# Patient Record
Sex: Female | Born: 1945 | Race: White | Hispanic: No | Smoking: Former smoker
Health system: Southern US, Community
[De-identification: ages and names within clinical notes are randomized; demographics above are authoritative.]

## PROBLEM LIST (undated history)

## (undated) DIAGNOSIS — J383 Other diseases of vocal cords: Secondary | ICD-10-CM

## (undated) DIAGNOSIS — L97919 Non-pressure chronic ulcer of unspecified part of right lower leg with unspecified severity: Secondary | ICD-10-CM

## (undated) DIAGNOSIS — D219 Benign neoplasm of connective and other soft tissue, unspecified: Secondary | ICD-10-CM

## (undated) DIAGNOSIS — L97929 Non-pressure chronic ulcer of unspecified part of left lower leg with unspecified severity: Secondary | ICD-10-CM

## (undated) DIAGNOSIS — IMO0002 Reserved for concepts with insufficient information to code with codable children: Secondary | ICD-10-CM

## (undated) DIAGNOSIS — J9610 Chronic respiratory failure, unspecified whether with hypoxia or hypercapnia: Secondary | ICD-10-CM

## (undated) DIAGNOSIS — IMO0001 Reserved for inherently not codable concepts without codable children: Secondary | ICD-10-CM

## (undated) DIAGNOSIS — D069 Carcinoma in situ of cervix, unspecified: Secondary | ICD-10-CM

## (undated) DIAGNOSIS — J449 Chronic obstructive pulmonary disease, unspecified: Secondary | ICD-10-CM

## (undated) DIAGNOSIS — F329 Major depressive disorder, single episode, unspecified: Secondary | ICD-10-CM

## (undated) DIAGNOSIS — F32A Depression, unspecified: Secondary | ICD-10-CM

## (undated) DIAGNOSIS — D649 Anemia, unspecified: Secondary | ICD-10-CM

## (undated) DIAGNOSIS — E785 Hyperlipidemia, unspecified: Secondary | ICD-10-CM

## (undated) DIAGNOSIS — D638 Anemia in other chronic diseases classified elsewhere: Secondary | ICD-10-CM

## (undated) DIAGNOSIS — I2510 Atherosclerotic heart disease of native coronary artery without angina pectoris: Secondary | ICD-10-CM

## (undated) DIAGNOSIS — C50919 Malignant neoplasm of unspecified site of unspecified female breast: Secondary | ICD-10-CM

## (undated) DIAGNOSIS — Z87891 Personal history of nicotine dependence: Secondary | ICD-10-CM

## (undated) DIAGNOSIS — N184 Chronic kidney disease, stage 4 (severe): Secondary | ICD-10-CM

## (undated) DIAGNOSIS — E119 Type 2 diabetes mellitus without complications: Secondary | ICD-10-CM

## (undated) DIAGNOSIS — F0390 Unspecified dementia without behavioral disturbance: Secondary | ICD-10-CM

## (undated) DIAGNOSIS — I5032 Chronic diastolic (congestive) heart failure: Secondary | ICD-10-CM

## (undated) HISTORY — DX: Malignant neoplasm of unspecified site of unspecified female breast: C50.919

## (undated) HISTORY — DX: Non-pressure chronic ulcer of unspecified part of left lower leg with unspecified severity: L97.929

## (undated) HISTORY — DX: Reserved for concepts with insufficient information to code with codable children: IMO0002

## (undated) HISTORY — DX: Carcinoma in situ of cervix, unspecified: D06.9

## (undated) HISTORY — DX: Anemia, unspecified: D64.9

## (undated) HISTORY — DX: Hyperlipidemia, unspecified: E78.5

## (undated) HISTORY — DX: Reserved for inherently not codable concepts without codable children: IMO0001

## (undated) HISTORY — DX: Morbid (severe) obesity due to excess calories: E66.01

## (undated) HISTORY — DX: Non-pressure chronic ulcer of unspecified part of left lower leg with unspecified severity: L97.919

---

## 2001-08-25 ENCOUNTER — Encounter: Payer: Self-pay | Admitting: Emergency Medicine

## 2001-08-25 ENCOUNTER — Emergency Department (HOSPITAL_COMMUNITY): Admission: EM | Admit: 2001-08-25 | Discharge: 2001-08-25 | Payer: Self-pay | Admitting: Emergency Medicine

## 2001-10-15 ENCOUNTER — Encounter: Admission: RE | Admit: 2001-10-15 | Discharge: 2001-11-05 | Payer: Self-pay | Admitting: Orthopedic Surgery

## 2006-12-02 ENCOUNTER — Emergency Department (HOSPITAL_COMMUNITY): Admission: EM | Admit: 2006-12-02 | Discharge: 2006-12-02 | Payer: Self-pay | Admitting: Emergency Medicine

## 2011-05-01 ENCOUNTER — Inpatient Hospital Stay (HOSPITAL_COMMUNITY): Payer: Self-pay

## 2011-05-01 ENCOUNTER — Inpatient Hospital Stay (HOSPITAL_COMMUNITY)
Admission: AD | Admit: 2011-05-01 | Discharge: 2011-05-01 | Disposition: A | Discharge status: Home / Self Care | Payer: Self-pay | Source: Ambulatory Visit | Attending: Obstetrics & Gynecology | Admitting: Obstetrics & Gynecology

## 2011-05-01 DIAGNOSIS — N9489 Other specified conditions associated with female genital organs and menstrual cycle: Secondary | ICD-10-CM

## 2011-05-01 DIAGNOSIS — N888 Other specified noninflammatory disorders of cervix uteri: Secondary | ICD-10-CM | POA: Insufficient documentation

## 2011-05-01 DIAGNOSIS — N950 Postmenopausal bleeding: Secondary | ICD-10-CM | POA: Insufficient documentation

## 2011-05-01 DIAGNOSIS — R9389 Abnormal findings on diagnostic imaging of other specified body structures: Secondary | ICD-10-CM | POA: Insufficient documentation

## 2011-05-01 DIAGNOSIS — D259 Leiomyoma of uterus, unspecified: Secondary | ICD-10-CM | POA: Insufficient documentation

## 2011-05-01 DIAGNOSIS — N858 Other specified noninflammatory disorders of uterus: Secondary | ICD-10-CM

## 2011-05-01 DIAGNOSIS — N838 Other noninflammatory disorders of ovary, fallopian tube and broad ligament: Secondary | ICD-10-CM | POA: Insufficient documentation

## 2011-05-01 LAB — CBC
HCT: 39.2 % (ref 36.0–46.0)
Hemoglobin: 12.9 g/dL (ref 12.0–15.0)
MCH: 27 pg (ref 26.0–34.0)
MCHC: 32.9 g/dL (ref 30.0–36.0)
MCV: 82 fL (ref 78.0–100.0)
Platelets: 271 10*3/uL (ref 150–400)
RBC: 4.78 MIL/uL (ref 3.87–5.11)
RDW: 15.5 % (ref 11.5–15.5)
WBC: 12 10*3/uL — ABNORMAL HIGH (ref 4.0–10.5)

## 2011-05-01 NOTE — Progress Notes (Signed)
Pt states she woke up in a pool of blood

## 2011-05-01 NOTE — ED Provider Notes (Signed)
History   The pt is a 65 y o female who presents to [facility]U reporting post-menopausal bleeding. She is accompanied by her daughter. The pt is a poor historian and has received limited healthcare in the past ~13 years. She lives with her husband. Her daughter estimates that the pts last gyn exam/Pap was 13 years ago. The pt states she multiple abnl Paps, but does not know what was done about them.  CSN: [id number] Arrival date & time: 05/01/2011  1:02 AM  Chief Complaint  Patient presents with  . Vaginal Bleeding   Vaginal Bleeding The patient's primary symptoms include vaginal bleeding. The patient's pertinent negatives include no pelvic pain. This is a new problem. Episode onset: woke up in a pool of blood 2 days ago, spotting  2 months ago. The problem has been waxing and waning. The patient is experiencing no pain. She is not pregnant. Pertinent negatives include no abdominal pain, chills, constipation, diarrhea, dysuria, fever, frequency, nausea, urgency or vomiting. The vaginal bleeding is heavier than menses. She has been passing clots. She has not been passing tissue. The symptoms are aggravated by nothing. She has tried nothing for the symptoms. She is not sexually active. It is unknown whether or not her partner has an STD. Contraceptive Use: post-menopausal. She is postmenopausal. Her past medical history is significant for a gynecological surgery (endometrial polyps).    No past medical history on file.  No past surgical history on file.  No family history on file.  Meds: none  History  Substance Use Topics  . Smoking status: Not on file  . Smokeless tobacco: Not on file  . Alcohol Use: Not on file    OB History    No data available      Review of Systems  Constitutional: Positive for unexpected weight change (gain). Negative for fever, chills, appetite change and fatigue.  Respiratory: Negative for cough, chest tightness and shortness of breath.   Cardiovascular: Negative  for palpitations.  Gastrointestinal: Negative for nausea, vomiting, abdominal pain, diarrhea, constipation, blood in stool, abdominal distention and anal bleeding.  Genitourinary: Positive for vaginal bleeding. Negative for dysuria, urgency, frequency, difficulty urinating, vaginal pain and pelvic pain.  Neurological: Negative for syncope and weakness.  Hematological: Does not bruise/bleed easily.    Physical Exam  BP 151/78  Pulse 92  Temp(Src) 98.4 F (36.9 C) (Oral)  Resp 18  Ht 5\' 2"  (1.575 m)  Wt 96.616 kg (213 lb)  BMI 38.96 kg/m2 Patient Vitals for the past 24 hrs:  BP Temp Temp src Pulse Resp Height Weight  05/01/11 0310 134/66 mmHg - - 99  20  - -  05/01/11 0307 137/74 mmHg - - 91  - - -  05/01/11 0305 148/63 mmHg - - 86  - - -  05/01/11 0150 151/78 mmHg - - 92  18  - -  05/01/11 0122 - 98.4 F (36.9 C) Oral - 20  5\' 2"  (1.575 m) 96.616 kg (213 lb)    Physical Exam  Constitutional: Vital signs are normal. She appears well-developed and well-nourished. No distress.       Poor hygene, disheveled appearance.   HENT:  Head: Normocephalic and atraumatic.  Cardiovascular: Normal rate.   Pulmonary/Chest: Effort normal.  Abdominal: Soft. She exhibits distension (mildly) and mass (possible low-abd mass. Difficult to assess due to body habitus.). There is no tenderness.  Genitourinary: Rectal exam shows no external hemorrhoid.    Uterus is enlarged (possibly--difficult to assess due to body  habitus) and fixed. Uterus is not tender. Cervix exhibits no motion tenderness. Right adnexum displays no tenderness and no fullness. Left adnexum displays no mass and no tenderness. There is bleeding (mod brb) around the vagina.       Multiple masses possibly arising from the cervix. One cystic-appearing mass.  Neurological: She is alert.  Skin: Skin is warm and dry.  Psychiatric: Cognition and memory are impaired.   *RN observed large gush of blood when pt stood after [place].  CBC      Component Value Date/Time   WBC 12.0* 05/01/2011 0135   RBC 4.78 05/01/2011 0135   HGB 12.9 05/01/2011 0135   HCT 39.2 05/01/2011 0135   PLT 271 05/01/2011 0135   MCV 82.0 05/01/2011 0135   MCH 27.0 05/01/2011 0135   MCHC 32.9 05/01/2011 0135   RDW 15.5 05/01/2011 0135    *RADIOLOGY REPORT*  Clinical Data: Post menopausal bleeding  TRANSABDOMINAL AND TRANSVAGINAL ULTRASOUND OF PELVIS  Technique: Both transabdominal and transvaginal ultrasound  examinations of the pelvis were performed. Transabdominal technique  was performed for global imaging of the pelvis including uterus,  ovaries, adnexal regions, and pelvic cul-de-sac.  Comparison: None.  It was necessary to proceed with endovaginal exam following the  transabdominal exam to visualize the uterus and ovaries.  Findings:  Uterus: The uterus measures 6.5 x 4.9 x 4.5 cm. Heterogeneous  myometrial echotexture with myometrial masses demonstrated  consistent with fibroids. Small calcifications are present. The  largest of these is on the left and measures about 1.7 x 1.5 x 1.7  cm. The uterus appears retroverted. The cervix is not well  visualized.  Endometrium: Endometrial stripe thickness is mildly prominent,  measuring about 1.2 cm. However, the endometrium is heterogeneous  with multiple cystic and solid regions demonstrated. Endometrial  flow is demonstrated on color flow Doppler images. The central  solid mass measuring about 1.1 cm diameter is suggested. Features  are worrisome for endometrial hyperplasia, polyp, or neoplasm.  Right ovary: The right ovary measures about 3.5 x 2 x 2.2 cm and  contains a simple appearing cyst measuring about 1.9 x 1.9 x 1.5  cm. Flow is demonstrated in the right ovary on color flow Doppler  images.  Inferior to the right ovary, there is a complex hypoechoic cystic  structure measuring about 1.8 x 1.5 x 1.7 cm. No flow is  demonstrated on color flow Doppler imaging. This does not appear  to be related to  the uterus or ovary and it is of uncertain  origin.It appears represent a complex or hemorrhagic cyst although  it represent enlarged lymphnode.  Left ovary: The left ovary is not visualized.  Other findings: Trace fluid in the pelvis and right adnexal region.  IMPRESSION:  1. Multiple myometrial leiomyomas of the uterus.  2. Heterogeneous appearing endometrium with cystic and solid  structures present. Changes are worrisome for endometrial  hyperplasia, polyp, or neoplasm.  3. Simple appearing cyst in the right ovary.  4. Complex hypoechoic structure inferior to the right adnexa of  uncertain etiology or origin.  5. Left ovary not visualized.  6. Trace free fluid in the pelvis.  Original Report Authenticated By: Marlon Pel, M.D.  ED Course  Procedures  Assessment: 1. Post-menopausal bleeding, hemodynamically stable 2. Cervical masses  3. Endometrial masses worrisome for endometrial  hyperplasia, polyp, or neoplasm.  4. Fibroids 5. Right adnexal mass  Plan: 1. D/C home per consult w/ Dr. Macon Large 2. Will schedule F/U ASAP in  Gyn clinic 3. Discussed findings w/ pt and daughter including concern for possible cancer. Stressed need to F/U AS 4. Return to MAU PRN for symptomatic heavy bleeding.

## 2011-05-07 NOTE — ED Provider Notes (Signed)
Attestation of Attending Supervision of Advanced Practitioner: Evaluation and management procedures were performed by the PA/NP/CNM/OB Fellow under my supervision/collaboration. Chart reviewed and agree with management and plan.  Amillion Macchia A 05/07/2011 9:48 PM   

## 2011-05-30 ENCOUNTER — Other Ambulatory Visit (HOSPITAL_COMMUNITY)
Admission: RE | Admit: 2011-05-30 | Discharge: 2011-05-30 | Disposition: A | Discharge status: Home / Self Care | Payer: Self-pay | Source: Ambulatory Visit | Attending: Obstetrics and Gynecology | Admitting: Obstetrics and Gynecology

## 2011-05-30 ENCOUNTER — Other Ambulatory Visit: Payer: Self-pay | Admitting: Obstetrics and Gynecology

## 2011-05-30 ENCOUNTER — Encounter: Payer: Self-pay | Admitting: Obstetrics and Gynecology

## 2011-05-30 ENCOUNTER — Encounter (HOSPITAL_COMMUNITY): Payer: Self-pay | Admitting: *Deleted

## 2011-05-30 ENCOUNTER — Ambulatory Visit (INDEPENDENT_AMBULATORY_CARE_PROVIDER_SITE_OTHER): Payer: Self-pay | Admitting: Obstetrics and Gynecology

## 2011-05-30 ENCOUNTER — Inpatient Hospital Stay (HOSPITAL_COMMUNITY)
Admission: AD | Admit: 2011-05-30 | Discharge: 2011-05-30 | Disposition: A | Discharge status: Home / Self Care | Payer: Self-pay | Source: Ambulatory Visit | Attending: Obstetrics & Gynecology | Admitting: Obstetrics & Gynecology

## 2011-05-30 VITALS — BP 142/82 | HR 76 | Temp 97.2°F | Ht 62.0 in | Wt 207.2 lb

## 2011-05-30 DIAGNOSIS — N898 Other specified noninflammatory disorders of vagina: Secondary | ICD-10-CM

## 2011-05-30 DIAGNOSIS — N949 Unspecified condition associated with female genital organs and menstrual cycle: Secondary | ICD-10-CM | POA: Insufficient documentation

## 2011-05-30 DIAGNOSIS — N938 Other specified abnormal uterine and vaginal bleeding: Secondary | ICD-10-CM | POA: Insufficient documentation

## 2011-05-30 DIAGNOSIS — N939 Abnormal uterine and vaginal bleeding, unspecified: Secondary | ICD-10-CM

## 2011-05-30 DIAGNOSIS — Z01419 Encounter for gynecological examination (general) (routine) without abnormal findings: Secondary | ICD-10-CM

## 2011-05-30 DIAGNOSIS — D069 Carcinoma in situ of cervix, unspecified: Secondary | ICD-10-CM | POA: Insufficient documentation

## 2011-05-30 DIAGNOSIS — N950 Postmenopausal bleeding: Secondary | ICD-10-CM

## 2011-05-30 HISTORY — DX: Benign neoplasm of connective and other soft tissue, unspecified: D21.9

## 2011-05-30 LAB — CBC
HCT: 37.1 % (ref 36.0–46.0)
MCHC: 32.1 g/dL (ref 30.0–36.0)
MCV: 81.2 fL (ref 78.0–100.0)
RDW: 15.8 % — ABNORMAL HIGH (ref 11.5–15.5)

## 2011-05-30 LAB — COMPREHENSIVE METABOLIC PANEL
Albumin: 3.6 g/dL (ref 3.5–5.2)
BUN: 23 mg/dL (ref 6–23)
Calcium: 9.4 mg/dL (ref 8.4–10.5)
Creatinine, Ser: 1.02 mg/dL (ref 0.50–1.10)
Total Bilirubin: 0.2 mg/dL — ABNORMAL LOW (ref 0.3–1.2)
Total Protein: 7 g/dL (ref 6.0–8.3)

## 2011-05-30 LAB — POCT URINALYSIS DIP (DEVICE)
Ketones, ur: NEGATIVE mg/dL
Protein, ur: NEGATIVE mg/dL
Specific Gravity, Urine: <=1.005 (ref 1.005–1.030)
pH: 5 (ref 5.0–8.0)

## 2011-05-30 MED ORDER — LACTATED RINGERS IV SOLN
INTRAVENOUS | Status: DC
  Administered 2011-05-30: 18:00:00 via INTRAVENOUS

## 2011-05-30 NOTE — ED Provider Notes (Signed)
Patient was seen by me.  Above note is noted.  All scans and labs and previous notes were reviewed.  On exam, it appears to me that patient has endometrial carcinoma prolapsing through the cervix.  The tissue looks worse than simply a necrotic polyp or myoma.  Additionally the cervix itself looks fine just dilated due to the prolapsing masses.  Dr. Okey Dupre obtained sample in clinic and sent off to pathology.    Hemostasis was obtained using a large amount of Monsel's solution and pressure which stopped the bleeding.  I let patient sit up for 30 minutes and she had no further bleeding.  As a result patient will be discharged home to be seen in Gyn clinic here at [facility] on Monday 06/04/2011 at 1:30 PM to go over biopsy report.  I informed patient i think it is probably a malignancy and discussed options if that is the case.  She understands.

## 2011-05-30 NOTE — ED Provider Notes (Signed)
History     CSN: [id number] Arrival date & time: 05/30/2011  5:06 PM  No chief complaint on file.   HPI Anita [name] is a 65 y.o. female sent to MAU from GYN clinic for heavy vaginal bleeding after Dr. Okey Dupre was doing a endometrial biopsy. She is in stable condition.  No past medical history on file.  No past surgical history on file.  Family History  Problem Relation Age of Onset  . Cancer Mother     leukemia  . Hypertension Mother   . Diabetes Father   . Heart disease Father     passed away due to heart attack    History  Substance Use Topics  . Smoking status: Current Everyday Smoker -- 1.0 packs/day for 25 years    Types: Cigarettes  . Smokeless tobacco: Never Used  . Alcohol Use: No    OB History    Grav Para Term Preterm Abortions TAB SAB Ect Mult Living                  Review of Systems  Constitutional: Negative for fever, chills, diaphoresis and fatigue.  HENT: Negative for ear pain, congestion, sore throat, facial swelling, neck pain, neck stiffness, dental problem and sinus pressure.   Eyes: Negative for photophobia, pain and discharge.  Respiratory: Positive for shortness of breath. Negative for cough, chest tightness and wheezing.   Cardiovascular: Positive for leg swelling.  Gastrointestinal: Positive for abdominal pain. Negative for nausea, vomiting, diarrhea, constipation and abdominal distention.  Genitourinary: Positive for vaginal bleeding. Negative for dysuria, frequency, flank pain and difficulty urinating.  Musculoskeletal: Positive for back pain. Negative for myalgias and gait problem.  Skin: Negative for color change and rash.  Neurological: Negative for dizziness, speech difficulty, weakness, light-headedness, numbness and headaches.  Psychiatric/Behavioral: Negative for confusion and agitation.    Allergies  Review of patient's allergies indicates no known allergies.  Home Medications  No current outpatient prescriptions on  file.  There were no vitals taken for this visit.  Physical Exam  Nursing note and vitals reviewed. Constitutional: She is oriented to person, place, and time. She appears well-developed and well-nourished. No distress.  HENT:  Head: Normocephalic.  Eyes: EOM are normal.  Neck: Neck supple.  Pulmonary/Chest: No respiratory distress.       rhonchi bilateral  Abdominal: Soft. There is no tenderness.       obese  Musculoskeletal: Normal range of motion.  Neurological: She is alert and oriented to person, place, and time. No cranial nerve deficit.  Skin: Skin is warm and dry.    ED Course  Procedures   MDM   Results for orders placed during the hospital encounter of 05/30/11 (from the past 24 hour(s))  CBC     Status: Abnormal   Collection Time   05/30/11  5:12 PM      Component Value Range   WBC 8.9  4.0 - 10.5 (K/uL)   RBC 4.57  3.87 - 5.11 (MIL/uL)   Hemoglobin 11.9 (*) 12.0 - 15.0 (g/dL)   HCT 40.9  81.1 - 91.4 (%)   MCV 81.2  78.0 - 100.0 (fL)   MCH 26.0  26.0 - 34.0 (pg)   MCHC 32.1  30.0 - 36.0 (g/dL)   RDW 78.2 (*) 95.6 - 15.5 (%)   Platelets 368  150 - 400 (K/uL)  COMPREHENSIVE METABOLIC PANEL     Status: Abnormal   Collection Time   05/30/11  5:12 PM  Component Value Range   Sodium 137  135 - 145 (mEq/L)   Potassium 3.7  3.5 - 5.1 (mEq/L)   Chloride 101  96 - 112 (mEq/L)   CO2 29  19 - 32 (mEq/L)   Glucose, Bld 115 (*) 70 - 99 (mg/dL)   BUN 23  6 - 23 (mg/dL)   Creatinine, Ser 1.61  0.50 - 1.10 (mg/dL)   Calcium 9.4  8.4 - 09.6 (mg/dL)   Total Protein 7.0  6.0 - 8.3 (g/dL)   Albumin 3.6  3.5 - 5.2 (g/dL)   AST 18  0 - 37 (U/L)   ALT 15  0 - 35 (U/L)   Alkaline Phosphatase 98  39 - 117 (U/L)   Total Bilirubin 0.2 (*) 0.3 - 1.2 (mg/dL)   GFR calc non Af Amer 57 (*) >90 (mL/min)   GFR calc Af Amer 66 (*) >90 (mL/min)   Assessment: Abnormal vaginal bleeding Plan: Dr. Despina Hidden notified that patient's labs are back and she is ready for further  evaluation.     [place], [place] 05/30/11 [phone]

## 2011-05-30 NOTE — ED Notes (Signed)
Brought up from clinic via wc. Fibroid removed in clinic- was coming through cervix,started bleeding heavily. Registered and taken to rm.

## 2011-05-30 NOTE — Progress Notes (Signed)
The patient is a 65 year old white female with no bleeding for 10 years wet started bleeding vaginally on Saturday past went into the emergency room on Sunday and was referred here to be seen today. The visit in emergency room was not to revealing except for an ultrasound showed a normal-size uterus with a thickened endometrium and some small submucous fibroid. She stopped bleeding yesterday. Patient apparently had history of several abnormal Pap smears but hasn't had one in about 10 years.  Examination: On inserting the speculum at first I thought she had a complete uterine prolapse but then it appeared that this was either an aborting fibroid or a polyp measuring about 4 cm in diameter. Palpating I could feel the entrance to the cervix above this polyp was therefore on the posterior entrance into the cervix and the cervix appeared to be dilated bleeding was getting heavy at this time so I removed the polyp by torsion painlessly. It measured about 3.5 x 3.5 x 1 cm. Was then noted that the bleeding continued and that there was another mass in the area so rather than continue this here in the clinic I packed the vagina speculum. Dr. Algis Downs. that was notified that the patient should be admitted for [facility] and removal of endomass.

## 2011-05-30 NOTE — Progress Notes (Signed)
Pt is not bleeding currently and does not want to do endo bx if could cause bleeding to start again.

## 2011-06-22 ENCOUNTER — Ambulatory Visit (INDEPENDENT_AMBULATORY_CARE_PROVIDER_SITE_OTHER): Payer: Self-pay | Admitting: Obstetrics and Gynecology

## 2011-06-22 ENCOUNTER — Encounter: Payer: Self-pay | Admitting: Obstetrics and Gynecology

## 2011-06-22 VITALS — BP 157/88 | HR 89 | Temp 96.8°F | Wt 206.6 lb

## 2011-06-22 DIAGNOSIS — D069 Carcinoma in situ of cervix, unspecified: Secondary | ICD-10-CM

## 2011-06-22 DIAGNOSIS — N871 Moderate cervical dysplasia: Secondary | ICD-10-CM

## 2011-06-22 NOTE — Progress Notes (Signed)
Patient is a 65 year old white female gravida 2 para 200 to. Proximately 10-12 years postmenopausal who came in with postmenopausal bleeding 2 weeks ago. On examination I saw a large cervical mass as well as 2 small or masses. These appeared to be aborting fibroids or endocervical polyps. I did an endometrial biopsy and a Pap smear both of which came back normal. 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 Bleeding ensued which were unable to control here I sent her to the [facility]. and Dr. Bridgette Habermann was able to control it with Monsel solution. He returns today for further examination.:  Examination: Speculum: 2 remaining cervical masses remaining. One measures about 2-1/2-3 cm x 2 cm. A second measures about 1.5 x 1.5 cm.  Plan: Have the patient return as soon as possible for surgical consult for D&C and removal of cervical lesions.  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 2 remaining cervical masses still present.

## 2011-08-23 ENCOUNTER — Ambulatory Visit: Payer: Self-pay | Admitting: Obstetrics and Gynecology

## 2011-10-10 ENCOUNTER — Ambulatory Visit: Payer: Self-pay | Admitting: Obstetrics & Gynecology

## 2011-10-24 ENCOUNTER — Ambulatory Visit: Payer: Self-pay | Admitting: Obstetrics & Gynecology

## 2011-11-07 ENCOUNTER — Encounter: Payer: Self-pay | Admitting: Obstetrics & Gynecology

## 2011-11-07 ENCOUNTER — Ambulatory Visit (INDEPENDENT_AMBULATORY_CARE_PROVIDER_SITE_OTHER): Payer: Self-pay | Admitting: Obstetrics & Gynecology

## 2011-11-07 VITALS — BP 157/92 | HR 83 | Temp 96.8°F | Resp 20 | Ht 60.5 in | Wt 205.7 lb

## 2011-11-07 DIAGNOSIS — D069 Carcinoma in situ of cervix, unspecified: Secondary | ICD-10-CM

## 2011-11-07 NOTE — Progress Notes (Signed)
  Subjective:    Patient ID: Anita [name], female    DOB: [birth year], 66 y.o.   MRN: [id number]  HPI  Anita [name] is a 66 yo lady who was seen here in the clinic 10/12. Dr. Okey [name] removed what appeared to be a prolapsing fibroid and had hemorrhage. She was sent to the MAU and was treated with Monsels. The pathology came back as CIN 3, although her pap that day was normal. She never came back for another appt until now.   Review of Systems     Objective:   Physical Exam  Prolapsing masses form cervical os, appearing very friable/vascular      Assessment & Plan:  Although she initially declined surgery, I have convinced her that her risk of not having surgery is greater than her fear of surgery/$. 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 She understands that any delay can worsen her prognosis.

## 2011-11-07 NOTE — Progress Notes (Signed)
Pt would like to discuss surgical alternatives.

## 2011-11-28 ENCOUNTER — Telehealth: Payer: Self-pay | Admitting: *Deleted

## 2011-11-28 NOTE — Telephone Encounter (Signed)
Pt left message on nurse voice mail for Dr. Marice Potter that she has had a second opinion and is cancelling her surgery on 5/15. She further stated that "Y'all are too quick to have someone go under the knife. I don't want to go under no one's knife that fast. I'm just going to wait and chill for awhile." Message will be sent to Dr. Marice Potter regarding pt's desire to cancel surgery.

## 2011-11-29 NOTE — Telephone Encounter (Signed)
After hearing from Diane Day that Anita [name] canceled her surgery, I called the patient and left the patient a message saying that any delay in treatment can shorten her life. I also told her I would be happy to reschedule her at her convenience.

## 2012-01-09 ENCOUNTER — Ambulatory Visit: Admit: 2012-01-09 | Payer: Self-pay | Admitting: Obstetrics & Gynecology

## 2012-01-09 SURGERY — CONE BIOPSY, CERVIX
Anesthesia: Choice | Site: Vagina

## 2012-01-09 SURGERY — Surgical Case
Anesthesia: *Unknown

## 2012-09-24 ENCOUNTER — Emergency Department (HOSPITAL_COMMUNITY): Payer: Medicare Other

## 2012-09-24 ENCOUNTER — Inpatient Hospital Stay (HOSPITAL_COMMUNITY): Payer: Medicare Other

## 2012-09-24 ENCOUNTER — Encounter (HOSPITAL_COMMUNITY): Payer: Self-pay | Admitting: Emergency Medicine

## 2012-09-24 ENCOUNTER — Inpatient Hospital Stay (HOSPITAL_COMMUNITY)
Admission: EM | Admit: 2012-09-24 | Discharge: 2012-10-02 | DRG: 280 | Disposition: A | Discharge status: Home / Self Care | Payer: Medicare Other | Attending: Family Medicine | Admitting: Family Medicine

## 2012-09-24 DIAGNOSIS — R609 Edema, unspecified: Secondary | ICD-10-CM

## 2012-09-24 DIAGNOSIS — Z6841 Body Mass Index (BMI) 40.0 and over, adult: Secondary | ICD-10-CM

## 2012-09-24 DIAGNOSIS — D649 Anemia, unspecified: Secondary | ICD-10-CM | POA: Diagnosis present

## 2012-09-24 DIAGNOSIS — I2789 Other specified pulmonary heart diseases: Secondary | ICD-10-CM | POA: Diagnosis present

## 2012-09-24 DIAGNOSIS — J441 Chronic obstructive pulmonary disease with (acute) exacerbation: Secondary | ICD-10-CM

## 2012-09-24 DIAGNOSIS — N189 Chronic kidney disease, unspecified: Secondary | ICD-10-CM | POA: Diagnosis present

## 2012-09-24 DIAGNOSIS — L03319 Cellulitis of trunk, unspecified: Secondary | ICD-10-CM | POA: Diagnosis present

## 2012-09-24 DIAGNOSIS — Z22322 Carrier or suspected carrier of Methicillin resistant Staphylococcus aureus: Secondary | ICD-10-CM

## 2012-09-24 DIAGNOSIS — K7689 Other specified diseases of liver: Secondary | ICD-10-CM | POA: Diagnosis present

## 2012-09-24 DIAGNOSIS — N179 Acute kidney failure, unspecified: Secondary | ICD-10-CM

## 2012-09-24 DIAGNOSIS — Z720 Tobacco use: Secondary | ICD-10-CM

## 2012-09-24 DIAGNOSIS — J811 Chronic pulmonary edema: Secondary | ICD-10-CM

## 2012-09-24 DIAGNOSIS — L97309 Non-pressure chronic ulcer of unspecified ankle with unspecified severity: Secondary | ICD-10-CM

## 2012-09-24 DIAGNOSIS — R601 Generalized edema: Secondary | ICD-10-CM

## 2012-09-24 DIAGNOSIS — I5033 Acute on chronic diastolic (congestive) heart failure: Secondary | ICD-10-CM | POA: Diagnosis present

## 2012-09-24 DIAGNOSIS — R4182 Altered mental status, unspecified: Secondary | ICD-10-CM

## 2012-09-24 DIAGNOSIS — L02219 Cutaneous abscess of trunk, unspecified: Secondary | ICD-10-CM | POA: Diagnosis present

## 2012-09-24 DIAGNOSIS — R7989 Other specified abnormal findings of blood chemistry: Secondary | ICD-10-CM

## 2012-09-24 DIAGNOSIS — F17200 Nicotine dependence, unspecified, uncomplicated: Secondary | ICD-10-CM | POA: Diagnosis present

## 2012-09-24 DIAGNOSIS — J9600 Acute respiratory failure, unspecified whether with hypoxia or hypercapnia: Secondary | ICD-10-CM

## 2012-09-24 DIAGNOSIS — R0989 Other specified symptoms and signs involving the circulatory and respiratory systems: Secondary | ICD-10-CM

## 2012-09-24 DIAGNOSIS — E662 Morbid (severe) obesity with alveolar hypoventilation: Secondary | ICD-10-CM

## 2012-09-24 DIAGNOSIS — G4733 Obstructive sleep apnea (adult) (pediatric): Secondary | ICD-10-CM | POA: Diagnosis present

## 2012-09-24 DIAGNOSIS — D72829 Elevated white blood cell count, unspecified: Secondary | ICD-10-CM | POA: Diagnosis not present

## 2012-09-24 DIAGNOSIS — R0902 Hypoxemia: Secondary | ICD-10-CM

## 2012-09-24 DIAGNOSIS — IMO0002 Reserved for concepts with insufficient information to code with codable children: Secondary | ICD-10-CM

## 2012-09-24 DIAGNOSIS — I219 Acute myocardial infarction, unspecified: Secondary | ICD-10-CM

## 2012-09-24 DIAGNOSIS — E119 Type 2 diabetes mellitus without complications: Secondary | ICD-10-CM

## 2012-09-24 DIAGNOSIS — E876 Hypokalemia: Secondary | ICD-10-CM | POA: Diagnosis not present

## 2012-09-24 DIAGNOSIS — J9620 Acute and chronic respiratory failure, unspecified whether with hypoxia or hypercapnia: Secondary | ICD-10-CM

## 2012-09-24 DIAGNOSIS — I509 Heart failure, unspecified: Secondary | ICD-10-CM

## 2012-09-24 DIAGNOSIS — J9692 Respiratory failure, unspecified with hypercapnia: Secondary | ICD-10-CM

## 2012-09-24 DIAGNOSIS — L97909 Non-pressure chronic ulcer of unspecified part of unspecified lower leg with unspecified severity: Secondary | ICD-10-CM | POA: Diagnosis present

## 2012-09-24 DIAGNOSIS — I959 Hypotension, unspecified: Secondary | ICD-10-CM

## 2012-09-24 DIAGNOSIS — N63 Unspecified lump in unspecified breast: Secondary | ICD-10-CM | POA: Diagnosis present

## 2012-09-24 DIAGNOSIS — J449 Chronic obstructive pulmonary disease, unspecified: Secondary | ICD-10-CM

## 2012-09-24 DIAGNOSIS — I214 Non-ST elevation (NSTEMI) myocardial infarction: Principal | ICD-10-CM

## 2012-09-24 HISTORY — DX: Chronic obstructive pulmonary disease, unspecified: J44.9

## 2012-09-24 LAB — CBC WITH DIFFERENTIAL/PLATELET
Basophils Absolute: 0 10*3/uL (ref 0.0–0.1)
Basophils Relative: 0 % (ref 0–1)
Eosinophils Absolute: 0 10*3/uL (ref 0.0–0.7)
Eosinophils Relative: 0 % (ref 0–5)
HCT: 30.2 % — ABNORMAL LOW (ref 36.0–46.0)
Hemoglobin: 8.9 g/dL — ABNORMAL LOW (ref 12.0–15.0)
Lymphocytes Relative: 10 % — ABNORMAL LOW (ref 12–46)
Lymphs Abs: 0.9 10*3/uL (ref 0.7–4.0)
MCH: 25.6 pg — ABNORMAL LOW (ref 26.0–34.0)
MCHC: 29.5 g/dL — ABNORMAL LOW (ref 30.0–36.0)
MCV: 87 fL (ref 78.0–100.0)
Monocytes Absolute: 0.5 10*3/uL (ref 0.1–1.0)
Monocytes Relative: 6 % (ref 3–12)
Neutro Abs: 7.6 10*3/uL (ref 1.7–7.7)
Neutrophils Relative %: 84 % — ABNORMAL HIGH (ref 43–77)
Platelets: 172 10*3/uL (ref 150–400)
RBC: 3.47 MIL/uL — ABNORMAL LOW (ref 3.87–5.11)
RDW: 25.2 % — ABNORMAL HIGH (ref 11.5–15.5)
WBC: 9 10*3/uL (ref 4.0–10.5)

## 2012-09-24 LAB — POCT I-STAT 3, ART BLOOD GAS (G3+)
Acid-base deficit: 1 mmol/L (ref 0.0–2.0)
Acid-base deficit: 1 mmol/L (ref 0.0–2.0)
Bicarbonate: 27.4 mEq/L — ABNORMAL HIGH (ref 20.0–24.0)
Patient temperature: 99.2
TCO2: 29 mmol/L (ref 0–100)
pCO2 arterial: 46.9 mmHg — ABNORMAL HIGH (ref 35.0–45.0)
pH, Arterial: 7.329 — ABNORMAL LOW (ref 7.350–7.450)
pO2, Arterial: 89 mmHg (ref 80.0–100.0)

## 2012-09-24 LAB — COMPREHENSIVE METABOLIC PANEL
AST: 57 U/L — ABNORMAL HIGH (ref 0–37)
Albumin: 2.9 g/dL — ABNORMAL LOW (ref 3.5–5.2)
Alkaline Phosphatase: 238 U/L — ABNORMAL HIGH (ref 39–117)
BUN: 43 mg/dL — ABNORMAL HIGH (ref 6–23)
Chloride: 107 mEq/L (ref 96–112)
Potassium: 4.3 mEq/L (ref 3.5–5.1)
Total Bilirubin: 0.6 mg/dL (ref 0.3–1.2)

## 2012-09-24 LAB — MRSA PCR SCREENING: MRSA by PCR: POSITIVE — AB

## 2012-09-24 LAB — POCT I-STAT TROPONIN I

## 2012-09-24 LAB — PRO B NATRIURETIC PEPTIDE: Pro B Natriuretic peptide (BNP): 17719 pg/mL — ABNORMAL HIGH (ref 0–125)

## 2012-09-24 LAB — URINALYSIS, MICROSCOPIC ONLY
Glucose, UA: NEGATIVE mg/dL
Ketones, ur: NEGATIVE mg/dL
Protein, ur: 100 mg/dL — AB

## 2012-09-24 LAB — TROPONIN I: Troponin I: 2.45 ng/mL (ref <0.30)

## 2012-09-24 MED ORDER — HEPARIN (PORCINE) IN NACL 100-0.45 UNIT/ML-% IJ SOLN
1000.0000 [IU]/h | INTRAMUSCULAR | Status: DC
  Administered 2012-09-24: 1000 [IU]/h via INTRAVENOUS
  Filled 2012-09-24 (×2): qty 250

## 2012-09-24 MED ORDER — HEPARIN BOLUS VIA INFUSION
4000.0000 [IU] | Freq: Once | INTRAVENOUS | Status: AC
  Administered 2012-09-24: 4000 [IU] via INTRAVENOUS

## 2012-09-24 MED ORDER — ALBUTEROL SULFATE (5 MG/ML) 0.5% IN NEBU: 2.5000 mg | INHALATION_SOLUTION | RESPIRATORY_TRACT | Status: DC | PRN

## 2012-09-24 MED ORDER — CHLORHEXIDINE GLUCONATE 0.12 % MT SOLN
15.0000 mL | Freq: Two times a day (BID) | OROMUCOSAL | Status: DC
  Administered 2012-09-24 – 2012-10-01 (×13): 15 mL via OROMUCOSAL
  Filled 2012-09-24 (×16): qty 15

## 2012-09-24 MED ORDER — PREDNISONE 50 MG PO TABS
60.0000 mg | ORAL_TABLET | Freq: Every day | ORAL | Status: DC
  Administered 2012-09-25 – 2012-09-27 (×3): 60 mg via ORAL
  Filled 2012-09-24 (×4): qty 1

## 2012-09-24 MED ORDER — MUPIROCIN 2 % EX OINT
1.0000 "application " | TOPICAL_OINTMENT | Freq: Two times a day (BID) | CUTANEOUS | Status: AC
  Administered 2012-09-24 – 2012-09-29 (×9): 1 via NASAL
  Filled 2012-09-24: qty 22

## 2012-09-24 MED ORDER — BIOTENE DRY MOUTH MT LIQD
15.0000 mL | Freq: Two times a day (BID) | OROMUCOSAL | Status: DC
  Administered 2012-09-25 – 2012-10-02 (×14): 15 mL via OROMUCOSAL

## 2012-09-24 MED ORDER — ASPIRIN 81 MG PO CHEW
324.0000 mg | CHEWABLE_TABLET | Freq: Once | ORAL | Status: AC
  Administered 2012-09-24: 324 mg via ORAL
  Filled 2012-09-24: qty 4
  Filled 2012-09-24: qty 3

## 2012-09-24 MED ORDER — SODIUM CHLORIDE 0.9 % IJ SOLN
3.0000 mL | Freq: Two times a day (BID) | INTRAMUSCULAR | Status: DC
  Administered 2012-09-26 – 2012-09-29 (×5): 3 mL via INTRAVENOUS
  Administered 2012-09-29: 09:00:00 via INTRAVENOUS
  Administered 2012-09-30 – 2012-10-02 (×5): 3 mL via INTRAVENOUS

## 2012-09-24 MED ORDER — METHYLPREDNISOLONE SODIUM SUCC 125 MG IJ SOLR
125.0000 mg | Freq: Once | INTRAMUSCULAR | Status: AC
  Administered 2012-09-24: 125 mg via INTRAVENOUS
  Filled 2012-09-24: qty 2

## 2012-09-24 MED ORDER — CHLORHEXIDINE GLUCONATE CLOTH 2 % EX PADS
6.0000 | MEDICATED_PAD | Freq: Every day | CUTANEOUS | Status: AC
  Administered 2012-09-25 – 2012-09-29 (×5): 6 via TOPICAL

## 2012-09-24 MED ORDER — FUROSEMIDE 10 MG/ML IJ SOLN
80.0000 mg | Freq: Once | INTRAMUSCULAR | Status: AC
  Administered 2012-09-24: 80 mg via INTRAVENOUS
  Filled 2012-09-24: qty 8

## 2012-09-24 MED ORDER — ALBUTEROL SULFATE (5 MG/ML) 0.5% IN NEBU
2.5000 mg | INHALATION_SOLUTION | RESPIRATORY_TRACT | Status: DC
  Administered 2012-09-24 – 2012-09-25 (×4): 2.5 mg via RESPIRATORY_TRACT
  Filled 2012-09-24 (×4): qty 0.5

## 2012-09-24 MED ORDER — ACETAMINOPHEN 650 MG RE SUPP: 650.0000 mg | Freq: Four times a day (QID) | RECTAL | Status: DC | PRN

## 2012-09-24 MED ORDER — ASPIRIN EC 81 MG PO TBEC
81.0000 mg | DELAYED_RELEASE_TABLET | Freq: Every day | ORAL | Status: DC
  Administered 2012-09-26 – 2012-10-02 (×7): 81 mg via ORAL
  Filled 2012-09-24 (×8): qty 1

## 2012-09-24 MED ORDER — SODIUM CHLORIDE 0.9 % IJ SOLN: 3.0000 mL | INTRAMUSCULAR | Status: DC | PRN

## 2012-09-24 MED ORDER — FUROSEMIDE 10 MG/ML IJ SOLN
80.0000 mg | Freq: Two times a day (BID) | INTRAMUSCULAR | Status: DC
  Administered 2012-09-24: 80 mg via INTRAVENOUS
  Filled 2012-09-24 (×3): qty 8

## 2012-09-24 MED ORDER — LEVOFLOXACIN IN D5W 250 MG/50ML IV SOLN
250.0000 mg | INTRAVENOUS | Status: DC
  Filled 2012-09-24: qty 50

## 2012-09-24 MED ORDER — SODIUM CHLORIDE 0.9 % IJ SOLN
3.0000 mL | Freq: Two times a day (BID) | INTRAMUSCULAR | Status: DC
  Administered 2012-09-24 – 2012-09-26 (×4): 3 mL via INTRAVENOUS

## 2012-09-24 MED ORDER — SODIUM CHLORIDE 0.9 % IV SOLN
250.0000 mL | INTRAVENOUS | Status: DC | PRN
  Administered 2012-09-24: 250 mL via INTRAVENOUS

## 2012-09-24 MED ORDER — LEVOFLOXACIN IN D5W 500 MG/100ML IV SOLN
500.0000 mg | INTRAVENOUS | Status: AC
  Administered 2012-09-24: 500 mg via INTRAVENOUS
  Filled 2012-09-24: qty 100

## 2012-09-24 MED ORDER — IPRATROPIUM BROMIDE 0.02 % IN SOLN
0.5000 mg | RESPIRATORY_TRACT | Status: DC
  Administered 2012-09-24 – 2012-09-30 (×34): 0.5 mg via RESPIRATORY_TRACT
  Filled 2012-09-24 (×34): qty 2.5

## 2012-09-24 MED ORDER — ACETAMINOPHEN 325 MG PO TABS: 650.0000 mg | ORAL_TABLET | Freq: Four times a day (QID) | ORAL | Status: DC | PRN

## 2012-09-24 NOTE — Consult Note (Signed)
PULMONARY  / CRITICAL CARE MEDICINE  Name: Anita [name] MRN: [id number] DOB: [birth year]    ADMISSION DATE:  09/24/2012 CONSULTATION DATE:  1/29  REFERRING MD :  Dr. Clinton Sawyer - [facility]  CHIEF COMPLAINT:  Acute Respiratory Insufficiency  BRIEF PATIENT DESCRIPTION: 67 y/o WF, heavy smoker, with PMH obesity who presented to [facility] on 1/29 with 3-4 month history of progressively worsening shortness of breath.  Notes progressive weight gain, LE edema, and worsening of SOB in last week.  ER work up noted CHF, Acute Renal Failure, NSTEMI, Hypercarbic Respiratory Failure.  Placed on bipap with improvement in respiratory symptoms.    SIGNIFICANT EVENTS / STUDIES:  1/29 - Admit with CHF, Acute Renal Failure, NSTEMI, Hypercarbic Respiratory Failure.  LINES / TUBES:   CULTURES: 1/29 Flu>>> 1/29 BCx2>>> 1/29 UA>>>  ANTIBIOTICS: 1/29 Levaquin>>>  HISTORY OF PRESENT ILLNESS:  67 y/o WF, heavy smoker, with PMH of obesity who presented to [facility] ER on 1/29 with 3-4 month history of progressively worsening shortness of breath.  Notes progressive weight gain, LE edema, and worsening of SOB in last week prior to presentation.  Activated EMS and was noted to have saturations of 84% on 4L / [place].  Was placed on bipap per EMS.  Initially was somnolent but responded to noxious stimuli.  ER work up noted CHF, Acute Renal Failure, NSTEMI, Hypercarbic Respiratory Failure.  Negative lactic acid & cardiomegaly on CXR.  Improvement in respiratory symptoms and ABG with bipap.  Notes dry, non-productive cough, inability to lie flat, unable to smoke cigarettes due to SOB.  Denies chest pain, nausea, vomiting, diarrhea, sputum production, pain with inspiration.  Denies home medications or health problems other than "female issues".    PAST MEDICAL HISTORY :  Past Medical History  Diagnosis Date  . Fibroid   . Morbid obesity   . 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 No pertinent past surgical history. Prior to Admission medications   Not on File   Allergies  Allergen Reactions  . Benzene Rash    FAMILY HISTORY:  Family History  Problem Relation Age of Onset  . Cancer Mother     leukemia  . Hypertension Mother   . Diabetes Father   . Heart disease Father     passed away due to heart attack   SOCIAL HISTORY:  reports that she has been smoking Cigarettes.  She has a 50 pack-year smoking history. She has never used smokeless tobacco. She reports that she does not drink alcohol or use illicit drugs.  REVIEW OF SYSTEMS:   All systems reviewed - pertinent positives in HPI.   SUBJECTIVE:  Reports shortness of breath improved on bipap  VITAL SIGNS: Temp:  [97.5 F (36.4 C)-99.2 F (37.3 C)] 97.5 F (36.4 C) (01/29 1524) Pulse Rate:  [86-99] 86  (01/29 1714) Resp:  [22-30] 25  (01/29 1714) BP: (94-120)/(43-59) 101/49 mmHg (01/29 1557) SpO2:  [24 %-98 %] 91 % (01/29 1714)  HEMODYNAMICS:    VENTILATOR SETTINGS:    INTAKE / OUTPUT: Intake/Output    None     PHYSICAL EXAMINATION: General:  Morbidly obese, chronically ill appearing Neuro:  Drowsy but awakens and is appropriate, MAE HEENT:  Mm pink/moist, thick/ short neck Cardiovascular:  s1s2 rrr, no m/r/g Lungs:  resp's even/non-labored on bipap, lungs bilaterally with wheezing / crackles Abdomen:  Obese/soft, bsx4 active Musculoskeletal:  No acute deformities Skin:  Warm/dry, BLE erythema, small pinpoint ulcerations, changes c/w with chronic venous stasis  ASSESSMENT AND PLAN  PULMONARY  Lab 09/24/12 1541 09/24/12 1314  PHART 7.329* 7.202*  PCO2ART 46.9* 70.0*  PO2ART 89.0 46.0*  HCO3 24.8* 27.4*  O2SAT 96.0 70.0   Ventilator Settings:   CXR:  1/29 Pulmonary edema and hiatal hernia. ETT:  None  A:  Multifactorial respiratory failure due to COPD, morbid obesity, likely pulmonary HTN for OSA/OHV and pulmonary edema as well as deconditioning.   Patient has evidence of a troponin leak, under different circumstances I would intubate her for respiratory failure but patient is quickly improving with BiPAP and diureses that I suspect patient will be off BiPAP within the next few hours as lasix takes effect. P:   - Levaquin for CAP coverage and airway sterilization. - Diureses as BP and renal function permits. - Duoneb QID. - PRN albuterol. - Influenza PCR. - Would hold off tamiflu as the patient had none of the classic symptoms. - Solumedrol. - Pan culture.  CARDIOVASCULAR  Lab 09/24/12 1357 09/24/12 1309  TROPONINI -- --  LATICACIDVEN 1.2 --  PROBNP -- 17719.0*   ECG:  Per cards. Lines: PIV.  A: Troponin leak with elevated pro-BNP and renal dysfunction. P:  - 2D echo. - Cardiology consult. - Trend troponins. - Diureses.  RENAL  Lab 09/24/12 1309  NA 145  K 4.3  CL 107  CO2 28  BUN 43*  CREATININE 2.36*  CALCIUM 8.7  MG --  PHOS --   Intake/Output    None    Foley:  1/29>>>  A:  Acute renal failure, no history of such. P:   - KVO IVF. - Lasix as ordered. - Monitor lytes daily. - BMET in AM. - Renal U/S.  GASTROINTESTINAL  Lab 09/24/12 1309  AST 57*  ALT 39*  ALKPHOS 238*  BILITOT 0.6  PROT 7.1  ALBUMIN 2.9*    A:  Mild AST/ALT elevation. P:   - Monitor. - NPO for now.  HEMATOLOGIC  Lab 09/24/12 1309  HGB 8.9*  HCT 30.2*  PLT 172  INR --  APTT --   A:  No active issues. P:  - Monitor CBC.  INFECTIOUS  Lab 09/24/12 1309  WBC 9.0  PROCALCITON --   Cultures: Blood 1/29>>> Urine 1/29>>> Sputum 1/29>>> Influenza PCR 1/29>>> Antibiotics: Levaquin 1/29>>>  A:  No evidence of clear infection but will treat with levaquin for airway clearance. P:   - Levaquin. - Pan culture.  ENDOCRINE No results found for this basename: GLUCAP:5 in the last 168 hours A:  ?DM.   P:   - ISS. - Monitor CBG's.  NEUROLOGIC  A:  Lethargic with CO2 narcosis.  Improving rapidly. P:    - BiPAP for a short period of time. - Monitor, no indication for a head CT for now.  Will admit to the ICU for observation overnight, cardiology consult, echo, abx for sterilization of airway, renal u/s, steroids and will likely move out to SDU in AM.  CC time 45 min.  Koren Bound, M.D. Pulmonary and Critical Care Medicine [facility] Pager: [phone]  09/24/2012, 5:18 PM

## 2012-09-24 NOTE — ED Notes (Signed)
Results of troponin given to Dr. Effie Shy, elevated

## 2012-09-24 NOTE — Consult Note (Signed)
Cardiology Consult Note   Patient ID: Anita [name] MRN: [id number], DOB/AGE: Feb 07, 1946   Admit date: 09/24/2012 Date of Consult: 09/24/2012  Primary Physician: None Primary Cardiologist: New to cardiology   Reason for consult:  HPI: Anita [name] is a 67 y.o. female with minimal PMHx including ongoing tobacco abuse and obesity. She presents to [facility] ED with progressively worsening SOB, weight gain, LE edema and DOE.   She presented in the ED in respiratory failure improved on BiPAP. She is somnolent but is able to respond to yes no questions.  She says that she has been getting SOB over the past two months.  She has had increased edema. She sleeps with her head elevated chronically.  She denies any chest pain, neck or arm pain.  Her son reports that she was doing well prior to this and that she is usually not ill and doesn't see the doctor frequently.  She has not noticed any palpitations, presyncope or syncope.  She does report itching on her legs and she has been scratching these.  She has open weeping sores on her legs.    In the ED, EKG reveals NSR, inferolateral TW changes nonspecific. TnI elevated at 2.26. pBNP [id number]. Portable CXR reveals cardiomegaly, central venous congestion, left retrocardiac opacity likely representing hiatal hernia or ectatic aorta. CMET reveals acute renal failure (BUN 43/Cr 2.36; baseline Cr 1.0), elevated LFTs. CBC reveals a normocytic anemia (Hgb 8.9/Hct 30.2). ABG reveals acidosis. LDH WNL. Pulm/CCM was consulted, and suspect multifactorial respiratory failure due to COPD, morbid obesity, OSA/OHS, deconditioning and CHF. She has been started on CAP prophy antibiotics, nebs, steroids and Lasix. Cultures drawn.   Problem List: Past Medical History  Diagnosis Date  . Fibroid   . Morbid obesity   . 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 No pertinent past surgical history.   Allergies:  Allergies    Allergen Reactions  . Benzene Rash    Home Medications: Prior to Admission medications   Not on File    Inpatient Medications:     . levofloxacin (LEVAQUIN) IV  250 mg Intravenous Q24H    (Not in a hospital admission)  Family History  Problem Relation Age of Onset  . Cancer Mother     leukemia  . Hypertension Mother   . Diabetes Father   . Heart disease Father     passed away due to heart attack     History   Social History  . Marital Status: Widowed    Spouse Name: N/A    Number of Children: N/A  . Years of Education: N/A   Occupational History  . Not on file.   Social History Main Topics  . Smoking status: Current Every Day Smoker -- 2.0 packs/day for 25 years    Types: Cigarettes  . Smokeless tobacco: Never Used  . Alcohol Use: No  . Drug Use: No  . Sexually Active: No   Other Topics Concern  . Not on file   Social History Narrative   Lives with husband Anita [name] [phone]; Daughter who would like to make medical decisions is Anita [name] [phone]. Patient in school at [employer] for counseling degree.      Review of Systems:  As stated in the HPI and negative for all other systems.   Physical Exam: Blood pressure 101/49, pulse 86, temperature 97.5 F (36.4 C), temperature source Axillary, resp.  rate 25, SpO2 91.00%.  GENERAL:  Mild acute respiratory distress. HEENT:  Pupils equal round and reactive, fundi not visualized. NECK:  No jugular venous distention, waveform within normal limits, carotid upstroke brisk and symmetric, no bruits, no thyromegaly (very difficult exam with her size and acute dyspnea) LYMPHATICS:  No cervical, inguinal adenopathy LUNGS:  Decreased breath sounds without obvious crackles.  BACK:  No CVA tenderness CHEST:  Unremarkable HEART:  PMI not displaced or sustained,S1 and S2 within normal limits, no S3, no S4, no clicks, no rubs, no murmurs, distant heart sounds ABD:  Flat, positive bowel sounds normal in  frequency in pitch, no bruits, no rebound, no guarding, no midline pulsatile mass, no hepatomegaly, no splenomegaly EXT:  2 plus pulses throughout, severe weeping edema, no cyanosis no clubbing SKIN:  No rashes no nodules, multiple excoriations on both lower extremities.   NEURO:  Cranial nerves II through XII grossly intact, motor grossly intact throughout PSYCH:  Cognitively intact, oriented to person place and time   Labs: Recent Labs  [facility] 09/24/12 1309   WBC 9.0   HGB 8.9*   HCT 30.2*   MCV 87.0   PLT 172    Lab 09/24/12 1309  NA 145  K 4.3  CL 107  CO2 28  BUN 43*  CREATININE 2.36*  CALCIUM 8.7  PROT 7.1  BILITOT 0.6  ALKPHOS 238*  ALT 39*  AST 57*  AMYLASE --  LIPASE --  GLUCOSE 163*    Radiology/Studies: Dg Chest Portable 1 View  09/24/2012  *RADIOLOGY REPORT*  Clinical Data: Short of breath  PORTABLE CHEST - 1 VIEW  Comparison: None.  Findings: Normal cardiac silhouette.  Retrocardiac opacity likely representing a hernia.  Ectatic aorta is another consideration. There are low lung volumes.  Mild central venous pulmonary congestion.  IMPRESSION:  1. Cardiomegaly and central venous congestion. 2.  Left retrocardiac opacity likely representing a hiatal hernia or ectatic aorta.   Original Report Authenticated By: Genevive Bi, M.D.     EKG: NSR, 94 bpm, TW flattening/inversions II, III, aVF, V3-V6  ASSESSMENT AND PLAN:   Dyspnea: I suspect that this is multifactorial but this is a component of CHF.  I will order BID IV Lasix.  She will need an echo.  Labs should include a TSH.  Further management per the primary team and CCM.    Elevated troponin: This may be secondary to an acute exacerbation of a chronic lung disease.  However, this could represent a primary acute coronary syndrome.  She has no acute ST elevation and no ongoing pain.  I agree with starting heparin and ASA.  We will consider through the coarse of her hospitalization the need for cath vs.  Noninvasive evaluation.  Anemia: She has a normal MCV and there is no history of bleeding.  We should watch the Hgb closely on heparin.  I will order stool guaiac.    CKD: Follow creat closely with diuresis.   Signed, R. Hurman Horn, PA-C 09/24/2012, 5:46 PM

## 2012-09-24 NOTE — H&P (Signed)
Family Medicine Teaching [facility] Admission History and Physical Service Pager: [phone]  Patient name: Anita [name] Medical record number: [id number] Date of birth: September 17, 1945 Age: 67 y.o. Gender: female  Primary Care Provider: None  Chief Complaint: Acute SOB  Assessment and Plan: Anita [name] is a 67 y.o. year old female with little PMH but a long standing history of tobacco abuse who presents with acute SOB.  In the ED, patient found to be in acute respiratory failure and was placed on BiPAP.  Further work up revealed acute kidney injury (creatinine of 2.36), NSTEMI (elevated POC Troponin of 2.26), and Acute CHF with [place] proBNP of 84132.  EKG revealed no ST or T wave changes.   Respiratory  # Acute Respiratory failure secondary to acute COPD exacerbation and acute CHF exacerbation- CCM was consulted in the ED.  Patient will be place in ICU further care, but Family Med will remain the primary team   CHF exacerbation  - Will continue BiPAP (RT to titrate)  - Strict I/O's and Daily weights  - Will start diuresis with IV Lasix 80 mg now  - Will also obtain 2D Echo to assess underlying cardiac function   COPD Exacerbation  - Once patient is off BiPAP, will start Duonebs Q4/Q2 PRN.    - Prednisone 60 mg daily (patient already received Solumedrol 125 mg)  - IV Levaquin per pharmacy  Cardiac # NSTEMI - Elevated troponin of 2.26, EKG revealed no ST or T wave changes - Patient has received Aspirin 324 mg - Will start patient on Heparin drip and cycle Troponin's x 3. - Cardiology consulted and aware - EKG in the am. Echo to be obtained (see above)  Renal # Acute Kidney Injury - Likely secondary to decreased perfusion in the setting of CHF - Prior creatinine (05/2011) 1.02. - Creatinine on admission - 2.36 - Renal ultrasound ordered - Will hopefully improve following diuresis. Will monitor closely via daily BMP.  ID # ? PNA - Chest xray revealed retrocardiac opacity  that could represent focal infiltrate.  Patient afebrile without leukocytosis - Will treat empirically with IV Levaquin (dosed per pharmacy) - Also obtaining blood and urine cultures as well as Influenza PCR (per CCM)  Heme/Onc - Patient noted to be anemic with Hb of 8.9 (priors 11.9-12.9). Normocytic with MCV of 87. - Will monitor closely and consider further work up during admission.  FEN/GI: NPO. Prophylaxis: Heparin SQ Disposition: Pending clinical improvement Code Status: Full Code  History of Present Illness: Anita [name] is a 67 y.o. year old female with little PMH but a long standing history of tobacco abuse who presents with sub-acute SOB.  History obtained from medical record and family who is present.  Patient currently on BIPAP and minimally responsive.  Level V Caveat applies.  Family reports that the patient has been experiencing worsening SOB over the past 2 months.  Over the past 2 days, this has acutely worsened.  Today, patient was noted to be pale, lethargic, and increasingly SOB prompting family to call 911.  On arrival, EMS noted patient to be hypoxemic.  Oxygen saturation was 84 % on 4L [place].    In the ED, patient was somnolent and in acute respiratory failure.  ABG was obtained and revealed pH 7.202, pCO2 70, pO2 46, Bicarb of 27.4.  Patient was then placed on BIPAP.  Physical exam notable for poor air movement and significant pitting edema of both lower extremities.  Further work up revealed creatinine  of 2.36, elevated troponin of 2.26, and [place] of [address].  ROS: Chest pain. (severe, tightness), Cough. Worsening LE edema.  Past Medical History: Past Medical History  Diagnosis Date  . Fibroid   . Morbid obesity   . 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 No pertinent past surgical history. Social History: History  Substance Use Topics  . Smoking status: Current Every Day Smoker  -- 2.0 packs/day for 25 years    Types: Cigarettes  . Smokeless tobacco: Never Used  . Alcohol Use: No   Family History: Family History  Problem Relation Age of Onset  . Cancer Mother     leukemia  . Hypertension Mother   . Diabetes Father   . Heart disease Father     passed away due to heart attack   Allergies: Allergies  Allergen Reactions  . Benzene Rash   Review Of Systems: Per HPI.    Physical Exam: BP 101/49  Pulse 88  Temp 97.5 F (36.4 C) (Axillary)  Resp 24  SpO2 24% Exam: General: ill appearing elderly female on BIPAP.  HEENT: NCAT.  Cardiovascular: RRR. No appreciable murmurs, rubs, or gallops.  Respiratory: Coarse breath sounds with diffuse rales and expiratory wheezing. Abdomen: obese, soft, mildly distended.  Non tender to palpation. Extremities: 2+ pitting pretibial lower extremity.  Bilateral anterior and posterior venous stasis ulcerations noted with surrounding erythema. Skin: Lower extremities (L & R) bilateral anterior and posterior ulcerations with nonpurulent drainiage and surrounding erythema. Neuro: On BiPAP. Patient responds to questions slowly by shaking her head.    Labs and Imaging: CBC BMET   Lab 09/24/12 1309  WBC 9.0  HGB 8.9*  HCT 30.2*  PLT 172    Lab 09/24/12 1309  NA 145  K 4.3  CL 107  CO2 28  BUN 43*  CREATININE 2.36*  GLUCOSE 163*  CALCIUM 8.7     BNP    Component Value Date/Time   PROBNP 17719.0* 09/24/2012 1309   Arterial Blood Gas result:  pO2 46, pCO2 70, pH 7.202, HCO3 27.4, %O2 Sat 70  Dg Chest Portable 1 View 09/24/2012  IMPRESSION:  1. Cardiomegaly and central venous congestion. 2.  Left retrocardiac opacity likely representing a hiatal hernia or ectatic aorta.  POC Troponin - 2.26  EKG: NSR; no evidence of ischemia  Everlene Other, DO 09/24/2012, 4:27 PM  I have seen and evaluated the patient with Dr. Adriana Simas. I have made appropriate amendments.   Si Raider Clinton Sawyer, MD, MBA 09/25/2012, 8:15  AM Family Medicine Resident, PGY-2 [phone] pager

## 2012-09-24 NOTE — ED Provider Notes (Signed)
History     CSN: [id number]  Arrival date & time 09/24/12  1225   First MD Initiated Contact with Patient 09/24/12 1240      Chief Complaint  Patient presents with  . Shortness of Breath    (Consider location/radiation/quality/duration/timing/severity/associated sxs/prior treatment) HPI  Anita [name] is a 67 y.o. female complaining of severe shortness of breath worsening over the course last 2 months. Patient is morbidly obese, active daily smoker 2 packs per day. She is somnolent but rousable to voice or noxious stimuli. EMS was called and O2 sat at home was to be performed 84% on 4 L by nasal cannula. Level V caveat secondary to somnolence. As per family patient is not mentating at her baseline.   Past Medical History  Diagnosis Date  . Fibroid   . Morbid obesity   . 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 No pertinent past surgical history.  Family History  Problem Relation Age of Onset  . Cancer Mother     leukemia  . Hypertension Mother   . Diabetes Father   . Heart disease Father     passed away due to heart attack    History  Substance Use Topics  . Smoking status: Current Every Day Smoker -- 2.0 packs/day for 25 years    Types: Cigarettes  . Smokeless tobacco: Never Used  . Alcohol Use: No    OB History    Grav Para Term Preterm Abortions TAB SAB Ect Mult Living   2 2        2       Review of Systems  Unable to perform ROS: Mental status change  Constitutional: Negative for fever.  Respiratory: Positive for shortness of breath.   Cardiovascular: Negative for chest pain.  Gastrointestinal: Negative for nausea, vomiting, abdominal pain and diarrhea.  All other systems reviewed and are negative.    Allergies  Benzene  Home Medications  No current outpatient prescriptions on file.  BP 120/55  Pulse 99  Temp 99.2 F (37.3 C) (Oral)  Resp 22  SpO2 98%  Physical Exam  Nursing note and  vitals reviewed. Constitutional: She is oriented to person, place, and time. She appears well-developed and well-nourished. She appears distressed.       Morbidly obese, somnolent  HENT:  Head: Normocephalic and atraumatic.  Mouth/Throat: Oropharynx is clear and moist.  Eyes: Conjunctivae normal and EOM are normal. Pupils are equal, round, and reactive to light.  Neck: Normal range of motion. Neck supple.  Cardiovascular: Normal rate, regular rhythm, normal heart sounds and intact distal pulses.   Pulmonary/Chest: No stridor. She has no wheezes.        Very poor air movement in all fields  Abdominal: Soft. Bowel sounds are normal.  Musculoskeletal: Normal range of motion. She exhibits edema.       Significant pitting edema to upper shin. There are ulcerations oozing nonpurulent material.  Neurological: She is alert and oriented to person, place, and time.  Skin: Skin is warm.  Psychiatric: She has a normal mood and affect.    ED Course  Procedures (including critical care time)  CRITICAL CARE Performed by: Wynetta Emery   Total critical care time: 45  Critical care time was exclusive of separately billable procedures and treating other patients.  Critical care was necessary to treat or prevent imminent or life-threatening deterioration.  Critical care was time spent personally by  me on the following activities: development of treatment plan with patient and/or surrogate as well as nursing, discussions with consultants, evaluation of patient's response to treatment, examination of patient, obtaining history from patient or surrogate, ordering and performing treatments and interventions, ordering and review of laboratory studies, ordering and review of radiographic studies, pulse oximetry and re-evaluation of patient's condition.  Labs Reviewed  CBC WITH DIFFERENTIAL - Abnormal; Notable for the following:    RBC 3.47 (*)     Hemoglobin 8.9 (*)     HCT 30.2 (*)     MCH 25.6  (*)     MCHC 29.5 (*)     RDW 25.2 (*)     Neutrophils Relative 84 (*)     Lymphocytes Relative 10 (*)     All other components within normal limits  COMPREHENSIVE METABOLIC PANEL - Abnormal; Notable for the following:    Glucose, Bld 163 (*)     BUN 43 (*)     Creatinine, Ser 2.36 (*)     Albumin 2.9 (*)     AST 57 (*)     ALT 39 (*)     Alkaline Phosphatase 238 (*)     GFR calc non Af Amer 20 (*)     GFR calc Af Amer 24 (*)     All other components within normal limits  PRO B NATRIURETIC PEPTIDE - Abnormal; Notable for the following:    Pro B Natriuretic peptide (BNP) 17719.0 (*)     All other components within normal limits  POCT I-STAT 3, BLOOD GAS (G3+) - Abnormal; Notable for the following:    pH, Arterial 7.202 (*)     pCO2 arterial 70.0 (*)     pO2, Arterial 46.0 (*)     Bicarbonate 27.4 (*)     All other components within normal limits  POCT I-STAT TROPONIN I - Abnormal; Notable for the following:    Troponin i, poc 2.26 (*)     All other components within normal limits  POCT I-STAT 3, BLOOD GAS (G3+) - Abnormal; Notable for the following:    pH, Arterial 7.329 (*)     pCO2 arterial 46.9 (*)     Bicarbonate 24.8 (*)     All other components within normal limits  LACTIC ACID, PLASMA  BLOOD GAS, ARTERIAL   Dg Chest Portable 1 View  09/24/2012  *RADIOLOGY REPORT*  Clinical Data: Short of breath  PORTABLE CHEST - 1 VIEW  Comparison: None.  Findings: Normal cardiac silhouette.  Retrocardiac opacity likely representing a hernia.  Ectatic aorta is another consideration. There are low lung volumes.  Mild central venous pulmonary congestion.  IMPRESSION:  1. Cardiomegaly and central venous congestion. 2.  Left retrocardiac opacity likely representing a hiatal hernia or ectatic aorta.   Original Report Authenticated By: Genevive Bi, M.D.     Date: 09/24/2012  Rate: 94  Rhythm: normal sinus rhythm  QRS Axis: normal  Intervals: normal  ST/T Wave abnormalities: normal   Conduction Disutrbances:nonspecific intraventricular conduction delay  Narrative Interpretation: T-wave inversions in lead 3  Old EKG Reviewed: none available    1. COPD exacerbation   2. Acute kidney injury   3. CHF exacerbation   4. NSTEMI (non-ST elevated myocardial infarction)   5. Tobacco abuse disorder       MDM  Active smoker hypoxic in the mid 80s on 4 L of nasal cannula. ABG shows mild respiratory acidosis pH is 7.2, CO2 trapping with PCO2 of 68.9 and  PO2 of 45. This is likely the cause of her somnolence. BiPAP initiated she is responsive to that setting in the low 90s and respiration rate is mildly tachypneic at around 22. Rectal temperature is normal and lactate is not elevated.  Chest x-ray shows cardiomegaly no effusions likely hiatal hernia.  Critical result of troponin of 2.26. Full dose aspirin administered.  BNP is significantly elevated at 177,000. Creatinine is 2.36 and BUN is 43. Patient is mildly anemic at this 8.9 and over 30  Filed Vitals:   09/24/12 1338 09/24/12 1400 09/24/12 1430 09/24/12 1524  BP:  94/45 108/50 108/43  Pulse:  89 91 90  Temp: 98.4 F (36.9 C)   97.5 F (36.4 C)  TempSrc: Rectal   Axillary  Resp:   30 23  SpO2:  92% 91% 93%   Cardiology consult appreciated: [place] will consult request a medical admission.  Repeat ABG on BiPAP shows improvement with pH of 7.3 and PCO2 of 48 PO2 is now 87 discussed with attending who feels it is reasonable to keep the BiPAP for several more hours.  Patient will be admitted to [facility] Dr. Adriana Simas to a step down bed.   Wynetta Emery, PA-C 09/24/12 1607

## 2012-09-24 NOTE — ED Notes (Signed)
Dennis from Laboratory called to report a critical lab troponin of 2.45.

## 2012-09-24 NOTE — ED Notes (Signed)
Pt from home, c/o increasing SOB and decreased activity over the last few weeks. EMS o2 sat 84% on 4l [place], Pt arrived on CPap @ 98 %

## 2012-09-24 NOTE — ED Provider Notes (Signed)
Anita [name] is a 66 y.o. female who has no medical care previously, presents with gradually worsening, shortness of breath, for several months. Today, she is sleepy, and difficult to wake up. There's been no recent fever, chills, nausea, or vomiting.  Exam elderly, obese female that appears much older than stated age. She is very poor air movement, and generalized wheezing. She is lethargic, but follows commands.  ED treatment included initial aggressive management of oxygen by pulse oxygenation. Placed on nasal cannula oxygen, and back, a facemask oxygen, and then placed on BiPAP. Patient improved.   Patient admitted.    Medical screening examination/treatment/procedure(s) were conducted as a shared visit with non-physician practitioner(s) and myself.  I personally evaluated the patient during the encounter  Flint Melter, MD 09/24/12 [phone]

## 2012-09-24 NOTE — Progress Notes (Addendum)
ANTIBIOTIC/ANTICOAGULATION CONSULT NOTE - INITIAL  Pharmacy Consult for Levaquin and Heparin Indication: CAP and NSTEMI  Allergies  Allergen Reactions  . Benzene Rash    Patient Measurements: Height: 5\' 2"  (157.5 cm) Weight: 252 lb 13.9 oz (114.7 kg) IBW/kg (Calculated) : 50.1  Heparin dosing wt: 78 kg  Vital Signs: Temp: 97.4 F (36.3 C) (01/29 2000) Temp src: Axillary (01/29 2000) BP: 106/62 mmHg (01/29 2130) Pulse Rate: 82  (01/29 2130) Intake/Output from previous day:   Intake/Output from this shift: Total I/O In: 16.2 [I.V.:8.2; IV Piggyback:8] Out: 285 [Urine:285]  Labs:  [facility] 09/24/12 1309  WBC 9.0  HGB 8.9*  PLT 172  LABCREA --  CREATININE 2.36*   Estimated Creatinine Clearance: 28.5 ml/min (by C-G formula based on Cr of 2.36). No results found for this basename: VANCOTROUGH:2,VANCOPEAK:2,VANCORANDOM:2,GENTTROUGH:2,GENTPEAK:2,GENTRANDOM:2,TOBRATROUGH:2,TOBRAPEAK:2,TOBRARND:2,AMIKACINPEAK:2,AMIKACINTROU:2,AMIKACIN:2, in the last 72 hours   Microbiology: No results found for this or any previous visit (from the past 720 hour(s)).  Medical History: Past Medical History  Diagnosis Date  . Fibroid   . Morbid obesity   . 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 COPD (chronic obstructive pulmonary disease)     Medications:  No PTA medications  Assessment: 67 y.o. female presents with SOB. Pt currently on bipap.   Anticoagulation: To start heparin gtt for NSTEMI. Hgb 8.9 at baseline - will watch.  ID: To start levaquin for CAP. Blood cx pending. Tm 99.2. CrCl ~30 ml/min.  Goal of Therapy:  Eradication of infection Heparin level 0.3-0.7 IU/ml; Will monitor platelets  Plan:  1. Levaquin 500mg  IV now then 250mg  IV q24h. 2. Will f/u microbiological data, renal function, pt's clinical condition. 3. Heparin 4000 units IV bolus 4. Heparin gtt at 1000 units/hr 5. Heparin level in 8 hours 6. Daily heparin level  and CBC  Christoper Fabian, PharmD, BCPS Clinical pharmacist, pager [phone] 09/24/2012,9:52 PM

## 2012-09-25 ENCOUNTER — Inpatient Hospital Stay (HOSPITAL_COMMUNITY): Payer: Medicare Other

## 2012-09-25 DIAGNOSIS — I214 Non-ST elevation (NSTEMI) myocardial infarction: Secondary | ICD-10-CM

## 2012-09-25 DIAGNOSIS — I517 Cardiomegaly: Secondary | ICD-10-CM

## 2012-09-25 LAB — GLUCOSE, CAPILLARY: Glucose-Capillary: 232 mg/dL — ABNORMAL HIGH (ref 70–99)

## 2012-09-25 LAB — BASIC METABOLIC PANEL
BUN: 54 mg/dL — ABNORMAL HIGH (ref 6–23)
Chloride: 105 mEq/L (ref 96–112)
GFR calc Af Amer: 23 mL/min — ABNORMAL LOW (ref >90)
GFR calc non Af Amer: 19 mL/min — ABNORMAL LOW (ref >90)
Potassium: 5.3 mEq/L — ABNORMAL HIGH (ref 3.5–5.1)
Sodium: 145 mEq/L (ref 135–145)

## 2012-09-25 LAB — HEPARIN LEVEL (UNFRACTIONATED)
Heparin Unfractionated: 0.3 IU/mL (ref 0.30–0.70)
Heparin Unfractionated: 0.13 IU/mL — ABNORMAL LOW (ref 0.30–0.70)

## 2012-09-25 LAB — CBC
Hemoglobin: 9 g/dL — ABNORMAL LOW (ref 12.0–15.0)
MCHC: 28.6 g/dL — ABNORMAL LOW (ref 30.0–36.0)
WBC: 9.9 10*3/uL (ref 4.0–10.5)

## 2012-09-25 LAB — TSH: TSH: 0.162 u[IU]/mL — ABNORMAL LOW (ref 0.350–4.500)

## 2012-09-25 LAB — TROPONIN I
Troponin I: 1.48 ng/mL (ref <0.30)
Troponin I: 0.97 ng/mL (ref <0.30)

## 2012-09-25 LAB — INFLUENZA PANEL BY PCR (TYPE A & B)
Influenza A By PCR: NEGATIVE
Influenza B By PCR: NEGATIVE

## 2012-09-25 LAB — HEMOGLOBIN A1C
Hgb A1c MFr Bld: 6.6 % — ABNORMAL HIGH (ref <5.7)
Mean Plasma Glucose: 143 mg/dL — ABNORMAL HIGH (ref <117)

## 2012-09-25 MED ORDER — "THROMBI-PAD 3""X3"" EX PADS"
1.0000 | MEDICATED_PAD | Freq: Once | CUTANEOUS | Status: DC
  Filled 2012-09-25: qty 1

## 2012-09-25 MED ORDER — TORSEMIDE 100 MG PO TABS: 100.0000 mg | ORAL_TABLET | Freq: Two times a day (BID) | ORAL | Status: DC

## 2012-09-25 MED ORDER — INSULIN ASPART 100 UNIT/ML ~~LOC~~ SOLN
0.0000 [IU] | Freq: Three times a day (TID) | SUBCUTANEOUS | Status: DC
  Administered 2012-09-25 – 2012-09-26 (×3): 3 [IU] via SUBCUTANEOUS
  Administered 2012-09-26: 1 [IU] via SUBCUTANEOUS
  Administered 2012-09-27: 2 [IU] via SUBCUTANEOUS
  Administered 2012-09-27: 5 [IU] via SUBCUTANEOUS
  Administered 2012-09-27 – 2012-09-28 (×3): 2 [IU] via SUBCUTANEOUS
  Administered 2012-09-28 – 2012-09-29 (×3): 3 [IU] via SUBCUTANEOUS
  Administered 2012-09-30: 1 [IU] via SUBCUTANEOUS
  Administered 2012-09-30: 3 [IU] via SUBCUTANEOUS
  Administered 2012-09-30: 9 [IU] via SUBCUTANEOUS
  Administered 2012-10-01 (×2): 5 [IU] via SUBCUTANEOUS
  Administered 2012-10-01: 2 [IU] via SUBCUTANEOUS
  Administered 2012-10-02 (×2): 1 [IU] via SUBCUTANEOUS

## 2012-09-25 MED ORDER — PERFLUTREN LIPID MICROSPHERE
INTRAVENOUS | Status: AC
  Filled 2012-09-25: qty 10

## 2012-09-25 MED ORDER — NICOTINE 21 MG/24HR TD PT24
21.0000 mg | MEDICATED_PATCH | Freq: Every day | TRANSDERMAL | Status: DC
  Filled 2012-09-25: qty 1

## 2012-09-25 MED ORDER — ALBUTEROL SULFATE (5 MG/ML) 0.5% IN NEBU
5.0000 mg | INHALATION_SOLUTION | RESPIRATORY_TRACT | Status: DC
  Administered 2012-09-25 – 2012-09-30 (×30): 5 mg via RESPIRATORY_TRACT
  Filled 2012-09-25: qty 0.5
  Filled 2012-09-25 (×4): qty 1
  Filled 2012-09-25: qty 0.5
  Filled 2012-09-25: qty 1
  Filled 2012-09-25: qty 0.5
  Filled 2012-09-25: qty 1
  Filled 2012-09-25 (×5): qty 0.5
  Filled 2012-09-25 (×2): qty 1
  Filled 2012-09-25 (×3): qty 0.5
  Filled 2012-09-25: qty 1
  Filled 2012-09-25 (×4): qty 0.5
  Filled 2012-09-25 (×2): qty 1
  Filled 2012-09-25: qty 0.5
  Filled 2012-09-25 (×3): qty 1
  Filled 2012-09-25: qty 0.5
  Filled 2012-09-25: qty 1
  Filled 2012-09-25 (×6): qty 0.5
  Filled 2012-09-25: qty 1

## 2012-09-25 MED ORDER — ALBUTEROL SULFATE (5 MG/ML) 0.5% IN NEBU: 5.0000 mg | INHALATION_SOLUTION | RESPIRATORY_TRACT | Status: DC | PRN

## 2012-09-25 MED ORDER — HEPARIN (PORCINE) IN NACL 100-0.45 UNIT/ML-% IJ SOLN: 11.0000 [IU]/kg/h | INTRAMUSCULAR | Status: DC

## 2012-09-25 MED ORDER — FUROSEMIDE 10 MG/ML IJ SOLN
160.0000 mg | Freq: Two times a day (BID) | INTRAVENOUS | Status: DC
  Administered 2012-09-25 (×2): 160 mg via INTRAVENOUS
  Filled 2012-09-25 (×3): qty 16

## 2012-09-25 MED ORDER — INSULIN ASPART 100 UNIT/ML ~~LOC~~ SOLN
0.0000 [IU] | Freq: Every day | SUBCUTANEOUS | Status: DC
  Administered 2012-09-26 – 2012-09-27 (×2): 2 [IU] via SUBCUTANEOUS
  Administered 2012-09-28: 3 [IU] via SUBCUTANEOUS
  Administered 2012-09-29 – 2012-09-30 (×2): 2 [IU] via SUBCUTANEOUS

## 2012-09-25 MED ORDER — LEVOFLOXACIN 250 MG PO TABS
250.0000 mg | ORAL_TABLET | Freq: Every day | ORAL | Status: DC
  Administered 2012-09-25 – 2012-10-02 (×8): 250 mg via ORAL
  Filled 2012-09-25 (×8): qty 1

## 2012-09-25 MED ORDER — HEPARIN (PORCINE) IN NACL 100-0.45 UNIT/ML-% IJ SOLN
1250.0000 [IU]/h | INTRAMUSCULAR | Status: DC
  Administered 2012-09-25: 1200 [IU]/h via INTRAVENOUS
  Administered 2012-09-25: 1100 [IU]/h via INTRAVENOUS
  Administered 2012-09-26: 1250 [IU]/h via INTRAVENOUS
  Filled 2012-09-25 (×2): qty 250

## 2012-09-25 MED ORDER — HEPARIN (PORCINE) IN NACL 100-0.45 UNIT/ML-% IJ SOLN
1100.0000 [IU]/h | INTRAMUSCULAR | Status: DC
  Filled 2012-09-25: qty 250

## 2012-09-25 MED ORDER — ASPIRIN 81 MG PO CHEW
CHEWABLE_TABLET | ORAL | Status: AC
  Administered 2012-09-25: 81 mg
  Filled 2012-09-25: qty 1

## 2012-09-25 MED ORDER — HEPARIN (PORCINE) IN NACL 100-0.45 UNIT/ML-% IJ SOLN: 1100.0000 [IU]/h | INTRAMUSCULAR | Status: DC

## 2012-09-25 MED ORDER — TORSEMIDE 100 MG PO TABS
100.0000 mg | ORAL_TABLET | Freq: Two times a day (BID) | ORAL | Status: DC
  Administered 2012-09-25: 100 mg via ORAL
  Filled 2012-09-25 (×4): qty 1

## 2012-09-25 NOTE — Progress Notes (Signed)
PULMONARY  / CRITICAL CARE MEDICINE  Name: Anita [name] MRN: [id number] DOB: Nov 12, 1945    ADMISSION DATE:  09/24/2012 CONSULTATION DATE:  1/29  REFERRING MD :  Dr. Clinton Sawyer - [facility]  CHIEF COMPLAINT:  Acute Respiratory Insufficiency  BRIEF PATIENT DESCRIPTION: 47 y/oF obese, heavy smoker (2ppd) admitted 1/29 with presumed newly diagnosed CHF exacerbation, and STEMI, acute hypercarbic respiratory failure, and acute renal failure after experiencing 3-24m worsening shortness of breath and weight gain.  Hypercarbic respiratory failure requiring BiPAP.  SIGNIFICANT EVENTS / STUDIES:  1/29 - Admit with CHF, Acute Renal Failure, NSTEMI, Hypercarbic Respiratory Failure. 1/30 - Renal US - neg for obstruction, atrophy  LINES / TUBES: 1/29 Foley catheter  CULTURES: 1/29 Flu>>> 1/29 BCx2>>> 1/29 Urine culture >>>  ANTIBIOTICS: 1/29 Levaquin>>>  SUBJECTIVE/ INTERVAL HISTORY:   Reports shortness of breath improved on bipap, is hungry, otherwise no chest pain, nausea, vomiting, cough, abdominal pain.  VITAL SIGNS: Temp:  [97.4 F (36.3 C)-99.2 F (37.3 C)] 97.5 F (36.4 C) (01/30 0400) Pulse Rate:  [81-99] 82  (01/30 0700) Resp:  [16-30] 21  (01/30 0700) BP: (83-135)/(43-77) 113/63 mmHg (01/30 0700) SpO2:  [24 %-98 %] 90 % (01/30 0700) FiO2 (%):  [40 %-50 %] 40 % (01/30 0414) Weight:  [247 lb 9.2 oz (112.3 kg)-252 lb 13.9 oz (114.7 kg)] 247 lb 9.2 oz (112.3 kg) (01/30 0500) HEMODYNAMICS:   VENTILATOR SETTINGS: Vent Mode:  [-]  FiO2 (%):  [40 %-50 %] 40 %  INTAKE / OUTPUT:  Intake/Output Summary (Last 24 hours) at 09/25/12 4098 Last data filed at 09/25/12 0700  Gross per 24 hour  Intake 216.17 ml  Output   1065 ml  Net -848.83 ml    PHYSICAL EXAMINATION: General:  Morbidly obese, chronically ill appearing Neuro:  Alert and oriented x3, moving all 4 extremities spontaneous, no asterexis HEENT:  Mm pink/moist, thick/ short neck Cardiovascular:  s1s2 rrr, no m/r/g Lungs:    No increased work of breathing on BiPAP thinglungs bilaterally with wheezing / crackles Abdomen:  Obese/soft, bsx4 active Musculoskeletal:  No acute deformities Skin:  Warm/dry, BLE erythema, small pinpoint ulcerations, changes c/w with chronic venous stasis   ASSESSMENT AND PLAN  PULMONARY  Lab 09/24/12 1541 09/24/12 1314  PHART 7.329* 7.202*  PCO2ART 46.9* 70.0*  PO2ART 89.0 46.0*  HCO3 24.8* 27.4*  TCO2 26 29   A:   1) Acute hypercarbic respiratory failure - likely multifactorial secondary to pulmonary HTN for OSA/OHS and pulmonary edema, COPD, morbid obesity, likely as well as deconditioning.  2) Suspected COPD exacerbation - significant wheezing on admission. Continue steroids, bronchodilators, and initial. Coverage peer 3) Tobacco abuse - 2 packs per day P:   - Levaquin for CAP coverage  - Diureses will likely be the largest contributing treatment. As BP and renal function permits. - Duoneb QID. - PRN albuterol. - Influenza PCR - pending  - Steroids -rapid taper over 1-2 weeks - Pan culture. - noct BiPAP- sleep study as outpt -likely has OSA/OHS -Smoking cesation  CARDIOVASCULAR  Lab 09/25/12 0535 09/24/12 2325 09/24/12 1732  TROPONINI 0.97* 1.48* 2.45*    Lab 09/24/12 1309  PROBNP 17719.0*   A: 1) Elevated troponin -  possibly contributed by acute exacerbation of chronic lung disease. However given EKG changes, possible acute cardiac cause. Acute kidney injury also likely contributed towards elevation. P:  - 2D echo. - Heparin drip and aspirin  - Diureses. - Cardiology considering need for catheterization versus noninvasive ischemia evaluation  RENAL  Lab 09/25/12 0535 09/24/12 1309  NA 145 145  K 5.3* 4.3  CL 105 107  CO2 24 28  BUN 54* 43*  CREATININE 2.46* 2.36*  GLUCOSE 163* 163*  MG 2.7* --  PHOS 8.0* --   A:  1) Acute renal failure - likely in the setting of CHF exacerbation. Renal ultrasound negative for hydronephrosis, obstruction, or  atrophy -unclear cause ? Cardiorenal , baseline 1.0 2012 P:   - Continue diuresis  - BMET in AM.  GASTROINTESTINAL  Lab 09/25/12 0535 09/24/12 1309  HGB 9.0* 8.9*  HCT 31.5* 30.2*  AST -- 57*  ALT -- 39*  ALKPHOS -- 238*  BILITOT -- 0.6  ALBUMIN -- 2.9*   A:  1) Suspected congestive hepatopathy  - in setting of CHF exacerbation 2) Hepatic steatosis - may otherwise account for the elevated LFTs  P:   - Monitor. - Escalate diet  HEMATOLOGIC  Lab 09/25/12 0535 09/24/12 1309  WBC 9.9 9.0  HGB 9.0* 8.9*  HCT 31.5* 30.2*  MCV 87.5 87.0  PLT 168 172  INR -- --   A: 1) Acute normocytic anemia - admission hemoglobin 8.9 . Prior BL hemoglobin 12-13. Cause unclear. FOBT negative. Anemia panel pending. P:  - Monitor CBC. - Followup anemia panel.  INFECTIOUS  Lab 09/25/12 0535 09/24/12 1357 09/24/12 1309  WBC 9.9 -- 9.0  NEUTROABS -- -- 7.6  LATICACIDVEN -- 1.2 --  PROCALCITON -- -- --  A:  1) UTI - urine culture pending. 2) Possible community acquired pneumonia - suggested on chest x-ray, however less consistent with chronicity of patient's history. P:   - Levaquin, to cover for both UTI and possible COPD exacerbation. - Urine culture, flu PCR, blood cultures pending.  ENDOCRINE No results found for this basename: GLUCAP:5 in the last 168 hours No results found for this basename: HGBA1C,  TSH     A: High risk for DM  P:   - ISS. - Monitor CBG's. - Check A1c.  NEUROLOGIC A:  Lethargic with CO2 narcosis - resolved P:   - Monitor, no indication for a head CT for now.  Can transfer to SDU  Signed: Johnette Abraham, D.OConsuello Bossier, Internal Medicine Resident Pager: [phone] (7AM-5PM)  Care during the described time interval was provided by me and/or other providers on the critical care team.  I have reviewed this patient's available data, including medical history, events of note, physical examination and test results as part of my evaluation  ALVA,RAKESH  V.

## 2012-09-25 NOTE — Progress Notes (Signed)
Utilization Review Completed.Rowland Ericsson T1/30/2014   

## 2012-09-25 NOTE — Progress Notes (Signed)
ANTICOAGULATION CONSULT NOTE - Follow Up Consult  Pharmacy Consult for heparin Indication: NSTEMI  Allergies  Allergen Reactions  . Benzene Rash   Patient Measurements: Height: 5\' 2"  (157.5 cm) Weight: 247 lb 9.2 oz (112.3 kg) IBW/kg (Calculated) : 50.1  Heparin Dosing Weight: 78kg  Vital Signs: Temp: 97.8 F (36.6 C) (01/30 2000) Temp src: Oral (01/30 2000) BP: 114/71 mmHg (01/30 2000) Pulse Rate: 87  (01/30 2000)  Labs:  Basename 09/25/12 1818 09/25/12 0535 09/24/12 2325 09/24/12 1732 09/24/12 1309  HGB -- 9.0* -- -- 8.9*  HCT -- 31.5* -- -- 30.2*  PLT -- 168 -- -- 172  APTT -- -- -- -- --  LABPROT -- -- -- -- --  INR -- -- -- -- --  HEPARINUNFRC 0.13* 0.30 -- -- --  CREATININE -- 2.46* -- -- 2.36*  CKTOTAL -- -- -- -- --  CKMB -- -- -- -- --  TROPONINI -- 0.97* 1.48* 2.45* --   Estimated Creatinine Clearance: 27 ml/min (by C-G formula based on Cr of 2.46).  Medications:  Scheduled:     . albuterol  5 mg Nebulization Q4H  . antiseptic oral rinse  15 mL Mouth Rinse q12n4p  . aspirin EC  81 mg Oral Daily  . chlorhexidine  15 mL Mouth Rinse BID  . Chlorhexidine Gluconate Cloth  6 each Topical Q0600  . insulin aspart  0-5 Units Subcutaneous QHS  . insulin aspart  0-9 Units Subcutaneous TID WC  . ipratropium  0.5 mg Nebulization Q4H  . levofloxacin  250 mg Oral Daily  . mupirocin ointment  1 application Nasal BID  . predniSONE  60 mg Oral Q breakfast  . sodium chloride  3 mL Intravenous Q12H  . sodium chloride  3 mL Intravenous Q12H  . torsemide  100 mg Oral BID   Assessment: 67 yo female here with SOB and possible ACS on heparin.  Her heparin level this evening is sub-therapeutic at 0.13.  I spoke with the nurse who reports some bleeding issues when IV line became infiltrated and needed to be removed.  Pressure held for 30 minutes and no further bleeding noted.  I am hesitant to go up more than 1 ut/kg TBW given this bleeding complication.    Goal of  Therapy:  Heparin level 0.3-0.7 units/ml Monitor platelets by anticoagulation protocol: Yes   Plan:  -Will increase heparin to 1200 units/hr and recheck a heparin level in the morning.  Nadara Mustard, PharmD., MS Clinical Pharmacist Pager:  [phone] Thank you for allowing pharmacy to be part of this patients care team.  09/25/2012 8:50 PM

## 2012-09-25 NOTE — Progress Notes (Signed)
ANTICOAGULATION CONSULT NOTE - Follow Up Consult  Pharmacy Consult for heparin Indication: NSTEMI  Allergies  Allergen Reactions  . Benzene Rash    Patient Measurements: Height: 5\' 2"  (157.5 cm) Weight: 247 lb 9.2 oz (112.3 kg) IBW/kg (Calculated) : 50.1  Heparin Dosing Weight: 78kg  Vital Signs: Temp: 97.5 F (36.4 C) (01/30 0400) Temp src: Axillary (01/30 0400) BP: 113/63 mmHg (01/30 0700) Pulse Rate: 82  (01/30 0700)  Labs:  Basename 09/25/12 0535 09/24/12 2325 09/24/12 1732 09/24/12 1309  HGB 9.0* -- -- 8.9*  HCT 31.5* -- -- 30.2*  PLT 168 -- -- 172  APTT -- -- -- --  LABPROT -- -- -- --  INR -- -- -- --  HEPARINUNFRC 0.30 -- -- --  CREATININE 2.46* -- -- 2.36*  CKTOTAL -- -- -- --  CKMB -- -- -- --  TROPONINI 0.97* 1.48* 2.45* --    Estimated Creatinine Clearance: 27 ml/min (by C-G formula based on Cr of 2.46).   Medications:  Scheduled:    . albuterol  2.5 mg Nebulization Q4H  . antiseptic oral rinse  15 mL Mouth Rinse q12n4p  . [COMPLETED] aspirin      . [COMPLETED] aspirin  324 mg Oral Once  . aspirin EC  81 mg Oral Daily  . chlorhexidine  15 mL Mouth Rinse BID  . Chlorhexidine Gluconate Cloth  6 each Topical Q0600  . furosemide  160 mg Intravenous BID  . [COMPLETED] furosemide  80 mg Intravenous Once  . [COMPLETED] heparin  4,000 Units Intravenous Once  . ipratropium  0.5 mg Nebulization Q4H  . levofloxacin (LEVAQUIN) IV  250 mg Intravenous Q24H  . [COMPLETED] levofloxacin (LEVAQUIN) IV  500 mg Intravenous To ER  . [COMPLETED] methylPREDNISolone (SOLU-MEDROL) injection  125 mg Intravenous Once  . mupirocin ointment  1 application Nasal BID  . predniSONE  60 mg Oral Q breakfast  . sodium chloride  3 mL Intravenous Q12H  . sodium chloride  3 mL Intravenous Q12H  . [DISCONTINUED] furosemide  80 mg Intravenous BID   Infusions:    . heparin 1,000 Units/hr (09/24/12 2029)    Assessment: 67 yo female here with SOB and possible ACS on heparin  and initial level is at low end goal (HL=0.3) with hg/hct= 9/31.5 and pltc=168.  Goal of Therapy:  Heparin level 0.3-0.7 units/ml Monitor platelets by anticoagulation protocol: Yes   Plan:  -Will increase heparin to 1100 units/hr and recheck a heparin level in 8 hrs  Harland German, Pharm D 09/25/2012 8:11 AM

## 2012-09-25 NOTE — Progress Notes (Signed)
Family Medicine Teaching Service Daily Progress Note Service Page: [phone]  Subjective:  Afebrile overnight.  RT attempted to wean patient off BiPAP last night.  Patient desaturated and became more somnolent, so BiPAP had to be resumed.  Patient states that she is feeling much improved but is eager to eat and drink. Objective: Temp:  [97.4 F (36.3 C)-99.2 F (37.3 C)] 97.5 F (36.4 C) (01/30 0400) Pulse Rate:  [81-99] 82  (01/30 0700) Resp:  [16-30] 21  (01/30 0700) BP: (83-135)/(43-77) 113/63 mmHg (01/30 0700) SpO2:  [24 %-98 %] 90 % (01/30 0700) FiO2 (%):  [40 %-50 %] 40 % (01/30 0414) Weight:  [247 lb 9.2 oz (112.3 kg)-252 lb 13.9 oz (114.7 kg)] 247 lb 9.2 oz (112.3 kg) (01/30 0500) Filed Weights   09/24/12 2000 09/25/12 0500  Weight: 252 lb 13.9 oz (114.7 kg) 247 lb 9.2 oz (112.3 kg)    Intake/Output Summary (Last 24 hours) at 09/25/12 0803 Last data filed at 09/25/12 0700  Gross per 24 hour  Intake 216.17 ml  Output   1065 ml  Net -848.83 ml   Physical Exam: General: chronically ill appearing elderly lady, resting comfortably on BiPAP Cardiovascular: RRR. No appreciable murmurs, rubs, or gallops. Respiratory: Poor air movement, distant breath sounds due to body habitus.  Expiratory wheezing noted. Abdomen: obese, nontender, protuberant.  Extremities: 2+ lower extremity edema with anterior and posterior small ulcerations with nonpurulent drainage. Neuro: AO x 3. No focal deficits  Labs:  CBC BMET   Lab 09/25/12 0535 09/24/12 1309  WBC 9.9 9.0  HGB 9.0* 8.9*  HCT 31.5* 30.2*  PLT 168 172    Lab 09/25/12 0535 09/24/12 1309  NA 145 145  K 5.3* 4.3  CL 105 107  CO2 24 28  BUN 54* 43*  CREATININE 2.46* 2.36*  GLUCOSE 163* 163*  CALCIUM 8.4 8.7     Cardiac Panel (last 3 results)  Basename 09/25/12 0535 09/24/12 2325 09/24/12 1732  CKTOTAL -- -- --  CKMB -- -- --  TROPONINI 0.97* 1.48* 2.45*  RELINDX -- -- --   Urinalysis    Component Value  Date/Time   COLORURINE AMBER* 09/24/2012 1838   APPEARANCEUR CLOUDY* 09/24/2012 1838   LABSPEC 1.020 09/24/2012 1838   PHURINE 7.0 09/24/2012 1838   GLUCOSEU NEGATIVE 09/24/2012 1838   HGBUR LARGE* 09/24/2012 1838   BILIRUBINUR SMALL* 09/24/2012 1838   KETONESUR NEGATIVE 09/24/2012 1838   PROTEINUR 100* 09/24/2012 1838   UROBILINOGEN 1.0 09/24/2012 1838   NITRITE NEGATIVE 09/24/2012 1838   LEUKOCYTESUR LARGE* 09/24/2012 1838   Imaging: Dg Chest Portable 1 View 09/24/2012  IMPRESSION:  1. Cardiomegaly and central venous congestion. 2.  Left retrocardiac opacity likely representing a hiatal hernia or ectatic aorta.  Renal Ultrasound IMPRESSION:  No evidence of renal obstruction or significant renal atrophy.  Shorter measured left kidney may be somewhat foreshortened due to  poor visualization. Incidental note is made of probable  hepatomegaly with increased echogenicity suggestive of steatosis.  Portable Chest 1 View 09/25/2012  IMPRESSION:  1.  Increased bibasilar airspace disease likely due to atelectasis and small effusions. 2.  Cardiomegaly.   Micro Urine Cx - pending Blood Cx - pending  Assessment/Plan: Anita [name] is a 67 y.o. year old female with little PMH but a long standing history of tobacco abuse who presents with acute SOB. In the ED, patient found to be in acute respiratory failure and was placed on BiPAP.  Further work up revealed acute kidney injury (  creatinine of 2.36), NSTEMI (elevated POC Troponin of 2.26), and Acute CHF with [place] proBNP of 96045. EKG revealed no ST or T wave changes.   Respiratory  # Acute Respiratory failure secondary to acute COPD exacerbation and acute CHF exacerbation- CCM was consulted in the ED. Patient admitted to their service for further observation given the severity of her illness. They are following along but we are primary and actively managing.    CHF exacerbation  - Patient diuresed 868 mL overnight following 2 doses of IV Lasix 80 mg.   Weight down 5 lbs.   - Increased Lasix to 160 mg BID today  - Cardiology following  - Echo to be obtained today.  - Patient now off BiPAP.  Currently on Venti mask 50% FIO2 with good oxygen saturations   COPD Exacerbation   - Duonebs Q4/Q2 PRN.  - Prednisone 60 mg daily (Day 1)  - IV Levaquin per pharmacy in the setting of AKI (Day 2 of abx)  Cardiac  # NSTEMI - Elevated troponin of 2.26, EKG revealed no ST or T wave changes  - Troponin trending down 2.45 --> 1.48 --> 0.97 - EKG to be obtained this am. - Cardiology following and we greatly appreciate their help in managing this patient - cath vs noninvasive study - Will continue daily Aspirin 81 mg.  Will continue heparin drip per pharmacy for 48 hours.  Renal  # Acute Kidney Injury - Likely secondary to decreased perfusion in the setting of CHF  - Creatinine rising: 2.36 --> 2.46 - Patient also mildly hyperkalemic this am at 5.3 - Will continue to monitor closely while diuresing via daily BMP   ID  # ? PNA - Chest xray revealed retrocardiac opacity that could represent focal infiltrate. Patient afebrile without leukocytosis  - UA obtained and remarkable for Large Leukocytes, too numerous to count WBC's - Patient currently on Levaquin (Day 2) which will cover both CAP and UTI  Heme/Onc  - Patient noted to be anemic with Hb of 8.9 (priors 11.9-12.9). Normocytic with MCV of 87.  - Anemia stable this am at 9.0 - FOBT negative. - Iron/TIBC/Ferritin/Reticulocytes tomorrow am.  Endo Diabetes mellitus, type 2 - New diagnosis - A1C = 6.6 - Will monitor CBG's during admission - Patient will need outpatient follow up  Skin - Wound care consulted today for lower extremity ulcers.  - Will place TED hose  FEN/GI: Clear liquid Prophylaxis: Heparin SQ  Disposition: Pending clinical improvement.  Will transfer out of ICU to step down bed today. Code Status: Full Code  Everlene Other, DO 09/25/2012, 8:03 AM

## 2012-09-25 NOTE — Progress Notes (Signed)
Interim note  Notified by RN of a newly discovered sore on abdomen and one on upper R thigh and breast mass.  Patient unaware of how long her breast mass has been there, she has never noticed.   O:   G: NAD, lying in bed with venti mask on  Resp: satting 88-92 % on 15 L venti mask at 50% Breast: R breast with large firm palpable mass approx 9 cm long by 7 cm tubular shaped extending from 3oclock towards the nipil. No skin changes, no nipil discharge.  ABD: tense, distended, R lower abdomen with approx 2 cm circular area of erythema with 3-4 mm papule with punctate opening in the center expressing small amount of puruluent fluid. No induration around lesion. Small area of induration approx 6 cm from lesion at 2 to 3 oclock with no surrounding erythema.   R thigh with erythemetous circular lesion approx 3 to 4 cm in diameter without opening  A/P 67 y/o female admited for acute respiratory distress due to COPD exacerbation and CHF exacerbation with newly found breast mass and possible small abcess.   Breast mass - large breast mass suspicious for malignance, positive Fam Hx of breast Ca.  - Arrange DG mammo outpatient  Abscess - Culture to lab, will follow up on results.  - Blood cultures pending and negative to date   Kevin Fenton, MD [phone], 09/25/2012

## 2012-09-25 NOTE — Progress Notes (Signed)
FMTS Attending Daily Note:  Renold Don MD  [phone] pager  Family Practice pager:  [phone] I have discussed this patient with the resident Dr. Adriana Simas and attending physician Dr. Leveda Anna.  I agree with their findings, assessment, and care plan

## 2012-09-25 NOTE — Progress Notes (Addendum)
  Echocardiogram 2D Echocardiogram with Definity has been performed.  Anita [name] 09/25/2012, 11:07 AM

## 2012-09-25 NOTE — H&P (Signed)
Seen and examined.  Discussed with Dr. Clinton Sawyer.  Agree with his documentation and management.  Also greatly appreciated cards and CCM/pulm help.  Briefly, 67 yo female who has not sought any medical attention despite an impressive list of medical problems.  She presents in respiratory failure.  Issues: 1. COPD exacerbation, acute on chronic, with resp failure.  Presents with significant hypoxia and with hypercarbia.  Has improved some overnight but still with borderline resp function.  We do not know how much is acute, reversible and how much is chronic.  She likely also has an element of obesity/hypoventilation syndrome (plus CHF - see #2.) 2. Cardiac.  She has all the clinical features plus lab evidence (BNP and CXR) of CHF and we are treating with diuresis.  Will likely need ACE, B Blocker spironolactone - but not now.  She has borderline BP and tenuous renal function (see below).  Also she has evidence of NSTMI.  Unclear whether this is purely a function of her hypoxia or whether she also has CAD.  That WU will need to wait until she is more stable. 3. Renal - All we can say for sure is that she has significant renal dysfunction.  I suspect this is acute on chronic, but we shall see as her clinical course unfolds.  This is complicating our diuresis. 4. Anemia - yet another problem being worked up. 5. Leg edema with skin breakdown.  Perhaps all CHF but likely has an element of chronic venous insufficiency.  Diuresis and wound consult. 6. Infection, treating with levoquin to cover COPD exac.  She looks like she also has a UTI, which levoquin should cover.  (Does not appear to have upper track disease.)  Leg wounds do not look infected.  She is MRSA positive on nasal swab.  If clinical condition deteriorates (fever, worse leukocytosis) consider adding rocephin for better urinary coverage and vanc for possibility of MRSA. 7.  Tobacco abuse - asked for a cigarette and a mellow yellow today.  Not the  response I was looking for. And she has other problems - which are covered by residents.  Again appreciate great help by CCM and cards.

## 2012-09-25 NOTE — Progress Notes (Signed)
SUBJECTIVE:  She is more awake today.  She is not actively having chest pain but she reports having chest pain at home.     PHYSICAL EXAM Filed Vitals:   09/25/12 0430 09/25/12 0500 09/25/12 0600 09/25/12 0700  BP: 120/53 122/58 135/55 113/63  Pulse: 87 87 88 82  Temp:      TempSrc:      Resp: 17 16 20 21   Height:      Weight:  247 lb 9.2 oz (112.3 kg)    SpO2: 98% 93% 91% 90%   General:  No acute distress Lungs:  Decreased breath sounds with coarse rhonchi Heart:  Distant heart sounds Abdomen:  Positive bowel sounds, no rebound no guarding Extremities:  Edema slightly improved.    LABS: Lab Results  Component Value Date   TROPONINI 0.97* 09/25/2012   Results for orders placed during the hospital encounter of 09/24/12 (from the past 24 hour(s))  CBC WITH DIFFERENTIAL     Status: Abnormal   Collection Time   09/24/12  1:09 PM      Component Value Range   WBC 9.0  4.0 - 10.5 K/uL   RBC 3.47 (*) 3.87 - 5.11 MIL/uL   Hemoglobin 8.9 (*) 12.0 - 15.0 g/dL   HCT 96.0 (*) 45.4 - 09.8 %   MCV 87.0  78.0 - 100.0 fL   MCH 25.6 (*) 26.0 - 34.0 pg   MCHC 29.5 (*) 30.0 - 36.0 g/dL   RDW 11.9 (*) 14.7 - 82.9 %   Platelets 172  150 - 400 K/uL   Neutrophils Relative 84 (*) 43 - 77 %   Lymphocytes Relative 10 (*) 12 - 46 %   Monocytes Relative 6  3 - 12 %   Eosinophils Relative 0  0 - 5 %   Basophils Relative 0  0 - 1 %   Neutro Abs 7.6  1.7 - 7.7 K/uL   Lymphs Abs 0.9  0.7 - 4.0 K/uL   Monocytes Absolute 0.5  0.1 - 1.0 K/uL   Eosinophils Absolute 0.0  0.0 - 0.7 K/uL   Basophils Absolute 0.0  0.0 - 0.1 K/uL   RBC Morphology SCHISTOCYTES PRESENT (2-5/hpf)     WBC Morphology TOXIC GRANULATION    COMPREHENSIVE METABOLIC PANEL     Status: Abnormal   Collection Time   09/24/12  1:09 PM      Component Value Range   Sodium 145  135 - 145 mEq/L   Potassium 4.3  3.5 - 5.1 mEq/L   Chloride 107  96 - 112 mEq/L   CO2 28  19 - 32 mEq/L   Glucose, Bld 163 (*) 70 - 99 mg/dL   BUN 43 (*) 6  - 23 mg/dL   Creatinine, Ser 5.62 (*) 0.50 - 1.10 mg/dL   Calcium 8.7  8.4 - 13.0 mg/dL   Total Protein 7.1  6.0 - 8.3 g/dL   Albumin 2.9 (*) 3.5 - 5.2 g/dL   AST 57 (*) 0 - 37 U/L   ALT 39 (*) 0 - 35 U/L   Alkaline Phosphatase 238 (*) 39 - 117 U/L   Total Bilirubin 0.6  0.3 - 1.2 mg/dL   GFR calc non Af Amer 20 (*) >90 mL/min   GFR calc Af Amer 24 (*) >90 mL/min  PRO B NATRIURETIC PEPTIDE     Status: Abnormal   Collection Time   09/24/12  1:09 PM      Component Value Range   Pro  B Natriuretic peptide (BNP) 17719.0 (*) 0 - 125 pg/mL  POCT I-STAT 3, BLOOD GAS (G3+)     Status: Abnormal   Collection Time   09/24/12  1:14 PM      Component Value Range   pH, Arterial 7.202 (*) 7.350 - 7.450   pCO2 arterial 70.0 (*) 35.0 - 45.0 mmHg   pO2, Arterial 46.0 (*) 80.0 - 100.0 mmHg   Bicarbonate 27.4 (*) 20.0 - 24.0 mEq/L   TCO2 29  0 - 100 mmol/L   O2 Saturation 70.0     Acid-base deficit 1.0  0.0 - 2.0 mmol/L   Patient temperature 99.2 F     Collection site RADIAL, ALLEN'S TEST ACCEPTABLE     Drawn by Operator     Sample type ARTERIAL     Comment NOTIFIED PHYSICIAN    LACTIC ACID, PLASMA     Status: Normal   Collection Time   09/24/12  1:57 PM      Component Value Range   Lactic Acid, Venous 1.2  0.5 - 2.2 mmol/L  POCT I-STAT TROPONIN I     Status: Abnormal   Collection Time   09/24/12  2:17 PM      Component Value Range   Troponin i, poc 2.26 (*) 0.00 - 0.08 ng/mL   Comment NOTIFIED PHYSICIAN     Comment 3           POCT I-STAT 3, BLOOD GAS (G3+)     Status: Abnormal   Collection Time   09/24/12  3:41 PM      Component Value Range   pH, Arterial 7.329 (*) 7.350 - 7.450   pCO2 arterial 46.9 (*) 35.0 - 45.0 mmHg   pO2, Arterial 89.0  80.0 - 100.0 mmHg   Bicarbonate 24.8 (*) 20.0 - 24.0 mEq/L   TCO2 26  0 - 100 mmol/L   O2 Saturation 96.0     Acid-base deficit 1.0  0.0 - 2.0 mmol/L   Patient temperature 97.5 F     Collection site RADIAL, ALLEN'S TEST ACCEPTABLE     Drawn by  Operator     Sample type ARTERIAL    CULTURE, BLOOD (ROUTINE X 2)     Status: Normal (Preliminary result)   Collection Time   09/24/12  5:30 PM      Component Value Range   Specimen Description BLOOD LEFT ARM     Special Requests BOTTLES DRAWN AEROBIC AND ANAEROBIC 10CC     Culture  Setup Time 09/24/2012 22:17     Culture       Value:        BLOOD CULTURE RECEIVED NO GROWTH TO DATE CULTURE WILL BE HELD FOR 5 DAYS BEFORE ISSUING A FINAL NEGATIVE REPORT   Report Status PENDING    TROPONIN I     Status: Abnormal   Collection Time   09/24/12  5:32 PM      Component Value Range   Troponin I 2.45 (*) <0.30 ng/mL  CULTURE, BLOOD (ROUTINE X 2)     Status: Normal (Preliminary result)   Collection Time   09/24/12  5:45 PM      Component Value Range   Specimen Description BLOOD LEFT HAND     Special Requests BOTTLES DRAWN AEROBIC ONLY 8CC     Culture  Setup Time 09/24/2012 22:17     Culture       Value:        BLOOD CULTURE RECEIVED NO GROWTH TO DATE CULTURE  WILL BE HELD FOR 5 DAYS BEFORE ISSUING A FINAL NEGATIVE REPORT   Report Status PENDING    URINALYSIS, MICROSCOPIC ONLY     Status: Abnormal   Collection Time   09/24/12  6:38 PM      Component Value Range   Color, Urine AMBER (*) YELLOW   APPearance CLOUDY (*) CLEAR   Specific Gravity, Urine 1.020  1.005 - 1.030   pH 7.0  5.0 - 8.0   Glucose, UA NEGATIVE  NEGATIVE mg/dL   Hgb urine dipstick LARGE (*) NEGATIVE   Bilirubin Urine SMALL (*) NEGATIVE   Ketones, ur NEGATIVE  NEGATIVE mg/dL   Protein, ur 161 (*) NEGATIVE mg/dL   Urobilinogen, UA 1.0  0.0 - 1.0 mg/dL   Nitrite NEGATIVE  NEGATIVE   Leukocytes, UA LARGE (*) NEGATIVE   WBC, UA TOO NUMEROUS TO COUNT  <3 WBC/hpf   RBC / HPF 11-20  <3 RBC/hpf   Bacteria, UA FEW (*) RARE   Squamous Epithelial / LPF FEW (*) RARE   Casts GRANULAR CAST (*) NEGATIVE   Urine-Other AMORPHOUS URATES/PHOSPHATES    OCCULT BLOOD, POC DEVICE     Status: Normal   Collection Time   09/24/12  7:04 PM       Component Value Range   Fecal Occult Bld NEGATIVE  NEGATIVE  MRSA PCR SCREENING     Status: Abnormal   Collection Time   09/24/12  8:21 PM      Component Value Range   MRSA by PCR POSITIVE (*) NEGATIVE  TROPONIN I     Status: Abnormal   Collection Time   09/24/12 11:25 PM      Component Value Range   Troponin I 1.48 (*) <0.30 ng/mL  TROPONIN I     Status: Abnormal   Collection Time   09/25/12  5:35 AM      Component Value Range   Troponin I 0.97 (*) <0.30 ng/mL  MAGNESIUM     Status: Abnormal   Collection Time   09/25/12  5:35 AM      Component Value Range   Magnesium 2.7 (*) 1.5 - 2.5 mg/dL  PHOSPHORUS     Status: Abnormal   Collection Time   09/25/12  5:35 AM      Component Value Range   Phosphorus 8.0 (*) 2.3 - 4.6 mg/dL  BASIC METABOLIC PANEL     Status: Abnormal   Collection Time   09/25/12  5:35 AM      Component Value Range   Sodium 145  135 - 145 mEq/L   Potassium 5.3 (*) 3.5 - 5.1 mEq/L   Chloride 105  96 - 112 mEq/L   CO2 24  19 - 32 mEq/L   Glucose, Bld 163 (*) 70 - 99 mg/dL   BUN 54 (*) 6 - 23 mg/dL   Creatinine, Ser 0.96 (*) 0.50 - 1.10 mg/dL   Calcium 8.4  8.4 - 04.5 mg/dL   GFR calc non Af Amer 19 (*) >90 mL/min   GFR calc Af Amer 23 (*) >90 mL/min  CBC     Status: Abnormal   Collection Time   09/25/12  5:35 AM      Component Value Range   WBC 9.9  4.0 - 10.5 K/uL   RBC 3.60 (*) 3.87 - 5.11 MIL/uL   Hemoglobin 9.0 (*) 12.0 - 15.0 g/dL   HCT 40.9 (*) 81.1 - 91.4 %   MCV 87.5  78.0 - 100.0 fL   MCH  25.0 (*) 26.0 - 34.0 pg   MCHC 28.6 (*) 30.0 - 36.0 g/dL   RDW 21.3 (*) 08.6 - 57.8 %   Platelets 168  150 - 400 K/uL  HEPARIN LEVEL (UNFRACTIONATED)     Status: Normal   Collection Time   09/25/12  5:35 AM      Component Value Range   Heparin Unfractionated 0.30  0.30 - 0.70 IU/mL    Intake/Output Summary (Last 24 hours) at 09/25/12 0826 Last data filed at 09/25/12 0700  Gross per 24 hour  Intake 216.17 ml  Output   1065 ml  Net -848.83 ml   CXR:   1. Increased bibasilar airspace disease likely due to atelectasis  and small effusions.  2. Cardiomegaly.    ASSESSMENT AND PLAN:  Elevated troponin:  Trending down.  We will follow for the timing of further evaluation (cath vs. noninvasive study).  Continue heparin and ASA. Echo today.    Acute respiratory failure:  Some component of acute pulmonary edema.  Also acute on chronic lung disease.    CKD:  Per the primary team.  Anasarca:  Edema slightly better.  Diuresis per primary team.  I agree with being relatively aggressive with IV diuresis.  Anemia:  Hgb stable.    Anita [name] [facility] 09/25/2012 8:26 AM

## 2012-09-25 NOTE — Consult Note (Signed)
WOC consult Note Reason for Consult: LE ulcerations. Wound type: appear to be ruptured blisters and some excoriation, secondary to rapid worsening of LE edema. Pt reports she has chronic LE edema "but not this bad". Does not clinically look venous, palpable pulses bilaterally  Wound FAO:[id number] lesions of the pretibial areas bilaterally, and the posterior medial left calf,they are all partial thickness with some yellow exudate all appear to be ruptured areas that were blistered at one time.  Drainage (amount, consistency, odor) minimal, serous, no odor Periwound: intact Dressing procedure/placement/frequency: silicone foam dressings to the lesions to manage exudate and protect and insulate for reepithelialization.  Do not feel these are related to venous statis but more to edema, may benefit in follow up as outpatient for light compressions hose (15-65mmhg ) to keep edema under control but effective diuresis now may reduce them enough. Would not feel safe to compress with current respiratory and cardiac status.   Re consult if needed, will not follow at this time. Thanks  Anita [name] [employer], CWOCN [phone])

## 2012-09-26 DIAGNOSIS — E119 Type 2 diabetes mellitus without complications: Secondary | ICD-10-CM | POA: Diagnosis present

## 2012-09-26 LAB — BASIC METABOLIC PANEL
Calcium: 7.4 mg/dL — ABNORMAL LOW (ref 8.4–10.5)
GFR calc Af Amer: 20 mL/min — ABNORMAL LOW (ref >90)
GFR calc non Af Amer: 18 mL/min — ABNORMAL LOW (ref >90)
Potassium: 4 mEq/L (ref 3.5–5.1)
Sodium: 137 mEq/L (ref 135–145)

## 2012-09-26 LAB — FERRITIN: Ferritin: 963 ng/mL — ABNORMAL HIGH (ref 10–291)

## 2012-09-26 LAB — RETICULOCYTES
Retic Count, Absolute: 117.7 10*3/uL (ref 19.0–186.0)
Retic Ct Pct: 3.7 % — ABNORMAL HIGH (ref 0.4–3.1)

## 2012-09-26 LAB — GLUCOSE, CAPILLARY
Glucose-Capillary: 132 mg/dL — ABNORMAL HIGH (ref 70–99)
Glucose-Capillary: 244 mg/dL — ABNORMAL HIGH (ref 70–99)
Glucose-Capillary: 226 mg/dL — ABNORMAL HIGH (ref 70–99)

## 2012-09-26 LAB — CBC
HCT: 27.6 % — ABNORMAL LOW (ref 36.0–46.0)
MCHC: 29 g/dL — ABNORMAL LOW (ref 30.0–36.0)
MCV: 86.8 fL (ref 78.0–100.0)
Platelets: 147 10*3/uL — ABNORMAL LOW (ref 150–400)
RDW: 25 % — ABNORMAL HIGH (ref 11.5–15.5)

## 2012-09-26 MED ORDER — TORSEMIDE 20 MG PO TABS
80.0000 mg | ORAL_TABLET | Freq: Two times a day (BID) | ORAL | Status: DC
  Administered 2012-09-26 – 2012-09-30 (×9): 80 mg via ORAL
  Filled 2012-09-26 (×12): qty 4

## 2012-09-26 MED ORDER — MUPIROCIN 2 % EX OINT
TOPICAL_OINTMENT | Freq: Two times a day (BID) | CUTANEOUS | Status: DC
  Administered 2012-09-26: 1 via TOPICAL
  Administered 2012-09-26 – 2012-10-01 (×8): via TOPICAL
  Administered 2012-10-02: 1 via TOPICAL
  Filled 2012-09-26 (×2): qty 22

## 2012-09-26 MED ORDER — HEPARIN SODIUM (PORCINE) 5000 UNIT/ML IJ SOLN
5000.0000 [IU] | Freq: Three times a day (TID) | INTRAMUSCULAR | Status: DC
  Administered 2012-09-26 – 2012-10-02 (×17): 5000 [IU] via SUBCUTANEOUS
  Filled 2012-09-26 (×22): qty 1

## 2012-09-26 MED FILL — Perflutren Lipid Microsphere IV Susp 1.1 MG/ML: INTRAVENOUS | Qty: 10 | Status: AC

## 2012-09-26 NOTE — Progress Notes (Signed)
SUBJECTIVE:  She is more awake today.  She is not actively having chest pain but she reports having chest pain at home.     PHYSICAL EXAM Filed Vitals:   09/26/12 0600 09/26/12 0630 09/26/12 0700 09/26/12 0730  BP: 95/52  103/62   Pulse: 97 93 88   Temp:    98 F (36.7 C)  TempSrc:    Oral  Resp: 16 21 25    Height:      Weight:      SpO2: 99% 99% 88%    General:  No acute distress Lungs:  Decreased breath sounds with coarse rhonchi Heart:  Distant heart sounds Abdomen:  Positive bowel sounds, no rebound no guarding Extremities:  Edema slightly improved.    LABS: Lab Results  Component Value Date   TROPONINI 0.97* 09/25/2012   Results for orders placed during the hospital encounter of 09/24/12 (from the past 24 hour(s))  GLUCOSE, CAPILLARY     Status: Abnormal   Collection Time   09/25/12 12:55 PM      Component Value Range   Glucose-Capillary 182 (*) 70 - 99 mg/dL  GLUCOSE, CAPILLARY     Status: Abnormal   Collection Time   09/25/12  4:04 PM      Component Value Range   Glucose-Capillary 232 (*) 70 - 99 mg/dL  CULTURE, ROUTINE-ABSCESS     Status: Normal (Preliminary result)   Collection Time   09/25/12  5:48 PM      Component Value Range   Specimen Description ABSCESS ABDOMEN RLQ BELOW PANIS     Special Requests NONE     Gram Stain       Value: NO WBC SEEN     ABUNDANT SQUAMOUS EPITHELIAL CELLS PRESENT     FEW GRAM POSITIVE COCCI IN PAIRS     RARE GRAM NEGATIVE RODS   Culture NO GROWTH     Report Status PENDING    HEPARIN LEVEL (UNFRACTIONATED)     Status: Abnormal   Collection Time   09/25/12  6:18 PM      Component Value Range   Heparin Unfractionated 0.13 (*) 0.30 - 0.70 IU/mL  GLUCOSE, CAPILLARY     Status: Abnormal   Collection Time   09/25/12  9:40 PM      Component Value Range   Glucose-Capillary 159 (*) 70 - 99 mg/dL  CBC     Status: Abnormal   Collection Time   09/26/12  5:18 AM      Component Value Range   WBC 9.3  4.0 - 10.5 K/uL   RBC 3.18  (*) 3.87 - 5.11 MIL/uL   Hemoglobin 8.0 (*) 12.0 - 15.0 g/dL   HCT 16.1 (*) 09.6 - 04.5 %   MCV 86.8  78.0 - 100.0 fL   MCH 25.2 (*) 26.0 - 34.0 pg   MCHC 29.0 (*) 30.0 - 36.0 g/dL   RDW 40.9 (*) 81.1 - 91.4 %   Platelets 147 (*) 150 - 400 K/uL  HEPARIN LEVEL (UNFRACTIONATED)     Status: Normal   Collection Time   09/26/12  5:18 AM      Component Value Range   Heparin Unfractionated 0.34  0.30 - 0.70 IU/mL  RETICULOCYTES     Status: Abnormal   Collection Time   09/26/12  5:18 AM      Component Value Range   Retic Ct Pct 3.7 (*) 0.4 - 3.1 %   RBC. 3.18 (*) 3.87 - 5.11 MIL/uL  Retic Count, Manual 117.7  19.0 - 186.0 K/uL  BASIC METABOLIC PANEL     Status: Abnormal   Collection Time   09/26/12  5:18 AM      Component Value Range   Sodium 137  135 - 145 mEq/L   Potassium 4.0  3.5 - 5.1 mEq/L   Chloride 100  96 - 112 mEq/L   CO2 25  19 - 32 mEq/L   Glucose, Bld 153 (*) 70 - 99 mg/dL   BUN 67 (*) 6 - 23 mg/dL   Creatinine, Ser 1.61 (*) 0.50 - 1.10 mg/dL   Calcium 7.4 (*) 8.4 - 10.5 mg/dL   GFR calc non Af Amer 18 (*) >90 mL/min   GFR calc Af Amer 20 (*) >90 mL/min    Intake/Output Summary (Last 24 hours) at 09/26/12 0801 Last data filed at 09/26/12 0700  Gross per 24 hour  Intake 777.95 ml  Output   2570 ml  Net -1792.05 ml     ASSESSMENT AND PLAN:  Elevated troponin:  Trending down.  We will follow for the timing of further evaluation.  Given her renal insufficiency this will likely need to be noninvasive for risk stratification.  Continue heparin and ASA.   EF 55%.  No obvious wall motion abnormalities.    Acute respiratory failure:  Some component of acute pulmonary edema.  Also acute on chronic lung disease.    CKD:  Per the primary team.  Anasarca:  Edema slightly better.  Diuresis per primary team.  Anemia:  Lower today.  Guaiac collected but not resulted.  OK to discontinue full dose heparin.     Fayrene Fearing [facility] 09/26/2012 8:01 AM

## 2012-09-26 NOTE — Progress Notes (Addendum)
FMTS Attending Daily Note:  Renold Don MD  [phone] pager  Family Practice pager:  [phone] I have seen and examined this patient and have reviewed their chart. I have discussed this patient with the resident. I agree with the resident's findings, assessment and care plan.  Additionally: - Pt awake, alert, complaining about not being able to get out of bed. Coarse lungs sounds thoughout. - Pt denies dyspnea at rest, pulse ox 87 - 88% on 15 L venti mask.  Please note that Nonrebreather is documented in chart, however she is NOT wearing this but rather has on Venti mask.   - Appreciate cardiology and pulmonology inputs.   - Believe likely combination of COPD/CHF.  Continue diuresis.  Question of PNA as well.  Continue bronchodilators /Levaquin/Prednisone - Assess for any signs of hypercarbia, none present currently. - Follow renal function.  Follow Hgb.  Agree with stopping heparin drip.  - Agree with breast mass management. Needs mammogram.

## 2012-09-26 NOTE — Progress Notes (Signed)
Family Medicine Teaching Service Daily Progress Note Service Page: [phone]  Subjective:  Yesterday, RN noted sore on abdomen and R upper thigh.  She also noted oddly-shapen breast.  Dr. Ermalinda Memos saw patient yesterday and cultured abscess.  Physical exam revealed a large breast mass (likely suspicious for malignancy).    Feeling improved this am but still SOB.  Wants to advance diet. Objective: Temp:  [97.6 F (36.4 C)-98.7 F (37.1 C)] 98 F (36.7 C) (01/31 0800) Pulse Rate:  [86-99] 88  (01/31 0700) Resp:  [16-26] 25  (01/31 0700) BP: (95-135)/(52-89) 103/62 mmHg (01/31 0700) SpO2:  [86 %-99 %] 91 % (01/31 0909) FiO2 (%):  [40 %-50 %] 50 % (01/31 0909) Weight:  [247 lb 5.7 oz (112.2 kg)] 247 lb 5.7 oz (112.2 kg) (01/31 0455) Filed Weights   09/24/12 2000 09/25/12 0500 09/26/12 0455  Weight: 252 lb 13.9 oz (114.7 kg) 247 lb 9.2 oz (112.3 kg) 247 lb 5.7 oz (112.2 kg)    Intake/Output Summary (Last 24 hours) at 09/26/12 0954 Last data filed at 09/26/12 0800  Gross per 24 hour  Intake 778.95 ml  Output   2570 ml  Net -1791.05 ml   Physical Exam: General: chronically ill appearing elderly lady, resting comfortably on BiPAP Cardiovascular: RRR. No appreciable murmurs, rubs, or gallops. Respiratory: Coarse breath sounds and expiratory wheezing throughout. Breast: Large firm lateral breast mass - approximately 9 x 7 cm.  No skin changes appreciated. Abdomen: obese, nontender, protuberant.  Extremities: 2+ lower extremity edema with anterior and posterior small ulcerations with nonpurulent drainage. Neuro: AO x 3. No focal deficits Skin: R lower abdomen - ~ 2 cm circular area of erythema with 3-4 mm papule with punctate opening in the center.    Labs:  CBC BMET   Lab 09/26/12 0518 09/25/12 0535 09/24/12 1309  WBC 9.3 9.9 9.0  HGB 8.0* 9.0* 8.9*  HCT 27.6* 31.5* 30.2*  PLT 147* 168 172    Lab 09/26/12 0518 09/25/12 0535 09/24/12 1309  NA 137 145 145  K 4.0 5.3* 4.3  CL  100 105 107  CO2 25 24 28   BUN 67* 54* 43*  CREATININE 2.68* 2.46* 2.36*  GLUCOSE 153* 163* 163*  CALCIUM 7.4* 8.4 8.7     Cardiac Panel (last 3 results)  Basename 09/25/12 0535 09/24/12 2325 09/24/12 1732  CKTOTAL -- -- --  CKMB -- -- --  TROPONINI 0.97* 1.48* 2.45*  RELINDX -- -- --   Urinalysis    Component Value Date/Time   COLORURINE AMBER* 09/24/2012 1838   APPEARANCEUR CLOUDY* 09/24/2012 1838   LABSPEC 1.020 09/24/2012 1838   PHURINE 7.0 09/24/2012 1838   GLUCOSEU NEGATIVE 09/24/2012 1838   HGBUR LARGE* 09/24/2012 1838   BILIRUBINUR SMALL* 09/24/2012 1838   KETONESUR NEGATIVE 09/24/2012 1838   PROTEINUR 100* 09/24/2012 1838   UROBILINOGEN 1.0 09/24/2012 1838   NITRITE NEGATIVE 09/24/2012 1838   LEUKOCYTESUR LARGE* 09/24/2012 1838   Imaging:  2DEcho: Study Conclusions - Left ventricle: The cavity size was normal. Wall thicknesswas normal. The estimated ejection fraction was 55%. Although no diagnostic regional wall motion abnormality was identified, this possibility cannot be completely excluded on the basis of this study. Features are consistent with a pseudonormal left ventricular filling pattern, with concomitant abnormal relaxation and increased filling pressure (grade 2 diastolic dysfunction). - Ventricular septum: D-shaped interventricular septum suggests RV pressure and volume overload. - Aortic valve: There was no stenosis. - Mitral valve: Mildly calcified annulus. - Right ventricle: Poorly visualized.  The cavity size was mildly dilated. Systolic function was normal. - Right atrium: The atrium was mildly dilated. - Pulmonary arteries: No complete TR doppler jet so unable to estimate PA systolic pressure. - Systemic veins: IVC was not visualized. Impressions: - Technically difficult study with poor acoustic windows. Definity contrast was used. Normal LV size with probably normal overall systolic function, EF 55% . No definite wall motion abnormalities were seen when  Definity was used. Moderate diastolic dysfunction. The RV was never well-visualized . There was leftwards shift of the interventricular septum suggesting RV pressure and volume overload. The RV appeared mildly dilated with normal systolic function.  Dg Chest Portable 1 View 09/24/2012  IMPRESSION:  1. Cardiomegaly and central venous congestion. 2.  Left retrocardiac opacity likely representing a hiatal hernia or ectatic aorta.  Renal Ultrasound IMPRESSION:  No evidence of renal obstruction or significant renal atrophy.  Shorter measured left kidney may be somewhat foreshortened due to  poor visualization. Incidental note is made of probable  hepatomegaly with increased echogenicity suggestive of steatosis.  Portable Chest 1 View 09/25/2012  IMPRESSION:  1.  Increased bibasilar airspace disease likely due to atelectasis and small effusions. 2.  Cardiomegaly.   Micro Urine Cx - pending culture reincubated for better growth Blood Cx - Negative to Date  Assessment/Plan: Anita [name] is a 67 y.o. year old female with little PMH but a long standing history of tobacco abuse who presents with acute SOB. In the ED, patient found to be in acute respiratory failure and was placed on BiPAP.  Further work up revealed acute kidney injury (creatinine of 2.36), NSTEMI (elevated POC Troponin of 2.26), and Acute CHF with [place] proBNP of 09811. EKG revealed no ST or T wave changes.   Respiratory  # Acute Respiratory failure secondary to acute COPD exacerbation and acute CHF exacerbation - Patient now out of ICU and in stepdown bed.  CHF exacerbation  - Cardiology following and we greatly appreciate their help in managing this patient.  - Patient diuresed 1791 over the past 24 hours  - Patient lost 1 PIV yesterday and IV Team/Nursing unable to start new IV.  Will transition to PO Torsemide      80 mg BID today.  She did not receive complete second dose of Lasix yesterday.  - Patient now off BiPAP.   Currently on Venti mask 50% FIO2 15 L.   COPD Exacerbation   - Duonebs Q4/Q2 PRN.  - Prednisone 60 mg daily (Day 2)  - Switched to PO Levaquin (Day 3 of abx)  Cardiac  # NSTEMI - Elevated troponin of 2.26, EKG revealed no ST or T wave changes  - Troponin trending down 2.45 --> 1.48 --> 0.97 - Cardiology following and we greatly appreciate their help in managing this patient - cath vs noninvasive study - Will continue daily Aspirin 81 mg.  Discontinuing heparin gtt today per cardiology recommendations.  Renal  # Acute Kidney Injury - Likely secondary to decreased perfusion in the setting of CHF  - Creatinine rising: 2.36 --> 2.46 --> 2.68 - Patient still volume overloaded.  Will continue diuresis with Toresemide (see above)  ID  # ? PNA - Chest xray revealed retrocardiac opacity that could represent focal infiltrate. Patient afebrile without leukocytosis  - UA obtained and remarkable for Large Leukocytes, too numerous to count WBC's - Patient currently on Levaquin (Day 3) which will cover both CAP and UTI  Heme/Onc  # Anemia - Patient noted to be anemic  with Hb of 8.9 (priors 11.9-12.9). Normocytic with MCV of 87.  - Anemia down at 8.0 this am.  Likely secondary to loss from bleeding associated with IV attempts (noted to have bleeding requiring pressure for >30 mins) - FOBT negative. - Iron/TIBC/Ferritin/Reticulocytes today  # Breast mass - Large, suspicious for malignancy - Patient will need outpatient mammogram  Endo Diabetes mellitus, type 2 - New diagnosis - A1C = 6.6 - Will monitor CBG's during admission.  SSI TID AC and QHS - Patient will need outpatient follow up  Skin # Lower extremity ulcers - Wound care consulted for lower extremity ulcers.  Silicone foam dressings in place.  # Abscess - R lower abdomen - Drainage expressed and cultured yesterday - Awaiting results. - Mupirocin BID  FEN/GI: Clear liquid Prophylaxis: Heparin SQ. Disposition: Pending clinical  improvement. Code Status: Full Code  Everlene Other, DO 09/26/2012, 9:54 AM

## 2012-09-26 NOTE — Progress Notes (Signed)
ANTIBIOTIC CONSULT NOTE - FOLLOW UP  Pharmacy Consult for Levaquin Indication: CAP  Vital Signs: Temp: 98 F (36.7 C) (01/31 0730) Temp src: Oral (01/31 0730) BP: 103/62 mmHg (01/31 0700) Pulse Rate: 88  (01/31 0700) Intake/Output from previous day: 01/30 0701 - 01/31 0700 In: 1104 [P.O.:480; I.V.:492; IV Piggyback:132] Out: 2670 [Urine:2670] Intake/Output from this shift:    Labs:  Basename 09/26/12 0518 09/25/12 0535 09/24/12 1309  WBC 9.3 9.9 9.0  HGB 8.0* 9.0* 8.9*  PLT 147* 168 172  LABCREA -- -- --  CREATININE 2.68* 2.46* 2.36*   Estimated Creatinine Clearance: 24.7 ml/min (by C-G formula based on Cr of 2.68). No results found for this basename: VANCOTROUGH:2,VANCOPEAK:2,VANCORANDOM:2,GENTTROUGH:2,GENTPEAK:2,GENTRANDOM:2,TOBRATROUGH:2,TOBRAPEAK:2,TOBRARND:2,AMIKACINPEAK:2,AMIKACINTROU:2,AMIKACIN:2, in the last 72 hours   Microbiology: Recent Results (from the past 720 hour(s))  CULTURE, BLOOD (ROUTINE X 2)     Status: Normal (Preliminary result)   Collection Time   09/24/12  5:30 PM      Component Value Range Status Comment   Specimen Description BLOOD LEFT ARM   Final    Special Requests BOTTLES DRAWN AEROBIC AND ANAEROBIC 10CC   Final    Culture  Setup Time 09/24/2012 22:17   Final    Culture     Final    Value:        BLOOD CULTURE RECEIVED NO GROWTH TO DATE CULTURE WILL BE HELD FOR 5 DAYS BEFORE ISSUING A FINAL NEGATIVE REPORT   Report Status PENDING   Incomplete   CULTURE, BLOOD (ROUTINE X 2)     Status: Normal (Preliminary result)   Collection Time   09/24/12  5:45 PM      Component Value Range Status Comment   Specimen Description BLOOD LEFT HAND   Final    Special Requests BOTTLES DRAWN AEROBIC ONLY 8CC   Final    Culture  Setup Time 09/24/2012 22:17   Final    Culture     Final    Value:        BLOOD CULTURE RECEIVED NO GROWTH TO DATE CULTURE WILL BE HELD FOR 5 DAYS BEFORE ISSUING A FINAL NEGATIVE REPORT   Report Status PENDING   Incomplete   URINE  CULTURE     Status: Normal (Preliminary result)   Collection Time   09/24/12  6:38 PM      Component Value Range Status Comment   Specimen Description URINE, CATHETERIZED   Final    Special Requests NONE   Final    Culture  Setup Time 09/25/2012 03:10   Final    Colony Count PENDING   Incomplete    Culture Culture reincubated for better growth   Final    Report Status PENDING   Incomplete   MRSA PCR SCREENING     Status: Abnormal   Collection Time   09/24/12  8:21 PM      Component Value Range Status Comment   MRSA by PCR POSITIVE (*) NEGATIVE Final   CULTURE, ROUTINE-ABSCESS     Status: Normal (Preliminary result)   Collection Time   09/25/12  5:48 PM      Component Value Range Status Comment   Specimen Description ABSCESS ABDOMEN RLQ BELOW PANIS   Final    Special Requests NONE   Final    Gram Stain     Final    Value: NO WBC SEEN     ABUNDANT SQUAMOUS EPITHELIAL CELLS PRESENT     FEW GRAM POSITIVE COCCI IN PAIRS     RARE GRAM NEGATIVE  RODS   Culture NO GROWTH   Final    Report Status PENDING   Incomplete     Anti-infectives     Start     Dose/Rate Route Frequency Ordered Stop   09/25/12 2100   levofloxacin (LEVAQUIN) tablet 250 mg        250 mg Oral Daily 09/25/12 2007     09/25/12 1800   Levofloxacin (LEVAQUIN) IVPB 250 mg  Status:  Discontinued        250 mg 50 mL/hr over 60 Minutes Intravenous Every 24 hours 09/24/12 1744 09/25/12 2005   09/24/12 1800   levofloxacin (LEVAQUIN) IVPB 500 mg        500 mg 100 mL/hr over 60 Minutes Intravenous To Emergency Dept 09/24/12 1744 09/24/12 1934          Assessment: 67 year old female on levaquin day #3 for CAP, now with newly found abdominal sore which was cultured yesterday. No fevers overnight and wbc is stable. Renal function has worsened slightly but levaquin dosing has already been adjusted.  /29 levaquin>  1/29 blood x2-ngtd 1/29 urine-ng 1/30 abd abscess - rare GNR  Goal of Therapy:  Eradication of  infection Plan:  Follow up culture results Continue levaquin 250mg  q24 hours  Severiano Gilbert 09/26/2012,8:34 AM

## 2012-09-26 NOTE — Progress Notes (Signed)
Inpatient Diabetes Program Recommendations  AACE/ADA: New Consensus Statement on Inpatient Glycemic Control  Target Ranges:  Prepandial:   less than 140 mg/dL      Peak postprandial:   less than 180 mg/dL (1-2 hours)      Critically ill patients:  140 - 180 mg/dL  Pager:  [phone] Hours:  8 am-10pm   Reason for Visit: Elevated HgbA1C:  6.6%  Inpatient Diabetes Program Recommendations HgbA1C: Please address elevated HgbA1C and elevated glucose: ? new diagnosis of Diabetes  Alfredia Client PhD, RN, BC-ADM Diabetes Coordinator  Office:  [phone] Team Pager:  [phone]

## 2012-09-26 NOTE — Discharge Summary (Signed)
Family Medicine Teaching [facility] Discharge Summary  Patient name: Anita [name] Medical record number: [id number] Date of birth: 06-29-1946 Age: 67 y.o. Gender: female Date of Admission: 09/24/2012  Date of Discharge: 10/02/12 Admitting Physician: Sanjuana Letters, MD  Primary Care Provider: Patient had no primary care provider on admission-appointment set up with Dr. Everlene Other [facility]  Indication for Hospitalization: Acute SOB and respiratory failure  Discharge Diagnoses:  Acute Respiratory failure  Acute COPD exacerbation Acute diastolic CHF exacerbation NSTEMI Acute Kidney Injury Anemia Breast mass Skin abscess Diabetes Mellitus Hypocalcemia Hypokalemia  Brief Hospital Course:  Anita [name] is a 67 y.o. year old female with little PMH but a long standing history of tobacco abuse who presented with acute SOB. In the ED, patient found to be in acute respiratory failure and was placed on BiPAP. Further work up revealed acute kidney injury (creatinine of 2.36), NSTEMI (elevated POC Troponin of 2.26), and Acute CHF with elevated proBNP of 11914.  EKG revealed no ST or T wave changes.   1) Acute respiratory failure secondary to acute diastolic CHF exacerbation and acute COPD exacerbation - Acute Diastolic CHF exacerbation: Patient was clinically volume overloaded on admission with elevated proBNP of 78295 and 2+ pitting lower extremity edema with weeping skin ulcerations.  Echo was obtained during admission and revealed grade 2 diastolic dysfunction and evidence of increased RV pressure and volume overload.  Anita [name] was aggressively diuresed with IV Lasix (160 mg BID) and subsequently weaned to PO Toresemide 40 mg BID. She displayed improved edema at time of discharge. She was slowly weaned off BiPAP during the day, and subsequently was supposed to use this at night though refused this most nights. Patient weaned to nasal cannula with oximizer at time of  discharge.  - Acute COPD exacerbation: Patient has a long standing history of tobacco abuse and had significant wheezing on physical examination.  She was treated initially IV Solumedrol in the ED.  She was then treated with PO Prednisone 60 mg, Levaquin, and Duonebs.  Her SOB improved and she was discharged home on 2 additional days of levaquin, albuterol prn, and spiriva. ABG prior to discharge revealed pH 7.415, PCO2 62.5, and PO2 60.7 while on venti mask at 15 L.  2) NSTEMI - In the ED, patient was found to have an elevated point of care Troponin of 2.26.  Cardiology was consulted in the ED and 3 sets of Troponin's were then obtained. In the setting of elevated Troponin patient was started on aspirin and heparin drip.  Troponin trended down from 2.45 to 0.97 (see below for full details) and heparin drip was then discontinued.  Had no additional episodes of chest pain while hospitalized.  3) Acute Kidney Injury - Patient's prior creatinine was 1.02 (05/2012). - Creatinine was markedly elevated at 2.36 on admission. - Creatinine was monitored closely during admission via daily BMP's. - patient was diuresed 22 L throughout the hospitalization and Cr trended down to 1.57 at time of discharge.  4) Anemia - Normocytic, likely anemia of chronic disease. - Patient noted to be anemic with Hb of 8.9 (priors 11.9-12.9). Normocytic with MCV of 87.  - Iron/TIBC were normal.  Ferritin was elevated at 963.  - Hb was monitored closely during admission and was stable at time of discharge.  5) Breast mass - On 1/30, nursing noted breast asymetry while patient was being bathed - Physical exam revealed a large, lateral breast mass - approximately 9 x 7  cm.  - Patient will need outpatient mammogram and possible CT chest  6) Skin abscess - During admission, nursing noted small skin abscess on patient's R lower abdomen.  Abscess drainage was expressed and culture was obtained.  Culture grew GBS. This was covered  by levaquin per the antibiogram. - Additionally treated with topical mupirocin.  7) questionable PNA: CXR revealed retrocardiac opacity that could represent focal infiltrate. Patient was afebrile throughout hospitalization with only mild bump in WBC to 14.6 (likely related to steroids). This was treated with levaquin covering for COPD exacerbation.  8) DM, type II-new diagnosis with A1c of 6.6. Patient was started on SSI AC and qhs. CBGs maintained between 100'-300's.  9) LE ulcers: bilateral ulcers. Wound care was consulted and patient had silicone foam dressings placed. Healing well at time of discharge with only right sided lesion draining minimal purulent fluid.  10) Hypocalcemia: patient found to have low calcium throughout hospitalization. Was found to have elevated PTH level to 258. Possibly related to acute renal failure vs malabsorption. She was started on a daily supplement with meals. Obtained TSH 0.241 and T4 1.04.  11) Hypokalemia: patient with continued low potassium throughout hospitalization. Likely related to massive diuresis. Consider rechecking potassium as outpatient.   Significant Labs and Imaging:   CBC BMET   Lab 10/02/12 0505 10/01/12 0555 09/30/12 0600  WBC 14.1* 13.8* 12.6*  HGB 9.7* 9.2* 8.9*  HCT 32.5* 31.1* 29.5*  PLT 202 189 152    Lab 10/02/12 0505 10/01/12 0555 09/30/12 0600  NA 141 143 142  K 3.4* 3.0* 2.7*  CL 90* 89* 84*  CO2 38* 38* 41*  BUN 90* 97* 99*  CREATININE 1.57* 1.83* 1.97*  GLUCOSE 164* 177* 151*  CALCIUM 6.9* 6.4* 6.6*     POC Troponin - 2.26 (on admission)-->0.97  BNP    Component Value Date/Time   PROBNP 17719.0* 09/24/2012 1309   Iron/TIBC/Ferritin    Component Value Date/Time   IRON 124 09/26/2012 0518   TIBC 293 09/26/2012 0518   FERRITIN 963* 09/26/2012 0518   Results for orders placed during the hospital encounter of 09/24/12 (from the past 72 hour(s))  GLUCOSE, CAPILLARY     Status: Abnormal   Collection Time    09/29/12  4:46 PM      Component Value Range Comment   Glucose-Capillary 250 (*) 70 - 99 mg/dL   GLUCOSE, CAPILLARY     Status: Abnormal   Collection Time   09/29/12  9:17 PM      Component Value Range Comment   Glucose-Capillary 237 (*) 70 - 99 mg/dL   CBC     Status: Abnormal   Collection Time   09/30/12  6:00 AM      Component Value Range Comment   WBC 12.6 (*) 4.0 - 10.5 K/uL    RBC 3.55 (*) 3.87 - 5.11 MIL/uL    Hemoglobin 8.9 (*) 12.0 - 15.0 g/dL    HCT 08.6 (*) 57.8 - 46.0 %    MCV 83.1  78.0 - 100.0 fL    MCH 25.1 (*) 26.0 - 34.0 pg    MCHC 30.2  30.0 - 36.0 g/dL    RDW 46.9 (*) 62.9 - 15.5 %    Platelets 152  150 - 400 K/uL   BASIC METABOLIC PANEL     Status: Abnormal   Collection Time   09/30/12  6:00 AM      Component Value Range Comment   Sodium 142  135 -  145 mEq/L    Potassium 2.7 (*) 3.5 - 5.1 mEq/L    Chloride 84 (*) 96 - 112 mEq/L    CO2 41 (*) 19 - 32 mEq/L    Glucose, Bld 151 (*) 70 - 99 mg/dL    BUN 99 (*) 6 - 23 mg/dL    Creatinine, Ser 0.86 (*) 0.50 - 1.10 mg/dL    Calcium 6.6 (*) 8.4 - 10.5 mg/dL    GFR calc non Af Amer 25 (*) >90 mL/min    GFR calc Af Amer 30 (*) >90 mL/min   TSH     Status: Abnormal   Collection Time   09/30/12  6:00 AM      Component Value Range Comment   TSH 0.241 (*) 0.350 - 4.500 uIU/mL   T4, FREE     Status: Normal   Collection Time   09/30/12  6:00 AM      Component Value Range Comment   Free T4 1.04  0.80 - 1.80 ng/dL   HEPATIC FUNCTION PANEL     Status: Abnormal   Collection Time   09/30/12  6:00 AM      Component Value Range Comment   Total Protein 6.3  6.0 - 8.3 g/dL    Albumin 3.1 (*) 3.5 - 5.2 g/dL    AST 18  0 - 37 U/L    ALT 25  0 - 35 U/L    Alkaline Phosphatase 116  39 - 117 U/L    Total Bilirubin 0.5  0.3 - 1.2 mg/dL    Bilirubin, Direct 0.2  0.0 - 0.3 mg/dL    Indirect Bilirubin 0.3  0.3 - 0.9 mg/dL   GLUCOSE, CAPILLARY     Status: Abnormal   Collection Time   09/30/12  8:26 AM      Component Value Range Comment    Glucose-Capillary 146 (*) 70 - 99 mg/dL   GLUCOSE, CAPILLARY     Status: Abnormal   Collection Time   09/30/12 11:50 AM      Component Value Range Comment   Glucose-Capillary 240 (*) 70 - 99 mg/dL   PROTEIN / CREATININE RATIO, URINE     Status: Abnormal   Collection Time   09/30/12 12:52 PM      Component Value Range Comment   Creatinine, Urine 33.54      Total Protein, Urine 10.6   NO NORMAL RANGE ESTABLISHED FOR THIS TEST   PROTEIN CREATININE RATIO 0.32 (*) 0.00 - 0.15   GLUCOSE, CAPILLARY     Status: Abnormal   Collection Time   09/30/12  6:10 PM      Component Value Range Comment   Glucose-Capillary 415 (*) 70 - 99 mg/dL    Comment 1 Documented in Chart      Comment 2 Notify RN     GLUCOSE, CAPILLARY     Status: Abnormal   Collection Time   09/30/12  8:44 PM      Component Value Range Comment   Glucose-Capillary 216 (*) 70 - 99 mg/dL    Comment 1 Notify RN     GLUCOSE, CAPILLARY     Status: Abnormal   Collection Time   10/01/12  5:47 AM      Component Value Range Comment   Glucose-Capillary 166 (*) 70 - 99 mg/dL   CBC     Status: Abnormal   Collection Time   10/01/12  5:55 AM      Component Value Range Comment   WBC  13.8 (*) 4.0 - 10.5 K/uL    RBC 3.71 (*) 3.87 - 5.11 MIL/uL    Hemoglobin 9.2 (*) 12.0 - 15.0 g/dL    HCT 16.1 (*) 09.6 - 46.0 %    MCV 83.8  78.0 - 100.0 fL    MCH 24.8 (*) 26.0 - 34.0 pg    MCHC 29.6 (*) 30.0 - 36.0 g/dL    RDW 04.5 (*) 40.9 - 15.5 %    Platelets 189  150 - 400 K/uL   BASIC METABOLIC PANEL     Status: Abnormal   Collection Time   10/01/12  5:55 AM      Component Value Range Comment   Sodium 143  135 - 145 mEq/L    Potassium 3.0 (*) 3.5 - 5.1 mEq/L    Chloride 89 (*) 96 - 112 mEq/L    CO2 38 (*) 19 - 32 mEq/L    Glucose, Bld 177 (*) 70 - 99 mg/dL    BUN 97 (*) 6 - 23 mg/dL    Creatinine, Ser 8.11 (*) 0.50 - 1.10 mg/dL    Calcium 6.4 (*) 8.4 - 10.5 mg/dL    GFR calc non Af Amer 28 (*) >90 mL/min    GFR calc Af Amer 32 (*) >90 mL/min    BLOOD GAS, ARTERIAL     Status: Abnormal   Collection Time   10/01/12  8:20 AM      Component Value Range Comment   FIO2 0.50      Delivery systems VENTURI MASK      pH, Arterial 7.415  7.350 - 7.450    pCO2 arterial 62.5 (*) 35.0 - 45.0 mmHg    pO2, Arterial 60.7 (*) 80.0 - 100.0 mmHg    Bicarbonate 39.3 (*) 20.0 - 24.0 mEq/L    TCO2 41.2  0 - 100 mmol/L    Acid-Base Excess 13.9 (*) 0.0 - 2.0 mmol/L    O2 Saturation 90.5      Patient temperature 98.6      Collection site LEFT RADIAL      Drawn by [id number]      Sample type ARTERIAL DRAW      Allens test (pass/fail) PASS  PASS   GLUCOSE, CAPILLARY     Status: Abnormal   Collection Time   10/01/12 12:11 PM      Component Value Range Comment   Glucose-Capillary 278 (*) 70 - 99 mg/dL    Comment 1 Documented in Chart      Comment 2 Notify RN     GLUCOSE, CAPILLARY     Status: Abnormal   Collection Time   10/01/12  4:54 PM      Component Value Range Comment   Glucose-Capillary 255 (*) 70 - 99 mg/dL   GLUCOSE, CAPILLARY     Status: Abnormal   Collection Time   10/01/12  9:27 PM      Component Value Range Comment   Glucose-Capillary 171 (*) 70 - 99 mg/dL    Comment 1 Notify RN     CBC     Status: Abnormal   Collection Time   10/02/12  5:05 AM      Component Value Range Comment   WBC 14.1 (*) 4.0 - 10.5 K/uL    RBC 3.84 (*) 3.87 - 5.11 MIL/uL    Hemoglobin 9.7 (*) 12.0 - 15.0 g/dL    HCT 95.6 (*) 21.3 - 46.0 %    MCV 84.6  78.0 - 100.0 fL  MCH 25.3 (*) 26.0 - 34.0 pg    MCHC 29.8 (*) 30.0 - 36.0 g/dL    RDW 16.1 (*) 09.6 - 15.5 %    Platelets 202  150 - 400 K/uL   BASIC METABOLIC PANEL     Status: Abnormal   Collection Time   10/02/12  5:05 AM      Component Value Range Comment   Sodium 141  135 - 145 mEq/L    Potassium 3.4 (*) 3.5 - 5.1 mEq/L    Chloride 90 (*) 96 - 112 mEq/L    CO2 38 (*) 19 - 32 mEq/L    Glucose, Bld 164 (*) 70 - 99 mg/dL    BUN 90 (*) 6 - 23 mg/dL    Creatinine, Ser 0.45 (*) 0.50 - 1.10 mg/dL    Calcium  6.9 (*) 8.4 - 10.5 mg/dL    GFR calc non Af Amer 34 (*) >90 mL/min    GFR calc Af Amer 39 (*) >90 mL/min   GLUCOSE, CAPILLARY     Status: Abnormal   Collection Time   10/02/12  7:14 AM      Component Value Range Comment   Glucose-Capillary 147 (*) 70 - 99 mg/dL   GLUCOSE, CAPILLARY     Status: Abnormal   Collection Time   10/02/12 11:13 AM      Component Value Range Comment   Glucose-Capillary 128 (*) 70 - 99 mg/dL     2DEcho:  Study Conclusions - Left ventricle: The cavity size was normal. Wall thicknesswas normal. The estimated ejection fraction was 55%. Although no diagnostic regional wall motion abnormality was identified, this possibility cannot be completely excluded on the basis of this study. Features are consistent with a pseudonormal left ventricular filling pattern, with concomitant abnormal relaxation and increased filling pressure (grade 2 diastolic dysfunction). - Ventricular septum: D-shaped interventricular septum suggests RV pressure and volume overload. - Aortic valve: There was no stenosis. - Mitral valve: Mildly calcified annulus. - Right ventricle: Poorly visualized. The cavity size was mildly dilated. Systolic function was normal. - Right atrium: The atrium was mildly dilated. - Pulmonary arteries: No complete TR doppler jet so unable to estimate PA systolic pressure. - Systemic veins: IVC was not visualized. Impressions: - Technically difficult study with poor acoustic windows. Definity contrast was used. Normal LV size with probably normal overall systolic function, EF 55% . No definite wall motion abnormalities were seen when Definity was used. Moderate diastolic dysfunction. The RV was never well-visualized . There was leftwards shift of the interventricular septum suggesting RV pressure and volume overload. The RV appeared mildly dilated with normal systolic function.  Dg Chest Portable 1 View  09/24/2012 IMPRESSION: 1. Cardiomegaly and central venous  congestion. 2. Left retrocardiac opacity likely representing a hiatal hernia or ectatic aorta.   Portable Chest 1 View  09/25/2012 IMPRESSION: 1. Increased bibasilar airspace disease likely due to atelectasis and small effusions. 2. Cardiomegaly.   Renal Ultrasound  IMPRESSION:  No evidence of renal obstruction or significant renal atrophy. Shorter measured left kidney may be somewhat foreshortened due to poor visualization. Incidental note is made of probable hepatomegaly with increased echogenicity suggestive of steatosis.   Procedures: none  Consultations: Cardiology Dr. Antoine Poche, Critical Care medicine Dr. Molli Knock (and colleagues)   Discharge Medications:    Medication List     As of 10/02/2012  2:10 PM    TAKE these medications         albuterol 108 (90 BASE) MCG/ACT inhaler  Commonly known as: PROVENTIL HFA;VENTOLIN HFA   Inhale 2 puffs into the lungs every 6 (six) hours as needed for wheezing.      aspirin 81 MG EC tablet   Take 1 tablet (81 mg total) by mouth daily.      atorvastatin 40 MG tablet   Commonly known as: LIPITOR   Take 1 tablet (40 mg total) by mouth daily at 6 PM.      calcium citrate 950 MG tablet   Commonly known as: CALCITRATE - dosed in mg elemental calcium   Take 1 tablet (200 mg of elemental calcium total) by mouth 3 (three) times daily with meals.      levofloxacin 250 MG tablet   Commonly known as: LEVAQUIN   Take 1 tablet (250 mg total) by mouth daily.      tiotropium 18 MCG inhalation capsule   Commonly known as: SPIRIVA   Place 1 capsule (18 mcg total) into inhaler and inhale daily.      torsemide 20 MG tablet   Commonly known as: DEMADEX   Take 2 tablets (40 mg total) by mouth 2 (two) times daily.        Issues for Follow Up:  1. COPD controller medications-started on spiriva at discharge 2. Continued management of CHF-discharged on torsemide 40 mg BID 3. Oxygen requirement-discharged on 4L [place] with oximizer in addition to BiPAP,  unsure at this time whether or not she will be able to obtain the BiPAP through home health with out a sleep study. If needed please coordinate sleep study. Per Dr. Delton Coombes with her most recent ABG with elevated PCO2 she would not need sleep study to obtain this. Has follow-up already with Pulmonology. 4. Breast mass: needs outpatient mammogram 5. Anemia: likely related to chronic disease, stable at time of discharge, will need to follow this 6. New onset DM: not discharged on any medications. Will likely need trial of life style changes vs medication (no metformin given renal dysfunction at this time). 7. Abdominal abscess and lower extremity ulcer: make sure they are healing adequatley 8. Hypokalemia: will need to see if this has resolved 9. Hypocalcemia: low calcium levels throughout hospitalization, will need to be monitored and was started on supplementation prior to discharge. 10. Patient will need an outpatient stress test  Outstanding Results: none Discharge Instructions: Patient was counseled important signs and symptoms that should prompt return to medical care, changes in medications, dietary instructions, activity restrictions, and follow up appointments.  Follow-up Information    Follow up with SOOD,VINEET, MD. On 10/21/2012. (Appt at [facility])    Contact information:   [address] [place] [address] [phone]       Follow up with Everlene Other, DO. On 10/06/2012. (10:45 am)    Contact information:   [address] [place] [address] [phone]          Discharge Condition: improved  Marikay Alar, MD 10/02/2012, 2:02 PM

## 2012-09-26 NOTE — Progress Notes (Signed)
ANTICOAGULATION CONSULT NOTE - Follow Up Consult  Pharmacy Consult for heparin Indication: NSTEMI  Labs:  Basename 09/26/12 0518 09/25/12 1818 09/25/12 0535 09/24/12 2325 09/24/12 1732 09/24/12 1309  HGB 8.0* -- 9.0* -- -- --  HCT 27.6* -- 31.5* -- -- 30.2*  PLT PENDING -- 168 -- -- 172  APTT -- -- -- -- -- --  LABPROT -- -- -- -- -- --  INR -- -- -- -- -- --  HEPARINUNFRC 0.34 0.13* 0.30 -- -- --  CREATININE -- -- 2.46* -- -- 2.36*  CKTOTAL -- -- -- -- -- --  CKMB -- -- -- -- -- --  TROPONINI -- -- 0.97* 1.48* 2.45* --    Assessment: 67yo female now therapeutic on heparin after rate increase, previously had been therapeutic then dropped.  Goal of Therapy:  Heparin level 0.3-0.7 units/ml   Plan:  Will increase heparin gtt slightly to 1250 units/hr and confirm stable with additional level.  Colleen Can PharmD BCPS 09/26/2012,6:23 AM

## 2012-09-26 NOTE — Progress Notes (Signed)
And PULMONARY  / CRITICAL CARE MEDICINE  Name: Anita [name] MRN: [id number] DOB: 1946-07-01    ADMISSION DATE:  09/24/2012 CONSULTATION DATE:  1/29  REFERRING MD :  Dr. Clinton Sawyer - [facility]  CHIEF COMPLAINT:  Acute Respiratory Insufficiency  BRIEF PATIENT DESCRIPTION: 18 y/oF obese, heavy smoker (2ppd) admitted 1/29 with presumed newly diagnosed CHF exacerbation, and STEMI, acute hypercarbic respiratory failure, and acute renal failure after experiencing 3-68m worsening shortness of breath and weight gain.  Hypercarbic respiratory failure requiring BiPAP.  SIGNIFICANT EVENTS / STUDIES:  1/29 - Admit with CHF, Acute Renal Failure, NSTEMI, Hypercarbic Respiratory Failure. 1/30 - Renal US - neg for obstruction, atrophy 1/30 - 2D Echo - grade 2 diastolic dysfunction, mild RV dilation, EF 55%  LINES / TUBES: 1/29 Foley catheter  CULTURES: 1/29 Flu>>> negative 1/29 BCx2>>>ng 1/29 Urine culture >>>  ANTIBIOTICS: 1/29 Levaquin>>>  SUBJECTIVE/ INTERVAL HISTORY:   No chest pain, nausea, vomiting, cough, abdominal pain. She is tolerating Ventimask, however continues to desaturate to 87-88% Only used few hrs of bipap overnight.  VITAL SIGNS: Temp:  [97.6 F (36.4 C)-98.7 F (37.1 C)] 98 F (36.7 C) (01/31 0730) Pulse Rate:  [83-99] 88  (01/31 0700) Resp:  [16-26] 25  (01/31 0700) BP: (95-135)/(52-89) 103/62 mmHg (01/31 0700) SpO2:  [86 %-99 %] 88 % (01/31 0700) FiO2 (%):  [40 %-50 %] 50 % (01/31 0630) Weight:  [247 lb 5.7 oz (112.2 kg)] 247 lb 5.7 oz (112.2 kg) (01/31 0455) HEMODYNAMICS:   VENTILATOR SETTINGS: Vent Mode:  [-]  FiO2 (%):  [40 %-50 %] 50 %  INTAKE / OUTPUT:  Intake/Output Summary (Last 24 hours) at 09/26/12 0813 Last data filed at 09/26/12 0700  Gross per 24 hour  Intake 777.95 ml  Output   2570 ml  Net -1792.05 ml    PHYSICAL EXAMINATION: General:  Morbidly obese, chronically ill appearing Neuro:  Alert and oriented x3, moving all 4 extremities  spontaneous, no asterexis HEENT:  Mm pink/moist, thick/ short neck Cardiovascular:  s1s2 rrr, no m/r/g Lungs:   No increased work of breathing on Ventimask. bilaterally with wheezing / crackles Abdomen:  Obese/soft, bsx4 active, tingling. Abdomen distended. Musculoskeletal:  No acute deformities Skin:  Warm/dry, BLE erythema, small pinpoint ulcerations, changes c/w with chronic venous stasis   ASSESSMENT AND PLAN  PULMONARY  Lab 09/24/12 1541 09/24/12 1314  PHART 7.329* 7.202*  PCO2ART 46.9* 70.0*  PO2ART 89.0 46.0*  HCO3 24.8* 27.4*  TCO2 26 29   A:   1) Acute hypercarbic respiratory failure - likely multifactorial secondary to pulmonary HTN for OSA/OHS and pulmonary edema, COPD, morbid obesity, likely as well as deconditioning.  2) Suspected COPD exacerbation - significant wheezing on admission. Continue steroids, bronchodilators, and initial coverage for CAP. 3) Tobacco abuse - 2 packs per day P:   - Levaquin for CAP coverage  -FU CXR in am - Diureses as BP and renal function permits. - Duoneb QID. - PRN albuterol. - Steroids -rapid taper over 1-2 weeks - Nocturnal BiPAP 10/5 - sleep study as outpt - likely has OSA/OHS - Smoking cesation  CARDIOVASCULAR  Lab 09/25/12 0535 09/24/12 2325 09/24/12 1732  TROPONINI 0.97* 1.48* 2.45*    Lab 09/24/12 1309  PROBNP 17719.0*   A:  1) NSTEMI -  possibly contributed by acute exacerbation of chronic lung disease. However given EKG changes, possible acute cardiac cause. Acute kidney injury also likely contributed towards elevation. 2) Acute diastolic CHF exacerbation- grade 2 per 2D echo P:  -  Continue aspirin. - DC heparin drip per cardiology. - Diureses. - Cardiology considering need for catheterization versus noninvasive ischemia evaluation   RENAL  Lab 09/26/12 0518 09/25/12 0535  NA 137 145  K 4.0 5.3*  CL 100 105  CO2 25 24  BUN 67* 54*  CREATININE 2.68* 2.46*  GLUCOSE 153* 163*  MG -- 2.7*  PHOS -- 8.0*    A:   1) Acute renal failure - likely in the setting of CHF exacerbation. Renal ultrasound negative for hydronephrosis, obstruction, or atrophy -unclear cause ? Cardiorenal , baseline 1.0 in 2012. Now with mild elevation of serum creatinine. P:   - Primary team changing to torsemide today.  GASTROINTESTINAL  Lab 09/26/12 0518 09/25/12 0535 09/24/12 1309  HGB 8.0* 9.0* --  HCT 27.6* 31.5* --  AST -- -- 57*  ALT -- -- 39*  ALKPHOS -- -- 238*  BILITOT -- -- 0.6  ALBUMIN -- -- 2.9*   A:  1) Suspected congestive hepatopathy  - in setting of CHF exacerbation 2) Hepatic steatosis - may otherwise account for the elevated LFTs  P:   - Monitor. - Escalate diet  HEMATOLOGIC  Lab 09/26/12 0518 09/25/12 0535  WBC 9.3 9.9  HGB 8.0* 9.0*  HCT 27.6* 31.5*  MCV 86.8 87.5  PLT 147* 168  INR -- --   A: 1) Acute normocytic anemia - admission hemoglobin 8.9 . Prior BL hemoglobin 12-13. Cause unclear. FOBT negative. Anemia panel pending. Acutely worsening, no active bleeding. P:  - Monitor CBC. - Followup anemia panel.  INFECTIOUS  Lab 09/26/12 0518 09/25/12 0535 09/24/12 1357 09/24/12 1309  WBC 9.3 9.9 -- --  NEUTROABS -- -- -- 7.6  LATICACIDVEN -- -- 1.2 --  PROCALCITON -- -- -- --  A:  1) UTI - urine culture pending. 2) Possible community acquired pneumonia - suggested on chest x-ray, however less consistent with chronicity of patient's history. P:   - Levaquin, to cover for both UTI and possible COPD exacerbation, CAP. - Urine culture pending.  ENDOCRINE  Lab 09/25/12 2140 09/25/12 1604 09/25/12 1255  GLUCAP 159* 232* 182*   Lab Results  Component Value Date   HGBA1C 6.6* 09/25/2012     A: DM2, newly diagnosed P:   - ISS. - Monitor CBG's.  NEUROLOGIC A:  Lethargic with CO2 narcosis - resolved    Signed: Johnette Abraham, Roma Schanz, Internal Medicine Resident Pager: [phone]  OK to transfer to [facility].

## 2012-09-27 ENCOUNTER — Inpatient Hospital Stay (HOSPITAL_COMMUNITY): Payer: Medicare Other

## 2012-09-27 DIAGNOSIS — J449 Chronic obstructive pulmonary disease, unspecified: Secondary | ICD-10-CM | POA: Diagnosis present

## 2012-09-27 DIAGNOSIS — F17200 Nicotine dependence, unspecified, uncomplicated: Secondary | ICD-10-CM

## 2012-09-27 DIAGNOSIS — I5033 Acute on chronic diastolic (congestive) heart failure: Secondary | ICD-10-CM

## 2012-09-27 LAB — CBC
Hemoglobin: 7.8 g/dL — ABNORMAL LOW (ref 12.0–15.0)
MCHC: 28.8 g/dL — ABNORMAL LOW (ref 30.0–36.0)
RBC: 3.08 MIL/uL — ABNORMAL LOW (ref 3.87–5.11)
WBC: 10.2 10*3/uL (ref 4.0–10.5)

## 2012-09-27 LAB — HEPARIN LEVEL (UNFRACTIONATED): Heparin Unfractionated: 0.1 IU/mL — ABNORMAL LOW (ref 0.30–0.70)

## 2012-09-27 LAB — GLUCOSE, CAPILLARY
Glucose-Capillary: 147 mg/dL — ABNORMAL HIGH (ref 70–99)
Glucose-Capillary: 288 mg/dL — ABNORMAL HIGH (ref 70–99)
Glucose-Capillary: 244 mg/dL — ABNORMAL HIGH (ref 70–99)

## 2012-09-27 LAB — BASIC METABOLIC PANEL
CO2: 30 mEq/L (ref 19–32)
Calcium: 7.1 mg/dL — ABNORMAL LOW (ref 8.4–10.5)
Sodium: 143 mEq/L (ref 135–145)

## 2012-09-27 MED ORDER — PREDNISONE 20 MG PO TABS
40.0000 mg | ORAL_TABLET | Freq: Every day | ORAL | Status: DC
  Administered 2012-09-28 – 2012-09-30 (×3): 40 mg via ORAL
  Filled 2012-09-27 (×4): qty 2

## 2012-09-27 MED ORDER — ATORVASTATIN CALCIUM 40 MG PO TABS
40.0000 mg | ORAL_TABLET | Freq: Every day | ORAL | Status: DC
  Administered 2012-09-27 – 2012-10-01 (×5): 40 mg via ORAL
  Filled 2012-09-27 (×7): qty 1

## 2012-09-27 MED ORDER — DOPAMINE-DEXTROSE 3.2-5 MG/ML-% IV SOLN
2.0000 ug/kg/min | INTRAVENOUS | Status: DC
  Administered 2012-09-27: 2 ug/kg/min via INTRAVENOUS
  Filled 2012-09-27: qty 250

## 2012-09-27 NOTE — Progress Notes (Signed)
Patient refused blood, MD notified and blood refusal signed and in chart.

## 2012-09-27 NOTE — Progress Notes (Signed)
Family Medicine Teaching Service Daily Progress Note Service Page: [phone]  Subjective:  Requiring some Bipap and Vm overnight, now on NRB at 15 L O2.  Net negative 1600 cc UOP yesterday.  Patient states she feels slightly better with dyspnea. Thinks leg swelling also slightly improved.   Objective: Temp:  [98 F (36.7 C)-98.3 F (36.8 C)] 98.3 F (36.8 C) (02/01 0337) Pulse Rate:  [82-112] 96  (02/01 0400) Resp:  [14-29] 23  (02/01 0400) BP: (84-124)/(48-87) 107/48 mmHg (02/01 0400) SpO2:  [81 %-99 %] 92 % (02/01 0400) FiO2 (%):  [50 %-100 %] 100 % (02/01 0400) Filed Weights   09/24/12 2000 09/25/12 0500 09/26/12 0455  Weight: 252 lb 13.9 oz (114.7 kg) 247 lb 9.2 oz (112.3 kg) 247 lb 5.7 oz (112.2 kg)    Intake/Output Summary (Last 24 hours) at 09/27/12 0539 Last data filed at 09/27/12 0400  Gross per 24 hour  Intake 1038.25 ml  Output   2775 ml  Net -1736.75 ml   Physical Exam: General: chronically ill appearing elderly lady, head elevated in bedside recliner chair. NAD. Cardiovascular: RRR. No appreciable murmurs, rubs, or gallops. Respiratory: Coarse breath sounds and expiratory wheezing throughout. Very diminished BS in B bases R>L. Breast: Large firm lateral breast mass - approximately 9 x 7 cm.  No skin changes appreciated. Abdomen: obese, nontender, protuberant.  Extremities: 2+ lower extremity edema with anterior and posterior small ulcerations with nonpurulent drainage. Neuro: AO x 3. No focal deficits Skin: R lower abdomen - ~ 2 cm circular area of erythema with 3-4 mm papule with punctate opening in the center.    Labs:  CBC BMET   Lab 09/27/12 0545 09/26/12 0518 09/25/12 0535  WBC 10.2 9.3 9.9  HGB 7.8* 8.0* 9.0*  HCT 27.1* 27.6* 31.5*  PLT 148* 147* 168    Lab 09/27/12 0545 09/26/12 0518 09/25/12 0535  NA 143 137 145  K 4.1 4.0 5.3*  CL 99 100 105  CO2 30 25 24   BUN 80* 67* 54*  CREATININE 2.74* 2.68* 2.46*  GLUCOSE 189* 153* 163*  CALCIUM  7.1* 7.4* 8.4     Cardiac Panel (last 3 results)  Basename 09/25/12 0535 09/24/12 2325 09/24/12 1732  CKTOTAL -- -- --  CKMB -- -- --  TROPONINI 0.97* 1.48* 2.45*  RELINDX -- -- --   Urinalysis  Imaging:  2DEcho: Study Conclusions - Left ventricle: The cavity size was normal. Wall thicknesswas normal. The estimated ejection fraction was 55%. Although no diagnostic regional wall motion abnormality was identified, this possibility cannot be completely excluded on the basis of this study. Features are consistent with a pseudonormal left ventricular filling pattern, with concomitant abnormal relaxation and increased filling pressure (grade 2 diastolic dysfunction). - Ventricular septum: D-shaped interventricular septum suggests RV pressure and volume overload. Impressions: - Technically difficult study with poor acoustic windows. Definity contrast was used. Normal LV size with probably normal overall systolic function, EF 55% . No definite wall motion abnormalities were seen when Definity was used. Moderate diastolic dysfunction. The RV was never well-visualized . There was leftwards shift of the interventricular septum suggesting RV pressure and volume overload. The RV appeared mildly dilated with normal systolic function.  Dg Chest Portable 1 View 09/24/2012  IMPRESSION:  1. Cardiomegaly and central venous congestion. 2.  Left retrocardiac opacity likely representing a hiatal hernia or ectatic aorta.  Renal Ultrasound IMPRESSION:  No evidence of renal obstruction or significant renal atrophy.  Shorter measured left kidney may be  somewhat foreshortened due to  poor visualization. Incidental note is made of probable  hepatomegaly with increased echogenicity suggestive of steatosis.  Portable Chest 1 View 09/25/2012  IMPRESSION:  1.  Increased bibasilar airspace disease likely due to atelectasis and small effusions. 2.  Cardiomegaly.   Micro Urine Cx - pending culture reincubated for  better growth Blood Cx - Negative to Date  Assessment/Plan: Anita [name] is a 67 y.o. year old female with little PMH but a long standing history of tobacco abuse who presents with acute SOB. In the ED, patient found to be in acute respiratory failure and was placed on BiPAP.  Further work up revealed acute kidney injury (creatinine of 2.36), NSTEMI (elevated POC Troponin of 2.26), and Acute [place]F [place]BNP of 16[address].   Respiratory  # Acute Respiratory failure secondary to acute COPD exacerbation and acute CHF exacerbation - Patient now out of ICU and in stepdown bed.  CHF exacerbation  - Cardiology following and we greatly appreciate their help in managing this patient.  - Patient diuresed negative 4L since admission  - continue PO Torsemide 80 mg BID pending renal function.    - Patient now off BiPAP.  Currently on NRB 50% FIO2 15 L.   COPD Exacerbation   - Duonebs Q4/Q2 PRN.  - Prednisone 60 mg daily (Day 3)  - Switched to PO Levaquin (Day 4 of abx)  Cardiac  # NSTEMI - Elevated troponin of 2.26, EKG revealed no ST or T wave changes  - Troponin trending down 2.45 --> 1.48 --> 0.97 - Cardiology following and we greatly appreciate their help in managing this patient - cath vs noninvasive study - Will continue daily Aspirin 81 mg.  Off heparin gtt.  Renal  # Acute Kidney Injury - Likely secondary to decreased perfusion in the setting of CHF  - Creatinine rising: 2.36 --> 2.46 --> 2.68-->2.74 - Patient still volume overloaded.  Will continue diuresis with Toresemide (see above -daily BMET  ID  # ? PNA - Chest xray revealed retrocardiac opacity that could represent focal infiltrate. Patient afebrile without leukocytosis  - UA obtained and remarkable for Large Leukocytes, too numerous to count WBC's - Patient currently on Levaquin (Day 3) which will cover both CAP and UTI  Heme/Onc  # Anemia - Patient noted to be anemic with Hb of 8.9 (priors 11.9-12.9). Normocytic with  MCV of 87.  - Anemia down at 7.8 this am, nearing transfusion threshold-careful with volume status.  Likely secondary to loss from bleeding associated with IV attempts (noted to have bleeding requiring pressure for >30 mins) - FOBT negative. - Iron/TIBC/Ferritin/Reticulocytes today   # Breast mass - Large, suspicious for malignancy - Patient will need outpatient mammogram  Endo Diabetes mellitus, type 2 - New diagnosis - A1C = 6.6 - Will monitor CBG's during admission.  SSI TID AC and QHS. Carb mod diet. - Patient will need outpatient follow up-trial of lifestyle modifications vs therapy (no metformin with renal insufficiency).  Skin # Lower extremity ulcers - Wound care consulted for lower extremity ulcers.  Silicone foam dressings in place.  # Abscess - R lower abdomen - wound culture-few GPC pairs on stain, no growth to date. - Mupirocin BID  FEN/GI: Carb modified. Prophylaxis: Heparin SQ. Disposition: Pending clinical improvement. Code Status: Full Code  Nakya Weyand, MD 09/27/2012, 5:39 AM

## 2012-09-27 NOTE — Progress Notes (Signed)
FMTS Attending Daily Note:  Anita Don MD  [phone] pager  Family Practice pager:  [phone] I have seen and examined this patient and have reviewed their chart. I have discussed this patient with the resident. I agree with the resident's findings, assessment and care plan.  Additionally: - Pt sitting in chair beside bed, on NRB mask.  Comfortable appearing.  Lungs still with poor aeration.   1.  Acute Respiratory Failure:  Greatly appreciate cardiology and pulmonology input.  Agree that she has likely been hypoxemic for some time as her baseline.  Continue diuresis, antibiotics, steroids as per resident note.  Bipap at night.  Agree needs sleep study, PAP at home.  2.  Acute on chronic CHF:  Agree with dopamine to help with perfusion and for continued diuresis.   3.  AKI:  Creatinine trending up, but rate of increase has slowed.   4.  Anemia, normocytic:  Hgb continuing to drop, approaching subjective transfusion threshold of 7.  Had acute bleeding from IV site with heparin.  Baseline from 2012 appears to be 11 - 12.  May benefit overall from blood transfusion as she is symptomatic.  Concern of course would be worsening fluid overload.  Will defer to cardiology if deems this could improve dyspnea.

## 2012-09-27 NOTE — Progress Notes (Signed)
Patient ID: Anita [name], female   DOB: 08-02-1946, 67 y.o.   MRN: [id number]    SUBJECTIVE: Patient continues to diurese reasonably well but renal function is worsening.  She still has a significant oxygen requirement.       [name] albuterol  5 mg Nebulization Q4H  . antiseptic oral rinse  15 mL Mouth Rinse q12n4p  . aspirin EC  81 mg Oral Daily  . chlorhexidine  15 mL Mouth Rinse BID  . Chlorhexidine Gluconate Cloth  6 each Topical Q0600  . heparin subcutaneous  5,000 Units Subcutaneous Q8H  . insulin aspart  0-5 Units Subcutaneous QHS  . insulin aspart  0-9 Units Subcutaneous TID WC  . ipratropium  0.5 mg Nebulization Q4H  . levofloxacin  250 mg Oral Daily  . mupirocin ointment  1 application Nasal BID  . mupirocin ointment   Topical BID  . predniSONE  60 mg Oral Q breakfast  . sodium chloride  3 mL Intravenous Q12H  . torsemide  80 mg Oral BID      Filed Vitals:   09/27/12 0600 09/27/12 0700 09/27/12 0747 09/27/12 0800  BP: 109/52 119/53  117/54  Pulse: 89 87  84  Temp:   98 F (36.7 C)   TempSrc:   Oral   Resp: 13 18  16   Height:      Weight:      SpO2: 100% 95%  97%    Intake/Output Summary (Last 24 hours) at 09/27/12 0851 Last data filed at 09/27/12 0800  Gross per 24 hour  Intake    852 ml  Output   3190 ml  Net  -2338 ml    LABS: Basic Metabolic Panel:  Basename 09/27/12 0545 09/26/12 0518 09/25/12 0535  NA 143 137 --  K 4.1 4.0 --  CL 99 100 --  CO2 30 25 --  GLUCOSE 189* 153* --  BUN 80* 67* --  CREATININE 2.74* 2.68* --  CALCIUM 7.1* 7.4* --  MG -- -- 2.7*  PHOS -- -- 8.0*   Liver Function Tests:  Basename 09/24/12 1309  AST 57*  ALT 39*  ALKPHOS 238*  BILITOT 0.6  PROT 7.1  ALBUMIN 2.9*   No results found for this basename: LIPASE:2,AMYLASE:2 in the last 72 hours CBC:  Basename 09/27/12 0545 09/26/12 0518 09/24/12 1309  WBC 10.2 9.3 --  NEUTROABS -- -- 7.6  HGB 7.8* 8.0* --  HCT 27.1* 27.6* --  MCV 88.0 86.8 --  PLT 148* 147* --    Cardiac Enzymes:  Basename 09/25/12 0535 09/24/12 2325 09/24/12 1732  CKTOTAL -- -- --  CKMB -- -- --  CKMBINDEX -- -- --  TROPONINI 0.97* 1.48* 2.45*   BNP: No components found with this basename: POCBNP:3 D-Dimer: No results found for this basename: DDIMER:2 in the last 72 hours Hemoglobin A1C:  Basename 09/25/12 0535  HGBA1C 6.6*   Fasting Lipid Panel: No results found for this basename: CHOL,HDL,LDLCALC,TRIG,CHOLHDL,LDLDIRECT in the last 72 hours Thyroid Function Tests:  Basename 09/25/12 0535  TSH 0.162*  T4TOTAL --  T3FREE --  THYROIDAB --   Anemia Panel:  Basename 09/26/12 0518  VITAMINB12 --  FOLATE --  FERRITIN 963*  TIBC 293  IRON 124  RETICCTPCT 3.7*    RADIOLOGY:  Dg Chest Port 1 View  09/27/2012  *RADIOLOGY REPORT*  Clinical Data: Respiratory failure  PORTABLE CHEST - 1 VIEW  Comparison: Prior chest x-ray 09/25/2012  Findings: No significant interval change in the appearance of  the lungs.  Persistent low inspiratory volumes with bibasilar effusions and associated bibasilar opacities.  Stable cardiomegaly.  Mild pulmonary vascular congestion without overt edema.  IMPRESSION: No significant change in the appearance of the chest with persistent low inspiratory volumes, bilateral pleural effusions and bibasilar opacities reflecting pleural fluid combined with atelectasis versus consolidation.   Original Report Authenticated By: Malachy Moan, M.D.     PHYSICAL EXAM General: NAD Neck: JVP 12 cm, no thyromegaly or thyroid nodule.  Lungs: Crackles 1/2 up lung fields bilaterally, coarse breath sounds throughout.  CV: Nondisplaced PMI.  Heart regular S1/S2, no S3/S4, no murmur.  1+ edema to knees bilaterally.  Abdomen: Soft, nontender, no hepatosplenomegaly, no distention.  Neurologic: Alert and oriented x 3.  Psych: Normal affect. Extremities: No clubbing or cyanosis.   TELEMETRY: Reviewed telemetry pt in NSR  ASSESSMENT AND PLAN:  67 yo with  possible OHS/OSA and COPD admitted with acute diastolic CHF and probable COPD exacerbation.  1. CHF: Acute on chronic diastolic CHF.  Echo was difficult but there does appear to be RV failure.  Unable to estimate PA pressure from echo but given evidence for RV pressure/volume overload, suspect there is pulmonary HTN.  She is diuresing but is still volume overloaded with considerable oxygen requirement.  Creatinine and BUN are rising.  - Would start dopamine @ 2 mcg/kg/min to support RV and potentially preserve renal fxn.   - Continue torsemide as ordered.  2. RV failure: Difficult echo but evidence for RV failure.  Concern for PAH in the setting of possible OHS/OSA.  RHC may be helpful here.  3. Elevated troponin: Has had CP in the past at home.  Not, however, a primary symptom for this admission which was driven by CHF and COPD.  Will not do LHC at this point with renal failure, but when stable would at least consider functional study.  Continue ASA 81, would also given her statin.  4. COPD: Treating for COPD exacerbation with steroids, levofloxacin.  5. Acute on CKD:  Creatinine rising and considerably higher than her prior baseline.  As above, will add dopamine to support her RV and possible improve renal flow.   Marca Ancona 09/27/2012 9:00 AM

## 2012-09-27 NOTE — Progress Notes (Signed)
And PULMONARY  / CRITICAL CARE MEDICINE  Name: Anita [name] MRN: [id number] DOB: 1946/05/26    ADMISSION DATE:  09/24/2012 CONSULTATION DATE:  1/29  REFERRING MD :  Dr. Clinton Sawyer - [facility]  CHIEF COMPLAINT:  Acute Respiratory Insufficiency  BRIEF PATIENT DESCRIPTION: 75 y/oF obese, heavy smoker (2ppd) admitted 1/29 with presumed newly diagnosed CHF exacerbation, and STEMI, acute hypercarbic respiratory failure, and acute renal failure after experiencing 3-53m worsening shortness of breath and weight gain.  Hypercarbic respiratory failure requiring BiPAP.  SIGNIFICANT EVENTS / STUDIES:  1/29 - Admit with CHF, Acute Renal Failure, NSTEMI, Hypercarbic Respiratory Failure. 1/30 - Renal US - neg for obstruction, atrophy 1/30 - 2D Echo - grade 2 diastolic dysfunction, mild RV dilation, EF 55%  LINES / TUBES: 1/29 Foley catheter  CULTURES: 1/29 Flu>>> negative 1/29 BCx2>>>NGTD >>  1/29 Urine culture >>> 1/30 RLQ abscess >> GPC, GNR >>   ANTIBIOTICS: 1/29 Levaquin>>>  SUBJECTIVE/ INTERVAL HISTORY:   Tolerated BiPAP for part of the night Currently on high-flow mask Good diuresis last 24h 2.3L  VITAL SIGNS: Temp:  [98 F (36.7 C)-98.3 F (36.8 C)] 98 F (36.7 C) (02/01 0747) Pulse Rate:  [82-112] 89  (02/01 0909) Resp:  [13-29] 18  (02/01 0909) BP: (84-124)/(48-87) 117/54 mmHg (02/01 0800) SpO2:  [81 %-100 %] 98 % (02/01 0909) FiO2 (%):  [50 %-100 %] 100 % (02/01 0909) Weight:  [109.7 kg (241 lb 13.5 oz)] 109.7 kg (241 lb 13.5 oz) (02/01 0500) HEMODYNAMICS:   VENTILATOR SETTINGS: Vent Mode:  [-]  FiO2 (%):  [50 %-100 %] 100 %  INTAKE / OUTPUT:  Intake/Output Summary (Last 24 hours) at 09/27/12 1324 Last data filed at 09/27/12 0800  Gross per 24 hour  Intake    830 ml  Output   3190 ml  Net  -2360 ml    PHYSICAL EXAMINATION: General:  Morbidly obese, chronically ill appearing Neuro:  Alert and oriented x3, moving all 4 extremities spontaneous, no asterexis HEENT:   Mm pink/moist, thick/ short neck Cardiovascular:  s1s2 rrr, no m/r/g Lungs:   No increased work of breathing on Ventimask. bilaterally with wheezing / crackles Abdomen:  Obese/soft, bsx4 active, tingling. Abdomen distended. Musculoskeletal:  No acute deformities Skin:  Warm/dry, BLE erythema, small pinpoint ulcerations, changes c/w with chronic venous stasis   ASSESSMENT AND PLAN  PULMONARY  Lab 09/24/12 1541 09/24/12 1314  PHART 7.329* 7.202*  PCO2ART 46.9* 70.0*  PO2ART 89.0 46.0*  HCO3 24.8* 27.4*  TCO2 26 29   A:   1) Acute hypercarbic respiratory failure - likely multifactorial secondary to pulmonary HTN for OSA/OHS + pulmonary edema, COPD, morbid obesity, deconditioning. She has almost certainly been hypoxemic at baseline for months prior to admission.  2) Suspected COPD exacerbation - significant wheezing on admission. Continue steroids, bronchodilators, and initial coverage for CAP. 3) Tobacco abuse - 2 packs per day 4) secondary PAH with R heart failure and anasarca P:   - Levaquin for CAP coverage day 4of 8 - Diureses as BP and renal function permits > note dopa started 2/1 to support BP for more volume removal - Duoneb QID. - PRN albuterol. - Steroids -rapid taper over 1-2 weeks - Mandatory Nocturnal BiPAP > increase to 15/10 on 2/1 - sleep study as outpt - likely has OSA/OHS - Smoking cesation  CARDIOVASCULAR  Lab 09/25/12 0535 09/24/12 2325 09/24/12 1732  TROPONINI 0.97* 1.48* 2.45*    Lab 09/24/12 1309  PROBNP 17719.0*   A:  1)  NSTEMI -  possibly contributed by acute exacerbation of chronic lung disease, chronic hypoxemia.  2) Acute diastolic CHF exacerbation- grade 2 per 2D echo 3) severe secondary PAH (diastolic dysfxn + OSA/OHS + COPD) P:  - Continue aspirin. - DC heparin drip per cardiology. - Diuresis. Dopa added 2/1 - Cardiology considering need for catheterization versus noninvasive ischemia evaluation  - additional PAH w/u can be done once  acute event resolves (eval for auto-immune dz, chronic VTE, etc)  RENAL  Lab 09/27/12 0545 09/26/12 0518 09/25/12 0535  NA 143 137 --  K 4.1 4.0 --  CL 99 100 --  CO2 30 25 --  BUN 80* 67* --  CREATININE 2.74* 2.68* --  GLUCOSE 189* 153* --  MG -- -- 2.7*  PHOS -- -- 8.0*   A:   1) Acute renal failure - Renal ultrasound negative for hydronephrosis, obstruction, or atrophy -unclear cause. Cardiorenal syndrome P:   - torsemide, continue slow diuresis - low dose dopa as above, pt preload dependent  GASTROINTESTINAL  Lab 09/27/12 0545 09/26/12 0518 09/24/12 1309  HGB 7.8* 8.0* --  HCT 27.1* 27.6* --  AST -- -- 57*  ALT -- -- 39*  ALKPHOS -- -- 238*  BILITOT -- -- 0.6  ALBUMIN -- -- 2.9*   A:  1) Suspected congestive hepatopathy  - in setting of CHF exacerbation, but also chronic PAH 2) Hepatic steatosis - may otherwise account for the elevated LFTs  P:   - Monitor.  HEMATOLOGIC  Lab 09/27/12 0545 09/26/12 0518  WBC 10.2 9.3  HGB 7.8* 8.0*  HCT 27.1* 27.6*  MCV 88.0 86.8  PLT 148* 147*  INR -- --   A: 1) Acute normocytic anemia - admission hemoglobin 8.9 . Prior BL hemoglobin 12-13. Cause unclear but likely total body volume overload. FOBT negative. Anemia panel > normal Fe studies, ferritin elevated 963 P:  - Monitor CBC.  INFECTIOUS  Lab 09/27/12 0545 09/26/12 0518 09/24/12 1357 09/24/12 1309  WBC 10.2 9.3 -- --  NEUTROABS -- -- -- 7.6  LATICACIDVEN -- -- 1.2 --  PROCALCITON -- -- -- --  A:  1) UTI - urine culture pending. 2) Possible community acquired pneumonia - suggested on chest x-ray, however less consistent with chronicity of patient's history. P:   - Levaquin, to cover for both UTI and possible COPD exacerbation, CAP. - Urine culture pending.  ENDOCRINE  Lab 09/27/12 0749 09/26/12 2157 09/26/12 1746 09/26/12 1129 09/26/12 0731  GLUCAP 164* 226* 244* 156* 132*   Lab Results  Component Value Date   HGBA1C 6.6* 09/25/2012     A: DM2, newly  diagnosed P:   - ISS. - Monitor CBG's.  NEUROLOGIC A:  Lethargic with CO2 narcosis - resolved   Levy Pupa, MD, PhD 09/27/2012, 9:51 AM [facility] [phone] or if no answer [phone]

## 2012-09-28 DIAGNOSIS — E662 Morbid (severe) obesity with alveolar hypoventilation: Secondary | ICD-10-CM

## 2012-09-28 DIAGNOSIS — I2789 Other specified pulmonary heart diseases: Secondary | ICD-10-CM

## 2012-09-28 LAB — BASIC METABOLIC PANEL
CO2: 35 mEq/L — ABNORMAL HIGH (ref 19–32)
Calcium: 7.1 mg/dL — ABNORMAL LOW (ref 8.4–10.5)
Chloride: 95 mEq/L — ABNORMAL LOW (ref 96–112)
Chloride: 96 mEq/L (ref 96–112)
Creatinine, Ser: 2.52 mg/dL — ABNORMAL HIGH (ref 0.50–1.10)
GFR calc Af Amer: 22 mL/min — ABNORMAL LOW (ref >90)
Potassium: 3.4 mEq/L — ABNORMAL LOW (ref 3.5–5.1)
Potassium: 3.1 mEq/L — ABNORMAL LOW (ref 3.5–5.1)
Sodium: 144 mEq/L (ref 135–145)
Sodium: 145 mEq/L (ref 135–145)

## 2012-09-28 LAB — CBC
Platelets: 146 10*3/uL — ABNORMAL LOW (ref 150–400)
RBC: 3.21 MIL/uL — ABNORMAL LOW (ref 3.87–5.11)
WBC: 12.3 10*3/uL — ABNORMAL HIGH (ref 4.0–10.5)

## 2012-09-28 LAB — GLUCOSE, CAPILLARY
Glucose-Capillary: 155 mg/dL — ABNORMAL HIGH (ref 70–99)
Glucose-Capillary: 246 mg/dL — ABNORMAL HIGH (ref 70–99)

## 2012-09-28 MED ORDER — POTASSIUM CHLORIDE CRYS ER 20 MEQ PO TBCR
40.0000 meq | EXTENDED_RELEASE_TABLET | Freq: Once | ORAL | Status: AC
  Administered 2012-09-28: 40 meq via ORAL
  Filled 2012-09-28: qty 2

## 2012-09-28 MED ORDER — POTASSIUM CHLORIDE 20 MEQ PO PACK: 40.0000 meq | PACK | Freq: Once | ORAL | Status: DC

## 2012-09-28 NOTE — Progress Notes (Signed)
FMTS Attending Daily Note:  Renold Don MD  [phone] pager  Family Practice pager:  [phone] I have seen and examined this patient and have reviewed their chart. I have discussed this patient with the resident. I agree with the resident's findings, assessment and care plan.  Additionally: - Gradually improving.  She is on Venti mask and keeping her O2 sats >90% currently.   - Hgb basically stable - Creatinine trending down slightly, basically stable.   - Gradually weaning O2.  Agree with evening Bipap - Agree with addition of statin.   - Cont treatment of COPD with antibiotics and prednisone - Cont acute on chronic heart failure treatment.  Still on dopamine.  Diuresis has improved. - Greatly appreciate cardiology and pulmonary input.

## 2012-09-28 NOTE — Progress Notes (Signed)
Family Medicine Teaching Service Daily Progress Note Service Page: [phone]  Subjective:  Patient states she is feeling better, ready to go home. Wants to get out of bed.   She asks if I am GYN to see her about the breast mass, I explained that I was Family medicine, but GYN was not the one to see her about the breast mass.  Told her she would need mammogram /MRI of breast after going home from hospital.  Then may need to see surgery or oncology.  She says she does not want surgery. Understands it could be cancer.    Objective: Temp:  [97.4 F (36.3 C)-98.3 F (36.8 C)] 97.4 F (36.3 C) (02/01 2000) Pulse Rate:  [86-112] 86  (02/02 0700) Resp:  [15-23] 15  (02/02 0700) BP: (100-137)/(49-94) 118/58 mmHg (02/02 0700) SpO2:  [84 %-98 %] 94 % (02/02 0807) FiO2 (%):  [50 %-100 %] 60 % (02/02 0700) Filed Weights   09/25/12 0500 09/26/12 0455 09/27/12 0500  Weight: 247 lb 9.2 oz (112.3 kg) 247 lb 5.7 oz (112.2 kg) 241 lb 13.5 oz (109.7 kg)    Intake/Output Summary (Last 24 hours) at 09/28/12 4132 Last data filed at 09/28/12 0700  Gross per 24 hour  Intake 1128.49 ml  Output   7650 ml  Net -6521.51 ml   Physical Exam: General: chronically ill appearing elderly lady, in chair position in bed, non-re breather on. Talking in full sentences.  Cardiovascular: RRR. No appreciable murmurs, rubs, or gallops. Respiratory: Coarse breath sounds and expiratory wheezing throughout. Very diminished BS in B bases R>L. Breast: Large firm lateral breast mass - approximately 9 x 7 cm.  No skin changes appreciated. Abdomen: obese, nontender, protuberant.  Extremities: 1+ lower extremity edema with one occlussive dressing on right lower leg, several on left lower leg.  Neuro: AO x 3. No focal deficits  Labs:  CBC BMET   Lab 09/28/12 0630 09/27/12 0545 09/26/12 0518  WBC 12.3* 10.2 9.3  HGB 8.1* 7.8* 8.0*  HCT 27.5* 27.1* 27.6*  PLT 146* 148* 147*    Lab 09/28/12 0630 09/27/12 0545 09/26/12 0518   NA 144 143 137  K 3.4* 4.1 4.0  CL 95* 99 100  CO2 35* 30 25  BUN 90* 80* 67*  CREATININE 2.57* 2.74* 2.68*  GLUCOSE 167* 189* 153*  CALCIUM 7.1* 7.1* 7.4*     Cardiac Panel (last 3 results)  Basename 09/27/12 0959  CKTOTAL --  CKMB --  TROPONINI 0.63*  RELINDX --   Urinalysis  Imaging:  2DEcho: Study Conclusions - Left ventricle: The cavity size was normal. Wall thicknesswas normal. The estimated ejection fraction was 55%. Although no diagnostic regional wall motion abnormality was identified, this possibility cannot be completely excluded on the basis of this study. Features are consistent with a pseudonormal left ventricular filling pattern, with concomitant abnormal relaxation and increased filling pressure (grade 2 diastolic dysfunction). - Ventricular septum: D-shaped interventricular septum suggests RV pressure and volume overload. Impressions: - Technically difficult study with poor acoustic windows. Definity contrast was used. Normal LV size with probably normal overall systolic function, EF 55% . No definite wall motion abnormalities were seen when Definity was used. Moderate diastolic dysfunction. The RV was never well-visualized . There was leftwards shift of the interventricular septum suggesting RV pressure and volume overload. The RV appeared mildly dilated with normal systolic function.  Dg Chest Portable 1 View 09/24/2012  IMPRESSION:  1. Cardiomegaly and central venous congestion. 2.  Left retrocardiac opacity  likely representing a hiatal hernia or ectatic aorta.  Renal Ultrasound IMPRESSION:  No evidence of renal obstruction or significant renal atrophy.  Shorter measured left kidney may be somewhat foreshortened due to  poor visualization. Incidental note is made of probable  hepatomegaly with increased echogenicity suggestive of steatosis.  Portable Chest 1 View 09/25/2012  IMPRESSION:  1.  Increased bibasilar airspace disease likely due to atelectasis  and small effusions. 2.  Cardiomegaly.   Micro Urine Cx - pending culture reincubated for better growth Blood Cx - Negative to Date  Assessment/Plan: Anita [name] is a 67 y.o. year old female with little PMH but a long standing history of tobacco abuse who presents with acute SOB. In the ED, patient found to be in acute respiratory failure and was placed on BiPAP.  Further work up revealed acute kidney injury (creatinine of 2.36), NSTEMI (elevated POC Troponin of 2.26), and Acute CHF with [place] proBNP of 81191.   Respiratory  # Acute Respiratory failure secondary to acute COPD exacerbation and acute CHF exacerbation - Patient now out of ICU and in stepdown bed.  CHF exacerbation  - Cardiology following and we greatly appreciate their help in managing this patient.  - Patient diuresed negative 10L since admission, but still with some evidence of fluid overload.   - continue PO Torsemide 80 mg BID renal function stable.    - Patient now off BiPAP qhs.  Currently on face mask, will wean oxygen as tolerated.   COPD Exacerbation   - Duonebs Q4/Q2 PRN.  - Prednisone 60 mg daily (Day 3)  - Switched to PO Levaquin (Day 4 of abx)  Cardiac  # NSTEMI - Elevated troponin of 2.26, EKG revealed no ST or T wave changes  - Troponin trended down 2.45 --> 1.48 --> 0.97 - Cardiology following and we greatly appreciate their help in managing this patient - cath vs noninvasive study - Will continue daily Aspirin 81 mg.  Off heparin gtt.  Renal  # Acute Kidney Injury - Likely secondary to decreased perfusion in the setting of CHF  - Creatinine stablizing: 2.36 --> 2.46 --> 2.68-->2.74-->2.57 - continue diuresis - -daily BMET  ID  # ? PNA - Chest xray revealed retrocardiac opacity that could represent focal infiltrate. Patient afebrile without leukocytosis  - UA obtained and remarkable for Large Leukocytes, too numerous to count WBC's - Patient currently on Levaquin (Day 3) which will cover both  CAP and UTI  Heme/Onc  # Anemia - Patient noted to be anemic with Hb of 8.9 (priors 11.9-12.9). Normocytic with MCV of 87.  - Pt refused blood transfusion yesterday, stable today at 8.1 - FOBT negative.  # Breast mass - Large, suspicious for malignancy - Patient will need outpatient mammogram  Endo Diabetes mellitus, type 2 - New diagnosis - A1C = 6.6 - Will monitor CBG's during admission.  SSI TID AC and QHS. Carb mod diet. - Patient will need outpatient follow up-trial of lifestyle modifications vs therapy (no metformin with renal insufficiency).  Skin # Lower extremity ulcers - Wound care consulted for lower extremity ulcers.  Silicone foam dressings in place.  # Abscess - R lower abdomen - wound culture-few GPC pairs on stain, no growth to date. - Mupirocin BID  FEN/GI: Carb modified. Prophylaxis: Heparin SQ. Disposition: Pending clinical improvement. Code Status: Full Code  Thai Hemrick, MD 09/28/2012, 8:26 AM

## 2012-09-28 NOTE — Progress Notes (Addendum)
Patient ID: Anita [name], female   DOB: [birth year], 67 y.o.   MRN: [id number]     SUBJECTIVE: Vigorous diuresis yesterday on low-dose dopamine.  Weight is down about 6 lbs.  Creatinine lower but BUN higher.  Still on NRBM today but suspect we can wean down.       . albuterol  5 mg Nebulization Q4H  . antiseptic oral rinse  15 mL Mouth Rinse q12n4p  . aspirin EC  81 mg Oral Daily  . atorvastatin  40 mg Oral q1800  . chlorhexidine  15 mL Mouth Rinse BID  . Chlorhexidine Gluconate Cloth  6 each Topical Q0600  . heparin subcutaneous  5,000 Units Subcutaneous Q8H  . insulin aspart  0-5 Units Subcutaneous QHS  . insulin aspart  0-9 Units Subcutaneous TID WC  . ipratropium  0.5 mg Nebulization Q4H  . levofloxacin  250 mg Oral Daily  . mupirocin ointment  1 application Nasal BID  . mupirocin ointment   Topical BID  . potassium chloride  40 mEq Oral Once  . predniSONE  40 mg Oral Q breakfast  . sodium chloride  3 mL Intravenous Q12H  . torsemide  80 mg Oral BID  dopamine gtt @ 2    Filed Vitals:   09/28/12 0314 09/28/12 0500 09/28/12 0700 09/28/12 0807  BP: 127/63 109/94 118/58   Pulse: 97 96 86   Temp:      TempSrc:      Resp: 19 18 15    Height:      Weight:      SpO2: 92% 94% 95% 94%    Intake/Output Summary (Last 24 hours) at 09/28/12 0817 Last data filed at 09/28/12 0700  Gross per 24 hour  Intake 1128.49 ml  Output   7650 ml  Net -6521.51 ml    LABS: Basic Metabolic Panel:  Basename 09/28/12 0630 09/27/12 0545  NA 144 143  K 3.4* 4.1  CL 95* 99  CO2 35* 30  GLUCOSE 167* 189*  BUN 90* 80*  CREATININE 2.57* 2.74*  CALCIUM 7.1* 7.1*  MG -- --  PHOS -- --   Liver Function Tests: No results found for this basename: AST:2,ALT:2,ALKPHOS:2,BILITOT:2,PROT:2,ALBUMIN:2 in the last 72 hours No results found for this basename: LIPASE:2,AMYLASE:2 in the last 72 hours CBC:  Basename 09/28/12 0630 09/27/12 0545  WBC 12.3* 10.2  NEUTROABS -- --  HGB 8.1* 7.8*  HCT  27.5* 27.1*  MCV 85.7 88.0  PLT 146* 148*   Cardiac Enzymes:  Basename 09/27/12 0959  CKTOTAL --  CKMB --  CKMBINDEX --  TROPONINI 0.63*   BNP: No components found with this basename: POCBNP:3 D-Dimer: No results found for this basename: DDIMER:2 in the last 72 hours Hemoglobin A1C: No results found for this basename: HGBA1C in the last 72 hours Fasting Lipid Panel: No results found for this basename: CHOL,HDL,LDLCALC,TRIG,CHOLHDL,LDLDIRECT in the last 72 hours Thyroid Function Tests: No results found for this basename: TSH,T4TOTAL,FREET3,T3FREE,THYROIDAB in the last 72 hours Anemia Panel:  Basename 09/26/12 0518  VITAMINB12 --  FOLATE --  FERRITIN 963*  TIBC 293  IRON 124  RETICCTPCT 3.7*    RADIOLOGY:  Dg Chest Port 1 View  09/27/2012  *RADIOLOGY REPORT*  Clinical Data: Respiratory failure  PORTABLE CHEST - 1 VIEW  Comparison: Prior chest x-ray 09/25/2012  Findings: No significant interval change in the appearance of the lungs.  Persistent low inspiratory volumes with bibasilar effusions and associated bibasilar opacities.  Stable cardiomegaly.  Mild pulmonary vascular congestion  without overt edema.  IMPRESSION: No significant change in the appearance of the chest with persistent low inspiratory volumes, bilateral pleural effusions and bibasilar opacities reflecting pleural fluid combined with atelectasis versus consolidation.   Original Report Authenticated By: Malachy Moan, M.D.     PHYSICAL EXAM General: NAD Neck: JVP 10 cm, no thyromegaly or thyroid nodule.  Lungs: Crackles 1/2 up lung fields bilaterally, coarse breath sounds throughout.  CV: Nondisplaced PMI.  Heart regular S1/S2, no S3/S4, no murmur.  1+ edema to knees bilaterally.  Abdomen: Soft, nontender, no hepatosplenomegaly, no distention.  Neurologic: Alert and oriented x 3.  Psych: Normal affect. Extremities: No clubbing or cyanosis.   TELEMETRY: Reviewed telemetry pt in NSR  ASSESSMENT AND  PLAN:  67 yo with possible OHS/OSA and COPD admitted with acute diastolic CHF and probable COPD exacerbation.  1. CHF: Acute on chronic diastolic CHF.  Echo was difficult but there does appear to be RV failure.  Unable to estimate PA pressure from echo but given evidence for RV pressure/volume overload, suspect there is pulmonary HTN.  Dopamine added at low dose yesterday given RV failure and rising creatinine.  She diuresed very well. Renal function basically stable.  She does still have some volume overload and is still on considerable oxygen.  - Continue current dopamine and torsemide regimen today.  2. RV failure: Difficult echo but evidence for RV failure.  Concern for pulmonary HTN in the setting of OHS/OSA,. COPD, and diastolic LV failure. Not sure RHC would be particularly helpful, needs tx of COPD and OHS/OSA. 3. Elevated troponin: Trending down.  Has had CP in the past at home.  Not, however, a primary symptom for this admission which was driven by CHF and COPD.  Will not do LHC at this point with renal failure, but when stable would at least consider functional study.  Continue ASA 81, would also given her statin.  4. COPD: Treating for COPD exacerbation with steroids, levofloxacin.  5. Acute on CKD:  Renal fxn stable with addition of low dose dopamine.   Marca Ancona 09/28/2012 8:17 AM

## 2012-09-28 NOTE — Progress Notes (Signed)
Pt is fighting the BIPAP and had pulled the head strap down around her mouth anf was unable to remove it when I entered the room.  Placed pt on 50% venti at this time and saturations are  88%.  She is not tolerating BIPAP no matter what she is told and is determined to be off of it.

## 2012-09-28 NOTE — Progress Notes (Signed)
And PULMONARY  / CRITICAL CARE MEDICINE  Name: Anita [name] MRN: [id number] DOB: [birth year]    ADMISSION DATE:  09/24/2012 CONSULTATION DATE:  1/29  REFERRING MD :  Dr. Clinton Sawyer - [facility]  CHIEF COMPLAINT:  Acute Respiratory Insufficiency  BRIEF PATIENT DESCRIPTION: 17 y/oF obese, heavy smoker (2ppd) admitted 1/29 with presumed newly diagnosed CHF exacerbation, and STEMI, acute hypercarbic respiratory failure, and acute renal failure after experiencing 3-69m worsening shortness of breath and weight gain.  Hypercarbic respiratory failure requiring BiPAP.  SIGNIFICANT EVENTS / STUDIES:  1/29 - Admit with CHF, Acute Renal Failure, NSTEMI, Hypercarbic Respiratory Failure. 1/30 - Renal US - neg for obstruction, atrophy 1/30 - 2D Echo - grade 2 diastolic dysfunction, mild RV dilation, EF 55%  LINES / TUBES: 1/29 Foley catheter  CULTURES: 1/29 Flu>>> negative 1/29 BCx2>>>NGTD >>  1/29 Urine culture >>> 1/30 RLQ abscess >> GPC, GNR >>   ANTIBIOTICS: 1/29 Levaquin>>>  SUBJECTIVE/ INTERVAL HISTORY:   Tolerated BiPAP last night Currently on venti mask Good diuresis with addition dopa 2/1  VITAL SIGNS: Temp:  [97.4 F (36.3 C)-98.3 F (36.8 C)] 97.4 F (36.3 C) (02/01 2000) Pulse Rate:  [86-112] 86  (02/02 0700) Resp:  [15-23] 15  (02/02 0700) BP: (100-137)/(49-94) 118/58 mmHg (02/02 0700) SpO2:  [84 %-98 %] 94 % (02/02 0807) FiO2 (%):  [50 %-100 %] 60 % (02/02 0700) HEMODYNAMICS:   VENTILATOR SETTINGS: Vent Mode:  [-]  FiO2 (%):  [50 %-100 %] 60 %  INTAKE / OUTPUT:  Intake/Output Summary (Last 24 hours) at 09/28/12 0908 Last data filed at 09/28/12 0700  Gross per 24 hour  Intake 1128.49 ml  Output   7650 ml  Net -6521.51 ml    PHYSICAL EXAMINATION: General:  Morbidly obese, chronically ill appearing Neuro:  Alert and oriented x3, moving all 4 extremities spontaneous, no asterexis HEENT:  Mm pink/moist, thick/ short neck Cardiovascular:  s1s2 rrr, no m/r/g Lungs:    No increased work of breathing on Ventimask. bilaterally with wheezing / crackles Abdomen:  Obese/soft, bsx4 active, tingling. Abdomen distended. Musculoskeletal:  No acute deformities Skin:  Warm/dry, BLE erythema, small pinpoint ulcerations, changes c/w with chronic venous stasis   ASSESSMENT AND PLAN  PULMONARY  Lab 09/24/12 1541 09/24/12 1314  PHART 7.329* 7.202*  PCO2ART 46.9* 70.0*  PO2ART 89.0 46.0*  HCO3 24.8* 27.4*  TCO2 26 29   A:   1) Acute hypercarbic respiratory failure - likely multifactorial secondary to pulmonary HTN for OSA/OHS + pulmonary edema, COPD, morbid obesity, deconditioning. She has almost certainly been hypoxemic at baseline for months prior to admission.  2) Suspected COPD exacerbation - significant wheezing on admission. Continue steroids, bronchodilators, and initial coverage for CAP. 3) Tobacco abuse - 2 packs per day 4) secondary PAH with R heart failure and anasarca P:   - Levaquin for CAP coverage day 5 of 8 - Diureses as BP and renal function permits, dopa started 2/1 to support BP for more volume removal - Duoneb QID. - PRN albuterol. - Steroids -rapid taper over 1-2 weeks - Mandatory Nocturnal BiPAP for presumed OSA/OHS - Smoking cesation  CARDIOVASCULAR  Lab 09/27/12 0959 09/25/12 0535 09/24/12 2325  TROPONINI 0.63* 0.97* 1.48*    Lab 09/24/12 1309  PROBNP 17719.0*   A:  1) NSTEMI -  possibly contribution acute exacerbation of chronic lung disease, chronic hypoxemia.  2) Acute diastolic CHF exacerbation- grade 2 per 2D echo 3) severe secondary PAH (diastolic dysfxn + OSA/OHS + COPD) P:  -  Continue aspirin. - Diuresis. Dopa added 2/1 - Cardiology considering need for catheterization versus noninvasive ischemia evaluation  - additional PAH w/u can be done once acute event resolves (eval for auto-immune dz, chronic VTE, etc); suspect PAH-directed meds would be very low yield in this setting  RENAL  Lab 09/28/12 0630 09/27/12 0545  09/25/12 0535  NA 144 143 --  K 3.4* 4.1 --  CL 95* 99 --  CO2 35* 30 --  BUN 90* 80* --  CREATININE 2.57* 2.74* --  GLUCOSE 167* 189* --  MG -- -- 2.7*  PHOS -- -- 8.0*   A:   1) Acute renal failure - Renal ultrasound negative for hydronephrosis, obstruction, or atrophy -unclear cause. Cardiorenal syndrome P:   - torsemide, continue slow diuresis - low dose dopa as above, pt preload dependent - potassium replacement  GASTROINTESTINAL  Lab 09/28/12 0630 09/27/12 0545 09/24/12 1309  HGB 8.1* 7.8* --  HCT 27.5* 27.1* --  AST -- -- 57*  ALT -- -- 39*  ALKPHOS -- -- 238*  BILITOT -- -- 0.6  ALBUMIN -- -- 2.9*   A:  1) Suspected congestive hepatopathy  - in setting of CHF exacerbation, but also chronic PAH 2) Hepatic steatosis - may otherwise account for the elevated LFTs  P:   - Monitor.  HEMATOLOGIC  Lab 09/28/12 0630 09/27/12 0545  WBC 12.3* 10.2  HGB 8.1* 7.8*  HCT 27.5* 27.1*  MCV 85.7 88.0  PLT 146* 148*  INR -- --   A: 1) Acute normocytic anemia - admission hemoglobin 8.9 . Prior BL hemoglobin 12-13. Cause unclear but likely total body volume overload. FOBT negative. Anemia panel > normal Fe studies, ferritin elevated 963 P:  - Monitor CBC.  INFECTIOUS  Lab 09/28/12 0630 09/27/12 0545 09/24/12 1357 09/24/12 1309  WBC 12.3* 10.2 -- --  NEUTROABS -- -- -- 7.6  LATICACIDVEN -- -- 1.2 --  PROCALCITON -- -- -- --  A:  1) UTI - urine culture pending. 2) Possible community acquired pneumonia - suggested on chest x-ray, however less consistent with chronicity of patient's history. P:   - Levaquin, to cover for both UTI and possible COPD exacerbation, CAP. - Urine culture pending.  ENDOCRINE  Lab 09/27/12 2128 09/27/12 1702 09/27/12 1156 09/27/12 0749 09/26/12 2157  GLUCAP 244* 288* 147* 164* 226*   Lab Results  Component Value Date   HGBA1C 6.6* 09/25/2012     A: DM2, newly diagnosed P:   - ISS. - Monitor CBG's.  NEUROLOGIC A:  Lethargic with CO2  narcosis - resolved   Levy Pupa, MD, PhD 09/28/2012, 9:08 AM [facility] [phone] or if no answer [phone]

## 2012-09-29 LAB — CBC
Hemoglobin: 8.8 g/dL — ABNORMAL LOW (ref 12.0–15.0)
MCH: 25.4 pg — ABNORMAL LOW (ref 26.0–34.0)
MCHC: 30.1 g/dL (ref 30.0–36.0)

## 2012-09-29 LAB — BASIC METABOLIC PANEL
BUN: 97 mg/dL — ABNORMAL HIGH (ref 6–23)
Calcium: 6.6 mg/dL — ABNORMAL LOW (ref 8.4–10.5)
GFR calc non Af Amer: 20 mL/min — ABNORMAL LOW (ref >90)
Glucose, Bld: 141 mg/dL — ABNORMAL HIGH (ref 70–99)
Sodium: 143 mEq/L (ref 135–145)

## 2012-09-29 LAB — URINE CULTURE: Colony Count: >=100000

## 2012-09-29 LAB — RENAL FUNCTION PANEL
BUN: 95 mg/dL — ABNORMAL HIGH (ref 6–23)
CO2: 43 mEq/L (ref 19–32)
Chloride: 85 mEq/L — ABNORMAL LOW (ref 96–112)
Creatinine, Ser: 2.15 mg/dL — ABNORMAL HIGH (ref 0.50–1.10)
Glucose, Bld: 239 mg/dL — ABNORMAL HIGH (ref 70–99)

## 2012-09-29 LAB — CULTURE, ROUTINE-ABSCESS: Gram Stain: NONE SEEN

## 2012-09-29 LAB — GLUCOSE, CAPILLARY
Glucose-Capillary: 119 mg/dL — ABNORMAL HIGH (ref 70–99)
Glucose-Capillary: 238 mg/dL — ABNORMAL HIGH (ref 70–99)
Glucose-Capillary: 250 mg/dL — ABNORMAL HIGH (ref 70–99)

## 2012-09-29 MED ORDER — POTASSIUM CHLORIDE CRYS ER 20 MEQ PO TBCR
40.0000 meq | EXTENDED_RELEASE_TABLET | Freq: Two times a day (BID) | ORAL | Status: AC
  Administered 2012-09-29: 20 meq via ORAL
  Administered 2012-09-29: 40 meq via ORAL
  Filled 2012-09-29 (×2): qty 2

## 2012-09-29 NOTE — Progress Notes (Signed)
And PULMONARY  / CRITICAL CARE MEDICINE  Name: Anita [name] MRN: [id number] DOB: 05/02/1946    ADMISSION DATE:  09/24/2012 CONSULTATION DATE:  1/29  REFERRING MD :  Dr. Clinton Sawyer - [facility]  CHIEF COMPLAINT:  Acute Respiratory Insufficiency  BRIEF PATIENT DESCRIPTION: 64 y/oF obese, heavy smoker (2ppd) admitted 1/29 with presumed newly diagnosed CHF exacerbation, and STEMI, acute hypercarbic respiratory failure, and acute renal failure after experiencing 3-24m worsening shortness of breath and weight gain.  Hypercarbic respiratory failure requiring BiPAP.  SIGNIFICANT EVENTS / STUDIES:  1/29 - Admit with CHF, Acute Renal Failure, NSTEMI, Hypercarbic Respiratory Failure. 1/30 - Renal US - neg for obstruction, atrophy 1/30 - 2D Echo - grade 2 diastolic dysfunction, mild RV dilation, EF 55%  LINES / TUBES: 1/29 Foley catheter  CULTURES: 1/29 Flu>>> negative 1/29 BCx2>>>NGTD >>  1/29 Urine culture >>> 1/30 RLQ abscess >> GPC, GNR >>   ANTIBIOTICS: 1/29 Levaquin>>>  SUBJECTIVE/ INTERVAL HISTORY:   Tolerated BiPAP last night Currently on venti mask Good diuresis with addition dopa 2/1  VITAL SIGNS: Temp:  [97.6 F (36.4 C)-98.5 F (36.9 C)] 98.3 F (36.8 C) (02/03 1130) Pulse Rate:  [85-108] 85  (02/03 1130) Resp:  [18-26] 18  (02/03 0800) BP: (100-145)/(45-70) 122/64 mmHg (02/03 1130) SpO2:  [84 %-99 %] 88 % (02/03 1207) FiO2 (%):  [50 %-60 %] 50 % (02/03 1207) HEMODYNAMICS:   VENTILATOR SETTINGS: Vent Mode:  [-]  FiO2 (%):  [50 %-60 %] 50 %  INTAKE / OUTPUT:  Intake/Output Summary (Last 24 hours) at 09/29/12 1237 Last data filed at 09/29/12 1200  Gross per 24 hour  Intake 1014.3 ml  Output   8100 ml  Net -7085.7 ml    PHYSICAL EXAMINATION: General:  Morbidly obese, chronically ill appearing Neuro:  Alert and oriented x3, moving all 4 extremities spontaneous, no asterexis HEENT:  Mm pink/moist, thick/ short neck Cardiovascular:  s1s2 rrr, no m/r/g Lungs:   No  increased work of breathing on Ventimask. bilaterally with wheezing / crackles Abdomen:  Obese/soft, bsx4 active, tingling. Abdomen distended. Musculoskeletal:  No acute deformities Skin:  Warm/dry, BLE erythema, small pinpoint ulcerations, changes c/w with chronic venous stasis   ASSESSMENT AND PLAN  PULMONARY  Lab 09/24/12 1541 09/24/12 1314  PHART 7.329* 7.202*  PCO2ART 46.9* 70.0*  PO2ART 89.0 46.0*  HCO3 24.8* 27.4*  TCO2 26 29   A:   1) Acute hypercarbic respiratory failure - likely multifactorial secondary to pulmonary HTN for OSA/OHS + pulmonary edema, COPD, morbid obesity, deconditioning. She has almost certainly been hypoxemic at baseline for months prior to admission.  2) Suspected COPD exacerbation - significant wheezing on admission. Continue steroids, bronchodilators, and initial coverage for CAP. 3) Tobacco abuse - 2 packs per day 4) secondary PAH with R heart failure and anasarca P:   - Levaquin for CAP coverage day 5 of 8 - Diureses as BP and renal function permits, dopa started 2/1 to support BP for more volume removal - Duoneb QID. - PRN albuterol. - Steroids -rapid taper over 1-2 weeks - Mandatory Nocturnal BiPAP for presumed OSA/OHS - Smoking cesation  CARDIOVASCULAR  Lab 09/27/12 0959 09/25/12 0535 09/24/12 2325  TROPONINI 0.63* 0.97* 1.48*    Lab 09/24/12 1309  PROBNP 17719.0*   A:  1) NSTEMI -  possibly contribution acute exacerbation of chronic lung disease, chronic hypoxemia.  2) Acute diastolic CHF exacerbation- grade 2 per 2D echo 3) severe secondary PAH (diastolic dysfxn + OSA/OHS + COPD) P:  -  Continue aspirin. - Diuresis. Dopa added 2/1, will defer that to cards. - Cardiology considering need for catheterization versus noninvasive ischemia evaluation. - ?RHC given echo findings. - Additional PAH w/u can be done once acute event resolves (eval for auto-immune dz, chronic VTE, etc); suspect PAH-directed meds would be very low yield in this  setting.  RENAL  Lab 09/29/12 1125 09/29/12 0605 09/25/12 0535  NA 141 143 --  K 3.2* 2.9* --  CL 85* 89* --  CO2 43* 40* --  BUN 95* 97* --  CREATININE 2.15* 2.44* --  GLUCOSE 239* 141* --  MG -- 1.6 2.7*  PHOS 5.3* -- 8.0*   A:   1) Acute renal failure - Renal ultrasound negative for hydronephrosis, obstruction, or atrophy -unclear cause. Cardiorenal syndrome P:   - Torsemide, continue slow diuresis. - Off dopamine, will defer to cardiology. - Potassium replacement.  GASTROINTESTINAL  Lab 09/29/12 1125 09/29/12 0605 09/28/12 0630 09/24/12 1309  HGB -- 8.8* 8.1* --  HCT -- 29.2* 27.5* --  AST -- -- -- 57*  ALT -- -- -- 39*  ALKPHOS -- -- -- 238*  BILITOT -- -- -- 0.6  ALBUMIN 3.3* -- -- 2.9*   A:  1) Suspected congestive hepatopathy  - in setting of CHF exacerbation, but also chronic PAH 2) Hepatic steatosis - may otherwise account for the elevated LFTs  P:   - Monitor.  HEMATOLOGIC  Lab 09/29/12 0605 09/28/12 0630  WBC 14.6* 12.3*  HGB 8.8* 8.1*  HCT 29.2* 27.5*  MCV 84.4 85.7  PLT 158 146*  INR -- --   A: 1) Acute normocytic anemia - admission hemoglobin 8.9 . Prior BL hemoglobin 12-13. Cause unclear but likely total body volume overload. FOBT negative. Anemia panel > normal Fe studies, ferritin elevated 963 P:  - Monitor CBC.  INFECTIOUS  Lab 09/29/12 0605 09/28/12 0630 09/24/12 1357 09/24/12 1309  WBC 14.6* 12.3* -- --  NEUTROABS -- -- -- 7.6  LATICACIDVEN -- -- 1.2 --  PROCALCITON -- -- -- --  A:  1) UTI - urine culture pending. 2) Possible community acquired pneumonia - suggested on chest x-ray, however less consistent with chronicity of patient's history. P:   - Levaquin, to cover for both UTI and possible COPD exacerbation, CAP. - Urine culture pending.  ENDOCRINE  Lab 09/29/12 1139 09/29/12 0820 09/28/12 2204 09/28/12 1640 09/28/12 1131  GLUCAP 238* 119* 271* 246* 170*   Lab Results  Component Value Date   HGBA1C 6.6* 09/25/2012      A: DM2, newly diagnosed P:   - ISS. - Monitor CBG's.  NEUROLOGIC A:  Lethargic with CO2 narcosis - resolved  Alyson Reedy, M.D. [facility] Pulmonary/Critical Care Medicine. Pager: [phone]. After hours pager: [phone].

## 2012-09-29 NOTE — Evaluation (Addendum)
Physical Therapy Evaluation Patient Details Name: Anita [name] MRN: [id number] DOB: [birth year] Today's Date: 09/29/2012 Time: 4098-1191 PT Time Calculation (min): 34 min  PT Assessment / Plan / Recommendation Clinical Impression  67 yo admitted with c/o SOB. Pt with ARF and placed on BiPap. Acute kidney injury. NSTEMI (troponin 2.26). Acute CHF. Presents to PT with below impairments affecting safety and independence with mobility. Will benefit physical therapy in the acute setting to maximize functional independence and safety in prep for d/c home with husband.     PT Assessment  Patient needs continued PT services    Follow Up Recommendations  Home health PT; 24 hour supervision/assist    Does the patient have the potential to tolerate intense rehabilitation      Barriers to Discharge        Equipment Recommendations  None recommended by PT    Recommendations for Other Services     Frequency Min 3X/week    Precautions / Restrictions Precautions Precautions: Fall;Other (comment) (O2 SATS need to stay above 88. Difficulty maintaining) Restrictions Weight Bearing Restrictions: No   Pertinent Vitals/Pain Upon entering room pt on 6 liters with SaO2 84% (fluctuating between 81-87%, unable to get her sats to goal of 88% with max cues for proper breathing technique, pt with very shallow breathing pattern); placed venturi mask (50%) and sats rose to 88% sitting in chair that quickly dropped to 82% with any mobility so session limited; RN aware, venturi mask left on as sats were 88-89% sitting in chair on venturi      Mobility  Bed Mobility Bed Mobility: Not assessed (pt in chair) Transfers Sit to Stand: 4: Min assist;From chair/3-in-1 Cues for safe hand placement with regards to RW; used RW to steady self in standing Stand to Sit: 4: Min guard;To chair/3-in-1 Details for Transfer Assistance: uncontrolled descent         PT Diagnosis: Abnormality of gait;Difficulty walking  PT  Problem List: Decreased activity tolerance;Decreased balance;Decreased mobility;Cardiopulmonary status limiting activity;Decreased knowledge of use of DME;Decreased cognition;Decreased safety awareness PT Treatment Interventions: DME instruction;Gait training;Functional mobility training;Therapeutic activities;Therapeutic exercise;Balance training;Neuromuscular re-education;Patient/family education;Cognitive remediation   PT Goals Acute Rehab PT Goals PT Goal Formulation: With patient Time For Goal Achievement: 10/06/12 Potential to Achieve Goals: Good Pt will go Supine/Side to Sit: with modified independence PT Goal: Supine/Side to Sit - Progress: Goal set today Pt will go Sit to Supine/Side: with modified independence PT Goal: Sit to Supine/Side - Progress: Goal set today Pt will go Sit to Stand: with modified independence PT Goal: Sit to Stand - Progress: Goal set today Pt will go Stand to Sit: with modified independence PT Goal: Stand to Sit - Progress: Goal set today Pt will Transfer Bed to Chair/Chair to Bed: with modified independence (while maintaining SaO2 >88%) PT Transfer Goal: Bed to Chair/Chair to Bed - Progress: Goal set today Pt will Ambulate: 16 - 50 feet;with modified independence;with least restrictive assistive device;Other (comment) (while maintaining SaO2 > 88%) PT Goal: Ambulate - Progress: Goal set today Pt will Go Up / Down Stairs: 3-5 stairs;with min assist PT Goal: Up/Down Stairs - Progress: Goal set today Pt will Perform Home Exercise Program: with supervision, verbal cues required/provided PT Goal: Perform Home Exercise Program - Progress: Goal set today  Visit Information  Last PT Received On: 09/29/12 Assistance Needed: +1    Subjective Data  Subjective: Im a normal person. Patient Stated Goal: home   Prior Functioning  Home Living Lives With: Spouse Available Help  at Discharge: Family;Available 24 hours/day Type of Home: House Home Access: Stairs  to enter [employer] of Steps: 4 Entrance Stairs-Rails: None Home Layout: One level Bathroom Shower/Tub: Forensic scientist: Standard Bathroom Accessibility: Yes How Accessible: Accessible via walker Home Adaptive Equipment: None Prior Function Level of Independence: Independent Able to Take Stairs?: Yes Driving: No Vocation: Student Comments: States she is taking classes at [employer]. PTA, had to sit down after going up steps bc of SOB Communication Communication: No difficulties Dominant Hand: Right    Cognition  Cognition Overall Cognitive Status: No family/caregiver present to determine baseline cognitive functioning Area of Impairment: Memory;Awareness of deficits;Awareness of errors;Problem solving Arousal/Alertness: Awake/alert Orientation Level: Appears intact for tasks assessed Behavior During Session: [facility] for tasks performed Memory: Decreased recall of precautions Memory Deficits: unable to recall correct positioning of hands for mobility Awareness of Errors: Assistance required to identify errors made;Assistance required to correct errors made Awareness of Errors - Other Comments: poor awareness of how breathing /low O2 affects safety and mobility Awareness of Deficits: decreased insight into deficits Problem Solving: min a functional basic Cognition - Other Comments: slow processing overall. ?baseline    Extremity/Trunk Assessment Right Upper Extremity Assessment RUE ROM/Strength/Tone: WFL for tasks assessed RUE Sensation: WFL - Light Touch;WFL - Proprioception RUE Coordination: WFL - gross/fine motor Left Upper Extremity Assessment LUE ROM/Strength/Tone: WFL for tasks assessed LUE Sensation: WFL - Light Touch;WFL - Proprioception LUE Coordination: WFL - gross/fine motor Right Lower Extremity Assessment RLE ROM/Strength/Tone: WFL for tasks assessed Left Lower Extremity Assessment LLE ROM/Strength/Tone: WFL for tasks assessed Trunk  Assessment Trunk Assessment: Normal   Balance Balance Balance Assessed: Yes Static Sitting Balance Static Sitting - Balance Support: No upper extremity supported;Feet supported Static Sitting - Level of Assistance: 6: Modified independent (Device/Increase time) Static Sitting - Comment/# of Minutes: 15 Static Standing Balance Static Standing - Balance Support: Bilateral upper extremity supported;During functional activity Static Standing - Level of Assistance: 5: Stand by assistance Static Standing - Comment/# of Minutes: 2  min  End of Session PT - End of Session Equipment Utilized During Treatment: Gait belt Activity Tolerance: Patient tolerated treatment well Patient left: in chair;with call bell/phone within reach Nurse Communication: Mobility status;Other (comment) (O2 status (needed venturi mask, this was left on))  GP     [facility] HELEN 09/29/2012, 12:20 PM

## 2012-09-29 NOTE — Progress Notes (Signed)
Patient ID: Anita [name], female   DOB: 1946/03/20, 67 y.o.   MRN: [id number] Family Medicine Teaching Service Daily Progress Note Service Page: [phone]  Subjective: refused BiPAP overnight. States feeling better otherwise.  Objective: Temp:  [97.6 F (36.4 C)-98.5 F (36.9 C)] 98.3 F (36.8 C) (02/03 1130) Pulse Rate:  [85-108] 101  (02/03 1300) Resp:  [18-26] 18  (02/03 1200) BP: (100-145)/(45-70) 110/56 mmHg (02/03 1300) SpO2:  [85 %-99 %] 91 % (02/03 1300) FiO2 (%):  [50 %-60 %] 50 % (02/03 1207) Weight:  [225 lb 12 oz (102.4 kg)] 225 lb 12 oz (102.4 kg) (02/03 1247) Exam: General: No acute distress, sitting comfortably in bed Cardiovascular: rrr, no mrg Respiratory: CTAB, minimally diminished bilaterally, no wheezes or crackles Abdomen: S, NT, ND Extremities: no edema Neuro: alert  CBC BMET   Lab 09/29/12 0605 09/28/12 0630 09/27/12 0545  WBC 14.6* 12.3* 10.2  HGB 8.8* 8.1* 7.8*  HCT 29.2* 27.5* 27.1*  PLT 158 146* 148*    Lab 09/29/12 1125 09/29/12 0605 09/28/12 0853  NA 141 143 145  K 3.2* 2.9* 3.1*  CL 85* 89* 96  CO2 43* 40* 34*  BUN 95* 97* 88*  CREATININE 2.15* 2.44* 2.52*  GLUCOSE 239* 141* 152*  CALCIUM 7.0* 6.6* 6.9*     Abscess culture growing GBS  Imaging/Diagnostic Tests: No new  Assessment/Plan: Anita [name] is a 66 y.o. year old female with little PMH but a long standing history of tobacco abuse who presents with acute SOB. In the ED, patient found to be in acute respiratory failure and was placed on BiPAP.  Further work up revealed acute kidney injury (creatinine of 2.36), NSTEMI (elevated POC Troponin of 2.26), and Acute CHF with elevated proBNP of 14782.   1. Acute Respiratory failure secondary to acute COPD exacerbation and acute CHF exacerbation -   CHF exacerbation  - Cardiology following and we greatly appreciate their help in managing this patient.  - Patient diuresed negative 18L since admission, with improving volume status.  -  continue PO Torsemide 80 mg BID renal function stable. Consider discontinuing this in the next day or two. -d/c'd dopamine drip - now on nasal cannula 4 L, will likely need continued BiPAP at night, though refused this past night.  COPD Exacerbation  - Duonebs Q4/Q2 PRN.  - Prednisone 60 mg daily (Day 4)  - PO Levaquin (Day 5 of abx)  - PT/OT ordered   2. NSTEMI - Elevated troponin of 2.26, EKG revealed no ST or T wave changes  - Troponin trended down 2.45 --> 1.48 --> 0.97  - Cardiology following and we greatly appreciate their help in managing this patient-not good candidate for cath at this time given renal impairment, consider functional study in future - Will continue daily Aspirin 81 mg.   3. Acute Kidney Injury - Likely secondary to decreased perfusion in the setting of CHF  - Creatinine stablizing: 2.36 --> 2.46 --> 2.68-->2.74-->2.57-->2.44-->2.15 - continue diuresis - daily BMET   4. ? PNA - Chest xray revealed retrocardiac opacity that could represent focal infiltrate. Patient afebrile without mild bump in WBCs to 14.6, though could be due to steroids  - UA obtained and remarkable for Large Leukocytes, too numerous to count WBC's  - Patient currently on Levaquin (Day 4) which will cover both CAP and UTI    5. Anemia  - Patient noted to be anemic with Hb of 8.9 (priors 11.9-12.9). Normocytic with MCV of 87.  -  Pt refused blood transfusion previously, stable at 8.8  - FOBT negative.   6. Breast mass  - Large, suspicious for malignancy  - Patient will need outpatient mammogram    7. Diabetes mellitus, type 2 - New diagnosis  - A1C = 6.6  - Will monitor CBG's during admission. SSI TID AC and QHS. Carb mod diet.  - Patient will need outpatient follow up-trial of lifestyle modifications vs therapy (no metformin with renal insufficiency).    8. Lower extremity ulcers  - Wound care consulted for lower extremity ulcers. Silicone foam dressings in place.   9. Abscess - R  lower abdomen  - wound culture-GBS. Should be covered by levaquin per antibiogram - Mupirocin BID  10. Hypocalcemia: likely related to acute renal failure - will order renal panel to check phosphorus - will also obtain PTH level   FEN/GI: Carb modified.  Prophylaxis: Heparin SQ.  Disposition: Pending clinical improvement.  Code Status: Full Code  Marikay Alar, MD 09/29/2012, 2:20 PM

## 2012-09-29 NOTE — Progress Notes (Signed)
CRITICAL VALUE ALERT  Critical value received:  CO2 40/K 2.9  Date of notification:  09/29/2012   Time of notification:  7:39 AM   Critical value read back:yes  Nurse who received alert:  Jola Schmidt   MD notified (1st page):  Dr Lula Olszewski   Time of first page:  7:40 AM   MD notified (2nd page):  Time of second page:  Responding MD:   Time MD responded:

## 2012-09-29 NOTE — Progress Notes (Signed)
Seen and examined.  Discussed with Cr. Sonnenberg.  Agree with his documentation and management.  Briefly,  She is slowly improving and finally diuresing.  We stopped dopamine drip.  Likely can back off torsemide and transfer to floor tomorrow.

## 2012-09-29 NOTE — Progress Notes (Signed)
Occupational Therapy Evaluation Patient Details Name: Anita [name] MRN: [id number] DOB: 07-24-1946 Today's Date: 09/29/2012 Time: 4098-1191 OT Time Calculation (min): 34 min  OT Assessment / Plan / Recommendation Clinical Impression  67 yo admitted with c/o SOB. Pt with ARF and placed on BiPap. Acute kidney injury. NSTEMI (troponin 2.26). Acute CHF. Pt will benefit from skilled OT services to max independece with ADL and functional mobility for ADL to facilitate D/C home with 24/7 S of husband and HHOT.     OT Assessment  Patient needs continued OT Services    Follow Up Recommendations  Home health OT    Barriers to Discharge None    Equipment Recommendations  3 in 1 bedside comode    Recommendations for Other Services    Frequency  Min 3X/week    Precautions / Restrictions Precautions Precautions: Fall;Other (comment) (O2 SATS need to stay above 88. Difficulty maintaining) Restrictions Weight Bearing Restrictions: No   Pertinent Vitals/Pain No c/o pain.  At rest - HR 88. BP 122/64. O2 SATS 89 50% ventimask Desat to 82 on 50% venti mask sit - stand. Unable to keep O2 SATS @88  in standing x 4 attempts   ADL  Eating/Feeding: Independent Where Assessed - Eating/Feeding: Chair Grooming: Set up Where Assessed - Grooming: Supported sitting Upper Body Bathing: Moderate assistance Where Assessed - Upper Body Bathing: Supported sitting Lower Body Bathing: Maximal assistance Where Assessed - Lower Body Bathing: Supported sit to stand Upper Body Dressing: Moderate assistance Where Assessed - Upper Body Dressing: Supported sitting Lower Body Dressing: Maximal assistance Where Assessed - Lower Body Dressing: Supported sit to Pharmacist, hospital: Minimal Dentist Method: Sit to Barista: Other (comment) (sit stand) Toileting - Clothing Manipulation and Hygiene: Moderate assistance Where Assessed - Toileting Clothing Manipulation and  Hygiene: Standing Equipment Used: Gait belt;Rolling walker Transfers/Ambulation Related to ADLs: min A.Unable to ambulate due toO2 below 88 on 50% venti mask ADL Comments: Limited by poorendurance - low O2 Sats    OT Diagnosis: Generalized weakness  OT Problem List: Decreased activity tolerance;Decreased safety awareness;Decreased knowledge of use of DME or AE;Decreased knowledge of precautions;Cardiopulmonary status limiting activity;Obesity;Other (comment) (? cognition) OT Treatment Interventions: Self-care/ADL training;Therapeutic exercise;Energy conservation;DME and/or AE instruction;Therapeutic activities;Patient/family education   OT Goals Acute Rehab OT Goals OT Goal Formulation: With patient Time For Goal Achievement: 10/13/12 Potential to Achieve Goals: Good ADL Goals Pt Will Perform Upper Body Bathing: with set-up;with supervision;Sitting at sink;Unsupported ADL Goal: Upper Body Bathing - Progress: Goal set today Pt Will Perform Lower Body Bathing: with set-up;with supervision;Sitting at sink;Standing at sink;with adaptive equipment;Unsupported;with cueing (comment type and amount) (min vc for E conservation) ADL Goal: Lower Body Bathing - Progress: Goal set today Pt Will Perform Lower Body Dressing: with set-up;with supervision;Sit to stand from chair;Unsupported;with adaptive equipment;with cueing (comment type and amount) ADL Goal: Lower Body Dressing - Progress: Goal set today Pt Will Transfer to Toilet: with modified independence;with DME;3-in-1 ADL Goal: Toilet Transfer - Progress: Goal set today Pt Will Perform Toileting - Clothing Manipulation: with modified independence;Standing ADL Goal: Toileting - Clothing Manipulation - Progress: Goal set today Pt Will Perform Toileting - Hygiene: with modified independence;Sit to stand from 3-in-1/toilet ADL Goal: Toileting - Hygiene - Progress: Goal set today Additional ADL Goal #1: Pt will vebalize 2 E conservation techniques for  ADL inidependetly ADL Goal: Additional Goal #1 - Progress: Goal set today Miscellaneous OT Goals Miscellaneous OT Goal #1: Pt will demonstrate functional moiblity for ADL @  RW level with O2 SATS remaining > 88 OT Goal: Miscellaneous Goal #1 - Progress: Goal set today  Visit Information  Last OT Received On: 09/29/12 Assistance Needed: +1 PT/OT Co-Evaluation/Treatment: Yes    Subjective Data  Subjective: I don't breath like that   Prior Functioning     Home Living Lives With: Spouse Available Help at Discharge: Family;Available 24 hours/day Type of Home: House Home Access: Stairs to enter [employer] of Steps: 4 Entrance Stairs-Rails: None Home Layout: One level Bathroom Shower/Tub: Forensic scientist: Standard Bathroom Accessibility: Yes How Accessible: Accessible via walker Home Adaptive Equipment: None Prior Function Level of Independence: Independent Able to Take Stairs?: Yes Driving: No Vocation: Student Comments: States she is taking classes at [employer]. PTA, had to sit down after going up steps bc of SOB Communication Communication: No difficulties Dominant Hand: Right         Vision/Perception Vision - History Baseline Vision: Wears glasses only for reading Patient Visual Report: No change from baseline Perception Perception: Within Functional Limits Praxis Praxis: Intact   Cognition  Cognition Overall Cognitive Status: No family/caregiver present to determine baseline cognitive functioning Area of Impairment: Memory;Awareness of deficits;Awareness of errors;Problem solving Arousal/Alertness: Awake/alert Orientation Level: Appears intact for tasks assessed Behavior During Session: [facility] for tasks performed Memory: Decreased recall of precautions Memory Deficits: unable to recall correct positioning of hands for mobility Awareness of Errors: Assistance required to identify errors made;Assistance required to correct errors  made Awareness of Errors - Other Comments: poor awareness of how breathing /low O2 affects safety and mobility Awareness of Deficits: decreased insight into deficits Problem Solving: min a functional basic Cognition - Other Comments: slow processing overall. ?baseline    Extremity/Trunk Assessment Right Upper Extremity Assessment RUE ROM/Strength/Tone: WFL for tasks assessed RUE Sensation: WFL - Light Touch;WFL - Proprioception RUE Coordination: WFL - gross/fine motor Left Upper Extremity Assessment LUE ROM/Strength/Tone: WFL for tasks assessed LUE Sensation: WFL - Light Touch;WFL - Proprioception LUE Coordination: WFL - gross/fine motor Right Lower Extremity Assessment RLE ROM/Strength/Tone: WFL for tasks assessed Left Lower Extremity Assessment LLE ROM/Strength/Tone: WFL for tasks assessed Trunk Assessment Trunk Assessment: Normal     Mobility Bed Mobility Bed Mobility: Not assessed (pt in chair) Transfers Transfers: Sit to Stand;Stand to Sit Sit to Stand: 4: Min assist;From chair/3-in-1 Stand to Sit: 4: Min guard;To chair/3-in-1 Details for Transfer Assistance: uncontrolled descent     Exercise     Balance Balance Balance Assessed: Yes Static Sitting Balance Static Sitting - Balance Support: No upper extremity supported;Feet supported Static Sitting - Level of Assistance: 6: Modified independent (Device/Increase time) Static Sitting - Comment/# of Minutes: 15 Static Standing Balance Static Standing - Balance Support: Bilateral upper extremity supported;During functional activity Static Standing - Level of Assistance: 5: Stand by assistance Static Standing - Comment/# of Minutes: 2  min   End of Session OT - End of Session Equipment Utilized During Treatment: Gait belt Activity Tolerance: Patient limited by fatigue;Treatment limited secondary to medical complications (Comment) (unable to maintain O2 sats >88) Patient left: in chair;with call bell/phone within  reach;with nursing in room Nurse Communication: Mobility status;Other (comment) (O2 SATS)  GO     Perri Aragones,HILLARY 09/29/2012, 11:50 AM Luisa Dago, OTR/L  [phone] 09/29/2012

## 2012-09-29 NOTE — Progress Notes (Signed)
Inpatient Diabetes Program Recommendations  AACE/ADA: New Consensus Statement on Inpatient Glycemic Control (2013)  Target Ranges:  Prepandial:   less than 140 mg/dL      Peak postprandial:   less than 180 mg/dL (1-2 hours)      Critically ill patients:  140 - 180 mg/dL  Results for Anita [name], Anita [name] (MRN [id number]) as of 09/29/2012 13:34  Ref. Range 09/28/2012 11:31 09/28/2012 16:40 09/28/2012 22:04 09/29/2012 08:20 09/29/2012 11:39  Glucose-Capillary Latest Range: 70-99 mg/dL 782 (H) 956 (H) 213 (H) 119 (H) 238 (H)   Inpatient Diabetes Program Recommendations Insulin - Meal Coverage: add Novolog 4 units TID with meals for elevated postprandial CBGs HgbA1C: =6.6 Thank you  Piedad Climes BSN, RN,CDE Inpatient Diabetes Coordinator [phone] (team pager)

## 2012-09-29 NOTE — Progress Notes (Signed)
Pt on [place] at 6lpm at this time.  Will place on bipap before bed.

## 2012-09-29 NOTE — Progress Notes (Signed)
Patient: Anita [name] / Admit Date: 09/24/2012 / Date of Encounter: 09/29/2012, 12:08 PM   Subjective  Denies SOB. Abd still feels somewhat swollen and legs are swollen. Weight not in computer but nursing dumped 2.5L of urine out this AM so she is still significantly diuresing. She feels anxious.   Objective   Telemetry: NSR/borderline sinus tach Physical Exam: Filed Vitals:   09/29/12 1130  BP: 122/64  Pulse: 85  Temp: 98.3 F (36.8 C)  Resp:    General: Well developed obese WF in no acute distress. Head: Normocephalic, atraumatic, sclera non-icteric, no xanthomas, nares are without discharge. Neck: JVD mildly elevated. Lungs: Diminished bilaterally. No obvious wheezes, rales or rhonchi. Heart: Reg rhythm borderline tachy S1 S2 without murmurs, rubs, or gallops.  Abdomen: Soft, rounded with normoactive bowel sounds. No rebound/guarding.  Msk:  Strength and tone appear normal for age. Extremities: No clubbing or cyanosis. 1+ edema up to knees. Dressings in place over LE ulcers.  Distal pedal pulses are 2+ and equal bilaterally. Neuro: Alert and oriented X 3. Moves all extremities spontaneously. Psych:  Responds to questions appropriately with a normal affect.    Intake/Output Summary (Last 24 hours) at 09/29/12 1208 Last data filed at 09/29/12 1200  Gross per 24 hour  Intake 1014.3 ml  Output   8100 ml  Net -7085.7 ml    Inpatient Medications:    . albuterol  5 mg Nebulization Q4H  . antiseptic oral rinse  15 mL Mouth Rinse q12n4p  . aspirin EC  81 mg Oral Daily  . atorvastatin  40 mg Oral q1800  . chlorhexidine  15 mL Mouth Rinse BID  . heparin subcutaneous  5,000 Units Subcutaneous Q8H  . insulin aspart  0-5 Units Subcutaneous QHS  . insulin aspart  0-9 Units Subcutaneous TID WC  . ipratropium  0.5 mg Nebulization Q4H  . levofloxacin  250 mg Oral Daily  . mupirocin ointment  1 application Nasal BID  . mupirocin ointment   Topical BID  . potassium chloride  40 mEq  Oral BID  . predniSONE  40 mg Oral Q breakfast  . sodium chloride  3 mL Intravenous Q12H  . torsemide  80 mg Oral BID    Labs:  [facility] 09/29/12 0605 09/28/12 0853  NA 143 145  K 2.9* 3.1*  CL 89* 96  CO2 40* 34*  GLUCOSE 141* 152*  BUN 97* 88*  CREATININE 2.44* 2.52*  CALCIUM 6.6* 6.9*  MG 1.6 --  PHOS -- --    Basename 09/29/12 0605 09/28/12 0630  WBC 14.6* 12.3*  NEUTROABS -- --  HGB 8.8* 8.1*  HCT 29.2* 27.5*  MCV 84.4 85.7  PLT 158 146*    Basename 09/27/12 0959  CKTOTAL --  CKMB --  TROPONINI 0.63*    Radiology/Studies:  US Renal Port 09/25/2012  *RADIOLOGY REPORT*  Clinical Data: Renal failure.  RENAL/URINARY TRACT ULTRASOUND COMPLETE  Comparison:  None  Findings:  Right Kidney:  11.5 cm in estimated length.  The right kidney shows no evidence of hydronephrosis, significant atrophy or focal lesion.  Left Kidney:  The left kidney was difficult to completely visualize due to overlying bowel gas.  Estimated length was 9.6 cm which may be somewhat foreshortened.  The overall appearance of the kidney is within normal limits without evidence of hydronephrosis or focal lesion.  Bladder:  The bladder is decompressed by a Foley catheter.  Incidental note is made of an enlarged and echogenic right lobe of the  liver which extends well inferior to the right kidney.  This likely reflects hepatic steatosis.  The entire liver was not imaged.  IMPRESSION: No evidence of renal obstruction or significant renal atrophy. Shorter measured left kidney may be somewhat foreshortened due to poor visualization.  Incidental note is made of probable hepatomegaly with increased echogenicity suggestive of steatosis.   Original Report Authenticated By: Irish Lack, M.D.    Dg Chest Port 1 View 09/27/2012  *RADIOLOGY REPORT*  Clinical Data: Respiratory failure  PORTABLE CHEST - 1 VIEW  Comparison: Prior chest x-ray 09/25/2012  Findings: No significant interval change in the appearance of the lungs.   Persistent low inspiratory volumes with bibasilar effusions and associated bibasilar opacities.  Stable cardiomegaly.  Mild pulmonary vascular congestion without overt edema.  IMPRESSION: No significant change in the appearance of the chest with persistent low inspiratory volumes, bilateral pleural effusions and bibasilar opacities reflecting pleural fluid combined with atelectasis versus consolidation.   Original Report Authenticated By: Malachy Moan, M.D.    Portable Chest 1 View 09/25/2012  *RADIOLOGY REPORT*  Clinical Data: Shortness of breath.  Respiratory distress.  PORTABLE CHEST - 1 VIEW  Comparison: Chest 09/24/2012.  Findings: Bibasilar airspace disease has progressed.  There is cardiomegaly.  No pneumothorax.  There are likely small bilateral pleural effusions.  IMPRESSION:  1.  Increased bibasilar airspace disease likely due to atelectasis and small effusions. 2.  Cardiomegaly.   Original Report Authenticated By: Holley Dexter, M.D.    Dg Chest Portable 1 View 09/24/2012  *RADIOLOGY REPORT*  Clinical Data: Short of breath  PORTABLE CHEST - 1 VIEW  Comparison: None.  Findings: Normal cardiac silhouette.  Retrocardiac opacity likely representing a hernia.  Ectatic aorta is another consideration. There are low lung volumes.  Mild central venous pulmonary congestion.  IMPRESSION:  1. Cardiomegaly and central venous congestion. 2.  Left retrocardiac opacity likely representing a hiatal hernia or ectatic aorta.   Original Report Authenticated By: Genevive Bi, M.D.      Assessment and Plan  67 yo with possible OHS/OSA and COPD admitted with acute diastolic CHF and probable COPD exacerbation. 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  1. Acute on chronic diastolic CHF: Echo was difficult but there does appear to be RV failure. Unable to estimate PA pressure from echo but  given evidence for RV pressure/volume overload, suspect there is pulmonary HTN. Dopamine added at low dose 09/27/12 given RV failure and rising creatinine and was discontinued this AM by primary team. Will discuss diuretic dosing with MD. -2500 UOP this AM, net -18L since admission. 2. 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 Concern for pulmonary HTN in the setting of OHS/OSA,. COPD, and diastolic LV failure. Dr. Shirlee Latch was not sure RHC would be helpful. ?eval for PE given persistent O2 demand. 3. Hypercarbic respiratory failure: still on O2. CO2 has bumped. Will discuss further diuresis with MD. 4. COPD: exacerbation per IM. 5. NSTEMI: Troponin trending down. Has had CP in the past at home. Not, however, a primary symptom for this admission which was driven by CHF and COPD. Not a good candidate for cath at present with renal function, but will consider functional study once more stable. 8. Anemia: Schistocytes present in smear. FOBT neg. Pt refused blood transfusion yesterday. ?eval for hemolytic anemia.  9. Acute renal failure: Cr normal in 05/2011. Cr slightly improved from yesterday but  BUN elevated. Follow. 10. Abnormal TSH: TSH suppressed. Recheck TSH and free T4. 11. Diabetes mellitus: newly diagnosed. 12. Leukocytosis: possibly due to steroid therapy or abscess. Afebrile. Follow. 13. Transaminitis: could be due to hepatic congestion vs steatosis. Will obtain f/u LFTs with next lab draw. 14. Breast mass suspicious for malignancy: per IM. 15. Hypokalemia: orders were already written for KCl by primary team.  Signed, Ronie Spies PA-C   History and all data above reviewed.  Patient examined.  I agree with the findings as above.  Her breathing is improved and edema is much reduced.  The patient exam reveals COR:RRR  ,  Lungs: Decreased breath sounds with few rhonchi  ,  Abd: Positive bowel sounds, no rebound no guarding, Ext Mild edema  .  All available labs, radiology testing,  previous records reviewed. Agree with documented assessment and plan. Continue current diuretic.   I will consider risk stratification with nuclear study before discharge.   Rollene Rotunda  3:33 PM  09/29/2012

## 2012-09-29 NOTE — Progress Notes (Signed)
ANTIBIOTIC CONSULT NOTE - FOLLOW UP  Pharmacy Consult for Levaquin Indication: CAP  Vital Signs: Temp: 98.5 F (36.9 C) (02/03 0800) Temp src: Oral (02/03 0800) BP: 129/62 mmHg (02/03 0800) Pulse Rate: 99  (02/03 0800) Intake/Output from previous day: 02/02 0701 - 02/03 0700 In: 985.6 [P.O.:600; I.V.:385.6] Out: 5950 [Urine:5950] Intake/Output from this shift:    Labs:  Basename 09/29/12 0605 09/28/12 0853 09/28/12 0630 09/27/12 0545  WBC 14.6* -- 12.3* 10.2  HGB 8.8* -- 8.1* 7.8*  PLT 158 -- 146* 148*  LABCREA -- -- -- --  CREATININE 2.44* 2.52* 2.57* --   Estimated Creatinine Clearance: 26.8 ml/min (by C-G formula based on Cr of 2.44). No results found for this basename: VANCOTROUGH:2,VANCOPEAK:2,VANCORANDOM:2,GENTTROUGH:2,GENTPEAK:2,GENTRANDOM:2,TOBRATROUGH:2,TOBRAPEAK:2,TOBRARND:2,AMIKACINPEAK:2,AMIKACINTROU:2,AMIKACIN:2, in the last 72 hours   Microbiology: Recent Results (from the past 720 hour(s))  CULTURE, BLOOD (ROUTINE X 2)     Status: Normal (Preliminary result)   Collection Time   09/24/12  5:30 PM      Component Value Range Status Comment   Specimen Description BLOOD LEFT ARM   Final    Special Requests BOTTLES DRAWN AEROBIC AND ANAEROBIC 10CC   Final    Culture  Setup Time 09/24/2012 22:17   Final    Culture     Final    Value:        BLOOD CULTURE RECEIVED NO GROWTH TO DATE CULTURE WILL BE HELD FOR 5 DAYS BEFORE ISSUING A FINAL NEGATIVE REPORT   Report Status PENDING   Incomplete   CULTURE, BLOOD (ROUTINE X 2)     Status: Normal (Preliminary result)   Collection Time   09/24/12  5:45 PM      Component Value Range Status Comment   Specimen Description BLOOD LEFT HAND   Final    Special Requests BOTTLES DRAWN AEROBIC ONLY 8CC   Final    Culture  Setup Time 09/24/2012 22:17   Final    Culture     Final    Value:        BLOOD CULTURE RECEIVED NO GROWTH TO DATE CULTURE WILL BE HELD FOR 5 DAYS BEFORE ISSUING A FINAL NEGATIVE REPORT   Report Status PENDING    Incomplete   URINE CULTURE     Status: Normal (Preliminary result)   Collection Time   09/24/12  6:38 PM      Component Value Range Status Comment   Specimen Description URINE, CATHETERIZED   Final    Special Requests NONE   Final    Culture  Setup Time 09/25/2012 03:10   Final    Colony Count PENDING   Incomplete    Culture Culture reincubated for better growth   Final    Report Status PENDING   Incomplete   MRSA PCR SCREENING     Status: Abnormal   Collection Time   09/24/12  8:21 PM      Component Value Range Status Comment   MRSA by PCR POSITIVE (*) NEGATIVE Final   CULTURE, ROUTINE-ABSCESS     Status: Normal   Collection Time   09/25/12  5:48 PM      Component Value Range Status Comment   Specimen Description ABSCESS ABDOMEN RLQ BELOW PANIS   Final    Special Requests NONE   Final    Gram Stain     Final    Value: NO WBC SEEN     ABUNDANT SQUAMOUS EPITHELIAL CELLS PRESENT     FEW GRAM POSITIVE COCCI IN PAIRS  RARE GRAM NEGATIVE RODS   Culture     Final    Value: FEW GROUP B STREP(S.AGALACTIAE)ISOLATED     Note: TESTING AGAINST S. AGALACTIAE NOT ROUTINELY PERFORMED DUE TO PREDICTABILITY OF AMP/PEN/VAN SUSCEPTIBILITY.   Report Status 09/29/2012 FINAL   Final     Anti-infectives     Start     Dose/Rate Route Frequency Ordered Stop   09/25/12 2100   levofloxacin (LEVAQUIN) tablet 250 mg        250 mg Oral Daily 09/25/12 2007     09/25/12 1800   Levofloxacin (LEVAQUIN) IVPB 250 mg  Status:  Discontinued        250 mg 50 mL/hr over 60 Minutes Intravenous Every 24 hours 09/24/12 1744 09/25/12 2005   09/24/12 1800   levofloxacin (LEVAQUIN) IVPB 500 mg        500 mg 100 mL/hr over 60 Minutes Intravenous To Emergency Dept 09/24/12 1744 09/24/12 1934          Assessment: 67 year old female on Levaquin day #6/8 for CAP. WBC climbing; up to 14.6 today - started on prednisone 2/2.  Afebrile.  Renal function continues to improve, SCr down to 2.44.   1/29 levaquin>  1/29  blood x2 >> 1/29 urine >> 1/30 abd abscess - rare GNR ( Group B strep) few GPC   Goal of Therapy:  Eradication of infection  Plan:  Continue Levaquin 250mg  po q24h Follow up cultures, renal function   Doris Cheadle, PharmD Clinical Pharmacist Pager: [phone] Phone: [phone] 09/29/2012 8:57 AM

## 2012-09-30 ENCOUNTER — Inpatient Hospital Stay (HOSPITAL_COMMUNITY): Payer: Medicare Other

## 2012-09-30 LAB — CULTURE, BLOOD (ROUTINE X 2)
Culture: NO GROWTH
Culture: NO GROWTH

## 2012-09-30 LAB — BASIC METABOLIC PANEL
BUN: 99 mg/dL — ABNORMAL HIGH (ref 6–23)
CO2: 41 mEq/L (ref 19–32)
Chloride: 84 mEq/L — ABNORMAL LOW (ref 96–112)
GFR calc Af Amer: 30 mL/min — ABNORMAL LOW (ref >90)
Glucose, Bld: 151 mg/dL — ABNORMAL HIGH (ref 70–99)
Potassium: 2.7 mEq/L — CL (ref 3.5–5.1)

## 2012-09-30 LAB — CBC
HCT: 29.5 % — ABNORMAL LOW (ref 36.0–46.0)
Hemoglobin: 8.9 g/dL — ABNORMAL LOW (ref 12.0–15.0)
MCV: 83.1 fL (ref 78.0–100.0)
RBC: 3.55 MIL/uL — ABNORMAL LOW (ref 3.87–5.11)
WBC: 12.6 10*3/uL — ABNORMAL HIGH (ref 4.0–10.5)

## 2012-09-30 LAB — HEPATIC FUNCTION PANEL
ALT: 25 U/L (ref 0–35)
AST: 18 U/L (ref 0–37)
Albumin: 3.1 g/dL — ABNORMAL LOW (ref 3.5–5.2)
Alkaline Phosphatase: 116 U/L (ref 39–117)
Total Protein: 6.3 g/dL (ref 6.0–8.3)

## 2012-09-30 LAB — PROTEIN / CREATININE RATIO, URINE
Protein Creatinine Ratio: 0.32 — ABNORMAL HIGH (ref 0.00–0.15)
Total Protein, Urine: 10.6 mg/dL

## 2012-09-30 LAB — GLUCOSE, CAPILLARY
Glucose-Capillary: 240 mg/dL — ABNORMAL HIGH (ref 70–99)
Glucose-Capillary: 216 mg/dL — ABNORMAL HIGH (ref 70–99)

## 2012-09-30 LAB — PTH, INTACT AND CALCIUM: Calcium, Total (PTH): 6.6 mg/dL — ABNORMAL LOW (ref 8.4–10.5)

## 2012-09-30 MED ORDER — LIVING WELL WITH DIABETES BOOK
Freq: Once | Status: AC
  Administered 2012-09-30: 14:00:00
  Filled 2012-09-30: qty 1

## 2012-09-30 MED ORDER — POTASSIUM CHLORIDE 20 MEQ/15ML (10%) PO LIQD
ORAL | Status: AC
  Administered 2012-09-30: 40 meq
  Filled 2012-09-30: qty 30

## 2012-09-30 MED ORDER — POTASSIUM CHLORIDE 10 MEQ/100ML IV SOLN
10.0000 meq | INTRAVENOUS | Status: DC
  Administered 2012-09-30: 10 meq via INTRAVENOUS
  Filled 2012-09-30: qty 100

## 2012-09-30 MED ORDER — POTASSIUM CHLORIDE 20 MEQ/15ML (10%) PO LIQD
40.0000 meq | Freq: Four times a day (QID) | ORAL | Status: AC
  Administered 2012-09-30: 40 meq via ORAL
  Filled 2012-09-30 (×2): qty 30

## 2012-09-30 MED ORDER — ALBUTEROL SULFATE (5 MG/ML) 0.5% IN NEBU
2.5000 mg | INHALATION_SOLUTION | Freq: Three times a day (TID) | RESPIRATORY_TRACT | Status: DC
  Administered 2012-09-30 – 2012-10-02 (×7): 2.5 mg via RESPIRATORY_TRACT
  Filled 2012-09-30 (×8): qty 0.5

## 2012-09-30 MED ORDER — ALBUTEROL SULFATE (5 MG/ML) 0.5% IN NEBU
2.5000 mg | INHALATION_SOLUTION | RESPIRATORY_TRACT | Status: DC | PRN
  Administered 2012-10-01: 2.5 mg via RESPIRATORY_TRACT
  Filled 2012-09-30: qty 0.5

## 2012-09-30 MED ORDER — POTASSIUM CHLORIDE CRYS ER 20 MEQ PO TBCR
40.0000 meq | EXTENDED_RELEASE_TABLET | Freq: Two times a day (BID) | ORAL | Status: AC
  Administered 2012-09-30: 40 meq via ORAL
  Filled 2012-09-30: qty 2

## 2012-09-30 MED ORDER — TORSEMIDE 20 MG PO TABS
40.0000 mg | ORAL_TABLET | Freq: Two times a day (BID) | ORAL | Status: DC
  Administered 2012-09-30 – 2012-10-02 (×4): 40 mg via ORAL
  Filled 2012-09-30 (×6): qty 2

## 2012-09-30 MED ORDER — IPRATROPIUM BROMIDE 0.02 % IN SOLN
0.5000 mg | Freq: Three times a day (TID) | RESPIRATORY_TRACT | Status: DC
  Administered 2012-09-30 – 2012-10-02 (×7): 0.5 mg via RESPIRATORY_TRACT
  Filled 2012-09-30 (×8): qty 2.5

## 2012-09-30 MED ORDER — PREDNISONE 10 MG PO TABS
30.0000 mg | ORAL_TABLET | Freq: Every day | ORAL | Status: DC
  Administered 2012-10-01: 30 mg via ORAL
  Filled 2012-09-30 (×2): qty 1

## 2012-09-30 NOTE — Progress Notes (Signed)
Visit to patient at bedside.  Son present.  Discussed with patient her new diagnosis of diabetes.  Do not think patient is appropriate for structured outpatient diabetes education.  Diabetes Education by Home Health in her own environment is probably best option of her at least initially.  If the Home Health team feels Outpatient Diabetes Education is appropriate after she has reached maximum benefits, a referral can be sought at that time.  Thank you.  Jaonna Word S. Elsie Lincoln, RN, CNS, CDE Inpatient Diabetes Program, team pager [phone]

## 2012-09-30 NOTE — Progress Notes (Signed)
Seen, examined and discussed in team rounds.  Agree with Dr. Isabella Bowens management and documentation.

## 2012-09-30 NOTE — Progress Notes (Signed)
Inpatient Diabetes Program Recommendations  AACE/ADA: New Consensus Statement on Inpatient Glycemic Control (2013)  Target Ranges:  Prepandial:   less than 140 mg/dL      Peak postprandial:   less than 180 mg/dL (1-2 hours)      Critically ill patients:  140 - 180 mg/dL   Reason for Visit: New-onset diabetes  Inpatient Diabetes Program Recommendations Insulin - Meal Coverage: add Novolog 4 units TID with meals for elevated postprandial CBGs HgbA1C: =6.6  Note: Have placed consult to dietitian for education regarding meal planning related to new diagnosis of diabetes.  Nursing staff to provide basic diabetes self-care instruction.  Living with Diabetes booklet ordered.  To have Home Health f/u after discharge.  Needs PCP for diabetes management after discharge.

## 2012-09-30 NOTE — Progress Notes (Signed)
Chart review complete.  Patient is not eligible for [facility] Care Management services because she does not have a PCP at this tme.  For any additional questions or new referrals please contact Anibal Henderson BSN RN [facility] Liaison at [phone]

## 2012-09-30 NOTE — Progress Notes (Signed)
Pt is refusing CPAP. Rt told pt that if she changed her mind to call RT. Rt will continue to monitor.

## 2012-09-30 NOTE — Progress Notes (Signed)
And PULMONARY  / CRITICAL CARE MEDICINE  Name: Anita [name] MRN: [id number] DOB: 12-11-1945    ADMISSION DATE:  09/24/2012 CONSULTATION DATE:  1/29  REFERRING MD :  Dr. Clinton Sawyer - [facility] FP  CHIEF COMPLAINT:  Acute Respiratory Insufficiency  BRIEF PATIENT DESCRIPTION: 69 y/oF obese, heavy smoker (2ppd) admitted 1/29 with presumed newly diagnosed CHF exacerbation, and STEMI, acute hypercarbic respiratory failure, and acute renal failure after experiencing 3-32m worsening shortness of breath and weight gain.  Hypercarbic respiratory failure requiring BiPAP.  SIGNIFICANT EVENTS / STUDIES:  1/29 - Admit with CHF, Acute Renal Failure, NSTEMI, Hypercarbic Respiratory Failure. 1/30 - Renal US - neg for obstruction, atrophy 1/30 - 2D Echo - grade 2 diastolic dysfunction, mild RV dilation, EF 55%  LINES / TUBES: 1/29 Foley catheter  CULTURES: 1/29 Flu>>> negative 1/29 BCx2>>>NGTD >>  1/29 Urine culture >>> 1/30 RLQ abscess >> GPC, GNR >>   ANTIBIOTICS: 1/29 Levaquin>>>  SUBJECTIVE/ INTERVAL HISTORY:   Tolerated BiPAP last night Currently on venti mask Good diuresis with addition dopa 2/1  VITAL SIGNS: Temp:  [97.2 F (36.2 C)-98.8 F (37.1 C)] 97.8 F (36.6 C) (02/04 0825) Pulse Rate:  [74-101] 84  (02/04 0825) Resp:  [18-23] 18  (02/04 0825) BP: (106-128)/(45-78) 119/55 mmHg (02/04 0825) SpO2:  [84 %-99 %] 88 % (02/04 1009) FiO2 (%):  [50 %] 50 % (02/04 1009) Weight:  [102.4 kg (225 lb 12 oz)-102.513 kg (226 lb)] 102.513 kg (226 lb) (02/04 0500) HEMODYNAMICS:   VENTILATOR SETTINGS: Vent Mode:  [-]  FiO2 (%):  [50 %] 50 %  INTAKE / OUTPUT:  Intake/Output Summary (Last 24 hours) at 09/30/12 1022 Last data filed at 09/30/12 0802  Gross per 24 hour  Intake  729.2 ml  Output   4875 ml  Net -4145.8 ml    PHYSICAL EXAMINATION: General:  Morbidly obese, chronically ill appearing Neuro:  Alert and oriented x3, moving all 4 extremities spontaneous, no asterexis HEENT:  Mm  pink/moist, thick/ short neck Cardiovascular:  s1s2 rrr, no m/r/g Lungs:   No increased work of breathing on Ventimask. bilaterally with wheezing / crackles Abdomen:  Obese/soft, bsx4 active, tingling. Abdomen distended. Musculoskeletal:  No acute deformities Skin:  Warm/dry, BLE erythema, small pinpoint ulcerations, changes c/w with chronic venous stasis   ASSESSMENT AND PLAN  PULMONARY  Lab 09/24/12 1541 09/24/12 1314  PHART 7.329* 7.202*  PCO2ART 46.9* 70.0*  PO2ART 89.0 46.0*  HCO3 24.8* 27.4*  TCO2 26 29   A:   1) Acute hypercarbic respiratory failure - likely multifactorial secondary to pulmonary HTN for OSA/OHS + pulmonary edema, COPD, morbid obesity, deconditioning. She has almost certainly been hypoxemic at baseline for months prior to admission.  2) Suspected COPD exacerbation - significant wheezing on admission. Continue steroids, bronchodilators, and initial coverage for CAP. 3) Tobacco abuse - 2 packs per day 4) secondary PAH with R heart failure and anasarca P:   - Levaquin for CAP coverage day 6 of 8 - Diureses as BP and renal function permits, off dopamine and stable. - Duoneb QID. - PRN albuterol. - Steroids -rapid taper over 1-2 weeks. - Mandatory Nocturnal CPAP for presumed OSA/OHS if patient will wear it. - Smoking cessation. - Repeat CXR today.  CARDIOVASCULAR  Lab 09/27/12 0959 09/25/12 0535 09/24/12 2325  TROPONINI 0.63* 0.97* 1.48*   Lab 09/24/12 1309  PROBNP 17719.0*   A:  1) NSTEMI -  possibly contribution acute exacerbation of chronic lung disease, chronic hypoxemia.  2) Acute diastolic  CHF exacerbation- grade 2 per 2D echo 3) severe secondary PAH (diastolic dysfxn + OSA/OHS + COPD) P:  - Continue aspirin. - Diuresis. - Cardiology considering need for catheterization versus noninvasive ischemia evaluation. - ?RHC given echo findings. - Additional PAH w/u can be done once acute event resolves (eval for auto-immune dz, chronic VTE, etc);  suspect PAH-directed meds would be very low yield in this setting.  RENAL  Lab 09/30/12 0600 09/29/12 1125 09/29/12 0605 09/25/12 0535  NA 142 141 -- --  K 2.7* 3.2* -- --  CL 84* 85* -- --  CO2 41* 43* -- --  BUN 99* 95* -- --  CREATININE 1.97* 2.15* -- --  GLUCOSE 151* 239* -- --  MG -- -- 1.6 2.7*  PHOS -- 5.3* -- 8.0*   A:   1) Acute renal failure - Renal ultrasound negative for hydronephrosis, obstruction, or atrophy -unclear cause. Cardiorenal syndrome P:   - Torsemide, continue diuresis. - Potassium replacement as ordered (unable to tolerate IV).  GASTROINTESTINAL  Lab 09/30/12 0600 09/29/12 1125 09/29/12 0605 09/24/12 1309  HGB 8.9* -- 8.8* --  HCT 29.5* -- 29.2* --  AST 18 -- -- 57*  ALT 25 -- -- 39*  ALKPHOS 116 -- -- 238*  BILITOT 0.5 -- -- 0.6  ALBUMIN 3.1* 3.3* -- --   A:  1) Suspected congestive hepatopathy  - in setting of CHF exacerbation, but also chronic PAH 2) Hepatic steatosis - may otherwise account for the elevated LFTs  P:   - Monitor.  HEMATOLOGIC  Lab 09/30/12 0600 09/29/12 0605  WBC 12.6* 14.6*  HGB 8.9* 8.8*  HCT 29.5* 29.2*  MCV 83.1 84.4  PLT 152 158  INR -- --   A: 1) Acute normocytic anemia - admission hemoglobin 8.9 . Prior BL hemoglobin 12-13. Cause unclear but likely total body volume overload. FOBT negative. Anemia panel > normal Fe studies, ferritin elevated 963 P:  - Monitor CBC.  INFECTIOUS  Lab 09/30/12 0600 09/29/12 0605 09/24/12 1357 09/24/12 1309  WBC 12.6* 14.6* -- --  NEUTROABS -- -- -- 7.6  LATICACIDVEN -- -- 1.2 --  PROCALCITON -- -- -- --  A:  1) UTI - urine culture pending. 2) Possible community acquired pneumonia - suggested on chest x-ray, however less consistent with chronicity of patient's history. P:   - Levaquin, to cover for both UTI and possible COPD exacerbation, CAP. - Urine culture pending.  ENDOCRINE  Lab 09/30/12 0826 09/29/12 2117 09/29/12 1646 09/29/12 1139 09/29/12 0820  GLUCAP 146*  237* 250* 238* 119*   Lab Results  Component Value Date   HGBA1C 6.6* 09/25/2012     A: DM2, newly diagnosed P:   - ISS. - Monitor CBG's.  NEUROLOGIC A:  Lethargic with CO2 narcosis - resolved  Alyson Reedy, M.D. [facility] Pulmonary/Critical Care Medicine. Pager: [phone]. After hours pager: [phone].

## 2012-09-30 NOTE — Progress Notes (Signed)
Came to give pt 0400 tx and pt had been removed from BIPAP.  She is now on a 6lpm [place] and saturations are at 91%

## 2012-09-30 NOTE — Care Management Note (Signed)
    Page 1 of 2   10/02/2012     5:28:42 PM   CARE MANAGEMENT NOTE 10/02/2012  Patient:  Anita [name], Anita [name]   Account Number:  [id number]  Date Initiated:  09/25/2012  Documentation initiated by:  Junius Creamer  Subjective/Objective Assessment:   adm w chf, mi, resp failure     Action/Plan:   lives w fam   Anticipated DC Date:  10/03/2012   Anticipated DC Plan:  HOME W HOME HEALTH SERVICES      DC Planning Services  CM consult      [facility] Choice  HOME HEALTH   Choice offered to / List presented to:  C-1 Patient   DME arranged  OXYGEN  BIPAP  OTHER - SEE COMMENT        HH arranged  HH-1 RN  HH-2 PT  HH-3 OT  HH-4 NURSE'S AIDE      HH agency  [facility]   Status of service:  Completed, signed off Medicare Important Message given?   (If response is "NO", the following Medicare IM given date fields will be blank) Date Medicare IM given:   Date Additional Medicare IM given:    Discharge Disposition:  HOME W HOME HEALTH SERVICES  Per UR Regulation:  Reviewed for med. necessity/level of care/duration of stay  If discussed at Long Length of Stay Meetings, dates discussed:   09/30/2012    Comments:  10/02/12 Remington Skalsky,RN,BSN [phone] PT FOR DC HOME TODAY.  [facility] NOTIFIED OF DC HOME.  PORTABLE O2 DELIVERED TO ROOM PRIOR TO DC, REST OF DME TO BE DELIVERED TO HOME.  HUSBAND TO PROVIDE CARE AT DC.  START OF CARE FOR HH 24-48H POST DC DATE.  10/01/12 Chattie Greeson,RN,BSN [phone] PT WILL NEED HOME OXYGEN WITH OXIMIZER AND BIPAP AT HS. REFERRAL TO [facility] FOR DME NEEDS.  [facility] TO OBTAIN OXIMIZER FOR PT, AS RESPIRATORY THERAPY DOES NOT PROVIDE THESE HERE. SPOKE WITH MD, BEDSIDE RN  WILL NEED TO PUT PT ON OXIMIZER AND TITRATE OXYGEN FLOW TO DETERMINE LEVEL NEEDED UPON DC.  09/30/12 1010 Henrietta Mayo RN BSN MSN CCM Pt states she was attending [facility] but is aware she will need recuperation time before she returns.  Has no PCP and does not have Medicare part B - provided pt with list  of low-cost clinics.  Pt will need home therapies and RN visits - provided list of agencies, referral made per choice.  Need for home O2 to be assessed before d/c.  1/30 0747 debbie dowell rn,bsn

## 2012-09-30 NOTE — Progress Notes (Signed)
SUBJECTIVE:  She denies any acute SOB.  She denies chest pain.     PHYSICAL EXAM Filed Vitals:   09/30/12 0836 09/30/12 1009 09/30/12 1145 09/30/12 1257  BP:   120/64 115/72  Pulse:   82 81  Temp:   98.3 F (36.8 C) 97.3 F (36.3 C)  TempSrc:   Oral Oral  Resp:    18  Height:      Weight:      SpO2: 89% 88% 91% 90%   General:  No acute distress Lungs:  Decreased breath sounds with few scattered coarse rhonchi Heart:  Distant heart sounds Abdomen:  Positive bowel sounds, no rebound no guarding Extremities:  Mild edema.    LABS: Lab Results  Component Value Date   TROPONINI 0.63* 09/27/2012   Results for orders placed during the hospital encounter of 09/24/12 (from the past 24 hour(s))  GLUCOSE, CAPILLARY     Status: Abnormal   Collection Time   09/29/12  4:46 PM      Component Value Range   Glucose-Capillary 250 (*) 70 - 99 mg/dL  GLUCOSE, CAPILLARY     Status: Abnormal   Collection Time   09/29/12  9:17 PM      Component Value Range   Glucose-Capillary 237 (*) 70 - 99 mg/dL  CBC     Status: Abnormal   Collection Time   09/30/12  6:00 AM      Component Value Range   WBC 12.6 (*) 4.0 - 10.5 K/uL   RBC 3.55 (*) 3.87 - 5.11 MIL/uL   Hemoglobin 8.9 (*) 12.0 - 15.0 g/dL   HCT 78.4 (*) 69.6 - 29.5 %   MCV 83.1  78.0 - 100.0 fL   MCH 25.1 (*) 26.0 - 34.0 pg   MCHC 30.2  30.0 - 36.0 g/dL   RDW 28.4 (*) 13.2 - 44.0 %   Platelets 152  150 - 400 K/uL  BASIC METABOLIC PANEL     Status: Abnormal   Collection Time   09/30/12  6:00 AM      Component Value Range   Sodium 142  135 - 145 mEq/L   Potassium 2.7 (*) 3.5 - 5.1 mEq/L   Chloride 84 (*) 96 - 112 mEq/L   CO2 41 (*) 19 - 32 mEq/L   Glucose, Bld 151 (*) 70 - 99 mg/dL   BUN 99 (*) 6 - 23 mg/dL   Creatinine, Ser 1.02 (*) 0.50 - 1.10 mg/dL   Calcium 6.6 (*) 8.4 - 10.5 mg/dL   GFR calc non Af Amer 25 (*) >90 mL/min   GFR calc Af Amer 30 (*) >90 mL/min  TSH     Status: Abnormal   Collection Time   09/30/12  6:00 AM   Component Value Range   TSH 0.241 (*) 0.350 - 4.500 uIU/mL  T4, FREE     Status: Normal   Collection Time   09/30/12  6:00 AM      Component Value Range   Free T4 1.04  0.80 - 1.80 ng/dL  HEPATIC FUNCTION PANEL     Status: Abnormal   Collection Time   09/30/12  6:00 AM      Component Value Range   Total Protein 6.3  6.0 - 8.3 g/dL   Albumin 3.1 (*) 3.5 - 5.2 g/dL   AST 18  0 - 37 U/L   ALT 25  0 - 35 U/L   Alkaline Phosphatase 116  39 - 117 U/L  Total Bilirubin 0.5  0.3 - 1.2 mg/dL   Bilirubin, Direct 0.2  0.0 - 0.3 mg/dL   Indirect Bilirubin 0.3  0.3 - 0.9 mg/dL  GLUCOSE, CAPILLARY     Status: Abnormal   Collection Time   09/30/12  8:26 AM      Component Value Range   Glucose-Capillary 146 (*) 70 - 99 mg/dL  GLUCOSE, CAPILLARY     Status: Abnormal   Collection Time   09/30/12 11:50 AM      Component Value Range   Glucose-Capillary 240 (*) 70 - 99 mg/dL  PROTEIN / CREATININE RATIO, URINE     Status: Abnormal   Collection Time   09/30/12 12:52 PM      Component Value Range   Creatinine, Urine 33.54     Total Protein, Urine 10.6     PROTEIN CREATININE RATIO 0.32 (*) 0.00 - 0.15    Intake/Output Summary (Last 24 hours) at 09/30/12 1639 Last data filed at 09/30/12 1300  Gross per 24 hour  Intake    580 ml  Output   3000 ml  Net  -2420 ml     ASSESSMENT AND PLAN:  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  LVEF OK.  Plan out patient stress perfusion study.     Acute on chronic lung disease:  I will discuss with pulmonary as they do mention a possible RHC but also suggest that "PAH directed" meds would likely not be helpful in this situation.  I would agree.    Anasarca:  Edema is improved.  Continue current medications.     Rollene Rotunda 09/30/2012 4:39 PM

## 2012-09-30 NOTE — Progress Notes (Addendum)
And PULMONARY  / CRITICAL CARE MEDICINE  Name: Anita [name] MRN: [id number] DOB: 1945-10-21    ADMISSION DATE:  09/24/2012 CONSULTATION DATE:  1/29  REFERRING MD :  Dr. Clinton Sawyer - [facility] FP  CHIEF COMPLAINT:  Acute Respiratory Insufficiency  BRIEF PATIENT DESCRIPTION: 33 y/oF obese, heavy smoker (2ppd) admitted 1/29 with presumed newly diagnosed CHF exacerbation, and STEMI, acute hypercarbic respiratory failure, and acute renal failure after experiencing 3-76m worsening shortness of breath and weight gain.  Hypercarbic respiratory failure requiring BiPAP.  SIGNIFICANT EVENTS / STUDIES:  1/29 - Admit with CHF, Acute Renal Failure, NSTEMI, Hypercarbic Respiratory Failure. 1/30 - Renal US - neg for obstruction, atrophy 1/30 - 2D Echo - grade 2 diastolic dysfunction, mild RV dilation, EF 55%  LINES / TUBES: 1/29 Foley catheter  CULTURES: 1/29 Flu>>> negative 1/29 BCx2>>>NGTD >>  1/29 Urine culture >>> 1/30 RLQ abscess >> GPC, GNR >>   ANTIBIOTICS: 1/29 Levaquin>>>  SUBJECTIVE/ INTERVAL HISTORY:   Tolerated BiPAP last night Currently on venti mask Good diuresis with addition dopa 2/1  VITAL SIGNS: Temp:  [97.2 F (36.2 C)-98.8 F (37.1 C)] 97.3 F (36.3 C) (02/04 1257) Pulse Rate:  [74-95] 81  (02/04 1257) Resp:  [18-23] 18  (02/04 1257) BP: (106-120)/(52-78) 115/72 mmHg (02/04 1257) SpO2:  [84 %-99 %] 90 % (02/04 1257) FiO2 (%):  [50 %] 50 % (02/04 1409) Weight:  [102.513 kg (226 lb)] 102.513 kg (226 lb) (02/04 0500) HEMODYNAMICS:   VENTILATOR SETTINGS: Vent Mode:  [-]  FiO2 (%):  [50 %] 50 %  INTAKE / OUTPUT:  Intake/Output Summary (Last 24 hours) at 09/30/12 1459 Last data filed at 09/30/12 1300  Gross per 24 hour  Intake    580 ml  Output   3400 ml  Net  -2820 ml    PHYSICAL EXAMINATION: General:  Morbidly obese, chronically ill appearing Neuro:  Alert and oriented x3, moving all 4 extremities spontaneous, no asterexis HEENT:  Mm pink/moist, thick/ short  neck Cardiovascular:  s1s2 rrr, no m/r/g Lungs:   No increased work of breathing on Ventimask. bilaterally with wheezing / crackles Abdomen:  Obese/soft, bsx4 active, tingling. Abdomen distended. Musculoskeletal:  No acute deformities Skin:  Warm/dry, BLE erythema, small pinpoint ulcerations, changes c/w with chronic venous stasis   ASSESSMENT AND PLAN  PULMONARY  Lab 09/24/12 1541 09/24/12 1314  PHART 7.329* 7.202*  PCO2ART 46.9* 70.0*  PO2ART 89.0 46.0*  HCO3 24.8* 27.4*  TCO2 26 29   A:   1) Acute hypercarbic respiratory failure - likely multifactorial secondary to pulmonary HTN for OSA/OHS + pulmonary edema, COPD, morbid obesity, deconditioning. She has almost certainly been hypoxemic at baseline for months prior to admission.  2) Suspected COPD exacerbation - significant wheezing on admission. Continue steroids, bronchodilators, and initial coverage for CAP. 3) Tobacco abuse - 2 packs per day 4) secondary PAH with R heart failure and anasarca P:   - Levaquin for CAP coverage day 7 of 8 >> stop after dose tomorrow - Diureses as BP and renal function permits, off dopamine and stable. - Duoneb QID. - PRN albuterol. - Steroids -rapid taper over 1-2 weeks >> PLEASE START TAPERING. I WILL CHANGE TO 30MG  TODAY 2/4: 30MG  X 3 DAYS, 20mg  x 3 days, ETC TO ZERO - Mandatory Nocturnal BiPAP for presumed OSA/OHS if patient will wear it >> Her ABG with pCO2 may qualify her for BiPAP. You could repeat her ABG at baseline to see if her pCO2 is > 55. If she  doesn't qualify on this basis, then she will need an outpt PSG to get it set up - we will need to accept saturations < 90%; she has chronic hypoxemia. Will need to work on setting her up for an oximizer for home with highest flow O2 we can arrange >> ask case management to start working on this through DME company - Smoking cessation. - I made her an appt at [facility] for followup, in the discharge information  CARDIOVASCULAR  Lab  09/27/12 0959 09/25/12 0535 09/24/12 2325  TROPONINI 0.63* 0.97* 1.48*    Lab 09/24/12 1309  PROBNP 17719.0*   A:  1) NSTEMI -  possibly contribution acute exacerbation of chronic lung disease, chronic hypoxemia.  2) Acute diastolic CHF exacerbation- grade 2 per 2D echo 3) severe secondary PAH (diastolic dysfxn + OSA/OHS + COPD) P:  - Continue aspirin. - Diuresis. - Cardiology considering need for catheterization versus noninvasive ischemia evaluation. - Additional PAH w/u can be done once acute event resolves (eval for auto-immune dz, chronic VTE, etc); suspect PAH-directed meds would be very low yield in this setting.  RENAL  Lab 09/30/12 0600 09/29/12 1125 09/29/12 0605 09/25/12 0535  NA 142 141 -- --  K 2.7* 3.2* -- --  CL 84* 85* -- --  CO2 41* 43* -- --  BUN 99* 95* -- --  CREATININE 1.97* 2.15* -- --  GLUCOSE 151* 239* -- --  MG -- -- 1.6 2.7*  PHOS -- 5.3* -- 8.0*   A:   1) Acute renal failure - Renal ultrasound negative for hydronephrosis, obstruction, or atrophy -unclear cause. Cardiorenal syndrome P:   - Torsemide, continue diuresis. - Potassium replacement as ordered   GASTROINTESTINAL  Lab 09/30/12 0600 09/29/12 1125 09/29/12 0605 09/24/12 1309  HGB 8.9* -- 8.8* --  HCT 29.5* -- 29.2* --  AST 18 -- -- 57*  ALT 25 -- -- 39*  ALKPHOS 116 -- -- 238*  BILITOT 0.5 -- -- 0.6  ALBUMIN 3.1* 3.3* -- --   A:  1) Suspected congestive hepatopathy  - in setting of CHF exacerbation, but also chronic PAH 2) Hepatic steatosis - may otherwise account for the elevated LFTs  P:   - Monitor.  HEMATOLOGIC  Lab 09/30/12 0600 09/29/12 0605  WBC 12.6* 14.6*  HGB 8.9* 8.8*  HCT 29.5* 29.2*  MCV 83.1 84.4  PLT 152 158  INR -- --   A: 1) Acute normocytic anemia - admission hemoglobin 8.9 . Prior BL hemoglobin 12-13. Cause unclear but likely total body volume overload. FOBT negative. Anemia panel > normal Fe studies, ferritin elevated 963 P:  - Monitor  CBC.  INFECTIOUS  Lab 09/30/12 0600 09/29/12 0605 09/24/12 1357 09/24/12 1309  WBC 12.6* 14.6* -- --  NEUTROABS -- -- -- 7.6  LATICACIDVEN -- -- 1.2 --  PROCALCITON -- -- -- --  A:  1) UTI - urine culture pending. 2) Possible community acquired pneumonia - suggested on chest x-ray, however less consistent with chronicity of patient's history. P:   - Levaquin, to cover for both UTI and possible COPD exacerbation, CAP. - Urine culture pending.  ENDOCRINE  Lab 09/30/12 1150 09/30/12 0826 09/29/12 2117 09/29/12 1646 09/29/12 1139  GLUCAP 240* 146* 237* 250* 238*   Lab Results  Component Value Date   HGBA1C 6.6* 09/25/2012     A: DM2, newly diagnosed P:   - ISS. - Monitor CBG's.  NEUROLOGIC A:  Lethargic with CO2 narcosis - resolved  Levy Pupa, MD,  PhD 09/30/2012, 3:11 PM [facility] [phone] or if no answer [phone]

## 2012-09-30 NOTE — Progress Notes (Signed)
Patient ID: Anita [name], female   DOB: 1946-08-14, 67 y.o.   MRN: [id number] Family Medicine Teaching Service Daily Progress Note Service Page: [phone]  Subjective: Doing ok this morning. Used BiPAP for a few hours last night. Wants to get up out of bed this morning.  Objective: Temp:  [97.2 F (36.2 C)-98.8 F (37.1 C)] 97.8 F (36.6 C) (02/04 0825) Pulse Rate:  [74-101] 84  (02/04 0825) Resp:  [18-23] 18  (02/04 0825) BP: (106-128)/(45-78) 119/55 mmHg (02/04 0825) SpO2:  [84 %-99 %] 89 % (02/04 0836) FiO2 (%):  [50 %] 50 % (02/04 0000) Weight:  [225 lb 12 oz (102.4 kg)-226 lb (102.513 kg)] 226 lb (102.513 kg) (02/04 0500) Exam: General: No acute distress, sitting comfortably in bed Cardiovascular: rrr, no mrg Respiratory: referred upper airway sounds, minimally diminished bilaterally, no wheezes or crackles Abdomen: S, NT, ND Extremities: no edema Neuro: alert  CBC BMET   Lab 09/30/12 0600 09/29/12 0605 09/28/12 0630  WBC 12.6* 14.6* 12.3*  HGB 8.9* 8.8* 8.1*  HCT 29.5* 29.2* 27.5*  PLT 152 158 146*    Lab 09/30/12 0600 09/29/12 1125 09/29/12 0605  NA 142 141 143  K 2.7* 3.2* 2.9*  CL 84* 85* 89*  CO2 41* 43* 40*  BUN 99* 95* 97*  CREATININE 1.97* 2.15* 2.44*  GLUCOSE 151* 239* 141*  CALCIUM 6.6* 7.0* 6.6*     Abscess culture growing GBS Alb 3.1 AST 18 ALT 25 Alk phos 116  Imaging/Diagnostic Tests: No new  Assessment/Plan: Anita [name] is a 67 y.o. year old female with little PMH but a long standing history of tobacco abuse who presents with acute SOB. In the ED, patient found to be in acute respiratory failure and was placed on BiPAP.  Further work up revealed acute kidney injury (creatinine of 2.36), NSTEMI (elevated POC Troponin of 2.26), and Acute CHF with elevated proBNP of 14782.   1. Acute Respiratory failure secondary to acute COPD exacerbation and acute CHF exacerbation -   CHF exacerbation  - Cardiology following and we greatly appreciate their  help in managing this patient.  - Patient diuresed 20 L since admission, with improving volume status.  - will d/c torsemide today - d/c'd dopamine drip 09/29/12 - now on nasal cannula 6 L, with BiPAP at night.  COPD Exacerbation  - Duonebs Q4/Q2 PRN.  - Prednisone 60 mg daily (Day 5)  - PO Levaquin (Day 6 of abx)  - PT/OT-home health PT, 24 hours supervision   2. NSTEMI - Elevated troponin of 2.26, EKG revealed no ST or T wave changes  - Troponin trended down 2.45 --> 1.48 --> 0.97  - Cardiology following and we greatly appreciate their help in managing this patient-not good candidate for cath at this time given renal impairment, consider functional study in future - Will continue daily Aspirin 81 mg.   3. Acute Kidney Injury - Likely secondary to decreased perfusion in the setting of CHF  - Creatinine stablizing: 2.36 --> 2.46 --> 2.68-->2.74-->2.57-->2.44-->2.15-->1.97 - will stop diuresis today - daily BMET   4. ? PNA - Chest xray revealed retrocardiac opacity that could represent focal infiltrate. Patient afebrile without mild bump in WBCs to 14.6, though could be due to steroids  - UA obtained and remarkable for Large Leukocytes, too numerous to count WBC's  - Patient currently on Levaquin (Day 6) which will cover both CAP and UTI    5. Anemia  - Patient noted to be anemic with  Hb of 8.9 (priors 11.9-12.9). Normocytic with MCV of 87.  - Pt refused blood transfusion previously, stable at 8.9  - FOBT negative.   6. Breast mass  - Large, suspicious for malignancy  - Patient will need outpatient mammogram    7. Diabetes mellitus, type 2 - New diagnosis  - A1C = 6.6  - Will monitor CBG's during admission. SSI TID AC and QHS. Carb mod diet.  - Patient will need outpatient follow up-trial of lifestyle modifications vs therapy (no metformin with renal insufficiency).    8. Lower extremity ulcers  - Wound care consulted for lower extremity ulcers. Silicone foam dressings in place.    9. Abscess - R lower abdomen  - wound culture-GBS. Should be covered by levaquin per antibiogram. - Mupirocin BID  10. Hypocalcemia: likely related to acute renal failure - will order renal panel to check phosphorus - will also obtain PTH level - TSH and T4 ordered   FEN/GI: Carb modified.  Prophylaxis: Heparin SQ.  Disposition: Pending clinical improvement. Transfer to telemetry today.  Code Status: Full Code  Marikay Alar, MD 09/30/2012, 9:36 AM

## 2012-10-01 ENCOUNTER — Encounter (HOSPITAL_COMMUNITY): Payer: Self-pay

## 2012-10-01 DIAGNOSIS — E119 Type 2 diabetes mellitus without complications: Secondary | ICD-10-CM

## 2012-10-01 DIAGNOSIS — J9620 Acute and chronic respiratory failure, unspecified whether with hypoxia or hypercapnia: Secondary | ICD-10-CM

## 2012-10-01 DIAGNOSIS — J449 Chronic obstructive pulmonary disease, unspecified: Secondary | ICD-10-CM

## 2012-10-01 DIAGNOSIS — J441 Chronic obstructive pulmonary disease with (acute) exacerbation: Secondary | ICD-10-CM

## 2012-10-01 DIAGNOSIS — N179 Acute kidney failure, unspecified: Secondary | ICD-10-CM

## 2012-10-01 DIAGNOSIS — I509 Heart failure, unspecified: Secondary | ICD-10-CM

## 2012-10-01 LAB — BASIC METABOLIC PANEL
BUN: 97 mg/dL — ABNORMAL HIGH (ref 6–23)
CO2: 38 mEq/L — ABNORMAL HIGH (ref 19–32)
Chloride: 89 mEq/L — ABNORMAL LOW (ref 96–112)
GFR calc non Af Amer: 28 mL/min — ABNORMAL LOW (ref >90)
Glucose, Bld: 177 mg/dL — ABNORMAL HIGH (ref 70–99)
Potassium: 3 mEq/L — ABNORMAL LOW (ref 3.5–5.1)
Sodium: 143 mEq/L (ref 135–145)

## 2012-10-01 LAB — CBC
HCT: 31.1 % — ABNORMAL LOW (ref 36.0–46.0)
RBC: 3.71 MIL/uL — ABNORMAL LOW (ref 3.87–5.11)
RDW: 23.7 % — ABNORMAL HIGH (ref 11.5–15.5)
WBC: 13.8 10*3/uL — ABNORMAL HIGH (ref 4.0–10.5)

## 2012-10-01 LAB — BLOOD GAS, ARTERIAL
Drawn by: 225631
FIO2: 0.5 %
Patient temperature: 98.6
pCO2 arterial: 62.5 mmHg (ref 35.0–45.0)
pH, Arterial: 7.415 (ref 7.350–7.450)

## 2012-10-01 LAB — GLUCOSE, CAPILLARY
Glucose-Capillary: 278 mg/dL — ABNORMAL HIGH (ref 70–99)
Glucose-Capillary: 255 mg/dL — ABNORMAL HIGH (ref 70–99)
Glucose-Capillary: 171 mg/dL — ABNORMAL HIGH (ref 70–99)

## 2012-10-01 MED ORDER — POTASSIUM CHLORIDE 20 MEQ/15ML (10%) PO LIQD
40.0000 meq | Freq: Once | ORAL | Status: AC
  Administered 2012-10-01: 40 meq via ORAL
  Filled 2012-10-01: qty 30

## 2012-10-01 MED ORDER — CALCIUM CITRATE 950 (200 CA) MG PO TABS
200.0000 mg | ORAL_TABLET | Freq: Three times a day (TID) | ORAL | Status: DC
  Administered 2012-10-01 – 2012-10-02 (×4): 200 mg via ORAL
  Filled 2012-10-01 (×6): qty 1

## 2012-10-01 NOTE — Progress Notes (Signed)
Occupational Therapy Treatment Patient Details Name: Anita [name] MRN: [id number] DOB: 05/26/1946 Today's Date: 10/01/2012 Time: 4098-1191 OT Time Calculation (min): 38 min  OT Assessment / Plan / Recommendation Comments on Treatment Session Pt making excellent progress. Pt switched from venti mask to 5L O2 [place] and able to maintain sats > 91 during functional activity and ambuation @ room. At end of session, O2 SATS 93. At rest, 96 5L. nsg aware. Feel pt will benefit f1Will continue to follow acutely. /pt will benefit from [facility] after D/C    Follow Up Recommendations  Home health OT    Barriers to Discharge       Equipment Recommendations  3 in 1 bedside comode    Recommendations for Other Services    Frequency Min 3X/week   Plan Discharge plan remains appropriate    Precautions / Restrictions Precautions Precautions: Other (comment) Precaution Comments: watch O2 sats   Pertinent Vitals/Pain No pain. O2 Sats 93-96 5Lnc    ADL  Grooming: Set up Where Assessed - Grooming: Unsupported standing Upper Body Bathing: Set up;Supervision/safety Where Assessed - Upper Body Bathing: Unsupported sitting Lower Body Bathing: Moderate assistance Where Assessed - Lower Body Bathing: Unsupported sit to stand Upper Body Dressing: Set up Where Assessed - Upper Body Dressing: Unsupported sitting Lower Body Dressing: Moderate assistance Where Assessed - Lower Body Dressing: Unsupported sit to stand Toilet Transfer: Min Pension scheme manager Method: Sit to stand;Stand pivot Acupuncturist: Bedside commode Toileting - Clothing Manipulation and Hygiene: Minimal assistance Where Assessed - Engineer, mining and Hygiene: Sit to stand from 3-in-1 or toilet Equipment Used: Gait belt;Rolling walker Transfers/Ambulation Related to ADLs: minguard RW level. used Rw during ADL task ADL Comments: O2 sats remained >91 5L [place] during bathing activity    OT Diagnosis:    OT Problem  List:   OT Treatment Interventions:     OT Goals Acute Rehab OT Goals OT Goal Formulation: With patient Time For Goal Achievement: 10/13/12 Potential to Achieve Goals: Good ADL Goals Pt Will Perform Upper Body Bathing: with set-up;with supervision;Sitting at sink;Unsupported ADL Goal: Upper Body Bathing - Progress: Met Pt Will Perform Lower Body Bathing: with set-up;with supervision;Sitting at sink;Standing at sink;with adaptive equipment;Unsupported;with cueing (comment type and amount) ADL Goal: Lower Body Bathing - Progress: Progressing toward goals Pt Will Perform Lower Body Dressing: with set-up;with supervision;Sit to stand from chair;Unsupported;with adaptive equipment;with cueing (comment type and amount) ADL Goal: Lower Body Dressing - Progress: Progressing toward goals Pt Will Transfer to Toilet: with modified independence;with DME;3-in-1 ADL Goal: Toilet Transfer - Progress: Progressing toward goals Pt Will Perform Toileting - Clothing Manipulation: with modified independence;Standing ADL Goal: Toileting - Clothing Manipulation - Progress: Progressing toward goals Pt Will Perform Toileting - Hygiene: with modified independence;Sit to stand from 3-in-1/toilet ADL Goal: Toileting - Hygiene - Progress: Progressing toward goals Additional ADL Goal #1: Pt will vebalize 2 E conservation techniques for ADL inidependetly ADL Goal: Additional Goal #1 - Progress: Progressing toward goals Miscellaneous OT Goals Miscellaneous OT Goal #1: Pt will demonstrate functional moiblity for ADL @ RW level with O2 SATS remaining > 88 OT Goal: Miscellaneous Goal #1 - Progress: Met  Visit Information  Last OT Received On: 10/01/12 Assistance Needed: +1    Subjective Data      Prior Functioning       Cognition  Cognition Overall Cognitive Status: Appears within functional limits for tasks assessed/performed Area of Impairment: Memory;Awareness of deficits Arousal/Alertness:  Awake/alert Orientation Level: Appears intact for  tasks assessed Behavior During Session: [facility] for tasks performed Memory: Decreased recall of precautions    Mobility  Bed Mobility Bed Mobility: Not assessed Details for Bed Mobility Assistance: pt in chair Transfers Transfers: Sit to Stand;Stand to Sit Sit to Stand: 4: Min guard;With upper extremity assist;From bed Stand to Sit: 4: Min guard;With upper extremity assist;To chair/3-in-1 Details for Transfer Assistance: vc for safety    Exercises      Balance Balance Balance Assessed:  (WFL for /adl) Static Standing Balance Static Standing - Balance Support: Bilateral upper extremity supported Static Standing - Level of Assistance: 5: Stand by assistance   End of Session OT - End of Session Equipment Utilized During Treatment: Gait belt Activity Tolerance: Patient tolerated treatment well Patient left: in chair;with call bell/phone within reach Nurse Communication: Other (comment);Mobility status (O2 status)  GO     Anita [name],HILLARY 10/01/2012, 5:57 PM [facility], OTR/L  [phone] 10/01/2012

## 2012-10-01 NOTE — Progress Notes (Deleted)
Reviewed w/ pt d/c instructions re: appts, meds, diet, activity. Pt verbalized understanding.

## 2012-10-01 NOTE — Progress Notes (Signed)
Patient ID: Anita [name], female   DOB: 1946/01/15, 67 y.o.   MRN: [id number] Family Medicine Teaching Service Daily Progress Note Service Page: [phone]  Subjective: States doing well this morning. States is doing fine off O2, but continues to intermittently require venti mask O2. States has been getting up around the room to walk.   Objective: Temp:  [96.6 F (35.9 C)-98.3 F (36.8 C)] 96.6 F (35.9 C) (02/05 0537) Pulse Rate:  [81-94] 82  (02/05 0537) Resp:  [18-19] 19  (02/05 0537) BP: (99-121)/(64-77) 99/66 mmHg (02/05 0537) SpO2:  [84 %-91 %] 86 % (02/05 0537) FiO2 (%):  [50 %] 50 % (02/04 2033) Weight:  [226 lb 10.1 oz (102.8 kg)] 226 lb 10.1 oz (102.8 kg) (02/05 0537) Exam: General: No acute distress, sitting comfortably in chair Cardiovascular: rrr, no mrg Respiratory: CTAB, minimally diminished bilaterally, no wheezes or crackles Abdomen: S, NT, ND Extremities: minimal edema Neuro: alert  CBC BMET   Lab 10/01/12 0555 09/30/12 0600 09/29/12 0605  WBC 13.8* 12.6* 14.6*  HGB 9.2* 8.9* 8.8*  HCT 31.1* 29.5* 29.2*  PLT 189 152 158    Lab 10/01/12 0555 09/30/12 0600 09/29/12 1125  NA 143 142 141  K 3.0* 2.7* 3.2*  CL 89* 84* 85*  CO2 38* 41* 43*  BUN 97* 99* 95*  CREATININE 1.83* 1.97* 2.15*  GLUCOSE 177* 151* 239*  CALCIUM 6.4* 6.6* 7.0*     Abscess culture growing GBS Alb 3.1 AST 18 ALT 25 Alk phos 116  PTH 258 (H) Ca 6.6 (L) T4 1.04 (nml) TSH 0.241 (L)  Blood gas on venti mask 15L: pH 7.415, CO2 62.5 O2 60.7  Imaging/Diagnostic Tests: No new  Assessment/Plan: Anita [name] is a 67 y.o. year old female with little PMH but a long standing history of tobacco abuse who presents with acute SOB, likely related to CHF and COPD exacerbation. Further work up revealed acute kidney injury (creatinine of 2.36), NSTEMI (elevated POC Troponin of 2.26), and Acute CHF with elevated proBNP of 13086.   1. Acute Respiratory failure secondary to acute COPD exacerbation  and acute CHF exacerbation. Continues to have varying O2 requirements.    CHF exacerbation  - Cardiology following and we greatly appreciate their help in managing this patient.  - Patient diuresed 21 L since admission, with improving volume status.  - lowered torsemide dose to 40 BID - d/c'd dopamine drip 09/29/12 - currently on venti mask.  COPD Exacerbation  - Duonebs Q4/Q2 PRN.  - Prednisone 60 mg daily (Day 6)-transitioned to 30 mg yesterday for taper - PO Levaquin (Day 7 of abx)  - PT/OT-home health PT, 24 hours supervision-home health orders placed for PT and O2 therapy as patient shows hypoxemia while on venti mask   2. NSTEMI - Elevated troponin of 2.26, EKG revealed no ST or T wave changes  - Troponin trended down 2.45 --> 1.48 --> 0.97  - Cardiology following and we greatly appreciate their help in managing this patient-will schedule outpatient stress test - Will continue daily Aspirin 81 mg.  - continue lipitor 40 mg  3. Acute Kidney Injury - Likely secondary to decreased perfusion in the setting of CHF  - Creatinine stablizing: 2.36 --> 2.46 --> 2.68-->2.74-->2.57-->2.44-->2.15-->1.97-->1.83 - continue torsemide 40 mg BID - daily BMET   4. ? PNA - Chest xray revealed retrocardiac opacity that could represent focal infiltrate. Patient afebrile without mild bump in WBCs to 14.6, though could be due to steroids  - UA  obtained and remarkable for Large Leukocytes, too numerous to count WBC's  - Patient currently on Levaquin (Day 7) which will cover both CAP and UTI-plan to transition to PO as discharge approaches   5. Anemia  - Patient noted to be anemic with Hb of 8.9 (priors 11.9-12.9). Normocytic with MCV of 87.  - Pt refused blood transfusion previously, stable at 9.2  - FOBT negative.   6. Breast mass  - Large, suspicious for malignancy  - Patient will need outpatient mammogram    7. Diabetes mellitus, type 2 - New diagnosis  - A1C = 6.6 - glucose remains stable  mostly in the 140's-200's  - Will monitor CBG's during admission. SSI TID AC and QHS. Carb mod diet.  - Patient will need outpatient follow up-trial of lifestyle modifications vs therapy (no metformin with renal insufficiency).    8. Lower extremity ulcers  - Wound care consulted for lower extremity ulcers. Silicone foam dressings in place.   9. Abscess - R lower abdomen  - wound culture-GBS. Should be covered by levaquin per antibiogram. - Mupirocin BID  10. Hypocalcemia: likely related to acute renal failure vs malabsorption of calcium due to vit D deficiency in setting of low calcium with appropriately elevated PTH. - will check vitamin D level - supplement as necessary   FEN/GI: Carb modified.  Prophylaxis: Heparin SQ.  Disposition: Pending clinical improvement. Aiming for discharge in the next 1-2 days as oxygen requirement improves.  Code Status: Full Code  Anita Alar, MD 10/01/2012, 8:54 AM

## 2012-10-01 NOTE — Progress Notes (Signed)
Inpatient Diabetes Program Recommendations  AACE/ADA: New Consensus Statement on Inpatient Glycemic Control (2013)  Target Ranges:  Prepandial:   less than 140 mg/dL      Peak postprandial:   less than 180 mg/dL (1-2 hours)      Critically ill patients:  140 - 180 mg/dL    Results for Anita [name], Anita [name] (MRN [id number]) as of 10/01/2012 12:56  Ref. Range 09/30/2012 08:26 09/30/2012 11:50 09/30/2012 18:10 09/30/2012 20:44  Glucose-Capillary Latest Range: 70-99 mg/dL 086 (H) 578 (H) 469 (H) 216 (H)   Results for Anita [name], Anita [name] (MRN [id number]) as of 10/01/2012 12:56  Ref. Range 10/01/2012 05:47 10/01/2012 12:11  Glucose-Capillary Latest Range: 70-99 mg/dL 244 (H) 010 (H)   Patient having significant postprandial elevations.  Currently getting Prednisone 30 mg daily.  MD, Please consider adding Novolog meal coverage to help with afternoon glucose elevations.   Recommend Novolog 4 units tid with meals while on Prednisone.  Note: Will follow. Ambrose Finland RN, MSN, CDE Diabetes Coordinator Inpatient Diabetes Program [phone]

## 2012-10-01 NOTE — Plan of Care (Signed)
Problem: Food- and Nutrition-Related Knowledge Deficit (NB-1.1) Goal: Nutrition education Formal process to instruct or train a patient/client in a skill or to impart knowledge to help patients/clients voluntarily manage or modify food choices and eating behavior to maintain or improve health.  Outcome: Completed/Met Date Met:  10/01/12  RD consulted for nutrition education regarding diabetes.     Lab Results  Component Value Date    HGBA1C 6.6* 09/25/2012    RD provided "Carbohydrate Counting for People with Diabetes" handout from the Academy of Nutrition and Dietetics.  Recommendations and guidelines reviewed.  Per diet recall, patient drinks regular soda. RD encouraged appropriate beverages (Diet soda, water, diet juice, etc).  Emphasized carbohydrate portion control at a sitting and baking or grilling meat (as she currently fries). Teach back method used.  Expect fair compliance.  Body mass index is 41.45 kg/(m^2). Pt meets criteria for Obesity Class III based on current BMI.  Current diet order is Carbohydrate Modified Medium Calorie, patient is consuming approximately 75% of meals at this time.  Labs and medications reviewed.  No further nutrition interventions warranted at this time.   Please re-consult RD as needed.  Maureen Chatters, RD, LDN Pager #: [phone] After-Hours Pager #: [phone]

## 2012-10-01 NOTE — Progress Notes (Signed)
Physical Therapy Treatment Patient Details Name: SHELL BLANCHETTE MRN: [id number] DOB: 15-Mar-1946 Today's Date: 10/01/2012 Time: 5784-6962 PT Time Calculation (min): 17 min  PT Assessment / Plan / Recommendation Comments on Treatment Session  Patient tolerated ambulation in hallway with walker with vitals stable on 50% VM.  Will need family to assist on d/c to allow her to recouperate without relapse.  Seems litle overly motivated to return to school and back to normal activity which may exacerbate symptoms.  Did leave walker in room so patient can walk several times per day with nursing assist.    Follow Up Recommendations  Home health PT;Supervision - Intermittent           Equipment Recommendations  Rolling walker with 5" wheels       Frequency Min 3X/week   Plan Discharge plan remains appropriate    Precautions / Restrictions Precautions Precautions: Fall   Pertinent Vitals/Pain Denies pain    Mobility  Bed Mobility Details for Bed Mobility Assistance: pt in chair Transfers Sit to Stand: With upper extremity assist;4: Min guard;With armrests;From chair/3-in-1 Stand to Sit: To chair/3-in-1;5: Supervision Details for Transfer Assistance: uncontrolled descent Ambulation/Gait Ambulation/Gait Assistance: 4: Min guard Ambulation Distance (Feet): 200 Feet Assistive device: Rolling walker Ambulation/Gait Assistance Details: needed redirection for how to get back to room Gait Pattern: Step-through pattern;Shuffle;Decreased stride length     PT Goals Acute Rehab PT Goals Pt will go Sit to Stand: with modified independence PT Goal: Sit to Stand - Progress: Progressing toward goal Pt will go Stand to Sit: with modified independence PT Goal: Stand to Sit - Progress: Progressing toward goal Pt will Ambulate: 16 - 50 feet;with modified independence;with least restrictive assistive device;Other (comment) (with SaO2 >88%) PT Goal: Ambulate - Progress: Progressing toward goal  Visit  Information  Last PT Received On: 10/01/13    Subjective Data  Subjective: I am ready.  Have to get out so I can get to clinical psychology program.   Cognition  Cognition Area of Impairment: Memory;Awareness of deficits Arousal/Alertness: Awake/alert Orientation Level: Appears intact for tasks assessed Behavior During Session: [facility] for tasks performed Memory: Decreased recall of precautions    Balance  Static Standing Balance Static Standing - Balance Support: Bilateral upper extremity supported Static Standing - Level of Assistance: 5: Stand by assistance  End of Session PT - End of Session Equipment Utilized During Treatment: Gait belt Activity Tolerance: Patient tolerated treatment well Patient left: in chair;with family/visitor present   GP     [facility] 10/01/2012, 4:18 PM Sheran Lawless, PT [phone] 10/01/2012

## 2012-10-01 NOTE — Progress Notes (Signed)
Seen and examined.  Discussed with Dr. Sonnenberg.  Agree with his management and documentation.   

## 2012-10-01 NOTE — Progress Notes (Signed)
And PULMONARY  / CRITICAL CARE MEDICINE  Name: Anita [name] MRN: [id number] DOB: 1946/05/21    ADMISSION DATE:  09/24/2012 CONSULTATION DATE:  1/29  REFERRING MD :  Dr. Clinton Sawyer - [facility]  CHIEF COMPLAINT:  Acute Respiratory Insufficiency  BRIEF PATIENT DESCRIPTION: 63 y/oF obese, heavy smoker (2ppd) admitted 1/29 with presumed newly diagnosed CHF exacerbation, and STEMI, acute hypercarbic respiratory failure, and acute renal failure after experiencing 3-26m worsening shortness of breath and weight gain.  Hypercarbic respiratory failure requiring BiPAP.  SIGNIFICANT EVENTS / STUDIES:  1/29 - Admit with CHF, Acute Renal Failure, NSTEMI, Hypercarbic Respiratory Failure. 1/30 - Renal US - neg for obstruction, atrophy 1/30 - 2D Echo - grade 2 diastolic dysfunction, mild RV dilation, EF 55%  LINES / TUBES: 1/29 Foley catheter  CULTURES: 1/29 Flu>>> negative 1/29 BCx2>>>NGTD >>  1/29 Urine culture >>> 1/30 RLQ abscess >> GPC, GNR >>   ANTIBIOTICS: 1/29 Levaquin>>>  SUBJECTIVE/ INTERVAL HISTORY:   Tolerated BiPAP last night Currently on venti mask Good diuresis with addition dopa 2/1  VITAL SIGNS: Temp:  [96.6 F (35.9 C)-98.3 F (36.8 C)] 96.6 F (35.9 C) (02/05 0537) Pulse Rate:  [81-94] 82  (02/05 0537) Resp:  [18-19] 19  (02/05 0537) BP: (99-121)/(64-77) 99/66 mmHg (02/05 0537) SpO2:  [84 %-91 %] 90 % (02/05 1003) FiO2 (%):  [50 %] 50 % (02/05 1003) Weight:  [102.8 kg (226 lb 10.1 oz)] 102.8 kg (226 lb 10.1 oz) (02/05 0537) HEMODYNAMICS:   VENTILATOR SETTINGS: Vent Mode:  [-]  FiO2 (%):  [50 %] 50 %  INTAKE / OUTPUT:  Intake/Output Summary (Last 24 hours) at 10/01/12 1107 Last data filed at 10/01/12 0800  Gross per 24 hour  Intake    480 ml  Output    725 ml  Net   -245 ml    PHYSICAL EXAMINATION: General:  Morbidly obese, chronically ill appearing Neuro:  Alert and oriented x3, moving all 4 extremities spontaneous, no asterexis HEENT:  Mm pink/moist,  thick/ short neck Cardiovascular:  s1s2 rrr, no m/r/g Lungs:   No increased work of breathing on Ventimask. bilaterally with wheezing / crackles Abdomen:  Obese/soft, bsx4 active, tingling. Abdomen distended. Musculoskeletal:  No acute deformities Skin:  Warm/dry, BLE erythema, small pinpoint ulcerations, changes c/w with chronic venous stasis   ASSESSMENT AND PLAN  PULMONARY  Lab 10/01/12 0820 09/24/12 1541  PHART 7.415 7.329*  PCO2ART 62.5* 46.9*  PO2ART 60.7* 89.0  HCO3 39.3* 24.8*  TCO2 41.2 26   A:   1) Acute hypercarbic respiratory failure - likely multifactorial secondary to pulmonary HTN for OSA/OHS + pulmonary edema, COPD, morbid obesity, deconditioning. She has almost certainly been hypoxemic at baseline for months prior to admission.  2) Suspected COPD exacerbation - significant wheezing on admission. Continue steroids, bronchodilators, and initial coverage for CAP. 3) Tobacco abuse - 2 packs per day 4) secondary PAH with R heart failure and anasarca P:   - Levaquin for CAP coverage day 8 of 8 >> stop TODAY, 10/01/12 - Diureses as BP and renal function permits, off dopamine and stable. - Duoneb QID. - PRN albuterol. - Steroids -rapid taper over 1-2 weeks >> PLEASE START TAPERING. CHANGED TO 30MG  2/4: 30MG  X 3 DAYS, 20mg  x 3 days, ETC TO ZERO - Mandatory Nocturnal BiPAP for presumed OSA/OHS if patient will wear it >> Her ABG does qualify her for BiPAP qhs (pCO2 = 62.5 am 2/5).  - we will need to accept saturations < 90%; she  has chronic hypoxemia. Will need to work on setting her up for an oximizer for home with highest flow O2 we can arrange >> ask case management to start working on this through DME company - Smoking cessation. - I made her an appt at [facility] for followup, in the discharge information  CARDIOVASCULAR  Lab 09/27/12 0959 09/25/12 0535 09/24/12 2325  TROPONINI 0.63* 0.97* 1.48*    Lab 09/24/12 1309  PROBNP 17719.0*   A:  1) NSTEMI -   possibly contribution acute exacerbation of chronic lung disease, chronic hypoxemia.  2) Acute diastolic CHF exacerbation- grade 2 per 2D echo 3) severe secondary PAH (diastolic dysfxn + OSA/OHS + COPD) P:  - Continue aspirin. - Diuresis. - Cardiology considering need for catheterization versus noninvasive ischemia evaluation. - Additional PAH w/u can be done once acute event resolves (eval for auto-immune dz, chronic VTE, etc); suspect PAH-directed meds would be very low yield in this setting.  RENAL  Lab 10/01/12 0555 09/30/12 0600 09/29/12 1125 09/29/12 0605 09/25/12 0535  NA 143 142 -- -- --  K 3.0* 2.7* -- -- --  CL 89* 84* -- -- --  CO2 38* 41* -- -- --  BUN 97* 99* -- -- --  CREATININE 1.83* 1.97* -- -- --  GLUCOSE 177* 151* -- -- --  MG -- -- -- 1.6 2.7*  PHOS -- -- 5.3* -- 8.0*   A:   1) Acute renal failure - Renal ultrasound negative for hydronephrosis, obstruction, or atrophy -unclear cause. Cardiorenal syndrome P:   - Torsemide, continue diuresis. - Potassium replacement as ordered   GASTROINTESTINAL  Lab 10/01/12 0555 09/30/12 0600 09/29/12 1125 09/24/12 1309  HGB 9.2* 8.9* -- --  HCT 31.1* 29.5* -- --  AST -- 18 -- 57*  ALT -- 25 -- 39*  ALKPHOS -- 116 -- 238*  BILITOT -- 0.5 -- 0.6  ALBUMIN -- 3.1* 3.3* --   A:  1) Suspected congestive hepatopathy  - in setting of CHF exacerbation, but also chronic PAH 2) Hepatic steatosis - may otherwise account for the elevated LFTs  P:   - Monitor.  HEMATOLOGIC  Lab 10/01/12 0555 09/30/12 0600  WBC 13.8* 12.6*  HGB 9.2* 8.9*  HCT 31.1* 29.5*  MCV 83.8 83.1  PLT 189 152  INR -- --   A: 1) Acute normocytic anemia - admission hemoglobin 8.9 . Prior BL hemoglobin 12-13. Cause unclear but likely total body volume overload. FOBT negative. Anemia panel > normal Fe studies, ferritin elevated 963 P:  - Monitor CBC.  INFECTIOUS  Lab 10/01/12 0555 09/30/12 0600 09/24/12 1357 09/24/12 1309  WBC 13.8* 12.6* -- --   NEUTROABS -- -- -- 7.6  LATICACIDVEN -- -- 1.2 --  PROCALCITON -- -- -- --  A:  1) UTI - urine culture pending. 2) Possible community acquired pneumonia - suggested on chest x-ray, however less consistent with chronicity of patient's history. P:   - Levaquin, to cover for both UTI and possible COPD exacerbation, CAP. - Urine culture pending.  ENDOCRINE  Lab 10/01/12 0547 09/30/12 2044 09/30/12 1810 09/30/12 1150 09/30/12 0826  GLUCAP 166* 216* 415* 240* 146*   Lab Results  Component Value Date   HGBA1C 6.6* 09/25/2012     A: DM2, newly diagnosed P:   - ISS. - Monitor CBG's.  NEUROLOGIC A:  Lethargic with CO2 narcosis - resolved  PCCM will sign off. Please call if we can help in any way  Levy Pupa, MD, PhD 10/01/2012,  11:07 AM [facility] [phone] or if no answer [phone]

## 2012-10-02 LAB — CBC
Hemoglobin: 9.7 g/dL — ABNORMAL LOW (ref 12.0–15.0)
MCH: 25.3 pg — ABNORMAL LOW (ref 26.0–34.0)
MCV: 84.6 fL (ref 78.0–100.0)
Platelets: 202 10*3/uL (ref 150–400)
RBC: 3.84 MIL/uL — ABNORMAL LOW (ref 3.87–5.11)
WBC: 14.1 10*3/uL — ABNORMAL HIGH (ref 4.0–10.5)

## 2012-10-02 LAB — BASIC METABOLIC PANEL
CO2: 38 mEq/L — ABNORMAL HIGH (ref 19–32)
Calcium: 6.9 mg/dL — ABNORMAL LOW (ref 8.4–10.5)
Chloride: 90 mEq/L — ABNORMAL LOW (ref 96–112)
Creatinine, Ser: 1.57 mg/dL — ABNORMAL HIGH (ref 0.50–1.10)
Glucose, Bld: 164 mg/dL — ABNORMAL HIGH (ref 70–99)
Sodium: 141 mEq/L (ref 135–145)

## 2012-10-02 LAB — GLUCOSE, CAPILLARY
Glucose-Capillary: 147 mg/dL — ABNORMAL HIGH (ref 70–99)
Glucose-Capillary: 128 mg/dL — ABNORMAL HIGH (ref 70–99)

## 2012-10-02 MED ORDER — CALCIUM CITRATE 950 (200 CA) MG PO TABS: 1.0000 | ORAL_TABLET | Freq: Three times a day (TID) | ORAL | Status: DC

## 2012-10-02 MED ORDER — ALBUTEROL SULFATE HFA 108 (90 BASE) MCG/ACT IN AERS: 2.0000 | INHALATION_SPRAY | Freq: Four times a day (QID) | RESPIRATORY_TRACT | Status: DC | PRN

## 2012-10-02 MED ORDER — POTASSIUM CHLORIDE 20 MEQ/15ML (10%) PO LIQD
40.0000 meq | Freq: Once | ORAL | Status: AC
  Administered 2012-10-02: 40 meq via ORAL
  Filled 2012-10-02: qty 30

## 2012-10-02 MED ORDER — ATORVASTATIN CALCIUM 40 MG PO TABS: 40.0000 mg | ORAL_TABLET | Freq: Every day | ORAL | Status: DC

## 2012-10-02 MED ORDER — ASPIRIN 81 MG PO TBEC: 81.0000 mg | DELAYED_RELEASE_TABLET | Freq: Every day | ORAL | Status: AC

## 2012-10-02 MED ORDER — LEVOFLOXACIN 250 MG PO TABS: 250.0000 mg | ORAL_TABLET | Freq: Every day | ORAL | Status: DC

## 2012-10-02 MED ORDER — TIOTROPIUM BROMIDE MONOHYDRATE 18 MCG IN CAPS: 18.0000 ug | ORAL_CAPSULE | Freq: Every day | RESPIRATORY_TRACT | Status: DC

## 2012-10-02 MED ORDER — TORSEMIDE 20 MG PO TABS: 40.0000 mg | ORAL_TABLET | Freq: Two times a day (BID) | ORAL | Status: DC

## 2012-10-02 NOTE — Discharge Summary (Signed)
Seen and examined.  Agree with DC as outlined by Dr. Sonnenberg 

## 2012-10-02 NOTE — Progress Notes (Signed)
SUBJECTIVE:  She denies any acute SOB.  She denies chest pain.  She wants to go home.  PHYSICAL EXAM Filed Vitals:   10/01/12 1747 10/01/12 2055 10/01/12 2123 10/02/12 0645  BP:   123/75 135/82  Pulse: 90 99 82 88  Temp:   98.2 F (36.8 C) 98.3 F (36.8 C)  TempSrc:   Oral Oral  Resp:  18 19 18   Height:      Weight:    224 lb 13.9 oz (102 kg)  SpO2: 96% 94% 95% 93%   General:  No acute distress Lungs:  Decreased breath sounds much improved.  Heart:  Distant heart sounds Abdomen:  Positive bowel sounds, no rebound no guarding Extremities:  Mild edema.    LABS: Lab Results  Component Value Date   TROPONINI 0.63* 09/27/2012   Results for orders placed during the hospital encounter of 09/24/12 (from the past 24 hour(s))  GLUCOSE, CAPILLARY     Status: Abnormal   Collection Time   10/01/12 12:11 PM      Component Value Range   Glucose-Capillary 278 (*) 70 - 99 mg/dL   Comment 1 Documented in Chart     Comment 2 Notify RN    GLUCOSE, CAPILLARY     Status: Abnormal   Collection Time   10/01/12  4:54 PM      Component Value Range   Glucose-Capillary 255 (*) 70 - 99 mg/dL  GLUCOSE, CAPILLARY     Status: Abnormal   Collection Time   10/01/12  9:27 PM      Component Value Range   Glucose-Capillary 171 (*) 70 - 99 mg/dL   Comment 1 Notify RN    CBC     Status: Abnormal   Collection Time   10/02/12  5:05 AM      Component Value Range   WBC 14.1 (*) 4.0 - 10.5 K/uL   RBC 3.84 (*) 3.87 - 5.11 MIL/uL   Hemoglobin 9.7 (*) 12.0 - 15.0 g/dL   HCT 29.5 (*) 62.1 - 30.8 %   MCV 84.6  78.0 - 100.0 fL   MCH 25.3 (*) 26.0 - 34.0 pg   MCHC 29.8 (*) 30.0 - 36.0 g/dL   RDW 65.7 (*) 84.6 - 96.2 %   Platelets 202  150 - 400 K/uL  BASIC METABOLIC PANEL     Status: Abnormal   Collection Time   10/02/12  5:05 AM      Component Value Range   Sodium 141  135 - 145 mEq/L   Potassium 3.4 (*) 3.5 - 5.1 mEq/L   Chloride 90 (*) 96 - 112 mEq/L   CO2 38 (*) 19 - 32 mEq/L   Glucose, Bld 164 (*) 70 -  99 mg/dL   BUN 90 (*) 6 - 23 mg/dL   Creatinine, Ser 9.52 (*) 0.50 - 1.10 mg/dL   Calcium 6.9 (*) 8.4 - 10.5 mg/dL   GFR calc non Af Amer 34 (*) >90 mL/min   GFR calc Af Amer 39 (*) >90 mL/min  GLUCOSE, CAPILLARY     Status: Abnormal   Collection Time   10/02/12  7:14 AM      Component Value Range   Glucose-Capillary 147 (*) 70 - 99 mg/dL    Intake/Output Summary (Last 24 hours) at 10/02/12 0835 Last data filed at 10/01/12 2100  Gross per 24 hour  Intake    360 ml  Output    400 ml  Net    -40 ml  ASSESSMENT AND PLAN:  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  LVEF OK.  We will see her as an outpatient and then plan an outpatient stress test.     Acute on chronic lung disease:  She was seen by Dr. Delton Coombes.  No plan for RHC at this point. We can do this as an outpatient if he requests.  However, pulmonary HTN is very likely secondary.  Manage underlying lung disease.    Anasarca:  Edema is improved.  Continue current medications.  I would suggest current dose of Demadex at discharge.  She will need close follow up of her BMET by primary MD.      Rollene Rotunda 10/02/2012 8:35 AM

## 2012-10-02 NOTE — Progress Notes (Signed)
Pt given discharge instructions and reviewed with patient and family, all questions were answered. Will discharge home with husband. Darleen Moffitt, Randall An rN

## 2012-10-02 NOTE — Progress Notes (Signed)
Patient ID: Anita [name], female   DOB: [birth year], 67 y.o.   MRN: [id number] Family Medicine Teaching Service Daily Progress Note Service Page: [phone]  Subjective: doing well this morning. Breathing better. On [place] 5L overnight with oximizer  Objective: Temp:  [98.2 F (36.8 C)-98.4 F (36.9 C)] 98.3 F (36.8 C) (02/06 0645) Pulse Rate:  [82-109] 88  (02/06 0645) Resp:  [18-19] 18  (02/06 0645) BP: (109-135)/(66-82) 135/82 mmHg (02/06 0645) SpO2:  [90 %-96 %] 92 % (02/06 0922) FiO2 (%):  [50 %] 50 % (02/05 1440) Weight:  [224 lb 13.9 oz (102 kg)] 224 lb 13.9 oz (102 kg) (02/06 0645) Exam: General: No acute distress, sitting comfortably in chair Cardiovascular: rrr, no mrg Respiratory: CTAB, minimally diminished bilaterally, no wheezes or crackles Abdomen: S, NT, ND Extremities: minimal edema Breast: mass readily palpable in right breast  LE: skin lesions with foam dressing in place, right with minimal drainage Neuro: alert  CBC BMET   Lab 10/02/12 0505 10/01/12 0555 09/30/12 0600  WBC 14.1* 13.8* 12.6*  HGB 9.7* 9.2* 8.9*  HCT 32.5* 31.1* 29.5*  PLT 202 189 152    Lab 10/02/12 0505 10/01/12 0555 09/30/12 0600  NA 141 143 142  K 3.4* 3.0* 2.7*  CL 90* 89* 84*  CO2 38* 38* 41*  BUN 90* 97* 99*  CREATININE 1.57* 1.83* 1.97*  GLUCOSE 164* 177* 151*  CALCIUM 6.9* 6.4* 6.6*     Abscess culture growing GBS Alb 3.1 AST 18 ALT 25 Alk phos 116  PTH 258 (H) Ca 6.6 (L) T4 1.04 (nml) TSH 0.241 (L)  Blood gas on venti mask 15L: pH 7.415, CO2 62.5 O2 60.7  Imaging/Diagnostic Tests: No new  Assessment/Plan: Anita [name] is a 67 y.o. year old female with little PMH but a long standing history of tobacco abuse who presents with acute SOB, likely related to CHF and COPD exacerbation. Further work up revealed acute kidney injury (creatinine of 2.36), NSTEMI (elevated POC Troponin of 2.26), and Acute CHF with elevated proBNP of 14782.   1. Acute Respiratory failure  secondary to acute COPD exacerbation and acute CHF exacerbation. Continues to have varying O2 requirements.    CHF exacerbation  - Cardiology following and we greatly appreciate their help in managing this patient.  - Patient diuresed 22 L since admission, with improving volume status.  - lowered torsemide dose to 40 BID - d/c'd dopamine drip 09/29/12 - breathing well on [place].  COPD Exacerbation  - Duonebs Q4/Q2 PRN.  - Prednisone d/c'd yesterday - PO Levaquin (Day 8 of abx)  - PT/OT-home health PT, 24 hours supervision-home health orders placed for PT, BiPAP, and oximizer   2. NSTEMI - Elevated troponin of 2.26, EKG revealed no ST or T wave changes  - Troponin trended down 2.45 --> 1.48 --> 0.97  - Cardiology following and we greatly appreciate their help in managing this patient-will schedule outpatient stress test - Will continue daily Aspirin 81 mg.  - continue lipitor 40 mg  3. Acute Kidney Injury - Likely secondary to decreased perfusion in the setting of CHF  - Creatinine stablizing: 2.36 --> 2.46 --> 2.68-->2.74-->2.57-->2.44-->2.15-->1.97-->1.83-->1.57 - continue torsemide 40 mg BID - daily BMET   4. ? PNA - Chest xray revealed retrocardiac opacity that could represent focal infiltrate. Patient afebrile with mild bump in WBCs to 14.6, though could be due to steroids  - UA obtained and remarkable for Large Leukocytes, too numerous to count WBC's  - Patient currently  on Levaquin (Day 8) which will cover both CAP and UTI-will finish 10 day course   5. Anemia  - Patient noted to be anemic with Hb of 8.9 (priors 11.9-12.9). Normocytic with MCV of 87.  - Pt refused blood transfusion previously, stable at 9.7  - FOBT negative.   6. Breast mass  - Large, suspicious for malignancy  - Patient will need outpatient mammogram    7. Diabetes mellitus, type 2 - New diagnosis  - A1C = 6.6 - glucose remains stable mostly in the 168-278  - Will monitor CBG's during admission. SSI TID AC  and QHS. Carb mod diet.  - Patient will need outpatient follow up-trial of lifestyle modifications vs therapy (no metformin with renal insufficiency).    8. Lower extremity ulcers-appear to be well healing - Wound care consulted for lower extremity ulcers. Silicone foam dressings in place.   9. Abscess - R lower abdomen  - wound culture-GBS. Should be covered by levaquin per antibiogram. - Mupirocin BID  10. Hypocalcemia: likely related to acute renal failure vs malabsorption of calcium due to vit D deficiency in setting of low calcium with appropriately elevated PTH. - supplement daily with meals  11. Hypokalemia: patient with continued low potassium. Possibly related diuresis. -will continue to supplement as necessary   FEN/GI: Carb modified.  Prophylaxis: Heparin SQ.  Disposition: likely discharge today with home health.  Code Status: Full Code  Anita Alar, MD 10/02/2012, 9:28 AM

## 2012-10-02 NOTE — Progress Notes (Signed)
Seen and examined.  Agree with Dr. Purvis Sheffield documentation and management.

## 2012-10-02 NOTE — Progress Notes (Signed)
Resting room air saturation 88%.  O2 reapplied at 3L/nasal cannula with oximizer; sats rebounded to 94-95%.   Jerrell Belfast, RN, BSN Phone #[phone]

## 2012-10-03 ENCOUNTER — Telehealth: Payer: Self-pay | Admitting: *Deleted

## 2012-10-03 NOTE — Telephone Encounter (Signed)
TCM patient. Spoke with pt she is doing well. Got all her prescribed medication except for her Wellbutrin. Pt states this medication  is very expensive, but she will get  the money and will buy the medication. Pt is aware of her appointment on 2 /21/14 at 10:00 AM.

## 2012-10-06 ENCOUNTER — Telehealth: Payer: Self-pay | Admitting: Family Medicine

## 2012-10-06 ENCOUNTER — Ambulatory Visit (INDEPENDENT_AMBULATORY_CARE_PROVIDER_SITE_OTHER): Payer: Medicaid Other | Admitting: Family Medicine

## 2012-10-06 ENCOUNTER — Encounter: Payer: Self-pay | Admitting: Family Medicine

## 2012-10-06 VITALS — BP 112/56 | HR 88 | Temp 97.8°F | Ht 60.5 in | Wt 227.5 lb

## 2012-10-06 DIAGNOSIS — D638 Anemia in other chronic diseases classified elsewhere: Secondary | ICD-10-CM

## 2012-10-06 DIAGNOSIS — I2510 Atherosclerotic heart disease of native coronary artery without angina pectoris: Secondary | ICD-10-CM | POA: Insufficient documentation

## 2012-10-06 DIAGNOSIS — D649 Anemia, unspecified: Secondary | ICD-10-CM

## 2012-10-06 DIAGNOSIS — F17200 Nicotine dependence, unspecified, uncomplicated: Secondary | ICD-10-CM

## 2012-10-06 DIAGNOSIS — J449 Chronic obstructive pulmonary disease, unspecified: Secondary | ICD-10-CM

## 2012-10-06 DIAGNOSIS — J9610 Chronic respiratory failure, unspecified whether with hypoxia or hypercapnia: Secondary | ICD-10-CM

## 2012-10-06 DIAGNOSIS — I5032 Chronic diastolic (congestive) heart failure: Secondary | ICD-10-CM

## 2012-10-06 DIAGNOSIS — N63 Unspecified lump in unspecified breast: Secondary | ICD-10-CM

## 2012-10-06 DIAGNOSIS — Z720 Tobacco use: Secondary | ICD-10-CM

## 2012-10-06 DIAGNOSIS — E119 Type 2 diabetes mellitus without complications: Secondary | ICD-10-CM

## 2012-10-06 DIAGNOSIS — N6310 Unspecified lump in the right breast, unspecified quadrant: Secondary | ICD-10-CM

## 2012-10-06 DIAGNOSIS — Z87891 Personal history of nicotine dependence: Secondary | ICD-10-CM | POA: Insufficient documentation

## 2012-10-06 DIAGNOSIS — N184 Chronic kidney disease, stage 4 (severe): Secondary | ICD-10-CM | POA: Insufficient documentation

## 2012-10-06 DIAGNOSIS — I509 Heart failure, unspecified: Secondary | ICD-10-CM

## 2012-10-06 DIAGNOSIS — N189 Chronic kidney disease, unspecified: Secondary | ICD-10-CM

## 2012-10-06 LAB — BASIC METABOLIC PANEL
Glucose, Bld: 169 mg/dL — ABNORMAL HIGH (ref 70–99)
Potassium: 3.3 mEq/L — ABNORMAL LOW (ref 3.5–5.3)
Sodium: 141 mEq/L (ref 135–145)

## 2012-10-06 MED ORDER — POTASSIUM CHLORIDE CRYS ER 20 MEQ PO TBCR: 20.0000 meq | EXTENDED_RELEASE_TABLET | Freq: Two times a day (BID) | ORAL | Status: DC

## 2012-10-06 NOTE — Assessment & Plan Note (Signed)
CBC obtained today.  Will continue to monitor closely.

## 2012-10-06 NOTE — Telephone Encounter (Signed)
Spoke with patient and received phone number to call to give verbal orders for the below. Ann @ [phone]

## 2012-10-06 NOTE — Assessment & Plan Note (Signed)
Needs to schedule mammogram soon.  Will provide patient with scheduling info.

## 2012-10-06 NOTE — Assessment & Plan Note (Signed)
Continue Torsemide 40 meq daily.   Hypokalemic today on BMP.  RX sent for KCl 40 meq daily.

## 2012-10-06 NOTE — Assessment & Plan Note (Signed)
Will need alternative to Spiriva due to medication affordability.  Will wait until patient follows up with Pulm. If no changes in therapy will prescribed Inhaled corticosteroid/bronchodilator.

## 2012-10-06 NOTE — Progress Notes (Signed)
Subjective:     Patient ID: ARIANNY PUN, female   DOB: 1945/11/12, 67 y.o.   MRN: [id number]  HPI Mrs. Leclere is a 67 year old female who presents for followup.  Patient was recently hospitalized for a number of problems (see below).  1) Chronic respiratory failure and COPD - Acute respiratory failure and COPD exacerbation during hospitalization.  Discharged on home Oxygen and Spiriva. - Patient feels much improved.  Currently on 4 L of oxygen. - Patient has not been taking Spiriva (medication is too expensive). - She is now resuming her normal activities - Patient scheduled to follow up with pulmonologist Dr. Delton Coombes - ROS: Denies SOB.  2) Chronic diastolic CHF - diagnosed via Echo during hospitalization.  Diuresed during admission.  Currently on Torsemide 40 mg BID. - Reports compliance with medication - Follow up with cardiology scheduled - ROS: Denies worsening edema, SOB, weight gain  3) CAD  - Suffered NSTEMI during admission - Patient is to follow up with cardiology for nuclear stress test  - ROS: Denies chest pain  4) CKD - stage 3 - Creatinine at discharge was 1.57  5) DM-2 - newly diagnosed. A1C 6.6. - Patient was not started on treatment at discharge. - ROS: Denies polydipsia.  Reports frequent urination secondary to diuretic.  Review of Systems     Objective:   Physical Exam  Filed Vitals:   10/06/12 1109  BP: 112/56  Pulse: 88  Temp: 97.8 F (36.6 C)   General: well appearing, NAD. Neck: supple, no thyromegaly. Heart: RRR, no murmurs, rubs, or gallops. Lungs: CTAB. No rales, rhonchi, or wheeze. Significantly decreased breath sounds particularly at the bases. Abdomen: obese, soft, nontender, nondistended. No organomegaly. Extremities: 1+ pretibial pitting edema. Skin: no rashes or lesions. Psych: normal mood and affect. Neuro: no focal deficits.    Assessment:        Plan:

## 2012-10-06 NOTE — Telephone Encounter (Signed)
Spoke with Dewayne Hatch and gave her verbal orders

## 2012-10-06 NOTE — Assessment & Plan Note (Signed)
Continue ASA and Statin.  Will need close follow up with cardiology.

## 2012-10-06 NOTE — Telephone Encounter (Signed)
Pt went to PT and they told her that she needed to get a incentives spirometry for her breathing  - needs to have orders from her PCP

## 2012-10-06 NOTE — Assessment & Plan Note (Signed)
Elevated PTH at 258.  Ca - 7.9 today.   Likely secondary to CKD/Vitamin D deficiency.   Will recheck Vitamin D at next visit.   For now, will continue Calcium supplement.

## 2012-10-06 NOTE — Assessment & Plan Note (Signed)
Will try dietary/lifestyle changes.  Will recheck A1C in 3 months (May 2014)

## 2012-10-06 NOTE — Assessment & Plan Note (Signed)
Currently on 4L [place].  Likely has pulmonary HTN as well.  Will follow up with Dr. Delton Coombes this month.  Patient will likely need sleep study and CPAP or BiPAP at night.

## 2012-10-06 NOTE — Assessment & Plan Note (Signed)
Currently stable.  Creatinine 1.56 today.  Will need to closely monitor.  Will recheck BMP at next visit.

## 2012-10-06 NOTE — Patient Instructions (Addendum)
It was nice seeing you again.  I am getting some lab work today.  I will mail you a copy of the results.  Please be sure to continue taking all of your medicines as prescribed.   Be sure to follow up with the Pulmonologist - Dr. Delton Coombes, and Cardiology.   Follow up with me in 1 month.

## 2012-10-07 ENCOUNTER — Encounter (HOSPITAL_COMMUNITY): Payer: Self-pay | Admitting: Radiology

## 2012-10-07 ENCOUNTER — Telehealth: Payer: Self-pay | Admitting: Family Medicine

## 2012-10-07 ENCOUNTER — Inpatient Hospital Stay (HOSPITAL_COMMUNITY)
Admission: AD | Admit: 2012-10-07 | Discharge: 2012-10-14 | DRG: 812 | Disposition: A | Discharge status: Home / Self Care | Payer: Medicare Other | Source: Ambulatory Visit | Attending: Family Medicine | Admitting: Family Medicine

## 2012-10-07 ENCOUNTER — Inpatient Hospital Stay (HOSPITAL_COMMUNITY): Payer: Medicare Other

## 2012-10-07 ENCOUNTER — Telehealth: Payer: Self-pay | Admitting: *Deleted

## 2012-10-07 DIAGNOSIS — C50919 Malignant neoplasm of unspecified site of unspecified female breast: Secondary | ICD-10-CM

## 2012-10-07 DIAGNOSIS — I214 Non-ST elevation (NSTEMI) myocardial infarction: Secondary | ICD-10-CM | POA: Diagnosis present

## 2012-10-07 DIAGNOSIS — J4489 Other specified chronic obstructive pulmonary disease: Secondary | ICD-10-CM | POA: Diagnosis present

## 2012-10-07 DIAGNOSIS — J9610 Chronic respiratory failure, unspecified whether with hypoxia or hypercapnia: Secondary | ICD-10-CM

## 2012-10-07 DIAGNOSIS — I509 Heart failure, unspecified: Secondary | ICD-10-CM | POA: Diagnosis present

## 2012-10-07 DIAGNOSIS — D649 Anemia, unspecified: Secondary | ICD-10-CM

## 2012-10-07 DIAGNOSIS — IMO0002 Reserved for concepts with insufficient information to code with codable children: Secondary | ICD-10-CM

## 2012-10-07 DIAGNOSIS — C7951 Secondary malignant neoplasm of bone: Secondary | ICD-10-CM

## 2012-10-07 DIAGNOSIS — Z9981 Dependence on supplemental oxygen: Secondary | ICD-10-CM

## 2012-10-07 DIAGNOSIS — M899 Disorder of bone, unspecified: Secondary | ICD-10-CM

## 2012-10-07 DIAGNOSIS — E662 Morbid (severe) obesity with alveolar hypoventilation: Secondary | ICD-10-CM

## 2012-10-07 DIAGNOSIS — K2980 Duodenitis without bleeding: Secondary | ICD-10-CM | POA: Diagnosis present

## 2012-10-07 DIAGNOSIS — N189 Chronic kidney disease, unspecified: Secondary | ICD-10-CM

## 2012-10-07 DIAGNOSIS — C768 Malignant neoplasm of other specified ill-defined sites: Secondary | ICD-10-CM | POA: Diagnosis present

## 2012-10-07 DIAGNOSIS — J449 Chronic obstructive pulmonary disease, unspecified: Secondary | ICD-10-CM | POA: Diagnosis present

## 2012-10-07 DIAGNOSIS — R197 Diarrhea, unspecified: Secondary | ICD-10-CM | POA: Diagnosis present

## 2012-10-07 DIAGNOSIS — R195 Other fecal abnormalities: Secondary | ICD-10-CM

## 2012-10-07 DIAGNOSIS — E876 Hypokalemia: Secondary | ICD-10-CM | POA: Diagnosis present

## 2012-10-07 DIAGNOSIS — D638 Anemia in other chronic diseases classified elsewhere: Secondary | ICD-10-CM | POA: Diagnosis present

## 2012-10-07 DIAGNOSIS — D62 Acute posthemorrhagic anemia: Secondary | ICD-10-CM

## 2012-10-07 DIAGNOSIS — N184 Chronic kidney disease, stage 4 (severe): Secondary | ICD-10-CM | POA: Diagnosis present

## 2012-10-07 DIAGNOSIS — C801 Malignant (primary) neoplasm, unspecified: Secondary | ICD-10-CM | POA: Diagnosis present

## 2012-10-07 DIAGNOSIS — G4733 Obstructive sleep apnea (adult) (pediatric): Secondary | ICD-10-CM | POA: Diagnosis present

## 2012-10-07 DIAGNOSIS — N6310 Unspecified lump in the right breast, unspecified quadrant: Secondary | ICD-10-CM | POA: Diagnosis present

## 2012-10-07 DIAGNOSIS — E119 Type 2 diabetes mellitus without complications: Secondary | ICD-10-CM | POA: Diagnosis present

## 2012-10-07 DIAGNOSIS — N183 Chronic kidney disease, stage 3 unspecified: Secondary | ICD-10-CM | POA: Diagnosis present

## 2012-10-07 DIAGNOSIS — I2510 Atherosclerotic heart disease of native coronary artery without angina pectoris: Secondary | ICD-10-CM | POA: Diagnosis present

## 2012-10-07 DIAGNOSIS — Z720 Tobacco use: Secondary | ICD-10-CM

## 2012-10-07 DIAGNOSIS — I5032 Chronic diastolic (congestive) heart failure: Secondary | ICD-10-CM | POA: Diagnosis present

## 2012-10-07 LAB — CBC WITH DIFFERENTIAL/PLATELET
Basophils Absolute: 0 10*3/uL (ref 0.0–0.1)
Eosinophils Relative: 1 % (ref 0–5)
HCT: 18.3 % — ABNORMAL LOW (ref 36.0–46.0)
Lymphocytes Relative: 8 % — ABNORMAL LOW (ref 12–46)
Lymphs Abs: 1 10*3/uL (ref 0.7–4.0)
MCV: 80.6 fL (ref 78.0–100.0)
Monocytes Absolute: 0.8 10*3/uL (ref 0.1–1.0)
RDW: 23.5 % — ABNORMAL HIGH (ref 11.5–15.5)
WBC: 14.2 10*3/uL — ABNORMAL HIGH (ref 4.0–10.5)

## 2012-10-07 LAB — PREPARE RBC (CROSSMATCH)

## 2012-10-07 LAB — COMPREHENSIVE METABOLIC PANEL
ALT: 21 U/L (ref 0–35)
AST: 17 U/L (ref 0–37)
Albumin: 3.1 g/dL — ABNORMAL LOW (ref 3.5–5.2)
Alkaline Phosphatase: 109 U/L (ref 39–117)
CO2: 38 mEq/L — ABNORMAL HIGH (ref 19–32)
Chloride: 93 mEq/L — ABNORMAL LOW (ref 96–112)
Creatinine, Ser: 1.52 mg/dL — ABNORMAL HIGH (ref 0.50–1.10)
GFR calc non Af Amer: 35 mL/min — ABNORMAL LOW (ref >90)
Potassium: 3 mEq/L — ABNORMAL LOW (ref 3.5–5.1)
Sodium: 141 mEq/L (ref 135–145)
Total Bilirubin: 0.4 mg/dL (ref 0.3–1.2)

## 2012-10-07 LAB — CBC
MCV: 84.3 fL (ref 78.0–100.0)
Platelets: 308 10*3/uL (ref 150–400)
RBC: 2.16 MIL/uL — ABNORMAL LOW (ref 3.87–5.11)
RDW: 24 % — ABNORMAL HIGH (ref 11.5–15.5)
WBC: 11.8 10*3/uL — ABNORMAL HIGH (ref 4.0–10.5)

## 2012-10-07 LAB — ABO/RH: ABO/RH(D): A POS

## 2012-10-07 MED ORDER — TORSEMIDE 20 MG PO TABS
40.0000 mg | ORAL_TABLET | Freq: Two times a day (BID) | ORAL | Status: DC
  Filled 2012-10-07 (×2): qty 2

## 2012-10-07 MED ORDER — SODIUM CHLORIDE 0.9 % IV SOLN
80.0000 mg | Freq: Two times a day (BID) | INTRAVENOUS | Status: DC
  Administered 2012-10-08 – 2012-10-13 (×8): 80 mg via INTRAVENOUS
  Filled 2012-10-07 (×25): qty 80

## 2012-10-07 MED ORDER — IOHEXOL 300 MG/ML  SOLN
100.0000 mL | Freq: Once | INTRAMUSCULAR | Status: AC | PRN
  Administered 2012-10-07: 100 mL via INTRAVENOUS

## 2012-10-07 MED ORDER — SODIUM CHLORIDE 0.9 % IJ SOLN
3.0000 mL | Freq: Two times a day (BID) | INTRAMUSCULAR | Status: DC
  Administered 2012-10-08 – 2012-10-11 (×3): 3 mL via INTRAVENOUS
  Administered 2012-10-12: 03:00:00 via INTRAVENOUS
  Administered 2012-10-13 – 2012-10-14 (×2): 3 mL via INTRAVENOUS

## 2012-10-07 MED ORDER — IOHEXOL 300 MG/ML  SOLN
25.0000 mL | INTRAMUSCULAR | Status: AC
  Administered 2012-10-07 (×2): 25 mL via ORAL

## 2012-10-07 MED ORDER — CALCIUM CITRATE 950 (200 CA) MG PO TABS
1.0000 | ORAL_TABLET | Freq: Three times a day (TID) | ORAL | Status: DC
  Administered 2012-10-08 – 2012-10-14 (×15): 200 mg via ORAL
  Filled 2012-10-07 (×22): qty 1

## 2012-10-07 MED ORDER — ALBUTEROL SULFATE HFA 108 (90 BASE) MCG/ACT IN AERS
2.0000 | INHALATION_SPRAY | Freq: Four times a day (QID) | RESPIRATORY_TRACT | Status: DC | PRN
  Filled 2012-10-07: qty 6.7

## 2012-10-07 MED ORDER — ASPIRIN EC 81 MG PO TBEC
81.0000 mg | DELAYED_RELEASE_TABLET | Freq: Every day | ORAL | Status: DC
  Filled 2012-10-07 (×2): qty 1

## 2012-10-07 MED ORDER — SODIUM CHLORIDE 0.9 % IV SOLN
INTRAVENOUS | Status: DC
  Administered 2012-10-07: 20:00:00 via INTRAVENOUS

## 2012-10-07 MED ORDER — ATORVASTATIN CALCIUM 40 MG PO TABS
40.0000 mg | ORAL_TABLET | Freq: Every day | ORAL | Status: DC
  Administered 2012-10-08 – 2012-10-14 (×7): 40 mg via ORAL
  Filled 2012-10-07 (×9): qty 1

## 2012-10-07 NOTE — Telephone Encounter (Signed)
Message copied by Farrell Ours on Tue Oct 07, 2012  9:20 AM ------      Message from: Tommie Sams      Created: Mon Oct 06, 2012  8:06 PM      Regarding: Medication for patient       Hi.  I sent in a Rx for Potassium for the patient.  Additionally, the patient needs information regarding mammogram. Will you call her an inform her of Rx (for low potassium due to diuretic) and give her number to call and schedule mammogram?            Thank you so very much,            Everlene Other DO ------

## 2012-10-07 NOTE — H&P (Signed)
[facility] Attending Admission Note: Anita Levy MD [phone] pager office [phone] I  have seen and examined this patient, reviewed their chart. I have discussed this patient with the resident. I agree with the resideonent's findings, assessment and care plan. I also spoke at length with Anita [name] daughter by phone.

## 2012-10-07 NOTE — Telephone Encounter (Signed)
Spoke with patient and informed her of below and gave her number to the breast center [phone]

## 2012-10-07 NOTE — Telephone Encounter (Signed)
Message copied by Farrell Ours on Tue Oct 07, 2012  8:54 AM ------      Message from: Tommie Sams      Created: Mon Oct 06, 2012  8:06 PM      Regarding: Medication for patient       Hi.  I sent in a Rx for Potassium for the patient.  Additionally, the patient needs information regarding mammogram. Will you call her an inform her of Rx (for low potassium due to diuretic) and give her number to call and schedule mammogram?            Thank you so very much,            Everlene Other DO ------

## 2012-10-07 NOTE — Addendum Note (Signed)
Addended by: Tommie Sams on: 10/07/2012 01:13 PM   Modules accepted: Orders

## 2012-10-07 NOTE — Telephone Encounter (Signed)
EMERGENCY LINE CALL: Received call from [facility] with critical value of hemoglobin 5.8- was repeated and verified.  As pt was stable in clinic yesterday, will have PCP follow this up during business hours.

## 2012-10-07 NOTE — H&P (Signed)
Family Medicine Teaching [name] Admission H&P Service Pager: [phone]  Patient name: Anita [name] Medical record number: [id number] Date of birth: 1946/07/25 Age: 67 y.o. Gender: female  Primary Care Provider: Everlene Other, DO Consultants: None  CC  Severe Anemia  HPI  Anita [name] is a 67 y.o. year old female presenting following lab draw yesterday with her new PCP - Anita Other, DO @ [facility] - for follow up from her recent hospitalization for respiratory failure, Acute Diastolic CHF exacerbation, Acute COPD exacerbation, NSTEMI, Acute Kidney Injury, Anemia, & newly found Breast Mass.  She was doing well post hospitalization and reports significant improvement in her respiratory status as well as her lower sure to swelling.  She denies any orthostasis, no pallor.  She reports having regained and a significant amount of her energy and even went grocery shopping following her office visit with Dr. Adriana [name] yesterday.  She seen by Dr. Adriana [name] yesterday and a hemoglobin level of 5.8 was found.  It was felt best to bring her in for closer monitoring and consideration of transfusion [name] her history of diastolic heart failure and recent NSTEMI.   ROS   Constitutional  feeling better.  He is wearing oxygen at home.    Infectious  no fevers no chills   Resp  persistent cough, but improves, non-productive, no hematemesis.  Wearing 4 L of oxygen at home.  Using BiPAP at night   Cardiac  no chest pain, no palpitations no orthostasis, no syncope   GI  no stomach upset, no reflux like symptoms, no nausea no vomiting, no constipation, no diarrhea, 1 dark bowel movement per day since being home.  Not overtly black and tarry, no steatorrhea   GU no dysuria, no hematuria no frequency, no vaginal bleeding  Skin continue skin breakdown and lower sure he is, but peripheral extremities less swollen   Psych unchanged   Neuro no focal weakness, no headaches   MSK generally debilitated however has been  ambulating without assistance   Trauma no recent falls      HISTORY  PMHx:  Past Medical History  Diagnosis Date  . Fibroid   . Morbid obesity   . 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 COPD (chronic obstructive pulmonary disease)    has not seen a physician in years prior to this weakness recent hospitalization.  Has not had screening for colon cancer breast cancer or cervical cancer since 2012.  Was found to have CIN 3 in 2012 with no further workup that is noted.    PSHx: No past surgical history on file.  Social Hx: History   Social History  . Marital Status: Widowed    Spouse Name: N/A    Number of Children: N/A  . Years of Education: N/A   Social History Main Topics  . Smoking status: Current Every Day Smoker -- 2.00 packs/day for 25 years    Types: Cigarettes  . Smokeless tobacco: Never Used  . Alcohol Use: No  . Drug Use: No  . Sexually Active: No   [name] Topics Concern  . Not on file   Social History Narrative   Lives with husband Anita [name] [phone]; Daughter who would like to make medical decisions is Anita [name] [phone]. Patient in school at [name] for counseling degree.     Family Hx: Family History  Problem Relation Age of Onset  . Cancer Mother  leukemia  . Hypertension Mother   . Diabetes Father   . Heart disease Father     passed away due to heart attack    Allergies: Allergies  Allergen Reactions  . Benzene Rash    Home Medications: Prescriptions prior to admission  Medication Sig Dispense Refill  . albuterol (PROVENTIL HFA;VENTOLIN HFA) 108 (90 BASE) MCG/ACT inhaler Inhale 2 puffs into the lungs every 6 (six) hours as needed for wheezing.  1 Inhaler  2  . aspirin EC 81 MG EC tablet Take 1 tablet (81 mg total) by mouth daily.  30 tablet  3  . atorvastatin (LIPITOR) 40 MG tablet Take 1 tablet (40 mg total) by mouth daily at 6 PM.  30 tablet  3  . calcium citrate (CALCITRATE - DOSED  IN MG ELEMENTAL CALCIUM) 950 MG tablet Take 1 tablet (200 mg of elemental calcium total) by mouth 3 (three) times daily with meals.  90 tablet  3  . levofloxacin (LEVAQUIN) 250 MG tablet Take 1 tablet (250 mg total) by mouth daily.  2 tablet  0  . potassium chloride SA (K-DUR,KLOR-CON) 20 MEQ tablet Take 1 tablet (20 mEq total) by mouth 2 (two) times daily.  60 tablet  6  . tiotropium (SPIRIVA HANDIHALER) 18 MCG inhalation capsule Place 1 capsule (18 mcg total) into inhaler and inhale daily.  30 capsule  3  . torsemide (DEMADEX) 20 MG tablet Take 2 tablets (40 mg total) by mouth 2 (two) times daily.  120 tablet  3    OBJECTIVE  Vitals:    Weight: Wt Readings from Last 3 Encounters:  10/06/12 227 lb 8 oz (103.193 kg)  10/02/12 224 lb 13.9 oz (102 kg)  11/07/11 205 lb 11.2 oz (93.305 kg)    I&Os: Yesterday:   This shift:     PE: GENERAL:  Adult Obese  female. In no discomfort; no respiratory distress. PSYCH: Alert and appropriately interactive; Insight:Lacking   H&N: AT/Anita [name], trachea midline EENT:  MMM, no scleral icterus, EOMi HEART: RRR, S1/S2 heard, no murmur, distant heart sounds LUNGS: poor air movement fungating mass in the press , CTA B, no wheezes, no crackles Abdomen:  Protuberant, + BS, distended but non-rigid, TTP diffusely without focality. RECTAL: good rectal tone, dark stool noted in rectal vault, no masses appreciated EXTREMITIES: Moves all 4 extremities spontaneously, warm well perfused, no edema, bilateral DP and PT pulses 2/4.        LABS: CBC BMET   Recent Labs Lab 10/01/12 0555 10/02/12 0505 10/06/12 1145  WBC 13.8* 14.1* 14.2*  HGB 9.2* 9.7* 5.8*  HCT 31.1* 32.5* 18.3*  PLT 189 202 299    Recent Labs Lab 10/01/12 0555 10/02/12 0505 10/06/12 1145  NA 143 141 141  K 3.0* 3.4* 3.3*  CL 89* 90* 92*  CO2 38* 38* 39*  BUN 97* 90* 68*  CREATININE 1.83* 1.57* 1.56*  GLUCOSE 177* 164* 169*  CALCIUM 6.4* 6.9* 7.9*    Results for [name], [name]  (MRN [id number]) as of 10/07/2012 15:49  09/26/2012 05:18  Iron 124  UIBC 169  TIBC 293  Saturation Ratios 42  Ferritin 963 (H)     URINE STUDIES: None  MICRO: Non  IMAGING: US Renal Port  09/25/2012  *RADIOLOGY REPORT*  Clinical Data: Renal failure.  RENAL/URINARY TRACT ULTRASOUND COMPLETE  Comparison:  None  Findings:  Right Kidney:  11.5 cm in estimated length.  The right kidney shows no evidence of hydronephrosis, significant atrophy or focal lesion.  Left Kidney:  The left kidney was difficult to completely visualize due to overlying bowel gas.  Estimated length was 9.6 cm which may be somewhat foreshortened.  The overall appearance of the kidney is within normal limits without evidence of hydronephrosis or focal lesion.  Bladder:  The bladder is decompressed by a Foley catheter.  Incidental note is made of an enlarged and echogenic right lobe of the liver which extends well inferior to the right kidney.  This likely reflects hepatic steatosis.  The entire liver was not imaged.  IMPRESSION: No evidence of renal obstruction or significant renal atrophy. Shorter measured left kidney may be somewhat foreshortened due to poor visualization.  Incidental note is made of probable hepatomegaly with increased echogenicity suggestive of steatosis.   Original Report Authenticated By: Irish Lack, M.D.    Dg Chest Port 1 View  09/30/2012  *RADIOLOGY REPORT*  Clinical Data: Hypoxemia  PORTABLE CHEST - 1 VIEW  Comparison: Portable chest x-ray of 09/27/2012  Findings: Aeration of the lung bases has improved minimally.  There is still airspace disease at the lung bases left greater than right most consistent with pneumonia and possible effusions. Cardiomegaly is stable.  No skeletal abnormality is seen.  IMPRESSION:  Minimally improved aeration.  Persistent basilar opacities left greater than right most consistent with pneumonia.   Original Report Authenticated By: Dwyane Dee, M.D.    Dg Chest Port 1  View  09/27/2012  *RADIOLOGY REPORT*  Clinical Data: Respiratory failure  PORTABLE CHEST - 1 VIEW  Comparison: Prior chest x-ray 09/25/2012  Findings: No significant interval change in the appearance of the lungs.  Persistent low inspiratory volumes with bibasilar effusions and associated bibasilar opacities.  Stable cardiomegaly.  Mild pulmonary vascular congestion without overt edema.  IMPRESSION: No significant change in the appearance of the chest with persistent low inspiratory volumes, bilateral pleural effusions and bibasilar opacities reflecting pleural fluid combined with atelectasis versus consolidation.   Original Report Authenticated By: Malachy Moan, M.D.    Portable Chest 1 View  09/25/2012  *RADIOLOGY REPORT*  Clinical Data: Shortness of breath.  Respiratory distress.  PORTABLE CHEST - 1 VIEW  Comparison: Chest 09/24/2012.  Findings: Bibasilar airspace disease has progressed.  There is cardiomegaly.  No pneumothorax.  There are likely small bilateral pleural effusions.  IMPRESSION:  1.  Increased bibasilar airspace disease likely due to atelectasis and small effusions. 2.  Cardiomegaly.   Original Report Authenticated By: Holley Dexter, M.D.    Dg Chest Portable 1 View  09/24/2012  *RADIOLOGY REPORT*  Clinical Data: Short of breath  PORTABLE CHEST - 1 VIEW  Comparison: None.  Findings: Normal cardiac silhouette.  Retrocardiac opacity likely representing a hernia.  Ectatic aorta is another consideration. There are low lung volumes.  Mild central venous pulmonary congestion.  IMPRESSION:  1. Cardiomegaly and central venous congestion. 2.  Left retrocardiac opacity likely representing a hiatal hernia or ectatic aorta.   Original Report Authenticated By: Genevive Bi, M.D.    No results found.  2D ECHO 09/25/12:  Grade 2 diastolic dysfunction  EF 55%  Presumed Elevated PA pressure [name] Ventricular Septum but not measurable   Medications:     Assessment & Plan  LOS: [age] y.o.  year old female with asymptomatic but critical anemia [name] her recent NSTEMI and CHF.  # Anemia (severe), normocytic: Was intermittently low during last hospitalization.  Did not require transfusion and as low as 7.8,  9.7 at time of discharge 4 days prior to last  draw.  Iron normal & Elevated Ferritin (not candidate for iron therapy)  Repeat CBC now  Tranfusion if repeat <7.0; consider for less than 8.0 [name] history of recent NSTEMI  Hemoccult collected and Pending  # Concern for Metastatic Process (Hx of CIN 3, Endometrial Mass, Breast Mass, Concern for colonic bleed) Will obtain CT scan with contrast of Chest, Abdomen, Pelvis.  [name] new bleed need to evaluate for extension/mets of endometrial origin.      # CKD Stage 3: Baseline Cr of 1.0 to 1.5.  At baseline.  Will give gentle fluids for IV contrast  # Diastolic CHF (grade 2 diastolic) & Pulmonary Hypertension: good systolic function,   Monitor for volume overload.     # COPD, OSA, OHS, uses home BIPAP and O2 Dependent:  Continue O2   Conitnue Home BiPap  # DM2: Recent A1c of 6.6.  Needs further workup as OP.  Not candidate for Metformin due to renal function  SSI  --- FEN  *NS @ 50 -Heart Health --- PPx: SCDs (Pharmacologic contraindicated due to hemoglobin level)    Disposition  Pt to be admitted to floor by [facility].  Repeat CBC, Hemocult, CT Chest, Abdomen Pelvis Pending.  Transfuse for Hb <7 consider <8.  Gentle IVFs.  Dispo pending further workup   Andrena Mews, DO [facility] Resident - PGY-1 10/07/2012 2:50 PM

## 2012-10-08 ENCOUNTER — Inpatient Hospital Stay (HOSPITAL_COMMUNITY): Payer: Medicare Other

## 2012-10-08 ENCOUNTER — Encounter (HOSPITAL_COMMUNITY): Payer: Self-pay | Admitting: Radiology

## 2012-10-08 DIAGNOSIS — I2789 Other specified pulmonary heart diseases: Secondary | ICD-10-CM

## 2012-10-08 DIAGNOSIS — E662 Morbid (severe) obesity with alveolar hypoventilation: Secondary | ICD-10-CM

## 2012-10-08 DIAGNOSIS — F17200 Nicotine dependence, unspecified, uncomplicated: Secondary | ICD-10-CM

## 2012-10-08 DIAGNOSIS — J9610 Chronic respiratory failure, unspecified whether with hypoxia or hypercapnia: Secondary | ICD-10-CM

## 2012-10-08 LAB — BLOOD GAS, ARTERIAL
Bicarbonate: 33.7 mEq/L — ABNORMAL HIGH (ref 20.0–24.0)
O2 Saturation: 95.7 %
Patient temperature: 98.6
TCO2: 35.4 mmol/L (ref 0–100)
pH, Arterial: 7.413 (ref 7.350–7.450)

## 2012-10-08 LAB — CBC
HCT: 19.7 % — ABNORMAL LOW (ref 36.0–46.0)
Hemoglobin: 6.4 g/dL — CL (ref 12.0–15.0)
MCH: 27.6 pg (ref 26.0–34.0)
MCH: 28 pg (ref 26.0–34.0)
MCHC: 32.5 g/dL (ref 30.0–36.0)
MCHC: 33 g/dL (ref 30.0–36.0)
MCV: 84.8 fL (ref 78.0–100.0)
Platelets: 222 10*3/uL (ref 150–400)
RBC: 2.32 MIL/uL — ABNORMAL LOW (ref 3.87–5.11)
RDW: 19.3 % — ABNORMAL HIGH (ref 11.5–15.5)

## 2012-10-08 LAB — URINALYSIS, ROUTINE W REFLEX MICROSCOPIC
Ketones, ur: NEGATIVE mg/dL
Nitrite: NEGATIVE
Protein, ur: NEGATIVE mg/dL
Urobilinogen, UA: 0.2 mg/dL (ref 0.0–1.0)

## 2012-10-08 LAB — BASIC METABOLIC PANEL
BUN: 54 mg/dL — ABNORMAL HIGH (ref 6–23)
Chloride: 94 mEq/L — ABNORMAL LOW (ref 96–112)
GFR calc non Af Amer: 35 mL/min — ABNORMAL LOW (ref >90)
Glucose, Bld: 181 mg/dL — ABNORMAL HIGH (ref 70–99)
Potassium: 3 mEq/L — ABNORMAL LOW (ref 3.5–5.1)
Sodium: 138 mEq/L (ref 135–145)

## 2012-10-08 LAB — URINE MICROSCOPIC-ADD ON

## 2012-10-08 LAB — PREPARE RBC (CROSSMATCH)

## 2012-10-08 LAB — MAGNESIUM: Magnesium: 1.9 mg/dL (ref 1.5–2.5)

## 2012-10-08 MED ORDER — IOHEXOL 300 MG/ML  SOLN
100.0000 mL | Freq: Once | INTRAMUSCULAR | Status: AC | PRN
  Administered 2012-10-08: 100 mL via INTRAVENOUS

## 2012-10-08 MED ORDER — POTASSIUM CHLORIDE CRYS ER 20 MEQ PO TBCR
40.0000 meq | EXTENDED_RELEASE_TABLET | ORAL | Status: DC
  Administered 2012-10-08: 40 meq via ORAL

## 2012-10-08 MED ORDER — DIPHENHYDRAMINE HCL 50 MG PO CAPS
50.0000 mg | ORAL_CAPSULE | Freq: Once | ORAL | Status: AC
  Administered 2012-10-08: 50 mg via ORAL
  Filled 2012-10-08: qty 1

## 2012-10-08 MED ORDER — POTASSIUM CHLORIDE CRYS ER 20 MEQ PO TBCR
40.0000 meq | EXTENDED_RELEASE_TABLET | Freq: Once | ORAL | Status: AC
  Administered 2012-10-08: 40 meq via ORAL
  Filled 2012-10-08: qty 2

## 2012-10-08 MED ORDER — POTASSIUM CHLORIDE CRYS ER 20 MEQ PO TBCR
40.0000 meq | EXTENDED_RELEASE_TABLET | ORAL | Status: AC
  Administered 2012-10-08: 40 meq via ORAL
  Filled 2012-10-08: qty 2

## 2012-10-08 MED ORDER — POTASSIUM CHLORIDE CRYS ER 20 MEQ PO TBCR
40.0000 meq | EXTENDED_RELEASE_TABLET | Freq: Two times a day (BID) | ORAL | Status: DC
  Filled 2012-10-08: qty 2

## 2012-10-08 MED ORDER — PREDNISONE 50 MG PO TABS
50.0000 mg | ORAL_TABLET | Freq: Once | ORAL | Status: AC
  Administered 2012-10-08: 50 mg via ORAL
  Filled 2012-10-08: qty 1

## 2012-10-08 NOTE — Progress Notes (Addendum)
CRITICAL VALUE ALERT  Critical value received:  Hgb 5.4  Date of notification:  10/07/12  Time of notification:  1700  Critical value read back:yes  Nurse who received alert:  Dory Horn, RN   MD notified (1st page):  Gaspar Bidding, DO  Time of first page:  1730  MD notified (2nd page):  Time of second page:  Responding MD:  Gaspar Bidding, DO  Time MD responded:  [phone]

## 2012-10-08 NOTE — Progress Notes (Signed)
PCP Note:  Patient seen at bedside.  Patient is doing okay and notes that her SOB is stable.  She did report feeling cold this am.  I greatly appreciate the care provided by the inpatient FM service.  If I can be of any assistance please don't hesitate to page [phone].

## 2012-10-08 NOTE — Consult Note (Signed)
WOC consult Note Reason for Consult: Pt familiar to [facility] team from previous admission.  She had multiple abrasions and scratches to legs at that time.  Leg appearance remains unchanged; right leg now with stasis ulcer. Wound type:  Left ankle with linear fissure in skin fold, red, partial thickness with minimal drainage. Right calf with partial thickness .2X.2cm, 100% yellow moist wound bed with mod yellow drainage. Multiple scabbed scratches and abrasions scattered to leg areas. Dressing procedure/placement/frequency: Foam dressing to protect and promote healing. Will not plan to follow further unless re-consulted.  [address], RN, MSN, [employer]  [phone]

## 2012-10-08 NOTE — Consult Note (Signed)
[facility] Consult Note  Referring Provider: No ref. provider found Primary Care Physician:  Everlene Other, DO Primary Gastroenterologist:  Dr.  Antony Contras Complaint: Shortness of breath HPI: Anita [name] is an 67 y.o. white female  admitted with an incidentally low hemoglobin of 5.8 as well as general fatigue. She was found to be heme positive on admission. The patient states she's had a colonoscopy in the past she does not member how long that was probably no more than 5 years ago. She thinks she had polyps. She denies any anorexia nausea vomiting early satiety change in bowel habits dysphagia reflux symptoms abdominal pain rectal bleeding melena or change in bowel habits. She does not use nonsteroidal anti-inflammatory drugs.  Past Medical History  Diagnosis Date  . Fibroid   . Morbid obesity   . 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 COPD (chronic obstructive pulmonary disease)     No past surgical history on file.  Medications Prior to Admission  Medication Sig Dispense Refill  . albuterol (PROVENTIL HFA;VENTOLIN HFA) 108 (90 BASE) MCG/ACT inhaler Inhale 1 puff into the lungs 2 (two) times daily as needed for wheezing or shortness of breath.      [name] aspirin EC 81 MG EC tablet Take 1 tablet (81 mg total) by mouth daily.  30 tablet  3  . atorvastatin (LIPITOR) 40 MG tablet Take 1 tablet (40 mg total) by mouth daily at 6 PM.  30 tablet  3  . Calcium Citrate (CALCITRATE PO) Take 1 tablet by mouth daily.      [name] torsemide (DEMADEX) 20 MG tablet Take 2 tablets (40 mg total) by mouth 2 (two) times daily.  120 tablet  3    Allergies:  Allergies  Allergen Reactions  . Omnipaque (Iohexol)     Pt claims she developed hives after given contrast  . Benzene Rash    Family History  Problem Relation Age of Onset  . Cancer Mother     leukemia  . Hypertension Mother   . Diabetes Father   . Heart disease Father     passed away due to heart  attack    Social History:  reports that she has been smoking Cigarettes.  She has a 50 pack-year smoking history. She has never used smokeless tobacco. She reports that she does not drink alcohol or use illicit drugs.  Review of Systems: negative except as above   Blood pressure 119/50, pulse 74, temperature 98.5 F (36.9 C), temperature source Oral, resp. rate 18, weight 102.15 kg (225 lb 3.2 oz), SpO2 94.00%. Head: Normocephalic, without obvious abnormality, atraumatic Neck: no adenopathy, no carotid bruit, no JVD, supple, symmetrical, trachea midline and thyroid not enlarged, symmetric, no tenderness/mass/nodules Resp: clear to auscultation bilaterally Cardio: regular rate and rhythm, S1, S2 normal, no murmur, click, rub or gallop GI: Abdomen soft distended with normoactive bowel sounds. No hepatomegaly masses or guarding. Extremities: extremities normal, atraumatic, no cyanosis or edema  Results for orders placed during the hospital encounter of 10/07/12 (from the past 48 hour(s))  CBC     Status: Abnormal   Collection Time    10/07/12  4:20 PM      Result Value Range   WBC 11.8 (*) 4.0 - 10.5 K/uL   RBC 2.16 (*) 3.87 - 5.11 MIL/uL   Hemoglobin 5.4 (*) 12.0 - 15.0 g/dL   Comment: SPECIMEN CHECKED FOR CLOTS     REPEATED TO VERIFY  CRITICAL RESULT CALLED TO, READ BACK BY AND VERIFIED WITH:     S PRICE,RN 1700 10/07/12 WBOND   HCT 18.2 (*) 36.0 - 46.0 %   MCV 84.3  78.0 - 100.0 fL   MCH 25.0 (*) 26.0 - 34.0 pg   MCHC 29.7 (*) 30.0 - 36.0 g/dL   RDW 16.1 (*) 09.6 - 04.5 %   Platelets 308  150 - 400 K/uL  COMPREHENSIVE METABOLIC PANEL     Status: Abnormal   Collection Time    10/07/12  4:20 PM      Result Value Range   Sodium 141  135 - 145 mEq/L   Potassium 3.0 (*) 3.5 - 5.1 mEq/L   Chloride 93 (*) 96 - 112 mEq/L   CO2 38 (*) 19 - 32 mEq/L   Glucose, Bld 146 (*) 70 - 99 mg/dL   BUN 61 (*) 6 - 23 mg/dL   Creatinine, Ser 4.09 (*) 0.50 - 1.10 mg/dL   Calcium 8.6  8.4 -  81.1 mg/dL   Total Protein 6.7  6.0 - 8.3 g/dL   Albumin 3.1 (*) 3.5 - 5.2 g/dL   AST 17  0 - 37 U/L   ALT 21  0 - 35 U/L   Alkaline Phosphatase 109  39 - 117 U/L   Total Bilirubin 0.4  0.3 - 1.2 mg/dL   GFR calc non Af Amer 35 (*) >90 mL/min   GFR calc Af Amer 40 (*) >90 mL/min   Comment:            The eGFR has been calculated     using the CKD EPI equation.     This calculation has not been     validated in all clinical     situations.     eGFR's persistently     <90 mL/min signify     possible Chronic Kidney Disease.  OCCULT BLOOD X 1 CARD TO LAB, STOOL     Status: Abnormal   Collection Time    10/07/12  4:45 PM      Result Value Range   Fecal Occult Bld POSITIVE (*) NEGATIVE  TYPE AND SCREEN     Status: None   Collection Time    10/07/12  8:07 PM      Result Value Range   ABO/RH(D) A POS     Antibody Screen NEG     Sample Expiration 10/10/2012     Unit Number [id number]     Blood Component Type RED CELLS,LR     Unit division 00     Status of Unit ISSUED,FINAL     Transfusion Status OK TO TRANSFUSE     Crossmatch Result Compatible     Unit Number [id number]     Blood Component Type RED CELLS,LR     Unit division 00     Status of Unit ISSUED     Transfusion Status OK TO TRANSFUSE     Crossmatch Result Compatible     Unit Number [id number]     Blood Component Type RED CELLS,LR     Unit division 00     Status of Unit ISSUED     Transfusion Status OK TO TRANSFUSE     Crossmatch Result Compatible     Unit Number [id number]     Blood Component Type RED CELLS,LR     Unit division 00     Status of Unit ISSUED     Transfusion Status OK TO TRANSFUSE  Crossmatch Result Compatible    PREPARE RBC (CROSSMATCH)     Status: None   Collection Time    10/07/12  8:07 PM      Result Value Range   Order Confirmation ORDER PROCESSED BY BLOOD BANK    ABO/RH     Status: None   Collection Time    10/07/12  8:07 PM      Result Value Range   ABO/RH(D) A POS     URINALYSIS, ROUTINE W REFLEX MICROSCOPIC     Status: Abnormal   Collection Time    10/08/12  3:45 AM      Result Value Range   Color, Urine YELLOW  YELLOW   APPearance CLOUDY (*) CLEAR   Specific Gravity, Urine 1.010  1.005 - 1.030   pH 6.0  5.0 - 8.0   Glucose, UA NEGATIVE  NEGATIVE mg/dL   Hgb urine dipstick SMALL (*) NEGATIVE   Bilirubin Urine NEGATIVE  NEGATIVE   Ketones, ur NEGATIVE  NEGATIVE mg/dL   Protein, ur NEGATIVE  NEGATIVE mg/dL   Urobilinogen, UA 0.2  0.0 - 1.0 mg/dL   Nitrite NEGATIVE  NEGATIVE   Leukocytes, UA MODERATE (*) NEGATIVE  URINE MICROSCOPIC-ADD ON     Status: Abnormal   Collection Time    10/08/12  3:45 AM      Result Value Range   Squamous Epithelial / LPF RARE  RARE   WBC, UA 11-20  <3 WBC/hpf   RBC / HPF 3-6  <3 RBC/hpf   Bacteria, UA FEW (*) RARE  BASIC METABOLIC PANEL     Status: Abnormal   Collection Time    10/08/12  5:00 AM      Result Value Range   Sodium 138  135 - 145 mEq/L   Potassium 3.0 (*) 3.5 - 5.1 mEq/L   Chloride 94 (*) 96 - 112 mEq/L   CO2 37 (*) 19 - 32 mEq/L   Glucose, Bld 181 (*) 70 - 99 mg/dL   BUN 54 (*) 6 - 23 mg/dL   Creatinine, Ser 1.61 (*) 0.50 - 1.10 mg/dL   Calcium 8.2 (*) 8.4 - 10.5 mg/dL   GFR calc non Af Amer 35 (*) >90 mL/min   GFR calc Af Amer 41 (*) >90 mL/min   Comment:            The eGFR has been calculated     using the CKD EPI equation.     This calculation has not been     validated in all clinical     situations.     eGFR's persistently     <90 mL/min signify     possible Chronic Kidney Disease.  CBC     Status: Abnormal   Collection Time    10/08/12  5:00 AM      Result Value Range   WBC 9.2  4.0 - 10.5 K/uL   RBC 2.32 (*) 3.87 - 5.11 MIL/uL   Hemoglobin 6.4 (*) 12.0 - 15.0 g/dL   Comment: POST TRANSFUSION SPECIMEN     REPEATED TO VERIFY     CRITICAL VALUE NOTED.  VALUE IS CONSISTENT WITH PREVIOUSLY REPORTED AND CALLED VALUE.   HCT 19.7 (*) 36.0 - 46.0 %   MCV 84.9  78.0 - 100.0 fL   MCH  27.6  26.0 - 34.0 pg   MCHC 32.5  30.0 - 36.0 g/dL   RDW 09.6 (*) 04.5 - 40.9 %   Platelets 226  150 - 400 K/uL  Comment: DELTA CHECK NOTED     REPEATED TO VERIFY  MAGNESIUM     Status: None   Collection Time    10/08/12  5:00 AM      Result Value Range   Magnesium 1.9  1.5 - 2.5 mg/dL  PREPARE RBC (CROSSMATCH)     Status: None   Collection Time    10/08/12  7:00 AM      Result Value Range   Order Confirmation ORDER PROCESSED BY BLOOD BANK    BLOOD GAS, ARTERIAL     Status: Abnormal   Collection Time    10/08/12 10:30 AM      Result Value Range   O2 Content 2.0     Delivery systems NASAL CANNULA     pH, Arterial 7.413  7.350 - 7.450   pCO2 arterial 53.9 (*) 35.0 - 45.0 mmHg   pO2, Arterial 70.1 (*) 80.0 - 100.0 mmHg   Bicarbonate 33.7 (*) 20.0 - 24.0 mEq/L   TCO2 35.4  0 - 100 mmol/L   Acid-Base Excess 8.9 (*) 0.0 - 2.0 mmol/L   O2 Saturation 95.7     Patient temperature 98.6     Collection site RIGHT BRACHIAL     Drawn by [id number]     No results found.  Assessment: Severe microcytic anemia with heme positive stools Plan:  Will at least need colonoscopy and possible EGD. CT scan is ordered and this may help guide need for endoscopic studies. The patient did not appear particularly receptive to doing these procedures, saying "I don't trust anybody" I will discuss with her further after CT scan results back. Agree with transfusion. Follow with you. Could potentially do the procedures the day after tomorrow. Ysidra Sopher C 10/08/2012, 12:56 PM

## 2012-10-08 NOTE — Progress Notes (Signed)
Placed CPAP in pt.'s room. Pt. Is unsure if she would like to wear CPAP. Pt. was made aware to call if she decided to wear it. RT explained to pt. the importance of wearing CPAP. RN is aware of this.

## 2012-10-08 NOTE — Progress Notes (Signed)
Utilization Review Completed.Christoher Drudge T2/07/2013  

## 2012-10-08 NOTE — Care Management Note (Unsigned)
    Page 1 of 1   10/08/2012     1:17:51 PM   CARE MANAGEMENT NOTE 10/08/2012  Patient:  Anita [name], Anita [name]   Account Number:  [id number]  Date Initiated:  10/08/2012  Documentation initiated by:  Jenese Mischke  Subjective/Objective Assessment:   PT WITH ABD MASS ADM WITH ANEMIA.  PTA, PT RESIDES AT HOME WITH HUSBAND.     Action/Plan:   RECEIVING BLOOD TODAY; WILL FOLLOW FOR HOME NEEDS AS PT PROGRESSES.   Anticipated DC Date:  10/12/2012   Anticipated DC Plan:  HOME W HOME HEALTH SERVICES      DC Planning Services  CM consult      Choice offered to / List presented to:             Status of service:  In process, will continue to follow Medicare Important Message given?   (If response is "NO", the following Medicare IM given date fields will be blank) Date Medicare IM given:   Date Additional Medicare IM given:    Discharge Disposition:    Per UR Regulation:  Reviewed for med. necessity/level of care/duration of stay  If discussed at Long Length of Stay Meetings, dates discussed:    Comments:

## 2012-10-08 NOTE — Progress Notes (Signed)
PATIENT FEELS LIKE HAS TO VOID BUT ONLY VDING. SM. FREQ. AMTS. BLADDER SCAN 710 ML R.N. AWARE.DR RIGBY AWARE SEE ORDERS FOR FOLEY CATH. AND URINE FOR UA AND CUL WHICH WAS SENT. PATIENT TOL WELL FUNKE R.N. TRIED X ONE W/ 14 F FOLEY UNSCU. I TRIED X1 W/ SUCCESS W/ 14 F FOLEY. 700 ML CLR YELLOW URINE OBTAIN

## 2012-10-08 NOTE — Progress Notes (Signed)
The patient was seen and had a MMSE performed today administered by medical student Ronnette Juniper MSlV at 12:01 PM, 10/08/2012.  Results of MMSE:  Orientation 10/10 Registration: 3/3 Attention and calculation: 5/5 Recall: 3/3 Language naming and repeating: 3/3 Reading writing: 2/2 3 stage command: 3/3 Construction: 1/1  Score: 30/30 Results: Normal  Ronnette Juniper MSlV Family Medicine  PGY3 Addendum to student note: Agree with above findings.   Johaan Ryser

## 2012-10-08 NOTE — Progress Notes (Addendum)
Family Medicine Teaching Service Daily Progress Note Service Page: [phone]  Patient Assessment: 67 yo F w/ complicated PMHx including recent admit 09/24/2012 - 10/02/2012 for acute diastolic CHF exacerbation and COPD exacerbation, NSTEMI, anemia, breast mass, leg ulcers, fiborma, CIN lll and electrolyte abnormalities presents with low Hg of 5.4 g/dL from clinic.  Subjective: The patient reports she is feeling about the same as she was yesterday and and feeling fatigued. She says she didn't sleep very well last night because of the people that kept coming in her room and because she was told to sleep on her back and she sleeps better on her side. The patient ate a breakfast this morning and reports she did not have any associated nausea. Positive for orthopnea, fatigue, SOB x 1 month. She denies CP, N/V/D, pain, fevers, leg edema, headaches or weight changes.  Objective: Temp:  [97.3 F (36.3 C)-98.5 F (36.9 C)] 97.8 F (36.6 C) (02/12 0347) Pulse Rate:  [72-92] 76 (02/12 0347) Resp:  [16-24] 20 (02/12 0347) BP: (101-120)/(53-64) 115/57 mmHg (02/12 0331) SpO2:  [96 %-100 %] 100 % (02/12 0347) Weight:  [225 lb 3.2 oz (102.15 kg)] 225 lb 3.2 oz (102.15 kg) (02/12 0347)  Exam: General: Somnolent, nasal cannula present @ 4 LPM O2, laying on L side with 30 degree incline on bed and 2 pillows, obese, pleasant. Cardiovascular: RRR, no rubs, murmurs, gallops on auscultation with isolation stethoscope. Respiratory: Clear breath sounds bilaterally on auscultation with isolation stethoscope. Abdomen: +BS, diffuse tenderness on moderate/deep palpation. Extremities: LL edema bilaterally 2+ pitting. Lower leg ulcers bilaterally, 1 ulcer covered on L leg, no current weeping or evidence of active cellulitis. Neuro: CN 2-12 grossly intact.   I have reviewed the patient's medications, labs, imaging, and diagnostic testing.  Notable results are summarized below.  Medications:  Scheduled Meds: Lipitor 40  mg PO q1800 Calcium Citrate 200 m tab PO TID Protonix 80 mg/100 mL IVPB PO BID  PRN Meds: Albuterol 2 puff IN q6h PRN  IVF: 500 mL/hr IV NS  Labs:  CBC  Recent Labs Lab 10/06/12 1145 10/07/12 1620 10/08/12 0500  WBC 14.2* 11.8* 9.2  HGB 5.8* 5.4* 6.4*  HCT 18.3* 18.2* 19.7*  PLT 299 308 226    BMET  Recent Labs Lab 10/06/12 1145 10/07/12 1620 10/08/12 0500  NA 141 141 138  K 3.3* 3.0* 3.0*  CL 92* 93* 94*  CO2 39* 38* 37*  BUN 68* 61* 54*  CREATININE 1.56* 1.52* 1.50*  GLUCOSE 169* 146* 181*  CALCIUM 7.9* 8.6 8.2*    10/08/2012 05:00  Magnesium 1.9     10/08/2012 03:45  Color, Urine YELLOW  APPearance CLOUDY (A)  Specific Gravity, Urine 1.010  pH 6.0  Glucose NEGATIVE  Bilirubin Urine NEGATIVE  Ketones, ur NEGATIVE  Protein NEGATIVE  Urobilinogen, UA 0.2  Nitrite NEGATIVE  Leukocytes, UA MODERATE (A)  Hgb urine dipstick SMALL (A)  WBC, UA 11-20  RBC / HPF 3-6  Squamous Epithelial / LPF RARE  Bacteria, UA FEW (A)    Imaging/Diagnostic Tests:  Renal/urinary tract [place] from previous admit- 09/25/2012 IMPRESSION:  No evidence of renal obstruction or significant renal atrophy.  Shorter measured left kidney may be somewhat foreshortened due to  poor visualization. Incidental note is made of probable  hepatomegaly with increased echogenicity suggestive of steatosis.   CXR from previous admit- 09/30/2012 PORTABLE CHEST - 1 VIEW  Comparison: Portable chest x-ray of 09/27/2012  Findings: Aeration of the lung bases has improved minimally. There  is still airspace disease at the lung bases left greater than right  most consistent with pneumonia and possible effusions.  Cardiomegaly is stable. No skeletal abnormality is seen.   IMPRESSION:  Minimally improved aeration. Persistent basilar opacities left  greater than right most consistent with pneumonia.   Assessment/Plan:  # Anemia (severe), normocytic w/ Melana:  Melana x 3 days at home. No  hematochezia. Hemoccult +.  Hg was intermittently low during last hospitalization from from 09/24/2012 - 10/02/2012 w/o tranfusion- 7.8 (2/1) on admit and  9.7 (2/6) on discharge 4 days prior to last draw. Pt admit with Hb 5.4, s/p 2 U blood --> Hb 6.4. Pt is to receive 2 more units of blood later today. Iron on 09/26/2012- 124 ug/dL. Ferritin elevated on 09/26/2012- 963 ng/mL. Tranfusion if Hg <7.0; consider for less than 8.0 given history of recent NSTEMI.  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 Given new bleed need to evaluate for extension/mets of endometrial origin. Hx hives to IV contrast x 12 years ago, spoke with IR and may get by w/ IV infusion post today's transfusion with Benadryl and 1 dose steroid prior. R breast mass- 7 x 9 cm found 09/26/2011 during bathing by nurses. Never had a colonoscopy due to refusing of procedure. Hx CIN lll, irregular pap smears x 30 years. pt refused sx. Hx Fibroid.  # CKD Stage 3:  Baseline Cr of 1.0 to 1.5. At baseline. Will give gentle fluids for IV contrast.  Cr 1.5 mg/dL on admit.  #. Hypokalemia 3.0 mEq/L on admit. Mg 1.9 mg/dL. Hx Hypokalemia. Will replenish potassium, low dose and monitor due to CKD stage lll.  # Diastolic CHF (grade 2 diastolic) & Pulmonary Hypertension: good systolic function,  Monitor for volume overload.   # COPD, OSA, OHS, uses home BIPAP and O2 Dependent: Continue O2 PRN Conitnue Home BiPap   # DM2:  Recent A1c of 6.6. Needs further workup as OP. Not candidate for Metformin due to renal function  SSI   #. Urine analysis- 10/08/2012 Ph- 6.0. Moderate leukocytes, small amoutn of Hgb, 11-20 WBC and few bacteria. Urine culture sent and pending.  #. 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   DVT Prophy: SCDs.(pharm contraindicated due to Hg conc.)   The patient was discussed with and seen/examined by Dr. Lula Olszewski and Dr. Jennette Kettle.  Ronnette Juniper [facility] Family Medicine  PGY3 Addendum to Student Note: I saw and evaluated this patient and agree with the above plan.  Briefly:   S: Sleeping, wakes to voice, denies complaints  O:  BP 115/57  Pulse 76  Temp(Src) 97.8 F (36.6 C) (Oral)  Resp 20  Wt 225 lb 3.2 oz (102.15 kg)  BMI 43.24 kg/m2  SpO2 100% General appearance: alert, cooperative and no distress Lungs: clear to auscultation bilaterally Heart: regular rate and rhythm, S1, S2 normal, no murmur, click, rub or gallop Abdomen: soft, non-tender; bowel sounds normal; no masses,  no organomegaly Extremities: extremities normal, atraumatic, no cyanosis or edema Pulses: 2+ and symmetric  A/P: 67 year old female admitted for significant hemoglobin drop (5.8 in clinc on 2/10, 9.7 in hospital on 2/6), with heme + stools but no evidence of active bleed on exam, with recent NSTEMI:   #  Anemia: unclear but possible GI loss of blood (heme + stools).  Patient only improved 5.4 -->6.4 with 2 units, will transfuse 2 more units today. Repeat CBC after transfusion.   - Patient with reaction to IV contrast (rash) several years ago, we would like CT chest abdomen and pelvis at some point but feel colonoscopy may be more important at this time.  - Will consult Gastroenterology for possible colonoscopy.   # Concern for Metastatic Process: Patient has not had any recent health maintenance cancer screening - Breast mass, Heme + stools with anemia, and had CIN III years ago that she declined GYN's recommendations for [place].  - Will plan on CT Chest/Abdomen/Pelvis at some point when she is more stable.   # CKD: stable, but will continue IVF in setting of plan for CT scan with IV contrast.   # Pulm: OHS/COPD:  - Bipap ordered QHS, patient does not wear, continue [place]  as needed.   # CHF: Diastolic, will monitor fluid status   #DM: SSI  #FEN/GI: NS @ 50 cc/s hr.  Replace K+ PO.  Replace Calcium PO.  Continue monitoring BMET. Carb modified diet.   # DVT PPx: SCD's  # Full Code  # Disposition: pending anemia work up.   Loza Prell 10/08/2012 9:42 AM

## 2012-10-08 NOTE — Progress Notes (Signed)
DR RIGBY AWARE OF ABNL LABS SEE ORDERS. R.N. Eather Colas.

## 2012-10-08 NOTE — Progress Notes (Signed)
DR. Berline Chough ON FLOOR AND AWARE OF LAB RESULT AND AND SPOKE W/ PATIENT FRIEND THAT IS A M.D. PER PATIENT REQUEST.

## 2012-10-09 DIAGNOSIS — R1902 Left upper quadrant abdominal swelling, mass and lump: Secondary | ICD-10-CM

## 2012-10-09 LAB — CBC
HCT: 28.1 % — ABNORMAL LOW (ref 36.0–46.0)
Hemoglobin: 8.9 g/dL — ABNORMAL LOW (ref 12.0–15.0)
MCH: 27.1 pg (ref 26.0–34.0)
MCHC: 31.7 g/dL (ref 30.0–36.0)
MCV: 85.4 fL (ref 78.0–100.0)
RDW: 19.4 % — ABNORMAL HIGH (ref 11.5–15.5)

## 2012-10-09 LAB — TYPE AND SCREEN
ABO/RH(D): A POS
Unit division: 0
Unit division: 0
Unit division: 0

## 2012-10-09 LAB — BASIC METABOLIC PANEL: GFR calc Af Amer: 49 mL/min — ABNORMAL LOW (ref >90)

## 2012-10-09 LAB — URINE CULTURE

## 2012-10-09 NOTE — Progress Notes (Signed)
FMTS Attending Daily Note: Anita Levy MD [phone] pager office [phone] I  have seen and examined this patient, reviewed their chart. I have discussed this patient with the resident. I agree with the resident's findings, assessment and care plan. Discussion with her, her son and two other relatives regarding te Ct results which are very concerning for multiple areas of metastatic disease. Unclear primary although several options (breast mass, pulmonary nodule in smoker,known history of untreated CIN3 cervix). We are asking oncology to help [place] decide how to proceed with further work up.

## 2012-10-09 NOTE — Consult Note (Signed)
[place] [facility]  [phone]     ONCOLOGY  HOSPITAL CONSULTATION NOTE  Anita [name]                                MR#: [id number]  DOB: 1945-09-13                       CSN#: [id number]  Referring MD: Dr. Emmie Niemann [facility]  Primary MD: Ocie Cornfield, DO @ [facility]    Reason for consult : Metastatic lesions  Anita [name]:XBMWUX C Anita [name] is a 67 y.o.  white  female with multiple medical issues  listed below including tobacco abuse and CIN III in 2012, as well as a recent hospitalization for acute respiratory,  kidney failure NSTEMI. During that hospital stay from 1/29-2/6 she had been found per physical exam on 1/30  to have a large lateral breast mass which was to be followed up as outpatient with mammograms versus FNA for tissue diagnosis .   She was readmitted on 10/07/2012 with symptomatic anemia , Hb 5.8 and Heme positive, requiring blood transfusions, total 4 units. CT of the abdomen and pelvis with contrast on 2/12 demonstrated a soft tissue mass or hemorrhages present anterior to the spleen, measuring 2.9 x 4.9 x 3.4 cm.  There is a hypodense area anteriorly in spleen, adjacent to this of  3.5 cm. Mild fatty infiltration of the liver without obvious lesions.  Multiple sclerotic lesions are present throughout the thoracolumbar spine, compatible with metastases. GI service evaluated the patient, recommending either EGD vs. Colonoscopy.  In the interim, a  CT of the Chest with contrast on 10/08/12 revealed a 6 mm ground- glass  Right upper lobe nodule, partial LLL collapse and a diffuse sclerotic pattern suggesting multiple myeloma or metastasis.  We were requested to see this patient with recommendations, anticipating that the patient will need Oncology involvement. Patient is Full Code       PMH:  Past Medical History  Diagnosis Date  . Fibroid   . Morbid obesity   . 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 COPD (chronic obstructive  pulmonary disease) with exacerbation last admission   Breast mass found on 1/30 Severe anemia, normocytic  in setting of chronic disease. BL 11-12 Acute kidney injury dx 05/2012 Diabetes Mellitus H/O NSTEMI Jan 2014 H/O Acute Respiratory Failure Jan 2014 H/O Acute DHF Jan 2014 Bilateral lower extremity ulcers  Surgeries:  No past surgical history on file.  Allergies:  Allergies  Allergen Reactions  . Omnipaque (Iohexol)     Pt claims she developed hives after given contrast  . Benzene Rash    Medications:   Prior to Admission:  Prescriptions prior to admission  Medication Sig Dispense Refill  . albuterol (PROVENTIL HFA;VENTOLIN HFA) 108 (90 BASE) MCG/ACT inhaler Inhale 1 puff into the lungs 2 (two) times daily as needed for wheezing or shortness of breath.      [name] aspirin EC 81 MG EC tablet Take 1 tablet (81 mg total) by mouth daily.  30 tablet  3  . atorvastatin (LIPITOR) 40 MG tablet Take 1 tablet (40 mg total) by mouth daily at 6 PM.  30 tablet  3  . Calcium Citrate (CALCITRATE PO) Take 1 tablet by mouth daily.      [name] torsemide (DEMADEX) 20 MG tablet Take 2 tablets (40 mg  total) by mouth 2 (two) times daily.  120 tablet  3    NWG:NFAOZHYQM  ROS: As per Dr. Gaylyn Rong Constitutional: Positive for weight loss. Negative for fever, chills or  night sweats.  Eyes: Negative for blurred vision and double vision.  Respiratory: Negative for productive cough. No hemoptysis.No hoarseness.No neck swelling.  Positive for shortness of breath on exertion. No pleuritic chest pain.  Cardiovascular: Negative for chest pain. No palpitations.  GI: Negative for  nausea, vomiting, diarrhea or constipation. No change in bowel caliber. No  Melena or hematochezia. No abdominal pain.  GU: Negative for hematuria. No loss of urinary control. No urinary retention. Skin: Negative for itching. No rash. No petechia. No easy  Bruising. Musculoskeletal: Denies back , arm pain.  Neurological: No headaches.No  confusion. No motor or sensory deficits.  Family History:    Family History  Problem Relation Age of Onset  . Cancer Mother     leukemia  . Hypertension Mother   . Diabetes Father   . Heart disease Father     passed away due to heart attack      Social History: She has a 50 pack-year smoking history, continued to smoke until admission. She has never used smokeless tobacco. She reports that she does not drink alcohol or use illicit drugs. .Married to [employer]. 2 sons. Daughter [name] [phone] main [place], may be her POA Patient is studying Counseling  at [employer].   Physical Exam    Filed Vitals:   10/09/12 0517  BP: 115/59  Pulse: 72  Temp: 98 F (36.7 C)  Resp: 16     Filed Weights   10/08/12 0347 10/09/12 0517  Weight: 225 lb 3.2 oz (102.15 kg) 239 lb 3.2 oz (108.5 kg)    As per Dr. Gaylyn Rong General:   -year-old  in no acute distress A. and O. x3  well-developed, well-nourished.  HEENT: Normocephalic, atraumatic, PERRLA. Sclerae anicteric. Oral cavity without thrush or lesions. NECK:supple. no thyromegaly, no cervical or supraclavicular adenopathy  LUNGS: clear bilaterally . No wheezing, rhonchi or rales. No axillary masses. BREASTS: not examined. CARDIOVASCULAR: regular rate and rhythm, no murmur , rubs or gallops ABDOMEN: soft nontender , bowel sounds x4. No HSM. No masses palpable.  GU/rectal: deferred. EXTREMITIES: no clubbing cyanosis or edema. No bruising or petechial rash MUSCULOSKELETAL: no spinal tenderness.  NEURO: Non Focal. No Horner's.   Labs:  CBC   Recent Labs Lab 10/06/12 1145 10/07/12 1620 10/08/12 0500 10/08/12 1625 10/09/12 0655  WBC 14.2* 11.8* 9.2 9.6 11.4*  HGB 5.8* 5.4* 6.4* 9.0* 8.9*  HCT 18.3* 18.2* 19.7* 27.3* 28.1*  PLT 299 308 226 222 215  MCV 80.6 84.3 84.9 84.8 85.4  MCH 25.6* 25.0* 27.6 28.0 27.1  MCHC 31.7 29.7* 32.5 33.0 31.7  RDW 23.5* 24.0* 21.1* 19.3* 19.4*  LYMPHSABS 1.0  --   --   --   --   MONOABS 0.8  --    --   --   --   EOSABS 0.1  --   --   --   --   BASOSABS 0.0  --   --   --   --      CMP    Recent Labs Lab 10/06/12 1145 10/07/12 1620 10/08/12 0500 10/09/12 0655  NA 141 141 138 139  K 3.3* 3.0* 3.0* 4.5  CL 92* 93* 94* 98  CO2 39* 38* 37* 29  GLUCOSE 169* 146* 181* 235*  BUN 68* 61* 54*  32*  CREATININE 1.56* 1.52* 1.50* 1.30*  CALCIUM 7.9* 8.6 8.2* 9.0  MG  --   --  1.9  --   AST  --  17  --   --   ALT  --  21  --   --   ALKPHOS  --  109  --   --   BILITOT  --  0.4  --   --         Component Value Date/Time   BILITOT 0.4 10/07/2012 1620   BILIDIR 0.2 09/30/2012 0600   IBILI 0.3 09/30/2012 0600     No results found for this basename: INR, PROTIME,  in the last 168 hours  No results found for this basename: DDIMER,  in the last 72 hours   Anemia panel: As of 09/26/2012 : Iron 124, TIBC 293, percent sat 42 and Ferritin 963  No results found for this basename: VITAMINB12, FOLATE, FERRITIN, TIBC, IRON, RETICCTPCT,  in the last 72 hours   Imaging Studies:  Ct Chest W Contrast  10/08/2012  *RADIOLOGY REPORT*  Clinical Data:  Hemorrhage.  CT CHEST, ABDOMEN AND PELVIS WITH CONTRAST  Technique:  Multidetector CT imaging of the chest, abdomen and pelvis was performed following the standard protocol during bolus administration of intravenous contrast.  Contrast: OMNIPAQUE IOHEXOL 300 MG/ML  SOLN  Comparison:  Chest x-ray 09/30/2012.  Abdominal ultrasound 09/24/2012.  CT CHEST  Findings:  The heart is mildly enlarged.  Atherosclerotic calcifications are present in the aorta.  Irregularity of the thyroid is present with asymmetric enlargement of the left lobe. No significant mediastinal or axillary adenopathy is present.  A small left pleural effusion is present.  A 6 mm ground-glass nodule is present in the right upper lobe. There is partial collapse of the left lower lobe.  Minimal right basilar atelectasis is present.  The bone windows demonstrate a diffuse sclerotic  pattern suggesting multiple myeloma or metastasis.  IMPRESSION:  1.  6 mm ground-glass nodule in the right upper lobe. Adenocarcinoma cannot be excluded.  Followup by CT is recommended in 12 months, with continued annual surveillance for a minimum of 3 years.  These recommendations are taken from "Recommendations for the Management of Subsolid Pulmonary Nodules Detected at CT:  A Statement from the Fleischner Society" Radiology 2013; 266:1, 304- 317. 2.  Mild cardiomegaly without failure. 3.  Atherosclerosis. 4.  Extensive osseous sclerotic lesions suggesting diffuse metastatic disease. 4.  Small left pleural effusion. 5.  Partial collapse of the left lower lobe. 6.  Mild right basilar atelectasis.  CT ABDOMEN AND PELVIS  Findings:  Mild fatty infiltration is present in the liver.  No focal hepatic lesions are present.  A soft tissue mass or hemorrhages present anterior to the spleen, measuring 2.9 x 4.9 x 3.4 cm.  There is a hypodense area anteriorly in spleen, adjacent to this which measures 3.5 cm.  The stomach, duodenum, and pancreas are within normal limits.  The adrenal glands are normal bilaterally.  A simple cyst posteriorly in the left kidney measures 2.4 cm.  A large staghorn calculus is present within the left kidney.  There is no evidence for obstruction.  No right-sided stones are present.  A large exophytic cyst is noted anteriorly in the right kidney.  Moderate stool is present within the rectosigmoid colon.  The remainder of the colon is unremarkable.  Contrast can be seen to the descending colon.  The remainder the colon is unremarkable. The appendix is visualized and  normal.  Small bowel is within normal limits. Calcifications and exophytic lesions of the uterus are compatible with multiple fibroids.  The adnexa are within normal limits for age.  A Foley catheter is present within the urinary bladder.  Multiple sclerotic lesions are present throughout the thoracolumbar spine, compatible with  metastases.  IMPRESSION:  1.  Extensive sclerotic lesions throughout the thoracolumbar spine are compatible with metastases are less likely myeloma. 2.  Multiple uterine fibroids. 3.  4.9 x 3.4 cm soft tissue mass versus hematoma anterior to the spleen. 4.  3.5 cm hypodense lesion within the anterior spleen could represent a spontaneous hemorrhage versus a metastasis. 5.  Atherosclerosis. 6.  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   Original Report Authenticated By: Marin Roberts, M.D.    Ct Abdomen Pelvis W Contrast  10/08/2012  *RADIOLOGY REPORT*  Clinical Data:  Hemorrhage.  CT CHEST, ABDOMEN AND PELVIS WITH CONTRAST  Technique:  Multidetector CT imaging of the chest, abdomen and pelvis was performed following the standard protocol during bolus administration of intravenous contrast.  Contrast: OMNIPAQUE IOHEXOL 300 MG/ML  SOLN  Comparison:  Chest x-ray 09/30/2012.  Abdominal ultrasound 09/24/2012.  CT CHEST  Findings:  The heart is mildly enlarged.  Atherosclerotic calcifications are present in the aorta.  Irregularity of the thyroid is present with asymmetric enlargement of the left lobe. No significant mediastinal or axillary adenopathy is present.  A small left pleural effusion is present.  A 6 mm ground-glass nodule is present in the right upper lobe. There is partial collapse of the left lower lobe.  Minimal right basilar atelectasis is present.  The bone windows demonstrate a diffuse sclerotic pattern suggesting multiple myeloma or metastasis.  IMPRESSION:  1.  6 mm ground-glass nodule in the right upper lobe. Adenocarcinoma cannot be excluded.  Followup by CT is recommended in 12 months, with continued annual surveillance for a minimum of 3 years.  These recommendations are taken from "Recommendations for the Management of Subsolid Pulmonary Nodules Detected at CT:  A Statement from the Fleischner Society" Radiology 2013; 266:1, 304- 317. 2.  Mild  cardiomegaly without failure. 3.  Atherosclerosis. 4.  Extensive osseous sclerotic lesions suggesting diffuse metastatic disease. 4.  Small left pleural effusion. 5.  Partial collapse of the left lower lobe. 6.  Mild right basilar atelectasis.  CT ABDOMEN AND PELVIS  Findings:  Mild fatty infiltration is present in the liver.  No focal hepatic lesions are present.  A soft tissue mass or hemorrhages present anterior to the spleen, measuring 2.9 x 4.9 x 3.4 cm.  There is a hypodense area anteriorly in spleen, adjacent to this which measures 3.5 cm.  The stomach, duodenum, and pancreas are within normal limits.  The adrenal glands are normal bilaterally.  A simple cyst posteriorly in the left kidney measures 2.4 cm.  A large staghorn calculus is present within the left kidney.  There is no evidence for obstruction.  No right-sided stones are present.  A large exophytic cyst is noted anteriorly in the right kidney.  Moderate stool is present within the rectosigmoid colon.  The remainder of the colon is unremarkable.  Contrast can be seen to the descending colon.  The remainder the colon is unremarkable. The appendix is visualized and normal.  Small bowel is within normal limits. Calcifications and exophytic lesions of the uterus are compatible with multiple fibroids.  The adnexa are within normal limits for age.  A Foley catheter is present within  the urinary bladder.  Multiple sclerotic lesions are present throughout the thoracolumbar spine, compatible with metastases.  IMPRESSION:  1.  Extensive sclerotic lesions throughout the thoracolumbar spine are compatible with metastases are less likely myeloma. 2.  Multiple uterine fibroids. 3.  4.9 x 3.4 cm soft tissue mass versus hematoma anterior to the spleen. 4.  3.5 cm hypodense lesion within the anterior spleen could represent a spontaneous hemorrhage versus a metastasis. 5.  Atherosclerosis. 6.  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    Original Report Authenticated By: Marin Roberts, M.D.    US Renal Port  09/25/2012  *RADIOLOGY REPORT*  Clinical Data: Renal failure.  RENAL/URINARY TRACT ULTRASOUND COMPLETE  Comparison:  None  Findings:  Right Kidney:  11.5 cm in estimated length.  The right kidney shows no evidence of hydronephrosis, significant atrophy or focal lesion.  Left Kidney:  The left kidney was difficult to completely visualize due to overlying bowel gas.  Estimated length was 9.6 cm which may be somewhat foreshortened.  The overall appearance of the kidney is within normal limits without evidence of hydronephrosis or focal lesion.  Bladder:  The bladder is decompressed by a Foley catheter.  Incidental note is made of an enlarged and echogenic right lobe of the liver which extends well inferior to the right kidney.  This likely reflects hepatic steatosis.  The entire liver was not imaged.  IMPRESSION: No evidence of renal obstruction or significant renal atrophy. Shorter measured left kidney may be somewhat foreshortened due to poor visualization.  Incidental note is made of probable hepatomegaly with increased echogenicity suggestive of steatosis.   Original Report Authenticated By: Irish Lack, M.D.    Dg Chest Port 1 View  09/30/2012  *RADIOLOGY REPORT*  Clinical Data: Hypoxemia  PORTABLE CHEST - 1 VIEW  Comparison: Portable chest x-ray of 09/27/2012  Findings: Aeration of the lung bases has improved minimally.  There is still airspace disease at the lung bases left greater than right most consistent with pneumonia and possible effusions. Cardiomegaly is stable.  No skeletal abnormality is seen.  IMPRESSION:  Minimally improved aeration.  Persistent basilar opacities left greater than right most consistent with pneumonia.   Original Report Authenticated By: Dwyane Dee, M.D.    Dg Chest Port 1 View  09/27/2012  *RADIOLOGY REPORT*  Clinical Data: Respiratory failure  PORTABLE CHEST - 1 VIEW  Comparison: Prior chest  x-ray 09/25/2012  Findings: No significant interval change in the appearance of the lungs.  Persistent low inspiratory volumes with bibasilar effusions and associated bibasilar opacities.  Stable cardiomegaly.  Mild pulmonary vascular congestion without overt edema.  IMPRESSION: No significant change in the appearance of the chest with persistent low inspiratory volumes, bilateral pleural effusions and bibasilar opacities reflecting pleural fluid combined with atelectasis versus consolidation.   Original Report Authenticated By: Malachy Moan, M.D.    Portable Chest 1 View  09/25/2012  *RADIOLOGY REPORT*  Clinical Data: Shortness of breath.  Respiratory distress.  PORTABLE CHEST - 1 VIEW  Comparison: Chest 09/24/2012.  Findings: Bibasilar airspace disease has progressed.  There is cardiomegaly.  No pneumothorax.  There are likely small bilateral pleural effusions.  IMPRESSION:  1.  Increased bibasilar airspace disease likely due to atelectasis and small effusions. 2.  Cardiomegaly.   Original Report Authenticated By: Holley Dexter, M.D.    Dg Chest Portable 1 View  09/24/2012  *RADIOLOGY REPORT*  Clinical Data: Short of breath  PORTABLE CHEST - 1 VIEW  Comparison: None.  Findings: Normal cardiac silhouette.  Retrocardiac opacity likely representing a hernia.  Ectatic aorta is another consideration. There are low lung volumes.  Mild central venous pulmonary congestion.  IMPRESSION:  1. Cardiomegaly and central venous congestion. 2.  Left retrocardiac opacity likely representing a hiatal hernia or ectatic aorta.   Original Report Authenticated By: Genevive Bi, M.D.      Marlowe Kays E 10/09/2012 10:29 AM   ============================  Attending's Addendum.  I personally reviewed her chart, visited with patient, examined her, reviewed CT scan's and discussed with patient and her daughter the following plan.   PROBLEM LIST:  1.  Right lower outer quadrant breast mass. 2.  Diffuse lytic  bone lesions. 3.  Chronic anemia, heme occult positive but no sign of iron deficiency. 4.  Morbid obesity. 5.  Left upper quadrant mass (parasplenic), symptomatic with pain 6.  Deconditioning.  7.  Grade 2 dystolic dysfunction. 8.  Diabetes mellitus, type II.  9.  COPD.  10.  History of abnormal PAP smear.  11.  Chronic renal insufficiency.    IMPRESSION:  It's difficult to have one unifying diagnosis here.  Metastatic breast cancer can present as bone met and also cause anemia of chronic disease.  Rarely, lobular breast cancer can involve GI tract causing GI bleed.  However, this does not explain the parasplenic mass.  Could she also have myeloma which can explain the anemia, renal insufficiency, and plasma cytomas (presenting in the breast mass and LUQ).  Lower down on the list GI cancer but will be more difficult to explain the breast mass and lytic lesions.   RECOMMENDATIONS:  - IR for bone lesion biopsy due tomorrow.  - I talked with Dr. Ovidio Kin (Gen Surg) who graciously agreed to see patient soon to do bedside core biopsy of the breast mass.  Outpatient mammogram and biopsy takes too long.  (Even if bone bx shows breast cancer, breast mass biopsy is still needed since ER/PR/Her2-neu cannot be performed on a decalcified bone biopsy sample). - I requested [place] of LUQ to see whether the mass is cystic/hemorrhagic vs. Solid.  If it's solid, I would recommend [place] or CT guided biopsy.  - Continue to follow daily CBC and transfuse for Hgb <7 or severe symptomatic anemia if hgb >7.  - Patient is not enthused about inpatient EGD/colonoscopy.  In my personal opinion, endoscopic work up can be delayed for outpatient unless she has active GI bleeding or not sustaining her Hgb despite transfusion.  - I will follow up with patient (outpatient) once pathology is available.  As soon as she has bone bx, breast bx, she does not need to remain inpatient from Oncology standpoint.      Thank you for  this consultation.

## 2012-10-09 NOTE — H&P (Signed)
Anita [name] is an 67 y.o. female.   Chief Complaint: Request for tissue biopsy HPI: 67 yo with reportedly a breast lesion on exam.  Recent CT demonstrated multiple bone lesions that are suspicious for metastatic disease.  Past Medical History  Diagnosis Date  . Fibroid   . Morbid obesity   . 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 COPD (chronic obstructive pulmonary disease)     No past surgical history on file.  Family History  Problem Relation Age of Onset  . Cancer Mother     leukemia  . Hypertension Mother   . Diabetes Father   . Heart disease Father     passed away due to heart attack   Social History:  reports that she has been smoking Cigarettes.  She has a 50 pack-year smoking history. She has never used smokeless tobacco. She reports that she does not drink alcohol or use illicit drugs.  Allergies:  Allergies  Allergen Reactions  . Omnipaque (Iohexol)     Pt claims she developed hives after given contrast  . Benzene Rash    Medications Prior to Admission  Medication Sig Dispense Refill  . albuterol (PROVENTIL HFA;VENTOLIN HFA) 108 (90 BASE) MCG/ACT inhaler Inhale 1 puff into the lungs 2 (two) times daily as needed for wheezing or shortness of breath.      [name] aspirin EC 81 MG EC tablet Take 1 tablet (81 mg total) by mouth daily.  30 tablet  3  . atorvastatin (LIPITOR) 40 MG tablet Take 1 tablet (40 mg total) by mouth daily at 6 PM.  30 tablet  3  . Calcium Citrate (CALCITRATE PO) Take 1 tablet by mouth daily.      [name] torsemide (DEMADEX) 20 MG tablet Take 2 tablets (40 mg total) by mouth 2 (two) times daily.  120 tablet  3    Results for orders placed during the hospital encounter of 10/07/12 (from the past 48 hour(s))  TYPE AND SCREEN     Status: None   Collection Time    10/07/12  8:07 PM      Result Value Range   ABO/RH(D) A POS     Antibody Screen NEG     Sample Expiration 10/10/2012     Unit Number [id number]      Blood Component Type RED CELLS,LR     Unit division 00     Status of Unit ISSUED,FINAL     Transfusion Status OK TO TRANSFUSE     Crossmatch Result Compatible     Unit Number [id number]     Blood Component Type RED CELLS,LR     Unit division 00     Status of Unit ISSUED,FINAL     Transfusion Status OK TO TRANSFUSE     Crossmatch Result Compatible     Unit Number [id number]     Blood Component Type RED CELLS,LR     Unit division 00     Status of Unit ISSUED,FINAL     Transfusion Status OK TO TRANSFUSE     Crossmatch Result Compatible     Unit Number [id number]     Blood Component Type RED CELLS,LR     Unit division 00     Status of Unit ISSUED,FINAL     Transfusion Status OK TO TRANSFUSE     Crossmatch Result Compatible    PREPARE RBC (CROSSMATCH)     Status: None   Collection Time  10/07/12  8:07 PM      Result Value Range   Order Confirmation ORDER PROCESSED BY BLOOD BANK    ABO/RH     Status: None   Collection Time    10/07/12  8:07 PM      Result Value Range   ABO/RH(D) A POS    URINALYSIS, ROUTINE W REFLEX MICROSCOPIC     Status: Abnormal   Collection Time    10/08/12  3:45 AM      Result Value Range   Color, Urine YELLOW  YELLOW   APPearance CLOUDY (*) CLEAR   Specific Gravity, Urine 1.010  1.005 - 1.030   pH 6.0  5.0 - 8.0   Glucose, UA NEGATIVE  NEGATIVE mg/dL   Hgb urine dipstick SMALL (*) NEGATIVE   Bilirubin Urine NEGATIVE  NEGATIVE   Ketones, ur NEGATIVE  NEGATIVE mg/dL   Protein, ur NEGATIVE  NEGATIVE mg/dL   Urobilinogen, UA 0.2  0.0 - 1.0 mg/dL   Nitrite NEGATIVE  NEGATIVE   Leukocytes, UA MODERATE (*) NEGATIVE  URINE CULTURE     Status: None   Collection Time    10/08/12  3:45 AM      Result Value Range   Specimen Description URINE, CLEAN CATCH     Special Requests NONE     Culture  Setup Time 10/08/2012 09:17     Colony Count NO GROWTH     Culture NO GROWTH     Report Status 10/09/2012 FINAL    URINE MICROSCOPIC-ADD ON     Status:  Abnormal   Collection Time    10/08/12  3:45 AM      Result Value Range   Squamous Epithelial / LPF RARE  RARE   WBC, UA 11-20  <3 WBC/hpf   RBC / HPF 3-6  <3 RBC/hpf   Bacteria, UA FEW (*) RARE  BASIC METABOLIC PANEL     Status: Abnormal   Collection Time    10/08/12  5:00 AM      Result Value Range   Sodium 138  135 - 145 mEq/L   Potassium 3.0 (*) 3.5 - 5.1 mEq/L   Chloride 94 (*) 96 - 112 mEq/L   CO2 37 (*) 19 - 32 mEq/L   Glucose, Bld 181 (*) 70 - 99 mg/dL   BUN 54 (*) 6 - 23 mg/dL   Creatinine, Ser 1.61 (*) 0.50 - 1.10 mg/dL   Calcium 8.2 (*) 8.4 - 10.5 mg/dL   GFR calc non Af Amer 35 (*) >90 mL/min   GFR calc Af Amer 41 (*) >90 mL/min   Comment:            The eGFR has been calculated     using the CKD EPI equation.     This calculation has not been     validated in all clinical     situations.     eGFR's persistently     <90 mL/min signify     possible Chronic Kidney Disease.  CBC     Status: Abnormal   Collection Time    10/08/12  5:00 AM      Result Value Range   WBC 9.2  4.0 - 10.5 K/uL   RBC 2.32 (*) 3.87 - 5.11 MIL/uL   Hemoglobin 6.4 (*) 12.0 - 15.0 g/dL   Comment: POST TRANSFUSION SPECIMEN     REPEATED TO VERIFY     CRITICAL VALUE NOTED.  VALUE IS CONSISTENT WITH PREVIOUSLY REPORTED AND CALLED VALUE.  HCT 19.7 (*) 36.0 - 46.0 %   MCV 84.9  78.0 - 100.0 fL   MCH 27.6  26.0 - 34.0 pg   MCHC 32.5  30.0 - 36.0 g/dL   RDW 09.8 (*) 11.9 - 14.7 %   Platelets 226  150 - 400 K/uL   Comment: DELTA CHECK NOTED     REPEATED TO VERIFY  MAGNESIUM     Status: None   Collection Time    10/08/12  5:00 AM      Result Value Range   Magnesium 1.9  1.5 - 2.5 mg/dL  PREPARE RBC (CROSSMATCH)     Status: None   Collection Time    10/08/12  7:00 AM      Result Value Range   Order Confirmation ORDER PROCESSED BY BLOOD BANK    BLOOD GAS, ARTERIAL     Status: Abnormal   Collection Time    10/08/12 10:30 AM      Result Value Range   O2 Content 2.0     Delivery  systems NASAL CANNULA     pH, Arterial 7.413  7.350 - 7.450   pCO2 arterial 53.9 (*) 35.0 - 45.0 mmHg   pO2, Arterial 70.1 (*) 80.0 - 100.0 mmHg   Bicarbonate 33.7 (*) 20.0 - 24.0 mEq/L   TCO2 35.4  0 - 100 mmol/L   Acid-Base Excess 8.9 (*) 0.0 - 2.0 mmol/L   O2 Saturation 95.7     Patient temperature 98.6     Collection site RIGHT BRACHIAL     Drawn by [id number]    CBC     Status: Abnormal   Collection Time    10/08/12  4:25 PM      Result Value Range   WBC 9.6  4.0 - 10.5 K/uL   RBC 3.22 (*) 3.87 - 5.11 MIL/uL   Hemoglobin 9.0 (*) 12.0 - 15.0 g/dL   Comment: POST TRANSFUSION SPECIMEN   HCT 27.3 (*) 36.0 - 46.0 %   MCV 84.8  78.0 - 100.0 fL   MCH 28.0  26.0 - 34.0 pg   MCHC 33.0  30.0 - 36.0 g/dL   RDW 13.0 (*) 86.5 - 78.4 %   Platelets 222  150 - 400 K/uL  CBC     Status: Abnormal   Collection Time    10/09/12  6:55 AM      Result Value Range   WBC 11.4 (*) 4.0 - 10.5 K/uL   RBC 3.29 (*) 3.87 - 5.11 MIL/uL   Hemoglobin 8.9 (*) 12.0 - 15.0 g/dL   HCT 69.6 (*) 29.5 - 28.4 %   MCV 85.4  78.0 - 100.0 fL   MCH 27.1  26.0 - 34.0 pg   MCHC 31.7  30.0 - 36.0 g/dL   RDW 13.2 (*) 44.0 - 10.2 %   Platelets 215  150 - 400 K/uL  BASIC METABOLIC PANEL     Status: Abnormal   Collection Time    10/09/12  6:55 AM      Result Value Range   Sodium 139  135 - 145 mEq/L   Potassium 4.5  3.5 - 5.1 mEq/L   Comment: NO VISIBLE HEMOLYSIS   Chloride 98  96 - 112 mEq/L   CO2 29  19 - 32 mEq/L   Glucose, Bld 235 (*) 70 - 99 mg/dL   BUN 32 (*) 6 - 23 mg/dL   Comment: DELTA CHECK NOTED   Creatinine, Ser 1.30 (*) 0.50 - 1.10 mg/dL  Calcium 9.0  8.4 - 10.5 mg/dL   GFR calc non Af Amer 42 (*) >90 mL/min   GFR calc Af Amer 49 (*) >90 mL/min   Comment:            The eGFR has been calculated     using the CKD EPI equation.     This calculation has not been     validated in all clinical     situations.     eGFR's persistently     <90 mL/min signify     possible Chronic Kidney Disease.    Ct Chest W Contrast  10/08/2012  *RADIOLOGY REPORT*  Clinical Data:  Hemorrhage.  CT CHEST, ABDOMEN AND PELVIS WITH CONTRAST  Technique:  Multidetector CT imaging of the chest, abdomen and pelvis was performed following the standard protocol during bolus administration of intravenous contrast.  Contrast: OMNIPAQUE IOHEXOL 300 MG/ML  SOLN  Comparison:  Chest x-ray 09/30/2012.  Abdominal ultrasound 09/24/2012.  CT CHEST  Findings:  The heart is mildly enlarged.  Atherosclerotic calcifications are present in the aorta.  Irregularity of the thyroid is present with asymmetric enlargement of the left lobe. No significant mediastinal or axillary adenopathy is present.  A small left pleural effusion is present.  A 6 mm ground-glass nodule is present in the right upper lobe. There is partial collapse of the left lower lobe.  Minimal right basilar atelectasis is present.  The bone windows demonstrate a diffuse sclerotic pattern suggesting multiple myeloma or metastasis.  IMPRESSION:  1.  6 mm ground-glass nodule in the right upper lobe. Adenocarcinoma cannot be excluded.  Followup by CT is recommended in 12 months, with continued annual surveillance for a minimum of 3 years.  These recommendations are taken from "Recommendations for the Management of Subsolid Pulmonary Nodules Detected at CT:  A Statement from the Fleischner Society" Radiology 2013; 266:1, 304- 317. 2.  Mild cardiomegaly without failure. 3.  Atherosclerosis. 4.  Extensive osseous sclerotic lesions suggesting diffuse metastatic disease. 4.  Small left pleural effusion. 5.  Partial collapse of the left lower lobe. 6.  Mild right basilar atelectasis.  CT ABDOMEN AND PELVIS  Findings:  Mild fatty infiltration is present in the liver.  No focal hepatic lesions are present.  A soft tissue mass or hemorrhages present anterior to the spleen, measuring 2.9 x 4.9 x 3.4 cm.  There is a hypodense area anteriorly in spleen, adjacent to this which measures 3.5  cm.  The stomach, duodenum, and pancreas are within normal limits.  The adrenal glands are normal bilaterally.  A simple cyst posteriorly in the left kidney measures 2.4 cm.  A large staghorn calculus is present within the left kidney.  There is no evidence for obstruction.  No right-sided stones are present.  A large exophytic cyst is noted anteriorly in the right kidney.  Moderate stool is present within the rectosigmoid colon.  The remainder of the colon is unremarkable.  Contrast can be seen to the descending colon.  The remainder the colon is unremarkable. The appendix is visualized and normal.  Small bowel is within normal limits. Calcifications and exophytic lesions of the uterus are compatible with multiple fibroids.  The adnexa are within normal limits for age.  A Foley catheter is present within the urinary bladder.  Multiple sclerotic lesions are present throughout the thoracolumbar spine, compatible with metastases.  IMPRESSION:  1.  Extensive sclerotic lesions throughout the thoracolumbar spine are compatible with metastases are less likely myeloma. 2.  Multiple uterine fibroids. 3.  4.9 x 3.4 cm soft tissue mass versus hematoma anterior to the spleen. 4.  3.5 cm hypodense lesion within the anterior spleen could represent a spontaneous hemorrhage versus a metastasis. 5.  Atherosclerosis. 6.  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   Original Report Authenticated By: Marin Roberts, M.D.    Ct Abdomen Pelvis W Contrast  10/08/2012  *RADIOLOGY REPORT*  Clinical Data:  Hemorrhage.  CT CHEST, ABDOMEN AND PELVIS WITH CONTRAST  Technique:  Multidetector CT imaging of the chest, abdomen and pelvis was performed following the standard protocol during bolus administration of intravenous contrast.  Contrast: OMNIPAQUE IOHEXOL 300 MG/ML  SOLN  Comparison:  Chest x-ray 09/30/2012.  Abdominal ultrasound 09/24/2012.  CT CHEST  Findings:  The heart is mildly enlarged.   Atherosclerotic calcifications are present in the aorta.  Irregularity of the thyroid is present with asymmetric enlargement of the left lobe. No significant mediastinal or axillary adenopathy is present.  A small left pleural effusion is present.  A 6 mm ground-glass nodule is present in the right upper lobe. There is partial collapse of the left lower lobe.  Minimal right basilar atelectasis is present.  The bone windows demonstrate a diffuse sclerotic pattern suggesting multiple myeloma or metastasis.  IMPRESSION:  1.  6 mm ground-glass nodule in the right upper lobe. Adenocarcinoma cannot be excluded.  Followup by CT is recommended in 12 months, with continued annual surveillance for a minimum of 3 years.  These recommendations are taken from "Recommendations for the Management of Subsolid Pulmonary Nodules Detected at CT:  A Statement from the Fleischner Society" Radiology 2013; 266:1, 304- 317. 2.  Mild cardiomegaly without failure. 3.  Atherosclerosis. 4.  Extensive osseous sclerotic lesions suggesting diffuse metastatic disease. 4.  Small left pleural effusion. 5.  Partial collapse of the left lower lobe. 6.  Mild right basilar atelectasis.  CT ABDOMEN AND PELVIS  Findings:  Mild fatty infiltration is present in the liver.  No focal hepatic lesions are present.  A soft tissue mass or hemorrhages present anterior to the spleen, measuring 2.9 x 4.9 x 3.4 cm.  There is a hypodense area anteriorly in spleen, adjacent to this which measures 3.5 cm.  The stomach, duodenum, and pancreas are within normal limits.  The adrenal glands are normal bilaterally.  A simple cyst posteriorly in the left kidney measures 2.4 cm.  A large staghorn calculus is present within the left kidney.  There is no evidence for obstruction.  No right-sided stones are present.  A large exophytic cyst is noted anteriorly in the right kidney.  Moderate stool is present within the rectosigmoid colon.  The remainder of the colon is  unremarkable.  Contrast can be seen to the descending colon.  The remainder the colon is unremarkable. The appendix is visualized and normal.  Small bowel is within normal limits. Calcifications and exophytic lesions of the uterus are compatible with multiple fibroids.  The adnexa are within normal limits for age.  A Foley catheter is present within the urinary bladder.  Multiple sclerotic lesions are present throughout the thoracolumbar spine, compatible with metastases.  IMPRESSION:  1.  Extensive sclerotic lesions throughout the thoracolumbar spine are compatible with metastases are less likely myeloma. 2.  Multiple uterine fibroids. 3.  4.9 x 3.4 cm soft tissue mass versus hematoma anterior to the spleen. 4.  3.5 cm hypodense lesion within the anterior spleen could represent a spontaneous hemorrhage versus a metastasis.  5.  Atherosclerosis. 6.  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   Original Report Authenticated By: Marin Roberts, M.D.     Review of Systems  Gastrointestinal: Positive for abdominal pain.    Blood pressure 126/66, pulse 69, temperature 97.5 F (36.4 C), temperature source Oral, resp. rate 18, weight 239 lb 3.2 oz (108.5 kg), SpO2 94.00%. Physical Exam  Cardiovascular: Normal rate, regular rhythm and normal heart sounds.   Respiratory: Effort normal. She has wheezes.  GI: There is tenderness.  Tenderness in left lateral abdomen. Airway: good  Assessment/Plan 67 yo with multiple bone lesions that are concerning for metastatic disease.  The patient needs a tissue diagosis.  Review of the recent CT demonstrates multiple bone lesions.  Lesions in the iliac bone would be amenable to CT guided biopsy.  Indeterminate lesion adjacent to the spleen.  Discussed the procedure with patient and daughter.  Informed consent obtained and plan for CT guided iliac bone biopsy tomorrow.  With regards to the breast workup, recommend a mammogram and then biopsy  as needed.    Kayn Haymore RYAN 10/09/2012, 5:38 PM

## 2012-10-09 NOTE — Progress Notes (Signed)
Pt is refusing to wear BiPAP. She feels that she does not need to wear it. Breath sounds were clear diminished. Vitals are stable HR 75 RR 18 Oxygen Saturation on 2 LPM was 100%. Rt informed pt that if she would like to go on BiPAP at anytime to call. Rt will continue to monitor pt.

## 2012-10-09 NOTE — Progress Notes (Signed)
Family Medicine Teaching Service Daily Progress Note Service Page: [phone]  Patient Assessment: 67 yo F w/ complicated PMHx including recent admit 09/24/2012 - 10/02/2012 for acute diastolic CHF exacerbation and COPD exacerbation, NSTEMI, anemia, breast mass, leg ulcers, fiborma, CIN lll and electrolyte abnormalities presents with low Hg of 5.4 g/dL via clinic results from home.  Subjective: The patient says she feels about the same and denies pain currently. Her son and 2 others were in the room with her and the difficult conversation was had with the patient regarding her recent CT results and her high probability of a metastatic cancer from an unclear origin currently. The patient and guests understood with appropriate affect. The patient continues to deny N/V/D, CP, fever.  Objective: Temp:  [97.9 F (36.6 C)-98.7 F (37.1 C)] 98 F (36.7 C) (02/13 0517) Pulse Rate:  [70-87] 72 (02/13 0517) Resp:  [16-20] 16 (02/13 0517) BP: (105-123)/(44-60) 115/59 mmHg (02/13 0517) SpO2:  [94 %-98 %] 95 % (02/13 0517) Weight:  [239 lb 3.2 oz (108.5 kg)] 239 lb 3.2 oz (108.5 kg) (02/13 0517)  Exam: General: Somnolent, nasal cannula present, sitting upright leaned over the food table in anticipation for breakfast, son and 2 other contacts in the room. Cardiovascular: RRR, no rubs, murmurs, gallops on auscultation with isolation stethoscope.  Respiratory: Clear breath sounds bilaterally on auscultation with isolation stethoscope.  Abdomen: +BS, diffuse tenderness on moderate/deep palpation.  Extremities: LL edema bilaterally 2+ pitting. Lower leg ulcers bilaterally, 1 ulcer covered on L leg, no current weeping or evidence of active cellulitis.  Neuro: CN 2-12 grossly intact.   I have reviewed the patient's medications, labs, imaging, and diagnostic testing.  Notable results are summarized below.  Medications:  Scheduled Meds:  Lipitor 40 mg PO q1800  Calcium Citrate 200 m tab PO TID  Protonix 80  mg/100 mL IVPB PO BID   PRN Meds:  Albuterol 2 puff IN q6h PRN   IVF:  50 mL/hr IV NS   Labs:  CBC  Recent Labs Lab 10/07/12 1620 10/08/12 0500 10/08/12 1625  WBC 11.8* 9.2 9.6  HGB 5.4* 6.4* 9.0*  HCT 18.2* 19.7* 27.3*  PLT 308 226 222    BMET  Recent Labs Lab 10/06/12 1145 10/07/12 1620 10/08/12 0500  NA 141 141 138  K 3.3* 3.0* 3.0*  CL 92* 93* 94*  CO2 39* 38* 37*  BUN 68* 61* 54*  CREATININE 1.56* 1.52* 1.50*  GLUCOSE 169* 146* 181*  CALCIUM 7.9* 8.6 8.2*    Imaging/Diagnostic Tests:  CT Chest- 10/08/2012 Findings: The heart is mildly enlarged. Atherosclerotic  calcifications are present in the aorta. Irregularity of the  thyroid is present with asymmetric enlargement of the left lobe.  No significant mediastinal or axillary adenopathy is present. A  small left pleural effusion is present.  A 6 mm ground-glass nodule is present in the right upper lobe.  There is partial collapse of the left lower lobe. Minimal right  basilar atelectasis is present.  The bone windows demonstrate a diffuse sclerotic pattern suggesting  multiple myeloma or metastasis.   IMPRESSION:  1. 6 mm ground-glass nodule in the right upper lobe.  Adenocarcinoma cannot be excluded. Followup by CT is recommended  in 12 months, with continued annual surveillance for a minimum of 3  years.  These recommendations are taken from "Recommendations for the  Management of Subsolid Pulmonary Nodules Detected at CT: A  Statement from the Fleischner Society" Radiology 2013; 266:1, 304-  317.  2. Mild  cardiomegaly without failure.  3. Atherosclerosis.  4. Extensive osseous sclerotic lesions suggesting diffuse  metastatic disease.  4. Small left pleural effusion.  5. Partial collapse of the left lower lobe  CT Abdomen/Pelvis- 10/08/2012 Findings: Mild fatty infiltration is present in the liver. No  focal hepatic lesions are present. A soft tissue mass or  hemorrhages present anterior  to the spleen, measuring 2.9 x 4.9 x  3.4 cm. There is a hypodense area anteriorly in spleen, adjacent  to this which measures 3.5 cm. The stomach, duodenum, and pancreas  are within normal limits. The adrenal glands are normal  bilaterally. A simple cyst posteriorly in the left kidney measures  2.4 cm. A large staghorn calculus is present within the left  kidney. There is no evidence for obstruction. No right-sided  stones are present. A large exophytic cyst is noted anteriorly in  the right kidney.  Moderate stool is present within the rectosigmoid colon. The  remainder of the colon is unremarkable. Contrast can be seen to  the descending colon. The remainder the colon is unremarkable.  The appendix is visualized and normal. Small bowel is within  normal limits. Calcifications and exophytic lesions of the uterus  are compatible with multiple fibroids. The adnexa are within  normal limits for age. A Foley catheter is present within the  urinary bladder.  Multiple sclerotic lesions are present throughout the thoracolumbar  spine, compatible with metastases.   IMPRESSION:  1. Extensive sclerotic lesions throughout the thoracolumbar spine  are compatible with metastases are less likely myeloma.  2. Multiple uterine fibroids.  3. 4.9 x 3.4 cm soft tissue mass versus hematoma anterior to the  spleen.  4. 3.5 cm hypodense lesion within the anterior spleen could  represent a spontaneous hemorrhage versus a metastasis.  5. Atherosclerosis.  6. Staghorn calculus of the left kidney without evidence for  obstruction.   Assessment/Plan:  # Metastatic process (Hx of CIN 3, Endometrial Mass, Breast Mass, Concern for colonic bleed)  CT scan with contrast of Chest, Abdomen, Pelvis completed with results shown above in Imaging section and include Extensive sclerotic lesions throughout the thoracolumbar spine compatible with metastases are less likely myeloma and 3.5 cm hypodense lesion within the  anterior spleen could represent a spontaneous hemorrhage versus a metastasis. Will consult Oncology and their input is much appreciated. Unknown cancer origin- Cervical vs colon vs breast. HX CIN lll sometime around a year ago with refusal for sx. R breast mass- 7 x 9 cm found 09/26/2011 during bathing by nurses.  Tells she never had a colonoscopy due to refusing of procedure, but told GI she had one w/in last 5 years which may have had polyps. Had discussion with patient about CT findings/ possible implications, son and 2 contacts present in room.  # Anemia (severe), normocytic w/ Melana:  Melana x 3 days at home. No hematochezia. Hemoccult +.  Hg was intermittently low during last hospitalization from from 09/24/2012 - 10/02/2012 w/o tranfusion- 7.8 (2/1) on admit and 9.7 (2/6) on discharge 4 days prior to last draw. CT results reveal 4.9 x 3.4 cm soft tissue mass versus hematoma anterior to the spleen. This may be a causative factor for pt's low Hg.  Pt admit with Hb 5.4, s/p 2 U blood --> Hb 6.4. + 2 subsequent U of blood --> 9.0 g/dL. Iron on 09/26/2012- 124 ug/dL.  Ferritin elevated on 09/26/2012- 963 ng/mL.  Tranfusion if Hg <7.0; consider for less than 8.0 given history of recent NSTEMI.    #  CKD Stage 3:  Baseline Cr of 1.0 to 1.5. At baseline. Will give gentle fluids for IV contrast.  Cr 1.5 mg/dL on admit.   #. Hypokalemia  3.0 mEq/L on admit, 4.5 this AM (10/09/2012). S/p 80 mEq yesterday (10/08/2012). Mg 1.9 mg/dL.  Hx Hypokalemia.  Monitor due to CKD stage lll.   # Diastolic CHF (grade 2 diastolic) & Pulmonary Hypertension: good systolic function, EF 55% on Echo 09/25/2012.   Monitor for volume overload.   # COPD, OSA, OHS, uses home BIPAP and O2 Dependent:  Continue O2 PRN  Conitnue Home BiPap   # DM2:  Recent A1c of 6.6. Needs further workup as OP. Not candidate for Metformin due to renal function  SSI.   #. Urine analysis- 10/08/2012  Ph- 6.0. Moderate leukocytes,  small amoutn of Hgb, 11-20 WBC and few bacteria.  Urine culture sent and pending.   #. 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    FEN: 50 mL/hr NS,monitor electrolytes, cardiac diet.   DVT Prophy: SCDs.(pharm contraindicated due to Hg conc.)    The patient was discussed with and seen/examined by Dr. Berline Chough and Dr. Jennette Kettle.  [facility]  R2 Addendum: Present with Dr. Jennette Kettle and MS Salli Real to discussed results of CT scan with patient and family members.  Concerning for metastatic process with multiple potential sources at this time (Breast, Endometrial, Colon)  Objective. BP 115/59  Pulse 72  Temp(Src) 98 F (36.7 C) (Oral)  Resp 16  Wt 239 lb 3.2 oz (108.5 kg)  BMI 45.93 kg/m2  SpO2 95% PE: GENERAL:  Obese caucasian  female. In no discomfort; no respiratory distress. PSYCH: Alert and appropriately interactive; Insight:Good   H&N: AT/Harmony, trachea midline EENT:  MMM, no scleral icterus, EOMi HEART: RRR, S1/S2 heard, no murmur LUNGS: CTA B, no wheezes, no crackles EXTREMITIES: Moves all 4 extremities spontaneously, warm well perfused, no edema, bilateral DP and PT pulses 2/4.    Assessment and Plan: 68 yo with ABL anemia with melana and likely metastatic process given findings on CT chest/abdomen pelvis.    # Likely Metastatic Process: Multiple potential sources including Breast (mass), Uterus (CIN III), Bone (lesions, likely mets), ?GI source given Melana, Lung (nodule on CT, but less likely).  # Anemia, severe Acute on chronic:  + Melana and likely peri-splenic bleed.  S/p 4 units.  GI following.    ? Upper vs lower GI source for melana.  ?stress ulcer from recent ICU stay.  On high dose PPI.  ? Need for colonoscopy vs EGD.  Appreciate GI's assessment   # RESP: COPD, OSA, OHS, Home BiPap but non compliant, O2 Dependent  Stable, continue O2 after trying to d/c yesterday pt persistently hypoxia  #  DM2:  SSI  DISPO: Pt Hb stable following transfusion.  Will need tissue sample and Oncology consult.  Could consider FNA of breast as potential tissue sample but must also consider multiple primary sources given hx of CIN III as well.   Andrena Mews, DO [facility] Resident - PGY-2 10/09/2012 10:13 AM

## 2012-10-09 NOTE — Progress Notes (Signed)
HR dropped down to 37, didn't sustain.  Denies complaints.  VSS, NAD.  R FA IV out, catheter intact.  L AC IV site occluded.  Pt refusing new IV site and labs, maybe later.  Will continue to monitor.

## 2012-10-09 NOTE — Progress Notes (Signed)
Discussed pt refusal of labs, refusal of new IV site, and HR of 37 (nonsustained) with Dr Erlene Senters.  No new orders received, will continue to monitor.

## 2012-10-09 NOTE — Progress Notes (Signed)
Patient still ambivalent about whether she wants colonoscopy and/or EGD. She was liked talk to her family about it. She's artery had a regular diet today so we'll not set up for tomorrow. Will revisit tomorrow.

## 2012-10-09 NOTE — Progress Notes (Signed)
Pt discussed with Dr. Gaylyn Rong.  Needs Bone Biopsy per IR and Breast FNA by Breast Center/Surgeon.  Arranged Bone Biopsy per IR - likely pelvic lesion  Breast Center closed today due to weather will try again tomorrow; consider Surgery or IR to obtain breast tissue.  Andrena Mews, DO [facility] Resident - PGY-2 10/09/2012 2:35 PM

## 2012-10-10 ENCOUNTER — Inpatient Hospital Stay (HOSPITAL_COMMUNITY): Payer: Medicare Other

## 2012-10-10 ENCOUNTER — Encounter (HOSPITAL_COMMUNITY): Payer: Self-pay | Admitting: General Surgery

## 2012-10-10 DIAGNOSIS — C50919 Malignant neoplasm of unspecified site of unspecified female breast: Secondary | ICD-10-CM

## 2012-10-10 LAB — BASIC METABOLIC PANEL
CO2: 27 mEq/L (ref 19–32)
GFR calc non Af Amer: 49 mL/min — ABNORMAL LOW (ref >90)
Glucose, Bld: 116 mg/dL — ABNORMAL HIGH (ref 70–99)
Potassium: 3.7 mEq/L (ref 3.5–5.1)
Sodium: 140 mEq/L (ref 135–145)

## 2012-10-10 LAB — PROTIME-INR
INR: 0.97 (ref 0.00–1.49)
Prothrombin Time: 12.8 seconds (ref 11.6–15.2)

## 2012-10-10 LAB — CBC
Hemoglobin: 8.5 g/dL — ABNORMAL LOW (ref 12.0–15.0)
Platelets: 184 10*3/uL (ref 150–400)
RBC: 3.13 MIL/uL — ABNORMAL LOW (ref 3.87–5.11)

## 2012-10-10 MED ORDER — MIDAZOLAM HCL 2 MG/2ML IJ SOLN
INTRAMUSCULAR | Status: AC | PRN
  Administered 2012-10-10: 1 mg via INTRAVENOUS
  Administered 2012-10-10 (×2): 0.5 mg via INTRAVENOUS

## 2012-10-10 MED ORDER — FENTANYL CITRATE 0.05 MG/ML IJ SOLN
INTRAMUSCULAR | Status: AC | PRN
  Administered 2012-10-10: 50 ug via INTRAVENOUS
  Administered 2012-10-10 (×2): 25 ug via INTRAVENOUS

## 2012-10-10 MED ORDER — LIDOCAINE-EPINEPHRINE 2 %-1:100000 IJ SOLN
20.0000 mL | Freq: Once | INTRAMUSCULAR | Status: AC
  Filled 2012-10-10 (×2): qty 20

## 2012-10-10 MED ORDER — FENTANYL CITRATE 0.05 MG/ML IJ SOLN
INTRAMUSCULAR | Status: AC
  Filled 2012-10-10: qty 4

## 2012-10-10 MED ORDER — MIDAZOLAM HCL 2 MG/2ML IJ SOLN
INTRAMUSCULAR | Status: AC
  Filled 2012-10-10: qty 4

## 2012-10-10 NOTE — Procedures (Signed)
Technically successful CT guided bone biopsy of left iliac crest.. No immediate complications. Awaiting pathology report.

## 2012-10-10 NOTE — Consult Note (Signed)
Anita [name] 02/11/1946  [id number].   Primary Care MD: Dr. Everlene Other Requesting MD: Dr. Jethro Bolus Chief Complaint/Reason for Consult: breast mass HPI: This is a 67yo female who was admitted for anemia by her PCP several days ago.  She had a hgb of 5.8.  She was admitted and transfused.  She was found to have a large right breast mass.  She then had further imaging which reveals possible osseous metastatic disease.  We have been asked to see her for a biopsy.  The patient states she did not know about this until this admission.  Review of Systems: Please see HPI, wears 4L of O2 at home, otherwise negative.  Family History  Problem Relation Age of Onset  . Cancer Mother     leukemia  . Hypertension Mother   . Diabetes Father   . Heart disease Father     passed away due to heart attack    Past Medical History  Diagnosis Date  . Fibroid   . Morbid obesity   . 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 COPD (chronic obstructive pulmonary disease)     History reviewed. No pertinent past surgical history.  Social History:  reports that she has been smoking Cigarettes.  She has a 50 pack-year smoking history. She has never used smokeless tobacco. She reports that she does not drink alcohol or use illicit drugs.  Allergies:  Allergies  Allergen Reactions  . Omnipaque (Iohexol)     Pt claims she developed hives after given contrast  . Benzene Rash    Medications Prior to Admission  Medication Sig Dispense Refill  . albuterol (PROVENTIL HFA;VENTOLIN HFA) 108 (90 BASE) MCG/ACT inhaler Inhale 1 puff into the lungs 2 (two) times daily as needed for wheezing or shortness of breath.      [name] aspirin EC 81 MG EC tablet Take 1 tablet (81 mg total) by mouth daily.  30 tablet  3  . atorvastatin (LIPITOR) 40 MG tablet Take 1 tablet (40 mg total) by mouth daily at 6 PM.  30 tablet  3  . Calcium Citrate (CALCITRATE PO) Take 1 tablet by mouth daily.      [name]  torsemide (DEMADEX) 20 MG tablet Take 2 tablets (40 mg total) by mouth 2 (two) times daily.  120 tablet  3    Blood pressure 135/55, pulse 76, temperature 98.1 F (36.7 C), temperature source Oral, resp. rate 20, weight 228 lb 13.4 oz (103.8 kg), SpO2 97.00%. Physical Exam: General: obese white female who is laying in bed in NAD HEENT: head is normocephalic, atraumatic.  Sclera are noninjected.  PERRL.  Ears and nose without any masses or lesions.  Mouth is pink and moist.  [place] in place Heart: regular, rate, and rhythm.  Normal s1,s2. No obvious murmurs, gallops, or rubs noted.  Palpable radial and pedal pulses bilaterally Lungs: CTAB, no wheezes, rhonchi, or rales noted.  Respiratory effort nonlabored Breast: tennis ball size mass in right breast.  No peau d' orange, nontender, no inverted nipple of abnormal nipple discharge.  Normal left breast. Abd: rotund, +BS, multiple ecchymosis from her DVT prophylaxis shots, tender on left side Skin: warm and dry with no masses, lesions, or rashes Psych: A&Ox3 with an appropriate affect.    Results for orders placed during the hospital encounter of 10/07/12 (from the past 48 hour(s))  CBC     Status: Abnormal   Collection Time  10/08/12  4:25 PM      Result Value Range   WBC 9.6  4.0 - 10.5 K/uL   RBC 3.22 (*) 3.87 - 5.11 MIL/uL   Hemoglobin 9.0 (*) 12.0 - 15.0 g/dL   Comment: POST TRANSFUSION SPECIMEN   HCT 27.3 (*) 36.0 - 46.0 %   MCV 84.8  78.0 - 100.0 fL   MCH 28.0  26.0 - 34.0 pg   MCHC 33.0  30.0 - 36.0 g/dL   RDW 30.8 (*) 65.7 - 84.6 %   Platelets 222  150 - 400 K/uL  CBC     Status: Abnormal   Collection Time    10/09/12  6:55 AM      Result Value Range   WBC 11.4 (*) 4.0 - 10.5 K/uL   RBC 3.29 (*) 3.87 - 5.11 MIL/uL   Hemoglobin 8.9 (*) 12.0 - 15.0 g/dL   HCT 96.2 (*) 95.2 - 84.1 %   MCV 85.4  78.0 - 100.0 fL   MCH 27.1  26.0 - 34.0 pg   MCHC 31.7  30.0 - 36.0 g/dL   RDW 32.4 (*) 40.1 - 02.7 %   Platelets 215  150 - 400  K/uL  BASIC METABOLIC PANEL     Status: Abnormal   Collection Time    10/09/12  6:55 AM      Result Value Range   Sodium 139  135 - 145 mEq/L   Potassium 4.5  3.5 - 5.1 mEq/L   Comment: NO VISIBLE HEMOLYSIS   Chloride 98  96 - 112 mEq/L   CO2 29  19 - 32 mEq/L   Glucose, Bld 235 (*) 70 - 99 mg/dL   BUN 32 (*) 6 - 23 mg/dL   Comment: DELTA CHECK NOTED   Creatinine, Ser 1.30 (*) 0.50 - 1.10 mg/dL   Calcium 9.0  8.4 - 25.3 mg/dL   GFR calc non Af Amer 42 (*) >90 mL/min   GFR calc Af Amer 49 (*) >90 mL/min   Comment:            The eGFR has been calculated     using the CKD EPI equation.     This calculation has not been     validated in all clinical     situations.     eGFR's persistently     <90 mL/min signify     possible Chronic Kidney Disease.  PROTIME-INR     Status: None   Collection Time    10/10/12  5:05 AM      Result Value Range   Prothrombin Time 12.8  11.6 - 15.2 seconds   INR 0.97  0.00 - 1.49   Ct Chest W Contrast  10/08/2012  *RADIOLOGY REPORT*  Clinical Data:  Hemorrhage.  CT CHEST, ABDOMEN AND PELVIS WITH CONTRAST  Technique:  Multidetector CT imaging of the chest, abdomen and pelvis was performed following the standard protocol during bolus administration of intravenous contrast.  Contrast: OMNIPAQUE IOHEXOL 300 MG/ML  SOLN  Comparison:  Chest x-ray 09/30/2012.  Abdominal ultrasound 09/24/2012.  CT CHEST  Findings:  The heart is mildly enlarged.  Atherosclerotic calcifications are present in the aorta.  Irregularity of the thyroid is present with asymmetric enlargement of the left lobe. No significant mediastinal or axillary adenopathy is present.  A small left pleural effusion is present.  A 6 mm ground-glass nodule is present in the right upper lobe. There is partial collapse of the left lower lobe.  Minimal  right basilar atelectasis is present.  The bone windows demonstrate a diffuse sclerotic pattern suggesting multiple myeloma or metastasis.  IMPRESSION:   1.  6 mm ground-glass nodule in the right upper lobe. Adenocarcinoma cannot be excluded.  Followup by CT is recommended in 12 months, with continued annual surveillance for a minimum of 3 years.  These recommendations are taken from "Recommendations for the Management of Subsolid Pulmonary Nodules Detected at CT:  A Statement from the Fleischner Society" Radiology 2013; 266:1, 304- 317. 2.  Mild cardiomegaly without failure. 3.  Atherosclerosis. 4.  Extensive osseous sclerotic lesions suggesting diffuse metastatic disease. 4.  Small left pleural effusion. 5.  Partial collapse of the left lower lobe. 6.  Mild right basilar atelectasis.  CT ABDOMEN AND PELVIS  Findings:  Mild fatty infiltration is present in the liver.  No focal hepatic lesions are present.  A soft tissue mass or hemorrhages present anterior to the spleen, measuring 2.9 x 4.9 x 3.4 cm.  There is a hypodense area anteriorly in spleen, adjacent to this which measures 3.5 cm.  The stomach, duodenum, and pancreas are within normal limits.  The adrenal glands are normal bilaterally.  A simple cyst posteriorly in the left kidney measures 2.4 cm.  A large staghorn calculus is present within the left kidney.  There is no evidence for obstruction.  No right-sided stones are present.  A large exophytic cyst is noted anteriorly in the right kidney.  Moderate stool is present within the rectosigmoid colon.  The remainder of the colon is unremarkable.  Contrast can be seen to the descending colon.  The remainder the colon is unremarkable. The appendix is visualized and normal.  Small bowel is within normal limits. Calcifications and exophytic lesions of the uterus are compatible with multiple fibroids.  The adnexa are within normal limits for age.  A Foley catheter is present within the urinary bladder.  Multiple sclerotic lesions are present throughout the thoracolumbar spine, compatible with metastases.  IMPRESSION:  1.  Extensive sclerotic lesions throughout  the thoracolumbar spine are compatible with metastases are less likely myeloma. 2.  Multiple uterine fibroids. 3.  4.9 x 3.4 cm soft tissue mass versus hematoma anterior to the spleen. 4.  3.5 cm hypodense lesion within the anterior spleen could represent a spontaneous hemorrhage versus a metastasis. 5.  Atherosclerosis. 6.  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   Original Report Authenticated By: Marin Roberts, M.D.    Ct Abdomen Pelvis W Contrast  10/08/2012  *RADIOLOGY REPORT*  Clinical Data:  Hemorrhage.  CT CHEST, ABDOMEN AND PELVIS WITH CONTRAST  Technique:  Multidetector CT imaging of the chest, abdomen and pelvis was performed following the standard protocol during bolus administration of intravenous contrast.  Contrast: OMNIPAQUE IOHEXOL 300 MG/ML  SOLN  Comparison:  Chest x-ray 09/30/2012.  Abdominal ultrasound 09/24/2012.  CT CHEST  Findings:  The heart is mildly enlarged.  Atherosclerotic calcifications are present in the aorta.  Irregularity of the thyroid is present with asymmetric enlargement of the left lobe. No significant mediastinal or axillary adenopathy is present.  A small left pleural effusion is present.  A 6 mm ground-glass nodule is present in the right upper lobe. There is partial collapse of the left lower lobe.  Minimal right basilar atelectasis is present.  The bone windows demonstrate a diffuse sclerotic pattern suggesting multiple myeloma or metastasis.  IMPRESSION:  1.  6 mm ground-glass nodule in the right upper lobe. Adenocarcinoma cannot be  excluded.  Followup by CT is recommended in 12 months, with continued annual surveillance for a minimum of 3 years.  These recommendations are taken from "Recommendations for the Management of Subsolid Pulmonary Nodules Detected at CT:  A Statement from the Fleischner Society" Radiology 2013; 266:1, 304- 317. 2.  Mild cardiomegaly without failure. 3.  Atherosclerosis. 4.  Extensive osseous  sclerotic lesions suggesting diffuse metastatic disease. 4.  Small left pleural effusion. 5.  Partial collapse of the left lower lobe. 6.  Mild right basilar atelectasis.  CT ABDOMEN AND PELVIS  Findings:  Mild fatty infiltration is present in the liver.  No focal hepatic lesions are present.  A soft tissue mass or hemorrhages present anterior to the spleen, measuring 2.9 x 4.9 x 3.4 cm.  There is a hypodense area anteriorly in spleen, adjacent to this which measures 3.5 cm.  The stomach, duodenum, and pancreas are within normal limits.  The adrenal glands are normal bilaterally.  A simple cyst posteriorly in the left kidney measures 2.4 cm.  A large staghorn calculus is present within the left kidney.  There is no evidence for obstruction.  No right-sided stones are present.  A large exophytic cyst is noted anteriorly in the right kidney.  Moderate stool is present within the rectosigmoid colon.  The remainder of the colon is unremarkable.  Contrast can be seen to the descending colon.  The remainder the colon is unremarkable. The appendix is visualized and normal.  Small bowel is within normal limits. Calcifications and exophytic lesions of the uterus are compatible with multiple fibroids.  The adnexa are within normal limits for age.  A Foley catheter is present within the urinary bladder.  Multiple sclerotic lesions are present throughout the thoracolumbar spine, compatible with metastases.  IMPRESSION:  1.  Extensive sclerotic lesions throughout the thoracolumbar spine are compatible with metastases are less likely myeloma. 2.  Multiple uterine fibroids. 3.  4.9 x 3.4 cm soft tissue mass versus hematoma anterior to the spleen. 4.  3.5 cm hypodense lesion within the anterior spleen could represent a spontaneous hemorrhage versus a metastasis. 5.  Atherosclerosis. 6.  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   Original Report Authenticated By: Marin Roberts, M.D.    US  Abdomen Limited  10/10/2012  *RADIOLOGY REPORT*  Clinical Data: Left upper quadrant abdominal pain, possible paraspinal mass seen on recent abdominal CT.  LIMITED ABDOMINAL ULTRASOUND  Comparison:  CT abdomen pelvis - 10/08/2012  Findings:  The spleen is normal in size measuring approximately 8.7 cm in length.  There is an approximately 3.1 x 3.4 x 2.9 cm nearly isoechoic lesion within the anterior aspect of the spleen which correlates with the indeterminate hypoattenuating lesion seen on prior abdominal CT.  Adjacent to the anterior aspect of the spleen is an additional approximately 3.5 x 2.7 x 3.9 cm hypoechoic fluid collection which is without definitive internal blood flow.  IMPRESSION:  Indeterminate approximately 3.4 cm nearly isoechoic lesion within the anterior aspect of the spleen and an approximately 3.9 cm anechoic fluid collection adjacent to the anterior aspect of the spleen both correlate with the findings on recent abdominal CT. While indeterminate, given the minimal amount of adjacent perinephric stranding seen on recent abdominal CT, these findings may be the sequela of recent trauma or (less likely) infection. Clinical correlation is advised.   Original Report Authenticated By: Tacey Ruiz, MD        Assessment/Plan 1. Likely breast carcinoma with metastatic  disease 2. Anemia of chronic disease 3. COPD, O2 dependent  Plan: 1. Will plan to do a Tru-Cut biopsy of this patient's breast mass today.  We have discussed this with the patient.  She is agreeable.  OSBORNE,KELLY E 10/10/2012, 11:08 AM Pager: [phone]  Agree with above. Pleasant lady with bad disease. She is also for a bone biopsy today. Will plan right breast biopsy at bedside later today.  Ovidio Kin, MD, [facility] Surgery Pager: [phone] Office phone:  [phone]

## 2012-10-10 NOTE — Progress Notes (Signed)
[facility] Attending Daily Note: Sara Neal MD [phone] pager office [phone] I  have seen and examined this patient, reviewed their chart. I have discussed this patient with the resident. I agree with the resident's findings, assessment and care plan. 

## 2012-10-10 NOTE — Progress Notes (Signed)
[facility] Progress Note  Subjective: No new complaints  Objective: Vital signs in last 24 hours: Temp:  [97.5 F (36.4 C)-98.1 F (36.7 C)] 98.1 F (36.7 C) (02/14 0445) Pulse Rate:  [69-76] 76 (02/14 0445) Resp:  [18-20] 20 (02/14 0445) BP: (126-135)/(55-66) 135/55 mmHg (02/14 0445) SpO2:  [94 %-100 %] 97 % (02/14 0445) Weight:  [103.8 kg (228 lb 13.4 oz)] 103.8 kg (228 lb 13.4 oz) (02/14 0445) Weight change: -4.7 kg (-10 lb 5.8 oz)   PE: Obese, unchanged  Lab Results: Results for orders placed during the hospital encounter of 10/07/12 (from the past 24 hour(s))  PROTIME-INR     Status: None   Collection Time    10/10/12  5:05 AM      Result Value Range   Prothrombin Time 12.8  11.6 - 15.2 seconds   INR 0.97  0.00 - 1.49    Studies/Results: Ct Chest W Contrast  10/08/2012  *RADIOLOGY REPORT*  Clinical Data:  Hemorrhage.  CT CHEST, ABDOMEN AND PELVIS WITH CONTRAST  Technique:  Multidetector CT imaging of the chest, abdomen and pelvis was performed following the standard protocol during bolus administration of intravenous contrast.  Contrast: OMNIPAQUE IOHEXOL 300 MG/ML  SOLN  Comparison:  Chest x-ray 09/30/2012.  Abdominal ultrasound 09/24/2012.  CT CHEST  Findings:  The heart is mildly enlarged.  Atherosclerotic calcifications are present in the aorta.  Irregularity of the thyroid is present with asymmetric enlargement of the left lobe. No significant mediastinal or axillary adenopathy is present.  A small left pleural effusion is present.  A 6 mm ground-glass nodule is present in the right upper lobe. There is partial collapse of the left lower lobe.  Minimal right basilar atelectasis is present.  The bone windows demonstrate a diffuse sclerotic pattern suggesting multiple myeloma or metastasis.  IMPRESSION:  1.  6 mm ground-glass nodule in the right upper lobe. Adenocarcinoma cannot be excluded.  Followup by CT is recommended in 12 months, with continued annual  surveillance for a minimum of 3 years.  These recommendations are taken from "Recommendations for the Management of Subsolid Pulmonary Nodules Detected at CT:  A Statement from the Fleischner Society" Radiology 2013; 266:1, 304- 317. 2.  Mild cardiomegaly without failure. 3.  Atherosclerosis. 4.  Extensive osseous sclerotic lesions suggesting diffuse metastatic disease. 4.  Small left pleural effusion. 5.  Partial collapse of the left lower lobe. 6.  Mild right basilar atelectasis.  CT ABDOMEN AND PELVIS  Findings:  Mild fatty infiltration is present in the liver.  No focal hepatic lesions are present.  A soft tissue mass or hemorrhages present anterior to the spleen, measuring 2.9 x 4.9 x 3.4 cm.  There is a hypodense area anteriorly in spleen, adjacent to this which measures 3.5 cm.  The stomach, duodenum, and pancreas are within normal limits.  The adrenal glands are normal bilaterally.  A simple cyst posteriorly in the left kidney measures 2.4 cm.  A large staghorn calculus is present within the left kidney.  There is no evidence for obstruction.  No right-sided stones are present.  A large exophytic cyst is noted anteriorly in the right kidney.  Moderate stool is present within the rectosigmoid colon.  The remainder of the colon is unremarkable.  Contrast can be seen to the descending colon.  The remainder the colon is unremarkable. The appendix is visualized and normal.  Small bowel is within normal limits. Calcifications and exophytic lesions of the uterus are compatible with multiple  fibroids.  The adnexa are within normal limits for age.  A Foley catheter is present within the urinary bladder.  Multiple sclerotic lesions are present throughout the thoracolumbar spine, compatible with metastases.  IMPRESSION:  1.  Extensive sclerotic lesions throughout the thoracolumbar spine are compatible with metastases are less likely myeloma. 2.  Multiple uterine fibroids. 3.  4.9 x 3.4 cm soft tissue mass versus  hematoma anterior to the spleen. 4.  3.5 cm hypodense lesion within the anterior spleen could represent a spontaneous hemorrhage versus a metastasis. 5.  Atherosclerosis. 6.  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   Original Report Authenticated By: Marin Roberts, M.D.    Ct Abdomen Pelvis W Contrast  10/08/2012  *RADIOLOGY REPORT*  Clinical Data:  Hemorrhage.  CT CHEST, ABDOMEN AND PELVIS WITH CONTRAST  Technique:  Multidetector CT imaging of the chest, abdomen and pelvis was performed following the standard protocol during bolus administration of intravenous contrast.  Contrast: OMNIPAQUE IOHEXOL 300 MG/ML  SOLN  Comparison:  Chest x-ray 09/30/2012.  Abdominal ultrasound 09/24/2012.  CT CHEST  Findings:  The heart is mildly enlarged.  Atherosclerotic calcifications are present in the aorta.  Irregularity of the thyroid is present with asymmetric enlargement of the left lobe. No significant mediastinal or axillary adenopathy is present.  A small left pleural effusion is present.  A 6 mm ground-glass nodule is present in the right upper lobe. There is partial collapse of the left lower lobe.  Minimal right basilar atelectasis is present.  The bone windows demonstrate a diffuse sclerotic pattern suggesting multiple myeloma or metastasis.  IMPRESSION:  1.  6 mm ground-glass nodule in the right upper lobe. Adenocarcinoma cannot be excluded.  Followup by CT is recommended in 12 months, with continued annual surveillance for a minimum of 3 years.  These recommendations are taken from "Recommendations for the Management of Subsolid Pulmonary Nodules Detected at CT:  A Statement from the Fleischner Society" Radiology 2013; 266:1, 304- 317. 2.  Mild cardiomegaly without failure. 3.  Atherosclerosis. 4.  Extensive osseous sclerotic lesions suggesting diffuse metastatic disease. 4.  Small left pleural effusion. 5.  Partial collapse of the left lower lobe. 6.  Mild right basilar  atelectasis.  CT ABDOMEN AND PELVIS  Findings:  Mild fatty infiltration is present in the liver.  No focal hepatic lesions are present.  A soft tissue mass or hemorrhages present anterior to the spleen, measuring 2.9 x 4.9 x 3.4 cm.  There is a hypodense area anteriorly in spleen, adjacent to this which measures 3.5 cm.  The stomach, duodenum, and pancreas are within normal limits.  The adrenal glands are normal bilaterally.  A simple cyst posteriorly in the left kidney measures 2.4 cm.  A large staghorn calculus is present within the left kidney.  There is no evidence for obstruction.  No right-sided stones are present.  A large exophytic cyst is noted anteriorly in the right kidney.  Moderate stool is present within the rectosigmoid colon.  The remainder of the colon is unremarkable.  Contrast can be seen to the descending colon.  The remainder the colon is unremarkable. The appendix is visualized and normal.  Small bowel is within normal limits. Calcifications and exophytic lesions of the uterus are compatible with multiple fibroids.  The adnexa are within normal limits for age.  A Foley catheter is present within the urinary bladder.  Multiple sclerotic lesions are present throughout the thoracolumbar spine, compatible with metastases.  IMPRESSION:  1.  Extensive sclerotic lesions throughout the thoracolumbar spine are compatible with metastases are less likely myeloma. 2.  Multiple uterine fibroids. 3.  4.9 x 3.4 cm soft tissue mass versus hematoma anterior to the spleen. 4.  3.5 cm hypodense lesion within the anterior spleen could represent a spontaneous hemorrhage versus a metastasis. 5.  Atherosclerosis. 6.  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   Original Report Authenticated By: Marin Roberts, M.D.       Assessment: Anemia with heme positive stools and hemoglobin 5.8 with no recent colonoscopy. Breast mass with suspected involvement of bone  Plan: Patient is  scheduled for CT-guided bone biopsy and possible 3 cut breast biopsy today. She is agreeable) Cymbalta colonoscopy and probable EGD as long as her primary team tell her that it is indicated. She's been hesitant all along one I discussed it with her. Dr. Evette Cristal will be on call this weekend and around and see if we can schedule procedures around workup for breast lesion, probably early next week.    Keyla Milone C 10/10/2012, 7:32 AM

## 2012-10-10 NOTE — Progress Notes (Signed)
Family Medicine Teaching Service Daily Progress Note Service Pager: [phone]  Patient name: Anita [name] Medical record number: [id number] Date of birth: 04-29-1946 Age: 67 y.o. Gender: female  Primary Care Provider: Everlene Other, DO Consultants:   Gastroenterology: Dr. Madilyn Fireman  Oncology: Dr. Gaylyn Rong  General Surgery: [facility]  Interventional Radiology  CODE STATUS: Full  SUBJECTIVE  Feeling okay after bone biopsy.  Ready for breast biopsy.  Willing to undergo colonoscopy prior to d/c  OBJECTIVE  Vitals: Temp:  [97.5 F (36.4 C)-98.1 F (36.7 C)] 98.1 F (36.7 C) (02/14 0445) Pulse Rate:  [72-77] 77 (02/14 1440) Resp:  [14-20] 14 (02/14 1440) BP: (130-141)/(55-83) 141/70 mmHg (02/14 1440) SpO2:  [94 %-100 %] 94 % (02/14 1440) Weight:  [228 lb 13.4 oz (103.8 kg)] 228 lb 13.4 oz (103.8 kg) (02/14 0445)  Weight: Wt Readings from Last 3 Encounters:  10/10/12 228 lb 13.4 oz (103.8 kg)  10/06/12 227 lb 8 oz (103.193 kg)  10/02/12 224 lb 13.9 oz (102 kg)    I&Os: Yesterday: 02/13 0701 - 02/14 0700 In: 480 [P.O.:480] Out: 1550 [Urine:1550] This shift:    PE: GENERAL: Obese caucasian female. In no discomfort; no respiratory distress.  PSYCH: Alert and appropriately interactive; Insight:Good  H&N: AT/[place], trachea midline  EENT: MMM, no scleral icterus, EOMi  HEART: RRR, S1/S2 heard, no murmur  LUNGS: CTA B, no wheezes, no crackles  EXTREMITIES: Moves all 4 extremities spontaneously, warm well perfused, no edema, bilateral DP and PT pulses 2/4.    LABS: CBC BMET   Recent Labs Lab 10/08/12 1625 10/09/12 0655 10/10/12 1100  WBC 9.6 11.4* 8.1  HGB 9.0* 8.9* 8.5*  HCT 27.3* 28.1* 27.2*  PLT 222 215 184     10/10/2012 05:05  Prothrombin Time 12.8  INR 0.97    Recent Labs Lab 10/08/12 0500 10/09/12 0655 10/10/12 1100  NA 138 139 140  K 3.0* 4.5 3.7  CL 94* 98 103  CO2 37* 29 27  BUN 54* 32* 22  CREATININE 1.50* 1.30* 1.15*  GLUCOSE 181* 235* 116*  CALCIUM 8.2*  9.0 9.3     URINE STUDIES: UA and Culture @ admission - unimpressive, no growth  MICRO: Urine Culture, negative  IMAGING:  Ct Chest W Contrast  10/08/2012  *RADIOLOGY REPORT*  Clinical Data:  Hemorrhage.  CT CHEST, ABDOMEN AND PELVIS WITH CONTRAST  Technique:  Multidetector CT imaging of the chest, abdomen and pelvis was performed following the standard protocol during bolus administration of intravenous contrast.  Contrast: OMNIPAQUE IOHEXOL 300 MG/ML  SOLN  Comparison:  Chest x-ray 09/30/2012.  Abdominal ultrasound 09/24/2012.  CT CHEST  Findings:  The heart is mildly enlarged.  Atherosclerotic calcifications are present in the aorta.  Irregularity of the thyroid is present with asymmetric enlargement of the left lobe. No significant mediastinal or axillary adenopathy is present.  A small left pleural effusion is present.  A 6 mm ground-glass nodule is present in the right upper lobe. There is partial collapse of the left lower lobe.  Minimal right basilar atelectasis is present.  The bone windows demonstrate a diffuse sclerotic pattern suggesting multiple myeloma or metastasis.  IMPRESSION:  1.  6 mm ground-glass nodule in the right upper lobe. Adenocarcinoma cannot be excluded.  Followup by CT is recommended in 12 months, with continued annual surveillance for a minimum of 3 years.  These recommendations are taken from "Recommendations for the Management of Subsolid Pulmonary Nodules Detected at CT:  A Statement from the Fleischner  Society" Radiology 2013; 266:1, 304- 317. 2.  Mild cardiomegaly without failure. 3.  Atherosclerosis. 4.  Extensive osseous sclerotic lesions suggesting diffuse metastatic disease. 4.  Small left pleural effusion. 5.  Partial collapse of the left lower lobe. 6.  Mild right basilar atelectasis.  CT ABDOMEN AND PELVIS  Findings:  Mild fatty infiltration is present in the liver.  No focal hepatic lesions are present.  A soft tissue mass or hemorrhages present anterior  to the spleen, measuring 2.9 x 4.9 x 3.4 cm.  There is a hypodense area anteriorly in spleen, adjacent to this which measures 3.5 cm.  The stomach, duodenum, and pancreas are within normal limits.  The adrenal glands are normal bilaterally.  A simple cyst posteriorly in the left kidney measures 2.4 cm.  A large staghorn calculus is present within the left kidney.  There is no evidence for obstruction.  No right-sided stones are present.  A large exophytic cyst is noted anteriorly in the right kidney.  Moderate stool is present within the rectosigmoid colon.  The remainder of the colon is unremarkable.  Contrast can be seen to the descending colon.  The remainder the colon is unremarkable. The appendix is visualized and normal.  Small bowel is within normal limits. Calcifications and exophytic lesions of the uterus are compatible with multiple fibroids.  The adnexa are within normal limits for age.  A Foley catheter is present within the urinary bladder.  Multiple sclerotic lesions are present throughout the thoracolumbar spine, compatible with metastases.  IMPRESSION:  1.  Extensive sclerotic lesions throughout the thoracolumbar spine are compatible with metastases are less likely myeloma. 2.  Multiple uterine fibroids. 3.  4.9 x 3.4 cm soft tissue mass versus hematoma anterior to the spleen. 4.  3.5 cm hypodense lesion within the anterior spleen could represent a spontaneous hemorrhage versus a metastasis. 5.  Atherosclerosis. 6.  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   Original Report Authenticated By: Marin Roberts, M.D.    Ct Abdomen Pelvis W Contrast  10/08/2012  *RADIOLOGY REPORT*  Clinical Data:  Hemorrhage.  CT CHEST, ABDOMEN AND PELVIS WITH CONTRAST  Technique:  Multidetector CT imaging of the chest, abdomen and pelvis was performed following the standard protocol during bolus administration of intravenous contrast.  Contrast: OMNIPAQUE IOHEXOL 300 MG/ML  SOLN   Comparison:  Chest x-ray 09/30/2012.  Abdominal ultrasound 09/24/2012.  CT CHEST  Findings:  The heart is mildly enlarged.  Atherosclerotic calcifications are present in the aorta.  Irregularity of the thyroid is present with asymmetric enlargement of the left lobe. No significant mediastinal or axillary adenopathy is present.  A small left pleural effusion is present.  A 6 mm ground-glass nodule is present in the right upper lobe. There is partial collapse of the left lower lobe.  Minimal right basilar atelectasis is present.  The bone windows demonstrate a diffuse sclerotic pattern suggesting multiple myeloma or metastasis.  IMPRESSION:  1.  6 mm ground-glass nodule in the right upper lobe. Adenocarcinoma cannot be excluded.  Followup by CT is recommended in 12 months, with continued annual surveillance for a minimum of 3 years.  These recommendations are taken from "Recommendations for the Management of Subsolid Pulmonary Nodules Detected at CT:  A Statement from the Fleischner Society" Radiology 2013; 266:1, 304- 317. 2.  Mild cardiomegaly without failure. 3.  Atherosclerosis. 4.  Extensive osseous sclerotic lesions suggesting diffuse metastatic disease. 4.  Small left pleural effusion. 5.  Partial collapse of  the left lower lobe. 6.  Mild right basilar atelectasis.  CT ABDOMEN AND PELVIS  Findings:  Mild fatty infiltration is present in the liver.  No focal hepatic lesions are present.  A soft tissue mass or hemorrhages present anterior to the spleen, measuring 2.9 x 4.9 x 3.4 cm.  There is a hypodense area anteriorly in spleen, adjacent to this which measures 3.5 cm.  The stomach, duodenum, and pancreas are within normal limits.  The adrenal glands are normal bilaterally.  A simple cyst posteriorly in the left kidney measures 2.4 cm.  A large staghorn calculus is present within the left kidney.  There is no evidence for obstruction.  No right-sided stones are present.  A large exophytic cyst is noted  anteriorly in the right kidney.  Moderate stool is present within the rectosigmoid colon.  The remainder of the colon is unremarkable.  Contrast can be seen to the descending colon.  The remainder the colon is unremarkable. The appendix is visualized and normal.  Small bowel is within normal limits. Calcifications and exophytic lesions of the uterus are compatible with multiple fibroids.  The adnexa are within normal limits for age.  A Foley catheter is present within the urinary bladder.  Multiple sclerotic lesions are present throughout the thoracolumbar spine, compatible with metastases.  IMPRESSION:  1.  Extensive sclerotic lesions throughout the thoracolumbar spine are compatible with metastases are less likely myeloma. 2.  Multiple uterine fibroids. 3.  4.9 x 3.4 cm soft tissue mass versus hematoma anterior to the spleen. 4.  3.5 cm hypodense lesion within the anterior spleen could represent a spontaneous hemorrhage versus a metastasis. 5.  Atherosclerosis. 6.  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   Original Report Authenticated By: Marin Roberts, M.D.    US Abdomen Limited  10/10/2012  *RADIOLOGY REPORT*  Clinical Data: Left upper quadrant abdominal pain, possible paraspinal mass seen on recent abdominal CT.  LIMITED ABDOMINAL ULTRASOUND  Comparison:  CT abdomen pelvis - 10/08/2012  Findings:  The spleen is normal in size measuring approximately 8.7 cm in length.  There is an approximately 3.1 x 3.4 x 2.9 cm nearly isoechoic lesion within the anterior aspect of the spleen which correlates with the indeterminate hypoattenuating lesion seen on prior abdominal CT.  Adjacent to the anterior aspect of the spleen is an additional approximately 3.5 x 2.7 x 3.9 cm hypoechoic fluid collection which is without definitive internal blood flow.  IMPRESSION:  Indeterminate approximately 3.4 cm nearly isoechoic lesion within the anterior aspect of the spleen and an approximately  3.9 cm anechoic fluid collection adjacent to the anterior aspect of the spleen both correlate with the findings on recent abdominal CT. While indeterminate, given the minimal amount of adjacent perinephric stranding seen on recent abdominal CT, these findings may be the sequela of recent trauma or (less likely) infection. Clinical correlation is advised.   Original Report Authenticated By: Tacey Ruiz, MD      Medications:    . atorvastatin  40 mg Oral q1800  . calcium citrate  1 tablet Oral TID WC  . fentaNYL      . midazolam      . pantoprazole (PROTONIX) IV  80 mg Intravenous Q12H  . sodium chloride  3 mL Intravenous Q12H   albuterol . sodium chloride 50 mL/hr at 10/07/12 2010     Assessment & Plan  LOS: 47 67 y.o. year old female with recent admit 09/24/2012 - 10/02/2012 for acute  diastolic CHF exacerbation and COPD exacerbation, NSTEMI, anemia, breast mass, leg ulcers, fiborma, CIN lll and electrolyte abnormalities directly admitted from home due to an incidental Hb of 5.8 @ follow up appointment with PCP.  # Likely Metastatic Process: Multiple potential sources including Breast (mass), Uterus (CIN III), Bone (lesions, likely mets), ?GI source given Melana, Lung (nodule on CT, but less likely).   Underwent Bone Biopsy today  To undergo Breast Mass Biopsy later today  Less likely GI source but pt now agreeable to EGD with Colonoscopy.  Plan for likely Sunday/Monday.  Discussed with Dr. Madilyn Fireman.   Dr. Gaylyn Rong Following and appreciate his guidance for any further workup.  Will finish workup/treatment as OP.  # Anemia, severe Acute on chronic: + Melana and likely peri-splenic bleed. S/p 4 units. GI following.   ? Upper vs lower GI source for melana. ?stress ulcer from recent ICU stay. On high dose PPI.   Appreciate plan for colonoscopy & EGD. Per [facility]    # RESP: COPD, OSA, OHS, Home BiPap but non compliant, O2 Dependent  Stable, continue O2 after trying to d/c yesterday pt persistently  hypoxia   # Hypokalemia: improved  # Diastolic CHF: some volume overload with blood products, no new pulmonary symptoms  # DM2:   SSI   --- FEN  *SL -Carb Modified after biopsy --- PPx: Protonix 80mg  q12   Disposition  Pt undergoing metastatic workup.  Will plan for colonoscopy either Sunday or Monday.  Plan d/c early next week.  Will need op follow up with Dr. Gaylyn Rong.  Appreciate all specialists involvement   Andrena Mews, DO [facility] Resident - PGY-1 10/10/2012 2:59 PM

## 2012-10-10 NOTE — Progress Notes (Signed)
[facility]  Patient Status: Active (receiving services up to time of hospitalization)  [facility] is providing the following services: RN and PT  If patient discharges after hours, please call [phone].   [name] 10/10/2012, 10:57 AM

## 2012-10-10 NOTE — Op Note (Signed)
*   No surgery found *  5:07 PM  PATIENT:  Anita [name], 67 y.o., female, MRN: [id number]  PREOP DIAGNOSIS:  Right breast cancer  POSTOP DIAGNOSIS:   Right breast cancer  PROCEDURE:   Core biopsy of right breast with 14 gauge tru cut needle x 2.  SURGEON:   Ovidio Kin, M.D.  ASSISTANT:   Aundra Millet Dort, PA  ANESTHESIA:   local  EBL:  minimal  ml  LOCAL MEDICATIONS USED:   3 cc 2% xylocaine  SPECIMEN:   2 core biopsies of right breast  INDICATIONS FOR PROCEDURE:  Anita [name] is a 67 y.o. (DOB: 14-Dec-1945) white  female whose primary care physician is Everlene Other, DO and is in room 2020 at [facility].  She has a large right breast mass with multiple osseous lesions suspicious for metastatic disease.   The indications and risks of the surgery were explained to the patient.  The risks include, but are not limited to, infection, bleeding, and nerve injury.  Procedure: The patient is in her own bed.  A time out was held.  Her right breast was painted with betadine.  The skin was infiltrated with 3 cc of 2% xylocaine.  A puncture incision was made in the skin and a 14 gauge Tru Cut needle was used to obtain 2 core biopsies of the right breast mass.  The wound was sterilely dressed.  She tolerated the procedure well.  Pathology is pending.  Ovidio Kin, MD, [facility] Surgery Pager: [phone] Office phone:  [phone]

## 2012-10-10 NOTE — Discharge Summary (Signed)
Family Medicine Teaching [facility] Discharge Summary  Patient name: Anita [name] Medical record number: [id number] Date of birth: Aug 28, 1945 Age: 67 y.o. Gender: female Date of Admission: 10/07/2012  Date of Discharge: 10/14/2012 Admitting Physician: Nestor Ramp, MD  Primary Care Provider: Everlene Other, DO  Indication for Hospitalization: 67 yo F w/ complicated PMHx including recent admit 09/24/2012 - 10/02/2012 for acute diastolic CHF exacerbation and COPD exacerbation, NSTEMI, anemia, breast mass, leg ulcers, fiborma, CIN lll and electrolyte abnormalities presents with low Hg of 5.4 g/dL via clinic results from home.  Consultants: Oncology, Dr. Gaylyn Rong GI, Dr. Evette Cristal Surgery, Dr. Ezzard Standing  Discharge Physical Exam: Gen: NAD  CV: rrr, no mrg  Pulm: CTAB, no wheezes  Abd: s, NT, ND  Ext: no edema  Discharge Diagnoses:  1. Metastatic mammary carcinoma 2. Anemia w/ Melana 3. CKD Stage lll 4. Diastolic CHF 5. COPD 6. OSA 7. DM ll  Brief Hospital Course:   1. Anemia w/ Melana The patient had outpatient blood work done which revealed a Hgb of 5.8. The patient was admitted to the hospital on 10/07/2012 and had 2 U blood transfused which brought her Hg up to 6.4 g/mL the following morning. The patient had been experiencing Melana x 3 days prior to admit and was found hemoccult +. Additionally had diarrhea related to blood in stools. 2 more U of blood transfused on 10/08/2012 to a Hg of 9.0. Hg was 8.9 the following day in the morning. Patient was placed on IV protonix on admission. GI was consulted and decided to proceed with EGD and colonoscopy as inpatient. These revealed no source of bleeding. Her Hgb was stable at time of discharge at 8.9.  2. Metastatic Mammary carcinoma Patient with history of CIN 3 in 10/12 with no treatment and recent 7x9 cm R breast mass noted on previous admission. CT scan obtained during this hospitalization suggested extensive sclerotic lesions throughout the  thoracolumbar spine compatible with metastases are less likely myeloma and 3.5 cm hypodense lesion within the anterior spleen could represent a spontaneous hemorrhage versus a metastasis. The patient was seen by oncologist Dr. Gaylyn Rong who suggested to use IR for bone lesion bx and Dr. Ovidio Kin agreed to do a bedside core biopsy of the pt's breast mass. Biopsies revealed metastatic mammary carcinoma. Dr. Gaylyn Rong arranged for outpatient follow-up to finish work up and discuss prognosis and treatment plan. Patient had MRI brain without contrast and nuclear bone scan scheduled as outpatient.  3. Hypokalemia Persistently low potassium since last admission. This was managed with potassium supplementation orally throughout the admission. Magnesium was checked and found to be 1.9. Was given 80 mEq on day of discharge for potassium of 2.9. Will need f/u labs drawn at outpatient f/u.  3. CKD Stage lll The patient's baseline Cr is 1.0 to 1.5 mg/dL. Patient initially had improvement in renal function to 1.15, but subsequently had increase to 1.89. No evidence of hydronephrosis on CT abd. Patient was noted to have staghorn calculi. Patient additionally had been on torsemide 40 mg BID. Over diuresis could have contributed to worsening renal function given patient is diuretic naive. Torsemide was decreased to 20 mg daily.  4. Diastolic CHF Patient with some evidence of volume overload following receiving blood products. Was continued on torsemide and had improvement in respiratory status.  5. Resp: COPD, OSA, OHS Patient with BiPAP at home following previous admission and is non-compliant. Additionally required home levels of oxygen with 2 L [place]. She was stable from  this standpoint.  6. DM ll Recent HbA1c of 6.6. Blood sugars mostly in the 100's throughout admission. No SSI was ordered.  Consultations: Gastroenterology- Dr. Barrie Folk. Oncology- Dr. Exie Parody.  Procedures:  Core bx of breast mass- 10/10/2012  CT  guided bone bx- 10/10/2012   Significant Imaging:   CT Chest- 10/08/2012  IMPRESSION:  1. 6 mm ground-glass nodule in the right upper lobe.  Adenocarcinoma cannot be excluded. Followup by CT is recommended  in 12 months, with continued annual surveillance for a minimum of 3  years.  These recommendations are taken from "Recommendations for the  Management of Subsolid Pulmonary Nodules Detected at CT: A  Statement from the Fleischner Society" Radiology 2013; 266:1, 304-  317.  2. Mild cardiomegaly without failure.  3. Atherosclerosis.  4. Extensive osseous sclerotic lesions suggesting diffuse  metastatic disease.  4. Small left pleural effusion.  5. Partial collapse of the left lower lobe   CT Abdomen/Pelvis- 10/08/2012   IMPRESSION:  1. Extensive sclerotic lesions throughout the thoracolumbar spine  are compatible with metastases are less likely myeloma.  2. Multiple uterine fibroids.  3. 4.9 x 3.4 cm soft tissue mass versus hematoma anterior to the  spleen.  4. 3.5 cm hypodense lesion within the anterior spleen could  represent a spontaneous hemorrhage versus a metastasis.  5. Atherosclerosis.  6. Staghorn calculus of the left kidney without evidence for  Obstruction.  LUQ [place]- 10/10/2012 Indeterminate approximately 3.4 cm nearly isoechoic lesion within  the anterior aspect of the spleen and an approximately 3.9 cm  anechoic fluid collection adjacent to the anterior aspect of the  spleen both correlate with the findings on recent abdominal CT.  While indeterminate, given the minimal amount of adjacent  perinephric stranding seen on recent abdominal CT, these findings  may be the sequela of recent trauma or (less likely) infection.  Clinical correlation is advised.   Discharge Medications:    Medication List    TAKE these medications       albuterol 108 (90 BASE) MCG/ACT inhaler  Commonly known as:  PROVENTIL HFA;VENTOLIN HFA  Inhale 1 puff into the lungs 2 (two)  times daily as needed for wheezing or shortness of breath.     aspirin 81 MG EC tablet  Take 1 tablet (81 mg total) by mouth daily.     atorvastatin 40 MG tablet  Commonly known as:  LIPITOR  Take 1 tablet (40 mg total) by mouth daily at 6 PM.     CALCITRATE PO  Take 1 tablet by mouth daily.     pantoprazole 40 MG tablet  Commonly known as:  PROTONIX  Take 1 tablet (40 mg total) by mouth daily.     torsemide 20 MG tablet  Commonly known as:  DEMADEX  Take 1 tablet (20 mg total) by mouth daily.        Issues for Follow Up: 1. Renal dysfunction: will need to f/u Cr following decrease in torsemide to ensure that renal function is improving with this 2. Anemia: will need f/u Hgb to make sure is stable following discharge. 3. Hypokalemia: will need f/u BMET and possible potassium supplement given persistent hypokalemia 4. Ensure patient f/u with Dr. Gaylyn Rong and has additional imaging work up per oncologist  Discharge Condition: Stable.  Discharge Instructions: Please refer to Patient Instructions section of EMR for full details.  Patient was counseled important signs and symptoms that should prompt return to medical care, changes in medications, dietary instructions,  activity restrictions, and follow up appointments. Please make sure to follow up with oncologist and PCP within the next week.  Marikay Alar, MD 10/15/12 6:00 pm

## 2012-10-11 LAB — CBC
Hemoglobin: 8.9 g/dL — ABNORMAL LOW (ref 12.0–15.0)
MCHC: 31.4 g/dL (ref 30.0–36.0)
MCV: 87.6 fL (ref 78.0–100.0)
Platelets: 180 10*3/uL (ref 150–400)
RBC: 3.23 MIL/uL — ABNORMAL LOW (ref 3.87–5.11)
RDW: 20.1 % — ABNORMAL HIGH (ref 11.5–15.5)
WBC: 6.8 10*3/uL (ref 4.0–10.5)

## 2012-10-11 LAB — GLUCOSE, CAPILLARY
Glucose-Capillary: 137 mg/dL — ABNORMAL HIGH (ref 70–99)
Glucose-Capillary: 166 mg/dL — ABNORMAL HIGH (ref 70–99)

## 2012-10-11 LAB — BASIC METABOLIC PANEL
BUN: 21 mg/dL (ref 6–23)
GFR calc Af Amer: 48 mL/min — ABNORMAL LOW (ref >90)
GFR calc non Af Amer: 41 mL/min — ABNORMAL LOW (ref >90)
Potassium: 4.1 mEq/L (ref 3.5–5.1)
Sodium: 137 mEq/L (ref 135–145)

## 2012-10-11 MED ORDER — TORSEMIDE 20 MG PO TABS
40.0000 mg | ORAL_TABLET | Freq: Two times a day (BID) | ORAL | Status: DC
  Administered 2012-10-11 – 2012-10-14 (×7): 40 mg via ORAL
  Filled 2012-10-11 (×9): qty 2

## 2012-10-11 NOTE — Progress Notes (Signed)
Family Medicine Teaching Service Daily Progress Note Service Pager: [phone]  Patient name: Anita [name] Medical record number: [id number] Date of birth: 27-May-1946 Age: 67 y.o. Gender: female  Primary Care Provider: Everlene Other, DO Consultants:   Gastroenterology: Dr. Madilyn Fireman  Oncology: Dr. Gaylyn Rong  General Surgery: [facility]  Interventional Radiology  CODE STATUS: Full  SUBJECTIVE  S/p bone and breast biopsy today; Patient denies pain, nausea and vomiting; Still agreeable to GI work up  OBJECTIVE  Vitals: Temp:  [97.9 F (36.6 C)] 97.9 F (36.6 C) (02/15 0441) Pulse Rate:  [74-95] 76 (02/15 0441) Resp:  [14-18] 17 (02/15 0441) BP: (131-150)/(58-83) 150/58 mmHg (02/15 0441) SpO2:  [94 %-99 %] 99 % (02/15 0441) Weight:  [230 lb 6.4 oz (104.509 kg)] 230 lb 6.4 oz (104.509 kg) (02/15 0441)  Weight: Wt Readings from Last 3 Encounters:  10/11/12 230 lb 6.4 oz (104.509 kg)  10/06/12 227 lb 8 oz (103.193 kg)  10/02/12 224 lb 13.9 oz (102 kg)    I&Os: Yesterday: 02/14 0701 - 02/15 0700 In: 1803 [P.O.:1800; I.V.:3] Out: 850 [Urine:850] This shift:    PE: GENERAL: Obese caucasian female. In no discomfort; no respiratory distress.  PSYCH: Alert and appropriately interactive; Insight:Good  H&N: AT/[place], trachea midline  EENT: MMM, no scleral icterus, EOMi  HEART: RRR, S1/S2 heard, no murmur  LUNGS: CTA B, no wheezes, no crackles  Chest: large fungating right breast mass, biopsy site without erythema, edema or drainage Back: left posterior iliac biopsy site without erythema, edema, or drainage EXTREMITIES: Moves all 4 extremities spontaneously, warm well perfused, no edema, bilateral DP and PT pulses 2/4.    LABS: CBC BMET   Recent Labs Lab 10/09/12 0655 10/10/12 1100 10/11/12 0600  WBC 11.4* 8.1 6.8  HGB 8.9* 8.5* 8.9*  HCT 28.1* 27.2* 28.3*  PLT 215 184 180     10/10/2012 05:05  Prothrombin Time 12.8  INR 0.97    Recent Labs Lab 10/09/12 0655 10/10/12 1100  10/11/12 0600  NA 139 140 137  K 4.5 3.7 4.1  CL 98 103 101  CO2 29 27 26   BUN 32* 22 21  CREATININE 1.30* 1.15* 1.32*  GLUCOSE 235* 116* 157*  CALCIUM 9.0 9.3 8.7     URINE STUDIES: UA and Culture @ admission - unimpressive, no growth  MICRO: Urine Culture, negative  IMAGING:  US Abdomen Limited  10/10/2012  *RADIOLOGY REPORT*  Clinical Data: Left upper quadrant abdominal pain, possible paraspinal mass seen on recent abdominal CT.  LIMITED ABDOMINAL ULTRASOUND  Comparison:  CT abdomen pelvis - 10/08/2012  Findings:  The spleen is normal in size measuring approximately 8.7 cm in length.  There is an approximately 3.1 x 3.4 x 2.9 cm nearly isoechoic lesion within the anterior aspect of the spleen which correlates with the indeterminate hypoattenuating lesion seen on prior abdominal CT.  Adjacent to the anterior aspect of the spleen is an additional approximately 3.5 x 2.7 x 3.9 cm hypoechoic fluid collection which is without definitive internal blood flow.  IMPRESSION:  Indeterminate approximately 3.4 cm nearly isoechoic lesion within the anterior aspect of the spleen and an approximately 3.9 cm anechoic fluid collection adjacent to the anterior aspect of the spleen both correlate with the findings on recent abdominal CT. While indeterminate, given the minimal amount of adjacent perinephric stranding seen on recent abdominal CT, these findings may be the sequela of recent trauma or (less likely) infection. Clinical correlation is advised.   Original Report Authenticated By: Simonne Come  V, MD    Ct Biopsy  10/10/2012  *RADIOLOGY REPORT*  Indication: History of palpable breast mass, now with CT findings compatible with diffuse osseous metastatic disease.  CT GUIDED LEFT ILIAC BONE LESION CORE BIOPSY  Intravenous medications: Fentanyl 75 mcg IV; Versed 2 mg IV  Sedation time: 18 minutes  Contrast volume: None  Complications: None immediate  PROCEDURE/FINDINGS:  Informed consent was obtained from  the patient following an explanation of the procedure, risks, benefits and alternatives. The patient understands, agrees and consents for the procedure. All questions were addressed.  A time out was performed prior to the initiation of the procedure.  The patient was positioned prone and noncontrast localization CT was performed of the pelvis to demonstrate the known mixed lytic and sclerotic lesions encompassing near the entirety of the left iliac crest.  The operative site was prepped and draped in the usual sterile fashion.  Under sterile conditions and local anesthesia, an 11 gauge coaxial bone biopsy needle was advanced into the left iliac marrow space. Needle position was confirmed with CT imaging.  A bone marrow biopsy was obtained with the 11 gauge outer bone marrow device. Samples were placed in formalin and deemed adequate.  The needle was removed intact.  Hemostasis was obtained with compression and a dressing was placed. The patient tolerated the procedure well without immediate post procedural complication.  IMPRESSION:  Successful CT guided left iliac bone lesion core biopsy.   Original Report Authenticated By: Tacey Ruiz, MD      Medications:    . atorvastatin  40 mg Oral q1800  . calcium citrate  1 tablet Oral TID WC  . pantoprazole (PROTONIX) IV  80 mg Intravenous Q12H  . sodium chloride  3 mL Intravenous Q12H   albuterol . sodium chloride 50 mL/hr at 10/07/12 2010     Assessment & Plan  LOS: 56 67 y.o. year old female with recent admit 09/24/2012 - 10/02/2012 for acute diastolic CHF exacerbation and COPD exacerbation, NSTEMI, anemia, breast mass, leg ulcers, fiborma, CIN lll and electrolyte abnormalities directly admitted from home due to an incidental Hb of 5.8 @ follow up appointment with PCP.  # Likely Metastatic Process: Multiple potential sources including Breast (mass), Uterus (CIN III), Bone (lesions, likely mets), ?GI source given Melana, Lung (nodule on CT, but less  likely).  - Follow up pathology of bone and breast biopsies from 10/09/12 - Less likely GI source but pt now agreeable to EGD with Colonoscopy.  Plan for likely Sunday/Monday.  Discussed with Dr. Madilyn Fireman.  - Dr. Gaylyn Rong Following and appreciate his guidance for any further workup.  Will finish workup/treatment as OP.  # Anemia, severe Acute on chronic: + Melana and likely peri-splenic bleed. upper vs lower GI bleed? S/p 4 units. GI following.   ? Upper vs lower GI source for melana. ?stress ulcer from recent ICU stay. On high dose PPI.   Appreciate plan for colonoscopy & EGD. Per [facility]    # RESP: COPD, OSA, OHS, Home BiPap but non compliant, O2 Dependent  Stable, continue O2 after trying to d/c yesterday pt persistently hypoxia   # Hypokalemia: improved  # Diastolic CHF: some volume overload with blood products, no new pulmonary symptoms  # DM2:   SSI   --- FEN  *SL -Carb Modified after biopsy --- PPx: Protonix 80mg  q12   Disposition  Pt undergoing metastatic workup.  Will plan for colonoscopy either Sunday or Monday.  Plan d/c early next week.  Will need op follow up with Dr. Gaylyn Rong.  Appreciate all specialists involvement   Garnetta Buddy, MD Pager [phone]  10/11/2012 7:20 AM

## 2012-10-11 NOTE — Progress Notes (Signed)
Pt is refusing to wear CPAP states that she is breathing fine and does not want to wear the mask. Vitals are stable HR is 75 RR 18 Oxygen saturation on 2 LPM [place] is 96%. Pt says that if she changes her mind she will call RT. RT will continue to monitor.

## 2012-10-11 NOTE — Progress Notes (Signed)
FMTS Attending Daily Note: Anita Whittenberg MD [phone] pager office [phone] I  have seen and examined this patient, reviewed their chart. I have discussed this patient with the resident. I agree with the resident's findings, assessment and care plan. 

## 2012-10-11 NOTE — Progress Notes (Signed)
No complaints voice today. Discussed EGD and colonoscopy. Patient is agreeable. We will plan to set this up for Monday.

## 2012-10-12 LAB — GLUCOSE, CAPILLARY
Glucose-Capillary: 149 mg/dL — ABNORMAL HIGH (ref 70–99)
Glucose-Capillary: 122 mg/dL — ABNORMAL HIGH (ref 70–99)
Glucose-Capillary: 133 mg/dL — ABNORMAL HIGH (ref 70–99)

## 2012-10-12 LAB — BASIC METABOLIC PANEL
BUN: 23 mg/dL (ref 6–23)
Chloride: 96 mEq/L (ref 96–112)
Creatinine, Ser: 1.46 mg/dL — ABNORMAL HIGH (ref 0.50–1.10)
GFR calc Af Amer: 42 mL/min — ABNORMAL LOW (ref >90)

## 2012-10-12 LAB — CBC
HCT: 32.1 % — ABNORMAL LOW (ref 36.0–46.0)
MCH: 27.7 pg (ref 26.0–34.0)
MCV: 85.4 fL (ref 78.0–100.0)
RDW: 19.3 % — ABNORMAL HIGH (ref 11.5–15.5)
WBC: 6.3 10*3/uL (ref 4.0–10.5)

## 2012-10-12 LAB — CLOSTRIDIUM DIFFICILE BY PCR: Toxigenic C. Difficile by PCR: NEGATIVE

## 2012-10-12 MED ORDER — SODIUM CHLORIDE 0.9 % IV SOLN
INTRAVENOUS | Status: DC
  Administered 2012-10-13: 10:00:00 via INTRAVENOUS

## 2012-10-12 MED ORDER — OXYCODONE-ACETAMINOPHEN 5-325 MG PO TABS: 1.0000 | ORAL_TABLET | ORAL | Status: DC | PRN

## 2012-10-12 MED ORDER — PEG 3350-KCL-NA BICARB-NACL 420 G PO SOLR
4000.0000 mL | Freq: Once | ORAL | Status: AC
  Administered 2012-10-12: 4000 mL via ORAL
  Filled 2012-10-12: qty 4000

## 2012-10-12 MED ORDER — ACETAMINOPHEN 325 MG PO TABS
650.0000 mg | ORAL_TABLET | Freq: Four times a day (QID) | ORAL | Status: DC | PRN
  Filled 2012-10-12: qty 2

## 2012-10-12 MED ORDER — OXYCODONE-ACETAMINOPHEN 5-325 MG PO TABS
1.0000 | ORAL_TABLET | ORAL | Status: DC | PRN
  Administered 2012-10-12 – 2012-10-14 (×2): 1 via ORAL
  Filled 2012-10-12 (×2): qty 1

## 2012-10-12 NOTE — Progress Notes (Signed)
Family Medicine Teaching Service Daily Progress Note Service Pager: [phone]  Patient name: Anita [name] Medical record number: [id number] Date of birth: 19-Feb-1946 Age: 67 y.o. Gender: female  Primary Care Provider: Everlene Other, DO Consultants:   Gastroenterology: [facility]  Oncology: Dr. Lorella Nimrod Surgery: [facility]  Interventional Radiology  CODE STATUS: Full  SUBJECTIVE  Feeling much better.  Diarrhea improved.  Has not started PREP yet.  ? About foley catheter coming out   OBJECTIVE  Vitals: Temp:  [97.8 F (36.6 C)-98.2 F (36.8 C)] 97.8 F (36.6 C) (02/16 0444) Pulse Rate:  [78-93] 93 (02/16 0444) Resp:  [16-18] 18 (02/16 0444) BP: (135-136)/(54-87) 135/87 mmHg (02/16 0444) SpO2:  [97 %-100 %] 97 % (02/16 0444) Weight:  [223 lb (101.152 kg)] 223 lb (101.152 kg) (02/16 0444)  Weight: Wt Readings from Last 3 Encounters:  10/12/12 223 lb (101.152 kg)  10/06/12 227 lb 8 oz (103.193 kg)  10/02/12 224 lb 13.9 oz (102 kg)    I&Os: Yesterday: 02/15 0701 - 02/16 0700 In: 660 [P.O.:660] Out: 4400 [Urine:4400] This shift:    PE: GENERAL: Obese caucasian female. In no discomfort; no respiratory distress.  PSYCH: Alert and appropriately interactive; Insight:Good  H&N: AT/[place], trachea midline  EENT: MMM, no scleral icterus, EOMi  HEART: RRR, S1/S2 heard, no murmur  LUNGS: CTA B, no wheezes, no crackles  Chest: large fungating right breast mass, biopsy site without erythema, edema or drainage Back: left posterior iliac biopsy site without erythema, edema, or drainage EXTREMITIES: Moves all 4 extremities spontaneously, warm well perfused, no edema, bilateral DP and PT pulses 2/4.    LABS: CBC BMET   Recent Labs Lab 10/10/12 1100 10/11/12 0600 10/12/12 0535  WBC 8.1 6.8 6.3  HGB 8.5* 8.9* 10.4*  HCT 27.2* 28.3* 32.1*  PLT 184 180 157     10/10/2012 05:05  Prothrombin Time 12.8  INR 0.97    Recent Labs Lab 10/10/12 1100 10/11/12 0600 10/12/12 0535  NA  140 137 136  K 3.7 4.1 3.1*  CL 103 101 96  CO2 27 26 27   BUN 22 21 23   CREATININE 1.15* 1.32* 1.46*  GLUCOSE 116* 157* 166*  CALCIUM 9.3 8.7 9.2     URINE STUDIES: UA and Culture @ admission - unimpressive, no growth  MICRO: Urine Culture, negative  SURGICAL PATHOLOGY: Bone Biopsy >>PENDING Excisional Breast Biopsy >> PENDING  IMAGING:  US Abdomen Limited  10/10/2012  *RADIOLOGY REPORT*  Clinical Data: Left upper quadrant abdominal pain, possible paraspinal mass seen on recent abdominal CT.  LIMITED ABDOMINAL ULTRASOUND  Comparison:  CT abdomen pelvis - 10/08/2012  Findings:  The spleen is normal in size measuring approximately 8.7 cm in length.  There is an approximately 3.1 x 3.4 x 2.9 cm nearly isoechoic lesion within the anterior aspect of the spleen which correlates with the indeterminate hypoattenuating lesion seen on prior abdominal CT.  Adjacent to the anterior aspect of the spleen is an additional approximately 3.5 x 2.7 x 3.9 cm hypoechoic fluid collection which is without definitive internal blood flow.  IMPRESSION:  Indeterminate approximately 3.4 cm nearly isoechoic lesion within the anterior aspect of the spleen and an approximately 3.9 cm anechoic fluid collection adjacent to the anterior aspect of the spleen both correlate with the findings on recent abdominal CT. While indeterminate, given the minimal amount of adjacent perinephric stranding seen on recent abdominal CT, these findings may be the sequela of recent trauma or (less likely) infection. Clinical correlation is  advised.   Original Report Authenticated By: Tacey Ruiz, MD    Ct Biopsy  10/10/2012  *RADIOLOGY REPORT*  Indication: History of palpable breast mass, now with CT findings compatible with diffuse osseous metastatic disease.  CT GUIDED LEFT ILIAC BONE LESION CORE BIOPSY  Intravenous medications: Fentanyl 75 mcg IV; Versed 2 mg IV  Sedation time: 18 minutes  Contrast volume: None  Complications: None  immediate  PROCEDURE/FINDINGS:  Informed consent was obtained from the patient following an explanation of the procedure, risks, benefits and alternatives. The patient understands, agrees and consents for the procedure. All questions were addressed.  A time out was performed prior to the initiation of the procedure.  The patient was positioned prone and noncontrast localization CT was performed of the pelvis to demonstrate the known mixed lytic and sclerotic lesions encompassing near the entirety of the left iliac crest.  The operative site was prepped and draped in the usual sterile fashion.  Under sterile conditions and local anesthesia, an 11 gauge coaxial bone biopsy needle was advanced into the left iliac marrow space. Needle position was confirmed with CT imaging.  A bone marrow biopsy was obtained with the 11 gauge outer bone marrow device. Samples were placed in formalin and deemed adequate.  The needle was removed intact.  Hemostasis was obtained with compression and a dressing was placed. The patient tolerated the procedure well without immediate post procedural complication.  IMPRESSION:  Successful CT guided left iliac bone lesion core biopsy.   Original Report Authenticated By: Tacey Ruiz, MD      Medications:    . atorvastatin  40 mg Oral q1800  . calcium citrate  1 tablet Oral TID WC  . pantoprazole (PROTONIX) IV  80 mg Intravenous Q12H  . sodium chloride  3 mL Intravenous Q12H  . torsemide  40 mg Oral BID   albuterol, oxyCODONE-acetaminophen . sodium chloride Stopped (10/11/12 1205)     Assessment & Plan  LOS: 39 67 y.o. year old female with recent admit 09/24/2012 - 10/02/2012 for acute diastolic CHF exacerbation and COPD exacerbation, NSTEMI, anemia, breast mass, leg ulcers, fiborma, CIN lll and electrolyte abnormalities directly admitted from home due to an incidental Hb of 5.8 @ follow up appointment with PCP.  # Likely Metastatic Process: Multiple potential sources including  Breast (mass), Uterus (CIN III), Bone (lesions, likely mets), ?GI source given Melana, Lung (nodule on CT, but less likely).  - Follow up pathology of bone and breast biopsies from 10/09/12 - Less likely GI source but pt now agreeable to EGD with Colonoscopy.  Plan for likely Monday.  Per Dr. Evette Cristal - Dr. Gaylyn Rong Following and appreciate his guidance for any further workup.  Will finish workup/treatment as OP.  # Anemia, severe Acute on chronic: + Melana and likely peri-splenic bleed. upper vs lower GI bleed? S/p 4 units. GI following.   ? Upper vs lower GI source for melana. ?stress ulcer from recent ICU stay. On high dose PPI.   Appreciate plan for colonoscopy & EGD. Per [facility]    # Diarrhea: C.Diff Pending.  melana likely attributing.  Not started PREP yet  # RESP: COPD, OSA, OHS, Home BiPap but non compliant, O2 Dependent  Stable, continue O2 after trying to d/c and pt persistently hypoxic  # Hypokalemia: improved  # Diastolic CHF: some volume overload with blood products, no new pulmonary symptoms  # DM2:   SSI   --- FEN  *SL -Carb Modified after biopsy --- PPx:  Protonix 80mg  q12   Disposition  Keep Foley until after colonoscopy. Pt undergoing metastatic workup.  Will plan for colonoscopy on Monday.  Plan d/c late Monday vs Tuesday.  Will need op follow up with Dr. Gaylyn Rong.  Appreciate all specialists involvement   Andrena Mews, DO Pager [phone]  10/12/2012 8:39 AM

## 2012-10-12 NOTE — Progress Notes (Signed)
[facility] Attending Daily Note: Sara Neal MD [phone] pager office [phone] I  have seen and examined this patient, reviewed their chart. I have discussed this patient with the resident. I agree with the resident's findings, assessment and care plan. 

## 2012-10-12 NOTE — Progress Notes (Addendum)
FMTS Interval Progress Note  Received call from nurse that patient was complaining of new onset diarrhea and abdominal pain. BMs described as soft and mushy. Nurse sent C diff pcr. Patient asking for imodium and something for pain. Advised imodium is not a good idea until we know if she has C diff. Patient declined percocet for pain, stating she wanted something less strong.   Abd exam: soft, TTP mostly over epigastrium, guarding present with deep palpation, no rebound noted  Plan: will await C diff results, tylenol prn for pain  Marikay Alar, MD FMTS PGY1  Addendum: received page from nurse stating that patient would rather have 1 percocet than take 2 tylenol. Changed order to reflect this request.  Marikay Alar, MD

## 2012-10-12 NOTE — Progress Notes (Signed)
EGD and colonoscopy were discussed today again with the patient. We will plan to set this up for tomorrow.

## 2012-10-13 ENCOUNTER — Encounter (HOSPITAL_COMMUNITY): Payer: Self-pay

## 2012-10-13 ENCOUNTER — Encounter (HOSPITAL_COMMUNITY)
Admission: AD | Disposition: A | Discharge status: Home / Self Care | Payer: Self-pay | Source: Ambulatory Visit | Attending: Family Medicine

## 2012-10-13 DIAGNOSIS — D62 Acute posthemorrhagic anemia: Secondary | ICD-10-CM

## 2012-10-13 DIAGNOSIS — D649 Anemia, unspecified: Principal | ICD-10-CM

## 2012-10-13 DIAGNOSIS — C801 Malignant (primary) neoplasm, unspecified: Secondary | ICD-10-CM

## 2012-10-13 DIAGNOSIS — N63 Unspecified lump in unspecified breast: Secondary | ICD-10-CM

## 2012-10-13 DIAGNOSIS — C7952 Secondary malignant neoplasm of bone marrow: Secondary | ICD-10-CM

## 2012-10-13 DIAGNOSIS — M949 Disorder of cartilage, unspecified: Secondary | ICD-10-CM

## 2012-10-13 DIAGNOSIS — C50919 Malignant neoplasm of unspecified site of unspecified female breast: Secondary | ICD-10-CM

## 2012-10-13 DIAGNOSIS — E119 Type 2 diabetes mellitus without complications: Secondary | ICD-10-CM

## 2012-10-13 HISTORY — PX: COLONOSCOPY, ESOPHAGOGASTRODUODENOSCOPY (EGD) AND ESOPHAGEAL DILATION: SHX5781

## 2012-10-13 LAB — BASIC METABOLIC PANEL WITH GFR
BUN: 21 mg/dL (ref 6–23)
CO2: 29 meq/L (ref 19–32)
Calcium: 8.5 mg/dL (ref 8.4–10.5)
Chloride: 97 meq/L (ref 96–112)
Creatinine, Ser: 1.72 mg/dL — ABNORMAL HIGH (ref 0.50–1.10)
GFR calc Af Amer: 35 mL/min — ABNORMAL LOW
GFR calc non Af Amer: 30 mL/min — ABNORMAL LOW
Glucose, Bld: 165 mg/dL — ABNORMAL HIGH (ref 70–99)
Potassium: 3.4 meq/L — ABNORMAL LOW (ref 3.5–5.1)
Sodium: 139 meq/L (ref 135–145)

## 2012-10-13 LAB — CBC
MCH: 28 pg (ref 26.0–34.0)
MCHC: 32.2 g/dL (ref 30.0–36.0)
MCV: 86.7 fL (ref 78.0–100.0)
Platelets: 143 10*3/uL — ABNORMAL LOW (ref 150–400)
RDW: 19.5 % — ABNORMAL HIGH (ref 11.5–15.5)

## 2012-10-13 LAB — CLOSTRIDIUM DIFFICILE BY PCR: Toxigenic C. Difficile by PCR: NEGATIVE

## 2012-10-13 LAB — GLUCOSE, CAPILLARY
Glucose-Capillary: 128 mg/dL — ABNORMAL HIGH (ref 70–99)
Glucose-Capillary: 107 mg/dL — ABNORMAL HIGH (ref 70–99)
Glucose-Capillary: 147 mg/dL — ABNORMAL HIGH (ref 70–99)

## 2012-10-13 SURGERY — COLONOSCOPY, ESOPHAGOGASTRODUODENOSCOPY (EGD) AND ESOPHAGEAL DILATION (ED)
Anesthesia: Moderate Sedation

## 2012-10-13 MED ORDER — MIDAZOLAM HCL 10 MG/2ML IJ SOLN
INTRAMUSCULAR | Status: DC | PRN
  Administered 2012-10-13: 2 mg via INTRAVENOUS
  Administered 2012-10-13: 1 mg via INTRAVENOUS
  Administered 2012-10-13: 2 mg via INTRAVENOUS

## 2012-10-13 MED ORDER — BUTAMBEN-TETRACAINE-BENZOCAINE 2-2-14 % EX AERO
INHALATION_SPRAY | CUTANEOUS | Status: DC | PRN
  Administered 2012-10-13 (×2): 1 via TOPICAL

## 2012-10-13 MED ORDER — FENTANYL CITRATE 0.05 MG/ML IJ SOLN
INTRAMUSCULAR | Status: AC
  Filled 2012-10-13: qty 2

## 2012-10-13 MED ORDER — MIDAZOLAM HCL 5 MG/ML IJ SOLN
INTRAMUSCULAR | Status: AC
  Filled 2012-10-13: qty 2

## 2012-10-13 MED ORDER — FENTANYL CITRATE 0.05 MG/ML IJ SOLN
INTRAMUSCULAR | Status: DC | PRN
  Administered 2012-10-13: 10 ug via INTRAVENOUS
  Administered 2012-10-13: 25 ug via INTRAVENOUS
  Administered 2012-10-13: 15 ug via INTRAVENOUS
  Administered 2012-10-13: 25 ug via INTRAVENOUS

## 2012-10-13 NOTE — Progress Notes (Signed)
Family Medicine Teaching Service Daily Progress Note Service Page: [phone]  Patient Assessment: 67 yo F w/ complicated PMHx including recent admit 09/24/2012 - 10/02/2012 for acute diastolic CHF exacerbation and COPD exacerbation, NSTEMI, anemia, breast mass, leg ulcers, fiborma, CIN lll and electrolyte abnormalities presents with low Hg of 5.4 g/dL via clinic results from home.  Consultants:  Gastroenterology: [facility]  Oncology: Dr. Gaylyn Rong  General Surgery: [facility]  Interventional Radiology  Subjective: unable to see patient this morning as she was off getting EGD & colonoscopy. No acute events overnight.  Objective: Temp:  [97.4 F (36.3 C)-98 F (36.7 C)] 98 F (36.7 C) (02/17 0541) Pulse Rate:  [73-90] 73 (02/17 0541) Resp:  [16-18] 17 (02/17 0541) BP: (103-121)/(54-64) 103/54 mmHg (02/17 0541) SpO2:  [96 %-99 %] 98 % (02/17 0541) Weight:  [220 lb (99.791 kg)] 220 lb (99.791 kg) (02/17 0541) Exam: Unable to see as was off getting EGD & colonoscopy - will check on later today after this procedure  I have reviewed the patient's medications, labs, imaging, and diagnostic testing.  Notable results are summarized below.  Medications:  Scheduled Meds: . atorvastatin  40 mg Oral q1800  . calcium citrate  1 tablet Oral TID WC  . pantoprazole (PROTONIX) IV  80 mg Intravenous Q12H  . sodium chloride  3 mL Intravenous Q12H  . torsemide  40 mg Oral BID   Continuous Infusions: . sodium chloride Stopped (10/11/12 1205)  . sodium chloride     PRN Meds:.albuterol, oxyCODONE-acetaminophen   Labs:  CBC  Recent Labs Lab 10/10/12 1100 10/11/12 0600 10/12/12 0535  WBC 8.1 6.8 6.3  HGB 8.5* 8.9* 10.4*  HCT 27.2* 28.3* 32.1*  PLT 184 180 157    BMET  Recent Labs Lab 10/10/12 1100 10/11/12 0600 10/12/12 0535  NA 140 137 136  K 3.7 4.1 3.1*  CL 103 101 96  CO2 27 26 27   BUN 22 21 23   CREATININE 1.15* 1.32* 1.46*  GLUCOSE 116* 157* 166*  CALCIUM 9.3 8.7 9.2     Imaging/Diagnostic Tests: (getting EGD/colonoscopy today)  Surgical pathology - metastatic mammary carcinoma  Assessment/Plan:  67 y.o. year old female with recent admit 09/24/2012 - 10/02/2012 for acute diastolic CHF exacerbation and COPD exacerbation, NSTEMI, anemia, breast mass, leg ulcers, fiborma, CIN lll and electrolyte abnormalities directly admitted from home due to an incidental Hb of 5.8 @ follow up appointment with PCP.  # Likely Metastatic Process: Multiple potential sources including Breast (mass), Uterus (CIN III), Bone (lesions, likely mets), ?GI source given Melana, Lung (nodule on CT, but less likely).  - bone & breast biopsies show metastatic mammary carcinoma - Less likely GI source but pt has agreed to EGD with colonoscopy - is receiving today - Dr. Gaylyn Rong of oncology ollowing and appreciate his guidance for any further workup. Will finish workup/treatment as OP.  # Anemia, severe Acute on chronic: + Melana and likely peri-splenic bleed. upper vs lower GI bleed? S/p 4 units. GI following.  - ? Upper vs lower GI source for melena. ?stress ulcer from recent ICU stay. On high dose PPI.  - Undergoing colonoscopy & EGD today by [facility]  # Diarrhea: C.Diff negative. Melena likely attributing.  # RESP: COPD, OSA, OHS, Home BiPap but non compliant, O2 Dependent  - Stable, continue O2 after trying to d/c and pt persistently hypoxic - pt is stable currently on 2L  # Hypokalemia: K 3.1 yesterday, no direct K supplementation but did perform bowel prep with  golytely, which has potassium - will plan to recheck BMET after EGD today  # CKD stage III baseline 1-1.5?: - creatinine waxing and waning during this hospitalization (1.56, dropped to 1.15, up to 1.46 on 2/16) - recheck BMET this afternoon  # Diastolic CHF:  - continue to monitor respiratory status closely, did have some prior volume overload with blood products  # DM2:  CBG's well controlled currently at 122-151 in last  24 hours, currently has no sliding scale insulin ordered  # FEN  -SLIV -NPO currently for colonoscopy/EGD, then carb modified diet  # PPx: Protonix 80mg  q12  # Dispo: keep foley until after colonoscopy. Plan for possible d/c later today versus tomorrow. Will need OP f/u with Dr. Gaylyn Rong. Appreciate all specialist involvement.  Levert Feinstein, MD Family Medicine PGY-1 Service Pager [phone]

## 2012-10-13 NOTE — Progress Notes (Signed)
D/Ced pt's foley per MD order. Will monitor for pt's ability to urinate post removal of the foley.

## 2012-10-13 NOTE — Progress Notes (Signed)
Nutrition Brief Note  Patient identified on the Malnutrition Screening Tool (MST) Report for recent weight loss without trying (patient unsure).  Per readings below, patient's weight has been stable.  Wt Readings from Last 10 Encounters:  10/13/12 220 lb (99.791 kg)  10/13/12 220 lb (99.791 kg)  10/06/12 227 lb 8 oz (103.193 kg)  10/02/12 224 lb 13.9 oz (102 kg)  11/07/11 205 lb 11.2 oz (93.305 kg)  06/22/11 206 lb 9.6 oz (93.713 kg)  05/30/11 207 lb 3.2 oz (93.985 kg)  05/01/11 213 lb (96.616 kg)    Body mass index is 42.24 kg/(m^2). Patient meets criteria for Obesity Class III based on current BMI.   Current diet order is Regular, patient is consuming approximately 100% of meals at this time.  Labs and medications reviewed.   No nutrition interventions warranted at this time.  Please consult RD as needed.  Maureen Chatters, RD, LDN Pager #: [phone] After-Hours Pager #: [phone]

## 2012-10-13 NOTE — Progress Notes (Signed)
Patient refuses CPAP at this time. RT encouraged her to call if she changed her mind.

## 2012-10-13 NOTE — Progress Notes (Signed)
I have seen and examined the patient and agree with the assessment and plans.  Buck Mcaffee A. Kennedee Kitzmiller  MD, FACS  

## 2012-10-13 NOTE — Progress Notes (Signed)
FMTS Attending Daily Note:  Renold Don MD  [phone] pager  Family Practice pager:  [phone] I have seen and examined this patient and have reviewed their chart. I have discussed this patient with the resident. I agree with the resident's findings, assessment and care plan.  Patient seen directly after endoscopy.  Still groggy.  Appreciate specialty input.  Still unclear etiology of dropping Hgb, possibility of small bowel bleed.  Biopsy results are back and reveal invasive/metastatic breast CA.  Needs FU with Onc.

## 2012-10-13 NOTE — Consult Note (Signed)
[facility] INPATIENT PROGRESS NOTE  Name: Anita [name]      MRN: [id number]    Location: 2020/2020-01  Date: 10/13/2012 Time:9:08 PM   Subjective: Interval History:Deaisha C Graefe reported feeling generalized fatigue.  She denied bleeding or pain from right breast mass biopsy or from left iliac bone biopsy. The left upper quadrant abdominal pain has also resolved.  She denied any visible source of bleeding.  Per report, her EGD was negative.  Colonoscopy showed erosion in the descending colon of uncertain etiology; biopsy was performed.  She complained of not having much urine output since Foley Cath removal.   Objective: Vital signs in last 24 hours: Temp:  [97.4 F (36.3 C)-98.4 F (36.9 C)] 98.2 F (36.8 C) (02/17 1400) Pulse Rate:  [73-75] 75 (02/17 1400) Resp:  [13-35] 18 (02/17 1400) BP: (103-165)/(52-98) 120/65 mmHg (02/17 1400) SpO2:  [95 %-100 %] 95 % (02/17 1400) Weight:  [220 lb (99.791 kg)] 220 lb (99.791 kg) (02/17 0541)     PHYSICAL EXAM:  Gen: obese woman, in no acute distress. Eyes: No scleral icterus or jaundice. ENT: There was no oropharyngeal lesions. Neck was supple without thyromegaly. Lymphatics: Negative for cervical, supraclavicular, axillary, or inguinal adenopathy.  Respiratory: Lungs were clear bilaterally without wheezing or crackles. Cardiovascular: normal heart rate and rhythm; S1/S2; without murmur, rubs, or gallop. There was no pedal edema. GI: Abdomen was soft, obese, without organomegaly. There was no pain to palpation of left upper quadrant.  Musculoskeletal exam: No spinal tenderness on palpation of vertebral spine or at the site of biopsy of iliac. Skin exam was without ecchymosis, petechiae. Patient was alert and oriented. Attention was good. Language was appropriate. Mood was normal without depression. Speech was not pressured. Thought content was not tangential.  Right breast exam showed some echymosis at the site of right breast core biopsy  without pain to palpation/increased temp/swelling.    LABS: Cr 1.72.  WBC 5.7; Hgb 9.9; Plt 143.    MEDICATIONS: reviewed.      PROBLEM LIST:  1. Metastatic breast cancer with bone met; biopsy proven.   2. Chronic anemia, heme occult positive but no sign of iron deficiency.  EGD/Colonscopy not impressive except for nonspecific descending colon ulcerations; biopsy pending.  Most likely chronic anemia is due to chronic kidney disease, chronic disease (metastatic breast CA).   3. Morbid obesity.  4. Left upper quadrant mass (parasplenic), US showed either hematoma or cyst.  Pain resolved.  5. Deconditioning.  6. Grade 2 dystolic dysfunction.  7. Diabetes mellitus, type II.  8. COPD.  9. History of abnormal PAP smear.  10. Chronic renal insufficiency.  11.  Urinary retention.    IMPRESSION:  I dicussed with patient and with her daughter over the phone of the diagnosis of stage IV breast cancer with bone met.  We are still waiting for ER/PR/Her2neu panel for prognostic and treatment direction.  I discussed with them the need for outpatient brain MRI to [facility] out brain met to complete workup, and bone scan to determined if there is certain location of bone met that would benefit from palliative radiation (impending fracture).  If brain MRI shows no brain met, and her cancer is ER+/HERneg; treatment would be oral antiestrogen therapy.  Regardless of ER status, if HER-2 neu is positive, she would benefit from IV chemo containing antiHER-2 therapy.  There is no survival benefit in lumpectomy/mastectomy in metastatic breast cancer; she declined surgery anyway.     RECOMMENDATIONS:  -  Pending final path. -  D/C home when ready from primary team stand point (esp urinary retention.)  I hope that increasing Cr is from aggressive diuresis with Torsermide and not from acute issues.  CT abdomen last week did not show hydronephrosis.  -  Please arrange for outpatient brain MRI (without contrast due to  increased Cr) and nuclear study bone scan (not bone survey or bone density) to complete staging work up.  - I have arranged for patient to see me in clinic on Tue 10/21/2012 with her daughter present to go over final path and treatment plan.    Thank you for this consultation.  I will sign off for now.    Please send me EPIC message if questions between Feb 20-23.  If urgent issue, please contact on call Oncologist.

## 2012-10-13 NOTE — Progress Notes (Signed)
Brief Family Medicine Teaching Service Note:   S: Patient sitting up in chair watching TV.  O: BP 120/65  Pulse 75  Temp(Src) 98.2 F (36.8 C) (Oral)  Resp 18  Ht 5' 0.5" (1.537 m)  Wt 220 lb (99.791 kg)  BMI 42.24 kg/m2  SpO2 95% General appearance: alert, cooperative and no distress Lungs: clear to auscultation bilaterally Heart: regular rate and rhythm, S1, S2 normal, no murmur, click, rub or gallop Abdomen: soft, non-tender; bowel sounds normal; no masses,  no organomegaly Extremities: extremities normal, atraumatic, no cyanosis or edema  A/P: 67 year old female p/w new anemia of unknown etiology heme + stools, CT scan concerning for metastatic cancer but unknown source:   1) Anemia: pt had EGD and Colonoscopy today Dr. Evette Cristal did not see definitive source of bleeding but did take biopsies.   - Hg remains stable, will continue to monitory - may be due to small hemorrhage/hematoma anterior to spleen  2) Metastatic disease: Breast and bone biopsy are + for invasive metastatic mammary carcinoma.  I did break this news to the patient.  I told her it was metastatic, which she did not seem to understand well, but she did seem to understand better that it has spread to her bones and likely her abdomen, which makes it Stage 4.  I told her that Dr. Gaylyn Rong would be the one to discuss treatment options and her prognosis with her as he is the Cancer specialist.    3) Disposition: If hemoglobin stable patient can go home tomorrow with outpatient Oncology follow up.  She is agreeable to this plan.   Anita [name] 10/13/2012 4:26 PM

## 2012-10-13 NOTE — Op Note (Signed)
[facility] [address] [place], [address]   ENDOSCOPY PROCEDURE REPORT  PATIENT: Anita [name], Anita [name]  MR#: [id number] BIRTHDATE: 1946/06/04 , 66  yrs. old GENDER: Female ENDOSCOPIST: Wandalee Ferdinand, MD REFERRED BY: PROCEDURE DATE:  10/13/2012 PROCEDURE:   EGD with biopsy ASA CLASS: 3 INDICATIONS: anemia, heme positive stool MEDICATIONS: fentanyl 75 mcg IV, Versed 5 mg IV TOPICAL ANESTHETIC:  DESCRIPTION OF PROCEDURE:   After the risks benefits and alternatives of the procedure were thoroughly explained, informed consent was obtained.  The Pentax       endoscope was introduced through the mouth and advanced to the second portion of the duodenum      , limited by Without limitations.   The instrument was slowly withdrawn as the mucosa was fully examined.      FINDINGS:  Esophagus: Normal  Stomach: Mild patchy erythema in the antrum biopsy obtained  Duodenum: Duodenitis in bulb and post bulbar area with some slight nodularity probably related to mild inflammation, biopsied  COMPLICATIONS:none  ENDOSCOPIC IMPRESSION:see above  I see nothing specifically on this exam to explain her degree of anemia   RECOMMENDATIONS:check biopsies      _______________________________ Rosalie DoctorWandalee Ferdinand, MD 10/13/2012 11:02 AM

## 2012-10-13 NOTE — Progress Notes (Signed)
  Subjective: Pt feeling fine other than c/o diarrhea for last few days from bowel prep.  Pt denies pain to breast biopsy site.  Pt excited to get out of the hospital.    Objective: Vital signs in last 24 hours: Temp:  [97.4 F (36.3 C)-98 F (36.7 C)] 98 F (36.7 C) (02/17 0541) Pulse Rate:  [73-90] 73 (02/17 0541) Resp:  [16-18] 17 (02/17 0541) BP: (103-121)/(54-64) 103/54 mmHg (02/17 0541) SpO2:  [96 %-99 %] 98 % (02/17 0541) Weight:  [220 lb (99.791 kg)] 220 lb (99.791 kg) (02/17 0541) Last BM Date: 10/12/12  Intake/Output from previous day: 02/16 0701 - 02/17 0700 In: 240 [P.O.:240] Out: 2625 [Urine:2625] Intake/Output this shift:    PE: Gen:  Alert, NAD, pleasant Right Breast mass:  Area of ecchymosis over biopsy site, minimal TTP, no longer needing dressing   Lab Results:   Recent Labs  10/11/12 0600 10/12/12 0535  WBC 6.8 6.3  HGB 8.9* 10.4*  HCT 28.3* 32.1*  PLT 180 157   BMET  Recent Labs  10/11/12 0600 10/12/12 0535  NA 137 136  K 4.1 3.1*  CL 101 96  CO2 26 27  GLUCOSE 157* 166*  BUN 21 23  CREATININE 1.32* 1.46*  CALCIUM 8.7 9.2   PT/INR No results found for this basename: LABPROT, INR,  in the last 72 hours CMP     Component Value Date/Time   NA 136 10/12/2012 0535   K 3.1* 10/12/2012 0535   CL 96 10/12/2012 0535   CO2 27 10/12/2012 0535   GLUCOSE 166* 10/12/2012 0535   BUN 23 10/12/2012 0535   CREATININE 1.46* 10/12/2012 0535   CREATININE 1.56* 10/06/2012 1145   CALCIUM 9.2 10/12/2012 0535   CALCIUM 6.6* 09/29/2012 1118   PROT 6.7 10/07/2012 1620   ALBUMIN 3.1* 10/07/2012 1620   AST 17 10/07/2012 1620   ALT 21 10/07/2012 1620   ALKPHOS 109 10/07/2012 1620   BILITOT 0.4 10/07/2012 1620   GFRNONAA 37* 10/12/2012 0535   GFRAA 42* 10/12/2012 0535   Lipase  No results found for this basename: lipase       Studies/Results: No results found.  Anti-infectives: Anti-infectives   None       Assessment/Plan 1. Likely breast carcinoma  with metastatic disease, s/p Core biopsy of right breast with 14 gauge tru cut needle x 2. Pending pathology results of bone biopsy and breast biopsy done on Friday.   2. Anemia of chronic disease  3. COPD, O2 dependent  Will sign off on patient unless Oncology wants a Porta-cath placed this admission (Pt mentions she doesn't want a port), but could also get it as an outpatient if discharge is already planned.  F/U with [facility] - Dr. Ezzard Standing as needed.    LOS: 6 days    Aris Georgia 10/13/2012, 7:51 AM Pager: [phone]

## 2012-10-13 NOTE — Op Note (Signed)
[facility] [address] [place], [address]   COLONOSCOPY PROCEDURE REPORT  PATIENT: Anita [name], Anita [name]  MR#: [id number] BIRTHDATE: 01-22-1946 , 66  yrs. old GENDER: Female ENDOSCOPIST: Wandalee Ferdinand, MD REFERRED BY: PROCEDURE DATE:  10/13/2012 PROCEDURE:   colonoscopy with biopsy ASA CLASS:   3 INDICATIONS:anemia, heme positive stool MEDICATIONS: see EGD report for total medications given during combined procedures  DESCRIPTION OF PROCEDURE:   After the risks benefits and alternatives of the procedure were thoroughly explained, informed consent was obtained.  digital rectal exam was performed and was normal.        The Pentax Ped Colon [id number]  endoscope was introduced through the anus and advanced to the cecum.     . No adverse events experienced.   The quality of the prep was good.       The instrument was then slowly withdrawn as the colon was fully examined.    The time to cecum=  .  Withdrawal time=  .  The scope was withdrawn and the procedure completed. COMPLICATIONS: There were no complications.  ENDOSCOPIC IMPRESSION:  The mucosa of the cecum looked normal. There was a small lipoma in the cecum. There were scattered erosions noted primarily in the left colon of uncertain etiology. Random biopsies obtained. No significant lesions seen, no sign of any polyps or malignancy. scattered colonic erosions of uncertain etiology RECOMMENDATIONS: check biopsies.  From a GI standpoint she could probably go home. Call us again if needed.  eSigned:  Wandalee Ferdinand, MD 10/13/2012 11:18 AM   cc:

## 2012-10-14 ENCOUNTER — Encounter (HOSPITAL_COMMUNITY): Payer: Self-pay | Admitting: Gastroenterology

## 2012-10-14 ENCOUNTER — Telehealth: Payer: Self-pay | Admitting: Family Medicine

## 2012-10-14 ENCOUNTER — Other Ambulatory Visit: Payer: Self-pay | Admitting: Sports Medicine

## 2012-10-14 ENCOUNTER — Telehealth: Payer: Self-pay | Admitting: Oncology

## 2012-10-14 DIAGNOSIS — C7951 Secondary malignant neoplasm of bone: Secondary | ICD-10-CM | POA: Insufficient documentation

## 2012-10-14 DIAGNOSIS — I509 Heart failure, unspecified: Secondary | ICD-10-CM

## 2012-10-14 DIAGNOSIS — N189 Chronic kidney disease, unspecified: Secondary | ICD-10-CM

## 2012-10-14 DIAGNOSIS — R195 Other fecal abnormalities: Secondary | ICD-10-CM

## 2012-10-14 DIAGNOSIS — C50919 Malignant neoplasm of unspecified site of unspecified female breast: Secondary | ICD-10-CM | POA: Insufficient documentation

## 2012-10-14 DIAGNOSIS — J449 Chronic obstructive pulmonary disease, unspecified: Secondary | ICD-10-CM

## 2012-10-14 DIAGNOSIS — I5032 Chronic diastolic (congestive) heart failure: Secondary | ICD-10-CM

## 2012-10-14 DIAGNOSIS — D638 Anemia in other chronic diseases classified elsewhere: Secondary | ICD-10-CM

## 2012-10-14 LAB — BASIC METABOLIC PANEL
CO2: 28 mEq/L (ref 19–32)
Calcium: 7.9 mg/dL — ABNORMAL LOW (ref 8.4–10.5)
Creatinine, Ser: 1.89 mg/dL — ABNORMAL HIGH (ref 0.50–1.10)
GFR calc non Af Amer: 27 mL/min — ABNORMAL LOW (ref >90)

## 2012-10-14 LAB — CBC
MCH: 28.2 pg (ref 26.0–34.0)
MCHC: 32.5 g/dL (ref 30.0–36.0)
MCV: 86.7 fL (ref 78.0–100.0)
Platelets: 132 10*3/uL — ABNORMAL LOW (ref 150–400)
RDW: 19.8 % — ABNORMAL HIGH (ref 11.5–15.5)

## 2012-10-14 LAB — GLUCOSE, CAPILLARY: Glucose-Capillary: 137 mg/dL — ABNORMAL HIGH (ref 70–99)

## 2012-10-14 MED ORDER — PANTOPRAZOLE SODIUM 40 MG PO TBEC: 40.0000 mg | DELAYED_RELEASE_TABLET | Freq: Every day | ORAL | Status: DC

## 2012-10-14 MED ORDER — PANTOPRAZOLE SODIUM 40 MG PO TBEC
80.0000 mg | DELAYED_RELEASE_TABLET | Freq: Every day | ORAL | Status: DC
  Administered 2012-10-14: 80 mg via ORAL
  Filled 2012-10-14: qty 2

## 2012-10-14 MED ORDER — TORSEMIDE 20 MG PO TABS: 20.0000 mg | ORAL_TABLET | Freq: Every day | ORAL | Status: DC

## 2012-10-14 MED ORDER — POTASSIUM CHLORIDE 10 MEQ/100ML IV SOLN
10.0000 meq | INTRAVENOUS | Status: DC
  Administered 2012-10-14: 10 meq via INTRAVENOUS
  Filled 2012-10-14 (×4): qty 100

## 2012-10-14 MED ORDER — POTASSIUM CHLORIDE CRYS ER 20 MEQ PO TBCR
40.0000 meq | EXTENDED_RELEASE_TABLET | Freq: Once | ORAL | Status: AC
  Administered 2012-10-14: 40 meq via ORAL
  Filled 2012-10-14: qty 2

## 2012-10-14 NOTE — Progress Notes (Signed)
Pt having frequent loose/liquid stools. Obtained specimen yesterday 10/13/12 and was negative for C.diff. Will continue to monitor.

## 2012-10-14 NOTE — Telephone Encounter (Signed)
[facility] After-hours line  Patient discharged today. Had diarrhea in hospital. C diff negative. No d/c summary available yet regarding plans for diarrhea other than thoughts that melena was contributing from note today.   Has had 2-3 bowel movements that have been watery but not large volume. Not noting any blood.   Patient noted to have CHF diastolic. Encouraged patient to orally rehydrate overnight and come in for close follow up with SDA tomorrow as may need labs given hypokalemia from today.   Asked patient how could I help her this evning and she complained of slight cramping abdominal pain for which she was prescribed percocet in hospital but given none. Informed patient that we would have to write a script in the office for this and that she should follow up closely.

## 2012-10-14 NOTE — Progress Notes (Signed)
Discharge instructions given to pt and explained new medications. Prescriptions were sent to her pharmacy. Pt verbalizes her understanding of diagnosis, medications, and follow up appointments. Pt is stable for discharge with her husband. Will provide wheelchair to escort her to the car.

## 2012-10-14 NOTE — Progress Notes (Signed)
Patient dressed and belongings packed and husband here w/ O2 tank pt. D.C. W/O2 at 2l/m/[place] N.T. Aware at 19:30 , N.T. arrived. 19:55. patient left via w.c. Alert and orin. W/ no complaints. And w/ belongings

## 2012-10-14 NOTE — Progress Notes (Signed)
FMTS Attending Daily Note:  Renold Don MD  [phone] pager  Family Practice pager:  [phone] I have seen and examined this patient and have reviewed their chart. I have discussed this patient with the resident. I agree with the resident's findings, assessment and care plan.  Additionally: - Frequent urinary output, likely related to increased dose of Demedex.  Decreasing to once daily dosing with FU outpatient in 1-2 days for repeat creatinine.  Rate of rise of creatinine has decreased.   - No evidence of bleed on endoscopy.  Of note, patient was on high dose PPI for several days prior to procedures.  Repeat CBC outpt as well. - OK for DC home today.  Will arrange for outpt imaging and already with outpt FU Onc next week.

## 2012-10-14 NOTE — Telephone Encounter (Signed)
Pt schedule w/Dr. Gaylyn Rong on 02/26 @ 1:30.  Welcome packet mailed.

## 2012-10-14 NOTE — Progress Notes (Signed)
Family Medicine Teaching Service Daily Progress Note Service Page: [phone]  Patient Assessment: 67 yo F w/ complicated PMHx including recent admit 09/24/2012 - 10/02/2012 for acute diastolic CHF exacerbation and COPD exacerbation, NSTEMI, anemia, breast mass, leg ulcers, fiborma, CIN lll and electrolyte abnormalities presents with low Hg of 5.4 g/dL via clinic results from home.  Consultants:  Gastroenterology: [facility]  Oncology: Dr. Gaylyn Rong  General Surgery: [facility]  Interventional Radiology  Subjective: Doing well, no complaints this morning, wants to go home today  Objective: Temp:  [97.4 F (36.3 C)-98.4 F (36.9 C)] 98.2 F (36.8 C) (02/18 0500) Pulse Rate:  [75-80] 76 (02/18 0500) Resp:  [13-35] 18 (02/18 0500) BP: (108-165)/(52-98) 113/66 mmHg (02/18 0500) SpO2:  [95 %-100 %] 97 % (02/18 0500) Weight:  [222 lb 0.1 oz (100.7 kg)] 222 lb 0.1 oz (100.7 kg) (02/18 0500) Exam: Gen: NAD CV: rrr, no mrg Pulm: CTAB, no wheezes Abd: s, NT, ND Ext: no edema  I have reviewed the patient's medications, labs, imaging, and diagnostic testing.  Notable results are summarized below.  Medications:  Scheduled Meds: . atorvastatin  40 mg Oral q1800  . calcium citrate  1 tablet Oral TID WC  . pantoprazole (PROTONIX) IV  80 mg Intravenous Q12H  . potassium chloride  10 mEq Intravenous Q1 Hr x 4  . sodium chloride  3 mL Intravenous Q12H  . torsemide  40 mg Oral BID   Continuous Infusions: . sodium chloride Stopped (10/11/12 1205)   PRN Meds:.albuterol, oxyCODONE-acetaminophen   Labs:  CBC  Recent Labs Lab 10/12/12 0535 10/13/12 1558 10/14/12 0600  WBC 6.3 5.7 4.9  HGB 10.4* 9.9* 8.9*  HCT 32.1* 30.7* 27.4*  PLT 157 143* 132*    BMET  Recent Labs Lab 10/12/12 0535 10/13/12 1558 10/14/12 0600  NA 136 139 139  K 3.1* 3.4* 2.9*  CL 96 97 97  CO2 27 29 28   BUN 23 21 28*  CREATININE 1.46* 1.72* 1.89*  GLUCOSE 166* 165* 149*  CALCIUM 9.2 8.5 7.9*    Imaging/Diagnostic  Tests: EGD/colo no source of bleeding  Surgical pathology - metastatic mammary carcinoma  Assessment/Plan:  67 y.o. year old female with recent admit 09/24/2012 - 10/02/2012 for acute diastolic CHF exacerbation and COPD exacerbation, NSTEMI, anemia, breast mass, leg ulcers, fiborma, CIN lll and electrolyte abnormalities directly admitted from home due to an incidental Hb of 5.8 @ follow up appointment with PCP.  # Likely Metastatic Process: Multiple potential sources including Breast (mass), Uterus (CIN III), Bone (lesions, likely mets), ?GI source given Melana, Lung (nodule on CT, but less likely).  - bone & breast biopsies show metastatic mammary carcinoma - EGD and colo showed no sources of bleeding - Dr. Gaylyn Rong of oncology following and appreciate his guidance for any further workup. Will finish workup/treatment as OP. Outpatient MRI and nuclear bone scan.  # Anemia, severe Acute on chronic: + Melana and likely peri-splenic bleed. upper vs lower GI bleed? S/p 4 units. GI following. Hgb dropped from 9.9 to 8.9. - On high dose PPI.  - EGD and colo with no source of bleed-? Upper GI bleed now that has been on PPI several days prior to EGD  # Diarrhea: C.Diff negative. Melena likely attributing.  # RESP: COPD, OSA, OHS, Home BiPap but non compliant, O2 Dependent  - Stable, continue O2 - pt is stable currently on 2L  # Hypokalemia: K 2.9 - supplement with K-dur 40 mEq x2 doses -will continue to follow  #  CKD stage III baseline 1-1.5?: - Cr continues to rise-patient has stone in left kidney, ? Could this be contributing to worsening renal function - will decrease torsemide dosing to 20 mg daily  # Diastolic CHF: stable - continue to monitor respiratory status closely, did have some prior volume overload with blood products  # DM2:  CBG's well controlled currently at 107-155 in last 24 hours, currently has no sliding scale insulin ordered  # FEN  -SLIV -regular diet  # PPx: Protonix  80mg  q12  # Dispo: pending work up of deteriorating renal function  Marikay Alar, MD [facility] Medicine PGY-1 Service Pager [phone]

## 2012-10-15 NOTE — Addendum Note (Signed)
Addended by: Damita Lack on: 10/15/2012 08:55 AM   Modules accepted: Orders

## 2012-10-17 ENCOUNTER — Telehealth: Payer: Self-pay | Admitting: *Deleted

## 2012-10-17 ENCOUNTER — Ambulatory Visit (INDEPENDENT_AMBULATORY_CARE_PROVIDER_SITE_OTHER): Payer: Medicaid Other | Admitting: Nurse Practitioner

## 2012-10-17 ENCOUNTER — Encounter: Payer: Self-pay | Admitting: Nurse Practitioner

## 2012-10-17 ENCOUNTER — Encounter: Payer: Self-pay | Admitting: Family Medicine

## 2012-10-17 ENCOUNTER — Ambulatory Visit (INDEPENDENT_AMBULATORY_CARE_PROVIDER_SITE_OTHER): Payer: Medicare Other | Admitting: Family Medicine

## 2012-10-17 VITALS — BP 132/59 | HR 85 | Temp 98.4°F | Ht 65.0 in | Wt 224.0 lb

## 2012-10-17 VITALS — BP 104/70 | HR 85 | Ht 62.0 in | Wt 224.2 lb

## 2012-10-17 DIAGNOSIS — I509 Heart failure, unspecified: Secondary | ICD-10-CM

## 2012-10-17 LAB — CBC
MCH: 27 pg (ref 26.0–34.0)
MCHC: 32.8 g/dL (ref 30.0–36.0)
Platelets: 179 10*3/uL (ref 150–400)
RBC: 3.33 MIL/uL — ABNORMAL LOW (ref 3.87–5.11)

## 2012-10-17 NOTE — Discharge Summary (Signed)
Family Medicine Teaching Service  Discharge Note : Attending Jeff Maximillian Habibi MD Pager [phone] Inpatient Team Pager:  [phone]  I have reviewed this patient and the patient's chart and have discussed discharge planning with the resident at the time of discharge. I agree with the discharge plan as above.    

## 2012-10-17 NOTE — Patient Instructions (Signed)
It was good to see you.  Please stop taking the pantoprazole if it is causing your problems.  You can take immodium over the counter for your diarrhea.

## 2012-10-17 NOTE — Assessment & Plan Note (Signed)
Pt is asymptomatic, will re-check CBC today.

## 2012-10-17 NOTE — Assessment & Plan Note (Signed)
Discussed with patient that there are very effective treatments for breast cancer, and she should listen to Dr. Gaylyn Rong and be open to his treatment suggestions.  I also strongly advised she bring her daughter with her to that appointment to help her make decisions.

## 2012-10-17 NOTE — Patient Instructions (Addendum)
We are going to arrange for a stress test (Lexiscan)  See Dr. Antoine Poche in 6 weeks  Stay on your current medicines  Call the [facility] office at [phone] if you have any questions, problems or concerns.

## 2012-10-17 NOTE — Progress Notes (Signed)
Anita [name] Date of Birth: [birth year] Medical Record #[id number]  History of Present Illness: [name] is seen back today for a post hospital visit. She is seen for Dr. Antoine Poche. She has multiple medical issues. She was recently admitted 1/29 to 2/6 with acute diastolic heart failure and a COPD exacerbation. Had a NSTEMI - managed conservatively. This was in the setting of severe anemia. Her other issues include anemia, breast mass, leg ulcers and apparently found to have metastatic mammary carcinoma. EGD and colon showed no source of bleeding. Dr. Gaylyn Rong is following. Ef is ok.   She comes in today. She is here with her husband, Anita [name]. Seems to be doing ok. Says her breathing is better. Has stopped smoking!! No chest pain. She understands that she has metastatic breast cancer. [facility] and to see pulmonary next week. Also with lung nodules apparently. Tolerating her medicines. Not dizzy or lightheaded.   Current Outpatient Prescriptions on File Prior to Visit  Medication Sig Dispense Refill  . albuterol (PROVENTIL HFA;VENTOLIN HFA) 108 (90 BASE) MCG/ACT inhaler Inhale 1 puff into the lungs 2 (two) times daily as needed for wheezing or shortness of breath.      [name] aspirin EC 81 MG EC tablet Take 1 tablet (81 mg total) by mouth daily.  30 tablet  3  . atorvastatin (LIPITOR) 40 MG tablet Take 1 tablet (40 mg total) by mouth daily at 6 PM.  30 tablet  3  . Calcium Citrate (CALCITRATE PO) Take 1 tablet by mouth daily.      . pantoprazole (PROTONIX) 40 MG tablet Take 1 tablet (40 mg total) by mouth daily.  60 tablet  1  . torsemide (DEMADEX) 20 MG tablet Take 1 tablet (20 mg total) by mouth daily.  30 tablet  1   No current facility-administered medications on file prior to visit.    Allergies  Allergen Reactions  . Omnipaque (Iohexol)     Pt claims she developed hives after given contrast  . Benzene Rash    Past Medical History  Diagnosis Date  .  Fibroid   . Morbid obesity   . 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 COPD (chronic obstructive pulmonary disease)   . Diastolic heart failure   . NSTEMI (non-ST elevated myocardial infarction)   . Anemia   . Breast mass   . Ulcers of both lower legs   . Tobacco abuse     Past Surgical History  Procedure Laterality Date  . Colonoscopy, esophagogastroduodenoscopy (egd) and esophageal dilation N/A 10/13/2012    Procedure: colonoscopy and egd;  Surgeon: Anita Shiver, MD;  Location: [facility] ENDOSCOPY;  Service: Endoscopy;  Laterality: N/A;    History  Smoking status  . Former Smoker -- 2.00 packs/day for 25 years  . Types: Cigarettes  . Quit date: 09/26/2012  Smokeless tobacco  . Never Used    History  Alcohol Use No    Family History  Problem Relation Age of Onset  . Cancer Mother     leukemia  . Hypertension Mother   . Diabetes Father   . Heart disease Father     passed away due to heart attack    Review of Systems: The review of systems is per the HPI.  All other systems were reviewed and are negative.  Physical Exam: BP 104/70  Pulse 85  Ht 5\' 2"  (1.575  m)  Wt 224 lb 3.2 oz (101.696 kg)  BMI 41 kg/m2  SpO2 96% Patient is pleasant and in no acute distress. She looks very chronically ill. Color is quite sallow. She is obese. Skin is warm and dry.  HEENT is unremarkable. Normocephalic/atraumatic. PERRL. Sclera are nonicteric. Neck is supple. No masses. No JVD. Lungs are coarse. Cardiac exam shows a regular rate and rhythm. Heart tones are distant. Abdomen is obese but soft. Extremities are quite full with 2+ edema. Gait and ROM are intact. No gross neurologic deficits noted.  LABORATORY DATA:  Lab Results  Component Value Date   WBC 4.9 10/14/2012   HGB 8.9* 10/14/2012   HCT 27.4* 10/14/2012   PLT 132* 10/14/2012   GLUCOSE 149* 10/14/2012   ALT 21 10/07/2012   AST 17 10/07/2012   NA 139 10/14/2012   K 2.9* 10/14/2012   CL 97  10/14/2012   CREATININE 1.89* 10/14/2012   BUN 28* 10/14/2012   CO2 28 10/14/2012   TSH 0.241* 09/30/2012   INR 0.97 10/10/2012   HGBA1C 6.6* 09/25/2012   Echo Study Conclusions  - Left ventricle: The cavity size was normal. Wall thickness was normal. The estimated ejection fraction was 55%. Although no diagnostic regional wall motion abnormality was identified, this possibility cannot be completely excluded on the basis of this study. Features are consistent with a pseudonormal left ventricular filling pattern, with concomitant abnormal relaxation and increased filling pressure (grade 2 diastolic dysfunction). - Ventricular septum: D-shaped interventricular septum suggests RV pressure and volume overload. - Aortic valve: There was no stenosis. - Mitral valve: Mildly calcified annulus. - Right ventricle: Poorly visualized. The cavity size was mildly dilated. Systolic function was normal. - Right atrium: The atrium was mildly dilated. - Pulmonary arteries: No complete TR doppler jet so unable to estimate PA systolic pressure. - Systemic veins: IVC was not visualized.  Assessment / Plan: 1. Diastolic heart failure - she says her swelling has improved greatly. BP is soft. I was not inclined to change any of her medicines today. She has CKD which limits our ability to add ACE. I do not think she would tolerate BB due to her lung disease. I think her most pressing issue at this time is her cancer.  2. NSTEMI - probably from demand ischemia - EKG shows sinus today. We will arrange for Lexiscan per Dr. Jenene Slicker recommendation. I would favor conservative management.   3. Metastatic breast cancer  We will arrange for Lexiscan. See Dr. Antoine Poche back in 6 weeks.   Patient is agreeable to this plan and will call if any problems develop in the interim.

## 2012-10-17 NOTE — Assessment & Plan Note (Signed)
Recheck metabolic panel.   

## 2012-10-17 NOTE — Assessment & Plan Note (Signed)
Says she is taking calcium supplements, will check CMET.

## 2012-10-17 NOTE — Telephone Encounter (Signed)
Called received from [facility] Case Manager  Denna Haggard. States patient was discharged from hospital  on 02/18.  Physical Therapy was ordered but Occupational Therapy  was not and patient would also like to have occupational therapy to come in.  Please call back with verbal order to Diane  [phone].

## 2012-10-17 NOTE — Progress Notes (Signed)
  Subjective:    Patient ID: Anita [name], female    DOB: [birth year], 67 y.o.   MRN: [id number]  HPI:  [name] comes in for hospital follow up.  She tells me she feels great.  She feels fine on the 2L [place].    GI bleed and acute blood loss anemia- denies further blood in her stool, although she is having diarrhea.  She says that the PPI we gave her from the hospital gives her diarrhea so she stopped it.  No fatigue, light headedness. She says that the percocet we gave her in the hospital helped her diarrhea and she is wondering if she can have more.  She denies pain.   Metastatic Breast Cancer: Patient says she understands she has stage 4 cancer, then tells me she does not want to go through chemotherapy or surgery.  She says the breast mass and metastases are not bothering her.    Past Medical History  Diagnosis Date  . Fibroid   . Morbid obesity   . 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 COPD (chronic obstructive pulmonary disease)   . Diastolic heart failure   . NSTEMI (non-ST elevated myocardial infarction)   . Anemia   . Breast mass   . Ulcers of both lower legs   . Tobacco abuse     History  Substance Use Topics  . Smoking status: Former Smoker -- 2.00 packs/day for 25 years    Types: Cigarettes    Quit date: 09/26/2012  . Smokeless tobacco: Never Used  . Alcohol Use: No    Family History  Problem Relation Age of Onset  . Cancer Mother     leukemia  . Hypertension Mother   . Diabetes Father   . Heart disease Father     passed away due to heart attack     ROS Pertinent items in HPI    Objective:  Physical Exam:  BP 132/59  Pulse 85  Temp(Src) 98.4 F (36.9 C) (Oral)  Ht 5\' 5"  (1.651 m)  Wt 224 lb (101.606 kg)  BMI 37.28 kg/m2  SpO2 98% General appearance: alert, cooperative and no distress, chronically ill appearing Head: Normocephalic, without obvious abnormality, atraumatic, nasal cannula in place Lungs: clear to  auscultation bilaterally, decreased breath sounds in bases Heart: regular rate and rhythm, S1, S2 normal, no murmur, click, rub or gallop Pulses: 2+ and symmetric LE: 1+ pitting edema (much improved from previous).       Assessment & Plan:

## 2012-10-18 LAB — COMPREHENSIVE METABOLIC PANEL
Albumin: 3.8 g/dL (ref 3.5–5.2)
Alkaline Phosphatase: 139 U/L — ABNORMAL HIGH (ref 39–117)
Glucose, Bld: 117 mg/dL — ABNORMAL HIGH (ref 70–99)
Potassium: 3.6 mEq/L (ref 3.5–5.3)
Sodium: 139 mEq/L (ref 135–145)
Total Protein: 6.2 g/dL (ref 6.0–8.3)

## 2012-10-20 ENCOUNTER — Telehealth: Payer: Self-pay | Admitting: Family Medicine

## 2012-10-20 NOTE — Telephone Encounter (Signed)
Patient has completed course and does not need additional levaquin.

## 2012-10-20 NOTE — Telephone Encounter (Signed)
Pt is stating that she was given Levoquin in the hospital and is now out - is not sure if she needs to continue taking it.  pls advise - she has more questions about some of her other medicines also

## 2012-10-21 ENCOUNTER — Ambulatory Visit (INDEPENDENT_AMBULATORY_CARE_PROVIDER_SITE_OTHER): Payer: Medicare Other | Admitting: Pulmonary Disease

## 2012-10-21 ENCOUNTER — Ambulatory Visit (HOSPITAL_COMMUNITY)
Admission: RE | Admit: 2012-10-21 | Discharge: 2012-10-21 | Disposition: A | Discharge status: Home / Self Care | Payer: Medicare Other | Source: Ambulatory Visit | Attending: Family Medicine | Admitting: Family Medicine

## 2012-10-21 ENCOUNTER — Other Ambulatory Visit: Payer: Self-pay | Admitting: Sports Medicine

## 2012-10-21 ENCOUNTER — Encounter (HOSPITAL_COMMUNITY)
Admission: RE | Admit: 2012-10-21 | Discharge: 2012-10-21 | Disposition: A | Discharge status: Home / Self Care | Payer: Medicaid Other | Source: Ambulatory Visit | Attending: Family Medicine | Admitting: Family Medicine

## 2012-10-21 ENCOUNTER — Telehealth: Payer: Self-pay | Admitting: Family Medicine

## 2012-10-21 ENCOUNTER — Encounter: Payer: Self-pay | Admitting: Pulmonary Disease

## 2012-10-21 VITALS — BP 126/74 | HR 75 | Temp 97.5°F | Ht 62.0 in | Wt 227.2 lb

## 2012-10-21 DIAGNOSIS — C50919 Malignant neoplasm of unspecified site of unspecified female breast: Secondary | ICD-10-CM | POA: Insufficient documentation

## 2012-10-21 DIAGNOSIS — C7951 Secondary malignant neoplasm of bone: Secondary | ICD-10-CM

## 2012-10-21 MED ORDER — TECHNETIUM TC 99M MEDRONATE IV KIT
25.0000 | PACK | Freq: Once | INTRAVENOUS | Status: AC | PRN
  Administered 2012-10-21: 25 via INTRAVENOUS

## 2012-10-21 NOTE — Assessment & Plan Note (Signed)
There is concern for COPD.  Her breathing is doing better since she stopped smoking.  She can continue albuterol as needed for now.  Will defer further testing until she decide whether she would want to undergo therapy for her breast cancer.

## 2012-10-21 NOTE — Assessment & Plan Note (Signed)
She is to continue supplemental oxygen at night for now.  Will defer further sleep tests pending outcome of oncology evaluation.

## 2012-10-21 NOTE — Progress Notes (Signed)
Chief Complaint  Patient presents with  . HFU    breathing is better, c/o slight dry cough. denies any wheezing, chest tx. She does get SOB w/ exertion. Wears oxygen 75% of the time 2 liters    CC: Anita [name]  History of Present Illness: Anita [name] is a 67 y.o. female former smoker with COPD and possible sleep apnea.  She also has history of stage IV breast cancer.  She was in hospital recently for respiratory failure.  She has history of smoking and there was concern about her having COPD.  There was also concern for sleep disordered breathing.  She has been using 2 liters oxygen since she has been home.  She is not sure if this is helping.  She does not have any trouble sleeping.  She has not smoked since January.  She uses albuterol once or twice per day, and this helps some.  She gets occasional cough.  She denies wheeze or sputum.  She is scheduled to f/u Dr. Gaylyn Rong, and then determine if she wants to undergo therapy for her breast cancer.  She maintained her oxygen level with room air and exertion today.  She had increased heart rate with exertion.  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  Anita [name]  has past surgical history that includes Colonoscopy, esophagogastroduodenoscopy (egd) and esophageal dilation (N/A, 10/13/2012).  Prior to Admission medications   Medication Sig Start Date End Date Taking? Authorizing Provider  albuterol (PROVENTIL HFA;VENTOLIN HFA) 108 (90 BASE) MCG/ACT inhaler Inhale 1 puff into the lungs 2 (two) times daily as needed for  wheezing or shortness of breath.    Historical Provider, MD  aspirin EC 81 MG EC tablet Take 1 tablet (81 mg total) by mouth daily. 10/02/12   Glori Luis, MD  atorvastatin (LIPITOR) 40 MG tablet Take 1 tablet (40 mg total) by mouth daily at 6 PM. 10/02/12   Glori Luis, MD  Calcium Citrate (CALCITRATE PO) Take 1 tablet by mouth daily.    Historical Provider, MD  pantoprazole (PROTONIX) 40 MG tablet Take 1 tablet (40 mg total) by mouth daily. 10/14/12   Glori Luis, MD  torsemide (DEMADEX) 20 MG tablet Take 1 tablet (20 mg total) by mouth daily. 10/15/12   Glori Luis, MD    Allergies  Allergen Reactions  . Omnipaque (Iohexol)     Pt claims she developed hives after given contrast  . Benzene Rash     Physical Exam:  General - No distress ENT - No sinus tenderness, no oral exudate, no LAN Cardiac - s1s2 regular, no murmur Chest - No wheeze/rales/dullness Back - No focal tenderness Abd - Soft, non-tender Ext - 1+ edema Neuro - Normal strength Skin - No rashes Psych - normal mood, and behavior   Assessment/Plan:  Coralyn Helling, MD [place] [facility] Pager:  [phone] 10/21/2012, 1:44 PM

## 2012-10-21 NOTE — Assessment & Plan Note (Addendum)
Congratulated her on smoking cessation.  

## 2012-10-21 NOTE — Assessment & Plan Note (Signed)
Her oxygen level was adequate on room air at rest and with exertion today.  She can forego use of oxygen during the day.  Will arrange for overnight oximetry to determine if she needs to continue using oxygen at night.

## 2012-10-21 NOTE — Patient Instructions (Signed)
You do not need to use oxygen during the day Will arrange for overnight oxygen test Continue using 2 liters oxygen at night until your oxygen tests results are available Follow up in 3 months

## 2012-10-21 NOTE — Telephone Encounter (Signed)
Spoke with patient,she was needing an extension on school note for other doctor appt other than Dr Adriana Simas.I explained that the Dr she's seeing would need to give her a letter or if still not better after 11/03/2012 to call office back and we will talk to Dr Adriana Simas if letter would need to be extended or she would need to be seen. Pt voiced understanding. Kruze Atchley, Virgel Bouquet

## 2012-10-21 NOTE — Telephone Encounter (Signed)
Patient is requesting a extension on her school note that is set to expire on 11/03/12. Would like this mailed to her home address when completed . Pls call for questions.

## 2012-10-23 ENCOUNTER — Telehealth: Payer: Self-pay | Admitting: Oncology

## 2012-10-23 ENCOUNTER — Other Ambulatory Visit: Payer: Medicare Other | Admitting: Lab

## 2012-10-23 ENCOUNTER — Ambulatory Visit (HOSPITAL_BASED_OUTPATIENT_CLINIC_OR_DEPARTMENT_OTHER): Payer: Medicare Other | Admitting: Oncology

## 2012-10-23 ENCOUNTER — Telehealth: Payer: Self-pay | Admitting: *Deleted

## 2012-10-23 ENCOUNTER — Ambulatory Visit: Payer: Medicare Other

## 2012-10-23 VITALS — BP 123/77 | HR 85 | Temp 97.1°F | Resp 20 | Ht 62.0 in | Wt 225.4 lb

## 2012-10-23 DIAGNOSIS — C50419 Malignant neoplasm of upper-outer quadrant of unspecified female breast: Secondary | ICD-10-CM

## 2012-10-23 DIAGNOSIS — N189 Chronic kidney disease, unspecified: Secondary | ICD-10-CM

## 2012-10-23 DIAGNOSIS — N039 Chronic nephritic syndrome with unspecified morphologic changes: Secondary | ICD-10-CM

## 2012-10-23 DIAGNOSIS — C7952 Secondary malignant neoplasm of bone marrow: Secondary | ICD-10-CM

## 2012-10-23 MED ORDER — LETROZOLE 2.5 MG PO TABS: 2.5000 mg | ORAL_TABLET | Freq: Every day | ORAL | Status: DC

## 2012-10-23 NOTE — Telephone Encounter (Signed)
Gave pt appt for lab and ML on March 2014 emailed Marcelino Duster regarding chemo

## 2012-10-23 NOTE — Telephone Encounter (Signed)
Per staff message and POF I have scheduled appts.  JMW  

## 2012-10-23 NOTE — Patient Instructions (Addendum)
1.  Diagnosis:  Metastatic breast cancer (ER pos/PR pos/ HER2 neg) with spread to bone.  2.  Work up:  Consider brain imaging soon to rule out brain met.  3.  Treatment recommendation:  (if no brain met):  There is no role for breast cancer resection once it has spread.  If bone met becomes painful in the future, we can consider spot radiation.   *  Oral antiestrogen therapy:  Aromatase inhibitor (such as Ferama or Arimidex) once daily.    *  Follow up:  Breast exam to see whether cancer is shrinking.  Repeat CT scan after about 4-6 months to ensure stability of disease.  *  Consider Zometa IV therapy once a month to decrease risk of bone fracture/pain.  Potential side effects:  Osteonecrosis of the jaw.  Need dental clearance now before the first dose.  4.  Follow up:  In about 1 month.

## 2012-10-23 NOTE — Telephone Encounter (Signed)
Gave pt appt appt for lab, ML and chemo March 2014

## 2012-10-23 NOTE — Progress Notes (Signed)
[facility]  Telephone:([phone]) [phone] Fax:([phone]   OFFICE PROGRESS NOTE   Cc:  Everlene Other, DO  DIAGNOSIS: metastatic mammary carcinoma with met to bones.  ER 100%/ PR 0%/Her2 neu negative.   PAST THERAPY:  Biopsy only   CURRENT THERAPY: due to start hormonal therapy soon.   INTERVAL HISTORY: Anita [name] 67 y.o. female returns for hospital follow up with her husband and daughter.  She reported that she has DOE walking about 20 feet. She has mild fatigue; however, she is able to cook and perform light cleaning.  She still has a large right breast mass.  She denied any pain or bleeding from the breast mass. She has bilateral knee pain requiring her to use wheelchair for long distance ambulation.  However, she is able to walk on her own covering short distance at home.  Her left upper quadrant abdominal pain has completely resolved.   Patient denies fever, anorexia, weight loss, headache, visual changes, confusion, drenching night sweats, palpable lymph node swelling, mucositis, odynophagia, dysphagia, nausea vomiting, jaundice, chest pain, palpitation, productive cough, gum bleeding, epistaxis, hematemesis, hemoptysis, abdominal pain, abdominal swelling, early satiety, melena, hematochezia, hematuria, skin rash, spontaneous bleeding, heat or cold intolerance, bowel bladder incontinence, back pain, focal motor weakness, paresthesia, depression.    Past Medical History  Diagnosis Date  . Fibroid   . Morbid obesity   . 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 COPD (chronic obstructive pulmonary disease)   . Diastolic heart failure   . NSTEMI (non-ST elevated myocardial infarction)   . Anemia   . Breast cancer   . Ulcers of both lower legs   . Tobacco abuse     Past Surgical History  Procedure Laterality Date  . Colonoscopy, esophagogastroduodenoscopy (egd) and esophageal dilation N/A 10/13/2012    Procedure: colonoscopy  and egd;  Surgeon: Graylin Shiver, MD;  Location: [facility] ENDOSCOPY;  Service: Endoscopy;  Laterality: N/A;    Current Outpatient Prescriptions  Medication Sig Dispense Refill  . albuterol (PROVENTIL HFA;VENTOLIN HFA) 108 (90 BASE) MCG/ACT inhaler Inhale 1 puff into the lungs 2 (two) times daily as needed for wheezing or shortness of breath.      [name] aspirin EC 81 MG EC tablet Take 1 tablet (81 mg total) by mouth daily.  30 tablet  3  . atorvastatin (LIPITOR) 40 MG tablet Take 1 tablet (40 mg total) by mouth daily at 6 PM.  30 tablet  3  . Calcium Citrate (CALCITRATE PO) Take 1 tablet by mouth 2 (two) times daily.       [name] torsemide (DEMADEX) 20 MG tablet Take 1 tablet (20 mg total) by mouth daily.  30 tablet  1  . letrozole (FEMARA) 2.5 MG tablet Take 1 tablet (2.5 mg total) by mouth daily.  30 tablet  5   No current facility-administered medications for this visit.    ALLERGIES:  is allergic to omnipaque and benzene.  REVIEW OF SYSTEMS:  The rest of the 14-point review of system was negative.   Filed Vitals:   10/23/12 1417  BP: 123/77  Pulse: 85  Temp: 97.1 F (36.2 C)  Resp: 20   Wt Readings from Last 3 Encounters:  10/23/12 225 lb 6.4 oz (102.241 kg)  10/21/12 227 lb 3.2 oz (103.057 kg)  10/17/12 224 lb (101.606 kg)   ECOG Performance status: 1 due to obesity, decondition, bilateral knee pain.   PHYSICAL  EXAMINATION:   General:  Obese woman, in no acute distress.  Eyes:  no scleral icterus.  ENT:  There were no oropharyngeal lesions.  Neck was without thyromegaly.  Lymphatics:  Negative cervical, supraclavicular or axillary adenopathy.  Respiratory: lungs were clear bilaterally without wheezing or crackles.  Cardiovascular:  Regular rate and rhythm, S1/S2, without murmur, rub or gallop.  There was no pedal edema.  GI:  abdomen was soft, flat, nontender, obese, without organomegaly. There was no pain to palpation of LUQ area.  Muscoloskeletal:  no spinal tenderness of palpation of  vertebral spine.  Skin exam was without echymosis, petichae.  Neuro exam was nonfocal. Patient was alert and oriented.  Attention was good.   Language was appropriate.  Mood was normal without depression.  Speech was not pressured.  Thought content was not tangential.  Right breast exam showed a right lower outer quadrant breast mass about 5cm.     LABORATORY/RADIOLOGY DATA:  Lab Results  Component Value Date   WBC 5.0 10/17/2012   HGB 9.0* 10/17/2012   HCT 27.4* 10/17/2012   PLT 179 10/17/2012   GLUCOSE 117* 10/17/2012   ALKPHOS 139* 10/17/2012   ALT 18 10/17/2012   AST 18 10/17/2012   NA 139 10/17/2012   K 3.6 10/17/2012   CL 100 10/17/2012   CREATININE 1.51* 10/17/2012   BUN 32* 10/17/2012   CO2 26 10/17/2012   INR 0.97 10/10/2012   HGBA1C 6.6* 09/25/2012   IMAGING:  I personally reviewed the following bone scan and showed the image to the patient and her family.   Nm Bone Scan Whole Body  10/21/2012  *RADIOLOGY REPORT*  Clinical Data: Breast cancer with bone metastasis appear  NUCLEAR MEDICINE WHOLE BODY BONE SCINTIGRAPHY  Technique:  Whole body anterior and posterior images were obtained approximately 3 hours after intravenous injection of radiopharmaceutical.  Radiopharmaceutical: CURIE TC-MDP TECHNETIUM TC 1M MEDRONATE IV KIT no  Comparison: CT scan 10/08/2012 at the  Findings: There is diffuse abnormal uptake within the spine, pelvis, calvarium and appendicular skeleton consistent with widespread osseous metastasis.  There is minimal uptake within the kidneys due to the extensive uptake within the bones.  IMPRESSION:  Diffuse uptake within the axillary and appendicular skeleton including the calvarium consistent with widespread osseous metastasis.  This correlates with the diffuse sclerotic metastasis seen on comparison CT.   Original Report Authenticated By: Genevive Bi, M.D.     ASSESSMENT AND PLAN:   1. Chronic anemia, heme occult positive but no sign of iron deficiency.  EGD/Colonscopy not impressive except for nonspecific descending colon ulcerations; biopsy pending. Most likely chronic anemia is due to chronic kidney disease, chronic disease (metastatic breast CA).  2. Morbid obesity.  3. Left upper quadrant mass (parasplenic), US showed either hematoma or cyst. Pain resolved.  4. Deconditioning:  She is getting home PT/OT.  5. Grade 2 dystolic dysfunction: she is euvolumic today on exam.  She is on Torsemide.  6. Diabetes mellitus, type II. Diet controlled.  7. COPD:  On albuterol prn.  8. Chronic renal insufficiency.  9. Metastatic breast cancer (ER pos/PR pos/ HER2 neg) with spread to bone.  -  Work up:  Consider brain imaging soon to rule out brain met.   She could not tolerate MRI.  I requested a noncontrast CT head.  - Treatment recommendation:  Assuming no brain met.  If there is brain met, she would benefit from radiation.   There is no role for breast cancer resection once  it has spread. She does not want any type of surgery anyway.   If bone met becomes painful in the future, we can consider spot radiation. She does not have visceral met; therefore, I recommended to hold off on systemic chemo.   *  Oral antiestrogen therapy:  Aromatase inhibitor (such as Ferama or Arimidex) once daily.   Potential side effects include but not limited to hot flash, night sweat, bone/muscle pain, osteoporosis.  She expressed informed understanding and wished to start.  I wrote for Femara 2.5mg  PO daily.  I requested a baseline bone density scan in case she develops osteoporosis in the future.   *  Follow up:  Breast exam to see whether cancer is shrinking.  I referred her to baseline mammogram to assess size of her breast tumor. Repeat CT scan after about 4-6 months to ensure stability of disease.  *  Consider Zometa IV therapy once a month to decrease risk of bone fracture/pain.  Potential side effects:  Osteonecrosis of the jaw.  She is edentulous.  She would like to think  this over and let us know whether to start this at next visit in a month.   10.   Follow up:  In about 1 month.      The length of time of the face-to-face encounter was 40 minutes. More than 50% of time was spent counseling and coordination of care.

## 2012-10-24 NOTE — Telephone Encounter (Signed)
I spoke with Anita [name] a few days ago (while I was on vacation) and gave verbal order.

## 2012-10-27 ENCOUNTER — Telehealth: Payer: Self-pay | Admitting: Pulmonary Disease

## 2012-10-27 NOTE — Telephone Encounter (Signed)
ONO with RA 10/23/12 >> Test time 7 hrs 53 min.  Mean SpO2 89.3%, low SpO2 66%.  Spent 46 min with SpO2 < 88%.  Will have my nurse inform patient that oxygen level remains low at night.  She needs to continue using supplemental oxygen with sleep.

## 2012-10-28 NOTE — Telephone Encounter (Signed)
I spoke with patient about results and she verbalized understanding and had no questions 

## 2012-10-30 ENCOUNTER — Ambulatory Visit (HOSPITAL_COMMUNITY): Payer: Medicaid Other | Attending: Cardiology | Admitting: Radiology

## 2012-10-30 VITALS — Ht 62.0 in | Wt 225.0 lb

## 2012-10-30 DIAGNOSIS — I509 Heart failure, unspecified: Secondary | ICD-10-CM | POA: Insufficient documentation

## 2012-10-30 DIAGNOSIS — R0602 Shortness of breath: Secondary | ICD-10-CM

## 2012-10-30 MED ORDER — TECHNETIUM TC 99M SESTAMIBI GENERIC - CARDIOLITE
11.0000 | Freq: Once | INTRAVENOUS | Status: AC | PRN
  Administered 2012-10-30: 11 via INTRAVENOUS

## 2012-10-30 MED ORDER — TECHNETIUM TC 99M SESTAMIBI GENERIC - CARDIOLITE
33.0000 | Freq: Once | INTRAVENOUS | Status: AC | PRN
  Administered 2012-10-30: 33 via INTRAVENOUS

## 2012-10-30 MED ORDER — REGADENOSON 0.4 MG/5ML IV SOLN
0.4000 mg | Freq: Once | INTRAVENOUS | Status: AC
  Administered 2012-10-30: 0.4 mg via INTRAVENOUS

## 2012-10-30 NOTE — Progress Notes (Signed)
[facility] SITE 3 NUCLEAR MED [address] [place], [place] [address] [phone]    Cardiology Nuclear Med Study  Anita [name] is a 67 y.o. female     MRN : [id number]     DOB: Jan 24, 1946  Procedure Date: 10/30/2012  Nuclear Med Background Indication for Stress Test:  Evaluation for Ischemia, and 08-2012 Hospitalized with CHF; COPD Exacerbation, and NSTEMI, probable demand ischemia in setting of severe anemia History: 08-2012 CT: positive for calcification, 08-2012 NSTEMI, and 09-25-12 Echo: EF=55%  Cardiac Risk Factors: Family History - CAD, History of Smoking and NIDDM  Symptoms: Chest pain with deep breath @ present,  DOE and SOB   Nuclear Pre-Procedure Caffeine/Decaff Intake:  None NPO After: 5:00pm   Lungs:  clear O2 Sat: 91-94% on room air. IV 0.9% NS with Angio Cath:  22g  IV Site: L Hand  IV Started by:  Stanton Kidney, EMT-P  Chest Size (in):  48 Cup Size: B  Height: 5\' 2"  (1.575 m)  Weight:  225 lb (102.059 kg)  BMI:  Body mass index is 41.14 kg/(m^2). Tech Comments:  Meds were taken as directed at 8am, per patient.    Nuclear Med Study 1 or 2 day study: 1 day  Stress Test Type:  Lexiscan  Reading MD: Cassell Clement, MD  Order Authorizing Provider:  Rollene Rotunda, MD, and Norma Fredrickson, NP  Resting Radionuclide: Technetium 31m Sestamibi  Resting Radionuclide Dose: 11.0 mCi   Stress Radionuclide:  Technetium 27m Sestamibi  Stress Radionuclide Dose: 33.0 mCi           Stress Protocol Rest HR: 76 Stress HR: 100  Rest BP: 114/47 Stress BP: 107/45  Exercise Time (min): n/a METS: n/a   Predicted Max HR: 155 bpm % Max HR: 64.52 bpm Rate Pressure Product: 21308   Dose of Adenosine (mg):  n/a Dose of Lexiscan: 0.4 mg  Dose of Atropine (mg): n/a Dose of Dobutamine: n/a mcg/kg/min (at max HR)  Stress Test Technologist: Irean Hong, RN  Nuclear Technologist:  Domenic Polite, CNMT     Rest Procedure:  Myocardial perfusion imaging was performed at  rest 45 minutes following the intravenous administration of Technetium 76m Sestamibi. Rest ECG: NSR - Normal EKG  Stress Procedure:  The patient received IV Lexiscan 0.4 mg over 15-seconds.  Technetium 43m Sestamibi injected at 30-seconds. The patient complained of nausea, and chest pain with lexiscan. Quantitative spect images were obtained after a 45 minute delay. Stress ECG: No significant change from baseline ECG  QPS Raw Data Images:  Normal; no motion artifact; normal heart/lung ratio. Stress Images:  There is decreased uptake in the inferior wall. Rest Images:  There is decreased uptake in the inferior wall. Subtraction (SDS):  There is a fixed defect that is most consistent with a previous infarction. Transient Ischemic Dilatation (Normal <1.22):  1.05 Lung/Heart Ratio (Normal <0.45):  0.39  Quantitative Gated Spect Images QGS EDV:  106 ml QGS ESV:  54 ml  Impression Exercise Capacity:  Lexiscan with no exercise. BP Response:  Normal blood pressure response. Clinical Symptoms:  Mild chest pain/dyspnea. ECG Impression:  No significant ST segment change suggestive of ischemia. Comparison with Prior Nuclear Study: No previous nuclear study performed  Overall Impression:  Low risk stress nuclear study. Old inferior wall scar of small size involving the apical inferior, mid-inferior and basal inferior segments.  No significant ischemia. SDS-3  LV Ejection Fraction: 49%.  LV Wall Motion:  Inferior wall hypokinesis  Cassell Clement

## 2012-11-05 ENCOUNTER — Ambulatory Visit (HOSPITAL_COMMUNITY)
Admission: RE | Admit: 2012-11-05 | Discharge: 2012-11-05 | Disposition: A | Discharge status: Home / Self Care | Payer: Medicaid Other | Source: Ambulatory Visit | Attending: Oncology | Admitting: Oncology

## 2012-11-05 DIAGNOSIS — C801 Malignant (primary) neoplasm, unspecified: Secondary | ICD-10-CM | POA: Insufficient documentation

## 2012-11-05 DIAGNOSIS — C50911 Malignant neoplasm of unspecified site of right female breast: Secondary | ICD-10-CM

## 2012-11-05 DIAGNOSIS — M899 Disorder of bone, unspecified: Secondary | ICD-10-CM | POA: Insufficient documentation

## 2012-11-05 DIAGNOSIS — C50919 Malignant neoplasm of unspecified site of unspecified female breast: Secondary | ICD-10-CM | POA: Insufficient documentation

## 2012-11-05 DIAGNOSIS — G319 Degenerative disease of nervous system, unspecified: Secondary | ICD-10-CM | POA: Insufficient documentation

## 2012-11-06 ENCOUNTER — Telehealth: Payer: Self-pay | Admitting: Family Medicine

## 2012-11-06 ENCOUNTER — Encounter: Payer: Self-pay | Admitting: Oncology

## 2012-11-06 NOTE — Telephone Encounter (Signed)
Nurse is asking for an order for a humidifier for her o2 consitrator.  They would like it faxed and the nurse would like a call with an ok that it happened.  Also needs a water trap on the order as well.

## 2012-11-06 NOTE — Telephone Encounter (Signed)
Left message for Anita [name] at [facility] that ok for humidifier. Proposito, Virgel Bouquet

## 2012-11-07 ENCOUNTER — Ambulatory Visit
Admission: RE | Admit: 2012-11-07 | Discharge: 2012-11-07 | Disposition: A | Discharge status: Home / Self Care | Payer: Medicare Other | Source: Ambulatory Visit | Attending: Oncology | Admitting: Oncology

## 2012-11-07 ENCOUNTER — Other Ambulatory Visit: Payer: Medicare Other

## 2012-11-07 ENCOUNTER — Other Ambulatory Visit: Payer: Self-pay

## 2012-11-07 ENCOUNTER — Other Ambulatory Visit: Payer: Self-pay | Admitting: Oncology

## 2012-11-07 DIAGNOSIS — C50911 Malignant neoplasm of unspecified site of right female breast: Secondary | ICD-10-CM

## 2012-11-13 ENCOUNTER — Telehealth: Payer: Self-pay | Admitting: *Deleted

## 2012-11-13 ENCOUNTER — Encounter: Payer: Self-pay | Admitting: Pulmonary Disease

## 2012-11-13 ENCOUNTER — Ambulatory Visit (INDEPENDENT_AMBULATORY_CARE_PROVIDER_SITE_OTHER): Payer: Medicare Other | Admitting: Family Medicine

## 2012-11-13 ENCOUNTER — Encounter: Payer: Self-pay | Admitting: Family Medicine

## 2012-11-13 VITALS — BP 130/82 | HR 85 | Ht 62.0 in | Wt 224.0 lb

## 2012-11-13 DIAGNOSIS — D649 Anemia, unspecified: Secondary | ICD-10-CM

## 2012-11-13 DIAGNOSIS — J4489 Other specified chronic obstructive pulmonary disease: Secondary | ICD-10-CM

## 2012-11-13 DIAGNOSIS — D638 Anemia in other chronic diseases classified elsewhere: Secondary | ICD-10-CM

## 2012-11-13 DIAGNOSIS — I2510 Atherosclerotic heart disease of native coronary artery without angina pectoris: Secondary | ICD-10-CM

## 2012-11-13 DIAGNOSIS — J9610 Chronic respiratory failure, unspecified whether with hypoxia or hypercapnia: Secondary | ICD-10-CM

## 2012-11-13 DIAGNOSIS — I509 Heart failure, unspecified: Secondary | ICD-10-CM

## 2012-11-13 DIAGNOSIS — N189 Chronic kidney disease, unspecified: Secondary | ICD-10-CM

## 2012-11-13 DIAGNOSIS — I5032 Chronic diastolic (congestive) heart failure: Secondary | ICD-10-CM

## 2012-11-13 DIAGNOSIS — C7951 Secondary malignant neoplasm of bone: Secondary | ICD-10-CM

## 2012-11-13 DIAGNOSIS — E119 Type 2 diabetes mellitus without complications: Secondary | ICD-10-CM

## 2012-11-13 DIAGNOSIS — C50919 Malignant neoplasm of unspecified site of unspecified female breast: Secondary | ICD-10-CM

## 2012-11-13 LAB — BASIC METABOLIC PANEL
BUN: 33 mg/dL — ABNORMAL HIGH (ref 6–23)
Creat: 1.05 mg/dL (ref 0.50–1.10)
Glucose, Bld: 118 mg/dL — ABNORMAL HIGH (ref 70–99)
Potassium: 4 mEq/L (ref 3.5–5.3)

## 2012-11-13 LAB — CBC
HCT: 25.2 % — ABNORMAL LOW (ref 36.0–46.0)
Hemoglobin: 8.3 g/dL — ABNORMAL LOW (ref 12.0–15.0)
MCH: 26.8 pg (ref 26.0–34.0)
MCHC: 32.9 g/dL (ref 30.0–36.0)
MCV: 81.3 fL (ref 78.0–100.0)
RDW: 19 % — ABNORMAL HIGH (ref 11.5–15.5)

## 2012-11-13 MED ORDER — TORSEMIDE 20 MG PO TABS: 40.0000 mg | ORAL_TABLET | Freq: Every day | ORAL | Status: DC

## 2012-11-13 NOTE — Patient Instructions (Addendum)
It was nice seeing you today.  I am increasing your Torsemide to 40 mg daily.  I am also checking some lab work today.  I will send a letter results (or will call if its something urgent).   Please be sure to follow up with me in 1 month.

## 2012-11-13 NOTE — Telephone Encounter (Signed)
Pt calling to question Zometa appts,  States she has not been told anything about zometa.  Explained to pt she will see Clenton Pare first and can discuss possible zometa treatment at that time.  Appt can always be canceled if pt does not agree to this treatment.  She verbalized understanding and also reports had her labs done w/ Dr. Adriana Simas today so asks if labs can be canceled for 3/27?  Informed pt will wait for results of labs today and then can cancel for 3/27 if they are ok.  She verbalized understanding.

## 2012-11-13 NOTE — Assessment & Plan Note (Signed)
Recent nuclear stress test revealed no signs of ischemia.  Will continue ASA 81 daily.  Will add Beta blocker and ACEI if pressure allows. Followed by Cardiology, Dr. Antoine Poche.

## 2012-11-13 NOTE — Assessment & Plan Note (Signed)
Currently being managed by Pulmonology in the setting of chronic respiratory failure. Currently stable and only on albuterol.  Will add spiriva and/or corticosteroids if needed.

## 2012-11-13 NOTE — Progress Notes (Signed)
Subjective:     Patient ID: Anita [name], female   DOB: 1946/06/07, 67 y.o.   MRN: [id number]  HPI Anita [name] is a 67 year old female who presents for followup. Patient recently discharged from hospital following admission for worsening anemia (discharged 10/14/12)  1) Anemia of chronic disease - Patient has known CKD - During admission, received transfusion 4 U PRBC and underwent EGD and colonoscopy (both studies were negative). - ROS: denies blood loss/blood in stool.  Reports SOB with activity.  2) Chronic respiratory failure and COPD  - Currently stable. - Patient followed by Pulmonology Dr. Craige Cotta - Currently only on Albuterol PRN and 2L [place] at night. - ROS: Reports SOB with activity.  3) Metastatic breast Cancer - Managed by Dr. Gaylyn Rong - Patient recently started on Fumera. - CT scan confirmed bone metastasis  - ROS: Patient denies pain this am.  4) Chronic diastolic CHF - Currently on Torsemide 20 mg daily - Reports recent increase in lower extremity edema, particularly Right - ROS: Denies worsening SOB.   5) CAD  - Patient followed by Cardiology, Dr. Antoine Poche - Currently on Aspirin - Recent Nuclear stress test done - Low risk stress nuclear study. Old inferior wall scar of small size involving the apical inferior, mid-inferior and basal inferior segments. No significant ischemia. - ROS: Denies chest pain   6) CKD - stage 3: Baseline Creatinine ~ 1.5 - Last BMP revealed Creatinine of 1.51 (09/2012)  7) DM-2 - Stable. Last A1C 6.6. - Patient reports compliance with recommended diet   Past Medical History  Diagnosis Date  . Fibroid   . Morbid obesity   . 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 COPD (chronic obstructive pulmonary disease)   . Diastolic heart failure   . NSTEMI (non-ST elevated myocardial infarction)   . Anemia   . Breast cancer   . Ulcers of both lower legs   . Tobacco abuse    Past Surgical History  Procedure  Laterality Date  . Colonoscopy, esophagogastroduodenoscopy (egd) and esophageal dilation N/A 10/13/2012    Procedure: colonoscopy and egd;  Surgeon: Graylin Shiver, MD;  Location: [facility] ENDOSCOPY;  Service: Endoscopy;  Laterality: N/A;    Review of Systems See HPI.      Objective:   Physical Exam  Filed Vitals:   11/13/12 1416  BP: 130/82  Pulse: 85   General: well appearing, NAD. Speaking in full sentences. Heart: RRR, no murmurs, rubs, or gallops. Lungs: Bibasilar rales noted. Abdomen: soft, nontender, nondistended. No organomegaly. Extremities: 1+ lower extremity edema noted.     Assessment:         Plan:

## 2012-11-13 NOTE — Assessment & Plan Note (Signed)
BMP today to evaluate creatinine and potassium.

## 2012-11-13 NOTE — Assessment & Plan Note (Signed)
Checking CBC today 

## 2012-11-13 NOTE — Assessment & Plan Note (Signed)
Stable at this time. A1C due in April.

## 2012-11-13 NOTE — Assessment & Plan Note (Signed)
Currently stable.  Patient requires 2L [place] at night.

## 2012-11-13 NOTE — Assessment & Plan Note (Signed)
Torsemide increased to 40 mg daily. Checking BMP today to evaluate renal function and potassium. Patient to follow up in 1 month.

## 2012-11-14 ENCOUNTER — Telehealth: Payer: Self-pay | Admitting: Family Medicine

## 2012-11-14 NOTE — Telephone Encounter (Signed)
Called diane and gave verbal ok for this.

## 2012-11-14 NOTE — Telephone Encounter (Signed)
OT from [facility] is calling to extend therapy 2wk2 to address energy conservation and ADL's.  A verbal ok is ok.

## 2012-11-16 ENCOUNTER — Other Ambulatory Visit: Payer: Self-pay | Admitting: Oncology

## 2012-11-18 ENCOUNTER — Encounter: Payer: Self-pay | Admitting: Family Medicine

## 2012-11-20 ENCOUNTER — Telehealth: Payer: Self-pay | Admitting: Oncology

## 2012-11-20 ENCOUNTER — Ambulatory Visit (HOSPITAL_BASED_OUTPATIENT_CLINIC_OR_DEPARTMENT_OTHER): Payer: Medicaid Other | Admitting: Oncology

## 2012-11-20 ENCOUNTER — Telehealth: Payer: Self-pay | Admitting: *Deleted

## 2012-11-20 ENCOUNTER — Ambulatory Visit: Payer: Medicare Other

## 2012-11-20 ENCOUNTER — Other Ambulatory Visit: Payer: Medicare Other | Admitting: Lab

## 2012-11-20 ENCOUNTER — Encounter: Payer: Self-pay | Admitting: Oncology

## 2012-11-20 VITALS — BP 117/61 | HR 107 | Temp 97.0°F | Resp 19 | Ht 62.0 in | Wt 223.6 lb

## 2012-11-20 DIAGNOSIS — D638 Anemia in other chronic diseases classified elsewhere: Secondary | ICD-10-CM

## 2012-11-20 DIAGNOSIS — C50919 Malignant neoplasm of unspecified site of unspecified female breast: Secondary | ICD-10-CM

## 2012-11-20 DIAGNOSIS — C7951 Secondary malignant neoplasm of bone: Secondary | ICD-10-CM

## 2012-11-20 NOTE — Progress Notes (Signed)
[facility]  Telephone:([phone] [phone]   OFFICE PROGRESS NOTE   Cc:  Everlene Other, DO  DIAGNOSIS: metastatic mammary carcinoma with met to bones.  ER 100%/ PR 0%/Her2 neu negative.   PAST THERAPY:  Biopsy only   CURRENT THERAPY: Started letrozole in February 2014.   INTERVAL HISTORY: Anita [name] 67 y.o. female returns for routine follow-up by herself.  She reported that she has DOE walking about 20 feet; this is unchanged. She has mild fatigue; however, she is able to cook and perform light cleaning.  She still has a large right breast mass.  She denied any pain or bleeding from the breast mass. She has bilateral knee pain requiring her to use wheelchair for long distance ambulation.  However, she is able to walk on her own covering short distance at home.  Her left upper quadrant abdominal pain has completely resolved.   Patient denies fever, anorexia, weight loss, headache, visual changes, confusion, drenching night sweats, palpable lymph node swelling, mucositis, odynophagia, dysphagia, nausea vomiting, jaundice, chest pain, palpitation, productive cough, gum bleeding, epistaxis, hematemesis, hemoptysis, abdominal pain, abdominal swelling, early satiety, melena, hematochezia, hematuria, skin rash, spontaneous bleeding, heat or cold intolerance, bowel bladder incontinence, back pain, focal motor weakness, paresthesia, depression.    Past Medical History  Diagnosis Date  . Fibroid   . Morbid obesity   . 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 COPD (chronic obstructive pulmonary disease)   . Diastolic heart failure   . NSTEMI (non-ST elevated myocardial infarction)   . Anemia   . Breast cancer   . Ulcers of both lower legs   . Tobacco abuse     Past Surgical History  Procedure Laterality Date  . Colonoscopy, esophagogastroduodenoscopy (egd) and esophageal dilation N/A 10/13/2012    Procedure: colonoscopy and  egd;  Surgeon: Graylin Shiver, MD;  Location: [facility] ENDOSCOPY;  Service: Endoscopy;  Laterality: N/A;    Current Outpatient Prescriptions  Medication Sig Dispense Refill  . albuterol (PROVENTIL HFA;VENTOLIN HFA) 108 (90 BASE) MCG/ACT inhaler Inhale 1 puff into the lungs 2 (two) times daily as needed for wheezing or shortness of breath.      [name] aspirin EC 81 MG EC tablet Take 1 tablet (81 mg total) by mouth daily.  30 tablet  3  . atorvastatin (LIPITOR) 40 MG tablet Take 1 tablet (40 mg total) by mouth daily at 6 PM.  30 tablet  3  . Calcium Citrate (CALCITRATE PO) Take 1 tablet by mouth 2 (two) times daily.       [name] letrozole (FEMARA) 2.5 MG tablet Take 1 tablet (2.5 mg total) by mouth daily.  30 tablet  5  . torsemide (DEMADEX) 20 MG tablet Take 2 tablets (40 mg total) by mouth daily.  60 tablet  3   No current facility-administered medications for this visit.    ALLERGIES:  is allergic to omnipaque and benzene.  REVIEW OF SYSTEMS:  The rest of the 14-point review of system was negative.   Filed Vitals:   11/20/12 1500  BP: 117/61  Pulse: 107  Temp: 97 F (36.1 C)  Resp: 19   Wt Readings from Last 3 Encounters:  11/20/12 223 lb 9.6 oz (101.424 kg)  11/13/12 224 lb (101.606 kg)  10/30/12 225 lb (102.059 kg)   ECOG Performance status: 1 due to obesity, decondition, bilateral knee pain.   PHYSICAL EXAMINATION:   General:  Obese woman, in no acute distress.  Eyes:  no scleral icterus.  ENT:  There were no oropharyngeal lesions.  Neck was without thyromegaly.  Lymphatics:  Negative cervical, supraclavicular or axillary adenopathy.  Respiratory: lungs were clear bilaterally without wheezing or crackles.  Cardiovascular:  Regular rate and rhythm, S1/S2, without murmur, rub or gallop.  There was no pedal edema.  GI:  abdomen was soft, flat, nontender, obese, without organomegaly. There was no pain to palpation of LUQ area.  Muscoloskeletal:  no spinal tenderness of palpation of vertebral  spine.  Skin exam was without echymosis, petichae.  Neuro exam was nonfocal. Patient was alert and oriented.  Attention was good.   Language was appropriate.  Mood was normal without depression.  Speech was not pressured.  Thought content was not tangential.  Right breast exam showed a right lower outer quadrant breast mass about 5cm.     LABORATORY/RADIOLOGY DATA:  Lab Results  Component Value Date   WBC 6.8 11/13/2012   HGB 8.3* 11/13/2012   HCT 25.2* 11/13/2012   PLT 258 11/13/2012   GLUCOSE 118* 11/13/2012   ALKPHOS 139* 10/17/2012   ALT 18 10/17/2012   AST 18 10/17/2012   NA 138 11/13/2012   K 4.0 11/13/2012   CL 101 11/13/2012   CREATININE 1.05 11/13/2012   BUN 33* 11/13/2012   CO2 29 11/13/2012   INR 0.97 10/10/2012   HGBA1C 6.6* 09/25/2012   IMAGING:   *RADIOLOGY REPORT*  Clinical Data: Breast cancer with metastatic disease.  CT HEAD WITHOUT CONTRAST  Technique: Contiguous axial images were obtained from the base of  the skull through the vertex without contrast.  Comparison: None  Findings: Image quality degraded by patient motion.  Generalized mild atrophy. Ventricle size is normal. Negative for  acute infarct. Negative for hemorrhage or mass. No edema or  midline shift. Intravenous contrast was not given which would  increase the sensitivity for detection of metastatic disease to the  brain.  There are multiple round low density lesions throughout the  calvarium bilaterally. This is most likely due to metastatic  disease. Myeloma could have a similar appearance.  IMPRESSION:  No acute intracranial abnormality.  Multiple skull lesions, consistent with metastatic disease or  myeloma.  Original Report Authenticated By: Janeece Riggers, M.D.  *RADIOLOGY REPORT*  Clinical Data: 67 year old patient with recent diagnosis of right  breast cancer, with multiple bony metastases. The diagnosis was  made during a recent hospitalization. This is the patient's  baseline mammogram. She  reports that she is taking Femara.  DIGITAL DIAGNOSTIC BILATERAL MAMMOGRAM WITH CAD AND RIGHT BREAST  ULTRASOUND:  Comparison: None.  Findings:  ACR Breast Density Category 3: The breast tissue is heterogeneously  dense.  There is a large area of asymmetric density with slight distortion  in the upper central right breast compared to the left breast. This  area is indistinct and no mass-like, but is estimated to measure  approximately 12 cm greatest diameter on the CC view. There are  linear, branching, and pleomorphic calcifications in the far upper  outer right breast that span 3.2 x 1.6 x 1.7 cm. 4.5 cm medial and  anterior to these pleomorphic malignant type calcifications is a 5  mm cluster of punctate calcifications. There is abnormal skin  thickening of the anterior periareolar right breast.  In the upper outer left breast is a 5 mm cluster of punctate  calcifications which have benign appearances. No mass, asymmetry,  or distortion is identified in  the left breast.  Mammographic images were processed with CAD.  On physical exam, there is a large, firm palpable mass in the upper  right breast spanning from approximately 9 o'clock position to the  1 o'clock position.  Ultrasound is performed, showing diffuse, irregular  hypoechogenicity corresponding to the palpable area of concern,  that results in marked posterior acoustic shadowing. The area of  abnormal hypoechogenicity is much greater than can be imaged on the  ultrasound screen at one time. I marked the skin of the breast  along the medial and lateral margins of the abnormal  hypoechogenicity spanning from approximately 9 o'clock one o'clock  position. This area measures 13 cm greatest diameter, measured  with a ruler placed on the skin surface.  Two right axillary lymph nodes are imaged that have a hypoechoic  thickened cortices, but preserved fatty hila. Metastatic  involvement cannot be excluded.  IMPRESSION:  1.  Biopsy-proven malignancy in the upper right breast. Abnormal  hypoechogenicity in the upper right breast spans approximately 13  cm.  2. No definite evidence of malignancy in the left breast. A small  cluster of punctate calcifications in the left breast is likely  benign.  3. Two right axillary lymph nodes demonstrate hypoechoic and  slightly thickened cortices, for which metastatic involvement  cannot be excluded.  RECOMMENDATION:  Follow-up imaging as clinically indicated.  I have discussed the findings and recommendations with the patient.  Results were also provided in writing at the conclusion of the  visit.  BI-RADS CATEGORY 6: Known biopsy-proven malignancy - appropriate  action should be taken.  Original Report Authenticated By: Britta Mccreedy, M.D.   ASSESSMENT AND PLAN:   1. Chronic anemia, heme occult positive but no sign of iron deficiency. EGD/Colonscopy not impressive except for nonspecific descending colon ulcerations; biopsy pending. Most likely chronic anemia is due to chronic kidney disease, chronic disease (metastatic breast CA). Monitor CBC monthly. Transfuse for Hgb <7. 2. Morbid obesity.  3. Left upper quadrant mass (parasplenic), US showed either hematoma or cyst. Pain resolved.  4. Deconditioning:  She is getting home PT/OT.  5. Grade 2 dystolic dysfunction: she is euvolumic today on exam.  She is on Torsemide.  6. Diabetes mellitus, type II. Diet controlled.  7. COPD:  On albuterol prn.  8. Chronic renal insufficiency.  9. Metastatic breast cancer (ER pos/PR pos/ HER2 neg) with spread to bone.  -  Treatment: There is no role for breast cancer resection once it has spread. She does not want any type of surgery anyway.   If bone met becomes painful in the future, we can consider spot radiation. She does not have visceral met; therefore, I recommended to hold off on systemic chemo.   *  Oral antiestrogen therapy: Femara 2.5 mg daily.  She has been on this medication  one month and is tolerating well. Recommend that she proceed with Letrozole without dose modification.  *  Follow up:  Repeat CT scan after about 4-6 months to ensure stability of disease.  *  Discussed use of Zometa with her to reduce risk of fracture given the fact that he has bone mets. She does not want to proceed with this today. I have given her written information regarding this drug and she will think about it. 10.   Follow up:  In about 2.5 months.      The length of time of the face-to-face encounter was 30 minutes. More than 50% of time was spent counseling and  coordination of care.

## 2012-11-20 NOTE — Telephone Encounter (Signed)
Per staff phone call and POF I have schedueld appts.  JMW  

## 2012-11-20 NOTE — Telephone Encounter (Signed)
Gave pt appt for lab MD, Zometa and nutrition appt per pt request

## 2012-12-01 ENCOUNTER — Encounter: Payer: Self-pay | Admitting: Cardiology

## 2012-12-01 ENCOUNTER — Ambulatory Visit (INDEPENDENT_AMBULATORY_CARE_PROVIDER_SITE_OTHER): Payer: Medicare Other | Admitting: Cardiology

## 2012-12-01 VITALS — BP 150/74 | HR 64 | Ht 62.0 in | Wt 228.0 lb

## 2012-12-01 DIAGNOSIS — I2789 Other specified pulmonary heart diseases: Secondary | ICD-10-CM

## 2012-12-01 DIAGNOSIS — I5032 Chronic diastolic (congestive) heart failure: Secondary | ICD-10-CM

## 2012-12-01 DIAGNOSIS — IMO0002 Reserved for concepts with insufficient information to code with codable children: Secondary | ICD-10-CM

## 2012-12-01 DIAGNOSIS — F17200 Nicotine dependence, unspecified, uncomplicated: Secondary | ICD-10-CM

## 2012-12-01 DIAGNOSIS — I509 Heart failure, unspecified: Secondary | ICD-10-CM

## 2012-12-01 DIAGNOSIS — Z720 Tobacco use: Secondary | ICD-10-CM

## 2012-12-01 NOTE — Patient Instructions (Addendum)
The current medical regimen is effective;  continue present plan and medications.  Follow up in 1 year with Dr Hochrein.  You will receive a letter in the mail 2 months before you are due.  Please call us when you receive this letter to schedule your follow up appointment.  

## 2012-12-01 NOTE — Progress Notes (Signed)
HPI  The patient presents for follow up after a hospitalization for treatment of dyspnea and anasarca. She had a non-Q-wave microinfarction at bedtime which was felt to be secondary. She's being managed for metastatic breast cancer.  She did have an outpatient low risk stress nuclear study with an old inferior wall scar of small size involving the apical inferior, mid-inferior and basal inferior segments. No significant ischemia and an LV Ejection Fraction of 49%.   Her ejection fraction on echo in the hospital was 55%. She had evidence of diastolic dysfunction. I have reviewed all of these records. I have reviewed more recent hospitalizations with anemia.  From a cardiac standpoint she's not having any acute problems. She does get dyspnea with moderate exertion but this is much improved compared with her presentation in January. She did have wheezing at that time but she's not doing this currently. She wears oxygen at night. She otherwise doesn't have any severe shortness of breath. She says her swelling is better than previous. However, her weight is up a few pounds from her lowest weight in the last few months. She's not describing any chest pressure, neck or arm discomfort. She's not describing any palpitations, presyncope or syncope.  Allergies  Allergen Reactions  . Omnipaque (Iohexol)     Pt claims she developed hives after given contrast  . Benzene Rash    Current Outpatient Prescriptions  Medication Sig Dispense Refill  . albuterol (PROVENTIL HFA;VENTOLIN HFA) 108 (90 BASE) MCG/ACT inhaler Inhale 1 puff into the lungs 2 (two) times daily as needed for wheezing or shortness of breath.      [name] aspirin EC 81 MG EC tablet Take 1 tablet (81 mg total) by mouth daily.  30 tablet  3  . atorvastatin (LIPITOR) 40 MG tablet Take 1 tablet (40 mg total) by mouth daily at 6 PM.  30 tablet  3  . Calcium Citrate (CALCITRATE PO) Take 1 tablet by mouth 2 (two) times daily.       [name] letrozole (FEMARA) 2.5  MG tablet Take 1 tablet (2.5 mg total) by mouth daily.  30 tablet  5  . Potassium Chloride (KLOR-CON PO) Take by mouth daily.      [name] torsemide (DEMADEX) 20 MG tablet Take 2 tablets (40 mg total) by mouth daily.  60 tablet  3   No current facility-administered medications for this visit.    Past Medical History  Diagnosis Date  . Fibroid   . Morbid obesity   . 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 COPD (chronic obstructive pulmonary disease)   . Diastolic heart failure   . NSTEMI (non-ST elevated myocardial infarction)   . Anemia   . Breast cancer   . Ulcers of both lower legs   . Tobacco abuse     Past Surgical History  Procedure Laterality Date  . Colonoscopy, esophagogastroduodenoscopy (egd) and esophageal dilation N/A 10/13/2012    Procedure: colonoscopy and egd;  Surgeon: Graylin Shiver, MD;  Location: [facility] ENDOSCOPY;  Service: Endoscopy;  Laterality: N/A;    ROS:  As stated in the HPI and negative for all other systems.  PHYSICAL EXAM BP 150/74  Pulse 64  Ht 5\' 2"  (1.575 m)  Wt 228 lb (103.42 kg)  BMI 41.69 kg/m2 GENERAL:  Well appearing HEENT:  Pupils equal round and reactive, fundi not visualized, oral mucosa unremarkable NECK:  No jugular venous distention, waveform within normal limits, carotid  upstroke brisk and symmetric, no bruits, no thyromegaly LYMPHATICS:  No cervical, inguinal adenopathy LUNGS:  Clear to auscultation bilaterally BACK:  No CVA tenderness CHEST:  Unremarkable HEART:  PMI not displaced or sustained,S1 and S2 within normal limits, no S3, no S4, no clicks, no rubs, no murmurs ABD:  Flat, positive bowel sounds normal in frequency in pitch, no bruits, no rebound, no guarding, no midline pulsatile mass, no hepatomegaly, no splenomegaly, morbidly obese EXT:  2 plus pulses throughout, mild bilateraledema, no cyanosis no clubbing   ASSESSMENT AND PLAN  NQWMI:  This was probably secondary to acute volume overload  with diastolic dysfunction. Stress test was low risk.  She's not having any ongoing symptoms. No further cardiovascular testing is suggested.  ANASARCA:  She doesn't have a lot of edema though her weight is up. We discussed salt and fluid restriction. She is having her medications adjusted by her primary provider. No change in therapy is indicated.  CHRNOIC DIASTOLIC HF:  As above.

## 2012-12-03 ENCOUNTER — Telehealth: Payer: Self-pay | Admitting: Family Medicine

## 2012-12-03 NOTE — Telephone Encounter (Signed)
Patient called back.  C/o having "bright red bleeding" since yesterday.  Patient describes the bleeding as "a flow almost like a period," but denies having to wear a sanitary pad.  Patient states she has breast cancer and has been taking Femara since 09/2012.  Patient has called oncologist (Dr. Gaylyn Rong) at [facility] and left message with his nurse, but wanted Dr. Adriana Simas to be aware of the bleeding.   Awaiting call back from their office.  Patient will call our office back if she does not hear back from Dr. Lodema Pilot office by tomorrow.   Gaylene Brooks, RN

## 2012-12-03 NOTE — Telephone Encounter (Signed)
Pt is needing to speak to nurse - she has started bleeding vaginally and is not sure if she needs to come in.  Has an appt on 4/17 but wanted to speak to nurse first

## 2012-12-03 NOTE — Telephone Encounter (Signed)
Returned call to patient and left message to call our office back.  Richardson, Jeannette Ann, RN  

## 2012-12-11 ENCOUNTER — Ambulatory Visit (INDEPENDENT_AMBULATORY_CARE_PROVIDER_SITE_OTHER): Payer: Medicare Other | Admitting: Family Medicine

## 2012-12-11 ENCOUNTER — Encounter: Payer: Self-pay | Admitting: Family Medicine

## 2012-12-11 VITALS — BP 118/49 | HR 91 | Ht 60.0 in | Wt 227.0 lb

## 2012-12-11 DIAGNOSIS — J4489 Other specified chronic obstructive pulmonary disease: Secondary | ICD-10-CM

## 2012-12-11 DIAGNOSIS — I509 Heart failure, unspecified: Secondary | ICD-10-CM

## 2012-12-11 DIAGNOSIS — I5032 Chronic diastolic (congestive) heart failure: Secondary | ICD-10-CM

## 2012-12-11 DIAGNOSIS — E119 Type 2 diabetes mellitus without complications: Secondary | ICD-10-CM

## 2012-12-11 DIAGNOSIS — J449 Chronic obstructive pulmonary disease, unspecified: Secondary | ICD-10-CM

## 2012-12-11 LAB — POCT GLYCOSYLATED HEMOGLOBIN (HGB A1C): Hemoglobin A1C: 6.2

## 2012-12-11 MED ORDER — TIOTROPIUM BROMIDE MONOHYDRATE 18 MCG IN CAPS: 18.0000 ug | ORAL_CAPSULE | Freq: Every day | RESPIRATORY_TRACT | Status: DC

## 2012-12-11 NOTE — Assessment & Plan Note (Addendum)
Patient continues to have SOB particularly with exertion.  This is likely multifactorial. Patient is in need of PFT's.  She is managed by pulmonology, so will defer PFT testing at this time (if this is not done at next pulm visit) will refer to Pharm clinic for testing. Given continued SOB, will start empiric therapy today with Spiriva. Patient also in need of Pneumovax.  I forgot to order her vaccine today.  Will be in contact with patient and will arrange nursing visit for Pneumovax.

## 2012-12-11 NOTE — Progress Notes (Addendum)
Subjective:     Patient ID: Anita [name], female   DOB: [birth year], 67 y.o.   MRN: [id number]  HPI Anita [name] presents today for follow up of numerous medical conditions.  1) DM-2 - Diet controlled at this time.  Patient no longer drinking soda and is eating well balanced diet complete with many fruits and vegetables. - Last A1C was 6.6 in January of 2014. - ROS: Reports frequent urination but she is on diuretic.  No polydipsia. No vision changes, dizziness/lightheadedness.  2) Chronic diastolic CHF - Patient continues to report SOB.  Compliant with current medication regimen. - ROS: No chest pain or reports of weight gain.  No worsening LE edema.  No orthopnea or PND.  3) COPD - Patient followed by pulmonology. - Continues to report SOB with exertion.  Patient able to walk about 20 feet before SOB ensues. - Uses Albuterol.  No other medications at this time (patient did not fill medications prior) - Patient has not has PFT's done yet - Continues to use O2 at night (2L)  - ROS: See above.   Social Hx - Now has quit smoking.  Review of Systems See HPI.     Objective:   Physical Exam  Filed Vitals:   12/11/12 1423  BP: 118/49  Pulse: 91   General: well appearing pleasant female in NAD. Heart: RRR, no murmurs, rubs, or gallops. Lungs: Mild bibasilar rales appreciated. Abdomen: soft, nontender, nondistended. Extremities: 1+ pretibial edema appreciated bilaterally. Psych: normal mood and affect.  In good spirits.      Assessment:      Plan:

## 2012-12-11 NOTE — Assessment & Plan Note (Addendum)
Diet controlled.  A1C 6.2 today. Will continue expectant management.  Patient to see dietician about continued good eating habits/weight loss.

## 2012-12-11 NOTE — Assessment & Plan Note (Addendum)
Stable at this time.  Will continue Torsemide 40 mg daily.

## 2012-12-11 NOTE — Patient Instructions (Signed)
You're doing great.    Continue to take your medications as prescribed.  I am starting Spiriva today.  Please be sure to follow up with your pulmonologist.  I will see you back in 6 months or earlier if needed.

## 2012-12-15 ENCOUNTER — Telehealth: Payer: Self-pay | Admitting: *Deleted

## 2012-12-15 NOTE — Telephone Encounter (Signed)
Message copied by Tanna Savoy on Mon Dec 15, 2012  8:51 AM ------      [name]      Created: Thu Dec 11, 2012  7:42 PM       Patient needs nursing visit for Pneumovax.      Please call and set this up with patient. ------

## 2012-12-15 NOTE — Telephone Encounter (Signed)
Left message on voicemail that she's needing to schedule nurse visit for a pneumovax. Lancelot Alyea, Virgel Bouquet

## 2012-12-18 ENCOUNTER — Ambulatory Visit: Payer: Self-pay

## 2012-12-19 ENCOUNTER — Telehealth: Payer: Self-pay | Admitting: *Deleted

## 2012-12-19 DIAGNOSIS — C50911 Malignant neoplasm of unspecified site of right female breast: Secondary | ICD-10-CM

## 2012-12-19 MED ORDER — LETROZOLE 2.5 MG PO TABS: 2.5000 mg | ORAL_TABLET | Freq: Every day | ORAL | Status: DC

## 2012-12-19 NOTE — Telephone Encounter (Signed)
Pt request refill on Femara to be sent to [facility] on [address].   Sent rx to [facility] electronically.

## 2013-01-01 ENCOUNTER — Ambulatory Visit: Payer: Medicare Other | Admitting: Oncology

## 2013-01-01 ENCOUNTER — Ambulatory Visit: Payer: Medicare Other

## 2013-01-01 ENCOUNTER — Other Ambulatory Visit: Payer: Medicare Other | Admitting: Lab

## 2013-01-15 ENCOUNTER — Ambulatory Visit: Payer: Self-pay

## 2013-01-21 ENCOUNTER — Encounter: Payer: Self-pay | Admitting: Pulmonary Disease

## 2013-01-21 ENCOUNTER — Ambulatory Visit (INDEPENDENT_AMBULATORY_CARE_PROVIDER_SITE_OTHER): Payer: Medicaid Other | Admitting: Pulmonary Disease

## 2013-01-21 VITALS — BP 132/56 | HR 92 | Temp 97.9°F | Ht 62.0 in | Wt 229.0 lb

## 2013-01-21 DIAGNOSIS — J449 Chronic obstructive pulmonary disease, unspecified: Secondary | ICD-10-CM

## 2013-01-21 DIAGNOSIS — J9610 Chronic respiratory failure, unspecified whether with hypoxia or hypercapnia: Secondary | ICD-10-CM

## 2013-01-21 NOTE — Assessment & Plan Note (Signed)
Stable on current regimen of spiriva and prn albuterol.  Given status of her breast cancer, I see no utility in subjecting her to additional testing at this time.  Will defer further testing unless her symptoms progress.

## 2013-01-21 NOTE — Assessment & Plan Note (Signed)
She is to continue 2 liters oxygen with sleep.

## 2013-01-21 NOTE — Patient Instructions (Signed)
Follow up in 6 months 

## 2013-01-21 NOTE — Progress Notes (Signed)
Chief Complaint  Patient presents with  . Follow-up    Breathing is unchanged. Reports DOE, coughing and wheezing. Denies chest pain, chest tightness.    CC: [name]  History of Present Illness: Anita [name] is a 68 y.o. female former smoker with COPD and possible sleep apnea.  She also has history of stage IV breast cancer.  She is doing okay.  She denies chest pain.  She gets winded and wheezy when she does strenuous activity.  She does okay with light activity, and at rest.  She uses spiriva everyday and feels this is getting into her lungs well.  She uses proair several times per week.  She is using her oxygen at night.  TESTS: Echo 09/25/12 >> grade 2 diastolic dysfunction, mild RV dilation, EF 55% CT chest 10/08/12 >> 6 mm GGO nodule RUL, diffuse sclerotic changes on bone windows ONO with RA 10/23/12 >> Test time 7 hrs 53 min. Mean SpO2 89.3%, low SpO2 66%. Spent 46 min with SpO2 < 88%.  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  Anita [name]  has past surgical history that includes Colonoscopy, esophagogastroduodenoscopy (egd) and esophageal dilation (N/A, 10/13/2012).  Prior to Admission medications   Medication Sig Start Date End Date Taking? Authorizing Provider  albuterol (PROVENTIL HFA;VENTOLIN HFA) 108 (90 BASE) MCG/ACT inhaler Inhale 1 puff into the lungs 2 (two) times daily as needed for wheezing or shortness of breath.    Historical Provider, MD  aspirin EC 81 MG EC tablet Take 1 tablet (81 mg total) by mouth daily. 10/02/12   Glori Luis, MD  atorvastatin (LIPITOR) 40 MG tablet Take 1 tablet (40 mg total) by mouth daily at 6 PM. 10/02/12   Glori Luis, MD  Calcium Citrate (CALCITRATE PO) Take 1 tablet by  mouth daily.    Historical Provider, MD  pantoprazole (PROTONIX) 40 MG tablet Take 1 tablet (40 mg total) by mouth daily. 10/14/12   Glori Luis, MD  torsemide (DEMADEX) 20 MG tablet Take 1 tablet (20 mg total) by mouth daily. 10/15/12   Glori Luis, MD    Allergies  Allergen Reactions  . Omnipaque (Iohexol)     Pt claims she developed hives after given contrast  . Benzene Rash     Physical Exam:  General - No distress ENT - No sinus tenderness, MP 4, no oral exudate, no LAN Cardiac - s1s2 regular, no murmur Chest - No wheeze/rales/dullness Back - No focal tenderness Abd - Soft, non-tender Ext - 1+ edema Neuro - Normal strength Skin - No rashes Psych - normal mood, and behavior   Assessment/Plan:  Coralyn Helling, MD [facility] Pager:  [phone] 01/21/2013, 12:02 PM

## 2013-02-09 ENCOUNTER — Telehealth: Payer: Self-pay | Admitting: Pulmonary Disease

## 2013-02-09 ENCOUNTER — Telehealth: Payer: Self-pay | Admitting: Cardiology

## 2013-02-09 DIAGNOSIS — R0600 Dyspnea, unspecified: Secondary | ICD-10-CM

## 2013-02-09 NOTE — Telephone Encounter (Signed)
Will forward to Dr Craige Cotta to determine necessity

## 2013-02-09 NOTE — Telephone Encounter (Signed)
Spoke with pt I have notified her that in order for [place] to prescribe the o2, she will need to qualify She verbalized understanding Order was sent to DME to have her evaluated for daytime o2 Nothing further needed per pt

## 2013-02-09 NOTE — Telephone Encounter (Signed)
Please either arrange for her DME to assess her daytime oxygen needs, or have pt come to office for nursing visit to assess oxygen needs.  Please then inform me what her daytime O2 needs are, and I will then place order for daytime supplemental oxygen.

## 2013-02-09 NOTE — Telephone Encounter (Signed)
New Prob     Pt states she is needing oxygen during the day now and is requesting a prescription. Please call.

## 2013-02-09 NOTE — Telephone Encounter (Signed)
LMTCB

## 2013-02-11 ENCOUNTER — Other Ambulatory Visit: Payer: Self-pay | Admitting: *Deleted

## 2013-02-11 ENCOUNTER — Telehealth: Payer: Self-pay | Admitting: Family Medicine

## 2013-02-11 ENCOUNTER — Telehealth: Payer: Self-pay | Admitting: Oncology

## 2013-02-11 ENCOUNTER — Ambulatory Visit: Payer: Medicare Other

## 2013-02-11 ENCOUNTER — Ambulatory Visit (HOSPITAL_BASED_OUTPATIENT_CLINIC_OR_DEPARTMENT_OTHER): Payer: Medicare Other | Admitting: Oncology

## 2013-02-11 ENCOUNTER — Encounter: Payer: Self-pay | Admitting: Oncology

## 2013-02-11 ENCOUNTER — Other Ambulatory Visit (HOSPITAL_BASED_OUTPATIENT_CLINIC_OR_DEPARTMENT_OTHER): Payer: Medicare Other | Admitting: Lab

## 2013-02-11 ENCOUNTER — Ambulatory Visit: Payer: Medicaid Other | Admitting: Nutrition

## 2013-02-11 ENCOUNTER — Other Ambulatory Visit: Payer: Self-pay | Admitting: Family Medicine

## 2013-02-11 VITALS — BP 142/72 | HR 75 | Temp 97.8°F | Resp 18 | Ht 62.0 in | Wt 232.1 lb

## 2013-02-11 DIAGNOSIS — E785 Hyperlipidemia, unspecified: Secondary | ICD-10-CM

## 2013-02-11 DIAGNOSIS — C7951 Secondary malignant neoplasm of bone: Secondary | ICD-10-CM

## 2013-02-11 DIAGNOSIS — C50919 Malignant neoplasm of unspecified site of unspecified female breast: Secondary | ICD-10-CM

## 2013-02-11 DIAGNOSIS — D638 Anemia in other chronic diseases classified elsewhere: Secondary | ICD-10-CM

## 2013-02-11 DIAGNOSIS — C7952 Secondary malignant neoplasm of bone marrow: Secondary | ICD-10-CM

## 2013-02-11 HISTORY — DX: Hyperlipidemia, unspecified: E78.5

## 2013-02-11 LAB — COMPREHENSIVE METABOLIC PANEL (CC13)
ALT: 18 U/L (ref 0–55)
AST: 12 U/L (ref 5–34)
Albumin: 3.6 g/dL (ref 3.5–5.0)
Calcium: 10.2 mg/dL (ref 8.4–10.4)
Chloride: 101 mEq/L (ref 98–107)
Potassium: 3.8 mEq/L (ref 3.5–5.1)

## 2013-02-11 LAB — CBC WITH DIFFERENTIAL/PLATELET
BASO%: 0.5 % (ref 0.0–2.0)
EOS%: 2.3 % (ref 0.0–7.0)
MCH: 25.2 pg (ref 25.1–34.0)
MCHC: 33.8 g/dL (ref 31.5–36.0)
RBC: 3.73 10*6/uL (ref 3.70–5.45)
RDW: 19.9 % — ABNORMAL HIGH (ref 11.2–14.5)
lymph#: 1.3 10*3/uL (ref 0.9–3.3)

## 2013-02-11 MED ORDER — ATORVASTATIN CALCIUM 40 MG PO TABS: 40.0000 mg | ORAL_TABLET | Freq: Every day | ORAL | Status: DC

## 2013-02-11 NOTE — Progress Notes (Signed)
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  ER 100%/ PR 0%/Her2 neu negative.   PAST THERAPY:  Biopsy only   CURRENT THERAPY: started palliative letrozole in 11/2012.   INTERVAL HISTORY: Anita [name] 67 y.o. female returns for regular follow up by herself.  She reported tolerating letrozole well without hot flash/night sweat, bone/muscle pain. She has not noticed any more back pain compared to before therapy.  She still has DOE from past COPD.  She is no inhaler.  However, she needs portable O2 when she ambulates since she cannot ambulate very far on room air.  The rest of the 14 point review of system was negative.    Past Medical History  Diagnosis Date  . Fibroid   . Morbid obesity   . 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 COPD (chronic obstructive pulmonary disease)   . Diastolic heart failure   . NSTEMI (non-ST elevated myocardial infarction)   . Anemia   . Breast cancer   . Ulcers of both lower legs   . Tobacco abuse   . Hyperlipidemia 02/11/2013    Past Surgical History  Procedure Laterality Date  . Colonoscopy, esophagogastroduodenoscopy (egd) and esophageal dilation N/A 10/13/2012    Procedure: colonoscopy and egd;  Surgeon: Graylin Shiver, MD;  Location: [facility] ENDOSCOPY;  Service: Endoscopy;  Laterality: N/A;    Current Outpatient Prescriptions  Medication Sig Dispense Refill  . albuterol (PROVENTIL HFA;VENTOLIN HFA) 108 (90 BASE) MCG/ACT inhaler Inhale 1 puff into the lungs 2 (two) times daily as needed for wheezing or shortness of breath.      [name] aspirin EC 81 MG EC tablet Take 1 tablet (81 mg total) by mouth daily.  30 tablet  3  . Calcium Citrate (CALCITRATE PO) Take 1 tablet by mouth daily.       [name] letrozole (FEMARA) 2.5 MG tablet Take 1 tablet (2.5 mg total) by mouth daily.  30 tablet  3    . Potassium Chloride (KLOR-CON PO) Take by mouth daily.      [name] tiotropium (SPIRIVA HANDIHALER) 18 MCG inhalation capsule Place 1 capsule (18 mcg total) into inhaler and inhale daily.  30 capsule  6  . torsemide (DEMADEX) 20 MG tablet Take 2 tablets (40 mg total) by mouth daily.  60 tablet  3  . atorvastatin (LIPITOR) 40 MG tablet Take 1 tablet (40 mg total) by mouth daily at 6 PM.  30 tablet  5   No current facility-administered medications for this visit.    ALLERGIES:  is allergic to omnipaque and benzene.  REVIEW OF SYSTEMS:  The rest of the 14-point review of system was negative.   Filed Vitals:   02/11/13 1210  BP: 142/72  Pulse: 75  Temp: 97.8 F (36.6 C)  Resp: 18   Wt Readings from Last 3 Encounters:  02/11/13 232 lb 1.6 oz (105.28 kg)  01/21/13 229 lb (103.874 kg)  12/11/12 227 lb (102.967 kg)   ECOG Performance status: 1 due to obesity, decondition, bilateral knee pain.   PHYSICAL EXAMINATION:   General:  Obese woman, in no acute distress.  Eyes:  no scleral icterus.  ENT:  There were no oropharyngeal lesions.  Neck was without thyromegaly.  Lymphatics:  Negative cervical, supraclavicular or axillary adenopathy.  Respiratory: lungs were clear  bilaterally without wheezing or crackles.  Cardiovascular:  Regular rate and rhythm, S1/S2, without murmur, rub or gallop.  There was no pedal edema.  GI:  abdomen was soft, flat, nontender, obese, without organomegaly. There was no pain to palpation of LUQ area.  Muscoloskeletal:  no spinal tenderness of palpation of vertebral spine.  Skin exam was without echymosis, petichae.  Neuro exam was nonfocal. Patient was alert and oriented.  Attention was good.   Language was appropriate.  Mood was normal without depression.  Speech was not pressured.  Thought content was not tangential.  Right breast exam showed a right lower outer quadrant breast mass about 5cm.  The breast mass was again about 5cm but it was much softer with less discreet  border compared to before.     LABORATORY/RADIOLOGY DATA:  Lab Results  Component Value Date   WBC 7.3 02/11/2013   HGB 9.4* 02/11/2013   HCT 27.8* 02/11/2013   PLT 335 02/11/2013   GLUCOSE 143* 02/11/2013   ALKPHOS 133 02/11/2013   ALT 18 02/11/2013   AST 12 02/11/2013   NA 138 02/11/2013   K 3.8 02/11/2013   CL 101 02/11/2013   CREATININE 1.4* 02/11/2013   BUN 29.3* 02/11/2013   CO2 24 02/11/2013   INR 0.97 10/10/2012   HGBA1C 6.2 12/11/2012    ASSESSMENT AND PLAN:    1. Grade 2 dystolic dysfunction: she is euvolumic today on exam.  She is on Torsemide.  2. Diabetes mellitus, type II. Diet controlled.  3. COPD:  On albuterol prn.   She requested home O2.  I filled this request.  4. Chronic renal insufficiency.  5. Metastatic breast cancer (ER pos/PR pos/ HER2 neg) with spread to bone.  -Clinically, she is having response with smaller right breast mass and less bone pain. She is tolerating letrozole well without bone/muscle pain, hot flash/night sweat. She is taking Calcium to decrease risk of osteoporosis.  - Despite my explanation to her about her benefit of either Zometa or Xgeva and low risk of just temporary myalgia/arthralgia, she did not want to try either.    6.  Anemia of chronic disease:  Past work up for reversible causes of anemia were negative.   7.   Follow up:  In about 3 months with restaged CT and mammogram before visit.     I informed Ms. Nowland that I am leaving the practice.  [facility] will arrange for her to see another provider when she returns.     The length of time of the face-to-face encounter was 25 minutes. More than 50% of time was spent counseling and coordination of care.

## 2013-02-11 NOTE — Progress Notes (Signed)
This patient is a 67 year old female diagnosed with metastatic breast cancer.  Past medical history includes morbid obesity, COPD, anemia, lower leg ulcers, tobacco usage, COPD, and diabetes.  Medications include Lipitor, calcium, and Femara.  Labs include a hemoglobin A1c of 6.2.  Height: 62 inches. Weight: 232.1 pounds. BMI: 42.44.  Patient reports she is not going to be treated with Zometa. She reports that she has been told she is a borderline diabetic. She states she does not want to learn how to count carbohydrates. She is willing to modify her diet somewhat, but does not know what to do.  Nutrition diagnosis: Food and nutrition related knowledge deficit related to diagnosis of metastatic breast cancer and diabetes as evidenced by no prior need for nutrition related information.  Intervention: I briefly educated patient on the importance of reducing simple carbohydrates in her diet. I have discouraged regular soft drinks, sweet tea, syrups and jellies, candies, and frequent desserts. I have encouraged patient to consume complex carbohydrates in the form of vegetables, whole fruits, and whole grains. I've encouraged her to consume balanced meals. Patient able to teach back how to put together a healthy meal. Teach back method used. I have referred patient back to her physician for a [facility] to [facility] for further diabetic teaching.  Monitoring, evaluation, goals: Patient will tolerate a healthy plant-based diet that is low in simple carbohydrates and concentrated sweets for improved glycemic control.  Next visit: Patient has my contact information for questions but will followup with RD and RN at [facility] for diabetic teaching.

## 2013-02-11 NOTE — Telephone Encounter (Signed)
Will FWD to MD for med approval.  Radene Ou, CMA

## 2013-02-11 NOTE — Progress Notes (Signed)
Home oxygen ordered via nasal cannula with room air stats @ rest, per [facility].

## 2013-02-11 NOTE — Telephone Encounter (Signed)
Patient calls stating that she contacted pharmacy for refill of Lipitor. Pharmacy claims they faxed a request 3x with no response. Patient is completely out of meds now and needs refill.

## 2013-02-11 NOTE — Progress Notes (Signed)
Spoke with pt in the lobby.  Confirmed with pt that pt did not wish to receive Zometa today.  Pt requested all scheduled appts for Zometa to be cancelled. Message sent to Dr. Gaylyn Rong for information.

## 2013-02-11 NOTE — Telephone Encounter (Signed)
Gaver pt appt for lab and Md for September 2014, pt will have mammogram on September and Ct also

## 2013-02-11 NOTE — Telephone Encounter (Signed)
Medication refilled x 6 months.

## 2013-02-11 NOTE — Telephone Encounter (Signed)
Patient is calling wanting to discuss the results of her blood work with the nurse.  She has some questions.

## 2013-02-12 ENCOUNTER — Ambulatory Visit: Payer: Self-pay

## 2013-02-12 NOTE — Progress Notes (Signed)
Patient has [facility], so [facility] to deliver oxygen when approved; [place] @ [facility] to fax over oxygen orders.

## 2013-02-19 NOTE — Telephone Encounter (Signed)
Please advise. Anita [name]  

## 2013-02-19 NOTE — Telephone Encounter (Signed)
Dr. Gaylyn Rong ordered the blood work. She she defer to him.

## 2013-02-19 NOTE — Telephone Encounter (Signed)
Has this been addressed yet?

## 2013-03-12 ENCOUNTER — Ambulatory Visit: Payer: Self-pay

## 2013-03-13 ENCOUNTER — Encounter (HOSPITAL_COMMUNITY): Payer: Self-pay | Admitting: *Deleted

## 2013-03-13 ENCOUNTER — Inpatient Hospital Stay (HOSPITAL_COMMUNITY)
Admission: EM | Admit: 2013-03-13 | Discharge: 2013-03-17 | DRG: 280 | Disposition: A | Discharge status: Home / Self Care | Payer: Medicare HMO | Attending: Family Medicine | Admitting: Family Medicine

## 2013-03-13 ENCOUNTER — Emergency Department (HOSPITAL_COMMUNITY): Payer: Medicare HMO

## 2013-03-13 DIAGNOSIS — R0602 Shortness of breath: Secondary | ICD-10-CM

## 2013-03-13 DIAGNOSIS — I2510 Atherosclerotic heart disease of native coronary artery without angina pectoris: Secondary | ICD-10-CM | POA: Diagnosis present

## 2013-03-13 DIAGNOSIS — Z806 Family history of leukemia: Secondary | ICD-10-CM

## 2013-03-13 DIAGNOSIS — C7951 Secondary malignant neoplasm of bone: Secondary | ICD-10-CM | POA: Diagnosis present

## 2013-03-13 DIAGNOSIS — J4489 Other specified chronic obstructive pulmonary disease: Secondary | ICD-10-CM | POA: Diagnosis present

## 2013-03-13 DIAGNOSIS — J9610 Chronic respiratory failure, unspecified whether with hypoxia or hypercapnia: Secondary | ICD-10-CM | POA: Diagnosis present

## 2013-03-13 DIAGNOSIS — C50919 Malignant neoplasm of unspecified site of unspecified female breast: Secondary | ICD-10-CM

## 2013-03-13 DIAGNOSIS — Z8249 Family history of ischemic heart disease and other diseases of the circulatory system: Secondary | ICD-10-CM

## 2013-03-13 DIAGNOSIS — Z6841 Body Mass Index (BMI) 40.0 and over, adult: Secondary | ICD-10-CM

## 2013-03-13 DIAGNOSIS — I5032 Chronic diastolic (congestive) heart failure: Secondary | ICD-10-CM | POA: Diagnosis present

## 2013-03-13 DIAGNOSIS — I214 Non-ST elevation (NSTEMI) myocardial infarction: Secondary | ICD-10-CM

## 2013-03-13 DIAGNOSIS — I509 Heart failure, unspecified: Secondary | ICD-10-CM | POA: Diagnosis present

## 2013-03-13 DIAGNOSIS — C7800 Secondary malignant neoplasm of unspecified lung: Secondary | ICD-10-CM | POA: Diagnosis present

## 2013-03-13 DIAGNOSIS — D638 Anemia in other chronic diseases classified elsewhere: Secondary | ICD-10-CM | POA: Diagnosis present

## 2013-03-13 DIAGNOSIS — C7952 Secondary malignant neoplasm of bone marrow: Secondary | ICD-10-CM | POA: Diagnosis present

## 2013-03-13 DIAGNOSIS — Z87891 Personal history of nicotine dependence: Secondary | ICD-10-CM

## 2013-03-13 DIAGNOSIS — N183 Chronic kidney disease, stage 3 unspecified: Secondary | ICD-10-CM | POA: Diagnosis present

## 2013-03-13 DIAGNOSIS — Z853 Personal history of malignant neoplasm of breast: Secondary | ICD-10-CM

## 2013-03-13 DIAGNOSIS — R079 Chest pain, unspecified: Secondary | ICD-10-CM

## 2013-03-13 DIAGNOSIS — E785 Hyperlipidemia, unspecified: Secondary | ICD-10-CM | POA: Diagnosis present

## 2013-03-13 DIAGNOSIS — E119 Type 2 diabetes mellitus without complications: Secondary | ICD-10-CM | POA: Diagnosis present

## 2013-03-13 DIAGNOSIS — I5033 Acute on chronic diastolic (congestive) heart failure: Secondary | ICD-10-CM

## 2013-03-13 DIAGNOSIS — J449 Chronic obstructive pulmonary disease, unspecified: Secondary | ICD-10-CM | POA: Diagnosis present

## 2013-03-13 DIAGNOSIS — I129 Hypertensive chronic kidney disease with stage 1 through stage 4 chronic kidney disease, or unspecified chronic kidney disease: Secondary | ICD-10-CM | POA: Diagnosis present

## 2013-03-13 DIAGNOSIS — Z833 Family history of diabetes mellitus: Secondary | ICD-10-CM

## 2013-03-13 LAB — BASIC METABOLIC PANEL
CO2: 23 mEq/L (ref 19–32)
Chloride: 105 mEq/L (ref 96–112)
Glucose, Bld: 251 mg/dL — ABNORMAL HIGH (ref 70–99)
Sodium: 141 mEq/L (ref 135–145)

## 2013-03-13 LAB — POCT I-STAT TROPONIN I

## 2013-03-13 LAB — CBC
HCT: 31.1 % — ABNORMAL LOW (ref 36.0–46.0)
Hemoglobin: 10 g/dL — ABNORMAL LOW (ref 12.0–15.0)
MCH: 24 pg — ABNORMAL LOW (ref 26.0–34.0)
MCHC: 32.2 g/dL (ref 30.0–36.0)
MCV: 74.8 fL — ABNORMAL LOW (ref 78.0–100.0)
RDW: 19.1 % — ABNORMAL HIGH (ref 11.5–15.5)

## 2013-03-13 LAB — URINALYSIS, ROUTINE W REFLEX MICROSCOPIC
Nitrite: POSITIVE — AB
Protein, ur: NEGATIVE mg/dL
Urobilinogen, UA: 0.2 mg/dL (ref 0.0–1.0)

## 2013-03-13 LAB — PROTIME-INR
INR: 0.96 (ref 0.00–1.49)
Prothrombin Time: 12.6 seconds (ref 11.6–15.2)

## 2013-03-13 LAB — URINE MICROSCOPIC-ADD ON

## 2013-03-13 LAB — TROPONIN I: Troponin I: 2.19 ng/mL (ref <0.30)

## 2013-03-13 LAB — APTT: aPTT: 27 seconds (ref 24–37)

## 2013-03-13 MED ORDER — SODIUM CHLORIDE 0.9 % IV SOLN: 20.0000 mL | INTRAVENOUS | Status: DC

## 2013-03-13 MED ORDER — HEPARIN (PORCINE) IN NACL 100-0.45 UNIT/ML-% IJ SOLN
1050.0000 [IU]/h | INTRAMUSCULAR | Status: DC
  Administered 2013-03-13: 1050 [IU]/h via INTRAVENOUS
  Filled 2013-03-13 (×2): qty 250

## 2013-03-13 MED ORDER — IOHEXOL 350 MG/ML SOLN
80.0000 mL | Freq: Once | INTRAVENOUS | Status: AC | PRN
  Administered 2013-03-13: 80 mL via INTRAVENOUS

## 2013-03-13 MED ORDER — FUROSEMIDE 10 MG/ML IJ SOLN
40.0000 mg | Freq: Once | INTRAMUSCULAR | Status: AC
  Administered 2013-03-13: 40 mg via INTRAVENOUS
  Filled 2013-03-13: qty 4

## 2013-03-13 MED ORDER — METHYLPREDNISOLONE SODIUM SUCC 125 MG IJ SOLR
125.0000 mg | Freq: Once | INTRAMUSCULAR | Status: AC
  Administered 2013-03-13: 125 mg via INTRAVENOUS
  Filled 2013-03-13: qty 2

## 2013-03-13 MED ORDER — ASPIRIN 81 MG PO CHEW
324.0000 mg | CHEWABLE_TABLET | Freq: Once | ORAL | Status: AC
  Administered 2013-03-13: 243 mg via ORAL
  Filled 2013-03-13: qty 4

## 2013-03-13 MED ORDER — DIPHENHYDRAMINE HCL 50 MG/ML IJ SOLN
50.0000 mg | Freq: Once | INTRAMUSCULAR | Status: AC
  Administered 2013-03-13: 50 mg via INTRAVENOUS
  Filled 2013-03-13: qty 1

## 2013-03-13 MED ORDER — HEPARIN BOLUS VIA INFUSION
4000.0000 [IU] | Freq: Once | INTRAVENOUS | Status: AC
  Administered 2013-03-13: 4000 [IU] via INTRAVENOUS

## 2013-03-13 MED ORDER — NITROGLYCERIN 0.4 MG SL SUBL
0.4000 mg | SUBLINGUAL_TABLET | SUBLINGUAL | Status: DC | PRN
  Filled 2013-03-13: qty 25

## 2013-03-13 NOTE — ED Notes (Signed)
Ray MD at bedside 

## 2013-03-13 NOTE — ED Notes (Signed)
Blake at bedside to complete CXR

## 2013-03-13 NOTE — ED Notes (Signed)
Patient transported to CT 

## 2013-03-13 NOTE — ED Provider Notes (Signed)
Roylene Heaton S 8:00PM patient discussed in sign out. Patient with elevated troponin level and cardiology was consult it. They should be to see patient and give recommendations.  9:00 PM I was called by radiology department. Patient has a CT angios chest ordered however has allergy to contrast. I spoke with on-call cardiology resident. He has not been to see the patient but states that he's discussed with Dr. Rosalia Hammers the knee to rule out a PE in this patient with history of cancer shortness of breath and lower leg swelling. He has not had any specific recommendations on how to rule this out.  I spoke with radiology again they can do protocol Solu-Medrol and Benadryl one hour prior to the CT scan and still do a CT angiogram Without the need to call in a team for VQ scan. At this time we will proceed with this plan. Patient is maintaining good oxygen saturation. Heart rate has improved while resting. She is occasionally tachypnea but otherwise stable and comfortable. Will continue to monitor.  12:00AM CT scans concerning for further metastasis of breast cancer within the lungs and bones. Other possibility may include acute infectious process however this is less likely considering history. No signs of PE. I spoke with on-call cardiologist again and he'll see patient and let me know what he thinks.  Cardiology has seen the patient we will continue to follow and make recommendations but are requesting hospitalist admission. We'll plan to contact internal medicine residents.  Spoke with family practice residents. They will see patient and admit.  Angus Seller, PA-C 03/14/13 [phone]

## 2013-03-13 NOTE — Progress Notes (Signed)
ANTICOAGULATION CONSULT NOTE - Initial Consult  Pharmacy Consult for Heparin Indication: chest pain/ACS  Allergies  Allergen Reactions  . Omnipaque (Iohexol)     Pt claims she developed hives after given contrast  . Benzene Rash    Patient Measurements:    Stated:  Ht: 62 in  Wt: ~230 lb (105 kg)  IBW: 50 kg Heparin Dosing Weight (based on stated wt): 75 kg  Vital Signs: Temp: 98.3 F (36.8 C) (07/18 1559) Temp src: Oral (07/18 1559) BP: 122/72 mmHg (07/18 2005) Pulse Rate: 92 (07/18 1930)  Labs:  Recent Labs  03/13/13 1602 03/13/13 1831  HGB 10.0*  --   HCT 31.1*  --   PLT 313  --   APTT  --  27  LABPROT  --  12.6  INR  --  0.96  CREATININE 1.06  --   TROPONINI  --  2.19*    The CrCl is unknown because both a height and weight (above a minimum accepted value) are required for this calculation.   Medical History: Past Medical History  Diagnosis Date  . Fibroid   . Morbid obesity   . 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 COPD (chronic obstructive pulmonary disease)   . Diastolic heart failure   . NSTEMI (non-ST elevated myocardial infarction)   . Anemia   . Breast cancer   . Ulcers of both lower legs   . Tobacco abuse   . Hyperlipidemia 02/11/2013    Medications:  See electronic med rec  Assessment: 67 y.o. female presents with SOB, chest pain. Trop 2.19. To begin heparin gtt for r/o ACS. Baseline Hgb 10 - will watch.  Goal of Therapy:  Heparin level 0.3-0.7 units/ml Monitor platelets by anticoagulation protocol: Yes   Plan:  1. Heparin bolus 4000 units IV 2. Heparin gtt at 1050 units/hr 3. Will f/u 6 hr heparin level 4. Daily heparin level and CBC  Christoper Fabian, PharmD, BCPS Clinical pharmacist, pager [phone] 03/13/2013,8:21 PM

## 2013-03-13 NOTE — ED Notes (Signed)
Family at bedside. 

## 2013-03-13 NOTE — ED Provider Notes (Signed)
History    CSN: [id number] Arrival date & time 03/13/13  1543  First MD Initiated Contact with Patient 03/13/13 1639     Chief Complaint  Patient presents with  . Shortness of Breath   (Consider location/radiation/quality/duration/timing/severity/associated sxs/prior Treatment) HPI  67 y.o. Female with history of copd, nstemi complains of sscp and dyspnea began today about 1300.  Patient became acutely dyspneic at lunch and was burping.  Baseline has sob now worse.  Patient states she began having sscp described as heartburn which began about 1400 and lasted 1 hour 45 minutes but went away after drinking water.  Pain radiated to bilateral shoulders.  Denies history of nstemi although listed in pmh.  PMD Dr. Leveda Anna, pulm snood, cardioloy- [place].  All pain and chest dicomfort currently resolved.  Patient continues to feel dyspneic.  States she has swelling in left leg-denies history of dvt or pe.   Past Medical History  Diagnosis Date  . Fibroid   . Morbid obesity   . 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 COPD (chronic obstructive pulmonary disease)   . Diastolic heart failure   . NSTEMI (non-ST elevated myocardial infarction)   . Anemia   . Breast cancer   . Ulcers of both lower legs   . Tobacco abuse   . Hyperlipidemia 02/11/2013   Past Surgical History  Procedure Laterality Date  . Colonoscopy, esophagogastroduodenoscopy (egd) and esophageal dilation N/A 10/13/2012    Procedure: colonoscopy and egd;  Surgeon: Graylin Shiver, MD;  Location: [facility] ENDOSCOPY;  Service: Endoscopy;  Laterality: N/A;   Family History  Problem Relation Age of Onset  . Cancer Mother     leukemia  . Hypertension Mother   . Diabetes Father   . Heart disease Father     passed away due to heart attack   History  Substance Use Topics  . Smoking status: Former Smoker -- 2.00 packs/day for 40 years    Types: Cigarettes    Quit date: 09/24/2012  .  Smokeless tobacco: Never Used  . Alcohol Use: No   OB History   Grav Para Term Preterm Abortions TAB SAB Ect Mult Living   2 2        2      Review of Systems  All other systems reviewed and are negative.    Allergies  Omnipaque and Benzene  Home Medications   Current Outpatient Rx  Name  Route  Sig  Dispense  Refill  . albuterol (PROVENTIL HFA;VENTOLIN HFA) 108 (90 BASE) MCG/ACT inhaler   Inhalation   Inhale 1 puff into the lungs 2 (two) times daily as needed for wheezing or shortness of breath.         [name] aspirin EC 81 MG EC tablet   Oral   Take 1 tablet (81 mg total) by mouth daily.   30 tablet   3   . atorvastatin (LIPITOR) 40 MG tablet   Oral   Take 1 tablet (40 mg total) by mouth daily at 6 PM.   30 tablet   5   . Calcium Citrate (CALCITRATE PO)   Oral   Take 1 tablet by mouth daily.          [name] letrozole (FEMARA) 2.5 MG tablet   Oral   Take 1 tablet (2.5 mg total) by mouth daily.   30 tablet   3   . Potassium Chloride (KLOR-CON PO)  Oral   Take by mouth daily.         [name] tiotropium (SPIRIVA HANDIHALER) 18 MCG inhalation capsule   Inhalation   Place 1 capsule (18 mcg total) into inhaler and inhale daily.   30 capsule   6    BP 169/67  Pulse 119  Temp(Src) 98.3 F (36.8 C) (Oral)  Resp 22  SpO2 94% Physical Exam  Nursing note and vitals reviewed. Constitutional: She appears well-developed and well-nourished.  HENT:  Head: Normocephalic and atraumatic.  Right Ear: External ear normal.  Left Ear: External ear normal.  Nose: Nose normal.  Mouth/Throat: Oropharynx is clear and moist.  Eyes: Conjunctivae and EOM are normal. Pupils are equal, round, and reactive to light.  Neck: Normal range of motion. Neck supple.  Cardiovascular: Normal heart sounds and intact distal pulses.  Tachycardia present.   Pulses:      Carotid pulses are 1+ on the right side, and 1+ on the left side.      Radial pulses are 1+ on the right side, and 1+ on the  left side.       Femoral pulses are 1+ on the right side, and 1+ on the left side.      Popliteal pulses are 1+ on the right side, and 1+ on the left side.       Dorsalis pedis pulses are 1+ on the right side, and 1+ on the left side.       Posterior tibial pulses are 1+ on the right side, and 1+ on the left side.  Pulmonary/Chest: Effort normal and breath sounds normal.  Abdominal: Soft. Bowel sounds are normal.  Musculoskeletal: Normal range of motion. She exhibits edema.  Bilateral ankle edema, lle with edema 2/3 up anterior tibia  Skin: Skin is warm and dry.  Psychiatric: She has a normal mood and affect. Her behavior is normal. Judgment and thought content normal.    ED Course  Procedures (including critical care time) Labs Reviewed  CBC - Abnormal; Notable for the following:    Hemoglobin 10.0 (*)    HCT 31.1 (*)    MCV 74.8 (*)    MCH 24.0 (*)    RDW 19.1 (*)    All other components within normal limits  BASIC METABOLIC PANEL  POCT I-STAT TROPONIN I   No results found. No diagnosis found. Vent. rate 118 BPM PR interval 152 ms QRS duration 88 ms QT/QTc 346/484 ms P-R-T axes 78 94 67   Date: 03/13/2013  Rate: 118  Rhythm: sinus tachycardia  QRS Axis: normal  Intervals: normal  ST/T Wave abnormalities: nonspecific ST changes  Conduction Disutrbances:none  Narrative Interpretation:   Old EKG Reviewed: changes noted Rate has increased from 72 on 10/17/12 Results for orders placed during the hospital encounter of 03/13/13  BASIC METABOLIC PANEL      Result Value Range   Sodium 141  135 - 145 mEq/L   Potassium 3.6  3.5 - 5.1 mEq/L   Chloride 105  96 - 112 mEq/L   CO2 23  19 - 32 mEq/L   Glucose, Bld 251 (*) 70 - 99 mg/dL   BUN 32 (*) 6 - 23 mg/dL   Creatinine, Ser 6.96  0.50 - 1.10 mg/dL   Calcium 9.8  8.4 - 29.5 mg/dL   GFR calc non Af Amer 54 (*) >90 mL/min   GFR calc Af Amer 62 (*) >90 mL/min  CBC      Result Value Range  WBC 7.7  4.0 - 10.5 K/uL   RBC  4.16  3.87 - 5.11 MIL/uL   Hemoglobin 10.0 (*) 12.0 - 15.0 g/dL   HCT 16.1 (*) 09.6 - 04.5 %   MCV 74.8 (*) 78.0 - 100.0 fL   MCH 24.0 (*) 26.0 - 34.0 pg   MCHC 32.2  30.0 - 36.0 g/dL   RDW 40.9 (*) 81.1 - 91.4 %   Platelets 313  150 - 400 K/uL  D-DIMER, QUANTITATIVE      Result Value Range   D-Dimer, Quant 0.69 (*) 0.00 - 0.48 ug/mL-FEU  PROTIME-INR      Result Value Range   Prothrombin Time 12.6  11.6 - 15.2 seconds   INR 0.96  0.00 - 1.49  APTT      Result Value Range   aPTT 27  24 - 37 seconds  TROPONIN I      Result Value Range   Troponin I 2.19 (*) <0.30 ng/mL  POCT I-STAT TROPONIN I      Result Value Range   Troponin i, poc 0.08  0.00 - 0.08 ng/mL   Comment NOTIFIED PHYSICIAN     Comment 3             MDM    Cardiology paged and patient's care discussed with Dr. Elease Hashimoto.  He requested a repeat troponin. Repeat troponin obtained and is 2.19.  Patient has remained pain free here in emergency department. Her EKG does not show acute ST elevation. Cardiology is to be paged.    Patient with hyperglycemia- refused iv fluid bolus.   Discussed with Dr. Ladona Ridgel and he will be in to evaluate. 7:54 PM CRITICAL CARE Performed by: Hilario Quarry Total critical care time: 45 Critical care time was exclusive of separately billable procedures and treating other patients. Critical care was necessary to treat or prevent imminent or life-threatening deterioration. Critical care was time spent personally by me on the following activities: development of treatment plan with patient and/or surrogate as well as nursing, discussions with consultants, evaluation of patient's response to treatment, examination of patient, obtaining history from patient or surrogate, ordering and performing treatments and interventions, ordering and review of laboratory studies, ordering and review of radiographic studies, pulse oximetry and re-evaluation of patient's condition.   Hilario Quarry,  MD 03/13/13 [phone]

## 2013-03-13 NOTE — ED Notes (Signed)
Pt c/o SOB, mid-sternum chest pain, and nausea starting today approx 1330 after exercising and eating lunch. Pt uses 2L O2 at home, pt reports she quit smoking cigarettes in January. Pt denies back pain, or diaphoresis.  Pt also c/o swelling to LLE x1 week

## 2013-03-13 NOTE — ED Notes (Signed)
Pt reports sob x 2 hours, hx of copd. spo2 93% on room air. HR 128.

## 2013-03-14 DIAGNOSIS — R609 Edema, unspecified: Secondary | ICD-10-CM

## 2013-03-14 DIAGNOSIS — I5033 Acute on chronic diastolic (congestive) heart failure: Secondary | ICD-10-CM

## 2013-03-14 DIAGNOSIS — I214 Non-ST elevation (NSTEMI) myocardial infarction: Secondary | ICD-10-CM

## 2013-03-14 DIAGNOSIS — I509 Heart failure, unspecified: Secondary | ICD-10-CM

## 2013-03-14 DIAGNOSIS — R7989 Other specified abnormal findings of blood chemistry: Secondary | ICD-10-CM

## 2013-03-14 LAB — BASIC METABOLIC PANEL
BUN: 35 mg/dL — ABNORMAL HIGH (ref 6–23)
Calcium: 9.7 mg/dL (ref 8.4–10.5)
Chloride: 101 mEq/L (ref 96–112)
Creatinine, Ser: 1.06 mg/dL (ref 0.50–1.10)
GFR calc Af Amer: 62 mL/min — ABNORMAL LOW (ref >90)
GFR calc Af Amer: 62 mL/min — ABNORMAL LOW (ref >90)
GFR calc non Af Amer: 53 mL/min — ABNORMAL LOW (ref >90)
Sodium: 137 mEq/L (ref 135–145)

## 2013-03-14 LAB — CBC
HCT: 30.7 % — ABNORMAL LOW (ref 36.0–46.0)
Hemoglobin: 10 g/dL — ABNORMAL LOW (ref 12.0–15.0)
MCH: 24.2 pg — ABNORMAL LOW (ref 26.0–34.0)
MCHC: 32.6 g/dL (ref 30.0–36.0)
MCV: 74.2 fL — ABNORMAL LOW (ref 78.0–100.0)

## 2013-03-14 LAB — HEPARIN LEVEL (UNFRACTIONATED)
Heparin Unfractionated: 0.24 IU/mL — ABNORMAL LOW (ref 0.30–0.70)
Heparin Unfractionated: 0.23 IU/mL — ABNORMAL LOW (ref 0.30–0.70)

## 2013-03-14 LAB — TROPONIN I
Troponin I: 2.92 ng/mL (ref <0.30)
Troponin I: 1.33 ng/mL (ref <0.30)

## 2013-03-14 LAB — GLUCOSE, CAPILLARY
Glucose-Capillary: 249 mg/dL — ABNORMAL HIGH (ref 70–99)
Glucose-Capillary: 262 mg/dL — ABNORMAL HIGH (ref 70–99)
Glucose-Capillary: 195 mg/dL — ABNORMAL HIGH (ref 70–99)

## 2013-03-14 LAB — HEMOGLOBIN A1C: Mean Plasma Glucose: 137 mg/dL — ABNORMAL HIGH (ref <117)

## 2013-03-14 LAB — LIPID PANEL
LDL Cholesterol: 96 mg/dL (ref 0–99)
VLDL: 20 mg/dL (ref 0–40)

## 2013-03-14 LAB — PRO B NATRIURETIC PEPTIDE: Pro B Natriuretic peptide (BNP): 2987 pg/mL — ABNORMAL HIGH (ref 0–125)

## 2013-03-14 MED ORDER — HEPARIN (PORCINE) IN NACL 100-0.45 UNIT/ML-% IJ SOLN
1300.0000 [IU]/h | INTRAMUSCULAR | Status: DC
  Administered 2013-03-14: 1300 [IU]/h via INTRAVENOUS
  Filled 2013-03-14 (×2): qty 250

## 2013-03-14 MED ORDER — ONDANSETRON HCL 4 MG PO TABS: 4.0000 mg | ORAL_TABLET | Freq: Four times a day (QID) | ORAL | Status: DC | PRN

## 2013-03-14 MED ORDER — CALCIUM CITRATE 950 (200 CA) MG PO TABS
200.0000 mg | ORAL_TABLET | Freq: Three times a day (TID) | ORAL | Status: DC
  Administered 2013-03-14 – 2013-03-17 (×11): 200 mg via ORAL
  Filled 2013-03-14 (×13): qty 1

## 2013-03-14 MED ORDER — POTASSIUM CHLORIDE CRYS ER 20 MEQ PO TBCR
40.0000 meq | EXTENDED_RELEASE_TABLET | Freq: Once | ORAL | Status: AC
  Administered 2013-03-14: 40 meq via ORAL
  Filled 2013-03-14: qty 2

## 2013-03-14 MED ORDER — ACETAMINOPHEN 650 MG RE SUPP: 650.0000 mg | Freq: Four times a day (QID) | RECTAL | Status: DC | PRN

## 2013-03-14 MED ORDER — ONDANSETRON HCL 4 MG/2ML IJ SOLN: 4.0000 mg | Freq: Four times a day (QID) | INTRAMUSCULAR | Status: DC | PRN

## 2013-03-14 MED ORDER — POTASSIUM CHLORIDE CRYS ER 20 MEQ PO TBCR
40.0000 meq | EXTENDED_RELEASE_TABLET | Freq: Once | ORAL | Status: DC
Start: 2013-03-14 — End: 2013-03-17

## 2013-03-14 MED ORDER — POTASSIUM CHLORIDE 20 MEQ PO PACK
20.0000 meq | PACK | Freq: Every day | ORAL | Status: DC
  Filled 2013-03-14: qty 1

## 2013-03-14 MED ORDER — DEXTROSE 5 % IV SOLN
2.0000 g | Freq: Once | INTRAVENOUS | Status: AC
  Administered 2013-03-14: 2 g via INTRAVENOUS
  Filled 2013-03-14: qty 2

## 2013-03-14 MED ORDER — HEPARIN BOLUS VIA INFUSION
2000.0000 [IU] | Freq: Once | INTRAVENOUS | Status: AC
  Administered 2013-03-14: 2000 [IU] via INTRAVENOUS
  Filled 2013-03-14: qty 2000

## 2013-03-14 MED ORDER — HEPARIN (PORCINE) IN NACL 100-0.45 UNIT/ML-% IJ SOLN
1550.0000 [IU]/h | INTRAMUSCULAR | Status: DC
  Administered 2013-03-15: 1550 [IU]/h via INTRAVENOUS
  Administered 2013-03-15: 1750 [IU]/h via INTRAVENOUS
  Filled 2013-03-14 (×6): qty 250

## 2013-03-14 MED ORDER — ATORVASTATIN CALCIUM 40 MG PO TABS
40.0000 mg | ORAL_TABLET | Freq: Every day | ORAL | Status: DC
  Administered 2013-03-14 – 2013-03-16 (×3): 40 mg via ORAL
  Filled 2013-03-14 (×4): qty 1

## 2013-03-14 MED ORDER — INSULIN ASPART 100 UNIT/ML ~~LOC~~ SOLN: 0.0000 [IU] | Freq: Every day | SUBCUTANEOUS | Status: DC

## 2013-03-14 MED ORDER — LETROZOLE 2.5 MG PO TABS
2.5000 mg | ORAL_TABLET | Freq: Every day | ORAL | Status: DC
  Administered 2013-03-14 – 2013-03-17 (×4): 2.5 mg via ORAL
  Filled 2013-03-14 (×4): qty 1

## 2013-03-14 MED ORDER — ACETAMINOPHEN 325 MG PO TABS: 650.0000 mg | ORAL_TABLET | Freq: Four times a day (QID) | ORAL | Status: DC | PRN

## 2013-03-14 MED ORDER — BIOTENE DRY MOUTH MT LIQD
15.0000 mL | Freq: Two times a day (BID) | OROMUCOSAL | Status: DC
  Administered 2013-03-16 – 2013-03-17 (×3): 15 mL via OROMUCOSAL

## 2013-03-14 MED ORDER — FUROSEMIDE 10 MG/ML IJ SOLN: 40.0000 mg | Freq: Every day | INTRAMUSCULAR | Status: DC

## 2013-03-14 MED ORDER — INSULIN ASPART 100 UNIT/ML ~~LOC~~ SOLN
0.0000 [IU] | Freq: Three times a day (TID) | SUBCUTANEOUS | Status: DC
  Administered 2013-03-14: 5 [IU] via SUBCUTANEOUS
  Administered 2013-03-15: 2 [IU] via SUBCUTANEOUS
  Administered 2013-03-15: 1 [IU] via SUBCUTANEOUS
  Administered 2013-03-15: 2 [IU] via SUBCUTANEOUS
  Administered 2013-03-16: 1 [IU] via SUBCUTANEOUS
  Administered 2013-03-16: 3 [IU] via SUBCUTANEOUS
  Administered 2013-03-16 – 2013-03-17 (×2): 1 [IU] via SUBCUTANEOUS

## 2013-03-14 MED ORDER — TIOTROPIUM BROMIDE MONOHYDRATE 18 MCG IN CAPS
18.0000 ug | ORAL_CAPSULE | Freq: Every day | RESPIRATORY_TRACT | Status: DC
  Administered 2013-03-14 – 2013-03-17 (×4): 18 ug via RESPIRATORY_TRACT
  Filled 2013-03-14 (×3): qty 5

## 2013-03-14 MED ORDER — ASPIRIN EC 81 MG PO TBEC
81.0000 mg | DELAYED_RELEASE_TABLET | Freq: Every day | ORAL | Status: DC
  Administered 2013-03-14 – 2013-03-17 (×4): 81 mg via ORAL
  Filled 2013-03-14 (×4): qty 1

## 2013-03-14 MED ORDER — FUROSEMIDE 10 MG/ML IJ SOLN
40.0000 mg | Freq: Two times a day (BID) | INTRAMUSCULAR | Status: DC
  Administered 2013-03-14 – 2013-03-17 (×7): 40 mg via INTRAVENOUS
  Filled 2013-03-14 (×9): qty 4

## 2013-03-14 MED ORDER — POTASSIUM CHLORIDE CRYS ER 20 MEQ PO TBCR
20.0000 meq | EXTENDED_RELEASE_TABLET | Freq: Every day | ORAL | Status: DC
  Administered 2013-03-14 – 2013-03-17 (×4): 20 meq via ORAL
  Filled 2013-03-14 (×4): qty 1

## 2013-03-14 NOTE — ED Notes (Signed)
Pt requested to sit on the side of the bed and some water to drink. RN is aware.

## 2013-03-14 NOTE — Progress Notes (Signed)
ANTICOAGULATION CONSULT NOTE - Follow Up Consult  Pharmacy Consult for heparin Indication: NSTEMI  Labs:  Recent Labs  03/13/13 1602  03/13/13 1831 03/14/13 0500 03/14/13 0504 03/14/13 0845 03/14/13 1421 03/14/13 2232  HGB 10.0*  --   --  10.0*  --   --   --   --   HCT 31.1*  --   --  30.7*  --   --   --   --   PLT 313  --   --  300  --   --   --   --   APTT  --   --  27  --   --   --   --   --   LABPROT  --   --  12.6  --   --   --   --   --   INR  --   --  0.96  --   --   --   --   --   HEPARINUNFRC  --   --   --   --  <0.10*  --  0.24* 0.23*  CREATININE 1.06  --   --  1.06  --  1.07  --   --   TROPONINI  --   < > 2.19*  --  2.92* 1.43*  --  1.33*  < > = values in this interval not displayed.   Assessment: 67yo female remains subtherapeutic on heparin for NSTEMI with slightly lower level despite increased rate.  Goal of Therapy:  Heparin level 0.3-0.7 units/ml   Plan:  Will increase heparin gtt by 3 units/kg/hr to 1750 units/hr and check level in 6-8hr.  Vernard Gambles, PharmD, BCPS  03/14/2013,11:43 PM

## 2013-03-14 NOTE — Progress Notes (Signed)
*  Preliminary Results* Left lower extremity venous duplex completed. Visualized veins of the left lower extremity are negative for deep vein thrombosis. There is no evidence of left Baker's cyst.  03/14/2013 3:25 PM  Gertie Fey, RVT, RDCS, RDMS

## 2013-03-14 NOTE — H&P (Signed)
Family Medicine Teaching [facility] Admission History and Physical Service Pager: [phone]  Patient name: Anita [name] Medical record number: [id number] Date of birth: 1946/06/01 Age: 67 y.o. Gender: female  Primary Care Provider: Everlene Other, DO Consultants: Cardiology Code Status: Chest compressions/DNI  Chief Complaint: Chest pain  Assessment and Plan: Anita [name] is a 67 y.o. year old female presenting with chest pain . PMH is significant for metastatic breast cancer (right breast), CAD, CHF, DM2, COPD, CKD.  1. Chest pain - NSTEMI. troponin elevated at 2.19. EKG shows no specific ST changes. Last A1C: 6.2. Stress test in March showed infarct with no Ischemia. Pain improved on arrival to hospital. Did not receive nitroglycerin. Currently experiencing chest discomfort with breathing. Appreciate cardiology's recommendations.  CT angio negative for PE.  [ ]  f/u troponin trend - continue medical management with heparin gtt and lipitor. Will not add beta blocker in context of COPD.  - ACS risk stratification labs  [ ]  f/u TSH  [ ]  f/u lipid panel  [ ]  f/u A1C  [ ]  f/u AM EKG - admit to telemetry - CT scan negative for PE - continue aspirin 81mg  QD  2. CHF (diastolic) - EF: latest echo: January 2014 55%, grade 2 diastolic dysfunction.  currently fluid overloaded. On torsemide 20mg  daily at home.  - start lasix IV 40 qd [ ]  f/u strict in/out [ ]  f/u daily weight  3. LLE edema - Holman's sign negative, no warms or erythema. No pain on palpation. Left leg edema greater than right. [ ]  venous doppler US  4. Diabetes Mellitus - last A1C: 6.2 [ ]  f/u A1C  5. Positive Urinalysis - UA obtained in ED showed positive nitrites and leukocyte esterase but patient is currently asymptomatic. Will hold on treatment for now.  [ ]  f/u urine culture  Metastatic Breast Cancer - metastasis to bone and possibly lungs given presence of nodules on CT angio.  - continue home letrozole 2.5mg   QD  6. Hyperlipidemia [ ]  f/u lipid panel - continue atorvastatin 40mg  QD  7. COPD - continue tiotropium (spiriva) 1 capsule  8. CKD3: baseline Cr: 1.4-1.8. Currently better than baseline: 1.06 -  Continue to monitor BMP  FEN/GI: Cardiac diet Prophylaxis: Heparin drip  Disposition: Admit to stepdown  History of Present Illness: Anita [name] is a 67 y.o. year old female presenting with chest pain. She was in a chair lifting weights at the [facility]. She went to ride the bikes and had some shortness of breath. 2-3 hours later, she went to eat lunch and had chest pain. She thought it was indigestion. She had a heavy feeling  of her heart which lasted for about 4-5 hours. The pain is similar to a feeling she had in January when she was smoking. It was located in the middle of her chest and radiated to her back. She took aspirin but it did not help. She received aspirin in ED and chest pain was improved, however, she is having chest discomfort when breathing. She had nausea, but no vomiting. No diaphoresis. Also has shortness of breath after walking a few feet which has worsened. Her left leg is more swollen. She has 2 pillow orthopnea and paroxysmal dyspnea. She currently takes home meds for COPD which did not help. No fever or chills. She has a bowel movement every two days but is not regular. While in the ED, the patient had a CTA, which showed no PE. She received one dose of  benadryl 50mg  IV, furosemide 40mg  IV and methylprednisolone 125mg  IV. She was started on heparin.  Review Of Systems: Per HPI with the following additions: No dysuria, frequency, or urgency. +polyuria while on diuretics Otherwise 12 point review of systems was performed and was unremarkable.  Patient Active Problem List   Diagnosis Date Noted  . Hyperlipidemia 02/11/2013  . Breast cancer metastasized to bone 10/14/2012  . Chronic respiratory failure 10/06/2012  . Chronic diastolic congestive heart failure 10/06/2012  . CAD  (coronary artery disease) 10/06/2012  . Chronic kidney disease 10/06/2012  . Anemia of chronic disease 10/06/2012  . Breast mass, right 10/06/2012  . Hypocalcemia 10/06/2012  . Tobacco abuse 10/06/2012  . COPD (chronic obstructive pulmonary disease) 09/27/2012  . DM2 (diabetes mellitus, type 2) 09/26/2012   Past Medical History: Past Medical History  Diagnosis Date  . Fibroid   . Morbid obesity   . 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 COPD (chronic obstructive pulmonary disease)   . Diastolic heart failure   . NSTEMI (non-ST elevated myocardial infarction)   . Anemia   . Breast cancer   . Ulcers of both lower legs   . Tobacco abuse   . Hyperlipidemia 02/11/2013   Past Surgical History: Past Surgical History  Procedure Laterality Date  . Colonoscopy, esophagogastroduodenoscopy (egd) and esophageal dilation N/A 10/13/2012    Procedure: colonoscopy and egd;  Surgeon: Graylin Shiver, MD;  Location: [facility] ENDOSCOPY;  Service: Endoscopy;  Laterality: N/A;   Social History: History  Substance Use Topics  . Smoking status: Former Smoker -- 2.00 packs/day for 40 years    Types: Cigarettes    Quit date: 09/24/2012  . Smokeless tobacco: Never Used  . Alcohol Use: No   Additional social history:   Please also refer to relevant sections of EMR.  Family History: Family History  Problem Relation Age of Onset  . Cancer Mother     leukemia  . Hypertension Mother   . Diabetes Father   . Heart disease Father     passed away due to heart attack   Allergies and Medications: Allergies  Allergen Reactions  . Omnipaque (Iohexol)     Pt claims she developed hives after given contrast  . Benzene Rash   No current facility-administered medications on file prior to encounter.   Current Outpatient Prescriptions on File Prior to Encounter  Medication Sig Dispense Refill  . albuterol (PROVENTIL HFA;VENTOLIN HFA) 108 (90 BASE) MCG/ACT inhaler Inhale 1  puff into the lungs 2 (two) times daily as needed for wheezing or shortness of breath.      [name] aspirin EC 81 MG EC tablet Take 1 tablet (81 mg total) by mouth daily.  30 tablet  3  . atorvastatin (LIPITOR) 40 MG tablet Take 1 tablet (40 mg total) by mouth daily at 6 PM.  30 tablet  5  . Calcium Citrate (CALCITRATE PO) Take 1 tablet by mouth daily.       [name] letrozole (FEMARA) 2.5 MG tablet Take 1 tablet (2.5 mg total) by mouth daily.  30 tablet  3  . Potassium Chloride (KLOR-CON PO) Take 20 mEq by mouth daily.       [name] tiotropium (SPIRIVA HANDIHALER) 18 MCG inhalation capsule Place 1 capsule (18 mcg total) into inhaler and inhale daily.  30 capsule  6    Objective: BP 138/65  Pulse 91  Temp(Src) 98.3 F (36.8 C) (Oral)  Resp 20  SpO2 100% Exam: General: Obese, laying in bed comfortable, in no acute distress HEENT: no scleral icterus Cardiovascular: RRR, no murmurs Respiratory: No wheezes, crackles heard in lung bases bilaterally, good air movement, some dyspnea with talking Breast: Right breast has singular nodule, non-tender, firm Abdomen: Obese, soft, slight tenderness in LRQ, mildly distended Extremities: Edema bilaterally, 1+ pitting on left leg. Non pitting on right. Homan's sign was negative bilaterally. No LE tenderness or warmth Skin: No abnormalities noted Neuro: alert& oriented x3  Labs and Imaging: CBC BMET   Recent Labs Lab 03/13/13 1602  WBC 7.7  HGB 10.0*  HCT 31.1*  PLT 313    Recent Labs Lab 03/13/13 1602  NA 141  K 3.6  CL 105  CO2 23  BUN 32*  CREATININE 1.06  GLUCOSE 251*  CALCIUM 9.8       03/13/2013 18:31  Troponin I 2.19 (HH)     03/13/2013 17:01  D-Dimer, Quant 0.69 (H)    Urinalysis    Component Value Date/Time   COLORURINE YELLOW 03/13/2013 2000   APPEARANCEUR CLOUDY* 03/13/2013 2000   LABSPEC 1.008 03/13/2013 2000   PHURINE 5.5 03/13/2013 2000   GLUCOSEU NEGATIVE 03/13/2013 2000   HGBUR SMALL* 03/13/2013 2000   BILIRUBINUR NEGATIVE  03/13/2013 2000   KETONESUR NEGATIVE 03/13/2013 2000   PROTEINUR NEGATIVE 03/13/2013 2000   UROBILINOGEN 0.2 03/13/2013 2000   NITRITE POSITIVE* 03/13/2013 2000   LEUKOCYTESUR LARGE* 03/13/2013 2000   Urine Culture (7/19) - Pending  EKG: Tachycardia, regular rhythm, ~90 degree axis , no specific ST changes  CT Angiogram of Chest (7/18) IMPRESSION:  Increased predominately right upper lobe ground-glass opacity  pulmonary nodules. Given the presence of bony metastatic disease,  these may also represent pulmonary metastases, although infectious  or inflammatory etiologies could have a similar appearance or, less  likely, primary lung malignancy. Further management is as per the  patient's overall treatment plan.  No CT evidence for acute pulmonary embolism  Chest X-Ray (7/18) IMPRESSION:  1. No acute cardiopulmonary abnormalities.   Jacquelin Hawking, MD 03/14/2013, 2:14 AM PGY-1, [facility] Family Medicine FPTS Intern pager: [phone], text pages welcome   Patient seen, examined. Available data reviewed. Agree with findings, assessment, and plan as outlined by Dr. Caleb Popp.  My additional findings are documented and highlighted above in blue.    Marena Chancy, PGY-3 Family Medicine Resident

## 2013-03-14 NOTE — ED Provider Notes (Signed)
Medical screening examination/treatment/procedure(s) were performed by non-physician practitioner and as supervising physician I was immediately available for consultation/collaboration.    Nelia Shi, MD 03/14/13 [phone]

## 2013-03-14 NOTE — Progress Notes (Signed)
FMTS Attending Daily Note: Anita Levy MD [phone] pager office [phone] I  have seen and examined this patient, reviewed their chart. I have discussed this patient with the resident. I agree with the resident's findings, assessment and care plan. She does not want to proceed with heart cath at this time. I know her fariry well ---her understanding of her health issues including her breast cancer is intact by my previous contact with her---she does have a fairly unusual way of approaching and characterizing her illnesses but I am confident she is competent.  I tried to explain NSTEMI to her but she seemed most relieved to express that she was "glad she did not have a real heart attack". She is quite delightful and a pleasure to take care of and I believe she understands exactly what her options are.

## 2013-03-14 NOTE — Progress Notes (Signed)
Family Medicine Teaching Service Daily Progress Note Intern Pager: [phone]  Patient name: Anita [name] Medical record number: [id number] Date of birth: 1946/04/08 Age: 67 y.o. Gender: female  Primary Care Provider: Everlene Other, DO Consultants: cardiology Code Status: partial code: no intubation  Pt Overview and Major Events to Date:  03/14/13: admission with NSTEMI, started on heparin, continued on ASA and statin  Assessment and Plan: 1. Chest pain - NSTEMI. troponin elevated at 2.19. EKG shows no specific ST changes. Last A1C: 6.2. Stress test in March showed infarct with no Ischemia. Appreciate cardiology recommendations.  No evidence of PE on CT angio  [ ]  f/u troponin trend to peak - continue medical management with heparin gtt, ASA and lipitor. Will not add beta blocker in context of COPD.  [ ]  possible left and right heart cath on Monday: patient not quite ready for this - ACS risk stratification labs: lipid panel within normal limits. [ ]  f/u TSH  [ ]  f/u A1C  [ ]  f/u AM EKG     2. CHF (diastolic) - EF: latest echo: January 2014 55%, grade 2 diastolic dysfunction. currently fluid overloaded. On torsemide 20mg  daily at home.  - lasix 40mg  IV bid per cardiology [ ] possibility of right heart cath for filing pressures given difficulty to assess fluid status on exam with patient's habitus.   [ ] f/u echo [ ]  f/u strict in/out  [ ]  f/u daily weight   3. LLE edema - Homan's sign negative, no warms or erythema. No pain on palpation. Left leg edema greater than right.  [ ]  f/u venous doppler US   4. Diabetes Mellitus - last A1C: 6.2  [ ]  f/u A1C   5. Positive Urinalysis - UA obtained in ED showed positive nitrites and leukocyte esterase - ceftriaxone IV x1 dose given [ ]  f/u urine culture    FEN/GI: heart healthy diet PPx: full dose heparin  Disposition: step down unit, pending improvement  Subjective:  Feeling better. Last feeling of chest discomfort was in the  middle of the night. Now resolved. Improved shortness of breath.   Objective: Temp:  [97.5 F (36.4 C)-98.3 F (36.8 C)] 97.5 F (36.4 C) (07/19 0800) Pulse Rate:  [88-119] 91 (07/18 2115) Resp:  [15-32] 16 (07/19 0530) BP: (96-169)/(52-81) 128/61 mmHg (07/19 0530) SpO2:  [89 %-100 %] 98 % (07/19 0530) Weight:  [232 lb 2.3 oz (105.3 kg)] 232 lb 2.3 oz (105.3 kg) (07/19 0430) Physical Exam: General: no acute distress, sitting in bed Cardiovascular: S1S2, no murmur Respiratory: bibasilar crackles, improved work of breathing compared to last night Abdomen: soft, non tender Extremities: +2 pitting edema in left compared to 1+ in right  Laboratory:  Recent Labs Lab 03/13/13 1602 03/14/13 0500  WBC 7.7 7.8  HGB 10.0* 10.0*  HCT 31.1* 30.7*  PLT 313 300    Recent Labs Lab 03/13/13 1602 03/14/13 0500  NA 141 138  K 3.6 3.3*  CL 105 101  CO2 23 22  BUN 32* 35*  CREATININE 1.06 1.06  CALCIUM 9.8 9.8  GLUCOSE 251* 233*    Lonia Skinner, MD 03/14/2013, 9:18 AM PGY-3, [facility] FPTS Intern pager: [phone], text pages welcome

## 2013-03-14 NOTE — ED Notes (Signed)
Pt assisted to a potty chair in her room.

## 2013-03-14 NOTE — H&P (Signed)
FMTS Attending Daily Note: Obe Ahlers MD [phone] pager office [phone] I  have seen and examined this patient, reviewed their chart. I have discussed this patient with the resident. I agree with the resident's findings, assessment and care plan. 

## 2013-03-14 NOTE — Progress Notes (Addendum)
Patient ID: Anita [name], female   DOB: [birth year], 67 y.o.   MRN: [id number]    SUBJECTIVE: No chest pain, no dyspnea at rest.   . antiseptic oral rinse  15 mL Mouth Rinse BID  . aspirin EC  81 mg Oral Daily  . atorvastatin  40 mg Oral q1800  . calcium citrate  200 mg of elemental calcium Oral TID WC  . furosemide  40 mg Intravenous BID  . letrozole  2.5 mg Oral Daily  . potassium chloride  20 mEq Oral Daily  . potassium chloride  40 mEq Oral Once  . tiotropium  18 mcg Inhalation Daily  heparin gtt    Filed Vitals:   03/14/13 0500 03/14/13 0515 03/14/13 0530 03/14/13 0800  BP: 135/68 130/62 128/61   Pulse:      Temp:    97.5 F (36.4 C)  TempSrc:    Oral  Resp: 16 19 16    Height:      Weight:      SpO2: 99% 94% 98%     Intake/Output Summary (Last 24 hours) at 03/14/13 0842 Last data filed at 03/14/13 0559  Gross per 24 hour  Intake 144.45 ml  Output    800 ml  Net -655.55 ml    LABS: Basic Metabolic Panel:  Recent Labs  40/98/11 1602 03/14/13 0500  NA 141 138  K 3.6 3.3*  CL 105 101  CO2 23 22  GLUCOSE 251* 233*  BUN 32* 35*  CREATININE 1.06 1.06  CALCIUM 9.8 9.8   Liver Function Tests: No results found for this basename: AST, ALT, ALKPHOS, BILITOT, PROT, ALBUMIN,  in the last 72 hours No results found for this basename: LIPASE, AMYLASE,  in the last 72 hours CBC:  Recent Labs  03/13/13 1602 03/14/13 0500  WBC 7.7 7.8  HGB 10.0* 10.0*  HCT 31.1* 30.7*  MCV 74.8* 74.2*  PLT 313 300   Cardiac Enzymes:  Recent Labs  03/13/13 1831 03/14/13 0504  TROPONINI 2.19* 2.92*   BNP: No components found with this basename: POCBNP,  D-Dimer:  Recent Labs  03/13/13 1701  DDIMER 0.69*   Hemoglobin A1C: No results found for this basename: HGBA1C,  in the last 72 hours Fasting Lipid Panel:  Recent Labs  03/14/13 0504  CHOL 177  HDL 61  LDLCALC 96  TRIG 100  CHOLHDL 2.9   Thyroid Function Tests: No results found for this basename: TSH,  T4TOTAL, FREET3, T3FREE, THYROIDAB,  in the last 72 hours Anemia Panel: No results found for this basename: VITAMINB12, FOLATE, FERRITIN, TIBC, IRON, RETICCTPCT,  in the last 72 hours  RADIOLOGY: Ct Angio Chest W/cm &/or Wo Cm  03/13/2013   *RADIOLOGY REPORT*  Clinical Data: Shortness of breath  CT ANGIOGRAPHY CHEST  Technique:  Multidetector CT imaging of the chest using the standard protocol during bolus administration of intravenous contrast. Multiplanar reconstructed images including MIPs were obtained and reviewed to evaluate the vascular anatomy.  Contrast: 80mL OMNIPAQUE IOHEXOL 350 MG/ML SOLN  Comparison: 03/13/2013 chest radiograph, chest CT 10/08/2012  Findings: 6 mm ground-glass opacity nodule in the right upper lobe is stable.  However, there are several new areas of nodular ground- glass airspace opacity measuring less than 5 mm in diameter in the right upper lobe in this general area.  Curvilinear areas of left lower lobe scarring are noted.  No focal filling defect is seen to suggest acute pulmonary embolism. The study is of adequate technical quality for evaluation for  pulmonary embolism up to and including the 3rd order pulmonary arteries.  Trace fluid is noted in the superior pericardial recess. Great vessels are normal in caliber.  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  Mid thoracic central endplate depressions are noted but no new focal compression deformity is seen.  IMPRESSION: Increased predominately right upper lobe ground-glass opacity pulmonary nodules.  Given the presence of bony metastatic disease, these may also represent pulmonary metastases, although infectious or inflammatory etiologies could have a similar appearance or, less likely, primary lung malignancy.  Further management is as per the patient's overall treatment plan.  No CT evidence for acute  pulmonary embolism   Original Report Authenticated By: Christiana Pellant, M.D.   Dg Chest Port 1 View  03/13/2013   *RADIOLOGY REPORT*  Clinical Data: Dyspnea and chest pain  PORTABLE CHEST - 1 VIEW  Comparison: 10/28/2012  Findings: Heart size appears mildly enlarged.  No pleural effusion or edema identified.  No airspace consolidation.  The bony thorax appears intact.  IMPRESSION:  1.  No acute cardiopulmonary abnormalities.   Original Report Authenticated By: Signa Kell, M.D.    PHYSICAL EXAM General: NAD Neck: Thick, JVP 10 cm, no thyromegaly or thyroid nodule.  Lungs: Clear to auscultation bilaterally with normal respiratory effort. CV: Nondisplaced PMI.  Heart regular S1/S2, no S3/S4, no murmur.  1+ ankle edema.   Abdomen: Soft, nontender, no hepatosplenomegaly, no distention.  Neurologic: Alert and oriented x 3.  Psych: Normal affect. Extremities: No clubbing or cyanosis.   TELEMETRY: Reviewed telemetry pt in NSR  ASSESSMENT AND PLAN: 67 yo with history of metastatic breast cancer, diastolic CHF, and presumed CAD presented with dyspnea and chest pain/belching yesterday.  Troponin noted to be elevated consistent with NSTEMI.  1. CHF: Acute on chronic diastolic CHF.  Patient does appear volume overloaded on exam though body habitus makes exam difficult.  - Lasix 40 mg IV bid with potassium repletion.  - Will consider RHC on Monday to assess filling pressures given difficult exam.  2. CAD: NSTEMI.  She had a presentation earlier this year with elevated troponin that was attributed to acute/chronic diastolic CHF.  She had stress test with fixed inferior defect concerning for prior MI.  - ASA, statin, heparin gtt - Cycle troponin to peak - Would favor LHC/RHC on Monday.  - Will get echo.  3. Metastatic breast cancer: On Letrozole, per oncology.   Marca Ancona 03/14/2013 8:47 AM

## 2013-03-14 NOTE — Progress Notes (Signed)
Echo Lab  2D Echocardiogram completed.  Kora Groom L Talayah Picardi, RDCS 03/14/2013 12:28 PM

## 2013-03-14 NOTE — Progress Notes (Signed)
ANTICOAGULATION CONSULT NOTE - Follow Up Consult  Pharmacy Consult for heparin Indication: chest pain/ACS  Allergies  Allergen Reactions  . Omnipaque (Iohexol)     Pt claims she developed hives after given contrast  . Benzene Rash    Patient Measurements: Height: 5\' 2"  (157.5 cm) Weight: 232 lb 2.3 oz (105.3 kg) IBW/kg (Calculated) : 50.1 Heparin Dosing Weight: 75kg  Vital Signs: Temp: 98.1 F (36.7 C) (07/19 0430) Temp src: Oral (07/19 0430) BP: 128/61 mmHg (07/19 0530) Pulse Rate: 91 (07/18 2115)  Labs:  Recent Labs  03/13/13 1602 03/13/13 1831 03/14/13 0500 03/14/13 0504  HGB 10.0*  --  10.0*  --   HCT 31.1*  --  30.7*  --   PLT 313  --  300  --   APTT  --  27  --   --   LABPROT  --  12.6  --   --   INR  --  0.96  --   --   HEPARINUNFRC  --   --   --  <0.10*  CREATININE 1.06  --  1.06  --   TROPONINI  --  2.19*  --  2.92*    Estimated Creatinine Clearance: 60.3 ml/min (by C-G formula based on Cr of 1.06).   Medications:  Scheduled:  . antiseptic oral rinse  15 mL Mouth Rinse BID  . aspirin EC  81 mg Oral Daily  . atorvastatin  40 mg Oral q1800  . calcium citrate  200 mg of elemental calcium Oral TID WC  . furosemide  40 mg Intravenous Daily  . letrozole  2.5 mg Oral Daily  . potassium chloride  20 mEq Oral Daily  . tiotropium  18 mcg Inhalation Daily   Infusions:  . sodium chloride 20 mL (03/14/13 0500)  . heparin 1,050 Units/hr (03/14/13 0400)    Assessment: 67 yo female here with CP and noted with NSTEMI on heparin. CT is negative for PE. Heparin level is < 0.1 on 1050 units/hr (4000 unit bolus given on 7/18).  Goal of Therapy:  Heparin level 0.3-0.7 units/ml Monitor platelets by anticoagulation protocol: Yes   Plan:  -heparin bolus of 2000 units then increase infusion to 1300 units/hr -Heparin level in 6 hours and daily wth CBC daily  Harland German, Pharm D 03/14/2013 8:07 AM

## 2013-03-14 NOTE — ED Notes (Signed)
Pt requested to be placed on o2 at 2 lpm by n/c due to her being on oxygen at home. Pt is placed on 2 lpm o2 by n/c

## 2013-03-14 NOTE — Consult Note (Cosign Needed)
CARDIOLOGY CONSULT NOTE  Patient ID: Anita [name], MRN: [id number], DOB/AGE: 1946-08-02 67 y.o. Admit date: 03/13/2013 Date of Consult: 03/14/2013  Primary Physician: Everlene Other, DO Primary Cardiologist: Dr. Antoine Poche  Chief Complaint: chest pain Reason for Consultation: concern for NSTEMI  HPI: 67 y.o. female w/ PMHx significant for CAD s/p NSTEMI, CHF with nl EF, metastatic breast cancer, obesity who presented to [facility] on 03/13/2013 with complaints of chest pain. Patient reports today during lunch, she reported feeling acutely ill with nausea. Accompanying these symptoms included burping and chest pressure. Thought it was heartburn initially but with due to persistence of symptoms, she sought emergent care.  She reports continued LE swelling but over the last several days, her left left has had worsen swelling. No history of DVT or PE. No hemoptysis.  Due to concerns of PE (sinus tachy, rightward axis on CT, LE swelling, cancer), a PE protocol CT was performed which was negative for PE but suggested lung process of infection vs. Metastasis.  Currently, she is resting comfortably without complaints. On heparin gtt.   Hospitalization earlier this year with CHF and NSTEMI. Recent workup includes a stress MPI 10/30/2012 which demonstrated infarct but no ischemia. EF 49%.  Past Medical History  Diagnosis Date  . Fibroid   . Morbid obesity   . 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 COPD (chronic obstructive pulmonary disease)   . Diastolic heart failure   . NSTEMI (non-ST elevated myocardial infarction)   . Anemia   . Breast cancer   . Ulcers of both lower legs   . Tobacco abuse   . Hyperlipidemia 02/11/2013      Surgical History:  Past Surgical History  Procedure Laterality Date  . Colonoscopy, esophagogastroduodenoscopy (egd) and esophageal dilation N/A 10/13/2012    Procedure: colonoscopy and egd;  Surgeon: Graylin Shiver, MD;   Location: [facility] ENDOSCOPY;  Service: Endoscopy;  Laterality: N/A;     Home Meds: Prior to Admission medications   Medication Sig Start Date End Date Taking? Authorizing Provider  albuterol (PROVENTIL HFA;VENTOLIN HFA) 108 (90 BASE) MCG/ACT inhaler Inhale 1 puff into the lungs 2 (two) times daily as needed for wheezing or shortness of breath.   Yes Historical Provider, MD  aspirin EC 81 MG EC tablet Take 1 tablet (81 mg total) by mouth daily. 10/02/12  Yes Glori Luis, MD  atorvastatin (LIPITOR) 40 MG tablet Take 1 tablet (40 mg total) by mouth daily at 6 PM. 02/11/13  Yes Tommie Sams, DO  Calcium Citrate (CALCITRATE PO) Take 1 tablet by mouth daily.    Yes Historical Provider, MD  letrozole (FEMARA) 2.5 MG tablet Take 1 tablet (2.5 mg total) by mouth daily. 12/19/12  Yes Exie Parody, MD  Potassium Chloride (KLOR-CON PO) Take 20 mEq by mouth daily.    Yes Historical Provider, MD  tiotropium (SPIRIVA HANDIHALER) 18 MCG inhalation capsule Place 1 capsule (18 mcg total) into inhaler and inhale daily. 12/11/12  Yes Tommie Sams, DO  torsemide (DEMADEX) 20 MG tablet Take 20 mg by mouth daily.   Yes Historical Provider, MD    Inpatient Medications:   . sodium chloride    . heparin 1,050 Units/hr (03/13/13 2105)   Allergies:  Allergies  Allergen Reactions  . Omnipaque (Iohexol)     Pt claims she developed hives after given contrast  . Benzene Rash    History   Social History  .  Marital Status: Widowed    Spouse Name: N/A    Number of Children: N/A  . Years of Education: N/A   Occupational History  . Not on file.   Social History Main Topics  . Smoking status: Former Smoker -- 2.00 packs/day for 40 years    Types: Cigarettes    Quit date: 09/24/2012  . Smokeless tobacco: Never Used  . Alcohol Use: No  . Drug Use: No  . Sexually Active: No   Other Topics Concern  . Not on file   Social History Narrative   Lives with husband Alizee Maple [phone]; Daughter who would like to  make medical decisions is [name] [phone]. Patient in school at [employer] for counseling degree.      Family History  Problem Relation Age of Onset  . Cancer Mother     leukemia  . Hypertension Mother   . Diabetes Father   . Heart disease Father     passed away due to heart attack     Review of Systems: General: negative for chills, fever, night sweats or weight changes.  Cardiovascular: as per HPI Dermatological: negative for rash Respiratory: negative for cough or wheezing Urologic: negative for hematuria Abdominal: negative for nausea, vomiting, diarrhea, bright red blood per rectum, melena, or hematemesis Neurologic: negative for visual changes, syncope, or dizziness All other systems reviewed and are otherwise negative except as noted above.  Labs:  Recent Labs  03/13/13 1831  TROPONINI 2.19*   Lab Results  Component Value Date   WBC 7.7 03/13/2013   HGB 10.0* 03/13/2013   HCT 31.1* 03/13/2013   MCV 74.8* 03/13/2013   PLT 313 03/13/2013    Recent Labs Lab 03/13/13 1602  NA 141  K 3.6  CL 105  CO2 23  BUN 32*  CREATININE 1.06  CALCIUM 9.8  GLUCOSE 251*   No results found for this basename: CHOL, HDL, LDLCALC, TRIG   Lab Results  Component Value Date   DDIMER 0.69* 03/13/2013    Radiology/Studies:  Ct Angio Chest W/cm &/or Wo Cm  03/13/2013   *RADIOLOGY REPORT*  Clinical Data: Shortness of breath  CT ANGIOGRAPHY CHEST  Technique:  Multidetector CT imaging of the chest using the standard protocol during bolus administration of intravenous contrast. Multiplanar reconstructed images including MIPs were obtained and reviewed to evaluate the vascular anatomy.  Contrast: 80mL OMNIPAQUE IOHEXOL 350 MG/ML SOLN  Comparison: 03/13/2013 chest radiograph, chest CT 10/08/2012  Findings: 6 mm ground-glass opacity nodule in the right upper lobe is stable.  However, there are several new areas of nodular ground- glass airspace opacity measuring less than 5 mm in  diameter in the right upper lobe in this general area.  Curvilinear areas of left lower lobe scarring are noted.  No focal filling defect is seen to suggest acute pulmonary embolism. The study is of adequate technical quality for evaluation for pulmonary embolism up to and including the 3rd order pulmonary arteries.  Trace fluid is noted in the superior pericardial recess. Great vessels are normal in caliber.  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  Mid thoracic central endplate depressions are noted but no new focal compression deformity is seen.  IMPRESSION: Increased predominately right upper lobe ground-glass opacity pulmonary nodules.  Given the presence of bony metastatic disease, these may also represent pulmonary metastases, although infectious or inflammatory etiologies could have a similar appearance or, less  likely, primary lung malignancy.  Further management is as per the patient's overall treatment plan.  No CT evidence for acute pulmonary embolism   Original Report Authenticated By: Christiana Pellant, M.D.   Dg Chest Port 1 View  03/13/2013   *RADIOLOGY REPORT*  Clinical Data: Dyspnea and chest pain  PORTABLE CHEST - 1 VIEW  Comparison: 10/28/2012  Findings: Heart size appears mildly enlarged.  No pleural effusion or edema identified.  No airspace consolidation.  The bony thorax appears intact.  IMPRESSION:  1.  No acute cardiopulmonary abnormalities.   Original Report Authenticated By: Signa Kell, M.D.    EKG: sinus tach, right ward axis  Physical Exam: Blood pressure 138/65, pulse 91, temperature 98.3 F (36.8 C), temperature source Oral, resp. rate 20, SpO2 100.00%. General: obese, in no acute distress. Head: Normocephalic, atraumatic, sclera non-icteric, no xanthomas, nares are without discharge.  Neck: Supple. Negative for carotid bruits. JVD not visualized due to  body habitus Lungs: Clear bilaterally to auscultation without wheezes, rales, or rhonchi. Breathing is unlabored. Heart: RRR with S1 S2. No murmurs, rubs, or gallops appreciated. Abdomen: Soft, non-tender, non-distended with normoactive bowel sounds. No hepatomegaly. No rebound/guarding. No obvious abdominal masses. Msk:  Strength and tone appear normal for age. Extremities: No clubbing or cyanosis. +2 edema to knees Distal pedal pulses are 2+ and equal bilaterally. Neuro: Alert and oriented X 3. Moves all extremities spontaneously. Psych:  Responds to questions appropriately with a normal affect.   Problem List 1. NSTEMI 2. Breast cancer, known mets to bone, new possible mets to lung parenchyma 3. H./o CAD, s/p MI 4. HFpEF, currently without left sided symptoms 5. Presumed pulmonary hypertension based upon echo 08/2012. COPD and obesity contributing. 5. Lower extremity edema, likely multifactorial 6. COPD 7. Previous smoker  Assessment and Plan:  67 y.o. female w/ PMHx significant for CAD s/p NSTEMI, CHF with nl EF, metastatic breast cancer, obesity who presented to [facility] on [facility]/13/2013 with complaints of chest pain, found to have elevated troponin so presentation is consistent with NSTEMI.  Initial presentation thought to be very consistent with pulmonary embolus but CT angio negative.  Known CAD with prior infarcts. Currently medically managing NSTEMI with heparin, aspirin. Continue statin. Pt not on beta blocker presumably due to pulmonary disease. Serial enzymes until downtrending and telemetry are recommended.   In regards to her heart failure with normal ejection fraction (50% by MPI), she does not appear to have pulmonary congestion.  JVP unable to be seen due to neck habitus. Significant LE edema that is multifactorial. Would continue lasix. Would consider compression hose as well.  CT also noted boney metastases as well as possible mets to the lung. I mentioned this  possibility to the patient but stressed that further evaluation would be necessary to confirm.  Congratulated pt on quitting smoking earlier this year  Recommendations: -continue aspirin, heparin gtt, statin due to NSTEMI. Medically manage for now. -serial enzymes until downtrending, telemetry -continue lasix  Thank you for this consult. Please call with questions. [place] [facility]ology will follow up.  Signed, Adolm Joseph, Pritesh Sobecki C. MD 03/14/2013, 12:52 AM

## 2013-03-14 NOTE — Progress Notes (Signed)
ANTICOAGULATION CONSULT NOTE - Follow Up Consult  Pharmacy Consult for heparin Indication: chest pain/ACS  Allergies  Allergen Reactions  . Omnipaque (Iohexol)     Pt claims she developed hives after given contrast  . Benzene Rash    Patient Measurements: Height: 5\' 2"  (157.5 cm) Weight: 232 lb 2.3 oz (105.3 kg) IBW/kg (Calculated) : 50.1 Heparin Dosing Weight: 75kg  Vital Signs: Temp: 97.1 F (36.2 C) (07/19 1200) Temp src: Oral (07/19 1200) BP: 123/53 mmHg (07/19 1400)  Labs:  Recent Labs  03/13/13 1602 03/13/13 1831 03/14/13 0500 03/14/13 0504 03/14/13 0845 03/14/13 1421  HGB 10.0*  --  10.0*  --   --   --   HCT 31.1*  --  30.7*  --   --   --   PLT 313  --  300  --   --   --   APTT  --  27  --   --   --   --   LABPROT  --  12.6  --   --   --   --   INR  --  0.96  --   --   --   --   HEPARINUNFRC  --   --   --  <0.10*  --  0.24*  CREATININE 1.06  --  1.06  --  1.07  --   TROPONINI  --  2.19*  --  2.92* 1.43*  --     Estimated Creatinine Clearance: 59.7 ml/min (by C-G formula based on Cr of 1.07).   Medications:  Scheduled:  . antiseptic oral rinse  15 mL Mouth Rinse BID  . aspirin EC  81 mg Oral Daily  . atorvastatin  40 mg Oral q1800  . calcium citrate  200 mg of elemental calcium Oral TID WC  . furosemide  40 mg Intravenous BID  . insulin aspart  0-5 Units Subcutaneous QHS  . insulin aspart  0-9 Units Subcutaneous TID WC  . letrozole  2.5 mg Oral Daily  . potassium chloride  20 mEq Oral Daily  . potassium chloride  40 mEq Oral Once  . tiotropium  18 mcg Inhalation Daily   Infusions:  . sodium chloride 20 mL (03/14/13 0500)  . heparin 1,300 Units/hr (03/14/13 1446)    Assessment: 67 yo female here with CP and noted with NSTEMI on heparin. CT is negative for PE. Heparin level is  0.24 on 1300 units/hr   Goal of Therapy:  Heparin level 0.3-0.7 units/ml Monitor platelets by anticoagulation protocol: Yes   Plan:  -Increase heparin to 1450  units/hr -Heparin level in 6 hours and daily wth CBC daily  Harland German, Pharm D 03/14/2013 3:30 PM

## 2013-03-15 DIAGNOSIS — I214 Non-ST elevation (NSTEMI) myocardial infarction: Secondary | ICD-10-CM

## 2013-03-15 LAB — TROPONIN I
Troponin I: 1.08 ng/mL (ref <0.30)
Troponin I: 0.75 ng/mL (ref <0.30)
Troponin I: 0.86 ng/mL (ref <0.30)

## 2013-03-15 LAB — CBC
MCHC: 32.5 g/dL (ref 30.0–36.0)
Platelets: 331 10*3/uL (ref 150–400)
RDW: 19.3 % — ABNORMAL HIGH (ref 11.5–15.5)
WBC: 11.5 10*3/uL — ABNORMAL HIGH (ref 4.0–10.5)

## 2013-03-15 LAB — HEPARIN LEVEL (UNFRACTIONATED)
Heparin Unfractionated: 0.64 IU/mL (ref 0.30–0.70)
Heparin Unfractionated: 0.82 IU/mL — ABNORMAL HIGH (ref 0.30–0.70)

## 2013-03-15 LAB — GLUCOSE, CAPILLARY
Glucose-Capillary: 161 mg/dL — ABNORMAL HIGH (ref 70–99)
Glucose-Capillary: 176 mg/dL — ABNORMAL HIGH (ref 70–99)
Glucose-Capillary: 169 mg/dL — ABNORMAL HIGH (ref 70–99)

## 2013-03-15 LAB — BASIC METABOLIC PANEL
BUN: 43 mg/dL — ABNORMAL HIGH (ref 6–23)
Chloride: 101 mEq/L (ref 96–112)
GFR calc Af Amer: 52 mL/min — ABNORMAL LOW (ref >90)
GFR calc non Af Amer: 45 mL/min — ABNORMAL LOW (ref >90)
Potassium: 3.5 mEq/L (ref 3.5–5.1)

## 2013-03-15 LAB — URINE CULTURE: Colony Count: >=100000

## 2013-03-15 LAB — TSH: TSH: 0.013 u[IU]/mL — ABNORMAL LOW (ref 0.350–4.500)

## 2013-03-15 NOTE — Progress Notes (Signed)
[facility] Attending Daily Note: Purl Claytor MD [phone] pager office [phone] I  have seen and examined this patient, reviewed their chart. I have discussed this patient with the resident. I agree with the resident's findings, assessment and care plan. 

## 2013-03-15 NOTE — Progress Notes (Signed)
ANTICOAGULATION CONSULT NOTE - Follow Up Consult  Pharmacy Consult for heparin Indication: chest pain/ACS  Allergies  Allergen Reactions  . Omnipaque (Iohexol)     Pt claims she developed hives after given contrast  . Benzene Rash    Patient Measurements: Height: 5\' 1"  (154.9 cm) Weight: 234 lb 5.6 oz (106.3 kg) (scale B) IBW/kg (Calculated) : 47.8 Heparin Dosing Weight: 75kg  Vital Signs: Temp: 97.5 F (36.4 C) (07/20 1500) Temp src: Oral (07/20 1500) BP: 137/66 mmHg (07/20 1500) Pulse Rate: 77 (07/20 1500)  Labs:  Recent Labs  03/13/13 1602 03/13/13 1831 03/14/13 0500  03/14/13 0845  03/14/13 2232 03/15/13 0250 03/15/13 0930 03/15/13 1556  HGB 10.0*  --  10.0*  --   --   --   --   --  9.9*  --   HCT 31.1*  --  30.7*  --   --   --   --   --  30.5*  --   PLT 313  --  300  --   --   --   --   --  331  --   APTT  --  27  --   --   --   --   --   --   --   --   LABPROT  --  12.6  --   --   --   --   --   --   --   --   INR  --  0.96  --   --   --   --   --   --   --   --   HEPARINUNFRC  --   --   --   < >  --   < > 0.23*  --  0.64 0.82*  CREATININE 1.06  --  1.06  --  1.07  --   --   --  1.24*  --   TROPONINI  --  2.19*  --   < > 1.43*  --  1.33* 1.08* 0.75* 0.81*  < > = values in this interval not displayed.  Estimated Creatinine Clearance: 50.8 ml/min (by C-G formula based on Cr of 1.24).   Medications:  Scheduled:  . antiseptic oral rinse  15 mL Mouth Rinse BID  . aspirin EC  81 mg Oral Daily  . atorvastatin  40 mg Oral q1800  . calcium citrate  200 mg of elemental calcium Oral TID WC  . furosemide  40 mg Intravenous BID  . insulin aspart  0-5 Units Subcutaneous QHS  . insulin aspart  0-9 Units Subcutaneous TID WC  . letrozole  2.5 mg Oral Daily  . potassium chloride  20 mEq Oral Daily  . potassium chloride  40 mEq Oral Once  . tiotropium  18 mcg Inhalation Daily   Infusions:  . sodium chloride 20 mL (03/14/13 0500)  . heparin 1,750 Units/hr  (03/15/13 0017)    Assessment: 67 y/o female patient receiving heparin gtt for NSTEMI. Xa level is above goal d/t drug accumulation. Will reduce rate. Noted for possible RHC/LHC on Monday.  Goal of Therapy:  Heparin level 0.3-0.7 units/ml Monitor platelets by anticoagulation protocol: Yes   Plan:  Decrease heparin gtt to 1500 units/hr and f/u in am.  Verlene Mayer, PharmD, BCPS Pager [phone]  03/15/2013 6:05 PM

## 2013-03-15 NOTE — Progress Notes (Signed)
Family Medicine Teaching Service Daily Progress Note Intern Pager: [phone]  Patient name: Anita [name] Medical record number: [id number] Date of birth: [birth year] Age: 67 y.o. Gender: female  Primary Care Provider: Everlene Other, DO Consultants: cardiology Code Status: partial code: no intubation  Pt Overview and Major Events to Date:  03/14/13: admission with NSTEMI, started on heparin, continued on ASA and statin  Assessment and Plan: 1. Chest pain - NSTEMI. troponin elevated at 2.19. EKG shows no specific ST changes. Last A1C: 6.2. Stress test in March showed infarct with no Ischemia. Appreciate cardiology recommendations.  No evidence of PE on CT angio [ ]  continue to follow troponin-trending down -appreciate cards help, continue medical management at this time ASA, statin, heparin gtt -rec right and left heart cath tomorrow-patient refusing at this time [ ]  f/u T3, T4-TSH low   2. CHF (diastolic) - EF: latest echo: January 2014 55%, grade 2 diastolic dysfunction. currently fluid overloaded. On torsemide 20mg  daily at home.  - diuresed 2.5 liters on lasix 40 mg IV BID [ ]  possibility of right heart cath for filing pressures given difficulty to assess fluid status on exam with patient's habitus.  [ ]  f/u echo [ ]  f/u strict in/out  [ ]  weight increased by 4 lbs-continue to follow    3. LLE edema - Homan's sign negative, no warms or erythema. No pain on palpation. Edema appears symmetric today, potentially related to CHF.  [ ]  f/u venous doppler US   4. Diabetes Mellitus - last A1C: 6.2  - A1c 6.4 - continue SSI   5. Positive Urinalysis - UA obtained in ED showed positive nitrites and leukocyte esterase - s/p ceftriaxone x1 dose [ ]  f/u urine culture  6. Hyperthyroid: based on TSH of 0.018 [ ]  f/u repeat TSH, T3, T4  7. RUL ground glass opacity: per radiology read DDx includes mets, infection, or inflammatory changes. Given lack of elevated WBC and fever infection seems less  likely. Patient follows with pulmonology and oncology as an outpatient. -would favor outpatient f/u for this issue   FEN/GI: heart healthy diet PPx: full dose heparin  Disposition: transfer to tele, pending consideration of catheterization  Subjective:  States is continuing to feel better. No chest pain or shortness of breath. Cardiology recommended cath and patient states she does not want this as she feels her difficulty breathing was due to fluid on her lungs.    Objective: Temp:  [97.1 F (36.2 C)-98.4 F (36.9 C)] 97.6 F (36.4 C) (07/20 0800) Resp:  [16-22] 18 (07/20 0800) BP: (110-150)/(45-91) 150/91 mmHg (07/20 0800) SpO2:  [93 %-100 %] 98 % (07/20 0937) Weight:  [236 lb 8.9 oz (107.3 kg)] 236 lb 8.9 oz (107.3 kg) (07/20 0526) Physical Exam: General: no acute distress, sitting in chair  Cardiovascular: rrr, no mrg Respiratory: diminished breath sounds in bilateral lung bases Extremities: 1+ pitting edema  Laboratory:  Recent Labs Lab 03/13/13 1602 03/14/13 0500 03/15/13 0930  WBC 7.7 7.8 11.5*  HGB 10.0* 10.0* 9.9*  HCT 31.1* 30.7* 30.5*  PLT 313 300 331    Recent Labs Lab 03/13/13 1602 03/14/13 0500 03/14/13 0845  NA 141 138 137  K 3.6 3.3* 3.3*  CL 105 101 100  CO2 23 22 21   BUN 32* 35* 36*  CREATININE 1.06 1.06 1.07  CALCIUM 9.8 9.8 9.7  GLUCOSE 251* 233* 239*    Glori Luis, MD 03/15/2013, 11:04 AM PGY-2, [facility] FPTS Intern pager: [phone], text  pages welcome

## 2013-03-15 NOTE — Progress Notes (Signed)
ANTICOAGULATION CONSULT NOTE - Follow Up Consult  Pharmacy Consult for heparin Indication: chest pain/ACS  Allergies  Allergen Reactions  . Omnipaque (Iohexol)     Pt claims she developed hives after given contrast  . Benzene Rash    Patient Measurements: Height: 5\' 2"  (157.5 cm) Weight: 236 lb 8.9 oz (107.3 kg) IBW/kg (Calculated) : 50.1 Heparin Dosing Weight: 75kg  Vital Signs: Temp: 97.6 F (36.4 C) (07/20 0800) Temp src: Oral (07/20 0800) BP: 150/91 mmHg (07/20 0800)  Labs:  Recent Labs  03/13/13 1602 03/13/13 1831 03/14/13 0500  03/14/13 0845 03/14/13 1421 03/14/13 2232 03/15/13 0250 03/15/13 0930  HGB 10.0*  --  10.0*  --   --   --   --   --  9.9*  HCT 31.1*  --  30.7*  --   --   --   --   --  30.5*  PLT 313  --  300  --   --   --   --   --  331  APTT  --  27  --   --   --   --   --   --   --   LABPROT  --  12.6  --   --   --   --   --   --   --   INR  --  0.96  --   --   --   --   --   --   --   HEPARINUNFRC  --   --   --   < >  --  0.24* 0.23*  --  0.64  CREATININE 1.06  --  1.06  --  1.07  --   --   --   --   TROPONINI  --  2.19*  --   < > 1.43*  --  1.33* 1.08*  --   < > = values in this interval not displayed.  Estimated Creatinine Clearance: 60.4 ml/min (by C-G formula based on Cr of 1.07).   Medications:  Scheduled:  . antiseptic oral rinse  15 mL Mouth Rinse BID  . aspirin EC  81 mg Oral Daily  . atorvastatin  40 mg Oral q1800  . calcium citrate  200 mg of elemental calcium Oral TID WC  . furosemide  40 mg Intravenous BID  . insulin aspart  0-5 Units Subcutaneous QHS  . insulin aspart  0-9 Units Subcutaneous TID WC  . letrozole  2.5 mg Oral Daily  . potassium chloride  20 mEq Oral Daily  . potassium chloride  40 mEq Oral Once  . tiotropium  18 mcg Inhalation Daily   Infusions:  . sodium chloride 20 mL (03/14/13 0500)  . heparin 1,750 Units/hr (03/15/13 0017)    Assessment: 67 yo female here with CP and noted with NSTEMI on heparin.  CT is negative for PE. Heparin level is  0.64 on 1750 units/hr . Noted for possible RHC/LHC on Monday.  Goal of Therapy:  Heparin level 0.3-0.7 units/ml Monitor platelets by anticoagulation protocol: Yes   Plan:  -No heparin changes needed -Will check a heparin level later today  Harland German, Pharm D 03/15/2013 11:19 AM

## 2013-03-15 NOTE — Progress Notes (Signed)
Patient ID: JOSELINNE LAWAL, female   DOB: [birth year], 67 y.o.   MRN: [id number]    SUBJECTIVE: No chest pain.  Breathing better this morning.  Diuresed reasonably well. [place] negative for DVT, TSH low.   [name] antiseptic oral rinse  15 mL Mouth Rinse BID  . aspirin EC  81 mg Oral Daily  . atorvastatin  40 mg Oral q1800  . calcium citrate  200 mg of elemental calcium Oral TID WC  . furosemide  40 mg Intravenous BID  . insulin aspart  0-5 Units Subcutaneous QHS  . insulin aspart  0-9 Units Subcutaneous TID WC  . letrozole  2.5 mg Oral Daily  . potassium chloride  20 mEq Oral Daily  . potassium chloride  40 mEq Oral Once  . tiotropium  18 mcg Inhalation Daily  heparin gtt    Filed Vitals:   03/14/13 2313 03/15/13 0404 03/15/13 0526 03/15/13 0800  BP: 110/45 121/53  150/91  Pulse:      Temp: 98.3 F (36.8 C) 98.4 F (36.9 C)  97.6 F (36.4 C)  TempSrc: Oral Oral  Oral  Resp: 16   18  Height:      Weight:   107.3 kg (236 lb 8.9 oz)   SpO2: 94% 94%  96%    Intake/Output Summary (Last 24 hours) at 03/15/13 0838 Last data filed at 03/15/13 0400  Gross per 24 hour  Intake 1134.31 ml  Output   2530 ml  Net -1395.69 ml    LABS: Basic Metabolic Panel:  Recent Labs  32/44/01 0500 03/14/13 0845  NA 138 137  K 3.3* 3.3*  CL 101 100  CO2 22 21  GLUCOSE 233* 239*  BUN 35* 36*  CREATININE 1.06 1.07  CALCIUM 9.8 9.7   Liver Function Tests: No results found for this basename: AST, ALT, ALKPHOS, BILITOT, PROT, ALBUMIN,  in the last 72 hours No results found for this basename: LIPASE, AMYLASE,  in the last 72 hours CBC:  Recent Labs  03/13/13 1602 03/14/13 0500  WBC 7.7 7.8  HGB 10.0* 10.0*  HCT 31.1* 30.7*  MCV 74.8* 74.2*  PLT 313 300   Cardiac Enzymes:  Recent Labs  03/14/13 0845 03/14/13 2232 03/15/13 0250  TROPONINI 1.43* 1.33* 1.08*   BNP: No components found with this basename: POCBNP,  D-Dimer:  Recent Labs  03/13/13 1701  DDIMER 0.69*   Hemoglobin  A1C:  Recent Labs  03/14/13 0504  HGBA1C 6.4*   Fasting Lipid Panel:  Recent Labs  03/14/13 0504  CHOL 177  HDL 61  LDLCALC 96  TRIG 100  CHOLHDL 2.9   Thyroid Function Tests:  Recent Labs  03/14/13 0504  TSH 0.018*   Anemia Panel: No results found for this basename: VITAMINB12, FOLATE, FERRITIN, TIBC, IRON, RETICCTPCT,  in the last 72 hours  RADIOLOGY: Ct Angio Chest W/cm &/or Wo Cm  03/13/2013   *RADIOLOGY REPORT*  Clinical Data: Shortness of breath  CT ANGIOGRAPHY CHEST  Technique:  Multidetector CT imaging of the chest using the standard protocol during bolus administration of intravenous contrast. Multiplanar reconstructed images including MIPs were obtained and reviewed to evaluate the vascular anatomy.  Contrast: 80mL OMNIPAQUE IOHEXOL 350 MG/ML SOLN  Comparison: 03/13/2013 chest radiograph, chest CT 10/08/2012  Findings: 6 mm ground-glass opacity nodule in the right upper lobe is stable.  However, there are several new areas of nodular ground- glass airspace opacity measuring less than 5 mm in diameter in the right upper lobe in  this general area.  Curvilinear areas of left lower lobe scarring are noted.  No focal filling defect is seen to suggest acute pulmonary embolism. The study is of adequate technical quality for evaluation for pulmonary embolism up to and including the 3rd order pulmonary arteries.  Trace fluid is noted in the superior pericardial recess. Great vessels are normal in caliber.  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  Mid thoracic central endplate depressions are noted but no new focal compression deformity is seen.  IMPRESSION: Increased predominately right upper lobe ground-glass opacity pulmonary nodules.  Given the presence of bony metastatic disease, these may also represent pulmonary metastases, although infectious or inflammatory  etiologies could have a similar appearance or, less likely, primary lung malignancy.  Further management is as per the patient's overall treatment plan.  No CT evidence for acute pulmonary embolism   Original Report Authenticated By: Christiana Pellant, M.D.   Dg Chest Port 1 View  03/13/2013   *RADIOLOGY REPORT*  Clinical Data: Dyspnea and chest pain  PORTABLE CHEST - 1 VIEW  Comparison: 10/28/2012  Findings: Heart size appears mildly enlarged.  No pleural effusion or edema identified.  No airspace consolidation.  The bony thorax appears intact.  IMPRESSION:  1.  No acute cardiopulmonary abnormalities.   Original Report Authenticated By: Signa Kell, M.D.    PHYSICAL EXAM General: NAD Neck: Thick, JVP 10 cm, no thyromegaly or thyroid nodule.  Lungs: Clear to auscultation bilaterally with normal respiratory effort. CV: Nondisplaced PMI.  Heart regular S1/S2, no S3/S4, no murmur.  1+ ankle edema.   Abdomen: Soft, nontender, no hepatosplenomegaly, no distention.  Neurologic: Alert and oriented x 3.  Psych: Normal affect. Extremities: No clubbing or cyanosis.   TELEMETRY: Reviewed telemetry pt in NSR  ASSESSMENT AND PLAN: 67 yo with history of metastatic breast cancer, diastolic CHF, and presumed CAD presented with dyspnea and chest pain/belching yesterday.  Troponin noted to be elevated consistent with NSTEMI.  1. CHF: Acute on chronic diastolic CHF.  Patient remains volume overloaded, reasonable diuresis yesterday. - Continue Lasix 40 mg IV bid with potassium repletion. Need to check BMET today.  - Consider RHC on Monday to assess filling pressures given difficult exam.  2. CAD: NSTEMI.  She had a presentation earlier this year with elevated troponin that was attributed to acute/chronic diastolic CHF.  She had stress test with fixed inferior defect concerning for prior MI.  - ASA, statin, heparin gtt - Would favor LHC/RHC on Monday.  She does not want cath at this time.  I will keep her NPO in  the morning and have Dr. Antoine Poche (her primary cardiologist) talk to her).  - Will get echo.  3. Metastatic breast cancer: On Letrozole, per oncology.  4. Hyperthyroid by low TSH.  Will repeat TSH and check free T3 and free T4.   Marca Ancona 03/15/2013 8:38 AM

## 2013-03-16 ENCOUNTER — Encounter (HOSPITAL_COMMUNITY): Payer: Self-pay | Admitting: General Practice

## 2013-03-16 LAB — BASIC METABOLIC PANEL
BUN: 43 mg/dL — ABNORMAL HIGH (ref 6–23)
Calcium: 9.9 mg/dL (ref 8.4–10.5)
GFR calc Af Amer: 55 mL/min — ABNORMAL LOW (ref >90)
GFR calc non Af Amer: 48 mL/min — ABNORMAL LOW (ref >90)
Potassium: 3.5 mEq/L (ref 3.5–5.1)
Sodium: 140 mEq/L (ref 135–145)

## 2013-03-16 LAB — GLUCOSE, CAPILLARY: Glucose-Capillary: 147 mg/dL — ABNORMAL HIGH (ref 70–99)

## 2013-03-16 LAB — CBC
Hemoglobin: 10 g/dL — ABNORMAL LOW (ref 12.0–15.0)
MCH: 24 pg — ABNORMAL LOW (ref 26.0–34.0)
MCHC: 32.2 g/dL (ref 30.0–36.0)
Platelets: 347 10*3/uL (ref 150–400)

## 2013-03-16 LAB — TROPONIN I
Troponin I: 0.43 ng/mL (ref <0.30)
Troponin I: 0.52 ng/mL (ref <0.30)

## 2013-03-16 MED ORDER — ISOSORBIDE MONONITRATE ER 30 MG PO TB24
30.0000 mg | ORAL_TABLET | Freq: Every day | ORAL | Status: DC
  Administered 2013-03-16 – 2013-03-17 (×2): 30 mg via ORAL
  Filled 2013-03-16 (×3): qty 1

## 2013-03-16 NOTE — Progress Notes (Signed)
Seen and examined.  Agree with Dr. Dennison Nancy management.  Appreciate cards recs.

## 2013-03-16 NOTE — Progress Notes (Signed)
PCP Note:   Patient seen at bedside.  Patient reports that she is feeling well at this time.  Patient has elected for medical management and no cath.  We discussed Code status and prognosis given metastatic breast cancer.  Patient seems to have limited insight but only wants medical treatment.  If I can be of any assistance, please page me at [phone].

## 2013-03-16 NOTE — Progress Notes (Signed)
SUBJECTIVE:  Breathing back to baseline   PHYSICAL EXAM Filed Vitals:   03/15/13 2116 03/16/13 0017 03/16/13 0425 03/16/13 1121  BP: 145/71 139/72 144/68 124/74  Pulse: 86 77 90 82  Temp: 98.3 F (36.8 C) 98 F (36.7 C) 97.8 F (36.6 C) 97.7 F (36.5 C)  TempSrc: Oral Oral Oral Oral  Resp: 20 18 18 20   Height:      Weight:   234 lb 6.4 oz (106.323 kg)   SpO2: 97% 94% 95% 96%   General:  No distress Lungs:  Clear Heart:  RRR Abdomen:  Positive bowel sounds, no rebound no guarding Extremities:  Mild edema.    LABS: Lab Results  Component Value Date   TROPONINI 0.43* 03/16/2013   Results for orders placed during the hospital encounter of 03/13/13 (from the past 24 hour(s))  TROPONIN I     Status: Abnormal   Collection Time    03/15/13  3:56 PM      Result Value Range   Troponin I 0.81 (*) <0.30 ng/mL  HEPARIN LEVEL (UNFRACTIONATED)     Status: Abnormal   Collection Time    03/15/13  3:56 PM      Result Value Range   Heparin Unfractionated 0.82 (*) 0.30 - 0.70 IU/mL  GLUCOSE, CAPILLARY     Status: Abnormal   Collection Time    03/15/13  4:06 PM      Result Value Range   Glucose-Capillary 176 (*) 70 - 99 mg/dL   Comment 1 Documented in Chart     Comment 2 Notify RN    TROPONIN I     Status: Abnormal   Collection Time    03/15/13  8:44 PM      Result Value Range   Troponin I 0.86 (*) <0.30 ng/mL  GLUCOSE, CAPILLARY     Status: Abnormal   Collection Time    03/15/13  9:14 PM      Result Value Range   Glucose-Capillary 169 (*) 70 - 99 mg/dL   Comment 1 Documented in Chart     Comment 2 Notify RN    TROPONIN I     Status: Abnormal   Collection Time    03/16/13  2:15 AM      Result Value Range   Troponin I 0.98 (*) <0.30 ng/mL  HEPARIN LEVEL (UNFRACTIONATED)     Status: None   Collection Time    03/16/13  6:20 AM      Result Value Range   Heparin Unfractionated 0.34  0.30 - 0.70 IU/mL  BASIC METABOLIC PANEL     Status: Abnormal   Collection Time   03/16/13  6:20 AM      Result Value Range   Sodium 140  135 - 145 mEq/L   Potassium 3.5  3.5 - 5.1 mEq/L   Chloride 101  96 - 112 mEq/L   CO2 27  19 - 32 mEq/L   Glucose, Bld 140 (*) 70 - 99 mg/dL   BUN 43 (*) 6 - 23 mg/dL   Creatinine, Ser 1.61 (*) 0.50 - 1.10 mg/dL   Calcium 9.9  8.4 - 09.6 mg/dL   GFR calc non Af Amer 48 (*) >90 mL/min   GFR calc Af Amer 55 (*) >90 mL/min  CBC     Status: Abnormal   Collection Time    03/16/13  6:20 AM      Result Value Range   WBC 7.0  4.0 - 10.5 K/uL  RBC 4.17  3.87 - 5.11 MIL/uL   Hemoglobin 10.0 (*) 12.0 - 15.0 g/dL   HCT 16.1 (*) 09.6 - 04.5 %   MCV 74.6 (*) 78.0 - 100.0 fL   MCH 24.0 (*) 26.0 - 34.0 pg   MCHC 32.2  30.0 - 36.0 g/dL   RDW 40.9 (*) 81.1 - 91.4 %   Platelets 347  150 - 400 K/uL  GLUCOSE, CAPILLARY     Status: Abnormal   Collection Time    03/16/13  7:55 AM      Result Value Range   Glucose-Capillary 147 (*) 70 - 99 mg/dL  TROPONIN I     Status: Abnormal   Collection Time    03/16/13  8:52 AM      Result Value Range   Troponin I 0.43 (*) <0.30 ng/mL  GLUCOSE, CAPILLARY     Status: Abnormal   Collection Time    03/16/13 12:10 PM      Result Value Range   Glucose-Capillary 122 (*) 70 - 99 mg/dL    Intake/Output Summary (Last 24 hours) at 03/16/13 1414 Last data filed at 03/16/13 1205  Gross per 24 hour  Intake    240 ml  Output   2550 ml  Net  -2310 ml    ASSESSMENT AND PLAN:  CHF:  Down 5 liters since admission.  Continue IV Lasix until discharge.  I will leave a suggestion in the AM about home torsemide dose.    CAD: NSTEMI. Troponin trending down.   EF 55%.  No wall motion abnormalities.  She would like to pursue medical management.  No cath at this time.    Imdur added this admission.   Metastatic breast cancer: On Letrozole, per oncology.   Hyperthyroid by low TSH.   T3 T4 OK.  Continue current therapy.    Rollene Rotunda 03/16/2013 2:14 PM

## 2013-03-16 NOTE — Progress Notes (Signed)
At 0115 AM it was discovered that [facility] tele[facility] was not monitoring patient. Monitoring was started. Patient and Dr. Caleb Popp were informed of situation. No new orders obtained. Patient has had no chest pain this shift and is ambulating in room in good spirits.

## 2013-03-16 NOTE — Progress Notes (Signed)
Family Medicine Teaching Service Daily Progress Note Intern Pager: [phone]  Patient name: RAYONNA HELDMAN Medical record number: [id number] Date of birth: 1946/03/30 Age: 67 y.o. Gender: female  Primary Care Provider: Everlene Other, DO Consultants: cardiology Code Status: partial code: no intubation  Pt Overview and Major Events to Date:  03/14/13: admission with NSTEMI, started on heparin, continued on ASA and statin  Assessment and Plan: 1. Chest pain - NSTEMI. troponin elevated at 2.19. EKG shows no specific ST changes. Last A1C: 6.2. Stress test in March showed infarct with no Ischemia. Appreciate cardiology recommendations.  No evidence of PE on CT angio [ ]  continue to follow troponin- trending up -appreciate cards help, continue medical management at this time ASA, statin, heparin gtt -rec right and left heart cath tomorrow-patient refusing at this time - Free T4, free T3 normal, TSH low   2. CHF (diastolic) - EF: latest echo: January 2014 55%, grade 2 diastolic dysfunction. currently fluid overloaded. On torsemide 20mg  daily at home.  - diuresed 2.5 liters on lasix 40 mg IV BID [ ]  possibility of right heart cath for filing pressures given difficulty to assess fluid status on exam with patient's habitus.  [ ]  f/u echo [ ]  f/u strict in/out  [ ]  f/u weight - 2,546ml (7/21); Total: 4,473ml - Wt: 234lbs (admitted: 232)   3. LLE edema - Homan's sign negative, no warms or erythema. No pain on palpation. Edema appears symmetric today, potentially related to CHF.  [ ]  f/u venous doppler US - not done (7/21)  4. Diabetes Mellitus - last A1C: 6.2  - A1c 6.4 - continue SSI   5. Positive Urinalysis - UA obtained in ED showed positive nitrites and leukocyte esterase - s/p ceftriaxone x1 dose [ ]  urine culture (multiple bacteria)  6. Hyperthyroid: based on TSH of 0.018 [ ]  f/u repeat TSH, T3, T4  7. RUL ground glass opacity: per radiology read DDx includes mets, infection, or  inflammatory changes. Given lack of elevated WBC and fever infection seems less likely. Patient follows with pulmonology and oncology as an outpatient. - would favor outpatient f/u for this issue   FEN/GI: heart healthy diet PPx: full dose heparin  Disposition: transfer to tele, pending consideration of catheterization  Subjective:  States is continuing to feel better. No chest pain or shortness of breath. Cardiology recommended cath and patient states she does not want this as she feels her difficulty breathing was due to fluid on her lungs. She would like to have her pulmonologist and oncologist collaborate with the cardiologist on her treatment.  Objective: Temp:  [97.5 F (36.4 C)-98.3 F (36.8 C)] 97.8 F (36.6 C) (07/21 0425) Pulse Rate:  [77-90] 90 (07/21 0425) Resp:  [18-20] 18 (07/21 0425) BP: (121-145)/(66-80) 144/68 mmHg (07/21 0425) SpO2:  [94 %-98 %] 95 % (07/21 0425) Weight:  [234 lb 5.6 oz (106.3 kg)-234 lb 6.4 oz (106.323 kg)] 234 lb 6.4 oz (106.323 kg) (07/21 0425) Physical Exam: General: no acute distress, laying in bed Cardiovascular: RRR, no murmurs Abdomen: Soft, obese, non-tender Respiratory: diminished breath sounds in bilateral lung bases, slight crackles in bases Extremities: 1+ pitting edema  Laboratory:  Recent Labs Lab 03/14/13 0500 03/15/13 0930 03/16/13 0620  WBC 7.8 11.5* 7.0  HGB 10.0* 9.9* 10.0*  HCT 30.7* 30.5* 31.1*  PLT 300 331 347    Recent Labs Lab 03/14/13 0845 03/15/13 0930 03/16/13 0620  NA 137 139 140  K 3.3* 3.5 3.5  CL 100 101 101  CO2 21 24 27   BUN 36* 43* 43*  CREATININE 1.07 1.24* 1.17*  CALCIUM 9.7 9.9 9.9  GLUCOSE 239* 215* 140*     03/15/2013 09:30 03/15/2013 15:56 03/15/2013 20:44 03/16/2013 02:15  Troponin I 0.75 (HH) 0.81 (HH) 0.86 (HH) 0.98 (HH)    Jacquelin Hawking, MD 03/16/2013, 8:25 AM PGY-1, [facility] FPTS Intern pager: [phone], text pages welcome

## 2013-03-16 NOTE — Progress Notes (Signed)
ANTICOAGULATION CONSULT NOTE - Follow Up Consult  Pharmacy Consult for heparin Indication: chest pain/ACS  Allergies  Allergen Reactions  . Omnipaque (Iohexol)     Pt claims she developed hives after given contrast  . Benzene Rash    Patient Measurements: Height: 5\' 1"  (154.9 cm) Weight: 234 lb 6.4 oz (106.323 kg) (scale b) IBW/kg (Calculated) : 47.8 Heparin Dosing Weight: 75kg  Vital Signs: Temp: 97.7 F (36.5 C) (07/21 1121) Temp src: Oral (07/21 1121) BP: 124/74 mmHg (07/21 1121) Pulse Rate: 82 (07/21 1121)  Labs:  Recent Labs  03/13/13 1602 03/13/13 1831 03/14/13 0500  03/14/13 0845  03/15/13 0930 03/15/13 1556 03/15/13 2044 03/16/13 0215 03/16/13 0620 03/16/13 0852  HGB 10.0*  --  10.0*  --   --   --  9.9*  --   --   --  10.0*  --   HCT 31.1*  --  30.7*  --   --   --  30.5*  --   --   --  31.1*  --   PLT 313  --  300  --   --   --  331  --   --   --  347  --   APTT  --  27  --   --   --   --   --   --   --   --   --   --   LABPROT  --  12.6  --   --   --   --   --   --   --   --   --   --   INR  --  0.96  --   --   --   --   --   --   --   --   --   --   HEPARINUNFRC  --   --   --   < >  --   < > 0.64 0.82*  --   --  0.34  --   CREATININE 1.06  --  1.06  --  1.07  --  1.24*  --   --   --  1.17*  --   TROPONINI  --  2.19*  --   < > 1.43*  < > 0.75* 0.81* 0.86* 0.98*  --  0.43*  < > = values in this interval not displayed.  Estimated Creatinine Clearance: 53.9 ml/min (by C-G formula based on Cr of 1.17).   Medications:  Scheduled:  . antiseptic oral rinse  15 mL Mouth Rinse BID  . aspirin EC  81 mg Oral Daily  . atorvastatin  40 mg Oral q1800  . calcium citrate  200 mg of elemental calcium Oral TID WC  . furosemide  40 mg Intravenous BID  . insulin aspart  0-5 Units Subcutaneous QHS  . insulin aspart  0-9 Units Subcutaneous TID WC  . isosorbide mononitrate  30 mg Oral Daily  . letrozole  2.5 mg Oral Daily  . potassium chloride  20 mEq Oral Daily  .  potassium chloride  40 mEq Oral Once  . tiotropium  18 mcg Inhalation Daily   Infusions:  . sodium chloride 20 mL (03/14/13 0500)  . heparin 1,550 Units/hr (03/15/13 1845)    Assessment: 67 y/o female patient receiving heparin gtt for NSTEMI. Xa level is in goal range  Goal of Therapy:  Heparin level 0.3-0.7 units/ml Monitor platelets by anticoagulation protocol: Yes   Plan:  Cont  heparin gtt at 1500 units/hr and f/u in am.  Talbert Cage, PharmD Pager [phone]  03/16/2013 1:07 PM

## 2013-03-16 NOTE — Progress Notes (Signed)
Utilization Review Completed Kharter Sestak J. Mirely Pangle, RN, BSN, NCM [phone]  

## 2013-03-17 DIAGNOSIS — R079 Chest pain, unspecified: Secondary | ICD-10-CM

## 2013-03-17 DIAGNOSIS — C7952 Secondary malignant neoplasm of bone marrow: Secondary | ICD-10-CM

## 2013-03-17 DIAGNOSIS — C50919 Malignant neoplasm of unspecified site of unspecified female breast: Secondary | ICD-10-CM

## 2013-03-17 DIAGNOSIS — D638 Anemia in other chronic diseases classified elsewhere: Secondary | ICD-10-CM

## 2013-03-17 LAB — GLUCOSE, CAPILLARY
Glucose-Capillary: 114 mg/dL — ABNORMAL HIGH (ref 70–99)
Glucose-Capillary: 136 mg/dL — ABNORMAL HIGH (ref 70–99)

## 2013-03-17 LAB — CBC
HCT: 29.3 % — ABNORMAL LOW (ref 36.0–46.0)
Hemoglobin: 9.4 g/dL — ABNORMAL LOW (ref 12.0–15.0)
WBC: 7.8 10*3/uL (ref 4.0–10.5)

## 2013-03-17 LAB — BASIC METABOLIC PANEL
Chloride: 98 mEq/L (ref 96–112)
GFR calc Af Amer: 51 mL/min — ABNORMAL LOW (ref >90)
Potassium: 3.4 mEq/L — ABNORMAL LOW (ref 3.5–5.1)

## 2013-03-17 MED ORDER — ISOSORBIDE MONONITRATE ER 30 MG PO TB24: 30.0000 mg | ORAL_TABLET | Freq: Every day | ORAL | Status: DC

## 2013-03-17 MED ORDER — NITROGLYCERIN 0.4 MG SL SUBL: 0.4000 mg | SUBLINGUAL_TABLET | SUBLINGUAL | Status: DC | PRN

## 2013-03-17 MED ORDER — TORSEMIDE 20 MG PO TABS: 40.0000 mg | ORAL_TABLET | Freq: Every day | ORAL | Status: DC

## 2013-03-17 MED ORDER — TORSEMIDE 10 MG PO TABS: 10.0000 mg | ORAL_TABLET | ORAL | Status: DC | PRN

## 2013-03-17 NOTE — Progress Notes (Signed)
The patient rested comfortably throughout the night and did not have any complaints of pain.  Her troponin levels were within normal limits this morning.

## 2013-03-17 NOTE — Progress Notes (Signed)
Seen and examined.  OK to DC today as outlined by Dr. Caleb Popp.

## 2013-03-17 NOTE — Progress Notes (Signed)
SUBJECTIVE:  Breathing back to baseline   PHYSICAL EXAM Filed Vitals:   03/16/13 1121 03/16/13 1500 03/16/13 2100 03/17/13 0427  BP: 124/74 130/80 102/50 93/42  Pulse: 82 78 81 74  Temp: 97.7 F (36.5 C) 97 F (36.1 C) 97.7 F (36.5 C) 98.5 F (36.9 C)  TempSrc: Oral Oral Oral Oral  Resp: 20 20 18 19   Height:      Weight:    232 lb 2.3 oz (105.3 kg)  SpO2: 96% 96% 96% 98%   General:  No distress Lungs:  Clear Heart:  RRR Abdomen:  Positive bowel sounds, no rebound no guarding Extremities:  Mild edema.    LABS: Lab Results  Component Value Date   TROPONINI <0.30 03/17/2013   Results for orders placed during the hospital encounter of 03/13/13 (from the past 24 hour(s))  GLUCOSE, CAPILLARY     Status: Abnormal   Collection Time    03/16/13 12:10 PM      Result Value Range   Glucose-Capillary 122 (*) 70 - 99 mg/dL  TROPONIN I     Status: Abnormal   Collection Time    03/16/13  3:00 PM      Result Value Range   Troponin I 0.49 (*) <0.30 ng/mL  GLUCOSE, CAPILLARY     Status: Abnormal   Collection Time    03/16/13  4:12 PM      Result Value Range   Glucose-Capillary 222 (*) 70 - 99 mg/dL  GLUCOSE, CAPILLARY     Status: Abnormal   Collection Time    03/16/13  9:03 PM      Result Value Range   Glucose-Capillary 127 (*) 70 - 99 mg/dL   Comment 1 Documented in Chart     Comment 2 Notify RN    TROPONIN I     Status: Abnormal   Collection Time    03/16/13  9:11 PM      Result Value Range   Troponin I 0.52 (*) <0.30 ng/mL  TROPONIN I     Status: None   Collection Time    03/17/13  5:35 AM      Result Value Range   Troponin I <0.30  <0.30 ng/mL  HEPARIN LEVEL (UNFRACTIONATED)     Status: Abnormal   Collection Time    03/17/13  5:35 AM      Result Value Range   Heparin Unfractionated <0.10 (*) 0.30 - 0.70 IU/mL  BASIC METABOLIC PANEL     Status: Abnormal   Collection Time    03/17/13  5:35 AM      Result Value Range   Sodium 136  135 - 145 mEq/L   Potassium  3.4 (*) 3.5 - 5.1 mEq/L   Chloride 98  96 - 112 mEq/L   CO2 23  19 - 32 mEq/L   Glucose, Bld 159 (*) 70 - 99 mg/dL   BUN 45 (*) 6 - 23 mg/dL   Creatinine, Ser 0.34 (*) 0.50 - 1.10 mg/dL   Calcium 9.7  8.4 - 74.2 mg/dL   GFR calc non Af Amer 44 (*) >90 mL/min   GFR calc Af Amer 51 (*) >90 mL/min  CBC     Status: Abnormal   Collection Time    03/17/13  5:35 AM      Result Value Range   WBC 7.8  4.0 - 10.5 K/uL   RBC 3.95  3.87 - 5.11 MIL/uL   Hemoglobin 9.4 (*) 12.0 - 15.0 g/dL  HCT 29.3 (*) 36.0 - 46.0 %   MCV 74.2 (*) 78.0 - 100.0 fL   MCH 23.8 (*) 26.0 - 34.0 pg   MCHC 32.1  30.0 - 36.0 g/dL   RDW 16.1 (*) 09.6 - 04.5 %   Platelets 307  150 - 400 K/uL    Intake/Output Summary (Last 24 hours) at 03/17/13 0859 Last data filed at 03/17/13 0430  Gross per 24 hour  Intake    480 ml  Output   1350 ml  Net   -870 ml    ASSESSMENT AND PLAN:  CHF:  Down 5.2 liters since admission. Send home on Torsemide 40 mg daily with PRN 10 mg with 2 lb weight gain daily.    She will need very close follow up of her electrolytes as an outpatient.    CAD: NSTEMI. Troponin trending down.   EF 55%.  No wall motion abnormalities.  She would like to pursue medical management.  No cath at this time.    Imdur added this admission.   Troponin normal this AM.   Send home with SLNTG PRN.   Metastatic breast cancer: On Letrozole, per oncology.   Hyperthyroid by low TSH.   T3 T4 OK.  Continue current therapy.    Rollene Rotunda 03/17/2013 8:59 AM

## 2013-03-17 NOTE — Progress Notes (Signed)
Family Medicine Teaching Service Daily Progress Note Intern Pager: [phone]  Patient name: Anita [name] Medical record number: [id number] Date of birth: Sep 02, 1945 Age: 67 y.o. Gender: female  Primary Care Provider: Everlene Other, DO Consultants: cardiology Code Status: partial code: no intubation  Pt Overview and Major Events to Date:  03/14/13: admission with NSTEMI, started on heparin, continued on ASA and statin  Assessment and Plan: 1. Chest pain - NSTEMI. troponin elevated at 2.19. EKG shows no specific ST changes. Last A1C: 6.2. Stress test in March showed infarct with no Ischemia. Appreciate cardiology recommendations.  No evidence of PE on CT angio [ ]  continue to follow troponin - trending down - appreciate cards help, continue medical management at this time - ASA, statin, heparin gtt - rec right and left heart cath tomorrow - patient refusing at this time  2. CHF (diastolic) - EF: latest echo: January 2014 55%, grade 2 diastolic dysfunction. currently fluid overloaded. On torsemide 20mg  daily at home.  - diuresed 2.5 liters on lasix 40 mg IV BID [ ]  possibility of right heart cath for filing pressures given difficulty to assess fluid status on exam with patient's habitus.  [ ]  f/u echo [ ]  f/u strict in/out  [ ]  f/u weight - (7/22); Total: 5,218ml - Wt: 232lbs (admitted: 232)   3. LLE edema - Homan's sign negative, no warms or erythema. No pain on palpation. Edema appears symmetric today, potentially related to CHF.   4. Diabetes Mellitus - last A1C: 6.2  - A1c 6.4 - last CBG: 127 - continue SSI, 6 units aspart last 24 hours  5. Positive Urinalysis - UA obtained in ED showed positive nitrites and leukocyte esterase - urine culture (multiple bacteria)  6. Hypothyroid: based on TSH of 0.018 - Free T4, free T3 normal, TSH low, pt does not take thyroid medication  7. RUL ground glass opacity: per radiology read DDx includes mets, infection, or inflammatory  changes. Given lack of elevated WBC and fever infection seems less likely. Patient follows with pulmonology and oncology as an outpatient. - would favor outpatient f/u for this issue  FEN/GI: heart healthy diet PPx: full dose heparin  Disposition: Home today pending recommendations for home diuresis  Subjective: [name] has no complaints overnight. No shortness of breath, no palpitations, no nausea/vomiting.  Objective: Temp:  [97 F (36.1 C)-98.5 F (36.9 C)] 98.5 F (36.9 C) (07/22 0427) Pulse Rate:  [74-82] 74 (07/22 0427) Resp:  [18-20] 19 (07/22 0427) BP: (93-130)/(42-80) 93/42 mmHg (07/22 0427) SpO2:  [94 %-98 %] 94 % (07/22 0911) Weight:  [232 lb 2.3 oz (105.3 kg)] 232 lb 2.3 oz (105.3 kg) (07/22 0427)  Physical Exam: General: no acute distress, laying in bed Cardiovascular: RRR, no murmurs Abdomen: Soft, obese, non-tender Respiratory: diminished breath sounds in bilateral lung bases, slight crackles in bases Extremities: 1+ pitting edema bilaterally  Laboratory:  Recent Labs Lab 03/15/13 0930 03/16/13 0620 03/17/13 0535  WBC 11.5* 7.0 7.8  HGB 9.9* 10.0* 9.4*  HCT 30.5* 31.1* 29.3*  PLT 331 347 307    Recent Labs Lab 03/15/13 0930 03/16/13 0620 03/17/13 0535  NA 139 140 136  K 3.5 3.5 3.4*  CL 101 101 98  CO2 24 27 23   BUN 43* 43* 45*  CREATININE 1.24* 1.17* 1.25*  CALCIUM 9.9 9.9 9.7  GLUCOSE 215* 140* 159*     03/16/2013 02:15 03/16/2013 08:52 03/16/2013 15:00 03/16/2013 21:11 03/17/2013 05:35  Troponin I 0.98 (HH) 0.43 (HH) 0.49 (  HH) 0.52 (HH) <0.30    Jacquelin Hawking, MD 03/17/2013, 9:15 AM PGY-1, [facility] Family Medicine FPTS Intern pager: [phone], text pages welcome

## 2013-03-17 NOTE — Care Management Note (Signed)
    Page 1 of 1   03/17/2013     2:08:41 PM   CARE MANAGEMENT NOTE 03/17/2013  Patient:  Anita [name], Anita [name]   Account Number:  [id number]  Date Initiated:  03/17/2013  Documentation initiated by:  [facility]  Subjective/Objective Assessment:   67 y.o. year old female presenting with chest pain . PMH is significant for metastatic breast cancer (right breast), CAD, CHF, DM2, COPD, CKD.// hm with spouse     Action/Plan:   heart cath//hm with self care   Anticipated DC Date:  03/17/2013   Anticipated DC Plan:  HOME/SELF CARE      DC Planning Services  CM consult      Choice offered to / List presented to:             Status of service:   Medicare Important Message given?   (If response is "NO", the following Medicare IM given date fields will be blank) Date Medicare IM given:   Date Additional Medicare IM given:    Discharge Disposition:    Per UR Regulation:    If discussed at Long Length of Stay Meetings, dates discussed:    Comments:  03/17/13@1400 .Marland KitchenMarland KitchenOletta Cohn, RN, BSN, [place] [phone] Pt refuses heart cath.

## 2013-03-18 ENCOUNTER — Telehealth: Payer: Self-pay | Admitting: Cardiology

## 2013-03-18 ENCOUNTER — Telehealth: Payer: Self-pay | Admitting: Pulmonary Disease

## 2013-03-18 ENCOUNTER — Telehealth: Payer: Self-pay | Admitting: *Deleted

## 2013-03-18 DIAGNOSIS — I5032 Chronic diastolic (congestive) heart failure: Secondary | ICD-10-CM

## 2013-03-18 DIAGNOSIS — I2510 Atherosclerotic heart disease of native coronary artery without angina pectoris: Secondary | ICD-10-CM

## 2013-03-18 NOTE — Telephone Encounter (Signed)
Please inform pt that I have reviewed her records from recent hospital stay.  She appears to have had a heart attack and congestive heart failure.  These are different conditions than the COPD I treat her for.  She was seen by cardiology who are the experts in treating heart attacks and congestive heart failure.  As such there wasn't a need to have pulmonary evaluation while she was in hospital.

## 2013-03-18 NOTE — Telephone Encounter (Signed)
Please inform pt that I have reviewed her records from recent hospital stay. She appears to have had a heart attack and congestive heart failure. These are different conditions than the COPD I treat her for. She was seen by cardiology who are the experts in treating heart attacks and congestive heart failure. As such there wasn't a need to have pulmonary evaluation while she was in hospital.       Spoke with the pt and notified of recs per VS and she verbalized understanding  She states that her she is concerned about the ct chest that was done  One of the docs told her she may have lung CA  She is asking specifically for VS's opinion on ct- report below, pls advise, thanks!  RADIOLOGY REPORT*  Clinical Data: Shortness of breath  CT ANGIOGRAPHY CHEST  Technique: Multidetector CT imaging of the chest using the  standard protocol during bolus administration of intravenous  contrast. Multiplanar reconstructed images including MIPs were  obtained and reviewed to evaluate the vascular anatomy.  Contrast: 80mL OMNIPAQUE IOHEXOL 350 MG/ML SOLN  Comparison: 03/13/2013 chest radiograph, chest CT 10/08/2012  Findings: 6 mm ground-glass opacity nodule in the right upper lobe  is stable. However, there are several new areas of nodular ground-  glass airspace opacity measuring less than 5 mm in diameter in the  right upper lobe in this general area. Curvilinear areas of left  lower lobe scarring are noted.  No focal filling defect is seen to suggest acute pulmonary  embolism. The study is of adequate technical quality for evaluation  for pulmonary embolism up to and including the 3rd order pulmonary  arteries. Trace fluid is noted in the superior pericardial recess.  Great vessels are normal in caliber.  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 Mid thoracic central endplate   depressions are noted but no new focal compression deformity is  seen.  IMPRESSION:  Increased predominately right upper lobe ground-glass opacity  pulmonary nodules. Given the presence of bony metastatic disease,  these may also represent pulmonary metastases, although infectious  or inflammatory etiologies could have a similar appearance or, less  likely, primary lung malignancy. Further management is as per the  patient's overall treatment plan.  No CT evidence for acute pulmonary embolism

## 2013-03-18 NOTE — Telephone Encounter (Signed)
LMTCB and will hold in triage until the pt calls back

## 2013-03-18 NOTE — Telephone Encounter (Signed)
Error.Anita [name] ° °

## 2013-03-18 NOTE — Telephone Encounter (Signed)
Informed pt that Dr. Myra Rude reviewed her CT and states no intervention at this time.  Instructed to keep her appt for CT scans 9/15 and office visit on 9/22 as scheduled.  She verbalized understanding.

## 2013-03-18 NOTE — Telephone Encounter (Signed)
Reviewed CT chest results.  Discussed with pt.  Explained she has several small pulmonary nodules.  She will need radiographic follow up which she reports has been scheduled for September 2014.

## 2013-03-18 NOTE — Telephone Encounter (Signed)
03/13/2013 *RADIOLOGY REPORT* Clinical Data: Shortness of breath CT ANGIOGRAPHY CHEST Technique: Multidetector CT imaging of the chest using the standard protocol during bolus administration of intravenous contrast. Multiplanar reconstructed images including MIPs were obtained and reviewed to evaluate the vascular anatomy. Contrast: 80mL OMNIPAQUE IOHEXOL 350 MG/ML SOLN Comparison: 03/13/2013 chest radiograph, chest CT 10/08/2012 Findings: 6 mm ground-glass opacity nodule in the right upper lobe is stable. However, there are several new areas of nodular ground- glass airspace opacity measuring less than 5 mm in diameter in the right upper lobe in this general area. Curvilinear areas of left lower lobe scarring are noted. No focal filling defect is seen to suggest acute pulmonary embolism. The study is of adequate technical quality for evaluation for pulmonary embolism up to and including the 3rd order pulmonary arteries. Trace fluid is noted in the superior pericardial recess. Great vessels are normal in caliber. 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 Mid thoracic central endplate depressions are noted but no new focal compression deformity is seen. IMPRESSION: Increased predominately right upper lobe ground-glass opacity pulmonary nodules. Given the presence of bony metastatic disease, these may also represent pulmonary metastases, although infectious or inflammatory etiologies could have a similar appearance or, less likely, primary lung malignancy. Further management is as per the patient's overall treatment plan. No CT evidence for acute pulmonary embolism Original Report Authenticated By: Christiana Pellant, M.D.  Nodes are small and at present time do not require any intervention. Need to be follow by scan according NCCN. Please keep appt on 04/2013.                             V.R.

## 2013-03-18 NOTE — Telephone Encounter (Signed)
New prob  Pt wants to know if she can do cardiac rehab at [facility].  She said she called her insurance company first and they said she needs to call here and speak with the doctor.

## 2013-03-18 NOTE — Telephone Encounter (Signed)
Pt reports she was recently hospitalized for MI.  She was told that there is a "spot" on her lung that could be cancer, on Chest CT.   Next office visit here on 05/18/13 with w/ CT Chest, Abd/Pel scheduled for 9/15.   Pt asks if she needs f/u sooner than 9/22 and if she needs to keep her appt for the CT scans since she had the CT Chest done?

## 2013-03-18 NOTE — Telephone Encounter (Signed)
Will have Dr Antoine Poche to review and call pt with orders

## 2013-03-18 NOTE — Telephone Encounter (Signed)
Pt  Calling in ref to previous msg.Anita [name]

## 2013-03-18 NOTE — Telephone Encounter (Addendum)
Spoke with the pt and notified of recs per VS and she verbalized understanding She states that her she is concerned about the ct chest that was done One of the docs told her she may have lung CA She is asking specifically for VS's opinion on ct- report below, pls advise, thanks!  RADIOLOGY REPORT*  Clinical Data: Shortness of breath  CT ANGIOGRAPHY CHEST  Technique: Multidetector CT imaging of the chest using the  standard protocol during bolus administration of intravenous  contrast. Multiplanar reconstructed images including MIPs were  obtained and reviewed to evaluate the vascular anatomy.  Contrast: 80mL OMNIPAQUE IOHEXOL 350 MG/ML SOLN  Comparison: 03/13/2013 chest radiograph, chest CT 10/08/2012  Findings: 6 mm ground-glass opacity nodule in the right upper lobe  is stable. However, there are several new areas of nodular ground-  glass airspace opacity measuring less than 5 mm in diameter in the  right upper lobe in this general area. Curvilinear areas of left  lower lobe scarring are noted.  No focal filling defect is seen to suggest acute pulmonary  embolism. The study is of adequate technical quality for evaluation  for pulmonary embolism up to and including the 3rd order pulmonary  arteries. Trace fluid is noted in the superior pericardial recess.  Great vessels are normal in caliber.  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 Mid thoracic central endplate  depressions are noted but no new focal compression deformity is  seen.  IMPRESSION:  Increased predominately right upper lobe ground-glass opacity  pulmonary nodules. Given the presence of bony metastatic disease,  these may also represent pulmonary metastases, although infectious  or inflammatory etiologies could have a similar appearance or, less  likely, primary lung malignancy. Further management is  as per the  patient's overall treatment plan.  No CT evidence for acute pulmonary embolism

## 2013-03-19 NOTE — Telephone Encounter (Signed)
OK for rehab at [facility]

## 2013-03-19 NOTE — Telephone Encounter (Signed)
Please see my phone note from 03/18/13 at 5pm.

## 2013-03-19 NOTE — Telephone Encounter (Signed)
Left message for pt OK for cardiac rehab at [facility].  Order placed

## 2013-03-19 NOTE — Telephone Encounter (Signed)
See other PN VS already has d/w pt

## 2013-03-20 NOTE — Discharge Summary (Signed)
Seen and examined on the day of DC.  Agree with Dr. Dennison Nancy management and documentation.

## 2013-03-20 NOTE — Discharge Summary (Signed)
Family Medicine Teaching [facility] Discharge Summary  Patient name: Anita [name] Medical record number: [id number] Date of birth: 24-Jul-1946 Age: 67 y.o. Gender: female Date of Admission: 03/13/2013  Date of Discharge: 03/17/2013  Admitting Physician: Nestor Ramp, MD  Primary Care Provider: Everlene Other, DO Consultants: Cardiology  Indication for Hospitalization: Chest Pain  Discharge Diagnoses/Problem List:  1. Chest Pain 2. Diastolic CHF 3. Left Lower Extremity Edema 4. Diabetes Mellitus  Disposition: Home  Discharge Condition: Stable  Brief Hospital Course:   NSTEMI On admission, troponin was elevated at 2.19. Eky showed no specific ST changes. When she came to the hospital, she did not receive nitroglycerin, but her pain improved. At admission, she was experiencing chest discomfort with breathing. She obtained a CT angiogram, which was negative. Her troponin levels were trended and were trending up. Medical management was continued with heparin gtt and lipitor. A beta blocker was not added because of COPD. Cardiology recommended cardiac catheterization, but the patient declined to have the procedure and instead opted for medical management. She was started on Imdur. Her troponin levels eventually trended down. On discharge, she was sent on sub lingual nitrogen. No cath was performed.  Diastolic CHF Patient has an EF of 55% and was on torsemide 20mg  daily at home. She was started on lasix 40mg  BID. Total diuresis was . Weight on admission was 232lbs and returned to 232lbs on discharge. ProBNP was 2987 during this admission. Her physical exam revealed slight crackles bilaterally and persistent 1+ pitting edema.   Left Lower Extremity Edema Patient had more edema in her left leg compared to her right. Her holman's sign was negative with no erythema . D-dimer was elevated at 0.69. Venous doppler was negative for DVT.  Diabetes Mellitus Last A1C was 6.2. Was started on  sensitive sliding scale insulin. CBGs ranged from 114-262 and insulin was adjusted accordingly.  Issues for Follow Up:  1. Ground glass opacity on chest x-ray with a differential including metastatic  2. Better control of CHF symptoms  Significant Procedures: None  Significant Labs and Imaging:   Recent Labs Lab 03/15/13 0930 03/16/13 0620 03/17/13 0535  WBC 11.5* 7.0 7.8  HGB 9.9* 10.0* 9.4*  HCT 30.5* 31.1* 29.3*  PLT 331 347 307    Recent Labs Lab 03/14/13 0500 03/14/13 0845 03/15/13 0930 03/16/13 0620 03/17/13 0535  NA 138 137 139 140 136  K 3.3* 3.3* 3.5 3.5 3.4*  CL 101 100 101 101 98  CO2 22 21 24 27 23   GLUCOSE 233* 239* 215* 140* 159*  BUN 35* 36* 43* 43* 45*  CREATININE 1.06 1.07 1.24* 1.17* 1.25*  CALCIUM 9.8 9.7 9.9 9.9 9.7   EKG: Tachycardia, regular rhythm, ~90 degree axis , no specific ST changes  CT Angiogram of Chest (7/18)  IMPRESSION:  Increased predominately right upper lobe ground-glass opacity  pulmonary nodules. Given the presence of bony metastatic disease,  these may also represent pulmonary metastases, although infectious  or inflammatory etiologies could have a similar appearance or, less  likely, primary lung malignancy. Further management is as per the  patient's overall treatment plan.  No CT evidence for acute pulmonary embolism   Chest X-Ray (7/18)  IMPRESSION:  1. No acute cardiopulmonary abnormalities.   Outstanding Results: None  Discharge Medications:    Medication List         albuterol 108 (90 BASE) MCG/ACT inhaler  Commonly known as:  PROVENTIL HFA;VENTOLIN HFA  Inhale 1 puff into the lungs 2 (  two) times daily as needed for wheezing or shortness of breath.     aspirin 81 MG EC tablet  Take 1 tablet (81 mg total) by mouth daily.     atorvastatin 40 MG tablet  Commonly known as:  LIPITOR  Take 1 tablet (40 mg total) by mouth daily at 6 PM.     CALCITRATE PO  Take 1 tablet by mouth daily.     isosorbide  mononitrate 30 MG 24 hr tablet  Commonly known as:  IMDUR  Take 1 tablet (30 mg total) by mouth daily.     KLOR-CON PO  Take 20 mEq by mouth daily.     letrozole 2.5 MG tablet  Commonly known as:  FEMARA  Take 1 tablet (2.5 mg total) by mouth daily.     nitroGLYCERIN 0.4 MG SL tablet  Commonly known as:  NITROSTAT  Place 1 tablet (0.4 mg total) under the tongue every 5 (five) minutes as needed for chest pain.     tiotropium 18 MCG inhalation capsule  Commonly known as:  SPIRIVA HANDIHALER  Place 1 capsule (18 mcg total) into inhaler and inhale daily.     torsemide 20 MG tablet  Commonly known as:  DEMADEX  Take 2 tablets (40 mg total) by mouth daily.     torsemide 10 MG tablet  Commonly known as:  DEMADEX  Take 1 tablet (10 mg total) by mouth as needed (for 2 pound weight gain per day).        Discharge Instructions: Please refer to Patient Instructions section of EMR for full details.  Patient was counseled important signs and symptoms that should prompt return to medical care, changes in medications, dietary instructions, activity restrictions, and follow up appointments.   Follow-Up Appointments: Follow-up Information   Follow up with Everlene Other, DO On 03/23/2013. (2:15PM)    Contact information:   [address] [place] [address] [phone]       Jacquelin Hawking, MD 03/20/2013, 12:15 AM PGY-1, [facility]

## 2013-03-23 ENCOUNTER — Encounter: Payer: Self-pay | Admitting: Family Medicine

## 2013-03-23 ENCOUNTER — Ambulatory Visit (INDEPENDENT_AMBULATORY_CARE_PROVIDER_SITE_OTHER): Payer: Medicare Other | Admitting: Family Medicine

## 2013-03-23 VITALS — BP 123/77 | HR 96 | Ht 61.0 in | Wt 231.5 lb

## 2013-03-23 DIAGNOSIS — J449 Chronic obstructive pulmonary disease, unspecified: Secondary | ICD-10-CM

## 2013-03-23 DIAGNOSIS — I5032 Chronic diastolic (congestive) heart failure: Secondary | ICD-10-CM

## 2013-03-23 DIAGNOSIS — E785 Hyperlipidemia, unspecified: Secondary | ICD-10-CM

## 2013-03-23 DIAGNOSIS — I2510 Atherosclerotic heart disease of native coronary artery without angina pectoris: Secondary | ICD-10-CM

## 2013-03-23 DIAGNOSIS — K5900 Constipation, unspecified: Secondary | ICD-10-CM | POA: Insufficient documentation

## 2013-03-23 DIAGNOSIS — I509 Heart failure, unspecified: Secondary | ICD-10-CM

## 2013-03-23 DIAGNOSIS — G4700 Insomnia, unspecified: Secondary | ICD-10-CM

## 2013-03-23 MED ORDER — TRAZODONE HCL 50 MG PO TABS: 25.0000 mg | ORAL_TABLET | Freq: Every evening | ORAL | Status: DC | PRN

## 2013-03-23 MED ORDER — POLYETHYLENE GLYCOL 3350 17 GM/SCOOP PO POWD: 17.0000 g | Freq: Every day | ORAL | Status: DC | PRN

## 2013-03-23 MED ORDER — DOCUSATE SODIUM 100 MG PO CAPS: 100.0000 mg | ORAL_CAPSULE | Freq: Two times a day (BID) | ORAL | Status: DC | PRN

## 2013-03-23 NOTE — Assessment & Plan Note (Signed)
Rx sent for colace and Miralax.

## 2013-03-23 NOTE — Progress Notes (Signed)
Subjective:     Patient ID: Anita [name], female   DOB: 1946-07-17, 67 y.o.   MRN: [id number]  HPI Anita [name] presents today for hospital follow.  She was recently admitted for NSTEMI.  1) CAD, Recent NSTEMI - Patient declined Catherization during admission and opted for medical management. - She is currently doing well and declines any chest pain. - Endorses compliance with statin, Imdur, Aspirin - Followed by Cardiology  2) CHF, diastolic - Patient reports LE edema but states that it is improved. - She endorses SOB with walking/activity (NYHA class II-III)\ - Denies orthopnea or PND - Endorses compliance with Toresemide  3) COPD - Stable on albuterol and Spiriva - Patient on O2 at night.  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 - She states that her BM's are hard and difficult pass - No changes in diet.  No melena or hematochezia.  Social history - Former smoker.  Review of Systems Per HPI    Objective:   Physical Exam Exam: General: pleasant 67 year old in NAD. Cardiovascular: RRR. No murmurs, rubs, or gallops. Respiratory: Bibasilar course rales. No wheezing.  Abdomen: soft, nontender, nondistended. Extremities: 1-2 + pitting pedal edema.     Assessment:     See Problem list     Plan:

## 2013-03-23 NOTE — Assessment & Plan Note (Signed)
Patient declined catherization during admission and opted for medical management. Will continue Aspirin, Imdur, PRN Nitro. Will consider adding beta blocker if BP and lungs allow.  Will also consider ACEI at next visit.

## 2013-03-23 NOTE — Assessment & Plan Note (Signed)
Stable on Lipitor.

## 2013-03-23 NOTE — Patient Instructions (Addendum)
It was good to see you today.  Take 50 mg of torsemide daily for the next 3 days and then resume 40 mg daily.  If you develop worsening SOB, please come to the clinic or go to the ED.  Continue to take all of your other medication as prescribed.l

## 2013-03-23 NOTE — Assessment & Plan Note (Signed)
Currently well controlled on Albuterol and Spiriva. Patient ambulated today and Oxygen saturation fell to 88% with activity.  Will send Rx for oxygen (to be used with activity)

## 2013-03-23 NOTE — Assessment & Plan Note (Signed)
Bibasilar rales and LE edema on exam. Patient's weight is also up (231.5) from baseline (235-238). Will increase Torsemide to 50 mg daily x 3 days, then patient is to resume home dose.   Patient to follow up next week.

## 2013-03-23 NOTE — Assessment & Plan Note (Signed)
Advised sleep hygiene.  Rx sent for PRN QHS Trazadone.

## 2013-03-25 ENCOUNTER — Telehealth: Payer: Self-pay | Admitting: Family Medicine

## 2013-03-25 NOTE — Telephone Encounter (Signed)
Pt is asking that we re-fax the Oxygen order again to [facility] [phone], She is also asking that Dr. Adriana Simas order her a scale to weigh her self daily. If Dr. Adriana Simas writes the order her insurance will cover the cost. JW

## 2013-03-26 NOTE — Telephone Encounter (Signed)
Will fwd to MD for review.  Ski Polich L, CMA  

## 2013-03-30 ENCOUNTER — Encounter: Payer: Self-pay | Admitting: Family Medicine

## 2013-03-30 ENCOUNTER — Ambulatory Visit (INDEPENDENT_AMBULATORY_CARE_PROVIDER_SITE_OTHER): Payer: Medicare HMO | Admitting: Family Medicine

## 2013-03-30 ENCOUNTER — Telehealth (HOSPITAL_COMMUNITY): Payer: Self-pay | Admitting: Cardiac Rehabilitation

## 2013-03-30 VITALS — BP 123/65 | HR 80 | Ht 62.0 in | Wt 233.0 lb

## 2013-03-30 DIAGNOSIS — I509 Heart failure, unspecified: Secondary | ICD-10-CM

## 2013-03-30 DIAGNOSIS — I5032 Chronic diastolic (congestive) heart failure: Secondary | ICD-10-CM

## 2013-03-30 NOTE — Assessment & Plan Note (Signed)
Patient doing well. Will resume Torsemide 40 mg BID. Patient to follow up in 3 months or earlier if increasing edema/SOB.

## 2013-03-30 NOTE — Progress Notes (Signed)
Subjective:     Patient ID: Anita [name], female   DOB: [birth year], 67 y.o.   MRN: [id number]  HPI Anita [name] presents for follow up regarding CHF today.  1) CHF - Patient seen on 7/28 with worsening LE edema and bilateral rales on examination; Torsemide increased. - Patient reports that she is doing well.  No increase in SOB.  She reports edema is improved.  - Endorses compliance with torsemide as well as all other medications.  Review of Systems See HPI     Objective:   Physical Exam Filed Vitals:   03/30/13 1512  BP: 123/65  Pulse: 80   Exam: General: well appearing, NAD. Speaking in full sentences. Cardiovascular: RRR. No murmurs, rubs, or gallops. Respiratory: Bibasilar rales  Abdomen: soft, nontender, nondistended. Extremities: 1+ LE edema.      Assessment:     See problem list Plan:

## 2013-03-30 NOTE — Patient Instructions (Addendum)
It was good to see you today.  Resume your normal torsemide dose (40 mg daily).  I will put in an order for your oxygen and scale.   Follow up with me in ~ 3 months or earlier if you have worsening edema or SOB

## 2013-03-31 ENCOUNTER — Telehealth: Payer: Self-pay | Admitting: *Deleted

## 2013-03-31 ENCOUNTER — Telehealth: Payer: Self-pay | Admitting: Family Medicine

## 2013-03-31 LAB — BASIC METABOLIC PANEL
BUN: 38 mg/dL — ABNORMAL HIGH (ref 6–23)
Calcium: 9.7 mg/dL (ref 8.4–10.5)
Creat: 1.25 mg/dL — ABNORMAL HIGH (ref 0.50–1.10)

## 2013-03-31 NOTE — Telephone Encounter (Signed)
Left message on patient's voicemail.Anita [name] S  

## 2013-03-31 NOTE — Telephone Encounter (Signed)
Message copied by Tanna Savoy on Tue Mar 31, 2013  3:10 PM ------      [name]      Created: Tue Mar 31, 2013  1:41 PM       Please call patient and let her know she can pick up her weight scale at [facility] clinic (per Dennison Nancy)            Thanks            Dorie Rank ------

## 2013-03-31 NOTE — Telephone Encounter (Signed)
[facility] of [facility] fax#[phone]. Dairon Procter, Virgel Bouquet

## 2013-03-31 NOTE — Telephone Encounter (Signed)
Pt called with the fax number for [facility] is [phone]. She would like Dr. Adriana Simas to fax the order for her oxygen again because they are having computer problems and never received the first order. JW

## 2013-04-03 ENCOUNTER — Telehealth: Payer: Self-pay | Admitting: Family Medicine

## 2013-04-03 NOTE — Telephone Encounter (Signed)
Pt calls the after hours line to ask if her Fluid pill would cause her to bleed from her Vagina. She reports she has had extra swelling in her hands, and she took an extra fluid pill (as Dr. Adriana Simas told her to do). She reports she was talking on the phone at that time and stood up with "blood puddles" under her. She states it was all the way down her legs, with some small clots. She reports no pain with this bleeding. She denies dizziness or shortness of breath. She states this happened to her before and she was suppose to get surgery, but she is not going to let someone "cut on her." She has a h/o chronic anemia and  heme occult positive with EGD/Colonscopy not impressive except for nonspecific descending colon ulcerations Metastatic breast cancer (ER pos/PR pos/ HER2 neg) with spread to bone and cervical lesions with CIN3. Patient feels certain this bleeding is coming from her vagina. I recommended, with her extensive history that she should be evaluated. She seemed hesitant that we would perform surgery on her and stated she would not let "anyone cut on her." I again recommended she come in to be evaluated for her bleeding. She stated understanding.  Felix Pacini DO PGY-2

## 2013-04-04 ENCOUNTER — Inpatient Hospital Stay (HOSPITAL_COMMUNITY)
Admission: AD | Admit: 2013-04-04 | Discharge: 2013-04-04 | Disposition: A | Discharge status: Home / Self Care | Payer: Medicare HMO | Source: Ambulatory Visit | Attending: Obstetrics and Gynecology | Admitting: Obstetrics and Gynecology

## 2013-04-04 ENCOUNTER — Encounter (HOSPITAL_COMMUNITY): Payer: Self-pay | Admitting: *Deleted

## 2013-04-04 DIAGNOSIS — J449 Chronic obstructive pulmonary disease, unspecified: Secondary | ICD-10-CM | POA: Insufficient documentation

## 2013-04-04 DIAGNOSIS — L02415 Cutaneous abscess of right lower limb: Secondary | ICD-10-CM

## 2013-04-04 DIAGNOSIS — L02419 Cutaneous abscess of limb, unspecified: Secondary | ICD-10-CM | POA: Insufficient documentation

## 2013-04-04 DIAGNOSIS — Z853 Personal history of malignant neoplasm of breast: Secondary | ICD-10-CM | POA: Insufficient documentation

## 2013-04-04 DIAGNOSIS — D069 Carcinoma in situ of cervix, unspecified: Secondary | ICD-10-CM | POA: Insufficient documentation

## 2013-04-04 DIAGNOSIS — J4489 Other specified chronic obstructive pulmonary disease: Secondary | ICD-10-CM | POA: Insufficient documentation

## 2013-04-04 DIAGNOSIS — D259 Leiomyoma of uterus, unspecified: Secondary | ICD-10-CM | POA: Insufficient documentation

## 2013-04-04 DIAGNOSIS — L03119 Cellulitis of unspecified part of limb: Secondary | ICD-10-CM | POA: Insufficient documentation

## 2013-04-04 LAB — WET PREP, GENITAL

## 2013-04-04 LAB — CBC
Hemoglobin: 10.3 g/dL — ABNORMAL LOW (ref 12.0–15.0)
MCH: 24 pg — ABNORMAL LOW (ref 26.0–34.0)
MCHC: 32.5 g/dL (ref 30.0–36.0)
Platelets: 287 10*3/uL (ref 150–400)
RBC: 4.3 MIL/uL (ref 3.87–5.11)

## 2013-04-04 LAB — COMPREHENSIVE METABOLIC PANEL
ALT: 16 U/L (ref 0–35)
AST: 12 U/L (ref 0–37)
Alkaline Phosphatase: 116 U/L (ref 39–117)
CO2: 27 mEq/L (ref 19–32)
Calcium: 9.8 mg/dL (ref 8.4–10.5)
GFR calc Af Amer: 52 mL/min — ABNORMAL LOW (ref >90)
Glucose, Bld: 191 mg/dL — ABNORMAL HIGH (ref 70–99)
Potassium: 3.1 mEq/L — ABNORMAL LOW (ref 3.5–5.1)
Sodium: 137 mEq/L (ref 135–145)
Total Protein: 6.6 g/dL (ref 6.0–8.3)

## 2013-04-04 MED ORDER — SULFAMETHOXAZOLE-TRIMETHOPRIM 400-80 MG PO TABS
1.0000 | ORAL_TABLET | Freq: Once | ORAL | Status: DC
Start: 2013-04-04 — End: 2013-04-04
  Filled 2013-04-04: qty 1

## 2013-04-04 MED ORDER — SULFAMETHOXAZOLE-TMP DS 800-160 MG PO TABS: 1.0000 | ORAL_TABLET | Freq: Two times a day (BID) | ORAL | Status: DC

## 2013-04-04 MED ORDER — SULFAMETHOXAZOLE-TMP DS 800-160 MG PO TABS
1.0000 | ORAL_TABLET | Freq: Once | ORAL | Status: AC
  Administered 2013-04-04: 1 via ORAL
  Filled 2013-04-04: qty 1

## 2013-04-04 NOTE — MAU Note (Signed)
VAginal bleeding since 2300. No pain

## 2013-04-04 NOTE — Progress Notes (Signed)
No blood in vagina. Sore area R inner thigh slowly oozing which is probable cause of bleeding pt saw

## 2013-04-04 NOTE — MAU Provider Note (Signed)
History     CSN: [id number]  Arrival date and time: 04/04/13 0021   First Provider Initiated Contact with Patient 04/04/13 0123      Chief Complaint  Patient presents with  . Vaginal Bleeding   Vaginal Bleeding    Anita [name] is a 67 y.o. who presents today with what she feels is vaginal bleeding. She states that she was on the phone and noticed that she had blood between her legs. She had an episode of vaginal bleeding in October that she had packed and it stopped the bleeding. She was later diagnosed with metastatic breast cancer. She also had a NSTEMI in July and did not have a cath done. She denies any pain at this time. She also has a hx of MRSA.   Past Medical History  Diagnosis Date  . Fibroid   . Morbid obesity   . 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 COPD (chronic obstructive pulmonary disease)   . Diastolic heart failure   . NSTEMI (non-ST elevated myocardial infarction)   . Anemia   . Breast cancer   . Ulcers of both lower legs   . Tobacco abuse   . Hyperlipidemia 02/11/2013    Past Surgical History  Procedure Laterality Date  . Colonoscopy, esophagogastroduodenoscopy (egd) and esophageal dilation N/A 10/13/2012    Procedure: colonoscopy and egd;  Surgeon: Graylin Shiver, MD;  Location: [facility] ENDOSCOPY;  Service: Endoscopy;  Laterality: N/A;    Family History  Problem Relation Age of Onset  . Cancer Mother     leukemia  . Hypertension Mother   . Diabetes Father   . Heart disease Father     passed away due to heart attack    History  Substance Use Topics  . Smoking status: Former Smoker -- 2.00 packs/day for 40 years    Types: Cigarettes    Quit date: 09/24/2012  . Smokeless tobacco: Never Used  . Alcohol Use: No    Allergies:  Allergies  Allergen Reactions  . Omnipaque (Iohexol)     Pt claims she developed hives after given contrast  . Benzene Rash    Prescriptions prior to admission  Medication Sig  Dispense Refill  . aspirin EC 81 MG EC tablet Take 1 tablet (81 mg total) by mouth daily.  30 tablet  3  . atorvastatin (LIPITOR) 40 MG tablet Take 1 tablet (40 mg total) by mouth daily at 6 PM.  30 tablet  5  . Calcium Citrate (CALCITRATE PO) Take 1 tablet by mouth daily.       [name] docusate sodium (COLACE) 100 MG capsule Take 1 capsule (100 mg total) by mouth 2 (two) times daily as needed for constipation.  60 capsule  3  . isosorbide mononitrate (IMDUR) 30 MG 24 hr tablet Take 1 tablet (30 mg total) by mouth daily.  30 tablet  0  . letrozole (FEMARA) 2.5 MG tablet Take 1 tablet (2.5 mg total) by mouth daily.  30 tablet  3  . polyethylene glycol powder (GLYCOLAX/MIRALAX) powder Take 17 g by mouth daily as needed (Constipation).  3350 g  1  . Potassium Chloride (KLOR-CON PO) Take 20 mEq by mouth daily.       [name] tiotropium (SPIRIVA HANDIHALER) 18 MCG inhalation capsule Place 1 capsule (18 mcg total) into inhaler and inhale daily.  30 capsule  6  . torsemide (DEMADEX) 10 MG tablet Take 1 tablet (10 mg  total) by mouth as needed (for 2 pound weight gain per day).  30 tablet  0  . torsemide (DEMADEX) 20 MG tablet Take 2 tablets (40 mg total) by mouth daily.  60 tablet  0  . albuterol (PROVENTIL HFA;VENTOLIN HFA) 108 (90 BASE) MCG/ACT inhaler Inhale 1 puff into the lungs 2 (two) times daily as needed for wheezing or shortness of breath.      . nitroGLYCERIN (NITROSTAT) 0.4 MG SL tablet Place 1 tablet (0.4 mg total) under the tongue every 5 (five) minutes as needed for chest pain.  30 tablet  0  . traZODone (DESYREL) 50 MG tablet Take 0.5-1 tablets (25-50 mg total) by mouth at bedtime as needed for sleep.  30 tablet  2    Review of Systems  Genitourinary: Positive for vaginal bleeding.   Physical Exam   Blood pressure 145/77, pulse 107, temperature 98 F (36.7 C), resp. rate 22, height 5\' 2"  (1.575 m), SpO2 94.00%.  Physical Exam  Nursing note and vitals reviewed. Constitutional: She is oriented to  person, place, and time. She appears well-developed and well-nourished.  Cardiovascular: Normal rate.   Respiratory: Effort normal.  GI: Soft. There is no tenderness.  Genitourinary:   External: bleeding abscess on the right thigh Vagina: small amount of white discharge. No blood seen in the vagina.  Cervix: pink, slightly irregular in shape.  Uterus and adnexa: difficult to assess 2/2 body habitus.     Neurological: She is alert and oriented to person, place, and time.  Skin: Skin is warm and dry.  Psychiatric: She has a normal mood and affect.    MAU Course  Procedures  Results for orders placed during the hospital encounter of 04/04/13 (from the past 24 hour(s))  CBC     Status: Abnormal   Collection Time    04/04/13 12:56 AM      Result Value Range   WBC 7.3  4.0 - 10.5 K/uL   RBC 4.30  3.87 - 5.11 MIL/uL   Hemoglobin 10.3 (*) 12.0 - 15.0 g/dL   HCT 40.9 (*) 81.1 - 91.4 %   MCV 73.7 (*) 78.0 - 100.0 fL   MCH 24.0 (*) 26.0 - 34.0 pg   MCHC 32.5  30.0 - 36.0 g/dL   RDW 78.2 (*) 95.6 - 21.3 %   Platelets 287  150 - 400 K/uL   0150: Spoke with Dr. Emelda Fear, agrees with plan to dc home with bactrim for bleeding/drain abscess and hx of MRSA.   Assessment and Plan   1. Abscess of right thigh    RX: Bactrim DS BID X 10 FU with [facility] and oncologist as needed   Tawnya Crook 04/04/2013, 1:36 AM

## 2013-04-04 NOTE — Progress Notes (Signed)
Written and verbal d/c instructions given and understanding voiced. 

## 2013-04-05 NOTE — MAU Provider Note (Signed)
Attestation of Attending Supervision of Advanced Practitioner: Evaluation and management procedures were performed by the PA/NP/CNM/OB Fellow under my supervision/collaboration. Chart reviewed and agree with management and plan.  Arafat Cocuzza V 04/05/2013 11:58 AM

## 2013-04-06 ENCOUNTER — Telehealth: Payer: Self-pay | Admitting: Family Medicine

## 2013-04-06 NOTE — Telephone Encounter (Signed)
Received an in basket message from [employer] from [facility].  They received an order for a portable O2 tank but patient now has [employer] Medicare and [facility] is not contracted with [employer] for medical equipment.  We will need to transition Anita [name] O2 needs to [employer].  [facility] has notified Anita [name] of this.  Orders faxed to [employer] at [phone].  Ileana Ladd

## 2013-04-06 NOTE — Telephone Encounter (Signed)
Pt was in the ED this weekend and wanted Dr. Adriana Simas to look at her medical records from her visit and see if he needed to see her for a F/U. If not that's fine, but if let her know so she can schedule an appointment. JW

## 2013-04-09 ENCOUNTER — Ambulatory Visit: Payer: Self-pay

## 2013-04-09 ENCOUNTER — Telehealth: Payer: Self-pay | Admitting: Family Medicine

## 2013-04-09 NOTE — Telephone Encounter (Signed)
Please see note below. Elizabeth Julieta Rogalski, RN-BSN  

## 2013-04-09 NOTE — Telephone Encounter (Signed)
Pt called because the orders that were faxed to [employer] for her oxygen was turned down because they Dr. Patsey Berthold signature and the date on the orders. They would like him to sign and date it and fax it back today so she can get her oxygen. JW

## 2013-04-10 ENCOUNTER — Telehealth: Payer: Self-pay | Admitting: Cardiology

## 2013-04-10 NOTE — Telephone Encounter (Signed)
Follow Up     Cardiac rehab faxed over a form this morning. Pt is calling following up on Dr. Lindaann Slough signature.

## 2013-04-11 ENCOUNTER — Telehealth: Payer: Self-pay | Admitting: Family Medicine

## 2013-04-11 NOTE — Telephone Encounter (Signed)
Pt called emergency line stating that her leg was swelling and she has had increased SOB today and ?yesterday. She took extra Torsemide today per Dr. Patsey Berthold previous instructions (normally is on 40 mg daily, and she took 10 mg extra) about 30 minutes ago but hasn't started urinating yet. Explained to pt that the medication may take longer than that to work. Pt stated she does not feel yet like she needs to be checked at urgent care or the ED and is using her home oxygen. Advised pt to let the torsemide work tonight, and to consider taking an extra dose tomorrow as well if she still has increased leg swelling. if she continues to have worsening SOB, leg swelling, difficulty breathing, etc, that she should come to the ED. Otherwise, advised pt to call the clinic on Monday to schedule a close follow-up appointment to be evaluated.  Stephanie Coup Street, MD 04/11/2013, 5:50 PM

## 2013-04-12 ENCOUNTER — Telehealth: Payer: Self-pay | Admitting: Emergency Medicine

## 2013-04-12 NOTE — Telephone Encounter (Signed)
Emergency Line Call Patient called the emergency line regarding lower extremity swelling.  She reports that her left ankle has is swollen today.  She states that typically the left ankle swells first.  Denies any pain in the left leg.  She weighed herself and her weight is stable at 232lb per her report.  She has grade 1 diastolic dysfunction on echo in 02/2013.  She denies any increased shortness of breath (on home O2 for COPD) or orthopnea.  She states she normally takes torsemide 40mg  daily, but took 50mg  today.  Has not noticed an increase in urination.  She wants to makes sure that it is okay to take the extra torsemide as she does not want to go to the emergency room.  I instructed her to take 60mg  of torsemide Monday and Tuesday.  She should continue to weigh herself daily.  She needs to schedule an appointment at the office for Tuesday or Wednesday.  Reviewed reasons to go the emergency room including worsening edema, weight gain, and trouble breathing.  She expressed understanding and agreement with this plan.  Also, she mentioned a problem with getting her portable oxygen and her PCP needs to resend the prescription to [place].  BOOTH, Jesiah Grismer 04/12/2013, 8:55 PM

## 2013-04-13 ENCOUNTER — Telehealth: Payer: Self-pay | Admitting: Cardiology

## 2013-04-13 MED ORDER — ISOSORBIDE MONONITRATE ER 30 MG PO TB24: 30.0000 mg | ORAL_TABLET | Freq: Every day | ORAL | Status: DC

## 2013-04-13 NOTE — Telephone Encounter (Signed)
msg left, I don't see where med has been dc'd, refill completed.

## 2013-04-13 NOTE — Telephone Encounter (Signed)
Pt needs refill of IMDUR, if she is to keep taking, if so needs refill at [facility], pls call [phone], ok to leave message

## 2013-04-14 NOTE — Telephone Encounter (Signed)
There was no form in my box as of 04/13/13.

## 2013-04-14 NOTE — Telephone Encounter (Signed)
Paperwork has been obtained and has been placed on desk in dictation area for Dr Antoine Poche to sign

## 2013-04-15 ENCOUNTER — Other Ambulatory Visit: Payer: Self-pay | Admitting: Oncology

## 2013-04-15 DIAGNOSIS — C7951 Secondary malignant neoplasm of bone: Secondary | ICD-10-CM

## 2013-04-15 DIAGNOSIS — C50919 Malignant neoplasm of unspecified site of unspecified female breast: Secondary | ICD-10-CM

## 2013-04-16 NOTE — Telephone Encounter (Signed)
Paperwork was completed and given to MR Tuesday to be faxed to rehab.

## 2013-04-30 ENCOUNTER — Inpatient Hospital Stay (HOSPITAL_COMMUNITY): Admission: RE | Admit: 2013-04-30 | Payer: Medicare HMO | Source: Ambulatory Visit

## 2013-04-30 ENCOUNTER — Telehealth: Payer: Self-pay | Admitting: Cardiology

## 2013-04-30 NOTE — Telephone Encounter (Signed)
Routed to Hochrein's nurse,Pam Meredeth Ide RN to follow-up

## 2013-04-30 NOTE — Telephone Encounter (Signed)
New Problem  Cardic rehab/// needs clearance//

## 2013-05-01 ENCOUNTER — Telehealth: Payer: Self-pay | Admitting: Cardiology

## 2013-05-01 ENCOUNTER — Telehealth: Payer: Self-pay | Admitting: *Deleted

## 2013-05-01 NOTE — Telephone Encounter (Signed)
Pt left VM stating her left shoulder has been "killing me" and she worries it might be cancer in her shoulder.  Called pt back and left VM we will have MD review her case and call her back on Monday 9/08.  Instructed her to go to ED if pain is really severe.   Message given to Dr. Benjiman Core nurse.  Pt has appt w/ him in 2 weeks on 9/22.

## 2013-05-01 NOTE — Telephone Encounter (Signed)
Encounter closed by accident [facility] has paperwork to be completed and will be faxed once it has been completed.

## 2013-05-01 NOTE — Telephone Encounter (Signed)
Pt calling back re message for cardiac rehab, needs clearance

## 2013-05-04 ENCOUNTER — Other Ambulatory Visit: Payer: Self-pay | Admitting: Oncology

## 2013-05-04 ENCOUNTER — Ambulatory Visit
Admission: RE | Admit: 2013-05-04 | Discharge: 2013-05-04 | Disposition: A | Discharge status: Home / Self Care | Payer: Commercial Managed Care - HMO | Source: Ambulatory Visit | Attending: Oncology | Admitting: Oncology

## 2013-05-04 DIAGNOSIS — C50919 Malignant neoplasm of unspecified site of unspecified female breast: Secondary | ICD-10-CM

## 2013-05-06 ENCOUNTER — Ambulatory Visit (HOSPITAL_COMMUNITY): Payer: Medicare HMO

## 2013-05-06 ENCOUNTER — Other Ambulatory Visit: Payer: Self-pay | Admitting: Family Medicine

## 2013-05-08 ENCOUNTER — Other Ambulatory Visit: Payer: Self-pay | Admitting: *Deleted

## 2013-05-08 ENCOUNTER — Ambulatory Visit (HOSPITAL_COMMUNITY): Payer: Medicare HMO

## 2013-05-08 ENCOUNTER — Telehealth: Payer: Self-pay | Admitting: *Deleted

## 2013-05-08 NOTE — Progress Notes (Signed)
Was informed by Bonita Quin in pre-cert department that pt's insurance has not approved pt's upcoming CT scans  ordered for 05/11/13.   CT scan will need to be rescheduled for a later date awaiting response from the insurance.  CT scan will be rescheduled for 05/13/13 ; pt has f/u appt with Dr. Rosie Fate on 05/18/13. Spoke with pt at home; pt stated radiology had contacted pt about the cancellation of CT scans on 05/11/13. Onc tx schedule sent to scheduler for new date and time of CT scans.

## 2013-05-08 NOTE — Telephone Encounter (Signed)
Pt called to ask why her CT scan has to be r/s from Monday to Wednesday next week?  Informed pt it has not been approved yet by her insurance,  But we are working on it.  She also asks how she should take her prednisone?  Says she is allergic to CT dye and has to take prednisone prior to exam.   Informed pt will have Dr. Benjiman Core nurse call her back on Monday about the prednisone since it is after 5 pm on Friday.  She verbalized understanding.

## 2013-05-11 ENCOUNTER — Telehealth: Payer: Self-pay

## 2013-05-11 ENCOUNTER — Telehealth: Payer: Self-pay | Admitting: *Deleted

## 2013-05-11 ENCOUNTER — Other Ambulatory Visit: Payer: Medicaid Other | Admitting: Lab

## 2013-05-11 ENCOUNTER — Telehealth: Payer: Self-pay | Admitting: Family Medicine

## 2013-05-11 ENCOUNTER — Ambulatory Visit (HOSPITAL_COMMUNITY): Admission: RE | Admit: 2013-05-11 | Payer: Medicare HMO | Source: Ambulatory Visit

## 2013-05-11 ENCOUNTER — Other Ambulatory Visit: Payer: Self-pay

## 2013-05-11 NOTE — Telephone Encounter (Signed)
Pt called stating she had her premeds for her CT. She reiterated 3pm she comes to [facility] and then to CT.

## 2013-05-11 NOTE — Telephone Encounter (Signed)
lvm for pt to call Olegario Messier, dr chism's nurse. i am making sure she has her premeds for CT on Wed because of her allergy. Her CT has been pre- authorized.

## 2013-05-11 NOTE — Telephone Encounter (Signed)
lvm for pt to call and let us know if she got her premeds for her CT d/t her cm allergy.

## 2013-05-11 NOTE — Telephone Encounter (Signed)
Patient calls to let Dr. Adriana Simas know that she is having SOB and hoarseness. Also, she has gain 3-4 lbs. Patient made an appt to see Dr. Adriana Simas on 9/26 @ 3pm.

## 2013-05-12 ENCOUNTER — Telehealth: Payer: Self-pay | Admitting: *Deleted

## 2013-05-12 ENCOUNTER — Other Ambulatory Visit: Payer: Self-pay

## 2013-05-12 DIAGNOSIS — C50919 Malignant neoplasm of unspecified site of unspecified female breast: Secondary | ICD-10-CM

## 2013-05-12 NOTE — Telephone Encounter (Signed)
Patient should notify cardiologist.  Please get her in to be seen as soon as possible this week preferably tomorrow.

## 2013-05-12 NOTE — Telephone Encounter (Signed)
Lm gv appt for labs on 05/13/13 @ 2:30pm...td

## 2013-05-13 ENCOUNTER — Encounter (HOSPITAL_COMMUNITY): Payer: Self-pay

## 2013-05-13 ENCOUNTER — Other Ambulatory Visit (HOSPITAL_BASED_OUTPATIENT_CLINIC_OR_DEPARTMENT_OTHER): Payer: Medicare HMO | Admitting: Lab

## 2013-05-13 ENCOUNTER — Ambulatory Visit (HOSPITAL_COMMUNITY)
Admission: RE | Admit: 2013-05-13 | Discharge: 2013-05-13 | Disposition: A | Discharge status: Home / Self Care | Payer: Medicare HMO | Source: Ambulatory Visit | Attending: Oncology | Admitting: Oncology

## 2013-05-13 ENCOUNTER — Ambulatory Visit (HOSPITAL_COMMUNITY): Payer: Medicare HMO

## 2013-05-13 DIAGNOSIS — R918 Other nonspecific abnormal finding of lung field: Secondary | ICD-10-CM | POA: Insufficient documentation

## 2013-05-13 DIAGNOSIS — D638 Anemia in other chronic diseases classified elsewhere: Secondary | ICD-10-CM

## 2013-05-13 DIAGNOSIS — I700 Atherosclerosis of aorta: Secondary | ICD-10-CM | POA: Insufficient documentation

## 2013-05-13 DIAGNOSIS — C7951 Secondary malignant neoplasm of bone: Secondary | ICD-10-CM | POA: Insufficient documentation

## 2013-05-13 DIAGNOSIS — C50919 Malignant neoplasm of unspecified site of unspecified female breast: Secondary | ICD-10-CM

## 2013-05-13 DIAGNOSIS — N281 Cyst of kidney, acquired: Secondary | ICD-10-CM | POA: Insufficient documentation

## 2013-05-13 DIAGNOSIS — C50911 Malignant neoplasm of unspecified site of right female breast: Secondary | ICD-10-CM

## 2013-05-13 HISTORY — DX: Type 2 diabetes mellitus without complications: E11.9

## 2013-05-13 LAB — CBC WITH DIFFERENTIAL/PLATELET
BASO%: 0.1 % (ref 0.0–2.0)
Basophils Absolute: 0 10*3/uL (ref 0.0–0.1)
EOS%: 0 % (ref 0.0–7.0)
HGB: 11 g/dL — ABNORMAL LOW (ref 11.6–15.9)
MCH: 23.7 pg — ABNORMAL LOW (ref 25.1–34.0)
MCHC: 32.5 g/dL (ref 31.5–36.0)
MCV: 72.9 fL — ABNORMAL LOW (ref 79.5–101.0)
MONO%: 1.1 % (ref 0.0–14.0)
RBC: 4.64 10*6/uL (ref 3.70–5.45)
RDW: 20.8 % — ABNORMAL HIGH (ref 11.2–14.5)
lymph#: 0.8 10*3/uL — ABNORMAL LOW (ref 0.9–3.3)

## 2013-05-13 LAB — COMPREHENSIVE METABOLIC PANEL (CC13)
ALT: 23 U/L (ref 0–55)
AST: 13 U/L (ref 5–34)
Albumin: 3.6 g/dL (ref 3.5–5.0)
CO2: 27 mEq/L (ref 22–29)
Calcium: 10.1 mg/dL (ref 8.4–10.4)
Chloride: 99 mEq/L (ref 98–109)
Creatinine: 1.2 mg/dL — ABNORMAL HIGH (ref 0.6–1.1)
Potassium: 3.4 mEq/L — ABNORMAL LOW (ref 3.5–5.1)
Total Protein: 7.1 g/dL (ref 6.4–8.3)

## 2013-05-13 MED ORDER — IOHEXOL 300 MG/ML  SOLN
100.0000 mL | Freq: Once | INTRAMUSCULAR | Status: AC | PRN
  Administered 2013-05-13: 100 mL via INTRAVENOUS

## 2013-05-14 ENCOUNTER — Ambulatory Visit: Payer: Medicaid Other | Admitting: Oncology

## 2013-05-14 LAB — CANCER ANTIGEN 27.29: CA 27.29: 19 U/mL (ref 0–39)

## 2013-05-15 ENCOUNTER — Ambulatory Visit (HOSPITAL_COMMUNITY): Payer: Medicare HMO

## 2013-05-18 ENCOUNTER — Encounter: Payer: Self-pay | Admitting: Internal Medicine

## 2013-05-18 ENCOUNTER — Ambulatory Visit (HOSPITAL_BASED_OUTPATIENT_CLINIC_OR_DEPARTMENT_OTHER): Payer: Medicare HMO | Admitting: Internal Medicine

## 2013-05-18 VITALS — BP 131/75 | HR 111 | Temp 99.4°F | Resp 22 | Ht 62.0 in | Wt 233.4 lb

## 2013-05-18 DIAGNOSIS — C50919 Malignant neoplasm of unspecified site of unspecified female breast: Secondary | ICD-10-CM

## 2013-05-18 DIAGNOSIS — C50911 Malignant neoplasm of unspecified site of right female breast: Secondary | ICD-10-CM

## 2013-05-18 DIAGNOSIS — C7951 Secondary malignant neoplasm of bone: Secondary | ICD-10-CM

## 2013-05-18 DIAGNOSIS — N6310 Unspecified lump in the right breast, unspecified quadrant: Secondary | ICD-10-CM

## 2013-05-18 DIAGNOSIS — D638 Anemia in other chronic diseases classified elsewhere: Secondary | ICD-10-CM

## 2013-05-18 MED ORDER — LETROZOLE 2.5 MG PO TABS: 2.5000 mg | ORAL_TABLET | Freq: Every day | ORAL | Status: DC

## 2013-05-18 NOTE — Progress Notes (Signed)
[facility] OFFICE PROGRESS NOTE  Anita Other, DO [address] 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 ER 100%/ PR 0%/Her2 neu negative  CURRENT THERAPY:  Palliative letrozole in 11/2012. Biopsy only.   INTERVAL HISTORY: Anita [name] 67 y.o. female with a history of right breast cancer with metastases to the bone/lungs here for 3 month followup.  She was last seen by Dr. Gaylyn Rong on 02/11/2013.  She reports tolerating letrozole well without hot flash/night sweat, bone/muscle pain. She has not noticed any more back pain compared to before therapy. She still has DOE from past COPD. She is no inhaler. However, she needs portable O2 when she ambulates since she cannot ambulate very far on room air. She has had an interim visit CT of Chest/abdomen and pelvis and a mammogram. She denies recent hospitalizations or emergency room visits.   MEDICAL HISTORY: Past Medical History  Diagnosis Date  . Fibroid   . Morbid obesity   . 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 COPD (chronic obstructive pulmonary disease)   . Diastolic heart failure   . NSTEMI (non-ST elevated myocardial infarction)   . Anemia   . Breast cancer   . Ulcers of both lower legs   . Tobacco abuse   . Hyperlipidemia 02/11/2013  . Diabetes mellitus without complication     INTERIM HISTORY: has DM2 (diabetes mellitus, type 2); COPD (chronic obstructive pulmonary disease); Chronic respiratory failure; Chronic diastolic congestive heart failure; CAD (coronary artery disease); Chronic kidney disease; Anemia of chronic disease; Breast mass, right; History of tobacco abuse; Breast cancer metastasized to bone; Hyperlipidemia; Constipation; and Insomnia on her problem list.    ALLERGIES:  is allergic to omnipaque and benzene.  MEDICATIONS: has a current medication list which includes the following prescription(s): albuterol, aspirin,  atorvastatin, calcium citrate, docusate sodium, isosorbide mononitrate, letrozole, nitroglycerin, polyethylene glycol powder, potassium chloride, tiotropium, torsemide, and torsemide.  SURGICAL HISTORY:  Past Surgical History  Procedure Laterality Date  . Colonoscopy, esophagogastroduodenoscopy (egd) and esophageal dilation N/A 10/13/2012    Procedure: colonoscopy and egd;  Surgeon: Graylin Shiver, MD;  Location: [facility] ENDOSCOPY;  Service: Endoscopy;  Laterality: N/A;    REVIEW OF SYSTEMS:   Constitutional: Denies fevers, chills or abnormal weight loss Eyes: Denies blurriness of vision Ears, nose, mouth, throat, and face: Denies mucositis or sore throat Respiratory: Denies cough, dyspnea or wheezes Cardiovascular: Denies palpitation, chest discomfort or lower extremity swelling Gastrointestinal:  Denies nausea, heartburn or change in bowel habits Skin: Denies abnormal skin rashes Lymphatics: Denies new lymphadenopathy or easy bruising Neurological:Denies numbness, tingling or new weaknesses Behavioral/Psych: Mood is stable, no new changes  All [name] systems were reviewed with the patient and are negative.  PHYSICAL EXAMINATION: ECOG PERFORMANCE STATUS: 1 - Symptomatic but completely ambulatory  Blood pressure 131/75, pulse 111, temperature 99.4 F (37.4 C), temperature source Oral, resp. rate 22, height 5\' 2"  (1.575 m), weight 233 lb 6.4 oz (105.87 kg), SpO2 93.00%.  GENERAL:alert, no distress and comfortable morbidly obese woman SKIN: skin color, texture, turgor are normal, no rashes or significant lesions EYES: normal, Conjunctiva are pink and non-injected, sclera clear OROPHARYNX:no exudate, no erythema and lips, buccal mucosa, and tongue normal  NECK: supple, thyroid normal size, non-tender, without nodularity LYMPH:  no palpable lymphadenopathy in the cervical, axillary LUNGS: coarse breathing with slightly increased breathing rate HEART: regular rate & rhythm and no murmurs  and no  lower extremity edema BREAST: Right breast exam showed a right lower outer quadrant breast mass about 5cm without axillary adenopathy.  ABDOMEN:abdomen soft, non-tender and normal bowel sounds; obese Musculoskeletal:no cyanosis of digits and no clubbing  NEURO: alert & oriented x 3 with fluent speech, no focal motor/sensory deficits   LABORATORY DATA: CBC    Component Value Date/Time   WBC 12.4* 05/13/2013 1415   WBC 7.3 04/04/2013 0056   RBC 4.64 05/13/2013 1415   RBC 4.30 04/04/2013 0056   RBC 3.18* 09/26/2012 0518   HGB 11.0* 05/13/2013 1415   HGB 10.3* 04/04/2013 0056   HCT 33.8* 05/13/2013 1415   HCT 31.7* 04/04/2013 0056   PLT 376 05/13/2013 1415   PLT 287 04/04/2013 0056   MCV 72.9* 05/13/2013 1415   MCV 73.7* 04/04/2013 0056   MCH 23.7* 05/13/2013 1415   MCH 24.0* 04/04/2013 0056   MCHC 32.5 05/13/2013 1415   MCHC 32.5 04/04/2013 0056   RDW 20.8* 05/13/2013 1415   RDW 19.4* 04/04/2013 0056   LYMPHSABS 0.8* 05/13/2013 1415   LYMPHSABS 1.0 10/06/2012 1145   MONOABS 0.1 05/13/2013 1415   MONOABS 0.8 10/06/2012 1145   EOSABS 0.0 05/13/2013 1415   EOSABS 0.1 10/06/2012 1145   BASOSABS 0.0 05/13/2013 1415   BASOSABS 0.0 10/06/2012 1145   CMP     Component Value Date/Time   NA 139 05/13/2013 1414   NA 137 04/04/2013 0056   K 3.4* 05/13/2013 1414   K 3.1* 04/04/2013 0056   CL 97 04/04/2013 0056   CL 101 02/11/2013 0950   CO2 27 05/13/2013 1414   CO2 27 04/04/2013 0056   GLUCOSE 225* 05/13/2013 1414   GLUCOSE 191* 04/04/2013 0056   GLUCOSE 143* 02/11/2013 0950   BUN 31.8* 05/13/2013 1414   BUN 35* 04/04/2013 0056   CREATININE 1.2* 05/13/2013 1414   CREATININE 1.24* 04/04/2013 0056   CREATININE 1.25* 03/30/2013 1558   CALCIUM 10.1 05/13/2013 1414   CALCIUM 9.8 04/04/2013 0056   CALCIUM 6.6* 09/29/2012 1118   PROT 7.1 05/13/2013 1414   PROT 6.6 04/04/2013 0056   ALBUMIN 3.6 05/13/2013 1414   ALBUMIN 3.7 04/04/2013 0056   AST 13 05/13/2013 1414   AST 12 04/04/2013 0056   ALT 23 05/13/2013 1414   ALT 16 04/04/2013 0056   ALKPHOS  111 05/13/2013 1414   ALKPHOS 116 04/04/2013 0056   BILITOT 0.34 05/13/2013 1414   BILITOT 0.2* 04/04/2013 0056   GFRNONAA 45* 04/04/2013 0056   GFRAA 52* 04/04/2013 0056      RADIOGRAPHIC STUDIES: Ct Chest W Contrast  05/13/2013   CLINICAL DATA:  Metastatic right breast cancer, osseous metastases, possible lung metastases  EXAM: CT CHEST, ABDOMEN, AND PELVIS WITH CONTRAST  TECHNIQUE: Multidetector CT imaging of the chest, abdomen and pelvis was performed following the standard protocol during bolus administration of intravenous contrast.  CONTRAST:  OMNIPAQUE IOHEXOL 300 MG/ML  SOLN  COMPARISON:  CTA chest dated 03/13/2013. CT chest abdomen pelvis dated 10/08/2012.  FINDINGS: CT CHEST FINDINGS  Evaluation is constrained by respiratory motion.  Two 4-5 mm patchy/nodular opacities in the lateral right upper lobe (series 5/ image 19) and superior right lower lobe (series 5/image 21), grossly unchanged. Additional 4 mm subpleural nodule in the left upper lobe (series 5/ image 20), difficult to visualize on the priory study but likely unchanged from 10/08/2012.  No new/progressive pulmonary nodules. No pleural effusion or pneumothorax.  Visualized left thyroid is enlarged/nodular.  Mild cardiomegaly.  No pericardial effusion. Atherosclerotic calcifications of the aortic arch.  No suspicious mediastinal, hilar, or axillary lymphadenopathy.  Widespread sclerotic osseous metastases in the visualized axial and appendicular skeleton, unchanged.  CT ABDOMEN AND PELVIS FINDINGS  Motion degraded images.  Liver, pancreas, and adrenal glands are within normal limits.  Spleen is within normal limits. Prior anterior splenic and perisplenic lesions have resolved.  Gallbladder is unremarkable. No intrahepatic or extrahepatic ductal dilatation.  2.3 x 1.8 cm anterior right upper pole renal cyst with suspected enhancing rim (series 4/image 13), although decreased, previously 5.1 x 4.4 cm. 1.5 cm posterior interpolar left renal  cyst (series 4/ image 11). 1.3 cm anterior left lower pole renal cyst (series 4/ image 19). No hydronephrosis.  No evidence bowel obstruction. Normal appendix.  Atherosclerotic calcifications of the abdominal aorta and branch vessels.  No abdominopelvic ascites.  No suspicious abdominopelvic lymphadenopathy.  Uterus is notable for calcified uterine fibroids. Bilateral ovaries are unremarkable.  Bladder is within normal limits.  Widespread sclerotic osseous metastases in the visualized axial and appendicular skeleton, unchanged.  IMPRESSION: CT CHEST IMPRESSION  Motion degraded images.  Scattered small pulmonary nodules measuring 4-5 mm, as described above, unchanged and indeterminate. Continued attention on follow-up is suggested.  Widespread sclerotic osseous metastases, unchanged.  CT ABDOMEN AND PELVIS IMPRESSION  Motion degraded images.  Prior anterior splenic and perisplenic lesions have resolved, of uncertain underlying etiology.  2.3 cm anterior right upper pole renal cyst with suspected enhancing rim, although decreased.  Widespread sclerotic osseous metastases, unchanged.   Electronically Signed   By: Charline Bills M.D.   On: 05/13/2013 16:36   US Breast Right  05/04/2013   *RADIOLOGY REPORT*  Clinical Data:  Right breast cancer metastatic to bone.  The patient is currently taking Letrozole.  Evaluate  for response.  DIGITAL DIAGNOSTIC RIGHT MAMMOGRAM WITH CAD AND RIGHT BREAST ULTRASOUND:  Comparison:  11/07/2012  Findings:  ACR Breast Density Category b:  There are scattered areas of fibroglandular density.  Malignant type calcifications in the right upper outer quadrant posteriorly are again noted.  These cover an area approximately 2.3 x 2.7 x 5.0 cm in size.  The area of diffuse increased density in the upper outer quadrant of the right breast noted on prior study has decreased.  No definitive mass is identified.  Previously noted periareolar skin thickening has nearly completely resolved.   Mammographic images were processed with CAD.  On physical exam, no mass is palpated in the outer portion of the right breast.  Ultrasound is performed, showing clustered masses in the 10 o'clock position right breast, 11 cm from the right nipple likely corresponding to the area of calcifications.  This area measures 3.2 x 1.2 x 2.7 cm.  This is likely residual carcinoma.  No [name] definite abnormality is identified.  If further evaluation of the right breast would be helpful, breast MRI may be helpful to better delineate the extent of residual disease.  IMPRESSION: Interval decrease in size of known carcinoma in the outer portion of the right breast.  RECOMMENDATION: Clinical treatment plan.  I have discussed the findings and recommendations with the patient. Results were also provided in writing at the conclusion of the visit.  If applicable, a reminder letter will be sent to the patient regarding the next appointment.  BI-RADS CATEGORY 6:  Known biopsy-proven malignancy - appropriate action should be taken.   Original Report Authenticated By: Cain Saupe, M.D.   Ct Abdomen Pelvis W Contrast  05/13/2013  CLINICAL DATA:  Metastatic right breast cancer, osseous metastases, possible lung metastases  EXAM: CT CHEST, ABDOMEN, AND PELVIS WITH CONTRAST  TECHNIQUE: Multidetector CT imaging of the chest, abdomen and pelvis was performed following the standard protocol during bolus administration of intravenous contrast.  CONTRAST:  OMNIPAQUE IOHEXOL 300 MG/ML  SOLN  COMPARISON:  CTA chest dated 03/13/2013. CT chest abdomen pelvis dated 10/08/2012.  FINDINGS: CT CHEST FINDINGS  Evaluation is constrained by respiratory motion.  Two 4-5 mm patchy/nodular opacities in the lateral right upper lobe (series 5/ image 19) and superior right lower lobe (series 5/image 21), grossly unchanged. Additional 4 mm subpleural nodule in the left upper lobe (series 5/ image 20), difficult to visualize on the priory study but  likely unchanged from 10/08/2012.  No new/progressive pulmonary nodules. No pleural effusion or pneumothorax.  Visualized left thyroid is enlarged/nodular.  Mild cardiomegaly. No pericardial effusion. Atherosclerotic calcifications of the aortic arch.  No suspicious mediastinal, hilar, or axillary lymphadenopathy.  Widespread sclerotic osseous metastases in the visualized axial and appendicular skeleton, unchanged.  CT ABDOMEN AND PELVIS FINDINGS  Motion degraded images.  Liver, pancreas, and adrenal glands are within normal limits.  Spleen is within normal limits. Prior anterior splenic and perisplenic lesions have resolved.  Gallbladder is unremarkable. No intrahepatic or extrahepatic ductal dilatation.  2.3 x 1.8 cm anterior right upper pole renal cyst with suspected enhancing rim (series 4/image 13), although decreased, previously 5.1 x 4.4 cm. 1.5 cm posterior interpolar left renal cyst (series 4/ image 11). 1.3 cm anterior left lower pole renal cyst (series 4/ image 19). No hydronephrosis.  No evidence bowel obstruction. Normal appendix.  Atherosclerotic calcifications of the abdominal aorta and branch vessels.  No abdominopelvic ascites.  No suspicious abdominopelvic lymphadenopathy.  Uterus is notable for calcified uterine fibroids. Bilateral ovaries are unremarkable.  Bladder is within normal limits.  Widespread sclerotic osseous metastases in the visualized axial and appendicular skeleton, unchanged.  IMPRESSION: CT CHEST IMPRESSION  Motion degraded images.  Scattered small pulmonary nodules measuring 4-5 mm, as described above, unchanged and indeterminate. Continued attention on follow-up is suggested.  Widespread sclerotic osseous metastases, unchanged.  CT ABDOMEN AND PELVIS IMPRESSION  Motion degraded images.  Prior anterior splenic and perisplenic lesions have resolved, of uncertain underlying etiology.  2.3 cm anterior right upper pole renal cyst with suspected enhancing rim, although decreased.   Widespread sclerotic osseous metastases, unchanged.   Electronically Signed   By: Charline Bills M.D.   On: 05/13/2013 16:36   Mm Digital Diagnostic Unilat R  05/04/2013   *RADIOLOGY REPORT*  Clinical Data:  Right breast cancer metastatic to bone.  The patient is currently taking Letrozole.  Evaluate  for response.  DIGITAL DIAGNOSTIC RIGHT MAMMOGRAM WITH CAD AND RIGHT BREAST ULTRASOUND:  Comparison:  11/07/2012  Findings:  ACR Breast Density Category b:  There are scattered areas of fibroglandular density.  Malignant type calcifications in the right upper outer quadrant posteriorly are again noted.  These cover an area approximately 2.3 x 2.7 x 5.0 cm in size.  The area of diffuse increased density in the upper outer quadrant of the right breast noted on prior study has decreased.  No definitive mass is identified.  Previously noted periareolar skin thickening has nearly completely resolved.  Mammographic images were processed with CAD.  On physical exam, no mass is palpated in the outer portion of the right breast.  Ultrasound is performed, showing clustered masses in the 10 o'clock position right breast, 11 cm from  the right nipple likely corresponding to the area of calcifications.  This area measures 3.2 x 1.2 x 2.7 cm.  This is likely residual carcinoma.  No [name] definite abnormality is identified.  If further evaluation of the right breast would be helpful, breast MRI may be helpful to better delineate the extent of residual disease.  IMPRESSION: Interval decrease in size of known carcinoma in the outer portion of the right breast.  RECOMMENDATION: Clinical treatment plan.  I have discussed the findings and recommendations with the patient. Results were also provided in writing at the conclusion of the visit.  If applicable, a reminder letter will be sent to the patient regarding the next appointment.  BI-RADS CATEGORY 6:  Known biopsy-proven malignancy - appropriate action should be taken.   Original  Report Authenticated By: Cain Saupe, M.D.    ASSESSMENT: Metastatic breast cancer on palliative letrozole  PLAN:  1. Grade 2 dystolic dysfunction: she is euvolumic today on exam. She is on Torsemide.  2. Diabetes mellitus, type II. Diet controlled.  3. COPD: On albuterol prn. She remains on home oxygen. 4. Chronic renal insufficiency.  5. Metastatic breast cancer (ER pos/PR pos/ HER2 neg) with spread to bone.  -Clinically, she is having response with stable or slightly improved breast mass.  CT of Chest does however show a new lung nodule.  She is tolerating letrozole well without bone/muscle pain, hot flash/night sweat. She is taking Calcium to decrease risk of osteoporosis.  - She continues to decline zometa or denosumab for her bone disease.  - Handouts were provided about metastatic disease. 6. Anemia of chronic disease: Past work up for reversible causes of anemia were negative.  7. Follow up: In about 3 months with CBC, CMP.  We will consider staging based on clinical symptoms or in 3-6 months.   All questions were answered. The patient knows to call the clinic with any problems, questions or concerns. We can certainly see the patient much sooner if necessary.  I spent 20 minutes counseling the patient face to face. The total time spent in the appointment was 40 minutes.    Sejla Marzano, MD 05/18/2013 3:21 PM

## 2013-05-18 NOTE — Patient Instructions (Signed)
Bone Metastases Cancerous growths can begin in any part of the body. The original site of cancer is called the primary tumor or primary cancer (for example, breast cancer). After cancer has developed in one area of the body, cancerous cells from that area can break away and travel through the body's bloodstream. If these cancerous cells begin growing in another place in the body, they are called metastases. Bone metastases are cancer cells that have spread to the bone (which is different from a cancer that starts in the bone). These secondary growths are like the original tumor. For example, if a prostate cancer spreads to bone it is called metastatic prostate cancer, or prostate cancer metastatic to bone, but not bone cancer. Cancers can spread to almost any bone; the spine and pelvis are often involved.  Any type of cancer can spread to the bone, but the most common are breast, lung, kidney, thyroid and prostate cancers. Sometimes the primary tumor is not discovered until there are bone problems. If the primary cancer location cannot be discovered, the cancer is called cancer of unknown primary location. SYMPTOMS  Pain in the bones is the main symptom of bone metastases. Some other problems may occur first including:  Decreased appetite.  Nausea.  Muscle weakness.  Confusion.  Unusual sleep patterns due to discomfort.  Overly tired (fatigue).  Restlessness. Frail or brittle bones may lead to broken bones (fractures) that lead to learning what is wrong (diagnosis). A tumor often weakens the bones.  DIAGNOSIS  Metastatic cancers may be found months or years after or at the same time as the primary tumor. When a second tumor is found in a patient who has been treated for cancer, it is more often a metastasis than another primary tumor.  The patient's symptoms, physical examination, X-rays and blood tests may suggest a bone metastases. In addition, an examination of tissue or a cell sample  (biopsy) is usually done to find the cancer. This sample is removed with a needle. This tissue sample must be looked at under a microscope to confirm a diagnosis. TREATMENT  Options generally include treatments that give relief from symptoms (palliative) or curative. Those with advanced, metastasized cancer may receive treatment focused on pain relief and prolonging life. These treatments depend on the type of cancer and its location.  Treatment for cancer depends on its type and location. Some of these treatments are:  Surgery to remove the original tumor and/or to remove parts of the body that produce hormones and other chemicals that make cancer worse.  Treatment with drugs (chemotherapy).  Bone marrow transplantations on rare occasions.  Radiation therapy (radiotherapy).  Hormonal therapy.  Pain relieving medications. Your caregiver will help you understand the likelihood that any particular treatment will be helpful for you. While some treatments aim to cure or control the cancer, others give relief from symptoms only. If you have bone metastases, radiation therapy may be recommended to treat pain (if it is in one main location). Pain medications are available. These include strong medicines like morphine. You may be instructed to take a long-acting pain medication (to control most of your pain) and a short-acting medication to control occasional flares of pain. Pain medication is sometimes also given continuously through a pump. HOME CARE INSTRUCTIONS   Take medications exactly as prescribed.  Keep any follow-up appointments.  Pain medications can make you sleepy or confused. Do not drive, climb ladders, or do other dangerous activities while on pain medication.  Pain medications often  cause constipation. Ask your caregiver for information on stool softeners.  Do not share your pain medication with others. SEEK MEDICAL CARE IF:   Your bone pain is not controlled.  You are having  problems or side effects from your medication.  You have excessive sleepiness or confusion. SEEK IMMEDIATE MEDICAL CARE IF:   You fall and have any injury or pain from the fall.  You have trouble walking.  You have numbness or tingling in your legs.  You develop a sudden significant worsening of your pain. Document Released: 08/03/2002 Document Revised: 11/05/2011 Document Reviewed: 03/26/2008 [facility] Patient Information 2014 [place], [place]. Metastatic Cancer, Questions and Answers KEY POINTS  Cancer happens when cells become abnormal and grow without control.  Where the cancer started is called the primary cancer or the primary tumor.  Metastatic cancer happens when cancer cells spread from the place where it started to other parts of the body.  When cancer spreads, the metastatic cancer keeps the same type of cells and the same name as the primary tumor.  The most common sites of metastasis are the lungs, bones, liver, and brain.  Treatment for metastatic cancer usually depends on the type of cancer. It also depends on the size and location of the metastasis. WHAT IS CANCER?   Cancer is a group of many related diseases. All cancers begin in cells. Cells are the building blocks that make up tissues. Cancer that arises from organs and solid tissues is called a solid tumor. Cancer that begins in blood cells is called leukemia, multiple myeloma, or lymphoma.  Normally, cells grow and divide to form new cells as the body needs them. When cells grow old and die, new cells take their place. Sometimes this orderly process goes wrong. New cells form when the body does not need them. Old cells do not die when they should.  The extra cells form a mass of tissue. This is called a growth or tumor. Tumors can be either not cancerous (benign) or cancerous (malignant). Benign tumors do not spread to other parts of the body. They are rarely a threat to life. Malignant tumors can spread  (metastasize) and may be life threatening. WHAT IS PRIMARY CANCER?  Cancer can begin in any organ or tissue of the body. The original tumor is called the primary cancer or primary tumor. It is usually named for the part of the body or the type of cell in which it begins. WHAT IS METASTASIS, AND HOW DOES IT HAPPEN?   Metastasis means the spread of cancer. Cancer cells can break away from a primary tumor and enter the bloodstream or lymphatic system. This is the system that produces, stores, and carries the cells that fight infections. That is how cancer cells spread to other parts of the body.  When cancer cells spread and form a new tumor in a different organ, the new tumor is a metastatic tumor. The cells in the metastatic tumor come from the original tumor. For example, if breast cancer spreads to the lungs, the metastatic tumor in the lung is made up of cancerous breast cells. It is not made of lung cells. In this case, the disease in the lungs is metastatic breast cancer (not lung cancer). Under a microscope, metastatic breast cancer cells generally look the same as the cancer cells in the breast. WHERE DOES CANCER SPREAD?   Cancer cells can spread to almost any part of the body. Cancer cells frequently spread to lymph nodes (rounded masses of  lymphatic tissue) near the primary tumor (regional lymph nodes). This is called lymph node involvement or regional disease. Cancer that spreads to other organs or to lymph nodes far from the primary tumor is called metastatic disease. Caregivers sometimes also call this distant disease.  The most common sites of metastasis from solid tumors are the lungs, bones, liver, and brain. Some cancers tend to spread to certain parts of the body. For example, lung cancer often metastasizes to the brain or bones. Colon cancer often spreads to the liver. Prostate cancer tends to spread to the bones. Breast cancer commonly spreads to the bones, lungs, liver, or brain. But  each of these cancers can spread to other parts of the body as well.  Because blood cells travel throughout the body, leukemia, multiple myeloma, and lymphoma cells are usually not localized when the cancer is diagnosed. Tumor cells may be found in the blood, several lymph nodes, or other parts of the body such as the liver or bones. This type of spread is not referred to as metastasis. ARE THERE SYMPTOMS OF METASTATIC CANCER?   Some people with metastatic cancer do not have symptoms. Their metastases are found by X-rays and other tests performed for other reasons.  When symptoms of metastatic cancer occur, the type and frequency of the symptoms will depend on the size and location of the metastasis. For example, cancer that spreads to the bones is likely to cause pain and can lead to bone fractures. Cancer that spreads to the brain can cause a variety of symptoms. These include headaches, seizures, and unsteadiness. Shortness of breath may be a sign of lung involvement. Abdominal swelling or yellowing of the skin (jaundice) can indicate that cancer has spread to the liver.  Sometimes a person's primary cancer is discovered only after the metastatic tumor causes symptoms. For example, a man whose prostate cancer has spread to the bones in his pelvis may have lower back pain (caused by the cancer in his bones) before he experiences any symptoms from the primary tumor in his prostate. HOW DOES THE CAREGIVER KNOW WHETHER A CANCER IS PRIMARY OR A METASTATIC TUMOR?  To determine whether a tumor is primary or metastatic, the tumor will be examined under a microscope. In general, cancer cells look like abnormal versions of cells in the tissue where the cancer began. Using specialized diagnostic tests, a trained person is often able to tell where the cancer cells came from. Markers or antigens found in or on the cancer cells can indicate the primary site of the cancer.  Metastatic cancers may be found before  or at the same time as the primary tumor, or months or years later. When a new tumor is found in a patient who has been treated for cancer in the past, it is more often a metastasis than another primary tumor. IS IT POSSIBLE TO HAVE A METASTATIC TUMOR WITHOUT HAVING A PRIMARY CANCER?  No. A metastatic tumor always starts from cancer cells in another part of the body. In most cases, when a metastatic tumor is found first, the primary tumor can be found. The search for the primary tumor may involve lab tests, X-rays, and other procedures. However, in a small number of cases, a metastatic tumor is diagnosed but the primary tumor cannot be found, in spite of extensive tests. The tumor is metastatic because the cells are not like those in the organ or tissue in which the tumor is found. The primary tumor is called unknown or  hidden (occult). The patient is said to have cancer of unknown primary origin (CUP). Because diagnostic techniques are constantly improving, the number of cases of CUP is going down.  WHAT TREATMENTS ARE USED FOR METASTATIC CANCER?   When cancer has metastasized, it may be treated with:  Chemotherapy.  Radiation therapy.  Biological therapy.  Hormone therapy.  Surgery.  Cryosurgery.  A combination of these.  The choice of treatment generally depends on the:  Type of primary cancer.  Size and location of the metastasis.  Patient's age and general health.  Types of treatments the patient has had in the past. In patients with CUP, it is possible to treat the disease even though the primary tumor has not been located. The goal of treatment may be to control the cancer, or to relieve symptoms or side effects of treatment. ARE NEW TREATMENTS FOR METASTATIC CANCER BEING DEVELOPED?  Yes, many new cancer treatments are under study. To develop new treatments, the NCI sponsors clinical trials (research studies) with cancer patients in many hospitals, universities, medical  schools, and cancer centers around the country. Clinical trials are a critical step in the improvement of treatment. Before any new treatment can be recommended for general use, doctors conduct studies to find out whether the treatment is both safe for patients and effective against the disease. The results of such studies have led to progress not only in the treatment of cancer, but in the detection, diagnosis, and prevention of the disease as well. Patients interested in taking part in a clinical trial should talk with their caregivers. FOR MORE INFORMATION National Cancer Institute (NCI): www.cancer.gov Document Released: 12/18/2004 Document Revised: 11/05/2011 Document Reviewed: 08/05/2008 [facility] Patient Information 2014 [place], [place]. Breast Cancer Tumor Analysis This test is done on a tissue biopsy. It is used to determine the likelihood of recurrence of breast cancer after a number of studies have been done. This also will help to determine the course of any future treatment. PREPARATION FOR TEST This will be determined by your caregiver. NORMAL FINDINGS DNA ploidy  Aneuploid is unfavorable  Dibloid is favorable S-phase fraction  Greater than 5.5% is unfavorable  Less than 5.5% is favorable Cathepsin D  Greater than 10% is unfavorable  Less than 10% is favorable HER 2 protein  Moderate or strong staining in 10% of the cancer cells is unfavorable  Partial staining in less than 10% of the cancer cells is favorable P53 protein  Greater than 10% is unfavorable  Less than 10% is favorable Ki67 protein  Greater than 20% is unfavorable  10%-20% is borderline  Less than 20% is favorable Ranges for normal findings may vary among different laboratories and hospitals. You should always check with your doctor after having lab work or other tests done to discuss the meaning of your test results and whether your values are considered within normal limits. MEANING OF TEST   Your caregiver will go over the test results with you and discuss the importance and meaning of your results, as well as treatment options and the need for additional tests if necessary. OBTAINING THE TEST RESULTS It is your responsibility to obtain your test results. Ask the lab or department performing the test when and how you will get your results. Document Released: 09/04/2004 Document Revised: 11/05/2011 Document Reviewed: 07/20/2008 [facility] Patient Information 2014 [place], [place]. Letrozole tablets What is this medicine? LETROZOLE (LET roe zole) blocks the production of estrogen. Certain types of breast cancer grow under the influence of estrogen. Letrozole  helps block tumor growth. This medicine is used to treat advanced breast cancer in postmenopausal women. This medicine may be used for other purposes; ask your health care provider or pharmacist if you have questions. What should I tell my health care provider before I take this medicine? They need to know if you have any of these conditions: -liver disease -osteoporosis (weak bones) -an unusual or allergic reaction to letrozole, other medicines, foods, dyes, or preservatives -pregnant or trying to get pregnant -breast-feeding How should I use this medicine? Take this medicine by mouth with a glass of water. You may take it with or without food. Follow the directions on the prescription label. Take your medicine at regular intervals. Do not take your medicine more often than directed. Do not stop taking except on your doctor's advice. Talk to your pediatrician regarding the use of this medicine in children. Special care may be needed. Overdosage: If you think you have taken too much of this medicine contact a poison control center or emergency room at once. NOTE: This medicine is only for you. Do not share this medicine with others. What if I miss a dose? If you miss a dose, take it as soon as you can. If it is almost time for  your next dose, take only that dose. Do not take double or extra doses. What may interact with this medicine? Do not take this medicine with any of the following medications: -estrogens, like hormone replacement therapy or birth control pills This medicine may also interact with the following medications: -dietary supplements such as androstenedione or DHEA -prasterone -tamoxifen This list may not describe all possible interactions. Give your health care provider a list of all the medicines, herbs, non-prescription drugs, or dietary supplements you use. Also tell them if you smoke, drink alcohol, or use illegal drugs. Some items may interact with your medicine. What should I watch for while using this medicine? Visit your doctor or health care professional for regular check-ups to monitor your condition. Do not use this drug if you are pregnant. Serious side effects to an unborn child are possible. Talk to your doctor or pharmacist for more information. You may get drowsy or dizzy. Do not drive, use machinery, or do anything that needs mental alertness until you know how this medicine affects you. Do not stand or sit up quickly, especially if you are an older patient. This reduces the risk of dizzy or fainting spells. What side effects may I notice from receiving this medicine? Side effects that you should report to your doctor or health care professional as soon as possible: -allergic reactions like skin rash, itching, or hives -bone fracture -chest pain -difficulty breathing or shortness of breath -severe pain, swelling, warmth in the leg -unusually weak or tired -vaginal bleeding Side effects that usually do not require medical attention (report to your doctor or health care professional if they continue or are bothersome): -bone, back, joint, or muscle pain -dizziness -fatigue -fluid retention -headache -hot flashes, night sweats -nausea -weight gain This list may not describe all  possible side effects. Call your doctor for medical advice about side effects. You may report side effects to FDA at 1-800-FDA-1088. Where should I keep my medicine? Keep out of the reach of children. Store between 15 and 30 degrees C (59 and 86 degrees F). Throw away any unused medicine after the expiration date. NOTE: This sheet is a summary. It may not cover all possible information. If you have questions about this  medicine, talk to your doctor, pharmacist, or health care provider.  2012, [employer]vier/Gold Standard. (10/24/2007 4:43:44 PM)

## 2013-05-20 ENCOUNTER — Ambulatory Visit (HOSPITAL_COMMUNITY): Payer: Medicare HMO

## 2013-05-21 ENCOUNTER — Telehealth: Payer: Self-pay | Admitting: Internal Medicine

## 2013-05-21 NOTE — Telephone Encounter (Signed)
lvm for pt regarding to Dec appt.... °

## 2013-05-22 ENCOUNTER — Ambulatory Visit (HOSPITAL_COMMUNITY): Payer: Medicare HMO

## 2013-05-22 ENCOUNTER — Ambulatory Visit (INDEPENDENT_AMBULATORY_CARE_PROVIDER_SITE_OTHER): Payer: Medicare HMO | Admitting: Family Medicine

## 2013-05-22 ENCOUNTER — Encounter: Payer: Self-pay | Admitting: Family Medicine

## 2013-05-22 VITALS — BP 131/63 | HR 92 | Temp 98.0°F | Ht 62.0 in | Wt 233.0 lb

## 2013-05-22 DIAGNOSIS — I509 Heart failure, unspecified: Secondary | ICD-10-CM

## 2013-05-22 DIAGNOSIS — M7989 Other specified soft tissue disorders: Secondary | ICD-10-CM

## 2013-05-22 DIAGNOSIS — R490 Dysphonia: Secondary | ICD-10-CM

## 2013-05-22 DIAGNOSIS — E119 Type 2 diabetes mellitus without complications: Secondary | ICD-10-CM

## 2013-05-22 DIAGNOSIS — R300 Dysuria: Secondary | ICD-10-CM

## 2013-05-22 DIAGNOSIS — I5032 Chronic diastolic (congestive) heart failure: Secondary | ICD-10-CM

## 2013-05-22 DIAGNOSIS — N390 Urinary tract infection, site not specified: Secondary | ICD-10-CM

## 2013-05-22 LAB — POCT URINALYSIS DIPSTICK
Glucose, UA: NEGATIVE
Nitrite, UA: POSITIVE
Protein, UA: NEGATIVE
Spec Grav, UA: 1.015
Urobilinogen, UA: 0.2

## 2013-05-22 LAB — POCT UA - MICROSCOPIC ONLY

## 2013-05-22 LAB — POCT GLYCOSYLATED HEMOGLOBIN (HGB A1C): Hemoglobin A1C: 6.9

## 2013-05-22 NOTE — Progress Notes (Signed)
Subjective:     Patient ID: Anita [name], female   DOB: [birth year], 67 y.o.   MRN: [id number]  HPI 67 year old female with an extensive PMH including Chronic diastolic CHF presents today with complaints of weight gain, hoarseness, and possible UTI.  1) CHF - Patient reports that approximately 1 month ago she noticed weight gain of ~ 2 lbs.  - During this time she increased her torsemide dose.  - She reports some possible increased SOB since then.  She does not really feel that this is increased from prior.  Her SOB is mainly with exertion but she now has portable oxygen. - No increased LE edema, chest pain, PND, or orthopnea.  2) ? UTI - Patient reports urinary frequency.  No urgency or dysuria.  She is unsure whether this is from UTI or from her increased diuretic.  - No abdominal pain, fevers, chills.  3) Hoarseness - Patient reports hoarseness x 1 month. - She states that it is becoming a nuisance - She denies any recent illness/URI.  She does report some cough.   She adamantly denies any GERD/Reflux.    History   Social History  . Marital Status: Widowed    Spouse Name: N/A    Number of Children: N/A  . Years of Education: N/A   Social History Main Topics  . Smoking status: Former Smoker -- 2.00 packs/day for 40 years    Types: Cigarettes    Quit date: 09/24/2012  . Smokeless tobacco: Never Used  . Alcohol Use: No  . Drug Use: No  . Sexual Activity: No   Other Topics Concern  . None   Social History Narrative   Lives with husband Anita [name] [phone]; Daughter who would like to make medical decisions is Anita [name] [phone]. Patient in school at [employer] for counseling degree.    Review of Systems Per HPI    Objective:   Physical Exam Exam: General: well appearing female in NAD.  HEENT: NCAT. Throat: no pharyngeal erythema or tonsillar exudate  Cardiovascular: RRR. No murmurs, rubs, or gallops. Respiratory: CTAB. No rales, rhonchi, or wheeze  appreciated.  Abdomen: soft, nontender, nondistended. Extremities: Trace LE edema.     Assessment:     See Problem List     Plan:

## 2013-05-22 NOTE — Patient Instructions (Addendum)
It was nice to see you today.   Below is what we talked about today.   1) CHF - Continue torsemide 50 mg daily - I am checking labs today  2) Hoarseness - I am referring you to ENT - Our office will be in contact.   3) Swelling of Left arm - I am getting a study of your arm to evaluate for DVT

## 2013-05-24 DIAGNOSIS — R490 Dysphonia: Secondary | ICD-10-CM | POA: Insufficient documentation

## 2013-05-24 DIAGNOSIS — N390 Urinary tract infection, site not specified: Secondary | ICD-10-CM | POA: Insufficient documentation

## 2013-05-24 MED ORDER — CEPHALEXIN 500 MG PO CAPS: 500.0000 mg | ORAL_CAPSULE | Freq: Three times a day (TID) | ORAL | Status: DC

## 2013-05-24 NOTE — Assessment & Plan Note (Signed)
UA consistent with UTI. Will treat with Keflex.

## 2013-05-24 NOTE — Assessment & Plan Note (Signed)
Lungs clear on exam and LE edema stable.  Advised continued use of increased dose of Torsemide (50 mg daily).  Patient has had recent BMP so it is not needed today. I attempted to add Beta blocker today (to optimize mortality benefiting medications) but patient refused.

## 2013-05-24 NOTE — Assessment & Plan Note (Signed)
Unclear etiology.  DDx: GERD, secondary to viral illness (less likely given duration of symptoms), vocal cord polyp. I recommended a trial of PPI for possible reflux component, but patient refused.  Patient adamant about ENT referral.  Will arrange referral.

## 2013-05-27 ENCOUNTER — Telehealth: Payer: Self-pay | Admitting: Family Medicine

## 2013-05-27 ENCOUNTER — Ambulatory Visit (HOSPITAL_COMMUNITY): Payer: Medicare HMO

## 2013-05-27 NOTE — Telephone Encounter (Signed)
Patient calls stating she was supposed to be given a referral to an ENT Anita [name]). She has not heard anything. Please advise patient.

## 2013-05-28 NOTE — Telephone Encounter (Signed)
Pt returned Anita [name]'s call. She wants to go to Dr Esmeralda Arthur for  ENT and [facility] for her arm Please advise

## 2013-05-29 ENCOUNTER — Ambulatory Visit (HOSPITAL_COMMUNITY): Payer: Medicare HMO

## 2013-05-29 NOTE — Telephone Encounter (Signed)
Spoke to patient yesterday and explained Dr Haroldine Laws the otolaryngology office will contact her to schedule appointment and she could go to [facility] radiology for the xray of her arm.she voiced understanding.Damariz Paganelli, Virgel Bouquet

## 2013-06-03 ENCOUNTER — Ambulatory Visit (HOSPITAL_COMMUNITY): Payer: Medicare HMO

## 2013-06-05 ENCOUNTER — Ambulatory Visit (HOSPITAL_COMMUNITY): Payer: Medicare HMO

## 2013-06-08 ENCOUNTER — Telehealth: Payer: Self-pay | Admitting: *Deleted

## 2013-06-08 DIAGNOSIS — M7989 Other specified soft tissue disorders: Secondary | ICD-10-CM

## 2013-06-08 NOTE — Telephone Encounter (Signed)
LVM informing patient that the appointment set was cancelled and will be rescheduled.

## 2013-06-08 NOTE — Telephone Encounter (Addendum)
The appointment for her leg [place] was scheduled wrong that is what needs to be changed to doppler

## 2013-06-09 ENCOUNTER — Other Ambulatory Visit (HOSPITAL_COMMUNITY): Payer: Self-pay | Admitting: Family Medicine

## 2013-06-09 ENCOUNTER — Ambulatory Visit (HOSPITAL_COMMUNITY)
Admission: RE | Admit: 2013-06-09 | Discharge: 2013-06-09 | Disposition: A | Discharge status: Home / Self Care | Payer: Medicare HMO | Source: Ambulatory Visit | Attending: Family Medicine | Admitting: Family Medicine

## 2013-06-09 ENCOUNTER — Other Ambulatory Visit: Payer: Self-pay | Admitting: Family Medicine

## 2013-06-09 ENCOUNTER — Ambulatory Visit (HOSPITAL_COMMUNITY): Payer: Medicare HMO

## 2013-06-09 ENCOUNTER — Telehealth: Payer: Self-pay | Admitting: *Deleted

## 2013-06-09 DIAGNOSIS — M25522 Pain in left elbow: Secondary | ICD-10-CM

## 2013-06-09 DIAGNOSIS — M7989 Other specified soft tissue disorders: Secondary | ICD-10-CM

## 2013-06-09 DIAGNOSIS — I82602 Acute embolism and thrombosis of unspecified veins of left upper extremity: Secondary | ICD-10-CM

## 2013-06-09 DIAGNOSIS — M79609 Pain in unspecified limb: Secondary | ICD-10-CM | POA: Insufficient documentation

## 2013-06-09 NOTE — Addendum Note (Signed)
Addended by: Tommie Sams on: 06/09/2013 01:09 PM   Modules accepted: Orders

## 2013-06-09 NOTE — Telephone Encounter (Signed)
Dr Adriana Simas vascular reports patient doppler was negative for DVT. Sheng Pritz, Virgel Bouquet

## 2013-06-09 NOTE — Telephone Encounter (Signed)
US reordered

## 2013-06-10 ENCOUNTER — Ambulatory Visit (HOSPITAL_COMMUNITY): Payer: Medicare HMO

## 2013-06-11 ENCOUNTER — Other Ambulatory Visit (HOSPITAL_COMMUNITY): Payer: Self-pay | Admitting: Otolaryngology

## 2013-06-11 DIAGNOSIS — J383 Other diseases of vocal cords: Secondary | ICD-10-CM

## 2013-06-12 ENCOUNTER — Ambulatory Visit (HOSPITAL_COMMUNITY): Payer: Medicare HMO

## 2013-06-12 ENCOUNTER — Ambulatory Visit: Payer: Medicare HMO | Admitting: Cardiology

## 2013-06-17 ENCOUNTER — Ambulatory Visit (HOSPITAL_COMMUNITY): Payer: Medicare HMO

## 2013-06-18 ENCOUNTER — Ambulatory Visit: Payer: Medicare HMO | Admitting: Cardiology

## 2013-06-18 ENCOUNTER — Telehealth: Payer: Self-pay | Admitting: Internal Medicine

## 2013-06-18 NOTE — Telephone Encounter (Signed)
Pt called and r/s lab and MD to 112/31

## 2013-06-19 ENCOUNTER — Ambulatory Visit (HOSPITAL_COMMUNITY): Payer: Medicare HMO

## 2013-06-24 ENCOUNTER — Ambulatory Visit (HOSPITAL_COMMUNITY)
Admission: RE | Admit: 2013-06-24 | Discharge: 2013-06-24 | Disposition: A | Discharge status: Home / Self Care | Payer: Medicare HMO | Source: Ambulatory Visit | Attending: Otolaryngology | Admitting: Otolaryngology

## 2013-06-24 ENCOUNTER — Ambulatory Visit (HOSPITAL_COMMUNITY): Payer: Medicare HMO

## 2013-06-24 DIAGNOSIS — R599 Enlarged lymph nodes, unspecified: Secondary | ICD-10-CM | POA: Insufficient documentation

## 2013-06-24 DIAGNOSIS — E049 Nontoxic goiter, unspecified: Secondary | ICD-10-CM | POA: Insufficient documentation

## 2013-06-24 DIAGNOSIS — J383 Other diseases of vocal cords: Secondary | ICD-10-CM

## 2013-06-24 DIAGNOSIS — R490 Dysphonia: Secondary | ICD-10-CM | POA: Insufficient documentation

## 2013-06-24 DIAGNOSIS — M47812 Spondylosis without myelopathy or radiculopathy, cervical region: Secondary | ICD-10-CM | POA: Insufficient documentation

## 2013-06-25 ENCOUNTER — Ambulatory Visit (HOSPITAL_COMMUNITY): Payer: Medicare HMO

## 2013-06-26 ENCOUNTER — Other Ambulatory Visit (HOSPITAL_COMMUNITY): Payer: Self-pay | Admitting: Otolaryngology

## 2013-06-26 ENCOUNTER — Ambulatory Visit (HOSPITAL_COMMUNITY): Payer: Medicare HMO

## 2013-06-26 DIAGNOSIS — C799 Secondary malignant neoplasm of unspecified site: Secondary | ICD-10-CM

## 2013-06-29 ENCOUNTER — Ambulatory Visit (HOSPITAL_COMMUNITY): Payer: Medicare HMO

## 2013-06-29 ENCOUNTER — Other Ambulatory Visit (HOSPITAL_COMMUNITY): Payer: Self-pay | Admitting: Otolaryngology

## 2013-06-29 DIAGNOSIS — R599 Enlarged lymph nodes, unspecified: Secondary | ICD-10-CM

## 2013-06-30 ENCOUNTER — Telehealth: Payer: Self-pay | Admitting: Family Medicine

## 2013-06-30 ENCOUNTER — Telehealth: Payer: Self-pay | Admitting: Cardiology

## 2013-06-30 ENCOUNTER — Telehealth: Payer: Self-pay

## 2013-06-30 ENCOUNTER — Telehealth: Payer: Self-pay | Admitting: Pulmonary Disease

## 2013-06-30 NOTE — Telephone Encounter (Signed)
Pt had CT of Neck as ordered by Dr Haroldine Laws - ? Vocal cord cancer with lymph adenopathy.  Per Dr Evlyn Courier note pt should speak with oncologist about further treatment.  Left message for pt that I will forward this information to Dr Antoine Poche for review.

## 2013-06-30 NOTE — Telephone Encounter (Signed)
She would have no contraindication to any diagnostic studies or treatment.

## 2013-06-30 NOTE — Telephone Encounter (Signed)
I spoke with pt. She had CT of soft tissue of neck. She reports a nodule was found and Dr. Brion Aliment is requesting to do a needle biopsy of this. 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 She wants to know how Dr. Craige Cotta feels about her having this done? Please advise thanks

## 2013-06-30 NOTE — Telephone Encounter (Signed)
She would like to get dr Haroldine Laws and dr Adriana Simas together to discuss her test results Please advise

## 2013-06-30 NOTE — Telephone Encounter (Signed)
Attempt to contact pt to discuss in more detail.  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  Will have my nurse inform pt that she needs to discuss with her oncologist about status of her stage IV breast cancer, and then decide if additional evaluation for oral cancer is something she would consider doing.  If she would like to discuss further with me, then please have her provide contact information that she can be reached at.

## 2013-06-30 NOTE — Telephone Encounter (Signed)
lmomtcb x1 for pt 

## 2013-06-30 NOTE — Telephone Encounter (Signed)
New Problem:  Pt states she wants Dr. Antoine Poche to confer with her other doctors. Pt also states she has questions about an upcoming procedure - needle biopsy... Call pt for more details.

## 2013-06-30 NOTE — Telephone Encounter (Signed)
Pt called stating that Dr Haroldine Laws found new nodules in her neck affecting her voice. He wants to do a needle biopsy. She questions the need for this. She is looking at the fact it is probably cancer. She is on letrozole for palliation. She is asking that Dr Rosie Fate talk with Dr Haroldine Laws. She did not ask Dr Haroldine Laws this question. Forwarded to Dr Rosie Fate.

## 2013-07-01 ENCOUNTER — Ambulatory Visit (HOSPITAL_COMMUNITY): Payer: Medicare HMO

## 2013-07-01 ENCOUNTER — Telehealth: Payer: Self-pay

## 2013-07-01 NOTE — Telephone Encounter (Signed)
Pt called at 1031, I called pt back to let her know that Dr Rosie Fate did speak with Dr Haroldine Laws.

## 2013-07-01 NOTE — Telephone Encounter (Signed)
LMTCBon home number, cell number message states customer unavailable. Carron Curie, CMA

## 2013-07-01 NOTE — Telephone Encounter (Signed)
I spoke with the pt and advised. She would like to discuss further with Dr. Craige Cotta. She states she can be reached at [phone], ot in the PM she can be reached at [phone]. Carron Curie, CMA

## 2013-07-01 NOTE — Telephone Encounter (Signed)
Pt aware of recommendations.  She will f/u as scheduled 11/13

## 2013-07-02 ENCOUNTER — Telehealth: Payer: Self-pay

## 2013-07-02 ENCOUNTER — Telehealth: Payer: Self-pay | Admitting: Hematology and Oncology

## 2013-07-02 ENCOUNTER — Telehealth: Payer: Self-pay | Admitting: Family Medicine

## 2013-07-02 NOTE — Telephone Encounter (Signed)
Pt called and I discussed with her that Dr Rosie Fate says whether or not she gets the biopsy is up to the pt's decision and discussion with Dr Haroldine Laws. Pt said Dr Craige Cotta said to talk with Dr Rosie Fate.  When I said "Dr Rosie Fate is OK with either way" pt seemed to understand and had no more questions.

## 2013-07-02 NOTE — Telephone Encounter (Signed)
I spoke with patient.  Patient is concerned about biopsy which was recommended by ENT after suspicious vocal cord lesion seen on physical exam and CT was obtained (CT results are concerning for cancer).  Patient is being followed by Heme-Onc for metastatic breast cancer (for which she declined chemo, radiation, surgery).  I informed her that I think she should proceed with the biopsy.  However, if she doesn't want treatment then this is futile.  I advised close follow up with heme-onc and ENT.

## 2013-07-02 NOTE — Telephone Encounter (Signed)
S/w pt to give sooner appt per pt she stated she would get back with me.  MD notified

## 2013-07-02 NOTE — Telephone Encounter (Signed)
Pt called and would like Dr. Adriana Simas to call her after he reviews her two CT results. She said the other doctor said that she also would need a needle biopsy and she is concerned and really needs to talk to Dr. Adriana Simas. JW

## 2013-07-02 NOTE — Telephone Encounter (Signed)
Please advise. Anita [name] S  

## 2013-07-02 NOTE — Telephone Encounter (Signed)
I will try to call patient today.

## 2013-07-02 NOTE — Telephone Encounter (Signed)
Spoke to pt over phone.  Explained CT neck findings are concerning for malignancy.  Explained she needs to d/w her oncologist about status of breast cancer and whether she would be a candidate for additional chemo/XRT for vocal cord cancer.  If not, then I advised she be better off foregoing biopsy of vocal cord lesion.  However, if her oncologist feels she is a reasonable candidate for additional therapy of vocal cord lesion, then she should proceed with biopsy.

## 2013-07-03 ENCOUNTER — Ambulatory Visit (HOSPITAL_COMMUNITY): Payer: Medicare HMO

## 2013-07-06 ENCOUNTER — Encounter (HOSPITAL_COMMUNITY): Payer: Self-pay | Admitting: Emergency Medicine

## 2013-07-06 ENCOUNTER — Inpatient Hospital Stay (HOSPITAL_COMMUNITY)
Admission: EM | Admit: 2013-07-06 | Discharge: 2013-07-09 | DRG: 280 | Disposition: A | Discharge status: Home / Self Care | Payer: Medicare HMO | Attending: Family Medicine | Admitting: Family Medicine

## 2013-07-06 ENCOUNTER — Ambulatory Visit (HOSPITAL_COMMUNITY): Payer: Medicare HMO

## 2013-07-06 ENCOUNTER — Emergency Department (HOSPITAL_COMMUNITY): Payer: Medicare HMO

## 2013-07-06 DIAGNOSIS — E785 Hyperlipidemia, unspecified: Secondary | ICD-10-CM | POA: Diagnosis present

## 2013-07-06 DIAGNOSIS — I509 Heart failure, unspecified: Secondary | ICD-10-CM | POA: Diagnosis present

## 2013-07-06 DIAGNOSIS — J9620 Acute and chronic respiratory failure, unspecified whether with hypoxia or hypercapnia: Secondary | ICD-10-CM | POA: Diagnosis present

## 2013-07-06 DIAGNOSIS — Z8249 Family history of ischemic heart disease and other diseases of the circulatory system: Secondary | ICD-10-CM

## 2013-07-06 DIAGNOSIS — N390 Urinary tract infection, site not specified: Secondary | ICD-10-CM

## 2013-07-06 DIAGNOSIS — N184 Chronic kidney disease, stage 4 (severe): Secondary | ICD-10-CM | POA: Diagnosis present

## 2013-07-06 DIAGNOSIS — E876 Hypokalemia: Secondary | ICD-10-CM

## 2013-07-06 DIAGNOSIS — C50911 Malignant neoplasm of unspecified site of right female breast: Secondary | ICD-10-CM

## 2013-07-06 DIAGNOSIS — R490 Dysphonia: Secondary | ICD-10-CM | POA: Diagnosis present

## 2013-07-06 DIAGNOSIS — Z87891 Personal history of nicotine dependence: Secondary | ICD-10-CM

## 2013-07-06 DIAGNOSIS — C7800 Secondary malignant neoplasm of unspecified lung: Secondary | ICD-10-CM | POA: Diagnosis present

## 2013-07-06 DIAGNOSIS — J383 Other diseases of vocal cords: Secondary | ICD-10-CM | POA: Diagnosis present

## 2013-07-06 DIAGNOSIS — C7951 Secondary malignant neoplasm of bone: Secondary | ICD-10-CM | POA: Diagnosis present

## 2013-07-06 DIAGNOSIS — R739 Hyperglycemia, unspecified: Secondary | ICD-10-CM

## 2013-07-06 DIAGNOSIS — C50919 Malignant neoplasm of unspecified site of unspecified female breast: Secondary | ICD-10-CM | POA: Diagnosis present

## 2013-07-06 DIAGNOSIS — D638 Anemia in other chronic diseases classified elsewhere: Secondary | ICD-10-CM | POA: Diagnosis present

## 2013-07-06 DIAGNOSIS — Z853 Personal history of malignant neoplasm of breast: Secondary | ICD-10-CM

## 2013-07-06 DIAGNOSIS — I214 Non-ST elevation (NSTEMI) myocardial infarction: Principal | ICD-10-CM | POA: Diagnosis present

## 2013-07-06 DIAGNOSIS — Z833 Family history of diabetes mellitus: Secondary | ICD-10-CM

## 2013-07-06 DIAGNOSIS — J449 Chronic obstructive pulmonary disease, unspecified: Secondary | ICD-10-CM | POA: Diagnosis present

## 2013-07-06 DIAGNOSIS — I2510 Atherosclerotic heart disease of native coronary artery without angina pectoris: Secondary | ICD-10-CM | POA: Diagnosis present

## 2013-07-06 DIAGNOSIS — Z9981 Dependence on supplemental oxygen: Secondary | ICD-10-CM

## 2013-07-06 DIAGNOSIS — I252 Old myocardial infarction: Secondary | ICD-10-CM

## 2013-07-06 DIAGNOSIS — I5032 Chronic diastolic (congestive) heart failure: Secondary | ICD-10-CM

## 2013-07-06 DIAGNOSIS — I5043 Acute on chronic combined systolic (congestive) and diastolic (congestive) heart failure: Secondary | ICD-10-CM | POA: Diagnosis present

## 2013-07-06 DIAGNOSIS — Z6841 Body Mass Index (BMI) 40.0 and over, adult: Secondary | ICD-10-CM

## 2013-07-06 DIAGNOSIS — N189 Chronic kidney disease, unspecified: Secondary | ICD-10-CM

## 2013-07-06 DIAGNOSIS — N183 Chronic kidney disease, stage 3 unspecified: Secondary | ICD-10-CM | POA: Diagnosis present

## 2013-07-06 DIAGNOSIS — Z7982 Long term (current) use of aspirin: Secondary | ICD-10-CM

## 2013-07-06 DIAGNOSIS — J441 Chronic obstructive pulmonary disease with (acute) exacerbation: Secondary | ICD-10-CM | POA: Diagnosis present

## 2013-07-06 DIAGNOSIS — E119 Type 2 diabetes mellitus without complications: Secondary | ICD-10-CM | POA: Diagnosis present

## 2013-07-06 DIAGNOSIS — R7989 Other specified abnormal findings of blood chemistry: Secondary | ICD-10-CM

## 2013-07-06 DIAGNOSIS — I5033 Acute on chronic diastolic (congestive) heart failure: Secondary | ICD-10-CM | POA: Diagnosis present

## 2013-07-06 HISTORY — DX: Chronic respiratory failure, unspecified whether with hypoxia or hypercapnia: J96.10

## 2013-07-06 HISTORY — DX: Other diseases of vocal cords: J38.3

## 2013-07-06 HISTORY — DX: Chronic diastolic (congestive) heart failure: I50.32

## 2013-07-06 LAB — URINALYSIS, ROUTINE W REFLEX MICROSCOPIC
Glucose, UA: NEGATIVE mg/dL
Nitrite: POSITIVE — AB
Specific Gravity, Urine: 1.006 (ref 1.005–1.030)
pH: 6 (ref 5.0–8.0)

## 2013-07-06 LAB — TROPONIN I
Troponin I: 0.51 ng/mL (ref <0.30)
Troponin I: 3.32 ng/mL (ref <0.30)

## 2013-07-06 LAB — BASIC METABOLIC PANEL
BUN: 17 mg/dL (ref 6–23)
CO2: 24 mEq/L (ref 19–32)
Calcium: 9.8 mg/dL (ref 8.4–10.5)
Glucose, Bld: 219 mg/dL — ABNORMAL HIGH (ref 70–99)
Potassium: 3.1 mEq/L — ABNORMAL LOW (ref 3.5–5.1)
Sodium: 141 mEq/L (ref 135–145)

## 2013-07-06 LAB — URINE MICROSCOPIC-ADD ON

## 2013-07-06 LAB — CBC
HCT: 33.3 % — ABNORMAL LOW (ref 36.0–46.0)
Hemoglobin: 10.8 g/dL — ABNORMAL LOW (ref 12.0–15.0)
MCH: 24.8 pg — ABNORMAL LOW (ref 26.0–34.0)
RBC: 4.35 MIL/uL (ref 3.87–5.11)

## 2013-07-06 MED ORDER — ISOSORBIDE MONONITRATE ER 30 MG PO TB24
30.0000 mg | ORAL_TABLET | Freq: Every day | ORAL | Status: DC
Start: 2013-07-07 — End: 2013-07-07
  Administered 2013-07-07: 13:00:00 30 mg via ORAL
  Filled 2013-07-06: qty 1

## 2013-07-06 MED ORDER — TIOTROPIUM BROMIDE MONOHYDRATE 18 MCG IN CAPS
18.0000 ug | ORAL_CAPSULE | Freq: Every day | RESPIRATORY_TRACT | Status: DC
  Administered 2013-07-08 – 2013-07-09 (×2): 18 ug via RESPIRATORY_TRACT
  Filled 2013-07-06 (×2): qty 5

## 2013-07-06 MED ORDER — DOCUSATE SODIUM 100 MG PO CAPS
100.0000 mg | ORAL_CAPSULE | Freq: Two times a day (BID) | ORAL | Status: DC | PRN
  Filled 2013-07-06: qty 1

## 2013-07-06 MED ORDER — NITROGLYCERIN 0.4 MG SL SUBL
0.4000 mg | SUBLINGUAL_TABLET | SUBLINGUAL | Status: DC | PRN
  Administered 2013-07-07: 14:00:00 0.4 mg via SUBLINGUAL
  Filled 2013-07-06: qty 25

## 2013-07-06 MED ORDER — ALBUTEROL SULFATE (5 MG/ML) 0.5% IN NEBU
5.0000 mg | INHALATION_SOLUTION | Freq: Once | RESPIRATORY_TRACT | Status: AC
  Administered 2013-07-06: 5 mg via RESPIRATORY_TRACT
  Filled 2013-07-06: qty 1

## 2013-07-06 MED ORDER — HEPARIN (PORCINE) IN NACL 100-0.45 UNIT/ML-% IJ SOLN
1400.0000 [IU]/h | INTRAMUSCULAR | Status: DC
  Administered 2013-07-07: 06:00:00 1300 [IU]/h via INTRAVENOUS
  Filled 2013-07-06 (×4): qty 250

## 2013-07-06 MED ORDER — FUROSEMIDE 10 MG/ML IJ SOLN
80.0000 mg | Freq: Once | INTRAMUSCULAR | Status: AC
  Administered 2013-07-06: 21:00:00 80 mg via INTRAVENOUS
  Filled 2013-07-06: qty 8

## 2013-07-06 MED ORDER — ASPIRIN 81 MG PO CHEW
324.0000 mg | CHEWABLE_TABLET | Freq: Once | ORAL | Status: AC
  Administered 2013-07-06: 324 mg via ORAL
  Filled 2013-07-06: qty 4

## 2013-07-06 MED ORDER — ALBUTEROL SULFATE HFA 108 (90 BASE) MCG/ACT IN AERS
1.0000 | INHALATION_SPRAY | Freq: Two times a day (BID) | RESPIRATORY_TRACT | Status: DC | PRN
  Administered 2013-07-08 (×2): 1 via RESPIRATORY_TRACT
  Filled 2013-07-06 (×2): qty 6.7

## 2013-07-06 MED ORDER — LEVOFLOXACIN IN D5W 750 MG/150ML IV SOLN
750.0000 mg | Freq: Once | INTRAVENOUS | Status: DC
  Filled 2013-07-06: qty 150

## 2013-07-06 MED ORDER — SODIUM CHLORIDE 0.9 % IJ SOLN
3.0000 mL | Freq: Two times a day (BID) | INTRAMUSCULAR | Status: DC
  Administered 2013-07-06 – 2013-07-09 (×6): 3 mL via INTRAVENOUS

## 2013-07-06 MED ORDER — DIPHENHYDRAMINE HCL 50 MG/ML IJ SOLN: 25.0000 mg | INTRAMUSCULAR | Status: DC

## 2013-07-06 MED ORDER — DEXTROSE 5 % IV SOLN: 1.0000 g | Freq: Once | INTRAVENOUS | Status: DC

## 2013-07-06 MED ORDER — FAMOTIDINE IN NACL 20-0.9 MG/50ML-% IV SOLN
20.0000 mg | INTRAVENOUS | Status: DC
  Filled 2013-07-06: qty 50

## 2013-07-06 MED ORDER — LETROZOLE 2.5 MG PO TABS
2.5000 mg | ORAL_TABLET | Freq: Every day | ORAL | Status: DC
  Administered 2013-07-07 – 2013-07-09 (×3): 2.5 mg via ORAL
  Filled 2013-07-06 (×3): qty 1

## 2013-07-06 MED ORDER — ASPIRIN 81 MG PO CHEW
81.0000 mg | CHEWABLE_TABLET | ORAL | Status: AC
  Administered 2013-07-07: 06:00:00 81 mg via ORAL
  Filled 2013-07-06: qty 1

## 2013-07-06 MED ORDER — POTASSIUM CHLORIDE CRYS ER 20 MEQ PO TBCR
40.0000 meq | EXTENDED_RELEASE_TABLET | Freq: Once | ORAL | Status: AC
  Administered 2013-07-06: 21:00:00 40 meq via ORAL
  Filled 2013-07-06: qty 2

## 2013-07-06 MED ORDER — CALCIUM CITRATE 950 (200 CA) MG PO TABS
200.0000 mg | ORAL_TABLET | Freq: Every day | ORAL | Status: DC
  Administered 2013-07-07 – 2013-07-09 (×3): 200 mg via ORAL
  Filled 2013-07-06 (×3): qty 1

## 2013-07-06 MED ORDER — HEPARIN BOLUS VIA INFUSION
4000.0000 [IU] | Freq: Once | INTRAVENOUS | Status: AC
  Administered 2013-07-06: 4000 [IU] via INTRAVENOUS
  Filled 2013-07-06: qty 4000

## 2013-07-06 MED ORDER — ASPIRIN EC 81 MG PO TBEC
81.0000 mg | DELAYED_RELEASE_TABLET | Freq: Every day | ORAL | Status: DC
  Administered 2013-07-07 – 2013-07-09 (×3): 81 mg via ORAL
  Filled 2013-07-06 (×3): qty 1

## 2013-07-06 MED ORDER — SODIUM CHLORIDE 0.9 % IV SOLN
INTRAVENOUS | Status: DC
  Administered 2013-07-07: 14:00:00 10 mL/h via INTRAVENOUS

## 2013-07-06 MED ORDER — ATORVASTATIN CALCIUM 40 MG PO TABS
40.0000 mg | ORAL_TABLET | Freq: Every day | ORAL | Status: DC
  Administered 2013-07-07 – 2013-07-09 (×3): 40 mg via ORAL
  Filled 2013-07-06 (×3): qty 1

## 2013-07-06 MED ORDER — IPRATROPIUM BROMIDE 0.02 % IN SOLN
0.5000 mg | Freq: Once | RESPIRATORY_TRACT | Status: AC
  Administered 2013-07-06: 0.5 mg via RESPIRATORY_TRACT
  Filled 2013-07-06: qty 2.5

## 2013-07-06 NOTE — ED Notes (Signed)
Cardiologist in to assess pt at this time.   

## 2013-07-06 NOTE — ED Notes (Signed)
resp at bedside upon pt arr8ival

## 2013-07-06 NOTE — ED Notes (Signed)
To x-ray

## 2013-07-06 NOTE — ED Notes (Signed)
Pt asked for a foley states took a big fluid pill before calling 911 . Pt aaox3 pt asked for nurse to call her sister which i did

## 2013-07-06 NOTE — Consult Note (Signed)
Cardiology Consultation Note  Anita [name] ID: Anita [name], MRN: [id number], DOB/AGE: 02-Aug-1946 67 y.o. Admit date: 07/06/2013   Date of Consult: 07/06/2013 Primary Physician: Everlene Other, DO Primary Cardiologist: Hochrein  Chief Complaint: SOB Reason for Consult: elevated troponin  HPI: Anita [name] is a 67 y/o F with history of breast CA with bone mets (on letrolazole - declined radiation, surgery), COPD (O2 qhs/prn), prior significant anemia, DM, CKD, and previous NSTEMI x 2 who presented to [facility] with SOB. More recently Anita [name] was evaluated by ENT for abnormal vocal cord lesion with CT concerning for invasive tumor, but Anita [name] is not yet sure about pursuing biopsy because Anita [name] doesn't know Anita [name] would be willing to undergo treatment.  Anita [name] was first introduced to cardiology in 08/2012 when Anita [name] had an NSTEMI that was managed medically in the setting of COPD/dCHF exacerbation. Lexiscan nuc 10/2012 showed no ischemia, EF 49%, but did note old inferior wall scar of small size involving the apical inferior, mid-inferior and basal inferior segments. Anita [name] was admitted in 09/2012 with significant anemia Hgb 5.8/melena without clear culprit (EGD/colonscopy unremarkable). Anita [name] was admitted again in 02/2013 with diastolic CHF and CP with NSTEMI at which time the Anita [name] elected medical therapy instead of cath. Echo 02/2013 showed EF 55-60%, grade 1 diastolic dysfunction and no mention of WMA.   Anita [name] presented to the ED today with SOB. On Saturday 07/04/13 Anita [name] dyspnea began but it really peaked this morning. Anita [name] was gasping for air but nothing helped. Anita [name] placed herself on portable O2 without relief so called EMS who found Anita [name] at 88% RA. No difference in dyspnea sitting up or laying down. Anita [name] had about 30 minutes of dull non-radiating chest pressure during the event. CP was not worse with inspiration or palpation. Anita [name] endorses increased LEE for the past 2 weeks and 5lb weight gain. Anita [name] is usually on Torsemide 50mg  daily  but had titrated to 60mg  daily without relief. Anita [name] possibly has eaten more salt. Thus far Anita [name] has received IV solumedrol 125mg  via EMS, atrovent and albuterol nebs. The Anita [name] feels that with the nebs and supplemental O2 provided significant relief. CXR showed mild vasc congestion, interstitial prominence likely bronchitis, no definite or acute process. pBNP 1100, initial troponin 0.51, Hgb 10.8, UA + nitrite, large leuks. Anita [name] received albuterol/atrovent neb, ASA 324mg  and heparin per pharmacy. Anita [name] has had no further CP.  Past Medical History  Diagnosis Date  . Fibroid   . Morbid obesity   . 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 COPD (chronic obstructive pulmonary disease)   . Chronic diastolic CHF (congestive heart failure)   . NSTEMI (non-ST elevated myocardial infarction)     a. NSTEMI 08/2012: med rx; nuc 10/2012: low risk (old scar, no significant ischemia, EF 49% with inf wall HK). b. NSTEMI 02/2013: managed med, pt declined cath at that time.  . Anemia     a. Adm 09/2012 with melena, Hgb 5.8 -> transfused. EGD/colonoscopy unrevealing.  . Breast cancer     a. Mets to bone. ER 100%/ PR 0%/Her2 neu negative.  [name] Ulcers of both lower legs   . Tobacco abuse   . Hyperlipidemia 02/11/2013  . Diabetes mellitus without complication   . Chronic respiratory failure     a. On O2 qhs. also portable O2.  . Chronic renal insufficiency       Most Recent Cardiac Studies: Lexiscan Nuc 10/30/12 Impression  Exercise Capacity: Eugenie Birks  with no exercise.  BP Response: Normal blood pressure response.  Clinical Symptoms: Mild chest pain/dyspnea.  ECG Impression: No significant ST segment change suggestive of ischemia.  Comparison with Prior Nuclear Study: No previous nuclear study performed  Overall Impression: Low risk stress nuclear study. Old inferior wall scar of small size involving the apical inferior, mid-inferior and basal inferior segments. No significant  ischemia. SDS-3  LV Ejection Fraction: 49%. LV Wall Motion: Inferior wall hypokinesis  2D Echo 03/14/13 - Left ventricle: The cavity size was normal. Systolic function was normal. The estimated ejection fraction was in the range of 55% to 60%. Doppler parameters are consistent with abnormal left ventricular relaxation (grade 1 diastolic dysfunction). - Mitral valve: Mildly calcified annulus.   Surgical History:  Past Surgical History  Procedure Laterality Date  . Colonoscopy, esophagogastroduodenoscopy (egd) and esophageal dilation N/A 10/13/2012    Procedure: colonoscopy and egd;  Surgeon: Graylin Shiver, MD;  Location: [facility] ENDOSCOPY;  Service: Endoscopy;  Laterality: N/A;     Home Meds: Prior to Admission medications   Medication Sig Start Date End Date Taking? Authorizing Provider  albuterol (PROVENTIL HFA;VENTOLIN HFA) 108 (90 BASE) MCG/ACT inhaler Inhale 1 puff into the lungs 2 (two) times daily as needed for wheezing or shortness of breath.   Yes Historical Provider, MD  aspirin EC 81 MG EC tablet Take 1 tablet (81 mg total) by mouth daily. 10/02/12  Yes Glori Luis, MD  atorvastatin (LIPITOR) 40 MG tablet Take 1 tablet (40 mg total) by mouth daily at 6 PM. 02/11/13  Yes Tommie Sams, DO  Calcium Citrate (CALCITRATE PO) Take 1 tablet by mouth daily.    Yes Historical Provider, MD  docusate sodium (COLACE) 100 MG capsule Take 1 capsule (100 mg total) by mouth 2 (two) times daily as needed for constipation. 03/23/13  Yes Tommie Sams, DO  isosorbide mononitrate (IMDUR) 30 MG 24 hr tablet Take 30 mg by mouth daily.   Yes Historical Provider, MD  letrozole (FEMARA) 2.5 MG tablet Take 1 tablet (2.5 mg total) by mouth daily. 05/18/13  Yes Myra Rude, MD  tiotropium (SPIRIVA HANDIHALER) 18 MCG inhalation capsule Place 1 capsule (18 mcg total) into inhaler and inhale daily. 12/11/12  Yes Tommie Sams, DO  torsemide (DEMADEX) 10 MG tablet Take 10 mg by mouth daily.   Yes Historical Provider,  MD  torsemide (DEMADEX) 20 MG tablet Take 2 tablets (40 mg total) by mouth daily. 03/17/13  Yes Jacquelin Hawking, MD  nitroGLYCERIN (NITROSTAT) 0.4 MG SL tablet Place 1 tablet (0.4 mg total) under the tongue every 5 (five) minutes as needed for chest pain. 03/17/13   Jacquelin Hawking, MD  Potassium Chloride (KLOR-CON PO) Take 20 mEq by mouth daily.     Historical Provider, MD    Inpatient Medications:    . heparin 1,000 Units/hr (07/06/13 1535)    Allergies:  Allergies  Allergen Reactions  . Omnipaque [Iohexol]     Pt claims Anita [name] developed hives after given contrast  . Benzene Rash    History   Social History  . Marital Status: Widowed    Spouse Name: N/A    Number of Children: N/A  . Years of Education: N/A   Occupational History  . Not on file.   Social History Main Topics  . Smoking status: Former Smoker -- 2.00 packs/day for 40 years    Types: Cigarettes    Quit date: 09/24/2012  . Smokeless tobacco: Never Used  . Alcohol  Use: No  . Drug Use: No  . Sexual Activity: No   Other Topics Concern  . Not on file   Social History Narrative   Lives with husband Solyana Nonaka [phone]; Anita [name] who would like to make medical decisions is [name] [phone]. Anita [name] in school at [employer] for counseling degree.      Family History  Problem Relation Age of Onset  . Cancer Mother     leukemia  . Hypertension Mother   . Diabetes Father   . Heart disease Father     passed away due to heart attack     Review of Systems: General: negative for chills, fever, night sweats or weight changes.  Cardiovascular: see above Dermatological: negative for rash Respiratory: occasional cough Urologic: negative for hematuria Abdominal: negative for nausea, vomiting, diarrhea, bright red blood per rectum, melena, or hematemesis Neurologic: negative for syncope All other systems reviewed and are otherwise negative except as noted above.  Labs:  Recent Labs  07/06/13 1243    TROPONINI 0.51*   Lab Results  Component Value Date   WBC 9.6 07/06/2013   HGB 10.8* 07/06/2013   HCT 33.3* 07/06/2013   MCV 76.6* 07/06/2013   PLT 259 07/06/2013    Recent Labs Lab 07/06/13 1242  NA 141  K 3.1*  CL 102  CO2 24  BUN 17  CREATININE 0.90  CALCIUM 9.8  GLUCOSE 219*   Lab Results  Component Value Date   CHOL 177 03/14/2013   HDL 61 03/14/2013   LDLCALC 96 03/14/2013   TRIG 100 03/14/2013   Lab Results  Component Value Date   DDIMER 0.69* 03/13/2013    Radiology/Studies:  Dg Chest 2 View 07/06/2013   CLINICAL DATA:  Shortness of breath, weakness.  EXAM: CHEST  2 VIEW  COMPARISON:  03/13/2013  FINDINGS: Mild vascular congestion and peribronchial thickening. Mild interstitial prominence, similar to prior study. Heart is borderline in size. No confluent opacities or effusions. No acute bony abnormality.  IMPRESSION: Mild vascular congestion.  Interstitial prominence and peribronchial thickening, likely bronchitis. No definite active or acute process.   Electronically Signed   By: Charlett Nose M.D.   On: 07/06/2013 13:10   Ct Soft Tissue Neck Wo Contrast 06/25/2013   CLINICAL DATA:  Anterior right vocal cord nodule.  Hoarseness.  EXAM: CT NECK WITHOUT CONTRAST  TECHNIQUE: Multidetector CT imaging of the neck was performed following the standard protocol without intravenous contrast.  COMPARISON:  None available.  FINDINGS: There is slight irregularity along the anterior margin of the right true vocal cord with possible invasion into the subjacent thyroid cartilage. I do not see any significant invasion into the adjacent muscles. It is difficult to assess the boundaries without IV contrast. The lesion appears 6 mm in maximal dimension and.  There is asymmetric from submucosal fullness of the left true vocal cord and false vocal cord. The laryngeal ventricle is asymmetrically narrowed on the left.  There is no significant subglottic extension on either side.  A diffuse  thyroid goiter is present with heterogeneity of the parenchyma. There is substernal extension of thyroid tissue on the left. The a nodule at the upper pole of the left lobe of the thyroid measures 1.7 cm maximally.  An enlarged right level 3 lymph node measures 15 x 7 mm on the sagittal reformatted images. No other significant cervical adenopathy is present on either side.  The bone windows demonstrate moderate degenerative changes with loss of endplate disc height  and endplate osteophyte formation from C5 through T1.  The lung apices are clear.  IMPRESSION: 1. Focal nodularity along the anterior aspect of the right true vocal cord with possible extension into the right thyroid cartilage measures 6 mm maximally. This is concerning for invasive tumor. 2. Submucosal fullness within the left false vocal cord and true vocal cord. This is nonspecific. Neoplasm is not excluded. 3. Indeterminate right 15 mm level 3 lymph node could represent metastatic disease. The node remains ovoid, but is somewhat hyperdense. 4. Spondylosis of the cervical spine. 5. Diffuse thyroid goiter with substernal extension from the inferior left lobe. 6. 1.7 cm nodule at the upper pole of the left thyroid lobe. Consider further evaluation with thyroid ultrasound. If Anita [name] is clinically hyperthyroid, consider nuclear medicine thyroid uptake and scan.   Electronically Signed   By: Gennette Pac M.D.   On: 06/25/2013 10:07   EKG: Sinus tach 114bpm, right axis deviation, baseline wander but appear to have mild ST depression II, avF, V6  Physical Exam: Blood pressure 121/64, pulse 107, resp. rate 26, height 5\' 2"  (1.575 m), weight 230 lb (104.327 kg), SpO2 97.00%. General: Well developed obese Anita [name] in no acute distress. Sitting up in chair. Head: Normocephalic, atraumatic, sclera non-icteric, no xanthomas, nares are without discharge.  Neck: Negative for carotid bruits. JVD not elevated. Lungs: Bibasilar crackles. Otherwise moderately  diminished BS throughout. Breathing is unlabored. Heart: RRR - borderline tachy- with S1 S2. No murmurs, rubs, or gallops appreciated. Abdomen: Soft, non-tender, non-distended with normoactive bowel sounds. No hepatomegaly. No rebound/guarding. No obvious abdominal masses. Msk:  Strength and tone appear normal for age. Extremities: No clubbing or cyanosis. 1+ LE edema (large baseline leg habitus).  Distal pedal pulses are 2+ and equal bilaterally. Neuro: Alert and oriented X 3. No facial asymmetry. No focal deficit. Moves all extremities spontaneously. Psych:  Responds to questions appropriately with a normal affect.   Assessment and Plan:   1. Acute on chronic respiratory failure 2. COPD on home O2 qhs/prn 3. Acute on chronic diastolic CHF 4. Elevated troponin (hx of probable CAD/NSTEMI x 2 managed medically) 5. Metastatic breast cancer to bone 6. 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 Diabetes mellitus 8. CKD stage II-III 9. Urinary tract infection, defer rx to primary team  Agree with trial of IV Lasix 80mg  tonight and see how Anita [name] diureses. This is likely multifactorial dyspnea in the setting of COPD exacerbation and diastolic CHF. However, underlying sinus tach and known metastatic cancer, consider eval for PE. It is not clear if vocal cord tumor is playing a role but the Anita [name] is not yet sure if Anita [name] wants to pursue further evaluation of this. Anita [name] biggest concern is that Anita [name] doesn't want to be put to sleep. Agree with aspirin, statin, heparin per pharmacy for now and cycling enzymes. If BP tolerates diuresis, consider adding low dose selective BB to anginal regimen (ie bisoprolol). Continue Imdur.   Given that this is Anita [name] 3rd NSTEMI this year, we discussed the options including continued med rx and possible cardiac cath. At this time Anita [name] is willing to pursue cath. This will allow [place] to clarify what CAD Anita [name] may or may not have. If Anita [name] requires stenting, would favor  BMS given h/o significant anemia and metastatic cancer. Risks/benefits/alternatives discussed and Anita [name] is in agreement. There is question of dye allergy but the Anita [name] doesn't recall this and the Anita [name] affirms that Anita [name]'s never had a reaction before. Anita [name] received IV solu-medrol  today. Will pre-medicate with benadryl and pepcid IV on call. We have set this up for tomorrow preliminarily and the Anita [name] will let us know if Anita [name] changes Anita [name] mind.  Signed, Ronie Spies PA-C 07/06/2013, 3:37 PM  Anita [name] seen and examined and history reviewed. Agree with above findings and plan. Anita [name] with multiple medical problems presents with symptoms of increased dyspnea associated with chest pressure. Anita [name] was hypoxic on presentation. After receiving oxygen, steroids, nebs, and lasix Anita [name] breathing has improved. Troponins are positive for NSTEMI which makes the third time this year. Prior Echo showed good LV function. Myoview earlier in the year showed fixed defects in the inferior wall and apex with mild LV dysfunction. Anita [name] dyspnea now is multifactorial with evidence of COPD exacerbation and diastolic CHF. PE is also a possibility with Anita [name] cancer history. It is difficult to know how much ischemia is contributing to Anita [name] symptoms or whether Anita [name] troponin elevation is related to demand. I discussed the option of cardiac cath to define Anita [name] coronary anatomy and risk. Anita [name] is adamant that Anita [name] doesn't want to be put to sleep. I explained the procedure in detail and told Anita [name] this could be done with light sedation without putting Anita [name] to sleep. Anita [name] is also concerned about dye allergy although Anita [name] has no history of such. Anita [name] has already received IV solumedrol for Anita [name] COPD exacerbation and we will give Benadryl as well. Anita [name] is comfortable with this discussion and Anita [name] and Anita [name] Anita [name] would like to proceed. We will plan radial access. These results will help guide Anita [name] therapy. Anita [name] is clearly not a candidate for CABG but could  potentially be a candidate for PCI. Given history of prior bleed/ anemia I would favor a BMS strategy if PCI needed. Will try and limit contrast load with CKD.   Theron [facility] 07/06/2013 5:04 PM

## 2013-07-06 NOTE — ED Notes (Signed)
Pt still doing well up in chair given ice chips and foley emptied

## 2013-07-06 NOTE — ED Notes (Signed)
Patient is resting comfortably. 

## 2013-07-06 NOTE — H&P (Signed)
Attending Addendum  I examined the patient and discussed the assessment and plan with Dr. Gwenlyn Saran and Caleb Popp. I have reviewed the note and agree.  Patient admitted for acute worsening of SOB and edema concerning for decompensated CHF triggered by ischemic cardiac event. Appreciate cardiology care and recommendations.     Dessa Phi, MD [facility]ILY [facility] TEACHING SERVICE

## 2013-07-06 NOTE — H&P (Signed)
Family Medicine Teaching [facility] Admission History and Physical Service Pager: [phone]  Patient name: Anita [name] Medical record number: [id number] Date of birth: 07-10-1946 Age: 67 y.o. Gender: female  Primary Care Provider: Everlene Other, DO Consultants: Cardiology Code Status: Full Code  Chief Complaint: Shortness of breath  Assessment and Plan: Anita [name] is a 67 y.o. female presenting with shortness of breath . PMH is significant for COPD, CHF, metastatic breast cancer. Currently not having any chest pain, requiring 4LNC oxygen and on heparin gtt, and stable.  #NSTEMI: EKG changes and troponin of 0.51 in the ED. Has a recent history of chest pain and shortness of breath. Prior history of NSTEMI (x1 in 08/2012, x1 in 02/2013) without cath evaluation as patient elected for medical management at the time. Started on heparin drip. Currently not having chest pain.   Admit to inpatient  Continue heparin gtt  Continue aspirin  Continue atorvastatin  Cardiology recommends cardiac cath in AM  Follow-up troponin x3  Follow-up AM EKG  Continue home imdur  #Shortness of breath: with increased O2 requirement from her baseline home O2 (night and as needed during the day).  most likely CHF exacerbation. Could also be COPD exacerbation vs pulmonary embolism since she has increased O2 requirement, hx of breast cancer and is tachycardic.   Will consider CTA to rule out PE  Continuous pulse ox  O2 therapy to keep sats >90  #Acute on chronic dCHF exacerbation: likely triggered by ACS event.  Echo: 02/2013: EF: 55-60% and grade 1 diastolic dysfunction. currently on Torsemide 50mg  BID at home. BNP today of 1148 which is down from her previous values of 2987 and 62130 on previous admissions. Baseline weight is 235-238 per PCP note. Current weight is: 230lbs  Strict ins/outs and follow-up daily  Daily weights and follow-up  Give furosemide 80mg  IV once to assess diuresis  TED  hose  Follow up BP in context of diuresis  # COPD: no evidence of productive cough. Unclear that this is exacerbation.   - hold on steroids. No need for antibiotic coverage at this time.  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 Patient managing with oncologist.  FEN/GI: Heart health, NPO after midnight Prophylaxis: heparin gtt  Disposition: admit to telemetry, admitting physician Dr. Armen Pickup  History of Present Illness: Anita [name] is a 67 y.o. female presenting with shortness of breath for 2 days but woke up today gasping for air. She has had one week of increased swelling and took 60 torsemide instead of 50mg  once last week because of her swelling. She has been compliant with her medication, however, she has not been taking her potassium pill. She has also had to increase her O2 requirement recently from 2L to 4L especially since she started school in August due to her having to walk around more often. She does endorse dull left sided, non-radiating chest pain occuring this morning, pain rated 7/10 and lasting 30 minutes, associated with shortness of breath, occurred as she woke up. She reports 2-3 pillow orthopnea  and PND. She did note some increased swelling in her lower extremities bilaterally. She has no sick contacts. She has a non-productive cough, no lightheadedness, no headache, no changes in vision, no nausea, no vomiting, no abdominal or suprapubic pain, no increased frequency, no hesitancy and no dysuria.  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 She has an oncologist, pulmonologist, and cardiologist.  She received a dose of solumedrol and albuterol/atrovent in the ED for concern of COPD exacerbation.  Review Of Systems: Per HPI with the following additions: Otherwise 12 point review of systems was performed and was unremarkable.  Patient Active Problem List   Diagnosis Date Noted  . UTI (urinary tract infection) 05/24/2013  . Hoarseness 05/24/2013  . Insomnia 03/23/2013  . Hyperlipidemia 02/11/2013  . Breast cancer metastasized to bone 10/14/2012  . Chronic respiratory failure 10/06/2012  . Chronic diastolic congestive heart failure 10/06/2012  . CAD (coronary artery disease) 10/06/2012  . Chronic kidney disease 10/06/2012  . Anemia of chronic disease 10/06/2012  . Breast mass, right 10/06/2012  . History of tobacco abuse 10/06/2012  . COPD (chronic obstructive pulmonary disease) 09/27/2012  . DM2 (diabetes mellitus, type 2) 09/26/2012   Past Medical History: Past Medical History  Diagnosis Date  . Fibroid   . Morbid obesity   . 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 COPD (chronic obstructive pulmonary disease)   . Diastolic heart failure   . NSTEMI (non-ST elevated myocardial infarction)   . Anemia   . Breast cancer   . Ulcers of both lower legs   . Tobacco abuse   . Hyperlipidemia 02/11/2013  . Diabetes mellitus without complication    Past Surgical History: Past Surgical History  Procedure Laterality Date  . Colonoscopy, esophagogastroduodenoscopy (egd) and esophageal dilation N/A 10/13/2012    Procedure: colonoscopy and egd;  Surgeon: Graylin Shiver, MD;  Location: [facility] ENDOSCOPY;  Service: Endoscopy;  Laterality: N/A;   Social History: History  Substance Use Topics  . Smoking status: Former Smoker -- 2.00 packs/day for 40 years    Types: Cigarettes    Quit date: 09/24/2012  . Smokeless  tobacco: Never Used  . Alcohol Use: No   Additional social history:   Please also refer to relevant sections of EMR.  Family History: Family History  Problem Relation Age of Onset  . Cancer Mother     leukemia  . Hypertension Mother   . Diabetes Father   . Heart disease Father     passed away due to heart attack   Allergies and Medications: Allergies  Allergen Reactions  . Omnipaque [Iohexol]     Pt claims she developed hives after given contrast  . Benzene Rash   No current facility-administered medications on file prior to encounter.   Current Outpatient Prescriptions on File Prior to Encounter  Medication Sig Dispense Refill  . albuterol (PROVENTIL HFA;VENTOLIN HFA) 108 (90 BASE) MCG/ACT inhaler Inhale 1 puff into the lungs 2 (two) times daily as needed for wheezing or shortness of breath.      [name] aspirin EC 81 MG EC tablet Take 1 tablet (81 mg total) by mouth daily.  30 tablet  3  . atorvastatin (LIPITOR) 40 MG tablet Take 1 tablet (40 mg total) by mouth daily at 6 PM.  30 tablet  5  . Calcium Citrate (CALCITRATE PO) Take 1 tablet by mouth daily.       [name]  cephALEXin (KEFLEX) 500 MG capsule Take 1 capsule (500 mg total) by mouth 3 (three) times daily.  21 capsule  0  . docusate sodium (COLACE) 100 MG capsule Take 1 capsule (100 mg total) by mouth 2 (two) times daily as needed for constipation.  60 capsule  3  . isosorbide mononitrate (IMDUR) 30 MG 24 hr tablet Take 1 tablet (30 mg total) by mouth daily.  30 tablet  3  . letrozole (FEMARA) 2.5 MG tablet Take 1 tablet (2.5 mg total) by mouth daily.  30 tablet  1  . nitroGLYCERIN (NITROSTAT) 0.4 MG SL tablet Place 1 tablet (0.4 mg total) under the tongue every 5 (five) minutes as needed for chest pain.  30 tablet  0  . polyethylene glycol powder (GLYCOLAX/MIRALAX) powder Take 17 g by mouth daily as needed (Constipation).  3350 g  1  . Potassium Chloride (KLOR-CON PO) Take 20 mEq by mouth daily.       [name] tiotropium (SPIRIVA  HANDIHALER) 18 MCG inhalation capsule Place 1 capsule (18 mcg total) into inhaler and inhale daily.  30 capsule  6  . torsemide (DEMADEX) 10 MG tablet Take 1 tablet (10 mg total) by mouth as needed (for 2 pound weight gain per day).  30 tablet  0  . torsemide (DEMADEX) 20 MG tablet Take 2 tablets (40 mg total) by mouth daily.  60 tablet  0    Objective: BP 121/64  Pulse 107  Resp 26  Ht 5\' 2"  (1.575 m)  Wt 230 lb (104.327 kg)  BMI 42.06 kg/m2  SpO2 97%  Exam: General: morbidly obese, sitting down in a chair, mildly uncomfortable, in no acute distress HEENT: PEERL, oropharynx moist Cardiovascular: RRR, no murmurs, rubs or gallops Respiratory: decreased breath sounds most likely attributed to body habitus, slight crackles at bases, able to finish sentences but sounds out of breath Abdomen: soft, obese, non-tender Extremities: 1+ LE edema bilateral to knees, no calf tenderness Skin: no cyanosis Neuro: alert, oriented x3, no focal deficit  Labs and Imaging: CBC BMET   Recent Labs Lab 07/06/13 1242  WBC 9.6  HGB 10.8*  HCT 33.3*  PLT 259    Recent Labs Lab 07/06/13 1242  NA 141  K 3.1*  CL 102  CO2 24  BUN 17  CREATININE 0.90  GLUCOSE 219*  CALCIUM 9.8     Urinalysis    Component Value Date/Time   COLORURINE YELLOW 07/06/2013 1220   APPEARANCEUR CLOUDY* 07/06/2013 1220   LABSPEC 1.006 07/06/2013 1220   PHURINE 6.0 07/06/2013 1220   GLUCOSEU NEGATIVE 07/06/2013 1220   HGBUR SMALL* 07/06/2013 1220   BILIRUBINUR NEGATIVE 07/06/2013 1220   BILIRUBINUR NEG 05/22/2013 1532   KETONESUR NEGATIVE 07/06/2013 1220   PROTEINUR NEGATIVE 07/06/2013 1220   UROBILINOGEN 0.2 07/06/2013 1220   UROBILINOGEN 0.2 05/22/2013 1532   NITRITE POSITIVE* 07/06/2013 1220   NITRITE POSITIVE 05/22/2013 1532   LEUKOCYTESUR LARGE* 07/06/2013 1220   EKG: Tachycardia, regular rhythm, Sinus tachycardia Official read: Right axis deviation repol abnormality in diffuse leads Baseline wander  in lead(s) II III aVF repol abnormality is new since prior ekg of July 2014   Cardiac Panel (last 3 results)  Recent Labs  07/06/13 1243  TROPONINI 0.51*   Chest X-ray (11/10) FINDINGS:  Mild vascular congestion and peribronchial thickening. Mild  interstitial prominence, similar to prior study. Heart is borderline  in size. No confluent opacities or effusions. No acute bony  abnormality.  IMPRESSION:  Mild vascular congestion.  Jacquelin Hawking, MD 07/06/2013, 2:22 PM PGY-1, [facility] Family Medicine FPTS Intern pager: [phone], text pages welcome  Patient seen, examined. Available data reviewed. Agree with findings, assessment, and plan as outlined by Dr. Caleb Popp.  My additional findings are documented and highlighted above in blue.   Marena Chancy, PGY-3 Family Medicine Resident

## 2013-07-06 NOTE — Progress Notes (Signed)
ANTICOAGULATION CONSULT NOTE - Initial Consult  Pharmacy Consult for heparin Indication: chest pain/ACS  Allergies  Allergen Reactions  . Omnipaque [Iohexol]     Pt claims she developed hives after given contrast  . Benzene Rash    Patient Measurements: Height: 5\' 2"  (157.5 cm) Weight: 230 lb (104.327 kg) IBW/kg (Calculated) : 50.1 Heparin Dosing Weight: 75kg  Vital Signs: BP: 121/64 mmHg (11/10 1215) Pulse Rate: 107 (11/10 1215)  Labs:  Recent Labs  07/06/13 1242 07/06/13 1243  HGB 10.8*  --   HCT 33.3*  --   PLT 259  --   CREATININE 0.90  --   TROPONINI  --  0.51*    Estimated Creatinine Clearance: 69.7 ml/min (by C-G formula based on Cr of 0.9).   Medical History: Past Medical History  Diagnosis Date  . Fibroid   . Morbid obesity   . 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 COPD (chronic obstructive pulmonary disease)   . Diastolic heart failure   . NSTEMI (non-ST elevated myocardial infarction)   . Anemia   . Breast cancer   . Ulcers of both lower legs   . Tobacco abuse   . Hyperlipidemia 02/11/2013  . Diabetes mellitus without complication     Assessment: 30 YOF who was brought in to the ED with SOB and CP which did not improve with albuterol or Atrovent given by EMS. Initial troponin 0.51- to start heparin. On no anticoagulation PTA that is recorded. Hgb 10.8, plts 259. No bleeding noted.  Goal of Therapy:  Heparin level 0.3-0.7 units/ml Monitor platelets by anticoagulation protocol: Yes   Plan:  1. IV heparin bolus with 4000 units x1 2. Initiate heparin drip at 1000units/hr 3. Heparin level at 2100 4. Daily heparin level and CBC 5. Follow for cath plans, s/s bleeding, long-term anticoagulation plans if needed  Cam Harnden D. Bralyn Folkert, PharmD Clinical Pharmacist Pager: [phone] 07/06/2013 2:23 PM

## 2013-07-06 NOTE — ED Notes (Signed)
Pt assisted up to [facility] to void very sob on exertion pt on 4 l [place]  Voided ~ 600 cc cloudy yellow ua

## 2013-07-06 NOTE — Progress Notes (Signed)
Unit CM UR Completed by [facility] ED CM  W. Laria Grimmett RN  

## 2013-07-06 NOTE — ED Notes (Addendum)
Per dr Mal Misty hold levofloxin till he can check w/ his attending about givning it for uti

## 2013-07-06 NOTE — ED Notes (Signed)
Dr linker aware that pts tropoin is .51. Pt wanted to sit in chair because she was uncomfortable in bed so she was assisted up . Pt is on monitor foley draining cloudy light yellow ua

## 2013-07-06 NOTE — ED Notes (Signed)
Cp and sob this am started  On sat got worsrse this am pt given albuteral tx and atrovent via ems ,im solumedrol 125 mginitial sat 88 % on ra pt is on home o2

## 2013-07-06 NOTE — ED Provider Notes (Signed)
CSN: [id number]     Arrival date & time 07/06/13  1042 History   First MD Initiated Contact with Patient 07/06/13 1048     Chief Complaint  Patient presents with  . Shortness of Breath   (Consider location/radiation/quality/duration/timing/severity/associated sxs/prior Treatment) HPI Pt presents with c/o shortness of breath.  She has hx of COPD, CHF, DM, nstemi, metastatic breast cancer. She states she woke up this morning with shortness of breath.  Also had brief chest pain which resolved on its own. She took extra dose of diuretic before calling EMS.  No fever/chills.  No significant cough.  She was given albuterol/atrovent and solumedrol per EMS prior to arrival and states she is feeling improved.  She has noted having increased DOE even with taking 2-3 steps across a room. Symptoms are continuous.  There are no other associated systemic symptoms, there are no other alleviating or modifying factors.   Past Medical History  Diagnosis Date  . Fibroid   . Morbid obesity   . 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 COPD (chronic obstructive pulmonary disease)   . Diastolic heart failure   . NSTEMI (non-ST elevated myocardial infarction)   . Anemia   . Breast cancer   . Ulcers of both lower legs   . Tobacco abuse   . Hyperlipidemia 02/11/2013  . Diabetes mellitus without complication    Past Surgical History  Procedure Laterality Date  . Colonoscopy, esophagogastroduodenoscopy (egd) and esophageal dilation N/A 10/13/2012    Procedure: colonoscopy and egd;  Surgeon: Graylin Shiver, MD;  Location: [facility] ENDOSCOPY;  Service: Endoscopy;  Laterality: N/A;   Family History  Problem Relation Age of Onset  . Cancer Mother     leukemia  . Hypertension Mother   . Diabetes Father   . Heart disease Father     passed away due to heart attack   History  Substance Use Topics  . Smoking status: Former Smoker -- 2.00 packs/day for 40 years    Types: Cigarettes     Quit date: 09/24/2012  . Smokeless tobacco: Never Used  . Alcohol Use: No   OB History   Grav Para Term Preterm Abortions TAB SAB Ect Mult Living   2 2 2       2      Review of Systems ROS reviewed and all otherwise negative except for mentioned in HPI  Allergies  Omnipaque and Benzene  Home Medications   Current Outpatient Rx  Name  Route  Sig  Dispense  Refill  . albuterol (PROVENTIL HFA;VENTOLIN HFA) 108 (90 BASE) MCG/ACT inhaler   Inhalation   Inhale 1 puff into the lungs 2 (two) times daily as needed for wheezing or shortness of breath.         [name] aspirin EC 81 MG EC tablet   Oral   Take 1 tablet (81 mg total) by mouth daily.   30 tablet   3   . atorvastatin (LIPITOR) 40 MG tablet   Oral   Take 1 tablet (40 mg total) by mouth daily at 6 PM.   30 tablet   5   . Calcium Citrate (CALCITRATE PO)   Oral   Take 1 tablet by mouth daily.          [name] docusate sodium (COLACE) 100 MG capsule   Oral   Take 1 capsule (100 mg total) by mouth 2 (two) times daily as needed for constipation.  60 capsule   3   . isosorbide mononitrate (IMDUR) 30 MG 24 hr tablet   Oral   Take 30 mg by mouth daily.         [name] letrozole (FEMARA) 2.5 MG tablet   Oral   Take 1 tablet (2.5 mg total) by mouth daily.   30 tablet   1   . tiotropium (SPIRIVA HANDIHALER) 18 MCG inhalation capsule   Inhalation   Place 1 capsule (18 mcg total) into inhaler and inhale daily.   30 capsule   6   . torsemide (DEMADEX) 10 MG tablet   Oral   Take 10 mg by mouth daily.         [name] torsemide (DEMADEX) 20 MG tablet   Oral   Take 2 tablets (40 mg total) by mouth daily.   60 tablet   0   . nitroGLYCERIN (NITROSTAT) 0.4 MG SL tablet   Sublingual   Place 1 tablet (0.4 mg total) under the tongue every 5 (five) minutes as needed for chest pain.   30 tablet   0     If patient has to use tablets, she should be seen  ...   . Potassium Chloride (KLOR-CON PO)   Oral   Take 20 mEq by mouth  daily.           BP 121/64  Pulse 107  Resp 26  Ht 5\' 2"  (1.575 m)  Wt 230 lb (104.327 kg)  BMI 42.06 kg/m2  SpO2 97% Vitals reviewed Physical Exam Physical Examination: General appearance - alert, well appearing, and in no distress Mental status - alert, oriented to person, place, and time Eyes - no conjunctival injection, no scleral icterus Mouth - mucous membranes moist, pharynx normal without lesions Neck - supple, no significant adenopathy Chest - clear to auscultation, no wheezes, rales or rhonchi, symmetric air entry Heart - normal rate, regular rhythm, normal S1, S2, no murmurs, rubs, clicks or gallops Abdomen - soft, nontender, nondistended, no masses or organomegaly Extremities - peripheral pulses normal, no pedal edema, no clubbing or cyanosis Skin - normal coloration and turgor, no rashes  ED Course  Procedures (including critical care time)  2:18 PM d/w both cardiology- who will consult on patient in the ED, as well as Family Practice resident (this is her PMD) for admission.   CRITICAL CARE Performed by: Ethelda Chick Total critical care time: 35 Critical care time was exclusive of separately billable procedures and treating other patients. Critical care was necessary to treat or prevent imminent or life-threatening deterioration. Critical care was time spent personally by me on the following activities: development of treatment plan with patient and/or surrogate as well as nursing, discussions with consultants, evaluation of patient's response to treatment, examination of patient, obtaining history from patient or surrogate, ordering and performing treatments and interventions, ordering and review of laboratory studies, ordering and review of radiographic studies, pulse oximetry and re-evaluation of patient's condition. Labs Review Labs Reviewed  CBC - Abnormal; Notable for the following:    Hemoglobin 10.8 (*)    HCT 33.3 (*)    MCV 76.6 (*)    MCH 24.8 (*)     RDW 18.3 (*)    All other components within normal limits  BASIC METABOLIC PANEL - Abnormal; Notable for the following:    Potassium 3.1 (*)    Glucose, Bld 219 (*)    GFR calc non Af Amer 65 (*)    GFR calc Af Amer 76 (*)  All other components within normal limits  PRO B NATRIURETIC PEPTIDE - Abnormal; Notable for the following:    Pro B Natriuretic peptide (BNP) 1148.0 (*)    All other components within normal limits  TROPONIN I - Abnormal; Notable for the following:    Troponin I 0.51 (*)    All other components within normal limits  URINALYSIS, ROUTINE W REFLEX MICROSCOPIC - Abnormal; Notable for the following:    APPearance CLOUDY (*)    Hgb urine dipstick SMALL (*)    Nitrite POSITIVE (*)    Leukocytes, UA LARGE (*)    All other components within normal limits  URINE MICROSCOPIC-ADD ON - Abnormal; Notable for the following:    Bacteria, UA MANY (*)    All other components within normal limits  URINE CULTURE  HEPARIN LEVEL (UNFRACTIONATED)   Imaging Review Dg Chest 2 View  07/06/2013   CLINICAL DATA:  Shortness of breath, weakness.  EXAM: CHEST  2 VIEW  COMPARISON:  03/13/2013  FINDINGS: Mild vascular congestion and peribronchial thickening. Mild interstitial prominence, similar to prior study. Heart is borderline in size. No confluent opacities or effusions. No acute bony abnormality.  IMPRESSION: Mild vascular congestion.  Interstitial prominence and peribronchial thickening, likely bronchitis. No definite active or acute process.   Electronically Signed   By: Charlett Nose M.D.   On: 07/06/2013 13:10    EKG Interpretation     Ventricular Rate:  114 PR Interval:  183 QRS Duration: 90 QT Interval:  354 QTC Calculation: 487 R Axis:   100 Text Interpretation:  Sinus tachycardia Right axis deviation repol abnormality in diffuse leads Baseline wander in lead(s) II III aVF repol abnormality is new since prior ekg of  July 2014            MDM   1. COPD  exacerbation   2. UTI (lower urinary tract infection)   3. Elevated troponin   4. Hypokalemia   5. Hyperglycemia    Pt presenting with shortness of breath and wheezing- improving after albuterol/atrovent, has received steroids and levofloxacin.  EKG shows some st/t wave changes- troponin elevated at 0.5.  Pt treated with aspirin and started on heparin - cardiology will consult in the ED.  This may represent nstemi, but she also is having COPD exacerbation, UTI.  D/w family practice who will see patient for admission.     Ethelda Chick, MD 07/06/13 1520

## 2013-07-06 NOTE — ED Notes (Signed)
Pt continues to sit in chair at side of bed for comfort.  Family at bedside.  Heparin infusing.  Foley cath draining without any problems.

## 2013-07-07 ENCOUNTER — Ambulatory Visit (HOSPITAL_COMMUNITY): Payer: Medicare HMO

## 2013-07-07 ENCOUNTER — Telehealth: Payer: Self-pay | Admitting: Cardiology

## 2013-07-07 ENCOUNTER — Encounter (HOSPITAL_COMMUNITY)
Admission: EM | Disposition: A | Discharge status: Home / Self Care | Payer: Self-pay | Source: Home / Self Care | Attending: Family Medicine

## 2013-07-07 ENCOUNTER — Telehealth: Payer: Self-pay | Admitting: Family Medicine

## 2013-07-07 ENCOUNTER — Telehealth: Payer: Self-pay | Admitting: Pulmonary Disease

## 2013-07-07 DIAGNOSIS — E119 Type 2 diabetes mellitus without complications: Secondary | ICD-10-CM

## 2013-07-07 DIAGNOSIS — D638 Anemia in other chronic diseases classified elsewhere: Secondary | ICD-10-CM

## 2013-07-07 DIAGNOSIS — C50919 Malignant neoplasm of unspecified site of unspecified female breast: Secondary | ICD-10-CM

## 2013-07-07 DIAGNOSIS — D491 Neoplasm of unspecified behavior of respiratory system: Secondary | ICD-10-CM

## 2013-07-07 DIAGNOSIS — C7800 Secondary malignant neoplasm of unspecified lung: Secondary | ICD-10-CM

## 2013-07-07 DIAGNOSIS — C7951 Secondary malignant neoplasm of bone: Secondary | ICD-10-CM

## 2013-07-07 DIAGNOSIS — I214 Non-ST elevation (NSTEMI) myocardial infarction: Secondary | ICD-10-CM | POA: Diagnosis present

## 2013-07-07 DIAGNOSIS — I5032 Chronic diastolic (congestive) heart failure: Secondary | ICD-10-CM

## 2013-07-07 DIAGNOSIS — I2510 Atherosclerotic heart disease of native coronary artery without angina pectoris: Secondary | ICD-10-CM

## 2013-07-07 DIAGNOSIS — J449 Chronic obstructive pulmonary disease, unspecified: Secondary | ICD-10-CM

## 2013-07-07 HISTORY — PX: LEFT HEART CATHETERIZATION WITH CORONARY ANGIOGRAM: SHX5451

## 2013-07-07 LAB — CBC
HCT: 34.4 % — ABNORMAL LOW (ref 36.0–46.0)
MCH: 24.9 pg — ABNORMAL LOW (ref 26.0–34.0)
MCHC: 32.8 g/dL (ref 30.0–36.0)
MCV: 75.8 fL — ABNORMAL LOW (ref 78.0–100.0)
Platelets: 301 10*3/uL (ref 150–400)
RBC: 4.54 MIL/uL (ref 3.87–5.11)
RDW: 18.6 % — ABNORMAL HIGH (ref 11.5–15.5)

## 2013-07-07 LAB — HEPARIN LEVEL (UNFRACTIONATED): Heparin Unfractionated: 0.18 IU/mL — ABNORMAL LOW (ref 0.30–0.70)

## 2013-07-07 LAB — PROTIME-INR: INR: 0.97 (ref 0.00–1.49)

## 2013-07-07 LAB — BASIC METABOLIC PANEL
BUN: 29 mg/dL — ABNORMAL HIGH (ref 6–23)
Calcium: 10 mg/dL (ref 8.4–10.5)
Creatinine, Ser: 1.02 mg/dL (ref 0.50–1.10)
GFR calc Af Amer: 65 mL/min — ABNORMAL LOW (ref >90)
GFR calc non Af Amer: 56 mL/min — ABNORMAL LOW (ref >90)

## 2013-07-07 LAB — GLUCOSE, CAPILLARY: Glucose-Capillary: 148 mg/dL — ABNORMAL HIGH (ref 70–99)

## 2013-07-07 LAB — TROPONIN I
Troponin I: 3.37 ng/mL (ref <0.30)
Troponin I: 3.07 ng/mL (ref <0.30)

## 2013-07-07 SURGERY — LEFT HEART CATHETERIZATION WITH CORONARY ANGIOGRAM
Anesthesia: LOCAL

## 2013-07-07 MED ORDER — METHYLPREDNISOLONE SODIUM SUCC 125 MG IJ SOLR
INTRAMUSCULAR | Status: AC
  Filled 2013-07-07: qty 2

## 2013-07-07 MED ORDER — CLOPIDOGREL BISULFATE 300 MG PO TABS
300.0000 mg | ORAL_TABLET | Freq: Once | ORAL | Status: AC
  Administered 2013-07-07: 17:00:00 300 mg via ORAL
  Filled 2013-07-07: qty 1

## 2013-07-07 MED ORDER — ASPIRIN 81 MG PO CHEW
81.0000 mg | CHEWABLE_TABLET | Freq: Once | ORAL | Status: AC
  Administered 2013-07-07: 16:00:00 81 mg via ORAL
  Filled 2013-07-07: qty 1

## 2013-07-07 MED ORDER — VERAPAMIL HCL 2.5 MG/ML IV SOLN
INTRAVENOUS | Status: AC
  Filled 2013-07-07: qty 2

## 2013-07-07 MED ORDER — METHYLPREDNISOLONE SODIUM SUCC 125 MG IJ SOLR
125.0000 mg | Freq: Once | INTRAMUSCULAR | Status: DC
  Filled 2013-07-07: qty 2

## 2013-07-07 MED ORDER — POTASSIUM CHLORIDE CRYS ER 20 MEQ PO TBCR
40.0000 meq | EXTENDED_RELEASE_TABLET | Freq: Once | ORAL | Status: AC
  Administered 2013-07-07: 14:00:00 40 meq via ORAL
  Filled 2013-07-07: qty 2

## 2013-07-07 MED ORDER — ALUM & MAG HYDROXIDE-SIMETH 200-200-20 MG/5ML PO SUSP
30.0000 mL | Freq: Four times a day (QID) | ORAL | Status: DC | PRN
  Administered 2013-07-07: 30 mL via ORAL
  Filled 2013-07-07: qty 30

## 2013-07-07 MED ORDER — BISOPROLOL FUMARATE 5 MG PO TABS
5.0000 mg | ORAL_TABLET | Freq: Every day | ORAL | Status: DC
  Administered 2013-07-07 – 2013-07-09 (×3): 5 mg via ORAL
  Filled 2013-07-07 (×3): qty 1

## 2013-07-07 MED ORDER — HEPARIN SODIUM (PORCINE) 5000 UNIT/ML IJ SOLN
5000.0000 [IU] | Freq: Three times a day (TID) | INTRAMUSCULAR | Status: DC
  Administered 2013-07-07 – 2013-07-08 (×3): 5000 [IU] via SUBCUTANEOUS
  Filled 2013-07-07 (×8): qty 1

## 2013-07-07 MED ORDER — HEPARIN (PORCINE) IN NACL 2-0.9 UNIT/ML-% IJ SOLN
INTRAMUSCULAR | Status: AC
  Filled 2013-07-07: qty 1000

## 2013-07-07 MED ORDER — HEPARIN SODIUM (PORCINE) 1000 UNIT/ML IJ SOLN
INTRAMUSCULAR | Status: AC
  Filled 2013-07-07: qty 1

## 2013-07-07 MED ORDER — NITROGLYCERIN 0.2 MG/ML ON CALL CATH LAB
INTRAVENOUS | Status: AC
  Filled 2013-07-07: qty 1

## 2013-07-07 MED ORDER — FUROSEMIDE 10 MG/ML IJ SOLN
40.0000 mg | Freq: Two times a day (BID) | INTRAMUSCULAR | Status: DC
  Administered 2013-07-07 – 2013-07-09 (×5): 40 mg via INTRAVENOUS
  Filled 2013-07-07 (×6): qty 4

## 2013-07-07 MED ORDER — FAMOTIDINE IN NACL 20-0.9 MG/50ML-% IV SOLN
INTRAVENOUS | Status: AC
  Filled 2013-07-07: qty 50

## 2013-07-07 MED ORDER — HEPARIN BOLUS VIA INFUSION
2000.0000 [IU] | Freq: Once | INTRAVENOUS | Status: AC
  Administered 2013-07-07: 01:00:00 2000 [IU] via INTRAVENOUS
  Filled 2013-07-07: qty 2000

## 2013-07-07 MED ORDER — LIDOCAINE HCL (PF) 1 % IJ SOLN
INTRAMUSCULAR | Status: AC
  Filled 2013-07-07: qty 30

## 2013-07-07 MED ORDER — ISOSORBIDE MONONITRATE ER 60 MG PO TB24
60.0000 mg | ORAL_TABLET | Freq: Every day | ORAL | Status: DC
  Administered 2013-07-08 – 2013-07-09 (×2): 60 mg via ORAL
  Filled 2013-07-07 (×2): qty 1

## 2013-07-07 MED ORDER — DIPHENHYDRAMINE HCL 50 MG/ML IJ SOLN
INTRAMUSCULAR | Status: AC
  Filled 2013-07-07: qty 1

## 2013-07-07 MED ORDER — BIOTENE DRY MOUTH MT LIQD
15.0000 mL | Freq: Two times a day (BID) | OROMUCOSAL | Status: DC
  Administered 2013-07-07 – 2013-07-09 (×5): 15 mL via OROMUCOSAL

## 2013-07-07 MED ORDER — SODIUM CHLORIDE 0.9 % IV SOLN
INTRAVENOUS | Status: AC
  Administered 2013-07-07: 13:00:00 via INTRAVENOUS

## 2013-07-07 MED ORDER — CLOPIDOGREL BISULFATE 75 MG PO TABS
75.0000 mg | ORAL_TABLET | Freq: Every day | ORAL | Status: DC
  Administered 2013-07-08 – 2013-07-09 (×2): 75 mg via ORAL
  Filled 2013-07-07 (×3): qty 1

## 2013-07-07 MED ORDER — NYSTATIN 100000 UNIT/GM EX POWD
Freq: Two times a day (BID) | CUTANEOUS | Status: DC
  Administered 2013-07-07 – 2013-07-09 (×4): via TOPICAL
  Filled 2013-07-07: qty 15

## 2013-07-07 NOTE — Telephone Encounter (Signed)
New Problem:  Pt is calling with the question.... Why does she burp when she drinks? Please advise

## 2013-07-07 NOTE — Progress Notes (Signed)
Pt c/o of CP and belching, VS WNL.  PA notified and orders put in for mylanta.  Will carry out MD orders and continue to monitor.

## 2013-07-07 NOTE — Progress Notes (Signed)
The patient's troponin levels trended up overnight.  FMTS on-call notified.  The patient does not have any complaints of pain at this time and her vital signs are stable.  The RN will continue to monitor the patient.

## 2013-07-07 NOTE — Progress Notes (Signed)
ANTICOAGULATION CONSULT NOTE - Follow Up Consult  Pharmacy Consult for heparin Indication: NSTEMI  Labs:  Recent Labs  07/06/13 1242 07/06/13 1243 07/06/13 2115  HGB 10.8*  --   --   HCT 33.3*  --   --   PLT 259  --   --   LABPROT  --   --  12.7  INR  --   --  0.97  HEPARINUNFRC  --   --  0.18*  CREATININE 0.90  --   --   TROPONINI  --  0.51* 3.32*    Assessment: 67yo female subtherapeutic on heparin with initial dosing for NSTEMI.  Goal of Therapy:  Heparin level 0.3-0.7 units/ml   Plan:  Will rebolus with 2000 units of heparin and increase gtt by 3 units/kg/hr to 1300 units/hr and check level at time of next troponin draw.  Vernard Gambles, PharmD, BCPS  07/07/2013,12:39 AM

## 2013-07-07 NOTE — CV Procedure (Signed)
    Cardiac Catheterization Procedure Note  Name: Anita [name] MRN: [id number] DOB: [birth year]  Procedure: Left Heart Cath, Selective Coronary Angiography, LV angiography  Indication: 66 yo WF with multiple medical problems including metastatic breast CA, vocal cord mass, COPD, DM, CHF, and hyperlipidemia presents with worsening dyspnea. She has ruled in for NSTEMI.   Procedural Details: The right wrist was prepped, draped, and anesthetized with 1% lidocaine. Using the modified Seldinger technique, a 5 French sheath was introduced into the right radial artery. 3 mg of verapamil was administered through the sheath, weight-based unfractionated heparin was administered intravenously. Standard Judkins catheters were used for selective coronary angiography and left ventriculography. Catheter exchanges were performed over an exchange length guidewire. There were no immediate procedural complications. A TR band was used for radial hemostasis at the completion of the procedure.  The patient was transferred to the post catheterization recovery area for further monitoring.  Procedural Findings: Hemodynamics: AO 131/75 mean 100 mm Hg LV 132/36 mm Hg  Coronary angiography: Coronary dominance: right  Left mainstem: Normal  Left anterior descending (LAD): Large vessel. There is 40% disease prior to the first diagonal with focal 80-85% disease immediately after the first diagonal. The first diagonal is a moderate size vessel that bifurcates. It has no significant disease.  Left circumflex (LCx): The LCx gives rise to a single large OM. There is 20% disease in the proximal LCx.   Right coronary artery (RCA): The RCA is diffusely and severely diseased in the mid vessel. It is occluded distally with left to right collaterals.  Left ventriculography: Left ventricular systolic function is abnormal, LVEF is estimated at 30-35%, There is akinesis of the basal to mid inferior wall and severe hypokinesis of the  anterior wall, there is no significant mitral regurgitation   Final Conclusions:   1. Severe 2 vessel obstructive CAD. The RCA is chronically occluded distally with collaterals. There is a moderate to severe stenosis in the LAD. 2. Severe LV dysfunction with high filling pressures.  Recommendations: Aggressive medical management of CAD and CHF. Needs IV diuresis. Will increase nitrates and add low dose bisoprolol. Add ACEi if renal function stable. Could consider stenting of the LAD if the patient remains symptomatic despite optimal medical therapy.  Theron Arista [facility] 07/07/2013, 11:21 AM

## 2013-07-07 NOTE — Progress Notes (Signed)
Patient seen at bedside. Discussed optimization of medical regimen. Patient unsure what she wants to do regarding cancer treatment.  If I can be of any assistance please let me know.  Everlene Other DO Family Medicine PGY-2

## 2013-07-07 NOTE — Progress Notes (Signed)
Anita [name]   DOB:[birth year]   ZO#:[id number]   JWJ#:[id number]  Subjective: Patient up in chair eating dinner this evening. She complains of frequent belching but denies chest pain.  She also denies any bone pain related to her cancer.  She reports significant weight lost secondary to diuresis.    Objective:  Filed Vitals:   07/07/13 1417  BP: 137/86  Pulse:   Temp:   Resp:     Body mass index is 44.47 kg/(m^2).  Intake/Output Summary (Last 24 hours) at 07/07/13 2044 Last data filed at 07/07/13 1851  Gross per 24 hour  Intake    240 ml  Output   4250 ml  Net  -4010 ml    Chronically ill appearing. Moderately obese.  Sclerae unicteric  Oropharynx clear  Lungs clear -- no rales or rhonchi  Heart regular rate and rhythm  Abdomen benign  MSK  no peripheral edema  Neuro nonfocal  Breast exam: Deferred  CBG (last 3)   Recent Labs  07/07/13 1133  GLUCAP 148*    Labs:  Lab Results  Component Value Date   WBC 13.6* 07/07/2013   HGB 11.3* 07/07/2013   HCT 34.4* 07/07/2013   MCV 75.8* 07/07/2013   PLT 301 07/07/2013   NEUTROABS 11.5* 05/13/2013   Basic Metabolic Panel:  Recent Labs Lab 07/06/13 1242 07/07/13 0807  NA 141 140  K 3.1* 3.4*  CL 102 99  CO2 24 26  GLUCOSE 219* 208*  BUN 17 29*  CREATININE 0.90 1.02  CALCIUM 9.8 10.0   Coagulation profile  Recent Labs Lab 07/06/13 2115  INR 0.97    CBC:  Recent Labs Lab 07/06/13 1242 07/07/13 0807  WBC 9.6 13.6*  HGB 10.8* 11.3*  HCT 33.3* 34.4*  MCV 76.6* 75.8*  PLT 259 301   Cardiac Enzymes:  Recent Labs Lab 07/06/13 1243 07/06/13 2115 07/07/13 0202 07/07/13 0809  TROPONINI 0.51* 3.32* 3.37* 3.07*   CBG:  Recent Labs Lab 07/07/13 1133  GLUCAP 148*   Microbiology Recent Results (from the past 240 hour(s))  URINE CULTURE     Status: None   Collection Time    07/06/13 12:20 PM      Result Value Range Status   Specimen Description URINE, RANDOM   Final   Special Requests NONE    Final   Culture  Setup Time     Final   Value: 07/06/2013 14:10     Performed at [facility]   Culture     Final   Value: Culture reincubated for better growth     Performed at [facility]   Report Status PENDING   Incomplete  MRSA PCR SCREENING     Status: None   Collection Time    07/07/13  4:36 AM      Result Value Range Status   MRSA by PCR NEGATIVE  NEGATIVE Final   Comment:            The GeneXpert MRSA Assay (FDA     approved for NASAL specimens     only), is one component of a     comprehensive MRSA colonization     surveillance program. It is not     intended to diagnose MRSA     infection nor to guide or     monitor treatment for     MRSA infections.    Studies:  Dg Chest 2 View  07/06/2013   CLINICAL DATA:  Shortness of breath, weakness.  EXAM: CHEST  2 VIEW  COMPARISON:  03/13/2013  FINDINGS: Mild vascular congestion and peribronchial thickening. Mild interstitial prominence, similar to prior study. Heart is borderline in size. No confluent opacities or effusions. No acute bony abnormality.  IMPRESSION: Mild vascular congestion.  Interstitial prominence and peribronchial thickening, likely bronchitis. No definite active or acute process.   Electronically Signed   By: Charlett Nose M.D.   On: 07/06/2013 13:10   CT of Neck (06/24/2013) IMPRESSION:  1. Focal nodularity along the anterior aspect of the right true  vocal cord with possible extension into the right thyroid cartilage measures 6 mm maximally. This is concerning for invasive tumor. 2. Submucosal fullness within the left false vocal cord and true vocal cord. This is nonspecific. Neoplasm is not excluded. 3. Indeterminate right 15 mm level 3 lymph node could represent metastatic disease. The node remains ovoid, but is somewhat hyperdense. 4. Spondylosis of the cervical spine. 5. Diffuse thyroid goiter with substernal extension from the inferior left lobe.  6. 1.7 cm nodule at the upper pole of the  left thyroid lobe.  Consider further evaluation with thyroid ultrasound. If patient is clinically hyperthyroid, consider nuclear medicine thyroid uptake and scan.  Assessment: 67 y.o. 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  RECOMMENDATIONS:   1. Metastatic breast cancer (ER pos/PR pos/ HER2 neg) with spread to bone, lungs. Goal of care has been palliative and she is aware that her disease is not curable.  Clinically, her response has been stable. However, her CT of Chest (05/13/13) did show a new lung nodule. We were planning to repeat scans and three months.   She is tolerating letrozole well without bone/muscle pain, hot flash/night sweat. She is taking Calcium to decrease risk of osteoporosis. She has declined zometa or denosumab for her bone disease because she reports she did not have pain.   We discussed continuing letrozole for now and she was agreeable.  We also discussed that she is not a candidate presently for chemotherapy and the possibility of referral to radiation oncology for palliative XRT should develop bone pain.  Currently, she reports being pain free.   2. Invasive vocal cord tumor. Patient desires to recuperate from her current NSTEMI +/- COPD and to continue discussion with ENT regarding biopsy.  She has not determined if she wants more invasive procedures in the context of her current Stage IV breast cancer.   She is unsure if she will desire any treatment should it be determined that it is a separate malignancy.    3. Code status.  We dicussed her code status and she requested to remain full code.    4. Anemia of chronic disease: Past work up for reversible causes of anemia were negative.  Transfuse based on symptoms of anemia in the context of her fluid status.  We will follow Ms. Fister with you.    Evertt Chouinard, MD 07/07/2013  8:44 PM

## 2013-07-07 NOTE — Progress Notes (Signed)
The patient's potassium was 3.1.  FMTS on-call notified.  New orders given to administer 40 meq of potassium.  The RN will carry out the orders.

## 2013-07-07 NOTE — Telephone Encounter (Signed)
Spoke with patient who states she belched very badly earlier today after drinking ginger ale and tea.  Patient states someone told her to drink Mylanta and she did; patient states she feels better now.  I advised patient to drink slowly, that drinking too fast can cause her to swallow air.  Patient verbalized understanding and thanked me for my help.

## 2013-07-07 NOTE — Progress Notes (Signed)
ANTICOAGULATION CONSULT NOTE - Follow Up  Pharmacy Consult for heparin Indication: chest pain/ACS  Allergies  Allergen Reactions  . Omnipaque [Iohexol]     Pt claims she developed hives after given contrast  . Benzene Rash   Patient Measurements: Height: 5\' 1"  (154.9 cm) Weight: 235 lb 3.7 oz (106.7 kg) (scale C) IBW/kg (Calculated) : 47.8 Heparin Dosing Weight: 75kg  Vital Signs: Temp: 98.4 F (36.9 C) (11/11 0908) Temp src: Oral (11/11 0908) BP: 106/54 mmHg (11/11 0908) Pulse Rate: 84 (11/11 0908)  Labs:  Recent Labs  07/06/13 1242  07/06/13 2115 07/07/13 0202 07/07/13 0807 07/07/13 0809  HGB 10.8*  --   --   --  11.3*  --   HCT 33.3*  --   --   --  34.4*  --   PLT 259  --   --   --  301  --   LABPROT  --   --  12.7  --   --   --   INR  --   --  0.97  --   --   --   HEPARINUNFRC  --   --  0.18*  --  0.24*  --   CREATININE 0.90  --   --   --  1.02  --   TROPONINI  --   < > 3.32* 3.Anita*  --  3.07*  < > = values in this interval not displayed.  Estimated Creatinine Clearance: 61.2 ml/min (by C-G formula based on Cr of 1.02).  Medical History: Past Medical History  Diagnosis Date  . Fibroid   . Morbid obesity   . 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 COPD (chronic obstructive pulmonary disease)   . Chronic diastolic CHF (congestive heart failure)   . NSTEMI (non-ST elevated myocardial infarction)     a. NSTEMI 08/2012: med rx; nuc 10/2012: low risk (old scar, no significant ischemia, EF 49% with inf wall HK). b. NSTEMI 02/2013: managed med, pt declined cath at that time.  . Anemia     a. Adm 09/2012 with melena, Hgb 5.8 -> transfused. EGD/colonoscopy unrevealing.  . Breast cancer     a. Mets to bone. ER 100%/ PR 0%/Her2 neu negative.  [name] Ulcers of both lower legs   . Tobacco abuse   . Hyperlipidemia 02/11/2013  . Diabetes mellitus without complication   . Chronic respiratory failure     a. On O2 qhs. also portable O2.  .  Chronic renal insufficiency   . Lesion of vocal cord     a. CT 05/2013 concerning for tumor.   Assessment: Anita [name] who was brought in to the ED with SOB and CP which did not improve with albuterol or Atrovent given by EMS. Initial troponin 0.51 and trending higher.  Plan is to take to the cath lab for complete evaluation.  Her IV heparin level this morning was 0.24 IU/ml on IV heparin rate of 1300 units/hr.  She is near goal range with therapy without noted bleeding complications with a stable CBC this AM.  Goal of Therapy:  Heparin level 0.3-0.7 units/ml Monitor platelets by anticoagulation protocol: Yes   Plan:  1. F/U after cath for plans.  Nadara Mustard, PharmD., MS Clinical Pharmacist Pager:  [phone] Thank you for allowing pharmacy to be part of this patients care team. 07/07/2013 10:10 AM

## 2013-07-07 NOTE — Telephone Encounter (Signed)
Please advise. Bentley Fissel S  

## 2013-07-07 NOTE — Progress Notes (Signed)
The patient has wounds in her right groin area.  FMTS on-call alerted to these wounds.  The Foley catheter was left in due to diuresis and these wounds.

## 2013-07-07 NOTE — Progress Notes (Signed)
Pt back from cath lab radial site assessed with nurse, level 0.  Pt A&O and placed on sequenced VS machine.  Pt given a lunch tray, will carry out MD orders and continue to monitor.

## 2013-07-07 NOTE — Progress Notes (Signed)
SUBJECTIVE:  Breathing better.  No pain   PHYSICAL EXAM Filed Vitals:   07/06/13 1834 07/06/13 2140 07/07/13 0152 07/07/13 0551  BP: 128/67 124/65 131/58 111/61  Pulse: 108 104 107 97  Temp: 98 F (36.7 C) 97.8 F (36.6 C) 98.4 F (36.9 C) 98.1 F (36.7 C)  TempSrc: Oral Oral Oral Oral  Resp: 22 20 20 19   Height: 5\' 1"  (1.549 m)     Weight: 239 lb 6.4 oz (108.591 kg)   235 lb 3.7 oz (106.7 kg)  SpO2:  98% 98% 99%   General:  No distress Lungs:  Clear Heart:  RRR Abdomen:  Positive bowel sounds, no rebound no guarding Extremities:  Trace edema  LABS: Lab Results  Component Value Date   TROPONINI 3.37* 07/07/2013   Results for orders placed during the hospital encounter of 07/06/13 (from the past 24 hour(s))  URINALYSIS, ROUTINE W REFLEX MICROSCOPIC     Status: Abnormal   Collection Time    07/06/13 12:20 PM      Result Value Range   Color, Urine YELLOW  YELLOW   APPearance CLOUDY (*) CLEAR   Specific Gravity, Urine 1.006  1.005 - 1.030   pH 6.0  5.0 - 8.0   Glucose, UA NEGATIVE  NEGATIVE mg/dL   Hgb urine dipstick SMALL (*) NEGATIVE   Bilirubin Urine NEGATIVE  NEGATIVE   Ketones, ur NEGATIVE  NEGATIVE mg/dL   Protein, ur NEGATIVE  NEGATIVE mg/dL   Urobilinogen, UA 0.2  0.0 - 1.0 mg/dL   Nitrite POSITIVE (*) NEGATIVE   Leukocytes, UA LARGE (*) NEGATIVE  URINE MICROSCOPIC-ADD ON     Status: Abnormal   Collection Time    07/06/13 12:20 PM      Result Value Range   Squamous Epithelial / LPF RARE  RARE   WBC, UA TOO NUMEROUS TO COUNT  <3 WBC/hpf   RBC / HPF 0-2  <3 RBC/hpf   Bacteria, UA MANY (*) RARE  CBC     Status: Abnormal   Collection Time    07/06/13 12:42 PM      Result Value Range   WBC 9.6  4.0 - 10.5 K/uL   RBC 4.35  3.87 - 5.11 MIL/uL   Hemoglobin 10.8 (*) 12.0 - 15.0 g/dL   HCT 95.6 (*) 21.3 - 08.6 %   MCV 76.6 (*) 78.0 - 100.0 fL   MCH 24.8 (*) 26.0 - 34.0 pg   MCHC 32.4  30.0 - 36.0 g/dL   RDW 57.8 (*) 46.9 - 62.9 %   Platelets 259  150 -  400 K/uL  BASIC METABOLIC PANEL     Status: Abnormal   Collection Time    07/06/13 12:42 PM      Result Value Range   Sodium 141  135 - 145 mEq/L   Potassium 3.1 (*) 3.5 - 5.1 mEq/L   Chloride 102  96 - 112 mEq/L   CO2 24  19 - 32 mEq/L   Glucose, Bld 219 (*) 70 - 99 mg/dL   BUN 17  6 - 23 mg/dL   Creatinine, Ser 5.28  0.50 - 1.10 mg/dL   Calcium 9.8  8.4 - 41.3 mg/dL   GFR calc non Af Amer 65 (*) >90 mL/min   GFR calc Af Amer 76 (*) >90 mL/min  PRO B NATRIURETIC PEPTIDE     Status: Abnormal   Collection Time    07/06/13 12:43 PM      Result Value Range  Pro B Natriuretic peptide (BNP) 1148.0 (*) 0 - 125 pg/mL  TROPONIN I     Status: Abnormal   Collection Time    07/06/13 12:43 PM      Result Value Range   Troponin I 0.51 (*) <0.30 ng/mL  HEPARIN LEVEL (UNFRACTIONATED)     Status: Abnormal   Collection Time    07/06/13  9:15 PM      Result Value Range   Heparin Unfractionated 0.18 (*) 0.30 - 0.70 IU/mL  PROTIME-INR     Status: None   Collection Time    07/06/13  9:15 PM      Result Value Range   Prothrombin Time 12.7  11.6 - 15.2 seconds   INR 0.97  0.00 - 1.49  TROPONIN I     Status: Abnormal   Collection Time    07/06/13  9:15 PM      Result Value Range   Troponin I 3.32 (*) <0.30 ng/mL  TROPONIN I     Status: Abnormal   Collection Time    07/07/13  2:02 AM      Result Value Range   Troponin I 3.37 (*) <0.30 ng/mL  MRSA PCR SCREENING     Status: None   Collection Time    07/07/13  4:36 AM      Result Value Range   MRSA by PCR NEGATIVE  NEGATIVE    Intake/Output Summary (Last 24 hours) at 07/07/13 0647 Last data filed at 07/07/13 0552  Gross per 24 hour  Intake      3 ml  Output   3800 ml  Net  -3797 ml    EKG:  NSR, rate 94, no acute ST T wave changes. 07/07/2013  ASSESSMENT AND PLAN:  ACUTE RESPIRATORY FAILURE:  Good urine output.  Breathing better.  Continue pulmonary meds.  Hold diuretic until after the procedure  NQWMI:  I agree with cath to  see if there is a treatable culprit lesion.  She will need solumedrol this am.  We need to see a BMET.     COPD:   See above.  CKD:  BMET pending  DM:  SSI  Rollene Rotunda 07/07/2013 6:47 AM

## 2013-07-07 NOTE — Progress Notes (Signed)
Inpatient Diabetes Program Recommendations  AACE/ADA: New Consensus Statement on Inpatient Glycemic Control (2013)  Target Ranges:  Prepandial:   less than 140 mg/dL      Peak postprandial:   less than 180 mg/dL (1-2 hours)      Critically ill patients:  140 - 180 mg/dL   Results for Anita [name], Anita [name] (MRN [id number]) as of 07/07/2013 09:46  Ref. Range 05/22/2013 15:32 07/06/2013 12:42 07/07/2013 08:07  Hemoglobin A1C Latest Range: <5.7 % 6.9    Glucose Latest Range: 70-99 mg/dl  409 (H) 811 (H)    Inpatient Diabetes Program Recommendations Correction (SSI): Please order CBGs with Novolog correction Q4H while NPO (ACHS when diet ordered).  Note: Patient has a history of diabetes but does not take any medications for diabetes as an outpatient for diabetes management.  Currently, patient is not ordered to receive medications for treatment for inpatient glycemic control.  Initial glucose noted to be 219 mg/dl on labs yesterday and fasting glucose this morning was 208 mg/dl.  Noted patient is ordered to received Solumedrol 125 mg IV @ 8:00 (one time order) which is likely to further elevate blood glucose.  While inpatient, please order CBGs with Novolog correction Q4H while NPO (or ACHS when diet is ordered).  Will continue to follow.  Thanks,  Orlando Penner, RN, MSN, CCRN Diabetes Coordinator Inpatient Diabetes Program [phone] (Team Pager) [phone] ([facility] office) [phone] [facility] office)

## 2013-07-07 NOTE — Progress Notes (Addendum)
The patient does not want to have the cardiac cath procedure unless she talks with Dr. Antoine Poche.  The [facility] physician on-call was notified.  The consent was placed in the chart, but was not signed.

## 2013-07-07 NOTE — Progress Notes (Addendum)
Attending Addendum  I examined the patient and discussed the assessment and plan with Dr. Caleb Popp. I have reviewed the note and agree.  67 yo F presenting with NSTEMI went for cath this AM which revealed severe two vessel disease and LV dysfunction. She also  had an episode of chest pain post cath that resolved with nitroglycerin. Medical management recommended. Patient on BB, statin, ACEi, nitrates. Will start plavix 300 mg PO x one now with asa then 81 mg q AM.  Continuing IV lasix for additional diuresis, excellent diuresis last night.   Regarding groin wounds. Superficial excoriations in the setting of intertrigo. No cellulitis. Plan to use nystatin powder to keep area dry.     Dessa Phi, MD FAMILY MEDICINE TEACHING SERVICE

## 2013-07-07 NOTE — Interval H&P Note (Signed)
History and Physical Interval Note:  07/07/2013 10:37 AM  Anita [name]  has presented today for surgery, with the diagnosis of Chest pain  The various methods of treatment have been discussed with the patient and family. After consideration of risks, benefits and other options for treatment, the patient has consented to  Procedure(s): LEFT HEART CATHETERIZATION WITH CORONARY ANGIOGRAM (N/A) as a surgical intervention .  The patient's history has been reviewed, patient examined, no change in status, stable for surgery.  I have reviewed the patient's chart and labs.  Questions were answered to the patient's satisfaction.   Cath Lab Visit (complete for each Cath Lab visit)  Clinical Evaluation Leading to the Procedure:   ACS: yes  Non-ACS:    Anginal Classification: CCS IV  Anti-ischemic medical therapy: Minimal Therapy (1 class of medications)  Non-Invasive Test Results: No non-invasive testing performed  Prior CABG: No previous CABG        Theron Arista [facility] 07/07/2013 10:37 AM

## 2013-07-07 NOTE — H&P (View-Only) (Signed)
 SUBJECTIVE:  Breathing better.  No pain   PHYSICAL EXAM Filed Vitals:   07/06/13 1834 07/06/13 2140 07/07/13 0152 07/07/13 0551  BP: 128/67 124/65 131/58 111/61  Pulse: 108 104 107 97  Temp: 98 F (36.7 C) 97.8 F (36.6 C) 98.4 F (36.9 C) 98.1 F (36.7 C)  TempSrc: Oral Oral Oral Oral  Resp: 22 20 20 19  Height: 5' 1" (1.549 m)     Weight: 239 lb 6.4 oz (108.591 kg)   235 lb 3.7 oz (106.7 kg)  SpO2:  98% 98% 99%   General:  No distress Lungs:  Clear Heart:  RRR Abdomen:  Positive bowel sounds, no rebound no guarding Extremities:  Trace edema  LABS: Lab Results  Component Value Date   TROPONINI 3.37* 07/07/2013   Results for orders placed during the hospital encounter of 07/06/13 (from the past 24 hour(s))  URINALYSIS, ROUTINE W REFLEX MICROSCOPIC     Status: Abnormal   Collection Time    07/06/13 12:20 PM      Result Value Range   Color, Urine YELLOW  YELLOW   APPearance CLOUDY (*) CLEAR   Specific Gravity, Urine 1.006  1.005 - 1.030   pH 6.0  5.0 - 8.0   Glucose, UA NEGATIVE  NEGATIVE mg/dL   Hgb urine dipstick SMALL (*) NEGATIVE   Bilirubin Urine NEGATIVE  NEGATIVE   Ketones, ur NEGATIVE  NEGATIVE mg/dL   Protein, ur NEGATIVE  NEGATIVE mg/dL   Urobilinogen, UA 0.2  0.0 - 1.0 mg/dL   Nitrite POSITIVE (*) NEGATIVE   Leukocytes, UA LARGE (*) NEGATIVE  URINE MICROSCOPIC-ADD ON     Status: Abnormal   Collection Time    07/06/13 12:20 PM      Result Value Range   Squamous Epithelial / LPF RARE  RARE   WBC, UA TOO NUMEROUS TO COUNT  <3 WBC/hpf   RBC / HPF 0-2  <3 RBC/hpf   Bacteria, UA MANY (*) RARE  CBC     Status: Abnormal   Collection Time    07/06/13 12:42 PM      Result Value Range   WBC 9.6  4.0 - 10.5 K/uL   RBC 4.35  3.87 - 5.11 MIL/uL   Hemoglobin 10.8 (*) 12.0 - 15.0 g/dL   HCT 33.3 (*) 36.0 - 46.0 %   MCV 76.6 (*) 78.0 - 100.0 fL   MCH 24.8 (*) 26.0 - 34.0 pg   MCHC 32.4  30.0 - 36.0 g/dL   RDW 18.3 (*) 11.5 - 15.5 %   Platelets 259  150 -  400 K/uL  BASIC METABOLIC PANEL     Status: Abnormal   Collection Time    07/06/13 12:42 PM      Result Value Range   Sodium 141  135 - 145 mEq/L   Potassium 3.1 (*) 3.5 - 5.1 mEq/L   Chloride 102  96 - 112 mEq/L   CO2 24  19 - 32 mEq/L   Glucose, Bld 219 (*) 70 - 99 mg/dL   BUN 17  6 - 23 mg/dL   Creatinine, Ser 0.90  0.50 - 1.10 mg/dL   Calcium 9.8  8.4 - 10.5 mg/dL   GFR calc non Af Amer 65 (*) >90 mL/min   GFR calc Af Amer 76 (*) >90 mL/min  PRO B NATRIURETIC PEPTIDE     Status: Abnormal   Collection Time    07/06/13 12:43 PM      Result Value Range     Pro B Natriuretic peptide (BNP) 1148.0 (*) 0 - 125 pg/mL  TROPONIN I     Status: Abnormal   Collection Time    07/06/13 12:43 PM      Result Value Range   Troponin I 0.51 (*) <0.30 ng/mL  HEPARIN LEVEL (UNFRACTIONATED)     Status: Abnormal   Collection Time    07/06/13  9:15 PM      Result Value Range   Heparin Unfractionated 0.18 (*) 0.30 - 0.70 IU/mL  PROTIME-INR     Status: None   Collection Time    07/06/13  9:15 PM      Result Value Range   Prothrombin Time 12.7  11.6 - 15.2 seconds   INR 0.97  0.00 - 1.49  TROPONIN I     Status: Abnormal   Collection Time    07/06/13  9:15 PM      Result Value Range   Troponin I 3.32 (*) <0.30 ng/mL  TROPONIN I     Status: Abnormal   Collection Time    07/07/13  2:02 AM      Result Value Range   Troponin I 3.37 (*) <0.30 ng/mL  MRSA PCR SCREENING     Status: None   Collection Time    07/07/13  4:36 AM      Result Value Range   MRSA by PCR NEGATIVE  NEGATIVE    Intake/Output Summary (Last 24 hours) at 07/07/13 0647 Last data filed at 07/07/13 0552  Gross per 24 hour  Intake      3 ml  Output   3800 ml  Net  -3797 ml    EKG:  NSR, rate 94, no acute ST T wave changes. 07/07/2013  ASSESSMENT AND PLAN:  ACUTE RESPIRATORY FAILURE:  Good urine output.  Breathing better.  Continue pulmonary meds.  Hold diuretic until after the procedure  NQWMI:  I agree with cath to  see if there is a treatable culprit lesion.  She will need solumedrol this am.  We need to see a BMET.     COPD:   See above.  CKD:  BMET pending  DM:  SSI  Anita [name] 07/07/2013 6:47 AM   

## 2013-07-07 NOTE — Progress Notes (Signed)
Called by RN re: chest pain Pt had chest pain earlier, she thought indigestion. Belching, too.  Symptoms improved now. RN was letting us know.  Added Mylanta to meds. Pt has received Imdur earlier.  Continue to follow. Check ECG if more symptoms.

## 2013-07-07 NOTE — Telephone Encounter (Signed)
Pt is in the hospital [facility] and wants dr Adriana Simas to come see her

## 2013-07-07 NOTE — Progress Notes (Signed)
Family Medicine Teaching Service Daily Progress Note Intern Pager: [phone]  Patient name: Anita [name] Medical record number: [id number] Date of birth: 1946/06/26 Age: 67 y.o. Gender: female  Primary Care Provider: Everlene Other, DO Consultants: Cardiology Code Status: Full Code  Pt Overview and Major Events to Date:  11/11 - troponin elevated up to 3.37; cardiac cath today  Assessment and Plan: Anita [name] is a 67 y.o. female presenting with shortness of breath . PMH is significant for COPD, CHF, metastatic breast cancer. Currently not having any chest pain, requiring 4LNC oxygen, on heparin gtt, and stable, waiting for cath today.   #NSTEMI: EKG changes and troponin of 0.51 in the ED. Has a recent history of chest pain and shortness of breath. Prior history of NSTEMI (x1 in 08/2012, x1 in 02/2013) without cath evaluation as patient elected for medical management at the time. Started on heparin drip. Currently not having chest pain. Troponin was high: 3.32 -> 3.37 -> 3.07. Continue heparin gtt  Continue aspirin  Continue atorvastatin  Follow-up AM EKG  Continue home imdur Follow-up cardiology recommendations s/p cath  #Shortness of breath: with increased O2 requirement from her baseline home O2 (night and as needed during the day). most likely CHF exacerbation. Could also be COPD exacerbation vs pulmonary embolism since she has increased O2 requirement, hx of breast cancer and is tachycardic. D-dimer low at <0.27. Currently on 4L O2. Continuous pulse ox O2 therapy to keep sats >90  #Acute on chronic dCHF exacerbation: likely triggered by ACS event.  Echo: 02/2013: EF: 55-60% and grade 1 diastolic dysfunction. currently on Torsemide 50mg  BID at home. BNP today of 1148 which is down from her previous values of 2987 and 14782 on previous admissions. Baseline weight is 235-238 per PCP note. Weight on admission is 230lbs. Current weight is 235lbs. UOP: 3.8L in last 18 hours. sBPs   100-120s. Strict ins/outs and follow-up daily  Daily weights and follow-up  Will hold lasix until after cath TED hose  Follow up BP in context of diuresis  # COPD: no evidence of productive cough. Unclear that this is exacerbation.  - hold on steroids. No need for antibiotic coverage at this time.  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 Patient managing with oncologist.   FEN/GI: Heart health, NPO after midnight  Prophylaxis: heparin gtt  Disposition: cath today  Subjective: Not having shortness of breath or chest pain. Has not been up to walk around. No events overnight.   Objective: Temp:  [97.8 F (36.6 C)-98.4 F (36.9 C)] 98.1 F (36.7 C) (11/11 0551) Pulse Rate:  [97-116] 97 (11/11 0551) Resp:  [19-26] 19 (11/11 0551) BP: (99-131)/(49-67) 111/61 mmHg (11/11 0551) SpO2:  [97 %-100 %] 99 % (11/11 0551) Weight:  [230 lb (104.327 kg)-239 lb 6.4 oz (108.591 kg)] 235 lb 3.7 oz (106.7 kg) (11/11 0551)  Physical Exam: General: Laying in bed in no acute distress,  Cardiovascular: RRR, no murmurs, distant heart sounds Respiratory: crackles heard in lower lung bases, otherwise moves good air, no wheezing Abdomen: soft,  obese, non-tender Extremities: LE edema bilaterally, TED hose on, tender bilaterally  Laboratory:  Recent Labs Lab 07/06/13 1242  WBC 9.6  HGB 10.8*  HCT 33.3*  PLT 259    Recent Labs Lab 07/06/13 1242  NA 141  K 3.1*  CL 102  CO2 24  BUN 17  CREATININE 0.90  CALCIUM 9.8  GLUCOSE 219*     Cardiac Panel (last 3 results)  Recent Labs  07/06/13 2115 07/07/13 0202 07/07/13 0809  TROPONINI 3.32* 3.37* 3.07*   EKG: Sinus rhythm with 1st degree A-V block; Non-specific intra-ventricular conduction block; Abnormal ECG; no significant change since tracing yesterday (official read)  Imaging/Diagnostic Tests:  No recent imaging  Jacquelin Hawking, MD 07/07/2013, 7:35 AM PGY-1, [facility] Family Medicine FPTS Intern pager: [phone], text pages welcome

## 2013-07-07 NOTE — Telephone Encounter (Signed)
Dr. Craige Cotta is listed as being on Vacation today and tomorrow. Pt is aware I will send msg to Dr. Craige Cotta so he is aware she is in [facility].  However, advised she will need to ask her nurse to ask for a pulmonary consult so one of VS's partners can come see her.  She verbalized understanding and is in agreement with this plan.  Will sign off and route to VS as FYI.

## 2013-07-08 ENCOUNTER — Ambulatory Visit (HOSPITAL_COMMUNITY): Payer: Medicare HMO

## 2013-07-08 DIAGNOSIS — I214 Non-ST elevation (NSTEMI) myocardial infarction: Secondary | ICD-10-CM

## 2013-07-08 DIAGNOSIS — E876 Hypokalemia: Secondary | ICD-10-CM

## 2013-07-08 DIAGNOSIS — N189 Chronic kidney disease, unspecified: Secondary | ICD-10-CM

## 2013-07-08 DIAGNOSIS — J383 Other diseases of vocal cords: Secondary | ICD-10-CM

## 2013-07-08 LAB — GLUCOSE, CAPILLARY

## 2013-07-08 LAB — CBC
Hemoglobin: 10.8 g/dL — ABNORMAL LOW (ref 12.0–15.0)
RBC: 4.43 MIL/uL (ref 3.87–5.11)

## 2013-07-08 LAB — URINE CULTURE

## 2013-07-08 LAB — BASIC METABOLIC PANEL
CO2: 26 mEq/L (ref 19–32)
Creatinine, Ser: 1.12 mg/dL — ABNORMAL HIGH (ref 0.50–1.10)
GFR calc Af Amer: 58 mL/min — ABNORMAL LOW (ref >90)
Glucose, Bld: 187 mg/dL — ABNORMAL HIGH (ref 70–99)
Potassium: 3.4 mEq/L — ABNORMAL LOW (ref 3.5–5.1)
Sodium: 139 mEq/L (ref 135–145)

## 2013-07-08 MED ORDER — POTASSIUM CHLORIDE CRYS ER 20 MEQ PO TBCR
40.0000 meq | EXTENDED_RELEASE_TABLET | Freq: Once | ORAL | Status: AC
  Administered 2013-07-08: 40 meq via ORAL

## 2013-07-08 MED ORDER — POTASSIUM CHLORIDE CRYS ER 20 MEQ PO TBCR
40.0000 meq | EXTENDED_RELEASE_TABLET | Freq: Every day | ORAL | Status: DC
  Administered 2013-07-09: 09:00:00 40 meq via ORAL
  Filled 2013-07-08 (×2): qty 2

## 2013-07-08 MED ORDER — POTASSIUM CHLORIDE CRYS ER 20 MEQ PO TBCR
20.0000 meq | EXTENDED_RELEASE_TABLET | Freq: Every day | ORAL | Status: DC
  Administered 2013-07-08: 11:00:00 20 meq via ORAL

## 2013-07-08 NOTE — Progress Notes (Signed)
The patient's right radial cath site remained at a Level 0 throughout the night without complications.  She did not have any complaints of pain throughout the night and did not have any acute changes.  She diuresed well.

## 2013-07-08 NOTE — Progress Notes (Signed)
Family Medicine Teaching Service Daily Progress Note Intern Pager: [phone]  Patient name: Anita [name] Medical record number: [id number] Date of birth: Feb 26, 1946 Age: 67 y.o. Gender: female  Primary Care Provider: Everlene Other, DO Consultants: Cardiology Code Status: Full Code  Pt Overview and Major Events to Date:  11/11 - troponin elevated up to 3.37; cardiac cath today  Assessment and Plan: Anita [name] is a 67 y.o. female presenting with shortness of breath . PMH is significant for COPD, CHF, metastatic breast cancer. Currently not having any chest pain, requiring 4LNC oxygen, on heparin gtt, and stable, waiting for cath today.   #NSTEMI: EKG changes and troponin of 0.51 in the ED. Has a recent history of chest pain and shortness of breath. Prior history of NSTEMI (x1 in 08/2012, x1 in 02/2013) without cath evaluation as patient elected for medical management at the time. Started on heparin drip. Currently not having chest pain. Troponin was high: 3.32 -> 3.37 -> 3.07. 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 LVEF is 30-35% and LV has severe dysfunction with high fillin pressures Continue aspirin Continue plavix Continue atorvastatin  Cardiology recommendations s/p cath  Restart diuresis: lasix 40mg  BID  Increase nitrates: Imdur 60mg  QD (from 30mg )  Start low dose bisoprolol: bisoprolol 5mg  QD  Will consider adding an ACEi  Will consider stenting of the LAD if patient is symptomatic  #Acute systolic CHF on chronic dCHF exacerbation: likely triggered by ACS event.  Echo: 02/2013: EF: 55-60% and grade 1 diastolic dysfunction. currently on Torsemide 50mg  BID at home. BNP today of 1148 which is down from her previous values of 2987 and 21308 on previous admissions. Baseline weight is 235-238 per PCP note. Weight on admission is 230lbs in the ED and 239lbs on the floor. Current weight is 238lbs. UOP: 2.05L in last 24 hours (  in the last 12). sBPs  100-130s. Strict ins/outs and follow-up daily Diuresis as in above problem Daily weights and follow-up  Follow up BP in context of diuresis  # COPD: no evidence of productive cough. Unclear that this is exacerbation.  - hold on steroids. No need for antibiotic coverage at this time. Currently on 4L O2. 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 Patient managing with oncologist and ENT outpatient  FEN/GI: Heart health  Prophylaxis: subq heparin  Disposition: cath today  Subjective: She had one episode of dull right sided chest pain similar to previous events that lasted for 20 minutes and relieved with nitroglycerin. She also had a lot of burping. Cardiology consulted and prescribed Mylanta with ECG to follow if symptoms continued. She has no shortness of breath.  Objective: Temp:  [98.4 F (36.9 C)-98.9 F (37.2 C)] 98.9 F (37.2 C) (11/11 2120) Pulse Rate:  [76-92] 76 (11/11 2120) Resp:  [19-20] 19 (11/11 2120) BP: (106-137)/(54-86) 110/56 mmHg (11/11 2120) SpO2:  [96 %-100 %] 97 % (11/11 2120)  Physical Exam: General: Laying sitting up in a chair, in no acute distress,  Cardiovascular: RRR, no murmurs, distant heart  sounds Respiratory: breath sounds mildly audible but low. Attributing to body habitus. Could not hear wheezes or crackles Abdomen: soft, obese, non-tender Extremities: LE edema bilaterally, mildly pitting, no tenderness  Laboratory:  Recent Labs Lab 07/06/13 1242 07/07/13 0807  WBC 9.6 13.6*  HGB 10.8* 11.3*   HCT 33.3* 34.4*  PLT 259 301    Recent Labs Lab 07/06/13 1242 07/07/13 0807  NA 141 140  K 3.1* 3.4*  CL 102 99  CO2 24 26  BUN 17 29*  CREATININE 0.90 1.02  CALCIUM 9.8 10.0  GLUCOSE 219* 208*    Cardiac Panel (last 3 results)  Recent Labs  07/06/13 2115 07/07/13 0202 07/07/13 0809  TROPONINI 3.32* 3.37* 3.07*   EKG: Sinus rhythm with 1st degree A-V block; Non-specific intra-ventricular conduction block; Abnormal ECG; no significant change since tracing yesterday (official read)  Imaging/Diagnostic Tests:  No recent imaging  Jacquelin Hawking, MD 07/08/2013, 6:31 AM PGY-1, [facility] Family Medicine FPTS Intern pager: [phone], text pages welcome

## 2013-07-08 NOTE — Progress Notes (Signed)
Attending Addendum  I examined the patient and discussed the assessment and plan with Dr. Caleb Popp. I have reviewed the note and agree.  Patient doing well today. No recurrent CP. Diuresing well. Would like to walk. Plan to continue IV diuresis. Possible d/c tomorrow. Replete potassium today, at 3.4 now.     Dessa Phi, MD FAMILY MEDICINE TEACHING SERVICE

## 2013-07-08 NOTE — Progress Notes (Signed)
SUBJECTIVE:  Breathing better.  No pain   PHYSICAL EXAM Filed Vitals:   07/07/13 1322 07/07/13 1417 07/07/13 2120 07/08/13 0642  BP: 137/74 137/86 110/56 124/62  Pulse: 92  76 59  Temp: 98.4 F (36.9 C)  98.9 F (37.2 C) 98 F (36.7 C)  TempSrc: Oral  Oral Oral  Resp:   19 17  Height:      Weight:    238 lb 1.6 oz (108 kg)  SpO2: 96%  97% 100%   General:  No distress Lungs:  Clear Heart:  RRR Abdomen:  Positive bowel sounds, no rebound no guarding Extremities:  Trace edema  LABS:  Results for orders placed during the hospital encounter of 07/06/13 (from the past 24 hour(s))  HEPARIN LEVEL (UNFRACTIONATED)     Status: Abnormal   Collection Time    07/07/13  8:07 AM      Result Value Range   Heparin Unfractionated 0.24 (*) 0.30 - 0.70 IU/mL  CBC     Status: Abnormal   Collection Time    07/07/13  8:07 AM      Result Value Range   WBC 13.6 (*) 4.0 - 10.5 K/uL   RBC 4.54  3.87 - 5.11 MIL/uL   Hemoglobin 11.3 (*) 12.0 - 15.0 g/dL   HCT 81.1 (*) 91.4 - 78.2 %   MCV 75.8 (*) 78.0 - 100.0 fL   MCH 24.9 (*) 26.0 - 34.0 pg   MCHC 32.8  30.0 - 36.0 g/dL   RDW 95.6 (*) 21.3 - 08.6 %   Platelets 301  150 - 400 K/uL  BASIC METABOLIC PANEL     Status: Abnormal   Collection Time    07/07/13  8:07 AM      Result Value Range   Sodium 140  135 - 145 mEq/L   Potassium 3.4 (*) 3.5 - 5.1 mEq/L   Chloride 99  96 - 112 mEq/L   CO2 26  19 - 32 mEq/L   Glucose, Bld 208 (*) 70 - 99 mg/dL   BUN 29 (*) 6 - 23 mg/dL   Creatinine, Ser 5.78  0.50 - 1.10 mg/dL   Calcium 46.9  8.4 - 62.9 mg/dL   GFR calc non Af Amer 56 (*) >90 mL/min   GFR calc Af Amer 65 (*) >90 mL/min  D-DIMER, QUANTITATIVE     Status: None   Collection Time    07/07/13  8:07 AM      Result Value Range   D-Dimer, Quant <0.27  0.00 - 0.48 ug/mL-FEU  TROPONIN I     Status: Abnormal   Collection Time    07/07/13  8:09 AM      Result Value Range   Troponin I 3.07 (*) <0.30 ng/mL  GLUCOSE, CAPILLARY     Status:  Abnormal   Collection Time    07/07/13 11:33 AM      Result Value Range   Glucose-Capillary 148 (*) 70 - 99 mg/dL  CBC     Status: Abnormal   Collection Time    07/08/13  5:45 AM      Result Value Range   WBC 16.2 (*) 4.0 - 10.5 K/uL   RBC 4.43  3.87 - 5.11 MIL/uL   Hemoglobin 10.8 (*) 12.0 - 15.0 g/dL   HCT 52.8 (*) 41.3 - 24.4 %   MCV 76.1 (*) 78.0 - 100.0 fL   MCH 24.4 (*) 26.0 - 34.0 pg   MCHC 32.0  30.0 - 36.0  g/dL   RDW 16.1 (*) 09.6 - 04.5 %   Platelets 296  150 - 400 K/uL    Intake/Output Summary (Last 24 hours) at 07/08/13 0659 Last data filed at 07/08/13 0646  Gross per 24 hour  Intake    483 ml  Output   3450 ml  Net  -2967 ml    EKG:  NSR, rate 94, no acute ST T wave changes. 07/08/2013  ASSESSMENT AND PLAN:  ACUTE RESPIRATORY FAILURE:  Good urine output.  Breathing better.  Continue pulmonary meds.  She is still possibly 10 lbs above her "dry" weight.  I will continue IV diuresis today.   NQWMI:  Occluded RCA.  Moderate LAD stenosis.  Still medical management planned.  increased LVEDP secondary to increased salt use could certainly contribute to this NQWMI.  COPD:   See above.  CKD:  Stable before the cath.  Pending for today.   DM:  SSI  Rollene Rotunda 07/08/2013 6:59 AM

## 2013-07-08 NOTE — Progress Notes (Signed)
Pt said IV was itching scratched tape off and IV bent. D/C'd x 1 unsuccessful attempt call made to IV team for restart.

## 2013-07-08 NOTE — Progress Notes (Signed)
Pt ambulated in hallway with 02 @ 2 L n/c 200 ft tolerated fair.

## 2013-07-09 ENCOUNTER — Other Ambulatory Visit: Payer: Self-pay | Admitting: Physician Assistant

## 2013-07-09 ENCOUNTER — Ambulatory Visit: Payer: Medicare HMO | Admitting: Cardiology

## 2013-07-09 DIAGNOSIS — I5030 Unspecified diastolic (congestive) heart failure: Secondary | ICD-10-CM

## 2013-07-09 LAB — BASIC METABOLIC PANEL
CO2: 27 mEq/L (ref 19–32)
Calcium: 9.4 mg/dL (ref 8.4–10.5)
Creatinine, Ser: 1.19 mg/dL — ABNORMAL HIGH (ref 0.50–1.10)
GFR calc non Af Amer: 47 mL/min — ABNORMAL LOW (ref >90)
Glucose, Bld: 143 mg/dL — ABNORMAL HIGH (ref 70–99)

## 2013-07-09 LAB — CBC
HCT: 31.4 % — ABNORMAL LOW (ref 36.0–46.0)
Hemoglobin: 10.3 g/dL — ABNORMAL LOW (ref 12.0–15.0)
MCH: 24.9 pg — ABNORMAL LOW (ref 26.0–34.0)
MCHC: 32.8 g/dL (ref 30.0–36.0)
MCV: 75.8 fL — ABNORMAL LOW (ref 78.0–100.0)
RBC: 4.14 MIL/uL (ref 3.87–5.11)
RDW: 19.3 % — ABNORMAL HIGH (ref 11.5–15.5)

## 2013-07-09 MED ORDER — NYSTATIN 100000 UNIT/GM EX POWD: CUTANEOUS | Status: DC

## 2013-07-09 MED ORDER — TORSEMIDE 20 MG PO TABS: 20.0000 mg | ORAL_TABLET | Freq: Two times a day (BID) | ORAL | Status: DC

## 2013-07-09 NOTE — Progress Notes (Signed)
    SUBJECTIVE:  Breathing better.  No pain.  She wants to go home.    PHYSICAL EXAM Filed Vitals:   07/08/13 1340 07/08/13 2100 07/09/13 0207 07/09/13 0500  BP: 113/51 105/51 122/57 105/56  Pulse: 65 73 70 65  Temp: 97.9 F (36.6 C) 97.3 F (36.3 C) 98.5 F (36.9 C) 98.4 F (36.9 C)  TempSrc: Oral Oral Oral Oral  Resp:  20 18 18   Height:      Weight:      SpO2: 99% 98% 100% 100%   General:  No distress Lungs:  Clear Heart:  RRR Abdomen:  Positive bowel sounds, no rebound no guarding Extremities:  Mild edema  LABS:  Results for orders placed during the hospital encounter of 07/06/13 (from the past 24 hour(s))  GLUCOSE, CAPILLARY     Status: Abnormal   Collection Time    07/08/13 11:40 PM      Result Value Range   Glucose-Capillary 161 (*) 70 - 99 mg/dL   Comment 1 Notify RN     Comment 2 Documented in Chart    CBC     Status: Abnormal   Collection Time    07/09/13  5:10 AM      Result Value Range   WBC 10.6 (*) 4.0 - 10.5 K/uL   RBC 4.14  3.87 - 5.11 MIL/uL   Hemoglobin 10.3 (*) 12.0 - 15.0 g/dL   HCT 16.1 (*) 09.6 - 04.5 %   MCV 75.8 (*) 78.0 - 100.0 fL   MCH 24.9 (*) 26.0 - 34.0 pg   MCHC 32.8  30.0 - 36.0 g/dL   RDW 40.9 (*) 81.1 - 91.4 %   Platelets 275  150 - 400 K/uL    Intake/Output Summary (Last 24 hours) at 07/09/13 0648 Last data filed at 07/09/13 0500  Gross per 24 hour  Intake   1220 ml  Output   2000 ml  Net   -780 ml    EKG:  NSR, rate 94, no acute ST T wave changes. 07/09/2013  ASSESSMENT AND PLAN:  ACUTE RESPIRATORY FAILURE:  Good urine output.  Breathing better.  Continue pulmonary meds.  She wants to go home today.  I discontinued the Foley.  OK to send home on previous medications changing Torsemide to 20 mg po BID.  Needs a BMET on Monday and a TOC appt.   NQWMI:  Occluded RCA.  Moderate LAD stenosis.  Still medical management planned.  increased LVEDP secondary to increased salt use could certainly contribute to this NQWMI.  We  discussed again today.   COPD:   See above.  CKD:  Stable before the cath.  Creat stable.   DM:  SSI  Rollene Rotunda 07/09/2013 6:48 AM

## 2013-07-09 NOTE — Plan of Care (Signed)
Problem: Phase III Progression Outcomes Goal: Vascular site scale level 0 - I Vascular Site Scale Level 0: No bruising/bleeding/hematoma Level I (Mild): Bruising/Ecchymosis, minimal bleeding/ooozing, palpable hematoma < 3 cm Level II (Moderate): Bleeding not affecting hemodynamic parameters, pseudoaneurysm, palpable hematoma > 3 cm Level III (Severe) Bleeding which affects hemodynamic parameters or retroperitoneal hemorrhage  Outcome: Completed/Met Date Met:  07/09/13 Level "0"

## 2013-07-09 NOTE — Discharge Summary (Signed)
Family Medicine Teaching [facility] Discharge Summary  Patient name: Anita [name] Medical record number: [id number] Date of birth: Oct 27, 1945 Age: 67 y.o. Gender: female Date of Admission: 07/06/2013  Date of Discharge: 07/09/2013 Admitting Physician: Lora Paula, MD  Primary Care Provider: Everlene Other, DO Consultants: Cardiology  Indication for Hospitalization: NSTEMI  Discharge Diagnoses/Problem List:  1. Non-Q Wave Myocardial Infarction (NQWMI) 2. Acute systolic CHF on chronic diastolic CHF 3. COPD 4. Breast Cancer 5. Intertrigo 6. Hypokalemia 7. Hoarseness  Disposition: Discharge home  Discharge Condition: Stable  Discharge Exam:  General: Laying sitting up in a chair, in no acute distress,  Cardiovascular: RRR, no murmurs, distant heart sounds  Respiratory: Non-labored, CTAB, distant sounds  Abdomen: soft, obese, non-tender  Extremities: LE edema bilaterally, mildly pitting, no tenderness  Brief Hospital Course: Anita [name] is a 67 y.o. female presenting with shortness of breath for 2 days but woke up today gasping for air. She has had one week of increased swelling and took 60 torsemide instead of 50mg  once last week because of her swelling. She has been compliant with her medication, however, she has not been taking her potassium pill. She has also had to increase her O2 requirement recently from 2L to 4L especially since she started school in August due to her having to walk around more often. She does endorse dull left sided, non-radiating chest pain occuring this morning, pain rated 7/10 and lasting 30 minutes, associated with shortness of breath, occurred as she woke up. She reports 2-3 pillow orthopnea and PND. She did note some increased swelling in her lower extremities bilaterally. She has no sick contacts. She has a non-productive cough, no lightheadedness, no headache, no changes in vision, no nausea, no vomiting, no abdominal or suprapubic pain, no  increased frequency, no hesitancy and no dysuria.   1. Non-Q Wave Myocardial Infarction (NQWMI): History revealed patient had episode of chest pain earlier in the day. EKG revealed sinus tachycardia with no ischemic changes. Serial troponin was elevated to 3.37. She was placed on heparin drip and home aspirin, statin and Imdur continued. Cardiology was consulted and performed a cardiac catheterization which revealed 2 vessel disease. Cardiology adjusted medications by increasing dose of Imdur and adding low dose bisoprolol while inpatient and reverting to home meds on discharge. They arranged follow-up with patient outpatient. Chest pain subsided before discharge. 2. Acute systolic CHF on chronic diastolic CHF: Most likely triggered by ACS event. ProBNP elevated but lower than previously. She was about 10lbs above dry weight and was diuresed on IV lasix with good urine output. Admission weight was 239 which was stable on discharge. Diuretic changed to toresemide 20mg  on discharge. 3. COPD: patient had increased shortness of breath with cough and no sputum production. PE ruled out with low d-dimer. She received solu-medrol x1 in the ED. Home Spiriva, and home O2 continued. She also received albuterol nebs q4 hours.  4. Breast Cancer: continued home letrozole 5. Intertrigo: patient had some superficial excoriations. No cellulitis noted. Patient given nystatin powder to keep area dry 6. Hypokalemia: Patient hypokalemic to 3.1. She was given potassium x4 which brought potassium up to 3.7. Patient discharged home on home potassium. 7. Hoarseness: history of vocal cord nodule. Patient following up outpatient with oncology and ENT. No management inpatient.  Issues for Follow Up:  - Recommend a BMP and CBC to monitor renal function and anemia. - O2 requirement - fluid status  Significant Procedures:  1. Cardiac catheterization  Significant  Labs and Imaging:   Recent Labs Lab 07/07/13 0807  07/08/13 0545 07/09/13 0510  WBC 13.6* 16.2* 10.6*  HGB 11.3* 10.8* 10.3*  HCT 34.4* 33.7* 31.4*  PLT 301 296 275    Recent Labs Lab 07/06/13 1242 07/07/13 0807 07/08/13 0545 07/09/13 0510  NA 141 140 139 138  K 3.1* 3.4* 3.4* 3.7  CL 102 99 100 100  CO2 24 26 26 27   GLUCOSE 219* 208* 187* 143*  BUN 17 29* 35* 37*  CREATININE 0.90 1.02 1.12* 1.19*  CALCIUM 9.8 10.0 10.1 9.4   Cardiac Panel (last 3 results)   Recent Labs   07/06/13 2115  07/07/13 0202  07/07/13 0809   TROPONINI  3.32*  3.37*  3.07*    D-Dimer, Quant  Date/Time Value Range Status  07/07/2013  8:07 AM <0.27  0.00 - 0.48 ug/mL-FEU Final    Results/Tests Pending at Time of Discharge: None  Discharge Medications:    Medication List         albuterol 108 (90 BASE) MCG/ACT inhaler  Commonly known as:  PROVENTIL HFA;VENTOLIN HFA  Inhale 1 puff into the lungs 2 (two) times daily as needed for wheezing or shortness of breath.     aspirin 81 MG EC tablet  Take 1 tablet (81 mg total) by mouth daily.     atorvastatin 40 MG tablet  Commonly known as:  LIPITOR  Take 1 tablet (40 mg total) by mouth daily at 6 PM.     CALCITRATE PO  Take 1 tablet by mouth daily.     docusate sodium 100 MG capsule  Commonly known as:  COLACE  Take 1 capsule (100 mg total) by mouth 2 (two) times daily as needed for constipation.     isosorbide mononitrate 30 MG 24 hr tablet  Commonly known as:  IMDUR  Take 30 mg by mouth daily.     KLOR-CON PO  Take 20 mEq by mouth daily.     letrozole 2.5 MG tablet  Commonly known as:  FEMARA  Take 1 tablet (2.5 mg total) by mouth daily.     nitroGLYCERIN 0.4 MG SL tablet  Commonly known as:  NITROSTAT  Place 1 tablet (0.4 mg total) under the tongue every 5 (five) minutes as needed for chest pain.     nystatin 100000 UNIT/GM Powd  Apply to groin two times a day     tiotropium 18 MCG inhalation capsule  Commonly known as:  SPIRIVA HANDIHALER  Place 1 capsule (18 mcg  total) into inhaler and inhale daily.     torsemide 20 MG tablet  Commonly known as:  DEMADEX  Take 1 tablet (20 mg total) by mouth 2 (two) times daily.        Discharge Instructions: Please refer to Patient Instructions section of EMR for full details.  Patient was counseled important signs and symptoms that should prompt return to medical care, changes in medications, dietary instructions, activity restrictions, and follow up appointments.   Follow-Up Appointments: Follow-up Information   Follow up with [facility]. (Labwork only Monday 07/13/13 - can come anytime between 8am-4pm)    Specialty:  Cardiology   Contact information:   [address], [address] [place] [address] [phone]      Follow up with Norma Fredrickson, NP. ([facility] - 07/17/13 at 2pm)    Specialty:  Nurse Practitioner   Contact information:   [address] [place] [address] [phone]  Follow up with Everlene Other, DO On 07/15/2013. (2:30pm)    Specialty:  Family Medicine   Contact information:   [address] [place] [address] [phone]       Jacquelin Hawking, MD 07/10/2013, 10:30 PM PGY-1, [facility] Family Medicine

## 2013-07-09 NOTE — Progress Notes (Signed)
The following cardiology appts were placed in epic: - BMET on Monday 07/13/13 (can come anytime between 8-4) - Transition-of-Care appt with Norma Fredrickson NP 07/17/13 at 2pm Ronie Spies PA-C

## 2013-07-09 NOTE — Progress Notes (Signed)
Pt discharged per w/c accompanied by nurse tech and family. Has all personal belongings and discharge instructions.

## 2013-07-09 NOTE — Progress Notes (Signed)
Attending Addendum  I examined the patient and discussed the assessment and plan with Dr. Jarvis Newcomer. I have reviewed the note and agree.  Patient medically stable for d/c to home. Feeling good. Will need note for school, she is in college. Will need to have f/u CBC added to f/u BMP on 07/13/13. Going home with torsemide 20 mg BID.  Dessa Phi, MD  FAMILY MEDICINE TEACHING

## 2013-07-09 NOTE — Progress Notes (Signed)
Went over all discharge instructions with pt and all her follow up appts. Pt does not have any more meds due tonight. Aware what meds she is to start back with in am. Prescriptions were called into her pharmacy. Pt states her family cannot come to get her until almost 8 pm. Had dinner and dressed awaiting her family for discharge.

## 2013-07-09 NOTE — Progress Notes (Signed)
Family Medicine Teaching Service Daily Progress Note Intern Pager: [phone]  Patient name: MERLYN BOLLEN Medical record number: [id number] Date of birth: September 29, 1945 Age: 67 y.o. Gender: female  Primary Care Provider: Everlene Other, DO Consultants: Cardiology Code Status: Full Code  Pt Overview and Major Events to Date:  11/11 - troponin elevated up to 3.37; cardiac cath  11/13 - Discharge with follow up  Assessment and Plan: Anita [name] is a 67 y.o. female presenting with shortness of breath . PMH is significant for COPD, CHF, metastatic breast cancer. Currently not having any chest pain, requiring 4LNC oxygen, on heparin gtt, and stable, waiting for cath today.   #NSTEMI: EKG changes and troponin of 0.51 in the ED. Has a recent history of chest pain and shortness of breath. Prior history of NSTEMI (x1 in 08/2012, x1 in 02/2013) without cath evaluation as patient elected for medical management at the time. Started on heparin drip. Currently not having chest pain. Troponin was high: 3.32 -> 3.37 -> 3.07. 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 LVEF is 30-35% and LV has severe dysfunction with high filling pressures Continue aspirin Continue plavix Continue atorvastatin  Cardiology recommendations s/p cath  Increase nitrates: Imdur 60mg  QD (from 30mg )  Start low dose bisoprolol: bisoprolol 5mg  QD  Will consider adding an ACEi  Will consider stenting of the LAD if patient is symptomatic Clear for discharge with home meds, changing torsemide to 20mg  po BID  #Acute systolic CHF on chronic dCHF exacerbation: likely triggered by ACS event.  Echo: 02/2013: EF: 55-60% and grade 1 diastolic dysfunction. currently on Torsemide 50mg  BID at home. BNP today of 1148 which is down from her previous values of 2987 and 29528 on previous admissions. Baseline weight is 235-238 per PCP note. Weight on admission is 230lbs in the ED and 239lbs on the  floor. Current weight is 238lbs. - 1543mL/24hrs, . sBPs  100-130s. Strict ins/outs and follow-up daily Diuresis as in above problem Daily weights and follow-up  Follow up BP in context of diuresis  # COPD: no evidence of productive cough.  - continue home spiriva - Continuous pulse ox - O2 therapy to keep sats >90  #Breast cancer: (ER pos/PR pos/HER2 neg) with metastasis to bone and possible metastasis to lungs given presence of pulmonary nodules on recent CT chest in June.  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 Patient managing with oncologist and ENT outpatient  FEN/GI: Heart health  Prophylaxis: subq heparin  Disposition: cath today  Subjective: Pt walked up and down the hallway yesterday and today. Denies CP, SOB.   Objective: Temp:  [97.3 F (36.3 C)-98.5 F (36.9 C)] 98.4 F (36.9 C) (11/13 0500) Pulse Rate:  [65-73] 65 (11/13 0500) Resp:  [18-20] 18 (11/13 0500) BP: (105-122)/(51-57) 105/56 mmHg (11/13 0500) SpO2:  [94 %-100 %] 94 % (11/13 0804) Weight:  [239 lb 3.2 oz (108.5 kg)] 239 lb 3.2 oz (108.5 kg) (11/13 0500)  Physical Exam: [name]: Laying sitting up in a chair, in no acute distress,  Cardiovascular: RRR, no murmurs, distant heart sounds Respiratory: Non-labored, CTAB, distant sounds Abdomen: soft, obese, non-tender Extremities: LE edema bilaterally, mildly pitting, no tenderness  Laboratory:  Recent Labs Lab 07/07/13 0807 07/08/13 0545 07/09/13 0510  WBC 13.6* 16.2* 10.6*  HGB 11.3* 10.8* 10.3*  HCT 34.4* 33.7* 31.4*  PLT 301 296 275    Recent Labs Lab 07/07/13 0807 07/08/13 0545 07/09/13 0510  NA 140 139 138  K 3.4* 3.4* 3.7  CL 99 100 100  CO2 26 26 27   BUN 29* 35* 37*  CREATININE 1.02 1.12* 1.19*  CALCIUM 10.0 10.1 9.4  GLUCOSE 208* 187* 143*    Cardiac Panel  (last 3 results)  Recent Labs  07/06/13 2115 07/07/13 0202 07/07/13 0809  TROPONINI 3.32* 3.37* 3.07*   EKG: Sinus rhythm with 1st degree A-V block; Non-specific intra-ventricular conduction block; Abnormal ECG; no significant change since tracing yesterday (official read)  Imaging/Diagnostic Tests:  No recent imaging  Hazeline Junker, MD 07/09/2013, 9:02 AM PGY-1, [facility] Family Medicine FPTS Intern pager: [phone], text pages welcome

## 2013-07-09 NOTE — Progress Notes (Signed)
Attending Addendum  I examined the patient and discussed the assessment and plan with Dr. Jarvis Newcomer. I have reviewed the note and agree.  Patient medically stable for d/c to home. Feeling good. Will need note for school, she is in college. Will need to have f/u CBC added to f/u BMP on 07/13/13. Going home with torsemide 20 mg BID.     Dessa Phi, MD FAMILY MEDICINE TEACHING SERVICE

## 2013-07-09 NOTE — Progress Notes (Signed)
Pt ambulated with staff last night from her room to the end of A hall on 3L O2, then sat down for about 5 minutes and then walked back to her room, will continue to monitor, Thanks Lavonda Jumbo RN.

## 2013-07-10 ENCOUNTER — Ambulatory Visit (HOSPITAL_COMMUNITY): Payer: Medicare HMO

## 2013-07-11 NOTE — Discharge Summary (Signed)
Attending Addendum  I examined the patient and discussed the discharge plan with Dr. Nettey. I have reviewed the note and agree.    Arvel Oquinn, MD FAMILY MEDICINE TEACHING SERVICE   

## 2013-07-13 ENCOUNTER — Other Ambulatory Visit: Payer: Medicare HMO

## 2013-07-13 ENCOUNTER — Ambulatory Visit (HOSPITAL_COMMUNITY): Payer: Medicare HMO

## 2013-07-15 ENCOUNTER — Ambulatory Visit (HOSPITAL_COMMUNITY): Payer: Medicare HMO

## 2013-07-15 ENCOUNTER — Ambulatory Visit: Payer: Medicare HMO | Admitting: Family Medicine

## 2013-07-16 ENCOUNTER — Encounter: Payer: Self-pay | Admitting: Medical Oncology

## 2013-07-17 ENCOUNTER — Ambulatory Visit (HOSPITAL_COMMUNITY): Payer: Medicare HMO

## 2013-07-17 ENCOUNTER — Encounter: Payer: Self-pay | Admitting: Nurse Practitioner

## 2013-07-17 ENCOUNTER — Other Ambulatory Visit: Payer: Self-pay | Admitting: Medical Oncology

## 2013-07-17 ENCOUNTER — Ambulatory Visit (INDEPENDENT_AMBULATORY_CARE_PROVIDER_SITE_OTHER): Payer: Commercial Managed Care - HMO | Admitting: Nurse Practitioner

## 2013-07-17 ENCOUNTER — Other Ambulatory Visit: Payer: Medicare HMO

## 2013-07-17 ENCOUNTER — Telehealth: Payer: Self-pay | Admitting: Internal Medicine

## 2013-07-17 VITALS — BP 100/80 | HR 88 | Ht 62.0 in | Wt 237.1 lb

## 2013-07-17 DIAGNOSIS — I214 Non-ST elevation (NSTEMI) myocardial infarction: Secondary | ICD-10-CM

## 2013-07-17 LAB — CBC WITH DIFFERENTIAL/PLATELET
Basophils Absolute: 0 10*3/uL (ref 0.0–0.1)
Basophils Relative: 0.4 % (ref 0.0–3.0)
Eosinophils Absolute: 0.1 10*3/uL (ref 0.0–0.7)
Eosinophils Relative: 0.8 % (ref 0.0–5.0)
HCT: 33.2 % — ABNORMAL LOW (ref 36.0–46.0)
Hemoglobin: 11 g/dL — ABNORMAL LOW (ref 12.0–15.0)
Lymphocytes Relative: 11.8 % — ABNORMAL LOW (ref 12.0–46.0)
Lymphs Abs: 1.1 10*3/uL (ref 0.7–4.0)
MCHC: 33.2 g/dL (ref 30.0–36.0)
MCV: 74.7 fl — ABNORMAL LOW (ref 78.0–100.0)
Monocytes Absolute: 0.6 10*3/uL (ref 0.1–1.0)
Monocytes Relative: 6.6 % (ref 3.0–12.0)
Neutro Abs: 7.5 10*3/uL (ref 1.4–7.7)
Neutrophils Relative %: 80.4 % — ABNORMAL HIGH (ref 43.0–77.0)
Platelets: 319 10*3/uL (ref 150.0–400.0)
RBC: 4.45 Mil/uL (ref 3.87–5.11)
RDW: 20.2 % — ABNORMAL HIGH (ref 11.5–14.6)
WBC: 9.3 10*3/uL (ref 4.5–10.5)

## 2013-07-17 LAB — BASIC METABOLIC PANEL
BUN: 26 mg/dL — ABNORMAL HIGH (ref 6–23)
CO2: 29 mEq/L (ref 19–32)
Calcium: 9.9 mg/dL (ref 8.4–10.5)
Chloride: 97 mEq/L (ref 96–112)
Creatinine, Ser: 1.3 mg/dL — ABNORMAL HIGH (ref 0.4–1.2)
GFR: 44.72 mL/min — ABNORMAL LOW (ref >60.00)
Glucose, Bld: 169 mg/dL — ABNORMAL HIGH (ref 70–99)
Potassium: 3.2 mEq/L — ABNORMAL LOW (ref 3.5–5.1)
Sodium: 136 mEq/L (ref 135–145)

## 2013-07-17 LAB — TSH: TSH: 0.04 u[IU]/mL — ABNORMAL LOW (ref 0.35–5.50)

## 2013-07-17 NOTE — Telephone Encounter (Signed)
lmonvm advising the pt of her appts that were r/s from 08/26/2013 to 07/21/2013 per md ords

## 2013-07-17 NOTE — Progress Notes (Signed)
Anita [name] Date of Birth: 1946/04/28 Medical Record #[id number]  History of Present Illness: [name] is seen back today for a post hospital visit/TOC visit. Seen for Dr. Antoine Poche. 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 ER 100%/ PR 0%/Her2 neu negative, and HLD. Other issues as listed below.   Most recently admitted with shortness of breath - acute on chronic combined systolic and diastolic HF. Had positive cardiac enzymes - consistent with NSTEMI. She was cathed - this showed 2 vessel disease (occluded RCA and moderate LAD stenosis which is to be managed medically. Did have increased LVEDP secondry to increased salt. Her HF was felt to be triggered by her ACS. She was diuresed. Discharged on 07/09/2013.  Comes back today. Here alone. Says she is doing better. Breathing better. Still gets short of breath. No cough. No swelling. Her PND is unchanged. Says she is not using salt. No chest pain. Feels ok on her medicines.   It is not clear to me as to what the plan for her heart failure is - she is not on ACE - has some CKD - was tried on Zebeta but not discharged on it.    Current Outpatient Prescriptions  Medication Sig Dispense Refill  . albuterol (PROVENTIL HFA;VENTOLIN HFA) 108 (90 BASE) MCG/ACT inhaler Inhale 1 puff into the lungs 2 (two) times daily as needed for wheezing or shortness of breath.      [name] aspirin EC 81 MG EC tablet Take 1 tablet (81 mg total) by mouth daily.  30 tablet  3  . atorvastatin (LIPITOR) 40 MG tablet Take 1 tablet (40 mg total) by mouth daily at 6 PM.  30 tablet  5  . Calcium Citrate (CALCITRATE PO) Take 1 tablet by mouth daily.       [name] docusate sodium (COLACE) 100 MG capsule Take 1 capsule (100 mg total) by mouth 2 (two) times daily as needed for constipation.  60 capsule  3  . isosorbide mononitrate (IMDUR) 30 MG 24 hr tablet Take 30 mg by mouth daily.      [name] letrozole (FEMARA) 2.5 MG tablet Take 1  tablet (2.5 mg total) by mouth daily.  30 tablet  1  . nitroGLYCERIN (NITROSTAT) 0.4 MG SL tablet Place 1 tablet (0.4 mg total) under the tongue every 5 (five) minutes as needed for chest pain.  30 tablet  0  . nystatin (MYCOSTATIN/NYSTOP) 100000 UNIT/GM POWD Apply to groin two times a day  15 g  0  . Potassium Chloride (KLOR-CON PO) Take 20 mEq by mouth daily.       [name] tiotropium (SPIRIVA HANDIHALER) 18 MCG inhalation capsule Place 1 capsule (18 mcg total) into inhaler and inhale daily.  30 capsule  6  . torsemide (DEMADEX) 20 MG tablet Take 1 tablet (20 mg total) by mouth 2 (two) times daily.  60 tablet  0   No current facility-administered medications for this visit.    Allergies  Allergen Reactions  . Omnipaque [Iohexol]     Pt claims she developed hives after given contrast  . Benzene Rash    Past Medical History  Diagnosis Date  . Fibroid   . Morbid obesity   . 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 COPD (chronic obstructive pulmonary disease)   . Chronic diastolic CHF (congestive heart failure)   . NSTEMI (non-ST elevated myocardial infarction)  a. NSTEMI 08/2012: med rx; nuc 10/2012: low risk (old scar, no significant ischemia, EF 49% with inf wall HK). b. NSTEMI 02/2013: managed med, pt declined cath at that time.  . Anemia     a. Adm 09/2012 with melena, Hgb 5.8 -> transfused. EGD/colonoscopy unrevealing.  . Breast cancer     a. Mets to bone. ER 100%/ PR 0%/Her2 neu negative.  [name] Ulcers of both lower legs   . Tobacco abuse   . Hyperlipidemia 02/11/2013  . Diabetes mellitus without complication   . Chronic respiratory failure     a. On O2 qhs. also portable O2.  . Chronic renal insufficiency   . Lesion of vocal cord     a. CT 05/2013 concerning for tumor.    Past Surgical History  Procedure Laterality Date  . Colonoscopy, esophagogastroduodenoscopy (egd) and esophageal dilation N/A 10/13/2012    Procedure: colonoscopy and egd;   Surgeon: Graylin Shiver, MD;  Location: [facility] ENDOSCOPY;  Service: Endoscopy;  Laterality: N/A;    History  Smoking status  . Former Smoker -- 2.00 packs/day for 40 years  . Types: Cigarettes  . Quit date: 09/24/2012  Smokeless tobacco  . Never Used    History  Alcohol Use No    Family History  Problem Relation Age of Onset  . Cancer Mother     leukemia  . Hypertension Mother   . Diabetes Father   . Heart disease Father     passed away due to heart attack    Review of Systems: The review of systems is per the HPI.  All other systems were reviewed and are negative.  Physical Exam: BP 100/80  Pulse 88  Ht 5\' 2"  (1.575 m)  Wt 237 lb 1.9 oz (107.557 kg)  BMI 43.36 kg/m2 Patient is very pleasant and in no acute distress. Seems a little mentally challenged. Has her oxygen with her. Skin is warm and dry. Color is normal.  HEENT is unremarkable. Normocephalic/atraumatic. PERRL. Sclera are nonicteric. Neck is supple. No masses. No JVD. Lungs are clear. Cardiac exam shows a regular rate and rhythm. +S3. Abdomen is soft. Extremities are without edema. Gait and ROM are intact. No gross neurologic deficits noted.  Wt Readings from Last 3 Encounters:  07/17/13 237 lb 1.9 oz (107.557 kg)  07/09/13 239 lb 3.2 oz (108.5 kg)  07/09/13 239 lb 3.2 oz (108.5 kg)    LABORATORY DATA: Lab Results  Component Value Date   WBC 10.6* 07/09/2013   HGB 10.3* 07/09/2013   HCT 31.4* 07/09/2013   PLT 275 07/09/2013   GLUCOSE 143* 07/09/2013   CHOL 177 03/14/2013   TRIG 100 03/14/2013   HDL 61 03/14/2013   LDLCALC 96 03/14/2013   ALT 23 05/13/2013   AST 13 05/13/2013   NA 138 07/09/2013   K 3.7 07/09/2013   CL 100 07/09/2013   CREATININE 1.19* 07/09/2013   BUN 37* 07/09/2013   CO2 27 07/09/2013   TSH 0.013* 03/15/2013   INR 0.97 07/06/2013   HGBA1C 6.9 05/22/2013   Echo Study Conclusions from July 2014  - Left ventricle: The cavity size was normal. Systolic function was normal. The estimated  ejection fraction was in the range of 55% to 60%. Doppler parameters are consistent with abnormal left ventricular relaxation (grade 1 diastolic dysfunction). - Mitral valve: Mildly calcified annulus.   Coronary angiography:  Coronary dominance: right  Left mainstem: Normal  Left anterior descending (LAD): Large vessel. There is 40% disease prior to  the first diagonal with focal 80-85% disease immediately after the first diagonal. The first diagonal is a moderate size vessel that bifurcates. It has no significant disease.  Left circumflex (LCx): The LCx gives rise to a single large OM. There is 20% disease in the proximal LCx.  Right coronary artery (RCA): The RCA is diffusely and severely diseased in the mid vessel. It is occluded distally with left to right collaterals.  Left ventriculography: Left ventricular systolic function is abnormal, LVEF is estimated at 30-35%, There is akinesis of the basal to mid inferior wall and severe hypokinesis of the anterior wall, there is no significant mitral regurgitation  Final Conclusions:  1. Severe 2 vessel obstructive CAD. The RCA is chronically occluded distally with collaterals. There is a moderate to severe stenosis in the LAD.  2. Severe LV dysfunction with high filling pressures.  Recommendations: Aggressive medical management of CAD and CHF. Needs IV diuresis. Will increase nitrates and add low dose bisoprolol. Add ACEi if renal function stable. Could consider stenting of the LAD if the patient remains symptomatic despite optimal medical therapy.  Theron Arista [facility]  07/07/2013, 11:21 AM  Assessment / Plan: 1. NSTEMI - 2 vessel obstructive disease - to be managed medically. Could consider stenting of the LAD if she remains symptomatic despite optimal medical therapy - she is currently not having chest pain.   2. Combined systolic and diastolic HF - EF now down to 30 to 35% - not clear to me as to what her plan of care and the expectations for  her are - ?just conservative.   3. COPD  4. Vocal cord nodule  5. Metastatic breast cancer  Check labs today. See her back in a month with Dr. Antoine Poche.  Patient is agreeable to this plan and will call if any problems develop in the interim.   Rosalio Macadamia, RN, ANP-C [facility] [address] [place], [place]  [address]

## 2013-07-17 NOTE — Patient Instructions (Addendum)
Stay on your current medicines  We need to recheck labs today  I will see you in a month on a day that Dr. Antoine Poche is here  Call the [facility] office at [phone] if you have any questions, problems or concerns.

## 2013-07-20 ENCOUNTER — Ambulatory Visit (HOSPITAL_COMMUNITY): Payer: Medicare HMO

## 2013-07-21 ENCOUNTER — Other Ambulatory Visit: Payer: Self-pay | Admitting: Internal Medicine

## 2013-07-21 ENCOUNTER — Other Ambulatory Visit: Payer: Medicare HMO | Admitting: Lab

## 2013-07-21 ENCOUNTER — Telehealth: Payer: Self-pay | Admitting: Internal Medicine

## 2013-07-21 ENCOUNTER — Ambulatory Visit: Payer: Medicare HMO

## 2013-07-21 DIAGNOSIS — C50911 Malignant neoplasm of unspecified site of right female breast: Secondary | ICD-10-CM

## 2013-07-21 NOTE — Telephone Encounter (Signed)
Left message for patient to call back to re-schedule. She was a no-show.  She made to her cardiology appointment on 11/25.

## 2013-07-21 NOTE — Telephone Encounter (Signed)
Pt has a pet scan already scheduled for 08/10/2013.

## 2013-07-22 ENCOUNTER — Encounter: Payer: Self-pay | Admitting: Medical Oncology

## 2013-07-22 ENCOUNTER — Ambulatory Visit (HOSPITAL_COMMUNITY): Payer: Medicare HMO

## 2013-07-22 ENCOUNTER — Telehealth: Payer: Self-pay | Admitting: Internal Medicine

## 2013-07-22 NOTE — Telephone Encounter (Signed)
Pt called and r/s lab and MD visit for December 2014

## 2013-07-24 ENCOUNTER — Ambulatory Visit (HOSPITAL_COMMUNITY): Payer: Medicare HMO

## 2013-07-27 ENCOUNTER — Ambulatory Visit (HOSPITAL_COMMUNITY): Payer: Medicare HMO

## 2013-07-28 ENCOUNTER — Encounter: Payer: Self-pay | Admitting: *Deleted

## 2013-07-29 ENCOUNTER — Ambulatory Visit (HOSPITAL_COMMUNITY): Payer: Medicare HMO

## 2013-07-31 ENCOUNTER — Ambulatory Visit (HOSPITAL_COMMUNITY): Payer: Medicare HMO

## 2013-08-03 ENCOUNTER — Ambulatory Visit (HOSPITAL_COMMUNITY): Payer: Medicare HMO

## 2013-08-04 NOTE — Telephone Encounter (Signed)
S/w pt aware of lab results and to increase potassium bid.  Pt stated was up all night urinating because of lasix was taking pill at 6:00 pm I stated to take Lasix at 3:00 pm pt stated verbal understanding and agreeable with plan

## 2013-08-05 ENCOUNTER — Telehealth: Payer: Self-pay

## 2013-08-05 ENCOUNTER — Ambulatory Visit (HOSPITAL_COMMUNITY): Payer: Medicare HMO

## 2013-08-05 NOTE — Telephone Encounter (Signed)
Pt asking about PET ordered for Monday. She is asking if this is the test Dr Haroldine Laws wants. She says she does not want a full body test just a test that focuses on neck and vocal cords. I said I will discuss with Dr Rosie Fate but this may be the test of choice.  S/W Dr Haroldine Laws and he states that the PET is the test he wants done. Called pt back and clarified with her. She was thinking that it was like CT where they order body areas separately. Corrected this idea and she was willing to do PET.

## 2013-08-06 ENCOUNTER — Encounter: Payer: Self-pay | Admitting: Internal Medicine

## 2013-08-06 NOTE — Progress Notes (Signed)
[employer] approved pet @ [facility] 08/10/13 auth # [id number]

## 2013-08-07 ENCOUNTER — Ambulatory Visit (HOSPITAL_COMMUNITY): Payer: Medicare HMO

## 2013-08-10 ENCOUNTER — Encounter: Payer: Self-pay | Admitting: *Deleted

## 2013-08-10 ENCOUNTER — Encounter (HOSPITAL_COMMUNITY)
Admission: RE | Admit: 2013-08-10 | Discharge: 2013-08-10 | Disposition: A | Discharge status: Home / Self Care | Payer: Medicare HMO | Source: Ambulatory Visit | Attending: Internal Medicine | Admitting: Internal Medicine

## 2013-08-10 ENCOUNTER — Ambulatory Visit (HOSPITAL_COMMUNITY): Payer: Medicare HMO

## 2013-08-10 ENCOUNTER — Encounter (HOSPITAL_COMMUNITY): Payer: Self-pay

## 2013-08-10 DIAGNOSIS — I517 Cardiomegaly: Secondary | ICD-10-CM | POA: Insufficient documentation

## 2013-08-10 DIAGNOSIS — N281 Cyst of kidney, acquired: Secondary | ICD-10-CM | POA: Insufficient documentation

## 2013-08-10 DIAGNOSIS — I2510 Atherosclerotic heart disease of native coronary artery without angina pectoris: Secondary | ICD-10-CM | POA: Insufficient documentation

## 2013-08-10 DIAGNOSIS — M899 Disorder of bone, unspecified: Secondary | ICD-10-CM | POA: Insufficient documentation

## 2013-08-10 DIAGNOSIS — C320 Malignant neoplasm of glottis: Secondary | ICD-10-CM | POA: Insufficient documentation

## 2013-08-10 DIAGNOSIS — R918 Other nonspecific abnormal finding of lung field: Secondary | ICD-10-CM | POA: Insufficient documentation

## 2013-08-10 DIAGNOSIS — R599 Enlarged lymph nodes, unspecified: Secondary | ICD-10-CM | POA: Insufficient documentation

## 2013-08-10 DIAGNOSIS — K7689 Other specified diseases of liver: Secondary | ICD-10-CM | POA: Insufficient documentation

## 2013-08-10 DIAGNOSIS — N200 Calculus of kidney: Secondary | ICD-10-CM | POA: Insufficient documentation

## 2013-08-10 DIAGNOSIS — C50911 Malignant neoplasm of unspecified site of right female breast: Secondary | ICD-10-CM

## 2013-08-10 LAB — GLUCOSE, CAPILLARY: Glucose-Capillary: 136 mg/dL — ABNORMAL HIGH (ref 70–99)

## 2013-08-10 MED ORDER — FLUDEOXYGLUCOSE F - 18 (FDG) INJECTION
18.4000 | Freq: Once | INTRAVENOUS | Status: AC | PRN
  Administered 2013-08-10: 18.4 via INTRAVENOUS

## 2013-08-10 NOTE — Progress Notes (Signed)
Met with patient in Radiology where she was waiting for 1:00 PET.  Introduced myself as a Haematologist from [employer] who works with Dr. Rosie Fate and Dr. Haroldine Laws.  She stated she had a "nodule on my vocal cord" and that was why she was having the PET.  I provided her one of my business cards and stated I would follow-up with her pending results of the scan.  She indicated understanding.  Young Berry, RN, BSN, [facility] On[facility] Navigator [phone]

## 2013-08-11 ENCOUNTER — Telehealth: Payer: Self-pay | Admitting: Nurse Practitioner

## 2013-08-11 ENCOUNTER — Ambulatory Visit: Payer: Medicare HMO

## 2013-08-11 ENCOUNTER — Ambulatory Visit (INDEPENDENT_AMBULATORY_CARE_PROVIDER_SITE_OTHER): Payer: Medicare HMO | Admitting: Nurse Practitioner

## 2013-08-11 ENCOUNTER — Other Ambulatory Visit: Payer: Self-pay | Admitting: *Deleted

## 2013-08-11 ENCOUNTER — Other Ambulatory Visit: Payer: Medicare HMO | Admitting: Lab

## 2013-08-11 ENCOUNTER — Encounter: Payer: Self-pay | Admitting: Nurse Practitioner

## 2013-08-11 VITALS — BP 150/70 | HR 90 | Ht 62.0 in | Wt 234.8 lb

## 2013-08-11 DIAGNOSIS — I259 Chronic ischemic heart disease, unspecified: Secondary | ICD-10-CM

## 2013-08-11 DIAGNOSIS — I5041 Acute combined systolic (congestive) and diastolic (congestive) heart failure: Secondary | ICD-10-CM

## 2013-08-11 DIAGNOSIS — I5032 Chronic diastolic (congestive) heart failure: Secondary | ICD-10-CM

## 2013-08-11 LAB — BASIC METABOLIC PANEL
BUN: 21 mg/dL (ref 6–23)
CO2: 28 mEq/L (ref 19–32)
Calcium: 10.1 mg/dL (ref 8.4–10.5)
Chloride: 102 mEq/L (ref 96–112)
Creatinine, Ser: 1 mg/dL (ref 0.4–1.2)
GFR: 58.24 mL/min — ABNORMAL LOW (ref >60.00)
Glucose, Bld: 125 mg/dL — ABNORMAL HIGH (ref 70–99)
Potassium: 3.7 mEq/L (ref 3.5–5.1)
Sodium: 137 mEq/L (ref 135–145)

## 2013-08-11 MED ORDER — FUROSEMIDE 40 MG PO TABS: 40.0000 mg | ORAL_TABLET | Freq: Two times a day (BID) | ORAL | Status: DC

## 2013-08-11 MED ORDER — ISOSORBIDE MONONITRATE ER 30 MG PO TB24: 30.0000 mg | ORAL_TABLET | Freq: Two times a day (BID) | ORAL | Status: DC

## 2013-08-11 MED ORDER — LOSARTAN POTASSIUM 25 MG PO TABS: 25.0000 mg | ORAL_TABLET | Freq: Every day | ORAL | Status: DC

## 2013-08-11 NOTE — Telephone Encounter (Signed)
New message  Patient was in the office today and has questions regarding Larsartin she was given, please call and advise.

## 2013-08-11 NOTE — Progress Notes (Addendum)
Anita [name] Date of Birth: 25-Sep-1945 Medical Record #[id number]  History of Present Illness: Anita [name] is seen back today for a one month check. Seen for Dr. Antoine Poche. 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 ER 100%/ PR 0%/Her2 neu negative, and HLD. Other issues as listed below.   Admitted back in early November with shortness of breath - acute on chronic combined systolic and diastolic HF. Had positive cardiac enzymes - consistent with NSTEMI. She was cathed - this showed 2 vessel disease (occluded RCA and moderate LAD stenosis which is to be managed medically. Did have increased LVEDP secondry to increased salt. Her HF was felt to be triggered by her ACS. She was diuresed. Discharged on 07/09/2013.   Seen about 3 weeks ago - was doing ok. It was not clear to me as to what the plan for her heart failure is - she is not on ACE - has some CKD - was tried on Zebeta but not discharged on it.   Comes back today. Here alone. She says her Demedex is not working for her - wants something different. Breathing is ok. Using her oxygen - with walking in here - sats down to 85%. Has had no chest pain up until last night - says she went to a Christmas party - ate too much - had pain in the left breast - could not find her NTG so she called EMS - negative EKG. NTG x 1 with relief. Thinks it was indigestion but sounds more like angina. Has not recurred and she has found her NTG under her Christmas tree. Weight is down but she still has swelling in her legs/feet. Not dizzy. Had her PET scan earlier this week - it looked ok.    Current Outpatient Prescriptions  Medication Sig Dispense Refill  . albuterol (PROVENTIL HFA;VENTOLIN HFA) 108 (90 BASE) MCG/ACT inhaler Inhale 1 puff into the lungs 2 (two) times daily as needed for wheezing or shortness of breath.      [name] aspirin EC 81 MG EC tablet Take 1 tablet (81 mg total) by mouth daily.  30 tablet  3  .  atorvastatin (LIPITOR) 40 MG tablet Take 1 tablet (40 mg total) by mouth daily at 6 PM.  30 tablet  5  . Calcium Citrate (CALCITRATE PO) Take 1 tablet by mouth daily.       [name] docusate sodium (COLACE) 100 MG capsule Take 1 capsule (100 mg total) by mouth 2 (two) times daily as needed for constipation.  60 capsule  3  . isosorbide mononitrate (IMDUR) 30 MG 24 hr tablet Take 30 mg by mouth daily.      [name] letrozole (FEMARA) 2.5 MG tablet Take 1 tablet (2.5 mg total) by mouth daily.  30 tablet  1  . nitroGLYCERIN (NITROSTAT) 0.4 MG SL tablet Place 1 tablet (0.4 mg total) under the tongue every 5 (five) minutes as needed for chest pain.  30 tablet  0  . nystatin (MYCOSTATIN/NYSTOP) 100000 UNIT/GM POWD Apply to groin two times a day  15 g  0  . Potassium Chloride (KLOR-CON PO) Take 20 mEq by mouth daily.       [name] tiotropium (SPIRIVA HANDIHALER) 18 MCG inhalation capsule Place 1 capsule (18 mcg total) into inhaler and inhale daily.  30 capsule  6  . torsemide (DEMADEX) 20 MG tablet Take 1 tablet (20 mg total) by mouth 2 (two) times daily.  60 tablet  0   No current facility-administered medications for this visit.    Allergies  Allergen Reactions  . Omnipaque [Iohexol]     Pt claims she developed hives after given contrast  . Benzene Rash    Past Medical History  Diagnosis Date  . Fibroid   . Morbid obesity   . 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 COPD (chronic obstructive pulmonary disease)   . Chronic diastolic CHF (congestive heart failure)   . NSTEMI (non-ST elevated myocardial infarction)     a. NSTEMI 08/2012: med rx; nuc 10/2012: low risk (old scar, no significant ischemia, EF 49% with inf wall HK). b. NSTEMI 02/2013: managed med, pt declined cath at that time.  . Anemia     a. Adm 09/2012 with melena, Hgb 5.8 -> transfused. EGD/colonoscopy unrevealing.  . Breast cancer     a. Mets to bone. ER 100%/ PR 0%/Her2 neu negative.  [name] Ulcers of both lower  legs   . Tobacco abuse   . Hyperlipidemia 02/11/2013  . Diabetes mellitus without complication   . Chronic respiratory failure     a. On O2 qhs. also portable O2.  . Chronic renal insufficiency   . Lesion of vocal cord     a. CT 05/2013 concerning for tumor.    Past Surgical History  Procedure Laterality Date  . Colonoscopy, esophagogastroduodenoscopy (egd) and esophageal dilation N/A 10/13/2012    Procedure: colonoscopy and egd;  Surgeon: Graylin Shiver, MD;  Location: [facility] ENDOSCOPY;  Service: Endoscopy;  Laterality: N/A;    History  Smoking status  . Former Smoker -- 2.00 packs/day for 40 years  . Types: Cigarettes  . Quit date: 09/24/2012  Smokeless tobacco  . Never Used    History  Alcohol Use No    Family History  Problem Relation Age of Onset  . Cancer Mother     leukemia  . Hypertension Mother   . Diabetes Father   . Heart disease Father     passed away due to heart attack    Review of Systems: The review of systems is per the HPI.  All other systems were reviewed and are negative.  Physical Exam: BP 150/70  Pulse 90  Ht 5\' 2"  (1.575 m)  Wt 234 lb 12.8 oz (106.505 kg)  BMI 42.93 kg/m2  SpO2 85% Weight down 3 pounds since last visit and down 5 pounds over the past month. Patient is very pleasant and in no acute distress. She is obese. Skin is warm and dry. Color is normal.  HEENT is unremarkable. Normocephalic/atraumatic. PERRL. Sclera are nonicteric. Neck is supple. No masses. No JVD. Lungs show decreased breath sounds. Cardiac exam shows a regular rate and rhythm. Abdomen is soft. Extremities with 1+ edema. Gait and ROM are intact. No gross neurologic deficits noted.  Wt Readings from Last 3 Encounters:  08/11/13 234 lb 12.8 oz (106.505 kg)  07/17/13 237 lb 1.9 oz (107.557 kg)  07/09/13 239 lb 3.2 oz (108.5 kg)     LABORATORY DATA: BMET is pending   Lab Results  Component Value Date   WBC 9.3 07/17/2013   HGB 11.0* 07/17/2013   HCT 33.2*  07/17/2013   PLT 319.0 07/17/2013   GLUCOSE 169* 07/17/2013   CHOL 177 03/14/2013   TRIG 100 03/14/2013   HDL 61 03/14/2013   LDLCALC 96 03/14/2013   ALT 23 05/13/2013   AST 13 05/13/2013   NA 136  07/17/2013   K 3.2* 07/17/2013   CL 97 07/17/2013   CREATININE 1.3* 07/17/2013   BUN 26* 07/17/2013   CO2 29 07/17/2013   TSH 0.04* 07/17/2013   INR 0.97 07/06/2013   HGBA1C 6.9 05/22/2013    Echo Study Conclusions from July 2014  - Left ventricle: The cavity size was normal. Systolic function was normal. The estimated ejection fraction was in the range of 55% to 60%. Doppler parameters are consistent with abnormal left ventricular relaxation (grade 1 diastolic dysfunction). - Mitral valve: Mildly calcified annulus.   Coronary angiography:  Coronary dominance: right  Left mainstem: Normal  Left anterior descending (LAD): Large vessel. There is 40% disease prior to the first diagonal with focal 80-85% disease immediately after the first diagonal. The first diagonal is a moderate size vessel that bifurcates. It has no significant disease.  Left circumflex (LCx): The LCx gives rise to a single large OM. There is 20% disease in the proximal LCx.  Right coronary artery (RCA): The RCA is diffusely and severely diseased in the mid vessel. It is occluded distally with left to right collaterals.  Left ventriculography: Left ventricular systolic function is abnormal, LVEF is estimated at 30-35%, There is akinesis of the basal to mid inferior wall and severe hypokinesis of the anterior wall, there is no significant mitral regurgitation  Final Conclusions:  1. Severe 2 vessel obstructive CAD. The RCA is chronically occluded distally with collaterals. There is a moderate to severe stenosis in the LAD.  2. Severe LV dysfunction with high filling pressures.  Recommendations: Aggressive medical management of CAD and CHF. Needs IV diuresis. Will increase nitrates and add low dose bisoprolol. Add ACEi if  renal function stable. Could consider stenting of the LAD if the patient remains symptomatic despite optimal medical therapy.  [facility]  07/07/2013, 11:21 AM   PET IMPRESSION: No evidence for hypermetabolic soft tissue in the neck, chest, abdomen, or pelvis.  Stable appearance of diffuse bony sclerotic skeletal lesions without associated hyper metabolism. This is compatible with sequelae of treated bony metastatic disease.   Electronically Signed By: Kennith Center M.D. On: 08/10/2013 17:21   Assessment / Plan:  1. NSTEMI - 2 vessel obstructive disease - to be managed medically. Could consider stenting of the LAD if she remains symptomatic despite optimal medical therapy - she seems to be doing ok except for this recent bout - will increase her Imdur to BID. Would hope to continue with medical management.  2. Combined systolic and diastolic HF - EF now down to 30 to 35% - have discussed her plan of care with Dr. Antoine Poche who subsequently saw Anita [name] - he would like to continue with conservative management. Try to add low dose Losartan. Change demedex to Lasix. See back in 2 to 3 weeks. See Dr. Antoine Poche in 6 weeks. Check BMET today.  3. COPD - on oxygen - sees pulmonary later this month as well. Needs to stay on her oxygen.  4. Vocal cord nodule   5. Metastatic breast cancer - recent PET scan - seeing oncology later this month.   Patient is agreeable to this plan and will call if any problems develop in the interim.   Rosalio Macadamia, RN, ANP-C  [facility]  [address]  [place], [place] [address]  Addendum: OV note from Dr. Haroldine Laws seen today on 08/19/2013 - apparently her PET scan was relooked at - there was felt to be activity in the  vocal cord. Dr. Roselind Messier will be reviewing and possible referral for localized radiation therapy. She is felt to be quite fragile. It was his feeling that if this area was not treated then she  would develop an airway issue that would lead to trach along with further medical care.

## 2013-08-11 NOTE — Telephone Encounter (Signed)
S/w pt found out Losartan that was prescribed at pt's ov was for htn which was stated from the pharmacist. Pt stated will not take this medication doesn't have HTN. S/w Dr. Patty Sermons medication is also for CHF but I stated to pt will route this to Norma Fredrickson, NP  For advise and get back to pt in the am.  Pt wants me to call [facility] and see if they will take medication back stated hasn't open medication.  Also pt wants me to call all medications in to [facility] pt's mail order.

## 2013-08-11 NOTE — Patient Instructions (Signed)
Stop the Torsemide  Start Lasix 40 mg two times a day  Increase the Imdur to two times a day  Start Losartan 25 mg a day  Keep avoiding the salt  I will see you in 2 to 3 weeks  See Dr. Antoine Poche in 4 to 6 weeks  We will check labs  Call the [facility] office at [phone] if you have any questions, problems or concerns.

## 2013-08-12 ENCOUNTER — Encounter: Payer: Self-pay | Admitting: Pulmonary Disease

## 2013-08-12 ENCOUNTER — Ambulatory Visit (HOSPITAL_COMMUNITY): Payer: Medicare HMO

## 2013-08-12 ENCOUNTER — Other Ambulatory Visit: Payer: Self-pay | Admitting: Internal Medicine

## 2013-08-12 ENCOUNTER — Ambulatory Visit (INDEPENDENT_AMBULATORY_CARE_PROVIDER_SITE_OTHER): Payer: Medicare HMO | Admitting: Pulmonary Disease

## 2013-08-12 ENCOUNTER — Other Ambulatory Visit: Payer: Self-pay | Admitting: *Deleted

## 2013-08-12 VITALS — BP 124/66 | HR 69 | Ht 62.0 in | Wt 233.0 lb

## 2013-08-12 DIAGNOSIS — J9610 Chronic respiratory failure, unspecified whether with hypoxia or hypercapnia: Secondary | ICD-10-CM

## 2013-08-12 DIAGNOSIS — I5032 Chronic diastolic (congestive) heart failure: Secondary | ICD-10-CM

## 2013-08-12 DIAGNOSIS — J449 Chronic obstructive pulmonary disease, unspecified: Secondary | ICD-10-CM

## 2013-08-12 MED ORDER — UMECLIDINIUM-VILANTEROL 62.5-25 MCG/INH IN AEPB: 1.0000 | INHALATION_SPRAY | Freq: Every day | RESPIRATORY_TRACT | Status: DC

## 2013-08-12 MED ORDER — ISOSORBIDE MONONITRATE ER 30 MG PO TB24: 30.0000 mg | ORAL_TABLET | Freq: Two times a day (BID) | ORAL | Status: DC

## 2013-08-12 MED ORDER — LOSARTAN POTASSIUM 25 MG PO TABS: 25.0000 mg | ORAL_TABLET | Freq: Every day | ORAL | Status: DC

## 2013-08-12 MED ORDER — FUROSEMIDE 40 MG PO TABS: 40.0000 mg | ORAL_TABLET | Freq: Two times a day (BID) | ORAL | Status: DC

## 2013-08-12 NOTE — Telephone Encounter (Signed)
Follow up    Pt needs a call back about med Losartan.

## 2013-08-12 NOTE — Patient Instructions (Signed)
Stop spiriva Anoro one puff daily Follow up in 6 months

## 2013-08-12 NOTE — Progress Notes (Signed)
Chief Complaint  Patient presents with  . Follow-up    reports breathing is doing well since last.  would like to discuss Anoro vs O2.  wears O2 at night and w/ activity    History of Present Illness: Anita [name] is a 67 y.o. female former smoker with COPD and possible sleep apnea using supplemental oxygen at night.  She also has history of stage IV breast cancer, and systolic/diastolic CHF.  She is doing okay with her breathing.  She still gets winded with activity.  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  PET scan from this month did not show hypermetabolic activity in the soft tissue area of the neck.  She did have diffuse sclerotic appearance of the bones.  She uses spiriva, and this helps.  She heard about anoro, and wants to know if this could be a better option for her.  She also wants to know if oxygen would work better for her breathing compared to inhalers.   TESTS: Echo 09/25/12 >> grade 2 diastolic dysfunction, mild RV dilation, EF 55% CT chest 10/08/12 >> 6 mm GGO nodule RUL, diffuse sclerotic changes on bone windows ONO with RA 10/23/12 >> Test time 7 hrs 53 min. Mean SpO2 89.3%, low SpO2 66%. Spent 46 min with SpO2 < 88%.  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  Anita [name]  has past surgical history that includes Colonoscopy, esophagogastroduodenoscopy (egd) and esophageal dilation (N/A, 10/13/2012).  Prior to Admission medications   Medication Sig Start Date End Date Taking?  Authorizing Provider  albuterol (PROVENTIL HFA;VENTOLIN HFA) 108 (90 BASE) MCG/ACT inhaler Inhale 1 puff into the lungs 2 (two) times daily as needed for wheezing or shortness of breath.    Historical Provider, MD  aspirin EC 81 MG EC tablet Take 1 tablet (81 mg total) by mouth daily. 10/02/12   Glori Luis, MD  atorvastatin (LIPITOR) 40 MG tablet Take 1 tablet (40 mg total) by mouth daily at 6 PM. 10/02/12   Glori Luis, MD  Calcium Citrate (CALCITRATE PO) Take 1 tablet by mouth daily.    Historical Provider, MD  pantoprazole (PROTONIX) 40 MG tablet Take 1 tablet (40 mg total) by mouth daily. 10/14/12   Glori Luis, MD  torsemide (DEMADEX) 20 MG tablet Take 1 tablet (20 mg total) by mouth daily. 10/15/12   Glori Luis, MD    Allergies  Allergen Reactions  . Omnipaque [Iohexol]     Pt claims she developed hives after given contrast  . Benzene Rash     Physical Exam:  General - No distress ENT - No sinus tenderness, MP 4, no oral exudate, no LAN Cardiac - s1s2 regular, no murmur Chest - No wheeze/rales/dullness Back - No focal tenderness Abd - Soft, non-tender Ext - 1+ edema Neuro - Normal strength Skin - No rashes Psych - normal mood, and behavior   Assessment/Plan:  Coralyn Helling, MD [place] [facility] Pager:  [phone] 08/12/2013, 4:45 PM

## 2013-08-12 NOTE — Telephone Encounter (Signed)
Follow up     Pt still waiting on a call back about Losartain and giving it back.   Pt has an appt @ 4 pm with Dr Craige Cotta, she would like to call back before she goes.

## 2013-08-12 NOTE — Telephone Encounter (Signed)
Please tell her that while this is a blood pressure drug, it is also used for heart failure/weakness of the heart muscle - which she has. Dr. Antoine Poche would really like for her to try.

## 2013-08-12 NOTE — Telephone Encounter (Signed)
Follow Up  Pt called states that she was expecting a call at 10 am from the nurse// Please call back to assist.

## 2013-08-12 NOTE — Telephone Encounter (Signed)
S/w pt will start taking losartan I explained med was an arb for chf to make heart muscle stronger  And Dr. Antoine Poche wanted pt to take med pt agreeable to plan and also requested to send in imdur, lasix, and losartan to [facility] sent into today

## 2013-08-13 ENCOUNTER — Encounter: Payer: Self-pay | Admitting: Family Medicine

## 2013-08-13 ENCOUNTER — Telehealth: Payer: Self-pay | Admitting: Family Medicine

## 2013-08-13 ENCOUNTER — Ambulatory Visit (INDEPENDENT_AMBULATORY_CARE_PROVIDER_SITE_OTHER): Payer: Medicare HMO | Admitting: Family Medicine

## 2013-08-13 VITALS — BP 134/70 | HR 101 | Temp 98.7°F | Ht 62.0 in | Wt 233.0 lb

## 2013-08-13 DIAGNOSIS — B373 Candidiasis of vulva and vagina: Secondary | ICD-10-CM

## 2013-08-13 DIAGNOSIS — I5042 Chronic combined systolic (congestive) and diastolic (congestive) heart failure: Secondary | ICD-10-CM

## 2013-08-13 DIAGNOSIS — E119 Type 2 diabetes mellitus without complications: Secondary | ICD-10-CM

## 2013-08-13 DIAGNOSIS — C50919 Malignant neoplasm of unspecified site of unspecified female breast: Secondary | ICD-10-CM

## 2013-08-13 DIAGNOSIS — I509 Heart failure, unspecified: Secondary | ICD-10-CM

## 2013-08-13 DIAGNOSIS — N760 Acute vaginitis: Secondary | ICD-10-CM

## 2013-08-13 DIAGNOSIS — I2510 Atherosclerotic heart disease of native coronary artery without angina pectoris: Secondary | ICD-10-CM

## 2013-08-13 DIAGNOSIS — C7951 Secondary malignant neoplasm of bone: Secondary | ICD-10-CM

## 2013-08-13 DIAGNOSIS — L304 Erythema intertrigo: Secondary | ICD-10-CM

## 2013-08-13 DIAGNOSIS — L538 Other specified erythematous conditions: Secondary | ICD-10-CM

## 2013-08-13 DIAGNOSIS — J9610 Chronic respiratory failure, unspecified whether with hypoxia or hypercapnia: Secondary | ICD-10-CM

## 2013-08-13 LAB — POCT WET PREP (WET MOUNT)

## 2013-08-13 LAB — T4, FREE: Free T4: 1.68 ng/dL (ref 0.80–1.80)

## 2013-08-13 LAB — TSH: TSH: 0.008 u[IU]/mL — ABNORMAL LOW (ref 0.350–4.500)

## 2013-08-13 LAB — T3, FREE: T3, Free: 4 pg/mL (ref 2.3–4.2)

## 2013-08-13 MED ORDER — DOXYCYCLINE HYCLATE 100 MG PO TABS: 100.0000 mg | ORAL_TABLET | Freq: Two times a day (BID) | ORAL | Status: DC

## 2013-08-13 MED ORDER — CLOTRIMAZOLE 1 % EX CREA: 1.0000 "application " | TOPICAL_CREAM | Freq: Two times a day (BID) | CUTANEOUS | Status: DC

## 2013-08-13 MED ORDER — TERCONAZOLE 0.8 % VA CREA: 1.0000 | TOPICAL_CREAM | Freq: Every day | VAGINAL | Status: DC

## 2013-08-13 NOTE — Telephone Encounter (Signed)
Pt called and would like all the medication that were prescribed today be sent to [facility] in [place] because the [facility] is out of all those medications until Monday. JW

## 2013-08-13 NOTE — Patient Instructions (Signed)
It was nice to see you today.  Please try and fill out that form and talk with your family regarding power of attorney, living will etc.  I have prescribed 3 agents for your itching/rash: Doxycycline, Terconazole, and Clotrimazole.  Use as prescribed.  Follow up in 1 month.

## 2013-08-14 ENCOUNTER — Ambulatory Visit (HOSPITAL_COMMUNITY): Payer: Medicare HMO

## 2013-08-14 MED ORDER — CLOTRIMAZOLE 1 % EX CREA: 1.0000 "application " | TOPICAL_CREAM | Freq: Two times a day (BID) | CUTANEOUS | Status: DC

## 2013-08-14 MED ORDER — DOXYCYCLINE HYCLATE 100 MG PO TABS: 100.0000 mg | ORAL_TABLET | Freq: Two times a day (BID) | ORAL | Status: DC

## 2013-08-14 MED ORDER — TERCONAZOLE 0.8 % VA CREA: 1.0000 | TOPICAL_CREAM | Freq: Every day | VAGINAL | Status: DC

## 2013-08-16 DIAGNOSIS — B3731 Acute candidiasis of vulva and vagina: Secondary | ICD-10-CM | POA: Insufficient documentation

## 2013-08-16 DIAGNOSIS — B373 Candidiasis of vulva and vagina: Secondary | ICD-10-CM | POA: Insufficient documentation

## 2013-08-16 DIAGNOSIS — L304 Erythema intertrigo: Secondary | ICD-10-CM | POA: Insufficient documentation

## 2013-08-16 NOTE — Assessment & Plan Note (Signed)
I have had several conversations with her about optimization of her medical regimen and she has adamantly refused addition of medications. Cardiology has finally gotten through and Mrs. Montavon has agreed to additional medications.   Patient now on Losartan.  No beta blocker at this time due to underlying lung disease.   Patient stable today.  Weight at baseline (233).  Patient to continue Lasix, Losartan.

## 2013-08-16 NOTE — Assessment & Plan Note (Signed)
Had in-depth discussion with patient today. Discussed recent PET scan findings and attempted to determine patients goals and wishes regarding treatment. Patient states that she wants "everything done." However, she does not want surgery or chemotherapy. I encouraged patient to discuss her care and wishes with her family and get started on Healthcare POA/Living will.

## 2013-08-16 NOTE — Assessment & Plan Note (Signed)
Patient remains on O2 and is now on Anoro. Stable at this time.

## 2013-08-16 NOTE — Assessment & Plan Note (Addendum)
Skin exam findings consistent with yeast intertrigo with secondary bacterial infection (secondary to excoriation). Will treat with Clotrimazole and Doxycycline.

## 2013-08-16 NOTE — Assessment & Plan Note (Signed)
Patient continues to be managed with Medical therapy only. Patient now s/p NSTEMI x 3.  Will continue ASA, Imdur, Losartan. Consider Plavix in lieu of Aspirin as patient has continued to have vascular events.

## 2013-08-16 NOTE — Assessment & Plan Note (Signed)
Patient refused pelvic exam today and thus blind vaginal swab was obtained. Wet prep done off of swab revealed + clue cells and few yeast. Given clinical picture, will treat for yeast with Terconazole.  Holding off on treatment for BV as patient was started on several medications including Doxycycline.

## 2013-08-16 NOTE — Progress Notes (Signed)
Subjective:     Patient ID: MOLLEY HOUSER, female   DOB: 11-06-1945, 67 y.o.   MRN: [id number]  HPI Mrs. Kellogg is a 67 year old female with a complex PMH who presents for follow up today for several chronic issues.    1) CHF & CAD.  - Now combined diastolic and systolic CHF based on recent Cath (06/2013).  Patient now s/p NSTEMI x 3. - Patient is being followed by cardiologist Dr. Antoine Poche.  - Patient recently seen by cardiology and Torsemide changed to Lasix.  She was also started on Losartan and Imdur was increased.  Beta blocker not added due to severe Lung disease. - Patient doing well.  No increasing SOB, chest pain.  No weight gain. - Patient endorses compliance with medications.   2) Chronic respiratory failure - Secondary to severe COPD. - Patient continues to remain stable but is dependent on home O2 (nightly and with activity). - Patient followed by Pulm.  Spiriva recently D/C'd and patient started on Anoro. - She reports that she is doing well.  No increasing SOB.  3) Breast Cancer, metastasized to bone - After having seen me for hoarseness on 05/22/13 patient was sent to ENT for evaluation.  Vocal cord lesion was found and was suspicious for metastasis.   - Patient followed by Onc.  PET scan done on 12/15.  Lymph node in neck with hypermetabolic activity consistent with metastasis. - Patient continues to remain on Letrozole.  She has adamantly refused surgery and chemotherapy.  4) Concern for yeast infection - Patient reports that she has had a yeast infection since 07/06/13 (recent admission) - Patient reports vaginal itching and discharge. - She notes that the surrounding skin in the pelvic region and groin are red and inflamed. This area is also intensely itchy. - She has been using Nystatin without resolution.  Review of Systems     Objective:   Physical Exam Filed Vitals:   08/13/13 1428  BP: 134/70  Pulse: 101  Temp: 98.7 F (37.1 C)   Exam: General:  chronically ill appearing female in NAD.  Cardiovascular: RRR. No murmurs, rubs, or gallops. Respiratory: Bibasilar rales noted. No wheezing.   Abdomen: obese, soft, nontender, nondistended. Extremities: 1+ LE edema.  Skin:   Groin/Pelvic - diffuse erythema noted; numerous circular skin lesions noted secondary to  Excoriation.  Minimal drainage noted (no purulence); several lesion with central eschar.      Assessment/Plan:  See Problem List

## 2013-08-17 ENCOUNTER — Ambulatory Visit (HOSPITAL_COMMUNITY): Payer: Medicare HMO

## 2013-08-19 ENCOUNTER — Ambulatory Visit (HOSPITAL_COMMUNITY): Payer: Medicare HMO

## 2013-08-20 NOTE — Assessment & Plan Note (Signed)
Discussed the difference between inhaler therapy and supplemental oxygen with regard to her respiratory disorders.  She maintains her oxygen saturation during the day, and therefore does not need supplemental oxygen during the day at this time.  She is to continue 2 liters supplemental oxygen while asleep.

## 2013-08-20 NOTE — Assessment & Plan Note (Signed)
Will change her inhaler regimen from spiriva to anoro.  Have given her samples of anoro to try first.  She is to continue prn albuterol.

## 2013-08-21 ENCOUNTER — Ambulatory Visit (HOSPITAL_COMMUNITY): Payer: Medicare HMO

## 2013-08-24 ENCOUNTER — Encounter: Payer: Self-pay | Admitting: Nurse Practitioner

## 2013-08-24 ENCOUNTER — Ambulatory Visit (INDEPENDENT_AMBULATORY_CARE_PROVIDER_SITE_OTHER): Payer: Medicare HMO | Admitting: Nurse Practitioner

## 2013-08-24 ENCOUNTER — Ambulatory Visit (HOSPITAL_COMMUNITY): Payer: Medicare HMO

## 2013-08-24 ENCOUNTER — Encounter (INDEPENDENT_AMBULATORY_CARE_PROVIDER_SITE_OTHER): Payer: Self-pay

## 2013-08-24 VITALS — BP 120/80 | HR 108 | Ht 62.0 in | Wt 234.8 lb

## 2013-08-24 DIAGNOSIS — I2589 Other forms of chronic ischemic heart disease: Secondary | ICD-10-CM | POA: Diagnosis not present

## 2013-08-24 DIAGNOSIS — I255 Ischemic cardiomyopathy: Secondary | ICD-10-CM

## 2013-08-24 LAB — BASIC METABOLIC PANEL
BUN: 28 mg/dL — ABNORMAL HIGH (ref 6–23)
CO2: 30 mEq/L (ref 19–32)
Calcium: 9.9 mg/dL (ref 8.4–10.5)
Chloride: 100 mEq/L (ref 96–112)
Creatinine, Ser: 1.2 mg/dL (ref 0.4–1.2)
GFR: 47.73 mL/min — ABNORMAL LOW (ref >60.00)
Glucose, Bld: 182 mg/dL — ABNORMAL HIGH (ref 70–99)
Potassium: 3.2 mEq/L — ABNORMAL LOW (ref 3.5–5.1)
Sodium: 139 mEq/L (ref 135–145)

## 2013-08-24 NOTE — Patient Instructions (Signed)
We will check labs today  See Dr. Antoine Poche as planned  Stay on your current medicines  Call the [facility] office at [phone] if you have any questions, problems or concerns.

## 2013-08-24 NOTE — Progress Notes (Signed)
Anita [name] Date of Birth: 05-14-1946 Medical Record #[id number]  History of Present Illness: [name] is seen back today for a follow up visit. Seen for Dr. Antoine Poche. 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 ER 100%/ PR 0%/Her2 neu negative, and HLD. Other issues as listed below.   Admitted back in early November with shortness of breath - acute on chronic combined systolic and diastolic HF. Had positive cardiac enzymes - consistent with NSTEMI. She was cathed - this showed 2 vessel disease (occluded RCA and moderate LAD stenosis which is to be managed medically. Did have increased LVEDP secondry to increased salt. Her HF was felt to be triggered by her ACS. She was diuresed. Discharged on 07/09/2013.   Seen for her post hospital visit - was doing ok. It was not clear to me as to what the plan for her heart failure is - she is not on ACE - has some CKD - was tried on Zebeta but not discharged on it and she was not going to take.  Seen earlier this month. Said her Demedex was not working for her - wanted something different. Had had some chest pain that sounded like angina. Had also had a PET scan - initially appeared ok but has been reviewed and she does have apparent activity noted on the vocal cord nodule - will probably be looking at radiation therapy.    Comes back today. Here with her grand daughter. She notes that she is feeling better. Breathing is ok. Likes taking the Lasix instead of the Demedex. Feels ok on the Losartan. Weight is stable. Swelling is stable.    Current Outpatient Prescriptions  Medication Sig Dispense Refill  . albuterol (PROVENTIL HFA;VENTOLIN HFA) 108 (90 BASE) MCG/ACT inhaler Inhale 1 puff into the lungs 2 (two) times daily as needed for wheezing or shortness of breath.      [name] aspirin EC 81 MG EC tablet Take 1 tablet (81 mg total) by mouth daily.  30 tablet  3  . atorvastatin (LIPITOR) 40 MG tablet Take 1  tablet (40 mg total) by mouth daily at 6 PM.  30 tablet  5  . Calcium Citrate (CALCITRATE PO) Take 1 tablet by mouth daily.       . clotrimazole (LOTRIMIN) 1 % cream Apply 1 application topically 2 (two) times daily.  30 g  0  . doxycycline (VIBRA-TABS) 100 MG tablet Take 1 tablet (100 mg total) by mouth 2 (two) times daily.  20 tablet  0  . furosemide (LASIX) 40 MG tablet Take 1 tablet (40 mg total) by mouth 2 (two) times daily.  180 tablet  3  . isosorbide mononitrate (IMDUR) 30 MG 24 hr tablet Take 1 tablet (30 mg total) by mouth 2 (two) times daily.  180 tablet  3  . letrozole (FEMARA) 2.5 MG tablet TAKE ONE TABLET BY MOUTH ONCE DAILY  30 tablet  0  . losartan (COZAAR) 25 MG tablet Take 1 tablet (25 mg total) by mouth daily.  90 tablet  3  . nitroGLYCERIN (NITROSTAT) 0.4 MG SL tablet Place 1 tablet (0.4 mg total) under the tongue every 5 (five) minutes as needed for chest pain.  30 tablet  0  . NON FORMULARY Place 3 L into the nose as needed.      . Potassium Chloride (KLOR-CON PO) Take 20 mEq by mouth daily.       [name] terconazole (TERAZOL 3) 0.8 %  vaginal cream Place 1 applicator vaginally at bedtime. For 3 days.  20 g  0  . Umeclidinium-Vilanterol (ANORO ELLIPTA) 62.5-25 MCG/INH AEPB Inhale 1 puff into the lungs daily.  60 each  5   No current facility-administered medications for this visit.    Allergies  Allergen Reactions  . Omnipaque [Iohexol]     Pt claims she developed hives after given contrast  . Benzene Rash    Past Medical History  Diagnosis Date  . Fibroid   . Morbid obesity   . 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 COPD (chronic obstructive pulmonary disease)   . Chronic diastolic CHF (congestive heart failure)   . NSTEMI (non-ST elevated myocardial infarction)     a. NSTEMI 08/2012: med rx; nuc 10/2012: low risk (old scar, no significant ischemia, EF 49% with inf wall HK). b. NSTEMI 02/2013: managed med, pt declined cath at that  time.  . Anemia     a. Adm 09/2012 with melena, Hgb 5.8 -> transfused. EGD/colonoscopy unrevealing.  . Breast cancer     a. Mets to bone. ER 100%/ PR 0%/Her2 neu negative.  [name] Ulcers of both lower legs   . Tobacco abuse   . Hyperlipidemia 02/11/2013  . Diabetes mellitus without complication   . Chronic respiratory failure     a. On O2 qhs. also portable O2.  . Chronic renal insufficiency   . Lesion of vocal cord     a. CT 05/2013 concerning for tumor.    Past Surgical History  Procedure Laterality Date  . Colonoscopy, esophagogastroduodenoscopy (egd) and esophageal dilation N/A 10/13/2012    Procedure: colonoscopy and egd;  Surgeon: Graylin Shiver, MD;  Location: [facility] ENDOSCOPY;  Service: Endoscopy;  Laterality: N/A;    History  Smoking status  . Former Smoker -- 2.00 packs/day for 40 years  . Types: Cigarettes  . Quit date: 09/24/2012  Smokeless tobacco  . Never Used    History  Alcohol Use No    Family History  Problem Relation Age of Onset  . Cancer Mother     leukemia  . Hypertension Mother   . Diabetes Father   . Heart disease Father     passed away due to heart attack    Review of Systems: The review of systems is per the HPI.  All other systems were reviewed and are negative.  Physical Exam: BP 120/80  Pulse 108  Ht 5\' 2"  (1.575 m)  Wt 234 lb 12.8 oz (106.505 kg)  BMI 42.93 kg/m2  SpO2 95% Patient is very pleasant and in no acute distress. Morbidly obese. Skin is warm and dry. Color is normal.  HEENT is unremarkable. Normocephalic/atraumatic. PERRL. Sclera are nonicteric. Neck is supple. No masses. No JVD. Lungs are clear. Cardiac exam shows a regular rate and rhythm. Heart tones are distant. Abdomen is obese but soft. Extremities are full with 1+edema. Gait and ROM are intact. No gross neurologic deficits noted.  Wt Readings from Last 3 Encounters:  08/24/13 234 lb 12.8 oz (106.505 kg)  08/13/13 233 lb (105.688 kg)  08/12/13 233 lb (105.688 kg)      LABORATORY DATA: BMET is pending   Lab Results  Component Value Date   WBC 9.3 07/17/2013   HGB 11.0* 07/17/2013   HCT 33.2* 07/17/2013   PLT 319.0 07/17/2013   GLUCOSE 125* 08/11/2013   CHOL 177 03/14/2013   TRIG 100 03/14/2013   HDL 61  03/14/2013   LDLCALC 96 03/14/2013   ALT 23 05/13/2013   AST 13 05/13/2013   NA 137 08/11/2013   K 3.7 08/11/2013   CL 102 08/11/2013   CREATININE 1.0 08/11/2013   BUN 21 08/11/2013   CO2 28 08/11/2013   TSH 0.008* 08/13/2013   INR 0.97 07/06/2013   HGBA1C 6.9 05/22/2013     Assessment / Plan: 1. NSTEMI - 2 vessel obstructive disease - to be managed medically. Could consider stenting of the LAD if she remains symptomatic despite optimal medical therapy - we increased her Imdur to BID at last visit - she has improved clinically and has had no further spells of chest pain.  Would hope to continue with medical management.   2. Combined systolic and diastolic HF - EF now down to 30 to 35% - have discussed her plan of care with Dr. Antoine Poche at her last visit. He would like to continue with conservative management. Low dose Losartan added at last visit and demedex changed to Lasix - she seems to be tolerating so far - I have left her on her current regimen - she does not wish to make changes at this time.   3. COPD - on oxygen - followed by pulmonary. Needs to stay on her oxygen.   4. Vocal cord nodule  Now looks like there was activity on PET scanning - seeing radiation oncology later this month.   5. Metastatic breast cancer - recent PET scan - seeing oncology later this month.   Will check BMET today. No change in medicines. I gave her a note about not being able to participate in PE for her school requirements due to her cardiac/pulmonary/oncologic issues.  Patient is agreeable to this plan and will call if any problems develop in the interim.   Rosalio Macadamia, RN, ANP-C  [facility]  [address]  [place], [place] [address]

## 2013-08-25 ENCOUNTER — Ambulatory Visit: Payer: Medicare HMO | Admitting: Nurse Practitioner

## 2013-08-26 ENCOUNTER — Telehealth: Payer: Self-pay | Admitting: Nurse Practitioner

## 2013-08-26 ENCOUNTER — Ambulatory Visit: Payer: Medicare HMO

## 2013-08-26 ENCOUNTER — Telehealth: Payer: Self-pay | Admitting: Internal Medicine

## 2013-08-26 ENCOUNTER — Other Ambulatory Visit (HOSPITAL_BASED_OUTPATIENT_CLINIC_OR_DEPARTMENT_OTHER): Payer: Medicare HMO

## 2013-08-26 ENCOUNTER — Ambulatory Visit (HOSPITAL_COMMUNITY): Payer: Medicare HMO

## 2013-08-26 ENCOUNTER — Ambulatory Visit (HOSPITAL_BASED_OUTPATIENT_CLINIC_OR_DEPARTMENT_OTHER): Payer: Medicare HMO | Admitting: Internal Medicine

## 2013-08-26 ENCOUNTER — Other Ambulatory Visit: Payer: Medicare HMO | Admitting: Lab

## 2013-08-26 VITALS — BP 141/72 | HR 95 | Temp 97.6°F | Resp 18 | Ht 62.0 in | Wt 235.1 lb

## 2013-08-26 DIAGNOSIS — C7951 Secondary malignant neoplasm of bone: Secondary | ICD-10-CM

## 2013-08-26 DIAGNOSIS — D638 Anemia in other chronic diseases classified elsewhere: Secondary | ICD-10-CM

## 2013-08-26 DIAGNOSIS — E119 Type 2 diabetes mellitus without complications: Secondary | ICD-10-CM

## 2013-08-26 DIAGNOSIS — C50919 Malignant neoplasm of unspecified site of unspecified female breast: Secondary | ICD-10-CM

## 2013-08-26 DIAGNOSIS — C50911 Malignant neoplasm of unspecified site of right female breast: Secondary | ICD-10-CM

## 2013-08-26 DIAGNOSIS — Z170 Estrogen receptor positive status [ER+]: Secondary | ICD-10-CM

## 2013-08-26 DIAGNOSIS — N189 Chronic kidney disease, unspecified: Secondary | ICD-10-CM

## 2013-08-26 DIAGNOSIS — E876 Hypokalemia: Secondary | ICD-10-CM

## 2013-08-26 DIAGNOSIS — N6310 Unspecified lump in the right breast, unspecified quadrant: Secondary | ICD-10-CM

## 2013-08-26 DIAGNOSIS — J383 Other diseases of vocal cords: Secondary | ICD-10-CM

## 2013-08-26 LAB — CBC WITH DIFFERENTIAL/PLATELET
BASO%: 0.6 % (ref 0.0–2.0)
Basophils Absolute: 0 10*3/uL (ref 0.0–0.1)
Eosinophils Absolute: 0.2 10*3/uL (ref 0.0–0.5)
HGB: 11 g/dL — ABNORMAL LOW (ref 11.6–15.9)
LYMPH%: 15.2 % (ref 14.0–49.7)
NEUT#: 5.6 10*3/uL (ref 1.5–6.5)
RDW: 18.8 % — ABNORMAL HIGH (ref 11.2–14.5)
lymph#: 1.2 10*3/uL (ref 0.9–3.3)

## 2013-08-26 LAB — COMPREHENSIVE METABOLIC PANEL (CC13)
Albumin: 3.6 g/dL (ref 3.5–5.0)
BUN: 36.2 mg/dL — ABNORMAL HIGH (ref 7.0–26.0)
CO2: 24 mEq/L (ref 22–29)
Calcium: 10.8 mg/dL — ABNORMAL HIGH (ref 8.4–10.4)
Chloride: 104 mEq/L (ref 98–109)
Glucose: 148 mg/dl — ABNORMAL HIGH (ref 70–140)
Potassium: 3.4 mEq/L — ABNORMAL LOW (ref 3.5–5.1)

## 2013-08-26 NOTE — Telephone Encounter (Signed)
Follow up    Pt called in stating Anita [name] called her today with her lab results.   Please call pt back with results.

## 2013-08-26 NOTE — Telephone Encounter (Signed)
gv and printed appt sched and avs for pt for March 2015...Marland Kitchenpt wanted all appts same day

## 2013-08-26 NOTE — Telephone Encounter (Signed)
Notified of lab results. 

## 2013-08-27 LAB — CANCER ANTIGEN 27.29: CA 27.29: 25 U/mL (ref 0–39)

## 2013-08-27 NOTE — Progress Notes (Signed)
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 ER 100%/ PR 0%/Her2 neu negative  CURRENT THERAPY:  Palliative letrozole in 11/2012. Biopsy only.   INTERVAL HISTORY: Anita [name] 68 y.o. female with a history of right breast cancer with metastases to the bone/lungs here for 3 month followup.  She was last seen by me on 05/18/2013.  She reports tolerating letrozole well without hot flash/night sweat, bone/muscle pain.  In November (10th - 13 th) she was admitted with shortness of breath and found to be in acute on chronic combined systolic and diastolic HF. She  had positive cardiac enzymes - consistent with NSTEMI. She was cathed demonstrating 2 vessel disease (occluded RCA and moderate LAD stenosis) with recommendations for medical management. She was followed up by cardiology with plans for stenting the LAD if she remains symptomatic.  Her most recent echo revealed an EF now down to 30 - 35%.  She was seen on 12/16 by cardiology and was using her oxygen and desaturated to 85%.  She stated anginal symptoms relieved with her nitroglycerin. She has been started on lasix and losartan.   She denies chest pain.  She has not noticed any more back pain compared to before therapy. She still has DOE from past COPD. She is no inhaler. However, she needs portable O2 when she ambulates since she cannot ambulate very far on room air. She has had an interim visit PET/CT as noted below.  MEDICAL HISTORY: Past Medical History  Diagnosis Date  . Fibroid   . Morbid obesity   . 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 COPD (chronic obstructive pulmonary disease)   . Chronic diastolic CHF (congestive heart failure)   . NSTEMI (non-ST elevated myocardial infarction)     a. NSTEMI 08/2012: med rx; nuc 10/2012: low risk (old scar, no significant ischemia, EF  49% with inf wall HK). b. NSTEMI 02/2013: managed med, pt declined cath at that time.  . Anemia     a. Adm 09/2012 with melena, Hgb 5.8 -> transfused. EGD/colonoscopy unrevealing.  . Breast cancer     a. Mets to bone. ER 100%/ PR 0%/Her2 neu negative.  [name] Ulcers of both lower legs   . Tobacco abuse   . Hyperlipidemia 02/11/2013  . Diabetes mellitus without complication   . Chronic respiratory failure     a. On O2 qhs. also portable O2.  . Chronic renal insufficiency   . Lesion of vocal cord     a. CT 05/2013 concerning for tumor.   INTERIM HISTORY: has DM2 (diabetes mellitus, type 2); COPD (chronic obstructive pulmonary disease); Chronic respiratory failure; Chronic combined systolic and diastolic congestive heart failure; CAD (coronary artery disease); Chronic kidney disease; Anemia of chronic disease; Breast mass, right; History of tobacco abuse; Breast cancer metastasized to bone; Hyperlipidemia; Insomnia; Lesion of vocal cord; Intertrigo; and Vaginal yeast infection on her problem list.    ALLERGIES:  is allergic to omnipaque and benzene.  MEDICATIONS: has a current medication list which includes the following prescription(s): albuterol, aspirin, atorvastatin, calcium citrate, clotrimazole, doxycycline, furosemide, isosorbide mononitrate, letrozole, losartan, nitroglycerin, NON FORMULARY, potassium chloride, terconazole, and umeclidinium-vilanterol.  SURGICAL HISTORY:  Past Surgical History  Procedure Laterality Date  . Colonoscopy, esophagogastroduodenoscopy (egd) and esophageal dilation N/A 10/13/2012    Procedure: colonoscopy and egd;  Surgeon: Wonda Horner,  MD;  Location: [facility];  Service: Endoscopy;  Laterality: N/A;   REVIEW OF SYSTEMS:   Constitutional: Denies fevers, chills or abnormal weight loss Eyes: Denies blurriness of vision Ears, nose, mouth, throat, and face: Denies mucositis or sore throat Respiratory: Denies cough, dyspnea or wheezes Cardiovascular: Denies  palpitation, chest discomfort or lower extremity swelling Gastrointestinal:  Denies nausea, heartburn or change in bowel habits Skin: Denies abnormal skin rashes Lymphatics: Denies new lymphadenopathy or easy bruising Neurological:Denies numbness, tingling or new weaknesses Behavioral/Psych: Mood is stable, no new changes  All other systems were reviewed with the patient and are negative.  PHYSICAL EXAMINATION: ECOG PERFORMANCE STATUS: 1 - Symptomatic but completely ambulatory  Blood pressure 141/72, pulse 95, temperature 97.6 F (36.4 C), temperature source Oral, resp. rate 18, height 5\' 2"  (1.575 m), weight 235 lb 1.6 oz (106.641 kg), SpO2 97.00%.  GENERAL:alert, no distress and comfortable morbidly obese woman SKIN: skin color, texture, turgor are normal, no rashes or significant lesions EYES: normal, Conjunctiva are pink and non-injected, sclera clear OROPHARYNX:no exudate, no erythema and lips, buccal mucosa, and tongue normal  NECK: supple, thyroid normal size, non-tender, without nodularity LYMPH:  no palpable lymphadenopathy in the cervical, axillary LUNGS: coarse breathing with slightly increased breathing rate HEART: regular rate & rhythm and no murmurs and no lower extremity edema BREAST: Right breast exam showed a right lower outer quadrant breast mass about 4-5cm without axillary adenopathy.  ABDOMEN:abdomen soft, non-tender and normal bowel sounds; obese Musculoskeletal:no cyanosis of digits and no clubbing  NEURO: alert & oriented x 3 with fluent speech, no focal motor/sensory deficits  LABORATORY DATA: CBC    Component Value Date/Time   WBC 7.7 08/26/2013 1245   WBC 9.3 07/17/2013 1435   RBC 4.37 08/26/2013 1245   RBC 4.45 07/17/2013 1435   RBC 3.18* 09/26/2012 0518   HGB 11.0* 08/26/2013 1245   HGB 11.0* 07/17/2013 1435   HCT 33.2* 08/26/2013 1245   HCT 33.2* 07/17/2013 1435   PLT 288 08/26/2013 1245   PLT 319.0 07/17/2013 1435   MCV 75.8* 08/26/2013 1245    MCV 74.7* 07/17/2013 1435   MCH 25.1 08/26/2013 1245   MCH 24.9* 07/09/2013 0510   MCHC 33.0 08/26/2013 1245   MCHC 33.2 07/17/2013 1435   RDW 18.8* 08/26/2013 1245   RDW 20.2* 07/17/2013 1435   LYMPHSABS 1.2 08/26/2013 1245   LYMPHSABS 1.1 07/17/2013 1435   MONOABS 0.7 08/26/2013 1245   MONOABS 0.6 07/17/2013 1435   EOSABS 0.2 08/26/2013 1245   EOSABS 0.1 07/17/2013 1435   BASOSABS 0.0 08/26/2013 1245   BASOSABS 0.0 07/17/2013 1435   CMP     Component Value Date/Time   NA 141 08/26/2013 1245   NA 139 08/24/2013 1539   K 3.4* 08/26/2013 1245   K 3.2* 08/24/2013 1539   CL 100 08/24/2013 1539   CL 101 02/11/2013 0950   CO2 24 08/26/2013 1245   CO2 30 08/24/2013 1539   GLUCOSE 148* 08/26/2013 1245   GLUCOSE 182* 08/24/2013 1539   GLUCOSE 143* 02/11/2013 0950   BUN 36.2* 08/26/2013 1245   BUN 28* 08/24/2013 1539   CREATININE 1.0 08/26/2013 1245   CREATININE 1.2 08/24/2013 1539   CREATININE 1.25* 03/30/2013 1558   CALCIUM 10.8* 08/26/2013 1245   CALCIUM 9.9 08/24/2013 1539   CALCIUM 6.6* 09/29/2012 1118   PROT 6.8 08/26/2013 1245   PROT 6.6 04/04/2013 0056   ALBUMIN 3.6 08/26/2013 1245   ALBUMIN 3.7 04/04/2013 0056   AST 14  08/26/2013 1245   AST 12 04/04/2013 0056   ALT 20 08/26/2013 1245   ALT 16 04/04/2013 0056   ALKPHOS 116 08/26/2013 1245   ALKPHOS 116 04/04/2013 0056   BILITOT 0.44 08/26/2013 1245   BILITOT 0.2* 04/04/2013 0056   GFRNONAA 47* 07/09/2013 0510   GFRAA 54* 07/09/2013 0510   Results for JAYLEE, FREEZE (MRN [id number]) as of 08/28/2013 00:03  Ref. Range 05/13/2013 14:14 08/26/2013 12:45  CA 27.29 Latest Range: 0-39 U/mL 19 25   RADIOGRAPHIC STUDIES: Ct Chest W Contrast  05/13/2013   CLINICAL DATA:  Metastatic right breast cancer, osseous metastases, possible lung metastases  EXAM: CT CHEST, ABDOMEN, AND PELVIS WITH CONTRAST  TECHNIQUE: Multidetector CT imaging of the chest, abdomen and pelvis was performed following the standard protocol during bolus administration of  intravenous contrast.  CONTRAST:  12mL OMNIPAQUE IOHEXOL 300 MG/ML  SOLN  COMPARISON:  CTA chest dated 03/13/2013. CT chest abdomen pelvis dated 10/08/2012.  FINDINGS: CT CHEST FINDINGS  Evaluation is constrained by respiratory motion.  Two 4-5 mm patchy/nodular opacities in the lateral right upper lobe (series 5/ image 19) and superior right lower lobe (series 5/image 21), grossly unchanged. Additional 4 mm subpleural nodule in the left upper lobe (series 5/ image 20), difficult to visualize on the priory study but likely unchanged from 10/08/2012.  No new/progressive pulmonary nodules. No pleural effusion or pneumothorax.  Visualized left thyroid is enlarged/nodular.  Mild cardiomegaly. No pericardial effusion. Atherosclerotic calcifications of the aortic arch.  No suspicious mediastinal, hilar, or axillary lymphadenopathy.  Widespread sclerotic osseous metastases in the visualized axial and appendicular skeleton, unchanged.  CT ABDOMEN AND PELVIS FINDINGS  Motion degraded images.  Liver, pancreas, and adrenal glands are within normal limits.  Spleen is within normal limits. Prior anterior splenic and perisplenic lesions have resolved.  Gallbladder is unremarkable. No intrahepatic or extrahepatic ductal dilatation.  2.3 x 1.8 cm anterior right upper pole renal cyst with suspected enhancing rim (series 4/image 13), although decreased, previously 5.1 x 4.4 cm. 1.5 cm posterior interpolar left renal cyst (series 4/ image 11). 1.3 cm anterior left lower pole renal cyst (series 4/ image 19). No hydronephrosis.  No evidence bowel obstruction. Normal appendix.  Atherosclerotic calcifications of the abdominal aorta and branch vessels.  No abdominopelvic ascites.  No suspicious abdominopelvic lymphadenopathy.  Uterus is notable for calcified uterine fibroids. Bilateral ovaries are unremarkable.  Bladder is within normal limits.  Widespread sclerotic osseous metastases in the visualized axial and appendicular skeleton,  unchanged.  IMPRESSION: CT CHEST IMPRESSION  Motion degraded images.  Scattered small pulmonary nodules measuring 4-5 mm, as described above, unchanged and indeterminate. Continued attention on follow-up is suggested.  Widespread sclerotic osseous metastases, unchanged.  CT ABDOMEN AND PELVIS IMPRESSION  Motion degraded images.  Prior anterior splenic and perisplenic lesions have resolved, of uncertain underlying etiology.  2.3 cm anterior right upper pole renal cyst with suspected enhancing rim, although decreased.  Widespread sclerotic osseous metastases, unchanged.   Electronically Signed   By: Julian Hy M.D.   On: 05/13/2013 16:36   US Breast Right  05/04/2013   *RADIOLOGY REPORT*  Clinical Data:  Right breast cancer metastatic to bone.  The patient is currently taking Letrozole.  Evaluate  for response.  DIGITAL DIAGNOSTIC RIGHT MAMMOGRAM WITH CAD AND RIGHT BREAST ULTRASOUND:  Comparison:  11/07/2012  Findings:  ACR Breast Density Category b:  There are scattered areas of fibroglandular density.  Malignant type calcifications in the right upper outer quadrant posteriorly  are again noted.  These cover an area approximately 2.3 x 2.7 x 5.0 cm in size.  The area of diffuse increased density in the upper outer quadrant of the right breast noted on prior study has decreased.  No definitive mass is identified.  Previously noted periareolar skin thickening has nearly completely resolved.  Mammographic images were processed with CAD.  On physical exam, no mass is palpated in the outer portion of the right breast.  Ultrasound is performed, showing clustered masses in the 10 o'clock position right breast, 11 cm from the right nipple likely corresponding to the area of calcifications.  This area measures 3.2 x 1.2 x 2.7 cm.  This is likely residual carcinoma.  No other definite abnormality is identified.  If further evaluation of the right breast would be helpful, breast MRI may be helpful to better delineate  the extent of residual disease.  IMPRESSION: Interval decrease in size of known carcinoma in the outer portion of the right breast.  RECOMMENDATION: Clinical treatment plan.  I have discussed the findings and recommendations with the patient. Results were also provided in writing at the conclusion of the visit.  If applicable, a reminder letter will be sent to the patient regarding the next appointment.  BI-RADS CATEGORY 6:  Known biopsy-proven malignancy - appropriate action should be taken.   Original Report Authenticated By: Ulyess Blossom, M.D.   Ct Abdomen Pelvis W Contrast  05/13/2013   CLINICAL DATA:  Metastatic right breast cancer, osseous metastases, possible lung metastases  EXAM: CT CHEST, ABDOMEN, AND PELVIS WITH CONTRAST  TECHNIQUE: Multidetector CT imaging of the chest, abdomen and pelvis was performed following the standard protocol during bolus administration of intravenous contrast.  CONTRAST:  192m OMNIPAQUE IOHEXOL 300 MG/ML  SOLN  COMPARISON:  CTA chest dated 03/13/2013. CT chest abdomen pelvis dated 10/08/2012.  FINDINGS: CT CHEST FINDINGS  Evaluation is constrained by respiratory motion.  Two 4-5 mm patchy/nodular opacities in the lateral right upper lobe (series 5/ image 19) and superior right lower lobe (series 5/image 21), grossly unchanged. Additional 4 mm subpleural nodule in the left upper lobe (series 5/ image 20), difficult to visualize on the priory study but likely unchanged from 10/08/2012.  No new/progressive pulmonary nodules. No pleural effusion or pneumothorax.  Visualized left thyroid is enlarged/nodular.  Mild cardiomegaly. No pericardial effusion. Atherosclerotic calcifications of the aortic arch.  No suspicious mediastinal, hilar, or axillary lymphadenopathy.  Widespread sclerotic osseous metastases in the visualized axial and appendicular skeleton, unchanged.  CT ABDOMEN AND PELVIS FINDINGS  Motion degraded images.  Liver, pancreas, and adrenal glands are within normal  limits.  Spleen is within normal limits. Prior anterior splenic and perisplenic lesions have resolved.  Gallbladder is unremarkable. No intrahepatic or extrahepatic ductal dilatation.  2.3 x 1.8 cm anterior right upper pole renal cyst with suspected enhancing rim (series 4/image 13), although decreased, previously 5.1 x 4.4 cm. 1.5 cm posterior interpolar left renal cyst (series 4/ image 11). 1.3 cm anterior left lower pole renal cyst (series 4/ image 19). No hydronephrosis.  No evidence bowel obstruction. Normal appendix.  Atherosclerotic calcifications of the abdominal aorta and branch vessels.  No abdominopelvic ascites.  No suspicious abdominopelvic lymphadenopathy.  Uterus is notable for calcified uterine fibroids. Bilateral ovaries are unremarkable.  Bladder is within normal limits.  Widespread sclerotic osseous metastases in the visualized axial and appendicular skeleton, unchanged.  IMPRESSION: CT CHEST IMPRESSION  Motion degraded images.  Scattered small pulmonary nodules measuring 4-5 mm, as described above,  unchanged and indeterminate. Continued attention on follow-up is suggested.  Widespread sclerotic osseous metastases, unchanged.  CT ABDOMEN AND PELVIS IMPRESSION  Motion degraded images.  Prior anterior splenic and perisplenic lesions have resolved, of uncertain underlying etiology.  2.3 cm anterior right upper pole renal cyst with suspected enhancing rim, although decreased.  Widespread sclerotic osseous metastases, unchanged.   Electronically Signed   By: Julian Hy M.D.   On: 05/13/2013 16:36   Mm Digital Diagnostic Unilat R  05/04/2013   *RADIOLOGY REPORT*  Clinical Data:  Right breast cancer metastatic to bone.  The patient is currently taking Letrozole.  Evaluate  for response.  DIGITAL DIAGNOSTIC RIGHT MAMMOGRAM WITH CAD AND RIGHT BREAST ULTRASOUND:  Comparison:  11/07/2012  Findings:  ACR Breast Density Category b:  There are scattered areas of fibroglandular density.  Malignant type  calcifications in the right upper outer quadrant posteriorly are again noted.  These cover an area approximately 2.3 x 2.7 x 5.0 cm in size.  The area of diffuse increased density in the upper outer quadrant of the right breast noted on prior study has decreased.  No definitive mass is identified.  Previously noted periareolar skin thickening has nearly completely resolved.  Mammographic images were processed with CAD.  On physical exam, no mass is palpated in the outer portion of the right breast.  Ultrasound is performed, showing clustered masses in the 10 o'clock position right breast, 11 cm from the right nipple likely corresponding to the area of calcifications.  This area measures 3.2 x 1.2 x 2.7 cm.  This is likely residual carcinoma.  No other definite abnormality is identified.  If further evaluation of the right breast would be helpful, breast MRI may be helpful to better delineate the extent of residual disease.  IMPRESSION: Interval decrease in size of known carcinoma in the outer portion of the right breast.  RECOMMENDATION: Clinical treatment plan.  I have discussed the findings and recommendations with the patient. Results were also provided in writing at the conclusion of the visit.  If applicable, a reminder letter will be sent to the patient regarding the next appointment.  BI-RADS CATEGORY 6:  Known biopsy-proven malignancy - appropriate action should be taken.   Original Report Authenticated By: Ulyess Blossom, M.D.   PET CT 08/10/2013 Additional history indicates that the patient has been evaluated for  a right vocal cord lesion and the patient has a mildly enlarged  right-sided level 3 cervical lymph node, as seen on a CT scan of the  neck dated 06/24/2013. The PET images show no convincing evidence  for FDG uptake above background soft tissue levels in the vocal  cords. However, the right level 3 lymph node described on the  previous neck CT does appear to be hypermetabolic above  background  soft tissue levels. For this reason, metastatic disease within this  lymph node is a concern.  Electronically Signed  By: Misty Stanley M.D.  On: 08/12/2013 11:53       Study Result    CLINICAL DATA: Subsequent treatment strategy for breast cancer.  EXAM:  NUCLEAR MEDICINE PET SKULL BASE TO THIGH  FASTING BLOOD GLUCOSE: Value: $RemoveBeforeD'136mg'[id number]$ /dl  TECHNIQUE:  18.4 mCi F-18 FDG was injected intravenously. CT data was obtained  and used for attenuation correction and anatomic localization only.  (This was not acquired as a diagnostic CT examination.) Additional  exam technical data entered on technologist worksheet.  COMPARISON: Chest CT from 05/13/2013. [name]  FINDINGS:  NECK  No hypermetabolic  lymph nodes in the neck. By report, the patient  has invasive vocal cord tumor. There is no evidence for  hypermetabolism associated with the vocal cords on today's exam.  CHEST  No hypermetabolic mediastinal or hilar nodes. No suspicious  pulmonary nodules on the CT scan.  Heart is enlarged. There is no pericardial or pleural effusion.  Coronary artery calcification is noted.  The patient had bilateral pulmonary nodules on the previous study.  The 2 larger nodules within the right lung and are visualized today.  These are more difficult to characterize given the free breathing  used for CT image acquisition as part of a PET exam, but they do not  appear significantly changed in the interval.  ABDOMEN/PELVIS  No abnormal hypermetabolic activity within the liver, pancreas,  adrenal glands, or spleen. No hypermetabolic lymph nodes in the  abdomen or pelvis.  The liver is enlarged and shows diffuse steatosis. 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 Cysts identified in both  kidneys, including the irregular cyst in the right kidney that  showed rim enhancement on the previous study. This lesion was  decreased on the prior study and appears smaller still on today's  exam.   SKELETON  There is diffuse sclerotic change within the skeleton although no  associated hypermetabolism to suggest active metastatic bony  involvement.  IMPRESSION:  No evidence for hypermetabolic soft tissue in the neck, chest,  abdomen, or pelvis.  Stable appearance of diffuse bony sclerotic skeletal lesions without  associated hyper metabolism. This is compatible with sequelae of  treated bony metastatic disease.   07/07/2013 Coronary angiography:  Coronary dominance: right  Left mainstem: Normal  Left anterior descending (LAD): Large vessel. There is 40% disease prior to the first diagonal with focal 80-85% disease immediately after the first diagonal. The first diagonal is a moderate size vessel that bifurcates. It has no significant disease.  Left circumflex (LCx): The LCx gives rise to a single large OM. There is 20% disease in the proximal LCx.  Right coronary artery (RCA): The RCA is diffusely and severely diseased in the mid vessel. It is occluded distally with left to right collaterals.  Left ventriculography: Left ventricular systolic function is abnormal, LVEF is estimated at 30-35%, There is akinesis of the basal to mid inferior wall and severe hypokinesis of the anterior wall, there is no significant mitral regurgitation  Final Conclusions:  1. Severe 2 vessel obstructive CAD. The RCA is chronically occluded distally with collaterals. There is a moderate to severe stenosis in the LAD.  2. Severe LV dysfunction with high filling pressures.  Recommendations: Aggressive medical management of CAD and CHF. Needs IV diuresis. Will increase nitrates and add low dose bisoprolol. Add ACEi if renal function stable. Could consider stenting of the LAD if the patient remains symptomatic despite optimal medical therapy  ASSESSMENT: Metastatic breast cancer on palliative letrozole  PLAN:  1. Grade 2 dystolic dysfunction: she is euvolumic today on exam. She is on Lasix, nitrogylerin prn,  losartan  2. Diabetes mellitus, type II. Diet controlled.  3. COPD: On albuterol prn. She remains on home oxygen. 4. Chronic renal insufficiency.  5. Metastatic breast cancer (ER pos/PR pos/ HER2 neg) with spread to bone.  -Clinically, she is having response with stable breast mass.  CT of Chest does however show a new lung nodule.  She is tolerating letrozole well without bone/muscle pain, hot flash/night sweat. She is taking Calcium to decrease risk of osteoporosis.  Recent PET  showed no evidence for hypermetabolic soft tissue in the chest, abdomen, or pelvis and stable appearance of diffuse bony sclerotic skeletal lesions without  associated hyper metabolism. This is compatible with sequelae of  treated bony metastatic disease. She continues to decline zometa or denosumab for her bone disease. Handouts were provided about metastatic disease. 6. Anemia of chronic disease: Past work up for reversible causes of anemia were negative.  7. Right vocal cord lesion with right level 3 lymph node described on the  previous neck CT  hypermetabolic above background soft tissue levels concerning for metastatic disease. Evaluation by radiation oncology, Dr. Sondra Come is scheduled for 01/08.   8. Follow up: In about 2-3 months with CBC, CMP.  We will consider staging based on clinical symptoms or in 3-6 months.   All questions were answered. The patient knows to call the clinic with any problems, questions or concerns. We can certainly see the patient much sooner if necessary.  I spent 25 minutes counseling the patient face to face. The total time spent in the appointment was 40 minutes.    Hodaya Curto, MD 08/27/2013 8:48 PM

## 2013-08-28 ENCOUNTER — Ambulatory Visit (HOSPITAL_COMMUNITY): Payer: Medicare HMO

## 2013-08-31 ENCOUNTER — Ambulatory Visit (HOSPITAL_COMMUNITY): Payer: Medicare HMO

## 2013-09-02 ENCOUNTER — Ambulatory Visit: Payer: Medicare HMO | Admitting: Radiation Oncology

## 2013-09-02 ENCOUNTER — Ambulatory Visit (HOSPITAL_COMMUNITY): Payer: Medicare HMO

## 2013-09-02 ENCOUNTER — Ambulatory Visit: Payer: Medicare HMO

## 2013-09-03 ENCOUNTER — Ambulatory Visit: Payer: Medicare HMO

## 2013-09-03 ENCOUNTER — Telehealth: Payer: Self-pay | Admitting: Oncology

## 2013-09-03 ENCOUNTER — Ambulatory Visit
Admission: RE | Admit: 2013-09-03 | Discharge: 2013-09-03 | Disposition: A | Discharge status: Home / Self Care | Payer: Medicare HMO | Source: Ambulatory Visit | Attending: Radiation Oncology | Admitting: Radiation Oncology

## 2013-09-03 NOTE — Telephone Encounter (Signed)
Left a message for Anita [name] asking if she was coming for her consult appointment with Dr. Sondra Come today.  Asked that she call [phone].

## 2013-09-04 ENCOUNTER — Ambulatory Visit (HOSPITAL_COMMUNITY): Payer: Medicare HMO

## 2013-09-07 ENCOUNTER — Ambulatory Visit (HOSPITAL_COMMUNITY): Payer: Medicare HMO

## 2013-09-09 ENCOUNTER — Ambulatory Visit (HOSPITAL_COMMUNITY): Payer: Medicare HMO

## 2013-09-11 ENCOUNTER — Ambulatory Visit (HOSPITAL_COMMUNITY): Payer: Medicare HMO

## 2013-09-14 ENCOUNTER — Ambulatory Visit (HOSPITAL_COMMUNITY): Payer: Medicare HMO

## 2013-09-14 ENCOUNTER — Other Ambulatory Visit: Payer: Self-pay | Admitting: Internal Medicine

## 2013-09-16 ENCOUNTER — Telehealth: Payer: Self-pay | Admitting: *Deleted

## 2013-09-16 ENCOUNTER — Encounter: Payer: Self-pay | Admitting: *Deleted

## 2013-09-16 ENCOUNTER — Ambulatory Visit (HOSPITAL_COMMUNITY): Payer: Medicare HMO

## 2013-09-16 NOTE — Telephone Encounter (Signed)
Left message to call to reschedule missed appointment with Dr. Sondra Come on 09/03/13

## 2013-09-18 ENCOUNTER — Ambulatory Visit (HOSPITAL_COMMUNITY): Payer: Medicare HMO

## 2013-10-02 ENCOUNTER — Encounter: Payer: Self-pay | Admitting: Cardiology

## 2013-10-02 ENCOUNTER — Other Ambulatory Visit: Payer: Medicare HMO

## 2013-10-02 ENCOUNTER — Ambulatory Visit (INDEPENDENT_AMBULATORY_CARE_PROVIDER_SITE_OTHER): Payer: Medicare HMO | Admitting: Cardiology

## 2013-10-02 VITALS — BP 134/80 | HR 82 | Ht 62.0 in | Wt 234.0 lb

## 2013-10-02 DIAGNOSIS — Z79899 Other long term (current) drug therapy: Secondary | ICD-10-CM

## 2013-10-02 DIAGNOSIS — R8290 Unspecified abnormal findings in urine: Secondary | ICD-10-CM

## 2013-10-02 DIAGNOSIS — R82998 Other abnormal findings in urine: Secondary | ICD-10-CM

## 2013-10-02 DIAGNOSIS — E876 Hypokalemia: Secondary | ICD-10-CM

## 2013-10-02 MED ORDER — ISOSORBIDE MONONITRATE ER 60 MG PO TB24: 60.0000 mg | ORAL_TABLET | Freq: Every day | ORAL | Status: DC

## 2013-10-02 NOTE — Patient Instructions (Addendum)
Will obtain labs today and call you with the results (bmet/urinalysis)  Your physician recommends that you continue on your current medications as directed. Please refer to the Current Medication list given to you today. (rx sent for the Imdur 60 mg daily)  Your physician wants you to follow-up in: 6 months ov You will receive a reminder letter in the mail two months in advance. If you don't receive a letter, please call our office to schedule the follow-up appointment.

## 2013-10-02 NOTE — Progress Notes (Signed)
HPI  The patient presents for follow up after a hospitalization for treatment of dyspnea and anasarca. She had a non-Q-wave MI at that time which was felt to be secondary. She's being managed for metastatic breast cancer.  She did have an outpatient low risk stress nuclear study with an old inferior wall scar of small size involving the apical inferior, mid-inferior and basal inferior segments. No significant ischemia and an LV Ejection Fraction of 49%.   Her ejection fraction on echo in the hospital was 55%. She had evidence of diastolic dysfunction. She was hospitalized in November and had a cath with an occluded right coronary artery and moderate LAD stenosis. She was managed medically.  Since last being in the hospital she's had no acute shortness of breath. She does have metastatic breast cancer being managed for this. She's considering radiation on her vocal cord lesion. She denies any acute shortness of breath, PND or orthopnea. She's had no palpitations, presyncope or syncope. She's had no PND or orthopnea. She does have oxygen and is chronically dyspneic with exertion but this is not changed. She does have some mild ankle edema. However, her weights have been quite stable.   Allergies  Allergen Reactions  . Omnipaque [Iohexol]     Pt claims she developed hives after given contrast  . Benzene Rash    Current Outpatient Prescriptions  Medication Sig Dispense Refill  . albuterol (PROVENTIL HFA;VENTOLIN HFA) 108 (90 BASE) MCG/ACT inhaler Inhale 1 puff into the lungs 2 (two) times daily as needed for wheezing or shortness of breath.      [name] aspirin EC 81 MG EC tablet Take 1 tablet (81 mg total) by mouth daily.  30 tablet  3  . atorvastatin (LIPITOR) 40 MG tablet Take 1 tablet (40 mg total) by mouth daily at 6 PM.  30 tablet  5  . Calcium Citrate (CALCITRATE PO) Take 1 tablet by mouth daily.       . furosemide (LASIX) 40 MG tablet Take 1 tablet (40 mg total) by mouth 2 (two) times daily.   180 tablet  3  . isosorbide mononitrate (IMDUR) 30 MG 24 hr tablet Take 1 tablet (30 mg total) by mouth 2 (two) times daily.  180 tablet  3  . letrozole (FEMARA) 2.5 MG tablet TAKE ONE TABLET BY MOUTH ONCE DAILY  30 tablet  0  . losartan (COZAAR) 25 MG tablet Take 1 tablet (25 mg total) by mouth daily.  90 tablet  3  . nitroGLYCERIN (NITROSTAT) 0.4 MG SL tablet Place 1 tablet (0.4 mg total) under the tongue every 5 (five) minutes as needed for chest pain.  30 tablet  0  . NON FORMULARY Place 3 L into the nose as needed. oxygen      . Potassium Chloride (KLOR-CON PO) Take 20 mEq by mouth 2 (two) times daily.       [name] Umeclidinium-Vilanterol (ANORO ELLIPTA) 62.5-25 MCG/INH AEPB Inhale 1 puff into the lungs daily.  60 each  5   No current facility-administered medications for this visit.    Past Medical History  Diagnosis Date  . Fibroid   . Morbid obesity   . 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 COPD (chronic obstructive pulmonary disease)   . Chronic diastolic CHF (congestive heart failure)   . NSTEMI (non-ST elevated myocardial infarction)     a. NSTEMI 08/2012: med rx; nuc 10/2012: low risk (old scar, no  significant ischemia, EF 49% with inf wall HK). b. NSTEMI 02/2013: managed med, pt declined cath at that time.  . Anemia     a. Adm 09/2012 with melena, Hgb 5.8 -> transfused. EGD/colonoscopy unrevealing.  . Breast cancer     a. Mets to bone. ER 100%/ PR 0%/Her2 neu negative.  [name] Ulcers of both lower legs   . Tobacco abuse   . Hyperlipidemia 02/11/2013  . Diabetes mellitus without complication   . Chronic respiratory failure     a. On O2 qhs. also portable O2.  . Chronic renal insufficiency   . Lesion of vocal cord     a. CT 05/2013 concerning for tumor.    Past Surgical History  Procedure Laterality Date  . Colonoscopy, esophagogastroduodenoscopy (egd) and esophageal dilation N/A 10/13/2012    Procedure: colonoscopy and egd;  Surgeon: Wonda Horner, MD;  Location: [place];  Service: Endoscopy;  Laterality: N/A;    ROS:  As stated in the HPI and negative for all other systems.  PHYSICAL EXAM BP 134/80  Pulse 82  Ht $R'5\' 2"'vF$  (1.575 m)  Wt 234 lb (106.142 kg)  BMI 42.79 kg/m2 GENERAL:  Well appearing HEENT:  Pupils equal round and reactive, fundi not visualized, oral mucosa unremarkable NECK:  No jugular venous distention, waveform within normal limits, carotid upstroke brisk and symmetric, no bruits, no thyromegaly LYMPHATICS:  No cervical, inguinal adenopathy LUNGS:  Clear to auscultation bilaterally BACK:  No CVA tenderness CHEST:  Unremarkable HEART:  PMI not displaced or sustained,S1 and S2 within normal limits, no S3, no S4, no clicks, no rubs, no murmurs ABD:  Flat, positive bowel sounds normal in frequency in pitch, no bruits, no rebound, no guarding, no midline pulsatile mass, no hepatomegaly, no splenomegaly, morbidly obese EXT:  2 plus pulses throughout, mild bilateraledema, no cyanosis no clubbing   ASSESSMENT AND PLAN  CAD:  This is going to be managed medically and she's having no ongoing angina. I did instruct her to take her Imdur 60 mg once daily. She was taking it twice a day 30 mg.  CHRNOIC DIASTOLIC HF:  Her ejection fraction was low on the catheterization recently though her EF has been well preserved on echo. Cannot manage this conservatively. She seems to be euvolemic. She will continue the meds as listed. I will check a basic metabolic profile today.  UTI:  She reports a foul smelling urine and I will check a urinalysis.  Cath and hospital records reviewed.

## 2013-10-05 LAB — BASIC METABOLIC PANEL
BUN: 27 mg/dL — AB (ref 6–23)
CHLORIDE: 102 meq/L (ref 96–112)
CO2: 27 meq/L (ref 19–32)
CREATININE: 1 mg/dL (ref 0.4–1.2)
Calcium: 10.1 mg/dL (ref 8.4–10.5)
GFR: 62.48 mL/min (ref >60.00)
Glucose, Bld: 133 mg/dL — ABNORMAL HIGH (ref 70–99)
Potassium: 3.4 mEq/L — ABNORMAL LOW (ref 3.5–5.1)
Sodium: 138 mEq/L (ref 135–145)

## 2013-10-07 ENCOUNTER — Telehealth: Payer: Self-pay | Admitting: *Deleted

## 2013-10-07 NOTE — Telephone Encounter (Signed)
Patient was previously referred for ENT appt with Dr. Ernesto Rutherford for incus absence.  Patient has Medicaid & office calling to request NPI #  to authorize appt.  Had appt with Dr. Ernesto Rutherford on 06/10/13 & 06/29/13.  NPI # given. Office will fax notes to our office for Dr. Lacinda Axon to review.          Nolene Ebbs, RN

## 2013-10-09 ENCOUNTER — Telehealth: Payer: Self-pay | Admitting: Cardiology

## 2013-10-09 NOTE — Telephone Encounter (Signed)
New message  ° ° °Patient calling for test results.   °

## 2013-10-09 NOTE — Telephone Encounter (Signed)
Left another message for pt to call back.

## 2013-10-09 NOTE — Telephone Encounter (Signed)
Left message to call back to needs to take extra 20 MEQ K+ daily since her level continues to be low.

## 2013-10-10 ENCOUNTER — Other Ambulatory Visit: Payer: Self-pay | Admitting: Family Medicine

## 2013-10-10 ENCOUNTER — Other Ambulatory Visit: Payer: Self-pay | Admitting: Internal Medicine

## 2013-10-12 ENCOUNTER — Other Ambulatory Visit: Payer: Self-pay | Admitting: *Deleted

## 2013-10-12 ENCOUNTER — Other Ambulatory Visit: Payer: Self-pay | Admitting: Medical Oncology

## 2013-10-12 DIAGNOSIS — I5032 Chronic diastolic (congestive) heart failure: Secondary | ICD-10-CM

## 2013-10-12 MED ORDER — FUROSEMIDE 40 MG PO TABS: 40.0000 mg | ORAL_TABLET | Freq: Two times a day (BID) | ORAL | Status: DC

## 2013-10-12 MED ORDER — LETROZOLE 2.5 MG PO TABS: 2.5000 mg | ORAL_TABLET | Freq: Every day | ORAL | Status: DC

## 2013-10-12 MED ORDER — ISOSORBIDE MONONITRATE ER 60 MG PO TB24: 60.0000 mg | ORAL_TABLET | Freq: Every day | ORAL | Status: DC

## 2013-10-12 MED ORDER — POTASSIUM CHLORIDE CRYS ER 20 MEQ PO TBCR: EXTENDED_RELEASE_TABLET | ORAL | Status: DC

## 2013-10-12 MED ORDER — LOSARTAN POTASSIUM 25 MG PO TABS: 25.0000 mg | ORAL_TABLET | Freq: Every day | ORAL | Status: DC

## 2013-10-12 NOTE — Telephone Encounter (Signed)
Pt advised to take extra 20 MEQ K+

## 2013-10-12 NOTE — Telephone Encounter (Signed)
Follow up ° ° ° ° °Want lab results °

## 2013-10-22 NOTE — Progress Notes (Signed)
Head and Neck Cancer Location of Tumor / Histology: Right vocal cord lesion with right level 3 lymph node  The lesion was found by Anita [name] scope done by Dr. Ernesto [name] and also CT Scan done on 06/25/13.  Biopsies have not been done due to patient's respiratory and cardiovascular status.  Nutrition Status:  Weight changes: has lost 7 lbs since 11/14  Swallowing status: no  Plans, if any, for PEG tube: unknown  Tobacco/Marijuana/Snuff/ETOH use: smoked 2 packs per day for 40 years.  Quit in January 2014.    Past/Anticipated interventions by otolaryngology, if any: sees Dr. 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 ER 100%/ PR 0%/Her2 neu negative  Referrals yet, to any of the following?  Social Work? No   Dentistry? no  Swallowing therapy? no  Nutrition? no  Med/Onc? yes  PEG placement? no  SAFETY ISSUES:  Prior radiation? no  Pacemaker/ICD? no  Possible current pregnancy? no  Is the patient on methotrexate? no  Current Complaints / other details:  Does not have trouble swallowing or a sore throat.  She is concerned about her voice which is hoarse.

## 2013-10-27 ENCOUNTER — Telehealth: Payer: Self-pay | Admitting: *Deleted

## 2013-10-27 NOTE — Telephone Encounter (Signed)
LVM for patient reminding her of her 3/4 NE @ 10:30 and 11:00 appt with Dr. Sondra Come.  [name], RN, BSN, [facility] Head & Neck Oncology Navigator [phone]

## 2013-10-28 ENCOUNTER — Ambulatory Visit
Admission: RE | Admit: 2013-10-28 | Discharge: 2013-10-28 | Disposition: A | Discharge status: Home / Self Care | Payer: Medicare HMO | Source: Ambulatory Visit | Attending: Radiation Oncology | Admitting: Radiation Oncology

## 2013-10-28 ENCOUNTER — Encounter: Payer: Self-pay | Admitting: *Deleted

## 2013-10-28 ENCOUNTER — Encounter: Payer: Self-pay | Admitting: Radiation Oncology

## 2013-10-28 VITALS — BP 125/78 | HR 102 | Temp 97.5°F | Ht 62.0 in | Wt 232.6 lb

## 2013-10-28 DIAGNOSIS — E119 Type 2 diabetes mellitus without complications: Secondary | ICD-10-CM | POA: Insufficient documentation

## 2013-10-28 DIAGNOSIS — Z79899 Other long term (current) drug therapy: Secondary | ICD-10-CM | POA: Diagnosis not present

## 2013-10-28 DIAGNOSIS — Z8249 Family history of ischemic heart disease and other diseases of the circulatory system: Secondary | ICD-10-CM | POA: Insufficient documentation

## 2013-10-28 DIAGNOSIS — F17200 Nicotine dependence, unspecified, uncomplicated: Secondary | ICD-10-CM | POA: Diagnosis not present

## 2013-10-28 DIAGNOSIS — I252 Old myocardial infarction: Secondary | ICD-10-CM | POA: Insufficient documentation

## 2013-10-28 DIAGNOSIS — J383 Other diseases of vocal cords: Secondary | ICD-10-CM

## 2013-10-28 DIAGNOSIS — J449 Chronic obstructive pulmonary disease, unspecified: Secondary | ICD-10-CM | POA: Diagnosis not present

## 2013-10-28 DIAGNOSIS — Z833 Family history of diabetes mellitus: Secondary | ICD-10-CM | POA: Insufficient documentation

## 2013-10-28 DIAGNOSIS — I5032 Chronic diastolic (congestive) heart failure: Secondary | ICD-10-CM | POA: Diagnosis not present

## 2013-10-28 DIAGNOSIS — D259 Leiomyoma of uterus, unspecified: Secondary | ICD-10-CM | POA: Diagnosis not present

## 2013-10-28 DIAGNOSIS — C329 Malignant neoplasm of larynx, unspecified: Secondary | ICD-10-CM | POA: Diagnosis present

## 2013-10-28 DIAGNOSIS — D069 Carcinoma in situ of cervix, unspecified: Secondary | ICD-10-CM | POA: Diagnosis not present

## 2013-10-28 DIAGNOSIS — I509 Heart failure, unspecified: Secondary | ICD-10-CM | POA: Diagnosis not present

## 2013-10-28 DIAGNOSIS — E785 Hyperlipidemia, unspecified: Secondary | ICD-10-CM | POA: Insufficient documentation

## 2013-10-28 DIAGNOSIS — J4489 Other specified chronic obstructive pulmonary disease: Secondary | ICD-10-CM | POA: Insufficient documentation

## 2013-10-28 DIAGNOSIS — J9610 Chronic respiratory failure, unspecified whether with hypoxia or hypercapnia: Secondary | ICD-10-CM | POA: Diagnosis not present

## 2013-10-28 MED ORDER — LARYNGOSCOPY SOLUTION RAD-ONC
15.0000 mL | Freq: Once | TOPICAL | Status: AC
  Administered 2013-10-28: 15 mL via TOPICAL
  Filled 2013-10-28: qty 15

## 2013-10-28 NOTE — Progress Notes (Signed)
Please see the Nurse Progress Note in the MD Initial Consult Encounter for this patient. 

## 2013-10-28 NOTE — Progress Notes (Signed)
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 female who is seen out courtesy of Dr. Minna Merritts for an opinion concerning radiation therapy for what appears to be stage III laryngeal carcinoma. The patient presented late last year with  hoarseness. She was seen by Dr. Ernesto Rutherford and on examination was noted to have a lesion along the right anterior true vocal cord. Patient proceeded to undergo workup with a CT scan of the neck as well as PET scan. The patient's CT scan showed a 1.5 cm enlarged right level III lymph node. Patient's PET scan did show activity in this lymph node but no obvious activity in the laryngeal area likely related to the small size of the primart lesion. Due to the patient's medical issues she is not a candidate for biopsy. 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    PAST SURGICAL HISTORY: Past Surgical History  Procedure Laterality Date  . Colonoscopy, esophagogastroduodenoscopy (egd) and esophageal dilation N/A 10/13/2012   Procedure: colonoscopy and egd;  Surgeon: Wonda Horner, MD;  Location: [place];  Service: Endoscopy;  Laterality: N/A;    FAMILY HISTORY: family history includes Cancer in her mother; Diabetes in her father; Heart disease in her father; Hypertension in her mother.  SOCIAL HISTORY:  reports that she quit smoking about 13 months ago. Her smoking use included Cigarettes. She has a 80 pack-year smoking history. She has never used smokeless tobacco. She reports that she does not drink alcohol or use illicit drugs.  ALLERGIES: Omnipaque and Benzene  MEDICATIONS:  Current Outpatient Prescriptions  Medication Sig Dispense Refill  . albuterol (PROVENTIL HFA;VENTOLIN HFA) 108 (90 BASE) MCG/ACT inhaler Inhale 1 puff into the lungs 2 (two) times daily as needed for wheezing or shortness of breath.      [name] aspirin EC 81 MG EC tablet Take 1 tablet (81 mg total) by mouth daily.  30 tablet  3  . atorvastatin (LIPITOR) 40 MG tablet Take 1 tablet (40 mg total) by mouth daily at 6 PM.  30 tablet  5  . Calcium Citrate (CALCITRATE PO) Take 1 tablet by mouth daily.       . furosemide (LASIX) 40 MG tablet Take 1 tablet (40 mg total) by mouth 2 (two) times daily.  180 tablet  1  . isosorbide mononitrate (IMDUR) 60 MG 24 hr tablet Take 1 tablet (60 mg total) by mouth daily.  90 tablet  1  . letrozole (FEMARA) 2.5 MG tablet Take 1 tablet (2.5 mg total) by mouth daily.  90 tablet  3  . losartan (COZAAR) 25 MG tablet Take 1 tablet (25 mg total) by mouth daily.  90 tablet  1  .  nitroGLYCERIN (NITROSTAT) 0.4 MG SL tablet Place 1 tablet (0.4 mg total) under the tongue every 5 (five) minutes as needed for chest pain.  30 tablet  0  . NON FORMULARY Place 3 L into the nose as needed. oxygen      . potassium chloride SA (KLOR-CON M20) 20 MEQ tablet 2 tablets (40 mEq) AM and 1 tablet ( 20 mEq) PM  270 tablet  3  . Umeclidinium-Vilanterol (ANORO ELLIPTA) 62.5-25 MCG/INH AEPB Inhale 1 puff into the lungs daily.  60 each  5    No current facility-administered medications for this encounter.    REVIEW OF SYSTEMS:  A 15 point review of systems is documented in the electronic medical record. This was obtained by the nursing staff. However, I reviewed this with the patient to discuss relevant findings and make appropriate changes.  Hoarseness as above. Patient denies any aspiration problems or difficulty with swallowing or pain with swallowing. She denies any ear pain. She has dyspnea with exertion but is able to ambulate with a cane.  she does have some pain in her lower back area and will be seeing her medical oncologist for this issue. Patient does have a history of metastatic breast cancer and is on Femara for this issue.   PHYSICAL EXAM:  height is 5\' 2"  (1.575 m) and weight is 232 lb 9.6 oz (105.507 kg). Her temperature is 97.5 F (36.4 C). Her blood pressure is 125/78 and her pulse is 102. Her oxygen saturation is 95%.  this is a very pleasant 68 year old female in no acute distress. She does have some mild hoarseness.  Examination of the pupils reveals them to be equal round reactive to light. The extraocular eye movements are intact. The tongue is midline. There is no secondary infection noted the oral cavity or posterior pharynx. Patient is edentulous. Examination of the neck supraclavicular and axillary areas reveals no palpable adenopathy. The lungs are clear to auscultation. The heart has a regular rhythm and rate.  The abdomen is soft and nontender with normal bowel sounds. On neurological examination motor strength is 5 out of 5 in the proximal and distal muscle groups of the upper and lower extremities.       The patient proceeded to undergo fiberoptic exam after a topical anesthetic and decongestant was placed in the nasal cavities.  Examination of the larynx revealed a white nodule along the anterior portion of the right vocal cord. No other abnormalities were noted. The vocal cords appeared to move well on  examination.   ECOG = 1  1 - Symptomatic but completely ambulatory (Restricted in physically strenuous activity but ambulatory and able to carry out work of a light or sedentary nature. For example, light housework, office work)  LABORATORY DATA:  Lab Results  Component Value Date   WBC 7.7 08/26/2013   HGB 11.0* 08/26/2013   HCT 33.2* 08/26/2013   MCV 75.8* 08/26/2013   PLT 288 08/26/2013   Lab Results  Component Value Date   NA 138 10/02/2013   K 3.4* 10/02/2013   CL 102 10/02/2013   CO2 27 10/02/2013   Lab Results  Component Value Date   ALT 20 08/26/2013   AST 14 08/26/2013   ALKPHOS 116 08/26/2013   BILITOT 0.44 08/26/2013     RADIOGRAPHY: No results found.    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 She is not a candidate for biopsy in light of her medical issues. I discussed with patient that we do not  have a tissue diagnosis but assume this is likely a squamous cell carcinoma, the most common type of malignancy presenting in the vocal cord area. Patient does have a prior history of significant tobacco use (cigarette smoking) which likely contributed to her developing this malignancy. I discussed potential treatment without tissue diagnosis. The patient understands the implications of this controversy. She also understands that with tumor progression she will likely develop respiratory distress which would be a emergency, potentially fatal situation  given the patient's prior medical issues.  After careful consideration the patient would like to proceed with radiation therapy without tissue diagnosis.  PLAN: Simulation and planning in the near future. Anticipate between 6.5 - 7  weeks of radiation therapy.  I spent 60 minutes minutes face to face with the patient and more than 50% of that time was spent in counseling and/or coordination of care.   ------------------------------------------------  -----------------------------------  Blair Promise, PhD, MD

## 2013-10-29 ENCOUNTER — Telehealth: Payer: Self-pay | Admitting: *Deleted

## 2013-10-29 NOTE — Telephone Encounter (Signed)
With the background of previously missed initial consult appt with Dr. Sondra Come, called patient with reminder of her 10:30 NE and 11:00 initial consult with Dr. Sondra Come.  LVM.  Patient later returned call (LVM) stating she is aware of appts and plans to keep them.  [name], RN, BSN, [facility] Head & Neck Oncology Navigator [phone]

## 2013-10-29 NOTE — Telephone Encounter (Signed)
Called patient to arrange date for [facility] s/p yesterday's consult with Dr. Sondra Come.  Patient stated that she is not ready to agree to RT, expressed concerns about SEs, particularly difficulty with swallowing, increased hoarseness.  I explained that these are temporary and will improve after tmts are completed.  She stated she wants to discuss options with Dr. Juliann Mule when she meets with him tomorrow.  "There must be a pill".  I asked if I could join her tomorrow during this appt and she stated that would be OK.  SIM has a hold on 2 slots for next week should patient agree to moving forward with RT.  [name], RN, BSN, [facility] Head & Neck Oncology Navigator [phone]

## 2013-10-29 NOTE — Progress Notes (Signed)
Met with patient during initial consult with Dr.  Sondra Come.  Re-iIntroduced myself as her Navigator, explained my role as a member of the Care Team, provided contact information, encouraged her to contact me with questions/concerns as treatments/procedures begin.  She indicated understanding.    Showed her an example of the mask that would be fitted during her SIM, provided her a tour of SIM and [facility] areas, explained treatments and arrival procedures.  She verbalized understanding.  Initiating navigation as L1 patient (new) with this encounter.  [name], RN, BSN, [facility] Head & Neck Oncology Navigator [phone]

## 2013-10-30 ENCOUNTER — Ambulatory Visit (HOSPITAL_BASED_OUTPATIENT_CLINIC_OR_DEPARTMENT_OTHER): Payer: Medicare HMO | Admitting: Internal Medicine

## 2013-10-30 ENCOUNTER — Telehealth: Payer: Self-pay | Admitting: Internal Medicine

## 2013-10-30 ENCOUNTER — Encounter: Payer: Self-pay | Admitting: *Deleted

## 2013-10-30 ENCOUNTER — Other Ambulatory Visit (HOSPITAL_BASED_OUTPATIENT_CLINIC_OR_DEPARTMENT_OTHER): Payer: Medicare HMO

## 2013-10-30 VITALS — BP 108/65 | HR 91 | Temp 98.5°F | Resp 20 | Ht 62.0 in | Wt 232.3 lb

## 2013-10-30 DIAGNOSIS — C50919 Malignant neoplasm of unspecified site of unspecified female breast: Secondary | ICD-10-CM

## 2013-10-30 DIAGNOSIS — J449 Chronic obstructive pulmonary disease, unspecified: Secondary | ICD-10-CM

## 2013-10-30 DIAGNOSIS — N189 Chronic kidney disease, unspecified: Secondary | ICD-10-CM

## 2013-10-30 DIAGNOSIS — C7800 Secondary malignant neoplasm of unspecified lung: Secondary | ICD-10-CM

## 2013-10-30 DIAGNOSIS — C7951 Secondary malignant neoplasm of bone: Secondary | ICD-10-CM

## 2013-10-30 DIAGNOSIS — E119 Type 2 diabetes mellitus without complications: Secondary | ICD-10-CM

## 2013-10-30 DIAGNOSIS — I519 Heart disease, unspecified: Secondary | ICD-10-CM

## 2013-10-30 DIAGNOSIS — D638 Anemia in other chronic diseases classified elsewhere: Secondary | ICD-10-CM

## 2013-10-30 DIAGNOSIS — C50911 Malignant neoplasm of unspecified site of right female breast: Secondary | ICD-10-CM

## 2013-10-30 DIAGNOSIS — C7952 Secondary malignant neoplasm of bone marrow: Secondary | ICD-10-CM

## 2013-10-30 DIAGNOSIS — J383 Other diseases of vocal cords: Secondary | ICD-10-CM

## 2013-10-30 LAB — CBC WITH DIFFERENTIAL/PLATELET
BASO%: 0.4 % (ref 0.0–2.0)
Basophils Absolute: 0 10*3/uL (ref 0.0–0.1)
EOS ABS: 0.1 10*3/uL (ref 0.0–0.5)
EOS%: 2 % (ref 0.0–7.0)
HCT: 32.7 % — ABNORMAL LOW (ref 34.8–46.6)
HGB: 10.6 g/dL — ABNORMAL LOW (ref 11.6–15.9)
LYMPH%: 16.5 % (ref 14.0–49.7)
MCH: 24.8 pg — ABNORMAL LOW (ref 25.1–34.0)
MCHC: 32.5 g/dL (ref 31.5–36.0)
MCV: 76.3 fL — AB (ref 79.5–101.0)
MONO#: 0.6 10*3/uL (ref 0.1–0.9)
MONO%: 8.1 % (ref 0.0–14.0)
NEUT#: 5.5 10*3/uL (ref 1.5–6.5)
NEUT%: 73 % (ref 38.4–76.8)
PLATELETS: 301 10*3/uL (ref 145–400)
RBC: 4.28 10*6/uL (ref 3.70–5.45)
RDW: 17.8 % — ABNORMAL HIGH (ref 11.2–14.5)
WBC: 7.5 10*3/uL (ref 3.9–10.3)
lymph#: 1.2 10*3/uL (ref 0.9–3.3)

## 2013-10-30 LAB — LACTATE DEHYDROGENASE (CC13): LDH: 156 U/L (ref 125–245)

## 2013-10-30 LAB — COMPREHENSIVE METABOLIC PANEL (CC13)
ALT: 34 U/L (ref 0–55)
ANION GAP: 11 meq/L (ref 3–11)
AST: 22 U/L (ref 5–34)
Albumin: 3.7 g/dL (ref 3.5–5.0)
Alkaline Phosphatase: 122 U/L (ref 40–150)
BILIRUBIN TOTAL: 0.41 mg/dL (ref 0.20–1.20)
BUN: 30.2 mg/dL — ABNORMAL HIGH (ref 7.0–26.0)
CO2: 23 meq/L (ref 22–29)
CREATININE: 1 mg/dL (ref 0.6–1.1)
Calcium: 11.7 mg/dL — ABNORMAL HIGH (ref 8.4–10.4)
Chloride: 107 mEq/L (ref 98–109)
GLUCOSE: 129 mg/dL (ref 70–140)
Potassium: 3.8 mEq/L (ref 3.5–5.1)
Sodium: 140 mEq/L (ref 136–145)
Total Protein: 6.8 g/dL (ref 6.4–8.3)

## 2013-10-30 MED ORDER — FERROUS SULFATE 325 (65 FE) MG PO TBEC: 325.0000 mg | DELAYED_RELEASE_TABLET | Freq: Every day | ORAL | Status: DC

## 2013-10-30 NOTE — Telephone Encounter (Signed)
Gave pt appt for lab and Md on may 2015

## 2013-10-31 NOTE — Progress Notes (Signed)
Met with patient during her follow-up appt with Dr. Juliann Mule to provide support and in follow-up to her 10/28/13 appt with Dr. Sondra Come during which she expressed hesitation to move forward with his recommendation for RT for her laryngeal cancer dx.  Pt reiterated concerns about SEs of hoarseness and radiation "burns".  Dr. Juliann Mule reiterated benefits of RT vs risks mentioned by Dr. Sondra Come.  She verbalized that she wants to wait until May when she completes current semester at [employer] at which point she wants additional imaging of nodule on her vocal cord.  She stated that if it is larger than what present imaging indicates she will be agreeable to radiation.  Continuing navigation as L1 patient (new patient).  [name], RN, BSN, [facility] Head & Neck Oncology Navigator [phone]

## 2013-11-01 NOTE — Progress Notes (Signed)
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 Biopsy only.   INTERVAL HISTORY: Anita [name] 68 y.o. female with a history of right breast cancer with metastases to the bone/lungs here for 3 month followup.  She was last seen by me on 05/18/2013.  She reports tolerating letrozole well without hot flash/night sweat, bone/muscle pain.  She has been seen by radiation oncology (Dr. Sondra Come) for consideration of XRT to her Right true vocal cord tumor with right level 3 lymph node on 10/28/2013. 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  She declined to undergo XRT following their meeting on 10/29/2013 (see [name] telephione note).   She has also been orginially evaluated by ENT (Dr. Ernesto Rutherford) due to complaints of mild hoarseness previously who did Macida scope and opted against biopsy due to her respiratory and cardiovascular status.  She was seen by her cardiologist Dr. Percival Spanish with instructions to start Imdur 60 once daily instead of 30 mg twice daily. .    In November (10th - 13 th) she was admitted with shortness of breath and found to be in acute on chronic combined systolic and diastolic HF. She  had positive cardiac enzymes - consistent with NSTEMI. She was cathed demonstrating 2 vessel disease (occluded RCA and moderate LAD stenosis) with recommendations for medical management. She was followed up by cardiology with plans for stenting the LAD if she remains symptomatic.  Her most recent echo revealed an EF now down to 30 - 35%.  She was seen on 12/16 by cardiology and was using her oxygen and desaturated to 85%.  She stated anginal symptoms relieved with her nitroglycerin. She has been started on lasix and losartan.   She denies  chest pain.  She has not noticed any more back pain compared to before therapy. She still has DOE from past COPD. She is no inhaler. However, she needs portable O2 when she ambulates since she cannot ambulate very far on room air. She has had an interim visit PET/CT as noted below. She still complains of hoarseness.   MEDICAL HISTORY: Past Medical History  Diagnosis Date  . Fibroid   . Morbid obesity   . 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 COPD (chronic obstructive pulmonary disease)   . Chronic diastolic CHF (congestive heart failure)   . NSTEMI (non-ST elevated myocardial infarction)     Cath November 2014 LAD 40% stenosis, first diagonal 80% stenosis, circumflex 20% stenosis, right coronary artery occluded. The EF was 35-40% at that time.  . Anemia     a. Adm 09/2012 with melena, Hgb 5.8 -> transfused. EGD/colonoscopy unrevealing.  . Breast cancer     a. Mets to bone. ER 100%/ PR 0%/Her2 neu negative.  [name] Ulcers of both lower legs   . Tobacco abuse   . Hyperlipidemia 02/11/2013  . Diabetes mellitus without complication   . Chronic respiratory failure     a. On O2 qhs. also portable O2.  . Chronic renal insufficiency   . Lesion of vocal cord     a. CT 05/2013 concerning for tumor.   INTERIM HISTORY: has DM2 (diabetes mellitus, type 2); COPD (chronic obstructive pulmonary disease); Chronic respiratory failure; Chronic combined systolic and  diastolic congestive heart failure; CAD (coronary artery disease); Chronic kidney disease; Anemia of chronic disease; Breast mass, right; History of tobacco abuse; Breast cancer metastasized to bone; Hyperlipidemia; Insomnia; Lesion of vocal cord; Intertrigo; and Vaginal yeast infection on her problem list.    ALLERGIES:  is allergic to omnipaque and benzene.  MEDICATIONS: has a current medication list which includes the following prescription(s): albuterol, aspirin, atorvastatin, calcium citrate, furosemide,  isosorbide mononitrate, letrozole, losartan, nitroglycerin, NON FORMULARY, potassium chloride sa, umeclidinium-vilanterol, and ferrous sulfate.  SURGICAL HISTORY:  Past Surgical History  Procedure Laterality Date  . Colonoscopy, esophagogastroduodenoscopy (egd) and esophageal dilation N/A 10/13/2012    Procedure: colonoscopy and egd;  Surgeon: Wonda Horner, MD;  Location: [facility];  Service: Endoscopy;  Laterality: N/A;   REVIEW OF SYSTEMS:   Constitutional: Denies fevers, chills or abnormal weight loss Eyes: Denies blurriness of vision Ears, nose, mouth, throat, and face: Denies mucositis or sore throat Respiratory: Denies cough, dyspnea or wheezes Cardiovascular: Denies palpitation, chest discomfort or lower extremity swelling Gastrointestinal:  Denies nausea, heartburn or change in bowel habits Skin: Denies abnormal skin rashes Lymphatics: Denies new lymphadenopathy or easy bruising Neurological:Denies numbness, tingling or new weaknesses Behavioral/Psych: Mood is stable, no new changes  All other systems were reviewed with the patient and are negative.  PHYSICAL EXAMINATION: ECOG PERFORMANCE STATUS: 1 - Symptomatic but completely ambulatory  Blood pressure 108/65, pulse 91, temperature 98.5 F (36.9 C), temperature source Oral, resp. rate 20, height $RemoveBe'5\' 2"'JitJZGNxm$  (1.575 m), weight 232 lb 4.8 oz (105.371 kg), SpO2 93.00%.  GENERAL:alert, no distress and comfortable morbidly obese woman SKIN: skin color, texture, turgor are normal, no rashes or significant lesions EYES: normal, Conjunctiva are pink and non-injected, sclera clear OROPHARYNX:no exudate, no erythema and lips, buccal mucosa, and tongue normal  NECK: supple, thyroid normal size, non-tender, without nodularity LYMPH:  no palpable lymphadenopathy in the cervical, axillary LUNGS: coarse breathing with slightly increased breathing rate HEART: regular rate & rhythm and no murmurs and no lower extremity edema BREAST:  deferred ABDOMEN:abdomen soft, non-tender and normal bowel sounds; obese Musculoskeletal:no cyanosis of digits and no clubbing  NEURO: alert & oriented x 3 with fluent speech, no focal motor/sensory deficits  LABORATORY DATA: CBC    Component Value Date/Time   WBC 7.5 10/30/2013 1454   WBC 9.3 07/17/2013 1435   RBC 4.28 10/30/2013 1454   RBC 4.45 07/17/2013 1435   RBC 3.18* 09/26/2012 0518   HGB 10.6* 10/30/2013 1454   HGB 11.0* 07/17/2013 1435   HCT 32.7* 10/30/2013 1454   HCT 33.2* 07/17/2013 1435   PLT 301 10/30/2013 1454   PLT 319.0 07/17/2013 1435   MCV 76.3* 10/30/2013 1454   MCV 74.7* 07/17/2013 1435   MCH 24.8* 10/30/2013 1454   MCH 24.9* 07/09/2013 0510   MCHC 32.5 10/30/2013 1454   MCHC 33.2 07/17/2013 1435   RDW 17.8* 10/30/2013 1454   RDW 20.2* 07/17/2013 1435   LYMPHSABS 1.2 10/30/2013 1454   LYMPHSABS 1.1 07/17/2013 1435   MONOABS 0.6 10/30/2013 1454   MONOABS 0.6 07/17/2013 1435   EOSABS 0.1 10/30/2013 1454   EOSABS 0.1 07/17/2013 1435   BASOSABS 0.0 10/30/2013 1454   BASOSABS 0.0 07/17/2013 1435   CMP     Component Value Date/Time   NA 140 10/30/2013 1454   NA 138 10/02/2013 1506   K 3.8 10/30/2013 1454   K 3.4* 10/02/2013 1506   CL 102 10/02/2013 1506   CL 101 02/11/2013 0950   CO2 23  10/30/2013 1454   CO2 27 10/02/2013 1506   GLUCOSE 129 10/30/2013 1454   GLUCOSE 133* 10/02/2013 1506   GLUCOSE 143* 02/11/2013 0950   BUN 30.2* 10/30/2013 1454   BUN 27* 10/02/2013 1506   CREATININE 1.0 10/30/2013 1454   CREATININE 1.0 10/02/2013 1506   CREATININE 1.25* 03/30/2013 1558   CALCIUM 11.7* 10/30/2013 1454   CALCIUM 10.1 10/02/2013 1506   CALCIUM 6.6* 09/29/2012 1118   PROT 6.8 10/30/2013 1454   PROT 6.6 04/04/2013 0056   ALBUMIN 3.7 10/30/2013 1454   ALBUMIN 3.7 04/04/2013 0056   AST 22 10/30/2013 1454   AST 12 04/04/2013 0056   ALT 34 10/30/2013 1454   ALT 16 04/04/2013 0056   ALKPHOS 122 10/30/2013 1454   ALKPHOS 116 04/04/2013 0056   BILITOT 0.41 10/30/2013 1454   BILITOT 0.2* 04/04/2013 0056   GFRNONAA 47* 07/09/2013  0510   GFRAA 54* 07/09/2013 0510   Results for MIKEILA, BURGEN (MRN [id number]) as of 08/28/2013 00:03  Ref. Range 05/13/2013 14:14 08/26/2013 12:45  CA 27.29 Latest Range: 0-39 U/mL 19 25   RADIOGRAPHIC STUDIES: Ct Chest W Contrast  05/13/2013   CLINICAL DATA:  Metastatic right breast cancer, osseous metastases, possible lung metastases  EXAM: CT CHEST, ABDOMEN, AND PELVIS WITH CONTRAST  TECHNIQUE: Multidetector CT imaging of the chest, abdomen and pelvis was performed following the standard protocol during bolus administration of intravenous contrast.  CONTRAST:  145mL OMNIPAQUE IOHEXOL 300 MG/ML  SOLN  COMPARISON:  CTA chest dated 03/13/2013. CT chest abdomen pelvis dated 10/08/2012.  FINDINGS: CT CHEST FINDINGS  Evaluation is constrained by respiratory motion.  Two 4-5 mm patchy/nodular opacities in the lateral right upper lobe (series 5/ image 19) and superior right lower lobe (series 5/image 21), grossly unchanged. Additional 4 mm subpleural nodule in the left upper lobe (series 5/ image 20), difficult to visualize on the priory study but likely unchanged from 10/08/2012.  No new/progressive pulmonary nodules. No pleural effusion or pneumothorax.  Visualized left thyroid is enlarged/nodular.  Mild cardiomegaly. No pericardial effusion. Atherosclerotic calcifications of the aortic arch.  No suspicious mediastinal, hilar, or axillary lymphadenopathy.  Widespread sclerotic osseous metastases in the visualized axial and appendicular skeleton, unchanged.  CT ABDOMEN AND PELVIS FINDINGS  Motion degraded images.  Liver, pancreas, and adrenal glands are within normal limits.  Spleen is within normal limits. Prior anterior splenic and perisplenic lesions have resolved.  Gallbladder is unremarkable. No intrahepatic or extrahepatic ductal dilatation.  2.3 x 1.8 cm anterior right upper pole renal cyst with suspected enhancing rim (series 4/image 13), although decreased, previously 5.1 x 4.4 cm. 1.5 cm posterior  interpolar left renal cyst (series 4/ image 11). 1.3 cm anterior left lower pole renal cyst (series 4/ image 19). No hydronephrosis.  No evidence bowel obstruction. Normal appendix.  Atherosclerotic calcifications of the abdominal aorta and branch vessels.  No abdominopelvic ascites.  No suspicious abdominopelvic lymphadenopathy.  Uterus is notable for calcified uterine fibroids. Bilateral ovaries are unremarkable.  Bladder is within normal limits.  Widespread sclerotic osseous metastases in the visualized axial and appendicular skeleton, unchanged.  IMPRESSION: CT CHEST IMPRESSION  Motion degraded images.  Scattered small pulmonary nodules measuring 4-5 mm, as described above, unchanged and indeterminate. Continued attention on follow-up is suggested.  Widespread sclerotic osseous metastases, unchanged.  CT ABDOMEN AND PELVIS IMPRESSION  Motion degraded images.  Prior anterior splenic and perisplenic lesions have resolved, of uncertain underlying etiology.  2.3 cm anterior right upper pole renal cyst with  suspected enhancing rim, although decreased.  Widespread sclerotic osseous metastases, unchanged.   Electronically Signed   By: Julian Hy M.D.   On: 05/13/2013 16:36   US Breast Right  05/04/2013   *RADIOLOGY REPORT*  Clinical Data:  Right breast cancer metastatic to bone.  The patient is currently taking Letrozole.  Evaluate  for response.  DIGITAL DIAGNOSTIC RIGHT MAMMOGRAM WITH CAD AND RIGHT BREAST ULTRASOUND:  Comparison:  11/07/2012  Findings:  ACR Breast Density Category b:  There are scattered areas of fibroglandular density.  Malignant type calcifications in the right upper outer quadrant posteriorly are again noted.  These cover an area approximately 2.3 x 2.7 x 5.0 cm in size.  The area of diffuse increased density in the upper outer quadrant of the right breast noted on prior study has decreased.  No definitive mass is identified.  Previously noted periareolar skin thickening has nearly  completely resolved.  Mammographic images were processed with CAD.  On physical exam, no mass is palpated in the outer portion of the right breast.  Ultrasound is performed, showing clustered masses in the 10 o'clock position right breast, 11 cm from the right nipple likely corresponding to the area of calcifications.  This area measures 3.2 x 1.2 x 2.7 cm.  This is likely residual carcinoma.  No other definite abnormality is identified.  If further evaluation of the right breast would be helpful, breast MRI may be helpful to better delineate the extent of residual disease.  IMPRESSION: Interval decrease in size of known carcinoma in the outer portion of the right breast.  RECOMMENDATION: Clinical treatment plan.  I have discussed the findings and recommendations with the patient. Results were also provided in writing at the conclusion of the visit.  If applicable, a reminder letter will be sent to the patient regarding the next appointment.  BI-RADS CATEGORY 6:  Known biopsy-proven malignancy - appropriate action should be taken.   Original Report Authenticated By: Ulyess Blossom, M.D.   Ct Abdomen Pelvis W Contrast  05/13/2013   CLINICAL DATA:  Metastatic right breast cancer, osseous metastases, possible lung metastases  EXAM: CT CHEST, ABDOMEN, AND PELVIS WITH CONTRAST  TECHNIQUE: Multidetector CT imaging of the chest, abdomen and pelvis was performed following the standard protocol during bolus administration of intravenous contrast.  CONTRAST:  153mL OMNIPAQUE IOHEXOL 300 MG/ML  SOLN  COMPARISON:  CTA chest dated 03/13/2013. CT chest abdomen pelvis dated 10/08/2012.  FINDINGS: CT CHEST FINDINGS  Evaluation is constrained by respiratory motion.  Two 4-5 mm patchy/nodular opacities in the lateral right upper lobe (series 5/ image 19) and superior right lower lobe (series 5/image 21), grossly unchanged. Additional 4 mm subpleural nodule in the left upper lobe (series 5/ image 20), difficult to visualize on the  priory study but likely unchanged from 10/08/2012.  No new/progressive pulmonary nodules. No pleural effusion or pneumothorax.  Visualized left thyroid is enlarged/nodular.  Mild cardiomegaly. No pericardial effusion. Atherosclerotic calcifications of the aortic arch.  No suspicious mediastinal, hilar, or axillary lymphadenopathy.  Widespread sclerotic osseous metastases in the visualized axial and appendicular skeleton, unchanged.  CT ABDOMEN AND PELVIS FINDINGS  Motion degraded images.  Liver, pancreas, and adrenal glands are within normal limits.  Spleen is within normal limits. Prior anterior splenic and perisplenic lesions have resolved.  Gallbladder is unremarkable. No intrahepatic or extrahepatic ductal dilatation.  2.3 x 1.8 cm anterior right upper pole renal cyst with suspected enhancing rim (series 4/image 13), although decreased, previously 5.1 x 4.4 cm.  1.5 cm posterior interpolar left renal cyst (series 4/ image 11). 1.3 cm anterior left lower pole renal cyst (series 4/ image 19). No hydronephrosis.  No evidence bowel obstruction. Normal appendix.  Atherosclerotic calcifications of the abdominal aorta and branch vessels.  No abdominopelvic ascites.  No suspicious abdominopelvic lymphadenopathy.  Uterus is notable for calcified uterine fibroids. Bilateral ovaries are unremarkable.  Bladder is within normal limits.  Widespread sclerotic osseous metastases in the visualized axial and appendicular skeleton, unchanged.  IMPRESSION: CT CHEST IMPRESSION  Motion degraded images.  Scattered small pulmonary nodules measuring 4-5 mm, as described above, unchanged and indeterminate. Continued attention on follow-up is suggested.  Widespread sclerotic osseous metastases, unchanged.  CT ABDOMEN AND PELVIS IMPRESSION  Motion degraded images.  Prior anterior splenic and perisplenic lesions have resolved, of uncertain underlying etiology.  2.3 cm anterior right upper pole renal cyst with suspected enhancing rim, although  decreased.  Widespread sclerotic osseous metastases, unchanged.   Electronically Signed   By: Julian Hy M.D.   On: 05/13/2013 16:36   Mm Digital Diagnostic Unilat R  05/04/2013   *RADIOLOGY REPORT*  Clinical Data:  Right breast cancer metastatic to bone.  The patient is currently taking Letrozole.  Evaluate  for response.  DIGITAL DIAGNOSTIC RIGHT MAMMOGRAM WITH CAD AND RIGHT BREAST ULTRASOUND:  Comparison:  11/07/2012  Findings:  ACR Breast Density Category b:  There are scattered areas of fibroglandular density.  Malignant type calcifications in the right upper outer quadrant posteriorly are again noted.  These cover an area approximately 2.3 x 2.7 x 5.0 cm in size.  The area of diffuse increased density in the upper outer quadrant of the right breast noted on prior study has decreased.  No definitive mass is identified.  Previously noted periareolar skin thickening has nearly completely resolved.  Mammographic images were processed with CAD.  On physical exam, no mass is palpated in the outer portion of the right breast.  Ultrasound is performed, showing clustered masses in the 10 o'clock position right breast, 11 cm from the right nipple likely corresponding to the area of calcifications.  This area measures 3.2 x 1.2 x 2.7 cm.  This is likely residual carcinoma.  No other definite abnormality is identified.  If further evaluation of the right breast would be helpful, breast MRI may be helpful to better delineate the extent of residual disease.  IMPRESSION: Interval decrease in size of known carcinoma in the outer portion of the right breast.  RECOMMENDATION: Clinical treatment plan.  I have discussed the findings and recommendations with the patient. Results were also provided in writing at the conclusion of the visit.  If applicable, a reminder letter will be sent to the patient regarding the next appointment.  BI-RADS CATEGORY 6:  Known biopsy-proven malignancy - appropriate action should be taken.    Original Report Authenticated By: Ulyess Blossom, M.D.   PET CT 08/10/2013 Additional history indicates that the patient has been evaluated for  a right vocal cord lesion and the patient has a mildly enlarged  right-sided level 3 cervical lymph node, as seen on a CT scan of the  neck dated 06/24/2013. The PET images show no convincing evidence  for FDG uptake above background soft tissue levels in the vocal  cords. However, the right level 3 lymph node described on the  previous neck CT does appear to be hypermetabolic above background  soft tissue levels. For this reason, metastatic disease within this  lymph node is a concern.  Electronically  Signed  By: Misty Stanley M.D.  On: 08/12/2013 11:53       Study Result    CLINICAL DATA: Subsequent treatment strategy for breast cancer.  EXAM:  NUCLEAR MEDICINE PET SKULL BASE TO THIGH  FASTING BLOOD GLUCOSE: Value: $RemoveBeforeD'136mg'[id number]$ /dl  TECHNIQUE:  18.4 mCi F-18 FDG was injected intravenously. CT data was obtained  and used for attenuation correction and anatomic localization only.  (This was not acquired as a diagnostic CT examination.) Additional  exam technical data entered on technologist worksheet.  COMPARISON: Chest CT from 05/13/2013. [name]  FINDINGS:  NECK  No hypermetabolic lymph nodes in the neck. By report, the patient  has invasive vocal cord tumor. There is no evidence for  hypermetabolism associated with the vocal cords on today's exam.  CHEST  No hypermetabolic mediastinal or hilar nodes. No suspicious  pulmonary nodules on the CT scan.  Heart is enlarged. There is no pericardial or pleural effusion.  Coronary artery calcification is noted.  The patient had bilateral pulmonary nodules on the previous study.  The 2 larger nodules within the right lung and are visualized today.  These are more difficult to characterize given the free breathing  used for CT image acquisition as part of a PET exam, but they do not  appear  significantly changed in the interval.  ABDOMEN/PELVIS  No abnormal hypermetabolic activity within the liver, pancreas,  adrenal glands, or spleen. No hypermetabolic lymph nodes in the  abdomen or pelvis.  The liver is enlarged and shows diffuse steatosis. 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 Cysts identified in both  kidneys, including the irregular cyst in the right kidney that  showed rim enhancement on the previous study. This lesion was  decreased on the prior study and appears smaller still on today's  exam.  SKELETON  There is diffuse sclerotic change within the skeleton although no  associated hypermetabolism to suggest active metastatic bony  involvement.  IMPRESSION:  No evidence for hypermetabolic soft tissue in the neck, chest,  abdomen, or pelvis.  Stable appearance of diffuse bony sclerotic skeletal lesions without  associated hyper metabolism. This is compatible with sequelae of  treated bony metastatic disease.   07/07/2013 Coronary angiography:  Coronary dominance: right  Left mainstem: Normal  Left anterior descending (LAD): Large vessel. There is 40% disease prior to the first diagonal with focal 80-85% disease immediately after the first diagonal. The first diagonal is a moderate size vessel that bifurcates. It has no significant disease.  Left circumflex (LCx): The LCx gives rise to a single large OM. There is 20% disease in the proximal LCx.  Right coronary artery (RCA): The RCA is diffusely and severely diseased in the mid vessel. It is occluded distally with left to right collaterals.  Left ventriculography: Left ventricular systolic function is abnormal, LVEF is estimated at 30-35%, There is akinesis of the basal to mid inferior wall and severe hypokinesis of the anterior wall, there is no significant mitral regurgitation  Final Conclusions:  1. Severe 2 vessel obstructive CAD. The RCA is chronically occluded distally with collaterals. There  is a moderate to severe stenosis in the LAD.  2. Severe LV dysfunction with high filling pressures.  Recommendations: Aggressive medical management of CAD and CHF. Needs IV diuresis. Will increase nitrates and add low dose bisoprolol. Add ACEi if renal function stable. Could consider stenting of the LAD if the patient remains symptomatic despite optimal medical therapy  ASSESSMENT: Metastatic breast cancer on palliative letrozole  PLAN:  1. Grade 2 dystolic dysfunction: she is euvolumic today on exam. She is on Lasix, nitrogylerin prn, losartan  2. Diabetes mellitus, type II. Diet controlled.  3. COPD: On albuterol prn. She remains on home oxygen. 4. Chronic renal insufficiency.  5. Metastatic breast cancer (ER pos/PR pos/ HER2 neg) with spread to bone.  -Clinically, she is having response with stable breast mass.  CT of Chest does however show a new lung nodule.  She is tolerating letrozole well without bone/muscle pain, hot flash/night sweat. She is taking Calcium to decrease risk of osteoporosis.  Recent PET showed no evidence for hypermetabolic soft tissue in the chest, abdomen, or pelvis and stable appearance of diffuse bony sclerotic skeletal lesions without associated hyper metabolism. This is compatible with sequelae of  treated bony metastatic disease. She continues to decline zometa or denosumab for her bone disease. Handouts were provided about metastatic disease. 6. Anemia of chronic disease: Past work up for reversible causes of anemia were negative.  7. Stage III (T1N1M0) Laryngeal Carcinoma, probable.  She has been seen by radiation oncology.  In the presence of Nurse Gerald Dexter, she declined XRT to the right vocal cord lesion with right level 3 lymph node described on the previous neck CT by Dr. Sondra Come.  She understands that this could become life threatening but requested additional imaging in May to determine velocity of growth.  She voiced understanding the benefits of therapy versus the  risks of waiting. We will discuss with ENT and Radiation oncology.  8. Follow up: In about 2-3 months with CBC, CMP.     All questions were answered. The patient knows to call the clinic with any problems, questions or concerns. We can certainly see the patient much sooner if necessary.  I spent 25 minutes counseling the patient face to face. The total time spent in the appointment was 40 minutes.    Jeidi Gilles, MD 11/01/2013 3:37 PM

## 2013-11-04 ENCOUNTER — Telehealth: Payer: Self-pay | Admitting: Cardiology

## 2013-11-04 NOTE — Telephone Encounter (Signed)
New message    Patient calling stating she need an adjustment to k+.

## 2013-11-04 NOTE — Telephone Encounter (Signed)
Returned call to patient she stated she had potassium blood level checked last week 10/30/13 at the cancer center and potassium is now normal.Stated she wanted to know if she needs to stay on [place].Advised she needs to continue current dose of Kdur.

## 2013-11-06 ENCOUNTER — Ambulatory Visit (INDEPENDENT_AMBULATORY_CARE_PROVIDER_SITE_OTHER): Payer: Medicare HMO | Admitting: Pulmonary Disease

## 2013-11-06 ENCOUNTER — Encounter: Payer: Self-pay | Admitting: Pulmonary Disease

## 2013-11-06 VITALS — BP 110/72 | HR 100 | Ht 62.0 in | Wt 239.0 lb

## 2013-11-06 DIAGNOSIS — J449 Chronic obstructive pulmonary disease, unspecified: Secondary | ICD-10-CM

## 2013-11-06 DIAGNOSIS — J9610 Chronic respiratory failure, unspecified whether with hypoxia or hypercapnia: Secondary | ICD-10-CM

## 2013-11-06 DIAGNOSIS — J383 Other diseases of vocal cords: Secondary | ICD-10-CM

## 2013-11-06 NOTE — Assessment & Plan Note (Signed)
She is to use 2 to 3 liters oxygen with sleep and exertion.

## 2013-11-06 NOTE — Patient Instructions (Signed)
Follow up in 6 months 

## 2013-11-06 NOTE — Progress Notes (Signed)
Chief Complaint  Patient presents with  . COPD    Breathing is unchanged. Report DOE, coughing. Denies chest tightness.    History of Present Illness: Anita [name] is a 68 y.o. female former smoker with COPD and possible sleep apnea using supplemental oxygen at night.  She also has history of stage IV breast cancer, and systolic/diastolic CHF.  She gets winded with exertion, but okay at rest.  She is using oxygen with sleep and activity.  She needs albuterol occasionally.  She uses anoro a few times per week >> this helps, but she does not like the taste.  TESTS: Echo 09/25/12 >> grade 2 diastolic dysfunction, mild RV dilation, EF 55% CT chest 10/08/12 >> 6 mm GGO nodule RUL, diffuse sclerotic changes on bone windows ONO with RA 10/23/12 >> Test time 7 hrs 53 min. Mean SpO2 89.3%, low SpO2 66%. Spent 46 min with SpO2 < 88%.  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  Anita [name]  has past surgical history that includes Colonoscopy, esophagogastroduodenoscopy (egd) and esophageal dilation (N/A, 10/13/2012).  Prior to Admission medications   Medication Sig Start Date End Date Taking? Authorizing Provider  albuterol (PROVENTIL HFA;VENTOLIN HFA) 108 (90 BASE) MCG/ACT inhaler Inhale 1 puff into the lungs 2 (two) times daily as needed for wheezing or shortness of breath.    Historical Provider, MD  aspirin EC 81 MG EC tablet Take 1 tablet (81 mg total) by mouth daily. 10/02/12   Leone Haven, MD  atorvastatin (LIPITOR) 40 MG tablet Take 1 tablet (40 mg total) by  mouth daily at 6 PM. 10/02/12   Leone Haven, MD  Calcium Citrate (CALCITRATE PO) Take 1 tablet by mouth daily.    Historical Provider, MD  pantoprazole (PROTONIX) 40 MG tablet Take 1 tablet (40 mg total) by mouth daily. 10/14/12   Leone Haven, MD  torsemide (DEMADEX) 20 MG tablet Take 1 tablet (20 mg total) by mouth daily. 10/15/12   Leone Haven, MD    Allergies  Allergen Reactions  . Omnipaque [Iohexol]     Pt claims she developed hives after given contrast  . Benzene Rash     Physical Exam:  General - No distress ENT - No sinus tenderness, MP 4, no oral exudate, no LAN, hoarse voice Cardiac - s1s2 regular, no murmur Chest - No wheeze/rales/dullness Back - No focal tenderness Abd - Soft, non-tender Ext - 1+ edema Neuro - Normal strength Skin - No rashes Psych - normal mood, and behavior   Assessment/Plan:  Chesley Mires, MD [facility] Pager:  [phone] 11/06/2013, 4:01 PM

## 2013-11-06 NOTE — Assessment & Plan Note (Signed)
She will have f/u neck scan in May.

## 2013-11-06 NOTE — Assessment & Plan Note (Signed)
She is using anoro a few times per week, and this has helped.  She can use albuterol as needed.

## 2013-11-10 ENCOUNTER — Other Ambulatory Visit: Payer: Self-pay | Admitting: Internal Medicine

## 2013-11-10 DIAGNOSIS — J383 Other diseases of vocal cords: Secondary | ICD-10-CM

## 2013-11-11 ENCOUNTER — Telehealth: Payer: Self-pay | Admitting: Internal Medicine

## 2013-11-11 NOTE — Telephone Encounter (Signed)
s.w. pt and advised on May appt.....mailed pt appt sched and letter

## 2013-12-04 ENCOUNTER — Ambulatory Visit (INDEPENDENT_AMBULATORY_CARE_PROVIDER_SITE_OTHER): Payer: Commercial Managed Care - HMO | Admitting: Family Medicine

## 2013-12-04 ENCOUNTER — Encounter: Payer: Self-pay | Admitting: Family Medicine

## 2013-12-04 VITALS — BP 120/54 | HR 93 | Temp 99.1°F | Ht 62.0 in | Wt 232.0 lb

## 2013-12-04 DIAGNOSIS — C7951 Secondary malignant neoplasm of bone: Secondary | ICD-10-CM

## 2013-12-04 DIAGNOSIS — J9610 Chronic respiratory failure, unspecified whether with hypoxia or hypercapnia: Secondary | ICD-10-CM

## 2013-12-04 DIAGNOSIS — C50919 Malignant neoplasm of unspecified site of unspecified female breast: Secondary | ICD-10-CM

## 2013-12-04 DIAGNOSIS — C7952 Secondary malignant neoplasm of bone marrow: Secondary | ICD-10-CM

## 2013-12-04 DIAGNOSIS — E119 Type 2 diabetes mellitus without complications: Secondary | ICD-10-CM

## 2013-12-04 DIAGNOSIS — R300 Dysuria: Secondary | ICD-10-CM

## 2013-12-04 DIAGNOSIS — J383 Other diseases of vocal cords: Secondary | ICD-10-CM

## 2013-12-04 DIAGNOSIS — R8290 Unspecified abnormal findings in urine: Secondary | ICD-10-CM

## 2013-12-04 DIAGNOSIS — R82998 Other abnormal findings in urine: Secondary | ICD-10-CM

## 2013-12-04 DIAGNOSIS — I5042 Chronic combined systolic (congestive) and diastolic (congestive) heart failure: Secondary | ICD-10-CM

## 2013-12-04 DIAGNOSIS — I509 Heart failure, unspecified: Secondary | ICD-10-CM

## 2013-12-04 LAB — POCT URINALYSIS DIPSTICK
Bilirubin, UA: NEGATIVE
GLUCOSE UA: NEGATIVE
Ketones, UA: NEGATIVE
Nitrite, UA: POSITIVE
PH UA: 5.5
Protein, UA: NEGATIVE
Spec Grav, UA: 1.015
UROBILINOGEN UA: 0.2

## 2013-12-04 LAB — POCT UA - MICROSCOPIC ONLY: RBC, urine, microscopic: >20

## 2013-12-04 LAB — POCT GLYCOSYLATED HEMOGLOBIN (HGB A1C): Hemoglobin A1C: 7

## 2013-12-04 MED ORDER — METRONIDAZOLE 500 MG PO TABS: 500.0000 mg | ORAL_TABLET | Freq: Two times a day (BID) | ORAL | Status: DC

## 2013-12-04 MED ORDER — CEPHALEXIN 500 MG PO CAPS: 500.0000 mg | ORAL_CAPSULE | Freq: Three times a day (TID) | ORAL | Status: DC

## 2013-12-04 MED ORDER — METFORMIN HCL 500 MG PO TABS: 500.0000 mg | ORAL_TABLET | Freq: Two times a day (BID) | ORAL | Status: DC

## 2013-12-04 NOTE — Patient Instructions (Signed)
It was nice to see you today.  Your urine is suspicious for UTI.  I am sending it for culture.  I will treat you with an antibiotic x 7 days.  Regarding your Diabetes - I am starting you on metformin.  Follow up in 3 months.

## 2013-12-06 DIAGNOSIS — R8290 Unspecified abnormal findings in urine: Secondary | ICD-10-CM | POA: Insufficient documentation

## 2013-12-06 NOTE — Assessment & Plan Note (Signed)
A1C obtained today and is increasing.  A1C, 7 today. Starting metformin. Follow up in 3 months.

## 2013-12-06 NOTE — Assessment & Plan Note (Signed)
Followed by Heme/Onc. I'm unsure of her prognosis as she has new lung nodules.  Will attempt to discuss this with her oncologist. I discussed living will and POA with her again today.

## 2013-12-06 NOTE — Assessment & Plan Note (Signed)
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  Patient does not seem to want intervention. Follow up scan in May.

## 2013-12-06 NOTE — Assessment & Plan Note (Signed)
Stable.  Patient euvolemic on exam. Will continue current therapy.

## 2013-12-06 NOTE — Assessment & Plan Note (Signed)
Urine suggestive of UTI.  Obtaining culture and starting on empiric Keflex.  Urine also revealed rare clue cell - will treat with Flagyl.

## 2013-12-06 NOTE — Progress Notes (Signed)
   Subjective:    Patient ID: Anita [name], female    DOB: 04/14/1946, 68 y.o.   MRN: [id number]  HPI 68 year old female with a complex PMH presents for follow up.  1) CAD; CHF, combined - Followed by Cardiology - Patient reports that she is doing well - She is compliant with all medications, but is only taking Lasix 40 mg daily - SOB is stable.  No chest pain or increasing LE edema.   2) Chronic respiratory failure - Followed by Pulm - On home O2 - Doing well  3) Malodorous urine - Has been present x 3 weeks - No associated dysuria.  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  Follow up imaging to be done in May to assess for growth. - No fever, chills, night sweats.  Soc Hx - Former smoker  Review of Systems Per HPI    Objective:   Physical Exam Filed Vitals:   12/04/13 1637  BP: 120/54  Pulse: 93  Temp: 99.1 F (37.3 C)   Exam: General: well appearing, NAD Cardiovascular: RRR. No murmurs, rubs, or gallops. Respiratory: CTAB. No rales or wheezing noted. Abdomen: soft, nontender, nondistended. Extremities:~1 LE edema.     Assessment & Plan:  See Problem list

## 2013-12-06 NOTE — Assessment & Plan Note (Signed)
Stable.  Doing well on home O2.

## 2013-12-07 ENCOUNTER — Telehealth: Payer: Self-pay | Admitting: *Deleted

## 2013-12-07 LAB — URINE CULTURE: Colony Count: >=100000

## 2013-12-07 MED ORDER — LEVOFLOXACIN 750 MG PO TABS: 750.0000 mg | ORAL_TABLET | Freq: Every day | ORAL | Status: DC

## 2013-12-07 NOTE — Addendum Note (Signed)
Addended by: Coral Spikes on: 12/07/2013 07:07 AM   Modules accepted: Orders, Medications

## 2013-12-07 NOTE — Telephone Encounter (Signed)
Left message on voicemail for patient to call back regarding urine culture.

## 2013-12-07 NOTE — Telephone Encounter (Signed)
Message copied by Nathaniel Man on Mon Dec 07, 2013  5:13 PM ------      Message from: Coral Spikes      Created: Mon Dec 07, 2013  7:07 AM       Please call Mrs. Hossain and inform her to stop the Keflex.  Her urine grew E. Coli that is resistant.  I have sent in a Rx for a different antibiotic - Levaquin.            Thanks            Jayce ------

## 2013-12-10 ENCOUNTER — Telehealth: Payer: Self-pay | Admitting: Family Medicine

## 2013-12-10 NOTE — Telephone Encounter (Signed)
After Hours Call:  Patient states her leg is in "excuciating pain" and she is unable to sleep. She is unable to tell me why her leg hurts, but she states she does not have an injury to the leg. I have advised patient that if her leg is bothering her enough to be seen now, she should come to the ED for evaluation since I cannot evaluate her over the phone. Otherwise, she can call the clinic at 8:30 to be seen. Patient agrees with this plan.  Cleveland Yarbro M. Lillien Petronio, M.D. 12/10/2013 3:00 AM

## 2013-12-14 ENCOUNTER — Telehealth: Payer: Self-pay

## 2013-12-14 NOTE — Telephone Encounter (Signed)
Received 2 messages this am stating she was in excrutiating pain in  R knee and leg and L low back. Is asking for a shot in her bones. "I can't sleep and I can't do nothing". LVM that an orthopedic MD would be who to call for shot in her bones. Asked her to please call us back to further clarify this issue.

## 2013-12-14 NOTE — Telephone Encounter (Signed)
lvm that Dr Juliann Mule said he would be glad to get Anita [name] set up for zometa infusion. She would need to get a dental evaluation. To please call us back to discuss in more detail her pain and her needs.

## 2013-12-15 ENCOUNTER — Telehealth: Payer: Self-pay | Admitting: Family Medicine

## 2013-12-15 ENCOUNTER — Telehealth: Payer: Self-pay | Admitting: Medical Oncology

## 2013-12-15 DIAGNOSIS — M25569 Pain in unspecified knee: Secondary | ICD-10-CM

## 2013-12-15 NOTE — Telephone Encounter (Signed)
Patient request an order of an xray of her knees.

## 2013-12-15 NOTE — Telephone Encounter (Signed)
Pt called and left 2 different messages regarding her leg pain.She stated that she had to call the paramedics to help her get out of the floor last evening. She is asking for Dr. Juliann Mule to fix her legs.  She left [phone]- the number where she could reached. I called this number and was unable to leave messages.I called her home number several times only to reach her answering machine. I left a message that Dr. Juliann Mule would like to give her zometa but she has to get her dental examination done first. She has bone disease and feels the zometa would really help her pain. I asked her to call me back to discuss.

## 2013-12-16 ENCOUNTER — Telehealth: Payer: Self-pay

## 2013-12-16 NOTE — Telephone Encounter (Signed)
S/w pt that zometa is given ivp. It can cause necrosis of the jaw and acheyness, fatigue, shortness of breath, anorexia and weight loss. Dr Juliann Mule said she would not have to see dentist b/c she has no teeth, she does have dentures. He would give it monthly. Asked pt to give [place] a call if she is willing to go forward with zometa treatment.

## 2013-12-16 NOTE — Telephone Encounter (Signed)
Done.  Patient can go to [facility].

## 2013-12-18 ENCOUNTER — Telehealth: Payer: Self-pay

## 2013-12-18 ENCOUNTER — Other Ambulatory Visit: Payer: Self-pay

## 2013-12-18 ENCOUNTER — Telehealth: Payer: Self-pay | Admitting: Oncology

## 2013-12-18 NOTE — Telephone Encounter (Signed)
s.w  pt husband and advised regarding to May tx added to 5.8.15 appt....ok and aware...husband wanted to say she only will get if the MD says to

## 2013-12-18 NOTE — Telephone Encounter (Signed)
Anita [name] called 3 times this am. She is willing to try zometa 1 time. Asked [place] to schedule it after her MD appt 5/8. POF sent. Anita [name] asked that Dr Juliann Mule speak to Dr Percival Spanish about her heart. She is worried that Zometa will cause heart attack. Asked that Dr Juliann Mule speak to Dr Lacinda Axon because Dr Lacinda Axon was trying to call him on 4/10 during pt's visit with Dr Lacinda Axon. Asked that Dr Juliann Mule add back and legs to her CT of soft tissue of neck that is scheduled 5/6.

## 2013-12-22 ENCOUNTER — Other Ambulatory Visit: Payer: Self-pay | Admitting: Internal Medicine

## 2013-12-23 ENCOUNTER — Telehealth: Payer: Self-pay | Admitting: Internal Medicine

## 2013-12-23 ENCOUNTER — Telehealth: Payer: Self-pay | Admitting: Family Medicine

## 2013-12-23 NOTE — Telephone Encounter (Signed)
Pt stated that her leg is hurting and she might have a blood clot.  Pt advised to schedule an appt or go to ED.  Pt stated she will wait until 12/30/2013.  Pt told if she thinks she has a blood clot in her leg she will need to come into clinic for evaluation now.  Pt hung phone up on nurse.  Derl Barrow, RN

## 2013-12-23 NOTE — Telephone Encounter (Signed)
I called to follow up about the nature of her leg pain.  I expressed that if she had swelling that we could facilitate obtaining an ultrasound duplex to rule out clots.  Provided call back number.

## 2013-12-23 NOTE — Telephone Encounter (Signed)
I called to follow up about the nature of her leg pain.  I expressed that if she had swelling that we could facilitate obtaining an ultrasound duplex to rule out clots.  Provided call back number.  

## 2013-12-23 NOTE — Telephone Encounter (Signed)
Left voice message for pt to call for an appt per Dr. Lacinda Axon for her leg pain and evaluation for blood clot.  Pt informed she should not wait until 12/30/2013.  Derl Barrow, RN

## 2013-12-23 NOTE — Telephone Encounter (Signed)
Patient calls, believes she may have a blood clot in her leg. Would like to speak to a nurse or Dr. Lacinda Axon about what she should do.

## 2013-12-25 ENCOUNTER — Telehealth: Payer: Self-pay

## 2013-12-25 NOTE — Telephone Encounter (Signed)
Pt called in AM stating she had talked to a pharmacist in the corporate office of [facility]. The pharmacist had suggested fosamax. Returned pt call after s/w Dr Juliann Mule. LVM.  He said these are similar medications and will have the same side effect profile. The difference is fosamax is a weekly pill, it does have GI side effects and you have to stay upright for 30 minutes. The zometa is monthly and is given IV. The pt has an appt with Dr Juliann Mule on 5/6 and can discuss with him at that time.

## 2013-12-26 ENCOUNTER — Encounter (HOSPITAL_COMMUNITY): Payer: Self-pay | Admitting: Emergency Medicine

## 2013-12-26 ENCOUNTER — Emergency Department (HOSPITAL_COMMUNITY): Payer: Medicare HMO

## 2013-12-26 ENCOUNTER — Emergency Department (HOSPITAL_COMMUNITY)
Admission: EM | Admit: 2013-12-26 | Discharge: 2013-12-26 | Disposition: A | Discharge status: Home / Self Care | Payer: Medicare HMO | Attending: Emergency Medicine | Admitting: Emergency Medicine

## 2013-12-26 DIAGNOSIS — Z872 Personal history of diseases of the skin and subcutaneous tissue: Secondary | ICD-10-CM | POA: Insufficient documentation

## 2013-12-26 DIAGNOSIS — Z87891 Personal history of nicotine dependence: Secondary | ICD-10-CM | POA: Diagnosis not present

## 2013-12-26 DIAGNOSIS — N189 Chronic kidney disease, unspecified: Secondary | ICD-10-CM | POA: Insufficient documentation

## 2013-12-26 DIAGNOSIS — M949 Disorder of cartilage, unspecified: Principal | ICD-10-CM

## 2013-12-26 DIAGNOSIS — J9610 Chronic respiratory failure, unspecified whether with hypoxia or hypercapnia: Secondary | ICD-10-CM | POA: Diagnosis not present

## 2013-12-26 DIAGNOSIS — Z853 Personal history of malignant neoplasm of breast: Secondary | ICD-10-CM | POA: Diagnosis not present

## 2013-12-26 DIAGNOSIS — Z9981 Dependence on supplemental oxygen: Secondary | ICD-10-CM | POA: Diagnosis not present

## 2013-12-26 DIAGNOSIS — Z79899 Other long term (current) drug therapy: Secondary | ICD-10-CM | POA: Insufficient documentation

## 2013-12-26 DIAGNOSIS — E785 Hyperlipidemia, unspecified: Secondary | ICD-10-CM | POA: Insufficient documentation

## 2013-12-26 DIAGNOSIS — I5032 Chronic diastolic (congestive) heart failure: Secondary | ICD-10-CM | POA: Diagnosis not present

## 2013-12-26 DIAGNOSIS — M79609 Pain in unspecified limb: Secondary | ICD-10-CM

## 2013-12-26 DIAGNOSIS — E669 Obesity, unspecified: Secondary | ICD-10-CM | POA: Insufficient documentation

## 2013-12-26 DIAGNOSIS — M7989 Other specified soft tissue disorders: Secondary | ICD-10-CM

## 2013-12-26 DIAGNOSIS — Z792 Long term (current) use of antibiotics: Secondary | ICD-10-CM | POA: Insufficient documentation

## 2013-12-26 DIAGNOSIS — E119 Type 2 diabetes mellitus without complications: Secondary | ICD-10-CM | POA: Insufficient documentation

## 2013-12-26 DIAGNOSIS — I252 Old myocardial infarction: Secondary | ICD-10-CM | POA: Diagnosis not present

## 2013-12-26 DIAGNOSIS — Z8742 Personal history of other diseases of the female genital tract: Secondary | ICD-10-CM | POA: Diagnosis not present

## 2013-12-26 DIAGNOSIS — J449 Chronic obstructive pulmonary disease, unspecified: Secondary | ICD-10-CM | POA: Insufficient documentation

## 2013-12-26 DIAGNOSIS — C7981 Secondary malignant neoplasm of breast: Secondary | ICD-10-CM | POA: Insufficient documentation

## 2013-12-26 DIAGNOSIS — D649 Anemia, unspecified: Secondary | ICD-10-CM | POA: Diagnosis not present

## 2013-12-26 DIAGNOSIS — Z7982 Long term (current) use of aspirin: Secondary | ICD-10-CM | POA: Diagnosis not present

## 2013-12-26 DIAGNOSIS — Z85830 Personal history of malignant neoplasm of bone: Secondary | ICD-10-CM | POA: Insufficient documentation

## 2013-12-26 DIAGNOSIS — Z8541 Personal history of malignant neoplasm of cervix uteri: Secondary | ICD-10-CM | POA: Diagnosis not present

## 2013-12-26 DIAGNOSIS — M899 Disorder of bone, unspecified: Secondary | ICD-10-CM | POA: Diagnosis not present

## 2013-12-26 DIAGNOSIS — J4489 Other specified chronic obstructive pulmonary disease: Secondary | ICD-10-CM | POA: Insufficient documentation

## 2013-12-26 LAB — BASIC METABOLIC PANEL
BUN: 23 mg/dL (ref 6–23)
CALCIUM: 11.9 mg/dL — AB (ref 8.4–10.5)
CO2: 26 meq/L (ref 19–32)
Chloride: 101 mEq/L (ref 96–112)
Creatinine, Ser: 1.04 mg/dL (ref 0.50–1.10)
GFR calc Af Amer: 63 mL/min — ABNORMAL LOW (ref >90)
GFR calc non Af Amer: 55 mL/min — ABNORMAL LOW (ref >90)
Glucose, Bld: 115 mg/dL — ABNORMAL HIGH (ref 70–99)
POTASSIUM: 4 meq/L (ref 3.7–5.3)
SODIUM: 140 meq/L (ref 137–147)

## 2013-12-26 LAB — CBC
HCT: 33.7 % — ABNORMAL LOW (ref 36.0–46.0)
HEMOGLOBIN: 10.9 g/dL — AB (ref 12.0–15.0)
MCH: 25.3 pg — AB (ref 26.0–34.0)
MCHC: 32.3 g/dL (ref 30.0–36.0)
MCV: 78.2 fL (ref 78.0–100.0)
Platelets: 285 10*3/uL (ref 150–400)
RBC: 4.31 MIL/uL (ref 3.87–5.11)
RDW: 17.8 % — ABNORMAL HIGH (ref 11.5–15.5)
WBC: 6.8 10*3/uL (ref 4.0–10.5)

## 2013-12-26 MED ORDER — HYDROCODONE-ACETAMINOPHEN 5-325 MG PO TABS: 2.0000 | ORAL_TABLET | ORAL | Status: DC | PRN

## 2013-12-26 MED ORDER — HYDROCODONE-ACETAMINOPHEN 5-325 MG PO TABS
2.0000 | ORAL_TABLET | Freq: Once | ORAL | Status: AC
  Administered 2013-12-26: 2 via ORAL
  Filled 2013-12-26: qty 2

## 2013-12-26 NOTE — Progress Notes (Signed)
VASCULAR LAB PRELIMINARY  PRELIMINARY  PRELIMINARY  PRELIMINARY  Right lower extremity venous Doppler completed.    Preliminary report:  There is no obvious evidence of DVT or SVT noted in the right lower extremity.  Iantha Fallen, RVT 12/26/2013, 7:17 PM

## 2013-12-26 NOTE — ED Notes (Signed)
Pt updated about wait time.

## 2013-12-26 NOTE — ED Notes (Signed)
Vascular tech taking patient for study.

## 2013-12-26 NOTE — ED Provider Notes (Signed)
8:17 PM Patient signed out to me by Dr. Jeanell Sparrow. Patient's disposition pending plain films of left hip and femur. Vitals stable and patient afebrile.   9:21 PM Patient's plain films show new lytic lesion of her femur which is likely causing her pain. I will touch base with Dr. Juliann Mule to update him, per patient request. Patient will be discharged with Vicodin and instructions to follow up with [facility].   9:49 PM I spoke with Dr. Juliann Mule and he is agreeable to plan. Patient has a follow up appointment with him in the coming week.   Results for orders placed during the hospital encounter of 12/26/13  CBC      Result Value Ref Range   WBC 6.8  4.0 - 10.5 K/uL   RBC 4.31  3.87 - 5.11 MIL/uL   Hemoglobin 10.9 (*) 12.0 - 15.0 g/dL   HCT 33.7 (*) 36.0 - 46.0 %   MCV 78.2  78.0 - 100.0 fL   MCH 25.3 (*) 26.0 - 34.0 pg   MCHC 32.3  30.0 - 36.0 g/dL   RDW 17.8 (*) 11.5 - 15.5 %   Platelets 285  150 - 400 K/uL  BASIC METABOLIC PANEL      Result Value Ref Range   Sodium 140  137 - 147 mEq/L   Potassium 4.0  3.7 - 5.3 mEq/L   Chloride 101  96 - 112 mEq/L   CO2 26  19 - 32 mEq/L   Glucose, Bld 115 (*) 70 - 99 mg/dL   BUN 23  6 - 23 mg/dL   Creatinine, Ser 1.04  0.50 - 1.10 mg/dL   Calcium 11.9 (*) 8.4 - 10.5 mg/dL   GFR calc non Af Amer 55 (*) >90 mL/min   GFR calc Af Amer 63 (*) >90 mL/min   Dg Hip Complete Right  12/26/2013   CLINICAL DATA:  Ongoing right hip pain  EXAM: RIGHT HIP - COMPLETE 2+ VIEW  COMPARISON:  None.  FINDINGS: No fracture or dislocation is seen.  Bilateral hip joint spaces are symmetric.  Visualized bony pelvis appears intact.  Heterogeneous sclerosis involving the pelvis and bilateral proximal femurs, likely corresponding to known metastases when correlating with prior CT.  Left renal staghorn calculus overlying the left mid abdomen.  IMPRESSION: No fracture or dislocation is seen.  Suspected osseous metastases.  Left renal staghorn calculus.   Electronically Signed   By:  Julian Hy M.D.   On: 12/26/2013 20:55   Dg Knee Complete 4 Views Right  12/26/2013   CLINICAL DATA:  Right knee pain, no injury  EXAM: RIGHT KNEE - COMPLETE 4+ VIEW  COMPARISON:  None.  FINDINGS: No fracture or dislocation is seen.  Vague intramedullary lucency in the distal humerus is possible on the oblique view, possibly prior median of with wide zone of transition, but without associated osseous destruction.  The joint spaces are preserved.  Suprapatellar and infrapatellar enthesopathy.  The visualized soft tissues are unremarkable.  No suprapatellar knee joint effusion.  IMPRESSION: Possible permeative lytic lesion involving the distal humerus, worrisome for metastasis, less likely myeloma.  Follow-up MRI knee with/without contrast is suggested for further evaluation.   Electronically Signed   By: Julian Hy M.D.   On: 12/26/2013 20:44      Alvina Chou, PA-C 12/26/13 2149

## 2013-12-26 NOTE — ED Provider Notes (Signed)
CSN: [id number]     Arrival date & time 12/26/13  1549 History   First MD Initiated Contact with Patient 12/26/13 1933     Chief Complaint  Patient presents with  . Leg Pain    Right leg     (Consider location/radiation/quality/duration/timing/severity/associated sxs/prior Treatment) HPI This is a 68 year old female who has metastatic breast cancer and states that she is having right leg pain. She has spoken with her oncologist and was sent to the hospital to assess for DVT. A Doppler ultrasound was done prior to my evaluation is negative for DVT. She states she has had the pain for 2 weeks. She has had no known trauma. She has been ambulatory on this leg. She has not noted any redness or swelling or warmth. She has had no similar symptoms in the past. She has not have any numbness, tingling, or weakness. Past Medical History  Diagnosis Date  . Fibroid   . Morbid obesity   . 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 COPD (chronic obstructive pulmonary disease)   . Chronic diastolic CHF (congestive heart failure)   . NSTEMI (non-ST elevated myocardial infarction)     Cath November 2014 LAD 40% stenosis, first diagonal 80% stenosis, circumflex 20% stenosis, right coronary artery occluded. The EF was 35-40% at that time.  . Anemia     a. Adm 09/2012 with melena, Hgb 5.8 -> transfused. EGD/colonoscopy unrevealing.  . Breast cancer     a. Mets to bone. ER 100%/ PR 0%/Her2 neu negative.  [name] Ulcers of both lower legs   . Tobacco abuse   . Hyperlipidemia 02/11/2013  . Diabetes mellitus without complication   . Chronic respiratory failure     a. On O2 qhs. also portable O2.  . Chronic renal insufficiency   . Lesion of vocal cord     a. CT 05/2013 concerning for tumor.   Past Surgical History  Procedure Laterality Date  . Colonoscopy, esophagogastroduodenoscopy (egd) and esophageal dilation N/A 10/13/2012    Procedure: colonoscopy and egd;  Surgeon: Wonda Horner, MD;  Location: [facility];  Service: Endoscopy;  Laterality: N/A;   Family History  Problem Relation Age of Onset  . Cancer Mother     leukemia  . Hypertension Mother   . Diabetes Father   . Heart disease Father     passed away due to heart attack   History  Substance Use Topics  . Smoking status: Former Smoker -- 2.00 packs/day for 40 years    Types: Cigarettes    Quit date: 09/24/2012  . Smokeless tobacco: Never Used  . Alcohol Use: No   OB History   Grav Para Term Preterm Abortions TAB SAB Ect Mult Living   '2 2 2       2     '$ Review of Systems  All other systems reviewed and are negative.     Allergies  Omnipaque and Benzene  Home Medications   Prior to Admission medications   Medication Sig Start Date End Date Taking? Authorizing Provider  albuterol (PROVENTIL HFA;VENTOLIN HFA) 108 (90 BASE) MCG/ACT inhaler Inhale 1 puff into the lungs 2 (two) times daily as needed for wheezing or shortness of breath.    Historical Provider, MD  aspirin EC 81 MG EC tablet Take 1 tablet (81 mg total) by mouth daily. 10/02/12   Leone Haven, MD  atorvastatin (LIPITOR) 40 MG tablet Take 1 tablet (40  mg total) by mouth daily at 6 PM. 02/11/13   Coral Spikes, DO  Calcium Citrate (CALCITRATE PO) Take 1 tablet by mouth daily.     Historical Provider, MD  ferrous sulfate 325 (65 FE) MG EC tablet Take 1 tablet (325 mg total) by mouth daily with breakfast. 10/30/13   [name], MD  furosemide (LASIX) 40 MG tablet Take 40 mg by mouth daily. 10/12/13   Minus Breeding, MD  isosorbide mononitrate (IMDUR) 60 MG 24 hr tablet Take 1 tablet (60 mg total) by mouth daily. 10/12/13   Minus Breeding, MD  letrozole [facility]) 2.5 MG tablet Take 1 tablet (2.5 mg total) by mouth daily. 10/12/13   [name], MD  levofloxacin (LEVAQUIN) 750 MG tablet Take 1 tablet (750 mg total) by mouth daily. 12/07/13   Coral Spikes, DO  losartan (COZAAR) 25 MG tablet Take 1 tablet (25 mg total) by mouth daily. 10/12/13    Minus Breeding, MD  metFORMIN (GLUCOPHAGE) 500 MG tablet Take 1 tablet (500 mg total) by mouth 2 (two) times daily with a meal. 12/04/13   Coral Spikes, DO  metroNIDAZOLE (FLAGYL) 500 MG tablet Take 1 tablet (500 mg total) by mouth 2 (two) times daily. 12/04/13   Coral Spikes, DO  nitroGLYCERIN (NITROSTAT) 0.4 MG SL tablet Place 1 tablet (0.4 mg total) under the tongue every 5 (five) minutes as needed for chest pain. 03/17/13   Cordelia Poche, MD  NON FORMULARY Place 3 L into the nose as needed. oxygen    Historical Provider, MD  potassium chloride SA (KLOR-CON M20) 20 MEQ tablet 2 tablets (40 mEq) AM and 1 tablet ( 20 mEq) PM 10/12/13   Minus Breeding, MD  Umeclidinium-Vilanterol (ANORO ELLIPTA) 62.5-25 MCG/INH AEPB Inhale 1 puff into the lungs daily. 08/12/13   Chesley Mires, MD   BP 130/57  Pulse 84  Temp(Src) 98.3 F (36.8 C) (Oral)  Resp 20  Ht $R'5\' 2"'tc$  (1.575 m)  Wt 232 lb (105.235 kg)  BMI 42.42 kg/m2  SpO2 97% Physical Exam  Nursing note and vitals reviewed. Constitutional: She is oriented to person, place, and time. She appears well-developed and well-nourished.  Obese  HENT:  Head: Normocephalic.  Right Ear: External ear normal.  Left Ear: External ear normal.  Mouth/Throat: Oropharynx is clear and moist.  Eyes: Conjunctivae and EOM are normal. Pupils are equal, round, and reactive to light.  Neck: Normal range of motion. Neck supple.  Cardiovascular: Normal rate, regular rhythm, normal heart sounds and intact distal pulses.   Pulmonary/Chest: Effort normal and breath sounds normal.  Abdominal: Soft. Bowel sounds are normal.  Musculoskeletal: Normal range of motion. She exhibits tenderness. She exhibits no edema.       Legs: Full active range of motion of right hip, right knee, and right foot. 2 small contusions noted on mid right lateral thigh. No induration or rash noted. Dorsal pedalis pulses are intact.  Neurological: She is alert and oriented to person, place, and time. She  has normal reflexes. She displays normal reflexes. No cranial nerve deficit. She exhibits normal muscle tone. Coordination normal.  Skin: Skin is warm and dry.  Psychiatric: She has a normal mood and affect. Her behavior is normal. Judgment and thought content normal.    ED Course  Procedures (including critical care time) Labs Review Labs Reviewed  CBC - Abnormal; Notable for the following:    Hemoglobin 10.9 (*)    HCT 33.7 (*)    MCH 25.3 (*)  RDW 17.8 (*)    All other components within normal limits  BASIC METABOLIC PANEL - Abnormal; Notable for the following:    Glucose, Bld 115 (*)    Calcium 11.9 (*)    GFR calc non Af Amer 55 (*)    GFR calc Af Amer 63 (*)    All other components within normal limits    Imaging Review No results found.   EKG Interpretation None      MDM    1- leg pain 2-hypercalcemia 3-metastatic breast cancer     Plain radiographs obtained.  Discussed with Ms. Barnet Glasgow and she will follow up radiographs and discharge if negative.   Shaune Pollack, MD 12/26/13 2001

## 2013-12-26 NOTE — Discharge Instructions (Signed)
You have a new bone lesion on your right femur seen on xray. Take Vicodin as needed for pain. Follow up with Dr. Juliann Mule for further evaluation and management of your symptoms.

## 2013-12-26 NOTE — ED Notes (Signed)
Pt c/o right leg pain. Pt sent in by Dr Francia Greaves her Cancer Dr to rule out blood clot. Pt reports pain and swelling to right leg for about 2 weeks.

## 2013-12-26 NOTE — ED Notes (Signed)
Patient transported to X-ray 

## 2013-12-26 NOTE — ED Notes (Signed)
Patient returned from X-ray 

## 2013-12-26 NOTE — ED Notes (Signed)
Pt returned from vascular lab study to waiting room with a negative exam.

## 2013-12-28 ENCOUNTER — Telehealth: Payer: Self-pay | Admitting: Cardiology

## 2013-12-28 ENCOUNTER — Telehealth: Payer: Self-pay | Admitting: Family Medicine

## 2013-12-28 ENCOUNTER — Telehealth: Payer: Self-pay

## 2013-12-28 NOTE — Telephone Encounter (Signed)
Left message to call back with concerns.

## 2013-12-28 NOTE — Telephone Encounter (Signed)
Pt calling because Dr Juliann Mule at the [facility] wants to start her on either Zamada or Sofamax.  Pt wants to know if either of these medications will affect her heart or her breathing.  Advised I will forward to Dr Percival Spanish for review and recommendations.  She is aware I will call back once I have an answer.

## 2013-12-28 NOTE — Telephone Encounter (Signed)
Pt had xrays done at ED on Saturday. No blood clot Just wanted dr Lacinda Axon to know

## 2013-12-28 NOTE — Telephone Encounter (Signed)
New message    Patient calling has questions regarding medication.

## 2013-12-28 NOTE — Telephone Encounter (Signed)
Pt lvm that her leg is a lot better today. Went to ER, xrays showed femur lesion, [place] was neg for DVT, MD made Rx for vicodin which helped a lot. Pain is 1/10 and she can walk on it. Dr Juliann Mule was on call on Sat and was consulted.

## 2013-12-30 ENCOUNTER — Ambulatory Visit (HOSPITAL_COMMUNITY)
Admission: RE | Admit: 2013-12-30 | Discharge: 2013-12-30 | Disposition: A | Discharge status: Home / Self Care | Payer: Medicare HMO | Source: Ambulatory Visit | Attending: Internal Medicine | Admitting: Internal Medicine

## 2013-12-30 ENCOUNTER — Encounter (HOSPITAL_COMMUNITY): Payer: Self-pay

## 2013-12-30 ENCOUNTER — Other Ambulatory Visit (HOSPITAL_BASED_OUTPATIENT_CLINIC_OR_DEPARTMENT_OTHER): Payer: Medicare Other

## 2013-12-30 DIAGNOSIS — C50919 Malignant neoplasm of unspecified site of unspecified female breast: Secondary | ICD-10-CM | POA: Insufficient documentation

## 2013-12-30 DIAGNOSIS — J383 Other diseases of vocal cords: Secondary | ICD-10-CM

## 2013-12-30 DIAGNOSIS — D638 Anemia in other chronic diseases classified elsewhere: Secondary | ICD-10-CM

## 2013-12-30 DIAGNOSIS — C7800 Secondary malignant neoplasm of unspecified lung: Secondary | ICD-10-CM

## 2013-12-30 DIAGNOSIS — C801 Malignant (primary) neoplasm, unspecified: Secondary | ICD-10-CM | POA: Insufficient documentation

## 2013-12-30 DIAGNOSIS — R599 Enlarged lymph nodes, unspecified: Secondary | ICD-10-CM | POA: Insufficient documentation

## 2013-12-30 DIAGNOSIS — C7951 Secondary malignant neoplasm of bone: Secondary | ICD-10-CM

## 2013-12-30 LAB — IRON AND TIBC CHCC
%SAT: 12 % — AB (ref 21–57)
IRON: 38 ug/dL — AB (ref 41–142)
TIBC: 303 ug/dL (ref 236–444)
UIBC: 265 ug/dL (ref 120–384)

## 2013-12-30 LAB — COMPREHENSIVE METABOLIC PANEL (CC13)
ALBUMIN: 3.4 g/dL — AB (ref 3.5–5.0)
ALT: 32 U/L (ref 0–55)
ANION GAP: 11 meq/L (ref 3–11)
AST: 23 U/L (ref 5–34)
Alkaline Phosphatase: 136 U/L (ref 40–150)
BILIRUBIN TOTAL: 0.34 mg/dL (ref 0.20–1.20)
BUN: 31.9 mg/dL — AB (ref 7.0–26.0)
CO2: 27 meq/L (ref 22–29)
Calcium: 11.9 mg/dL — ABNORMAL HIGH (ref 8.4–10.4)
Chloride: 105 mEq/L (ref 98–109)
Creatinine: 1.5 mg/dL — ABNORMAL HIGH (ref 0.6–1.1)
GLUCOSE: 148 mg/dL — AB (ref 70–140)
Potassium: 4 mEq/L (ref 3.5–5.1)
Sodium: 143 mEq/L (ref 136–145)
TOTAL PROTEIN: 6.5 g/dL (ref 6.4–8.3)

## 2013-12-30 LAB — CBC WITH DIFFERENTIAL/PLATELET
BASO%: 0.2 % (ref 0.0–2.0)
Basophils Absolute: 0 10*3/uL (ref 0.0–0.1)
EOS ABS: 0.2 10*3/uL (ref 0.0–0.5)
EOS%: 2.5 % (ref 0.0–7.0)
HCT: 33 % — ABNORMAL LOW (ref 34.8–46.6)
HEMOGLOBIN: 10.4 g/dL — AB (ref 11.6–15.9)
LYMPH%: 12.6 % — ABNORMAL LOW (ref 14.0–49.7)
MCH: 24.7 pg — ABNORMAL LOW (ref 25.1–34.0)
MCHC: 31.6 g/dL (ref 31.5–36.0)
MCV: 77.9 fL — ABNORMAL LOW (ref 79.5–101.0)
MONO#: 0.5 10*3/uL (ref 0.1–0.9)
MONO%: 7.7 % (ref 0.0–14.0)
NEUT%: 77 % — ABNORMAL HIGH (ref 38.4–76.8)
NEUTROS ABS: 5.5 10*3/uL (ref 1.5–6.5)
PLATELETS: 288 10*3/uL (ref 145–400)
RBC: 4.23 10*6/uL (ref 3.70–5.45)
RDW: 17.9 % — ABNORMAL HIGH (ref 11.2–14.5)
WBC: 7.2 10*3/uL (ref 3.9–10.3)
lymph#: 0.9 10*3/uL (ref 0.9–3.3)

## 2013-12-30 LAB — FERRITIN CHCC: FERRITIN: 139 ng/mL (ref 9–269)

## 2013-12-30 LAB — LACTATE DEHYDROGENASE (CC13): LDH: 167 U/L (ref 125–245)

## 2013-12-31 ENCOUNTER — Emergency Department (HOSPITAL_COMMUNITY): Payer: Medicare HMO

## 2013-12-31 ENCOUNTER — Encounter (HOSPITAL_COMMUNITY): Payer: Self-pay | Admitting: Emergency Medicine

## 2013-12-31 ENCOUNTER — Emergency Department (HOSPITAL_COMMUNITY)
Admission: EM | Admit: 2013-12-31 | Discharge: 2014-01-01 | Disposition: A | Discharge status: Home / Self Care | Payer: Medicare HMO | Attending: Emergency Medicine | Admitting: Emergency Medicine

## 2013-12-31 DIAGNOSIS — Z872 Personal history of diseases of the skin and subcutaneous tissue: Secondary | ICD-10-CM | POA: Diagnosis not present

## 2013-12-31 DIAGNOSIS — Z7982 Long term (current) use of aspirin: Secondary | ICD-10-CM | POA: Diagnosis not present

## 2013-12-31 DIAGNOSIS — I5032 Chronic diastolic (congestive) heart failure: Secondary | ICD-10-CM | POA: Insufficient documentation

## 2013-12-31 DIAGNOSIS — E119 Type 2 diabetes mellitus without complications: Secondary | ICD-10-CM | POA: Diagnosis not present

## 2013-12-31 DIAGNOSIS — Z79899 Other long term (current) drug therapy: Secondary | ICD-10-CM | POA: Insufficient documentation

## 2013-12-31 DIAGNOSIS — Z853 Personal history of malignant neoplasm of breast: Secondary | ICD-10-CM | POA: Diagnosis not present

## 2013-12-31 DIAGNOSIS — Z87891 Personal history of nicotine dependence: Secondary | ICD-10-CM | POA: Insufficient documentation

## 2013-12-31 DIAGNOSIS — Y929 Unspecified place or not applicable: Secondary | ICD-10-CM | POA: Insufficient documentation

## 2013-12-31 DIAGNOSIS — S42309A Unspecified fracture of shaft of humerus, unspecified arm, initial encounter for closed fracture: Secondary | ICD-10-CM | POA: Diagnosis not present

## 2013-12-31 DIAGNOSIS — I252 Old myocardial infarction: Secondary | ICD-10-CM | POA: Insufficient documentation

## 2013-12-31 DIAGNOSIS — X500XXA Overexertion from strenuous movement or load, initial encounter: Secondary | ICD-10-CM | POA: Insufficient documentation

## 2013-12-31 DIAGNOSIS — J449 Chronic obstructive pulmonary disease, unspecified: Secondary | ICD-10-CM | POA: Diagnosis not present

## 2013-12-31 DIAGNOSIS — Y9389 Activity, other specified: Secondary | ICD-10-CM | POA: Insufficient documentation

## 2013-12-31 DIAGNOSIS — D649 Anemia, unspecified: Secondary | ICD-10-CM | POA: Insufficient documentation

## 2013-12-31 DIAGNOSIS — J4489 Other specified chronic obstructive pulmonary disease: Secondary | ICD-10-CM | POA: Insufficient documentation

## 2013-12-31 DIAGNOSIS — E785 Hyperlipidemia, unspecified: Secondary | ICD-10-CM | POA: Diagnosis not present

## 2013-12-31 DIAGNOSIS — Z8742 Personal history of other diseases of the female genital tract: Secondary | ICD-10-CM | POA: Insufficient documentation

## 2013-12-31 DIAGNOSIS — S46909A Unspecified injury of unspecified muscle, fascia and tendon at shoulder and upper arm level, unspecified arm, initial encounter: Secondary | ICD-10-CM | POA: Diagnosis present

## 2013-12-31 DIAGNOSIS — S4980XA Other specified injuries of shoulder and upper arm, unspecified arm, initial encounter: Secondary | ICD-10-CM | POA: Diagnosis present

## 2013-12-31 DIAGNOSIS — N189 Chronic kidney disease, unspecified: Secondary | ICD-10-CM | POA: Insufficient documentation

## 2013-12-31 MED ORDER — HYDROCODONE-ACETAMINOPHEN 5-325 MG PO TABS
1.0000 | ORAL_TABLET | Freq: Once | ORAL | Status: AC
  Administered 2013-12-31: 1 via ORAL
  Filled 2013-12-31: qty 1

## 2013-12-31 MED ORDER — HYDROCODONE-ACETAMINOPHEN 5-325 MG PO TABS: 1.0000 | ORAL_TABLET | Freq: Four times a day (QID) | ORAL | Status: DC | PRN

## 2013-12-31 MED ORDER — OXYCODONE-ACETAMINOPHEN 5-325 MG PO TABS
2.0000 | ORAL_TABLET | Freq: Once | ORAL | Status: DC
  Filled 2013-12-31: qty 2

## 2013-12-31 MED ORDER — HYDROCODONE-ACETAMINOPHEN 5-325 MG PO TABS
2.0000 | ORAL_TABLET | Freq: Once | ORAL | Status: AC
  Administered 2013-12-31: 1 via ORAL
  Filled 2013-12-31: qty 2

## 2013-12-31 NOTE — Telephone Encounter (Signed)
Zometa is not contraindicated with cardiac disease.  It can rarely cause chest pain and can cause some edema.  I do not see that it would be contraindicated in CHF.  I do not know the drug Sofamax (sounds like a super store for couches).  If you mean Fosamax there would be no contraindication.

## 2013-12-31 NOTE — ED Provider Notes (Signed)
CSN: [id number]     Arrival date & time 12/31/13  2005 History  This chart was scribed for non-physician practitioner Montine Circle, PA-C working with Mervin Kung, MD by Eston Mould, ED Scribe. This patient was seen in room TR05C/TR05C and the patient's care was started at 9:36 PM .   Chief Complaint  Patient presents with  . Arm Pain   The history is provided by the patient. No language interpreter was used.   HPI Comments: Anita [name] is a 68 y.o. female who presents to the Emergency Department complaining of R arm pain that began 3 hours ago. Anita [name] states Anita [name] was closing her car door and reports hearing a "pop". Anita [name] states Anita [name] is unable to flex her R arm and ROM is limited due to pain. Denies any prior injuries to R arm.  Past Medical History  Diagnosis Date  . Fibroid   . Morbid obesity   . 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 COPD (chronic obstructive pulmonary disease)   . Chronic diastolic CHF (congestive heart failure)   . NSTEMI (non-ST elevated myocardial infarction)     Cath November 2014 LAD 40% stenosis, first diagonal 80% stenosis, circumflex 20% stenosis, right coronary artery occluded. The EF was 35-40% at that time.  . Anemia     a. Adm 09/2012 with melena, Hgb 5.8 -> transfused. EGD/colonoscopy unrevealing.  . Breast cancer     a. Mets to bone. ER 100%/ PR 0%/Her2 neu negative.  [name] Ulcers of both lower legs   . Tobacco abuse   . Hyperlipidemia 02/11/2013  . Diabetes mellitus without complication   . Chronic respiratory failure     a. On O2 qhs. also portable O2.  . Chronic renal insufficiency   . Lesion of vocal cord     a. CT 05/2013 concerning for tumor.   Past Surgical History  Procedure Laterality Date  . Colonoscopy, esophagogastroduodenoscopy (egd) and esophageal dilation N/A 10/13/2012    Procedure: colonoscopy and egd;  Surgeon: Wonda Horner, MD;  Location: [name];  Service: Endoscopy;   Laterality: N/A;   Family History  Problem Relation Age of Onset  . Cancer Mother     leukemia  . Hypertension Mother   . Diabetes Father   . Heart disease Father     passed away due to heart attack   History  Substance Use Topics  . Smoking status: Former Smoker -- 2.00 packs/day for 40 years    Types: Cigarettes    Quit date: 09/24/2012  . Smokeless tobacco: Never Used  . Alcohol Use: No   OB History   Grav Para Term Preterm Abortions TAB SAB Ect Mult Living   '2 2 2       2     '$ Review of Systems  Musculoskeletal: Positive for myalgias.  Skin: Negative for wound.   Allergies  Omnipaque and Benzene  Home Medications   Prior to Admission medications   Medication Sig Start Date End Date Taking? Authorizing Provider  albuterol (PROVENTIL HFA;VENTOLIN HFA) 108 (90 BASE) MCG/ACT inhaler Inhale 1 puff into the lungs 2 (two) times daily as needed for wheezing or shortness of breath.   Yes Historical Provider, MD  aspirin EC 81 MG EC tablet Take 1 tablet (81 mg total) by mouth daily. 10/02/12  Yes Leone Haven, MD  atorvastatin (LIPITOR) 40 MG tablet Take 1 tablet (40 mg total) by mouth daily at  6 PM. 02/11/13  Yes Coral Spikes, DO  Calcium Citrate (CALCITRATE PO) Take 1 tablet by mouth daily.    Yes Historical Provider, MD  ferrous sulfate 325 (65 FE) MG EC tablet Take 1 tablet (325 mg total) by mouth daily with breakfast. 10/30/13  Yes [name], MD  furosemide (LASIX) 40 MG tablet Take 40 mg by mouth daily. 10/12/13  Yes Minus Breeding, MD  HYDROcodone-acetaminophen (NORCO/VICODIN) 5-325 MG per tablet Take 2 tablets by mouth every 4 (four) hours as needed. 12/26/13  Yes Kaitlyn Szekalski, PA-C  isosorbide mononitrate (IMDUR) 60 MG 24 hr tablet Take 1 tablet (60 mg total) by mouth daily. 10/12/13  Yes Minus Breeding, MD  letrozole [facility]) 2.5 MG tablet Take 1 tablet (2.5 mg total) by mouth daily. 10/12/13  Yes [name], MD  losartan (COZAAR) 25 MG tablet Take 1 tablet (25 mg  total) by mouth daily. 10/12/13  Yes Minus Breeding, MD  metFORMIN (GLUCOPHAGE) 500 MG tablet Take 1 tablet (500 mg total) by mouth 2 (two) times daily with a meal. 12/04/13  Yes Coral Spikes, DO  nitroGLYCERIN (NITROSTAT) 0.4 MG SL tablet Place 1 tablet (0.4 mg total) under the tongue every 5 (five) minutes as needed for chest pain. 03/17/13  Yes Cordelia Poche, MD  NON FORMULARY Place 3 L into the nose as needed. oxygen   Yes Historical Provider, MD  potassium chloride SA (KLOR-CON M20) 20 MEQ tablet 2 tablets (40 mEq) AM and 1 tablet ( 20 mEq) PM 10/12/13  Yes Minus Breeding, MD  Umeclidinium-Vilanterol (ANORO ELLIPTA) 62.5-25 MCG/INH AEPB Inhale 1 puff into the lungs daily. 08/12/13  Yes Chesley Mires, MD   BP 133/68  Pulse 95  Temp(Src) 97.8 F (36.6 C) (Oral)  Resp 16  Ht $R'5\' 2"'rr$  (1.575 m)  Wt 232 lb (105.235 kg)  BMI 42.42 kg/m2  SpO2 95%  Physical Exam  Nursing note and vitals reviewed. Constitutional: Anita [name] is oriented to person, place, and time. Anita [name] appears well-developed and well-nourished. No distress.  HENT:  Head: Normocephalic and atraumatic.  Eyes: EOM are normal.  Neck: Neck supple. No tracheal deviation present.  Cardiovascular: Normal rate.   Intact distal pulses; Brisk cap refills.  Pulmonary/Chest: Effort normal. No respiratory distress.  Musculoskeletal: Normal range of motion.  R elbow ROM limited secondary to pain, clicking felt with assisted flexion. No other bony abnormality or deformity.   Neurological: Anita [name] is alert and oriented to person, place, and time.  Sensation intact.  Skin: Skin is warm and dry.  Psychiatric: Anita [name] has a normal mood and affect. Her behavior is normal.   ED Course  Procedures  DIAGNOSTIC STUDIES: Oxygen Saturation is 95% on RA, normal by my interpretation.    COORDINATION OF CARE: 9:40 PM-Discussed treatment plan which includes R arm X-ray. Pt agreed to plan.   11:04 PM-Spoke with Dr. Fredna Dow and referred pt to be seen by general  orthopedics.  11:18 PM-Dr. Wallie Char evaluated pt. Informed pt of radiology findings.  Labs Review Labs Reviewed - No data to display  Imaging Review Dg Elbow Complete Right  12/31/2013   CLINICAL DATA:  Right posterior elbow pain  EXAM: RIGHT ELBOW - COMPLETE 3+ VIEW  COMPARISON:  None.  FINDINGS: There is a lucent lesion in the proximal radial metaphysis concerning for metastatic disease versus benign fibro-osseous lesion. There is a narrow zone of transition. There is a pathologic nondisplaced fracture through the proximal anterior cortex.  There is a oblique displaced and apex dorsally  angulated distal humeral diaphysis fracture.  There is no dislocation.  IMPRESSION: 1. Lucent lesion in the proximal radial metaphysis concerning for metastatic disease versus benign fibro-osseous lesion, with a pathologic nondisplaced fracture through the proximal anterior cortex. 2. Oblique displaced and apex dorsally angulated distal humeral diaphysis fracture.   Electronically Signed   By: [name]   On: 12/31/2013 22:10   Ct Soft Tissue Neck Wo Contrast  12/30/2013   CLINICAL DATA:  Vocal cord lesion. Hoarseness. Metastatic breast cancer. IV contrast was not administered due to contrast allergy and patient not desiring to do a steroid premedication.  EXAM: CT NECK WITHOUT CONTRAST  TECHNIQUE: Multidetector CT imaging of the neck was performed following the standard protocol without intravenous contrast.  COMPARISON:  06/24/2013 neck CT.  05/13/2013 chest CT.  FINDINGS: The visualized portion of the brain is grossly unremarkable. Orbits are unremarkable. Paranasal sinuses and mastoid air cells are clear.  Evaluation of the soft tissues of the neck is limited by lack of IV contrast. The nasopharynx, oropharynx, and oral cavity are grossly unremarkable. Slight nodularity along the anterior aspect of the right true vocal cord on the prior study is less conspicuous on the current examination. Asymmetry of the  overlying right thyroid cartilage is unchanged. Asymmetry is again noted of the laryngeal ventricles, with the right being larger than the left. Diffusely heterogeneous thyroid gland is again noted. Submandibular and parotid glands are unremarkable.  Right level IV lymph node appears slightly larger than on the prior study, measuring 11 x 8 mm (series 2, image 80 to, previously 9 x 6 mm upon remeasurement on axial images). No new enlarged lymph nodes are identified.  5 mm right upper lobe ground-glass nodule is unchanged (series 4, image 51). 5 mm left upper lobe ground-glass nodule appears slightly larger than prior chest CT (series 4, image 50). Vertebral bodies are diffusely heterogeneous with areas of sclerosis as well as patchy lucency the lucent lesions have increased from the prior CT. For example, there is a 6 mm lesion in the base the dens which is new or increased. Lesion anteriorly in the C3 vertebral body has increased in size, and there also multiple new or larger lucent lesions in the upper thoracic spine. Multilevel spondylosis is present in the cervical spine.  IMPRESSION: 1. Minimally increased size of a right level IV lymph node. 2. Slightly decreased conspicuity of right vocal cord nodularity described on the prior study with unchanged asymmetry of the overlying thyroid cartilage. 3. Slightly increased size of left upper lobe lung nodule. 4. Multiple lytic lesions in the visualized spine, new/increased from the prior study and concerning for progressive osseous metastatic disease.   Electronically Signed   By: Logan Bores   On: 12/30/2013 17:25   Dg Humerus Right  12/31/2013   CLINICAL DATA:  Right elbow and distal humerus pain. Heard pop when reaching for door.  EXAM: RIGHT HUMERUS - 2+ VIEW  COMPARISON:  Right elbow radiographs 12/31/2013  FINDINGS: There is an oblique complete fracture through the distal third of the right humerus diaphysis. At the level of the fracture, there is probable  lucent lesion measuring approximately 2.2 cm diameter in the intact portion of the proximal side of the fracture.  There is also discrete lucent lesion in the distal right clavicle. Possible lucent lesion in the right acromion.  IMPRESSION: Acute fracture of the distal right humerus diaphysis. This is highly suspicious for a pathologic fracture. Patient has known bony metastatic disease secondary  to breast cancer, per the electronic medical record.  Lucent lesions in the distal clavicle and acromion are suspicious for metastatic disease.   Electronically Signed   By: Curlene Dolphin M.D.   On: 12/31/2013 22:33     EKG Interpretation None     MDM   Final diagnoses:  Humerus fracture    Patient with pathologic fracture of right humerus.  Patient seen by and discussed with Dr. Rogene Houston.  Patient discussed with Dr. Berenice Primas, from orthopedics, who will see the patient on Monday. Will place the patient in a coaptation splint. No evidence of compartment syndrome. No skin tenting. Distal sensation and pulses are intact. The patient is not in any apparent distress. Her pain is well-controlled. Discharged home. Patient understands agrees with plan. Anita [name] is stable and ready for discharge.  I personally performed the services described in this documentation, which was scribed in my presence. The recorded information has been reviewed and is accurate.     Montine Circle, PA-C 12/31/13 [id number]

## 2013-12-31 NOTE — Telephone Encounter (Signed)
Pt is aware of MD's recommendation that those two medications Zometa and Fosamax are not contraindicated for CHF. Pt verbalized understanding.

## 2013-12-31 NOTE — ED Notes (Signed)
Pt states at 18:30 she was pulling something and felt a pop in her right arm. Pt states her right elbow hurts and she thinks she broke that right arm. Pt CNS intact, pt able to wiggle finger and cap refill less than 3. Pt unwilling to move right arm in triage.

## 2013-12-31 NOTE — Discharge Instructions (Signed)
Humerus Fracture, Treated with Immobilization °The humerus is the large bone in your upper arm. You have a broken (fractured) humerus. These fractures are easily diagnosed with X-rays. °TREATMENT  °Simple fractures which will heal without disability are treated with simple immobilization. Immobilization means you will wear a cast, splint, or sling. You have a fracture which will do well with immobilization. The fracture will heal well simply by being held in a good position until it is stable enough to begin range of motion exercises. Do not take part in activities which would further injure your arm.  °HOME CARE INSTRUCTIONS  °· Put ice on the injured area. °· Put ice in a plastic bag. °· Place a towel between your skin and the bag. °· Leave the ice on for 15-20 minutes, 03-04 times a day. °· If you have a cast: °· Do not scratch the skin under the cast using sharp or pointed objects. °· Check the skin around the cast every day. You may put lotion on any red or sore areas. °· Keep your cast dry and clean. °· If you have a splint: °· Wear the splint as directed. °· Keep your splint dry and clean. °· You may loosen the elastic around the splint if your fingers become numb, tingle, or turn cold or blue. °· If you have a sling: °· Wear the sling as directed. °· Do not put pressure on any part of your cast or splint until it is fully hardened. °· Your cast or splint can be protected during bathing with a plastic bag. Do not lower the cast or splint into water. °· Only take over-the-counter or prescription medicines for pain, discomfort, or fever as directed by your caregiver. °· Do range of motion exercises as instructed by your caregiver. °· Follow up as directed by your caregiver. This is very important in order to avoid permanent injury or disability and chronic pain. °SEEK IMMEDIATE MEDICAL CARE IF:  °· Your skin or nails in the injured arm turn blue or gray. °· Your arm feels cold or numb. °· You develop severe  pain in the injured arm. °· You are having problems with the medicines you were given. °MAKE SURE YOU:  °· Understand these instructions. °· Will watch your condition. °· Will get help right away if you are not doing well or get worse. °Document Released: 11/19/2000 Document Revised: 11/05/2011 Document Reviewed: 09/27/2010 °[employer]® Patient Information ©2014 [employer]. ° °

## 2014-01-01 ENCOUNTER — Ambulatory Visit (HOSPITAL_BASED_OUTPATIENT_CLINIC_OR_DEPARTMENT_OTHER): Payer: Medicare HMO

## 2014-01-01 ENCOUNTER — Other Ambulatory Visit: Payer: Self-pay | Admitting: Medical Oncology

## 2014-01-01 ENCOUNTER — Other Ambulatory Visit: Payer: Medicare HMO

## 2014-01-01 ENCOUNTER — Ambulatory Visit (HOSPITAL_BASED_OUTPATIENT_CLINIC_OR_DEPARTMENT_OTHER): Payer: Commercial Managed Care - HMO | Admitting: Internal Medicine

## 2014-01-01 ENCOUNTER — Telehealth: Payer: Self-pay | Admitting: Medical Oncology

## 2014-01-01 ENCOUNTER — Telehealth: Payer: Self-pay | Admitting: *Deleted

## 2014-01-01 ENCOUNTER — Encounter: Payer: Self-pay | Admitting: Internal Medicine

## 2014-01-01 VITALS — BP 138/70 | HR 106 | Temp 97.4°F | Resp 18 | Ht 62.0 in | Wt 234.5 lb

## 2014-01-01 DIAGNOSIS — E119 Type 2 diabetes mellitus without complications: Secondary | ICD-10-CM

## 2014-01-01 DIAGNOSIS — D638 Anemia in other chronic diseases classified elsewhere: Secondary | ICD-10-CM

## 2014-01-01 DIAGNOSIS — J449 Chronic obstructive pulmonary disease, unspecified: Secondary | ICD-10-CM

## 2014-01-01 DIAGNOSIS — C50919 Malignant neoplasm of unspecified site of unspecified female breast: Secondary | ICD-10-CM

## 2014-01-01 DIAGNOSIS — C7800 Secondary malignant neoplasm of unspecified lung: Secondary | ICD-10-CM

## 2014-01-01 DIAGNOSIS — S42309A Unspecified fracture of shaft of humerus, unspecified arm, initial encounter for closed fracture: Secondary | ICD-10-CM | POA: Diagnosis not present

## 2014-01-01 DIAGNOSIS — J383 Other diseases of vocal cords: Secondary | ICD-10-CM

## 2014-01-01 DIAGNOSIS — C7952 Secondary malignant neoplasm of bone marrow: Secondary | ICD-10-CM

## 2014-01-01 DIAGNOSIS — N189 Chronic kidney disease, unspecified: Secondary | ICD-10-CM

## 2014-01-01 DIAGNOSIS — I519 Heart disease, unspecified: Secondary | ICD-10-CM

## 2014-01-01 DIAGNOSIS — C7951 Secondary malignant neoplasm of bone: Secondary | ICD-10-CM

## 2014-01-01 DIAGNOSIS — E860 Dehydration: Secondary | ICD-10-CM

## 2014-01-01 DIAGNOSIS — J4489 Other specified chronic obstructive pulmonary disease: Secondary | ICD-10-CM

## 2014-01-01 MED ORDER — ZOLEDRONIC ACID 4 MG/100ML IV SOLN
4.0000 mg | Freq: Once | INTRAVENOUS | Status: AC
  Administered 2014-01-01: 4 mg via INTRAVENOUS
  Filled 2014-01-01: qty 100

## 2014-01-01 MED ORDER — SODIUM CHLORIDE 0.9 % IV SOLN
INTRAVENOUS | Status: DC
  Administered 2014-01-01: 15:00:00 via INTRAVENOUS

## 2014-01-01 MED ORDER — PALBOCICLIB 125 MG PO CAPS: ORAL_CAPSULE | ORAL | Status: DC

## 2014-01-01 MED ORDER — OXYCODONE-ACETAMINOPHEN 5-325 MG PO TABS
1.0000 | ORAL_TABLET | Freq: Once | ORAL | Status: AC
  Administered 2014-01-01: 1 via ORAL

## 2014-01-01 MED ORDER — OXYCODONE-ACETAMINOPHEN 5-325 MG PO TABS
ORAL_TABLET | ORAL | Status: AC
  Filled 2014-01-01: qty 1

## 2014-01-01 NOTE — Patient Instructions (Signed)

## 2014-01-01 NOTE — Telephone Encounter (Signed)
Pt called and left a message that the car door hit her arm last night and broke it. She went to the ER and has a cast on. She will follow up with orthopedic physician Monday. She see Dr. Juliann Mule this afternoon.

## 2014-01-01 NOTE — Progress Notes (Signed)
Patient continuously asking RNs to "do something with this arm" referring to her R wrapped arm. She previously visited [facility] ED for broken arm which was then wrapped. Patient c/o it's too tight. Pulse WNL, 2 fingers able to be inserted between wrap and skin. Patient able to wiggle fingers and squeeze RN's hand. Pain medication given see MAR. Per orders, patient to receive IV fluids at 100 ml/hr for 2 hours. Patient refusing to stay for IVF at this time, asking RN to "let me go." This RN stopped fluids and removed PIV at patient's request. Patient received approx 1/2 of 250 cc NS bag. Patient instructed to return to the ED d/t rating her arm pain 10/10 despite pain medication and her need for assistance regarding the wrap. Patient verbalized understanding. Patient discharged to lobby with company in stable condition.

## 2014-01-01 NOTE — Telephone Encounter (Signed)
Per staff message and POF I have scheduled appts.  JMW Per staff message and POF I have scheduled appts.  JMW

## 2014-01-01 NOTE — Progress Notes (Signed)
To provide support, encouragement and care continuity, met with patient during follow-up appt with Dr. Juliann Mule.  Patient agreed to RadOnc referral to Dr. Sondra Come.  I will continue to follow as she enters into the phase of treatment.  Continuing navigation as L1 patient (new patient).  [name], RN, BSN, [facility] Navigator [phone]

## 2014-01-01 NOTE — Progress Notes (Signed)
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 Biopsy only. Added Palbociclib 125 mg daily for 21 days on and 7 days off on 01/01/2014.  INTERVAL HISTORY: Anita [name] 68 y.o. female with a history of right breast cancer with metastases to the bone/lungs here for 3 month followup.  She was last seen by me on 10/30/2013.    She reports a broken arm yesterday.  She states that she was closing the car door and heard a "pop".  She is scheduled for a new patient orthopedics appointment on Monday.  She had an interim CT of neck without contrast to evaluate her vocal cord lesion (as detailed below).  She reports worsening bone pain in her lower extremities.  She denies recent hospitalizations.  She is following with her cardiologisit.    MEDICAL HISTORY: Past Medical History  Diagnosis Date  . Fibroid   . Morbid obesity   . 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 COPD (chronic obstructive pulmonary disease)   . Chronic diastolic CHF (congestive heart failure)   . NSTEMI (non-ST elevated myocardial infarction)     Cath November 2014 LAD 40% stenosis, first diagonal 80% stenosis, circumflex 20% stenosis, right coronary artery occluded. The EF was 35-40% at that time.  . Anemia     a. Adm 09/2012 with melena, Hgb 5.8 -> transfused. EGD/colonoscopy unrevealing.  . Breast cancer     a. Mets to bone. ER 100%/ PR 0%/Her2 neu negative.  [name] Ulcers of both lower legs   . Tobacco abuse   . Hyperlipidemia 02/11/2013  . Diabetes mellitus without complication   . Chronic respiratory failure     a. On O2 qhs. also portable O2.  . Chronic renal insufficiency   . Lesion of vocal cord     a. CT 05/2013 concerning for  tumor.   INTERIM HISTORY: has DM2 (diabetes mellitus, type 2); COPD (chronic obstructive pulmonary disease); Chronic respiratory failure; Chronic combined systolic and diastolic congestive heart failure; CAD (coronary artery disease); Chronic kidney disease; Anemia of chronic disease; Breast mass, right; History of tobacco abuse; Breast cancer metastasized to bone; Hyperlipidemia; Insomnia; Lesion of vocal cord; Intertrigo; Vaginal yeast infection; and Malodorous urine on her problem list.    ALLERGIES:  is allergic to omnipaque and benzene.  MEDICATIONS: has a current medication list which includes the following prescription(s): albuterol, aspirin, atorvastatin, furosemide, isosorbide mononitrate, letrozole, losartan, metformin, nitroglycerin, NON FORMULARY, calcium citrate, ferrous sulfate, hydrocodone-acetaminophen, palbociclib, potassium chloride sa, and umeclidinium-vilanterol, and the following Facility-Administered Medications: sodium chloride.  SURGICAL HISTORY:  Past Surgical History  Procedure Laterality Date  . Colonoscopy, esophagogastroduodenoscopy (egd) and esophageal dilation N/A 10/13/2012    Procedure: colonoscopy and egd;  Surgeon: Wonda Horner, MD;  Location: [facility];  Service: Endoscopy;  Laterality: N/A;   REVIEW OF SYSTEMS:   Constitutional: Denies fevers, chills or abnormal weight loss Eyes: Denies blurriness of vision Ears, nose, mouth, throat, and face: Denies mucositis or sore throat Respiratory: Denies cough, dyspnea or wheezes Cardiovascular: Denies palpitation, chest discomfort or lower extremity swelling Gastrointestinal:  Denies nausea, heartburn or change in bowel habits Skin: Denies abnormal skin rashes Lymphatics: Denies new lymphadenopathy or easy bruising Neurological:Denies numbness, tingling or new weaknesses  Behavioral/Psych: Mood is stable, no new changes  All other systems were reviewed with the patient and are negative.  PHYSICAL  EXAMINATION: ECOG PERFORMANCE STATUS: 1 - Symptomatic but completely ambulatory  Blood pressure 138/70, pulse 106, temperature 97.4 F (36.3 C), temperature source Oral, resp. rate 18, height $RemoveBe'5\' 2"'uaqZwzxrb$  (1.575 m), weight 234 lb 8 oz (106.369 kg), SpO2 94.00%.  GENERAL:alert, no distress and comfortable morbidly obese woman SKIN: skin color, texture, turgor are normal, no rashes or significant lesions EYES: normal, Conjunctiva are pink and non-injected, sclera clear OROPHARYNX:no exudate, no erythema and lips, buccal mucosa, and tongue normal  NECK: supple, thyroid normal size, non-tender, without nodularity LYMPH:  no palpable lymphadenopathy in the cervical, axillary LUNGS: coarse breathing with slightly increased breathing rate HEART: regular rate & rhythm and no murmurs and no lower extremity edema BREAST: deferred ABDOMEN:abdomen soft, non-tender and normal bowel sounds; obese Musculoskeletal:no cyanosis of digits and no clubbing  NEURO: alert & oriented x 3 with fluent speech, no focal motor/sensory deficits  LABORATORY DATA: CBC    Component Value Date/Time   WBC 7.2 12/30/2013 1133   WBC 6.8 12/26/2013 1615   RBC 4.23 12/30/2013 1133   RBC 4.31 12/26/2013 1615   RBC 3.18* 09/26/2012 0518   HGB 10.4* 12/30/2013 1133   HGB 10.9* 12/26/2013 1615   HCT 33.0* 12/30/2013 1133   HCT 33.7* 12/26/2013 1615   PLT 288 12/30/2013 1133   PLT 285 12/26/2013 1615   MCV 77.9* 12/30/2013 1133   MCV 78.2 12/26/2013 1615   MCH 24.7* 12/30/2013 1133   MCH 25.3* 12/26/2013 1615   MCHC 31.6 12/30/2013 1133   MCHC 32.3 12/26/2013 1615   RDW 17.9* 12/30/2013 1133   RDW 17.8* 12/26/2013 1615   LYMPHSABS 0.9 12/30/2013 1133   LYMPHSABS 1.1 07/17/2013 1435   MONOABS 0.5 12/30/2013 1133   MONOABS 0.6 07/17/2013 1435   EOSABS 0.2 12/30/2013 1133   EOSABS 0.1 07/17/2013 1435   BASOSABS 0.0 12/30/2013 1133   BASOSABS 0.0 07/17/2013 1435   CMP     Component Value Date/Time   NA 143 12/30/2013 1133   NA 140 12/26/2013 1615   K 4.0 12/30/2013  1133   K 4.0 12/26/2013 1615   CL 101 12/26/2013 1615   CL 101 02/11/2013 0950   CO2 27 12/30/2013 1133   CO2 26 12/26/2013 1615   GLUCOSE 148* 12/30/2013 1133   GLUCOSE 115* 12/26/2013 1615   GLUCOSE 143* 02/11/2013 0950   BUN 31.9* 12/30/2013 1133   BUN 23 12/26/2013 1615   CREATININE 1.5* 12/30/2013 1133   CREATININE 1.04 12/26/2013 1615   CREATININE 1.25* 03/30/2013 1558   CALCIUM 11.9* 12/30/2013 1133   CALCIUM 11.9* 12/26/2013 1615   CALCIUM 6.6* 09/29/2012 1118   PROT 6.5 12/30/2013 1133   PROT 6.6 04/04/2013 0056   ALBUMIN 3.4* 12/30/2013 1133   ALBUMIN 3.7 04/04/2013 0056   AST 23 12/30/2013 1133   AST 12 04/04/2013 0056   ALT 32 12/30/2013 1133   ALT 16 04/04/2013 0056   ALKPHOS 136 12/30/2013 1133   ALKPHOS 116 04/04/2013 0056   BILITOT 0.34 12/30/2013 1133   BILITOT 0.2* 04/04/2013 0056   GFRNONAA 55* 12/26/2013 1615   GFRAA 63* 12/26/2013 1615    RADIOGRAPHIC STUDIES: 12/30/2013 CT NECK WITHOUT CONTRAST TECHNIQUE: Multidetector CT imaging of the neck was performed following the standard protocol without intravenous contrast. COMPARISON: 06/24/2013 neck CT. 05/13/2013 chest CT. FINDINGS:  The visualized portion of the brain is grossly unremarkable. Orbits  are unremarkable. Paranasal sinuses and mastoid air cells are clear. Evaluation of the soft tissues of the neck is limited by lack of IV contrast. The nasopharynx, oropharynx, and oral cavity are grossly unremarkable. Slight nodularity along the anterior aspect of the right true vocal cord on the prior study is less conspicuous on the current examination. Asymmetry of the overlying right thyroid cartilage is unchanged. Asymmetry is again noted of the laryngeal ventricles, with the right being larger than the left. Diffusely heterogeneous thyroid gland is again noted. Submandibular and parotid glands are unremarkable.  Right level IV lymph node appears slightly larger than on the prior  study, measuring 11 x 8 mm (series 2, image 80 to, previously 9 x 6 mm upon remeasurement  on axial images). No new enlarged lymph nodes are identified. 5 mm right upper lobe ground-glass nodule is unchanged (series 4, image 51). 5 mm left upper lobe ground-glass nodule appears slightly larger than prior chest CT (series 4, image 50). Vertebral bodies are diffusely heterogeneous with areas of sclerosis as well as patchy lucency the lucent lesions have increased from the prior CT. For example, there is a 6 mm lesion in the base the dens which is  new or increased. Lesion anteriorly in the C3 vertebral body has  increased in size, and there also multiple new or larger lucent  lesions in the upper thoracic spine. Multilevel spondylosis is  present in the cervical spine. IMPRESSION: 1. Minimally increased size of a right level IV lymph node. 2. Slightly decreased conspicuity of right vocal cord nodularity  described on the prior study with unchanged asymmetry of the  overlying thyroid cartilage. 3. Slightly increased size of left upper lobe lung nodule. 4. Multiple lytic lesions in the visualized spine, new/increased from the prior study and concerning for progressive osseous metastatic disease.  12/31/2013 RIGHT HUMERUS - 2+ VIEW  COMPARISON: Right elbow radiographs 12/31/2013 FINDINGS:  There is an oblique complete fracture through the distal third of  the right humerus diaphysis. At the level of the fracture, there is  probable lucent lesion measuring approximately 2.2 cm diameter in the intact portion of the proximal side of the fracture. There is also discrete lucent lesion in the distal right clavicle. Possible lucent lesion in the right acromion. IMPRESSION: Acute fracture of the distal right humerus diaphysis. This is highly  suspicious for a pathologic fracture. Patient has known bony  metastatic disease secondary to breast cancer, per the electronic medical record. Lucent lesions in the distal clavicle and acromion are suspicious for metastatic disease.    ASSESSMENT: Metastatic  breast cancer on palliative letrozole, with progressive disease.   PLAN:  1. Acute right distal oblique fracture (12/31/2013). --Likely secondary to number #2. We will start zometa q 4 weeks (first dose today) to reduce further skeletal events.  Patient had declined previously.  She consented understanding indications, benefits and risks including but not limited to nephrotoxicity, hypocalcemia, osteoradionecrosis of the jaw.  She is scheduled to see orthopedics on Monday for evaluation and further management.   2.  Metastatic breast cancer (ER pos/PR pos/ HER2 neg) with spread to bone, lungs.  -Clinically, she has had a response with stable breast mass. However, her recent CT of Neck and arm x-rays show progressive bone lesions.  Further, her  CT of Chest showed a new lung nodule.  She is tolerating letrozole well without bone/muscle pain, hot flash/night sweat.  We will add palbociclib based on (Finn, 2015)  She will take 125 mg  daily for 21 days then 7 days off  With her letrozole daily.  We discussed extensively the indications, benefits and risks including but not limited to abnormal absolute lymphocyte count, neutropenia, leukopenia, thrombocytopenia, weakness, alopecia and fatigue and nausea.  She understood these risks and agree to proceed.   3. Stage III (T1N1M0) Laryngeal Carcinoma, probable.   --She has been seen by radiation oncology.  In the presence of Nurse Gerald Dexter, we reviewed her CT of neck as noted above and she agreed to further evaluation by Dr. Sondra Come.  She was referred today.    4. Anemia of chronic disease. -- Past work up for reversible causes of anemia were negative.   5. Grade 2 dystolic dysfunction. --She is euvolumic today on exam. She is on Lasix, nitrogylerin prn, losartan   6. Diabetes mellitus, type II. Diet controlled.   7. COPD: On albuterol prn. She remains on home oxygen.  8. Chronic renal insufficiency. Creatinine clearance of 42.3 mL/min today.    9. Follow  up: In about 1 month with CBC, CMP, CA 27 29. CBC every 2 weeks for the first couple months on palbociclib.      All questions were answered. The patient knows to call the clinic with any problems, questions or concerns. We can certainly see the patient much sooner if necessary.  I spent 25 minutes counseling the patient face to face. The total time spent in the appointment was 40 minutes.    [name], MD 01/03/2014 5:12 PM

## 2014-01-03 ENCOUNTER — Encounter (HOSPITAL_COMMUNITY): Payer: Self-pay | Admitting: Emergency Medicine

## 2014-01-03 ENCOUNTER — Emergency Department (HOSPITAL_COMMUNITY)
Admission: EM | Admit: 2014-01-03 | Discharge: 2014-01-03 | Disposition: A | Discharge status: Home / Self Care | Payer: Medicare HMO | Attending: Emergency Medicine | Admitting: Emergency Medicine

## 2014-01-03 DIAGNOSIS — Z87891 Personal history of nicotine dependence: Secondary | ICD-10-CM | POA: Insufficient documentation

## 2014-01-03 DIAGNOSIS — Z4789 Encounter for other orthopedic aftercare: Secondary | ICD-10-CM | POA: Insufficient documentation

## 2014-01-03 DIAGNOSIS — S42301A Unspecified fracture of shaft of humerus, right arm, initial encounter for closed fracture: Secondary | ICD-10-CM

## 2014-01-03 DIAGNOSIS — Z872 Personal history of diseases of the skin and subcutaneous tissue: Secondary | ICD-10-CM | POA: Diagnosis not present

## 2014-01-03 DIAGNOSIS — E119 Type 2 diabetes mellitus without complications: Secondary | ICD-10-CM | POA: Insufficient documentation

## 2014-01-03 DIAGNOSIS — N189 Chronic kidney disease, unspecified: Secondary | ICD-10-CM | POA: Diagnosis not present

## 2014-01-03 DIAGNOSIS — J449 Chronic obstructive pulmonary disease, unspecified: Secondary | ICD-10-CM | POA: Insufficient documentation

## 2014-01-03 DIAGNOSIS — Z7982 Long term (current) use of aspirin: Secondary | ICD-10-CM | POA: Diagnosis not present

## 2014-01-03 DIAGNOSIS — E785 Hyperlipidemia, unspecified: Secondary | ICD-10-CM | POA: Insufficient documentation

## 2014-01-03 DIAGNOSIS — I252 Old myocardial infarction: Secondary | ICD-10-CM | POA: Diagnosis not present

## 2014-01-03 DIAGNOSIS — D649 Anemia, unspecified: Secondary | ICD-10-CM | POA: Insufficient documentation

## 2014-01-03 DIAGNOSIS — Z79899 Other long term (current) drug therapy: Secondary | ICD-10-CM | POA: Insufficient documentation

## 2014-01-03 DIAGNOSIS — G8911 Acute pain due to trauma: Secondary | ICD-10-CM | POA: Insufficient documentation

## 2014-01-03 DIAGNOSIS — Z8742 Personal history of other diseases of the female genital tract: Secondary | ICD-10-CM | POA: Diagnosis not present

## 2014-01-03 DIAGNOSIS — Z853 Personal history of malignant neoplasm of breast: Secondary | ICD-10-CM | POA: Diagnosis not present

## 2014-01-03 DIAGNOSIS — M79609 Pain in unspecified limb: Secondary | ICD-10-CM | POA: Diagnosis present

## 2014-01-03 DIAGNOSIS — I5032 Chronic diastolic (congestive) heart failure: Secondary | ICD-10-CM | POA: Insufficient documentation

## 2014-01-03 DIAGNOSIS — J4489 Other specified chronic obstructive pulmonary disease: Secondary | ICD-10-CM | POA: Insufficient documentation

## 2014-01-03 NOTE — ED Notes (Signed)
Ortho tech at bedside applying splint, pt tolerated procedure well

## 2014-01-03 NOTE — ED Provider Notes (Signed)
CSN: [id number]     Arrival date & time 01/03/14  0815 History  This chart was scribed for non-physician practitioner, Antonietta Breach, PA-C working with Neta Ehlers, MD by Frederich Balding, ED scribe. This patient was seen in room TR07C/TR07C and the patient's care was started at 8:51 AM.   Chief Complaint  Patient presents with  . Arm Pain   The history is provided by the patient. No language interpreter was used.   HPI Comments: Anita [name] is a 68 y.o. female with history of bone cancer who presents to the Emergency Department complaining of continued, worsening right arm pain that started 3 days ago. States she was seen here 3 days ago, diagnosed with a humeral fracture and put in a splint. Pt is supposed to follow up with Dr. Berenice Primas tomorrow but states she has not called him yet. She states she needs her splint re-splinted because it came loose earlier this morning. Pt has been taking Vicodin with some relief of pain. Denies fever, numbness, tingling, worsening pain or swelling and trauma/injury.     Past Medical History  Diagnosis Date  . Fibroid   . Morbid obesity   . 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 COPD (chronic obstructive pulmonary disease)   . Chronic diastolic CHF (congestive heart failure)   . NSTEMI (non-ST elevated myocardial infarction)     Cath November 2014 LAD 40% stenosis, first diagonal 80% stenosis, circumflex 20% stenosis, right coronary artery occluded. The EF was 35-40% at that time.  . Anemia     a. Adm 09/2012 with melena, Hgb 5.8 -> transfused. EGD/colonoscopy unrevealing.  . Breast cancer     a. Mets to bone. ER 100%/ PR 0%/Her2 neu negative.  [name] Ulcers of both lower legs   . Tobacco abuse   . Hyperlipidemia 02/11/2013  . Diabetes mellitus without complication   . Chronic respiratory failure     a. On O2 qhs. also portable O2.  . Chronic renal insufficiency   . Lesion of vocal cord     a. CT 05/2013 concerning  for tumor.   Past Surgical History  Procedure Laterality Date  . Colonoscopy, esophagogastroduodenoscopy (egd) and esophageal dilation N/A 10/13/2012    Procedure: colonoscopy and egd;  Surgeon: Wonda Horner, MD;  Location: [facility];  Service: Endoscopy;  Laterality: N/A;   Family History  Problem Relation Age of Onset  . Cancer Mother     leukemia  . Hypertension Mother   . Diabetes Father   . Heart disease Father     passed away due to heart attack   History  Substance Use Topics  . Smoking status: Former Smoker -- 2.00 packs/day for 40 years    Types: Cigarettes    Quit date: 09/24/2012  . Smokeless tobacco: Never Used  . Alcohol Use: No   OB History   Grav Para Term Preterm Abortions TAB SAB Ect Mult Living   '2 2 2       2      '$ Review of Systems  Constitutional: Negative for fever.  Musculoskeletal: Positive for myalgias.  Neurological: Negative for numbness.  All other systems reviewed and are negative.   Allergies  Omnipaque and Benzene  Home Medications   Prior to Admission medications   Medication Sig Start Date End Date Taking? Authorizing Provider  albuterol (PROVENTIL HFA;VENTOLIN HFA) 108 (90 BASE) MCG/ACT inhaler Inhale 1 puff into the lungs 2 (  two) times daily as needed for wheezing or shortness of breath.   Yes Historical Provider, MD  aspirin EC 81 MG EC tablet Take 1 tablet (81 mg total) by mouth daily. 10/02/12  Yes Leone Haven, MD  atorvastatin (LIPITOR) 40 MG tablet Take 1 tablet (40 mg total) by mouth daily at 6 PM. 02/11/13  Yes Coral Spikes, DO  Calcium Citrate (CALCITRATE PO) Take 1 tablet by mouth daily.    Yes Historical Provider, MD  ferrous sulfate 325 (65 FE) MG EC tablet Take 1 tablet (325 mg total) by mouth daily with breakfast. 10/30/13  Yes [name], MD  furosemide (LASIX) 40 MG tablet Take 80 mg by mouth daily.  10/12/13  Yes Minus Breeding, MD  HYDROcodone-acetaminophen (NORCO/VICODIN) 5-325 MG per tablet Take 2 tablets by  mouth every 4 (four) hours as needed for moderate pain. 12/26/13  Yes Kaitlyn Szekalski, PA-C  isosorbide mononitrate (IMDUR) 60 MG 24 hr tablet Take 1 tablet (60 mg total) by mouth daily. 10/12/13  Yes Minus Breeding, MD  letrozole [facility]) 2.5 MG tablet Take 1 tablet (2.5 mg total) by mouth daily. 10/12/13  Yes [name], MD  losartan (COZAAR) 25 MG tablet Take 1 tablet (25 mg total) by mouth daily. 10/12/13  Yes Minus Breeding, MD  metFORMIN (GLUCOPHAGE) 500 MG tablet Take 1 tablet (500 mg total) by mouth 2 (two) times daily with a meal. 12/04/13  Yes Coral Spikes, DO  nitroGLYCERIN (NITROSTAT) 0.4 MG SL tablet Place 1 tablet (0.4 mg total) under the tongue every 5 (five) minutes as needed for chest pain. 03/17/13  Yes Cordelia Poche, MD  NON FORMULARY Place 3 L into the nose as needed (oxygen).    Yes Historical Provider, MD  palbociclib [facility]) 125 MG capsule Take whole with food. Please take one tablet daily for 21 days then off for 7 days. 01/01/14  Yes [name], MD  potassium chloride SA (K-DUR,KLOR-CON) 20 MEQ tablet Take 60 mEq by mouth daily. 2 tablets (40 mEq) AM and 1 tablet ( 20 mEq) PM 10/12/13  Yes Minus Breeding, MD  Umeclidinium-Vilanterol 62.5-25 MCG/INH AEPB Inhale 1 puff into the lungs as needed (for COPD). 08/12/13  Yes Chesley Mires, MD   BP 100/55  Pulse 92  Temp(Src) 97.9 F (36.6 C) (Oral)  Resp 17  Ht $R'5\' 2"'qI$  (1.575 m)  Wt 234 lb (106.142 kg)  BMI 42.79 kg/m2  SpO2 93%  Physical Exam  Nursing note and vitals reviewed. Constitutional: She is oriented to person, place, and time. She appears well-developed and well-nourished. No distress.  Non toxic. Not septic appearing.  HENT:  Head: Normocephalic and atraumatic.  Eyes: Conjunctivae and EOM are normal. No scleral icterus.  Neck: Normal range of motion.  Cardiovascular: Normal rate, regular rhythm and intact distal pulses.   Pulses:      Radial pulses are 2+ on the right side.  Pulmonary/Chest: Effort normal. No  respiratory distress.  Musculoskeletal: She exhibits tenderness.       Right upper arm: She exhibits tenderness. She exhibits no deformity.       Arms: Distal radial pulse 2+ in right upper extremity. Capillary refill normal in all digits of right hand. Mild ecchymosis to medial aspect of mid right upper extremity.  Neurological: She is alert and oriented to person, place, and time. She exhibits normal muscle tone. Coordination normal.  Sensation to light touch intact. Patient able to wiggle all fingers.  Skin: Skin is warm and dry. No  rash noted. She is not diaphoretic. No erythema. No pallor.  Psychiatric: She has a normal mood and affect. Her behavior is normal.    ED Course  Procedures (including critical care time)  DIAGNOSTIC STUDIES: Oxygen Saturation is 93% on RA, adequate by my interpretation.    COORDINATION OF CARE: 8:53 AM-Discussed treatment plan which includes re-splinting with pt at bedside and pt agreed to plan. Advised pt to call Dr. Berenice Primas tomorrow and follow up.   Labs Review Labs Reviewed - No data to display  Imaging Review Dg Humerus Right  12/31/2013 CLINICAL DATA: Right elbow and distal humerus pain. Heard pop when reaching for door. EXAM: RIGHT HUMERUS - 2+ VIEW COMPARISON: Right elbow radiographs 12/31/2013 FINDINGS: There is an oblique complete fracture through the distal third of the right humerus diaphysis. At the level of the fracture, there is probable lucent lesion measuring approximately 2.2 cm diameter in the intact portion of the proximal side of the fracture. There is also discrete lucent lesion in the distal right clavicle. Possible lucent lesion in the right acromion. IMPRESSION: Acute fracture of the distal right humerus diaphysis. This is highly suspicious for a pathologic fracture. Patient has known bony metastatic disease secondary to breast cancer, per the electronic medical record. Lucent lesions in the distal clavicle and acromion are suspicious for  metastatic disease. Electronically Signed By: Curlene Dolphin M.D. On: 12/31/2013 22:33  Dg Elbow Complete Right  12/31/2013 CLINICAL DATA: Right posterior elbow pain EXAM: RIGHT ELBOW - COMPLETE 3+ VIEW COMPARISON: None. FINDINGS: There is a lucent lesion in the proximal radial metaphysis concerning for metastatic disease versus benign fibro-osseous lesion. There is a narrow zone of transition. There is a pathologic nondisplaced fracture through the proximal anterior cortex. There is a oblique displaced and apex dorsally angulated distal humeral diaphysis fracture. There is no dislocation. IMPRESSION: 1. Lucent lesion in the proximal radial metaphysis concerning for metastatic disease versus benign fibro-osseous lesion, with a pathologic nondisplaced fracture through the proximal anterior cortex. 2. Oblique displaced and apex dorsally angulated distal humeral diaphysis fracture. Electronically Signed By: [name] On: 12/31/2013 22:10    EKG Interpretation None      MDM   Final diagnoses:  Fracture of humerus, right, closed  Aftercare for cast or splint check or change    68 year old female presents for re\re splinting of her right upper extremity. Patient was diagnosed with a pathologic humeral fracture 2 days ago. She has a history of breast cancer with metastases to the bone. Patient states her coaptation splint came apart this morning. No new trauma/injury. No worsening pain or sensation loss. No fevers.   Patient neurovascularly intact on exam. No gross sensory deficits appreciated. Coaptation splint reapplied by ortho tech in ED. Neurovascularly intact post splinting with good cap refill. Patient instructed to followup with Dr. Berenice Primas tomorrow. She has been told to continue taking Norco as needed for pain control. Return precautions provided and splint care instructions given. Patient agreeable to plan with no unaddressed concerns.  I personally performed the services described in this  documentation, which was scribed in my presence. The recorded information has been reviewed and is accurate.   Filed Vitals:   01/03/14 0820 01/03/14 0844 01/03/14 0936  BP: 100/55  110/61  Pulse: 92  94  Temp: 97.9 F (36.6 C)  98.8 F (37.1 C)  TempSrc: Oral  Oral  Resp: 17  14  Height:  $Remove'5\' 2"'ahasoRZ$  (1.575 m)   Weight: 234 lb (106.142 kg) 234 lb (106.142  kg)   SpO2: 93%  96%     Antonietta Breach, PA-C 01/03/14 [phone]

## 2014-01-03 NOTE — Progress Notes (Signed)
Orthopedic Tech Progress Note Patient Details:  Anita [name] [birth year] [id number] Reapplication of coaptation splint. Posterior splint added as well for extra support. Ortho Devices Type of Ortho Device: Coapt;Post (long arm) splint Ortho Device/Splint Location: RUE Ortho Device/Splint Interventions: Application   Asia R Thompson 01/03/2014, 9:31 AM

## 2014-01-03 NOTE — ED Notes (Signed)
Ortho tech notified regarding need for splint placement

## 2014-01-03 NOTE — ED Notes (Signed)
Pt in from home c/o R arm pain, requesting to have R arm rewrapped, states, "I have bone cancer & I was moving my arm back & forth & it broke. I need my arm rewrapped." A&O x4, follows commands, speaks in complete sentences

## 2014-01-03 NOTE — Discharge Instructions (Signed)
Maintain your splint until otherwise instructed by your orthopedist. Wear a sling to prevent splint instability and to promote comfort. Continue taking Norco as needed for pain control. Return if symptoms worsen.  Cast or Splint Care Casts and splints support injured limbs and keep bones from moving while they heal. It is important to care for your cast or splint at home.  HOME CARE INSTRUCTIONS  Keep the cast or splint uncovered during the drying period. It can take 24 to 48 hours to dry if it is made of plaster. A fiberglass cast will dry in less than 1 hour.  Do not rest the cast on anything harder than a pillow for the first 24 hours.  Do not put weight on your injured limb or apply pressure to the cast until your health care provider gives you permission.  Keep the cast or splint dry. Wet casts or splints can lose their shape and may not support the limb as well. A wet cast that has lost its shape can also create harmful pressure on your skin when it dries. Also, wet skin can become infected.  Cover the cast or splint with a plastic bag when bathing or when out in the rain or snow. If the cast is on the trunk of the body, take sponge baths until the cast is removed.  If your cast does become wet, dry it with a towel or a blow dryer on the cool setting only.  Keep your cast or splint clean. Soiled casts may be wiped with a moistened cloth.  Do not place any hard or soft foreign objects under your cast or splint, such as cotton, toilet paper, lotion, or powder.  Do not try to scratch the skin under the cast with any object. The object could get stuck inside the cast. Also, scratching could lead to an infection. If itching is a problem, use a blow dryer on a cool setting to relieve discomfort.  Do not trim or cut your cast or remove padding from inside of it.  Exercise all joints next to the injury that are not immobilized by the cast or splint. For example, if you have a long leg cast,  exercise the hip joint and toes. If you have an arm cast or splint, exercise the shoulder, elbow, thumb, and fingers.  Elevate your injured arm or leg on 1 or 2 pillows for the first 1 to 3 days to decrease swelling and pain.It is best if you can comfortably elevate your cast so it is higher than your heart. SEEK MEDICAL CARE IF:   Your cast or splint cracks.  Your cast or splint is too tight or too loose.  You have unbearable itching inside the cast.  Your cast becomes wet or develops a soft spot or area.  You have a bad smell coming from inside your cast.  You get an object stuck under your cast.  Your skin around the cast becomes red or raw.  You have new pain or worsening pain after the cast has been applied. SEEK IMMEDIATE MEDICAL CARE IF:   You have fluid leaking through the cast.  You are unable to move your fingers or toes.  You have discolored (blue or white), cool, painful, or very swollen fingers or toes beyond the cast.  You have tingling or numbness around the injured area.  You have severe pain or pressure under the cast.  You have any difficulty with your breathing or have shortness of breath.  You  have chest pain. Document Released: 08/10/2000 Document Revised: 06/03/2013 Document Reviewed: 02/19/2013 [facility] Patient Information 2014 [place].

## 2014-01-04 ENCOUNTER — Telehealth: Payer: Self-pay

## 2014-01-04 ENCOUNTER — Telehealth: Payer: Self-pay | Admitting: Family Medicine

## 2014-01-04 DIAGNOSIS — J9610 Chronic respiratory failure, unspecified whether with hypoxia or hypercapnia: Secondary | ICD-10-CM

## 2014-01-04 NOTE — Telephone Encounter (Signed)
Please advise.Thank you.Elaine Roanhorse S Elenie Coven  

## 2014-01-04 NOTE — Telephone Encounter (Signed)
Pt called and would like DR. Lacinda Axon to order her a home health nurse to come and help her. Please call [phone] to set this up. jw

## 2014-01-04 NOTE — ED Provider Notes (Signed)
Medical screening examination/treatment/procedure(s) were performed by non-physician practitioner and as supervising physician I was immediately available for consultation/collaboration.   EKG Interpretation None        Mervin Kung, MD 01/04/14 [phone]

## 2014-01-04 NOTE — Telephone Encounter (Signed)
returned pt call from earlier. She is stating her legs are weak and is there anything we can do. They are only painful when it rains. She is seeing orthopedist tomorrow about her arm and I suggested she ask him about her legs. I asked what is her question about radiation and she said karen talked with her. "I have to make a decision, but I don't know. I did not feel like talking to her earlier". Pt stated she would call Santiago Glad back. Pt hung up phone.

## 2014-01-04 NOTE — ED Provider Notes (Signed)
Medical screening examination/treatment/procedure(s) were performed by non-physician practitioner and as supervising physician I was immediately available for consultation/collaboration.   Neta Ehlers, MD 01/04/14 [phone]

## 2014-01-05 ENCOUNTER — Telehealth: Payer: Self-pay | Admitting: Internal Medicine

## 2014-01-05 NOTE — Telephone Encounter (Signed)
Called pt and left message regarding appt for May and June, mailed appt °

## 2014-01-06 ENCOUNTER — Other Ambulatory Visit: Payer: Self-pay | Admitting: Internal Medicine

## 2014-01-06 ENCOUNTER — Other Ambulatory Visit: Payer: Self-pay

## 2014-01-06 ENCOUNTER — Telehealth: Payer: Self-pay | Admitting: Family Medicine

## 2014-01-06 DIAGNOSIS — C7951 Secondary malignant neoplasm of bone: Principal | ICD-10-CM

## 2014-01-06 DIAGNOSIS — C50919 Malignant neoplasm of unspecified site of unspecified female breast: Secondary | ICD-10-CM

## 2014-01-06 MED ORDER — PALBOCICLIB 125 MG PO CAPS: ORAL_CAPSULE | ORAL | Status: DC

## 2014-01-06 NOTE — Telephone Encounter (Signed)
Will arrange with the help of SW

## 2014-01-06 NOTE — Telephone Encounter (Signed)
Referral for Home health placed.

## 2014-01-06 NOTE — Telephone Encounter (Signed)
Patient calling to request services to help her at home and to clean her house. Please advise.

## 2014-01-06 NOTE — Telephone Encounter (Signed)
Please advise.Thank you.Parris Signer S Randee Huston  

## 2014-01-06 NOTE — Telephone Encounter (Signed)
Will discuss potential services with SW.

## 2014-01-07 ENCOUNTER — Telehealth: Payer: Self-pay

## 2014-01-07 NOTE — Telephone Encounter (Signed)
Pt called earlier that her legs were killing her and her energy level was 0. She stated she would get some gatorade for electrolytes. lvm to take up to 2 norco every 4 hours maximum. To drink plenty of water. Call if need anything.

## 2014-01-07 NOTE — Telephone Encounter (Signed)
I will contact the patient about doing a PCS form. This is not an immediate service - if she qualifies it can take a few weeks to get personal care services.  Hunt Oris, MSW, [place]

## 2014-01-07 NOTE — Telephone Encounter (Signed)
Fax RX for [employer] to [employer]

## 2014-01-08 ENCOUNTER — Telehealth: Payer: Self-pay

## 2014-01-08 ENCOUNTER — Telehealth: Payer: Self-pay | Admitting: Cardiology

## 2014-01-08 ENCOUNTER — Other Ambulatory Visit: Payer: Self-pay | Admitting: Internal Medicine

## 2014-01-08 DIAGNOSIS — C7951 Secondary malignant neoplasm of bone: Principal | ICD-10-CM

## 2014-01-08 DIAGNOSIS — C50919 Malignant neoplasm of unspecified site of unspecified female breast: Secondary | ICD-10-CM

## 2014-01-08 NOTE — Telephone Encounter (Signed)
CSW left a message for pt regarding assistance with getting a ramp built. CSW provided the following contact numbers:   [facility] [place]  [phone]  [facility] [phone]  Having a ramp built is not covered by insurance so if either of these agencies are not able to provide the service then the pt will have to pay privately and hire someone. CSW also working on [employer] form and will place in PCP box for signature.  Hunt Oris, MSW, [place]

## 2014-01-08 NOTE — Telephone Encounter (Signed)
Pt calling stating she needs a caregiver, someone to clean her house and a ramp.  She is requesting orders be sent to [facility] at 1 [phone] (fax#).  Advised generally her PCP would order home health care and not cardiology.  She request I sent this information to Renato Gails and Dr [name].  I advised I will forward the information and request to them.  Unable to locate anyone named Renato Gails in the [facility] system.  Will forward request to her PCP.

## 2014-01-08 NOTE — Telephone Encounter (Signed)
Patient now request a ramp. She states she cannot do the steps any longer.

## 2014-01-08 NOTE — Telephone Encounter (Signed)
New message    Patient calling will explain to nurse when she called.

## 2014-01-08 NOTE — Telephone Encounter (Signed)
Pt called asking about getting a ramp built to get into house. States she had to call EMS twice to help her into the house. Ask for home health nurse. Asked for someone to help with house cleaning. S/w Dr Juliann Mule and he ordered home health aid and [place] and they do not cover ramp construction. Later LVM for pt with these measures taken.

## 2014-01-08 NOTE — Telephone Encounter (Signed)
Left message to call back  

## 2014-01-08 NOTE — Telephone Encounter (Signed)
S/w pt about message left earlier. She had not received it.

## 2014-01-09 ENCOUNTER — Emergency Department (HOSPITAL_COMMUNITY): Payer: Medicare HMO

## 2014-01-09 ENCOUNTER — Telehealth: Payer: Self-pay | Admitting: Family Medicine

## 2014-01-09 ENCOUNTER — Encounter (HOSPITAL_COMMUNITY): Payer: Self-pay | Admitting: Emergency Medicine

## 2014-01-09 ENCOUNTER — Emergency Department (HOSPITAL_COMMUNITY)
Admission: EM | Admit: 2014-01-09 | Discharge: 2014-01-09 | Disposition: A | Discharge status: Home / Self Care | Payer: Medicare HMO | Attending: Emergency Medicine | Admitting: Emergency Medicine

## 2014-01-09 DIAGNOSIS — Z79899 Other long term (current) drug therapy: Secondary | ICD-10-CM | POA: Insufficient documentation

## 2014-01-09 DIAGNOSIS — Z87891 Personal history of nicotine dependence: Secondary | ICD-10-CM | POA: Insufficient documentation

## 2014-01-09 DIAGNOSIS — J449 Chronic obstructive pulmonary disease, unspecified: Secondary | ICD-10-CM | POA: Insufficient documentation

## 2014-01-09 DIAGNOSIS — Z7982 Long term (current) use of aspirin: Secondary | ICD-10-CM | POA: Insufficient documentation

## 2014-01-09 DIAGNOSIS — D649 Anemia, unspecified: Secondary | ICD-10-CM | POA: Insufficient documentation

## 2014-01-09 DIAGNOSIS — J9610 Chronic respiratory failure, unspecified whether with hypoxia or hypercapnia: Secondary | ICD-10-CM | POA: Insufficient documentation

## 2014-01-09 DIAGNOSIS — I5032 Chronic diastolic (congestive) heart failure: Secondary | ICD-10-CM | POA: Insufficient documentation

## 2014-01-09 DIAGNOSIS — Z4789 Encounter for other orthopedic aftercare: Secondary | ICD-10-CM | POA: Insufficient documentation

## 2014-01-09 DIAGNOSIS — E119 Type 2 diabetes mellitus without complications: Secondary | ICD-10-CM | POA: Insufficient documentation

## 2014-01-09 DIAGNOSIS — R609 Edema, unspecified: Secondary | ICD-10-CM | POA: Insufficient documentation

## 2014-01-09 DIAGNOSIS — Z8742 Personal history of other diseases of the female genital tract: Secondary | ICD-10-CM | POA: Insufficient documentation

## 2014-01-09 DIAGNOSIS — Z853 Personal history of malignant neoplasm of breast: Secondary | ICD-10-CM | POA: Insufficient documentation

## 2014-01-09 DIAGNOSIS — E785 Hyperlipidemia, unspecified: Secondary | ICD-10-CM | POA: Insufficient documentation

## 2014-01-09 DIAGNOSIS — J4489 Other specified chronic obstructive pulmonary disease: Secondary | ICD-10-CM | POA: Insufficient documentation

## 2014-01-09 DIAGNOSIS — Z872 Personal history of diseases of the skin and subcutaneous tissue: Secondary | ICD-10-CM | POA: Insufficient documentation

## 2014-01-09 DIAGNOSIS — Z85830 Personal history of malignant neoplasm of bone: Secondary | ICD-10-CM | POA: Insufficient documentation

## 2014-01-09 DIAGNOSIS — I252 Old myocardial infarction: Secondary | ICD-10-CM | POA: Insufficient documentation

## 2014-01-09 DIAGNOSIS — N189 Chronic kidney disease, unspecified: Secondary | ICD-10-CM | POA: Insufficient documentation

## 2014-01-09 NOTE — ED Notes (Signed)
Pt ambulating independently w/ steady gait on d/c in no acute distress, A&Ox4. D/c instructions reviewed w/ pt - pt denies any further questions or concerns at present.  

## 2014-01-09 NOTE — ED Notes (Signed)
Pt reports rt arm splint is unraveling, pt w/ splint in place d/t pathological fracture from bone CA pt sustained while closing a door. Pt also c/o recent bilat LE edema - pt w/ 2+ pitting edema, admits to extertional SOB. Pt in no acute distress at present, A&Ox4 - ambulates independently w/ a cane.

## 2014-01-09 NOTE — ED Notes (Signed)
Per pt sts her cast started coming loose last night. sts she came here to have is re wrapped. Per pt sts also that her feet are swelling.

## 2014-01-09 NOTE — ED Notes (Signed)
Ortho notified of need for re-splinting.

## 2014-01-09 NOTE — Discharge Instructions (Signed)
Call for a follow up appointment with a Family or Primary Care Provider.  °Return if Symptoms worsen.   °Take medication as prescribed.  ° °

## 2014-01-09 NOTE — Telephone Encounter (Signed)
Emergency Line / After Hours Call  Pt called the emergency line. States she has bone cancer and that her legs are "killing her". They are swollen and painful. She states she just went to the emergency room for this today and that they sent her home. She says she is unable to get into her bed because of pain in her arm (recently dx'd with pathologic fracture). Is able to get up and walk around. She says that she would like for me to send Dr. Lacinda Axon a message letting him know that she "needs to be admitted" to the hospital. Informed her that I am the physician on call, and that if she were to require admission to the hospital that I would be the one to admit her, but that we do not admit patients from home. Advised that if she feels she needs to be admitted that she needs to be evaluated in the ER. Pt understood these instructions.  Chrisandra Netters, MD Family Medicine PGY-2

## 2014-01-09 NOTE — ED Provider Notes (Signed)
Medical screening examination/treatment/procedure(s) were conducted as a shared visit with non-physician practitioner(s) and myself.  I personally evaluated the patient during the encounter.   EKG Interpretation None     Patient with previous history of metastatic breast cancer and pathologic fracture of the right arm presents to the ER because the splint is falling off. Patient has normal distal neurologic function and blood flow on exam. Splint was Replaced.  Nurses notes indicate that the patient stated that she has swelling of her legs. Further discussion with the patient, however, reveals that she has had unchanged amount of swelling in her legs for approximately a year. She is not short of breath or in any respiratory distress. Lungs are clear on auscultation. A chest x-ray was ordered and this does show evidence of overload. Patient can followup as an outpatient at the primary care clinic this week for recheck.  Orpah Greek, MD 01/09/14 [phone]

## 2014-01-09 NOTE — ED Notes (Signed)
Patient transported to X-ray 

## 2014-01-09 NOTE — ED Provider Notes (Signed)
CSN: [id number]     Arrival date & time 01/09/14  0930 History   First MD Initiated Contact with Patient 01/09/14 1107     Chief Complaint  Patient presents with  . Arm Injury     (Consider location/radiation/quality/duration/timing/severity/associated sxs/prior Treatment) HPI Comments: The patient is a 68 year old female with past medical history of metastatic breast cancer to bone presenting to the emergency department chief complaint of right arm brace loosening. Patient reports gradual loosening of the Ace wrap for several days. She is followed by Dr. Berenice Primas, orthopedist.  She reports similar complaints 01/03/2014. The patient also reports bilateral extremity edema. She reports this is a chronic issue and has been compliant with her Lasix therapy. She denies shortness of breath or chest pain. PCP: [name], DO  Patient is a 68 y.o. female presenting with arm injury. The history is provided by the patient and medical records. No language interpreter was used.  Arm Injury Associated symptoms: no fever     Past Medical History  Diagnosis Date  . Fibroid   . Morbid obesity   . 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 COPD (chronic obstructive pulmonary disease)   . Chronic diastolic CHF (congestive heart failure)   . NSTEMI (non-ST elevated myocardial infarction)     Cath November 2014 LAD 40% stenosis, first diagonal 80% stenosis, circumflex 20% stenosis, right coronary artery occluded. The EF was 35-40% at that time.  . Anemia     a. Adm 09/2012 with melena, Hgb 5.8 -> transfused. EGD/colonoscopy unrevealing.  . Breast cancer     a. Mets to bone. ER 100%/ PR 0%/Her2 neu negative.  [name] Ulcers of both lower legs   . Tobacco abuse   . Hyperlipidemia 02/11/2013  . Diabetes mellitus without complication   . Chronic respiratory failure     a. On O2 qhs. also portable O2.  . Chronic renal insufficiency   . Lesion of vocal cord     a. CT 05/2013  concerning for tumor.   Past Surgical History  Procedure Laterality Date  . Colonoscopy, esophagogastroduodenoscopy (egd) and esophageal dilation N/A 10/13/2012    Procedure: colonoscopy and egd;  Surgeon: Wonda Horner, MD;  Location: [facility];  Service: Endoscopy;  Laterality: N/A;   Family History  Problem Relation Age of Onset  . Cancer Mother     leukemia  . Hypertension Mother   . Diabetes Father   . Heart disease Father     passed away due to heart attack   History  Substance Use Topics  . Smoking status: Former Smoker -- 2.00 packs/day for 40 years    Types: Cigarettes    Quit date: 09/24/2012  . Smokeless tobacco: Never Used  . Alcohol Use: No   OB History   Grav Para Term Preterm Abortions TAB SAB Ect Mult Living   '2 2 2       2     '$ Review of Systems  Constitutional: Negative for fever and chills.  Respiratory: Negative for shortness of breath.   Cardiovascular: Positive for leg swelling. Negative for chest pain.  Musculoskeletal: Positive for arthralgias.  All other systems reviewed and are negative.     Allergies  Omnipaque and Benzene  Home Medications   Prior to Admission medications   Medication Sig Start Date End Date Taking? Authorizing Provider  albuterol (PROVENTIL HFA;VENTOLIN HFA) 108 (90 BASE) MCG/ACT inhaler Inhale 1 puff into the lungs 2 (  two) times daily as needed for wheezing or shortness of breath.    Historical Provider, MD  aspirin EC 81 MG EC tablet Take 1 tablet (81 mg total) by mouth daily. 10/02/12   Glori Luis, MD  atorvastatin (LIPITOR) 40 MG tablet Take 1 tablet (40 mg total) by mouth daily at 6 PM. 02/11/13   Tommie Sams, DO  Calcium Citrate (CALCITRATE PO) Take 1 tablet by mouth daily.     Historical Provider, MD  ferrous sulfate 325 (65 FE) MG EC tablet Take 1 tablet (325 mg total) by mouth daily with breakfast. 10/30/13   Myra Rude, MD  furosemide (LASIX) 40 MG tablet Take 80 mg by mouth daily.  10/12/13   Rollene Rotunda, MD  HYDROcodone-acetaminophen (NORCO/VICODIN) 5-325 MG per tablet Take 2 tablets by mouth every 4 (four) hours as needed for moderate pain. 12/26/13   Kaitlyn Szekalski, PA-C  isosorbide mononitrate (IMDUR) 60 MG 24 hr tablet Take 1 tablet (60 mg total) by mouth daily. 10/12/13   Rollene Rotunda, MD  letrozole [facility]) 2.5 MG tablet Take 1 tablet (2.5 mg total) by mouth daily. 10/12/13   Myra Rude, MD  losartan (COZAAR) 25 MG tablet Take 1 tablet (25 mg total) by mouth daily. 10/12/13   Rollene Rotunda, MD  metFORMIN (GLUCOPHAGE) 500 MG tablet Take 1 tablet (500 mg total) by mouth 2 (two) times daily with a meal. 12/04/13   Tommie Sams, DO  nitroGLYCERIN (NITROSTAT) 0.4 MG SL tablet Place 1 tablet (0.4 mg total) under the tongue every 5 (five) minutes as needed for chest pain. 03/17/13   Jacquelin Hawking, MD  NON FORMULARY Place 3 L into the nose as needed (oxygen).     Historical Provider, MD  palbociclib I[facility]a Foil) 125 MG capsule Take whole with food. Please take one tablet daily for 21 days then off for 7 days. 01/06/14   Myra Rude, MD  potassium chloride SA (K-DUR,KLOR-CON) 20 MEQ tablet Take 60 mEq by mouth daily. 2 tablets (40 mEq) AM and 1 tablet ( 20 mEq) PM 10/12/13   Rollene Rotunda, MD  Umeclidinium-Vilanterol 62.5-25 MCG/INH AEPB Inhale 1 puff into the lungs as needed (for COPD). 08/12/13   Coralyn Helling, MD   BP 116/83  Pulse 76  Temp(Src) 98.8 F (37.1 C) (Oral)  Resp 16  Ht 5\' 2"  (1.575 m)  Wt 234 lb (106.142 kg)  BMI 42.79 kg/m2  SpO2 95% Physical Exam  Nursing note and vitals reviewed. Constitutional: She is oriented to person, place, and time. She appears well-developed and well-nourished. No distress.  HENT:  Head: Normocephalic and atraumatic.  Eyes: EOM are normal. Pupils are equal, round, and reactive to light. No scleral icterus.  Neck: Neck supple.  Cardiovascular: Normal rate, regular rhythm and normal heart sounds.   No murmur heard. 1+ Pitting edema equally  bilaterally  Pulmonary/Chest: Effort normal and breath sounds normal. No respiratory distress. She has no wheezes. She has no rales.  Abdominal: Soft. Bowel sounds are normal.  Musculoskeletal: Normal range of motion. She exhibits no edema.  Ace wrap loosely pulling right arm splint in place. Associated erythema to the humerus.  Neurological: She is alert and oriented to person, place, and time.  Skin: Skin is warm and dry. No rash noted. She is not diaphoretic.  Psychiatric: She has a normal mood and affect. Her behavior is normal.    ED Course  Procedures (including critical care time) Labs Review Labs Reviewed - No data to display  Imaging Review Dg Chest 2 View  01/09/2014   CLINICAL DATA:  Arm injury.  EXAM: CHEST  2 VIEW  COMPARISON:  07/06/2013  FINDINGS: There is chronic cardiomegaly. Unchanged mediastinal contours. No edema, consolidation, effusion, or pneumothorax. Evidence of bony metastatic disease with lytic lesions in the right acromion, right clavicle, surgical neck of the left humerus, anterior left second rib, posterior left seventh rib, distal right humerus, and left glenoid.  IMPRESSION: 1. No active cardiopulmonary disease. 2. Multi focal osseous metastatic disease.   Electronically Signed   By: [name] M.D.   On: 01/09/2014 13:10     EKG Interpretation None      MDM   Final diagnoses:  Aftercare for cast or splint check or change  Chronic edema   Patient presents for rewrapping of right upper extremity brace. Recently seen for pathologic fracture of right humerus. [facility] called. We'll obtain x-ray to evaluate for worsening edema. 03/14/2013 ECHO EF 75-64%, grade 1 diastolic dysfunction. No BNP on medical record. XR negative for evidence of CHF. Patient is stable for discharge at this time.       Lorrine Kin, PA-C 01/09/14 [phone]

## 2014-01-10 ENCOUNTER — Encounter (HOSPITAL_COMMUNITY): Payer: Self-pay | Admitting: Emergency Medicine

## 2014-01-10 ENCOUNTER — Observation Stay (HOSPITAL_COMMUNITY)
Admission: EM | Admit: 2014-01-10 | Discharge: 2014-01-13 | Disposition: A | Discharge status: Skilled Nursing Facility | Payer: Medicare HMO | Attending: Family Medicine | Admitting: Family Medicine

## 2014-01-10 ENCOUNTER — Telehealth: Payer: Self-pay | Admitting: Family Medicine

## 2014-01-10 DIAGNOSIS — S42301A Unspecified fracture of shaft of humerus, right arm, initial encounter for closed fracture: Secondary | ICD-10-CM | POA: Diagnosis present

## 2014-01-10 DIAGNOSIS — R5383 Other fatigue: Principal | ICD-10-CM

## 2014-01-10 DIAGNOSIS — I5042 Chronic combined systolic (congestive) and diastolic (congestive) heart failure: Secondary | ICD-10-CM | POA: Insufficient documentation

## 2014-01-10 DIAGNOSIS — Z79899 Other long term (current) drug therapy: Secondary | ICD-10-CM | POA: Diagnosis not present

## 2014-01-10 DIAGNOSIS — E876 Hypokalemia: Secondary | ICD-10-CM | POA: Insufficient documentation

## 2014-01-10 DIAGNOSIS — M84429D Pathological fracture, unspecified humerus, subsequent encounter for fracture with routine healing: Secondary | ICD-10-CM | POA: Insufficient documentation

## 2014-01-10 DIAGNOSIS — C329 Malignant neoplasm of larynx, unspecified: Secondary | ICD-10-CM | POA: Diagnosis not present

## 2014-01-10 DIAGNOSIS — N184 Chronic kidney disease, stage 4 (severe): Secondary | ICD-10-CM | POA: Diagnosis present

## 2014-01-10 DIAGNOSIS — C7951 Secondary malignant neoplasm of bone: Secondary | ICD-10-CM | POA: Diagnosis not present

## 2014-01-10 DIAGNOSIS — Z7982 Long term (current) use of aspirin: Secondary | ICD-10-CM | POA: Diagnosis not present

## 2014-01-10 DIAGNOSIS — J449 Chronic obstructive pulmonary disease, unspecified: Secondary | ICD-10-CM | POA: Diagnosis present

## 2014-01-10 DIAGNOSIS — I252 Old myocardial infarction: Secondary | ICD-10-CM | POA: Diagnosis not present

## 2014-01-10 DIAGNOSIS — R5381 Other malaise: Principal | ICD-10-CM | POA: Insufficient documentation

## 2014-01-10 DIAGNOSIS — N183 Chronic kidney disease, stage 3 unspecified: Secondary | ICD-10-CM | POA: Insufficient documentation

## 2014-01-10 DIAGNOSIS — I5032 Chronic diastolic (congestive) heart failure: Secondary | ICD-10-CM | POA: Diagnosis present

## 2014-01-10 DIAGNOSIS — D638 Anemia in other chronic diseases classified elsewhere: Secondary | ICD-10-CM | POA: Insufficient documentation

## 2014-01-10 DIAGNOSIS — C50919 Malignant neoplasm of unspecified site of unspecified female breast: Secondary | ICD-10-CM

## 2014-01-10 DIAGNOSIS — E860 Dehydration: Secondary | ICD-10-CM | POA: Diagnosis not present

## 2014-01-10 DIAGNOSIS — R630 Anorexia: Secondary | ICD-10-CM | POA: Diagnosis not present

## 2014-01-10 DIAGNOSIS — Z515 Encounter for palliative care: Secondary | ICD-10-CM | POA: Diagnosis not present

## 2014-01-10 DIAGNOSIS — I129 Hypertensive chronic kidney disease with stage 1 through stage 4 chronic kidney disease, or unspecified chronic kidney disease: Secondary | ICD-10-CM | POA: Diagnosis not present

## 2014-01-10 DIAGNOSIS — R627 Adult failure to thrive: Secondary | ICD-10-CM | POA: Diagnosis present

## 2014-01-10 DIAGNOSIS — N179 Acute kidney failure, unspecified: Secondary | ICD-10-CM | POA: Insufficient documentation

## 2014-01-10 DIAGNOSIS — E785 Hyperlipidemia, unspecified: Secondary | ICD-10-CM

## 2014-01-10 DIAGNOSIS — I509 Heart failure, unspecified: Secondary | ICD-10-CM | POA: Diagnosis not present

## 2014-01-10 DIAGNOSIS — I2510 Atherosclerotic heart disease of native coronary artery without angina pectoris: Secondary | ICD-10-CM | POA: Diagnosis not present

## 2014-01-10 DIAGNOSIS — Z9981 Dependence on supplemental oxygen: Secondary | ICD-10-CM | POA: Insufficient documentation

## 2014-01-10 DIAGNOSIS — J4489 Other specified chronic obstructive pulmonary disease: Secondary | ICD-10-CM | POA: Insufficient documentation

## 2014-01-10 DIAGNOSIS — J9610 Chronic respiratory failure, unspecified whether with hypoxia or hypercapnia: Secondary | ICD-10-CM | POA: Diagnosis not present

## 2014-01-10 DIAGNOSIS — C7952 Secondary malignant neoplasm of bone marrow: Secondary | ICD-10-CM

## 2014-01-10 DIAGNOSIS — Z6841 Body Mass Index (BMI) 40.0 and over, adult: Secondary | ICD-10-CM | POA: Insufficient documentation

## 2014-01-10 DIAGNOSIS — E119 Type 2 diabetes mellitus without complications: Secondary | ICD-10-CM | POA: Diagnosis not present

## 2014-01-10 DIAGNOSIS — R531 Weakness: Secondary | ICD-10-CM | POA: Diagnosis present

## 2014-01-10 DIAGNOSIS — Z87891 Personal history of nicotine dependence: Secondary | ICD-10-CM | POA: Diagnosis not present

## 2014-01-10 LAB — COMPREHENSIVE METABOLIC PANEL
ALT: 24 U/L (ref 0–35)
AST: 18 U/L (ref 0–37)
Albumin: 3.5 g/dL (ref 3.5–5.2)
Alkaline Phosphatase: 147 U/L — ABNORMAL HIGH (ref 39–117)
BUN: 31 mg/dL — ABNORMAL HIGH (ref 6–23)
CALCIUM: 8 mg/dL — AB (ref 8.4–10.5)
CO2: 25 mEq/L (ref 19–32)
Chloride: 102 mEq/L (ref 96–112)
Creatinine, Ser: 1.24 mg/dL — ABNORMAL HIGH (ref 0.50–1.10)
GFR calc Af Amer: 51 mL/min — ABNORMAL LOW (ref >90)
GFR, EST NON AFRICAN AMERICAN: 44 mL/min — AB (ref >90)
Glucose, Bld: 136 mg/dL — ABNORMAL HIGH (ref 70–99)
Potassium: 3.3 mEq/L — ABNORMAL LOW (ref 3.7–5.3)
SODIUM: 140 meq/L (ref 137–147)
Total Bilirubin: 0.4 mg/dL (ref 0.3–1.2)
Total Protein: 7 g/dL (ref 6.0–8.3)

## 2014-01-10 LAB — CBC WITH DIFFERENTIAL/PLATELET
Basophils Absolute: 0 10*3/uL (ref 0.0–0.1)
Basophils Relative: 0 % (ref 0–1)
EOS PCT: 1 % (ref 0–5)
Eosinophils Absolute: 0.1 10*3/uL (ref 0.0–0.7)
HEMATOCRIT: 31 % — AB (ref 36.0–46.0)
HEMOGLOBIN: 10.2 g/dL — AB (ref 12.0–15.0)
LYMPHS ABS: 0.7 10*3/uL (ref 0.7–4.0)
Lymphocytes Relative: 10 % — ABNORMAL LOW (ref 12–46)
MCH: 25.4 pg — ABNORMAL LOW (ref 26.0–34.0)
MCHC: 32.9 g/dL (ref 30.0–36.0)
MCV: 77.1 fL — AB (ref 78.0–100.0)
MONO ABS: 0.6 10*3/uL (ref 0.1–1.0)
Monocytes Relative: 8 % (ref 3–12)
Neutro Abs: 6 10*3/uL (ref 1.7–7.7)
Neutrophils Relative %: 81 % — ABNORMAL HIGH (ref 43–77)
PLATELETS: 343 10*3/uL (ref 150–400)
RBC: 4.02 MIL/uL (ref 3.87–5.11)
RDW: 17.8 % — ABNORMAL HIGH (ref 11.5–15.5)
WBC: 7.3 10*3/uL (ref 4.0–10.5)

## 2014-01-10 LAB — MAGNESIUM: Magnesium: 2 mg/dL (ref 1.5–2.5)

## 2014-01-10 LAB — PRO B NATRIURETIC PEPTIDE: Pro B Natriuretic peptide (BNP): 379.4 pg/mL — ABNORMAL HIGH (ref 0–125)

## 2014-01-10 LAB — GLUCOSE, CAPILLARY: GLUCOSE-CAPILLARY: 119 mg/dL — AB (ref 70–99)

## 2014-01-10 MED ORDER — ATORVASTATIN CALCIUM 40 MG PO TABS
40.0000 mg | ORAL_TABLET | Freq: Every day | ORAL | Status: DC
  Administered 2014-01-11 – 2014-01-12 (×2): 40 mg via ORAL
  Filled 2014-01-10 (×3): qty 1

## 2014-01-10 MED ORDER — NITROGLYCERIN 0.4 MG SL SUBL: 0.4000 mg | SUBLINGUAL_TABLET | SUBLINGUAL | Status: DC | PRN

## 2014-01-10 MED ORDER — ISOSORBIDE MONONITRATE ER 60 MG PO TB24
60.0000 mg | ORAL_TABLET | Freq: Every day | ORAL | Status: DC
  Administered 2014-01-11 – 2014-01-12 (×2): 60 mg via ORAL
  Filled 2014-01-10 (×2): qty 1

## 2014-01-10 MED ORDER — FUROSEMIDE 80 MG PO TABS
80.0000 mg | ORAL_TABLET | Freq: Every day | ORAL | Status: DC
  Administered 2014-01-10 – 2014-01-11 (×2): 80 mg via ORAL
  Filled 2014-01-10 (×3): qty 1

## 2014-01-10 MED ORDER — LOSARTAN POTASSIUM 25 MG PO TABS
25.0000 mg | ORAL_TABLET | Freq: Every day | ORAL | Status: DC
  Administered 2014-01-11: 25 mg via ORAL
  Filled 2014-01-10 (×2): qty 1

## 2014-01-10 MED ORDER — ALBUTEROL SULFATE HFA 108 (90 BASE) MCG/ACT IN AERS: 1.0000 | INHALATION_SPRAY | Freq: Two times a day (BID) | RESPIRATORY_TRACT | Status: DC | PRN

## 2014-01-10 MED ORDER — UMECLIDINIUM-VILANTEROL 62.5-25 MCG/INH IN AEPB: 1.0000 | INHALATION_SPRAY | RESPIRATORY_TRACT | Status: DC | PRN

## 2014-01-10 MED ORDER — POTASSIUM CHLORIDE CRYS ER 20 MEQ PO TBCR
20.0000 meq | EXTENDED_RELEASE_TABLET | Freq: Two times a day (BID) | ORAL | Status: AC
  Administered 2014-01-10 – 2014-01-12 (×4): 20 meq via ORAL
  Filled 2014-01-10 (×4): qty 1

## 2014-01-10 MED ORDER — PALBOCICLIB 125 MG PO CAPS: 125.0000 mg | ORAL_CAPSULE | Freq: Every day | ORAL | Status: DC

## 2014-01-10 MED ORDER — ALBUTEROL SULFATE (2.5 MG/3ML) 0.083% IN NEBU: 2.5000 mg | INHALATION_SOLUTION | Freq: Two times a day (BID) | RESPIRATORY_TRACT | Status: DC | PRN

## 2014-01-10 MED ORDER — LETROZOLE 2.5 MG PO TABS
2.5000 mg | ORAL_TABLET | Freq: Every day | ORAL | Status: DC
  Administered 2014-01-11 – 2014-01-13 (×3): 2.5 mg via ORAL
  Filled 2014-01-10 (×3): qty 1

## 2014-01-10 MED ORDER — ASPIRIN EC 81 MG PO TBEC
81.0000 mg | DELAYED_RELEASE_TABLET | Freq: Every day | ORAL | Status: DC
  Administered 2014-01-11 – 2014-01-13 (×3): 81 mg via ORAL
  Filled 2014-01-10 (×3): qty 1

## 2014-01-10 MED ORDER — HEPARIN SODIUM (PORCINE) 5000 UNIT/ML IJ SOLN
5000.0000 [IU] | Freq: Three times a day (TID) | INTRAMUSCULAR | Status: DC
  Administered 2014-01-10 – 2014-01-12 (×3): 5000 [IU] via SUBCUTANEOUS
  Filled 2014-01-10 (×11): qty 1

## 2014-01-10 MED ORDER — INSULIN ASPART 100 UNIT/ML ~~LOC~~ SOLN
0.0000 [IU] | Freq: Three times a day (TID) | SUBCUTANEOUS | Status: DC
  Administered 2014-01-12 – 2014-01-13 (×3): 1 [IU] via SUBCUTANEOUS

## 2014-01-10 MED ORDER — HYDROCODONE-ACETAMINOPHEN 5-325 MG PO TABS
2.0000 | ORAL_TABLET | ORAL | Status: DC | PRN
  Administered 2014-01-10 – 2014-01-13 (×9): 2 via ORAL
  Filled 2014-01-10 (×11): qty 2

## 2014-01-10 NOTE — ED Provider Notes (Signed)
CSN: [id number]     Arrival date & time 01/10/14  1255 History   First MD Initiated Contact with Patient 01/10/14 1310     Chief Complaint  Patient presents with  . Failure To Thrive     (Consider location/radiation/quality/duration/timing/severity/associated sxs/prior Treatment) HPI 68 year old female presents from home saying she needs to be admitted because he cannot take care of her self. She states that she broke her right arm when a door fell on it 10 days ago. Since then she's been having trouble opening things and serving her self food. She is right-handed. She's had to call EMS multiple times because of concerns about her right arm splint and sling. She was seen in the [facility] yesterday for cast issues. Family apparently works during the day and feels unsafe at home she is alone. Her husband is home today and was able to make her food but otherwise she's not been able to eat very well. She has also been having leg swelling over the last week or so. This is similar to when she had CHF 1 year ago. Denies dyspnea.   Past Medical History  Diagnosis Date  . Fibroid   . Morbid obesity   . 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 COPD (chronic obstructive pulmonary disease)   . Chronic diastolic CHF (congestive heart failure)   . NSTEMI (non-ST elevated myocardial infarction)     Cath November 2014 LAD 40% stenosis, first diagonal 80% stenosis, circumflex 20% stenosis, right coronary artery occluded. The EF was 35-40% at that time.  . Anemia     a. Adm 09/2012 with melena, Hgb 5.8 -> transfused. EGD/colonoscopy unrevealing.  . Breast cancer     a. Mets to bone. ER 100%/ PR 0%/Her2 neu negative.  [name] Ulcers of both lower legs   . Tobacco abuse   . Hyperlipidemia 02/11/2013  . Diabetes mellitus without complication   . Chronic respiratory failure     a. On O2 qhs. also portable O2.  . Chronic renal insufficiency   . Lesion of vocal cord     a.  CT 05/2013 concerning for tumor.   Past Surgical History  Procedure Laterality Date  . Colonoscopy, esophagogastroduodenoscopy (egd) and esophageal dilation N/A 10/13/2012    Procedure: colonoscopy and egd;  Surgeon: Wonda Horner, MD;  Location: [facility];  Service: Endoscopy;  Laterality: N/A;   Family History  Problem Relation Age of Onset  . Cancer Mother     leukemia  . Hypertension Mother   . Diabetes Father   . Heart disease Father     passed away due to heart attack   History  Substance Use Topics  . Smoking status: Former Smoker -- 2.00 packs/day for 40 years    Types: Cigarettes    Quit date: 09/24/2012  . Smokeless tobacco: Never Used  . Alcohol Use: No   OB History   Grav Para Term Preterm Abortions TAB SAB Ect Mult Living   _0 Review of Systems  Constitutional: Negative for fever.  Respiratory: Negative for shortness of breath.   Cardiovascular: Positive for leg swelling. Negative for chest pain.  Gastrointestinal: Negative for vomiting.  Neurological: Positive for weakness. Negative for numbness.  All other systems reviewed and are negative.     Allergies  Omnipaque and Benzene  Home Medications   Prior to Admission medications  Medication Sig Start Date End Date Taking? Authorizing Provider  albuterol (PROVENTIL HFA;VENTOLIN HFA) 108 (90 BASE) MCG/ACT inhaler Inhale 1 puff into the lungs 2 (two) times daily as needed for wheezing or shortness of breath.   Yes Historical Provider, MD  aspirin EC 81 MG EC tablet Take 1 tablet (81 mg total) by mouth daily. 10/02/12  Yes Leone Haven, MD  atorvastatin (LIPITOR) 40 MG tablet Take 1 tablet (40 mg total) by mouth daily at 6 PM. 02/11/13  Yes Coral Spikes, DO  furosemide (LASIX) 40 MG tablet Take 80 mg by mouth daily.  10/12/13  Yes Minus Breeding, MD  HYDROcodone-acetaminophen (NORCO/VICODIN) 5-325 MG per tablet Take 2 tablets by mouth every 4 (four) hours as needed for moderate pain.  12/26/13  Yes Kaitlyn Szekalski, PA-C  isosorbide mononitrate (IMDUR) 60 MG 24 hr tablet Take 1 tablet (60 mg total) by mouth daily. 10/12/13  Yes Minus Breeding, MD  letrozole [facility]) 2.5 MG tablet Take 1 tablet (2.5 mg total) by mouth daily. 10/12/13  Yes [name], MD  losartan (COZAAR) 25 MG tablet Take 1 tablet (25 mg total) by mouth daily. 10/12/13  Yes Minus Breeding, MD  metFORMIN (GLUCOPHAGE) 500 MG tablet Take 1 tablet (500 mg total) by mouth 2 (two) times daily with a meal. 12/04/13  Yes Coral Spikes, DO  nitroGLYCERIN (NITROSTAT) 0.4 MG SL tablet Place 1 tablet (0.4 mg total) under the tongue every 5 (five) minutes as needed for chest pain. 03/17/13  Yes Cordelia Poche, MD  palbociclib [facility]) 125 MG capsule Take 125 mg by mouth daily with breakfast. Take whole with food.   Yes Historical Provider, MD  potassium chloride SA (K-DUR,KLOR-CON) 20 MEQ tablet Take 20-40 mEq by mouth 2 (two) times daily. 2 tablets (40 mEq) AM and 1 tablet ( 20 mEq) PM 10/12/13  Yes Minus Breeding, MD  Umeclidinium-Vilanterol 62.5-25 MCG/INH AEPB Inhale 1 puff into the lungs as needed (for COPD). 08/12/13  Yes Chesley Mires, MD  VITAMIN D, CHOLECALCIFEROL, PO Take 1 capsule by mouth daily.   Yes Historical Provider, MD  NON FORMULARY Place 3 L into the nose as needed (oxygen).     Historical Provider, MD   BP 117/46  Pulse 86  Temp(Src) 98.8 F (37.1 C) (Oral)  Resp 16 Physical Exam  Nursing note and vitals reviewed. Constitutional: She is oriented to person, place, and time. She appears well-developed and well-nourished. No distress.  HENT:  Head: Normocephalic and atraumatic.  Right Ear: External ear normal.  Left Ear: External ear normal.  Nose: Nose normal.  Eyes: Right eye exhibits no discharge. Left eye exhibits no discharge.  Cardiovascular: Normal rate, regular rhythm and normal heart sounds.   Pulmonary/Chest: Effort normal and breath sounds normal.  Abdominal: Soft. She exhibits no distension.  There is no tenderness.  Musculoskeletal:  Right arm in sling and splint. Radial pulse intact. Fingers with good capillary refill. Bilateral lower extremities w/o signs of pitting edema or tenderness  Neurological: She is alert and oriented to person, place, and time.  Skin: Skin is warm and dry.    ED Course  Procedures (including critical care time) Labs Review Labs Reviewed  CBC WITH DIFFERENTIAL - Abnormal; Notable for the following:    Hemoglobin 10.2 (*)    HCT 31.0 (*)    MCV 77.1 (*)    MCH 25.4 (*)    RDW 17.8 (*)    Neutrophils Relative % 81 (*)    Lymphocytes Relative  10 (*)    All other components within normal limits  COMPREHENSIVE METABOLIC PANEL - Abnormal; Notable for the following:    Potassium 3.3 (*)    Glucose, Bld 136 (*)    BUN 31 (*)    Creatinine, Ser 1.24 (*)    Calcium 8.0 (*)    Alkaline Phosphatase 147 (*)    GFR calc non Af Amer 44 (*)    GFR calc Af Amer 51 (*)    All other components within normal limits  PRO B NATRIURETIC PEPTIDE - Abnormal; Notable for the following:    Pro B Natriuretic peptide (BNP) 379.4 (*)    All other components within normal limits    Imaging Review Dg Chest 2 View  01/09/2014   CLINICAL DATA:  Arm injury.  EXAM: CHEST  2 VIEW  COMPARISON:  07/06/2013  FINDINGS: There is chronic cardiomegaly. Unchanged mediastinal contours. No edema, consolidation, effusion, or pneumothorax. Evidence of bony metastatic disease with lytic lesions in the right acromion, right clavicle, surgical neck of the left humerus, anterior left second rib, posterior left seventh rib, distal right humerus, and left glenoid.  IMPRESSION: 1. No active cardiopulmonary disease. 2. Multi focal osseous metastatic disease.   Electronically Signed   By: [name] M.D.   On: 01/09/2014 13:10     EKG Interpretation None      MDM   Final diagnoses:  Closed right arm fracture    Patient claiming she cannot take care of herself at home. After  discussion with the patient and then the on-call family practice physician, Dr. Awanda Mink, the decision was made to admit the patient. She will likely need palliative care consult given her severe disease of multiple organs as well as likely placement.    Ephraim Hamburger, MD 01/10/14 [phone]

## 2014-01-10 NOTE — ED Notes (Signed)
Patient walked to the restroom on own. The patient steady on feet. No further changes noted at this time.

## 2014-01-10 NOTE — ED Notes (Addendum)
Daughters Name [name] and phone number. Please call with progress or when the Social worker sees that patient. [phone]

## 2014-01-10 NOTE — ED Notes (Signed)
Patient had her right arm and shoulder broken yesterday and was seen and treated in the ER. The patient reports that she is no longer able to care for herself at home by herself and she does not qualify for any home care services. She was told by her MD - Hess to come here to be placed inpatient until she could get admitted to a nursing home. Patient is ambulatory

## 2014-01-10 NOTE — H&P (Signed)
[facility] Admission History and Physical Service Pager: [phone]  Patient name: Anita [name] Medical record number: [id number] Date of birth: 09/25/1945 Age: 68 y.o. Gender: female  Primary Care Provider: [name], DO Consultants: Palliative Medicine  Code Status: Full code for now   Chief Complaint: Weakness   Assessment and Plan: Anita [name] is a 68 y.o. female presenting with worsening weakness 2/2 metastatic breast CA to bone . PMH is significant for Metastatic Mammary CA w/ bone metastasis, HLD, CAD (NSTEMI x 3, most recent 2014 with cath in November 2014 showing RCA/LAD occlusion ), CKD, Chronic Systolic/Diastolic CHF, COPD, type II DM , anemia of chornic disease, Hx of tobacco abuse.   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  No new findings on lab work or CXR and denies any new complaints    - Admit to inpatient med surg, vitals per unit - With her extensive cancer diagnosis and metastasis to bone, would ideally like hematology/oncology to estimate her prognosis.  However, with her declining function and inability to care for herself/worsening weakness, will consult palliative for GOC and end of life discussion possibly - PT/OT, CSW for SNF placement vs hospice if qualifies  - Continue home Vicodin, Vitamin D  - Continue home palbociclib, if questions about medication, may be worth consulting heme/onc or discussion with pharmacy   # Hx of CAD (PCI 06/2013 showing 20% occlusion of LCx, 40% occlusion of LAD with 85% occlusion of first diagonal, RCA with chronic disease, and EF of 30-35%)  - Continue medical management with ASA, Imdur, and Losartan   # Hx of Combined Systolic/Diastolic CHF.  Pro BNP on admission probably at her baseline and CXR does not show pulmonary edema  - Currently euvolemic - Has  seen cardiology and her PCP, Dr. Lacinda Axon and does not want addition of new medications - Avoid Beta blocker 2/2 underlying COPD so continue lasix, Imdur, and losartan.   # Hx of COPD  - Continue home oxygen and medications  #DM II, controlled - Last A1C 7.0 in April 2015 - Hold home metformin, Sensitive SSI   # Hx of CKD stage II-III - Creatinine around baseline, continue to monitor   #Hypokalemia - Replete K+ - Check Mg - Recheck BMET in AM    FEN/GI: SLIV, Regular Diet Prophylaxis: SQ heparin   Disposition: Med surg pending futher evaluation   History of Present Illness: Anita [name] is a 68 y.o. female presenting with worsening weakness in her legs and inability to take care of herself at home.  Pt was recently seen in the ED for pathologic R distal humerus fx 2/2 bone metastasis from breast CA and feels that she is unable to take care of herself at home 2/2 to this.  Home Health RN went to her house today for visit and noticed that she was unable to care for herself including medications, getting out of bed due to weakness, and inability to shower/make meals.  Her husband is at work M/W/F and she does not have supervision 24 hrs.  No new changes in her health since previously being seen in the ED about 10 days ago.  Still having LE edema and pain, but denies CP/SOB, fever, chills, sweats, HA, blurred vision, palpitations.   In the ED, pt had evaluations done showing unchanged lab  work, BNP of 379, and called for admission for weakness.   Previous 80 pk yr history, no current alcohol or drug use.   Review Of Systems: Per HPI   Patient Active Problem List   Diagnosis Date Noted  . Closed right arm fracture 01/10/2014  . Malodorous urine 12/06/2013  . Intertrigo 08/16/2013  . Vaginal yeast infection 08/16/2013  . 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 Insomnia 03/23/2013  . Hyperlipidemia 02/11/2013  . Breast cancer metastasized to bone 10/14/2012  . Chronic respiratory failure  10/06/2012  . Chronic combined systolic and diastolic congestive heart failure 10/06/2012  . CAD (coronary artery disease) 10/06/2012  . Chronic kidney disease 10/06/2012  . Anemia of chronic disease 10/06/2012  . Breast mass, right 10/06/2012  . History of tobacco abuse 10/06/2012  . COPD (chronic obstructive pulmonary disease) 09/27/2012  . DM2 (diabetes mellitus, type 2) 09/26/2012   Past Medical History: Past Medical History  Diagnosis Date  . Fibroid   . Morbid obesity   . 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 COPD (chronic obstructive pulmonary disease)   . Chronic diastolic CHF (congestive heart failure)   . NSTEMI (non-ST elevated myocardial infarction)     Cath November 2014 LAD 40% stenosis, first diagonal 80% stenosis, circumflex 20% stenosis, right coronary artery occluded. The EF was 35-40% at that time.  . Anemia     a. Adm 09/2012 with melena, Hgb 5.8 -> transfused. EGD/colonoscopy unrevealing.  . Breast cancer     a. Mets to bone. ER 100%/ PR 0%/Her2 neu negative.  [name] Ulcers of both lower legs   . Tobacco abuse   . Hyperlipidemia 02/11/2013  . Diabetes mellitus without complication   . Chronic respiratory failure     a. On O2 qhs. also portable O2.  . Chronic renal insufficiency   . Lesion of vocal cord     a. CT 05/2013 concerning for tumor.   Past Surgical History: Past Surgical History  Procedure Laterality Date  . Colonoscopy, esophagogastroduodenoscopy (egd) and esophageal dilation N/A 10/13/2012    Procedure: colonoscopy and egd;  Surgeon: Wonda Horner, MD;  Location: [facility];  Service: Endoscopy;  Laterality: N/A;   Social History: History  Substance Use Topics  . Smoking status: Former Smoker -- 2.00 packs/day for 40 years    Types: Cigarettes    Quit date: 09/24/2012  . Smokeless tobacco: Never Used  . Alcohol Use: No    Family History: Family History  Problem Relation Age of Onset  . Cancer Mother      leukemia  . Hypertension Mother   . Diabetes Father   . Heart disease Father     passed away due to heart attack   Allergies and Medications: Allergies  Allergen Reactions  . Omnipaque [Iohexol]     Pt claims she developed hives after given contrast  . Benzene Rash   No current facility-administered medications on file prior to encounter.   Current Outpatient Prescriptions on File Prior to Encounter  Medication Sig Dispense Refill  . albuterol (PROVENTIL HFA;VENTOLIN HFA) 108 (90 BASE) MCG/ACT inhaler Inhale 1 puff into the lungs 2 (two) times daily as needed for wheezing or shortness of breath.      [name] aspirin EC 81 MG EC tablet Take 1 tablet (81 mg total) by mouth daily.  30 tablet  3  . atorvastatin (LIPITOR) 40 MG tablet Take 1 tablet (40 mg total) by  mouth daily at 6 PM.  30 tablet  5  . furosemide (LASIX) 40 MG tablet Take 80 mg by mouth daily.       [name] HYDROcodone-acetaminophen (NORCO/VICODIN) 5-325 MG per tablet Take 2 tablets by mouth every 4 (four) hours as needed for moderate pain.      . isosorbide mononitrate (IMDUR) 60 MG 24 hr tablet Take 1 tablet (60 mg total) by mouth daily.  90 tablet  1  . letrozole (FEMARA) 2.5 MG tablet Take 1 tablet (2.5 mg total) by mouth daily.  90 tablet  3  . losartan (COZAAR) 25 MG tablet Take 1 tablet (25 mg total) by mouth daily.  90 tablet  1  . metFORMIN (GLUCOPHAGE) 500 MG tablet Take 1 tablet (500 mg total) by mouth 2 (two) times daily with a meal.  60 tablet  3  . nitroGLYCERIN (NITROSTAT) 0.4 MG SL tablet Place 1 tablet (0.4 mg total) under the tongue every 5 (five) minutes as needed for chest pain.  30 tablet  0  . potassium chloride SA (K-DUR,KLOR-CON) 20 MEQ tablet Take 20-40 mEq by mouth 2 (two) times daily. 2 tablets (40 mEq) AM and 1 tablet ( 20 mEq) PM      . Umeclidinium-Vilanterol 62.5-25 MCG/INH AEPB Inhale 1 puff into the lungs as needed (for COPD).      . NON FORMULARY Place 3 L into the nose as needed (oxygen).          Objective: BP 117/46  Pulse 86  Temp(Src) 98.8 F (37.1 C) (Oral)  Resp 16 Exam: General: NAD, comfortable in bed HEENT: [place]/AT, MMM Neck: No JVD Cardiovascular: RRR, no murmurs appreciated  Respiratory: no wheezing or increased WOB or accessory muscle use.  Prolonged expiratory phase, no crackles or rales  Abdomen: Soft/NT/ND, NABS Extremities: R arm in sling, radial pulse +2 B/L, Sensation and motor function intact B/L UE.  LE with + 1 edema B/L  Skin: No rashes Neuro: No focal deficits, AAO x 3  Labs and Imaging: CBC BMET   Recent Labs Lab 01/10/14 1355  WBC 7.3  HGB 10.2*  HCT 31.0*  PLT 343    Recent Labs Lab 01/10/14 1355  NA 140  K 3.3*  CL 102  CO2 25  BUN 31*  CREATININE 1.24*  GLUCOSE 136*  CALCIUM 8.0*     CXR - No acute cardiopulmonary disease   Nolon Rod, DO 01/10/2014, 2:46 PM PGY-2, [place] Intern pager: [phone], text pages welcome

## 2014-01-10 NOTE — ED Notes (Signed)
Bed: EM33 Expected date:  Expected time:  Means of arrival:  Comments: Nursing home placement

## 2014-01-10 NOTE — Telephone Encounter (Signed)
Ms. Duffy home health nurse called the after hours line stating that there is not much she can do for her at her house and she is unsteady and unable to care for herself.  I told her that she could send the pt back to the ED for further evaluation, and that if determined by the ED physicians, we would admit her eventually for placement and goals of care/palliative discussions.  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  She would definitely benefit from palliative meeting, PT/OT/CSW for placement, and possible end of life discussion/hospice discussion.    Tamela Oddi Awanda Mink, DO of [facility] 01/10/2014, 11:55 AM

## 2014-01-11 ENCOUNTER — Encounter: Payer: Self-pay | Admitting: Family Medicine

## 2014-01-11 ENCOUNTER — Telehealth: Payer: Self-pay | Admitting: Internal Medicine

## 2014-01-11 DIAGNOSIS — I5042 Chronic combined systolic (congestive) and diastolic (congestive) heart failure: Secondary | ICD-10-CM | POA: Diagnosis not present

## 2014-01-11 DIAGNOSIS — R5383 Other fatigue: Principal | ICD-10-CM

## 2014-01-11 DIAGNOSIS — R5381 Other malaise: Secondary | ICD-10-CM | POA: Diagnosis not present

## 2014-01-11 DIAGNOSIS — C50919 Malignant neoplasm of unspecified site of unspecified female breast: Secondary | ICD-10-CM | POA: Diagnosis not present

## 2014-01-11 DIAGNOSIS — I509 Heart failure, unspecified: Secondary | ICD-10-CM

## 2014-01-11 DIAGNOSIS — S42309A Unspecified fracture of shaft of humerus, unspecified arm, initial encounter for closed fracture: Secondary | ICD-10-CM | POA: Diagnosis not present

## 2014-01-11 LAB — CBC
HCT: 31.7 % — ABNORMAL LOW (ref 36.0–46.0)
Hemoglobin: 10.2 g/dL — ABNORMAL LOW (ref 12.0–15.0)
MCH: 24.9 pg — ABNORMAL LOW (ref 26.0–34.0)
MCHC: 32.2 g/dL (ref 30.0–36.0)
MCV: 77.3 fL — AB (ref 78.0–100.0)
Platelets: 340 10*3/uL (ref 150–400)
RBC: 4.1 MIL/uL (ref 3.87–5.11)
RDW: 18.1 % — AB (ref 11.5–15.5)
WBC: 7 10*3/uL (ref 4.0–10.5)

## 2014-01-11 LAB — BASIC METABOLIC PANEL
BUN: 35 mg/dL — ABNORMAL HIGH (ref 6–23)
CO2: 25 meq/L (ref 19–32)
CREATININE: 1.29 mg/dL — AB (ref 0.50–1.10)
Calcium: 7.8 mg/dL — ABNORMAL LOW (ref 8.4–10.5)
Chloride: 101 mEq/L (ref 96–112)
GFR calc non Af Amer: 42 mL/min — ABNORMAL LOW (ref >90)
GFR, EST AFRICAN AMERICAN: 49 mL/min — AB (ref >90)
Glucose, Bld: 144 mg/dL — ABNORMAL HIGH (ref 70–99)
Potassium: 3.4 mEq/L — ABNORMAL LOW (ref 3.7–5.3)
SODIUM: 141 meq/L (ref 137–147)

## 2014-01-11 LAB — GLUCOSE, CAPILLARY
GLUCOSE-CAPILLARY: 110 mg/dL — AB (ref 70–99)
GLUCOSE-CAPILLARY: 131 mg/dL — AB (ref 70–99)
GLUCOSE-CAPILLARY: 125 mg/dL — AB (ref 70–99)
Glucose-Capillary: 117 mg/dL — ABNORMAL HIGH (ref 70–99)
Glucose-Capillary: 116 mg/dL — ABNORMAL HIGH (ref 70–99)

## 2014-01-11 MED ORDER — MAGNESIUM SULFATE IN D5W 10-5 MG/ML-% IV SOLN
1.0000 g | Freq: Once | INTRAVENOUS | Status: AC
  Administered 2014-01-11: 1 g via INTRAVENOUS
  Filled 2014-01-11: qty 100

## 2014-01-11 MED ORDER — IPRATROPIUM-ALBUTEROL 0.5-2.5 (3) MG/3ML IN SOLN: 3.0000 mL | Freq: Four times a day (QID) | RESPIRATORY_TRACT | Status: DC | PRN

## 2014-01-11 NOTE — Telephone Encounter (Signed)
lmonvm on both home/cell re appt for 5/20.

## 2014-01-11 NOTE — H&P (Signed)
FMTS Attending Admission Note: Anita Sabal MD Personal pager:  [phone] FPTS Service Pager:  [phone]  I  have seen and examined this patient, reviewed their chart. I have discussed this patient with the resident. I agree with the resident's findings, assessment and care plan.  Additionally:  68 yo F with known metastatic breast CA and laryngeal CA.  Presents with generalized weakness and inability to care for herself at home.  Recently suffered pathologic fx of Right arm, contributes to inabilty to care for herself.  This AM, states she fells "okay" but with pain in arm and swelling in legs.  My exam reveals chronically ill appearing patient in NAD, right arm in sling, BL Legs with +2 edema.  Abdomen benign.  Lungs clear.  Imp/Plan: 1.  Weakness: - likely secondary to metastasis.  Will likely need placement - Agree with goals of care  2.  Breast CA, Stage IV: - GOC as above - in an outpt note, Onc mentions "palliative letrozole" but otherwise seems to be aggressively treating BrCA (and laryngeal CA) - Agree with palliative consult for further discussion.   3.  Electrolytes: - Previously hypercalcemic, not hypocalcemic s/p treatment - follow  Alveda Reasons, MD 01/11/2014 7:07 AM

## 2014-01-11 NOTE — Progress Notes (Signed)
Family Medicine Teaching Service Daily Progress Note Intern Pager: [phone]  Patient name: Anita [name] Medical record number: [id number] Date of birth: 09-Jun-1946 Age: 68 y.o. Gender: female  Primary Care Provider: Everlene Other, DO Consultants: Palliative medicine Code Status: Full  Pt Overview and Major Events to Date:   Assessment and Plan: Anita [name] is a 68 y.o. female presenting with worsening weakness 2/2 metastatic breast CA to bone . PMH is significant for Metastatic Mammary CA w/ bone metastasis, HLD, CAD (NSTEMI x 3, most recent 2014 with cath in November 2014 showing RCA/LAD occlusion ), CKD, Chronic Systolic/Diastolic CHF, COPD, type II DM , anemia of chornic disease, Hx of tobacco abuse.   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 No new findings on lab work or CXR and denies any new complaints  - With her declining function and inability to care for herself/worsening weakness, will consult palliative for GOC and end of life discussion possibly  - PT/OT, CSW for SNF placement vs hospice if qualifies  - Continue home Vicodin, Vitamin D - Continue home palbociclib, if questions about medication, may be worth consulting heme/onc or discussion with pharmacy   # Hx of CAD (PCI 06/2013 showing 20% occlusion of LCx, 40% occlusion of LAD with 85% occlusion of first diagonal, RCA with chronic disease, and EF of 30-35%)  - Continue medical management with ASA, Imdur, and Losartan   # Hx of Combined Systolic/Diastolic CHF. Pro BNP on admission probably at her baseline and CXR does not show pulmonary edema  - Currently euvolemic  - Has seen cardiology and her PCP, Dr. Adriana Simas and does not want addition of new medications  - Avoid Beta blocker 2/2 underlying COPD so continue lasix, Imdur, and losartan.   # Hx of COPD  - Continue home oxygen and  medications   #DM II, controlled  - Last A1C 7.0 in April 2015  - Hold home metformin, Sensitive SSI   # Hx of CKD stage II-III  - Creatinine around baseline, continue to monitor   # Hypokalemia  - Replete K+  - Check Mg  - Recheck BMET this AM 3.4; ordered for 4x kdur on admission  FEN/GI: SLIV, Regular Diet  Prophylaxis: SQ heparin   Disposition: pending placement  Subjective:  Doing okay this morning. Has continued pain in left thigh and right arm (known cancer mets to those locations). Denies CP, SOB, n/v, fevers/chills. Wants to go to "short term" rehab from the hospital as she cannot take care of herself at home.  Objective: Temp:  [98.4 F (36.9 C)-99.9 F (37.7 C)] 98.5 F (36.9 C) (05/18 0500) Pulse Rate:  [83-104] 83 (05/18 0500) Resp:  [16] 16 (05/18 0500) BP: (107-125)/(45-50) 125/50 mmHg (05/18 0500) SpO2:  [98 %-100 %] 98 % (05/18 0500) Weight:  [234 lb (106.142 kg)] 234 lb (106.142 kg) (05/17 2323) Physical Exam: General: NAD, sitting up in chair, appears pleasant Cardiovascular: RRR, normal s1/s2, no murmurs Respiratory: mild bibasilar crackles, normal effort Abdomen: soft, obese, NTND, normal bowel sounds Extremities: 1-2+ pitting edema bilaterally Neuro: alert and oriented. No focal deficits.  Laboratory:  Recent Labs Lab 01/10/14 1355 01/11/14 0642  WBC 7.3 7.0  HGB 10.2* 10.2*  HCT 31.0* 31.7*  PLT 343 340    Recent Labs Lab 01/10/14 1355  NA 140  K  3.3*  CL 102  CO2 25  BUN 31*  CREATININE 1.24*  CALCIUM 8.0*  PROT 7.0  BILITOT 0.4  ALKPHOS 147*  ALT 24  AST 18  GLUCOSE 136*   Pro BNP 379.4 Mag 2.0  Imaging/Diagnostic Tests: Dg Chest 2 View  01/09/2014   CLINICAL DATA:  Arm injury.  EXAM: CHEST  2 VIEW  COMPARISON:  07/06/2013  FINDINGS: There is chronic cardiomegaly. Unchanged mediastinal contours. No edema, consolidation, effusion, or pneumothorax. Evidence of bony metastatic disease with lytic lesions in the right  acromion, right clavicle, surgical neck of the left humerus, anterior left second rib, posterior left seventh rib, distal right humerus, and left glenoid.  IMPRESSION: 1. No active cardiopulmonary disease. 2. Multi focal osseous metastatic disease.   Electronically Signed   By: [name] M.D.   On: 01/09/2014 13:10    Tawanna Sat, MD 01/11/2014, 7:35 AM PGY-1, [place] Intern pager: [phone], text pages welcome

## 2014-01-11 NOTE — Telephone Encounter (Signed)
I will forward to Dr Percival Spanish for his knowledge.

## 2014-01-11 NOTE — Progress Notes (Signed)
Occupational Therapy Treatment Patient Details Name: Anita [name] MRN: [id number] DOB: December 07, 1945 Today's Date: 01/11/2014    History of present illness pt presents with FTT at home since having R Humerus fx (01/03/14).  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     OT comments  This session was successful in getting her ring off of her right hand that was getting tighter and tighter around her finger due to increasing edema. Positioning of her RUE for optimal edema control is going to be an ongoing battle due to her not staying still long enough for it to stay positioned properly and she cannot reposition it by herself. Will continue to follow.  Follow Up Recommendations  SNF    Equipment Recommendations   (TBD next venue)       Precautions / Restrictions Precautions Precautions: Fall Precaution Comments: broken humerus that they are trying to treat conservatively Required Braces or Orthoses: Sling Restrictions Weight Bearing Restrictions: Yes RUE Weight Bearing: Non weight bearing                              Cognition   Behavior During Therapy: WFL for tasks assessed/performed Overall Cognitive Status:  (standing up even though we had told her not to get up by hersefl)                         Exercises Other Exercises Other Exercises: Notfied by nurse that pt was ready for wrapping to come off of her hand (it had been 1 hour and 20 minutes since it had been put on). In to take coban off and edema had diminshed some, but not enough to get ring off. Repostioned arm up higher on bedside table and applied some liquid some to her finger, which made ring more loose but still not able to get it off of her finger.  Applied ice to her finger off and on for 5 minutes, applied more liquid soap and then with twisting and pulling ring at same time was able to ger ring off of her finger and gave it to her husband who was in the room.  Left pt in recliner with  RUE in sling and propped up in a wonderful postion for edema control.--however noted as I passed back by her room not 5 minuted later that she had repostioned herself thus this optimal position was no longer there. Had her do composite finger flexion/extension throughout session to work on combating the edema in her hand.           Pertinent Vitals/ Pain       Mild c/o pain in RUE; repositioned         Frequency Min 3X/week     Progress Toward Goals  OT Goals(current goals can now be found in the care plan section)  Progress towards OT goals: Progressing toward goals     Plan Discharge plan remains appropriate       End of Session     Activity Tolerance Patient tolerated treatment well   Patient Left in chair;with family/visitor present   Nurse Communication  (Ring was off finger and given to pt's husband)        Time: 7829-5621 OT Time Calculation (min): 21 min  Charges: OT General Charges $OT Visit: 1 Procedure OT Evaluation $Initial OT Evaluation Tier I: 1 Procedure OT Treatments $Orthotics Fit/Training: 38-52 mins $Orthotics/Prosthetics  Check: 8-22 mins  Almon Register [phone] 01/11/2014, 5:54 PM

## 2014-01-11 NOTE — Telephone Encounter (Signed)
Follow up     Want Dr Percival Spanish to know she is in the hosp [facility]---then she hung up

## 2014-01-11 NOTE — Progress Notes (Signed)
Occupational Therapy Treatment Patient Details Name: Anita [name] MRN: [id number] DOB: 09-Nov-1945 Today's Date: 01/11/2014    History of present illness pt presents with FTT at home since having R Humerus fx (01/03/14).  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     OT comments  This 68 yo female not showing good judgement with getting up without someone being in the room with her. Sling had also arrived that I had ordered and looked much better and supported her arm much more than the stockinette wrap she orginally had as a sling. Will continue to monitor to see if between this, coban/re-wrapping her RUE, and positioning we can get edema better under control and ring off of her finger.  Follow Up Recommendations  SNF    Equipment Recommendations   (TBD next venue)       Precautions / Restrictions Precautions Precautions: Fall Precaution Comments: broken humerus that they are trying to treat conservatively Required Braces or Orthoses: Sling Restrictions Weight Bearing Restrictions: Yes RUE Weight Bearing: Non weight bearing        Transfers Overall transfer level: Needs assistance   Transfers: Sit to/from Stand Sit to Stand: Min guard         General transfer comment: cues for safe technique        ADL                                         General ADL Comments: Upon entering room pt standing in front of her recliner without anyone in room. I asked her what she was doing and she said she was waiting on her nurse because she was bleeding in her pubic area. I assessed the situation and pt had scratched her pubic area and right inner thigh until it was bleeding. RN in to asses and determined that barrier cream with anti-fugal power needed to be re-applied. Area thorougly cleaned of blood and residual barrier cream/anti-fungal powder and new was applied.                Cognition   Behavior During Therapy: WFL for tasks  assessed/performed Overall Cognitive Status:  (standing up even though we had told her not to get up by hersefl)                         Exercises Other Exercises Other Exercises: Had acquired some coban to use on her RUE digits and hand to help with decreased edema in these areas. Applied it to each finger individually, between fingers in web space, and around to wrist area. Applying distal to proximal. Then re-wrapped her arm with ace wrap due to it had been wrapped proximal to distal (when the best way for edeme control is distal to proximal.  Plan to leave this on for up to 2 hours and the remove to see if edema decreased and can get ring off of middle finger).            Pertinent Vitals/ Pain       Mild c/o pain while unwrapped; repositioned and re-wrapped and re-positioned on towel rolls hich helped with decreasing of pain         Frequency Min 3X/week     Progress Toward Goals  OT Goals(current goals can now be found in the care plan section)  Progress towards  OT goals: Progressing toward goals     Plan Discharge plan remains appropriate          Activity Tolerance Patient tolerated treatment well   Patient Left in chair;with call bell/phone within reach;with family/visitor present   Nurse Communication  (Call me if pt c/o wrapping before I come back at 4:00pm ish)        Time: 2025-4270 OT Time Calculation (min): 40 min  Charges: OT Treatments $Orthotics Fit/Training: 38-52 mins  Almon Register [phone] 01/11/2014, 5:42 PM

## 2014-01-11 NOTE — Progress Notes (Signed)
I discussed this patient's clinical course and plan with the resident team, and I agree with the resident note as documented.  Sacha Topor, MD 

## 2014-01-11 NOTE — Clinical Social Work Placement (Addendum)
Clinical Social Work Department  CLINICAL SOCIAL WORK PLACEMENT NOTE  Patient: Anita [name] Account Number: [id number] Admit date: 01/10/14  Clinical Social Worker: Rhea Pink LCSWA Date/time: 01/11/2014 11:30 AM  Clinical Social Work is seeking post-discharge placement for this patient at the following level of care: SKILLED NURSING (*CSW will update this form in Epic as items are completed)  01/11/2014 Patient/family provided with [place] Department of Clinical Social Work's list of facilities offering this level of care within the geographic area requested by the patient (or if unable, by the patient's family).  01/11/2014 Patient/family informed of their freedom to choose among providers that offer the needed level of care, that participate in Medicare, Medicaid or managed care program needed by the patient, have an available bed and are willing to accept the patient.  01/11/2014 Patient/family informed of [facility]' ownership interest in [facility], as well as of the fact that they are under no obligation to receive care at this facility.  PASARR submitted to EDS on 01/13/2014 PASARR number received from EDS on 01/13/2014  FL2 transmitted to all facilities in geographic area requested by pt/family on 01/11/2014  FL2 transmitted to all facilities within larger geographic area on  Patient informed that his/her managed care company has contracts with or will negotiate with certain facilities, including the following:  Patient/family informed of bed offers received:  Patient chooses bed at [facility] Physician recommends and patient chooses bed at  Patient to be transferred to on 01/13/2014 Patient to be transferred to facility by [facility] The following physician request were entered in Epic:  Additional Comments:

## 2014-01-11 NOTE — Progress Notes (Signed)
PCP Note:  Patient seen with family at bedside. Discuss hospitalization and updated family regarding patient's status and further metastasis of cancer. Family, apparently, has not been made aware of status of her cancer as Mrs. Markgraf has not been open/forthcoming about this.  Disposition pending palliative care [place] meeting.  If I can be of any assistance please page me at [phone].

## 2014-01-11 NOTE — Progress Notes (Signed)
CSW observed that pt was admitted into the hospital. CSW sent [facility] orders on Friday 01/08/14 to [facility]. CSW will hold off on sending [place] form to [facility] until disposition is determined.   Unit CSW will assist with discharge plans.  Hunt Oris, MSW, [place]

## 2014-01-11 NOTE — Progress Notes (Signed)
Clinical Social Work Department  BRIEF PSYCHOSOCIAL ASSESSMENT  Patient: Anita [name]  Account Number:[id number]   Admit date: 01/10/14 Clinical Social Worker  , MSW Date/Time: 01/11/2014 3:30 PM Referred by: Physician Date Referred:  Referred for   SNF Placement   Other Referral:  Interview type: Patient and patient's friend and son at bedside Other interview type: PSYCHOSOCIAL DATA  Living Status: Spouse Admitted from facility:  Level of care:  Primary support name: Howard Challis Primary support relationship to patient: Husband Degree of support available:  Strong and vested  CURRENT CONCERNS  Current Concerns   Post-Acute Placement   Other Concerns:  SOCIAL WORK ASSESSMENT / PLAN  CSW met with pt and patient's friend at bedside. CSW offered support and discussed SNF placement. Patient reported that she is going to need to get stronger before returning home. CSW reassured patient that all paperwork necessary to get patient to SNF would be completed. Patient thanked CSW for support and assistance   re: PT recommendation for SNF.   Pt lives with husband  CSW explained placement process and answered questions.   Pt reports [facility]  as her preference    CSW completed FL2 and initiated SNF search.     Assessment/plan status: Information/Referral to Community Resources  Other assessment/ plan:  Information/referral to community resources:  SNF   PTAR  PATIENT'S/FAMILY'S RESPONSE TO PLAN OF CARE:  Pt  reports she is agreeable to [facility] SNF in order to increase strength and independence with mobility prior to returning home  Pt verbalized understanding of placement process and appreciation for CSW assist.    , MSW [phone] 

## 2014-01-11 NOTE — Progress Notes (Signed)
Patient UJ:[id number] C Yackel      DOB: 05/16/1946      NWG:[id number] Patient wants dtr and spouse to be present requests 4-430 mtg tomorrow for goc.  Lenix Benoist L. Lovena Le, MD MBA The Palliative Medicine Team at [facility] Phone: [phone] Pager: [phone]

## 2014-01-11 NOTE — Evaluation (Signed)
Physical Therapy Evaluation Patient Details Name: Anita [name] MRN: [id number] DOB: [birth year] Today's Date: 01/11/2014   History of Present Illness  pt presents with FTT at home since having R Humerus fx.  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    Clinical Impression  Pt very agreeable to PT and mobility.  Pt would benefit from SNF at D/C to maximize strength and mobility prior to returning to home with husband who works during the day.  Will continue to follow.      Follow Up Recommendations SNF    Equipment Recommendations  None recommended by PT    Recommendations for Other Services       Precautions / Restrictions Precautions Precautions: Fall;Shoulder Precaution Comments: pt would benefit from sling as she has her arm tied around her neck, however pt states that staff at Orthopedic office tied it like this.  RN made aware.   Restrictions Weight Bearing Restrictions: Yes RUE Weight Bearing: Non weight bearing      Mobility  Bed Mobility                  Transfers Overall transfer level: Needs assistance Equipment used: Straight cane Transfers: Sit to/from Stand Sit to Stand: Min guard         General transfer comment: cues for safe technique and to get closer to chair prior to sitting.    Ambulation/Gait Ambulation/Gait assistance: Min guard Ambulation Distance (Feet): 30 Feet (x2) Assistive device: Straight cane Gait Pattern/deviations: Step-through pattern;Decreased stride length;Shuffle     General Gait Details: pt moves very slowly and shuffled gait.  pt indicates that as her CA has progressed she has been shuffling.  pt tells this PT during amb that she normally wears O2 at home and has not had any since being at [facility] made aware.    Stairs            Wheelchair Mobility    Modified Rankin (Stroke Patients Only)       Balance Overall balance assessment: Needs assistance         Standing balance support:  Single extremity supported;No upper extremity supported Standing balance-Leahy Scale: Fair Standing balance comment: pt able to stand without UE A, but during balance challenges or amb need cane for support                             Pertinent Vitals/Pain R UE and Bil LE.  Pt indicates bone mets cause chronic pain.  Premedicated and pt indicates tolerable.      Home Living Family/patient expects to be discharged to:: Skilled nursing facility                      Prior Function Level of Independence: Needs assistance   Gait / Transfers Assistance Needed: Falls and difficulty with transfers since R humerus fx.  Uses cane.    ADL's / Homemaking Assistance Needed: Husband performs all homemaking and pt has a nurse that comes to A with ADLs.          Hand Dominance        Extremity/Trunk Assessment   Upper Extremity Assessment: Defer to OT evaluation           Lower Extremity Assessment: Generalized weakness      Cervical / Trunk Assessment: Normal  Communication   Communication: No difficulties  Cognition Arousal/Alertness: Awake/alert Behavior During  Therapy: WFL for tasks assessed/performed Overall Cognitive Status: No family/caregiver present to determine baseline cognitive functioning                      General Comments      Exercises        Assessment/Plan    PT Assessment Patient needs continued PT services  PT Diagnosis Difficulty walking;Generalized weakness   PT Problem List Decreased strength;Decreased activity tolerance;Decreased balance;Decreased mobility;Decreased coordination;Decreased cognition;Decreased knowledge of use of DME;Cardiopulmonary status limiting activity;Pain  PT Treatment Interventions DME instruction;Gait training;Functional mobility training;Stair training;Therapeutic activities;Therapeutic exercise;Balance training;Patient/family education   PT Goals (Current goals can be found in the Care Plan  section) Acute Rehab PT Goals Patient Stated Goal: Be able to take care of myself PT Goal Formulation: With patient Time For Goal Achievement: 01/25/14 Potential to Achieve Goals: Fair    Frequency Min 3X/week   Barriers to discharge        Co-evaluation               End of Session Equipment Utilized During Treatment: Gait belt Activity Tolerance: Patient tolerated treatment well Patient left: in chair;with call bell/phone within reach Nurse Communication: Mobility status (O2 needs)         Time: 8502-7741 PT Time Calculation (min): 21 min   Charges:   PT Evaluation $Initial PT Evaluation Tier I: 1 Procedure PT Treatments $Gait Training: 8-22 mins   PT G CodesCatarina Hartshorn, PT [phone] 01/11/2014, 8:53 AM

## 2014-01-11 NOTE — Progress Notes (Signed)
Orthopedic Tech Progress Note Patient Details:  Anita [name] August 26, 1946 [id number]  Ortho Devices Type of Ortho Device: Sling immobilizer Ortho Device/Splint Location: rue Ortho Device/Splint Interventions: Application   Anita [name] 01/11/2014, 1:28 PM

## 2014-01-11 NOTE — Evaluation (Signed)
Occupational Therapy Evaluation  Patient Details Name: Anita [name] MRN: [id number] DOB: Dec 23, 1945 Today's Date: 01/11/2014    History of Present Illness pt presents with FTT at home since having R Humerus fx (01/03/14).  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     Clinical Impression   This 68 yo female admitted for above and was needing A at home pta form BADLs and mobility since she broke her right humerus presents to acute OT with decreased use of RUE, increased edema of RUE, obesity, and decreased mobility all affecting pt's ability to care for herself. She will benefit from acute OT with follow up OT at SNF.    Follow Up Recommendations  SNF    Equipment Recommendations   (TBD at next venue)       Precautions / Restrictions Precautions Precautions: Fall (broken humerus) Precaution Comments: pt would benefit from sling as she has her arm tied around her neck, however pt states that staff at Orthopedic office tied it like this.  RN made aware.   Required Braces or Orthoses: Sling Restrictions Weight Bearing Restrictions: Yes RUE Weight Bearing: Non weight bearing              ADL Overall ADL's : Needs assistance/impaired Eating/Feeding: Set up   Grooming: Moderate assistance   Upper Body Bathing: Moderate assistance   Lower Body Bathing: Maximal assistance   Upper Body Dressing : Minimal assistance   Lower Body Dressing: Moderate assistance (just for underwear)                                 Pertinent Vitals/Pain No c/o pain     Hand Dominance Right   Extremity/Trunk Assessment Upper Extremity Assessment Upper Extremity Assessment: RUE deficits/detail RUE Deficits / Details: humerus fx 01/03/14, increased edema in hand and digits (has a ring on middle finger that we will try and get off later today), moves shoulder on her own. It appears that arm is wrapped proximal to distal with ACE wrap--will re-wrap later today distal to  proximal and apply coban to fingers. RUE Coordination: decreased fine motor;decreased gross motor      Communication Communication Communication: No difficulties   Cognition Arousal/Alertness: Awake/alert Behavior During Therapy: WFL for tasks assessed/performed Overall Cognitive Status: No family/caregiver present to determine baseline cognitive functioning                        Exercises   Other Exercises Other Exercises: Had her perform composite flexion/extension of digits RUE and issued her a squeeze ball. Made her aware that she needs to continue to move her fingers to help with edema control        Home Living Family/patient expects to be discharged to:: Skilled nursing facility Living Arrangements: Spouse/significant other                                      Prior Functioning/Environment Level of Independence: Needs assistance  Gait / Transfers Assistance Needed: Falls and difficulty with transfers since R humerus fx.  Uses cane.   ADL's / Homemaking Assistance Needed: Husband performs all homemaking and pt has a Marine scientist and daughter that comes to A with ADLs.  (says they do 100% of bathing, she can get her underwear on but not pulled up and she  can dress her UB in a gown leaving RUE under the gown)        OT Diagnosis: Generalized weakness (RUE ROM deficits)   OT Problem List: Decreased strength;Decreased range of motion;Impaired balance (sitting and/or standing);Decreased coordination;Increased edema;Impaired UE functional use;Obesity;Decreased knowledge of use of DME or AE   OT Treatment/Interventions: Self-care/ADL training;Therapeutic exercise;DME and/or AE instruction;Therapeutic activities;Balance training;Patient/family education    OT Goals(Current goals can be found in the care plan section) Acute Rehab OT Goals Patient Stated Goal: To get ring off of right middle finger OT Goal Formulation: With patient Time For Goal Achievement:  01/18/14 Potential to Achieve Goals: Good  OT Frequency: Min 3X/week   Barriers to D/C: Decreased caregiver support             End of Session Nurse Communication:  (ordered a sling to use with her)  Activity Tolerance: Patient tolerated treatment well Patient left: in chair;with call bell/phone within reach   Time: 1050-1117 OT Time Calculation (min): 27 min Charges:  OT General Charges $OT Visit: 1 Procedure OT Evaluation $Initial OT Evaluation Tier I: 1 Procedure OT Treatments $Therapeutic Exercise: 8-22 mins  Almon Register [phone] 01/11/2014, 11:43 AM

## 2014-01-12 DIAGNOSIS — Z515 Encounter for palliative care: Secondary | ICD-10-CM

## 2014-01-12 DIAGNOSIS — C50919 Malignant neoplasm of unspecified site of unspecified female breast: Secondary | ICD-10-CM

## 2014-01-12 DIAGNOSIS — S42309A Unspecified fracture of shaft of humerus, unspecified arm, initial encounter for closed fracture: Secondary | ICD-10-CM

## 2014-01-12 DIAGNOSIS — R5381 Other malaise: Secondary | ICD-10-CM | POA: Diagnosis not present

## 2014-01-12 DIAGNOSIS — C7951 Secondary malignant neoplasm of bone: Secondary | ICD-10-CM

## 2014-01-12 DIAGNOSIS — C7952 Secondary malignant neoplasm of bone marrow: Secondary | ICD-10-CM

## 2014-01-12 LAB — CBC
HCT: 31.5 % — ABNORMAL LOW (ref 36.0–46.0)
Hemoglobin: 10 g/dL — ABNORMAL LOW (ref 12.0–15.0)
MCH: 24.9 pg — AB (ref 26.0–34.0)
MCHC: 31.7 g/dL (ref 30.0–36.0)
MCV: 78.6 fL (ref 78.0–100.0)
PLATELETS: 322 10*3/uL (ref 150–400)
RBC: 4.01 MIL/uL (ref 3.87–5.11)
RDW: 18.2 % — AB (ref 11.5–15.5)
WBC: 6.8 10*3/uL (ref 4.0–10.5)

## 2014-01-12 LAB — BASIC METABOLIC PANEL
BUN: 38 mg/dL — ABNORMAL HIGH (ref 6–23)
CALCIUM: 7.7 mg/dL — AB (ref 8.4–10.5)
CO2: 21 meq/L (ref 19–32)
CREATININE: 1.38 mg/dL — AB (ref 0.50–1.10)
Chloride: 103 mEq/L (ref 96–112)
GFR calc Af Amer: 45 mL/min — ABNORMAL LOW (ref >90)
GFR, EST NON AFRICAN AMERICAN: 39 mL/min — AB (ref >90)
GLUCOSE: 126 mg/dL — AB (ref 70–99)
Potassium: 3.4 mEq/L — ABNORMAL LOW (ref 3.7–5.3)
SODIUM: 139 meq/L (ref 137–147)

## 2014-01-12 LAB — GLUCOSE, CAPILLARY
GLUCOSE-CAPILLARY: 121 mg/dL — AB (ref 70–99)
Glucose-Capillary: 129 mg/dL — ABNORMAL HIGH (ref 70–99)
Glucose-Capillary: 120 mg/dL — ABNORMAL HIGH (ref 70–99)
Glucose-Capillary: 167 mg/dL — ABNORMAL HIGH (ref 70–99)

## 2014-01-12 LAB — MAGNESIUM: MAGNESIUM: 2.5 mg/dL (ref 1.5–2.5)

## 2014-01-12 MED ORDER — POTASSIUM CHLORIDE CRYS ER 20 MEQ PO TBCR
40.0000 meq | EXTENDED_RELEASE_TABLET | Freq: Once | ORAL | Status: AC
  Administered 2014-01-12: 40 meq via ORAL
  Filled 2014-01-12: qty 2

## 2014-01-12 MED ORDER — POTASSIUM CHLORIDE CRYS ER 20 MEQ PO TBCR: 20.0000 meq | EXTENDED_RELEASE_TABLET | Freq: Once | ORAL | Status: DC

## 2014-01-12 MED ORDER — ISOSORBIDE MONONITRATE ER 30 MG PO TB24
30.0000 mg | ORAL_TABLET | Freq: Every day | ORAL | Status: DC
  Administered 2014-01-13: 30 mg via ORAL
  Filled 2014-01-12: qty 1

## 2014-01-12 NOTE — Progress Notes (Signed)
Patient states she has ordered Palbociclid from [facility] this past Friday. Not taking this med as prescribed d/t availability at local pharmacies. Anticipating the filling of this prescription from [facility] to arrive at her home any day.

## 2014-01-12 NOTE — Progress Notes (Signed)
FMTS Attending Note Patient seen by me and care discussed with resident team, I agree with Dr Parks Ranger' note for today. Dalbert Mayotte, MD

## 2014-01-12 NOTE — Progress Notes (Signed)
Patient c/o terrible pain in rle. No c/o pain voiced for this area prior to today. Patient states she must have non generic Vicodin for it to be effective on her pain. Medicated with 2 tablets. Family at bedside, concerned and supportive.

## 2014-01-12 NOTE — Consult Note (Signed)
[name]      DOB: [birth year]      BPQ:[id number]  Summary of goals of care; full note to follow:  Met with patient, her husband Anita [name], daughter Anita [name], son Anita [name], [name] and grandson  Anita [name] evidently had not been keeping her husband in the loop regarding her diagnosis and so there was palpable tension related to his new knowledge. Evidently, the family talked with him last night but he was visibly angry at not being included.  Anita [name] felt that by meeting today we were treating him as if he were stupid and not able to understand anything.  We were able to resolve that issue but the tension remains in that he wants to provide the primary care for [name] at home even though he works.  Anita [name] stated her goals clearly right now are to continue treatment with Dr. Juliann Mule.  She has designated her daughter as her medical poa and  Her husband as her financial poa but needs to get the paperwork filled out.  We reviewed at their request what an advanced directive is and went over the questions on the MoST form .  I left a copy of the form for Anita [name] and Anita [name] to use as a discussion point.  [name] states that she desires to be a full code at this time.  [name] has an appointment with [name] at the cancer center to do advanced care planning and so we have just started a conversation that she can continue with [name] if she and her husband are unable to talk things through.   Recommend:  1.  Full code status  2.  Transition to SNF  3.  Continue treatment with Dr. Juliann Mule  4. Have medical poa completed while in house, other Advanced directive can be completed with [name] if she has not worked through her thoughts.  Total time: 430- 545 pm  Anita Brau L. Lovena [name], Anita [name] The Palliative Medicine Team at [facility] Phone: [phone] Pager: [phone]

## 2014-01-12 NOTE — Consult Note (Cosign Needed)
Patient [name]      DOB: 01-04-1946      VWU:[id number]     Consult Note from the Palliative Medicine Team at [facility] Requested by: Dr. Mingo Amber     PCP: [name], DO Reason for Consultation: goals of care   Phone Number:[phone]  Assessment of patients Current state: 68 yr old female with known metastatic breast and laryngeal cancer recently sustained a pathologic fracture of the right wrist, since that time she has had increased weakness, falls, and has not been able to care for herself.  It was revealed at this meeting that she has not been sharing the extent of her illness with her husband.  He was informed last evening about the extent of her needs and is not happy with her or the situation as he wants her to come home and he will care for her.  Anita [name] asserts that she can not come home right now and desires to go to a rehab until she can be more independent.  The patient asserts that she desires to continue curative treatment and wants to be a full code.  She agreed to look over a MOST for and talk with her husband and family about future goals of care.  She has an appointment with [name] at the cancer center.  I encouraged her to keep that appointment and review any and all of this information with [name] and consider bringing her spouse.  She stated she desired her Daughter , not from this marriage to be her MPOA.  We will request an advanced directive packet be completed while in house.   Goals of Care: 1.  Code Status: Full code   2. Scope of Treatment: Continue you with full curative treatments.  4. Disposition: to SNF consider PCS services consult to assist with complex family communication issues.   3. Symptom Management:   1. Pain: Norco as needed. 2. Bowel Regimen: monitor add senna if needed. 3. Cancer: treatment per Dr. Juliann Mule et al.  4. Psychosocial: Patient has two children.  Her daughter Anita [name] is a CNA and so has some medical  knowledge.  She just told her spouse last night how sick she is and what she is up against he is very angry with Anita [name] and the family in general for not including him.  5. Spiritual: Jewish.  Her daughter asks for spiritual support as needed.        Patient Documents Completed or Given: Document Given Completed  Advanced Directives Pkt    MOST    DNR    Gone from My Sight    Hard Choices      Brief HPI: 68 yr old with known metastatic breast CA and laryngeal Cancer admitted with weakness and falls after a fall with pathologic fracture of right arm.  We were asked to assist with goals of care.   ROS: pain is tolerable with current meds, no nausea, vomiting    PMH:  Past Medical History  Diagnosis Date  . Fibroid   . Morbid obesity   . 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 COPD (chronic obstructive pulmonary disease)   . Chronic diastolic CHF (congestive heart failure)   . NSTEMI (non-ST elevated myocardial infarction)     Cath November 2014 LAD 40% stenosis, first diagonal 80% stenosis, circumflex 20% stenosis, right coronary artery occluded. The EF was 35-40% at that time.  [name]  Anemia     a. Adm 09/2012 with melena, Hgb 5.8 -> transfused. EGD/colonoscopy unrevealing.  . Breast cancer     a. Mets to bone. ER 100%/ PR 0%/Her2 neu negative.  [name] Ulcers of both lower legs   . Tobacco abuse   . Hyperlipidemia 02/11/2013  . Diabetes mellitus without complication   . Chronic respiratory failure     a. On O2 qhs. also portable O2.  . Chronic renal insufficiency   . Lesion of vocal cord     a. CT 05/2013 concerning for tumor.  . Shortness of breath      PSH: Past Surgical History  Procedure Laterality Date  . Colonoscopy, esophagogastroduodenoscopy (egd) and esophageal dilation N/A 10/13/2012    Procedure: colonoscopy and egd;  Surgeon: Wonda Horner, MD;  Location: [facility];  Service: Endoscopy;  Laterality: N/A;   I have  reviewed the [place] and SH and  If appropriate update it with new information. Allergies  Allergen Reactions  . Omnipaque [Iohexol]     Pt claims she developed hives after given contrast  . Benzene Rash   Scheduled Meds: . aspirin EC  81 mg Oral Daily  . atorvastatin  40 mg Oral q1800  . heparin  5,000 Units Subcutaneous 3 times per day  . insulin aspart  0-9 Units Subcutaneous TID WC  . [START ON 01/13/2014] isosorbide mononitrate  30 mg Oral Daily  . letrozole  2.5 mg Oral Daily  . potassium chloride  40 mEq Oral Once   Continuous Infusions:  PRN Meds:.HYDROcodone-acetaminophen, ipratropium-albuterol, nitroGLYCERIN    BP 104/49  Pulse 87  Temp(Src) 97.7 F (36.5 C) (Oral)  Resp 14  Ht _0  (1.575 m)  Wt 106.142 kg (234 lb)  BMI 42.79 kg/m2  SpO2 98%   PPS: 59%   Intake/Output Summary (Last 24 hours) at 01/12/14 1744 Last data filed at 01/12/14 1230  Gross per 24 hour  Intake    720 ml  Output      0 ml  Net    720 ml     Physical Exam:  General: minimal distress sitting in chair. Occasional grimace with movement HEENT:  PERRL, EOMI, mmm  Chest:   Decreased , distance, no rhonchi or rales CVS: regular, distant, S1, S2 Abdomen:obese, soft, not tender positive bowel sounds Ext: right arm in sling, good radial pulse, good purfusion Neuro:awake , alert oriented , CN II-XII intact  Labs: CBC    Component Value Date/Time   WBC 6.8 01/12/2014 0535   WBC 7.2 12/30/2013 1133   RBC 4.01 01/12/2014 0535   RBC 4.23 12/30/2013 1133   RBC 3.18* 09/26/2012 0518   HGB 10.0* 01/12/2014 0535   HGB 10.4* 12/30/2013 1133   HCT 31.5* 01/12/2014 0535   HCT 33.0* 12/30/2013 1133   PLT 322 01/12/2014 0535   PLT 288 12/30/2013 1133   MCV 78.6 01/12/2014 0535   MCV 77.9* 12/30/2013 1133   MCH 24.9* 01/12/2014 0535   MCH 24.7* 12/30/2013 1133   MCHC 31.7 01/12/2014 0535   MCHC 31.6 12/30/2013 1133   RDW 18.2* 01/12/2014 0535   RDW 17.9* 12/30/2013 1133   LYMPHSABS 0.7 01/10/2014 1355   LYMPHSABS  0.9 12/30/2013 1133   MONOABS 0.6 01/10/2014 1355   MONOABS 0.5 12/30/2013 1133   EOSABS 0.1 01/10/2014 1355   EOSABS 0.2 12/30/2013 1133   BASOSABS 0.0 01/10/2014 1355   BASOSABS 0.0 12/30/2013 1133     CMP     Component Value  Date/Time   NA 139 01/12/2014 0535   NA 143 12/30/2013 1133   K 3.4* 01/12/2014 0535   K 4.0 12/30/2013 1133   CL 103 01/12/2014 0535   CL 101 02/11/2013 0950   CO2 21 01/12/2014 0535   CO2 27 12/30/2013 1133   GLUCOSE 126* 01/12/2014 0535   GLUCOSE 148* 12/30/2013 1133   GLUCOSE 143* 02/11/2013 0950   BUN 38* 01/12/2014 0535   BUN 31.9* 12/30/2013 1133   CREATININE 1.38* 01/12/2014 0535   CREATININE 1.5* 12/30/2013 1133   CREATININE 1.25* 03/30/2013 1558   CALCIUM 7.7* 01/12/2014 0535   CALCIUM 11.9* 12/30/2013 1133   CALCIUM 6.6* 09/29/2012 1118   PROT 7.0 01/10/2014 1355   PROT 6.5 12/30/2013 1133   ALBUMIN 3.5 01/10/2014 1355   ALBUMIN 3.4* 12/30/2013 1133   AST 18 01/10/2014 1355   AST 23 12/30/2013 1133   ALT 24 01/10/2014 1355   ALT 32 12/30/2013 1133   ALKPHOS 147* 01/10/2014 1355   ALKPHOS 136 12/30/2013 1133   BILITOT 0.4 01/10/2014 1355   BILITOT 0.34 12/30/2013 1133   GFRNONAA 39* 01/12/2014 0535   GFRAA 45* 01/12/2014 0535    Chest Xray Reviewed/Impressions: 1. No active cardiopulmonary disease.  2. Multi focal osseous metastatic disease.    CT scan of the Head Reviewed/Impressions: 1. Minimally increased size of a right level IV lymph node.  2. Slightly decreased conspicuity of right vocal cord nodularity  described on the prior study with unchanged asymmetry of the  overlying thyroid cartilage.  3. Slightly increased size of left upper lobe lung nodule.  4. Multiple lytic lesions in the visualized spine, new/increased  from the prior study and concerning for progressive osseous  metastatic disease.      Time In Time Out Total Time Spent with Patient Total Overall Time  430 pm 545 pm 75 min 75 min    Greater than 50%  of this time was spent counseling and coordinating  care related to the above assessment and plan.   Darryn Kydd L. Lovena Le, MD MBA The Palliative Medicine Team at [facility] Phone: [phone] Pager: [phone]

## 2014-01-12 NOTE — Progress Notes (Signed)
Occupational Therapy Treatment Patient Details Name: Anita [name] MRN: [id number] DOB: Jul 27, 1946 Today's Date: 01/12/2014    History of present illness pt presents with FTT at home since having R Humerus fx (01/03/14).  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     OT comments  Pt. Tolerating ROM and edema management through positioning and massage to R hand and digits.  Follow Up Recommendations  SNF                Precautions / Restrictions Precautions Precautions: Fall Precaution Comments: broken humerus that they are trying to treat conservatively Required Braces or Orthoses: Sling Restrictions Weight Bearing Restrictions: Yes RUE Weight Bearing: Non weight bearing                                                                                                                                                                         Exercises Other Exercises Other Exercises: Had her perform composite flexion/extension of digits RUE and issued her a squeeze ball. Made her aware that she needs to continue to move her fingers to help with edema control Other Exercises: demonstrated self retrograde massage to right digits with use of lotion to aide in massage technique.  pt. able to return demo Other Exercises: provided instructions and demonstration of how to don sling properly and also postioning in chair for edema reduction Donning/doffing sling/immobilizer: Maximal assistance Correct positioning of sling/immobilizer: Maximal assistance   Shoulder Instructions Shoulder Instructions Donning/doffing sling/immobilizer: Maximal assistance Correct positioning of sling/immobilizer: Maximal assistance     General Comments  still requires instruction for donning sling properly and positioning of ue for edema management    Pertinent Vitals/ Pain       No c/o pain                                                           Frequency Min 3X/week     Progress Toward Goals  OT Goals(current goals can now be found in the care plan section)  Progress towards OT goals: Progressing toward goals     Plan Discharge plan remains appropriate                     End of Session     Activity Tolerance Patient tolerated treatment well   Patient Left in chair;with call bell/phone within reach;with family/visitor present             Time: 9937-1696 OT Time Calculation (min): 18 min  Charges: OT General  Charges $OT Visit: 1 Procedure OT Treatments $Therapeutic Exercise: 8-22 mins  Rico Junker Aracelly Tencza, COTA/L 01/12/2014, 11:10 AM

## 2014-01-12 NOTE — Progress Notes (Signed)
Family Medicine Teaching Service Daily Progress Note Intern Pager: [phone]  Patient name: Anita [name] Medical record number: [id number] Date of birth: 04-Aug-1946 Age: 68 y.o. Gender: female  Primary Care Provider: [name], DO Consultants: Palliative medicine Code Status: Full  Pt Overview and Major Events to Date:   Assessment and Plan: Anita [name] is a 68 y.o. female presenting with worsening weakness 2/2 metastatic breast CA to bone . PMH is significant for Metastatic Mammary CA w/ bone metastasis, HLD, CAD (NSTEMI x 3, most recent 2014 with cath in November 2014 showing RCA/LAD occlusion ), CKD, Chronic Systolic/Diastolic CHF, COPD, type II DM , anemia of chornic disease, Hx of tobacco abuse.   # Generalized Weakness, with Hx Metastatic (bone) mammary carcinoma and laryngeal carcinoma - Prior to admission, pt with recent fx of the R humerus about 1-2 weeks ago now, unable to care for herself at home with significant declining function / inability to be cared for at home, worsening functional status d/t weakness. On admission, no new findings on lab work or CXR and denies any new complaints.  - [place] meeting scheduled 5/19 (4:00 to 4:30pm), Daughter and Spouse to be present - PT/OT - Recommend SNF - Continue home Vicodin, Vitamin D - Unable to continue home Palbociclib 125mg  daily PO (not available inpatient pharmacy), rx'd to continue x 21 days (however not started yet d/t ordered at local pharmacy, has not received supply yet), would need to bring from home once obtained. - Discussed case with Dr. Juliann Mule (Heme/Onc), agrees with continuing current PO regimen Femara and Palbociclib for continued palliative management, understands recent progression with worsening (decreased functional status at home), however reports until recently pt had previously been quite functional. Given multiple additional co-morbidities, general estimate of prognosis at several months  to 1-2 years, confirmed all current treatments are palliative and not curative, would support decisions of [facility] if patient plans for palliative / hospice, otherwise would continue current anti-estrogen PO regimen, and follow-up outpatient.  # AKI, suspected secondary to pre-renal dehydration, in setting of CKD Stage II-III - Serum creatinine and BUN increasing since admission, above baseline Cr (1.00-1.2) - Cr 1.29-->1.38 and BUN 35-->38, with BUN:Cr (27.5) suggestive of pre-renal etiology - Hold Losartan, Lasix - Improve PO intake, continue SLIV. Consider gentle IVF rehydration if needed (cautious with CHF)  # HTN - Currently low-normotensive, recent BPs 100/40-50s - Suspect pt mildly dehydrated and intravascularly depleted, however cautious with CHF - Monitor BPs today, consider IVF if needed for persistent hypotension - Hold Losartan, Lasix today - Plan to reduce Imdur from 60mg  to 30mg  starting tomorrow  # Hx of Combined Systolic/Diastolic CHF - On admission, ProBNP (379), likely at baseline, and CXR does not show pulmonary edema  - Currently dry vs euvolemic - Has seen cardiology and her PCP, Dr. Lacinda Axon and does not want addition of new medications  - Avoid Beta blocker 2/2 underlying COPD so continue Imdur   # Hx of CAD (PCI 06/2013 showing 20% occlusion of LCx, 40% occlusion of LAD with 85% occlusion of first diagonal, RCA with chronic disease, and EF of 30-35%)  - Continue medical management with ASA, Imdur - Consider DC statin on discharge to SNF  # Hx of COPD  - Continue home oxygen and medications   #DM II, controlled  - Last A1C 7.0 (12/04/13) - Hold home metformin, Sensitive SSI - Anticipate discharge to SNF without rx to continue insulin or Metformin, recommend daily CBG (no therapy  unless consistently >250)  # Mild Hypokalemia - Stable - s/p replete K+ with 3x Kdur 20 mEq  - K 3.3-->3.4-->3.4 - Mg 2.0  FEN/GI: SLIV, Regular Diet  Prophylaxis: SQ heparin    Disposition: Admitted to FPTS due to generalized weakness with significant hx metastatic CA, PT/OT recommendations for SNF placement, pending Palliative GOC meeting, will determine disposition.  Subjective: Sitting up in chair at bedside, R-arm remains in sling. Overall mostly unchanged today. Reports that both legs still swollen (more than usual), and R-arm still painful, otherwise denies any acute complaints. Awaiting [facility] meeting today from 4 to 4:30pm, agreeable to potential SNF placement. Denies CP, SOB, fevers/chills.  Objective: Temp:  [97.4 F (36.3 C)-98.5 F (36.9 C)] 97.4 F (36.3 C) (05/18 2046) Pulse Rate:  [80-92] 80 (05/18 2046) Resp:  [16] 16 (05/18 2046) BP: (100-125)/(44-50) 100/46 mmHg (05/18 2046) SpO2:  [97 %-98 %] 98 % (05/18 2046) Physical Exam: General: NAD, sitting up in chair at bedside, appears pleasant Cardiovascular: RRR, S1 / S2, no murmurs heard Respiratory: mild bibasilar crackles, normal effort Abdomen: soft, obese, NTND, normal bowel sounds Extremities: R-arm in sling, persistent 1-2+ pitting edema bilaterally ankle to below knee, without erythema or tenderness on palpation Neuro: alert and oriented. No focal deficits.  Laboratory:  Recent Labs Lab 01/10/14 1355 01/11/14 0642  WBC 7.3 7.0  HGB 10.2* 10.2*  HCT 31.0* 31.7*  PLT 343 340    Recent Labs Lab 01/10/14 1355 01/11/14 0642  NA 140 141  K 3.3* 3.4*  CL 102 101  CO2 25 25  BUN 31* 35*  CREATININE 1.24* 1.29*  CALCIUM 8.0* 7.8*  PROT 7.0  --   BILITOT 0.4  --   ALKPHOS 147*  --   ALT 24  --   AST 18  --   GLUCOSE 136* 144*   Pro BNP 379.4 Mag 2.0  Imaging/Diagnostic Tests: Dg Chest 2 View  01/09/2014   CLINICAL DATA:  Arm injury.  EXAM: CHEST  2 VIEW  COMPARISON:  07/06/2013  FINDINGS: There is chronic cardiomegaly. Unchanged mediastinal contours. No edema, consolidation, effusion, or pneumothorax. Evidence of bony metastatic disease with lytic lesions in the right  acromion, right clavicle, surgical neck of the left humerus, anterior left second rib, posterior left seventh rib, distal right humerus, and left glenoid.  IMPRESSION: 1. No active cardiopulmonary disease. 2. Multi focal osseous metastatic disease.   Electronically Signed   By: [name] M.D.   On: 01/09/2014 13:10    Nobie Putnam, DO 01/12/2014, 12:22 AM PGY-1, [place] Intern pager: [phone], text pages welcome

## 2014-01-13 ENCOUNTER — Ambulatory Visit: Payer: Medicare HMO

## 2014-01-13 ENCOUNTER — Ambulatory Visit: Payer: Medicare Other | Admitting: Radiation Oncology

## 2014-01-13 ENCOUNTER — Ambulatory Visit: Payer: Medicare Other

## 2014-01-13 ENCOUNTER — Other Ambulatory Visit: Payer: Medicare HMO

## 2014-01-13 DIAGNOSIS — R5381 Other malaise: Secondary | ICD-10-CM | POA: Diagnosis not present

## 2014-01-13 DIAGNOSIS — R5383 Other fatigue: Secondary | ICD-10-CM | POA: Diagnosis not present

## 2014-01-13 LAB — CBC
HEMATOCRIT: 33.4 % — AB (ref 36.0–46.0)
Hemoglobin: 10.5 g/dL — ABNORMAL LOW (ref 12.0–15.0)
MCH: 24.6 pg — ABNORMAL LOW (ref 26.0–34.0)
MCHC: 31.4 g/dL (ref 30.0–36.0)
MCV: 78.2 fL (ref 78.0–100.0)
PLATELETS: 309 10*3/uL (ref 150–400)
RBC: 4.27 MIL/uL (ref 3.87–5.11)
RDW: 18.2 % — AB (ref 11.5–15.5)
WBC: 6.1 10*3/uL (ref 4.0–10.5)

## 2014-01-13 LAB — GLUCOSE, CAPILLARY
GLUCOSE-CAPILLARY: 121 mg/dL — AB (ref 70–99)
GLUCOSE-CAPILLARY: 119 mg/dL — AB (ref 70–99)
Glucose-Capillary: 123 mg/dL — ABNORMAL HIGH (ref 70–99)

## 2014-01-13 LAB — BASIC METABOLIC PANEL
BUN: 34 mg/dL — AB (ref 6–23)
CALCIUM: 8.7 mg/dL (ref 8.4–10.5)
CHLORIDE: 105 meq/L (ref 96–112)
CO2: 24 mEq/L (ref 19–32)
CREATININE: 1.31 mg/dL — AB (ref 0.50–1.10)
GFR calc non Af Amer: 41 mL/min — ABNORMAL LOW (ref >90)
GFR, EST AFRICAN AMERICAN: 48 mL/min — AB (ref >90)
Glucose, Bld: 126 mg/dL — ABNORMAL HIGH (ref 70–99)
Potassium: 4.2 mEq/L (ref 3.7–5.3)
Sodium: 141 mEq/L (ref 137–147)

## 2014-01-13 MED ORDER — POTASSIUM CHLORIDE CRYS ER 20 MEQ PO TBCR: 20.0000 meq | EXTENDED_RELEASE_TABLET | Freq: Two times a day (BID) | ORAL | Status: DC

## 2014-01-13 MED ORDER — HYDROCODONE-ACETAMINOPHEN 5-325 MG PO TABS: 2.0000 | ORAL_TABLET | ORAL | Status: DC | PRN

## 2014-01-13 MED ORDER — FUROSEMIDE 40 MG PO TABS: 40.0000 mg | ORAL_TABLET | Freq: Every day | ORAL | Status: DC

## 2014-01-13 MED ORDER — LETROZOLE 2.5 MG PO TABS: 2.5000 mg | ORAL_TABLET | Freq: Every day | ORAL | Status: DC

## 2014-01-13 MED ORDER — MIRTAZAPINE 15 MG PO TABS: 15.0000 mg | ORAL_TABLET | Freq: Every day | ORAL | Status: DC

## 2014-01-13 MED ORDER — MIRTAZAPINE 15 MG PO TABS
15.0000 mg | ORAL_TABLET | Freq: Every day | ORAL | Status: DC
  Filled 2014-01-13: qty 1

## 2014-01-13 MED ORDER — ISOSORBIDE MONONITRATE ER 30 MG PO TB24: 30.0000 mg | ORAL_TABLET | Freq: Every day | ORAL | Status: DC

## 2014-01-13 MED ORDER — ATORVASTATIN CALCIUM 40 MG PO TABS
40.0000 mg | ORAL_TABLET | Freq: Every day | ORAL | Status: DC
Start: 2014-01-13 — End: 2014-05-04

## 2014-01-13 NOTE — Progress Notes (Signed)
Clinical social worker assisted with patient discharge to skilled nursing facility, [facility].  CSW addressed all family questions and concerns. CSW copied chart and added all important documents. CSW also set up patient transportation with [facility]. Clinical Social Worker will sign off for now as social work intervention is no longer needed.   Tiajah Oyster, MSW, LCSWA [phone] 

## 2014-01-13 NOTE — Progress Notes (Signed)
Physical Therapy Note  Late entry for missed G-codes.     Jan 24, 2014 0805  PT Visit Information  Last PT Received On 01-26-14  PT G-Codes **NOT FOR INPATIENT CLASS**  Functional Assessment Tool Used Clinical Judgement  Functional Limitation Mobility: Walking and moving around  Mobility: Walking and Moving Around Current Status [phone]) CI  Mobility: Walking and Moving Around Goal Status [phone]) [place], [place]

## 2014-01-13 NOTE — Discharge Summary (Signed)
[facility] Discharge Summary  Patient name: Anita [name] Medical record number: [id number] Date of birth: [birth year] Age: 68 y.o. 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 Right Humerus Fracture, closed, suspected secondary to bone metastasis AKI, secondary to dehydration, in setting of CKD Stage II-III - Improved HTN - Stable Diabetes, Type 2 - Controlled H/o of Combined Systolic/Diastolic CHF - Stable H/o CAD H/o COPD Mild Hypokalemia - Resolved Decreased Appetite with poor PO intake  Disposition: Skilled Nursing Facility ([facility])  Discharge Condition: Stable  Discharge Exam: BP 100/50  Pulse 76  Temp(Src) 97.9 F (36.6 C) (Oral)  Resp 14  Ht 5\' 2"  (1.575 m)  Wt 234 lb (106.142 kg)  BMI 42.79 kg/m2  SpO2 91%  General: NAD, sitting up in chair at bedside, appears pleasant  Cardiovascular: RRR, S1 / S2, no murmurs heard  Respiratory: mild bibasilar crackles, normal effort  Abdomen: soft, obese, NTND, normal bowel sounds  Extremities: R-arm in sling, improved 1-2+ pitting edema bilaterally ankle to below knee, without erythema or tenderness on palpation  Neuro: alert and oriented. No focal deficits.  Brief Hospital Course:  Anita [name] is a 68 y.o. female who presented with worsening weakness 2/2 metastatic breast CA to bone, and associated Right humerus fracture. PMH is significant for Metastatic Mammary CA w/ bone metastasis, HLD, CAD (NSTEMI x 3, most recent 2014 with cath in November 2014 showing RCA/LAD occlusion ), CKD, Chronic Systolic/Diastolic CHF, COPD, type II DM  , anemia of chornic disease, Hx of tobacco abuse.  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 On admission, no new findings on lab work or CXR and denies any new complaints. Right arm remained in sling from prior diagnosis, reviewed X-ray, continued pain management and vitamin D. Consulted PT/OT/CSW, recommend SNF placement. Consulted Palliative Care, arranged Goals of Care meeting on 01/12/14 (with family, including Husband, Daughter, and Son) decisions made to continue with current treatment, remain "Full Code", proceed to SNF for short-term rehab, arrange advanced directives. Contacted Dr. Juliann Mule (primary Heme/Onc) agree with rehab, plans to continue to follow outpatient, recommend continue Femara and Palbociclib (unable to obtain in hospital pharmacy, pt has special ordered, plans to bring to SNF to take)  # AKI, secondary to dehydration, in setting of CKD Stage II-III - Improved On admission, found to have elevated serum creatinine and BUN, above baseline Cr (1.00-1.2), initially with high BUN:Cr 27.5 suggestive of pre-renal etiology, likely dehydration. Held nephrotoxic agents Losartan and Lasix. Given concern with hx CHF, primarily improved PO intake and monitored BMETs with decreasing Cr trend on discharge from 1.38 to 1.31.  # H/o of Combined Systolic/Diastolic CHF - Stable # HTN - Stable On admission, ProBNP (379), likely at baseline, and CXR does not show pulmonary edema. Appeared to be dry vs euvolemic, with chronic bilateral LE edema. Stable low-normal BP on admission 110s/40-50s. Continued to monitor  with decreasing trend, suspected  d/t dehydration and hypovolemia. Held anti-HTN agents and improved PO intake with noted improvement and stabilization of BP. Recommend to discontinue Losartan on discharge, reduced Lasix dose to 40mg  daily (from 80mg , may increase if needed due to inc wt or edema), half Imdur dose (from 60mg ) to 30mg  daily.  # Hx of CAD (PCI 06/2013 showing 20% occlusion of LCx, 40% occlusion of LAD with 85% occlusion of first diagonal, RCA with chronic disease, and EF of 30-35%). Continue medical management with ASA, Imdur. Discontinue statin on discharge.  # Hx of COPD - Stable Continued home oxygen and medications.  # Diabetes, Type 2 - Controlled Last A1C 7.0 (12/04/13). During hospitalization, held home metformin, maintained CBGs on sensitive SSI with good results. Discharge to SNF without rx to continue insulin or Metformin, recommend daily CBG (no therapy unless consistently >250).  # Mild Hypokalemia - Resolved   # Decreased Appetite  Start Remeron 15mg  nightly  Issues for Follow Up:  1. Continued PT/OT short-term rehab, stabilization and adjustment with R-humerus fracture 2. BP monitoring - high risk for hypotension 3. Fluid monitoring - euvolemic on discharge 4. Medication changes:   - Discontinue Losartan (may resume once completed short-term rehab and re-evaluated by PCP)   - Reduced Lasix to 40mg  PO (from 80mg ) daily. Avoid nephrotoxicity and reduce intravascular depletion, monitor daily weights, and clinically for edema or SOB. May resume 80mg  daily if needed   - Reduced Imdur to 30mg  (from 60mg ) daily. Low BP during hospitalization   - Discontinued Metformin and Insulin 5. Diabetes - Daily CBG monitoring as long as < 250, continue without treatment. May restart Metformin if persistently elevated CBGs >250 6. Palbociclib 125mg  PO daily (Oncology breast cancer treatment) - will need to pick up from outside pharmacy and family to bring rx for patient to take for 21 days.  Significant  Procedures: none  Significant Labs and Imaging:   Recent Labs Lab 01/11/14 0642 01/12/14 0535 01/13/14 0625  WBC 7.0 6.8 6.1  HGB 10.2* 10.0* 10.5*  HCT 31.7* 31.5* 33.4*  PLT 340 322 309    Recent Labs Lab 01/10/14 1355 01/10/14 2119 01/11/14 0642 01/12/14 0535 01/13/14 0625  NA 140  --  141 139 141  K 3.3*  --  3.4* 3.4* 4.2  CL 102  --  101 103 105  CO2 25  --  25 21 24   GLUCOSE 136*  --  144* 126* 126*  BUN 31*  --  35* 38* 34*  CREATININE 1.24*  --  1.29* 1.38* 1.31*  CALCIUM 8.0*  --  7.8* 7.7* 8.7  MG  --  2.0  --  2.5  --   ALKPHOS 147*  --   --   --   --   AST 18  --   --   --   --   ALT 24  --   --   --   --   ALBUMIN 3.5  --   --   --   --    Pro BNP 379.4  Mag 2.0  Dg Chest 2 View  01/09/2014 CLINICAL DATA: Arm injury. EXAM: CHEST 2 VIEW COMPARISON: 07/06/2013 FINDINGS: There is chronic cardiomegaly. Unchanged mediastinal contours. No edema, consolidation, effusion, or pneumothorax. Evidence of bony metastatic disease with lytic lesions in the right acromion, right clavicle, surgical neck of the left humerus, anterior left second rib, posterior left seventh rib, distal right humerus, and left glenoid. IMPRESSION: 1. No active cardiopulmonary  disease. 2. Multi focal osseous metastatic disease. Electronically Signed By: [name] M.D. On: 01/09/2014 13:10   Results/Tests Pending at Time of Discharge: none  Discharge Medications:    Medication List    STOP taking these medications       losartan 25 MG tablet  Commonly known as:  COZAAR     metFORMIN 500 MG tablet  Commonly known as:  GLUCOPHAGE      TAKE these medications       albuterol 108 (90 BASE) MCG/ACT inhaler  Commonly known as:  PROVENTIL HFA;VENTOLIN HFA  Inhale 1 puff into the lungs 2 (two) times daily as needed for wheezing or shortness of breath.     aspirin 81 MG EC tablet  Take 1 tablet (81 mg total) by mouth daily.     atorvastatin 40 MG tablet  Commonly known as:   LIPITOR  Take 1 tablet (40 mg total) by mouth daily at 6 PM.     furosemide 40 MG tablet  Commonly known as:  LASIX  Take 1 tablet (40 mg total) by mouth daily.     HYDROcodone-acetaminophen 5-325 MG per tablet  Commonly known as:  NORCO/VICODIN  Take 2 tablets by mouth every 4 (four) hours as needed for moderate pain.     IBRANCE 125 MG capsule  Generic drug:  palbociclib  Take 125 mg by mouth daily with breakfast. Take whole with food.     isosorbide mononitrate 30 MG 24 hr tablet  Commonly known as:  IMDUR  Take 1 tablet (30 mg total) by mouth daily.     letrozole 2.5 MG tablet  Commonly known as:  FEMARA  Take 1 tablet (2.5 mg total) by mouth daily.     mirtazapine 15 MG tablet  Commonly known as:  REMERON  Take 1 tablet (15 mg total) by mouth at bedtime.     nitroGLYCERIN 0.4 MG SL tablet  Commonly known as:  NITROSTAT  Place 1 tablet (0.4 mg total) under the tongue every 5 (five) minutes as needed for chest pain.     NON FORMULARY  Place 3 L into the nose as needed (oxygen).     potassium chloride SA 20 MEQ tablet  Commonly known as:  K-DUR,KLOR-CON  Take 1-2 tablets (20-40 mEq total) by mouth 2 (two) times daily. 2 tablets (40 mEq) AM and 1 tablet ( 20 mEq) PM     Umeclidinium-Vilanterol 62.5-25 MCG/INH Aepb  Inhale 1 puff into the lungs as needed (for COPD).     VITAMIN D (CHOLECALCIFEROL) PO  Take 1 capsule by mouth daily.        Discharge Instructions: Please refer to Patient Instructions section of EMR for full details.  Patient was counseled important signs and symptoms that should prompt return to medical care, changes in medications, dietary instructions, activity restrictions, and follow up appointments.   Follow-Up Appointments: Follow-up Information   Follow up with [name], DO. Schedule an appointment as soon as possible for a visit in 1 month. (Please call clinic to schedule a hospital follow-up appointment in 1 month after completed  Short-Term Rehab at [facility].)    Specialty:  Family Medicine   Contact information:   [place] [address] [phone]       Langston Masker, MD 01/13/2014, 3:57 PM PGY-1, [place]

## 2014-01-13 NOTE — Progress Notes (Signed)
Chaplain received referral from [facility] to provide AD documents to patient. Patient was alert and expressed understanding of documents. Pt's daughter will assist her in completing them. Chaplain available to answer questions and assist with notarization between 9am-4:30pm, Monday-Friday.   Ethelene Browns [phone]

## 2014-01-13 NOTE — Discharge Instructions (Signed)
You were hospitalized due to generalized weakness, believed to be due to multiple factors including metastatic breast cancer and right arm fracture. Additionally, you were found to be dehydrated, and required IV fluid rehydration. We held some of your blood pressure medications due to low pressures and to allow your kidneys improve function. - We have made arrangements for you to receive Short-Term Therapy at a [facility] - Important to stay well hydrated and improve oral intake of food and drink, we have also started a medicine Remeron (15mg  nightly) to help improve your appetite - For your Diabetes, we recommend just checking blood sugar, but will hold your insulin and Metformin at this time - Palliative Care has discussed goals of care with your family and our medical team. We respect your wishes, and plan to proceed with all therapy and current treatments. Additionally, we have discussed everything with Dr. Juliann Mule and he plans to continue to see you and treat your cancer. - Please call Dr. Boyce Medici office to arrange follow-up appointment, also please pick up the medication special ordered - "Palbociclib" and bring it to the [facility] to be given - Please call [facility] to schedule a follow-up appointment in about 1 month

## 2014-01-13 NOTE — Progress Notes (Signed)
Physical Therapy Treatment Patient Details Name: Anita [name] MRN: [id number] DOB: [birth year] Today's Date: 01/13/2014    History of Present Illness pt presents with FTT at home since having R Humerus fx (01/03/14).  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      PT Comments    Patient progressing well with ambulation. Still some small overall imbalances with mobility from baseline but overall Min A for safety. Continue to recommend SNF for ongoing Physical Therapy.     Follow Up Recommendations  SNF     Equipment Recommendations  None recommended by PT    Recommendations for Other Services       Precautions / Restrictions Precautions Precautions: Fall Precaution Comments: broken distal humerus that they are trying to treat conservatively Required Braces or Orthoses: Sling Restrictions Weight Bearing Restrictions: Yes RUE Weight Bearing: Non weight bearing    Mobility  Bed Mobility                  Transfers Overall transfer level: Needs assistance Equipment used: Straight cane Transfers: Sit to/from Stand Sit to Stand: Min assist         General transfer comment: cues for safe technique  Ambulation/Gait Ambulation/Gait assistance: Min guard Ambulation Distance (Feet): 120 Feet Assistive device: Straight cane Gait Pattern/deviations: Step-through pattern;Shuffle;Decreased stride length     General Gait Details: Patient continues with shuffle gait. Cues to pick up feet with ambulation. Patient required two standing rest breaks with ambulation this session.    Stairs            Wheelchair Mobility    Modified Rankin (Stroke Patients Only)       Balance             Standing balance-Leahy Scale: Fair Standing balance comment: Can stand without use of UE but not with challenges to balance                    Cognition Arousal/Alertness: Awake/alert Behavior During Therapy: WFL for tasks assessed/performed Overall  Cognitive Status: Within Functional Limits for tasks assessed Area of Impairment: Attention;Memory;Safety/judgement;Awareness   Current Attention Level: Sustained Memory: Decreased short-term memory;Decreased recall of precautions   Safety/Judgement: Decreased awareness of safety;Decreased awareness of deficits Awareness: Emergent        Exercises Other Exercises Other Exercises: Educated on importance of performing AROM of fingers and/or squeeze ball hourly. Donning/doffing sling/immobilizer: Maximal assistance Correct positioning of sling/immobilizer: Maximal assistance    General Comments        Pertinent Vitals/Pain Denied pain    Home Living                      Prior Function            PT Goals (current goals can now be found in the care plan section) Progress towards PT goals: Progressing toward goals    Frequency  Min 3X/week    PT Plan Current plan remains appropriate    Co-evaluation             End of Session Equipment Utilized During Treatment: Gait belt Activity Tolerance: Patient tolerated treatment well Patient left: in chair;with call bell/phone within reach     Time: 8101-7510 PT Time Calculation (min): 17 min  Charges:  $Gait Training: 8-22 mins                    G Codes:  Tonia Brooms Kennia Vanvorst 01/13/2014, 11:58 AM 01/13/2014 [place] PTA [phone] pager [phone] office

## 2014-01-13 NOTE — Progress Notes (Signed)
Family Medicine Teaching Service Daily Progress Note Intern Pager: [phone]  Patient name: Anita [name] Medical record number: [id number] Date of birth: 05/19/1946 Age: 68 y.o. Gender: female  Primary Care Provider: [name], DO Consultants: Palliative medicine Code Status: Full  Pt Overview and Major Events to Date:   Assessment and Plan: Anita [name] is a 68 y.o. female presenting with worsening weakness 2/2 metastatic breast CA to bone . PMH is significant for Metastatic Mammary CA w/ bone metastasis, HLD, CAD (NSTEMI x 3, most recent 2014 with cath in November 2014 showing RCA/LAD occlusion ), CKD, Chronic Systolic/Diastolic CHF, COPD, type II DM , anemia of chornic disease, Hx of tobacco abuse.   # Generalized Weakness, with Hx Metastatic (bone) mammary carcinoma and laryngeal carcinoma - Prior to admission, pt with recent fx of the R humerus about 1-2 weeks ago now, unable to care for herself at home with significant declining function / inability to be cared for at home, worsening functional status d/t weakness. On admission, no new findings on lab work or CXR and denies any new complaints. - [place] meeting on 01/12/14 (Husband, Daughter, Son, present), see consult note for complete details. Recommendations:         - 1) Full Code        - 2) Transition to SNF        - 3) Continue treatment with Dr. Juliann Mule        - 4) Complete Medical POA in house, other ADs can be completed by [name] - (pending CSW arrangements and bed placement) - Continue home Vicodin, Vitamin D - Unable to continue home Palbociclib 125mg  daily PO (not available inpatient pharmacy), rx'd to continue x 21 days (however not started yet d/t ordered at local pharmacy, has not received supply yet), would need to bring from home once obtained. - Discussed case with Dr. Juliann Mule (Heme/Onc) on 5/19, agrees with continuing current PO regimen Femara and  Palbociclib for continued palliative management, understands recent progression with worsening (decreased functional status at home), however reports until recently pt had previously been quite functional. Given multiple additional co-morbidities, general estimate of prognosis at several months to 1-2 years, confirmed all current treatments are palliative and not curative. Plan to continue treatment and f/u outpatient.  # AKI, suspected secondary to pre-renal dehydration, in setting of CKD Stage II-III - Serum creatinine and BUN increasing since admission, above baseline Cr (1.00-1.2) - Cr 1.29-->1.38-->1.31 and BUN 35-->38-->34, with BUN:Cr (27.5) suggestive of pre-renal etiology - Hold Losartan, Lasix - Improve PO intake, continue SLIV. Consider gentle IVF rehydration if needed (cautious with CHF)  # HTN - Currently improved to 100-110s, from low-normotensive yesterday 100s/50s - Improved BP with rehydration - Continue monitor BP - Hold Losartan, Lasix - Reduced Imdur from 60mg  to 30mg  today  # Hx of Combined Systolic/Diastolic CHF - On admission, ProBNP (379), likely at baseline, and CXR does not show pulmonary edema  - Currently dry vs euvolemic - Has seen cardiology and her PCP, Dr. Lacinda Axon and does not want addition of new medications  - Avoid Beta blocker 2/2 underlying COPD so continue Imdur (half dose)  # Hx of CAD (PCI 06/2013 showing 20% occlusion of LCx, 40% occlusion of LAD with 85% occlusion of first diagonal, RCA with chronic disease, and EF of 30-35%)  - Continue medical management with ASA, Imdur - Consider DC statin on discharge to SNF  # Hx  of COPD  - Continue home oxygen and medications   #DM II, controlled  - Last A1C 7.0 (12/04/13) - Hold home metformin, Sensitive SSI - Anticipate discharge to SNF without rx to continue insulin or Metformin, recommend daily CBG (no therapy unless consistently >250)  # Mild Hypokalemia - Resolved - s/p replete K+ with 3x Kdur 20  mEq, 40 m Eq x 1 - K 3.3-->3.4-->3.4-->4.2 - Mg 2.0  # Decreased Appetite - Start Remeron 15mg  nightly  FEN/GI: SLIV, Regular Diet  Prophylaxis: SQ heparin   Disposition: Admitted to FPTS due to generalized weakness with significant hx metastatic CA, PT/OT recommendations for SNF placement, following Palliative GOC meeting plan to proceed with SNF placement, remains Full Code, continue Oncology management outpatient. Currently patient is medically cleared and stable for discharge to SNF, when arrangements and placement completed.  Subjective: Sitting up in chair at bedside, R-arm remains in sling. Pleasant, no acute complaints. Admits meeting yesterday was successful but difficult. Remains agreeable to SNF. Eager to ambulate, tolerating PO but decreased appetite.  Objective: Temp:  [97.7 F (36.5 C)-98 F (36.7 C)] 97.9 F (36.6 C) (05/20 0328) Pulse Rate:  [76-93] 76 (05/20 0328) Resp:  [14] 14 (05/20 0328) BP: (100-114)/(49-52) 100/50 mmHg (05/20 0328) SpO2:  [91 %-98 %] 91 % (05/20 0328) Physical Exam: General: NAD, sitting up in chair at bedside, appears pleasant Cardiovascular: RRR, S1 / S2, no murmurs heard Respiratory: mild bibasilar crackles, normal effort Abdomen: soft, obese, NTND, normal bowel sounds Extremities: R-arm in sling, improved 1-2+ pitting edema bilaterally ankle to below knee, without erythema or tenderness on palpation Neuro: alert and oriented. No focal deficits.  Laboratory:  Recent Labs Lab 01/11/14 [phone] 01/12/14 0535 01/13/14 0625  WBC 7.0 6.8 6.1  HGB 10.2* 10.0* 10.5*  HCT 31.7* 31.5* 33.4*  PLT 340 322 309    Recent Labs Lab 01/10/14 1355 01/11/14 0642 01/12/14 0535 01/13/14 0625  NA 140 141 139 141  K 3.3* 3.4* 3.4* 4.2  CL 102 101 103 105  CO2 25 25 21 24   BUN 31* 35* 38* 34*  CREATININE 1.24* 1.29* 1.38* 1.31*  CALCIUM 8.0* 7.8* 7.7* 8.7  PROT 7.0  --   --   --   BILITOT 0.4  --   --   --   ALKPHOS 147*  --   --   --   ALT  24  --   --   --   AST 18  --   --   --   GLUCOSE 136* 144* 126* 126*   Pro BNP 379.4 Mag 2.0  Imaging/Diagnostic Tests: Dg Chest 2 View  01/09/2014   CLINICAL DATA:  Arm injury.  EXAM: CHEST  2 VIEW  COMPARISON:  07/06/2013  FINDINGS: There is chronic cardiomegaly. Unchanged mediastinal contours. No edema, consolidation, effusion, or pneumothorax. Evidence of bony metastatic disease with lytic lesions in the right acromion, right clavicle, surgical neck of the left humerus, anterior left second rib, posterior left seventh rib, distal right humerus, and left glenoid.  IMPRESSION: 1. No active cardiopulmonary disease. 2. Multi focal osseous metastatic disease.   Electronically Signed   By: [name] M.D.   On: 01/09/2014 13:10    Nobie Putnam, DO 01/13/2014, 11:36 AM PGY-1, [place] Intern pager: [phone], text pages welcome

## 2014-01-13 NOTE — Progress Notes (Signed)
Chaplain talked with pt per consult.  Pt is going through what seems like a major life transition in rehab and has some doubts as to what her quality of life will be after rehab.  Chaplain listened to pt and talked with her regarding her expectations and the premise for her concerns.  It appears that the pt might have underlying feelings regarding her diagnosis of bone cancer in relation to how she is treated by her family.  Pt mentioned, "I aint dead yet, but they are treating me like I am."  It appears that pt is challenged with the way her family is responding to her diagnosis.  Chaplain ended visit when doctor came to speak with pt.  A follow up is possible upon request or need.

## 2014-01-13 NOTE — Progress Notes (Signed)
FMTS Attending Note Patient's care reviewed with resident team, I agree with Dr Parks Ranger' assessment and plan.  Patient is agreeable to SNF placement when arrangements are in order.  Dalbert Mayotte, MD

## 2014-01-13 NOTE — Progress Notes (Signed)
Occupational Therapy Treatment Patient Details Name: Anita [name] MRN: [id number] DOB: August 12, 1946 Today's Date: 01/13/2014    History of present illness pt presents with FTT at home since having R Humerus fx (01/03/14).  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     OT comments  Focus of session on shoulder AAROM, finger AROM, and edema management.  Pt with poor safety awareness, attempting to lean over to retrieve her cane and take a walk by herself.  Follow Up Recommendations  SNF    Equipment Recommendations       Recommendations for Other Services      Precautions / Restrictions Precautions Precautions: Fall Precaution Comments: broken distal humerus that they are trying to treat conservatively Required Braces or Orthoses: Sling Restrictions Weight Bearing Restrictions: Yes RUE Weight Bearing: Non weight bearing       Mobility Bed Mobility                  Transfers Overall transfer level: Needs assistance Equipment used: Straight cane Transfers: Sit to/from Stand Sit to Stand: Min assist         General transfer comment: cues for safe technique    Balance                                   ADL                                                Vision                     Perception     Praxis      Cognition   Behavior During Therapy: Impulsive Overall Cognitive Status: Impaired/Different from baseline Area of Impairment: Attention;Memory;Safety/judgement;Awareness   Current Attention Level: Sustained Memory: Decreased short-term memory;Decreased recall of precautions    Safety/Judgement: Decreased awareness of safety;Decreased awareness of deficits Awareness: Emergent        Extremity/Trunk Assessment               Exercises Other Exercises Other Exercises: Educated on importance of performing AROM of fingers and/or squeeze ball hourly. Donning/doffing sling/immobilizer:  Maximal assistance Correct positioning of sling/immobilizer: Maximal assistance   Shoulder Instructions Shoulder Instructions Donning/doffing sling/immobilizer: Maximal assistance Correct positioning of sling/immobilizer: Maximal assistance     General Comments      Pertinent Vitals/ Pain       Elbow pain, did not rate, repositioned in sling, 02 sats on RA 93%, RN ok with leaving 02 off  Home Living                                          Prior Functioning/Environment              Frequency Min 3X/week     Progress Toward Goals  OT Goals(current goals can now be found in the care plan section)  Progress towards OT goals: Progressing toward goals     Plan Discharge plan remains appropriate    Co-evaluation                 End of Session Equipment Utilized During Treatment: Gait belt  Activity Tolerance Patient tolerated treatment well   Patient Left in chair;with call bell/phone within reach;with family/visitor present   Nurse Communication Mobility status (blood from L ear, ok to leave off 02)        Time: 6195-0932 OT Time Calculation (min): 27 min  Charges: OT General Charges $OT Visit: 1 Procedure OT Treatments $Therapeutic Exercise: 23-37 mins  Haze Boyden Gotham Raden 01/13/2014, 10:01 AM [phone]

## 2014-01-14 ENCOUNTER — Encounter: Payer: Self-pay | Admitting: Internal Medicine

## 2014-01-14 NOTE — Discharge Summary (Signed)
FMTS Attending Note Patient's care discussed with resident team and I agree with Dr Burt Ek assessment and plan for discharge.  Dalbert Mayotte, MD

## 2014-01-14 NOTE — Progress Notes (Signed)
Faxed ibrance pa form to [employer] °

## 2014-01-15 ENCOUNTER — Telehealth: Payer: Self-pay | Admitting: Medical Oncology

## 2014-01-15 ENCOUNTER — Other Ambulatory Visit: Payer: Medicare HMO

## 2014-01-15 ENCOUNTER — Other Ambulatory Visit: Payer: Self-pay | Admitting: Medical Oncology

## 2014-01-15 NOTE — Telephone Encounter (Signed)
Dr Percival Spanish aware - pt is now at [facility] according to notes.

## 2014-01-15 NOTE — Progress Notes (Signed)
Head and Neck Cancer Location of Tumor / Histology: Right vocal cord lesion with right level 3 lymph node   Patient says she is here due to metasises in both of her femurs.   Biopsies have not been done due to patient's respiratory and cardiovascular status.   Nutrition Status:  Weight changes: has lost 7 lbs since 11/14  Swallowing status: no  Plans, if any, for PEG tube: unknown  Tobacco/Marijuana/Snuff/ETOH use: smoked 2 packs per day for 40 years. Quit in January 2014.   Past/Anticipated interventions by otolaryngology, if any: sees Dr. 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 ER 100%/ PR 0%/Her2 neu negative   Referrals yet, to any of the following?  Social Work? No  Dentistry? no  Swallowing therapy? no  Nutrition? no  Med/Onc? yes  PEG placement? no  SAFETY ISSUES:  Prior radiation? no  Pacemaker/ICD? no  Possible current pregnancy? no  Is the patient on methotrexate? No  Current Complaints / other details: Patient has history of metastatic breast cancer to bone.  She recently fractured her right humerus, suspected secondary to bone metastasis.  Also has mets in both of her femurs.

## 2014-01-15 NOTE — Telephone Encounter (Signed)
Pt's daughter called stating that her mother has been moved to [facility] for Rehab. The nurse told her she needs the MD to call with a script for the cancer mediations she takes. I called [facility] and spoke with the nurse. She states that can not get the medications but the family can bring them in and they can dispense. She asked if we had these mediations. I explained that the pt should have femara at home but Dr. Juliann Mule had prescribed the Leslee Home that comes from [employer]. I called [facility] and they state that they have been waiting on PA and they have received it. I asked if they can call Stacie-daughter so she can receive the shipment. I gave them her cell phone number. I updated Stacie on the [place] and she is aware she will be getting a call from [employer]. She will take it to [facility] along with the femara.

## 2014-01-17 NOTE — Progress Notes (Signed)
01/11/14 1800  OT G-codes **NOT FOR INPATIENT CLASS**  Functional Assessment Tool Used Clinical observationa and chart review  Functional Limitation Self care  Self Care Current Status (Q3335) CL  Self Care Goal Status (K5625) CJ   Late entry [place], OTR/L [phone] 01/17/2014

## 2014-01-20 ENCOUNTER — Ambulatory Visit
Admission: RE | Admit: 2014-01-20 | Discharge: 2014-01-20 | Disposition: A | Discharge status: Home / Self Care | Payer: Medicare HMO | Source: Ambulatory Visit | Attending: Radiation Oncology | Admitting: Radiation Oncology

## 2014-01-20 ENCOUNTER — Encounter: Payer: Self-pay | Admitting: Radiation Oncology

## 2014-01-20 VITALS — BP 133/68 | HR 89 | Temp 98.2°F | Ht 62.0 in | Wt 231.8 lb

## 2014-01-20 DIAGNOSIS — J383 Other diseases of vocal cords: Secondary | ICD-10-CM

## 2014-01-20 DIAGNOSIS — G893 Neoplasm related pain (acute) (chronic): Secondary | ICD-10-CM | POA: Diagnosis not present

## 2014-01-20 DIAGNOSIS — C50919 Malignant neoplasm of unspecified site of unspecified female breast: Secondary | ICD-10-CM | POA: Diagnosis not present

## 2014-01-20 DIAGNOSIS — M84429A Pathological fracture, unspecified humerus, initial encounter for fracture: Secondary | ICD-10-CM | POA: Insufficient documentation

## 2014-01-20 DIAGNOSIS — R609 Edema, unspecified: Secondary | ICD-10-CM | POA: Insufficient documentation

## 2014-01-20 DIAGNOSIS — N949 Unspecified condition associated with female genital organs and menstrual cycle: Secondary | ICD-10-CM | POA: Diagnosis not present

## 2014-01-20 DIAGNOSIS — I509 Heart failure, unspecified: Secondary | ICD-10-CM | POA: Insufficient documentation

## 2014-01-20 DIAGNOSIS — C7952 Secondary malignant neoplasm of bone marrow: Secondary | ICD-10-CM

## 2014-01-20 DIAGNOSIS — Z510 Encounter for antineoplastic radiation therapy: Secondary | ICD-10-CM | POA: Diagnosis not present

## 2014-01-20 DIAGNOSIS — Z7982 Long term (current) use of aspirin: Secondary | ICD-10-CM | POA: Diagnosis not present

## 2014-01-20 DIAGNOSIS — C7951 Secondary malignant neoplasm of bone: Secondary | ICD-10-CM | POA: Insufficient documentation

## 2014-01-20 NOTE — Progress Notes (Signed)
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 She's been seen in the past to be considered for radiation treatments to the neck for presumed laryngeal carcinoma. Light of the patient's medical issues she is not candidate for biopsy of her laryngeal lesion. She denies any stridor or pain within the neck region. Recent CT scan of the neck shows minimal enlargement of the right neck adenopathy in no appreciable change in the  laryngeal lesion. Interval history is significant for the patient fracturing her right distal humerus, pathologic. The patient is not a candidate for surgery to correct this issue. She does have a cast in place placed by Dr. [name].  The patient is fairly comfortable with her right arm as far as this is concerned. She is now complaining of significant pain in her right pelvis region particularly with standing. Recent x-rays show shows heterogeneously sclerotic lesions involving the pelvis and bilateral proximal femurs consistent with metastatic breast cancer.  In light of patient's pain in the right pelvis region she is now be considered for palliative treatments to this area.                             ALLERGIES:  is allergic to omnipaque and benzene.  Meds: Current Outpatient Prescriptions  Medication Sig Dispense Refill  . albuterol (PROVENTIL HFA;VENTOLIN HFA) 108 (90 BASE) MCG/ACT inhaler Inhale 1 puff into the lungs 2 (two) times daily as needed for wheezing or shortness of breath.      [name] aspirin EC 81 MG EC tablet Take 1 tablet (81 mg total) by mouth daily.  30 tablet  3  . atorvastatin (LIPITOR) 40 MG tablet Take 1 tablet (40 mg total) by mouth daily at 6  PM.  30 tablet  0  . furosemide (LASIX) 40 MG tablet Take 1 tablet (40 mg total) by mouth daily.  30 tablet  0  . HYDROcodone-acetaminophen (NORCO/VICODIN) 5-325 MG per tablet Take 2 tablets by mouth every 4 (four) hours as needed for moderate pain.  30 tablet  0  . isosorbide mononitrate (IMDUR) 30 MG 24 hr tablet Take 1 tablet (30 mg total) by mouth daily.  90 tablet  0  . letrozole (FEMARA) 2.5 MG tablet Take 1 tablet (2.5 mg total) by mouth daily.  90 tablet  3  . mirtazapine (REMERON) 15 MG tablet Take 1 tablet (15 mg total) by mouth at bedtime.  30 tablet  0  . nitroGLYCERIN (NITROSTAT) 0.4 MG SL tablet Place 1 tablet (0.4 mg total) under the tongue every 5 (five) minutes as needed for chest pain.  30 tablet  0  . NON FORMULARY Place 3 L into the nose as needed (oxygen).       . potassium chloride SA (K-DUR,KLOR-CON) 20 MEQ tablet Take 1-2 tablets (20-40 mEq total) by mouth 2 (two) times daily. 2 tablets (40 mEq) AM and 1 tablet ( 20 mEq) PM      . Umeclidinium-Vilanterol 62.5-25 MCG/INH AEPB Inhale 1 puff into the lungs as needed (for COPD).      [name] VITAMIN D, CHOLECALCIFEROL, PO Take 1 capsule by mouth daily.      [name]  palbociclib (IBRANCE) 125 MG capsule Take 125 mg by mouth daily with breakfast. Take whole with food.       No current facility-administered medications for this encounter.    Physical Findings: The patient is in no acute distress. Patient is alert and oriented.  height is 5\' 2"  (1.575 m) and weight is 231 lb 12.8 oz (105.144 kg). Her oral temperature is 98.2 F (36.8 C). Her blood pressure is 133/68 and her pulse is 89. Her oxygen saturation is 98%. .  No palpable adenopathy in the neck region. The lungs are clear. The heart has a regular rhythm and rate. Patient has a cast in place along the right arm and a sling in place. Lower motor strength appears to be 5 out of 5 in the proximal and distal muscle groups.  Lab Findings: Lab Results  Component Value Date   WBC 6.1  01/13/2014   HGB 10.5* 01/13/2014   HCT 33.4* 01/13/2014   MCV 78.2 01/13/2014   PLT 309 01/13/2014      Radiographic Findings: Dg Chest 2 View  01/09/2014   CLINICAL DATA:  Arm injury.  EXAM: CHEST  2 VIEW  COMPARISON:  07/06/2013  FINDINGS: There is chronic cardiomegaly. Unchanged mediastinal contours. No edema, consolidation, effusion, or pneumothorax. Evidence of bony metastatic disease with lytic lesions in the right acromion, right clavicle, surgical neck of the left humerus, anterior left second rib, posterior left seventh rib, distal right humerus, and left glenoid.  IMPRESSION: 1. No active cardiopulmonary disease. 2. Multi focal osseous metastatic disease.   Electronically Signed   By: [name] M.D.   On: 01/09/2014 13:10   Dg Elbow Complete Right  12/31/2013   CLINICAL DATA:  Right posterior elbow pain  EXAM: RIGHT ELBOW - COMPLETE 3+ VIEW  COMPARISON:  None.  FINDINGS: There is a lucent lesion in the proximal radial metaphysis concerning for metastatic disease versus benign fibro-osseous lesion. There is a narrow zone of transition. There is a pathologic nondisplaced fracture through the proximal anterior cortex.  There is a oblique displaced and apex dorsally angulated distal humeral diaphysis fracture.  There is no dislocation.  IMPRESSION: 1. Lucent lesion in the proximal radial metaphysis concerning for metastatic disease versus benign fibro-osseous lesion, with a pathologic nondisplaced fracture through the proximal anterior cortex. 2. Oblique displaced and apex dorsally angulated distal humeral diaphysis fracture.   Electronically Signed   By: [name]   On: 12/31/2013 22:10   Dg Hip Complete Right  12/26/2013   CLINICAL DATA:  Ongoing right hip pain  EXAM: RIGHT HIP - COMPLETE 2+ VIEW  COMPARISON:  None.  FINDINGS: No fracture or dislocation is seen.  Bilateral hip joint spaces are symmetric.  Visualized bony pelvis appears intact.  Heterogeneous sclerosis involving the pelvis  and bilateral proximal femurs, likely corresponding to known metastases when correlating with prior CT.  Left renal staghorn calculus overlying the left mid abdomen.  IMPRESSION: No fracture or dislocation is seen.  Suspected osseous metastases.  Left renal staghorn calculus.   Electronically Signed   By: Julian Hy M.D.   On: 12/26/2013 20:55   Ct Soft Tissue Neck Wo Contrast  12/30/2013   CLINICAL DATA:  Vocal cord lesion. Hoarseness. Metastatic breast cancer. IV contrast was not administered due to contrast allergy and patient not desiring to do a steroid premedication.  EXAM: CT NECK WITHOUT CONTRAST  TECHNIQUE: Multidetector CT imaging of the neck was performed following the standard protocol without intravenous contrast.  COMPARISON:  06/24/2013 neck CT.  05/13/2013 chest CT.  FINDINGS: The visualized portion of the brain is grossly unremarkable. Orbits are unremarkable. Paranasal sinuses and mastoid air cells are clear.  Evaluation of the soft tissues of the neck is limited by lack of IV contrast. The nasopharynx, oropharynx, and oral cavity are grossly unremarkable. Slight nodularity along the anterior aspect of the right true vocal cord on the prior study is less conspicuous on the current examination. Asymmetry of the overlying right thyroid cartilage is unchanged. Asymmetry is again noted of the laryngeal ventricles, with the right being larger than the left. Diffusely heterogeneous thyroid gland is again noted. Submandibular and parotid glands are unremarkable.  Right level IV lymph node appears slightly larger than on the prior study, measuring 11 x 8 mm (series 2, image 80 to, previously 9 x 6 mm upon remeasurement on axial images). No new enlarged lymph nodes are identified.  5 mm right upper lobe ground-glass nodule is unchanged (series 4, image 51). 5 mm left upper lobe ground-glass nodule appears slightly larger than prior chest CT (series 4, image 50). Vertebral bodies are diffusely  heterogeneous with areas of sclerosis as well as patchy lucency the lucent lesions have increased from the prior CT. For example, there is a 6 mm lesion in the base the dens which is new or increased. Lesion anteriorly in the C3 vertebral body has increased in size, and there also multiple new or larger lucent lesions in the upper thoracic spine. Multilevel spondylosis is present in the cervical spine.  IMPRESSION: 1. Minimally increased size of a right level IV lymph node. 2. Slightly decreased conspicuity of right vocal cord nodularity described on the prior study with unchanged asymmetry of the overlying thyroid cartilage. 3. Slightly increased size of left upper lobe lung nodule. 4. Multiple lytic lesions in the visualized spine, new/increased from the prior study and concerning for progressive osseous metastatic disease.   Electronically Signed   By: Logan Bores   On: 12/30/2013 17:25   Dg Knee Complete 4 Views Right  12/26/2013   CLINICAL DATA:  Right knee pain, no injury  EXAM: RIGHT KNEE - COMPLETE 4+ VIEW  COMPARISON:  None.  FINDINGS: No fracture or dislocation is seen.  Vague intramedullary lucency in the distal humerus is possible on the oblique view, possibly prior median of with wide zone of transition, but without associated osseous destruction.  The joint spaces are preserved.  Suprapatellar and infrapatellar enthesopathy.  The visualized soft tissues are unremarkable.  No suprapatellar knee joint effusion.  IMPRESSION: Possible permeative lytic lesion involving the distal humerus, worrisome for metastasis, less likely myeloma.  Follow-up MRI knee with/without contrast is suggested for further evaluation.   Electronically Signed   By: Julian Hy M.D.   On: 12/26/2013 20:44   Dg Humerus Right  12/31/2013   CLINICAL DATA:  Right elbow and distal humerus pain. Heard pop when reaching for door.  EXAM: RIGHT HUMERUS - 2+ VIEW  COMPARISON:  Right elbow radiographs 12/31/2013  FINDINGS: There is  an oblique complete fracture through the distal third of the right humerus diaphysis. At the level of the fracture, there is probable lucent lesion measuring approximately 2.2 cm diameter in the intact portion of the proximal side of the fracture.  There is also discrete lucent lesion in the distal right clavicle. Possible lucent lesion in the right acromion.  IMPRESSION: Acute fracture of the distal right humerus diaphysis. This is highly suspicious for a pathologic fracture.  Patient has known bony metastatic disease secondary to breast cancer, per the electronic medical record.  Lucent lesions in the distal clavicle and acromion are suspicious for metastatic disease.   Electronically Signed   By: Curlene Dolphin M.D.   On: 12/31/2013 22:33    Impression:  Metastatic breast cancer and probable laryngeal carcinoma (unbiopsied) patient is symptomatic from her osseous metastasis in the right pelvis region. She would be a good candidate for palliative treatments to this area. I discussed consideration for radiation treatments to the laryngeal area and the expected course of treatment side effects. The patient is not willing to proceed with radiation therapy to this area but becoming more receptive to this treatment.  She is agreeable to treatments directed at the right femur/pelvis region  Plan:  Simulation and planning in the near future with treatments directed at the right femur/pelvis region..  ____________________________________ Blair Promise, MD

## 2014-01-20 NOTE — Progress Notes (Signed)
Please see the Nurse Progress Note in the MD Initial Consult Encounter for this patient. 

## 2014-01-27 ENCOUNTER — Ambulatory Visit
Admission: RE | Admit: 2014-01-27 | Discharge: 2014-01-27 | Disposition: A | Discharge status: Home / Self Care | Payer: Medicare HMO | Source: Ambulatory Visit | Attending: Radiation Oncology | Admitting: Radiation Oncology

## 2014-01-27 DIAGNOSIS — Z510 Encounter for antineoplastic radiation therapy: Secondary | ICD-10-CM | POA: Diagnosis not present

## 2014-01-27 DIAGNOSIS — C50919 Malignant neoplasm of unspecified site of unspecified female breast: Secondary | ICD-10-CM

## 2014-01-27 DIAGNOSIS — C7951 Secondary malignant neoplasm of bone: Principal | ICD-10-CM

## 2014-01-28 DIAGNOSIS — Z510 Encounter for antineoplastic radiation therapy: Secondary | ICD-10-CM | POA: Diagnosis not present

## 2014-01-28 NOTE — Progress Notes (Signed)
  Radiation Oncology         [phone]) [phone] ________________________________  Name: Anita [name] MRN: [id number]  Date: 01/27/2014  DOB: 1946/08/05  SIMULATION AND TREATMENT PLANNING NOTE  DIAGNOSIS:  Metastatic breast cancer  NARRATIVE:  The patient was brought to the [facility].  Identity was confirmed.  All relevant records and images related to the planned course of therapy were reviewed.  The patient freely provided informed written consent to proceed with treatment after reviewing the details related to the planned course of therapy. The consent form was witnessed and verified by the simulation staff.  Then, the patient was set-up in a stable reproducible  supine position for radiation therapy.  CT images were obtained.  Surface markings were placed.  The CT images were loaded into the planning software.  Then the target and avoidance structures were contoured.  Treatment planning then occurred.  The radiation prescription was entered and confirmed.  Then, I designed and supervised the construction of a total of 5 medically necessary complex treatment devices.  I have requested : Isodose Plan.  I have ordered:dose calc.  PLAN:  The patient will receive 30 Gy in 10 fractions directed at both the left and right femur.  ________________________________  -----------------------------------  Blair Promise, PhD, MD

## 2014-01-29 ENCOUNTER — Ambulatory Visit: Payer: Medicare HMO

## 2014-01-29 ENCOUNTER — Encounter: Payer: Self-pay | Admitting: Cardiology

## 2014-01-29 DIAGNOSIS — Z510 Encounter for antineoplastic radiation therapy: Secondary | ICD-10-CM | POA: Diagnosis not present

## 2014-02-02 ENCOUNTER — Ambulatory Visit
Admission: RE | Admit: 2014-02-02 | Discharge: 2014-02-02 | Disposition: A | Discharge status: Home / Self Care | Payer: Medicare HMO | Source: Ambulatory Visit | Attending: Radiation Oncology | Admitting: Radiation Oncology

## 2014-02-02 ENCOUNTER — Encounter: Payer: Self-pay | Admitting: *Deleted

## 2014-02-02 ENCOUNTER — Ambulatory Visit: Payer: Commercial Managed Care - HMO

## 2014-02-02 ENCOUNTER — Encounter: Payer: Self-pay | Admitting: Internal Medicine

## 2014-02-02 ENCOUNTER — Other Ambulatory Visit (HOSPITAL_BASED_OUTPATIENT_CLINIC_OR_DEPARTMENT_OTHER): Payer: Commercial Managed Care - HMO

## 2014-02-02 ENCOUNTER — Telehealth: Payer: Self-pay | Admitting: Internal Medicine

## 2014-02-02 ENCOUNTER — Ambulatory Visit: Admission: RE | Admit: 2014-02-02 | Payer: Medicare HMO | Source: Ambulatory Visit | Admitting: Radiation Oncology

## 2014-02-02 ENCOUNTER — Other Ambulatory Visit: Payer: Self-pay | Admitting: Medical Oncology

## 2014-02-02 ENCOUNTER — Ambulatory Visit (HOSPITAL_BASED_OUTPATIENT_CLINIC_OR_DEPARTMENT_OTHER): Payer: Commercial Managed Care - HMO | Admitting: Internal Medicine

## 2014-02-02 VITALS — BP 137/63 | HR 87 | Temp 98.0°F | Resp 18 | Ht 62.0 in | Wt 235.2 lb

## 2014-02-02 DIAGNOSIS — M899 Disorder of bone, unspecified: Secondary | ICD-10-CM

## 2014-02-02 DIAGNOSIS — J449 Chronic obstructive pulmonary disease, unspecified: Secondary | ICD-10-CM

## 2014-02-02 DIAGNOSIS — Z510 Encounter for antineoplastic radiation therapy: Secondary | ICD-10-CM | POA: Diagnosis not present

## 2014-02-02 DIAGNOSIS — C7952 Secondary malignant neoplasm of bone marrow: Secondary | ICD-10-CM

## 2014-02-02 DIAGNOSIS — N189 Chronic kidney disease, unspecified: Secondary | ICD-10-CM

## 2014-02-02 DIAGNOSIS — I519 Heart disease, unspecified: Secondary | ICD-10-CM

## 2014-02-02 DIAGNOSIS — D638 Anemia in other chronic diseases classified elsewhere: Secondary | ICD-10-CM

## 2014-02-02 DIAGNOSIS — J4489 Other specified chronic obstructive pulmonary disease: Secondary | ICD-10-CM

## 2014-02-02 DIAGNOSIS — C50919 Malignant neoplasm of unspecified site of unspecified female breast: Secondary | ICD-10-CM

## 2014-02-02 DIAGNOSIS — C7951 Secondary malignant neoplasm of bone: Secondary | ICD-10-CM

## 2014-02-02 DIAGNOSIS — D649 Anemia, unspecified: Secondary | ICD-10-CM

## 2014-02-02 DIAGNOSIS — E119 Type 2 diabetes mellitus without complications: Secondary | ICD-10-CM

## 2014-02-02 DIAGNOSIS — N6310 Unspecified lump in the right breast, unspecified quadrant: Secondary | ICD-10-CM

## 2014-02-02 DIAGNOSIS — C50419 Malignant neoplasm of upper-outer quadrant of unspecified female breast: Secondary | ICD-10-CM

## 2014-02-02 DIAGNOSIS — C7800 Secondary malignant neoplasm of unspecified lung: Secondary | ICD-10-CM

## 2014-02-02 DIAGNOSIS — M949 Disorder of cartilage, unspecified: Secondary | ICD-10-CM

## 2014-02-02 DIAGNOSIS — D72819 Decreased white blood cell count, unspecified: Secondary | ICD-10-CM

## 2014-02-02 LAB — COMPREHENSIVE METABOLIC PANEL (CC13)
ALBUMIN: 3.3 g/dL — AB (ref 3.5–5.0)
ALT: 19 U/L (ref 0–55)
ANION GAP: 10 meq/L (ref 3–11)
AST: 18 U/L (ref 5–34)
Alkaline Phosphatase: 158 U/L — ABNORMAL HIGH (ref 40–150)
BUN: 24.2 mg/dL (ref 7.0–26.0)
CALCIUM: 9 mg/dL (ref 8.4–10.4)
CHLORIDE: 106 meq/L (ref 98–109)
CO2: 26 meq/L (ref 22–29)
Creatinine: 1.5 mg/dL — ABNORMAL HIGH (ref 0.6–1.1)
GLUCOSE: 118 mg/dL (ref 70–140)
POTASSIUM: 4.1 meq/L (ref 3.5–5.1)
Sodium: 142 mEq/L (ref 136–145)
TOTAL PROTEIN: 6.5 g/dL (ref 6.4–8.3)
Total Bilirubin: 0.38 mg/dL (ref 0.20–1.20)

## 2014-02-02 LAB — CBC WITH DIFFERENTIAL/PLATELET
BASO%: 0.5 % (ref 0.0–2.0)
BASOS ABS: 0 10*3/uL (ref 0.0–0.1)
EOS ABS: 0 10*3/uL (ref 0.0–0.5)
EOS%: 1.2 % (ref 0.0–7.0)
HCT: 30.3 % — ABNORMAL LOW (ref 34.8–46.6)
HEMOGLOBIN: 9.6 g/dL — AB (ref 11.6–15.9)
LYMPH%: 18.9 % (ref 14.0–49.7)
MCH: 24.8 pg — ABNORMAL LOW (ref 25.1–34.0)
MCHC: 31.7 g/dL (ref 31.5–36.0)
MCV: 78.1 fL — ABNORMAL LOW (ref 79.5–101.0)
MONO#: 0.1 10*3/uL (ref 0.1–0.9)
MONO%: 5.2 % (ref 0.0–14.0)
NEUT%: 74.2 % (ref 38.4–76.8)
NEUTROS ABS: 2.1 10*3/uL (ref 1.5–6.5)
PLATELETS: 253 10*3/uL (ref 145–400)
RBC: 3.87 10*6/uL (ref 3.70–5.45)
RDW: 18.7 % — AB (ref 11.2–14.5)
WBC: 2.8 10*3/uL — AB (ref 3.9–10.3)
lymph#: 0.5 10*3/uL — ABNORMAL LOW (ref 0.9–3.3)

## 2014-02-02 NOTE — Telephone Encounter (Signed)
Gave pt appt for labs and MD for June and July 2015

## 2014-02-02 NOTE — Progress Notes (Signed)
  Radiation Oncology         [phone] ________________________________  Name: Anita [name] MRN: [id number]  Date: 02/02/2014  DOB: 1945-11-27  Simulation Verification Note  Status: outpatient  NARRATIVE: The patient was brought to the treatment unit and placed in the planned treatment position. The clinical setup was verified. Then port films were obtained and uploaded to the radiation oncology medical record software.  The treatment beams were carefully compared against the planned radiation fields. The position location and shape of the radiation fields was reviewed. They targeted volume of tissue appears to be appropriately covered by the radiation beams. Organs at risk appear to be excluded as planned.  Based on my personal review, I approved the simulation verification. The patient's treatment will proceed as planned.  -----------------------------------  Blair Promise, PhD, MD

## 2014-02-02 NOTE — Progress Notes (Signed)
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 Biopsy only. Added Palbociclib 125 mg daily for 21 days on and 7 days off on 01/01/2014. She is on Cycle #1, day 13 of letrozole plus palbociclib.  Adjunctive zometa added on 01/01/2014.  Started palliative XRT to the left and right femur (30 Gy in 10 fractions) on 02/02/2014 per Dr. Gery Pray.   INTERVAL HISTORY: Anita [name] 68 y.o. female with a history of right breast cancer with metastases to the bone/lungs here for 1 month followup.  She was last seen by me on 01/01/2014.    Today, she is accompanied by Anita family including Anita husband Nadara Mustard, Anita [name] and [name] and Anita son Tommi Rumps.   She continues to recover from Anita recent right arm pathologic fracture.  She was seen earlier by Dr. Sondra Come for XRT.  She notes vaginal discharge.  She continues to have a hoarse voice.  She notes some improvement in Anita generalized bone pain.   She denies recent hospitalizations.  She also expressed some discomfort post-prandially that is relieved with anti-acids.   MEDICAL HISTORY: Past Medical History  Diagnosis Date  . Fibroid   . Morbid obesity   . 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 COPD (chronic obstructive pulmonary disease)   . Chronic diastolic CHF (congestive heart failure)   . NSTEMI (non-ST elevated myocardial infarction)     Cath November 2014 LAD 40% stenosis, first diagonal 80% stenosis, circumflex 20% stenosis, right coronary artery occluded. The EF was 35-40% at that time.  . Anemia     a. Adm 09/2012 with melena, Hgb 5.8 -> transfused. EGD/colonoscopy unrevealing.  . Breast cancer     a. Mets to bone. ER 100%/ PR 0%/Her2 neu negative.  [name]  Ulcers of both lower legs   . Tobacco abuse   . Hyperlipidemia 02/11/2013  . Diabetes mellitus without complication   . Chronic respiratory failure     a. On O2 qhs. also portable O2.  . Chronic renal insufficiency   . Lesion of vocal cord     a. CT 05/2013 concerning for tumor.  . Shortness of breath    INTERIM HISTORY: has DM2 (diabetes mellitus, type 2); COPD (chronic obstructive pulmonary disease); Chronic respiratory failure; Chronic combined systolic and diastolic congestive heart failure; CAD (coronary artery disease); Chronic kidney disease; Anemia of chronic disease; Breast mass, right; History of tobacco abuse; Breast cancer metastasized to bone; Hyperlipidemia; Insomnia; Lesion of vocal cord; Intertrigo; Vaginal yeast infection; Malodorous urine; Closed right arm fracture; and Weakness on Anita problem list.    ALLERGIES:  is allergic to omnipaque and benzene.  MEDICATIONS: has a current medication list which includes the following prescription(s): albuterol, aspirin, atorvastatin, furosemide, hydrocodone-acetaminophen, isosorbide mononitrate, letrozole, mirtazapine, nitroglycerin, NON FORMULARY, palbociclib, potassium chloride sa, umeclidinium-vilanterol, and cholecalciferol.  SURGICAL HISTORY:  Past Surgical History  Procedure Laterality Date  . Colonoscopy, esophagogastroduodenoscopy (egd) and esophageal dilation N/A 10/13/2012    Procedure: colonoscopy and egd;  Surgeon: Wonda Horner, MD;  Location: [facility];  Service: Endoscopy;  Laterality: N/A;   REVIEW OF SYSTEMS:   Constitutional: Denies fevers, chills or abnormal weight loss Eyes: Denies blurriness of vision Ears, nose, mouth, throat, and  face: Denies mucositis or sore throat Respiratory: Denies cough, dyspnea or wheezes Cardiovascular: Denies palpitation, chest discomfort or lower extremity swelling Gastrointestinal:  Denies nausea, heartburn or change in bowel habits Skin: Denies abnormal skin rashes Lymphatics:  Denies new lymphadenopathy or easy bruising Neurological:Denies numbness, tingling or new weaknesses Behavioral/Psych: Mood is stable, no new changes  All other systems were reviewed with the patient and are negative.  PHYSICAL EXAMINATION: ECOG PERFORMANCE STATUS: 1 - Symptomatic but completely ambulatory  Blood pressure 137/63, pulse 87, temperature 98 F (36.7 C), temperature source Oral, resp. rate 18, height 5\' 2"  (1.575 m), weight 235 lb 3.2 oz (106.686 kg), SpO2 99.00%.  GENERAL:alert, no distress and comfortable morbidly obese woman SKIN: skin color, texture, turgor are normal, no rashes or significant lesions EYES: normal, Conjunctiva are pink and non-injected, sclera clear OROPHARYNX:no exudate, no erythema and lips, buccal mucosa, and tongue normal  NECK: supple, thyroid normal size, non-tender, without nodularity LYMPH:  no palpable lymphadenopathy in the cervical, axillary LUNGS: coarse breathing with slightly increased breathing rate HEART: regular rate & rhythm and no murmurs and 2+ lower extremity edema BREAST: deferred ABDOMEN:abdomen soft, non-tender and normal bowel sounds; obese Musculoskeletal:no cyanosis of digits and no clubbing; R arm in a brace NEURO: alert & oriented x 3 with fluent speech, no focal motor/sensory deficits  LABORATORY DATA: CBC    Component Value Date/Time   WBC 2.8* 02/02/2014 1438   WBC 6.1 01/13/2014 0625   RBC 3.87 02/02/2014 1438   RBC 4.27 01/13/2014 0625   RBC 3.18* 09/26/2012 0518   HGB 9.6* 02/02/2014 1438   HGB 10.5* 01/13/2014 0625   HCT 30.3* 02/02/2014 1438   HCT 33.4* 01/13/2014 0625   PLT 253 02/02/2014 1438   PLT 309 01/13/2014 0625   MCV 78.1* 02/02/2014 1438   MCV 78.2 01/13/2014 0625   MCH 24.8* 02/02/2014 1438   MCH 24.6* 01/13/2014 0625   MCHC 31.7 02/02/2014 1438   MCHC 31.4 01/13/2014 0625   RDW 18.7* 02/02/2014 1438   RDW 18.2* 01/13/2014 0625   LYMPHSABS 0.5* 02/02/2014 1438   LYMPHSABS 0.7 01/10/2014 1355   MONOABS 0.1 02/02/2014  1438   MONOABS 0.6 01/10/2014 1355   EOSABS 0.0 02/02/2014 1438   EOSABS 0.1 01/10/2014 1355   BASOSABS 0.0 02/02/2014 1438   BASOSABS 0.0 01/10/2014 1355   CMP     Component Value Date/Time   NA 142 02/02/2014 1438   NA 141 01/13/2014 0625   K 4.1 02/02/2014 1438   K 4.2 01/13/2014 0625   CL 105 01/13/2014 0625   CL 101 02/11/2013 0950   CO2 26 02/02/2014 1438   CO2 24 01/13/2014 0625   GLUCOSE 118 02/02/2014 1438   GLUCOSE 126* 01/13/2014 0625   GLUCOSE 143* 02/11/2013 0950   BUN 24.2 02/02/2014 1438   BUN 34* 01/13/2014 0625   CREATININE 1.5* 02/02/2014 1438   CREATININE 1.31* 01/13/2014 0625   CREATININE 1.25* 03/30/2013 1558   CALCIUM 9.0 02/02/2014 1438   CALCIUM 8.7 01/13/2014 0625   CALCIUM 6.6* 09/29/2012 1118   PROT 6.5 02/02/2014 1438   PROT 7.0 01/10/2014 1355   ALBUMIN 3.3* 02/02/2014 1438   ALBUMIN 3.5 01/10/2014 1355   AST 18 02/02/2014 1438   AST 18 01/10/2014 1355   ALT 19 02/02/2014 1438   ALT 24 01/10/2014 1355   ALKPHOS 158* 02/02/2014 1438   ALKPHOS 147* 01/10/2014 1355   BILITOT 0.38 02/02/2014 1438   BILITOT 0.4 01/10/2014 1355   GFRNONAA 41*  01/13/2014 0625   GFRAA 48* 01/13/2014 0625    RADIOGRAPHIC STUDIES: 12/30/2013 CT NECK WITHOUT CONTRAST TECHNIQUE: Multidetector CT imaging of the neck was performed following the standard protocol without intravenous contrast. COMPARISON: 06/24/2013 neck CT. 05/13/2013 chest CT. FINDINGS:  The visualized portion of the brain is grossly unremarkable. Orbits are unremarkable. Paranasal sinuses and mastoid air cells are clear. Evaluation of the soft tissues of the neck is limited by lack of IV contrast. The nasopharynx, oropharynx, and oral cavity are grossly unremarkable. Slight nodularity along the anterior aspect of the right true vocal cord on the prior study is less conspicuous on the current examination. Asymmetry of the overlying right thyroid cartilage is unchanged. Asymmetry is again noted of the laryngeal ventricles, with the right being larger than the  left. Diffusely heterogeneous thyroid gland is again noted. Submandibular and parotid glands are unremarkable.  Right level IV lymph node appears slightly larger than on the prior  study, measuring 11 x 8 mm (series 2, image 80 to, previously 9 x 6 mm upon remeasurement on axial images). No new enlarged lymph nodes are identified. 5 mm right upper lobe ground-glass nodule is unchanged (series 4, image 51). 5 mm left upper lobe ground-glass nodule appears slightly larger than prior chest CT (series 4, image 50). Vertebral bodies are diffusely heterogeneous with areas of sclerosis as well as patchy lucency the lucent lesions have increased from the prior CT. For example, there is a 6 mm lesion in the base the dens which is  new or increased. Lesion anteriorly in the C3 vertebral body has  increased in size, and there also multiple new or larger lucent  lesions in the upper thoracic spine. Multilevel spondylosis is  present in the cervical spine. IMPRESSION: 1. Minimally increased size of a right level IV lymph node. 2. Slightly decreased conspicuity of right vocal cord nodularity  described on the prior study with unchanged asymmetry of the  overlying thyroid cartilage. 3. Slightly increased size of left upper lobe lung nodule. 4. Multiple lytic lesions in the visualized spine, new/increased from the prior study and concerning for progressive osseous metastatic disease.  12/31/2013 RIGHT HUMERUS - 2+ VIEW  COMPARISON: Right elbow radiographs 12/31/2013 FINDINGS:  There is an oblique complete fracture through the distal third of  the right humerus diaphysis. At the level of the fracture, there is  probable lucent lesion measuring approximately 2.2 cm diameter in the intact portion of the proximal side of the fracture. There is also discrete lucent lesion in the distal right clavicle. Possible lucent lesion in the right acromion. IMPRESSION: Acute fracture of the distal right humerus diaphysis. This is  highly  suspicious for a pathologic fracture. Patient has known bony  metastatic disease secondary to breast cancer, per the electronic medical record. Lucent lesions in the distal clavicle and acromion are suspicious for metastatic disease.   Results for BERNEDA, PICCININNI (MRN [id number]) as of 02/03/2014 09:12  Ref. Range 05/13/2013 14:14 08/26/2013 12:45 02/02/2014 14:38  CA 27.29 Latest Range: 0-39 U/mL 19 25 45 (H)   ASSESSMENT: Metastatic breast cancer on palliative letrozole, with progressive disease.   PLAN:  1. Acute right distal oblique fracture (12/31/2013). --Likely secondary to number #3. We  started zometa q 4 weeks (first dose on 01/01/2014) to reduce further skeletal events.  Patient had declined previously.  She consented understanding indications, benefits and risks including but not limited to nephrotoxicity, hypocalcemia, osteoradionecrosis of the jaw.  Anita arm is in a brace.  2. Bone Pain secondary #3.. --Started palliative XRT to left/right femur on 02/02/2014 per Dr. Sondra Come.  She will receive 30 Gy in 10 fractions.   3.  Metastatic breast cancer (ER pos/PR pos/ HER2 neg) with spread to bone, lungs.  -Clinically, she has had a response with stable breast mass. However, Anita recent CT of Neck and arm x-rays show progressive bone lesions.  Further, Anita  CT of Chest showed a new lung nodule.  She is tolerating letrozole well without bone/muscle pain, hot flash/night sweat.  We will add palbociclib based on (Finn, 2015)  She will take 125 mg daily for 21 days then 7 days off with Anita letrozole daily.  We discussed extensively the indications, benefits and risks including but not limited to abnormal absolute lymphocyte count, neutropenia, leukopenia, thrombocytopenia, weakness, alopecia and fatigue and nausea.  She understood these risks and agree to proceed.  She is on day #13 with a drop in Anita hematological counts.  We will repeat Anita labs in 2 weeks and if counts are low, we will  consider 2 weeks on followed by one week off of palbociclib.  Anita CA 27.29 demonstrates elevation to 45 up from 25 six months prior.    --We will plan on re-staging Anita following 3 cycles of therapy.   4. Stage III (T1N1M0) Laryngeal Carcinoma, probable.   --She has been seen by radiation oncology.  In the presence of Nurse Gerald Dexter, we reviewed Anita CT of neck as noted above and she agreed to further evaluation by Dr. Sondra Come.  She declined intervention presently.  She is being followed by Dr. Sondra Come.   5. Leukopenia/Anemia of chronic disease plus/minus iron defiency. -- Past work up for reversible causes of anemia were negative.  MCV of 78.1 concerning for mild iron defiency. Patient cannot tolerate iron.  We will check iron studies with Anita repeat labs in 2 weeks and if ferritin is less than 30, set up for intravenous feraheme.   --Anita WBC are decreasing.  This can in part be explained by Anita palbociclib.  We will repeat labs, as noted above.   6. Grade 2 dystolic dysfunction. --She is fluid overloaded today on exam. She is on Lasix, nitrogylerin prn, losartan. Increase lasix prn and weight daily.   7. Diabetes mellitus, type II. Diet controlled.   8. COPD: On albuterol prn. She remains on home oxygen.  9. Chronic renal insufficiency. Creatinine clearance of 42.3 mL/min today.    10. Follow up: In about 1 month with CBC, CMP, CA 27 29. CBC every 2 weeks for the first couple months on palbociclib.      All questions were answered. The patient knows to call the clinic with any problems, questions or concerns. We can certainly see the patient much sooner if necessary.  I spent 25 minutes counseling the patient face to face. The total time spent in the appointment was 40 minutes.    Bayley Yarborough, MD 02/03/2014 11:06 AM

## 2014-02-03 ENCOUNTER — Ambulatory Visit
Admission: RE | Admit: 2014-02-03 | Discharge: 2014-02-03 | Disposition: A | Discharge status: Home / Self Care | Payer: Medicare HMO | Source: Ambulatory Visit | Attending: Radiation Oncology | Admitting: Radiation Oncology

## 2014-02-03 ENCOUNTER — Inpatient Hospital Stay (HOSPITAL_COMMUNITY)
Admission: EM | Admit: 2014-02-03 | Discharge: 2014-02-07 | DRG: 292 | Disposition: A | Discharge status: Home Health | Payer: Medicare HMO | Attending: Family Medicine | Admitting: Family Medicine

## 2014-02-03 ENCOUNTER — Emergency Department (HOSPITAL_COMMUNITY): Payer: Medicare HMO

## 2014-02-03 ENCOUNTER — Encounter: Payer: Self-pay | Admitting: Radiation Oncology

## 2014-02-03 ENCOUNTER — Encounter (HOSPITAL_COMMUNITY): Payer: Self-pay | Admitting: Emergency Medicine

## 2014-02-03 ENCOUNTER — Other Ambulatory Visit: Payer: Self-pay

## 2014-02-03 VITALS — BP 103/60 | HR 84 | Resp 22 | Wt 234.3 lb

## 2014-02-03 DIAGNOSIS — D638 Anemia in other chronic diseases classified elsewhere: Secondary | ICD-10-CM

## 2014-02-03 DIAGNOSIS — J449 Chronic obstructive pulmonary disease, unspecified: Secondary | ICD-10-CM | POA: Diagnosis present

## 2014-02-03 DIAGNOSIS — Z87898 Personal history of other specified conditions: Secondary | ICD-10-CM

## 2014-02-03 DIAGNOSIS — N179 Acute kidney failure, unspecified: Secondary | ICD-10-CM | POA: Diagnosis present

## 2014-02-03 DIAGNOSIS — R5381 Other malaise: Secondary | ICD-10-CM | POA: Diagnosis present

## 2014-02-03 DIAGNOSIS — T451X5A Adverse effect of antineoplastic and immunosuppressive drugs, initial encounter: Secondary | ICD-10-CM | POA: Diagnosis present

## 2014-02-03 DIAGNOSIS — R0989 Other specified symptoms and signs involving the circulatory and respiratory systems: Secondary | ICD-10-CM | POA: Diagnosis present

## 2014-02-03 DIAGNOSIS — I2510 Atherosclerotic heart disease of native coronary artery without angina pectoris: Secondary | ICD-10-CM

## 2014-02-03 DIAGNOSIS — M84429A Pathological fracture, unspecified humerus, initial encounter for fracture: Secondary | ICD-10-CM | POA: Diagnosis present

## 2014-02-03 DIAGNOSIS — I5042 Chronic combined systolic (congestive) and diastolic (congestive) heart failure: Secondary | ICD-10-CM

## 2014-02-03 DIAGNOSIS — Z87891 Personal history of nicotine dependence: Secondary | ICD-10-CM | POA: Diagnosis not present

## 2014-02-03 DIAGNOSIS — Z7982 Long term (current) use of aspirin: Secondary | ICD-10-CM | POA: Diagnosis not present

## 2014-02-03 DIAGNOSIS — D631 Anemia in chronic kidney disease: Secondary | ICD-10-CM | POA: Diagnosis present

## 2014-02-03 DIAGNOSIS — R609 Edema, unspecified: Secondary | ICD-10-CM | POA: Diagnosis not present

## 2014-02-03 DIAGNOSIS — E876 Hypokalemia: Secondary | ICD-10-CM | POA: Diagnosis present

## 2014-02-03 DIAGNOSIS — E785 Hyperlipidemia, unspecified: Secondary | ICD-10-CM | POA: Diagnosis present

## 2014-02-03 DIAGNOSIS — Z510 Encounter for antineoplastic radiation therapy: Secondary | ICD-10-CM | POA: Diagnosis present

## 2014-02-03 DIAGNOSIS — C7951 Secondary malignant neoplasm of bone: Secondary | ICD-10-CM | POA: Diagnosis present

## 2014-02-03 DIAGNOSIS — I252 Old myocardial infarction: Secondary | ICD-10-CM

## 2014-02-03 DIAGNOSIS — I5033 Acute on chronic diastolic (congestive) heart failure: Secondary | ICD-10-CM | POA: Diagnosis present

## 2014-02-03 DIAGNOSIS — D509 Iron deficiency anemia, unspecified: Secondary | ICD-10-CM | POA: Diagnosis present

## 2014-02-03 DIAGNOSIS — D72819 Decreased white blood cell count, unspecified: Secondary | ICD-10-CM | POA: Diagnosis present

## 2014-02-03 DIAGNOSIS — Z79899 Other long term (current) drug therapy: Secondary | ICD-10-CM | POA: Diagnosis not present

## 2014-02-03 DIAGNOSIS — C50919 Malignant neoplasm of unspecified site of unspecified female breast: Secondary | ICD-10-CM

## 2014-02-03 DIAGNOSIS — N183 Chronic kidney disease, stage 3 unspecified: Secondary | ICD-10-CM | POA: Diagnosis present

## 2014-02-03 DIAGNOSIS — C7952 Secondary malignant neoplasm of bone marrow: Secondary | ICD-10-CM

## 2014-02-03 DIAGNOSIS — S42301A Unspecified fracture of shaft of humerus, right arm, initial encounter for closed fracture: Secondary | ICD-10-CM

## 2014-02-03 DIAGNOSIS — G8929 Other chronic pain: Secondary | ICD-10-CM | POA: Diagnosis present

## 2014-02-03 DIAGNOSIS — N949 Unspecified condition associated with female genital organs and menstrual cycle: Secondary | ICD-10-CM | POA: Diagnosis not present

## 2014-02-03 DIAGNOSIS — E119 Type 2 diabetes mellitus without complications: Secondary | ICD-10-CM

## 2014-02-03 DIAGNOSIS — J9610 Chronic respiratory failure, unspecified whether with hypoxia or hypercapnia: Secondary | ICD-10-CM | POA: Diagnosis present

## 2014-02-03 DIAGNOSIS — Z6841 Body Mass Index (BMI) 40.0 and over, adult: Secondary | ICD-10-CM | POA: Diagnosis not present

## 2014-02-03 DIAGNOSIS — I509 Heart failure, unspecified: Secondary | ICD-10-CM | POA: Diagnosis not present

## 2014-02-03 DIAGNOSIS — N390 Urinary tract infection, site not specified: Secondary | ICD-10-CM

## 2014-02-03 DIAGNOSIS — J4489 Other specified chronic obstructive pulmonary disease: Secondary | ICD-10-CM | POA: Diagnosis present

## 2014-02-03 DIAGNOSIS — R0609 Other forms of dyspnea: Secondary | ICD-10-CM | POA: Diagnosis present

## 2014-02-03 DIAGNOSIS — N189 Chronic kidney disease, unspecified: Secondary | ICD-10-CM

## 2014-02-03 DIAGNOSIS — G893 Neoplasm related pain (acute) (chronic): Secondary | ICD-10-CM | POA: Diagnosis not present

## 2014-02-03 DIAGNOSIS — N039 Chronic nephritic syndrome with unspecified morphologic changes: Secondary | ICD-10-CM

## 2014-02-03 HISTORY — DX: Unspecified dementia, unspecified severity, without behavioral disturbance, psychotic disturbance, mood disturbance, and anxiety: F03.90

## 2014-02-03 HISTORY — DX: Depression, unspecified: F32.A

## 2014-02-03 HISTORY — DX: Major depressive disorder, single episode, unspecified: F32.9

## 2014-02-03 LAB — CBC
HCT: 29.8 % — ABNORMAL LOW (ref 36.0–46.0)
Hemoglobin: 9.7 g/dL — ABNORMAL LOW (ref 12.0–15.0)
MCH: 25.4 pg — AB (ref 26.0–34.0)
MCHC: 32.6 g/dL (ref 30.0–36.0)
MCV: 78 fL (ref 78.0–100.0)
Platelets: 205 10*3/uL (ref 150–400)
RBC: 3.82 MIL/uL — ABNORMAL LOW (ref 3.87–5.11)
RDW: 18.4 % — AB (ref 11.5–15.5)
WBC: 2.5 10*3/uL — ABNORMAL LOW (ref 4.0–10.5)

## 2014-02-03 LAB — URINE MICROSCOPIC-ADD ON

## 2014-02-03 LAB — BASIC METABOLIC PANEL
BUN: 26 mg/dL — ABNORMAL HIGH (ref 6–23)
CALCIUM: 9.5 mg/dL (ref 8.4–10.5)
CO2: 26 mEq/L (ref 19–32)
CREATININE: 1.49 mg/dL — AB (ref 0.50–1.10)
Chloride: 103 mEq/L (ref 96–112)
GFR, EST AFRICAN AMERICAN: 41 mL/min — AB (ref >90)
GFR, EST NON AFRICAN AMERICAN: 35 mL/min — AB (ref >90)
GLUCOSE: 131 mg/dL — AB (ref 70–99)
POTASSIUM: 3.9 meq/L (ref 3.7–5.3)
Sodium: 144 mEq/L (ref 137–147)

## 2014-02-03 LAB — PRO B NATRIURETIC PEPTIDE: Pro B Natriuretic peptide (BNP): 613.1 pg/mL — ABNORMAL HIGH (ref 0–125)

## 2014-02-03 LAB — URINALYSIS, ROUTINE W REFLEX MICROSCOPIC
Bilirubin Urine: NEGATIVE
GLUCOSE, UA: NEGATIVE mg/dL
KETONES UR: NEGATIVE mg/dL
NITRITE: POSITIVE — AB
PH: 5.5 (ref 5.0–8.0)
Protein, ur: NEGATIVE mg/dL
SPECIFIC GRAVITY, URINE: 1.005 (ref 1.005–1.030)
Urobilinogen, UA: 0.2 mg/dL (ref 0.0–1.0)

## 2014-02-03 LAB — I-STAT TROPONIN, ED: Troponin i, poc: 0 ng/mL (ref 0.00–0.08)

## 2014-02-03 LAB — CANCER ANTIGEN 27.29: CA 27.29: 45 U/mL — AB (ref 0–39)

## 2014-02-03 MED ORDER — FUROSEMIDE 10 MG/ML IJ SOLN
80.0000 mg | Freq: Once | INTRAMUSCULAR | Status: AC
  Administered 2014-02-03: 80 mg via INTRAVENOUS
  Filled 2014-02-03: qty 8

## 2014-02-03 MED ORDER — DEXTROSE 5 % IV SOLN
1.0000 g | Freq: Once | INTRAVENOUS | Status: AC
  Administered 2014-02-03: 1 g via INTRAVENOUS
  Filled 2014-02-03: qty 10

## 2014-02-03 MED ORDER — CEFTRIAXONE SODIUM 1 G IJ SOLR: 1.0000 g | Freq: Once | INTRAMUSCULAR | Status: DC

## 2014-02-03 MED ORDER — TECHNETIUM TC 99M DIETHYLENETRIAME-PENTAACETIC ACID
39.0000 | Freq: Once | INTRAVENOUS | Status: AC | PRN
  Administered 2014-02-03: 39 via INTRAVENOUS

## 2014-02-03 MED ORDER — NITROGLYCERIN 2 % TD OINT
1.0000 [in_us] | TOPICAL_OINTMENT | Freq: Once | TRANSDERMAL | Status: DC
  Filled 2014-02-03: qty 30

## 2014-02-03 MED ORDER — NITROGLYCERIN 2 % TD OINT
0.5000 [in_us] | TOPICAL_OINTMENT | Freq: Once | TRANSDERMAL | Status: AC
  Administered 2014-02-03: 0.5 [in_us] via TOPICAL

## 2014-02-03 MED ORDER — TECHNETIUM TO 99M ALBUMIN AGGREGATED
5.4000 | Freq: Once | INTRAVENOUS | Status: AC | PRN
  Administered 2014-02-03: 5 via INTRAVENOUS

## 2014-02-03 NOTE — ED Notes (Signed)
Pt still in [place] 

## 2014-02-03 NOTE — ED Notes (Signed)
Patient transported to MRI 

## 2014-02-03 NOTE — Progress Notes (Signed)
Non pitting edema of bilateral lower extremities noted. Patient denies pain. Reports taking Lasix 80 mg once per day. Patient has CHF and has been hospitalized once in the past "to get the fluid off her." Patient scheduled to follow up with PCP Dr. Lacinda Axon at [facility] on 02/19/2014. Shortness of breath at rest noted.

## 2014-02-03 NOTE — ED Notes (Signed)
Back from [place].

## 2014-02-03 NOTE — Progress Notes (Signed)
  Radiation Oncology         [phone] ________________________________  Name: Anita [name] MRN: [id number]  Date: 02/03/2014  DOB: 1946/04/19  Weekly Radiation Therapy Management  Current Dose: 6 Gy     Planned Dose:  30 Gy directed at both femurs  Narrative . . . . . . . . The patient presents for routine under treatment assessment.                                   The patient is seen today for weekly assessment. In addition the patient is having progressive breathing problems. Patient recently was found to have a pathologic fracture of her right humerus and will see orthopedic surgery next week for management issues concerning this area. She denies any significant pain in this area she does have a sling in place. She denies any significant pain in her upper legs today.                                 Set-up films were reviewed.                                 The chart was checked. Physical Findings. . .  weight is 234 lb 4.8 oz (106.278 kg). Her blood pressure is 103/60 and her pulse is 84. Her respiration is 22 and oxygen saturation is 90%. . She has pitting edema in the lower extremities. Patient does admit to some weepage from this area. Breath sounds in general are distant. The heart has a regular rhythm and rate. Impression . . . . . . . The patient is tolerating radiation. Plan . . . . . . . . . . . . Continue treatment as planned. The patient's breathing is of concern as well as oxygen saturation. She may be developing congestive heart failure again.  She is at risk for pulmonary embolus. I discussed her case with medical oncology and the patient will be transported to the emergency room for further evaluation.  ________________________________   Blair Promise, PhD, MD

## 2014-02-03 NOTE — ED Provider Notes (Signed)
CSN: [id number]     Arrival date & time 02/03/14  1435 History   First MD Initiated Contact with Patient 02/03/14 1503     Chief Complaint  Patient presents with  . Leg Swelling     (Consider location/radiation/quality/duration/timing/severity/associated sxs/prior Treatment) The history is provided by the patient.    Anita [name] is a 68 y.o. female with a history significant for metastatic breast cancer, renal insufficiency and CHF presenting with a 1 week history of increasing bilateral lower extremity edema along with increased shortness of breath, both orthopnea and with exertion.  She denies chest pain, altough does report mid sternal chest pain after eating which improves with drinking fluids.  She was receiving palliative radiation today for bones mets to her left upper leg today when she was sent to the ed for further workup.  She denies fevers or chills, denies cough, nausea, vomiting or other complaint.  She does take lasix 80 mg daily.  She uses oxygen prn.     Past Medical History  Diagnosis Date  . Fibroid   . Morbid obesity   . 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 COPD (chronic obstructive pulmonary disease)   . Chronic diastolic CHF (congestive heart failure)   . NSTEMI (non-ST elevated myocardial infarction)     Cath November 2014 LAD 40% stenosis, first diagonal 80% stenosis, circumflex 20% stenosis, right coronary artery occluded. The EF was 35-40% at that time.  . Anemia     a. Adm 09/2012 with melena, Hgb 5.8 -> transfused. EGD/colonoscopy unrevealing.  . Breast cancer     a. Mets to bone. ER 100%/ PR 0%/Her2 neu negative.  [name] Ulcers of both lower legs   . Tobacco abuse   . Hyperlipidemia 02/11/2013  . Diabetes mellitus without complication   . Chronic respiratory failure     a. On O2 qhs. also portable O2.  . Chronic renal insufficiency   . Lesion of vocal cord     a. CT 05/2013 concerning for tumor.  . Shortness of  breath    Past Surgical History  Procedure Laterality Date  . Colonoscopy, esophagogastroduodenoscopy (egd) and esophageal dilation N/A 10/13/2012    Procedure: colonoscopy and egd;  Surgeon: Wonda Horner, MD;  Location: [facility];  Service: Endoscopy;  Laterality: N/A;   Family History  Problem Relation Age of Onset  . Cancer Mother     leukemia  . Hypertension Mother   . Diabetes Father   . Heart disease Father     passed away due to heart attack   History  Substance Use Topics  . Smoking status: Former Smoker -- 2.00 packs/day for 40 years    Types: Cigarettes    Quit date: 09/24/2012  . Smokeless tobacco: Never Used  . Alcohol Use: No   OB History   Grav Para Term Preterm Abortions TAB SAB Ect Mult Living   _0 Review of Systems  Constitutional: Negative for fever and chills.  HENT: Negative.  Negative for congestion and sore throat.   Eyes: Negative.   Respiratory: Positive for shortness of breath and wheezing. Negative for cough and chest tightness.   Cardiovascular: Positive for leg swelling. Negative for chest pain.  Gastrointestinal: Negative for nausea and abdominal pain.  Genitourinary: Negative.   Musculoskeletal: Negative for arthralgias, joint swelling and neck pain.  Skin: Negative.  Negative  for rash and wound.  Neurological: Negative for dizziness, weakness, light-headedness, numbness and headaches.  Psychiatric/Behavioral: Negative.       Allergies  Omnipaque and Benzene  Home Medications   Prior to Admission medications   Medication Sig Start Date End Date Taking? Authorizing Provider  albuterol (PROVENTIL HFA;VENTOLIN HFA) 108 (90 BASE) MCG/ACT inhaler Inhale 1 puff into the lungs 2 (two) times daily as needed for wheezing or shortness of breath.   Yes Historical Provider, MD  aspirin EC 81 MG EC tablet Take 1 tablet (81 mg total) by mouth daily. 10/02/12  Yes Leone Haven, MD  atorvastatin (LIPITOR) 40 MG tablet Take 1  tablet (40 mg total) by mouth daily at 6 PM. 01/13/14  Yes Nobie Putnam, DO  furosemide (LASIX) 80 MG tablet Take 80 mg by mouth daily.   Yes Historical Provider, MD  HYDROcodone-acetaminophen (NORCO/VICODIN) 5-325 MG per tablet Take 2 tablets by mouth every 4 (four) hours as needed for moderate pain. 01/13/14  Yes Langston Masker, MD  isosorbide mononitrate (IMDUR) 30 MG 24 hr tablet Take 1 tablet (30 mg total) by mouth daily. 01/13/14  Yes Nobie Putnam, DO  letrozole [facility]) 2.5 MG tablet Take 1 tablet (2.5 mg total) by mouth daily. 01/13/14  Yes Alexander Parks Ranger, DO  mirtazapine (REMERON) 15 MG tablet Take 1 tablet (15 mg total) by mouth at bedtime. 01/13/14  Yes Alexander Parks Ranger, DO  nitroGLYCERIN (NITROSTAT) 0.4 MG SL tablet Place 1 tablet (0.4 mg total) under the tongue every 5 (five) minutes as needed for chest pain. 03/17/13  Yes Cordelia Poche, MD  palbociclib [facility]) 125 MG capsule Take 125 mg by mouth daily with breakfast. Take whole with food.   Yes Historical Provider, MD  potassium chloride SA (K-DUR,KLOR-CON) 20 MEQ tablet Take 1-2 tablets (20-40 mEq total) by mouth 2 (two) times daily. 2 tablets (40 mEq) AM and 1 tablet ( 20 mEq) PM 01/13/14  Yes Nobie Putnam, DO  Umeclidinium-Vilanterol 62.5-25 MCG/INH AEPB Inhale 1 puff into the lungs as needed (for COPD). 08/12/13  Yes Chesley Mires, MD  VITAMIN D, CHOLECALCIFEROL, PO Take 1,000 mg by mouth daily.    Yes Historical Provider, MD  NON FORMULARY Place 3 L into the nose as needed (oxygen).     Historical Provider, MD   BP 130/65  Pulse 88  Temp(Src) 98.7 F (37.1 C) (Oral)  Resp 18  SpO2 100% Physical Exam  Nursing note and vitals reviewed. Constitutional: She appears well-developed and well-nourished.  Morbidly obese.  HENT:  Head: Normocephalic and atraumatic.  Eyes: Conjunctivae are normal.  Neck: Normal range of motion.  Cardiovascular: Normal rate, regular rhythm, normal heart sounds and  intact distal pulses.   Pulmonary/Chest: Effort normal. She has wheezes. She has rales.  Rales in lower fields, wheezing upper, decreased breath sounds throughout.    Abdominal: Soft. Bowel sounds are normal. There is no tenderness.  Musculoskeletal: Normal range of motion.  Neurological: She is alert.  Skin: Skin is warm and dry.  Psychiatric: She has a normal mood and affect.    ED Course  Procedures (including critical care time) Labs Review Labs Reviewed  CBC - Abnormal; Notable for the following:    WBC 2.5 (*)    RBC 3.82 (*)    Hemoglobin 9.7 (*)    HCT 29.8 (*)    MCH 25.4 (*)    RDW 18.4 (*)    All other components within normal limits  BASIC METABOLIC PANEL - Abnormal; Notable for  the following:    Glucose, Bld 131 (*)    BUN 26 (*)    Creatinine, Ser 1.49 (*)    GFR calc non Af Amer 35 (*)    GFR calc Af Amer 41 (*)    All other components within normal limits  PRO B NATRIURETIC PEPTIDE - Abnormal; Notable for the following:    Pro B Natriuretic peptide (BNP) 613.1 (*)    All other components within normal limits  URINALYSIS, ROUTINE W REFLEX MICROSCOPIC - Abnormal; Notable for the following:    Hgb urine dipstick SMALL (*)    Nitrite POSITIVE (*)    Leukocytes, UA LARGE (*)    All other components within normal limits  URINE MICROSCOPIC-ADD ON - Abnormal; Notable for the following:    Bacteria, UA FEW (*)    All other components within normal limits  URINE CULTURE  I-STAT TROPOININ, ED    Imaging Review Nm Pulmonary Perf And Vent  02/03/2014   CLINICAL DATA:  Shortness of breath for 1 week.  Bone cancer.  EXAM: NUCLEAR MEDICINE VENTILATION - PERFUSION LUNG SCAN  TECHNIQUE: Ventilation images were obtained in multiple projections using inhaled aerosol technetium 99 M DTPA. Perfusion images were obtained in multiple projections after intravenous injection of Tc-67mMAA.  RADIOPHARMACEUTICALS:  39 mCi Tc-954mTPA aerosol and 5.4 mCi Tc-9948mA  COMPARISON:   Chest 02/03/2014  FINDINGS: Ventilation: No focal ventilation defect.  Perfusion: No wedge shaped peripheral perfusion defects to suggest acute pulmonary embolism.  IMPRESSION: Low probability of pulmonary embolus.   Electronically Signed   By: WilLucienne CapersD.   On: 02/03/2014 21:35   Dg Chest Port 1 View  02/03/2014   CLINICAL DATA:  Leg swelling after radiation therapy. History of breast cancer and multiple myeloma.  EXAM: PORTABLE CHEST - 1 VIEW  COMPARISON:  01/09/2014 and chest CTA 05/13/2013 and chest radiograph 07/07/2013  FINDINGS: Single view of the chest was obtained. No significant pulmonary edema or airspace disease. Heart and mediastinum are stable. Again noted is a lucent lesion involving the lateral right clavicle and the right acromion. Difficult to exclude lesions in the proximal humerus bilaterally. There is also a lucent lesion involving the left seventh rib. These lesions were present on the previous chest radiograph. However, the left seventh rib appears to be mildly displaced and difficult to exclude a pathologic fracture at this location.  IMPRESSION: No acute cardiopulmonary disease.  Re-demonstration of multiple lucent bone lesions. Findings are compatible with history of multiple myeloma. Difficult to exclude a pathologic fracture involving the left seventh rib.   Electronically Signed   By: AdaMarkus DaftD.   On: 02/03/2014 15:31     EKG Interpretation None      MDM   Final diagnoses:  CHF (congestive heart failure)  UTI (lower urinary tract infection)     Patients labs and/or radiological studies were viewed and considered during the medical decision making and disposition process.  Call to Dr. [facility]Lamount [facility] for admission.  He would prefer VQ scan results [facility]. Call [facility] back with results.  [facility]chf, uti.     JulEvalee JeffersonA-C 02/03/14 2242

## 2014-02-03 NOTE — ED Notes (Addendum)
Pt was sent here from radiation.  States that she has been having peripheral edema x 1 wk, SOB x 2 days.  On 3 L of 02 normally at home but did not come on 02.  Pt has pathological fracture from CA to rt arm.  Pt is in cast.  Takes 80 mg of lasix per day.

## 2014-02-03 NOTE — ED Notes (Signed)
Drink and sandwich given to pt, okayed by Almyra Free EDPA

## 2014-02-03 NOTE — ED Notes (Signed)
Transported to [place] 

## 2014-02-03 NOTE — H&P (Signed)
[facility] Admission History and Physical Service Pager: [phone]  Patient name: Anita [name] Medical record number: [id number] Date of birth: [birth year] Age: 68 y.o. Gender: female  Primary Care Provider: [name], DO Consultants: None Code Status: Full code  Chief Complaint: Bilateral leg swelling  Assessment and Plan: Anita [name] is a 68 y.o. female presenting with bilateral leg swelling . PMH is significant for COPD, sCHF/dCHF, DM, previous NSTEMI s/p cath and stage 4 breast cancer  # Leg swelling/shortness of breath: With patient's history of CHF, could be exacerbation. DVT and PE also on differential, however, PE worked up at ED with V/Q scan read as low probability for PE. No lower extremity dopplers performed. ProBNP elevated from previous. Possible triggers include cessation of losartan, no evidence for URI.  Dry weight: 235-238. Patient below dry weight at 224lbs on admission. Concern for DVT, although bilateral, patient has cancer and risk for hypercoagulable state. Wells' DVT: 4  Admit to telemetry, attending Dr. Andria Frames  Hold furosemide tonight  F/u bmet in AM  Strict in/out  Daily weights  Sodium restriction  Follow-up AM EKG  Echocardiogram  Lower extremity duplex  TED hose  Elevate legs  Routine vitals  Pulse ox to keep O2 sats >92%  Continue lasix $RemoveBefore'80mg'KpzKbvYoaKUHg$  IV daily for now, reassess based on fluid status and I&Os  Consider restarting losartan once improved  # Leukopenia: WBCs of 2.5 on admission. Patient currently afebrile. Upon chart review, possibly related to patient starting Ibrance (palbociclib). Patient has microcytic anemia which is chronic. All cell lines not low, so concern for aplastic anemia lower.  Follow-up AM CBC with diff  Monitor patient's fever curve  # CAD: Cath November 2014 LAD 40% stenosis, first diagonal 80% stenosis, circumflex 20% stenosis, right coronary artery occluded. The EF was 35-40% at  that time. Losartan discontinued upon discharge of last admission.   Continue aspirin, imdur  # COPD: on 2L [place] at night and for ambulation although patient uses 3L at night and 4L while ambulating for comfort. Uses Umeclidinium-Vilanterol PRN. Has scattered wheezes on exam  Albuterol PRN  # Diabetes mellitus, type 2: metformin discontinued per discharge summary of last admission. Currently managed with diet  Sensitive sliding scale insulin  # CKD: baseline creatinine of around 1.3. Creatinine 1.49 on admission.  Follow-up Bmet in AM  # Stage 4 Breast cancer: currently undergoes palliative radiation. Has a pathologic fracture of right humerus. Per chart review, patient also receives Zometa outpatient.  Patient scheduled for radiation tomorrow.   Continue Ibrance and Femera  # Asymptomatic bacteria: UA in ED significant for leukocyte esterase and bacteria. Patient is asymptomatic. She is s/p ceftriaxone.  Follow-up urine culture  # Chronic leg pain: takes Norco outpatient  Norco 1-2 q4hrs PRN for pain  # Hx of poor appetite: currently on Remeron  Continue Remeron $RemoveBeforeDE'15mg'GQVYBsmqEqpFhQj$  qhs  FEN/GI: SLIV, heart healthy/carb modified Prophylaxis: Heparin subq  Disposition: Admit to telemetry  History of Present Illness: Anita [name] is a 68 y.o. female presenting with bilateral leg swelling. Patient states that she first noticed symptoms about one week ago. She has been taking lasix $RemoveBefo'40mg'RemiPfmuppz$  which did not help with the swelling. She saw her oncologist yesterday who suggested she increase her dose to $Remov'80mg'dvUuqH$ , however, she states that did not help either. She has also had associated worsening shortness of breath. She now gets short of breath when standing up from a seated position, which is new. She denies any orthopnea or  SOB at rest.  No cough or chest pain. No identifiable trigger. She states she has been in a nursing, which she dislikes, as they want her to stay in bed all the time and they stopped her  losartan and metformin.  Patient seen at her radiation oncologist's office today and was found to have severe lower extremity swelling and sent patient to the [facility] ED. Patient was worked up for PE and felt to have a CHF exacerbation with transfer to [facility] for inpatient admission. UA obtained which was positive for nitrates and bacteria and patient given one dose of ceftriaxone. Patient also given lasix $RemoveBef'80mg'OJmsbGIsos$  IV and diuresed about 3L.  Review Of Systems: Per HPI with the following additions: No dysuria, hesitancy, hematuria. No constipation or diarrhea. No fevers. Otherwise 12 point review of systems was performed and was unremarkable.  Patient Active Problem List   Diagnosis Date Noted  . CHF (congestive heart failure) 02/03/2014  . Closed right arm fracture 01/10/2014  . Weakness 01/10/2014  . Malodorous urine 12/06/2013  . Intertrigo 08/16/2013  . Vaginal yeast infection 08/16/2013  . 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 Insomnia 03/23/2013  . Hyperlipidemia 02/11/2013  . Breast cancer metastasized to bone 10/14/2012  . Chronic respiratory failure 10/06/2012  . Chronic combined systolic and diastolic congestive heart failure 10/06/2012  . CAD (coronary artery disease) 10/06/2012  . Chronic kidney disease 10/06/2012  . Anemia of chronic disease 10/06/2012  . Breast mass, right 10/06/2012  . History of tobacco abuse 10/06/2012  . COPD (chronic obstructive pulmonary disease) 09/27/2012  . DM2 (diabetes mellitus, type 2) 09/26/2012   Past Medical History: Past Medical History  Diagnosis Date  . Fibroid   . Morbid obesity   . 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 COPD (chronic obstructive pulmonary disease)   . Chronic diastolic CHF (congestive heart failure)   . NSTEMI (non-ST elevated myocardial infarction)     Cath November 2014 LAD 40% stenosis, first diagonal 80% stenosis, circumflex 20% stenosis, right coronary artery occluded. The EF  was 35-40% at that time.  . Anemia     a. Adm 09/2012 with melena, Hgb 5.8 -> transfused. EGD/colonoscopy unrevealing.  . Breast cancer     a. Mets to bone. ER 100%/ PR 0%/Her2 neu negative.  [name] Ulcers of both lower legs   . Tobacco abuse   . Hyperlipidemia 02/11/2013  . Diabetes mellitus without complication   . Chronic respiratory failure     a. On O2 qhs. also portable O2.  . Chronic renal insufficiency   . Lesion of vocal cord     a. CT 05/2013 concerning for tumor.  . Shortness of breath    Past Surgical History: Past Surgical History  Procedure Laterality Date  . Colonoscopy, esophagogastroduodenoscopy (egd) and esophageal dilation N/A 10/13/2012    Procedure: colonoscopy and egd;  Surgeon: Wonda Horner, MD;  Location: [facility];  Service: Endoscopy;  Laterality: N/A;   Social History: History  Substance Use Topics  . Smoking status: Former Smoker -- 2.00 packs/day for 40 years    Types: Cigarettes    Quit date: 09/24/2012  . Smokeless tobacco: Never Used  . Alcohol Use: No   Additional social history: None  Please also refer to relevant sections of EMR.  Family History: Family History  Problem Relation Age of Onset  . Cancer Mother     leukemia  . Hypertension Mother   .  Diabetes Father   . Heart disease Father     passed away due to heart attack   Allergies and Medications: Allergies  Allergen Reactions  . Omnipaque [Iohexol]     Pt claims she developed hives after given contrast  . Benzene Rash   No current facility-administered medications on file prior to encounter.   Current Outpatient Prescriptions on File Prior to Encounter  Medication Sig Dispense Refill  . albuterol (PROVENTIL HFA;VENTOLIN HFA) 108 (90 BASE) MCG/ACT inhaler Inhale 1 puff into the lungs 2 (two) times daily as needed for wheezing or shortness of breath.      [name] aspirin EC 81 MG EC tablet Take 1 tablet (81 mg total) by mouth daily.  30 tablet  3  . atorvastatin (LIPITOR) 40 MG  tablet Take 1 tablet (40 mg total) by mouth daily at 6 PM.  30 tablet  0  . HYDROcodone-acetaminophen (NORCO/VICODIN) 5-325 MG per tablet Take 2 tablets by mouth every 4 (four) hours as needed for moderate pain.  30 tablet  0  . isosorbide mononitrate (IMDUR) 30 MG 24 hr tablet Take 1 tablet (30 mg total) by mouth daily.  90 tablet  0  . letrozole (FEMARA) 2.5 MG tablet Take 1 tablet (2.5 mg total) by mouth daily.  90 tablet  3  . mirtazapine (REMERON) 15 MG tablet Take 1 tablet (15 mg total) by mouth at bedtime.  30 tablet  0  . nitroGLYCERIN (NITROSTAT) 0.4 MG SL tablet Place 1 tablet (0.4 mg total) under the tongue every 5 (five) minutes as needed for chest pain.  30 tablet  0  . palbociclib (IBRANCE) 125 MG capsule Take 125 mg by mouth daily with breakfast. Take whole with food.      . potassium chloride SA (K-DUR,KLOR-CON) 20 MEQ tablet Take 1-2 tablets (20-40 mEq total) by mouth 2 (two) times daily. 2 tablets (40 mEq) AM and 1 tablet ( 20 mEq) PM      . Umeclidinium-Vilanterol 62.5-25 MCG/INH AEPB Inhale 1 puff into the lungs as needed (for COPD).      [name] VITAMIN D, CHOLECALCIFEROL, PO Take 1,000 mg by mouth daily.       . NON FORMULARY Place 3 L into the nose as needed (oxygen).         Objective: BP 126/60  Pulse 88  Temp(Src) 98.7 F (37.1 C) (Oral)  Resp 17  SpO2 100%  Exam: General: Laying in bed, pleasant, in no acute distress, daughter at bedside HEENT: EOMI, moist mucous membranes Cardiovascular: distant heart sounds; Regular rate and rhythm, no murmur, pedal pulses 1+ Respiratory: Scattered wheezes bilaterally, mild crackles at bases, decreased breath sounds bilaterally most likely attributable to BMI Abdomen: Soft, slightly distended, non-tender, +bowel sounds Extremities: Right arm in cast. Bilateral leg tenderness with significant edema and warmth; 3+ edema to at least the knees bilaterally Skin: Warm, dry, no weeping noticed. Left leg has area of erythema Neuro: Alert  and oriented x3  Labs and Imaging: CBC BMET   Recent Labs Lab 02/03/14 1452  WBC 2.5*  HGB 9.7*  HCT 29.8*  PLT 205    Recent Labs Lab 02/03/14 1452  NA 144  K 3.9  CL 103  CO2 26  BUN 26*  CREATININE 1.49*  GLUCOSE 131*  CALCIUM 9.5     Pro B Natriuretic peptide (BNP)  Date Value Ref Range Status  02/03/2014 613.1* 0 - 125 pg/mL Final   Urinalysis    Component Value Date/Time  COLORURINE YELLOW 02/03/2014 [place] 02/03/2014 1552   LABSPEC 1.005 02/03/2014 1552   PHURINE 5.5 02/03/2014 1552   GLUCOSEU NEGATIVE 02/03/2014 1552   HGBUR SMALL* 02/03/2014 1552   BILIRUBINUR NEGATIVE 02/03/2014 1552   BILIRUBINUR NEG 12/04/2013 1636   KETONESUR NEGATIVE 02/03/2014 1552   PROTEINUR NEGATIVE 02/03/2014 1552   PROTEINUR NEG 12/04/2013 1636   UROBILINOGEN 0.2 02/03/2014 1552   UROBILINOGEN 0.2 12/04/2013 1636   NITRITE POSITIVE* 02/03/2014 1552   NITRITE POSITIVE 12/04/2013 1636   LEUKOCYTESUR LARGE* 02/03/2014 1552      Nm Pulmonary Perf And Vent  02/03/2014   CLINICAL DATA:  Shortness of breath for 1 week.  Bone cancer.  EXAM: NUCLEAR MEDICINE VENTILATION - PERFUSION LUNG SCAN  TECHNIQUE: Ventilation images were obtained in multiple projections using inhaled aerosol technetium 99 M DTPA. Perfusion images were obtained in multiple projections after intravenous injection of Tc-70m MAA.  RADIOPHARMACEUTICALS:  39 mCi Tc-52m DTPA aerosol and 5.4 mCi Tc-2m MAA  COMPARISON:  Chest 02/03/2014  FINDINGS: Ventilation: No focal ventilation defect.  Perfusion: No wedge shaped peripheral perfusion defects to suggest acute pulmonary embolism.  IMPRESSION: Low probability of pulmonary embolus.   Electronically Signed   By: Lucienne Capers M.D.   On: 02/03/2014 21:35   Dg Chest Port 1 View  02/03/2014   CLINICAL DATA:  Leg swelling after radiation therapy. History of breast cancer and multiple myeloma.  EXAM: PORTABLE CHEST - 1 VIEW  COMPARISON:  01/09/2014 and chest CTA  05/13/2013 and chest radiograph 07/07/2013  FINDINGS: Single view of the chest was obtained. No significant pulmonary edema or airspace disease. Heart and mediastinum are stable. Again noted is a lucent lesion involving the lateral right clavicle and the right acromion. Difficult to exclude lesions in the proximal humerus bilaterally. There is also a lucent lesion involving the left seventh rib. These lesions were present on the previous chest radiograph. However, the left seventh rib appears to be mildly displaced and difficult to exclude a pathologic fracture at this location.  IMPRESSION: No acute cardiopulmonary disease.  Re-demonstration of multiple lucent bone lesions. Findings are compatible with history of multiple myeloma. Difficult to exclude a pathologic fracture involving the left seventh rib.   Electronically Signed   By: Markus Daft M.D.   On: 02/03/2014 15:31    Cordelia Poche, MD 02/03/2014, 11:01 PM PGY-1, [place] Intern pager: [phone], text pages welcome  PGY-Addendum I have seen and examined this patient.  I agree with the above note with my edits in pink.  Semira Stoltzfus 02/04/2014, 6:37 AM

## 2014-02-03 NOTE — ED Notes (Signed)
[facility] arrived  

## 2014-02-03 NOTE — ED Notes (Signed)
[facility] here to transport pt to [facility]  

## 2014-02-04 ENCOUNTER — Telehealth: Payer: Self-pay | Admitting: Oncology

## 2014-02-04 ENCOUNTER — Inpatient Hospital Stay (HOSPITAL_COMMUNITY): Payer: Medicare HMO

## 2014-02-04 ENCOUNTER — Ambulatory Visit: Payer: Medicare HMO

## 2014-02-04 ENCOUNTER — Encounter (HOSPITAL_COMMUNITY): Payer: Self-pay | Admitting: Surgery

## 2014-02-04 DIAGNOSIS — I509 Heart failure, unspecified: Secondary | ICD-10-CM

## 2014-02-04 DIAGNOSIS — M79609 Pain in unspecified limb: Secondary | ICD-10-CM

## 2014-02-04 DIAGNOSIS — M7989 Other specified soft tissue disorders: Secondary | ICD-10-CM

## 2014-02-04 LAB — COMPREHENSIVE METABOLIC PANEL
ALT: 16 U/L (ref 0–35)
AST: 18 U/L (ref 0–37)
Albumin: 3.5 g/dL (ref 3.5–5.2)
Alkaline Phosphatase: 153 U/L — ABNORMAL HIGH (ref 39–117)
BUN: 28 mg/dL — AB (ref 6–23)
CO2: 22 meq/L (ref 19–32)
Calcium: 9.1 mg/dL (ref 8.4–10.5)
Chloride: 97 mEq/L (ref 96–112)
Creatinine, Ser: 1.63 mg/dL — ABNORMAL HIGH (ref 0.50–1.10)
GFR calc Af Amer: 37 mL/min — ABNORMAL LOW (ref >90)
GFR, EST NON AFRICAN AMERICAN: 32 mL/min — AB (ref >90)
GLUCOSE: 133 mg/dL — AB (ref 70–99)
POTASSIUM: 3.4 meq/L — AB (ref 3.7–5.3)
SODIUM: 140 meq/L (ref 137–147)
Total Bilirubin: 0.4 mg/dL (ref 0.3–1.2)
Total Protein: 7.1 g/dL (ref 6.0–8.3)

## 2014-02-04 LAB — CBC WITH DIFFERENTIAL/PLATELET
Basophils Absolute: 0 10*3/uL (ref 0.0–0.1)
Basophils Relative: 0 % (ref 0–1)
EOS ABS: 0 10*3/uL (ref 0.0–0.7)
Eosinophils Relative: 2 % (ref 0–5)
HCT: 30.5 % — ABNORMAL LOW (ref 36.0–46.0)
Hemoglobin: 9.6 g/dL — ABNORMAL LOW (ref 12.0–15.0)
LYMPHS ABS: 0.7 10*3/uL (ref 0.7–4.0)
Lymphocytes Relative: 26 % (ref 12–46)
MCH: 24.8 pg — AB (ref 26.0–34.0)
MCHC: 31.5 g/dL (ref 30.0–36.0)
MCV: 78.8 fL (ref 78.0–100.0)
Monocytes Absolute: 0.2 10*3/uL (ref 0.1–1.0)
Monocytes Relative: 6 % (ref 3–12)
Neutro Abs: 1.7 10*3/uL (ref 1.7–7.7)
Neutrophils Relative %: 66 % (ref 43–77)
PLATELETS: 187 10*3/uL (ref 150–400)
RBC: 3.87 MIL/uL (ref 3.87–5.11)
RDW: 18.8 % — ABNORMAL HIGH (ref 11.5–15.5)
WBC: 2.5 10*3/uL — AB (ref 4.0–10.5)

## 2014-02-04 LAB — GLUCOSE, CAPILLARY
GLUCOSE-CAPILLARY: 107 mg/dL — AB (ref 70–99)
GLUCOSE-CAPILLARY: 143 mg/dL — AB (ref 70–99)
Glucose-Capillary: 126 mg/dL — ABNORMAL HIGH (ref 70–99)
Glucose-Capillary: 221 mg/dL — ABNORMAL HIGH (ref 70–99)
Glucose-Capillary: 110 mg/dL — ABNORMAL HIGH (ref 70–99)

## 2014-02-04 LAB — MRSA PCR SCREENING: MRSA by PCR: NEGATIVE

## 2014-02-04 MED ORDER — HYDROCODONE-ACETAMINOPHEN 5-325 MG PO TABS: 2.0000 | ORAL_TABLET | ORAL | Status: DC | PRN

## 2014-02-04 MED ORDER — SODIUM CHLORIDE 0.9 % IJ SOLN
3.0000 mL | Freq: Two times a day (BID) | INTRAMUSCULAR | Status: DC
  Administered 2014-02-04 – 2014-02-05 (×3): 3 mL via INTRAVENOUS

## 2014-02-04 MED ORDER — SODIUM CHLORIDE 0.9 % IJ SOLN: 3.0000 mL | INTRAMUSCULAR | Status: DC | PRN

## 2014-02-04 MED ORDER — ASPIRIN EC 81 MG PO TBEC
81.0000 mg | DELAYED_RELEASE_TABLET | Freq: Every day | ORAL | Status: DC
  Administered 2014-02-04 – 2014-02-07 (×4): 81 mg via ORAL
  Filled 2014-02-04 (×4): qty 1

## 2014-02-04 MED ORDER — ALBUTEROL SULFATE (2.5 MG/3ML) 0.083% IN NEBU: 3.0000 mL | INHALATION_SOLUTION | RESPIRATORY_TRACT | Status: DC | PRN

## 2014-02-04 MED ORDER — SODIUM CHLORIDE 0.9 % IV SOLN: 250.0000 mL | INTRAVENOUS | Status: DC | PRN

## 2014-02-04 MED ORDER — SODIUM CHLORIDE 0.9 % IJ SOLN
3.0000 mL | Freq: Two times a day (BID) | INTRAMUSCULAR | Status: DC
  Administered 2014-02-04 – 2014-02-07 (×7): 3 mL via INTRAVENOUS

## 2014-02-04 MED ORDER — VITAMIN D (CHOLECALCIFEROL) 25 MCG (1000 UT) PO TABS
1000.0000 mg | ORAL_TABLET | Freq: Every day | ORAL | Status: DC
  Administered 2014-02-06 – 2014-02-07 (×2): 1000 mg via ORAL
  Filled 2014-02-04 (×6): qty 1000

## 2014-02-04 MED ORDER — MIRTAZAPINE 15 MG PO TABS
15.0000 mg | ORAL_TABLET | Freq: Every day | ORAL | Status: DC
  Administered 2014-02-04 – 2014-02-06 (×3): 15 mg via ORAL
  Filled 2014-02-04 (×5): qty 1

## 2014-02-04 MED ORDER — FUROSEMIDE 10 MG/ML IJ SOLN
80.0000 mg | Freq: Every day | INTRAMUSCULAR | Status: DC
  Administered 2014-02-04 – 2014-02-05 (×2): 80 mg via INTRAVENOUS
  Filled 2014-02-04 (×2): qty 8

## 2014-02-04 MED ORDER — ONDANSETRON HCL 4 MG/2ML IJ SOLN: 4.0000 mg | Freq: Three times a day (TID) | INTRAMUSCULAR | Status: AC | PRN

## 2014-02-04 MED ORDER — ISOSORBIDE MONONITRATE ER 30 MG PO TB24
30.0000 mg | ORAL_TABLET | Freq: Every day | ORAL | Status: DC
  Administered 2014-02-04 – 2014-02-07 (×4): 30 mg via ORAL
  Filled 2014-02-04 (×4): qty 1

## 2014-02-04 MED ORDER — LETROZOLE 2.5 MG PO TABS
2.5000 mg | ORAL_TABLET | Freq: Every day | ORAL | Status: DC
  Administered 2014-02-04 – 2014-02-07 (×4): 2.5 mg via ORAL
  Filled 2014-02-04 (×4): qty 1

## 2014-02-04 MED ORDER — PALBOCICLIB 125 MG PO CAPS
125.0000 mg | ORAL_CAPSULE | Freq: Every day | ORAL | Status: DC
  Administered 2014-02-04 – 2014-02-07 (×4): 125 mg via ORAL
  Filled 2014-02-04 (×2): qty 1

## 2014-02-04 MED ORDER — HYDROCODONE-ACETAMINOPHEN 5-325 MG PO TABS
1.0000 | ORAL_TABLET | ORAL | Status: DC | PRN
  Administered 2014-02-04 – 2014-02-07 (×2): 1 via ORAL
  Filled 2014-02-04 (×3): qty 1

## 2014-02-04 MED ORDER — HEPARIN SODIUM (PORCINE) 5000 UNIT/ML IJ SOLN
5000.0000 [IU] | Freq: Three times a day (TID) | INTRAMUSCULAR | Status: DC
  Administered 2014-02-04 – 2014-02-07 (×10): 5000 [IU] via SUBCUTANEOUS
  Filled 2014-02-04 (×13): qty 1

## 2014-02-04 MED ORDER — INSULIN ASPART 100 UNIT/ML ~~LOC~~ SOLN
0.0000 [IU] | Freq: Three times a day (TID) | SUBCUTANEOUS | Status: DC
  Administered 2014-02-04: 1 [IU] via SUBCUTANEOUS
  Administered 2014-02-04: 2 [IU] via SUBCUTANEOUS
  Administered 2014-02-04 – 2014-02-06 (×6): 1 [IU] via SUBCUTANEOUS
  Administered 2014-02-07: 2 [IU] via SUBCUTANEOUS
  Administered 2014-02-07: 1 [IU] via SUBCUTANEOUS
  Administered 2014-02-07: 2 [IU] via SUBCUTANEOUS

## 2014-02-04 MED ORDER — POTASSIUM CHLORIDE CRYS ER 20 MEQ PO TBCR
40.0000 meq | EXTENDED_RELEASE_TABLET | Freq: Two times a day (BID) | ORAL | Status: AC
  Administered 2014-02-04 (×2): 40 meq via ORAL
  Filled 2014-02-04 (×2): qty 2

## 2014-02-04 MED ORDER — BIOTENE DRY MOUTH MT LIQD: 15.0000 mL | OROMUCOSAL | Status: DC | PRN

## 2014-02-04 MED ORDER — CEPHALEXIN 500 MG PO CAPS
500.0000 mg | ORAL_CAPSULE | Freq: Four times a day (QID) | ORAL | Status: DC
  Administered 2014-02-04 – 2014-02-06 (×8): 500 mg via ORAL
  Filled 2014-02-04 (×12): qty 1

## 2014-02-04 MED ORDER — ATORVASTATIN CALCIUM 40 MG PO TABS
40.0000 mg | ORAL_TABLET | Freq: Every day | ORAL | Status: DC
  Administered 2014-02-04 – 2014-02-07 (×4): 40 mg via ORAL
  Filled 2014-02-04 (×4): qty 1

## 2014-02-04 NOTE — Progress Notes (Signed)
Seen and examined.  Discussed with Dr. Raliegh Ip.  Agree with his documentation and management. See my cosign of the H&PE for my thoughts for today.

## 2014-02-04 NOTE — Progress Notes (Signed)
Orthopedic Tech Progress Note Patient Details:  Anita [name] 18-Aug-1946 [id number]  Ortho Devices Type of Ortho Device: Ace wrap;Coapt;Sugartong splint Ortho Device/Splint Location: RUE Ortho Device/Splint Interventions: Ordered;Application   Braulio Bosch 02/04/2014, 3:29 PM

## 2014-02-04 NOTE — Care Management Note (Addendum)
    Page 1 of 1   02/05/2014     4:24:24 PM CARE MANAGEMENT NOTE 02/05/2014  Patient:  Anita [name], Anita [name]   Account Number:  [id number]  Date Initiated:  02/04/2014  Documentation initiated by:  [facility]  Subjective/Objective Assessment:   68 y.o. female presenting with bilateral leg swelling . PMH is significant for COPD, sCHF/dCHF, DM, previous NSTEMI s/p cath and stage 4 breast cancer.// From [facility]     Action/Plan:   IV diureses.//Access for [facility] needs vs SNF placement.   Anticipated DC Date:  02/06/2014   Anticipated DC Plan:  [place]  CM consult      Choice offered to / List presented to:          [facility] arranged  HH-1 RN  [place] PT      [facility] agency  [place]   Status of service:  In process, will continue to follow Medicare Important Message given?  NO (If response is "NO", the following Medicare IM given date fields will be blank) Date Medicare IM given:   Date Additional Medicare IM given:    Discharge Disposition:    Per UR Regulation:  Reviewed for med. necessity/level of care/duration of stay  If discussed at [place] of Stay Meetings, dates discussed:    Comments:  02/05/14 1530 Dalonda Simoni J. Clydene Laming, RN, BSN, [place] [phone] Pt currently active with [facility] for RN/PT services.  Resumption of care requested.  [facility] [place] notified.  No DME needs identified at this time.  Please fax H&P; orders and new meds to [facility] at discharge [phone].

## 2014-02-04 NOTE — H&P (Signed)
Seen and examined.  Discussed with Dr. Bridgett Larsson.  Agree with admit and her documentation and management.  Briefly, 68 yo female with large burden of chronic disease including CHF, COPD, DM2, Stage 4 breast ca, stage 3 CKD and anemia of chronic disease presents with worsening dyspnea and leg swelling.  While we conceptualize this primarily as a CHF exacerbation, this is clearly a multifactorial problem.  She should symptomatically benefit from diuresis and we will need to carefully watch her renal function.  I favor ambulating early and hopefully prompt DC.  I do not want her to become further debilitated by laying in bed.  Another issue is that she is receiving palliative radiation and we will work with rad onc to minimize interruption of that schedule.

## 2014-02-04 NOTE — Progress Notes (Signed)
Echo Lab  2D Echocardiogram completed.  [place], RDCS 02/04/2014 10:22 AM

## 2014-02-04 NOTE — Progress Notes (Signed)
Patient transferred from the ED via stretcher to room 3E23. Patient oriented to call bell system and room equipment. Family at the bedside. VSS and patient complains of no pain or discomfort at this time. Will continue to monitor to end of shift.

## 2014-02-04 NOTE — Progress Notes (Signed)
[place] Social Work  Clinical Social Work was referred by Therapist, sports  to review and complete healthcare advance directives.  Clinical Social Worker met with patient and patient's family in [place] lobby.  The patient designated daughter [name] as their primary healthcare agent and niece Aleiyah Halpin as their secondary agent.  Patient did not complete healthcare living will.  Mrs. Bahner expressed confusion of when she would not desire life prolonging measures.  Mrs. Bonnet shared her preferences with her daughter/HCPOA- she has requested patient's daughter make decisions on her behalf if needed.  CSW encouraged patient to contact CSW if she changes her mind and would like to complete living will.    Clinical Social Worker notarized documents and made copies for patient/family. Clinical Social Worker will send documents to medical records to be scanned into patient's chart. Clinical Social Worker encouraged patient/family to contact with any additional questions or concerns.  CSW will notify patient's medical oncologist and patient has requested CSW also notify her PCP, [name], [place], MSW, [place] [phone]

## 2014-02-04 NOTE — Telephone Encounter (Signed)
Called Jeremiah's nurse, Tiffany, RN to let her know that Marlette's radiation treatment will be canceled for today and tomorrow per Dr. Sondra Come.  Tiffany verbalized agreement and will let Eilleen know.

## 2014-02-04 NOTE — Progress Notes (Signed)
OT Cancellation Note  Patient Details Name: Anita [name] MRN: [id number] DOB: 01-04-1946   Cancelled Treatment:    Reason Eval/Treat Not Completed: Patient at procedure or test/ unavailable. Pt off floor at test, OT will follow-up to assess ADL performance.   Juluis Rainier [phone] 02/04/2014, 2:04 PM

## 2014-02-04 NOTE — Progress Notes (Signed)
Family Medicine Teaching Service Daily Progress Note Intern Pager: [phone]  Patient name: Anita [name] Medical record number: [id number] Date of birth: 16-Sep-1945 Age: 68 y.o. Gender: female  Primary Care Provider: [name], DO Consultants: none Code Status: Full (confirmed on admission)  Pt Overview and Major Events to Date:   Assessment and Plan: Anita [name] is a 68 y.o. female presenting with bilateral leg swelling . PMH is significant for COPD, sCHF/dCHF, DM, previous NSTEMI s/p cath and stage 4 breast cancer. Recent hospitalization 01/10/14 to 01/13/14 for generalized weakness, R-humerus fracture, stable volume status w/o acute CHF exac.  # Dyspnea, suspected acute exac on chronic diastolic CHF - Suspected acute dCHF with peripheral edema, SOB. Last ECHO 02/2013 (EF 14-78%, Grade 1 diastolic dysfxn), elevated ProBNP 613 (prior 379, 01/10/14), POCT Trop (neg). Worked up for DVT / PT with concern for hypercoagulable state with h/o CA, Well's 4, (V/Q scan in ED with low probability for PE). H/o dry weight 235-238. - Remain on telemetry, titrate O2 - strict I/Os, daily wts (224 on admission, below dry wt - however anticipate need to re-determine new dry wt) - repeat 2D ECHO (EF 55%, paradoxical septal motion, intraventricular conduction delay) - b/l LE dopplers (negative, no DVT) - Continue Lasix $RemoveBefore'80mg'ecqEMERqdKheA$  IV daily (s/p 80 IV x 1 on admission), good UOP 3.5 L - TED hose, elevate legs, sodium restrict - Hold Losartan $RemoveBefor'25mg'eMCVsNVYoIiY$  daily (previously held ARB on recent discharge due to AKI), note no benefit in setting of diastolic CHF  # H/o R-arm humerus fracture, s/p repeat fall - Recent hospitalization 12/2013 for generalized weakness, and pathologic fracture R-humerus, no surgical intervention. Followed by Dr. Berenice Primas ([facility]) - X-rays R-humerus / R-shoulder: no acute or repeat fracture / sublux, known pathologic fractured humerus - Plan to contact [facility] for review of X-rays, and  advice on management / Ortho tech  # Hypokalemia - K 3.4 - replaced with PO K 40 mEq x 2 doses - f/u BMET  # Leukopenia - WBCs of 2.5 on admission. Patient currently afebrile. Upon chart review, possibly related to patient starting Ibrance (palbociclib). Patient has microcytic anemia which is chronic. All cell lines not low, so concern for aplastic anemia lower - repeat WBC stable, 2.5 - f/u fever curve  # AKI in setting of CKD-III - baseline creatinine of around 1.3. Creatinine 1.49 on admission. - elevated Cr 1.49 >> 1.63 - Hold nephrotoxic meds (ARB)  # Stage 4 Breast cancer, metastatic - currently undergoes palliative radiation. Has a pathologic fracture of right humerus. Per chart review, patient also receives Zometa outpatient. - Previously scheduled for radiation therapy 6/11 and 6/12, Dr. Sondra Come (canceled and notified) - Continue Ibrance and [facility]  # Asymptomatic bacteria: - UA in ED significant for leukocyte esterase and bacteria. Patient is asymptomatic. Significant recent history E.Coli UTI in 11/2013, also recent SNF stay for rehab. Suspect colonization. - s/p CTX x 1 dose in ED - start Keflex PO $RemoveB'500mg'tDKzyNbL$  QID - f/u Urine culture  # CAD: - Cath November 2014 LAD 40% stenosis, first diagonal 80% stenosis, circumflex 20% stenosis, right coronary artery occluded. The EF was 35-40% at that time. Losartan discontinued upon discharge of last admission. - Continue home ASA, imdur  # COPD: - on 2L [place] at night and for ambulation although patient uses 3L at night and 4L while ambulating for comfort. Uses Umeclidinium-Vilanterol PRN. Has scattered wheezes on exam - Albuterol PRN  # Diabetes mellitus, type 2 - metformin discontinued per  discharge summary of last admission - Currently managed with diet - SSI  # Chronic leg pain: - continue home Norco 1-2 q4hrs PRN for pain  # Hx of poor appetite: - Continue Remeron $RemoveBeforeDE'15mg'BPLkqbKtjppuZVH$  qhs  FEN/GI: SLIV, heart healthy/carb modified   Prophylaxis: Heparin subq  Disposition: Admit to telemetry, management of acute CHF exac with diuresis, further work-up, PT/OT eval, pending clinical improvement. - PT/OT - eval and treat (recent SNF discharge, pt does not wish to return)  Subjective: Reports feeling better today. Improved SOB. Denies CP, fever, or cough. Persistent LE discomfort and swelling. Good UOP voiding well.  Objective: Temp:  [98.2 F (36.8 C)-98.9 F (37.2 C)] 98.2 F (36.8 C) (06/11 0700) Pulse Rate:  [79-107] 81 (06/11 0700) Resp:  [17-22] 18 (06/11 0700) BP: (103-155)/(50-72) 106/50 mmHg (06/11 0700) SpO2:  [85 %-100 %] 94 % (06/11 0700) Weight:  [224 lb 10.4 oz (101.9 kg)-234 lb 4.8 oz (106.278 kg)] 224 lb 10.4 oz (101.9 kg) (06/11 0012) Physical Exam: General: laying in bed (raised head of bed), pleasant, cooperative, NAD Cardiovascular: distant heart sounds; RRR, no murmur, pedal pulses 1+  Respiratory: Improved breath sounds bilaterally, mild bibasilar crackles Abdomen: Soft, NTND, +active BS  Extremities: Right arm in thick ace wrap, moves fingers. Bilateral LE foot/ankle/calf tenderness with significant edema and warmth; 3+ pitting edema to at least the knees bilaterally  Skin: Warm, dry, no weeping noticed. Erythema left LE > R Neuro: Alert and oriented x3  Laboratory:  Recent Labs Lab 02/02/14 1438 02/03/14 1452 02/04/14 0540  WBC 2.8* 2.5* 2.5*  HGB 9.6* 9.7* 9.6*  HCT 30.3* 29.8* 30.5*  PLT 253 205 187    Recent Labs Lab 02/02/14 1438 02/03/14 1452 02/04/14 0540  NA 142 144 140  K 4.1 3.9 3.4*  CL  --  103 97  CO2 $Re'26 26 22  'llm$ BUN 24.2 26* 28*  CREATININE 1.5* 1.49* 1.63*  CALCIUM 9.0 9.5 9.1  PROT 6.5  --  7.1  BILITOT 0.38  --  0.4  ALKPHOS 158*  --  153*  ALT 19  --  16  AST 18  --  18  GLUCOSE 118 131* 133*   POCT Trop - 0.00 ProBNP - 613 (recent 300, 01/10/14)  Imaging/Diagnostic Tests:  6/10 Portable CXR 1v IMPRESSION:  No acute cardiopulmonary disease.   Re-demonstration of multiple lucent bone lesions. Findings are  compatible with history of multiple myeloma. Difficult to exclude a  pathologic fracture involving the left seventh rib.  6/10 NM V/Q FINDINGS:  Ventilation: No focal ventilation defect.  Perfusion: No wedge shaped peripheral perfusion defects to suggest  acute pulmonary embolism.  IMPRESSION:  Low probability of pulmonary embolus.  6/11 2D ECHO Study Conclusions - Left ventricle: The cavity size was normal. Systolic function was normal. The estimated ejection fraction was 55%. Images were inadequate for LV wall motion assessment. - Ventricular septum: Septal motion showed paradox. These changes are consistent with intraventricular conduction delay. - Pulmonic valve: There was trivial regurgitation.  6/11 Bilateral LE Duplex Summary: - No evidence of deep vein or superficial thrombosis involving the right lower extremity and left lower extremity. - No evidence of Baker&'s cyst on the right or left.  6/11 X-ray: R-Humerus IMPRESSION:  Again noted displaced healing fracture in distal shaft of right  humerus with healing response and callus formation. No new fracture  or subluxation.  6/11 X-ray R-shoulder IMPRESSION:  1. Degenerative changes right shoulder.  2. Angulated displaced pathologic fracture of the right  distal  humerus.  3. Focal lytic lesion in the right mid humeral diaphysis. This  consistent with metastatic disease and/or myeloma . Small focal  lytic rib lesions cannot be excluded.  Nobie Putnam, DO 02/04/2014, 12:47 PM PGY-1, [place] Intern pager: [phone], text pages welcome

## 2014-02-04 NOTE — Discharge Summary (Signed)
[facility] Discharge Summary  Patient name: Anita [name] Medical record number: [id number] Date of birth: 1945-09-18 Age: 68 y.o. Gender: female Date of Admission: 02/03/2014  Date of Discharge: 02/09/2014  Admitting Physician: Zigmund Gottron, MD  Primary Care Provider: [name], DO Consultants: none  Indication for Hospitalization: Dyspnea, bilateral lower ext edema  Discharge Diagnoses/Problem List:  Acute on chronic diastolic CHF exacerbation - Improved H/o R-arm pathologic humerus fracture, due to bone metastasis, s/p repeat fall AKI, in setting of CKD-III - Resolved Breast Cancer, Stage-4, metastatic Hypokalemia - Resolved Asymptomatic bacteria, colonization (Coag neg Staph) CAD COPD T2DM Leukopenia, chronic, secondary to chemo Chronic Pain H/o poor appetite  Disposition: Home, resume Home Health PT/OT, RN  Discharge Condition: Stable  Discharge Exam: General: resting, cooperative, NAD  Cardiovascular: distant heart sounds; RRR, no murmur  Respiratory: CTAB except mild bibasilar crackles  Abdomen: Soft, NTND, obese  MSK: Right arm in splint + ace wrap and in sling  Ext - Continued bilateral LE foot/ankle/calf edema (+2-3 pitting), minimal erythema and warmth, nontender  Skin: Warm, dry.  Neuro: Alert and oriented x3  Brief Hospital Course:  Anita [name] is a 68 y.o. female who presented with shortness of breath and bilateral leg swelling. PMH is significant for COPD, sCHF/dCHF, DM, previous NSTEMI s/p cath and stage 4 metastatic breast cancer. Recent hospitalization 01/10/14 to 01/13/14 for generalized weakness, R-humerus pathologic fracture (d/t bone mets). Patient had recently completed short term rehab at SNF with improvement, had been home < 1 week.  Presented to ED with suspected CHF exacerbation and volume overload, found with worsening dyspnea and bilateral LE edema, wt 224 lbs (inconsistent with recent suspected dry wt  230s), elevated ProBNP 613 (< 1 month ago 379), Trop (neg), bilateral LE duplex (neg DVT), VQ (low probability for PE), unchanged home O2 req, additionally with recent repeat fall concern for re-injure R-arm (prior pathologic humerus fracture), X-rays showed stable prior displaced fracture, Orthopedics reviewed and tech placed coaptation splint for support (outpatient follow-up). Admitted for aggressive symptomatic diuresis with Lasix IV, monitoring volume status and kidney function, in setting of multiple chronic co-morbidities.  During hospitalization, demonstrated continued improvement with aggressive diuresis, significant urine output almost 9L fluid with continued Lasix $RemoveBeforeD'80mg'JhbzboCytARxgm$  IV daily dosing, improved but some persistent symptoms especially with chronic edema, stable on home O2. Improved creatinine with diuresis appropriately. Transitioned to Torsemide $RemoveBefo'40mg'mVBQpJlkBxP$  PO daily to continue on discharge. Contacted Cardiology to arrange close outpatient follow-up on discharge. Patient declined return to SNF, PT/OT recommend Home Health, discharged to home for continued PT and RN monitoring. Plans to continue palliative radiation therapy with Oncology.  Issues for Follow Up:  1. CHF / Diuresis - Previously on Lasix $Remove'80mg'YdusfUn$  PO daily, questionable effectiveness. Discharged on trial Torsemide $RemoveBeforeD'40mg'GAFMQsFBLIqQtH$  daily, monitor electrolytes, kidney function, and diuresis. Wt on discharge 228 lbs.  2. Ortho f/u R-arm pathologic fracture, in splint  3. Oncology - palliative radiation therapies  4. Continue home health PT, may consider future alternatives for repeat short term SNF stay  Significant Procedures: none  Significant Labs and Imaging:   Recent Labs Lab 02/03/14 1452 02/04/14 0540 02/05/14 0610  WBC 2.5* 2.5* 2.5*  HGB 9.7* 9.6* 9.2*  HCT 29.8* 30.5* 29.0*  PLT 205 187 171    Recent Labs Lab 02/03/14 1452 02/04/14 0540 02/05/14 0610 02/06/14 0717  NA 144 140 141 137  K 3.9 3.4* 3.7 3.4*  CL 103 97 100 96   CO2 26 22 24  25  GLUCOSE 131* 133* 129* 133*  BUN 26* 28* 30* 32*  CREATININE 1.49* 1.63* 1.81* 1.54*  CALCIUM 9.5 9.1 9.0 9.2  ALKPHOS  --  153*  --   --   AST  --  18  --   --   ALT  --  16  --   --   ALBUMIN  --  3.5  --   --    POCT Trop - 0.00 ProBNP - 613 (recent 300, 01/10/14) Urine Culture (6/10 >> coag neg Staph >> contaminate)  Imaging/Diagnostic Tests:  6/10 Portable CXR 1v  IMPRESSION:  No acute cardiopulmonary disease.  Re-demonstration of multiple lucent bone lesions. Findings are  compatible with history of multiple myeloma. Difficult to exclude a  pathologic fracture involving the left seventh rib.  6/10 NM V/Q  FINDINGS:  Ventilation: No focal ventilation defect.  Perfusion: No wedge shaped peripheral perfusion defects to suggest  acute pulmonary embolism.  IMPRESSION:  Low probability of pulmonary embolus.  6/11 2D ECHO  Study Conclusions - Left ventricle: The cavity size was normal. Systolic function was normal. The estimated ejection fraction was 55%. Images were inadequate for LV wall motion assessment. - Ventricular septum: Septal motion showed paradox. These changes are consistent with intraventricular conduction delay. - Pulmonic valve: There was trivial regurgitation.  6/11 Bilateral LE Duplex  Summary: - No evidence of deep vein or superficial thrombosis involving the right lower extremity and left lower extremity. - No evidence of Baker&'s cyst on the right or left.  6/11 X-ray: R-Humerus  IMPRESSION:  Again noted displaced healing fracture in distal shaft of right  humerus with healing response and callus formation. No new fracture  or subluxation.  6/11 X-ray R-shoulder  IMPRESSION:  1. Degenerative changes right shoulder.  2. Angulated displaced pathologic fracture of the right distal  humerus.  3. Focal lytic lesion in the right mid humeral diaphysis. This  consistent with metastatic disease and/or myeloma . Small focal  lytic  rib lesions cannot be excluded.  Results/Tests Pending at Time of Discharge: none  Discharge Medications:    Medication List    STOP taking these medications       furosemide 80 MG tablet  Commonly known as:  LASIX      TAKE these medications       albuterol 108 (90 BASE) MCG/ACT inhaler  Commonly known as:  PROVENTIL HFA;VENTOLIN HFA  Inhale 1 puff into the lungs 2 (two) times daily as needed for wheezing or shortness of breath.     aspirin 81 MG EC tablet  Take 1 tablet (81 mg total) by mouth daily.     atorvastatin 40 MG tablet  Commonly known as:  LIPITOR  Take 1 tablet (40 mg total) by mouth daily at 6 PM.     HYDROcodone-acetaminophen 5-325 MG per tablet  Commonly known as:  NORCO/VICODIN  Take 2 tablets by mouth every 4 (four) hours as needed for moderate pain.     IBRANCE 125 MG capsule  Generic drug:  palbociclib  Take 125 mg by mouth daily with breakfast. Take whole with food.     isosorbide mononitrate 30 MG 24 hr tablet  Commonly known as:  IMDUR  Take 1 tablet (30 mg total) by mouth daily.     letrozole 2.5 MG tablet  Commonly known as:  FEMARA  Take 1 tablet (2.5 mg total) by mouth daily.     mirtazapine 15 MG tablet  Commonly known as:  REMERON  Take 1 tablet (15 mg total) by mouth at bedtime.     nitroGLYCERIN 0.4 MG SL tablet  Commonly known as:  NITROSTAT  Place 1 tablet (0.4 mg total) under the tongue every 5 (five) minutes as needed for chest pain.     NON FORMULARY  Place 3 L into the nose as needed (oxygen).     potassium chloride SA 20 MEQ tablet  Commonly known as:  K-DUR,KLOR-CON  Take 1-2 tablets (20-40 mEq total) by mouth 2 (two) times daily. 2 tablets (40 mEq) AM and 1 tablet ( 20 mEq) PM     torsemide 20 MG tablet  Commonly known as:  DEMADEX  Take 2 tablets (40 mg total) by mouth daily.     Umeclidinium-Vilanterol 62.5-25 MCG/INH Aepb  Inhale 1 puff into the lungs as needed (for COPD).     VITAMIN D (CHOLECALCIFEROL) PO   Take 1,000 mg by mouth daily.        Discharge Instructions: Please refer to Patient Instructions section of EMR for full details.  Patient was counseled important signs and symptoms that should prompt return to medical care, changes in medications, dietary instructions, activity restrictions, and follow up appointments.   Follow-Up Appointments: Follow-up Information   Follow up with Gwendolyn Fill, MD On 02/10/2014. (at 3:15pm for hospital follow-up)    Specialty:  Family Medicine   Contact information:   [address] [place] [address] [phone]       Follow up with Lyda Jester, PA-C On 02/18/2014. (See for Dr. Percival Spanish at 11:30 am)    Specialty:  Cardiology   Contact information:   [address] [place] [address] [phone]       Hawley Pavia, DO 02/09/2014, 6:33 PM PGY-1, [place]

## 2014-02-04 NOTE — Progress Notes (Signed)
VASCULAR LAB PRELIMINARY  PRELIMINARY  PRELIMINARY  PRELIMINARY  BIlateral lower extremity venous Dopplers completed.    Preliminary report:  There is no DVT or SVT noted in the bilateral lower extremities.  Georgian Mcclory, RVT 02/04/2014, 10:52 AM

## 2014-02-05 ENCOUNTER — Ambulatory Visit: Payer: Medicare HMO

## 2014-02-05 LAB — CBC
HEMATOCRIT: 29 % — AB (ref 36.0–46.0)
HEMOGLOBIN: 9.2 g/dL — AB (ref 12.0–15.0)
MCH: 24.9 pg — AB (ref 26.0–34.0)
MCHC: 31.7 g/dL (ref 30.0–36.0)
MCV: 78.4 fL (ref 78.0–100.0)
Platelets: 171 10*3/uL (ref 150–400)
RBC: 3.7 MIL/uL — ABNORMAL LOW (ref 3.87–5.11)
RDW: 18.6 % — ABNORMAL HIGH (ref 11.5–15.5)
WBC: 2.5 10*3/uL — ABNORMAL LOW (ref 4.0–10.5)

## 2014-02-05 LAB — GLUCOSE, CAPILLARY
GLUCOSE-CAPILLARY: 124 mg/dL — AB (ref 70–99)
GLUCOSE-CAPILLARY: 116 mg/dL — AB (ref 70–99)
GLUCOSE-CAPILLARY: 149 mg/dL — AB (ref 70–99)
Glucose-Capillary: 127 mg/dL — ABNORMAL HIGH (ref 70–99)

## 2014-02-05 LAB — BASIC METABOLIC PANEL
BUN: 30 mg/dL — AB (ref 6–23)
CHLORIDE: 100 meq/L (ref 96–112)
CO2: 24 mEq/L (ref 19–32)
Calcium: 9 mg/dL (ref 8.4–10.5)
Creatinine, Ser: 1.81 mg/dL — ABNORMAL HIGH (ref 0.50–1.10)
GFR calc Af Amer: 32 mL/min — ABNORMAL LOW (ref >90)
GFR, EST NON AFRICAN AMERICAN: 28 mL/min — AB (ref >90)
Glucose, Bld: 129 mg/dL — ABNORMAL HIGH (ref 70–99)
POTASSIUM: 3.7 meq/L (ref 3.7–5.3)
Sodium: 141 mEq/L (ref 137–147)

## 2014-02-05 MED ORDER — FUROSEMIDE 80 MG PO TABS
80.0000 mg | ORAL_TABLET | Freq: Every day | ORAL | Status: DC
  Filled 2014-02-05: qty 1

## 2014-02-05 NOTE — Progress Notes (Signed)
Seen and examined.  Discussed with Dr. Raliegh Ip.  Agree with his documentation and management.  We are approaching dispo with Anita [name].  I fear she will not thrive at home.  We also need to carefully watch creat as it has increased some with our diuresis.

## 2014-02-05 NOTE — Progress Notes (Signed)
Family Medicine Teaching Service Daily Progress Note Intern Pager: [phone]  Patient name: Anita [name] Medical record number: [id number] Date of birth: 11-Jun-1946 Age: 68 y.o. Gender: female  Primary Care Provider: [name], DO Consultants: none Code Status: Full (confirmed on admission)  Pt Overview and Major Events to Date:   Assessment and Plan: Anita [name] is a 68 y.o. female presenting with bilateral leg swelling . PMH is significant for COPD, sCHF/dCHF, DM, previous NSTEMI s/p cath and stage 4 breast cancer. Recent hospitalization 01/10/14 to 01/13/14 for generalized weakness, R-humerus fracture, stable volume status w/o acute CHF exac.  # Dyspnea, suspected acute exac on chronic diastolic CHF - Suspected acute dCHF with peripheral edema, SOB. Last ECHO 02/2013 (EF 30-86%, Grade 1 diastolic dysfxn), elevated ProBNP 613 (prior 379, 01/10/14), POCT Trop (neg). Worked up for DVT / PT with concern for hypercoagulable state with h/o CA, Well's 4, (V/Q scan in ED with low probability for PE). H/o dry weight 235-238. - Remain on telemetry, titrate O2 - strict I/Os, daily wts (224>>227 lbs, need to determine new dry wt) - repeat 2D ECHO (EF 55%, paradoxical septal motion, intraventricular conduction delay) - b/l LE dopplers (negative, no DVT) - Continue Lasix $RemoveBefore'80mg'yfkPBBAHqoQyA$  IV daily, good UOP (6 L out total in 2 days) - Transition to Lasix $Remove'80mg'UzibkaE$  PO tomorrow - TED hose, elevate legs, sodium restrict - Hold Losartan $RemoveBefor'25mg'DlPIJzXQgTgw$  daily (previously held ARB on recent discharge due to AKI), note no benefit in setting of diastolic CHF - Notified Cardiology (requested to schedule close follow-up on discharge within 1-2 weeks, previously scheduled apt 03/12/14 for 6 month check-up)  # H/o R-arm humerus fracture, s/p repeat fall - Recent hospitalization 12/2013 for generalized weakness, and pathologic fracture R-humerus, no surgical intervention. Followed by Dr. Berenice Primas ([facility]) - X-rays R-humerus /  R-shoulder: no acute or repeat fracture / sublux, known pathologic fractured humerus - Called Ortho for review of X-rays and recommendations - Ordered Coaptation / sugar tong splint + ace wrap for support  # Hypokalemia - improved K 3.4 >> 3.7 (s/p PO 4 mEq x 2 doses) - f/u BMET  # AKI in setting of CKD-III - baseline creatinine of around 1.3. Creatinine 1.49 on admission. - Increasing Cr 1.49 >> 1.63 >> 1.81 - Hold nephrotoxic meds (ARB)  # Stage 4 Breast cancer, metastatic - currently undergoes palliative radiation. Has a pathologic fracture of right humerus. Per chart review, patient also receives Zometa outpatient. - Previously scheduled for radiation therapy 6/11 and 6/12, Dr. Sondra Come (canceled and notified) - Continue Ibrance and [facility]  # Asymptomatic bacteria: - UA in ED significant for leukocyte esterase and bacteria. Patient is asymptomatic. Significant recent history E.Coli UTI in 11/2013, also recent SNF stay for rehab. Suspect colonization. - s/p CTX x 1 dose in ED - Keflex PO $RemoveB'500mg'oBUikzZQ$  QID - f/u Urine culture (re-incubated for better growth)  # CAD: - Cath November 2014 LAD 40% stenosis, first diagonal 80% stenosis, circumflex 20% stenosis, right coronary artery occluded. The EF was 35-40% at that time. Losartan discontinued upon discharge of last admission. - Continue home ASA, imdur  # COPD: - on 2L [place] at night and for ambulation although patient uses 3L at night and 4L while ambulating for comfort. Uses Umeclidinium-Vilanterol PRN. Has scattered wheezes on exam - Albuterol PRN  # Diabetes mellitus, type 2 - metformin discontinued per discharge summary of last admission - Currently managed with diet - SSI  # Leukopenia - WBCs of 2.5 on admission.  Patient currently afebrile. Upon chart review, possibly related to patient starting Ibrance (palbociclib). Patient has microcytic anemia which is chronic. All cell lines not low, so concern for aplastic anemia lower - WBC  stable 2.5 >> 2.5 - f/u fever curve  # Chronic leg pain: - continue home Norco 1-2 q4hrs PRN for pain  # Hx of poor appetite: - Continue Remeron $RemoveBeforeDE'15mg'UZAnqdAuijdfLnV$  qhs  FEN/GI: SLIV, heart healthy/carb modified  Prophylaxis: Heparin subq  Disposition: Admit to telemetry, management of acute CHF exac with diuresis, further work-up, PT/OT eval, pending clinical improvement. - PT/OT - eval and treat (recent SNF discharge, pt does not wish to return) - CSW (coordination of home health and potential placement)  Subjective: Feeling about the same today. Feels fatigued today. Mildly improved SOB. Denies CP, fever, or cough. Improved LE discomfort and swelling. Good UOP voiding well.  Objective: Temp:  [98.2 F (36.8 C)-98.9 F (37.2 C)] 98.2 F (36.8 C) (06/12 0626) Pulse Rate:  [87-101] 87 (06/12 0626) Resp:  [19-20] 19 (06/12 0626) BP: (107-138)/(54-62) 138/62 mmHg (06/12 0626) SpO2:  [95 %-98 %] 98 % (06/12 0626) Weight:  [227 lb 4.7 oz (103.1 kg)] 227 lb 4.7 oz (103.1 kg) (06/12 4481) Physical Exam: General: sitting up at bedside, cooperative, NAD Cardiovascular: distant heart sounds; RRR, no murmur Respiratory: Improved breath sounds bilaterally, mild bibasilar crackles Abdomen: Soft, NTND, +active BS  Extremities: Right arm in splint + ace wrap and in sling. Slightly improved bilateral LE foot/ankle/calf tenderness with significant edema and warmth; 2-3+ pitting edema to at least the knees bilaterally Skin: Warm, dry. Erythema left LE > R Neuro: Alert and oriented x3  Laboratory:  Recent Labs Lab 02/03/14 1452 02/04/14 0540 02/05/14 0610  WBC 2.5* 2.5* 2.5*  HGB 9.7* 9.6* 9.2*  HCT 29.8* 30.5* 29.0*  PLT 205 187 171    Recent Labs Lab 02/02/14 1438 02/03/14 1452 02/04/14 0540 02/05/14 0610  NA 142 144 140 141  K 4.1 3.9 3.4* 3.7  CL  --  103 97 100  CO2 $Re'26 26 22 24  'sFA$ BUN 24.2 26* 28* 30*  CREATININE 1.5* 1.49* 1.63* 1.81*  CALCIUM 9.0 9.5 9.1 9.0  PROT 6.5  --  7.1  --    BILITOT 0.38  --  0.4  --   ALKPHOS 158*  --  153*  --   ALT 19  --  16  --   AST 18  --  18  --   GLUCOSE 118 131* 133* 129*   POCT Trop - 0.00 ProBNP - 613 (recent 300, 01/10/14)  Imaging/Diagnostic Tests:  6/10 Portable CXR 1v IMPRESSION:  No acute cardiopulmonary disease.  Re-demonstration of multiple lucent bone lesions. Findings are  compatible with history of multiple myeloma. Difficult to exclude a  pathologic fracture involving the left seventh rib.  6/10 NM V/Q FINDINGS:  Ventilation: No focal ventilation defect.  Perfusion: No wedge shaped peripheral perfusion defects to suggest  acute pulmonary embolism.  IMPRESSION:  Low probability of pulmonary embolus.  6/11 2D ECHO Study Conclusions - Left ventricle: The cavity size was normal. Systolic function was normal. The estimated ejection fraction was 55%. Images were inadequate for LV wall motion assessment. - Ventricular septum: Septal motion showed paradox. These changes are consistent with intraventricular conduction delay. - Pulmonic valve: There was trivial regurgitation.  6/11 Bilateral LE Duplex Summary: - No evidence of deep vein or superficial thrombosis involving the right lower extremity and left lower extremity. - No evidence of Baker&'s cyst  on the right or left.  6/11 X-ray: R-Humerus IMPRESSION:  Again noted displaced healing fracture in distal shaft of right  humerus with healing response and callus formation. No new fracture  or subluxation.  6/11 X-ray R-shoulder IMPRESSION:  1. Degenerative changes right shoulder.  2. Angulated displaced pathologic fracture of the right distal  humerus.  3. Focal lytic lesion in the right mid humeral diaphysis. This  consistent with metastatic disease and/or myeloma . Small focal  lytic rib lesions cannot be excluded.  Nobie Putnam, DO 02/05/2014, 12:30 PM PGY-1, [place] Intern pager: [phone], text pages  welcome

## 2014-02-05 NOTE — Evaluation (Signed)
Occupational Therapy Evaluation Patient Details Name: Anita [name] MRN: [id number] DOB: 1946-04-16 Today's Date: 02/05/2014    History of Present Illness  68 y.o. female presenting with bilateral leg swelling . PMH is significant for COPD, CHF, DM, previous NSTEMI s/p cath and stage 4 breast cancer. Recent hospitalization 01/10/14 to 01/13/14 for generalized weakness, R-humerus fracture, stable volume status w/o acute CHF exac. Pt d/c home from [facility] 02/03/14.    Clinical Impression   Patient evaluated by Occupational Therapy with no further acute OT needs identified. All education has been completed and the patient has no further questions. See below for any follow-up Occupational Therapy or equipment needs. OT to sign off. Thank you for referral.   Pt demonstrates ability to transfer to bathroom, perform peri care, wash hands at sink and poor use of SPC. Pt is at adequate level for d/c home. Pt educated on edema management for RT UE and BIL LE. Pt positioned at end of session with BIL LE elevated.    Follow Up Recommendations  Home health OT    Equipment Recommendations  None recommended by OT    Recommendations for Other Services       Precautions / Restrictions Precautions Precautions: Fall Precaution Comments: Rt distal humerus fx in sling Required Braces or Orthoses: Sling Restrictions Weight Bearing Restrictions: Yes RUE Weight Bearing: Non weight bearing      Mobility Bed Mobility               General bed mobility comments: in chair on arrival  Transfers Overall transfer level: Needs assistance Equipment used: Straight cane Transfers: Sit to/from Stand Sit to Stand: Min guard         General transfer comment: pt pushing up with Lt UE    Balance                                            ADL Overall ADL's : Needs assistance/impaired     Grooming: Modified independent   Upper Body Bathing: Moderate assistance                Toilet Transfer: Min guard   Toileting- Clothing Manipulation and Hygiene: Min guard       Functional mobility during ADLs: Min guard General ADL Comments: Pt ambulating carrying cane and not using it properly with LT UE. pt reports "the other therapist told me that adn I walk to fast" pt recall education from previous session however with poor return demo.      Vision                     Perception     Praxis      Pertinent Vitals/Pain vss     Hand Dominance Right   Extremity/Trunk Assessment Upper Extremity Assessment Upper Extremity Assessment: RUE deficits/detail RUE Deficits / Details: sugar tong cast from MCP to upper arm. no edema noted. pt required Max (A) to adjust sling. RUE Coordination: decreased fine motor;decreased gross motor   Lower Extremity Assessment Lower Extremity Assessment: Defer to PT evaluation;RLE deficits/detail;LLE deficits/detail (pitting edema) RLE Deficits / Details: pitting edema +2 LLE Deficits / Details: Pitting edema +3   Cervical / Trunk Assessment Cervical / Trunk Assessment: Normal   Communication Communication Communication: No difficulties   Cognition Arousal/Alertness: Awake/alert Behavior During Therapy: WFL for tasks assessed/performed Overall Cognitive Status: Within Functional  Limits for tasks assessed                     General Comments       Exercises       Shoulder Instructions      Home Living Family/patient expects to be discharged to:: Private residence Living Arrangements: Spouse/significant other                 Bathroom Shower/Tub: Other (comment) (sponge bath)   Bathroom Toilet: Standard     Home Equipment: Cane - single point;Bedside commode          Prior Functioning/Environment Level of Independence: Needs assistance    ADL's / Homemaking Assistance Needed: Husband performs all homemaking and pt has a Marine scientist and daughter that comes to A with ADLs.  (says they do  100% of bathing, she can get her underwear on but not pulled up and she can dress her UB in a gown leaving RUE under the gown)        OT Diagnosis:     OT Problem List:     OT Treatment/Interventions:      OT Goals(Current goals can be found in the care plan section)    OT Frequency:     Barriers to D/C:            Co-evaluation              End of Session Nurse Communication: Mobility status  Activity Tolerance: Patient tolerated treatment well Patient left: in chair;with call bell/phone within reach   Time: 1159-1211 OT Time Calculation (min): 12 min Charges:  OT General Charges $OT Visit: 1 Procedure OT Evaluation $Initial OT Evaluation Tier I: 1 Procedure OT Treatments $Self Care/Home Management : 8-22 mins G-Codes:    Parke Poisson B Feb 08, 2014, 1:35 PM Pager: [phone]

## 2014-02-05 NOTE — Evaluation (Signed)
Physical Therapy Evaluation Patient Details Name: Anita [name] MRN: [id number] DOB: 23-Oct-1945 Today's Date: 02/05/2014   History of Present Illness   68 y.o. female presenting with bilateral leg swelling . PMH is significant for COPD, CHF, DM, previous NSTEMI s/p cath and stage 4 breast cancer. Recent hospitalization 01/10/14 to 01/13/14 for generalized weakness, R-humerus fracture, stable volume status w/o acute CHF exac. Pt d/c home from [facility] 02/03/14.   Clinical Impression  Pt admitted with above. Pt currently with functional limitations due to the deficits listed below (see PT Problem List).  Pt will benefit from skilled PT to increase their independence and safety with mobility to allow discharge to the venue listed below.       Follow Up Recommendations Home health PT;Supervision/Assistance - 24 hour    Equipment Recommendations  None recommended by PT    Recommendations for Other Services       Precautions / Restrictions Precautions Precautions: Fall Precaution Comments: Rt distal humerus fx in sling Required Braces or Orthoses: Sling Restrictions Weight Bearing Restrictions: Yes RUE Weight Bearing: Non weight bearing      Mobility  Bed Mobility Overal bed mobility: Needs Assistance Bed Mobility: Sit to Supine       Sit to supine: Supervision   General bed mobility comments: Supervision to ensure NWB RUE while getting into bed  Transfers Overall transfer level: Needs assistance Equipment used: Straight cane Transfers: Sit to/from Stand Sit to Stand: Min guard         General transfer comment: pt pushing up with Lt UE  Ambulation/Gait Ambulation/Gait assistance: Min guard Ambulation Distance (Feet): 50 Feet Assistive device: Straight cane Gait Pattern/deviations: Decreased stride length;Wide base of support     General Gait Details: Overall moving well; Noted incr fatigue requiring return to room; Cues for cane use as pt tended to hold it up off of the  floor, instead of using it for support  Stairs            Wheelchair Mobility    Modified Rankin (Stroke Patients Only)       Balance                                             Pertinent Vitals/Pain no apparent distress elevated RUE for edema and pain control     Home Living Family/patient expects to be discharged to:: Private residence Living Arrangements: Spouse/significant other Available Help at Discharge: Family;Available PRN/intermittently Type of Home: House Home Access: Stairs to enter Entrance Stairs-Rails:  (unknown) Entrance Stairs-Number of Steps: 4 Home Layout: One level Home Equipment: Cane - single point;Bedside commode      Prior Function Level of Independence: Needs assistance      ADL's / Homemaking Assistance Needed: Husband performs all homemaking and pt has a Marine scientist and daughter that comes to A with ADLs.  (says they do 100% of bathing, she can get her underwear on but not pulled up and she can dress her UB in a gown leaving RUE under the gown); This per OT         Hand Dominance   Dominant Hand: Right    Extremity/Trunk Assessment   Upper Extremity Assessment: Defer to OT evaluation RUE Deficits / Details: sugar tong cast from MCP to upper arm. no edema noted. pt required Max (A) to adjust sling.  Lower Extremity Assessment: Overall WFL for tasks assessed RLE Deficits / Details: pitting edema +2 LLE Deficits / Details: Pitting edema +3  Cervical / Trunk Assessment: Normal  Communication   Communication: No difficulties  Cognition Arousal/Alertness: Awake/alert Behavior During Therapy: WFL for tasks assessed/performed Overall Cognitive Status: Within Functional Limits for tasks assessed                      General Comments      Exercises        Assessment/Plan    PT Assessment Patient needs continued PT services  PT Diagnosis Difficulty walking;Generalized weakness   PT Problem  List Decreased strength;Decreased activity tolerance;Decreased balance;Decreased mobility;Decreased knowledge of use of DME;Pain  PT Treatment Interventions DME instruction;Gait training;Functional mobility training;Stair training;Therapeutic activities;Therapeutic exercise;Balance training;Patient/family education   PT Goals (Current goals can be found in the Care Plan section) Acute Rehab PT Goals Patient Stated Goal: wants to get home PT Goal Formulation: With patient Time For Goal Achievement: 02/19/14 Potential to Achieve Goals: Good    Frequency Min 3X/week   Barriers to discharge        Co-evaluation               End of Session Equipment Utilized During Treatment: Gait belt Activity Tolerance: Patient tolerated treatment well;Patient limited by fatigue Patient left: in bed;with call bell/phone within reach Nurse Communication: Mobility status         Time: 7494-4967 PT Time Calculation (min): 9 min   Charges:   PT Evaluation $Initial PT Evaluation Tier I: 1 Procedure     PT G CodesQuin Hoop 02/05/2014, 4:38 PM Roney Marion, [place] Pager [phone] Office [phone]

## 2014-02-05 NOTE — Progress Notes (Signed)
Clinical Social Work Department BRIEF PSYCHOSOCIAL ASSESSMENT 02/05/2014  Patient:  Anita [name], Anita [name]     Account Number:  [id number]     Admit date:  02/03/2014  Clinical Social Worker:  Freeman Caldron  Date/Time:  02/05/2014 02:39 PM  Referred by:  Physician  Date Referred:  02/05/2014 Referred for  SNF Placement   Other Referral:   Interview type:  Patient Other interview type:    PSYCHOSOCIAL DATA Living Status:  FAMILY Admitted from facility:   Level of care:   Primary support name:  Charlii Yost [phone]) Primary support relationship to patient:  SPOUSE Degree of support available:   Good--pt has support from spouse.    CURRENT CONCERNS Current Concerns  Post-Acute Placement   Other Concerns:    SOCIAL WORK ASSESSMENT / PLAN Pt sleeping when CSW went to room to complete assessment. Pt woke to verbal prompts. CSW explained role in discharge and asked pt to verify her plan. Pt states she will be going home with home health, which is what is reocmmended by PT/OT. CSW signing off.   Assessment/plan status:  No Further Intervention Required Other assessment/ plan:   Information/referral to community resources:   Home health    PATIENT'S/FAMILY'S RESPONSE TO PLAN OF CARE: Good--pt understanding of CSW role and thanked CSW. CSW signing off.       Ky Barban, MSW, [place] Clinical Social Worker

## 2014-02-06 LAB — GLUCOSE, CAPILLARY
GLUCOSE-CAPILLARY: 139 mg/dL — AB (ref 70–99)
Glucose-Capillary: 134 mg/dL — ABNORMAL HIGH (ref 70–99)
Glucose-Capillary: 130 mg/dL — ABNORMAL HIGH (ref 70–99)
Glucose-Capillary: 134 mg/dL — ABNORMAL HIGH (ref 70–99)

## 2014-02-06 LAB — URINE CULTURE

## 2014-02-06 LAB — BASIC METABOLIC PANEL
BUN: 32 mg/dL — ABNORMAL HIGH (ref 6–23)
CO2: 25 meq/L (ref 19–32)
CREATININE: 1.54 mg/dL — AB (ref 0.50–1.10)
Calcium: 9.2 mg/dL (ref 8.4–10.5)
Chloride: 96 mEq/L (ref 96–112)
GFR calc Af Amer: 39 mL/min — ABNORMAL LOW (ref >90)
GFR calc non Af Amer: 34 mL/min — ABNORMAL LOW (ref >90)
GLUCOSE: 133 mg/dL — AB (ref 70–99)
Potassium: 3.4 mEq/L — ABNORMAL LOW (ref 3.7–5.3)
Sodium: 137 mEq/L (ref 137–147)

## 2014-02-06 MED ORDER — POTASSIUM CHLORIDE CRYS ER 20 MEQ PO TBCR
20.0000 meq | EXTENDED_RELEASE_TABLET | Freq: Two times a day (BID) | ORAL | Status: DC
  Administered 2014-02-06 – 2014-02-07 (×2): 20 meq via ORAL
  Filled 2014-02-06 (×2): qty 1

## 2014-02-06 MED ORDER — TORSEMIDE 20 MG PO TABS
40.0000 mg | ORAL_TABLET | Freq: Every day | ORAL | Status: DC
  Administered 2014-02-07: 40 mg via ORAL
  Filled 2014-02-06 (×2): qty 2

## 2014-02-06 MED ORDER — POTASSIUM CHLORIDE CRYS ER 20 MEQ PO TBCR
40.0000 meq | EXTENDED_RELEASE_TABLET | Freq: Once | ORAL | Status: AC
  Administered 2014-02-06: 40 meq via ORAL
  Filled 2014-02-06: qty 2

## 2014-02-06 MED ORDER — SENNOSIDES-DOCUSATE SODIUM 8.6-50 MG PO TABS
1.0000 | ORAL_TABLET | Freq: Two times a day (BID) | ORAL | Status: DC
  Administered 2014-02-06 – 2014-02-07 (×3): 1 via ORAL
  Filled 2014-02-06 (×5): qty 1

## 2014-02-06 MED ORDER — FUROSEMIDE 10 MG/ML IJ SOLN
80.0000 mg | Freq: Every day | INTRAMUSCULAR | Status: DC
  Administered 2014-02-06: 80 mg via INTRAVENOUS
  Filled 2014-02-06: qty 8

## 2014-02-06 NOTE — ED Provider Notes (Signed)
Medical screening examination/treatment/procedure(s) were conducted as a shared visit with non-physician practitioner(s) and myself.  I personally evaluated the patient during the encounter.   EKG Interpretation   Date/Time:  Wednesday February 03 2014 14:45:27 EDT Ventricular Rate:  80 PR Interval:  171 QRS Duration: 123 QT Interval:  397 QTC Calculation: 458 R Axis:   89 Text Interpretation:  Sinus rhythm Nonspecific intraventricular conduction  delay ED PHYSICIAN INTERPRETATION AVAILABLE IN [facility] Confirmed  by TEST, Record ([id number]) on 02/05/2014 7:20:26 AM       Patient with leg swelling and dyspnea the feels like her prior CHF. However her lungs do not appear like significant CHF but she is hypoxic off her baseline. No evidence of pulmonary embolus on VQ scan. Will need admission to family medicine.  Ephraim Hamburger, MD 02/06/14 [phone]

## 2014-02-06 NOTE — Plan of Care (Signed)
Problem: Phase I Progression Outcomes Goal: EF % per last Echo/documented,Core Reminder form on chart Echo 50-60%. No shortness of breath noted.

## 2014-02-06 NOTE — Progress Notes (Signed)
Seen and examined.  Discussed with Dr. Raliegh Ip.  Agree with his documentation and management.  Still with mild volume overload.  Creat is back down.  Continue inpatient diuresis another 24 h.

## 2014-02-06 NOTE — Progress Notes (Signed)
Physical Therapy Treatment Patient Details Name: Anita [name] MRN: [id number] DOB: Nov 20, 1945 Today's Date: 02/22/2014    History of Present Illness  68 y.o. female presenting with bilateral leg swelling . PMH is significant for COPD, CHF, DM, previous NSTEMI s/p cath and stage 4 breast cancer. Recent hospitalization 01/10/14 to 01/13/14 for generalized weakness, R-humerus fracture, stable volume status w/o acute CHF exac. Pt d/c home from [facility] 02/03/14.     PT Comments    Patient did well with ambulation with no assistive device.  Progressing with mobility.  Follow Up Recommendations  Home health PT;Supervision/Assistance - 24 hour     Equipment Recommendations  None recommended by PT    Recommendations for Other Services       Precautions / Restrictions Precautions Precautions: Fall Precaution Comments: Rt distal humerus fx in sling Required Braces or Orthoses: Sling (RUE) Restrictions Weight Bearing Restrictions: Yes RUE Weight Bearing: Non weight bearing    Mobility  Bed Mobility                  Transfers Overall transfer level: Needs assistance Equipment used: None Transfers: Sit to/from Stand Sit to Stand: Supervision         General transfer comment: Supervision for safety only  Ambulation/Gait Ambulation/Gait assistance: Min guard Ambulation Distance (Feet): 180 Feet Assistive device: None Gait Pattern/deviations: Step-through pattern;Decreased step length - right;Decreased step length - left;Wide base of support (Lateral trunk shift to each side with short steps)   Gait velocity interpretation: at or above normal speed for age/gender General Gait Details: Patient with fairly good balance during gait with no AD.  Fatigued at end of session.   Stairs            Wheelchair Mobility    Modified Rankin (Stroke Patients Only)       Balance                                    Cognition Arousal/Alertness:  Awake/alert Behavior During Therapy: WFL for tasks assessed/performed Overall Cognitive Status: Within Functional Limits for tasks assessed                      Exercises      General Comments        Pertinent Vitals/Pain     Home Living                      Prior Function            PT Goals (current goals can now be found in the care plan section) Progress towards PT goals: Progressing toward goals    Frequency  Min 3X/week    PT Plan Current plan remains appropriate    Co-evaluation             End of Session Equipment Utilized During Treatment: Gait belt Activity Tolerance: Patient tolerated treatment well;Patient limited by fatigue Patient left: in chair;with call bell/phone within reach     Time: 1025-1039 PT Time Calculation (min): 14 min  Charges:  $Gait Training: 8-22 mins                    G Codes:      Despina Pole 2014-02-22, 11:49 AM Carita Pian. Sanjuana Kava, [place] Pager [phone]

## 2014-02-06 NOTE — Progress Notes (Signed)
Family Medicine Teaching Service Daily Progress Note Intern Pager: [phone]  Patient name: Anita [name] Medical record number: [id number] Date of birth: 09-07-1945 Age: 68 y.o. Gender: female  Primary Care Provider: [name], DO Consultants: none Code Status: Full (confirmed on admission)  Pt Overview and Major Events to Date:   Assessment and Plan: Anita [name] is a 68 y.o. female presenting with bilateral leg swelling . PMH is significant for COPD, sCHF/dCHF, DM, previous NSTEMI s/p cath and stage 4 breast cancer. Recent hospitalization 01/10/14 to 01/13/14 for generalized weakness, R-humerus fracture, stable volume status w/o acute CHF exac.  # Dyspnea, suspected acute exac on chronic diastolic CHF - Suspected acute dCHF with peripheral edema, SOB. Last ECHO 02/2013 (EF 21-19%, Grade 1 diastolic dysfxn), elevated ProBNP 613 (prior 379, 01/10/14), POCT Trop (neg). Worked up for DVT / PT with concern for hypercoagulable state with h/o CA, Well's 4, (V/Q scan in ED with low probability for PE). H/o dry weight 235-238. - repeat 2D ECHO (EF 55%, paradoxical septal motion, intraventricular conduction delay). Note intraventricular pathology suspected to be contributing to worsening CHF with non-synchronized ventricular contraction, however pt poor candidate for placement of pacemaker. B/l Dopplers (neg DVT) - Remain on telemetry, titrate O2 - strict I/Os, daily wts (224>>227 >> 227 lbs, need to determine new dry wt) - Continue Lasix 71m IV daily (2.8 L UOP in 24 hours, previously total 6 L output) - Transition to PO Torsemide 471mdaily tomorrow (continue on DC, switch from Lasix 80 PO) - Notified Cardiology (requested to schedule close follow-up on discharge within 1-2 weeks, previously scheduled apt 03/12/14 for 6 month check-up) - PT/OT recommend HH, ordered resumption of care for home health services (PT, RN)  # H/o R-arm humerus fracture, s/p repeat fall - Recent hospitalization 12/2013 for  generalized weakness, and pathologic fracture R-humerus, no surgical intervention. [facility] - X-rays R-humerus / R-shoulder: no acute or repeat fracture / sublux, known pathologic fractured humerus - Called Ortho for review of X-rays and recommendations - Coaptation / sugar tong splint + ace wrap for support  # Hypokalemia - 3.4 >> 3.7 >> 3.4 - replace K 40 mEq PO today, then resume 20 mEq PO BID  - f/u BMET  # AKI in setting of CKD-III - Improving - baseline creatinine of around 1.3. Creatinine 1.49 on admission. - Improving Cr 1.49 >> 1.63 >> 1.81 >> 1.54 - Hold nephrotoxic meds (ARB), plan to resume ARB on discharge  # Stage 4 Breast cancer, metastatic - currently undergoes palliative radiation. Has a pathologic fracture of right humerus. Per chart review, patient also receives Zometa outpatient. - Previously scheduled for radiation therapy 6/11 and 6/12, Dr. KiSondra Comecanceled and notified) - [facility] Asymptomatic bacteria. colonization: - UA in ED significant for leukocyte esterase and bacteria. Patient is asymptomatic. Significant recent history E.Coli UTI in 11/2013, also recent SNF stay for rehab. Suspect colonization. - s/p CTX x 1 dose in ED - Discontinued Keflex PO 50073mID - Urine culture - (coag neg Staph) - micro considered colonization  # CAD: - Cath November 2014 LAD 40% stenosis, first diagonal 80% stenosis, circumflex 20% stenosis, right coronary artery occluded. The EF was 35-40% at that time. Losartan discontinued upon discharge of last admission. - Continue home ASA, imdur  # COPD: - on 2L [place] at night and for ambulation although patient uses 3L at night and 4L while ambulating for comfort. Uses Umeclidinium-Vilanterol PRN. Has  scattered wheezes on exam - Ordered 3L O2 o/n - Albuterol PRN  # Diabetes mellitus, type 2 - metformin discontinued per discharge summary of last admission - Currently managed with  diet - SSI  # Leukopenia - WBCs of 2.5 on admission. Patient currently afebrile. Upon chart review, possibly related to patient starting Ibrance (palbociclib). Patient has microcytic anemia which is chronic. All cell lines not low, so concern for aplastic anemia lower - WBC stable 2.5 >> 2.5 - f/u fever curve  # Chronic leg pain: - continue home Norco 1-2 q4hrs PRN for pain  # Hx of poor appetite: - Continue Remeron 65m qhs  FEN/GI: SLIV, heart healthy/carb modified  Prophylaxis: Heparin subq  Disposition: Admit to telemetry, management of acute CHF exac with diuresis, further work-up, [facility], pending clinical improvement expect to discharge to home tomorrow 6/14. - PT/OT - home health, ordered PT / RN - CSW (coordination of home health and potential placement)  Subjective: Reports improved SOB, fatigue, and LE edema today. Had home 3L O2 placed overnight. Continued good UOP. Concerned about potential discharge today, still with SOB and especially since family will not be ready for her to return home today, anticipated to go home tomorrow. Tolerating diet, ambulation with PT. Denies CP.  Objective: Temp:  [97.2 F (36.2 C)-98.1 F (36.7 C)] 97.2 F (36.2 C) (06/13 0500) Pulse Rate:  [73-88] 73 (06/13 0500) Resp:  [18-20] 18 (06/13 0500) BP: (115-125)/(57-72) 123/72 mmHg (06/13 0500) SpO2:  [98 %] 98 % (06/13 0500) Weight:  [227 lb 6.4 oz (103.148 kg)] 227 lb 6.4 oz (103.148 kg) (06/13 0500) Physical Exam: General: resting, cooperative, NAD Cardiovascular: distant heart sounds; RRR, no murmur Respiratory: Continued improved breath sounds bilaterally with improved air movement, mild bibasilar crackles Abdomen: Soft, NTND, +active BS  MSK: Right arm in splint + ace wrap and in sling Ext - Continued improved bilateral LE foot/ankle/calf edema (+2 pitting), reduced erythema and warmth Skin: Warm, dry. Neuro: Alert and oriented x3  Laboratory:  Recent  Labs Lab 02/03/14 1452 02/04/14 0540 02/05/14 0610  WBC 2.5* 2.5* 2.5*  HGB 9.7* 9.6* 9.2*  HCT 29.8* 30.5* 29.0*  PLT 205 187 171    Recent Labs Lab 02/02/14 1438  02/04/14 0540 02/05/14 0610 02/06/14 0717  NA 142  < > 140 141 137  K 4.1  < > 3.4* 3.7 3.4*  CL  --   < > 97 100 96  CO2 26  < > _0 BUN 24.2  < > 28* 30* 32*  CREATININE 1.5*  < > 1.63* 1.81* 1.54*  CALCIUM 9.0  < > 9.1 9.0 9.2  PROT 6.5  --  7.1  --   --   BILITOT 0.38  --  0.4  --   --   ALKPHOS 158*  --  153*  --   --   ALT 19  --  16  --   --   AST 18  --  18  --   --   GLUCOSE 118  < > 133* 129* 133*  < > = values in this interval not displayed. POCT Trop - 0.00 ProBNP - 613 (recent 300, 01/10/14)  Imaging/Diagnostic Tests:  6/10 Portable CXR 1v IMPRESSION:  No acute cardiopulmonary disease.  Re-demonstration of multiple lucent bone lesions. Findings are  compatible with history of multiple myeloma. Difficult to exclude a  pathologic fracture involving the left seventh rib.  6/10 NM V/Q FINDINGS:  Ventilation:  No focal ventilation defect.  Perfusion: No wedge shaped peripheral perfusion defects to suggest  acute pulmonary embolism.  IMPRESSION:  Low probability of pulmonary embolus.  6/11 2D ECHO Study Conclusions - Left ventricle: The cavity size was normal. Systolic function was normal. The estimated ejection fraction was 55%. Images were inadequate for LV wall motion assessment. - Ventricular septum: Septal motion showed paradox. These changes are consistent with intraventricular conduction delay. - Pulmonic valve: There was trivial regurgitation.  6/11 Bilateral LE Duplex Summary: - No evidence of deep vein or superficial thrombosis involving the right lower extremity and left lower extremity. - No evidence of Baker&'s cyst on the right or left.  6/11 X-ray: R-Humerus IMPRESSION:  Again noted displaced healing fracture in distal shaft of right  humerus with healing  response and callus formation. No new fracture  or subluxation.  6/11 X-ray R-shoulder IMPRESSION:  1. Degenerative changes right shoulder.  2. Angulated displaced pathologic fracture of the right distal  humerus.  3. Focal lytic lesion in the right mid humeral diaphysis. This  consistent with metastatic disease and/or myeloma . Small focal  lytic rib lesions cannot be excluded.  Nobie Putnam, DO 02/06/2014, 10:12 AM PGY-1, [place] Intern pager: [phone], text pages welcome

## 2014-02-07 DIAGNOSIS — S42309A Unspecified fracture of shaft of humerus, unspecified arm, initial encounter for closed fracture: Secondary | ICD-10-CM

## 2014-02-07 DIAGNOSIS — J9610 Chronic respiratory failure, unspecified whether with hypoxia or hypercapnia: Secondary | ICD-10-CM

## 2014-02-07 LAB — GLUCOSE, CAPILLARY
GLUCOSE-CAPILLARY: 133 mg/dL — AB (ref 70–99)
Glucose-Capillary: 124 mg/dL — ABNORMAL HIGH (ref 70–99)
Glucose-Capillary: 133 mg/dL — ABNORMAL HIGH (ref 70–99)

## 2014-02-07 MED ORDER — TORSEMIDE 20 MG PO TABS: 40.0000 mg | ORAL_TABLET | Freq: Every day | ORAL | Status: DC

## 2014-02-07 MED ORDER — POTASSIUM CHLORIDE CRYS ER 20 MEQ PO TBCR
20.0000 meq | EXTENDED_RELEASE_TABLET | Freq: Once | ORAL | Status: AC
  Administered 2014-02-07: 20 meq via ORAL

## 2014-02-07 NOTE — Discharge Instructions (Signed)
You were hospitalized for shortness of breath and leg swelling caused by a Heart Failure Exacerbation. Our testing for other causes of your symptoms was negative. You responded to the Diuretic medicine by making good amounts of urine to reduce the amount of extra fluid in your body. - We made a change to your home diuretic medicine on discharge (please stop taking the Lasix 80mg  daily, and start taking torsemide 40mg  daily). - Check your weight daily and if it starts increasing, seek immediate care. Be very careful with your salt intake. - We contacted your Cardiologist to schedule you a close follow-up in 1-2 weeks. If you do not hear back about this appointment, please call their office next week. - Your Right arm should remain in the splint for support. Please call your [facility] for a follow-up appointment. - Continue to take your chemotherapy medication and follow-up with your Oncologist as needed  - Important to work with Physical Therapy at home to work on improving your strength. Staying mobile will help your breathing as well. - If at any point you believe that you need to return to the [place], you can contact our clinic and we can arrange for a Social Worker to assist you.  If you develop significant worsening shortness of breath, decreased Urine output (medicine not working), leg swelling, weight gain, please call our clinic for an urgent appointment, otherwise return to the Emergency Department for further evaluation.

## 2014-02-07 NOTE — Progress Notes (Signed)
Family Medicine Teaching Service Daily Progress Note Intern Pager: [phone]  Patient name: Anita [name] Medical record number: [id number] Date of birth: 02-24-1946 Age: 67 y.o. Gender: female  Primary Care Provider: [name], DO Consultants: none Code Status: Full (confirmed on admission)  Pt Overview and Major Events to Date:   Assessment and Plan: KISA FUJII is a 68 y.o. female presenting with bilateral leg swelling . PMH is significant for COPD, sCHF/dCHF, DM, previous NSTEMI s/p cath and stage 4 breast cancer. Recent hospitalization 01/10/14 to 01/13/14 for generalized weakness, R-humerus fracture, stable volume status w/o acute CHF exac.  # Dyspnea, suspected acute exac on chronic diastolic CHF - Suspected acute dCHF with peripheral edema, SOB. Last ECHO 02/2013 (EF 22-63%, Grade 1 diastolic dysfxn), elevated ProBNP 613 (prior 379, 01/10/14), POCT Trop (neg). Worked up for DVT / PT with concern for hypercoagulable state with h/o CA, Well's 4, (V/Q scan in ED with low probability for PE). H/o dry weight 235-238. - repeat 2D ECHO (EF 55%, paradoxical septal motion, intraventricular conduction delay). Note intraventricular pathology suspected to be contributing to worsening CHF with non-synchronized ventricular contraction, however pt poor candidate for placement of pacemaker. B/l Dopplers (neg DVT) - strict I/Os, daily wts (224>>227 >> 228 lbs, need to determine new dry wt) - Was on Lasix 56m IV daily with total 8.4L output, today transitioned to torsemide 428mdaily with plan to d/c on this dose if stable. Today, pt is well-appearing with bibasilar crackles but otherwise clear lungs.  - Notified Cardiology who has scheduled follow-up later this month. Also scheduled with our clinic 6/17.  - PT/OT recommend HH, ordered resumption of care for home health services (PT, RN)  # H/o R-arm humerus fracture, s/p repeat fall - Recent hospitalization 12/2013 for generalized weakness, and  pathologic fracture R-humerus, no surgical intervention. [facility] - X-rays R-humerus / R-shoulder: no acute or repeat fracture / sublux, known pathologic fractured humerus -Per ortho, Coaptation / sugar tong splint + ace wrap for support; follow-up with PCP and ortho within 1 week.  # Hypokalemia - 3.4 >> 3.7 >> 3.4 - replace K 40 mEq PO today, then resume 20 mEq PO BID. Repleted an extra 2045mtoday and plan to check outpatient.  # AKI in setting of CKD-III - Improving - baseline creatinine of around 1.3. Creatinine 1.49 on admission. - Improving Cr 1.49 >> 1.63 >> 1.81 >> 1.54 - Hold nephrotoxic meds (ARB). Did not see ARB on med list.  # Stage 4 Breast cancer, metastatic - currently undergoes palliative radiation. Has a pathologic fracture of right humerus. Per chart review, patient also receives Zometa outpatient. - Previously scheduled for radiation therapy 6/11 and 6/12, Dr. KinSondra Comeanceled and notified) - [facility] Asymptomatic bacteria. colonization: - UA in ED significant for leukocyte esterase and bacteria. Patient is asymptomatic. Significant recent history E.Coli UTI in 11/2013, also recent SNF stay for rehab. Suspect colonization. - s/p CTX x 1 dose in ED - Discontinued Keflex PO 500m50mD - Urine culture - (coag neg Staph) - micro considered colonization  # CAD: - Cath November 2014 LAD 40% stenosis, first diagonal 80% stenosis, circumflex 20% stenosis, right coronary artery occluded. The EF was 35-40% at that time. Losartan discontinued upon discharge of last admission. - Continue home ASA, imdur  # COPD: - on 2L [place] at night and for ambulation although patient uses 3L at night and 4L while ambulating for comfort. Uses Umeclidinium-Vilanterol  PRN. Exam is clear other than bibasilar atelectasis today.  - Albuterol PRN - IS  # Diabetes mellitus, type 2 - Metformin discontinued per discharge summary of last admission -  Currently managed with diet - SSI  # Leukopenia - WBCs of 2.5 on admission. Patient currently afebrile. Upon chart review, possibly related to patient starting Ibrance (palbociclib). Patient has microcytic anemia which is chronic. All cell lines not low, so concern for aplastic anemia lower - WBC stable 2.5 >> 2.5 - Afebrile last 24 hours.  # Chronic leg pain: - continue home Norco 1-2 q4hrs PRN for pain  # Hx of poor appetite: - Continue Remeron 58m qhs  FEN/GI: SLIV, heart healthy/carb modified  Prophylaxis: Heparin subq  Disposition: Fluid status stable, [facility].  Subjective: Reports feeling better and wants to go home. Legs still swollen but she does not wear her compression stockings.   Objective: Temp:  [97.7 F (36.5 C)-98.4 F (36.9 C)] 97.7 F (36.5 C) (06/14 0422) Pulse Rate:  [81-96] 81 (06/14 0422) Resp:  [18-20] 20 (06/14 0422) BP: (97-123)/(47-66) 122/49 mmHg (06/14 0422) SpO2:  [95 %-96 %] 96 % (06/14 0422) Weight:  [228 lb 2.8 oz (103.5 kg)] 228 lb 2.8 oz (103.5 kg) (06/14 0422) Physical Exam: General: resting, cooperative, NAD Cardiovascular: distant heart sounds; RRR, no murmur Respiratory: CTAB except mild bibasilar crackles Abdomen: Soft, NTND, obese MSK: Right arm in splint + ace wrap and in sling Ext - Continued bilateral LE foot/ankle/calf edema (+2-3 pitting), minimal erythema and warmth, nontender Skin: Warm, dry. Neuro: Alert and oriented x3  Laboratory:  Recent Labs Lab 02/03/14 1452 02/04/14 0540 02/05/14 0610  WBC 2.5* 2.5* 2.5*  HGB 9.7* 9.6* 9.2*  HCT 29.8* 30.5* 29.0*  PLT 205 187 171    Recent Labs Lab 02/02/14 1438  02/04/14 0540 02/05/14 0610 02/06/14 0717  NA 142  < > 140 141 137  K 4.1  < > 3.4* 3.7 3.4*  CL  --   < > 97 100 96  CO2 26  < > _0 BUN 24.2  < > 28* 30* 32*  CREATININE 1.5*  < > 1.63* 1.81* 1.54*  CALCIUM 9.0  < > 9.1 9.0 9.2  PROT 6.5  --  7.1  --   --    BILITOT 0.38  --  0.4  --   --   ALKPHOS 158*  --  153*  --   --   ALT 19  --  16  --   --   AST 18  --  18  --   --   GLUCOSE 118  < > 133* 129* 133*  < > = values in this interval not displayed. POCT Trop - 0.00 ProBNP - 613 (recent 300, 01/10/14)  Imaging/Diagnostic Tests:  6/10 Portable CXR 1v IMPRESSION:  No acute cardiopulmonary disease.  Re-demonstration of multiple lucent bone lesions. Findings are  compatible with history of multiple myeloma. Difficult to exclude a  pathologic fracture involving the left seventh rib.  6/10 NM V/Q FINDINGS:  Ventilation: No focal ventilation defect.  Perfusion: No wedge shaped peripheral perfusion defects to suggest  acute pulmonary embolism.  IMPRESSION:  Low probability of pulmonary embolus.  6/11 2D ECHO Study Conclusions - Left ventricle: The cavity size was normal. Systolic function was normal. The estimated ejection fraction was 55%. Images were inadequate for LV wall motion assessment. - Ventricular septum: Septal motion showed paradox. These changes are consistent  with intraventricular conduction delay. - Pulmonic valve: There was trivial regurgitation.  6/11 Bilateral LE Duplex Summary: - No evidence of deep vein or superficial thrombosis involving the right lower extremity and left lower extremity. - No evidence of Baker&'s cyst on the right or left.  6/11 X-ray: R-Humerus IMPRESSION:  Again noted displaced healing fracture in distal shaft of right  humerus with healing response and callus formation. No new fracture  or subluxation.  6/11 X-ray R-shoulder IMPRESSION:  1. Degenerative changes right shoulder.  2. Angulated displaced pathologic fracture of the right distal  humerus.  3. Focal lytic lesion in the right mid humeral diaphysis. This  consistent with metastatic disease and/or myeloma . Small focal  lytic rib lesions cannot be excluded.  Hilton Sinclair, MD 02/07/2014, 1:25 PM PGY-2, [place] Intern pager: [phone], text pages welcome

## 2014-02-07 NOTE — Progress Notes (Signed)
Patient states", Family will be to get me at 7 pm".  Currently, she is awaiting their arrival. I have left messages on the family's cell phones because of the late hour.  Charge nurse and physician are aware of the lateness of discharge.

## 2014-02-07 NOTE — Progress Notes (Signed)
Seen and examined.  Agree with Dr. Mcneil Sober management and documentation.  Anita [name] still has an impressive burden of chronic medical problems.  Still, she has reached max benefit from acute hospitalization and is ready for DC today.

## 2014-02-08 ENCOUNTER — Ambulatory Visit
Admission: RE | Admit: 2014-02-08 | Discharge: 2014-02-08 | Disposition: A | Discharge status: Home / Self Care | Payer: Medicare HMO | Source: Ambulatory Visit | Attending: Radiation Oncology | Admitting: Radiation Oncology

## 2014-02-08 DIAGNOSIS — Z510 Encounter for antineoplastic radiation therapy: Secondary | ICD-10-CM | POA: Diagnosis not present

## 2014-02-08 DIAGNOSIS — G893 Neoplasm related pain (acute) (chronic): Secondary | ICD-10-CM | POA: Diagnosis not present

## 2014-02-08 DIAGNOSIS — M84429A Pathological fracture, unspecified humerus, initial encounter for fracture: Secondary | ICD-10-CM | POA: Diagnosis not present

## 2014-02-08 DIAGNOSIS — N949 Unspecified condition associated with female genital organs and menstrual cycle: Secondary | ICD-10-CM | POA: Diagnosis not present

## 2014-02-08 DIAGNOSIS — C50919 Malignant neoplasm of unspecified site of unspecified female breast: Secondary | ICD-10-CM | POA: Diagnosis not present

## 2014-02-08 DIAGNOSIS — I509 Heart failure, unspecified: Secondary | ICD-10-CM | POA: Diagnosis not present

## 2014-02-08 DIAGNOSIS — Z7982 Long term (current) use of aspirin: Secondary | ICD-10-CM | POA: Diagnosis not present

## 2014-02-08 DIAGNOSIS — C7951 Secondary malignant neoplasm of bone: Secondary | ICD-10-CM | POA: Diagnosis not present

## 2014-02-08 DIAGNOSIS — R609 Edema, unspecified: Secondary | ICD-10-CM | POA: Diagnosis not present

## 2014-02-08 MED FILL — Cholecalciferol Tab 25 MCG (1000 Unit): ORAL | Qty: 1 | Status: AC

## 2014-02-09 ENCOUNTER — Ambulatory Visit
Admission: RE | Admit: 2014-02-09 | Discharge: 2014-02-09 | Disposition: A | Discharge status: Home / Self Care | Payer: Medicare HMO | Source: Ambulatory Visit | Attending: Radiation Oncology | Admitting: Radiation Oncology

## 2014-02-09 VITALS — BP 124/72 | HR 70 | Temp 98.7°F | Ht 62.0 in | Wt 234.2 lb

## 2014-02-09 DIAGNOSIS — C50919 Malignant neoplasm of unspecified site of unspecified female breast: Secondary | ICD-10-CM

## 2014-02-09 DIAGNOSIS — Z510 Encounter for antineoplastic radiation therapy: Secondary | ICD-10-CM | POA: Diagnosis not present

## 2014-02-09 DIAGNOSIS — C7951 Secondary malignant neoplasm of bone: Principal | ICD-10-CM

## 2014-02-09 NOTE — Progress Notes (Signed)
  Radiation Oncology         [phone] ________________________________  Name: Anita [name] MRN: [id number]  Date: 02/09/2014  DOB: Dec 23, 1945  Weekly Radiation Therapy Management  Diagnosis: Metastatic breast cancer  Current Dose: 12 Gy     Planned Dose:  30 Gy  Narrative . . . . . . . . The patient presents for routine under treatment assessment.                                   The patient is without complaint.  She has been discharged from the hospital after management of her congestive heart failure. She is breathing easier. She has noticed improvement in her pain in both legs. She does have a soft cast present along her right arm                                 Set-up films were reviewed.                                 The chart was checked. Physical Findings. . .  height is 5\' 2"  (1.575 m) and weight is 234 lb 3.2 oz (106.232 kg). Her oral temperature is 98.7 F (37.1 C). Her blood pressure is 124/72 and her pulse is 70. Her oxygen saturation is 95%. . The lungs are clear. The heart has a regular rhythm and rate.  Impression . . . . . . . The patient is tolerating radiation. Plan . . . . . . . . . . . . Continue treatment as planned.  ________________________________   Blair Promise, PhD, MD

## 2014-02-09 NOTE — Progress Notes (Signed)
Anita [name] has had 4 fractions to her right femur/pelvis and left femur.  She denies pain now but says she has pain mostly at night in her right leg/kneecap.  She is taking norco usually 2 tablets a day. She reports walking at home with a cane. She denies fatigue.  She is wondering about treating her left arm.  She wants to be finshed by 8/17 when she starts school.  She reports that her legs are less swollen.  Pitting edema noted in both feet.  She was given the Radiation Therapy and You book and reviewed potential side effects/management of fatigue and skin changes.  She was given radiaplex and was instructed to apply it after treatment and bedtime if she noticed skin irritation.

## 2014-02-10 ENCOUNTER — Ambulatory Visit
Admission: RE | Admit: 2014-02-10 | Discharge: 2014-02-10 | Disposition: A | Discharge status: Home / Self Care | Payer: Medicare HMO | Source: Ambulatory Visit | Attending: Radiation Oncology | Admitting: Radiation Oncology

## 2014-02-10 ENCOUNTER — Inpatient Hospital Stay: Payer: Medicare HMO | Admitting: Family Medicine

## 2014-02-10 ENCOUNTER — Telehealth: Payer: Self-pay | Admitting: Family Medicine

## 2014-02-10 DIAGNOSIS — Z510 Encounter for antineoplastic radiation therapy: Secondary | ICD-10-CM | POA: Diagnosis not present

## 2014-02-10 NOTE — Telephone Encounter (Signed)
Anita [name], Physical therapist calls for verbal orders for PT to continue care. Please call him at [phone] with orders.

## 2014-02-10 NOTE — Telephone Encounter (Signed)
Verbal order given  

## 2014-02-10 NOTE — Telephone Encounter (Signed)
please advise.Thank you.Arul Farabee, Lewie Loron

## 2014-02-11 ENCOUNTER — Ambulatory Visit
Admission: RE | Admit: 2014-02-11 | Discharge: 2014-02-11 | Disposition: A | Discharge status: Home / Self Care | Payer: Medicare HMO | Source: Ambulatory Visit | Attending: Radiation Oncology | Admitting: Radiation Oncology

## 2014-02-11 DIAGNOSIS — Z510 Encounter for antineoplastic radiation therapy: Secondary | ICD-10-CM | POA: Diagnosis not present

## 2014-02-11 NOTE — Discharge Summary (Signed)
Seen and examined on the day of DC.  Agree with Dr. Marthann Schiller documentation and management.

## 2014-02-12 ENCOUNTER — Ambulatory Visit
Admission: RE | Admit: 2014-02-12 | Discharge: 2014-02-12 | Disposition: A | Discharge status: Home / Self Care | Payer: Medicare HMO | Source: Ambulatory Visit | Attending: Radiation Oncology | Admitting: Radiation Oncology

## 2014-02-12 DIAGNOSIS — Z510 Encounter for antineoplastic radiation therapy: Secondary | ICD-10-CM | POA: Diagnosis not present

## 2014-02-14 ENCOUNTER — Telehealth: Payer: Self-pay | Admitting: Family Medicine

## 2014-02-14 NOTE — Telephone Encounter (Signed)
Anita [name] is a 68 y.o. female with recent hospital discharge last week for CHF. Patient states that she is having increased swelling in her legs today. She has taken her normal Lasix dose and is wondering if it is okay to take one more 80 mg by mouth dose of Lasix. She denies any shortness of breath or chest pain. Patient has an ejection fraction of 55% from the echo last week. - Gave patient permission to take one more dose of Lasix this evening. Discussed in great detail with patient that if for any reason she experiences chest pain or shortness of breath she is coming to the emergency room for evaluation. - Patient is to schedule a same day appointment for tomorrow at family practice. Patient is aware to call first thing in the morning to make her appointment. Kuneff, Renee DO

## 2014-02-14 NOTE — Telephone Encounter (Signed)
Emergency Line Call  Pt reports increased leg swelling for a couple of days, to the point of making it difficult for her to walk. She denies chest pain or SOB and states she is taking Lasix 80 mg PO once a day, and states she is using TED hose. She reports she is elevating her legs, as well, but only propping them on two pillows, not above the level of her heart. She states she "feels okay" otherwise and does not think she is urinating any less than normal.  Advised pt to continue elevating her legs but to get them above the level of her heart, especially while sleeping and during the day as much as possible. Instructed pt to take an extra dose of Lasix tomorrow, and to call the clinic Monday for a close follow-up visit. Advised her to call 911 or come to the ED if her symptoms worsen or if she develops frank chest pain, worse leg swelling, or is unable to get around to care for herself. Pt voiced understanding but hung up before answering whether or not she had any further questions.   Will route note to PCP, Dr. Lacinda Axon.  Emmaline Kluver, MD PGY-2, [place] [facility] 02/14/2014, 12:08 AM

## 2014-02-15 ENCOUNTER — Inpatient Hospital Stay (HOSPITAL_COMMUNITY)
Admission: EM | Admit: 2014-02-15 | Discharge: 2014-02-18 | DRG: 543 | Disposition: A | Discharge status: Home Health | Payer: Medicare HMO | Attending: Family Medicine | Admitting: Family Medicine

## 2014-02-15 ENCOUNTER — Telehealth: Payer: Self-pay

## 2014-02-15 ENCOUNTER — Telehealth: Payer: Self-pay | Admitting: Family Medicine

## 2014-02-15 ENCOUNTER — Telehealth: Payer: Self-pay | Admitting: Cardiology

## 2014-02-15 ENCOUNTER — Encounter (HOSPITAL_COMMUNITY): Payer: Self-pay | Admitting: Emergency Medicine

## 2014-02-15 ENCOUNTER — Telehealth: Payer: Self-pay | Admitting: Pulmonary Disease

## 2014-02-15 ENCOUNTER — Emergency Department (HOSPITAL_COMMUNITY): Payer: Medicare HMO

## 2014-02-15 ENCOUNTER — Ambulatory Visit: Payer: Medicare HMO

## 2014-02-15 DIAGNOSIS — J42 Unspecified chronic bronchitis: Secondary | ICD-10-CM

## 2014-02-15 DIAGNOSIS — J4489 Other specified chronic obstructive pulmonary disease: Secondary | ICD-10-CM | POA: Diagnosis present

## 2014-02-15 DIAGNOSIS — E669 Obesity, unspecified: Secondary | ICD-10-CM | POA: Diagnosis present

## 2014-02-15 DIAGNOSIS — Z6841 Body Mass Index (BMI) 40.0 and over, adult: Secondary | ICD-10-CM | POA: Diagnosis not present

## 2014-02-15 DIAGNOSIS — J9610 Chronic respiratory failure, unspecified whether with hypoxia or hypercapnia: Secondary | ICD-10-CM | POA: Diagnosis present

## 2014-02-15 DIAGNOSIS — C50919 Malignant neoplasm of unspecified site of unspecified female breast: Secondary | ICD-10-CM

## 2014-02-15 DIAGNOSIS — G8929 Other chronic pain: Secondary | ICD-10-CM | POA: Diagnosis present

## 2014-02-15 DIAGNOSIS — C50911 Malignant neoplasm of unspecified site of right female breast: Secondary | ICD-10-CM

## 2014-02-15 DIAGNOSIS — Z9981 Dependence on supplemental oxygen: Secondary | ICD-10-CM

## 2014-02-15 DIAGNOSIS — R5381 Other malaise: Secondary | ICD-10-CM | POA: Diagnosis present

## 2014-02-15 DIAGNOSIS — J9611 Chronic respiratory failure with hypoxia: Secondary | ICD-10-CM

## 2014-02-15 DIAGNOSIS — S42301D Unspecified fracture of shaft of humerus, right arm, subsequent encounter for fracture with routine healing: Secondary | ICD-10-CM

## 2014-02-15 DIAGNOSIS — D069 Carcinoma in situ of cervix, unspecified: Secondary | ICD-10-CM | POA: Diagnosis present

## 2014-02-15 DIAGNOSIS — F0390 Unspecified dementia without behavioral disturbance: Secondary | ICD-10-CM | POA: Diagnosis present

## 2014-02-15 DIAGNOSIS — I252 Old myocardial infarction: Secondary | ICD-10-CM | POA: Diagnosis not present

## 2014-02-15 DIAGNOSIS — I509 Heart failure, unspecified: Secondary | ICD-10-CM | POA: Diagnosis present

## 2014-02-15 DIAGNOSIS — Z888 Allergy status to other drugs, medicaments and biological substances status: Secondary | ICD-10-CM | POA: Diagnosis not present

## 2014-02-15 DIAGNOSIS — Z87891 Personal history of nicotine dependence: Secondary | ICD-10-CM | POA: Diagnosis not present

## 2014-02-15 DIAGNOSIS — E119 Type 2 diabetes mellitus without complications: Secondary | ICD-10-CM | POA: Diagnosis present

## 2014-02-15 DIAGNOSIS — R29898 Other symptoms and signs involving the musculoskeletal system: Secondary | ICD-10-CM | POA: Diagnosis present

## 2014-02-15 DIAGNOSIS — Z8249 Family history of ischemic heart disease and other diseases of the circulatory system: Secondary | ICD-10-CM

## 2014-02-15 DIAGNOSIS — R609 Edema, unspecified: Secondary | ICD-10-CM | POA: Diagnosis present

## 2014-02-15 DIAGNOSIS — R5081 Fever presenting with conditions classified elsewhere: Secondary | ICD-10-CM | POA: Diagnosis present

## 2014-02-15 DIAGNOSIS — E876 Hypokalemia: Secondary | ICD-10-CM | POA: Diagnosis not present

## 2014-02-15 DIAGNOSIS — E0590 Thyrotoxicosis, unspecified without thyrotoxic crisis or storm: Secondary | ICD-10-CM | POA: Diagnosis present

## 2014-02-15 DIAGNOSIS — J449 Chronic obstructive pulmonary disease, unspecified: Secondary | ICD-10-CM | POA: Diagnosis present

## 2014-02-15 DIAGNOSIS — Z833 Family history of diabetes mellitus: Secondary | ICD-10-CM | POA: Diagnosis not present

## 2014-02-15 DIAGNOSIS — I5042 Chronic combined systolic (congestive) and diastolic (congestive) heart failure: Secondary | ICD-10-CM | POA: Diagnosis present

## 2014-02-15 DIAGNOSIS — D638 Anemia in other chronic diseases classified elsewhere: Secondary | ICD-10-CM | POA: Diagnosis present

## 2014-02-15 DIAGNOSIS — M84429A Pathological fracture, unspecified humerus, initial encounter for fracture: Secondary | ICD-10-CM | POA: Diagnosis present

## 2014-02-15 DIAGNOSIS — N183 Chronic kidney disease, stage 3 unspecified: Secondary | ICD-10-CM | POA: Diagnosis present

## 2014-02-15 DIAGNOSIS — B372 Candidiasis of skin and nail: Secondary | ICD-10-CM | POA: Diagnosis present

## 2014-02-15 DIAGNOSIS — C7951 Secondary malignant neoplasm of bone: Principal | ICD-10-CM | POA: Diagnosis present

## 2014-02-15 DIAGNOSIS — J383 Other diseases of vocal cords: Secondary | ICD-10-CM

## 2014-02-15 DIAGNOSIS — Z7982 Long term (current) use of aspirin: Secondary | ICD-10-CM

## 2014-02-15 DIAGNOSIS — I872 Venous insufficiency (chronic) (peripheral): Secondary | ICD-10-CM | POA: Diagnosis present

## 2014-02-15 DIAGNOSIS — I209 Angina pectoris, unspecified: Secondary | ICD-10-CM

## 2014-02-15 DIAGNOSIS — C7952 Secondary malignant neoplasm of bone marrow: Secondary | ICD-10-CM | POA: Diagnosis present

## 2014-02-15 DIAGNOSIS — I2510 Atherosclerotic heart disease of native coronary artery without angina pectoris: Secondary | ICD-10-CM

## 2014-02-15 DIAGNOSIS — Z853 Personal history of malignant neoplasm of breast: Secondary | ICD-10-CM

## 2014-02-15 DIAGNOSIS — Z91041 Radiographic dye allergy status: Secondary | ICD-10-CM | POA: Diagnosis not present

## 2014-02-15 DIAGNOSIS — T451X5A Adverse effect of antineoplastic and immunosuppressive drugs, initial encounter: Secondary | ICD-10-CM | POA: Diagnosis present

## 2014-02-15 DIAGNOSIS — R0902 Hypoxemia: Secondary | ICD-10-CM

## 2014-02-15 DIAGNOSIS — D709 Neutropenia, unspecified: Secondary | ICD-10-CM | POA: Diagnosis present

## 2014-02-15 DIAGNOSIS — I25119 Atherosclerotic heart disease of native coronary artery with unspecified angina pectoris: Secondary | ICD-10-CM

## 2014-02-15 LAB — COMPREHENSIVE METABOLIC PANEL
ALK PHOS: 132 U/L — AB (ref 39–117)
ALT: 10 U/L (ref 0–35)
AST: 10 U/L (ref 0–37)
Albumin: 3.3 g/dL — ABNORMAL LOW (ref 3.5–5.2)
BILIRUBIN TOTAL: 0.4 mg/dL (ref 0.3–1.2)
BUN: 20 mg/dL (ref 6–23)
CHLORIDE: 102 meq/L (ref 96–112)
CO2: 25 mEq/L (ref 19–32)
Calcium: 9 mg/dL (ref 8.4–10.5)
Creatinine, Ser: 1.46 mg/dL — ABNORMAL HIGH (ref 0.50–1.10)
GFR calc Af Amer: 42 mL/min — ABNORMAL LOW (ref >90)
GFR calc non Af Amer: 36 mL/min — ABNORMAL LOW (ref >90)
Glucose, Bld: 140 mg/dL — ABNORMAL HIGH (ref 70–99)
POTASSIUM: 3.4 meq/L — AB (ref 3.7–5.3)
Sodium: 142 mEq/L (ref 137–147)
Total Protein: 6.5 g/dL (ref 6.0–8.3)

## 2014-02-15 LAB — CBC
HCT: 27.6 % — ABNORMAL LOW (ref 36.0–46.0)
HEMOGLOBIN: 8.7 g/dL — AB (ref 12.0–15.0)
MCH: 25.1 pg — ABNORMAL LOW (ref 26.0–34.0)
MCHC: 31.5 g/dL (ref 30.0–36.0)
MCV: 79.5 fL (ref 78.0–100.0)
Platelets: 189 10*3/uL (ref 150–400)
RBC: 3.47 MIL/uL — ABNORMAL LOW (ref 3.87–5.11)
RDW: 20.5 % — AB (ref 11.5–15.5)
WBC: 1.7 10*3/uL — ABNORMAL LOW (ref 4.0–10.5)

## 2014-02-15 LAB — I-STAT TROPONIN, ED: TROPONIN I, POC: 0.02 ng/mL (ref 0.00–0.08)

## 2014-02-15 LAB — PRO B NATRIURETIC PEPTIDE: PRO B NATRI PEPTIDE: 1201 pg/mL — AB (ref 0–125)

## 2014-02-15 MED ORDER — HYDROCODONE-ACETAMINOPHEN 5-325 MG PO TABS
2.0000 | ORAL_TABLET | ORAL | Status: DC | PRN
  Administered 2014-02-15: 2 via ORAL
  Filled 2014-02-15: qty 2

## 2014-02-15 MED ORDER — PALBOCICLIB 125 MG PO CAPS
125.0000 mg | ORAL_CAPSULE | Freq: Every day | ORAL | Status: DC
  Administered 2014-02-18: 125 mg via ORAL
  Filled 2014-02-15: qty 1

## 2014-02-15 MED ORDER — INSULIN ASPART 100 UNIT/ML ~~LOC~~ SOLN
0.0000 [IU] | Freq: Three times a day (TID) | SUBCUTANEOUS | Status: DC
  Administered 2014-02-16 (×2): 1 [IU] via SUBCUTANEOUS
  Administered 2014-02-17 – 2014-02-18 (×3): 2 [IU] via SUBCUTANEOUS

## 2014-02-15 MED ORDER — ATORVASTATIN CALCIUM 40 MG PO TABS
40.0000 mg | ORAL_TABLET | Freq: Every day | ORAL | Status: DC
  Administered 2014-02-15 – 2014-02-17 (×3): 40 mg via ORAL
  Filled 2014-02-15 (×4): qty 1

## 2014-02-15 MED ORDER — SODIUM CHLORIDE 0.9 % IV SOLN: 250.0000 mL | INTRAVENOUS | Status: DC | PRN

## 2014-02-15 MED ORDER — ALBUTEROL SULFATE HFA 108 (90 BASE) MCG/ACT IN AERS: 2.0000 | INHALATION_SPRAY | Freq: Two times a day (BID) | RESPIRATORY_TRACT | Status: DC | PRN

## 2014-02-15 MED ORDER — POTASSIUM CHLORIDE CRYS ER 20 MEQ PO TBCR
40.0000 meq | EXTENDED_RELEASE_TABLET | Freq: Once | ORAL | Status: AC
  Administered 2014-02-15: 40 meq via ORAL
  Filled 2014-02-15: qty 2

## 2014-02-15 MED ORDER — HEPARIN SODIUM (PORCINE) 5000 UNIT/ML IJ SOLN
5000.0000 [IU] | Freq: Three times a day (TID) | INTRAMUSCULAR | Status: DC
  Administered 2014-02-16 – 2014-02-17 (×3): 5000 [IU] via SUBCUTANEOUS
  Filled 2014-02-15 (×11): qty 1

## 2014-02-15 MED ORDER — SODIUM CHLORIDE 0.9 % IV BOLUS (SEPSIS)
500.0000 mL | Freq: Once | INTRAVENOUS | Status: AC
  Administered 2014-02-15: 500 mL via INTRAVENOUS

## 2014-02-15 MED ORDER — SODIUM CHLORIDE 0.9 % IJ SOLN: 3.0000 mL | INTRAMUSCULAR | Status: DC | PRN

## 2014-02-15 MED ORDER — SODIUM CHLORIDE 0.9 % IJ SOLN
3.0000 mL | Freq: Two times a day (BID) | INTRAMUSCULAR | Status: DC
  Administered 2014-02-17 – 2014-02-18 (×3): 3 mL via INTRAVENOUS

## 2014-02-15 MED ORDER — ALBUTEROL SULFATE (2.5 MG/3ML) 0.083% IN NEBU: 2.5000 mg | INHALATION_SOLUTION | Freq: Two times a day (BID) | RESPIRATORY_TRACT | Status: DC | PRN

## 2014-02-15 MED ORDER — SODIUM CHLORIDE 0.9 % IJ SOLN
3.0000 mL | Freq: Two times a day (BID) | INTRAMUSCULAR | Status: DC
  Administered 2014-02-15 – 2014-02-16 (×2): 3 mL via INTRAVENOUS

## 2014-02-15 MED ORDER — MIRTAZAPINE 15 MG PO TABS
15.0000 mg | ORAL_TABLET | Freq: Every day | ORAL | Status: DC
  Administered 2014-02-15 – 2014-02-17 (×3): 15 mg via ORAL
  Filled 2014-02-15 (×4): qty 1

## 2014-02-15 MED ORDER — ASPIRIN EC 81 MG PO TBEC
81.0000 mg | DELAYED_RELEASE_TABLET | Freq: Every day | ORAL | Status: DC
  Administered 2014-02-16 – 2014-02-18 (×3): 81 mg via ORAL
  Filled 2014-02-15 (×3): qty 1

## 2014-02-15 MED ORDER — LETROZOLE 2.5 MG PO TABS
2.5000 mg | ORAL_TABLET | Freq: Every day | ORAL | Status: DC
  Administered 2014-02-16 – 2014-02-18 (×3): 2.5 mg via ORAL
  Filled 2014-02-15 (×3): qty 1

## 2014-02-15 MED ORDER — UMECLIDINIUM-VILANTEROL 62.5-25 MCG/INH IN AEPB
1.0000 | INHALATION_SPRAY | Freq: Every day | RESPIRATORY_TRACT | Status: DC
Start: 2014-02-15 — End: 2014-02-18

## 2014-02-15 MED ORDER — FUROSEMIDE 10 MG/ML IJ SOLN
40.0000 mg | Freq: Once | INTRAMUSCULAR | Status: AC
  Administered 2014-02-15: 40 mg via INTRAVENOUS

## 2014-02-15 MED ORDER — POTASSIUM CHLORIDE CRYS ER 20 MEQ PO TBCR
20.0000 meq | EXTENDED_RELEASE_TABLET | Freq: Once | ORAL | Status: AC
  Administered 2014-02-15: 20 meq via ORAL
  Filled 2014-02-15: qty 1

## 2014-02-15 MED ORDER — FUROSEMIDE 10 MG/ML IJ SOLN
80.0000 mg | Freq: Once | INTRAMUSCULAR | Status: DC
  Filled 2014-02-15: qty 8

## 2014-02-15 NOTE — Telephone Encounter (Signed)
Spoke with the pt and notified of recs per VS Pt verbalized understanding

## 2014-02-15 NOTE — Progress Notes (Signed)
  Admission note:  Arrival Method: stretcher from ED Mental Orientation: A&O x 4 Telemetry: placed on telemetry box 22, CCMD notified Assessment: assessed patient, see doc flowsheets Skin: moisture injury to buttocks- areas of red blanchable skin.  Areas of redness with open sores under panus and in groin.  Antifungal powder applied to groin.   IV: Left Antecubital, NSL Pain: pain with activity, patient at rest Tubes: n/a Safety Measures: Educated patient on fall precautions.   Admission Screening:  6700 Orientation: Patient has been oriented to the unit, staff and to the room.

## 2014-02-15 NOTE — Telephone Encounter (Signed)
Cindy from [facility] called and wanted to know if patient can take another lasix or if PCP wants her seen or go to ED. Patient declines seeing any other doctor but her own. This patient has also called three other specialists about this issue before the home nurse got to her.

## 2014-02-15 NOTE — Telephone Encounter (Signed)
Returning pt call. Pt states her legs are swollen and painful where she can't walk. She is cancelling her rad onc appt b/c she cannot walk her steps. She took 120 lasix yesterday and 80 today. She said she did NOT use the demadex prescribed by the hospital MD on discharge. "I like lasix". She has home health nurse coming to her house at noon. S/w dr Juliann Mule and advised pt to call her family MD after the nurse gets to examine her legs. She expressed understanding.

## 2014-02-15 NOTE — H&P (Signed)
[facility] Admission History and Physical Service Pager: [phone]  Patient name: Anita [name] Medical record number: [id number] Date of birth: December 22, 1945 Age: 69 y.o. Gender: female  Primary Care Provider: [name], DO Consultants: None Code Status: Full Code  Chief Complaint: LE weakness  Assessment and Plan: Anita [name] is a 68 y.o. female presenting with lower extremity weakness/pain and inability to ambulate. PMH is significant for CAD (NSTEMI x 3), chronic combined CHF, CKD stage 3 (recent basline Creatinine ~ 1.3 - 1.5), DM-2, COPD, Chronic respiratory failure on home O2, and Metastatic breast cancer.  LE weakness/Inability to ambulate - Patient is concerned that this is related to her underlying heart failure and feels like her lower extremity edema is worse and is interfering with ambulation.  Clinically, patient appears to be at baseline regarding her CHF/LE edema (see below).  This is likely secondary to her poor functional status and underlying metastatic breast cancer. - Will admit to Telemetry, attending Dr. Mingo Amber - PT eval to assess/aid ambulation and functional status - Social Work consult regarding potential SNF placement - Palliative care consult for continued GOC and potential hospice care  CHF, combined - Chest xray negative for pulmonary edema.  LE edema appears at baseline (patient has chronic 1+ LE edema).   - No evidence of acute exacerbation. - BP currently low, and patient appears at baseline.  Therefore, will hold home lasix currently. - Strict I/O's and Daily weights - Will continue close monitoring during admission; Will likely restart home Lasix tomorrow.  Metastatic Breast cancer - Patient is currently undergoing palliative radiation. - Continuing home Ibrance and Letrozole - Palliative medicine consult  Anemia/leukopenia - Secondary to underlying chronic illness and Ibrance. - Will monitor closely during admission.  CBC with Diff in the am.  CAD (NSTEMI x 3) - Continue Aspirin and Lipitor  COPD, Chronic respiratory failure - Continuing home O2, Albuterol and Umeclidinium-Vilanterol  DM-2  - CBG's TID AC and QHS - SSI - Sensitive  CKD III - Creatinine is at baseline - Will continue to monitor closely.  FEN/GI: SL IV; Heart Healthy/Carb modified diet Prophylaxis: Heparin SQ  Disposition: SNF vs Hospice  History of Present Illness: Anita [name] is a 68 y.o. female with a complex PMH presents with complaints of LE swelling, weakness, and inability to ambulate.  Patient reports that she has been experiencing increasing lower extremity edema for several days.  She has been compliant with her home Lasix and has recently taken additional doses to improve her edema.  She states that this has been minimally effective.    She reports that yesterday she developed lower extremity weakness and pain and was unable to walk.  She denies any other associated symptoms: No shortness of breath, chest pain, nausea, vomiting, fevers, chills.  This has continued to persist today, prompting her to go to the emergency department for evaluation. She reports that she feels well but is concerned about why she is unable to walk.    Review Of Systems: Per HPI.  Otherwise 12 point review of systems was performed and was unremarkable.  Patient Active Problem List   Diagnosis Date Noted  . Breast cancer metastasized to bone 10/14/2012    Priority: High  . Chronic respiratory failure 10/06/2012    Priority: High  . Chronic combined systolic and diastolic congestive heart failure 10/06/2012    Priority: High  . CAD (coronary artery disease) 10/06/2012    Priority: High  . Hyperlipidemia 02/11/2013  Priority: Medium  . Chronic kidney disease 10/06/2012    Priority: Medium  . Anemia of chronic disease 10/06/2012    Priority: Medium  . COPD (chronic obstructive pulmonary disease) 09/27/2012    Priority: Medium  .  DM2 (diabetes mellitus, type 2) 09/26/2012    Priority: Medium  . Lower extremity weakness 02/15/2014  . CHF (congestive heart failure) 02/03/2014  . Closed right arm fracture 01/10/2014  . Weakness 01/10/2014  . Malodorous urine 12/06/2013  . Intertrigo 08/16/2013  . Vaginal yeast infection 08/16/2013  . 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 Insomnia 03/23/2013  . Breast mass, right 10/06/2012  . History of tobacco abuse 10/06/2012   Past Medical History: Past Medical History  Diagnosis Date  . Fibroid   . Morbid obesity   . 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 COPD (chronic obstructive pulmonary disease)   . Chronic diastolic CHF (congestive heart failure)   . NSTEMI (non-ST elevated myocardial infarction)     Cath November 2014 LAD 40% stenosis, first diagonal 80% stenosis, circumflex 20% stenosis, right coronary artery occluded. The EF was 35-40% at that time.  . Anemia     a. Adm 09/2012 with melena, Hgb 5.8 -> transfused. EGD/colonoscopy unrevealing.  . Breast cancer     a. Mets to bone. ER 100%/ PR 0%/Her2 neu negative.  [name] Ulcers of both lower legs   . Tobacco abuse   . Hyperlipidemia 02/11/2013  . Chronic respiratory failure     a. On O2 qhs. also portable O2.  . Chronic renal insufficiency   . Lesion of vocal cord     a. CT 05/2013 concerning for tumor.  . Shortness of breath   . Depression   . Diabetes mellitus without complication   . Anginal pain   . Dementia     very mild   Past Surgical History: Past Surgical History  Procedure Laterality Date  . Colonoscopy, esophagogastroduodenoscopy (egd) and esophageal dilation N/A 10/13/2012    Procedure: colonoscopy and egd;  Surgeon: Graylin Shiver, MD;  Location: [facility] ENDOSCOPY;  Service: Endoscopy;  Laterality: N/A;   Social History: History  Substance Use Topics  . Smoking status: Former Smoker -- 2.00 packs/day for 40 years    Types: Cigarettes    Quit date: 09/24/2012  .  Smokeless tobacco: Never Used  . Alcohol Use: No   Family History: Family History  Problem Relation Age of Onset  . Cancer Mother     leukemia  . Hypertension Mother   . Diabetes Father   . Heart disease Father     passed away due to heart attack   Allergies and Medications: Allergies  Allergen Reactions  . Omnipaque [Iohexol]     Pt claims she developed hives after given contrast  . Benzene Rash   No current facility-administered medications on file prior to encounter.   Current Outpatient Prescriptions on File Prior to Encounter  Medication Sig Dispense Refill  . albuterol (PROVENTIL HFA;VENTOLIN HFA) 108 (90 BASE) MCG/ACT inhaler Inhale 2 puffs into the lungs 2 (two) times daily as needed for wheezing or shortness of breath.       [name] aspirin EC 81 MG EC tablet Take 1 tablet (81 mg total) by mouth daily.  30 tablet  3  . atorvastatin (LIPITOR) 40 MG tablet Take 1 tablet (40 mg total) by mouth daily at 6 PM.  30 tablet  0  . furosemide (LASIX)  40 MG tablet Take 80 mg by mouth daily.       [name] HYDROcodone-acetaminophen (NORCO/VICODIN) 5-325 MG per tablet Take 2 tablets by mouth every 4 (four) hours as needed for moderate pain.  30 tablet  0  . isosorbide mononitrate (IMDUR) 30 MG 24 hr tablet Take 1 tablet (30 mg total) by mouth daily.  90 tablet  0  . letrozole (FEMARA) 2.5 MG tablet Take 1 tablet (2.5 mg total) by mouth daily.  90 tablet  3  . mirtazapine (REMERON) 15 MG tablet Take 1 tablet (15 mg total) by mouth at bedtime.  30 tablet  0  . nitroGLYCERIN (NITROSTAT) 0.4 MG SL tablet Place 1 tablet (0.4 mg total) under the tongue every 5 (five) minutes as needed for chest pain.  30 tablet  0  . NON FORMULARY Place 3 L into the nose as needed (oxygen).       . palbociclib (IBRANCE) 125 MG capsule Take 125 mg by mouth daily with breakfast. Take whole with food.      . potassium chloride SA (K-DUR,KLOR-CON) 20 MEQ tablet Take 1-2 tablets (20-40 mEq total) by mouth 2 (two) times daily.  2 tablets (40 mEq) AM and 1 tablet ( 20 mEq) PM      . Umeclidinium-Vilanterol 62.5-25 MCG/INH AEPB Inhale 1 puff into the lungs as needed (for COPD).      [name] VITAMIN D, CHOLECALCIFEROL, PO Take 1,000 mg by mouth daily.         Objective: BP 93/46  Pulse 87  Resp 16  Wt 209 lb (94.802 kg)  SpO2 96% Exam: General:  chronically ill-appearing obese female lying in bed, nasal cannula oxygen in place, no apparent distress  HEENT: NCAT. Dry mucous membranes Cardiovascular: RRR. No m/r/g. Respiratory: Normal work of breathing. Mild bibasilar rales noted.  Abdomen: obese, soft, nontender, nondistended. Extremities: 1+ pitting LE edema noted to shin/mid calf.  Mild erythema noted bilaterally at the ankles as well, likely secondary to venous stasis and chronic LE edema.   Skin: warm, dry.  A few eschars noted on anterior ly LE's bilaterally. Neuro: No focal deficits on exam.    Labs and Imaging: CBC BMET   Recent Labs Lab 02/15/14 1350  WBC 1.7*  HGB 8.7*  HCT 27.6*  PLT 189    Recent Labs Lab 02/15/14 1350  NA 142  K 3.4*  CL 102  CO2 25  BUN 20  CREATININE 1.46*  GLUCOSE 140*  CALCIUM 9.0     Dg Chest 2 View 02/15/2014 IMPRESSION: No acute cardiopulmonary abnormality seen.    Coral Spikes, DO 02/15/2014, 4:39 PM PGY-2 [facility] pager: [phone], text pages welcome

## 2014-02-15 NOTE — ED Provider Notes (Signed)
CSN: [id number]     Arrival date & time 02/15/14  1322 History   First MD Initiated Contact with Patient 02/15/14 1324     Chief Complaint  Patient presents with  . Leg Swelling     HPI Patient presents to the emergency room with complaints of increased lower extremity swelling. Patient has history of multiple medical problems as listed in her past medical history. She was recently in the hospital for issues with leg swelling. Patient was discharged approximately a week ago. Patient states over this weekend she started developing increased lower extremity swelling. She's not having any trouble with chest pain or shortness of breath. She called doctors. Her home health nurse evaluated her and recommended she come to the emergency department today. Patient was prescribed torsemide after her last hospitalization. Patient did not want to start taking that medication. She prefers taking her Lasix. She denies any history of DVT or PE. Is not having any trouble with fevers or chills. Past Medical History  Diagnosis Date  . Fibroid   . Morbid obesity   . 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 COPD (chronic obstructive pulmonary disease)   . Chronic diastolic CHF (congestive heart failure)   . NSTEMI (non-ST elevated myocardial infarction)     Cath November 2014 LAD 40% stenosis, first diagonal 80% stenosis, circumflex 20% stenosis, right coronary artery occluded. The EF was 35-40% at that time.  . Anemia     a. Adm 09/2012 with melena, Hgb 5.8 -> transfused. EGD/colonoscopy unrevealing.  . Breast cancer     a. Mets to bone. ER 100%/ PR 0%/Her2 neu negative.  [name] Ulcers of both lower legs   . Tobacco abuse   . Hyperlipidemia 02/11/2013  . Chronic respiratory failure     a. On O2 qhs. also portable O2.  . Chronic renal insufficiency   . Lesion of vocal cord     a. CT 05/2013 concerning for tumor.  . Shortness of breath   . Depression   . Diabetes mellitus  without complication   . Anginal pain   . Dementia     very mild   Past Surgical History  Procedure Laterality Date  . Colonoscopy, esophagogastroduodenoscopy (egd) and esophageal dilation N/A 10/13/2012    Procedure: colonoscopy and egd;  Surgeon: Wonda Horner, MD;  Location: [place];  Service: Endoscopy;  Laterality: N/A;   Family History  Problem Relation Age of Onset  . Cancer Mother     leukemia  . Hypertension Mother   . Diabetes Father   . Heart disease Father     passed away due to heart attack   History  Substance Use Topics  . Smoking status: Former Smoker -- 2.00 packs/day for 40 years    Types: Cigarettes    Quit date: 09/24/2012  . Smokeless tobacco: Never Used  . Alcohol Use: No   OB History   Grav Para Term Preterm Abortions TAB SAB Ect Mult Living   '2 2 2       2     '$ Review of Systems  All other systems reviewed and are negative.     Allergies  Omnipaque and Benzene  Home Medications   Prior to Admission medications   Medication Sig Start Date End Date Taking? Authorizing Chynah Orihuela  albuterol (PROVENTIL HFA;VENTOLIN HFA) 108 (90 BASE) MCG/ACT inhaler Inhale 2 puffs into the lungs 2 (two) times daily as needed for wheezing or  shortness of breath.    Yes Historical Miyana Mordecai, MD  aspirin EC 81 MG EC tablet Take 1 tablet (81 mg total) by mouth daily. 10/02/12  Yes Leone Haven, MD  atorvastatin (LIPITOR) 40 MG tablet Take 1 tablet (40 mg total) by mouth daily at 6 PM. 01/13/14  Yes Nobie Putnam, DO  furosemide (LASIX) 40 MG tablet Take 80 mg by mouth daily.  01/08/14  Yes Historical Armanie Martine, MD  HYDROcodone-acetaminophen (NORCO/VICODIN) 5-325 MG per tablet Take 2 tablets by mouth every 4 (four) hours as needed for moderate pain. 01/13/14  Yes Langston Masker, MD  isosorbide mononitrate (IMDUR) 30 MG 24 hr tablet Take 1 tablet (30 mg total) by mouth daily. 01/13/14  Yes Nobie Putnam, DO  letrozole [facility]) 2.5 MG tablet Take 1 tablet  (2.5 mg total) by mouth daily. 01/13/14  Yes Alexander Parks Ranger, DO  mirtazapine (REMERON) 15 MG tablet Take 1 tablet (15 mg total) by mouth at bedtime. 01/13/14  Yes Alexander Parks Ranger, DO  naproxen sodium (ANAPROX) 220 MG tablet Take 220 mg by mouth daily as needed (for pain).   Yes Historical Garth Diffley, MD  nitroGLYCERIN (NITROSTAT) 0.4 MG SL tablet Place 1 tablet (0.4 mg total) under the tongue every 5 (five) minutes as needed for chest pain. 03/17/13  Yes Cordelia Poche, MD  NON FORMULARY Place 3 L into the nose as needed (oxygen).    Yes Historical Bradyn Vassey, MD  palbociclib (IBRANCE) 125 MG capsule Take 125 mg by mouth daily with breakfast. Take whole with food.   Yes Historical Marybel Alcott, MD  potassium chloride SA (K-DUR,KLOR-CON) 20 MEQ tablet Take 1-2 tablets (20-40 mEq total) by mouth 2 (two) times daily. 2 tablets (40 mEq) AM and 1 tablet ( 20 mEq) PM 01/13/14  Yes Nobie Putnam, DO  Umeclidinium-Vilanterol 62.5-25 MCG/INH AEPB Inhale 1 puff into the lungs as needed (for COPD). 08/12/13  Yes Chesley Mires, MD  VITAMIN D, CHOLECALCIFEROL, PO Take 1,000 mg by mouth daily.    Yes Historical Lavante Toso, MD   BP 93/45  Pulse 88  Resp 16  SpO2 97% Physical Exam  Nursing note and vitals reviewed. Constitutional: No distress.  HENT:  Head: Normocephalic and atraumatic.  Right Ear: External ear normal.  Left Ear: External ear normal.  Eyes: Conjunctivae are normal. Right eye exhibits no discharge. Left eye exhibits no discharge. No scleral icterus.  Neck: Neck supple. No tracheal deviation present.  Cardiovascular: Normal rate, regular rhythm and intact distal pulses.   Pulmonary/Chest: Effort normal and breath sounds normal. No stridor. No respiratory distress. She has no wheezes. She has no rales.  Abdominal: Soft. Bowel sounds are normal. She exhibits no distension. There is no tenderness. There is no rebound and no guarding.  Musculoskeletal: She exhibits edema (mild). She exhibits  no tenderness.  No increased warmth, very mild erythema around the superficial wounds in the left lower extremity  Neurological: She is alert. She has normal strength. No cranial nerve deficit (no facial droop, extraocular movements intact, no slurred speech) or sensory deficit. She exhibits normal muscle tone. She displays no seizure activity. Coordination normal.  Skin: Skin is warm and dry. No rash noted. She is not diaphoretic.  Psychiatric: She has a normal mood and affect.    ED Course  Procedures (including critical care time) Labs Review Labs Reviewed  CBC - Abnormal; Notable for the following:    WBC 1.7 (*)    RBC 3.47 (*)    Hemoglobin 8.7 (*)    HCT  27.6 (*)    MCH 25.1 (*)    RDW 20.5 (*)    All other components within normal limits  COMPREHENSIVE METABOLIC PANEL - Abnormal; Notable for the following:    Potassium 3.4 (*)    Glucose, Bld 140 (*)    Creatinine, Ser 1.46 (*)    Albumin 3.3 (*)    Alkaline Phosphatase 132 (*)    GFR calc non Af Amer 36 (*)    GFR calc Af Amer 42 (*)    All other components within normal limits  Randolm Idol, ED    Imaging Review Dg Chest 2 View  02/15/2014   CLINICAL DATA:  Bilateral lower extremity swelling.  EXAM: CHEST  2 VIEW  COMPARISON:  February 03, 2014.  FINDINGS: The heart size and mediastinal contours are within normal limits. Both lungs are clear. No pneumothorax or pleural effusion is noted. Bone lucencies are again noted in the distal right clavicle and acromion consistent with a history of multiple myeloma.  IMPRESSION: No acute cardiopulmonary abnormality seen.   Electronically Signed   By: Sabino Dick M.D.   On: 02/15/2014 14:34     EKG Interpretation   Date/Time:  Monday February 15 2014 14:45:13 EDT Ventricular Rate:  73 PR Interval:  165 QRS Duration: 103 QT Interval:  417 QTC Calculation: 459 R Axis:   85 Text Interpretation:  Sinus rhythm No significant change since last  tracing Confirmed by KNAPP  MD-J,  JON ([id number]) on 02/15/2014 2:52:52 PM      MDM   Final diagnoses:  Peripheral edema    Multifactorial issues for her increasing weakness and swelling.  No over pulm edema.    IV lasix ordered.  Pt will be admitted by PCP.  May end up needing long term care.    Dorie Rank, MD 02/15/14 1626

## 2014-02-15 NOTE — Telephone Encounter (Signed)
Patient should take the prescribed torsemide.   If swelling and daily weights are increasing, she should notify cardiology and come in for visit.

## 2014-02-15 NOTE — Telephone Encounter (Signed)
New message      Feet and ankles are swollen and it is hard for the patient to walk.  Please advise

## 2014-02-15 NOTE — Telephone Encounter (Signed)
Spoke with the pt  She is c/o swelling in feet and ankles since 02/10/14 She is not having any increased SOB or any respiratory co's  She also let her let cards know about the swelling  I advised will send msg to VS to see if he has any recs or if she should wait for cards recs  Please advise thanks!

## 2014-02-15 NOTE — Telephone Encounter (Signed)
Will await input from cardiology.  If no changes from cardiology, then she would need ROV with pulmonary.

## 2014-02-15 NOTE — Telephone Encounter (Signed)
LVM for Anita [name] to call back, per her request about this situation.

## 2014-02-15 NOTE — Telephone Encounter (Signed)
Attempted to contact pt-she has called multiple MD office this AM and a home health nurse visit as well.  She is in the ED now.

## 2014-02-15 NOTE — ED Notes (Signed)
Pt arrived by [facility] from home with family with c/o increased bilateral leg swelling. Pt was discharged from hospital Sunday last week because of same symptoms. Hx of CHF, stated that the lasix she is on is not helping. Pt has O2 at home at HS and PRN. Pt denies any pain at this time. Per EMS lungs clear.  BP-164/98 HR-94 CBG-147 O2sat-96% 2lpm

## 2014-02-15 NOTE — ED Notes (Signed)
Pt is out of the department at this time 

## 2014-02-15 NOTE — Telephone Encounter (Signed)
Pt called because her feet a swollen and she can not walk. I asked her if she wanted a appointment to see someone and she said no she will see Dr. Lacinda Axon on Friday. But she would like someone to call her to discuss this. jw

## 2014-02-16 ENCOUNTER — Encounter: Payer: Medicare HMO | Admitting: Radiation Oncology

## 2014-02-16 ENCOUNTER — Telehealth: Payer: Self-pay | Admitting: Oncology

## 2014-02-16 ENCOUNTER — Ambulatory Visit: Admission: RE | Admit: 2014-02-16 | Payer: Medicare HMO | Source: Ambulatory Visit

## 2014-02-16 LAB — BASIC METABOLIC PANEL
BUN: 26 mg/dL — AB (ref 6–23)
CALCIUM: 8.7 mg/dL (ref 8.4–10.5)
CO2: 22 meq/L (ref 19–32)
Chloride: 103 mEq/L (ref 96–112)
Creatinine, Ser: 1.53 mg/dL — ABNORMAL HIGH (ref 0.50–1.10)
GFR calc Af Amer: 40 mL/min — ABNORMAL LOW (ref >90)
GFR, EST NON AFRICAN AMERICAN: 34 mL/min — AB (ref >90)
GLUCOSE: 137 mg/dL — AB (ref 70–99)
Potassium: 5.2 mEq/L (ref 3.7–5.3)
Sodium: 142 mEq/L (ref 137–147)

## 2014-02-16 LAB — CBC WITH DIFFERENTIAL/PLATELET
BASOS ABS: 0 10*3/uL (ref 0.0–0.1)
BASOS PCT: 2 % — AB (ref 0–1)
Eosinophils Absolute: 0 10*3/uL (ref 0.0–0.7)
Eosinophils Relative: 2 % (ref 0–5)
HEMATOCRIT: 27.3 % — AB (ref 36.0–46.0)
Hemoglobin: 8.9 g/dL — ABNORMAL LOW (ref 12.0–15.0)
LYMPHS ABS: 0.3 10*3/uL — AB (ref 0.7–4.0)
Lymphocytes Relative: 13 % (ref 12–46)
MCH: 25.9 pg — AB (ref 26.0–34.0)
MCHC: 32.6 g/dL (ref 30.0–36.0)
MCV: 79.4 fL (ref 78.0–100.0)
MONO ABS: 0.4 10*3/uL (ref 0.1–1.0)
Monocytes Relative: 20 % — ABNORMAL HIGH (ref 3–12)
NEUTROS ABS: 1.4 10*3/uL — AB (ref 1.7–7.7)
Neutrophils Relative %: 63 % (ref 43–77)
Platelets: 191 10*3/uL (ref 150–400)
RBC: 3.44 MIL/uL — ABNORMAL LOW (ref 3.87–5.11)
RDW: 21 % — AB (ref 11.5–15.5)
WBC: 2.1 10*3/uL — ABNORMAL LOW (ref 4.0–10.5)

## 2014-02-16 LAB — URINE MICROSCOPIC-ADD ON

## 2014-02-16 LAB — URINALYSIS, ROUTINE W REFLEX MICROSCOPIC
Bilirubin Urine: NEGATIVE
Glucose, UA: NEGATIVE mg/dL
KETONES UR: NEGATIVE mg/dL
NITRITE: POSITIVE — AB
PH: 6 (ref 5.0–8.0)
PROTEIN: NEGATIVE mg/dL
Specific Gravity, Urine: 1.014 (ref 1.005–1.030)
Urobilinogen, UA: 0.2 mg/dL (ref 0.0–1.0)

## 2014-02-16 LAB — GLUCOSE, CAPILLARY
GLUCOSE-CAPILLARY: 134 mg/dL — AB (ref 70–99)
GLUCOSE-CAPILLARY: 137 mg/dL — AB (ref 70–99)
Glucose-Capillary: 152 mg/dL — ABNORMAL HIGH (ref 70–99)
Glucose-Capillary: 117 mg/dL — ABNORMAL HIGH (ref 70–99)
Glucose-Capillary: 122 mg/dL — ABNORMAL HIGH (ref 70–99)

## 2014-02-16 LAB — TSH: TSH: 0.007 u[IU]/mL — AB (ref 0.350–4.500)

## 2014-02-16 MED ORDER — AMOXICILLIN-POT CLAVULANATE 500-125 MG PO TABS
1.0000 | ORAL_TABLET | Freq: Two times a day (BID) | ORAL | Status: DC
  Filled 2014-02-16 (×2): qty 1

## 2014-02-16 MED ORDER — FUROSEMIDE 40 MG PO TABS
40.0000 mg | ORAL_TABLET | Freq: Once | ORAL | Status: AC
  Administered 2014-02-16: 40 mg via ORAL
  Filled 2014-02-16: qty 1

## 2014-02-16 MED ORDER — DEXTROSE 5 % IV SOLN
2.0000 g | Freq: Two times a day (BID) | INTRAVENOUS | Status: DC
  Administered 2014-02-16 – 2014-02-17 (×4): 2 g via INTRAVENOUS
  Filled 2014-02-16 (×6): qty 2

## 2014-02-16 MED ORDER — CIPROFLOXACIN HCL 750 MG PO TABS: 750.0000 mg | ORAL_TABLET | Freq: Two times a day (BID) | ORAL | Status: DC

## 2014-02-16 NOTE — H&P (Signed)
FMTS Attending Admission Note: Annabell Sabal MD Personal pager:  [phone] FPTS Service Pager:  [phone]  I  have seen and examined this patient, reviewed their chart. I have discussed this patient with the resident. I agree with the resident's findings, assessment and care plan.  Additionally:  Anita [name] is a 68 yo F with Metastatic BRCA now on radiation, CAD, CHF, CKD Stage III, DM2, chronic respiratory failure on 3 L O2 at home who presents with difficulty ambulating x 2 days.  States in her usual state of health until Friday, noted increased swelling BL LE's.  Increased her lasix from 40 mg to 120 mg on her own but no response.  Difficulty ambulating Saturday and Sunday even with cane (usually walks with no assistive devices) and therefore came to ED.  Couldn't ambulate in ED and FPTS called for admission.  Overnight she had a temp to 100.5 and blood pressures dropped to 93T systolic.  Denied any symptoms during that time.  Feels well this AM.  No cough, chest pain, dysuria, hesitancy/frequency.  Does have some pain under pannus where skin breakdown has occurred.  Does also report decreased urination despite increasing her dose of Lasix, states only urinated 3 times in 24 hour period from Sunday to Monday.  Exam: Gen:  Elderly female, chronically ill appearing, wearing O2 cannula, sitting on side of bed Oropharynx:  Clear, dentition not great.  No mucosal sloughing or abscess or erythema.  MMM Heart:  RRR Lungs: Clear  Abdomen:  NT to palpation, benign. Ext:  +3 edema, mostly nonpitting except for deep pressure.  Chronic venous stasis changes noted Skin:  Skin breakdown and maceration beneath pannus.  Barrier cream in place.  Foul odor  Imp/Plan: 1.  Febrile neutropenia: - calling her this, despite fact we do not have temp >100.4 for 1 hour - blood cultures already obtained - UA and urine culture sent, especially with decreased urination symptoms - cipro/augmentin - call Onc for  recommendations for febrile neutorpenia.  Also, has radiation scheduled today.  Missed yesterday.  Need to call and let them know.  2.  BL LE edema: - check thyroid for myxedema.  Last TSH I see was 0.008 in 2014 with no follow-up - continue diuretics.  Watch creatinine - likely combination of venous stasis, CKD, CHF.  Last Echo 55%  3.  Humeral fracture: - scheduled to have splint removed today - Contact Ortho - Dr. Jackalyn Lombard office to have this removed  4.  CHF:   - Grade II diastolic dysfunction based on 1/30 Echo.  Last Echo as above - has appt with cardiology tomorrow - call to reschedule this  5.  Ambulation: - main reason for admission - PT to see and eval  Alveda Reasons, MD 02/16/2014 8:08 AM

## 2014-02-16 NOTE — Telephone Encounter (Signed)
Valentina Gu, RN for report on [name].  Misty said Tanecia is doing better.  The swelling in her legs is down and she was able to ambulate with OT today.  Asked if Taniesha wants to have treatment today.  She would have to come by [facility] and it may be an extra cost for the patient.  Misty said she would check with care management and call back.  Misty called back and said the teaching service doctor does not want Kalisha to come for treatment today.  Will forward to Dr. Pablo Ledger and notified Nira Conn, Radiation Therapist on [place] [address] that treatment is canceled for today.

## 2014-02-16 NOTE — Discharge Summary (Signed)
[facility] Discharge Summary  Patient name: Anita [name] Medical record number: [id number] Date of birth: Sep 25, 1945 Age: 68 y.o. Gender: female Date of Admission: 02/15/2014  Date of Discharge: 02/18/14 Admitting Physician: Alveda Reasons, MD  Primary Care Provider: [name], DO Consultants: Oncology, Palliative Care  Indication for Hospitalization: bilateral lower ext weakness and edema  Discharge Diagnoses/Problem List:  Bilateral lower ext weakness, edema, pain, secondary to multifactorial (bone mets, CHF, deconditioning) - Improved Chronic combined CHF, without evidence of exacerbation - Stable Breast Cancer, Stage-4, metastatic  H/o R-arm pathologic humerus fracture, due to bone metastasis, s/p repeat fall H/o subclinical hyperthyroidsim Anemia/Leukopenia, chronic 2/2 chemo, with brief low-grade fever without actual neutropenic fever - Improved Asymptomatic Bacteruria Hypokalemia - Resolved Rash, left lower abdominal skin fold, consistent with candida CKD-III CAD  COPD  T2DM  Chronic Pain  H/o poor appetite  Disposition: Home, with home health PT, RN  Discharge Condition: Stable  Discharge Exam: General: chronically ill-appearing, NAD  Cardiovascular: RRR. No m/r/g.  Respiratory: Mostly CTAB, stable bibasilar rales. Normal effort.  Abdomen: obese, soft, nontender, nondistended.  Extremities: Improved bilateral 1+ pitting LE edema noted to shin/mid calf. Mild erythema noted bilaterally at the ankles as well, likely secondary to venous stasis and chronic LE edema.  Skin: warm, dry. A few eschars noted on anterior ly LE's bilaterally. Left lower abdominal skin fold with erythematous macerated tissue  Neuro: No focal deficits on exam.  Brief Hospital Course:  Anita [name] is a 68 y.o. female who presented with bilateral leg weakness and edema. PMH is significant for metastatic breast cancer, sCHF/dCHF, chronic resp failure on 3L O2, DM2,  h/o NSTEMI s/p cath. Recent hospitalizations (5/17 to 5/20 and 6/10 to 6/16) for similar complaints (previously also with CHF exac). Patient had recently completed short term rehab at SNF about 1 month ago, since has been home with home health.  Presented to ED with inability to ambulate due to LE weakness, denied SOB / CP. Clinically appeared dry-euvolemic and LE edema near baseline. Work-up with CXR (negative for pulm edema), ProBNP mild elevation to 1201 (previous highs >3k to 20k), POCT-Trop negative, EKG stable, labs mostly unremarkable, chronic leukopenia (WBC 1.7) and anemia 8.7 noted.  On admission actually held home Lasix (seemed dry with mild low BP).  During hospitalization, demonstrated continued improvement within 24-48 hours, had resumed home Lasix PO (never received aggressive IV diuresis), improved with PT assistance, near baseline but still overall poor functional status. Of note, patient with low-grade fever < 100.4 o/n on admission, placed on Cefepime with concern of neutropenic fever. Remained afebrile during remainder of hospitalization, WBC improved to 3.6 / ANC improved 1.8, no other signs of infection. UA initially suggestive of infection (asymptomatic), Cefepime also cover UTI, and Urine Culture (multiple bacterial types), discontinued Cefepime after 48 hrs.  Of note, Oncology consulted (Dr. Juliann Mule) reviewed treatment plan with patient, agree to continue chemo (Ibrance and Letrozole, palliative radiation therapy on discharge. Palliative Care results were essentially remain Full Code, continue therapy, return home with home health. Patient discharged home in stable condition.  Issues for Follow Up:  1. CHF / Diuresis - Discharged on Lasix 68m PO daily (discontinued Torsemide). Wt on discharge 228 lbs (admit 231 lbs)  2. Ortho follow-up in 1 week to remove R-arm splint (pathologic frx)  3. Oncology - continue chemo (Ibrance, Letrozole) x 3 cycles (2 wks on, 1 wk off) (completed 1)  then re-stage cancer and discuss options. Continue palliative  radiation therapies (7 out of 10 completed)  4. Subclinical Hyperthyroidism - TSH 0.007, Free T4 (1.47), Free T3 4.4 (inc) - Unlikely candidate for further work-up or therapy  5. Continue to re-visit option for SNF, currently home health PT  Significant Procedures: none  Significant Labs and Imaging:   Recent Labs Lab 02/16/14 0441 02/17/14 0737 02/18/14 0517  WBC 2.1* 2.6* 3.1*  HGB 8.9* 8.8* 9.4*  HCT 27.3* 27.6* 29.9*  PLT 191 178 228    Recent Labs Lab 02/15/14 1350 02/16/14 0441 02/17/14 0737 02/18/14 0517  NA 142 142 140 138  K 3.4* 5.2 3.5* 3.6*  CL 102 103 101 99  CO2 _0 GLUCOSE 140* 137* 147* 148*  BUN 20 26* 24* 27*  CREATININE 1.46* 1.53* 1.35* 1.23*  CALCIUM 9.0 8.7 8.6 9.4  ALKPHOS 132*  --   --   --   AST 10  --   --   --   ALT 10  --   --   --   ALBUMIN 3.3*  --   --   --    TSH - 0.007  Free T3 4.4 (inc), Free T4 1.47 nml  Prealbumin - 12.9 (low)  Blood Culture (x 2, collected 6/22) >> NGTD  UA - positive nitrite, large leuk, mod hgb, WBC TNTC (similar to previously hospitalization)  Urine Culture (6/23) >> multiple bacterial types - colonization  Imaging/Diagnostic Tests:   6/22 CXR 2v  FINDINGS:  The heart size and mediastinal contours are within normal limits.  Both lungs are clear. No pneumothorax or pleural effusion is noted.  Bone lucencies are again noted in the distal right clavicle and  acromion consistent with a history of multiple myeloma.  IMPRESSION:  No acute cardiopulmonary abnormality seen.  Results/Tests Pending at Time of Discharge:   Blood Culture (x 2, collected 6/22) >> NGTD  Discharge Medications:    Medication List         albuterol 108 (90 BASE) MCG/ACT inhaler  Commonly known as:  PROVENTIL HFA;VENTOLIN HFA  Inhale 2 puffs into the lungs 2 (two) times daily as needed for wheezing or shortness of breath.     aspirin 81 MG EC tablet   Take 1 tablet (81 mg total) by mouth daily.     atorvastatin 40 MG tablet  Commonly known as:  LIPITOR  Take 1 tablet (40 mg total) by mouth daily at 6 PM.     furosemide 40 MG tablet  Commonly known as:  LASIX  Take 80 mg by mouth daily.     HYDROcodone-acetaminophen 5-325 MG per tablet  Commonly known as:  NORCO/VICODIN  Take 2 tablets by mouth every 4 (four) hours as needed for moderate pain.     IBRANCE 125 MG capsule  Generic drug:  palbociclib  Take 125 mg by mouth daily with breakfast. Take whole with food.     isosorbide mononitrate 30 MG 24 hr tablet  Commonly known as:  IMDUR  Take 1 tablet (30 mg total) by mouth daily.     letrozole 2.5 MG tablet  Commonly known as:  FEMARA  Take 1 tablet (2.5 mg total) by mouth daily.     mirtazapine 15 MG tablet  Commonly known as:  REMERON  Take 1 tablet (15 mg total) by mouth at bedtime.     naproxen sodium 220 MG tablet  Commonly known as:  ANAPROX  Take 220 mg by mouth daily as needed (for pain).  nitroGLYCERIN 0.4 MG SL tablet  Commonly known as:  NITROSTAT  Place 1 tablet (0.4 mg total) under the tongue every 5 (five) minutes as needed for chest pain.     NON FORMULARY  Place 3 L into the nose as needed (oxygen).     potassium chloride SA 20 MEQ tablet  Commonly known as:  K-DUR,KLOR-CON  Take 1-2 tablets (20-40 mEq total) by mouth 2 (two) times daily. 2 tablets (40 mEq) AM and 1 tablet ( 20 mEq) PM     Umeclidinium-Vilanterol 62.5-25 MCG/INH Aepb  Inhale 1 puff into the lungs as needed (for COPD).     VITAMIN D (CHOLECALCIFEROL) PO  Take 1,000 mg by mouth daily.        Discharge Instructions: Please refer to Patient Instructions section of EMR for full details.  Patient was counseled important signs and symptoms that should prompt return to medical care, changes in medications, dietary instructions, activity restrictions, and follow up appointments.   Follow-Up Appointments: Follow-up Information    Follow up with [name], DO On 02/19/2014. (at 1:45pm)    Specialty:  Family Medicine   Contact information:   [place] [address] [phone]       Follow up with GRAVES,JOHN L, MD. Schedule an appointment as soon as possible for a visit in 1 week. (For follow up of humerus fracture.)    Specialty:  Orthopedic Surgery   Contact information:   [place] [address] [phone]       Alexander Karamalegos, DO 02/19/2014, 6:06 AM PGY-1, [place]

## 2014-02-16 NOTE — Progress Notes (Signed)
INITIAL NUTRITION ASSESSMENT  DOCUMENTATION CODES Per approved criteria  -Morbid Obesity   INTERVENTION: Pt denies any oral nutrition supplements at this time. Currently eating well and likely meeting estimated needs. RD to continue to follow nutrition care plan.  NUTRITION DIAGNOSIS: Increased nutrient needs related to catabolic illness as evidenced by estimated needs.   Goal: Intake to meet >90% of estimated nutrition needs.  Monitor:  weight trends, lab trends, I/O's, PO intake, supplement tolerance  Reason for Assessment: MD Consult for Nutrition Assessment  68 y.o. female  Admitting Dx: metastatic breast CA  ASSESSMENT: PMHx significant for CAD (NSTEMI x 3), chronic combined CHF, CKD stage III (recent basline Creatinine ~ 1.3 - 1.5), DM2, COPD, chronic respiratory failure on home O2, and metastatic breast CA. Admitted with LE swelling, weakness and inability to walk. Work-up reveals underlying metastatic breast CA.  Chest xray is negative for pulmonary edema. MD suspects that fluid accumulation is 2/2 underlying breast CA rather than underlying HF.  Patient is currently undergoing palliative radiation. Palliative consult is pending.  Oncology consult pending.  Currently ordered for a Heart Healthy/CHO Modified Medium diet. Pt is eating 100% of her meals. Pt appears well-nourished with no significant fat/muscle mass loss.  Per chart review, pt has been seen by RD at [facility].  Height: Ht Readings from Last 1 Encounters:  02/16/14 5' 1.81" (1.57 m)    Weight: Wt Readings from Last 1 Encounters:  02/16/14 231 lb 8 oz (105.008 kg)   Ideal Body Weight: 105 lb  % Ideal Body Weight: 220%  Wt Readings from Last 25 Encounters:  02/16/14 231 lb 8 oz (105.008 kg)  02/09/14 234 lb 3.2 oz (106.232 kg)  02/07/14 228 lb 2.8 oz (103.5 kg)  02/03/14 234 lb 4.8 oz (106.278 kg)  02/02/14 235 lb 3.2 oz (106.686 kg)  01/20/14 231 lb 12.8 oz (105.144 kg)  01/10/14  234 lb (106.142 kg)  01/09/14 234 lb (106.142 kg)  01/03/14 234 lb (106.142 kg)  01/01/14 234 lb 8 oz (106.369 kg)  12/31/13 232 lb (105.235 kg)  12/26/13 232 lb (105.235 kg)  12/04/13 232 lb (105.235 kg)  11/06/13 239 lb (108.41 kg)  10/30/13 232 lb 4.8 oz (105.371 kg)  10/02/13 234 lb (106.142 kg)  08/26/13 235 lb 1.6 oz (106.641 kg)  08/24/13 234 lb 12.8 oz (106.505 kg)  08/13/13 233 lb (105.688 kg)  08/12/13 233 lb (105.688 kg)  08/11/13 234 lb 12.8 oz (106.505 kg)  07/17/13 237 lb 1.9 oz (107.557 kg)  07/09/13 239 lb 3.2 oz (108.5 kg)  07/09/13 239 lb 3.2 oz (108.5 kg)  05/22/13 233 lb (105.688 kg)    Usual Body Weight: 230 - 235 lb  % Usual Body Weight: 100%   BMI:  Body mass index is 42.6 kg/(m^2). Obese Class III  Estimated Nutritional Needs: Kcal: 2100 - 2300 Protein: 105 - 115 g Fluid: per team  Skin: open areas of moisture around groin and under panus  Diet Order:   Heart Healthy and CHO Modified  EDUCATION NEEDS: -No education needs identified at this time   Intake/Output Summary (Last 24 hours) at 02/16/14 1621 Last data filed at 02/16/14 1319  Gross per 24 hour  Intake    843 ml  Output      0 ml  Net    843 ml    Last BM: PTA  Labs:   Recent Labs Lab 02/15/14 1350 02/16/14 0441  NA 142 142  K 3.4* 5.2  CL 102 103  CO2 25 22  BUN 20 26*  CREATININE 1.46* 1.53*  CALCIUM 9.0 8.7  GLUCOSE 140* 137*    CBG (last 3)   Recent Labs  02/15/14 2033 02/16/14 0738 02/16/14 1140  GLUCAP 152* 134* 117*   No results found for this basename: prealbumin    Scheduled Meds: . aspirin EC  81 mg Oral Daily  . atorvastatin  40 mg Oral q1800  . ceFEPime (MAXIPIME) IV  2 g Intravenous Q12H  . heparin  5,000 Units Subcutaneous 3 times per day  . insulin aspart  0-9 Units Subcutaneous TID WC  . letrozole  2.5 mg Oral Daily  . mirtazapine  15 mg Oral QHS  . palbociclib  125 mg Oral Q breakfast  . sodium chloride  3 mL Intravenous Q12H  .  sodium chloride  3 mL Intravenous Q12H  . Umeclidinium-Vilanterol  1 puff Inhalation Daily    Continuous Infusions:   Past Medical History  Diagnosis Date  . Fibroid   . Morbid obesity   . 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 COPD (chronic obstructive pulmonary disease)   . Chronic diastolic CHF (congestive heart failure)   . NSTEMI (non-ST elevated myocardial infarction)     Cath November 2014 LAD 40% stenosis, first diagonal 80% stenosis, circumflex 20% stenosis, right coronary artery occluded. The EF was 35-40% at that time.  . Anemia     a. Adm 09/2012 with melena, Hgb 5.8 -> transfused. EGD/colonoscopy unrevealing.  . Breast cancer     a. Mets to bone. ER 100%/ PR 0%/Her2 neu negative.  [name] Ulcers of both lower legs   . Tobacco abuse   . Hyperlipidemia 02/11/2013  . Chronic respiratory failure     a. On O2 qhs. also portable O2.  . Chronic renal insufficiency   . Lesion of vocal cord     a. CT 05/2013 concerning for tumor.  . Shortness of breath   . Depression   . Diabetes mellitus without complication   . Anginal pain   . Dementia     very mild    Past Surgical History  Procedure Laterality Date  . Colonoscopy, esophagogastroduodenoscopy (egd) and esophageal dilation N/A 10/13/2012    Procedure: colonoscopy and egd;  Surgeon: Wonda Horner, MD;  Location: [facility];  Service: Endoscopy;  Laterality: N/A;    Inda Coke MS, RD, LDN Inpatient Registered Dietitian Pager: [phone] After-hours pager: [phone]

## 2014-02-16 NOTE — Evaluation (Signed)
Occupational Therapy Evaluation Patient Details Name: Anita [name] MRN: [id number] DOB: [birth year] Today's Date: 02/16/2014    History of Present Illness Anita [name] is a 68 y.o. female presenting with lower extremity weakness/pain and inability to ambulate. PMH is significant for CAD (NSTEMI x 3), chronic combined CHF, CKD stage 3 (recent basline Creatinine ~ 1.3 - 1.5), DM-2, COPD, Chronic respiratory failure on home O2, and Metastatic breast cancer to bone.   Clinical Impression   This 68 yo female admitted with above presents to acute OT with increased pain in right hand 5th digit at rest and left ankle with weight bearing, decreased balance, decreased bed mobility, RUE fx humerus, and obesity all affecting pt's ability to care for herself. She will benefit from acute OT with follow up at SNF.    Follow Up Recommendations  SNF    Equipment Recommendations  None recommended by OT       Precautions / Restrictions Precautions Precautions: Fall Precaution Comments: Rt distal humerus fx in sling--as suppose to follow up with Dr. Berenice Primas today (I called and let them know that pt is in the hospital and to see if Dr. Berenice Primas wanted to  see her while she was in here) Required Braces or Orthoses: Sling Restrictions Weight Bearing Restrictions: Yes RUE Weight Bearing: Non weight bearing Other Position/Activity Restrictions: RUE      Mobility Bed Mobility Overal bed mobility: Needs Assistance Bed Mobility: Supine to Sit     Supine to sit: Mod assist;HOB elevated        Transfers Overall transfer level: Needs assistance   Transfers: Sit to/from Stand Sit to Stand: Mod assist              Balance Overall balance assessment: Needs assistance Sitting-balance support: Feet supported;Single extremity supported Sitting balance-Leahy Scale: Fair     Standing balance support: Single extremity supported Standing balance-Leahy Scale: Poor                             ADL Overall ADL's : Needs assistance/impaired Eating/Feeding: Set up;Sitting   Grooming: Minimal assistance;Sitting   Upper Body Bathing: Moderate assistance;Sitting   Lower Body Bathing: Total assistance;Sit to/from stand   Upper Body Dressing : Moderate assistance;Sitting   Lower Body Dressing: Total assistance;Sit to/from stand   Toilet Transfer: Moderate assistance;Stand-pivot (bed>recliner going to pt's right)   Toileting- Clothing Manipulation and Hygiene: Total assistance;Sit to/from stand                         Pertinent Vitals/Pain Right 5th finger (at rest), left ankle with weight bearing (repositioned with weight off of it)     Hand Dominance Right   Extremity/Trunk Assessment Upper Extremity Assessment Upper Extremity Assessment: RUE deficits/detail RUE Deficits / Details: sugar tong cast from MCP to upper arm. no edema noted. RUE Coordination: decreased gross motor;decreased fine motor           Communication Communication Communication: No difficulties   Cognition Arousal/Alertness: Awake/alert Behavior During Therapy: WFL for tasks assessed/performed Overall Cognitive Status:  (PLOF from one week ago and now are totally different)                                [place] expects to be discharged to:: Private residence Living Arrangements: Spouse/significant other Available Help at Discharge: Family;Available PRN/intermittently Type of  Home: House Home Access: Stairs to enter [employer] of Steps: 4   Home Layout: One level     Bathroom Shower/Tub:  (sponge bath)   Bathroom Toilet: [place] - single point;Bedside commode (adjustable bed)          Prior Functioning/Environment Level of Independence: Needs assistance  Gait / Transfers Assistance Needed: Falls and difficulty with transfers since R humerus fx.  Uses cane.   ADL's / Homemaking Assistance Needed:  Pt states that she could completely dress herself pta (which is quite different than her report one week ago when she said that a nurse and her daughter A with all of her BADLs except she can get her underwear on bu tnot pulled up and she could dress her UB in a gown leavnig her RUE under the gown); husband performs all IADLs        OT Diagnosis: Generalized weakness;Cognitive deficits;Acute pain   OT Problem List: Decreased strength;Decreased range of motion;Decreased activity tolerance;Impaired balance (sitting and/or standing);Pain;Obesity;Decreased knowledge of use of DME or AE;Impaired UE functional use;Decreased cognition   OT Treatment/Interventions: Self-care/ADL training;Patient/family education;Balance training;Therapeutic exercise;Therapeutic activities;DME and/or AE instruction    OT Goals(Current goals can be found in the care plan section) Acute Rehab OT Goals Patient Stated Goal: wants to get home--however says she does have to be more mobile before she can go home. OT Goal Formulation: With patient Time For Goal Achievement: 03/02/14 Potential to Achieve Goals: Good  OT Frequency: Min 2X/week   Barriers to D/C: Decreased caregiver support             End of Session Nurse Communication: Mobility status (NT)  Activity Tolerance: Patient limited by pain (in left ankle only with weight bearing) Patient left: in chair;with call bell/phone within reach   Time: 0951-1018 OT Time Calculation (min): 27 min Charges:  OT General Charges $OT Visit: 1 Procedure OT Evaluation $Initial OT Evaluation Tier I: 1 Procedure OT Treatments $Self Care/Home Management : 8-22 mins  Almon Register [phone] 02/16/2014, 10:47 AM

## 2014-02-16 NOTE — Progress Notes (Signed)
Family Medicine Teaching Service Daily Progress Note Intern Pager: [phone]  Patient name: Anita [name] Medical record number: [id number] Date of birth: 1945/09/27 Age: 68 y.o. Gender: female  Primary Care Kathaleen Dudziak: [name], DO Consultants: Oncology Code Status: Full  Pt Overview and Major Events to Date:    Assessment and Plan: DARI CARPENITO is a 68 y.o. female presenting with lower extremity weakness/pain and inability to ambulate. PMH is significant for CAD (NSTEMI x 3), chronic combined CHF, CKD stage 3 (recent basline Creatinine ~ 1.3 - 1.5), DM-2, COPD, Chronic respiratory failure on home O2, and Metastatic breast cancer.  LE weakness / Inability to ambulate / LE edema - Patient is concerned that this is related to her underlying heart failure and feels like her lower extremity edema is worse and is interfering with ambulation. Clinically, patient appears to be at baseline regarding her CHF/LE edema (see below). This is likely secondary to her poor functional status and underlying metastatic breast cancer.  - Telemetry - PT eval to assess/aid ambulation and functional status  - Social Work consult regarding potential SNF placement  - Palliative care consult for continued GOC and potential hospice care - Check prealbumin - TSH, r/o hypothyroid  CHF, combined - Chest xray negative for pulmonary edema. LE edema appears at baseline (patient has chronic 1+ LE edema).  - No evidence of acute exacerbation.  - BP currently low, and patient appears at baseline. Therefore, will hold home lasix currently.  - Strict I/O's and Daily weights  - Resume home Lasix 80mg  PO daily  Metastatic Breast cancer - Patient is currently undergoing palliative radiation.  - Continuing home Ibrance and Letrozole  - Palliative medicine consult - Potentially concerned that home Letrozole may increase risk for bilateral peripheral edema. Discussed with pharmacy - Contacted Dr. Juliann Mule (Oncology) - plan to  see pt in hospital tomorrow 02/17/14 - Recommended to continue current medications, no specific changes at this time. Agree with checking albumin and nutrition consult  RUE pathologic fracture - Currently in splint - Contacted Dr. Berenice Primas' office, to have ortho tech remove splint  Anemia/leukopenia  - Secondary to underlying chronic illness and Ibrance.  - Will monitor closely during admission. CBC with Diff in the am.  Neutropenic fever - concern with low-grade fever < 100.4 briefly - started Cefepime IV - follow-up previously obtained cultures, blood, urine - Discussed with Oncology, to see in AM  CAD (NSTEMI x 3)  - Continue Aspirin and Lipitor   COPD, Chronic respiratory failure - Continuing home O2, Albuterol and Umeclidinium-Vilanterol  DM-2  - CBG's TID AC and QHS  - SSI - Sensitive  CKD III  - Creatinine is at baseline  - Will continue to monitor closely.  FEN/GI: SL IV; Heart Healthy/Carb modified diet  Prophylaxis: Heparin SQ  Disposition: SNF vs Hospice  Subjective:  Complains of bilateral LE edema, otherwise, denies CP or significant SOB. Feels weak, but not interested in going to SNF for rehab. Would like to return home.  Objective: Temp:  [99.5 F (37.5 C)-100.5 F (38.1 C)] 99.5 F (37.5 C) (06/23 0959) Pulse Rate:  [84-115] 115 (06/23 0959) Resp:  [16-22] 22 (06/23 0959) BP: (73-122)/(45-70) 115/45 mmHg (06/23 0959) SpO2:  [94 %-99 %] 95 % (06/23 0959) Weight:  [231 lb 8 oz (105.008 kg)] 231 lb 8 oz (105.008 kg) (06/23 0416) Physical Exam: General: chronically ill-appearing obese female lying in bed, nasal cannula oxygen in place, no apparent distress  Cardiovascular: RRR. No m/r/g.  Respiratory: Mostly CTAB except some bibasilar rales. Nml resp effort. Abdomen: obese, soft, nontender, nondistended.  Extremities: 1+ pitting LE edema noted to shin/mid calf. Mild erythema noted bilaterally at the ankles as well, likely secondary to venous stasis and  chronic LE edema.  Skin: warm, dry. A few eschars noted on anterior ly LE's bilaterally.  Neuro: No focal deficits on exam.  Laboratory:  Recent Labs Lab 02/15/14 1350 02/16/14 0441  WBC 1.7* 2.1*  HGB 8.7* 8.9*  HCT 27.6* 27.3*  PLT 189 191    Recent Labs Lab 02/15/14 1350 02/16/14 0441  NA 142 142  K 3.4* 5.2  CL 102 103  CO2 25 22  BUN 20 26*  CREATININE 1.46* 1.53*  CALCIUM 9.0 8.7  PROT 6.5  --   BILITOT 0.4  --   ALKPHOS 132*  --   ALT 10  --   AST 10  --   GLUCOSE 140* 137*   TSH - 0.007  Cultures - blood, urine  Imaging/Diagnostic Tests:  Dg Chest 2 View  02/15/2014 IMPRESSION: No acute cardiopulmonary abnormality seen.   Nobie Putnam, DO 02/16/2014, 3:34 PM PGY-1, [place] Intern pager: [phone], text pages welcome

## 2014-02-16 NOTE — Progress Notes (Signed)
FMTS ATTENDING  NOTE Ronette Deter  I have discussed this patient with the resident and the admitting attending Dr Mingo Amber. I agree with their findings, assessment and care plan.

## 2014-02-16 NOTE — Evaluation (Signed)
Physical Therapy Evaluation Patient Details Name: Anita [name] MRN: [id number] DOB: [birth year] Today's Date: 02/16/2014   History of Present Illness  Anita [name] is a 69 y.o. female presenting with lower extremity weakness/pain and inability to ambulate. PMH is significant for CAD (NSTEMI x 3), chronic combined CHF, CKD stage 3 (recent basline Creatinine ~ 1.3 - 1.5), DM-2, COPD, Chronic respiratory failure on home O2, and Metastatic breast cancer to bone.  Clinical Impression   Pt admitted with above, and recent hospitalizations. Pt currently with functional limitations due to the deficits listed below (see PT Problem List).  Pt will benefit from skilled PT to increase their independence and safety with mobility to allow discharge to the venue listed below.   Pain, in particular LLE pain is limiting her ability to amb, and she will have to be more mobile to be able to dc home;  Is it worth considering imaging her LLE?      Follow Up Recommendations SNF;Supervision/Assistance - 24 hour    Equipment Recommendations  Wheelchair (measurements PT)    Recommendations for Other Services       Precautions / Restrictions Precautions Precautions: Fall Precaution Comments: Rt distal humerus fx in sling--as suppose to follow up with Dr. Berenice Primas today (I called and let them know that pt is in the hospital and to see if Dr. Berenice Primas wanted to  see her while she was in here) per OT Required Braces or Orthoses: Sling Restrictions RUE Weight Bearing: Non weight bearing Other Position/Activity Restrictions: RUE      Mobility  Bed Mobility Overal bed mobility: Needs Assistance Bed Mobility: Sit to Supine       Sit to supine: Mod assist (Heavy mod assist)   General bed mobility comments: Required heavy mod assist to help LEs into bed, and assure NWBing RUE  Transfers Overall transfer level: Needs assistance Equipment used: Quad cane Transfers: Sit to/from [facility] Sit  to Stand: Mod assist Stand pivot transfers: Mod assist       General transfer comment: Mod assist for power up to stand from recliner; Difficulty with pivot steps back to bed secondary to decr WBing LLE with inability to step RLE  Ambulation/Gait                Stairs            Wheelchair Mobility    Modified Rankin (Stroke Patients Only)       Balance     Sitting balance-Leahy Scale: Fair       Standing balance-Leahy Scale: Poor                               Pertinent Vitals/Pain Did not rate pain, but it is clearly limiting her ability to walk patient repositioned for comfort     Home Living Family/patient expects to be discharged to:: Private residence Living Arrangements: Spouse/significant other Available Help at Discharge: Family;Available PRN/intermittently Type of Home: House Home Access: Stairs to enter Entrance Stairs-Rails: Right;Left;Can reach both Entrance Stairs-Number of Steps: 4 Home Layout: One level Home Equipment: Cane - single point;Bedside commode (adjustable bed)      Prior Function Level of Independence: Needs assistance   Gait / Transfers Assistance Needed: Falls and difficulty with transfers since R humerus fx.  Uses cane.    ADL's / Homemaking Assistance Needed: The following is per OT: Pt states that she could completely dress herself pta (which  is quite different than her report one week ago when she said that a nurse and her daughter A with all of her BADLs except she can get her underwear on bu tnot pulled up and she could dress her UB in a gown leavnig her RUE under the gown); husband performs all IADLs        Hand Dominance   Dominant Hand: Right    Extremity/Trunk Assessment   Upper Extremity Assessment: Defer to OT evaluation RUE Deficits / Details: sugar tong cast from MCP to upper arm. no edema noted.         Lower Extremity Assessment: Generalized weakness;LLE deficits/detail   LLE  Deficits / Details: Reports pain L lateral thigh and ankle, limiting her ability to WB; AROM is grossly WFL     Communication   Communication: No difficulties  Cognition Arousal/Alertness: Awake/alert Behavior During Therapy: WFL for tasks assessed/performed Overall Cognitive Status: Within Functional Limits for tasks assessed                      General Comments      Exercises        Assessment/Plan    PT Assessment Patient needs continued PT services  PT Diagnosis Difficulty walking;Generalized weakness;Acute pain   PT Problem List Decreased strength;Decreased activity tolerance;Decreased balance;Decreased mobility;Decreased knowledge of use of DME;Pain  PT Treatment Interventions DME instruction;Gait training;Functional mobility training;Stair training;Therapeutic activities;Therapeutic exercise;Balance training;Patient/family education   PT Goals (Current goals can be found in the Care Plan section) Acute Rehab PT Goals Patient Stated Goal: wants to get home--however says she does have to be more mobile before she can go home. PT Goal Formulation: With patient Time For Goal Achievement: 03/02/14 Potential to Achieve Goals: Fair    Frequency Min 3X/week   Barriers to discharge Decreased caregiver support      Co-evaluation               End of Session Equipment Utilized During Treatment: Gait belt Activity Tolerance: Patient limited by pain Patient left: in bed;with call bell/phone within reach Nurse Communication: Mobility status         Time: 1610-9604 PT Time Calculation (min): 15 min   Charges:   PT Evaluation $Initial PT Evaluation Tier I: 1 Procedure PT Treatments $Therapeutic Activity: 8-22 mins   PT G Codes:          Quin Hoop 02/16/2014, 2:50 PM Roney Marion, [place] Pager [phone] Office [phone]

## 2014-02-16 NOTE — Progress Notes (Signed)
ANTIBIOTIC CONSULT NOTE - INITIAL  Pharmacy Consult for Cefepime Indication: febrile neutropenia  Allergies  Allergen Reactions  . Omnipaque [Iohexol]     Pt claims she developed hives after given contrast  . Benzene Rash    Patient Measurements: Height: 5' 1.81" (157 cm) Weight: 231 lb 8 oz (105.008 kg) IBW/kg (Calculated) : 49.67  Vital Signs: Temp: 99.5 F (37.5 C) (06/23 0959) Temp src: Oral (06/23 0959) BP: 115/45 mmHg (06/23 0959) Pulse Rate: 115 (06/23 0959) Intake/Output from previous day: 06/22 0701 - 06/23 0700 In: 240 [P.O.:240] Out: -  Intake/Output from this shift: Total I/O In: 3 [I.V.:3] Out: -   Labs:  Recent Labs  02/15/14 1350 02/16/14 0441  WBC 1.7* 2.1*  HGB 8.7* 8.9*  PLT 189 191  CREATININE 1.46* 1.53*   Estimated Creatinine Clearance: 41 ml/min (by C-G formula based on Cr of 1.53).    Microbiology: Recent Results (from the past 720 hour(s))  URINE CULTURE     Status: None   Collection Time    02/03/14  3:59 PM      Result Value Ref Range Status   Specimen Description URINE, CATHETERIZED   Final   Special Requests NONE   Final   Culture  Setup Time     Final   Value: 02/03/2014 22:37     Performed at [facility]     Final   Value: >=100,000 COLONIES/ML     Performed at [facility]   Culture     Final   Value: STAPHYLOCOCCUS SPECIES (COAGULASE NEGATIVE)     Note: RIFAMPIN AND GENTAMICIN SHOULD NOT BE USED AS SINGLE DRUGS FOR TREATMENT OF STAPH INFECTIONS.     Performed at [facility]   Report Status 02/06/2014 FINAL   Final   Organism ID, Bacteria STAPHYLOCOCCUS SPECIES (COAGULASE NEGATIVE)   Final  MRSA PCR SCREENING     Status: None   Collection Time    02/04/14  2:17 AM      Result Value Ref Range Status   MRSA by PCR NEGATIVE  NEGATIVE Final   Comment:            The GeneXpert MRSA Assay (FDA     approved for NASAL specimens     only), is one component of a     comprehensive  MRSA colonization     surveillance program. It is not     intended to diagnose MRSA     infection nor to guide or     monitor treatment for     MRSA infections.    Medical History: Past Medical History  Diagnosis Date  . Fibroid   . Morbid obesity   . 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 COPD (chronic obstructive pulmonary disease)   . Chronic diastolic CHF (congestive heart failure)   . NSTEMI (non-ST elevated myocardial infarction)     Cath November 2014 LAD 40% stenosis, first diagonal 80% stenosis, circumflex 20% stenosis, right coronary artery occluded. The EF was 35-40% at that time.  . Anemia     a. Adm 09/2012 with melena, Hgb 5.8 -> transfused. EGD/colonoscopy unrevealing.  . Breast cancer     a. Mets to bone. ER 100%/ PR 0%/Her2 neu negative.  [name] Ulcers of both lower legs   . Tobacco abuse   . Hyperlipidemia 02/11/2013  . Chronic respiratory failure     a. On  O2 qhs. also portable O2.  . Chronic renal insufficiency   . Lesion of vocal cord     a. CT 05/2013 concerning for tumor.  . Shortness of breath   . Depression   . Diabetes mellitus without complication   . Anginal pain   . Dementia     very mild    Medications:  Scheduled:  . aspirin EC  81 mg Oral Daily  . atorvastatin  40 mg Oral q1800  . furosemide  40 mg Oral Once  . heparin  5,000 Units Subcutaneous 3 times per day  . insulin aspart  0-9 Units Subcutaneous TID WC  . letrozole  2.5 mg Oral Daily  . mirtazapine  15 mg Oral QHS  . palbociclib  125 mg Oral Q breakfast  . sodium chloride  3 mL Intravenous Q12H  . sodium chloride  3 mL Intravenous Q12H  . Umeclidinium-Vilanterol  1 puff Inhalation Daily   Assessment: 68 yo F with hx metastatic breast cancer currently undergoing palliative radiation.  Pt presented 6/22 with LE weakness, pain, and inability to ambulate.  Overnight pt had a temp of 100.5 and an episode of hypotension.  To start empiric coverage with  Cefepime for febrile neutropenia.  Goal of Therapy:  Renal dose adjustment of medications  Plan:  Cefepime 2gm IV q12h Follow-up clinical course, culture data, and renal function  [employer], Pharm.D., BCPS Clinical Pharmacist Pager [phone] 02/16/2014 10:32 AM

## 2014-02-17 ENCOUNTER — Encounter: Payer: Self-pay | Admitting: Clinical

## 2014-02-17 ENCOUNTER — Other Ambulatory Visit: Payer: Medicare HMO

## 2014-02-17 ENCOUNTER — Telehealth: Payer: Self-pay | Admitting: Oncology

## 2014-02-17 ENCOUNTER — Ambulatory Visit: Payer: Medicare HMO

## 2014-02-17 DIAGNOSIS — C50919 Malignant neoplasm of unspecified site of unspecified female breast: Secondary | ICD-10-CM

## 2014-02-17 DIAGNOSIS — G893 Neoplasm related pain (acute) (chronic): Secondary | ICD-10-CM

## 2014-02-17 DIAGNOSIS — C7951 Secondary malignant neoplasm of bone: Principal | ICD-10-CM

## 2014-02-17 DIAGNOSIS — R5383 Other fatigue: Secondary | ICD-10-CM

## 2014-02-17 DIAGNOSIS — D72819 Decreased white blood cell count, unspecified: Secondary | ICD-10-CM

## 2014-02-17 DIAGNOSIS — C7952 Secondary malignant neoplasm of bone marrow: Principal | ICD-10-CM

## 2014-02-17 DIAGNOSIS — R609 Edema, unspecified: Secondary | ICD-10-CM

## 2014-02-17 DIAGNOSIS — R5381 Other malaise: Secondary | ICD-10-CM

## 2014-02-17 LAB — CBC WITH DIFFERENTIAL/PLATELET
BASOS ABS: 0 10*3/uL (ref 0.0–0.1)
BASOS PCT: 0 % (ref 0–1)
EOS PCT: 2 % (ref 0–5)
Eosinophils Absolute: 0.1 10*3/uL (ref 0.0–0.7)
HEMATOCRIT: 27.6 % — AB (ref 36.0–46.0)
HEMOGLOBIN: 8.8 g/dL — AB (ref 12.0–15.0)
Lymphocytes Relative: 15 % (ref 12–46)
Lymphs Abs: 0.4 10*3/uL — ABNORMAL LOW (ref 0.7–4.0)
MCH: 25.6 pg — AB (ref 26.0–34.0)
MCHC: 31.9 g/dL (ref 30.0–36.0)
MCV: 80.2 fL (ref 78.0–100.0)
MONO ABS: 0.5 10*3/uL (ref 0.1–1.0)
Monocytes Relative: 20 % — ABNORMAL HIGH (ref 3–12)
Neutro Abs: 1.6 10*3/uL — ABNORMAL LOW (ref 1.7–7.7)
Neutrophils Relative %: 63 % (ref 43–77)
Platelets: 178 10*3/uL (ref 150–400)
RBC: 3.44 MIL/uL — ABNORMAL LOW (ref 3.87–5.11)
RDW: 21 % — AB (ref 11.5–15.5)
WBC: 2.6 10*3/uL — ABNORMAL LOW (ref 4.0–10.5)

## 2014-02-17 LAB — BASIC METABOLIC PANEL
BUN: 24 mg/dL — AB (ref 6–23)
CHLORIDE: 101 meq/L (ref 96–112)
CO2: 23 mEq/L (ref 19–32)
Calcium: 8.6 mg/dL (ref 8.4–10.5)
Creatinine, Ser: 1.35 mg/dL — ABNORMAL HIGH (ref 0.50–1.10)
GFR calc non Af Amer: 40 mL/min — ABNORMAL LOW (ref >90)
GFR, EST AFRICAN AMERICAN: 46 mL/min — AB (ref >90)
Glucose, Bld: 147 mg/dL — ABNORMAL HIGH (ref 70–99)
POTASSIUM: 3.5 meq/L — AB (ref 3.7–5.3)
Sodium: 140 mEq/L (ref 137–147)

## 2014-02-17 LAB — GLUCOSE, CAPILLARY
GLUCOSE-CAPILLARY: 106 mg/dL — AB (ref 70–99)
Glucose-Capillary: 158 mg/dL — ABNORMAL HIGH (ref 70–99)
Glucose-Capillary: 118 mg/dL — ABNORMAL HIGH (ref 70–99)
Glucose-Capillary: 160 mg/dL — ABNORMAL HIGH (ref 70–99)

## 2014-02-17 LAB — PREALBUMIN: Prealbumin: 12.9 mg/dL — ABNORMAL LOW (ref 17.0–34.0)

## 2014-02-17 LAB — T3, FREE: T3, Free: 4.4 pg/mL — ABNORMAL HIGH (ref 2.3–4.2)

## 2014-02-17 LAB — T4, FREE: Free T4: 1.47 ng/dL (ref 0.80–1.80)

## 2014-02-17 MED ORDER — MICONAZOLE NITRATE POWD
Freq: Two times a day (BID) | Status: DC
  Administered 2014-02-17 – 2014-02-18 (×2): via TOPICAL

## 2014-02-17 NOTE — Progress Notes (Signed)
FMTS ATTENDING  NOTE Kehinde Eniola,MD I  have seen and examined this patient, reviewed their chart. I have discussed this patient with the resident. I agree with the resident's findings, assessment and care plan. 

## 2014-02-17 NOTE — Telephone Encounter (Signed)
Notified by Jeannetta Nap, Radiation Therapist, that Ms. Virden will be canceled today due to the machine being down.  Notified Frances Maywood, RN, Ms. Maestas's nurse.

## 2014-02-17 NOTE — Progress Notes (Signed)
I have seen Ms. Arrick and have spoken with her about meeting with palliative. She is open to a discussion. She would like to speak with [place] privately and not have family involved at this time but is open to discussion with family as well as necessary. I will see her 6/24 ~1100. Thank you for this consult.  Vinie Sill, NP Palliative Medicine Team Pager # [phone] (M-F 8a-5p) Team Phone # [phone] (Nights/Weekends)

## 2014-02-17 NOTE — Progress Notes (Signed)
Family Medicine CSW contacted unit RNCM to discuss pt disposition. CSW informed RNCM that CSW could assist with the [place] application if pt decides to return home. RNCM to notify CSW with dc plans once finalized.  Hunt Oris, MSW, [place]

## 2014-02-17 NOTE — Progress Notes (Signed)
Family Medicine Teaching Service Daily Progress Note Intern Pager: [phone]  Patient name: Anita [name] Medical record number: [id number] Date of birth: 11/26/1945 Age: 68 y.o. Gender: female  Primary Care Provider: [name], DO Consultants: Oncology Code Status: Full  Pt Overview and Major Events to Date:  6/22: Admitted, stable resp status / worsening b/l LE edema, PT/OT, palliative 6/23: low-grade fever 100.73F o/n, leukopenic 2.1, Cefepime IV>>, Cr 1.53, Prealbumin 12.9 6/24: afebrile, WBC 2.6, ANC 1.6, Cr 1.35  Assessment and Plan: Anita [name] is a 68 y.o. female presenting with lower extremity weakness/pain and inability to ambulate. PMH is significant for CAD (NSTEMI x 3), chronic combined CHF, CKD stage 3 (recent basline Creatinine ~ 1.3 - 1.5), DM-2, COPD, Chronic respiratory failure on home O2, and Metastatic breast cancer.  # LE weakness / edema - Improved - Suspected multifactorial etiology metastatic CA to bone, chronic venous, deconditioning with poor functional status, in setting of CHF, however unlikely to be entirely acute CHF exacerbation. Clinically, patient appears to be near baseline regarding her volume status. - Telemetry - PT/OT >> recommend SNF (patient currently declining) - Social Work consult regarding potential SNF placement  # H/o subclinical hyperthyroidism - Concern with malignancy and mets, may be related to thyroid etiology, otherwise no prior h/o thyroid disease - TSH 0.007 (low), check Free T3, Free T4, however previous labs similar level, considered subclinical - given pts current clinical status with poor prognosis, unlikely candidate for further work-up (uptake scan), consider PO therapy  # CHF, combined - On admission, CXR negative for pulmonary edema. LE edema has improved towards baseline - No evidence of acute CHF exacerbation. Clinically dry/euvolemic. Improved bilateral edema - Currently at wt 228 lbs (similar to previous discharge dry  wt) - No O2 requirement, >98% on RA - Strict I/O's and Daily weights, no UOP charted. - Reduced Lasix to $Remove'40mg'YadYjMr$  daily  # Metastatic Breast cancer - Followed by Oncology (Dr. Juliann Mule) and currently undergoing palliative radiation.  - Continuing home Ibrance and Letrozole  - Potentially concerned that home Letrozole may increase risk for bilateral peripheral edema. Discussed with pharmacy - Contacted Dr. Juliann Mule (Oncology) - plan to see pt in hospital today 02/17/14 - Recommended to continue current medications, no specific changes at this time - Palliative medicine consult - re: continued discussion of goals of care / hospice?      - scheduled [place] meeting (private with pt only) at 1100 (6/24) - Appreciate recommendations on continuing Palliative Radiation therapy, especially while inpatient at this time  # RUE pathologic fracture - Contacted Dr. Berenice Primas' office, to have ortho tech remove splint  # Anemia/leukopenia  - Secondary to underlying chronic illness and Ibrance.  - Will monitor closely during admission  # Neutropenic fever - Concern with mild elevated temp to 100.5 briefly on 6/22 - Remains afebrile since. - Improved WBC 2.6 (near baseline), ANC 1.6 - Continue Cefepime IV (6/23>>), plan to continue x 48 hrs, likely discontinue 6/25 - follow-up previously obtained cultures, blood, urine - Discussed with Oncology, to see in AM  # Presumed UTI vs colonization - UA grossly positive for infection, +nitrite, large leuks, WBC TNTC, actually similar to previous admission - Urine Culture pending (last admission 6/10 urine grew coag neg staph, contaminate) - on Cefepime for neutropenic fever  # CAD (NSTEMI x 3)  - Continue Aspirin and Lipitor   # COPD, Chronic respiratory failure - Continuing home O2, Albuterol and Umeclidinium-Vilanterol  # DM-2  - CBG's TID AC  and QHS  - SSI - Sensitive  # CKD III  - Creatinine is at baseline (1.4-1.5) - Will continue to monitor closely.  #  Increased nutritional needs d/t chronic illness - Followed by Nutrition - Patient declined any oral nutritional supplements - Continues with good PO intake  FEN/GI: SL IV; Heart Healthy/Carb modified diet  Prophylaxis: Heparin SQ  Disposition: Admitted for LE weakness, edema, dyspnea. Improved, no further IV diuresis. PT recommend SNF, patient refusing, to be seen by Oncology and Palliative today. Disposition pending.  Subjective:  Feels better today. Improved bilateral LE edema. Denies CP, SOB, cough, fever/chills, abd pain, or dysuria. Not interested in going to SNF. Planning to talk to Palliative today.  Objective: Temp:  [99.3 F (37.4 C)-99.9 F (37.7 C)] 99.9 F (37.7 C) (06/24 0422) Pulse Rate:  [94-115] 94 (06/24 0422) Resp:  [20-22] 20 (06/24 0422) BP: (109-136)/(45-71) 136/58 mmHg (06/24 0422) SpO2:  [91 %-99 %] 99 % (06/24 0422) Weight:  [228 lb 4.8 oz (103.556 kg)-231 lb 8 oz (105.008 kg)] 228 lb 4.8 oz (103.556 kg) (06/24 0500) Physical Exam: General: chronically ill-appearing, sitting up in chair at bedside, NAD  Cardiovascular: RRR. No m/r/g.  Respiratory: Mostly CTAB, stable bibasilar rales. Normal effort. Abdomen: obese, soft, nontender, nondistended.  Extremities: Stable/mildly improved bilateral 1+ pitting LE edema noted to shin/mid calf. Mild erythema noted bilaterally at the ankles as well, likely secondary to venous stasis and chronic LE edema.  Skin: warm, dry. A few eschars noted on anterior ly LE's bilaterally.  Neuro: No focal deficits on exam.  Laboratory:  Recent Labs Lab 02/15/14 1350 02/16/14 0441 02/17/14 0737  WBC 1.7* 2.1* 2.6*  HGB 8.7* 8.9* 8.8*  HCT 27.6* 27.3* 27.6*  PLT 189 191 178    Recent Labs Lab 02/15/14 1350 02/16/14 0441 02/17/14 0737  NA 142 142 140  K 3.4* 5.2 3.5*  CL 102 103 101  CO2 $Re'25 22 23  'osU$ BUN 20 26* 24*  CREATININE 1.46* 1.53* 1.35*  CALCIUM 9.0 8.7 8.6  PROT 6.5  --   --   BILITOT 0.4  --   --   ALKPHOS  132*  --   --   ALT 10  --   --   AST 10  --   --   GLUCOSE 140* 137* 147*   TSH - 0.007 Prealbumin - 12.9 (low)  Blood Culture (x 2, collected 6/22) >> pending  UA - positive nitrite, large leuk, mod hgb, WBC TNTC (similar to previously hospitalization) Urine Culture (6/23) >> PENDING  Imaging/Diagnostic Tests:  6/22 CXR 2v FINDINGS:  The heart size and mediastinal contours are within normal limits.  Both lungs are clear. No pneumothorax or pleural effusion is noted.  Bone lucencies are again noted in the distal right clavicle and  acromion consistent with a history of multiple myeloma.  IMPRESSION:  No acute cardiopulmonary abnormality seen.  Nobie Putnam, DO 02/17/2014, 9:32 AM PGY-1, [place] Intern pager: [phone], text pages welcome

## 2014-02-17 NOTE — Telephone Encounter (Signed)
Called Eilleen's nurse and asked if Lance Morin could be treatment today.  Her nurse is going to check with her doctor to see if she can come for treatment today and call back.

## 2014-02-17 NOTE — Progress Notes (Signed)
Spoke with Gaspar Skeeters Orthopedic PA informed me that the patient should not have the splint removed due to the severity of her humerus fracture. He said instead patient should be seen at the orthopedic office a week after discharge for further assessment.   MD Parks Ranger was notified of Gaspar Skeeters orthopedic PA suggestion.

## 2014-02-17 NOTE — Progress Notes (Signed)
Received referral for [place] [facility] services on behalf of [place] program. Will engage if appropriate pending the results of the palliative care meeting.  Marthenia Rolling, MSN- [facility] [phone]

## 2014-02-17 NOTE — Progress Notes (Signed)
Anita [name]   DOB:[birth year]   SW#:[id number]   GYK#:[id number]  Subjective: I spoke with the patient in the presence of Elmo Putt of Palliative care.  She reports continued weakness with difficulty ambulating greater than 20 feet due to both weakness and pain.    Objective:  Filed Vitals:   02/17/14 0956  BP: 144/87  Pulse: 90  Temp: 98.1 F (36.7 C)  Resp: 20    Body mass index is 42.01 kg/(m^2).  Intake/Output Summary (Last 24 hours) at 02/17/14 1232 Last data filed at 02/17/14 0800  Gross per 24 hour  Intake   1490 ml  Output      0 ml  Net   1490 ml    Sitting in chair. R arm bandaged.   Sclerae unicteric  Oropharynx clear  No peripheral adenopathy  Lungs diminished at the bases  Heart regular rate and rhythm  Abdomen benign  MSK 1+ peripheral edema  Neuro nonfocal   CBG (last 3)   Recent Labs  02/16/14 1649 02/16/14 2031 02/17/14 0739  GLUCAP 122* 137* 158*     Labs:  Lab Results  Component Value Date   WBC 2.6* 02/17/2014   HGB 8.8* 02/17/2014   HCT 27.6* 02/17/2014   MCV 80.2 02/17/2014   PLT 178 02/17/2014   NEUTROABS 1.6* 7/93/9030   Basic Metabolic Panel:  Recent Labs Lab 02/15/14 1350 02/16/14 0441 02/17/14 0737  NA 142 142 140  K 3.4* 5.2 3.5*  CL 102 103 101  CO2 _0 GLUCOSE 140* 137* 147*  BUN 20 26* 24*  CREATININE 1.46* 1.53* 1.35*  CALCIUM 9.0 8.7 8.6   GFR Estimated Creatinine Clearance: 46.1 ml/min (by C-G formula based on Cr of 1.35). Liver Function Tests:  Recent Labs Lab 02/15/14 1350  AST 10  ALT 10  ALKPHOS 132*  BILITOT 0.4  PROT 6.5  ALBUMIN 3.3*    CBC:  Recent Labs Lab 02/15/14 1350 02/16/14 0441 02/17/14 0737  WBC 1.7* 2.1* 2.6*  NEUTROABS  --  1.4* 1.6*  HGB 8.7* 8.9* 8.8*  HCT 27.6* 27.3* 27.6*  MCV 79.5 79.4 80.2  PLT 189 191 178   Cardiac Enzymes: No results found for this basename: CKTOTAL, CKMB, CKMBINDEX, TROPONINI,  in the last 168 hours BNP: No components found with this basename:  POCBNP,  CBG:  Recent Labs Lab 02/16/14 0738 02/16/14 1140 02/16/14 1649 02/16/14 2031 02/17/14 0739  GLUCAP 134* 117* 122* 137* 158*   Thyroid function studies  Recent Labs  02/16/14 0921  TSH 0.007*   Anemia work up No results found for this basename: VITAMINB12, FOLATE, FERRITIN, TIBC, IRON, RETICCTPCT,  in the last 72 hours Microbiology Recent Results (from the past 240 hour(s))  CULTURE, BLOOD (ROUTINE X 2)     Status: None   Collection Time    02/15/14  9:20 PM      Result Value Ref Range Status   Specimen Description BLOOD LEFT HAND   Final   Special Requests BOTTLES DRAWN AEROBIC ONLY 5CC   Final   Culture  Setup Time     Final   Value: 02/16/2014 01:47     Performed at [facility]   Culture     Final   Value:        BLOOD CULTURE RECEIVED NO GROWTH TO DATE CULTURE WILL BE HELD FOR 5 DAYS BEFORE ISSUING A FINAL NEGATIVE REPORT     Performed at [facility]   Report Status PENDING  Incomplete  CULTURE, BLOOD (ROUTINE X 2)     Status: None   Collection Time    02/15/14  9:30 PM      Result Value Ref Range Status   Specimen Description BLOOD LEFT HAND   Final   Special Requests BOTTLES DRAWN AEROBIC ONLY 3CC   Final   Culture  Setup Time     Final   Value: 02/16/2014 01:47     Performed at [facility]   Culture     Final   Value:        BLOOD CULTURE RECEIVED NO GROWTH TO DATE CULTURE WILL BE HELD FOR 5 DAYS BEFORE ISSUING A FINAL NEGATIVE REPORT     Performed at [facility]   Report Status PENDING   Incomplete  URINE CULTURE     Status: None   Collection Time    02/16/14 12:48 PM      Result Value Ref Range Status   Specimen Description URINE, CLEAN CATCH   Final   Special Requests NONE   Final   Culture  Setup Time     Final   Value: 02/16/2014 12:48     Performed at [place] PENDING   Incomplete   Culture     Final   Value: Culture reincubated for better growth     Performed at [facility]   Report Status PENDING   Incomplete      Studies:  Dg Chest 2 View  02/15/2014   CLINICAL DATA:  Bilateral lower extremity swelling.  EXAM: CHEST  2 VIEW  COMPARISON:  February 03, 2014.  FINDINGS: The heart size and mediastinal contours are within normal limits. Both lungs are clear. No pneumothorax or pleural effusion is noted. Bone lucencies are again noted in the distal right clavicle and acromion consistent with a history of multiple myeloma.  IMPRESSION: No acute cardiopulmonary abnormality seen.   Electronically Signed   By: Sabino Dick M.D.   On: 02/15/2014 14:34    Assessment/Plan: 68 y.o.  1. Lower extremity weakness, edema, likely multifactorial.  --We discussed that her causes of weakness includes deconditioning due to chronic illnesses and multiple hospitalizations, poor nutrition, fatigue secondary XRT, chronic diseases including COPD, CHF.  We will continue to treat the underlying causes.  Physical therapy as tolerated.   2. Bone pain.  --Secondary to bone metastases.  Completed 7 out of 10 XRT fractions (goal 30 Gy).  Started XRT to left/right femur on 02/02/2014 per Dr. Sondra Come.   3. History right distal oblique fracture (12/31/2013).  --Secondary to number #3. We started zometa q 4 weeks (first dose on 01/01/2014) to reduce further skeletal events. It was held due to elevation in her creatinine.   4. Metastatic breast cancer (ER pos/PR pos/ HER2 neg) with spread to bone, lungs.  --We added palbociclib 125 mg daily to her letrozole.  She completed 21 days and now is on her 7 day off period.  She reports being scheduled to start palbociclib tomorrow.  Based on her leukopenia, we will likely change to 2 weeks on followed by one week off.  We will repeat her CA.29 next visit.  --We will plan on re-staging her following 3 cycles of therapy.   5. Stage III (T1N1M0) Laryngeal Carcinoma, probable.  --She has been seen by radiation oncology. In the presence of Nurse  Gerald Dexter, we reviewed her CT of neck as noted above and she agreed to further evaluation  by Dr. Sondra Come. She declined intervention presently. She is being followed by Dr. Sondra Come.   6. Leukopenia/Anemia of chronic disease plus/minus iron defiency.  -- Past work up for reversible causes of anemia were negative. MCV of 78.1 concerning for mild iron defiency. Patient cannot tolerate iron.   7. Grade 2 dystolic dysfunction.  --She is euvolemic on exam. She is on Lasix, nitrogylerin prn, losartan. Increase lasix prn and weight daily.  8. Diabetes mellitus, type II. Diet controlled.  9. COPD: On albuterol prn. She remains on home oxygen.  10. Chronic renal insufficiency. Creatinine clearance of 46.1 mL/min today.  11. Disposition: Follow up in about 1 week with CBC, CMP, CA 27 29 in my office.  CBC every 2 weeks for the first couple months on palbociclib.  12. Low TSH.   TSH, 0.007.  Add free T4.   CHISM, DAVID, MD 02/17/2014  12:32 PM

## 2014-02-17 NOTE — Progress Notes (Signed)
Full note to follow:  I have spoken with [name] this morning for goals of care. Dr. Juliann Mule also came at beginning of conversation and I appreciate his input as well. Dr Juliann Mule has added to her cancer treatment regimen and is hoping to reassess the effects of this therapy after ~2 months and they are hopeful to slow progression of her disease. [name] says she understands that her cancer is incurable and that the goals of therapy are to slow progression if possible. She tells me that she understands this will eventually take her life and this will likely be sooner than she had hoped. We did discuss progression of her cancer and that at some point she will continue to have progressive weakness/fatigue/pain. We discussed that she will have to listen to her body to know that things are not working. Her main goal is to be able to walk the distance of the hospital room from bed to door. She is concerned about not being able to ambulate and says this is due to both weakness and pain. I am sure her edema has increased her pain and made ambulation more difficult. She has had one dose of biphosphonates (to help with pain from bone mets) but was unable to complete subsequent doses due to her kidney function. She has also completed 7/10 radiation treatments and is anxious to continue and complete these treatments as soon as she is able in hopes of helping with pain. I recommend for her to take her Vicodin prior to activity/PT to see if this will help with her tolerance of activity and ambulation. She says she has no pain until she walks so we will try the premedication with Vicodin and go from there (she may need something more long acting such as an oxycontin to help with this). She is also adament about going home and not to rehab (I even explained that she would get more PT at rehab and increase her chances of improving ambulation). She says that she has a Living Will and that she would want to be full code but says she  has instructed her HCPOA dtr [name] to let her go if "there is no hope." She also says she would not want long term ventilation. I will continue to follow and support [name] and her family while she is here in the hospital.   Vinie Sill, NP Palliative Medicine Team Pager # [phone] (M-F 8a-5p) Team Phone # [phone] (Nights/Weekends)

## 2014-02-17 NOTE — Progress Notes (Signed)
Patient's [name]) was taken to the pharmacy for dispensing with patient's permission.

## 2014-02-18 ENCOUNTER — Telehealth: Payer: Self-pay | Admitting: Oncology

## 2014-02-18 ENCOUNTER — Ambulatory Visit: Payer: Medicare HMO | Admitting: Cardiology

## 2014-02-18 ENCOUNTER — Ambulatory Visit: Payer: Medicare HMO

## 2014-02-18 ENCOUNTER — Telehealth: Payer: Self-pay | Admitting: Internal Medicine

## 2014-02-18 LAB — URINE CULTURE: Colony Count: >=100000

## 2014-02-18 LAB — BASIC METABOLIC PANEL
BUN: 27 mg/dL — ABNORMAL HIGH (ref 6–23)
CO2: 23 meq/L (ref 19–32)
CREATININE: 1.23 mg/dL — AB (ref 0.50–1.10)
Calcium: 9.4 mg/dL (ref 8.4–10.5)
Chloride: 99 mEq/L (ref 96–112)
GFR calc Af Amer: 52 mL/min — ABNORMAL LOW (ref >90)
GFR calc non Af Amer: 45 mL/min — ABNORMAL LOW (ref >90)
GLUCOSE: 148 mg/dL — AB (ref 70–99)
Potassium: 3.6 mEq/L — ABNORMAL LOW (ref 3.7–5.3)
Sodium: 138 mEq/L (ref 137–147)

## 2014-02-18 LAB — GLUCOSE, CAPILLARY
Glucose-Capillary: 152 mg/dL — ABNORMAL HIGH (ref 70–99)
Glucose-Capillary: 165 mg/dL — ABNORMAL HIGH (ref 70–99)
Glucose-Capillary: 139 mg/dL — ABNORMAL HIGH (ref 70–99)

## 2014-02-18 LAB — CBC WITH DIFFERENTIAL/PLATELET
Basophils Absolute: 0 10*3/uL (ref 0.0–0.1)
Basophils Relative: 1 % (ref 0–1)
EOS PCT: 2 % (ref 0–5)
Eosinophils Absolute: 0.1 10*3/uL (ref 0.0–0.7)
HCT: 29.9 % — ABNORMAL LOW (ref 36.0–46.0)
Hemoglobin: 9.4 g/dL — ABNORMAL LOW (ref 12.0–15.0)
LYMPHS ABS: 0.5 10*3/uL — AB (ref 0.7–4.0)
LYMPHS PCT: 17 % (ref 12–46)
MCH: 25.4 pg — AB (ref 26.0–34.0)
MCHC: 31.4 g/dL (ref 30.0–36.0)
MCV: 80.8 fL (ref 78.0–100.0)
MONO ABS: 0.7 10*3/uL (ref 0.1–1.0)
Monocytes Relative: 22 % — ABNORMAL HIGH (ref 3–12)
Neutro Abs: 1.8 10*3/uL (ref 1.7–7.7)
Neutrophils Relative %: 58 % (ref 43–77)
Platelets: 228 10*3/uL (ref 150–400)
RBC: 3.7 MIL/uL — AB (ref 3.87–5.11)
RDW: 20.7 % — ABNORMAL HIGH (ref 11.5–15.5)
WBC: 3.1 10*3/uL — ABNORMAL LOW (ref 4.0–10.5)

## 2014-02-18 MED ORDER — TORSEMIDE 20 MG PO TABS: 40.0000 mg | ORAL_TABLET | Freq: Every day | ORAL | Status: DC

## 2014-02-18 MED ORDER — BARRIER CREAM NON-SPECIFIED
1.0000 "application " | TOPICAL_CREAM | Freq: Two times a day (BID) | TOPICAL | Status: DC | PRN
  Filled 2014-02-18: qty 1

## 2014-02-18 MED ORDER — FUROSEMIDE 80 MG PO TABS
80.0000 mg | ORAL_TABLET | Freq: Every day | ORAL | Status: DC
  Filled 2014-02-18: qty 1

## 2014-02-18 NOTE — Progress Notes (Signed)
Family Medicine Teaching Service Daily Progress Note Intern Pager: [phone]  Patient name: Anita [name] Medical record number: [id number] Date of birth: 1946/08/20 Age: 68 y.o. Gender: female  Primary Care Provider: [name], DO Consultants: Oncology Code Status: Full  Pt Overview and Major Events to Date:  6/22: Admitted, stable resp status / worsening b/l LE edema, PT/OT, palliative 6/23: low-grade fever 100.5F o/n, leukopenic 2.1, Cefepime IV>>, Cr 1.53, Prealbumin 12.9 6/24: afebrile, WBC 2.6, ANC 1.6, Cr 1.35 6/25: afebrile, WBC inc 3.1, ANC 1.8 (improved), Cr 1.23, Free T4 1.47 / T3 4.4 (inc)  Assessment and Plan: Anita [name] is a 68 y.o. female presenting with lower extremity weakness/pain and inability to ambulate. PMH is significant for CAD (NSTEMI x 3), chronic combined CHF, CKD stage 3 (recent basline Creatinine ~ 1.3 - 1.5), DM-2, COPD, Chronic respiratory failure on home O2, and Metastatic breast cancer.  # LE weakness / edema - Improved - Suspected multifactorial etiology metastatic CA to bone, chronic venous, deconditioning with poor functional status, in setting of CHF, however unlikely to be entirely acute CHF exacerbation. Clinically, patient appears to be near baseline regarding her volume status. - Telemetry - PT/OT >> recommend SNF (patient refuses) plan to return home to resume [facility] PT  # H/o subclinical hyperthyroidism - Concern with malignancy and mets, may be related to thyroid etiology, otherwise no prior h/o thyroid disease - TSH 0.007 (low), considered subclinical - Free T4 1.47 / T3 4.4 (inc) - Follow-up outpatient, given pts current clinical status with poor prognosis, unlikely candidate for further work-up (uptake scan), consider PO therapy  # Chronic CHF, combined, without evidence of exacerbation - On admission, CXR negative for pulmonary edema. LE edema has improved towards baseline - No evidence of acute CHF exacerbation. Clinically dry/euvolemic.  Improved bilateral edema - Currently at wt 228 lbs (similar to previous discharge dry wt) - No O2 requirement, >98% on RA - Strict I/O's and Daily weights, no UOP charted. - Wt stable 228 lbs - Resume home Lasix 31m PO daily  # Metastatic Breast cancer - Followed by Oncology (Dr. CJuliann Mule and currently undergoing palliative radiation.  - Continuing home Ibrance and Letrozole  - Potentially concerned that home Letrozole may increase risk for bilateral peripheral edema. Discussed with pharmacy - Contacted Dr. CJuliann Mule(Oncology) - plan to see pt in hospital today 02/17/14 - Recommended to continue current medications, no specific changes at this time - Palliative medicine consult - re: continued discussion of goals of care / hospice?       - GOCs: Home with PT, denies SNF, Full Code, discussed chronic state of advanced disease with poor prognosis, patient and Oncology discuss plan to continue treatment x 2 months (complete chemo and radiation) re-assess status - Follow-up with Oncology outpatient, continue Palbociclib, Letrozole, radiation  # RUE pathologic fracture - Ortho recommend leaving splint in place, follow-up outpatient in 1 week for likely removal  # Anemia/leukopenia - Improved - Secondary to underlying chronic illness and Ibrance.  - Improved, stable  # Neutropenic fever - Resolved - Concern with mild elevated temp to 100.5 briefly on 6/22 - Remains afebrile since. - Improved WBC 3.1 (near baseline), ANC 1.8 - Discontinued Cefepime IV (6/23>>6/24) - follow-up previously obtained cultures, blood, urine  # Presumed urinary colonization - UA grossly positive for infection, +nitrite, large leuks, WBC TNTC, actually similar to previous admission - Urine Culture multiple bacterial types, (last admission 6/10 urine grew coag neg staph, contaminate) - DC'd Cefepime  #  CAD (NSTEMI x 3)  - Continue Aspirin and Lipitor   # COPD, Chronic respiratory failure - Continuing home O2,  Albuterol and Umeclidinium-Vilanterol  # DM-2  - CBG's TID AC and QHS  - SSI - Sensitive  # CKD III  - Creatinine is at baseline (1.4-1.5) - Improved 1.35 >> 1.23  # Increased nutritional needs d/t chronic illness - Followed by Nutrition - Patient declined any oral nutritional supplements - Continues with good PO intake  # Rash, left lower abdominal skin fold - erythematous with skin breakdown and maceration, appears consistent with candidal infection - started Miconazole powder and barrier cream  FEN/GI: SL IV; Heart Healthy/Carb modified diet  Prophylaxis: Heparin SQ  Disposition: Admitted for LE weakness, edema, dyspnea. Improved, no further IV diuresis. PT recommend SNF, patient refusing. Plan for return home with [facility] PT, RN. Oncology / Palliative discussed plans for continuing treatment outpatient. Anticipate discharge today vs possible tomorrow when pt and family ready for pt to return and resume care.  Subjective:  Feels about the same, no significant change. Persistent complaint of pain in bilateral LE (improved since admission). Denies CP, SOB, cough, fever/chills, abd pain, or dysuria. Wishes to return home, continue treatment, and PT.  Objective: Temp:  [98.1 F (36.7 C)-99 F (37.2 C)] 98.5 F (36.9 C) (06/25 0615) Pulse Rate:  [80-91] 91 (06/25 0615) Resp:  [20] 20 (06/25 0615) BP: (110-144)/(51-87) 120/65 mmHg (06/25 0615) SpO2:  [99 %-100 %] 100 % (06/25 0615) Weight:  [228 lb 4.8 oz (103.556 kg)] 228 lb 4.8 oz (103.556 kg) (06/24 2053) Physical Exam: General: chronically ill-appearing, NAD Cardiovascular: RRR. No m/r/g.  Respiratory: Mostly CTAB, stable bibasilar rales. Normal effort. Abdomen: obese, soft, nontender, nondistended.  Extremities: Improved bilateral 1+ pitting LE edema noted to shin/mid calf. Mild erythema noted bilaterally at the ankles as well, likely secondary to venous stasis and chronic LE edema.  Skin: warm, dry. A few eschars noted on  anterior ly LE's bilaterally. Left lower abdominal skin fold with erythematous macerated tissue Neuro: No focal deficits on exam.  Laboratory:  Recent Labs Lab 02/16/14 0441 02/17/14 0737 02/18/14 0517  WBC 2.1* 2.6* 3.1*  HGB 8.9* 8.8* 9.4*  HCT 27.3* 27.6* 29.9*  PLT 191 178 228    Recent Labs Lab 02/15/14 1350 02/16/14 0441 02/17/14 0737 02/18/14 0517  NA 142 142 140 138  K 3.4* 5.2 3.5* 3.6*  CL 102 103 101 99  CO2 _0 BUN 20 26* 24* 27*  CREATININE 1.46* 1.53* 1.35* 1.23*  CALCIUM 9.0 8.7 8.6 9.4  PROT 6.5  --   --   --   BILITOT 0.4  --   --   --   ALKPHOS 132*  --   --   --   ALT 10  --   --   --   AST 10  --   --   --   GLUCOSE 140* 137* 147* 148*   TSH - 0.007 Free T3 4.4 (inc), Free T4 1.47 nml Prealbumin - 12.9 (low)  Blood Culture (x 2, collected 6/22) >> NGTD  UA - positive nitrite, large leuk, mod hgb, WBC TNTC (similar to previously hospitalization) Urine Culture (6/23) >> multiple bacterial types - colonization  Imaging/Diagnostic Tests:  6/22 CXR 2v FINDINGS:  The heart size and mediastinal contours are within normal limits.  Both lungs are clear. No pneumothorax or pleural effusion is noted.  Bone lucencies are again noted in the distal right clavicle  and  acromion consistent with a history of multiple myeloma.  IMPRESSION:  No acute cardiopulmonary abnormality seen.  Nobie Putnam, DO 02/18/2014, 8:27 AM PGY-1, [place] Intern pager: [phone], text pages welcome

## 2014-02-18 NOTE — Care Management Note (Signed)
CARE MANAGEMENT NOTE 02/18/2014  Patient:  Anita [name], Anita [name]   Account Number:  [id number]  Date Initiated:  02/18/2014  Documentation initiated by:  ROYAL,CHERYL  Subjective/Objective Assessment:   CM referral for [facility] needs.     Action/Plan:   following for progression and d/c planning. Pt is active with [facility] for [facility] services and wishes to continue with that agency.   Anticipated DC Date:  02/19/2014   Anticipated DC Plan:  [place]         Choice offered to / List presented to:             [facility] agency  [facility]   Status of service:   Medicare Important Message given?   (If response is "NO", the following Medicare IM given date fields will be blank) Date Medicare IM given:   Date Additional Medicare IM given:    Discharge Disposition:    Per UR Regulation:    If discussed at Long Length of Stay Meetings, dates discussed:    Comments:

## 2014-02-18 NOTE — Telephone Encounter (Signed)
pt called and wants to r/s lab missed on 6/24, pt is inpatient @ [facility], informed nurse

## 2014-02-18 NOTE — Progress Notes (Signed)
FMTS ATTENDING  NOTE Kehinde Eniola,MD I  have seen and examined this patient, reviewed their chart. I have discussed this patient with the resident. I agree with the resident's findings, assessment and care plan. 

## 2014-02-18 NOTE — Telephone Encounter (Signed)
**Note De-Identified  Obfuscation** Received a call from [place], [place].  He said that Anita [name] did not want to come for Radiation today due to the cost of Care link.  She is planning to be discharged tomorrow.

## 2014-02-18 NOTE — Progress Notes (Signed)
Physical Therapy Treatment Patient Details Name: Anita [name] MRN: [id number] DOB: 1945/10/02 Today's Date: 02/18/2014    History of Present Illness Anita [name] is a 68 y.o. female presenting with lower extremity weakness/pain and inability to ambulate. PMH is significant for CAD (NSTEMI x 3), chronic combined CHF, CKD stage 3 (recent basline Creatinine ~ 1.3 - 1.5), DM-2, COPD, Chronic respiratory failure on home O2, and Metastatic breast cancer to bone.    PT Comments    I had a lengthy conversation with Anita [name] regarding her available assist  in the home.  She reports her daughter in law comes as pt's husband leaves for work at 5:30am and stays with her during the day until husband comes home from work.  She reports daughter also comes in the evenings to assist.  She has [facility] home care and I suggest HHPT to resume following this hospitalization.  I continue to recommend SNF for her post acute recovery but she continues to decline this option and insists on going home.  She says family is capable and willing to assist her at her current level of function.  I suspect she will largely be at the transfers and wheelchair level in the home.  It would be advisable for family to consider having a w/c ramp built or rented. I recommend ambulance transport home as pt.is only walking a distance of 3 pivoting steps and cannot negotiate the 3 steps to get into her home.  She is agreeable with this recommendation.   Follow Up Recommendations  SNF;Supervision/Assistance - 24 hour     Equipment Recommendations  Wheelchair (measurements PT)    Recommendations for Other Services       Precautions / Restrictions Precautions Precautions: Fall Precaution Comments: R distal humerus fx, in sling and reportedly NWB Required Braces or Orthoses: Sling Restrictions Weight Bearing Restrictions: Yes RUE Weight Bearing: Non weight bearing    Mobility  Bed Mobility Overal bed mobility: Needs  Assistance Bed Mobility: Sit to Supine     Supine to sit: Mod assist;HOB elevated     General bed mobility comments: again requiring heavy mod assist for lifting LEs up into the bed  Transfers Overall transfer level: Needs assistance Equipment used: Quad cane Transfers: Sit to/from Stand Sit to Stand: Mod assist         General transfer comment: mod assist for sit to stand and stand to sit along with use of quad cane in left hand for support once standing  Ambulation/Gait Ambulation/Gait assistance: Mod assist Ambulation Distance (Feet): 3 Feet Assistive device: Quad cane Gait Pattern/deviations: Step-to pattern Gait velocity: decreased   General Gait Details: Pt. needed mod assist to stabilize while taking 3 pivoting steps to bed from recliner chair   Stairs            Wheelchair Mobility    Modified Rankin (Stroke Patients Only)       Balance           Standing balance support: Single extremity supported Standing balance-Leahy Scale: Poor                      Cognition Arousal/Alertness: Awake/alert Behavior During Therapy: WFL for tasks assessed/performed Overall Cognitive Status: Within Functional Limits for tasks assessed                      Exercises      General Comments        Pertinent Vitals/Pain See  vitals tab Pt. With O2 sats on RA at 92% following pivot steps to bed.  O2 reapplied once pt. In bed.     Home Living                      Prior Function            PT Goals (current goals can now be found in the care plan section) Progress towards PT goals: Progressing toward goals    Frequency  Min 3X/week    PT Plan Current plan remains appropriate    Co-evaluation             End of Session Equipment Utilized During Treatment: Gait belt Activity Tolerance: Patient limited by pain Patient left: in bed;with call bell/phone within reach;with bed alarm set     Time: 1354-1420 PT Time  Calculation (min): 26 min  Charges:  $Gait Training: 8-22 mins $Therapeutic Activity: 8-22 mins                    G Codes:      Ladona Ridgel 02/18/2014, 2:36 PM Gerlean Ren PT [facility] [place] [phone]

## 2014-02-18 NOTE — Discharge Instructions (Signed)
You were hospitalized with increased leg swelling and some shortness of breath. Overall, you were not fluid overloaded, and responded well within 24 hours of home medical therapy. - As we discussed we believe that your leg pain and swelling is a chronic problem that will not get significantly better - Please continue to take all of your home medications, including Lasix 80mg  daily - Follow-up with Dr. Lacinda Axon tomorrow - Continue to follow-up with Dr. Juliann Mule and continue your chemotherapy, and Radiation therapy, you will need to call to re-schedule these appointments - We still strongly believe that you will benefit from higher level care and urge you to discuss returning to a [place] if you have further difficulties requiring you to stay in the hospital  If you have significant worsening shortness of breath, chest pain, or swelling, please call our clinic and try to get seen, otherwise please seek immediate medical attention in the Emergency Department

## 2014-02-18 NOTE — Progress Notes (Signed)
Patient discharge teaching given, including activity, diet, follow-up appoints, and medications. Patient verbalized understanding of all discharge instructions. IV access was d/c'd. Vitals are stable. Skin is intact except as charted in most recent assessments. Pt. To be taken home via ambulance.Jillyn Ledger, MBA, BS, RN

## 2014-02-18 NOTE — Progress Notes (Signed)
Following up on COPD GOLD referral. Met with patient at bedside to explain and offer [facility] services. Confirms she plans on going home with husband with family support. Reports she is active with [facility] home health services as well. Explained that [facility] will not interfere or replace home health. She will receive post hospital discharge call and will be evaluated for monthly home visits. Consents were obtained and contact information confirmed. Will make inpatient RNCM aware that [facility] will follow. THN packet left at bedside.  Anita Rolling, MSN- [facility] [phone]

## 2014-02-19 ENCOUNTER — Ambulatory Visit: Payer: Medicare HMO

## 2014-02-19 ENCOUNTER — Telehealth: Payer: Self-pay | Admitting: Family Medicine

## 2014-02-19 ENCOUNTER — Telehealth: Payer: Self-pay | Admitting: Oncology

## 2014-02-19 ENCOUNTER — Encounter: Payer: Self-pay | Admitting: Family Medicine

## 2014-02-19 ENCOUNTER — Ambulatory Visit (INDEPENDENT_AMBULATORY_CARE_PROVIDER_SITE_OTHER): Payer: Medicare HMO | Admitting: Family Medicine

## 2014-02-19 VITALS — BP 109/55 | HR 97 | Temp 98.5°F | Ht 62.0 in

## 2014-02-19 DIAGNOSIS — C7952 Secondary malignant neoplasm of bone marrow: Secondary | ICD-10-CM

## 2014-02-19 DIAGNOSIS — C7951 Secondary malignant neoplasm of bone: Secondary | ICD-10-CM

## 2014-02-19 DIAGNOSIS — I509 Heart failure, unspecified: Secondary | ICD-10-CM

## 2014-02-19 DIAGNOSIS — L538 Other specified erythematous conditions: Secondary | ICD-10-CM

## 2014-02-19 DIAGNOSIS — C50919 Malignant neoplasm of unspecified site of unspecified female breast: Secondary | ICD-10-CM

## 2014-02-19 DIAGNOSIS — L304 Erythema intertrigo: Secondary | ICD-10-CM | POA: Insufficient documentation

## 2014-02-19 DIAGNOSIS — I5042 Chronic combined systolic (congestive) and diastolic (congestive) heart failure: Secondary | ICD-10-CM

## 2014-02-19 MED ORDER — CLOTRIMAZOLE 1 % EX CREA: 1.0000 "application " | TOPICAL_CREAM | Freq: Two times a day (BID) | CUTANEOUS | Status: DC

## 2014-02-19 MED ORDER — FLUCONAZOLE 150 MG PO TABS: 150.0000 mg | ORAL_TABLET | Freq: Once | ORAL | Status: DC

## 2014-02-19 NOTE — Discharge Summary (Signed)
FMTS ATTENDING  NOTE Kehinde Eniola,MD I  have seen and examined this patient, reviewed their chart. I have discussed this patient with the resident. I agree with the resident's findings, assessment and care plan. 

## 2014-02-19 NOTE — Progress Notes (Signed)
   Subjective:    Patient ID: Anita [name], female    DOB: Aug 06, 1946, 68 y.o.   MRN: [id number]  HPI [name] presents to the clinic today for hospital follow up.  She was just discharged on 6/25 following an admission for LE pain and inability to ambulate. Today, she reports that she is doing alright.  She denies any current SOB, chest pain.  She still reports some difficulty with ambulation, secondary to pain (from metastasis of breast cancer).    Current issues:  1) Metastatic breast cancer - Patient currently undergoing palliative radiation. - She is followed by Onc, Dr. Juliann Mule - She is currently on Ibrance and Letrozole. - She is still having significant LE pain secondary to Mets  2) Chronic CHF - Currently stable. - No chest pain; SOB at baseline - LE edema is improved per her report.  3) Intertrigo - Patient reports "yeast" under her abdominal skin fold - She reports that it has become red and has even started to bleed - She has been using antifungal powder without resolution  Review of Systems Per HPI    Objective:   Physical Exam Filed Vitals:   02/19/14 1344  BP: 109/55  Pulse: 97  Temp: 98.5 F (36.9 C)   Exam: General: chronically ill appearing, NAD.  Cardiovascular: RRR. No murmurs, rubs, or gallops. Respiratory: CTAB. No rales, rhonchi, or wheeze. Abdomen: soft, nontender, nondistended. Extremities: chronic LE edema; 1+ with overlying erythema.  Skin: erythema and irritation noted under left side of pannus.  No purulence or discharge noted. Minimal bleeding noted.    Assessment & Plan:  See Problem List

## 2014-02-19 NOTE — Assessment & Plan Note (Signed)
Discussed poor prognosis in depth with patient today.  Patient still resistant to palliative/hospice care currently. Patient to continue palliative radiation and letrozole/ibrance.

## 2014-02-19 NOTE — Assessment & Plan Note (Signed)
Treating with PO Diflucan and topical Clotrimazole.

## 2014-02-19 NOTE — Patient Instructions (Signed)
It was nice seeing you today.  Continue your current medications.  I have prescribed two meds for your yeast - please take them as prescribed.  Follow up in ~ 1 month.

## 2014-02-19 NOTE — Telephone Encounter (Signed)
Anita [name] has been discharged from [facility].  Called her at home to see if she is coming for radiation today.  She said that she is not coming today and will restart radiation on Monday.  Notified [place] [facility].

## 2014-02-19 NOTE — Assessment & Plan Note (Signed)
Stable. Continue current lasix regimen.

## 2014-02-22 ENCOUNTER — Ambulatory Visit
Admission: RE | Admit: 2014-02-22 | Discharge: 2014-02-22 | Disposition: A | Discharge status: Home / Self Care | Payer: Medicare HMO | Source: Ambulatory Visit | Attending: Radiation Oncology | Admitting: Radiation Oncology

## 2014-02-22 ENCOUNTER — Encounter: Payer: Self-pay | Admitting: Clinical

## 2014-02-22 ENCOUNTER — Ambulatory Visit: Payer: Medicare HMO

## 2014-02-22 DIAGNOSIS — I509 Heart failure, unspecified: Secondary | ICD-10-CM | POA: Diagnosis not present

## 2014-02-22 DIAGNOSIS — Z510 Encounter for antineoplastic radiation therapy: Secondary | ICD-10-CM | POA: Diagnosis present

## 2014-02-22 DIAGNOSIS — R609 Edema, unspecified: Secondary | ICD-10-CM | POA: Diagnosis not present

## 2014-02-22 DIAGNOSIS — Z7982 Long term (current) use of aspirin: Secondary | ICD-10-CM | POA: Diagnosis not present

## 2014-02-22 DIAGNOSIS — C7951 Secondary malignant neoplasm of bone: Secondary | ICD-10-CM | POA: Diagnosis not present

## 2014-02-22 DIAGNOSIS — M84429A Pathological fracture, unspecified humerus, initial encounter for fracture: Secondary | ICD-10-CM | POA: Diagnosis not present

## 2014-02-22 DIAGNOSIS — G893 Neoplasm related pain (acute) (chronic): Secondary | ICD-10-CM | POA: Diagnosis not present

## 2014-02-22 DIAGNOSIS — C50919 Malignant neoplasm of unspecified site of unspecified female breast: Secondary | ICD-10-CM | POA: Diagnosis not present

## 2014-02-22 DIAGNOSIS — N949 Unspecified condition associated with female genital organs and menstrual cycle: Secondary | ICD-10-CM | POA: Diagnosis not present

## 2014-02-22 LAB — CULTURE, BLOOD (ROUTINE X 2)
Culture: NO GROWTH
Culture: NO GROWTH

## 2014-02-22 NOTE — Progress Notes (Signed)
Order for a shower bench has been faxed to [employer] as CSW was informed that [facility] is out of network.  Hunt Oris, MSW, [place]

## 2014-02-23 ENCOUNTER — Ambulatory Visit
Admission: RE | Admit: 2014-02-23 | Discharge: 2014-02-23 | Disposition: A | Discharge status: Home / Self Care | Payer: Medicare HMO | Source: Ambulatory Visit | Attending: Radiation Oncology | Admitting: Radiation Oncology

## 2014-02-23 ENCOUNTER — Telehealth: Payer: Self-pay | Admitting: Family Medicine

## 2014-02-23 ENCOUNTER — Encounter: Payer: Self-pay | Admitting: Clinical

## 2014-02-23 ENCOUNTER — Encounter: Payer: Self-pay | Admitting: Radiation Oncology

## 2014-02-23 ENCOUNTER — Ambulatory Visit: Payer: Medicare HMO

## 2014-02-23 VITALS — BP 133/52 | HR 79 | Temp 98.2°F | Resp 20 | Ht 62.0 in | Wt 228.4 lb

## 2014-02-23 DIAGNOSIS — C7951 Secondary malignant neoplasm of bone: Principal | ICD-10-CM

## 2014-02-23 DIAGNOSIS — Z510 Encounter for antineoplastic radiation therapy: Secondary | ICD-10-CM | POA: Diagnosis not present

## 2014-02-23 DIAGNOSIS — C50919 Malignant neoplasm of unspecified site of unspecified female breast: Secondary | ICD-10-CM

## 2014-02-23 NOTE — Telephone Encounter (Signed)
Lynn Ito called from pt insurance carry and they needs notes on why patient needs a wheelchair. They need why it is medically necessary. Please fax these to [phone] attention [place]. jw

## 2014-02-23 NOTE — Progress Notes (Signed)
CSW received a fax from [facility] requesting for a new application to be done because the diagnosis were not legible. CSW has placed new application in PCP box for completion.  Hunt Oris, MSW, [place]

## 2014-02-23 NOTE — Progress Notes (Signed)
  Radiation Oncology         [phone] ________________________________  Name: Anita [name] MRN: [id number]  Date: 02/23/2014  DOB: 1946-08-23  Weekly Radiation Therapy Management  Diagnosis: metastatic breast cancer  Current Dose: 27 Gy     Planned Dose:  30 Gy  Narrative . . . . . . . . The patient presents for routine under treatment assessment.                                   The patient is without complaint. She has a cast on her right arm at this time. She does have continued pain in both upper leg areas where she is receiving radiation treatment.                                 Set-up films were reviewed.                                 The chart was checked. Physical Findings. . .  height is 5\' 2"  (1.575 m) and weight is 228 lb 6.4 oz (103.602 kg). Her oral temperature is 98.2 F (36.8 C). Her blood pressure is 133/52 and her pulse is 79. Her respiration is 20 and oxygen saturation is 96%. . Weight essentially stable.  No significant changes. Impression . . . . . . . The patient is tolerating radiation. Plan . . . . . . . . . . . . Continue treatment as planned.  ________________________________   Blair Promise, PhD, MD

## 2014-02-23 NOTE — Progress Notes (Signed)
Anita [name] has completed 9 fractions to her femurs.  She denies pain.  She does have pain in both of her upper legs when she walks and is having trouble ambulating.  She is using a cane today.he says it started last week before she went in the hospital.  She was dc'd from the hospital Thursday night.  She denies skin irritation on either leg.  She does have skin irritation/redness on her lower abomen area.  She says that her primary doctor said it is a yeast infection and has her using lotrimin cream twice a day.  She does have radiaplex but is not using it.  She also has shortness of breath with activity.  Her oxygen saturation when ambulating is 94% today.  She does have oxygen at home if needed.

## 2014-02-24 ENCOUNTER — Ambulatory Visit: Payer: Medicare HMO

## 2014-02-24 ENCOUNTER — Ambulatory Visit
Admission: RE | Admit: 2014-02-24 | Discharge: 2014-02-24 | Disposition: A | Discharge status: Home / Self Care | Payer: Medicare HMO | Source: Ambulatory Visit | Attending: Radiation Oncology | Admitting: Radiation Oncology

## 2014-02-24 DIAGNOSIS — Z510 Encounter for antineoplastic radiation therapy: Secondary | ICD-10-CM | POA: Diagnosis not present

## 2014-02-24 NOTE — Telephone Encounter (Signed)
Patient has metastatic breast cancer and has difficulty walking secondary to pain.  Anita [name], will you forward/fax some of my notes or I can call them (If I get the #)

## 2014-02-24 NOTE — Telephone Encounter (Signed)
Faxed out

## 2014-02-25 ENCOUNTER — Telehealth: Payer: Self-pay | Admitting: Family Medicine

## 2014-02-25 NOTE — Telephone Encounter (Signed)
Spoke with Roselie Awkward and gave verbal ok

## 2014-02-25 NOTE — Telephone Encounter (Signed)
[facility]: Need verbal ok for physical therapy

## 2014-03-01 NOTE — Telephone Encounter (Signed)
Pt called and needs someone to call her concerning the possibility of her taking diabetic medication. jw

## 2014-03-01 NOTE — Telephone Encounter (Signed)
Patient needs to watch diet closely. She can come in to discuss options.

## 2014-03-01 NOTE — Telephone Encounter (Signed)
Patient states that her sugars have been high ad wants to know what her next step is

## 2014-03-02 ENCOUNTER — Telehealth: Payer: Self-pay | Admitting: Family Medicine

## 2014-03-02 ENCOUNTER — Encounter: Payer: Self-pay | Admitting: Radiation Oncology

## 2014-03-02 NOTE — Telephone Encounter (Signed)
Pt called because the prescription for the transfer bench is wrong and has to be re-written and also wanted to check up on the status of getting stockings for her legs. jw

## 2014-03-02 NOTE — Telephone Encounter (Signed)
Left message for a return call.Anita [name], Anita [name]

## 2014-03-02 NOTE — Progress Notes (Signed)
  Radiation Oncology         [phone] ________________________________   Name: Anita [name] MRN: [id number]  Date: 03/02/2014  DOB: 12-15-1945  End of Treatment Note  Diagnosis:   Metastatic breast cancer     Indication for treatment:  Painful osseous metastasis       Radiation treatment dates:   June 9 through July 1  Site/dose:   Left and right femur 30 gray in 10 fractions    Beams/energy:   AP PA for both treatment areas  Narrative: The patient tolerated radiation treatment relatively well.   She had some mild improvement in her pain in both upper leg regions  Plan: The patient has completed radiation treatment. The patient will return to radiation oncology clinic for routine followup in one month. I advised them to call or return sooner if they have any questions or concerns related to their recovery or treatment.  -----------------------------------  Blair Promise, PhD, MD

## 2014-03-04 ENCOUNTER — Other Ambulatory Visit (HOSPITAL_BASED_OUTPATIENT_CLINIC_OR_DEPARTMENT_OTHER): Payer: Medicare HMO

## 2014-03-04 ENCOUNTER — Encounter: Payer: Self-pay | Admitting: Internal Medicine

## 2014-03-04 ENCOUNTER — Ambulatory Visit (HOSPITAL_BASED_OUTPATIENT_CLINIC_OR_DEPARTMENT_OTHER): Payer: Medicare HMO | Admitting: Internal Medicine

## 2014-03-04 ENCOUNTER — Telehealth: Payer: Self-pay | Admitting: Internal Medicine

## 2014-03-04 ENCOUNTER — Other Ambulatory Visit: Payer: Self-pay | Admitting: Medical Oncology

## 2014-03-04 VITALS — BP 108/54 | HR 92 | Temp 98.0°F | Resp 21 | Ht 62.0 in | Wt 228.4 lb

## 2014-03-04 DIAGNOSIS — C50911 Malignant neoplasm of unspecified site of right female breast: Secondary | ICD-10-CM

## 2014-03-04 DIAGNOSIS — C7951 Secondary malignant neoplasm of bone: Principal | ICD-10-CM

## 2014-03-04 DIAGNOSIS — C50419 Malignant neoplasm of upper-outer quadrant of unspecified female breast: Secondary | ICD-10-CM

## 2014-03-04 DIAGNOSIS — C7952 Secondary malignant neoplasm of bone marrow: Secondary | ICD-10-CM

## 2014-03-04 DIAGNOSIS — C7800 Secondary malignant neoplasm of unspecified lung: Secondary | ICD-10-CM

## 2014-03-04 DIAGNOSIS — C50919 Malignant neoplasm of unspecified site of unspecified female breast: Secondary | ICD-10-CM

## 2014-03-04 DIAGNOSIS — N6310 Unspecified lump in the right breast, unspecified quadrant: Secondary | ICD-10-CM

## 2014-03-04 LAB — COMPREHENSIVE METABOLIC PANEL (CC13)
ALK PHOS: 130 U/L (ref 40–150)
ALT: 16 U/L (ref 0–55)
ANION GAP: 10 meq/L (ref 3–11)
AST: 11 U/L (ref 5–34)
Albumin: 3.1 g/dL — ABNORMAL LOW (ref 3.5–5.0)
BILIRUBIN TOTAL: 0.44 mg/dL (ref 0.20–1.20)
BUN: 24.4 mg/dL (ref 7.0–26.0)
CO2: 21 mEq/L — ABNORMAL LOW (ref 22–29)
CREATININE: 1.3 mg/dL — AB (ref 0.6–1.1)
Calcium: 9.4 mg/dL (ref 8.4–10.4)
Chloride: 108 mEq/L (ref 98–109)
Glucose: 122 mg/dl (ref 70–140)
Potassium: 4 mEq/L (ref 3.5–5.1)
SODIUM: 140 meq/L (ref 136–145)
TOTAL PROTEIN: 6.6 g/dL (ref 6.4–8.3)

## 2014-03-04 LAB — CBC WITH DIFFERENTIAL/PLATELET
BASO%: 0.6 % (ref 0.0–2.0)
Basophils Absolute: 0 10*3/uL (ref 0.0–0.1)
EOS%: 4.2 % (ref 0.0–7.0)
Eosinophils Absolute: 0.1 10*3/uL (ref 0.0–0.5)
HEMATOCRIT: 26.1 % — AB (ref 34.8–46.6)
HGB: 8.5 g/dL — ABNORMAL LOW (ref 11.6–15.9)
LYMPH%: 11.4 % — AB (ref 14.0–49.7)
MCH: 26.7 pg (ref 25.1–34.0)
MCHC: 32.7 g/dL (ref 31.5–36.0)
MCV: 81.6 fL (ref 79.5–101.0)
MONO#: 0.3 10*3/uL (ref 0.1–0.9)
MONO%: 13.7 % (ref 0.0–14.0)
NEUT#: 1.8 10*3/uL (ref 1.5–6.5)
NEUT%: 70.1 % (ref 38.4–76.8)
PLATELETS: 199 10*3/uL (ref 145–400)
RBC: 3.2 10*6/uL — ABNORMAL LOW (ref 3.70–5.45)
RDW: 24 % — ABNORMAL HIGH (ref 11.2–14.5)
WBC: 2.5 10*3/uL — ABNORMAL LOW (ref 3.9–10.3)
lymph#: 0.3 10*3/uL — ABNORMAL LOW (ref 0.9–3.3)

## 2014-03-04 LAB — LACTATE DEHYDROGENASE (CC13): LDH: 170 U/L (ref 125–245)

## 2014-03-04 MED ORDER — PALBOCICLIB 125 MG PO CAPS: ORAL_CAPSULE | ORAL | Status: DC

## 2014-03-04 MED ORDER — MIRTAZAPINE 15 MG PO TABS: 15.0000 mg | ORAL_TABLET | Freq: Every day | ORAL | Status: DC

## 2014-03-04 MED ORDER — DOXYCYCLINE HYCLATE 100 MG PO TABS: 100.0000 mg | ORAL_TABLET | Freq: Two times a day (BID) | ORAL | Status: DC

## 2014-03-04 MED ORDER — CLOTRIMAZOLE 1 % EX CREA: 1.0000 "application " | TOPICAL_CREAM | Freq: Two times a day (BID) | CUTANEOUS | Status: DC

## 2014-03-04 NOTE — Telephone Encounter (Signed)
gv pt/assistant/friend appt schedule for aug. central will call w/ct - pt/assistant/friend aware.

## 2014-03-04 NOTE — Progress Notes (Signed)
Anita [name]  Anita Salt, DO [place] [address]  DIAGNOSIS: 1) Metastatic mammary carcinoma with met to bones. 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 Biopsy only. Added Palbociclib 125 mg daily for 21 days on and 7 days off on 01/01/2014. She is on Cycle #1, day 13 of letrozole plus palbociclib.  Adjunctive zometa added on 01/01/2014.  Started palliative XRT to the left and right femur (30 Gy in 10 fractions) on 02/02/2014 per Dr. Gery Pray which was finished on 02/24/2014  INTERVAL HISTORY: Anita [name] 68 y.o. female with a history of right breast cancer with metastases to the bone/lungs here for 1 month followup.  She was last seen by me on 02/02/2014.    Today, she is accompanied by her daughter Anita [name] .   She continues to recover from her recent right arm pathologic fracture.  She notes irritation of her legs posteriorly bilaterally (worse on the left).   She was admitted to family medicine teaching service on 06/22 and discharged on 6/25 with worsening lower extremity edema.  Palliative care was consulted and patient was agreeable to continue radiation therapy and endocrine therapy plus palbociclib for now.  She continues to have a hoarse voice.  She notes some improvement in her generalized bone pain s/p completion of her XRT.   She will have the right splint removed by orthopedics next month. She is on her off week for palbociclib.   MEDICAL HISTORY: Past Medical History  Diagnosis Date  . Fibroid   . Morbid obesity   . 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 COPD (chronic obstructive pulmonary disease)   . Chronic diastolic CHF (congestive heart failure)   . NSTEMI (non-ST elevated myocardial infarction)     Cath November 2014 LAD 40% stenosis, first diagonal 80% stenosis, circumflex 20% stenosis,  right coronary artery occluded. The EF was 35-40% at that time.  . Anemia     a. Adm 09/2012 with melena, Hgb 5.8 -> transfused. EGD/colonoscopy unrevealing.  . Breast cancer     a. Mets to bone. ER 100%/ PR 0%/Her2 neu negative.  [name] Ulcers of both lower legs   . Tobacco abuse   . Hyperlipidemia 02/11/2013  . Chronic respiratory failure     a. On O2 qhs. also portable O2.  . Chronic renal insufficiency   . Lesion of vocal cord     a. CT 05/2013 concerning for tumor.  . Shortness of breath   . Depression   . Diabetes mellitus without complication   . Anginal pain   . Dementia     very mild   INTERIM HISTORY: has DM2 (diabetes mellitus, type 2); COPD (chronic obstructive pulmonary disease); Chronic respiratory failure; Chronic combined systolic and diastolic congestive heart failure; CAD (coronary artery disease); Chronic kidney disease; Anemia of chronic disease; Breast mass, right; History of tobacco abuse; Breast cancer metastasized to bone; Hyperlipidemia; Insomnia; Lesion of vocal cord; Closed right arm fracture; Lower extremity weakness; and Intertrigo on her problem list.    ALLERGIES:  is allergic to omnipaque and benzene.  MEDICATIONS: has a current medication list which includes the following prescription(s): albuterol, aspirin, atorvastatin, clotrimazole, furosemide, hydrocodone-acetaminophen, isosorbide mononitrate, letrozole, mirtazapine, naproxen sodium, nitroglycerin, NON FORMULARY, palbociclib, potassium chloride sa, umeclidinium-vilanterol, and cholecalciferol.  SURGICAL HISTORY:  Past Surgical History  Procedure Laterality Date  .  Colonoscopy, esophagogastroduodenoscopy (egd) and esophageal dilation N/A 10/13/2012    Procedure: colonoscopy and egd;  Surgeon: Wonda Horner, MD;  Location: [facility];  Service: Endoscopy;  Laterality: N/A;   REVIEW OF SYSTEMS:   Constitutional: Denies fevers, chills or abnormal weight loss Eyes: Denies blurriness of vision Ears, nose,  mouth, throat, and face: Denies mucositis or sore throat Respiratory: Denies cough, dyspnea or wheezes Cardiovascular: Denies palpitation, chest discomfort or lower extremity swelling Gastrointestinal:  Denies nausea, heartburn or change in bowel habits Skin: Denies abnormal skin rashes Lymphatics: Denies new lymphadenopathy or easy bruising Neurological:Denies numbness, tingling or new weaknesses Behavioral/Psych: Mood is stable, no new changes  All other systems were reviewed with the patient and are negative.  PHYSICAL EXAMINATION: ECOG PERFORMANCE STATUS: 1 - Symptomatic but completely ambulatory  Blood pressure 108/54, pulse 92, temperature 98 F (36.7 C), temperature source Oral, resp. rate 21, height $RemoveBe'5\' 2"'tDrEqFywP$  (1.575 m), weight 228 lb 6.4 oz (103.602 kg), SpO2 96.00%.  GENERAL:alert, no distress and comfortable morbidly obese woman, chronically ill appearing SKIN: skin color, texture, turgor are normal; posterior legs with irritation and erythema with a small abrasion on the posterior left thigh with mild TTP.  EYES: normal, Conjunctiva are pink and non-injected, sclera clear OROPHARYNX:no exudate, no erythema and lips, buccal mucosa, and tongue normal  NECK: supple, thyroid normal size, non-tender, without nodularity LYMPH:  no palpable lymphadenopathy in the cervical, axillary LUNGS: coarse breathing with slightly increased breathing rate HEART: regular rate & rhythm and no murmurs and 2+ lower extremity edema BREAST: deferred ABDOMEN:abdomen soft, non-tender and normal bowel sounds; obese Musculoskeletal:no cyanosis of digits and no clubbing; R arm in a brace NEURO: alert & oriented x 3 with fluent speech, no focal motor/sensory deficits  LABORATORY DATA: CBC    Component Value Date/Time   WBC 2.5* 03/04/2014 1508   WBC 3.1* 02/18/2014 0517   RBC 3.20* 03/04/2014 1508   RBC 3.70* 02/18/2014 0517   RBC 3.18* 09/26/2012 0518   HGB 8.5* 03/04/2014 1508   HGB 9.4* 02/18/2014 0517    HCT 26.1* 03/04/2014 1508   HCT 29.9* 02/18/2014 0517   PLT 199 03/04/2014 1508   PLT 228 02/18/2014 0517   MCV 81.6 03/04/2014 1508   MCV 80.8 02/18/2014 0517   MCH 26.7 03/04/2014 1508   MCH 25.4* 02/18/2014 0517   MCHC 32.7 03/04/2014 1508   MCHC 31.4 02/18/2014 0517   RDW 24.0* 03/04/2014 1508   RDW 20.7* 02/18/2014 0517   LYMPHSABS 0.3* 03/04/2014 1508   LYMPHSABS 0.5* 02/18/2014 0517   MONOABS 0.3 03/04/2014 1508   MONOABS 0.7 02/18/2014 0517   EOSABS 0.1 03/04/2014 1508   EOSABS 0.1 02/18/2014 0517   BASOSABS 0.0 03/04/2014 1508   BASOSABS 0.0 02/18/2014 0517   CMP     Component Value Date/Time   NA 140 03/04/2014 1508   NA 138 02/18/2014 0517   K 4.0 03/04/2014 1508   K 3.6* 02/18/2014 0517   CL 99 02/18/2014 0517   CL 101 02/11/2013 0950   CO2 21* 03/04/2014 1508   CO2 23 02/18/2014 0517   GLUCOSE 122 03/04/2014 1508   GLUCOSE 148* 02/18/2014 0517   GLUCOSE 143* 02/11/2013 0950   BUN 24.4 03/04/2014 1508   BUN 27* 02/18/2014 0517   CREATININE 1.3* 03/04/2014 1508   CREATININE 1.23* 02/18/2014 0517   CREATININE 1.25* 03/30/2013 1558   CALCIUM 9.4 03/04/2014 1508   CALCIUM 9.4 02/18/2014 0517   CALCIUM 6.6* 09/29/2012 1118   PROT  6.6 03/04/2014 1508   PROT 6.5 02/15/2014 1350   ALBUMIN 3.1* 03/04/2014 1508   ALBUMIN 3.3* 02/15/2014 1350   AST 11 03/04/2014 1508   AST 10 02/15/2014 1350   ALT 16 03/04/2014 1508   ALT 10 02/15/2014 1350   ALKPHOS 130 03/04/2014 1508   ALKPHOS 132* 02/15/2014 1350   BILITOT 0.44 03/04/2014 1508   BILITOT 0.4 02/15/2014 1350   GFRNONAA 45* 02/18/2014 0517   GFRAA 52* 02/18/2014 0517   Results for INDYAH, SAULNIER (MRN [id number]) as of 03/05/2014 08:44  Ref. Range 12/30/2013 11:33  Iron Latest Range: 42-135 ug/dL 38 (L)  UIBC Latest Range: 125-400 ug/dL 593  TIBC Latest Range: 250-470 ug/dL 012  %SAT Latest Range: 21-57 % 12 (L)  Ferritin Latest Range: 10-291 ng/mL 139    RADIOGRAPHIC STUDIES: None  ASSESSMENT: Metastatic breast cancer on palliative letrozole plus palbociclib, with progressive  disease.   PLAN:  1. Acute right distal oblique fracture (12/31/2013). --Likely secondary to number #3. We  started zometa q 4 weeks (first dose on 01/01/2014) to reduce further skeletal events.  Patient had declined previously.  She consented understanding indications, benefits and risks including but not limited to nephrotoxicity, hypocalcemia, osteoradionecrosis of the jaw.  Her arm is in a brace.  She has not received additional doses due to a rise in her creatinine which was likely multifactorial in nature.  We will consider restarting zometa with her next visit.   2. Bone Pain secondary #3, improving. --Started palliative XRT to left/right femur on 02/02/2014 per Dr. Roselind Messier.  She  received 30 Gy in 10 fractions. It ended on 02/24/2014.  She reports moderate improvement.  3. Bilateral posterior leg cellulitis. --We will start a 10-day treatment with doxycycline 100 mg bid.  Patient counseled on frequent turning to avoid pressure ulcers.     4.  Metastatic breast cancer (ER pos/PR pos/ HER2 neg) with spread to bone, lungs.  -Clinically, she has had a response with stable breast mass. However, her recent CT of Neck and arm x-rays show progressive bone lesions.  Further, her  CT of Chest showed a new lung nodule.  She is tolerating letrozole well without bone/muscle pain, hot flash/night sweat.  We added palbociclib based on Irving Copas, 2015)  She takes 125 mg daily for 21 days then 7 days off with her letrozole daily.  Previously, we discussed extensively the indications, benefits and risks including but not limited to abnormal absolute lymphocyte count, neutropenia, leukopenia, thrombocytopenia, weakness, alopecia and fatigue and nausea.  She understood these risks and agree to proceed.  She is on day #22 with a drop in her hematological counts.  Given persistent low counts we recommended to START  2 weeks on followed by one week off of palbociclib for cycle #3.  Her CA 27.29 is stable.   We will restage  with a CT of abdomen/chest without contrast s/p 3 cycles of combination letrozole and palbociclib on 04/05/2014.    5. Stage III (T1N1M0) Laryngeal Carcinoma, probable.   --She has been seen by radiation oncology.  In the presence of Nurse Ardine Eng, we reviewed her CT of neck as noted above and she agreed to further evaluation by Dr. Roselind Messier.  She declined intervention presently.  She is being followed by Dr. Roselind Messier.   6. Leukopenia/Anemia of chronic disease plus/minus iron defiency. -- Past work up for reversible causes of anemia were negative.  --Her WBC are decreasing.  This can in part be explained by her palbociclib.  We will monitor her labs  7. Grade 2 dystolic dysfunction. --She is not fluid overloaded today on exam. She is on Lasix, nitrogylerin prn, losartan. Increase lasix prn and weight daily.   8. Diabetes mellitus, type II. Diet controlled.   9. COPD: On albuterol prn. She remains on home oxygen.  10. Chronic renal insufficiency. Creatinine clearance of 48 mL/min today.    11. Follow up: In about 1 month with CBC, CMP, CA 27 29. CT of abdomen and chest without contrast on 04/05/2014 with follow up on 04/07/2014.   All questions were answered. The patient knows to call the clinic with any problems, questions or concerns. We can certainly see the patient much sooner if necessary.  I spent 25 minutes counseling the patient face to face. The total time spent in the appointment was 40 minutes.    Oktober Glazer, MD 03/04/2014 4:34 PM

## 2014-03-04 NOTE — Patient Instructions (Signed)
Doxycycline delayed-release capsules What is this medicine? DOXYCYCLINE (dox i SYE kleen) is a tetracycline antibiotic. It is used to treat certain kinds of bacterial infections, Lyme disease, and malaria. It will not work for colds, flu, or other viral infections. This medicine may be used for other purposes; ask your health care provider or pharmacist if you have questions. COMMON BRAND NAME(S): Doryx, Oracea What should I tell my health care provider before I take this medicine? They need to know if you have any of these conditions: -bowel disease like colitis -liver disease -long exposure to sunlight like working outdoors -an unusual or allergic reaction to doxycycline, tetracycline antibiotics, other medicines, foods, dyes, or preservatives -pregnant or trying to get pregnant -breast-feeding How should I use this medicine? Take this medicine by mouth with a full glass of water. Follow the directions on the prescription label. Do not crush or chew. The capsules may be opened and the pellets sprinkled on applesauce. Swallow the pellets whole without chewing. Follow with an 8 ounce glass of water to help you swallow all the pellets. Do not prepare a dose and store for later use. The applesauce mixture should be taken immediately after you prepare it. It is best to take this medicine without other food, but if it upsets your stomach take it with food. Take your medicine at regular intervals. Do not take your medicine more often than directed. Take all of your medicine as directed even if you think your are better. Do not skip doses or stop your medicine early. Talk to your pediatrician regarding the use of this medicine in children. Special care may be needed. While this drug may be prescribed for children as young as 38 years old for selected conditions, precautions do apply. Overdosage: If you think you have taken too much of this medicine contact a poison control center or emergency room at  once. NOTE: This medicine is only for you. Do not share this medicine with others. What if I miss a dose? If you miss a dose, take it as soon as you can. If it is almost time for your next dose, take only that dose. Do not take double or extra doses. What may interact with this medicine? -antacids -barbiturates -birth control pills -bismuth subsalicylate -carbamazepine -methoxyflurane -other antibiotics -phenytoin -vitamins that contain iron -warfarin This list may not describe all possible interactions. Give your health care provider a list of all the medicines, herbs, non-prescription drugs, or dietary supplements you use. Also tell them if you smoke, drink alcohol, or use illegal drugs. Some items may interact with your medicine. What should I watch for while using this medicine? Tell your doctor or health care professional if your symptoms do not improve. Do not treat diarrhea with over the counter products. Contact your doctor if you have diarrhea that lasts more than 2 days or if it is severe and watery. Do not take this medicine just before going to bed. It may not dissolve properly when you lay down and can cause pain in your throat. Drink plenty of fluids while taking this medicine to also help reduce irritation in your throat. This medicine can make you more sensitive to the sun. Keep out of the sun. If you cannot avoid being in the sun, wear protective clothing and use sunscreen. Do not use sun lamps or tanning beds/booths. If you are being treated for a sexually transmitted infection, avoid sexual contact until you have finished your treatment. Your sexual partner may also need  treatment. Avoid antacids, aluminum, calcium, magnesium, and iron products for 4 hours before and 2 hours after taking a dose of this medicine. Birth control pills may not work properly while you are taking this medicine. Talk to your doctor about using an extra method of birth control. If you are using  this medicine to prevent malaria, you should still protect yourself from contact with mosquitos. Stay in screened-in areas, use mosquito nets, keep your body covered, and use an insect repellent. What side effects may I notice from receiving this medicine? Side effects that you should report to your doctor or health care professional as soon as possible: -allergic reactions like skin rash, itching or hives, swelling of the face, lips, or tongue -difficulty breathing -fever -itching in the rectal or genital area -pain on swallowing -redness, blistering, peeling or loosening of the skin, including inside the mouth -severe stomach pain or cramps -unusual bleeding or bruising -unusually weak or tired -yellowing of the eyes or skin Side effects that usually do not require medical attention (report to your doctor or health care professional if they continue or are bothersome): -diarrhea -loss of appetite -nausea, vomiting This list may not describe all possible side effects. Call your doctor for medical advice about side effects. You may report side effects to FDA at 1-800-FDA-1088. Where should I keep my medicine? Keep out of the reach of children. Store at room temperature, below 25 degrees C (77 degrees F). Protect from light. Keep container tightly closed. Throw away any unused medicine after the expiration date. Taking this medicine after the expiration date can make you seriously ill. NOTE: This sheet is a summary. It may not cover all possible information. If you have questions about this medicine, talk to your doctor, pharmacist, or health care provider.  2015, Elsevier/Gold Standard. (2007-12-11 14:16:19) Cellulitis Cellulitis is an infection of the skin and the tissue beneath it. The infected area is usually red and tender. Cellulitis occurs most often in the arms and lower legs.  CAUSES  Cellulitis is caused by bacteria that enter the skin through cracks or cuts in the skin. The most  common types of bacteria that cause cellulitis are Staphylococcus and Streptococcus. SYMPTOMS   Redness and warmth.  Swelling.  Tenderness or pain.  Fever. DIAGNOSIS  Your caregiver can usually determine what is wrong based on a physical exam. Blood tests may also be done. TREATMENT  Treatment usually involves taking an antibiotic medicine. HOME CARE INSTRUCTIONS   Take your antibiotics as directed. Finish them even if you start to feel better.  Keep the infected arm or leg elevated to reduce swelling.  Apply a warm cloth to the affected area up to 4 times per day to relieve pain.  Only take over-the-counter or prescription medicines for pain, discomfort, or fever as directed by your caregiver.  Keep all follow-up appointments as directed by your caregiver. SEEK MEDICAL CARE IF:   You notice red streaks coming from the infected area.  Your red area gets larger or turns dark in color.  Your bone or joint underneath the infected area becomes painful after the skin has healed.  Your infection returns in the same area or another area.  You notice a swollen bump in the infected area.  You develop new symptoms. SEEK IMMEDIATE MEDICAL CARE IF:   You have a fever.  You feel very sleepy.  You develop vomiting or diarrhea.  You have a general ill feeling (malaise) with muscle aches and pains. MAKE SURE  YOU:   Understand these instructions.  Will watch your condition.  Will get help right away if you are not doing well or get worse. Document Released: 05/23/2005 Document Revised: 02/12/2012 Document Reviewed: 10/29/2011 [facility] Patient Information 2015 [place], [place]. This information is not intended to replace advice given to you by your health care provider. Make sure you discuss any questions you have with your health care provider.

## 2014-03-05 ENCOUNTER — Encounter: Payer: Self-pay | Admitting: Pulmonary Disease

## 2014-03-05 ENCOUNTER — Ambulatory Visit (INDEPENDENT_AMBULATORY_CARE_PROVIDER_SITE_OTHER): Payer: Commercial Managed Care - HMO | Admitting: Pulmonary Disease

## 2014-03-05 ENCOUNTER — Telehealth: Payer: Self-pay

## 2014-03-05 ENCOUNTER — Encounter: Payer: Self-pay | Admitting: Internal Medicine

## 2014-03-05 VITALS — BP 130/76 | HR 108 | Temp 98.9°F | Ht 62.0 in | Wt 232.0 lb

## 2014-03-05 DIAGNOSIS — R0902 Hypoxemia: Secondary | ICD-10-CM

## 2014-03-05 DIAGNOSIS — J410 Simple chronic bronchitis: Secondary | ICD-10-CM

## 2014-03-05 DIAGNOSIS — J9610 Chronic respiratory failure, unspecified whether with hypoxia or hypercapnia: Secondary | ICD-10-CM

## 2014-03-05 DIAGNOSIS — J9611 Chronic respiratory failure with hypoxia: Secondary | ICD-10-CM

## 2014-03-05 LAB — CANCER ANTIGEN 27.29: CA 27.29: 48 U/mL — ABNORMAL HIGH (ref 0–39)

## 2014-03-05 MED ORDER — TIOTROPIUM BROMIDE MONOHYDRATE 18 MCG IN CAPS: 18.0000 ug | ORAL_CAPSULE | Freq: Every day | RESPIRATORY_TRACT | Status: DC

## 2014-03-05 NOTE — Telephone Encounter (Signed)
Pt called about a donut pillow. Returned pt call at [phone].  [facility] does not carry them. [facility] does carry them, about $16-19. [facility] said insurance does not cover them. Pt can also sit on a pillow with less pressure on tailbone area.

## 2014-03-05 NOTE — Patient Instructions (Signed)
Stop anoro Spiriva one puff daily Follow up in 6 months

## 2014-03-05 NOTE — Assessment & Plan Note (Signed)
She is to continue 2 liters oxygen at night, and use prn during day with activity.

## 2014-03-05 NOTE — Assessment & Plan Note (Signed)
She was intolerant of anoro.  Will resume spiriva.  She is to continue prn albuterol.

## 2014-03-05 NOTE — Progress Notes (Signed)
Chief Complaint  Patient presents with  . Follow-up    Pt reports no change in breathing since last OV> Still having SOB with exertion. Pt gasping upon arrival stating that she needs to put Oxygen on--sats were 94% at rest room air.  Pt states that she is not taking Anoro daily d/t tachycardia associated with it.     History of Present Illness: Anita [name] is a 69 y.o. female former smoker with COPD and possible sleep apnea using supplemental oxygen at night.  She also has history of stage IV breast cancer, and systolic/diastolic CHF.  She has not been able to use anoro >> caused palpitations.  She wants to go back to spiriva.  She is not needing albuterol much.  She is not having cough, wheeze, or chest congestion.  She developed a pathologic fx in her Rt arm.  She is being tx for cellulitis in her legs.  She uses her oxygen at night and with activity during the day.  CXR from 02/15/14 was negative.  TESTS: Echo 09/25/12 >> grade 2 diastolic dysfunction, mild RV dilation, EF 55% CT chest 10/08/12 >> 6 mm GGO nodule RUL, diffuse sclerotic changes on bone windows ONO with RA 10/23/12 >> Test time 7 hrs 53 min. Mean SpO2 89.3%, low SpO2 66%. Spent 46 min with SpO2 < 88%.  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  Anita [name]  has past surgical history that includes Colonoscopy, esophagogastroduodenoscopy (egd) and esophageal dilation (N/A, 10/13/2012).  Prior to Admission medications   Medication Sig  Start Date End Date Taking? Authorizing Provider  albuterol (PROVENTIL HFA;VENTOLIN HFA) 108 (90 BASE) MCG/ACT inhaler Inhale 1 puff into the lungs 2 (two) times daily as needed for wheezing or shortness of breath.    Historical Provider, MD  aspirin EC 81 MG EC tablet Take 1 tablet (81 mg total) by mouth daily. 10/02/12   Leone Haven, MD  atorvastatin (LIPITOR) 40 MG tablet Take 1 tablet (40 mg total) by mouth daily at 6 PM. 10/02/12   Leone Haven, MD  Calcium Citrate (CALCITRATE PO) Take 1 tablet by mouth daily.    Historical Provider, MD  pantoprazole (PROTONIX) 40 MG tablet Take 1 tablet (40 mg total) by mouth daily. 10/14/12   Leone Haven, MD  torsemide (DEMADEX) 20 MG tablet Take 1 tablet (20 mg total) by mouth daily. 10/15/12   Leone Haven, MD    Allergies  Allergen Reactions  . Omnipaque [Iohexol]     Pt claims she developed hives after given contrast  . Benzene Rash     Physical Exam:  General - No distress ENT - No sinus tenderness, MP 4, no oral exudate, no LAN, hoarse voice Cardiac - s1s2 regular, no murmur Chest - No wheeze/rales/dullness Back - No focal tenderness Abd - Soft, non-tender Ext - 1+ edema, Rt arm in cast Neuro - Normal strength Skin - b/l mild erythema Psych - normal mood, and behavior   Assessment/Plan:  Chesley Mires, MD [facility] Pager:  [phone] 03/05/2014, 3:06 PM

## 2014-03-10 ENCOUNTER — Telehealth: Payer: Self-pay | Admitting: Family Medicine

## 2014-03-10 ENCOUNTER — Other Ambulatory Visit: Payer: Self-pay | Admitting: Family Medicine

## 2014-03-10 NOTE — Telephone Encounter (Signed)
Rx sent 

## 2014-03-10 NOTE — Telephone Encounter (Signed)
Cindy from [place] calls requesting an order/RX for a gel seat cushion for patient's wheelchair. Please include on the order/RX that patient has stage 2 pressure ulcer and immobility. Please fax to [facility] at [phone]. Call [place] with any questions [phone].

## 2014-03-11 ENCOUNTER — Telehealth: Payer: Self-pay | Admitting: *Deleted

## 2014-03-11 NOTE — Telephone Encounter (Signed)
Dr Vonita Moss from [place] calls requesting an order/RX for [employer]. Please fax to [facility] at [phone]. Call [place] with any questions [phone]. Please advise.thank you.Zahirah Cheslock, Lewie Loron

## 2014-03-11 NOTE — Telephone Encounter (Signed)
Patient called asking if her CT scan on 04/05/14 was with or without contrast. She was told by Dr. Juliann Mule it was without contrast. She states she was given a bottle of contrast. Per Dr. Boyce Medici note and CT order - CT is ordered without contrast. Instructed patient not to drink the contrast. Patient voiced understanding.

## 2014-03-12 ENCOUNTER — Encounter: Payer: Self-pay | Admitting: Cardiology

## 2014-03-12 ENCOUNTER — Other Ambulatory Visit: Payer: Self-pay | Admitting: Cardiology

## 2014-03-12 ENCOUNTER — Telehealth: Payer: Self-pay

## 2014-03-12 ENCOUNTER — Ambulatory Visit (INDEPENDENT_AMBULATORY_CARE_PROVIDER_SITE_OTHER): Payer: Commercial Managed Care - HMO | Admitting: Cardiology

## 2014-03-12 VITALS — BP 110/59 | HR 86 | Ht 62.0 in | Wt 235.0 lb

## 2014-03-12 DIAGNOSIS — I2510 Atherosclerotic heart disease of native coronary artery without angina pectoris: Secondary | ICD-10-CM | POA: Diagnosis not present

## 2014-03-12 MED ORDER — METOLAZONE 2.5 MG PO TABS: 2.5000 mg | ORAL_TABLET | Freq: Every day | ORAL | Status: DC

## 2014-03-12 NOTE — Progress Notes (Signed)
HPI  The patient presents for follow up after a hospitalization for treatment of dyspnea and anasarca.  She's being managed for metastatic breast cancer.  She did have an outpatient low risk stress nuclear study with an old inferior wall scar of small size involving the apical inferior, mid-inferior and basal inferior segments. No significant ischemia and an LV Ejection Fraction of 49%.   Her ejection fraction on echo in the hospital was 55%. She had evidence of diastolic dysfunction. She has been hospitalized twice since I saw her. Review these records today. Looks like her discharge weight was about 228 pounds. She is now up about 7 pounds. She feels that the predominant symptoms of lower extremity edema. She's taken extra Lasix although I see some confusion. Her notes that she was discharged on 80 mg and she says she'll take a total of 80 it she's gained weight. She says she's not getting the weight off. She keeps her feet out from her but not above her head. She hasn't had any salt. She's not had any acute shortness of breath, PND or orthopnea. He does sleep in an easy chair because she has a pathologic fracture in her right arm.   Allergies  Allergen Reactions  . Omnipaque [Iohexol]     Pt claims she developed hives after given contrast  . Benzene Rash    Current Outpatient Prescriptions  Medication Sig Dispense Refill  . albuterol (PROVENTIL HFA;VENTOLIN HFA) 108 (90 BASE) MCG/ACT inhaler Inhale 2 puffs into the lungs 2 (two) times daily as needed for wheezing or shortness of breath.       [name] aspirin EC 81 MG EC tablet Take 1 tablet (81 mg total) by mouth daily.  30 tablet  3  . atorvastatin (LIPITOR) 40 MG tablet Take 1 tablet (40 mg total) by mouth daily at 6 PM.  30 tablet  0  . clotrimazole (LOTRIMIN) 1 % cream Apply 1 application topically 2 (two) times daily.  30 g  0  . doxycycline (VIBRA-TABS) 100 MG tablet Take 1 tablet (100 mg total) by mouth 2 (two) times daily.  20 tablet  0    . furosemide (LASIX) 40 MG tablet Take 80 mg by mouth daily.       [name] HYDROcodone-acetaminophen (NORCO/VICODIN) 5-325 MG per tablet Take 2 tablets by mouth every 4 (four) hours as needed for moderate pain.  30 tablet  0  . isosorbide mononitrate (IMDUR) 30 MG 24 hr tablet Take 60 mg by mouth daily.      [name] letrozole (FEMARA) 2.5 MG tablet Take 1 tablet (2.5 mg total) by mouth daily.  90 tablet  3  . mirtazapine (REMERON) 15 MG tablet Take 1 tablet (15 mg total) by mouth at bedtime.  30 tablet  1  . naproxen sodium (ANAPROX) 220 MG tablet Take 220 mg by mouth daily as needed (for pain).      . nitroGLYCERIN (NITROSTAT) 0.4 MG SL tablet Place 1 tablet (0.4 mg total) under the tongue every 5 (five) minutes as needed for chest pain.  30 tablet  0  . NON FORMULARY Place 3 L into the nose as needed (oxygen).       . palbociclib (IBRANCE) 125 MG capsule Take one capsule by mouth with breakfast for 21 days. Rest 7 days then repeat.  21 capsule  0  . potassium chloride SA (K-DUR,KLOR-CON) 20 MEQ tablet Take 1-2 tablets (20-40 mEq total) by mouth 2 (two) times daily. 2 tablets (40 mEq)  AM and 1 tablet ( 20 mEq) PM      . tiotropium (SPIRIVA) 18 MCG inhalation capsule Place 1 capsule (18 mcg total) into inhaler and inhale daily.  30 capsule  6  . VITAMIN D, CHOLECALCIFEROL, PO Take 1,000 mg by mouth daily.        No current facility-administered medications for this visit.    Past Medical History  Diagnosis Date  . Fibroid   . Morbid obesity   . 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 COPD (chronic obstructive pulmonary disease)   . Chronic diastolic CHF (congestive heart failure)   . NSTEMI (non-ST elevated myocardial infarction)     Cath November 2014 LAD 40% stenosis, first diagonal 80% stenosis, circumflex 20% stenosis, right coronary artery occluded. The EF was 35-40% at that time.  . Anemia     a. Adm 09/2012 with melena, Hgb 5.8 -> transfused. EGD/colonoscopy  unrevealing.  . Breast cancer     a. Mets to bone. ER 100%/ PR 0%/Her2 neu negative.  [name] Ulcers of both lower legs   . Tobacco abuse   . Hyperlipidemia 02/11/2013  . Chronic respiratory failure     a. On O2 qhs. also portable O2.  . Chronic renal insufficiency   . Lesion of vocal cord     a. CT 05/2013 concerning for tumor.  . Shortness of breath   . Depression   . Diabetes mellitus without complication   . Anginal pain   . Dementia     very mild    Past Surgical History  Procedure Laterality Date  . Colonoscopy, esophagogastroduodenoscopy (egd) and esophageal dilation N/A 10/13/2012    Procedure: colonoscopy and egd;  Surgeon: Wonda Horner, MD;  Location: [place];  Service: Endoscopy;  Laterality: N/A;    ROS:  As stated in the HPI and negative for all other systems.  PHYSICAL EXAM BP 110/59  Pulse 86  Ht _0  (1.575 m)  Wt 235 lb (106.595 kg)  BMI 42.97 kg/m2 PHYSICAL EXAM GEN:  No distress NECK:  No jugular venous distention at 90 degrees, waveform within normal limits, carotid upstroke brisk and symmetric, no bruits, no thyromegaly LYMPHATICS:  No cervical adenopathy LUNGS:  Few basilar crackles and BACK:  No CVA tenderness CHEST:  Unremarkable HEART:  S1 and S2 within normal limits, no S3, no S4, no clicks, no rubs, distant heart sounds murmurs ABD:  Positive bowel sounds normal in frequency in pitch, no bruits, no rebound, no guarding, unable to assess midline mass or bruit with the patient seated. EXT:  2 plus pulses throughout, severe bilateral lower extremity edema, no cyanosis no clubbing SKIN:  No rashes no nodules NEURO:  Cranial nerves II through XII grossly intact, motor grossly intact throughout PSYCH:  Cognitively intact, oriented to person place and time  EKG:  Sinus rhythm, rate 86, axis within normal limits, intervals within normal limits, no acute ST-T wave changes.  03/12/2014  ASSESSMENT AND PLAN  CAD:  This is going to be managed medically  and she's having no ongoing angina.  Cath as noted above.   CHRNOIC DIASTOLIC HF:  Then going to give her Zaroxolyn 2.5 mg she will take Saturday and Sunday. She's been given instructions to keep her feet elevated. If her weight is not down by Monday she will likely need to be hospitalized and she will call and let us know she is doing.  She understands when  necessary dosing of the Zaroxolyn and the need for blood work if she's having to take this frequently.   Hospital records reviewed today.

## 2014-03-12 NOTE — Telephone Encounter (Signed)
Faxing Rx 

## 2014-03-12 NOTE — Patient Instructions (Addendum)
Take your zaroxolyn 2.5 mg daily for two days 30 min. Before your lasix  Your physician recommends that you schedule a follow-up appointment in: one week with an extender

## 2014-03-12 NOTE — Telephone Encounter (Signed)
Lm with female person that pt called about her CT in august. She was given bottles of contrast to drink. She was going to return them. Message was left that she would get better test results if she drank the contrast. She can call us back later.

## 2014-03-17 ENCOUNTER — Telehealth: Payer: Self-pay | Admitting: Cardiology

## 2014-03-17 ENCOUNTER — Encounter: Payer: Self-pay | Admitting: Oncology

## 2014-03-17 NOTE — Telephone Encounter (Signed)
Pt's home aid called and wanted you to know that pt's weight was down to 227.4 but she still had at least  2 plus pitting edema in her lower extremities and was very SOB .

## 2014-03-17 NOTE — Telephone Encounter (Signed)
She needs to have a Audiological scientist.  She can take Zaroxolyn 2.5 mg x 1 for the next two days again.

## 2014-03-18 ENCOUNTER — Telehealth: Payer: Self-pay | Admitting: Cardiology

## 2014-03-18 ENCOUNTER — Encounter: Payer: Self-pay | Admitting: Cardiology

## 2014-03-18 ENCOUNTER — Encounter: Payer: Self-pay | Admitting: Radiation Oncology

## 2014-03-18 ENCOUNTER — Other Ambulatory Visit: Payer: Self-pay | Admitting: *Deleted

## 2014-03-18 ENCOUNTER — Other Ambulatory Visit: Payer: Self-pay | Admitting: Medical Oncology

## 2014-03-18 ENCOUNTER — Ambulatory Visit
Admit: 2014-03-18 | Discharge: 2014-03-18 | Disposition: A | Discharge status: Home / Self Care | Payer: Medicare HMO | Attending: Radiation Oncology | Admitting: Radiation Oncology

## 2014-03-18 VITALS — BP 118/58 | HR 83 | Temp 97.7°F | Ht 62.0 in | Wt 232.6 lb

## 2014-03-18 DIAGNOSIS — C7951 Secondary malignant neoplasm of bone: Secondary | ICD-10-CM

## 2014-03-18 DIAGNOSIS — C50919 Malignant neoplasm of unspecified site of unspecified female breast: Secondary | ICD-10-CM

## 2014-03-18 DIAGNOSIS — I509 Heart failure, unspecified: Secondary | ICD-10-CM

## 2014-03-18 MED ORDER — METOLAZONE 2.5 MG PO TABS: 2.5000 mg | ORAL_TABLET | Freq: Every day | ORAL | Status: DC

## 2014-03-18 NOTE — Progress Notes (Signed)
Radiation Oncology         [phone] ________________________________  Name: Anita [name] MRN: [id number]  Date: 03/18/2014  DOB: [birth year]  Follow-Up Visit Note  CC: [name], DO  Thornell Sartorius, MD  Diagnosis:   Metastatic breast cancer  Interval Since Last Radiation:  3  weeks  Narrative:  The patient returns today for routine follow-up.  She denies any pain in her upper leg area as where she received treatment. She has started walking with the assistance of a cane. She denies any pain along her right arm. She continues to have a cast in place along her right upper arm. She denies any pain in her left arm.                              ALLERGIES:  is allergic to omnipaque and benzene.  Meds: Current Outpatient Prescriptions  Medication Sig Dispense Refill  . albuterol (PROVENTIL HFA;VENTOLIN HFA) 108 (90 BASE) MCG/ACT inhaler Inhale 2 puffs into the lungs 2 (two) times daily as needed for wheezing or shortness of breath.       [name] aspirin EC 81 MG EC tablet Take 1 tablet (81 mg total) by mouth daily.  30 tablet  3  . atorvastatin (LIPITOR) 40 MG tablet Take 1 tablet (40 mg total) by mouth daily at 6 PM.  30 tablet  0  . clotrimazole (LOTRIMIN) 1 % cream Apply 1 application topically 2 (two) times daily.  30 g  0  . furosemide (LASIX) 40 MG tablet Take 80 mg by mouth daily.       . isosorbide mononitrate (IMDUR) 30 MG 24 hr tablet Take 60 mg by mouth daily.      [name] letrozole (FEMARA) 2.5 MG tablet Take 1 tablet (2.5 mg total) by mouth daily.  90 tablet  3  . metolazone (ZAROXOLYN) 2.5 MG tablet Take 1 tablet (2.5 mg total) by mouth daily. Take once a day 30 minutes before your lasix for the next two days  2 tablet  0  . mirtazapine (REMERON) 15 MG tablet Take 1 tablet (15 mg total) by mouth at bedtime.  30 tablet  1  . naproxen sodium (ANAPROX) 220 MG tablet Take 220 mg by mouth daily as needed (for pain).      . nitroGLYCERIN (NITROSTAT) 0.4 MG SL tablet Place 1 tablet (0.4  mg total) under the tongue every 5 (five) minutes as needed for chest pain.  30 tablet  0  . NON FORMULARY Place 3 L into the nose as needed (oxygen).       . palbociclib (IBRANCE) 125 MG capsule Take one capsule by mouth with breakfast for 21 days. Rest 7 days then repeat.  21 capsule  0  . potassium chloride SA (K-DUR,KLOR-CON) 20 MEQ tablet Take 1-2 tablets (20-40 mEq total) by mouth 2 (two) times daily. 2 tablets (40 mEq) AM and 1 tablet ( 20 mEq) PM      . tiotropium (SPIRIVA) 18 MCG inhalation capsule Place 1 capsule (18 mcg total) into inhaler and inhale daily.  30 capsule  6  . VITAMIN D, CHOLECALCIFEROL, PO Take 1,000 mg by mouth daily.       [name] doxycycline (VIBRA-TABS) 100 MG tablet Take 1 tablet (100 mg total) by mouth 2 (two) times daily.  20 tablet  0  . HYDROcodone-acetaminophen (NORCO/VICODIN) 5-325 MG per tablet Take 2 tablets by mouth every 4 (four)  hours as needed for moderate pain.  30 tablet  0   No current facility-administered medications for this encounter.    Physical Findings: The patient is in no acute distress. Patient is alert and oriented.  height is 5\' 2"  (1.575 m) and weight is 232 lb 9.6 oz (105.507 kg). Her oral temperature is 97.7 F (36.5 C). Her blood pressure is 118/58 and her pulse is 83. Her oxygen saturation is 100%. .  No palpable supraclavicular adenopathy. The lungs are clear. The heart has regular rhythm and rate. Mild hyperpigmentation changes are noted along thigh areas.  Lab Findings: Lab Results  Component Value Date   WBC 2.5* 03/04/2014   HGB 8.5* 03/04/2014   HCT 26.1* 03/04/2014   MCV 81.6 03/04/2014   PLT 199 03/04/2014      Radiographic Findings: No results found.  Impression:  The patient is recovering from the effects of radiation.  No pain in her extremities at this time. She admits to having a left humerus x-ray and I requested a copy of this report from Dr. Jackalyn Lombard office  Plan:  Routine followup in 3  months.  ____________________________________ Blair Promise, MD

## 2014-03-18 NOTE — Telephone Encounter (Signed)
Home health nurse called and informed of orders from Dr. Percival Spanish to do two more days of metalazone 2.5 mg and to get bmet today

## 2014-03-18 NOTE — Telephone Encounter (Signed)
Sodium              146 Potassium          3.0 Chloride              105 co2                      34 Glucose              136 BUN                     45 Creatinine            1.50 Calcium               9.4    These are from today @ 12:10pm  I'm not sure why they haven't populated to epic yet, I'll find out

## 2014-03-18 NOTE — Telephone Encounter (Signed)
I will take care of it.

## 2014-03-18 NOTE — Telephone Encounter (Signed)
Returned call to patient she wanted to know results of lab work she had done this morning.Advised results not available.Advised Dr.Hochrein out of office,his nurse Gans will call back when results are available.

## 2014-03-18 NOTE — Progress Notes (Signed)
Anita [name] here for follow up after treatment to her left and right femurs.  She denies pain and is ambulating with a cane at home.  She denies fatigue.  The skin on her upper legs is intact.  She continues to have edema in both lower legs and feet. She is wondering if she can have radiation to her right arm.

## 2014-03-18 NOTE — Telephone Encounter (Signed)
Pt called back with results of her bloodwork

## 2014-03-18 NOTE — Telephone Encounter (Signed)
She should take an extra 80 meq of potassium.  I need to have the labs drawn again on Friday and for somebody to call me with the results.

## 2014-03-18 NOTE — Telephone Encounter (Signed)
Mrs. Delph is calling to get the results of her lab work .[name] Please call  Thanks

## 2014-03-19 ENCOUNTER — Encounter: Payer: Self-pay | Admitting: Cardiology

## 2014-03-22 ENCOUNTER — Telehealth: Payer: Self-pay | Admitting: Cardiology

## 2014-03-22 NOTE — Telephone Encounter (Signed)
Have her take an extra 40 meq today.  Ask her how she is breathing.  If she is no better she will needt to be admitted.

## 2014-03-22 NOTE — Telephone Encounter (Signed)
JC a note was sent to Dr. Percival Spanish with this patient's K+

## 2014-03-22 NOTE — Telephone Encounter (Signed)
[facility] nurse called to inform that patient's weight today is 226 lb. Her k+ today is 3.2 up from 3.0 she takes 40 mEq of K+ in the AM  And 20 mEq in the PM. The nurse states that she has been speaking with JC and labs were drawn on Friday and was faxed over to the office today. Requests orders for patient. Message deferred to Dr. Percival Spanish for review and recommendations

## 2014-03-22 NOTE — Telephone Encounter (Signed)
Patient would like lab results, please.

## 2014-03-23 NOTE — Telephone Encounter (Signed)
Anita [name] at [place] to give an extra 40 mg to get her potassium up to at least 3.5 or above . Home health stated her breathing was better

## 2014-03-24 ENCOUNTER — Telehealth: Payer: Self-pay | Admitting: Family Medicine

## 2014-03-24 ENCOUNTER — Ambulatory Visit (INDEPENDENT_AMBULATORY_CARE_PROVIDER_SITE_OTHER): Payer: Commercial Managed Care - HMO | Admitting: Family Medicine

## 2014-03-24 VITALS — BP 102/67 | HR 85 | Temp 98.2°F | Ht 62.0 in | Wt 229.0 lb

## 2014-03-24 DIAGNOSIS — L8992 Pressure ulcer of unspecified site, stage 2: Secondary | ICD-10-CM

## 2014-03-24 DIAGNOSIS — L8990 Pressure ulcer of unspecified site, unspecified stage: Secondary | ICD-10-CM

## 2014-03-24 DIAGNOSIS — E119 Type 2 diabetes mellitus without complications: Secondary | ICD-10-CM

## 2014-03-24 DIAGNOSIS — I509 Heart failure, unspecified: Secondary | ICD-10-CM

## 2014-03-24 DIAGNOSIS — L98499 Non-pressure chronic ulcer of skin of other sites with unspecified severity: Secondary | ICD-10-CM | POA: Insufficient documentation

## 2014-03-24 DIAGNOSIS — I5042 Chronic combined systolic (congestive) and diastolic (congestive) heart failure: Secondary | ICD-10-CM

## 2014-03-24 LAB — POCT GLYCOSYLATED HEMOGLOBIN (HGB A1C): HEMOGLOBIN A1C: 6.5

## 2014-03-24 MED ORDER — TORSEMIDE 20 MG PO TABS: 40.0000 mg | ORAL_TABLET | Freq: Every day | ORAL | Status: DC

## 2014-03-24 MED ORDER — CLOTRIMAZOLE 1 % EX CREA: 1.0000 "application " | TOPICAL_CREAM | Freq: Two times a day (BID) | CUTANEOUS | Status: DC

## 2014-03-24 NOTE — Progress Notes (Signed)
   Subjective:    Patient ID: Anita [name], female    DOB: [birth year], 68 y.o.   MRN: [id number]  HPI 68 year old female with a complex PMH including metastatic breast cancer, chronic combined CHF, COPD and chronic respiratory failure on home O2, DM 2, CAD status post NSTEMI x 3 who presents for follow up.  Concerns/issues today:  1) Sacral wound - Patient recently developed a sacral wound. - She reports that her daughter is concerned about it would like examined today. - She has home health care and nurses have been attending to the wound.  She is unclear Whether or not she has had wound care evaluate it. - She denies any pain, erythema, drainage from the wound.  Is located on her right buttock. - Patient requesting more PCS hours today.  2) Chronic combined CHF - Patient with recent increasing LE edema.  - She was recently seen by Cardiology on 7/17 with increased weight (235 lb; dry weight ~ 228 lb) and severe LE edema. - She presents today with continued LE edema.  - SOB is slightly worse per her report. - No PND or Orthopnea (but she has been sleeping in a chair secondary to humerus fracture). No chest pain.  Review of Systems Per HPI    Objective:   Physical Exam Filed Vitals:   03/24/14 1418  BP: 102/67  Pulse: 85  Temp: 98.2 F (36.8 C)   Exam: General: chronically ill appearing female; no increased SOB/work of breathing. Cardiovascular: RRR. No murmurs, rubs, or gallops. Respiratory: Bibasilar rales noted.  Extremities: 3+ pitting LE edema extending to the knee. Skin: Small nickel sized stage II pressure ulcer noted on the right buttock.    Assessment & Plan:  See Problem List

## 2014-03-24 NOTE — Patient Instructions (Signed)
It was asked to see you today.  Below is what we have talked about today  1) Edema/heart failure - Switched you back to torsemide.  Take 40 mg daily. - Follow up early next week for reassessment. - Weigh yourself EVERY morning after you go to the bathroom but before you eat or drink anything. Write this number down in a weight log/diary. If you gain 3 pounds overnight or 5 pounds in a week, call Dr. Percival Spanish.  2) Wound - Apply the dressings - I will ensure that wound care comes by.  3) Yeast - Cream refilled  Follow up early next week.

## 2014-03-24 NOTE — Assessment & Plan Note (Signed)
Volume overloaded on exam but patient is nearly at dry weight. Current weight today 229 lbs. Switching to Torsemide 40 mg today as this has worked better than PO Lasix (likely due to underlying gut edema and poor absorption). Advised daily weights and follow up with me early next week.

## 2014-03-24 NOTE — Assessment & Plan Note (Signed)
Dressings given today.  Will ensure that home health nursing is addressing; will see if I can arrange wound care at home.

## 2014-03-24 NOTE — Telephone Encounter (Signed)
Nurse for Anita [name] called regarding orders patient told her Dr. Lacinda Axon wanted to give her.  Please call her back to advise

## 2014-03-25 NOTE — Telephone Encounter (Signed)
Left detailed message for nurse Cindy,no order per Dr Lacinda Axon are needed at this time.patient informed yesterday.requested a return call if still having any questions.Pamelyn Bancroft, Lewie Loron

## 2014-03-26 ENCOUNTER — Ambulatory Visit: Payer: Commercial Managed Care - HMO | Admitting: Cardiology

## 2014-03-29 ENCOUNTER — Telehealth: Payer: Self-pay | Admitting: Cardiology

## 2014-03-29 ENCOUNTER — Other Ambulatory Visit: Payer: Self-pay | Admitting: Family Medicine

## 2014-03-29 ENCOUNTER — Telehealth: Payer: Self-pay | Admitting: Family Medicine

## 2014-03-29 DIAGNOSIS — S31000A Unspecified open wound of lower back and pelvis without penetration into retroperitoneum, initial encounter: Secondary | ICD-10-CM

## 2014-03-29 NOTE — Telephone Encounter (Signed)
[facility] After hours emergency line  Pt calling regarding continued shortness of breath which has been going on since her last visit with Dr. Lacinda Axon on 7/29; she was switched to torsemide 40mg  at that time which she has been taking. She states her weight is up to 238lbs today. She has been able to walk around and do errands, and has oxygen at home. She denies any chest pain. She will be seeing one of her doctors tomorrow, and has her home nurse coming on Wednesday for which she requests the [facility] calls her to order IV lasix [phone] Laney Pastor). I informed her that if she is getting enough urine out with the torsemide this wouldn't be necessary, but she continues to believe that she will eventually need IV lasix. I discussed that if she is having shortness of breath with not enough urine output she can take an additional 20mg  torsemide tonight, but that she should come to the ED if she worsens; she verbalizes understanding and is in agreement with plan, but again asks that she have someone from clinic call her home nurse for IV lasix order.  Tawanna Sat, MD 03/29/2014, 6:17 PM PGY-2, [place]

## 2014-03-29 NOTE — Telephone Encounter (Signed)
Returned call to [place]. Patient's current weight is 238.8 and was 234lbs yesterday. Last OV 7/17 was 235lbs.   Patient went to PCP last week, Dr. Lacinda Axon, who stopped lasix 80mg  BID and started on torsemide 20mg  BID (total of 40mg ).   Patient is short of breath with minimal exertion.   Message forwarded to [place] (who Jenny Reichmann states they normally talk with) and Dr. Percival Spanish to advise.

## 2014-03-30 ENCOUNTER — Telehealth: Payer: Self-pay

## 2014-03-30 ENCOUNTER — Encounter: Payer: Self-pay | Admitting: Clinical

## 2014-03-30 NOTE — Telephone Encounter (Signed)
Pt called earlier about CT contrast. LVM that Dr Juliann Mule ordered w/o contrast. The results are clearer with contrast. Call back or call radiology with any further questions.

## 2014-03-30 NOTE — Telephone Encounter (Signed)
Left voice message for pt to return nurse call regarding note from Dr. Lacinda Axon, pt needs to go to ED is symptoms are worsen.  Derl Barrow, RN

## 2014-03-30 NOTE — Telephone Encounter (Signed)
I would not be comfortable with home IV lasix as we are not able to monitor renal function and I/O's. If patient's SOB and swelling is worse, she needs to go to the ED and then be admitted for IV diuresis.

## 2014-03-30 NOTE — Telephone Encounter (Signed)
Pt.s home health nurse stated pt was wheezing and was more sob with minimal exertion and had been changed from lasix to torsemide  Also pt had gained 5 lbs recently, nurse pulse ox was 97 % on room air pt.02 @2Liters  prn I told nurse if pulse ox drops or doesn't maintain at least 92% to let us know but pt has appt on Thursday with [place] PA, home health nurse agreed with plan .

## 2014-03-30 NOTE — Telephone Encounter (Signed)
Pt left a voice message on nurse stating that her home health nurse is going to call her PCP for orders for IV Lasix due to increase edema of legs and her being SOB.  She did not want any one to call her back at this time until later today; she was leaving for an appt with her Orthopedic doctor. Will forward to PCP. Derl Barrow, RN

## 2014-03-30 NOTE — Progress Notes (Signed)
CSW has faxed order for a home health RN to assess pt's wound care. Order sent to [facility] per PCP request as pt is being followed by agency already.  Hunt Oris, MSW, [place]

## 2014-03-31 ENCOUNTER — Other Ambulatory Visit: Payer: Self-pay | Admitting: Physician Assistant

## 2014-03-31 ENCOUNTER — Other Ambulatory Visit: Payer: Self-pay | Admitting: *Deleted

## 2014-03-31 NOTE — Telephone Encounter (Signed)
Per Awilda Metro, PA: Best to hold on refill until MD is back in office and CT results available.

## 2014-03-31 NOTE — Telephone Encounter (Signed)
Refill request for Ibrance 125 mg capsules to MD desk for approval.

## 2014-03-31 NOTE — Telephone Encounter (Signed)
Nurse called pt today regarding not from Dr. Lacinda Axon.  Pt informed that she did receive message and that she is feeling okay, but has gained 10 lbs since starting the Torsemide.  Will forward to PCP.  Derl Barrow, RN

## 2014-04-01 ENCOUNTER — Encounter (HOSPITAL_COMMUNITY): Payer: Self-pay | Admitting: *Deleted

## 2014-04-01 ENCOUNTER — Inpatient Hospital Stay (HOSPITAL_COMMUNITY)
Admission: AD | Admit: 2014-04-01 | Discharge: 2014-04-10 | DRG: 292 | Disposition: A | Discharge status: Home / Self Care | Payer: Medicare HMO | Attending: Cardiology | Admitting: Cardiology

## 2014-04-01 ENCOUNTER — Ambulatory Visit (INDEPENDENT_AMBULATORY_CARE_PROVIDER_SITE_OTHER): Payer: Commercial Managed Care - HMO | Admitting: Cardiology

## 2014-04-01 ENCOUNTER — Encounter: Payer: Self-pay | Admitting: Cardiology

## 2014-04-01 VITALS — BP 123/64 | HR 80 | Ht 62.0 in | Wt 240.7 lb

## 2014-04-01 DIAGNOSIS — J383 Other diseases of vocal cords: Secondary | ICD-10-CM | POA: Diagnosis present

## 2014-04-01 DIAGNOSIS — N183 Chronic kidney disease, stage 3 unspecified: Secondary | ICD-10-CM | POA: Diagnosis present

## 2014-04-01 DIAGNOSIS — N182 Chronic kidney disease, stage 2 (mild): Secondary | ICD-10-CM

## 2014-04-01 DIAGNOSIS — C50919 Malignant neoplasm of unspecified site of unspecified female breast: Secondary | ICD-10-CM | POA: Diagnosis present

## 2014-04-01 DIAGNOSIS — J4489 Other specified chronic obstructive pulmonary disease: Secondary | ICD-10-CM | POA: Diagnosis present

## 2014-04-01 DIAGNOSIS — I5042 Chronic combined systolic (congestive) and diastolic (congestive) heart failure: Secondary | ICD-10-CM

## 2014-04-01 DIAGNOSIS — I5033 Acute on chronic diastolic (congestive) heart failure: Principal | ICD-10-CM | POA: Diagnosis present

## 2014-04-01 DIAGNOSIS — I509 Heart failure, unspecified: Secondary | ICD-10-CM

## 2014-04-01 DIAGNOSIS — E119 Type 2 diabetes mellitus without complications: Secondary | ICD-10-CM | POA: Diagnosis present

## 2014-04-01 DIAGNOSIS — J9610 Chronic respiratory failure, unspecified whether with hypoxia or hypercapnia: Secondary | ICD-10-CM | POA: Diagnosis present

## 2014-04-01 DIAGNOSIS — I5031 Acute diastolic (congestive) heart failure: Secondary | ICD-10-CM

## 2014-04-01 DIAGNOSIS — E876 Hypokalemia: Secondary | ICD-10-CM | POA: Diagnosis not present

## 2014-04-01 DIAGNOSIS — N6310 Unspecified lump in the right breast, unspecified quadrant: Secondary | ICD-10-CM | POA: Diagnosis present

## 2014-04-01 DIAGNOSIS — Z87891 Personal history of nicotine dependence: Secondary | ICD-10-CM

## 2014-04-01 DIAGNOSIS — C7952 Secondary malignant neoplasm of bone marrow: Secondary | ICD-10-CM

## 2014-04-01 DIAGNOSIS — I252 Old myocardial infarction: Secondary | ICD-10-CM

## 2014-04-01 DIAGNOSIS — I2510 Atherosclerotic heart disease of native coronary artery without angina pectoris: Secondary | ICD-10-CM | POA: Diagnosis present

## 2014-04-01 DIAGNOSIS — C7951 Secondary malignant neoplasm of bone: Secondary | ICD-10-CM | POA: Diagnosis present

## 2014-04-01 DIAGNOSIS — D638 Anemia in other chronic diseases classified elsewhere: Secondary | ICD-10-CM | POA: Diagnosis present

## 2014-04-01 DIAGNOSIS — N184 Chronic kidney disease, stage 4 (severe): Secondary | ICD-10-CM | POA: Diagnosis present

## 2014-04-01 DIAGNOSIS — E785 Hyperlipidemia, unspecified: Secondary | ICD-10-CM | POA: Diagnosis present

## 2014-04-01 DIAGNOSIS — J449 Chronic obstructive pulmonary disease, unspecified: Secondary | ICD-10-CM | POA: Diagnosis present

## 2014-04-01 DIAGNOSIS — I5032 Chronic diastolic (congestive) heart failure: Secondary | ICD-10-CM | POA: Diagnosis present

## 2014-04-01 DIAGNOSIS — R062 Wheezing: Secondary | ICD-10-CM | POA: Diagnosis not present

## 2014-04-01 LAB — COMPREHENSIVE METABOLIC PANEL
ALBUMIN: 3.3 g/dL — AB (ref 3.5–5.2)
ALK PHOS: 113 U/L (ref 39–117)
ALT: 42 U/L — ABNORMAL HIGH (ref 0–35)
ANION GAP: 14 (ref 5–15)
AST: 43 U/L — ABNORMAL HIGH (ref 0–37)
BILIRUBIN TOTAL: 0.3 mg/dL (ref 0.3–1.2)
BUN: 26 mg/dL — ABNORMAL HIGH (ref 6–23)
CHLORIDE: 101 meq/L (ref 96–112)
CO2: 27 mEq/L (ref 19–32)
Calcium: 8.7 mg/dL (ref 8.4–10.5)
Creatinine, Ser: 1.37 mg/dL — ABNORMAL HIGH (ref 0.50–1.10)
GFR calc Af Amer: 45 mL/min — ABNORMAL LOW (ref >90)
GFR, EST NON AFRICAN AMERICAN: 39 mL/min — AB (ref >90)
Glucose, Bld: 179 mg/dL — ABNORMAL HIGH (ref 70–99)
POTASSIUM: 4 meq/L (ref 3.7–5.3)
Sodium: 142 mEq/L (ref 137–147)
Total Protein: 6.5 g/dL (ref 6.0–8.3)

## 2014-04-01 LAB — CBC
HEMATOCRIT: 27.1 % — AB (ref 36.0–46.0)
HEMOGLOBIN: 8.7 g/dL — AB (ref 12.0–15.0)
MCH: 28.5 pg (ref 26.0–34.0)
MCHC: 32.1 g/dL (ref 30.0–36.0)
MCV: 88.9 fL (ref 78.0–100.0)
Platelets: 189 10*3/uL (ref 150–400)
RBC: 3.05 MIL/uL — ABNORMAL LOW (ref 3.87–5.11)
RDW: 24.6 % — AB (ref 11.5–15.5)
WBC: 2.7 10*3/uL — ABNORMAL LOW (ref 4.0–10.5)

## 2014-04-01 LAB — PRO B NATRIURETIC PEPTIDE: Pro B Natriuretic peptide (BNP): 588.7 pg/mL — ABNORMAL HIGH (ref 0–125)

## 2014-04-01 MED ORDER — SODIUM CHLORIDE 0.9 % IJ SOLN: 3.0000 mL | INTRAMUSCULAR | Status: DC | PRN

## 2014-04-01 MED ORDER — ONDANSETRON HCL 4 MG/2ML IJ SOLN: 4.0000 mg | Freq: Four times a day (QID) | INTRAMUSCULAR | Status: DC | PRN

## 2014-04-01 MED ORDER — ASPIRIN EC 81 MG PO TBEC
81.0000 mg | DELAYED_RELEASE_TABLET | Freq: Every day | ORAL | Status: DC
  Administered 2014-04-02 – 2014-04-10 (×9): 81 mg via ORAL
  Filled 2014-04-01 (×10): qty 1

## 2014-04-01 MED ORDER — SODIUM CHLORIDE 0.9 % IJ SOLN
3.0000 mL | Freq: Two times a day (BID) | INTRAMUSCULAR | Status: DC
  Administered 2014-04-02 – 2014-04-09 (×17): 3 mL via INTRAVENOUS

## 2014-04-01 MED ORDER — POTASSIUM CHLORIDE CRYS ER 20 MEQ PO TBCR
40.0000 meq | EXTENDED_RELEASE_TABLET | Freq: Every day | ORAL | Status: DC
  Administered 2014-04-02 – 2014-04-03 (×2): 40 meq via ORAL
  Filled 2014-04-01 (×2): qty 2

## 2014-04-01 MED ORDER — FUROSEMIDE 10 MG/ML IJ SOLN
INTRAMUSCULAR | Status: AC
  Filled 2014-04-01: qty 8

## 2014-04-01 MED ORDER — ALBUTEROL SULFATE (2.5 MG/3ML) 0.083% IN NEBU: 3.0000 mL | INHALATION_SOLUTION | Freq: Two times a day (BID) | RESPIRATORY_TRACT | Status: DC | PRN

## 2014-04-01 MED ORDER — ATORVASTATIN CALCIUM 40 MG PO TABS
40.0000 mg | ORAL_TABLET | Freq: Every day | ORAL | Status: DC
  Administered 2014-04-02 – 2014-04-10 (×9): 40 mg via ORAL
  Filled 2014-04-01 (×9): qty 1

## 2014-04-01 MED ORDER — MIRTAZAPINE 15 MG PO TABS
15.0000 mg | ORAL_TABLET | Freq: Every day | ORAL | Status: DC
  Administered 2014-04-01 – 2014-04-09 (×9): 15 mg via ORAL
  Filled 2014-04-01 (×11): qty 1

## 2014-04-01 MED ORDER — TIOTROPIUM BROMIDE MONOHYDRATE 18 MCG IN CAPS
18.0000 ug | ORAL_CAPSULE | Freq: Every day | RESPIRATORY_TRACT | Status: DC
  Administered 2014-04-02 – 2014-04-10 (×6): 18 ug via RESPIRATORY_TRACT
  Filled 2014-04-01 (×2): qty 5

## 2014-04-01 MED ORDER — ACETAMINOPHEN 325 MG PO TABS: 650.0000 mg | ORAL_TABLET | ORAL | Status: DC | PRN

## 2014-04-01 MED ORDER — ISOSORBIDE MONONITRATE ER 60 MG PO TB24
60.0000 mg | ORAL_TABLET | Freq: Every day | ORAL | Status: DC
  Administered 2014-04-02 – 2014-04-10 (×9): 60 mg via ORAL
  Filled 2014-04-01 (×9): qty 1

## 2014-04-01 MED ORDER — NITROGLYCERIN 0.4 MG SL SUBL
0.4000 mg | SUBLINGUAL_TABLET | SUBLINGUAL | Status: DC | PRN
  Filled 2014-04-01: qty 1

## 2014-04-01 MED ORDER — SODIUM CHLORIDE 0.9 % IV SOLN: 250.0000 mL | INTRAVENOUS | Status: DC | PRN

## 2014-04-01 MED ORDER — FUROSEMIDE 10 MG/ML IJ SOLN
80.0000 mg | Freq: Once | INTRAMUSCULAR | Status: AC
  Administered 2014-04-01: 80 mg via INTRAVENOUS
  Filled 2014-04-01: qty 8

## 2014-04-01 MED ORDER — HEPARIN SODIUM (PORCINE) 5000 UNIT/ML IJ SOLN
5000.0000 [IU] | Freq: Three times a day (TID) | INTRAMUSCULAR | Status: DC
  Filled 2014-04-01 (×28): qty 1

## 2014-04-01 MED ORDER — POTASSIUM CHLORIDE CRYS ER 20 MEQ PO TBCR
20.0000 meq | EXTENDED_RELEASE_TABLET | Freq: Every day | ORAL | Status: DC
  Administered 2014-04-01: 20 meq via ORAL
  Filled 2014-04-01 (×2): qty 1

## 2014-04-01 NOTE — Progress Notes (Signed)
Patient ID: Anita [name], female   DOB: [birth year], 68 y.o.   MRN: [id number]    04/01/2014 Anita [name]   [birth year]  [id number]  Primary [place], Jayce, DO Primary Cardiologist: Hochrein  HPI:  The patient is a 68 year old female, followed by Dr. Percival Spanish with multiple medical problems, which include a h/o CAD s/p NSTEMI in 2014 (details listed below in [place]), COPD, obesity, HTN, DM and breast cancer with bone metastasis. She presents to clinic for evaluation of her diastolic heart failure. She was recently admitted to [facility] at the end of June for acute on chronic diastolic heart failure and was treated with IV diuretics. A 2-D echocardiogram revealed normal left ventricular systolic function with an ejection fraction of 55%. She was discharged home with a dry weight of 228 pounds. She was sent home on PO Lasix twice a day. Since discharge, she has had serial increases in her weight, despite reported medication compliance and adherence to a low-sodium diet. She was seen by Dr. Percival Spanish for  followup on 04/01/2014. At that time, her weight had increased by 7 pounds beyond her dry weight. She also noted dyspnea on exertion. He increased her Lasix to 80 mg as well as added Zaroxolyn as an adjunct. Despite this, she had no improvement in symptoms and no increased diuresis. Her weight continued to slowly increase. She was evaluated by her primary care provider, Dr. Lacinda Axon, on 03/24/2014. At that time her weight had further increased. Dr. Lacinda Axon switched her diuretic from Lasix to torsemide as it was felt that underlying gut edema was leading to poor absorption. She was placed on 40 mg daily. Zaroxolyn was also discontinued.   She reports back to clinic today with complaints of continued weight gain and increasing dyspnea on exertion. Weight today is 240 pounds and she has significant peripheral edema. She reports full medication compliance and also reports adherence to low-sodium diet.  Past  Medical History  Diagnosis Date  . Fibroid   . Morbid obesity   . 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 COPD (chronic obstructive pulmonary disease)   . Chronic diastolic CHF (congestive heart failure)   . NSTEMI (non-ST elevated myocardial infarction)     Cath November 2014 LAD 40% stenosis, first diagonal 80% stenosis, circumflex 20% stenosis, right coronary artery occluded. The EF was 35-40% at that time.  . Anemia     a. Adm 09/2012 with melena, Hgb 5.8 -> transfused. EGD/colonoscopy unrevealing.  . Breast cancer     a. Mets to bone. ER 100%/ PR 0%/Her2 neu negative.  [name] Ulcers of both lower legs   . Tobacco abuse   . Hyperlipidemia 02/11/2013  . Chronic respiratory failure     a. On O2 qhs. also portable O2.  . Chronic renal insufficiency   . Lesion of vocal cord     a. CT 05/2013 concerning for tumor.  . Shortness of breath   . Depression   . Diabetes mellitus without complication   . Anginal pain   . Dementia     very mild  . Radiation 02/02/14-02/24/14    left and right femur 30 gray       Current Outpatient Prescriptions  Medication Sig Dispense Refill  . albuterol (PROVENTIL HFA;VENTOLIN HFA) 108 (90 BASE) MCG/ACT inhaler Inhale 2 puffs into the lungs 2 (two) times daily as needed for wheezing or shortness of breath.       [name]  aspirin EC 81 MG EC tablet Take 1 tablet (81 mg total) by mouth daily.  30 tablet  3  . atorvastatin (LIPITOR) 40 MG tablet Take 1 tablet (40 mg total) by mouth daily at 6 PM.  30 tablet  0  . clotrimazole (LOTRIMIN) 1 % cream Apply 1 application topically 2 (two) times daily.  60 g  1  . HYDROcodone-acetaminophen (NORCO/VICODIN) 5-325 MG per tablet Take 2 tablets by mouth every 4 (four) hours as needed for moderate pain.  30 tablet  0  . isosorbide mononitrate (IMDUR) 30 MG 24 hr tablet Take 60 mg by mouth daily.      [name] letrozole (FEMARA) 2.5 MG tablet Take 1 tablet (2.5 mg total) by mouth daily.  90 tablet  3    . mirtazapine (REMERON) 15 MG tablet Take 1 tablet (15 mg total) by mouth at bedtime.  30 tablet  1  . naproxen sodium (ANAPROX) 220 MG tablet Take 220 mg by mouth daily as needed (for pain).      . nitroGLYCERIN (NITROSTAT) 0.4 MG SL tablet Place 1 tablet (0.4 mg total) under the tongue every 5 (five) minutes as needed for chest pain.  30 tablet  0  . NON FORMULARY Place 3 L into the nose as needed (oxygen).       . palbociclib (IBRANCE) 125 MG capsule Take one capsule by mouth with breakfast for 21 days. Rest 7 days then repeat.  21 capsule  0  . potassium chloride SA (K-DUR,KLOR-CON) 20 MEQ tablet Take 1-2 tablets (20-40 mEq total) by mouth 2 (two) times daily. 2 tablets (40 mEq) AM and 1 tablet ( 20 mEq) PM      . tiotropium (SPIRIVA) 18 MCG inhalation capsule Place 1 capsule (18 mcg total) into inhaler and inhale daily.  30 capsule  6  . torsemide (DEMADEX) 20 MG tablet Take 2 tablets (40 mg total) by mouth daily.  60 tablet  3  . VITAMIN D, CHOLECALCIFEROL, PO Take 1,000 mg by mouth daily.        No current facility-administered medications for this visit.    Allergies  Allergen Reactions  . Omnipaque [Iohexol]     Pt claims she developed hives after given contrast  . Benzene Rash    History   Social History  . Marital Status: Widowed    Spouse Name: N/A    Number of Children: 2  . Years of Education: N/A   Occupational History  . retired Energy manager    Social History Main Topics  . Smoking status: Former Smoker -- 2.00 packs/day for 40 years    Types: Cigarettes    Quit date: 09/24/2012  . Smokeless tobacco: Never Used  . Alcohol Use: No  . Drug Use: No  . Sexual Activity: No   Other Topics Concern  . Not on file   Social History Narrative   Lives with husband [name] [phone]; Daughter who would like to make medical decisions is [name] [phone]. Patient in school at [employer] for counseling degree.      Review of Systems: General: negative for  chills, fever, night sweats or weight changes.  Cardiovascular: negative for chest pain, dyspnea on exertion, edema, orthopnea, palpitations, paroxysmal nocturnal dyspnea or shortness of breath Dermatological: negative for rash Respiratory: negative for cough or wheezing Urologic: negative for hematuria Abdominal: negative for nausea, vomiting, diarrhea, bright red blood per rectum, melena, or hematemesis Neurologic: negative for visual changes, syncope, or dizziness  All other systems reviewed and are otherwise negative except as noted above.    Blood pressure 123/64, pulse 80, height $RemoveBe'5\' 2"'jrrZEYKBK$  (1.575 m), weight 240 lb 11.2 oz (109.181 kg).  General appearance: alert, cooperative and no distress Neck: no carotid bruit and + JVD Lungs: bibasilar crackles Heart: regular rate and rhythm Extremities: 2+ bilateral edema Pulses: 2+ bilaterally Skin: warm and dry Neurologic: Grossly normal   ASSESSMENT AND PLAN:   1. Acute on chronic diastolic heart failure: The patient has failed high-dose PO Lasix therapy with Zaroxolyn as well as PO torsemide. She is 12 pounds above her dry weight and her weight has steadily been increasing despite reported medication compliance and adherence to low-sodium diet. She has significant peripheral edema on physical exam as well as bibasilar pulmonary rales. I have discuss case with Dr. Sallyanne Kuster, office DOD, and he agrees that the patient will benefit from admission for IV diuretic therapy. Will admit to telemetry. We'll check a BNP. Will initiate IV Lasix twice a day. Daily weights, low-sodium diet and daily BMPs to monitor renal function and electrolytes.     Sharnika Binney, BRITTAINYPA-C 04/01/2014 1:42 PM

## 2014-04-01 NOTE — Patient Instructions (Signed)
GO TO ADMITTING DEPARTMENT AT [facility] WILL HAVE  A BED ON THE TELEMETRY UNIT.

## 2014-04-02 ENCOUNTER — Observation Stay (HOSPITAL_COMMUNITY): Payer: Medicare HMO

## 2014-04-02 ENCOUNTER — Telehealth: Payer: Self-pay | Admitting: Internal Medicine

## 2014-04-02 ENCOUNTER — Other Ambulatory Visit: Payer: Self-pay

## 2014-04-02 DIAGNOSIS — C50919 Malignant neoplasm of unspecified site of unspecified female breast: Secondary | ICD-10-CM | POA: Diagnosis present

## 2014-04-02 DIAGNOSIS — D638 Anemia in other chronic diseases classified elsewhere: Secondary | ICD-10-CM | POA: Diagnosis present

## 2014-04-02 DIAGNOSIS — R062 Wheezing: Secondary | ICD-10-CM | POA: Diagnosis not present

## 2014-04-02 DIAGNOSIS — C7951 Secondary malignant neoplasm of bone: Secondary | ICD-10-CM | POA: Diagnosis present

## 2014-04-02 DIAGNOSIS — J9610 Chronic respiratory failure, unspecified whether with hypoxia or hypercapnia: Secondary | ICD-10-CM | POA: Diagnosis present

## 2014-04-02 DIAGNOSIS — I5033 Acute on chronic diastolic (congestive) heart failure: Secondary | ICD-10-CM | POA: Diagnosis present

## 2014-04-02 DIAGNOSIS — R0602 Shortness of breath: Secondary | ICD-10-CM | POA: Diagnosis present

## 2014-04-02 DIAGNOSIS — J449 Chronic obstructive pulmonary disease, unspecified: Secondary | ICD-10-CM | POA: Diagnosis present

## 2014-04-02 DIAGNOSIS — I5031 Acute diastolic (congestive) heart failure: Secondary | ICD-10-CM

## 2014-04-02 DIAGNOSIS — I2510 Atherosclerotic heart disease of native coronary artery without angina pectoris: Secondary | ICD-10-CM | POA: Diagnosis present

## 2014-04-02 DIAGNOSIS — J4489 Other specified chronic obstructive pulmonary disease: Secondary | ICD-10-CM | POA: Diagnosis not present

## 2014-04-02 DIAGNOSIS — I252 Old myocardial infarction: Secondary | ICD-10-CM | POA: Diagnosis not present

## 2014-04-02 DIAGNOSIS — E785 Hyperlipidemia, unspecified: Secondary | ICD-10-CM | POA: Diagnosis present

## 2014-04-02 DIAGNOSIS — E876 Hypokalemia: Secondary | ICD-10-CM | POA: Diagnosis not present

## 2014-04-02 DIAGNOSIS — E119 Type 2 diabetes mellitus without complications: Secondary | ICD-10-CM | POA: Diagnosis present

## 2014-04-02 DIAGNOSIS — N183 Chronic kidney disease, stage 3 unspecified: Secondary | ICD-10-CM | POA: Diagnosis present

## 2014-04-02 LAB — BASIC METABOLIC PANEL
Anion gap: 13 (ref 5–15)
BUN: 25 mg/dL — ABNORMAL HIGH (ref 6–23)
CHLORIDE: 102 meq/L (ref 96–112)
CO2: 27 mEq/L (ref 19–32)
CREATININE: 1.43 mg/dL — AB (ref 0.50–1.10)
Calcium: 9 mg/dL (ref 8.4–10.5)
GFR calc Af Amer: 43 mL/min — ABNORMAL LOW (ref >90)
GFR calc non Af Amer: 37 mL/min — ABNORMAL LOW (ref >90)
GLUCOSE: 174 mg/dL — AB (ref 70–99)
POTASSIUM: 3.8 meq/L (ref 3.7–5.3)
Sodium: 142 mEq/L (ref 137–147)

## 2014-04-02 MED ORDER — POTASSIUM CHLORIDE CRYS ER 20 MEQ PO TBCR
40.0000 meq | EXTENDED_RELEASE_TABLET | Freq: Every day | ORAL | Status: DC
  Administered 2014-04-02: 40 meq via ORAL
  Filled 2014-04-02: qty 2

## 2014-04-02 MED ORDER — PALBOCICLIB 125 MG PO CAPS
125.0000 mg | ORAL_CAPSULE | Freq: Every day | ORAL | Status: AC
  Administered 2014-04-02 – 2014-04-06 (×5): 125 mg via ORAL
  Filled 2014-04-02 (×3): qty 1

## 2014-04-02 MED ORDER — FUROSEMIDE 10 MG/ML IJ SOLN
80.0000 mg | Freq: Two times a day (BID) | INTRAMUSCULAR | Status: DC
  Administered 2014-04-02 (×2): 80 mg via INTRAVENOUS
  Filled 2014-04-02 (×4): qty 8

## 2014-04-02 NOTE — Telephone Encounter (Signed)
, °

## 2014-04-02 NOTE — Progress Notes (Signed)
Pt.is A/Ox3 and is ambulatory with 1 person standby assist. She uses a bedside commode. She has oxygen at 3 L [place]. Pt's blood pressure this afternoon was low this afternoon and was 99/53. Pt.was asymptomatic. Gaspar Bidding, PA called and notified. Was told to continue to monitor patient and prior to patient receiving  dose of lasix tonight to recheck BP. Patient's blood pressure was rechecked again this afternoon and was 101/52. She is asymptomatic. Will continue to monitor.

## 2014-04-02 NOTE — Progress Notes (Signed)
Patient direct admit from MD office. Arrived to unit from ED via wheelchair. Patient alert, oriented and ambulatory with stand-by assistance.  Admission weight, vitals and assessment completed. Fall and safety plan reviewed with patient.  Night time medications administered as ordered, patient refusing Heparin SQ even after education.  Patient currently resting comfortably in recliner.  Call light and phone within reach. Will continue to monitor. Blood pressure 132/54, pulse 82, temperature 98.2 F (36.8 C), resp. rate 20, height 5\' 2"  (1.575 m), weight 110.133 kg (242 lb 12.8 oz), SpO2 100.00%. Tresa Endo

## 2014-04-02 NOTE — Evaluation (Signed)
Physical Therapy Evaluation Patient Details Name: Anita [name] MRN: [id number] DOB: [birth year] Today's Date: 04/02/2014   History of Present Illness  Patient is a 68 yo female admitted 04/01/14 with DOE, weight gain, and LE edema.  Patient with CHF exacerbation.  Patient with h/o CAD s/p NSTEMI in 2014, COPD, obesity, HTN, DM and breast cancer with bone metastasis, depression, mild dementia.  Clinical Impression  Patient presents with problems listed below.  Will benefit from acute PT to maximize functional mobility prior to discharge home.    Follow Up Recommendations Home health PT;Supervision/Assistance - 24 hour    Equipment Recommendations  None recommended by PT    Recommendations for Other Services       Precautions / Restrictions Precautions Precautions: Fall Restrictions Weight Bearing Restrictions: No      Mobility  Bed Mobility               General bed mobility comments: Patient in chair as PT entered room  Transfers Overall transfer level: Needs assistance Equipment used: Quad cane Transfers: Sit to/from Stand Sit to Stand: Min guard         General transfer comment: Patient uses safe hand placement.  Assist for balance/safety.  Ambulation/Gait Ambulation/Gait assistance: Min guard Ambulation Distance (Feet): 24 Feet Assistive device: Quad cane Gait Pattern/deviations: Step-through pattern;Decreased step length - right;Decreased step length - left;Decreased stride length;Shuffle;Wide base of support Gait velocity: Decreased Gait velocity interpretation: Below normal speed for age/gender General Gait Details: Verbal cues to stand upright and look forward during gait.  Patient with slow shuffling gait.  Somewhat unsteady.  Assist for balance/safety.  Patient with dyspnea 3/4 with gait on O2.  Stairs            Wheelchair Mobility    Modified Rankin (Stroke Patients Only)       Balance                                              Pertinent Vitals/Pain Pain Assessment: No/denies pain    Home Living Family/patient expects to be discharged to:: Private residence Living Arrangements: Spouse/significant other Available Help at Discharge: Family;Available PRN/intermittently Type of Home: House Home Access: Ramped entrance     Home Layout: One level Home Equipment: Cane - quad;Cane - single point;Bedside commode;Wheelchair - [employer] - 2 wheels;Shower seat (adjustable bed)      Prior Function Level of Independence: Independent with assistive device(s);Needs assistance   Gait / Transfers Assistance Needed: Ambulates short distances with quad cane.  ADL's / Homemaking Assistance Needed: Assist needed for bathing, dressing, meal prep, and housekeeping.        Hand Dominance   Dominant Hand: Right    Extremity/Trunk Assessment   Upper Extremity Assessment: Overall WFL for tasks assessed           Lower Extremity Assessment: Generalized weakness (Noted edema BLE's)         Communication   Communication: No difficulties  Cognition Arousal/Alertness: Awake/alert Behavior During Therapy: Flat affect Overall Cognitive Status: History of cognitive impairments - at baseline (Mild dementia)                      General Comments      Exercises        Assessment/Plan    PT Assessment Patient needs continued PT services  PT Diagnosis Difficulty  walking;Abnormality of gait;Generalized weakness   PT Problem List Decreased strength;Decreased activity tolerance;Decreased balance;Decreased mobility;Cardiopulmonary status limiting activity;Obesity  PT Treatment Interventions DME instruction;Gait training;Functional mobility training;Therapeutic activities;Therapeutic exercise;Patient/family education   PT Goals (Current goals can be found in the Care Plan section) Acute Rehab PT Goals Patient Stated Goal: To get stronger PT Goal Formulation: With patient Time For Goal  Achievement: 04/09/14 Potential to Achieve Goals: Good    Frequency Min 3X/week   Barriers to discharge Decreased caregiver support Unsure if patient has 24 hour assist.    Co-evaluation               End of Session Equipment Utilized During Treatment: Gait belt;Oxygen Activity Tolerance: Patient limited by fatigue Patient left: in chair;with call bell/phone within reach;with nursing/sitter in room           Time: 0962-8366 PT Time Calculation (min): 17 min   Charges:   PT Evaluation $Initial PT Evaluation Tier I: 1 Procedure PT Treatments $Gait Training: 8-22 mins   PT G Codes:          Despina Pole 04/02/2014, 5:54 PM Carita Pian. Sanjuana Kava, [place] Pager [phone]

## 2014-04-02 NOTE — Progress Notes (Signed)
UR completed 

## 2014-04-02 NOTE — Progress Notes (Signed)
Patient Name: Anita [name] Date of Encounter: 04/02/2014     Active Problems:   Acute diastolic HF (heart failure)    SUBJECTIVE  The patient diuresed well overnight.  She states that her breathing is better.  Her dry weight is estimated at 228.  She was admitted at 242 pounds and is now down to 238.  She denies any chest discomfort.  CURRENT MEDS . aspirin EC  81 mg Oral Daily  . atorvastatin  40 mg Oral q1800  . furosemide      . heparin  5,000 Units Subcutaneous 3 times per day  . isosorbide mononitrate  60 mg Oral Daily  . mirtazapine  15 mg Oral QHS  . potassium chloride  20 mEq Oral QHS  . potassium chloride SA  40 mEq Oral Daily  . sodium chloride  3 mL Intravenous Q12H  . tiotropium  18 mcg Inhalation Daily    OBJECTIVE  Filed Vitals:   04/01/14 2148 04/02/14 0519 04/02/14 0857  BP: 132/54 120/80   Pulse: 82 85   Temp: 98.2 F (36.8 C) 98.3 F (36.8 C)   TempSrc:  Oral   Resp: 20 18   Height: 5\' 2"  (1.575 m)    Weight: 242 lb 12.8 oz (110.133 kg) 238 lb 5.1 oz (108.1 kg)   SpO2: 100% 100% 100%    Intake/Output Summary (Last 24 hours) at 04/02/14 0913 Last data filed at 04/02/14 0507  Gross per 24 hour  Intake    460 ml  Output   2400 ml  Net  -1940 ml   Filed Weights   04/01/14 2148 04/02/14 0519  Weight: 242 lb 12.8 oz (110.133 kg) 238 lb 5.1 oz (108.1 kg)    PHYSICAL EXAM  General: Pleasant, NAD.  Obese  Neuro: Alert and oriented X 3. Moves all extremities spontaneously. Psych: Normal affect. HEENT:  Normal  Neck: Supple without bruits or JVD. Lungs:  Resp regular and unlabored, mild expiratory wheezing Heart: RRR no s3, s4, or murmurs. Abdomen: Soft, non-tender, non-distended, BS + x 4.  Extremities: No clubbing, cyanosis.  There is 2+ chronic bilateral pitting edema. DP/PT/Radials 2+ and equal bilaterally.  Accessory Clinical Findings  CBC  Recent Labs  04/01/14 2214  WBC 2.7*  HGB 8.7*  HCT 27.1*  MCV 88.9  PLT 578   Basic  Metabolic Panel  Recent Labs  04/01/14 2214 04/02/14 0510  NA 142 142  K 4.0 3.8  CL 101 102  CO2 27 27  GLUCOSE 179* 174*  BUN 26* 25*  CREATININE 1.37* 1.43*  CALCIUM 8.7 9.0   Liver Function Tests  Recent Labs  04/01/14 2214  AST 43*  ALT 42*  ALKPHOS 113  BILITOT 0.3  PROT 6.5  ALBUMIN 3.3*   No results found for this basename: LIPASE, AMYLASE,  in the last 72 hours Cardiac Enzymes No results found for this basename: CKTOTAL, CKMB, CKMBINDEX, TROPONINI,  in the last 72 hours BNP No components found with this basename: POCBNP,  D-Dimer No results found for this basename: DDIMER,  in the last 72 hours Hemoglobin A1C No results found for this basename: HGBA1C,  in the last 72 hours Fasting Lipid Panel No results found for this basename: CHOL, HDL, LDLCALC, TRIG, CHOLHDL, LDLDIRECT,  in the last 72 hours Thyroid Function Tests No results found for this basename: TSH, T4TOTAL, FREET3, T3FREE, THYROIDAB,  in the last 72 hours  TELE  Normal sinus rhythm  ECG Pending   Radiology/Studies  Chest x-ray pending  ASSESSMENT AND PLAN  1. acute on chronic diastolic heart failure.  Ejection fraction 55% by echocardiogram June 2015. 2. chronic anemia 3. diabetes mellitus.  Recent A1c is 6.5 4. chronic renal insufficiency 5. 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  Replete potassium  Signed, Darlin Coco MD

## 2014-04-02 NOTE — Plan of Care (Signed)
Problem: Phase I Progression Outcomes Goal: EF % per last Echo/documented,Core Reminder form on chart Outcome: Completed/Met Date Met:  04/02/14 EF=55%

## 2014-04-03 DIAGNOSIS — C7952 Secondary malignant neoplasm of bone marrow: Secondary | ICD-10-CM

## 2014-04-03 DIAGNOSIS — C7951 Secondary malignant neoplasm of bone: Secondary | ICD-10-CM

## 2014-04-03 DIAGNOSIS — C50919 Malignant neoplasm of unspecified site of unspecified female breast: Secondary | ICD-10-CM

## 2014-04-03 DIAGNOSIS — N182 Chronic kidney disease, stage 2 (mild): Secondary | ICD-10-CM

## 2014-04-03 LAB — BASIC METABOLIC PANEL
Anion gap: 15 (ref 5–15)
BUN: 26 mg/dL — AB (ref 6–23)
CALCIUM: 9.1 mg/dL (ref 8.4–10.5)
CO2: 27 mEq/L (ref 19–32)
Chloride: 98 mEq/L (ref 96–112)
Creatinine, Ser: 1.51 mg/dL — ABNORMAL HIGH (ref 0.50–1.10)
GFR, EST AFRICAN AMERICAN: 40 mL/min — AB (ref >90)
GFR, EST NON AFRICAN AMERICAN: 35 mL/min — AB (ref >90)
Glucose, Bld: 165 mg/dL — ABNORMAL HIGH (ref 70–99)
POTASSIUM: 3.6 meq/L — AB (ref 3.7–5.3)
SODIUM: 140 meq/L (ref 137–147)

## 2014-04-03 LAB — GLUCOSE, CAPILLARY: Glucose-Capillary: 152 mg/dL — ABNORMAL HIGH (ref 70–99)

## 2014-04-03 MED ORDER — LETROZOLE 2.5 MG PO TABS
2.5000 mg | ORAL_TABLET | Freq: Every day | ORAL | Status: DC
  Administered 2014-04-03 – 2014-04-10 (×8): 2.5 mg via ORAL
  Filled 2014-04-03 (×8): qty 1

## 2014-04-03 MED ORDER — FUROSEMIDE 10 MG/ML IJ SOLN
40.0000 mg | Freq: Two times a day (BID) | INTRAMUSCULAR | Status: DC
  Administered 2014-04-03: 40 mg via INTRAVENOUS
  Filled 2014-04-03 (×2): qty 4

## 2014-04-03 MED ORDER — POTASSIUM CHLORIDE CRYS ER 20 MEQ PO TBCR
60.0000 meq | EXTENDED_RELEASE_TABLET | Freq: Every day | ORAL | Status: DC
  Administered 2014-04-03 – 2014-04-07 (×5): 60 meq via ORAL
  Filled 2014-04-03 (×6): qty 3

## 2014-04-03 MED ORDER — POTASSIUM CHLORIDE CRYS ER 20 MEQ PO TBCR
60.0000 meq | EXTENDED_RELEASE_TABLET | Freq: Every day | ORAL | Status: DC
  Administered 2014-04-04 – 2014-04-07 (×4): 60 meq via ORAL
  Filled 2014-04-03 (×5): qty 3

## 2014-04-03 MED ORDER — FUROSEMIDE 10 MG/ML IJ SOLN
80.0000 mg | Freq: Two times a day (BID) | INTRAMUSCULAR | Status: DC
  Administered 2014-04-03: 80 mg via INTRAVENOUS

## 2014-04-03 NOTE — Progress Notes (Signed)
Patient Name: Anita [name] Date of Encounter: 04/03/2014     Active Problems:   Acute diastolic HF (heart failure)    SUBJECTIVE Breathing is better. Net negative 4.2L so far with IV lasix. Weight down to 237 today .[name] Dry weight as reported was recently 228. Creatinine is rising slowly with diuresis - now up to 1.51. Mild hypokalemia today.  ProBNP was only 588 on admission.  CURRENT MEDS . aspirin EC  81 mg Oral Daily  . atorvastatin  40 mg Oral q1800  . furosemide  80 mg Intravenous BID  . heparin  5,000 Units Subcutaneous 3 times per day  . isosorbide mononitrate  60 mg Oral Daily  . mirtazapine  15 mg Oral QHS  . palbociclib  125 mg Oral Daily  . potassium chloride SA  40 mEq Oral Daily  . potassium chloride  40 mEq Oral QHS  . sodium chloride  3 mL Intravenous Q12H  . tiotropium  18 mcg Inhalation Daily    OBJECTIVE  Filed Vitals:   04/02/14 1944 04/02/14 2209 04/03/14 0233 04/03/14 0514  BP: 98/46 109/51 121/48 108/46  Pulse:  82 85 81  Temp: 97.2 F (36.2 C)  98.1 F (36.7 C) 98 F (36.7 C)  TempSrc: Oral  Oral Oral  Resp: 18  20 18   Height:      Weight:    237 lb 10.5 oz (107.8 kg)  SpO2:   100% 100%    Intake/Output Summary (Last 24 hours) at 04/03/14 1137 Last data filed at 04/03/14 1000  Gross per 24 hour  Intake   1080 ml  Output   3400 ml  Net  -2320 ml   Filed Weights   04/01/14 2148 04/02/14 0519 04/03/14 0514  Weight: 242 lb 12.8 oz (110.133 kg) 238 lb 5.1 oz (108.1 kg) 237 lb 10.5 oz (107.8 kg)    PHYSICAL EXAM  General: Pleasant, NAD.  Obese  Neuro: Alert and oriented X 3. Moves all extremities spontaneously. Psych: Normal affect. HEENT:  Normal  Neck: Supple without bruits or JVD. Lungs:  Resp regular and unlabored, mild expiratory wheezing Heart: RRR no s3, s4, or murmurs. Abdomen: Soft, non-tender, non-distended, BS + x 4.  Extremities: No clubbing, cyanosis.  There is 2+ chronic bilateral pitting edema. DP/PT/Radials 2+ and  equal bilaterally.  Accessory Clinical Findings  CBC  Recent Labs  04/01/14 2214  WBC 2.7*  HGB 8.7*  HCT 27.1*  MCV 88.9  PLT 323   Basic Metabolic Panel  Recent Labs  04/02/14 0510 04/03/14 0350  NA 142 140  K 3.8 3.6*  CL 102 98  CO2 27 27  GLUCOSE 174* 165*  BUN 25* 26*  CREATININE 1.43* 1.51*  CALCIUM 9.0 9.1   Liver Function Tests  Recent Labs  04/01/14 2214  AST 43*  ALT 42*  ALKPHOS 113  BILITOT 0.3  PROT 6.5  ALBUMIN 3.3*   No results found for this basename: LIPASE, AMYLASE,  in the last 72 hours Cardiac Enzymes No results found for this basename: CKTOTAL, CKMB, CKMBINDEX, TROPONINI,  in the last 72 hours BNP No components found with this basename: POCBNP,  D-Dimer No results found for this basename: DDIMER,  in the last 72 hours Hemoglobin A1C No results found for this basename: HGBA1C,  in the last 72 hours Fasting Lipid Panel No results found for this basename: CHOL, HDL, LDLCALC, TRIG, CHOLHDL, LDLDIRECT,  in the last 72 hours Thyroid Function Tests No results found for  this basename: TSH, T4TOTAL, FREET3, T3FREE, THYROIDAB,  in the last 72 hours   Radiology/Studies  Chest x-ray pending  ASSESSMENT AND PLAN  1. acute on chronic diastolic heart failure. - Ejection fraction 55% by echocardiogram June 2015. ProBNP not markedly elevated. Diuresing significantly. Will reduce lasix to 40 IV BID due to rising creatinine and marked UOP.  2. chronic anemia - stable 3. diabetes mellitus -  Recent A1c is 6.5 4. chronic renal insufficiency - creatinine is rising. Baseline may be around 1.5. 5. 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 Hypokalemia - replete potassium.  Plan:  Decrease rate of diuresis - change to lasix 40 mg IV BID.  Increase potassium to 60 MEQ BID.  Can likely switch to po torsemide tomorrow at a higher than home dose.  Pixie Casino, MD, [facility] Attending Cardiologist [facility]  HILTY,Kenneth C

## 2014-04-04 DIAGNOSIS — I5042 Chronic combined systolic (congestive) and diastolic (congestive) heart failure: Secondary | ICD-10-CM

## 2014-04-04 DIAGNOSIS — I509 Heart failure, unspecified: Secondary | ICD-10-CM

## 2014-04-04 LAB — BASIC METABOLIC PANEL
Anion gap: 12 (ref 5–15)
BUN: 31 mg/dL — ABNORMAL HIGH (ref 6–23)
CALCIUM: 8.9 mg/dL (ref 8.4–10.5)
CO2: 28 mEq/L (ref 19–32)
CREATININE: 1.77 mg/dL — AB (ref 0.50–1.10)
Chloride: 99 mEq/L (ref 96–112)
GFR calc Af Amer: 33 mL/min — ABNORMAL LOW (ref >90)
GFR calc non Af Amer: 29 mL/min — ABNORMAL LOW (ref >90)
Glucose, Bld: 147 mg/dL — ABNORMAL HIGH (ref 70–99)
Potassium: 4 mEq/L (ref 3.7–5.3)
Sodium: 139 mEq/L (ref 137–147)

## 2014-04-04 LAB — PRO B NATRIURETIC PEPTIDE: Pro B Natriuretic peptide (BNP): 328.1 pg/mL — ABNORMAL HIGH (ref 0–125)

## 2014-04-04 MED ORDER — TORSEMIDE 20 MG PO TABS: 40.0000 mg | ORAL_TABLET | Freq: Two times a day (BID) | ORAL | Status: DC

## 2014-04-04 MED ORDER — TORSEMIDE 20 MG PO TABS
40.0000 mg | ORAL_TABLET | Freq: Two times a day (BID) | ORAL | Status: DC
  Administered 2014-04-04 – 2014-04-05 (×4): 40 mg via ORAL
  Filled 2014-04-04 (×5): qty 2

## 2014-04-04 NOTE — Progress Notes (Signed)
Patient Name: Anita [name] Date of Encounter: 04/04/2014     Active Problems:   Acute diastolic HF (heart failure)    SUBJECTIVE Diuresed another -1L negative last night. ProBNP is down to 328. Creatinine has risen to 1.77.  CURRENT MEDS . aspirin EC  81 mg Oral Daily  . atorvastatin  40 mg Oral q1800  . furosemide  40 mg Intravenous BID  . heparin  5,000 Units Subcutaneous 3 times per day  . isosorbide mononitrate  60 mg Oral Daily  . letrozole  2.5 mg Oral Daily  . mirtazapine  15 mg Oral QHS  . palbociclib  125 mg Oral Daily  . potassium chloride SA  60 mEq Oral Daily  . potassium chloride  60 mEq Oral QHS  . sodium chloride  3 mL Intravenous Q12H  . tiotropium  18 mcg Inhalation Daily    OBJECTIVE  Filed Vitals:   04/03/14 1500 04/03/14 2019 04/04/14 0440 04/04/14 0903  BP: 122/54 114/50 112/35   Pulse: 105 76 81   Temp: 97.3 F (36.3 C) 97.8 F (36.6 C) 98.2 F (36.8 C)   TempSrc: Oral Oral Oral   Resp: 18 20 20    Height:      Weight:   238 lb 12.1 oz (108.3 kg)   SpO2: 99% 100% 100% 100%    Intake/Output Summary (Last 24 hours) at 04/04/14 0909 Last data filed at 04/04/14 0249  Gross per 24 hour  Intake    480 ml  Output   1675 ml  Net  -1195 ml   Filed Weights   04/02/14 0519 04/03/14 0514 04/04/14 0440  Weight: 238 lb 5.1 oz (108.1 kg) 237 lb 10.5 oz (107.8 kg) 238 lb 12.1 oz (108.3 kg)    PHYSICAL EXAM  General: Pleasant, NAD.  Obese  Neuro: Alert and oriented X 3. Moves all extremities spontaneously. Psych: Normal affect. HEENT:  Normal  Neck: Supple without bruits or JVD. Lungs:  Resp regular and unlabored, mild expiratory wheezing Heart: RRR no s3, s4, or murmurs. Abdomen: Soft, non-tender, non-distended, BS + x 4.  Extremities: No clubbing, cyanosis.  There is 2+ chronic bilateral pitting edema. DP/PT/Radials 2+ and equal bilaterally.  Accessory Clinical Findings  CBC  Recent Labs  04/01/14 2214  WBC 2.7*  HGB 8.7*  HCT  27.1*  MCV 88.9  PLT 409   Basic Metabolic Panel  Recent Labs  04/03/14 0350 04/04/14 0411  NA 140 139  K 3.6* 4.0  CL 98 99  CO2 27 28  GLUCOSE 165* 147*  BUN 26* 31*  CREATININE 1.51* 1.77*  CALCIUM 9.1 8.9   Liver Function Tests  Recent Labs  04/01/14 2214  AST 43*  ALT 42*  ALKPHOS 113  BILITOT 0.3  PROT 6.5  ALBUMIN 3.3*   No results found for this basename: LIPASE, AMYLASE,  in the last 72 hours Cardiac Enzymes No results found for this basename: CKTOTAL, CKMB, CKMBINDEX, TROPONINI,  in the last 72 hours BNP No components found with this basename: POCBNP,  D-Dimer No results found for this basename: DDIMER,  in the last 72 hours Hemoglobin A1C No results found for this basename: HGBA1C,  in the last 72 hours Fasting Lipid Panel No results found for this basename: CHOL, HDL, LDLCALC, TRIG, CHOLHDL, LDLDIRECT,  in the last 72 hours Thyroid Function Tests No results found for this basename: TSH, T4TOTAL, FREET3, T3FREE, THYROIDAB,  in the last 72 hours   Radiology/Studies  Chest x-ray pending  ASSESSMENT AND PLAN  1. acute on chronic diastolic heart failure. - Ejection fraction 55% by echocardiogram June 2015. ProBNP not markedly elevated. Diuresing significantly. Change to po torsemide today.  2. chronic anemia - stable 3. diabetes mellitus -  Recent A1c is 6.5 4. chronic renal insufficiency - creatinine is rising. Baseline may be around 1.5, although higher at 1.77 today. Observe for stability on po diuretics. 5. 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 Hypokalemia - stable today  Plan:  Change to torsemide 40 mg BID.  Continue potassium 60 MEQ BID.  Anticipate d/c tomorrow if creatinine stable overnight and continues to be net negative.  Pixie Casino, MD, [facility] Attending Cardiologist [facility]  Jeree Delcid C

## 2014-04-05 ENCOUNTER — Ambulatory Visit (HOSPITAL_COMMUNITY): Payer: Medicare HMO

## 2014-04-05 ENCOUNTER — Other Ambulatory Visit: Payer: Commercial Managed Care - HMO

## 2014-04-05 ENCOUNTER — Other Ambulatory Visit (HOSPITAL_COMMUNITY): Payer: Commercial Managed Care - HMO

## 2014-04-05 ENCOUNTER — Telehealth: Payer: Self-pay | Admitting: Cardiology

## 2014-04-05 LAB — BASIC METABOLIC PANEL
Anion gap: 12 (ref 5–15)
BUN: 34 mg/dL — ABNORMAL HIGH (ref 6–23)
CO2: 28 meq/L (ref 19–32)
CREATININE: 1.83 mg/dL — AB (ref 0.50–1.10)
Calcium: 9 mg/dL (ref 8.4–10.5)
Chloride: 101 mEq/L (ref 96–112)
GFR calc Af Amer: 32 mL/min — ABNORMAL LOW (ref >90)
GFR, EST NON AFRICAN AMERICAN: 28 mL/min — AB (ref >90)
GLUCOSE: 165 mg/dL — AB (ref 70–99)
Potassium: 4 mEq/L (ref 3.7–5.3)
SODIUM: 141 meq/L (ref 137–147)

## 2014-04-05 MED ORDER — ALBUTEROL SULFATE (2.5 MG/3ML) 0.083% IN NEBU: 3.0000 mL | INHALATION_SOLUTION | RESPIRATORY_TRACT | Status: DC | PRN

## 2014-04-05 MED ORDER — ZOLPIDEM TARTRATE 5 MG PO TABS: 5.0000 mg | ORAL_TABLET | Freq: Every evening | ORAL | Status: DC | PRN

## 2014-04-05 MED ORDER — ACETAMINOPHEN 325 MG PO TABS: 650.0000 mg | ORAL_TABLET | ORAL | Status: DC | PRN

## 2014-04-05 MED ORDER — ALBUTEROL SULFATE (2.5 MG/3ML) 0.083% IN NEBU
3.0000 mL | INHALATION_SOLUTION | Freq: Four times a day (QID) | RESPIRATORY_TRACT | Status: DC
  Administered 2014-04-05 (×2): 3 mL via RESPIRATORY_TRACT
  Filled 2014-04-05: qty 3

## 2014-04-05 MED ORDER — ONDANSETRON HCL 4 MG/2ML IJ SOLN: 4.0000 mg | Freq: Four times a day (QID) | INTRAMUSCULAR | Status: DC | PRN

## 2014-04-05 MED ORDER — ALUM & MAG HYDROXIDE-SIMETH 200-200-20 MG/5ML PO SUSP: 30.0000 mL | Freq: Four times a day (QID) | ORAL | Status: DC | PRN

## 2014-04-05 MED ORDER — ALPRAZOLAM 0.25 MG PO TABS: 0.2500 mg | ORAL_TABLET | Freq: Two times a day (BID) | ORAL | Status: DC | PRN

## 2014-04-05 NOTE — Progress Notes (Signed)
Physical Therapy Treatment Patient Details Name: Anita [name] MRN: [id number] DOB: 10-Sep-1945 Today's Date: 04/05/2014    History of Present Illness Patient is a 68 yo female admitted 04/01/14 with DOE, weight gain, and LE edema.  Patient with CHF exacerbation.  Patient with h/o CAD s/p NSTEMI in 2014, COPD, obesity, HTN, DM and breast cancer with bone metastasis, depression, mild dementia.    PT Comments    Patient progressing well with ambulation and activity tolerance this session.  Agree with return home with HHPT to resume at d/c.  Follow Up Recommendations  Home health PT;Supervision/Assistance - 24 hour     Equipment Recommendations  None recommended by PT    Recommendations for Other Services       Precautions / Restrictions Precautions Precautions: Fall Precaution Comments: O2 dep    Mobility  Bed Mobility               General bed mobility comments: Patient in chair as PT entered room  Transfers Overall transfer level: Modified independent Equipment used: None Transfers: Sit to/from Stand Sit to Stand: Modified independent (Device/Increase time)            Ambulation/Gait Ambulation/Gait assistance: Min guard Ambulation Distance (Feet): 180 Feet Assistive device: Quad cane Gait Pattern/deviations: Step-through pattern;Shuffle;Decreased stride length;Wide base of support     General Gait Details: cues to use cane as carries it in left hand; dyspnea 3/4, but VSS   Stairs            Wheelchair Mobility    Modified Rankin (Stroke Patients Only)       Balance Overall balance assessment: Needs assistance         Standing balance support: During functional activity Standing balance-Leahy Scale: Fair                      Cognition Arousal/Alertness: Awake/alert Behavior During Therapy: WFL for tasks assessed/performed Overall Cognitive Status: History of cognitive impairments - at baseline                       Exercises General Exercises - Upper Extremity Shoulder Flexion: AROM;5 reps;Both;Seated;Other (comment) (with breathing coordinated) General Exercises - Lower Extremity Long Arc Quad: AROM;Both;10 reps;Seated Hip ABduction/ADduction: AROM;Both;10 reps;Seated Hip Flexion/Marching: AROM;Both;10 reps;Seated Toe Raises: AROM;10 reps;Both;Seated Heel Raises: AROM;10 reps;Both;Seated Other Exercises Other Exercises: scapular squeezes alternating with chest press x 10 seated Other Exercises: sit<>stand no UE assist x 5    General Comments        Pertinent Vitals/Pain  no pain complaints (states had chest pain earlier); SpO2 98%, HR 113 and RR 28 with ambulation    Home Living                      Prior Function            PT Goals (current goals can now be found in the care plan section) Progress towards PT goals: Progressing toward goals    Frequency  Min 3X/week    PT Plan Current plan remains appropriate    Co-evaluation             End of Session Equipment Utilized During Treatment: Gait belt;Oxygen Activity Tolerance: Patient tolerated treatment well Patient left: in chair;with call bell/phone within reach     Time: 1002-1020 PT Time Calculation (min): 18 min  Charges:  $Gait Training: 8-22 mins  G Codes:      WYNN,CYNDI 04/05/2014, 10:26 AM

## 2014-04-05 NOTE — Telephone Encounter (Signed)
Spoke with pt, according to dr nahser's progress note from today says the pt is not ready for discharge today. Patient voiced understanding

## 2014-04-05 NOTE — Clinical Documentation Improvement (Addendum)
"  Chronic Renal Insufficiency" documented in current medica record.  GFR (white female) has ranged in the 30's to 50's per [facility] review.  Known history of DM2.  Known history of HTN.  Possible Clinical Conditions:  CKD Stage I - GFR > OR = 90 CKD Stage II - GFR 60-89 CKD Stage III - GFR 30-59 CKD Stage IV - GFR 15-29 CKD Stage V - GFR < 15 ESRD (End Stage Renal Disease) Other Condition Unable to Clinically determine     Thank You,  Erling Conte ,RN BSN CCDS Certified Clinical Documentation Specialist:  [phone] [place] [employer] [facility]

## 2014-04-05 NOTE — Care Management Note (Addendum)
    Page 1 of 2   04/09/2014     3:48:03 PM CARE MANAGEMENT NOTE 04/09/2014  Patient:  Anita [name], Anita [name]   Account Number:  [id number]  Date Initiated:  04/05/2014  Documentation initiated by:  Anita [name]  Subjective/Objective Assessment:   68 year old female, followed by Dr. Percival Spanish with multiple medical problems, which include a h/o CAD s/p NSTEMI, COPD, obesity, HTN, DM and breast cancer with bone metastasis.// Home with spouse.     Action/Plan:   IV diuretic therapy; Will admit to telemetry; Daily weights, low-sodium diet and daily BMPs to monitor renal function and electrolytes. //Resume [name] services   Anticipated DC Date:  04/05/2014   Anticipated DC Plan:  [place]  CM consult      [name] Choice  HOME HEALTH   Choice offered to / List presented to:  C-1 Patient        [place] arranged  HH-1 RN  [place] OT      [facility] agency  [place]   Status of service:  Completed, signed off Medicare Important Message given?  YES (If response is "NO", the following Medicare IM given date fields will be blank) Date Medicare IM given:  04/07/2014 Medicare IM given by:  [facility] Date Additional Medicare IM given:  04/09/2014 Additional Medicare IM given by:  Ailsa Mireles  Discharge Disposition:  [place]  Per UR Regulation:  Reviewed for med. necessity/level of care/duration of stay  If discussed at [place] of Stay Meetings, dates discussed:   04/06/2014    Comments:  04/09/14 [place], RN, BSN, [place] [phone] She still has some edema but this may be baseline for her.We have diuresed 16 liters off since admission .  Will change to Demadex 40 bid and see if she continues to diurese.   Possible DC tomorrow 8/15.  04/05/14 1000 Kamila Broda J. Clydene Laming, RN, BSN, [place] [phone] Pt currently active with [name] for RN/OT services; please add PT.  Resumption  of care requested. [place] [facility] [place] notified.  No DME needs identified at this time.  Copy of Medicare Important Message given to pt.

## 2014-04-05 NOTE — Telephone Encounter (Signed)
New Prob   Requesting to speak to nurse regarding the progress of her current hospital stay. Please call.

## 2014-04-05 NOTE — Progress Notes (Addendum)
Patient Name: Anita [name] Date of Encounter: 04/05/2014     Active Problems:   Acute diastolic HF (heart failure)    SUBJECTIVE Diuresed another -1.3 L negative last night.  Creatinine continues to rise. She is very lethargic today.  Able to answer questions but not sit up or participate in exam .  CURRENT MEDS . aspirin EC  81 mg Oral Daily  . atorvastatin  40 mg Oral q1800  . heparin  5,000 Units Subcutaneous 3 times per day  . isosorbide mononitrate  60 mg Oral Daily  . letrozole  2.5 mg Oral Daily  . mirtazapine  15 mg Oral QHS  . palbociclib  125 mg Oral Daily  . potassium chloride SA  60 mEq Oral Daily  . potassium chloride  60 mEq Oral QHS  . sodium chloride  3 mL Intravenous Q12H  . tiotropium  18 mcg Inhalation Daily  . torsemide  40 mg Oral BID    OBJECTIVE  Filed Vitals:   04/04/14 1335 04/04/14 1344 04/04/14 2136 04/05/14 0608  BP: 81/47 103/77 112/49 110/48  Pulse: 87  96 86  Temp: 97.8 F (36.6 C)  98.5 F (36.9 C) 98.3 F (36.8 C)  TempSrc: Oral  Oral Oral  Resp: 20  18 18   Height:      Weight:    238 lb 12.8 oz (108.319 kg)  SpO2: 98%  100% 100%    Intake/Output Summary (Last 24 hours) at 04/05/14 0736 Last data filed at 04/05/14 0100  Gross per 24 hour  Intake   1360 ml  Output   2700 ml  Net  -1340 ml   Filed Weights   04/03/14 0514 04/04/14 0440 04/05/14 0608  Weight: 237 lb 10.5 oz (107.8 kg) 238 lb 12.1 oz (108.3 kg) 238 lb 12.8 oz (108.319 kg)    PHYSICAL EXAM  General: Pleasant, NAD.  Obese  Neuro:  Psych: lethargic  HEENT:  Normal  Neck: Supple without bruits or JVD. Lungs:  Resp regular and unlabored, mild expiratory wheezing Heart: RRR no s3, s4, or murmurs. Abdomen: Soft, non-tender, non-distended, BS + x 4.  Extremities: No clubbing, cyanosis.  There is 2+ chronic bilateral pitting edema. DP/PT/Radials 2+ and equal bilaterally.  Accessory Clinical Findings  CBC No results found for this basename: WBC,  NEUTROABS, HGB, HCT, MCV, PLT,  in the last 72 hours Basic Metabolic Panel  Recent Labs  04/04/14 0411 04/05/14 0419  NA 139 141  K 4.0 4.0  CL 99 101  CO2 28 28  GLUCOSE 147* 165*  BUN 31* 34*  CREATININE 1.77* 1.83*  CALCIUM 8.9 9.0   Liver Function Tests No results found for this basename: AST, ALT, ALKPHOS, BILITOT, PROT, ALBUMIN,  in the last 72 hours No results found for this basename: LIPASE, AMYLASE,  in the last 72 hours Cardiac Enzymes No results found for this basename: CKTOTAL, CKMB, CKMBINDEX, TROPONINI,  in the last 72 hours BNP No components found with this basename: POCBNP,  D-Dimer No results found for this basename: DDIMER,  in the last 72 hours Hemoglobin A1C No results found for this basename: HGBA1C,  in the last 72 hours Fasting Lipid Panel No results found for this basename: CHOL, HDL, LDLCALC, TRIG, CHOLHDL, LDLDIRECT,  in the last 72 hours Thyroid Function Tests No results found for this basename: TSH, T4TOTAL, FREET3, T3FREE, THYROIDAB,  in the last 72 hours   Radiology/Studies    ASSESSMENT AND PLAN  1. acute on chronic  diastolic heart failure. - Ejection fraction 55% by echocardiogram June 2015. ProBNP not markedly elevated. Diuresing significantly. She has continued to diurese on PO Torsemide.   Her creatinine has continued to go up slightly.   This may be her baseline.    She is not ready for DC today.  2. chronic anemia - stable 3. diabetes mellitus -  Recent A1c is 6.5 4. chronic renal insufficiency - creatinine is rising. Her baseline may be higher than we initially thought.  5. 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 Hypokalemia - stable today 7. Wheezing -  Will change albuterol to scheduled Q 6 hours for a day or so   Thayer Headings, Brooke Bonito., MD, [facility] 04/05/2014, 7:39 AM [address]  [place] Pager [phone]

## 2014-04-06 DIAGNOSIS — N183 Chronic kidney disease, stage 3 unspecified: Secondary | ICD-10-CM

## 2014-04-06 MED ORDER — FUROSEMIDE 10 MG/ML IJ SOLN
40.0000 mg | Freq: Two times a day (BID) | INTRAMUSCULAR | Status: DC
  Administered 2014-04-06 – 2014-04-08 (×5): 40 mg via INTRAVENOUS
  Filled 2014-04-06 (×6): qty 4

## 2014-04-06 NOTE — Progress Notes (Signed)
UR complete.  Tyrese Capriotti RN, MSN 

## 2014-04-06 NOTE — Progress Notes (Signed)
Patient is currently active with [facility] for chronic disease management services.  Patient has been engaged by a [employer] and LCSW.  Our community based plan of care has focused on disease management of CHF, medication procurement/adherence, and community resource support  Patient will receive a post discharge transition of care call and will be evaluated for monthly home visits for assessments and disease process education.  Made Inpatient Case Manager aware that [place] [facility] following. Of note, [facility] Care Management services does not replace or interfere with any services that are arranged by inpatient case management or social work.  For additional questions or referrals please contact Corliss Blacker BSN RN [facility] Liaison at [phone].

## 2014-04-06 NOTE — Progress Notes (Signed)
Patient Name: Anita [name] Date of Encounter: 04/06/2014     Active Problems:   Acute diastolic HF (heart failure)    SUBJECTIVE Diuresed another -1.3 L negative last night.   She is very lethargic today.  Able to answer questions but not sit up or participate in exam .  CURRENT MEDS . aspirin EC  81 mg Oral Daily  . atorvastatin  40 mg Oral q1800  . heparin  5,000 Units Subcutaneous 3 times per day  . isosorbide mononitrate  60 mg Oral Daily  . letrozole  2.5 mg Oral Daily  . mirtazapine  15 mg Oral QHS  . palbociclib  125 mg Oral Daily  . potassium chloride SA  60 mEq Oral Daily  . potassium chloride  60 mEq Oral QHS  . sodium chloride  3 mL Intravenous Q12H  . tiotropium  18 mcg Inhalation Daily  . torsemide  40 mg Oral BID    OBJECTIVE  Filed Vitals:   04/05/14 2001 04/05/14 2055 04/06/14 0515 04/06/14 0743  BP:  111/45 112/42   Pulse:  83 69   Temp:  97.5 F (36.4 C) 98.3 F (36.8 C)   TempSrc:  Oral Oral   Resp:  20 19   Height:      Weight:   238 lb 5.1 oz (108.1 kg)   SpO2: 99% 100% 95% 100%    Intake/Output Summary (Last 24 hours) at 04/06/14 0848 Last data filed at 04/06/14 0817  Gross per 24 hour  Intake   1160 ml  Output   2750 ml  Net  -1590 ml   Filed Weights   04/04/14 0440 04/05/14 0608 04/06/14 0515  Weight: 238 lb 12.1 oz (108.3 kg) 238 lb 12.8 oz (108.319 kg) 238 lb 5.1 oz (108.1 kg)    PHYSICAL EXAM  General: Pleasant, NAD.  Obese  Neuro:  Psych: lethargic  HEENT:  Normal  Neck: Supple without bruits or JVD. Lungs:  Resp regular and unlabored, mild expiratory wheezing Heart: RRR no s3, s4, or murmurs. Abdomen: Soft, non-tender, non-distended, BS + x 4.  Extremities: No clubbing, cyanosis.  There is 2+ chronic bilateral pitting edema. DP/PT/Radials 2+ and equal bilaterally.  Accessory Clinical Findings  CBC No results found for this basename: WBC, NEUTROABS, HGB, HCT, MCV, PLT,  in the last 72 hours Basic Metabolic  Panel  Recent Labs  04/04/14 0411 04/05/14 0419  NA 139 141  K 4.0 4.0  CL 99 101  CO2 28 28  GLUCOSE 147* 165*  BUN 31* 34*  CREATININE 1.77* 1.83*  CALCIUM 8.9 9.0   Liver Function Tests No results found for this basename: AST, ALT, ALKPHOS, BILITOT, PROT, ALBUMIN,  in the last 72 hours No results found for this basename: LIPASE, AMYLASE,  in the last 72 hours Cardiac Enzymes No results found for this basename: CKTOTAL, CKMB, CKMBINDEX, TROPONINI,  in the last 72 hours BNP No components found with this basename: POCBNP,  D-Dimer No results found for this basename: DDIMER,  in the last 72 hours Hemoglobin A1C No results found for this basename: HGBA1C,  in the last 72 hours Fasting Lipid Panel No results found for this basename: CHOL, HDL, LDLCALC, TRIG, CHOLHDL, LDLDIRECT,  in the last 72 hours Thyroid Function Tests No results found for this basename: TSH, T4TOTAL, FREET3, T3FREE, THYROIDAB,  in the last 72 hours   ASSESSMENT AND PLAN  1. acute on chronic diastolic heart failure. - Ejection fraction 55% by echocardiogram June  2015. ProBNP not markedly elevated.  Her urine output has decreased since putting her on PO Torsemide.  She did not lose any weight overnight.   Will need to restart IV lasix.    Her creatinine has continued to go up slightly.   This may be her baseline.   2. chronic anemia - stable 3. diabetes mellitus -  Recent A1c is 6.5 4. chronic renal insufficiency - Stage III - creatinine is rising. Her baseline may be higher than we initially thought.  5. 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 Hypokalemia - stable today 7. Wheezing -  Better today.    Thayer Headings, Brooke Bonito., MD, [facility] 04/06/2014, 8:48 AM [address]  [place] Pager [phone]

## 2014-04-06 NOTE — Progress Notes (Signed)
Orthopedic Tech Progress Note Patient Details:  Anita [name] December 08, 1945 [id number] Coapt. Splint applied Ortho Devices Type of Ortho Device: Coapt;Ace wrap Ortho Device/Splint Location: RUE Ortho Device/Splint Interventions: Application   Asia R Thompson 04/06/2014, 2:38 PM

## 2014-04-07 ENCOUNTER — Ambulatory Visit: Payer: Commercial Managed Care - HMO

## 2014-04-07 LAB — BASIC METABOLIC PANEL
Anion gap: 13 (ref 5–15)
BUN: 33 mg/dL — AB (ref 6–23)
CALCIUM: 9 mg/dL (ref 8.4–10.5)
CO2: 27 meq/L (ref 19–32)
CREATININE: 1.61 mg/dL — AB (ref 0.50–1.10)
Chloride: 98 mEq/L (ref 96–112)
GFR calc Af Amer: 37 mL/min — ABNORMAL LOW (ref >90)
GFR, EST NON AFRICAN AMERICAN: 32 mL/min — AB (ref >90)
GLUCOSE: 174 mg/dL — AB (ref 70–99)
Potassium: 4.5 mEq/L (ref 3.7–5.3)
Sodium: 138 mEq/L (ref 137–147)

## 2014-04-07 NOTE — Progress Notes (Signed)
Physical Therapy Treatment Patient Details Name: Anita [name] MRN: [id number] DOB: 08-Apr-1946 Today's Date: 04/07/2014    History of Present Illness Patient is a 68 yo female admitted 04/01/14 with DOE, weight gain, and LE edema.  Patient with CHF exacerbation.  Patient with h/o CAD s/p NSTEMI in 2014, COPD, obesity, HTN, DM and breast cancer with bone metastasis, depression, mild dementia.    PT Comments    Pt continues to make good progress with overall mobility, however demonstrates decreased safety awareness, esp when it comes to energy conservation.  Multiple cuing throughout for decreased gait speed and pursed lip breathing.    Follow Up Recommendations  Home health PT;Supervision/Assistance - 24 hour     Equipment Recommendations  None recommended by PT    Recommendations for Other Services       Precautions / Restrictions Precautions Precautions: Fall Precaution Comments: O2 dep Restrictions Weight Bearing Restrictions: No    Mobility  Bed Mobility Overal bed mobility:  (Pt in recliner when PT arrived. )                Transfers Overall transfer level: Modified independent Equipment used: None Transfers: Sit to/from Stand Sit to Stand: Modified independent (Device/Increase time)         General transfer comment: Pt with good use of hands to sit/stand  Ambulation/Gait Ambulation/Gait assistance: Supervision Ambulation Distance (Feet): 75 Feet Assistive device: None Gait Pattern/deviations: Step-to pattern;Decreased stride length;Shuffle;Trunk flexed;Narrow base of support     General Gait Details: Pt did not wish to use quad cane during this session.  Demonstrates increased gait speed, DOE, and pt with tendency to pull O2 out of nose or mouth breath.  Max verbal cues for slower gait speed for safety, increased stride length and increased pursed lip breathing.  SaO2 remained in upper 90's throughout session on 3L O2.  No overt LOB noted during session.     Stairs            Wheelchair Mobility    Modified Rankin (Stroke Patients Only)       Balance Overall balance assessment: Needs assistance Sitting-balance support: Feet supported Sitting balance-Leahy Scale: Normal     Standing balance support: During functional activity Standing balance-Leahy Scale: Good                      Cognition Arousal/Alertness: Awake/alert Behavior During Therapy: WFL for tasks assessed/performed Overall Cognitive Status: History of cognitive impairments - at baseline                      Exercises General Exercises - Upper Extremity Shoulder Flexion: Strengthening;Both;10 reps (not full ROM) General Exercises - Lower Extremity Long Arc Quad: Both;10 reps;Seated;Strengthening Hip ABduction/ADduction: Both;10 reps;Standing;Strengthening Hip Flexion/Marching: AROM;Both;10 reps;Seated Toe Raises: AROM;10 reps;Both;Standing Heel Raises: AROM;10 reps;Both;Standing;Strengthening Other Exercises Other Exercises: mini squats x 10 reps     General Comments        Pertinent Vitals/Pain      Home Living                      Prior Function            PT Goals (current goals can now be found in the care plan section) Acute Rehab PT Goals Patient Stated Goal: To get stronger PT Goal Formulation: With patient Time For Goal Achievement: 04/09/14 Potential to Achieve Goals: Good Progress towards PT goals: Progressing toward goals  Frequency  Min 3X/week    PT Plan Current plan remains appropriate    Co-evaluation             End of Session Equipment Utilized During Treatment: Oxygen Activity Tolerance: Patient tolerated treatment well Patient left: in chair;with call bell/phone within reach     Time: 1003-1019 PT Time Calculation (min): 16 min  Charges:  $Therapeutic Exercise: 23-37 mins                    G Codes:      Denice Bors 04/07/2014, 10:27 AM

## 2014-04-07 NOTE — Progress Notes (Signed)
Patient stated that the MD was complaining about her weight compared to the amount of fluid that she has lost. reweighed patient. Weighed 240.8 pounds.

## 2014-04-07 NOTE — Progress Notes (Signed)
Patient Name: Anita [name] Date of Encounter: 04/07/2014     Active Problems:   Acute diastolic HF (heart failure)    SUBJECTIVE Diuresed another -2.9L negative last night.   Wt has not changed in several days--- ? Accuracy of scales.  She is very lethargic today.  Able to answer questions but not sit up or participate in exam  .  CURRENT MEDS . aspirin EC  81 mg Oral Daily  . atorvastatin  40 mg Oral q1800  . furosemide  40 mg Intravenous BID  . heparin  5,000 Units Subcutaneous 3 times per day  . isosorbide mononitrate  60 mg Oral Daily  . letrozole  2.5 mg Oral Daily  . mirtazapine  15 mg Oral QHS  . potassium chloride SA  60 mEq Oral Daily  . potassium chloride  60 mEq Oral QHS  . sodium chloride  3 mL Intravenous Q12H  . tiotropium  18 mcg Inhalation Daily    OBJECTIVE  Filed Vitals:   04/06/14 2039 04/07/14 0551 04/07/14 0837 04/07/14 0900  BP: 110/50 110/47 112/51   Pulse: 81 77 76   Temp: 98.5 F (36.9 C) 97.8 F (36.6 C) 97.9 F (36.6 C)   TempSrc: Oral Oral Oral   Resp: 18 17 18    Height:      Weight:  238 lb 8.6 oz (108.2 kg)    SpO2: 96% 100% 100% 100%    Intake/Output Summary (Last 24 hours) at 04/07/14 1110 Last data filed at 04/07/14 0554  Gross per 24 hour  Intake    740 ml  Output   3900 ml  Net  -3160 ml   Filed Weights   04/05/14 0608 04/06/14 0515 04/07/14 0551  Weight: 238 lb 12.8 oz (108.319 kg) 238 lb 5.1 oz (108.1 kg) 238 lb 8.6 oz (108.2 kg)    PHYSICAL EXAM  General: Pleasant, NAD.  Obese  Neuro:  Psych: lethargic  HEENT:  Normal  Neck: Supple without bruits or JVD. Lungs:  Resp regular and unlabored, mild expiratory wheezing Heart: RRR no s3, s4, or murmurs. Abdomen: Soft, non-tender, non-distended, BS + x 4.  Extremities: No clubbing, cyanosis.  There is 2+ chronic bilateral pitting edema. DP/PT/Radials 2+ and equal bilaterally.  Accessory Clinical Findings  CBC No results found for this basename: WBC,  NEUTROABS, HGB, HCT, MCV, PLT,  in the last 72 hours Basic Metabolic Panel  Recent Labs  04/05/14 0419 04/07/14 0600  NA 141 138  K 4.0 4.5  CL 101 98  CO2 28 27  GLUCOSE 165* 174*  BUN 34* 33*  CREATININE 1.83* 1.61*  CALCIUM 9.0 9.0   Liver Function Tests No results found for this basename: AST, ALT, ALKPHOS, BILITOT, PROT, ALBUMIN,  in the last 72 hours No results found for this basename: LIPASE, AMYLASE,  in the last 72 hours Cardiac Enzymes No results found for this basename: CKTOTAL, CKMB, CKMBINDEX, TROPONINI,  in the last 72 hours BNP No components found with this basename: POCBNP,  D-Dimer No results found for this basename: DDIMER,  in the last 72 hours Hemoglobin A1C No results found for this basename: HGBA1C,  in the last 72 hours Fasting Lipid Panel No results found for this basename: CHOL, HDL, LDLCALC, TRIG, CHOLHDL, LDLDIRECT,  in the last 72 hours Thyroid Function Tests No results found for this basename: TSH, T4TOTAL, FREET3, T3FREE, THYROIDAB,  in the last 72 hours   ASSESSMENT AND PLAN  1. acute on chronic diastolic heart  failure. - Ejection fraction 55% by echocardiogram June 2015. ProBNP not markedly elevated.  We have started her back on IV lasix and she is putting out significant amount of urine.  She did not lose any weight overnight.     Creatinine has improved  2. chronic anemia - stable 3. diabetes mellitus -  Recent A1c is 6.5 4. chronic renal insufficiency - Stage III - creatinine is rising. Her baseline may be higher than we initially thought.  5. 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 Hypokalemia - stable today 7. Wheezing -  Better today.    Thayer Headings, Brooke Bonito., MD, [facility] 04/07/2014, 11:10 AM [address]  [place] Pager [phone]

## 2014-04-08 ENCOUNTER — Ambulatory Visit: Payer: Commercial Managed Care - HMO | Admitting: Family Medicine

## 2014-04-08 LAB — BASIC METABOLIC PANEL
Anion gap: 15 (ref 5–15)
BUN: 31 mg/dL — AB (ref 6–23)
CALCIUM: 9 mg/dL (ref 8.4–10.5)
CO2: 24 meq/L (ref 19–32)
CREATININE: 1.57 mg/dL — AB (ref 0.50–1.10)
Chloride: 101 mEq/L (ref 96–112)
GFR calc Af Amer: 39 mL/min — ABNORMAL LOW (ref >90)
GFR, EST NON AFRICAN AMERICAN: 33 mL/min — AB (ref >90)
GLUCOSE: 151 mg/dL — AB (ref 70–99)
Potassium: 4.5 mEq/L (ref 3.7–5.3)
Sodium: 140 mEq/L (ref 137–147)

## 2014-04-08 MED ORDER — FUROSEMIDE 10 MG/ML IJ SOLN
40.0000 mg | Freq: Three times a day (TID) | INTRAMUSCULAR | Status: DC
  Administered 2014-04-08 (×2): 40 mg via INTRAVENOUS
  Filled 2014-04-08 (×4): qty 4

## 2014-04-08 MED ORDER — POTASSIUM CHLORIDE CRYS ER 20 MEQ PO TBCR
40.0000 meq | EXTENDED_RELEASE_TABLET | Freq: Two times a day (BID) | ORAL | Status: DC
  Administered 2014-04-08 – 2014-04-10 (×5): 40 meq via ORAL
  Filled 2014-04-08 (×4): qty 2

## 2014-04-08 NOTE — Progress Notes (Signed)
    Subjective:  Up in chair, lethargic but responds appropriatley  Objective:  Vital Signs in the last 24 hours: Temp:  [97.2 F (36.2 C)-98.5 F (36.9 C)] 98.5 F (36.9 C) (08/13 0553) Pulse Rate:  [80-86] 86 (08/13 0553) Resp:  [18-19] 19 (08/13 0553) BP: (111-133)/(62-66) 133/66 mmHg (08/13 0553) SpO2:  [95 %-100 %] 95 % (08/13 0553) Weight:  [240 lb 4.8 oz (109 kg)-240 lb 12.8 oz (109.226 kg)] 240 lb 4.8 oz (109 kg) (08/13 0553)  Intake/Output from previous day:  Intake/Output Summary (Last 24 hours) at 04/08/14 0855 Last data filed at 04/08/14 2633  Gross per 24 hour  Intake   1200 ml  Output   3800 ml  Net  -2600 ml    Physical Exam: General appearance: cooperative, morbidly obese and slowed mentation Lungs: decreased breath sounds, faint wheezes Heart: regular rate and rhythm Extremities: 2-3+ edema   Rate: 84  Rhythm: normal sinus rhythm  Lab Results: No results found for this basename: WBC, HGB, PLT,  in the last 72 hours  Recent Labs  04/07/14 0600 04/08/14 0510  NA 138 140  K 4.5 4.5  CL 98 101  CO2 27 24  GLUCOSE 174* 151*  BUN 33* 31*  CREATININE 1.61* 1.57*   No results found for this basename: TROPONINI, CK, MB,  in the last 72 hours No results found for this basename: INR,  in the last 72 hours  Imaging: Imaging results have been reviewed  Cardiac Studies:  Assessment/Plan:  68 year old female with a complex PMH including metastatic breast cancer, chronic combined CHF, COPD and chronic respiratory failure on home O2, DM 2, CAD status post NSTEMI x 3 who was admitted 03/24/14 with acute on chronic diastolic CHF. Ejection fraction 55% by echocardiogram June 2015. She has diuresed by I/O but no significant wgt change.    Principal Problem:   Acute diastolic HF (heart failure) Active Problems:   DM2 (diabetes mellitus, type 2)   Chronic combined systolic and diastolic congestive heart failure   CAD (coronary artery disease)   Chronic  kidney disease, stage III (moderate)   COPD (chronic obstructive pulmonary disease)   Chronic respiratory failure   Breast cancer metastasized to bone    PLAN:  It looks like the plan is for DC home when we feel she is ready. She is still diuresing on IV Lasix, SCr improving from peak of 1.83. Will back down on K+ a little (K+ 4.5)  Vergia Alberts [phone] 04/08/2014, 8:55 AM   Attending Note:   The patient was seen and examined.  Agree with assessment and plan as noted above.  Changes made to the above note as needed.  She continues to have lots of edema.   Weight has gone up despite her being diuresed 13 liters since admission.Rene Paci stressed the importance of not drinking anything unless the nurse brings it to her.  Will increase lasix to TID.  She needs bed rest with leg elevation . Foley.  Thayer Headings, Brooke Bonito., MD, [facility] 04/08/2014, 11:24 AM [address]  [place] Pager [phone]

## 2014-04-09 LAB — BASIC METABOLIC PANEL
Anion gap: 15 (ref 5–15)
BUN: 31 mg/dL — AB (ref 6–23)
CALCIUM: 8.9 mg/dL (ref 8.4–10.5)
CO2: 27 mEq/L (ref 19–32)
Chloride: 98 mEq/L (ref 96–112)
Creatinine, Ser: 1.65 mg/dL — ABNORMAL HIGH (ref 0.50–1.10)
GFR calc Af Amer: 36 mL/min — ABNORMAL LOW (ref >90)
GFR, EST NON AFRICAN AMERICAN: 31 mL/min — AB (ref >90)
GLUCOSE: 161 mg/dL — AB (ref 70–99)
Potassium: 3.9 mEq/L (ref 3.7–5.3)
Sodium: 140 mEq/L (ref 137–147)

## 2014-04-09 MED ORDER — TORSEMIDE 20 MG PO TABS
40.0000 mg | ORAL_TABLET | Freq: Two times a day (BID) | ORAL | Status: DC
  Administered 2014-04-09 – 2014-04-10 (×4): 40 mg via ORAL
  Filled 2014-04-09 (×4): qty 2

## 2014-04-09 NOTE — Progress Notes (Signed)
    Subjective:  Up in chair reading the newspaper!  Objective:  Vital Signs in the last 24 hours: Temp:  [97.8 F (36.6 C)-98.4 F (36.9 C)] 98.2 F (36.8 C) (08/14 0510) Pulse Rate:  [80-93] 80 (08/14 0510) Resp:  [18] 18 (08/14 0510) BP: (97-130)/(56-59) 120/58 mmHg (08/14 0510) SpO2:  [97 %-99 %] 98 % (08/14 0510) Weight:  [235 lb 4.8 oz (106.731 kg)] 235 lb 4.8 oz (106.731 kg) (08/14 0356)  Intake/Output from previous day:  Intake/Output Summary (Last 24 hours) at 04/09/14 0806 Last data filed at 04/09/14 0510  Gross per 24 hour  Intake   1320 ml  Output   4077 ml  Net  -2757 ml    Physical Exam: General appearance: alert, cooperative, no distress and moderately obese Lungs: decreased breath sounds bases Heart: regular rate and rhythm Extremities: 2+ edema   Rate: 78  Rhythm: normal sinus rhythm  Lab Results: No results found for this basename: WBC, HGB, PLT,  in the last 72 hours  Recent Labs  04/08/14 0510 04/09/14 0434  NA 140 140  K 4.5 3.9  CL 101 98  CO2 24 27  GLUCOSE 151* 161*  BUN 31* 31*  CREATININE 1.57* 1.65*   No results found for this basename: TROPONINI, CK, MB,  in the last 72 hours No results found for this basename: INR,  in the last 72 hours  Imaging: Imaging results have been reviewed  Cardiac Studies:  Assessment/Plan:  68 year old female with a complex PMH including metastatic breast cancer, chronic combined CHF, COPD and chronic respiratory failure on home O2, DM 2, CAD status post NSTEMI x 3 who was admitted 03/24/14 with acute on chronic diastolic CHF. Ejection fraction 55% by echocardiogram June 2015. She has diuresed by I/O but no significant wgt change.    Principal Problem:   Acute diastolic HF (heart failure) Active Problems:   DM2 (diabetes mellitus, type 2)   Chronic combined systolic and diastolic congestive heart failure   CAD (coronary artery disease)   Chronic kidney disease, stage III (moderate)   COPD  (chronic obstructive pulmonary disease)   Chronic respiratory failure   Breast cancer metastasized to bone    PLAN: Will review with MD- close to discharge. Change to po diuretics.   Kerin Ransom PA-C [place] [phone] 04/09/2014, 8:06 AM   Attending Note:   The patient was seen and examined.  Agree with assessment and plan as noted above.  Changes made to the above note as needed.  She still has some edema but this may be baseline for her. We have diuresed 16 liters off since admission .  Will change to Demadex 40 bid and see if she continues to diurese.   Possible DC tomorrow.   Thayer Headings, Brooke Bonito., MD, [facility] 04/09/2014, 8:35 AM [address],  [place] Pager [phone]

## 2014-04-09 NOTE — Progress Notes (Signed)
Physical Therapy Treatment Patient Details Name: JAZZALYNN RHUDY MRN: [id number] DOB: [birth year] Today's Date: 04-11-14    History of Present Illness Patient is a 68 yo female admitted 04/01/14 with DOE, weight gain, and LE edema.  Patient with CHF exacerbation.  Patient with h/o CAD s/p NSTEMI in 2014, COPD, obesity, HTN, DM and breast cancer with bone metastasis, depression, mild dementia.    PT Comments    Patient declined mobility today, reporting "I can walk"  Performed exercises for strengthening today.  Encouraged patient to continue to do these exercises throughout the day.  Follow Up Recommendations  Home health PT;Supervision/Assistance - 24 hour     Equipment Recommendations  None recommended by PT    Recommendations for Other Services       Precautions / Restrictions Precautions Precautions: Fall Restrictions Weight Bearing Restrictions: No    Mobility  Bed Mobility                  Transfers                 General transfer comment: Patient declined ambulation today.  Ambulation/Gait                 Stairs            Wheelchair Mobility    Modified Rankin (Stroke Patients Only)       Balance                                    Cognition Arousal/Alertness: Awake/alert Behavior During Therapy: WFL for tasks assessed/performed Overall Cognitive Status: History of cognitive impairments - at baseline                      Exercises General Exercises - Upper Extremity Shoulder Flexion: AROM;Left;10 reps;Seated General Exercises - Lower Extremity Gluteal Sets: AROM;Both;10 reps;Seated Long Arc Quad: AROM;Both;10 reps;Seated Hip ABduction/ADduction: AROM;Both;10 reps;Seated Hip Flexion/Marching: AROM;Both;10 reps;Seated Toe Raises: AROM;Both;15 reps;Seated Heel Raises: AROM;Both;15 reps;Seated Other Exercises Other Exercises: sit > stand 5 reps without use of UE's    General Comments         Pertinent Vitals/Pain Pain Assessment: No/denies pain    Home Living                      Prior Function            PT Goals (current goals can now be found in the care plan section) Progress towards PT goals: Progressing toward goals    Frequency  Min 3X/week    PT Plan Current plan remains appropriate    Co-evaluation             End of Session Equipment Utilized During Treatment:  (Not wearing O2 as PT entered room) Activity Tolerance: Patient tolerated treatment well Patient left: in chair;with call bell/phone within reach     Time: 2836-6294 PT Time Calculation (min): 9 min  Charges:  $Therapeutic Exercise: 8-22 mins                    G Codes:      Despina Pole 2014-04-11, 2:52 PM Carita Pian. Sanjuana Kava, [place] Pager [phone]

## 2014-04-10 LAB — BASIC METABOLIC PANEL
Anion gap: 16 — ABNORMAL HIGH (ref 5–15)
BUN: 34 mg/dL — AB (ref 6–23)
CALCIUM: 9.4 mg/dL (ref 8.4–10.5)
CO2: 24 mEq/L (ref 19–32)
Chloride: 97 mEq/L (ref 96–112)
Creatinine, Ser: 1.68 mg/dL — ABNORMAL HIGH (ref 0.50–1.10)
GFR calc Af Amer: 36 mL/min — ABNORMAL LOW (ref >90)
GFR calc non Af Amer: 31 mL/min — ABNORMAL LOW (ref >90)
GLUCOSE: 160 mg/dL — AB (ref 70–99)
Potassium: 3.9 mEq/L (ref 3.7–5.3)
Sodium: 137 mEq/L (ref 137–147)

## 2014-04-10 MED ORDER — TORSEMIDE 20 MG PO TABS: 40.0000 mg | ORAL_TABLET | Freq: Two times a day (BID) | ORAL | Status: DC

## 2014-04-10 MED ORDER — ISOSORBIDE MONONITRATE ER 60 MG PO TB24: 60.0000 mg | ORAL_TABLET | Freq: Every day | ORAL | Status: DC

## 2014-04-10 MED ORDER — POTASSIUM CHLORIDE CRYS ER 20 MEQ PO TBCR: 40.0000 meq | EXTENDED_RELEASE_TABLET | Freq: Two times a day (BID) | ORAL | Status: DC

## 2014-04-10 NOTE — Discharge Summary (Signed)
Discharge Summary   Patient ID: SKYRA CRICHLOW MRN: [id number], DOB/AGE: August 27, 1946 68 y.o. Admit date: 04/01/2014 D/C date:     04/10/2014  Primary Cardiologist: Dr. Percival Spanish  Principal Problem:   Acute diastolic HF (heart failure) Active Problems:   DM2 (diabetes mellitus, type 2)   COPD (chronic obstructive pulmonary disease)   Chronic respiratory failure   Chronic combined systolic and diastolic congestive heart failure   CAD (coronary artery disease)   Chronic kidney disease, stage III (moderate)   Anemia of chronic disease   Breast mass, right   History of tobacco abuse   Breast cancer metastasized to bone   Hyperlipidemia   Lesion of vocal cord   Admission Dates: 04/01/14- 04/10/14 Discharge Diagnosis: Acute on chronic diastolic CHF- discharge weight 236 lbs   HPI: Anita [name] is a 68 y.o. female with a history of COPD, obesity, HTN, DM, chronic diastolic CHF (EF 97%), CAD s/p NSTEMI in 2014 , CKD and breast cancer with bone metastasis who was directly admitted from the office on 04/01/14 for SOB and volume overload.   She was recently admitted to [facility] at the end of June for acute on chronic diastolic heart failure and was treated with IV diuretics. A 2-D echocardiogram revealed normal left ventricular systolic function with an ejection fraction of 55%. She was discharged home with a dry weight of 228 pounds. She was sent home on PO Lasix twice a day. Since discharge, she had serial increases in her weight, despite reported medication compliance and adherence to a low-sodium diet. She was seen by Dr. Percival Spanish for followup on 04/01/2014. At that time, her weight had increased by 7 pounds beyond her dry weight. She also noted dyspnea on exertion. He increased her Lasix to 80 mg as well as added Zaroxolyn as an adjunct. Despite this, she had no improvement in symptoms and no increased diuresis. Her weight continued to slowly increase. She was evaluated by her primary care  provider, Dr. Lacinda Axon, on 03/24/2014. At that time her weight had further increased. Dr. Lacinda Axon switched her diuretic from Lasix to torsemide as it was felt that underlying gut edema was leading to poor absorption. She was placed on 40 mg daily. Zaroxolyn was also discontinued.  She reported back to clinic on 04/01/14 with complaints of continued weight gain and increasing dyspnea on exertion. Weight  was 240 pounds and she had significant peripheral edema. She reported full medication compliance and adherence to low-sodium diet. It was recommended that she be directly admitted for inpatient IV diuresis.   Hospital Course: She was admitted for IV diuresis with Lasix and ultimately converted to torsemide the day before discharge. There was a cardiorenal component however it was thought that her baseline creatinine may be higher than previously thought. She will be discharged with a stable creatinine at 1.68. She also had some issues with hypokalemia which was supplemented as needed. Discharge K 3.9.  Her weight decreased 6 pounds (242-->236lbs) since admission which is a big discrepancy compared to the reported 17 L net diuresis. Previous "dry weight" at hospital DC was 228 lb - she is still 8 lb above that. She is still volume overloaded on exam but feeling much better. It is thought that she has baseline edema but symptomatically she is markedly improved.  The patient has had a prolonged hospital course but is recovering well.  She has been seen by Dr. Sallyanne Kuster today and deemed ready for discharge home. A message was sent  to [place], the appointment coordinator at the [facility] office to make early followup appointment next week. She will call the patient with this appointment. Discharge medications are listed below. He will be discharged on torsemide 40mg  po BID and needs early follow up next week for further adjustment.    Discharge Vitals: Blood pressure 108/57, pulse 82, temperature 98.4 F (36.9 C),  temperature source Oral, resp. rate 20, height 5\' 2"  (1.575 m), weight 236 lb 5.3 oz (107.2 kg), SpO2 97.00%.  Labs: Lab Results  Component Value Date   WBC 2.7* 04/01/2014   HGB 8.7* 04/01/2014   HCT 27.1* 04/01/2014   MCV 88.9 04/01/2014   PLT 189 04/01/2014     Recent Labs Lab 04/10/14 0542  NA 137  K 3.9  CL 97  CO2 24  BUN 34*  CREATININE 1.68*  CALCIUM 9.4  GLUCOSE 160*     Diagnostic Studies/Procedures   Dg Chest 2 View  04/02/2014   CLINICAL DATA:  CHF and short of breath  EXAM: CHEST  2 VIEW  COMPARISON:  02/15/2014  FINDINGS: Chronic left rib deformity. Mild cardiomegaly. Normal vascularity. No pneumothorax or pleural effusion. No sign of pulmonary edema. Mild vascular congestion.  IMPRESSION: Cardiomegaly and vascular congestion without pulmonary edema.      Discharge Medications     Medication List         albuterol 108 (90 BASE) MCG/ACT inhaler  Commonly known as:  PROVENTIL HFA;VENTOLIN HFA  Inhale 2 puffs into the lungs 2 (two) times daily as needed for wheezing or shortness of breath.     aspirin 81 MG EC tablet  Take 1 tablet (81 mg total) by mouth daily.     atorvastatin 40 MG tablet  Commonly known as:  LIPITOR  Take 1 tablet (40 mg total) by mouth daily at 6 PM.     clotrimazole 1 % cream  Commonly known as:  LOTRIMIN  Apply 1 application topically daily.     HYDROcodone-acetaminophen 5-325 MG per tablet  Commonly known as:  NORCO/VICODIN  Take 2 tablets by mouth every 4 (four) hours as needed for moderate pain.     IBRANCE 125 MG capsule  Generic drug:  palbociclib  Take 125 mg by mouth See admin instructions. Take 125mg  every morning for 2 weeks, hold for a week, then repeat. Take whole with food.     isosorbide mononitrate 60 MG 24 hr tablet  Commonly known as:  IMDUR  Take 1 tablet (60 mg total) by mouth daily.     letrozole 2.5 MG tablet  Commonly known as:  FEMARA  Take 1 tablet (2.5 mg total) by mouth daily.     mirtazapine 15 MG  tablet  Commonly known as:  REMERON  Take 1 tablet (15 mg total) by mouth at bedtime.     naproxen sodium 220 MG tablet  Commonly known as:  ANAPROX  Take 220 mg by mouth daily as needed (for pain).     nitroGLYCERIN 0.4 MG SL tablet  Commonly known as:  NITROSTAT  Place 1 tablet (0.4 mg total) under the tongue every 5 (five) minutes as needed for chest pain.     NON FORMULARY  Place 3 L into the nose as needed (oxygen).     potassium chloride SA 20 MEQ tablet  Commonly known as:  K-DUR,KLOR-CON  Take 2 tablets (40 mEq total) by mouth 2 (two) times daily.     tiotropium 18 MCG inhalation capsule  Commonly known as:  SPIRIVA  Place 1 capsule (18 mcg total) into inhaler and inhale daily.     torsemide 20 MG tablet  Commonly known as:  DEMADEX  Take 2 tablets (40 mg total) by mouth 2 (two) times daily.     VITAMIN D (CHOLECALCIFEROL) PO  Take 1,000 mg by mouth daily.        Disposition   The patient will be discharged in stable condition to home. Discharge Instructions   Diet - low sodium heart healthy    Complete by:  As directed      Discharge instructions    Complete by:  As directed   [place].  IF YOU GAIN 3 POUNDS IN 24HOURS OR 5 POUNDS IN A WEEK, CALL THE OFFICE.     Increase activity slowly    Complete by:  As directed           Follow-up Information   Follow up with [facility]. (RN/PT/OT)    Specialty:  Home Health Services   Contact information:   [address] [place] [address] [phone]       Follow up with Minus Breeding, MD. (the office will call with the appt date and time. You need to be seen next week for adjustment of your fluid pills)    Specialty:  Cardiology   Contact information:   [address] [place] [place] [address] [phone]         Duration of Discharge Encounter: Greater than 30 minutes including physician and PA time.  SignedVertell Limber, KATHRYN PA-C 04/10/2014,  11:48 AM

## 2014-04-10 NOTE — Progress Notes (Signed)
Patient Name: Anita [name] Date of Encounter: 04/10/2014  Principal Problem:   Acute diastolic HF (heart failure) Active Problems:   DM2 (diabetes mellitus, type 2)   COPD (chronic obstructive pulmonary disease)   Chronic respiratory failure   Chronic combined systolic and diastolic congestive heart failure   CAD (coronary artery disease)   Chronic kidney disease, stage III (moderate)   Breast cancer metastasized to bone   Length of Stay: 9  SUBJECTIVE  No dyspnea. Edema improved. > 1L net negative balance on oral diuretics lat 24h Weight decreased 6.5 lb since admission - big discrepancy compared to reported 17 L net diuresis  CURRENT MEDS . aspirin EC  81 mg Oral Daily  . atorvastatin  40 mg Oral q1800  . heparin  5,000 Units Subcutaneous 3 times per day  . isosorbide mononitrate  60 mg Oral Daily  . letrozole  2.5 mg Oral Daily  . mirtazapine  15 mg Oral QHS  . potassium chloride  40 mEq Oral BID  . sodium chloride  3 mL Intravenous Q12H  . tiotropium  18 mcg Inhalation Daily  . torsemide  40 mg Oral BID    OBJECTIVE   Intake/Output Summary (Last 24 hours) at 04/10/14 1043 Last data filed at 04/10/14 0900  Gross per 24 hour  Intake   1200 ml  Output   2302 ml  Net  -1102 ml   Filed Weights   04/08/14 0553 04/09/14 0356 04/10/14 0438  Weight: 240 lb 4.8 oz (109 kg) 235 lb 4.8 oz (106.731 kg) 236 lb 5.3 oz (107.2 kg)    PHYSICAL EXAM Filed Vitals:   04/09/14 1320 04/09/14 2140 04/10/14 0438 04/10/14 0858  BP: 97/45 126/47 108/57   Pulse: 68 85 82   Temp: 98.6 F (37 C) 97.4 F (36.3 C) 98.4 F (36.9 C)   TempSrc: Oral Oral Oral   Resp: 18 18 20    Height:      Weight:   236 lb 5.3 oz (107.2 kg)   SpO2: 97% 100% 100% 97%   General: Alert, oriented x3, no distress Head: no evidence of trauma, PERRL, EOMI, no exophtalmos or lid lag, no myxedema, no xanthelasma; normal ears, nose and oropharynx Neck: hard to evaluate jugular venous pulsations and no  hepatojugular reflux; brisk carotid pulses without delay and no carotid bruits Chest: clear to auscultation, no signs of consolidation by percussion or palpation, normal fremitus, symmetrical and full respiratory excursions Cardiovascular: normal position and quality of the apical impulse, regular rhythm, normal first and second heart sounds, no rubs or gallops, no murmur Abdomen: no tenderness or distention, no masses by palpation, no abnormal pulsatility or arterial bruits, normal bowel sounds, no hepatosplenomegaly Extremities: no clubbing, cyanosis; still has 1-2+ pretibial pitting edema almost to her knees; 2+ radial, ulnar and brachial pulses bilaterally; 2+ right femoral, posterior tibial and dorsalis pedis pulses; 2+ left femoral, posterior tibial and dorsalis pedis pulses; no subclavian or femoral bruits Neurological: grossly nonfocal  LABS Basic Metabolic Panel  Recent Labs  04/09/14 0434 04/10/14 0542  NA 140 137  K 3.9 3.9  CL 98 97  CO2 27 24  GLUCOSE 161* 160*  BUN 31* 34*  CREATININE 1.65* 1.68*  CALCIUM 8.9 9.4     Imaging results have been reviewed and No results found.  TELE Sinus rhytgm  ECG SR, IVCD  ASSESSMENT AND PLAN Improved acute on chronic diastolic HF - still hypervolemic, but dyspnea resolved DC home Still with negative fluid balance  on current diuretic dose, with normal K and stable renal function Needs early F/u - next week for further diuretic dose adjustment Previous "dry weight" at hospital DC was 228 lb - she is still 8 lb above that   Sanda Klein, MD, [facility] [phone] office [phone] pager 04/10/2014 10:43 AM

## 2014-04-15 ENCOUNTER — Telehealth: Payer: Self-pay | Admitting: Cardiology

## 2014-04-16 NOTE — Telephone Encounter (Signed)
Closed encounter °

## 2014-04-21 ENCOUNTER — Telehealth: Payer: Self-pay | Admitting: Family Medicine

## 2014-04-21 NOTE — Telephone Encounter (Signed)
[facility] nurse called to report that patient is gaining a average of 2 lbs a day and now is up to 237 lbs. She said that the patient is drinking 2 sodas a day and eating a lot of food high in sodium. jw

## 2014-04-22 ENCOUNTER — Telehealth: Payer: Self-pay | Admitting: Family Medicine

## 2014-04-22 NOTE — Telephone Encounter (Signed)
Left message for patient to return call, please read message below.[place], Robert [place]  

## 2014-04-22 NOTE — Telephone Encounter (Signed)
Please advise.Thank you.Anita [name] S  

## 2014-04-22 NOTE — Telephone Encounter (Signed)
Pt calls requesting a "change of status" to [facility] for more hours of PCS. Pt requesting 72 hrs.

## 2014-04-22 NOTE — Telephone Encounter (Signed)
I am covering for Dr. Lacinda Axon while he is on vacation. As he knows Ms. Koval very well, I will defer this decision making to him when he returns.  Leeanne Rio, MD

## 2014-04-22 NOTE — Telephone Encounter (Signed)
Please call patient and instruct her to follow up with her cardiologist as soon as possible. She was recently admitted for volume overload and was supposed to follow up some time this week but does not appear to have done so. Alternatively she can come in for a visit at the [facility].  Leeanne Rio, MD

## 2014-04-23 ENCOUNTER — Telehealth: Payer: Self-pay | Admitting: Clinical

## 2014-04-23 ENCOUNTER — Telehealth: Payer: Self-pay | Admitting: Cardiology

## 2014-04-23 NOTE — Telephone Encounter (Signed)
PCS form done and given to [facility].

## 2014-04-23 NOTE — Telephone Encounter (Signed)
Pt called in wanting to know if she should increase her dosage of Torsemide to get the fluid off of her legs. Please call  Thanks

## 2014-04-23 NOTE — Telephone Encounter (Signed)
CSW left a message for pt letting her know that CSW has faxed her application for PCS (additional hours) to [employer].  Hunt Oris, MSW, [place]

## 2014-04-23 NOTE — Telephone Encounter (Signed)
Returned call to patient. She reports gaining 1-2 lbs every day for past week. Her hospital discharge weight was 236lbs and she reports her weight today is 239lbs.   She complains of worsening bilateral LE edema  She DENIES shortness of breath, chest pain, lightheadedness/dizziness  She takes torsemide 40mg  BID and K+ 73mEq BID  Message deferred to DOD (Dr. Martinique), Dr. Percival Spanish, JC, LPN to advise

## 2014-04-23 NOTE — Telephone Encounter (Signed)
Yes, increase Torsemide to 60 mg bid. Call if no improvement by Monday.  Peter Martinique MD, [facility]

## 2014-04-23 NOTE — Telephone Encounter (Signed)
Message on med instructions communicated to patient. She voiced understanding and will call on Monday with update

## 2014-05-04 ENCOUNTER — Other Ambulatory Visit: Payer: Self-pay | Admitting: Family Medicine

## 2014-05-07 ENCOUNTER — Telehealth: Payer: Self-pay | Admitting: Hematology

## 2014-05-07 NOTE — Telephone Encounter (Signed)
pt left VM to r/s appt missed in Aug-gave pt appt time & date-pt understood

## 2014-05-12 ENCOUNTER — Ambulatory Visit: Payer: Commercial Managed Care - HMO | Admitting: Cardiology

## 2014-05-14 ENCOUNTER — Other Ambulatory Visit: Payer: Self-pay | Admitting: Internal Medicine

## 2014-05-19 ENCOUNTER — Telehealth: Payer: Self-pay | Admitting: Family Medicine

## 2014-05-19 ENCOUNTER — Other Ambulatory Visit: Payer: Self-pay | Admitting: *Deleted

## 2014-05-19 DIAGNOSIS — C7951 Secondary malignant neoplasm of bone: Principal | ICD-10-CM

## 2014-05-19 DIAGNOSIS — C50919 Malignant neoplasm of unspecified site of unspecified female breast: Secondary | ICD-10-CM

## 2014-05-19 MED ORDER — MIRTAZAPINE 15 MG PO TABS: 15.0000 mg | ORAL_TABLET | Freq: Every day | ORAL | Status: DC

## 2014-05-19 MED ORDER — TORSEMIDE 20 MG PO TABS: 40.0000 mg | ORAL_TABLET | Freq: Two times a day (BID) | ORAL | Status: DC

## 2014-05-19 NOTE — Telephone Encounter (Signed)
Pt called and needs refill on her Torsemide 20 mg called in to [place] at [facility] and also to [facility]. jw

## 2014-05-19 NOTE — Telephone Encounter (Signed)
Rx refilled.

## 2014-05-27 ENCOUNTER — Other Ambulatory Visit: Payer: Self-pay | Admitting: *Deleted

## 2014-05-28 MED ORDER — TORSEMIDE 20 MG PO TABS: 40.0000 mg | ORAL_TABLET | Freq: Two times a day (BID) | ORAL | Status: DC

## 2014-06-04 ENCOUNTER — Ambulatory Visit (INDEPENDENT_AMBULATORY_CARE_PROVIDER_SITE_OTHER): Payer: Commercial Managed Care - HMO | Admitting: Cardiology

## 2014-06-04 ENCOUNTER — Encounter: Payer: Self-pay | Admitting: Cardiology

## 2014-06-04 VITALS — BP 118/60 | HR 102 | Ht 62.0 in | Wt 248.0 lb

## 2014-06-04 DIAGNOSIS — I5033 Acute on chronic diastolic (congestive) heart failure: Secondary | ICD-10-CM

## 2014-06-04 NOTE — Progress Notes (Signed)
HPI  The patient presents for follow up after a hospitalization for treatment of dyspnea and anasarca. She has had multiple admissions for this with difficulty managing her as an outpatient despite her compliance and understanding. She was most recently discharged in August. I reviewed the most recent hospitalization notes and labs today. Review these records today.  Unfortunately despite a prolonged hospitalization recently she did not have significant improvement in her volume. Her weight was up when she came in and is about the same today. She has continued lower extremity swelling. She takes her medications. She actually is taking an increased dose of torsemide recently. She's chronically dyspneic but she's not having any PND or orthopnea. She has trouble sleeping flat because of a pathologic fracture in her arm. She denies any new palpitations, presyncope or syncope.   Allergies  Allergen Reactions  . Omnipaque [Iohexol]     Pt claims she developed hives after given contrast  . Benzene Rash    Current Outpatient Prescriptions  Medication Sig Dispense Refill  . albuterol (PROVENTIL HFA;VENTOLIN HFA) 108 (90 BASE) MCG/ACT inhaler Inhale 2 puffs into the lungs 2 (two) times daily as needed for wheezing or shortness of breath.       [name] aspirin EC 81 MG EC tablet Take 1 tablet (81 mg total) by mouth daily.  30 tablet  3  . atorvastatin (LIPITOR) 40 MG tablet TAKE 1 TABLET EVERY DAY AT BEDTIME  90 tablet  6  . clotrimazole (LOTRIMIN) 1 % cream Apply 1 application topically daily.      [name] HYDROcodone-acetaminophen (NORCO/VICODIN) 5-325 MG per tablet Take 2 tablets by mouth every 4 (four) hours as needed for moderate pain.  30 tablet  0  . isosorbide mononitrate (IMDUR) 60 MG 24 hr tablet Take 1 tablet (60 mg total) by mouth daily.  30 tablet  5  . letrozole (FEMARA) 2.5 MG tablet Take 1 tablet (2.5 mg total) by mouth daily.  90 tablet  3  . mirtazapine (REMERON) 15 MG tablet Take 1 tablet (15  mg total) by mouth at bedtime.  30 tablet  0  . naproxen sodium (ANAPROX) 220 MG tablet Take 220 mg by mouth daily as needed (for pain).      . nitroGLYCERIN (NITROSTAT) 0.4 MG SL tablet Place 1 tablet (0.4 mg total) under the tongue every 5 (five) minutes as needed for chest pain.  30 tablet  0  . NON FORMULARY Place 3 L into the nose as needed (oxygen).       . palbociclib (IBRANCE) 125 MG capsule Take 125 mg by mouth See admin instructions. Take 125mg  every morning for 2 weeks, hold for a week, then repeat. Take whole with food.      . potassium chloride SA (K-DUR,KLOR-CON) 20 MEQ tablet Take 2 tablets (40 mEq total) by mouth 2 (two) times daily.  60 tablet  5  . tiotropium (SPIRIVA) 18 MCG inhalation capsule Place 1 capsule (18 mcg total) into inhaler and inhale daily.  30 capsule  6  . torsemide (DEMADEX) 20 MG tablet Take 2 tablets (40 mg total) by mouth 2 (two) times daily.  120 tablet  5  . VITAMIN D, CHOLECALCIFEROL, PO Take 1,000 mg by mouth daily.        No current facility-administered medications for this visit.    Past Medical History  Diagnosis Date  . Fibroid   . Morbid obesity   . 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 COPD (chronic obstructive pulmonary disease)   . Chronic diastolic CHF (congestive heart failure)   . NSTEMI (non-ST elevated myocardial infarction)     Cath November 2014 LAD 40% stenosis, first diagonal 80% stenosis, circumflex 20% stenosis, right coronary artery occluded. The EF was 35-40% at that time.  . Anemia     a. Adm 09/2012 with melena, Hgb 5.8 -> transfused. EGD/colonoscopy unrevealing.  . Breast cancer     a. Mets to bone. ER 100%/ PR 0%/Her2 neu negative.  [name] Ulcers of both lower legs   . Tobacco abuse   . Hyperlipidemia 02/11/2013  . Chronic respiratory failure     a. On O2 qhs. also portable O2.  . Chronic renal insufficiency   . Lesion of vocal cord     a. CT 05/2013 concerning for tumor.  .  Shortness of breath   . Depression   . Diabetes mellitus without complication   . Anginal pain   . Dementia     very mild  . Radiation 02/02/14-02/24/14    left and right femur 30 gray    Past Surgical History  Procedure Laterality Date  . Colonoscopy, esophagogastroduodenoscopy (egd) and esophageal dilation N/A 10/13/2012    Procedure: colonoscopy and egd;  Surgeon: Wonda Horner, MD;  Location: [facility];  Service: Endoscopy;  Laterality: N/A;    ROS:  As stated in the HPI and negative for all other systems.  PHYSICAL EXAM BP 118/60  Pulse 102  Ht $R'5\' 2"'vH$  (1.575 m)  Wt 248 lb (112.492 kg)  BMI 45.35 kg/m2 GEN:  No distress NECK:  No jugular venous distention at 90 degrees, waveform within normal limits, carotid upstroke brisk and symmetric, no bruits, no thyromegaly LYMPHATICS:  No cervical adenopathy LUNGS:  Few basilar crackles and BACK:  No CVA tenderness CHEST:  Unremarkable HEART:  S1 and S2 within normal limits, no S3, no S4, no clicks, no rubs, distant heart sounds murmurs ABD:  Positive bowel sounds normal in frequency in pitch, no bruits, no rebound, no guarding, unable to assess midline mass or bruit with the patient seated. EXT:  2 plus pulses throughout, severe bilateral lower extremity edema, no cyanosis no clubbing SKIN:  No rashes no nodules NEURO:  Cranial nerves II through XII grossly intact, motor grossly intact throughout PSYCH:  Cognitively intact, oriented to person place and time  EKG:  Sinus rhythm, rate 102, axis within normal limits, intervals within normal limits, no acute ST-T wave changes.  06/04/2014  ASSESSMENT AND PLAN   CHRNOIC DIASTOLIC HF:  I reviewed the records in today. Unfortunately her volume is significant again. For now she like to stay out of the hospital although she will call us soon if her edema worsens and she should be a direct admit for IV diuresis. Until then she's going to work artery keeping her feet elevated and restricting  salt. We talked specifically about the volume of water she can drink. She's going to increase her Demadex to 80 mg in the morning. He needs a basic metabolic profile today and early next week.   CAD:  The patient has no new sypmtoms.  No further cardiovascular testing is indicated.  We will continue with aggressive risk reduction and meds as listed.   Hospital records reviewed today.

## 2014-06-04 NOTE — Patient Instructions (Addendum)
Your physician recommends that you schedule a follow-up appointment in: 2 weeks with a PA or NP  We have ordered lab work for you to get done today and then get the same lab work done next week  Take Torsemide 80 mg in am and 40 mg in pm

## 2014-06-05 LAB — BASIC METABOLIC PANEL
BUN: 34 mg/dL — ABNORMAL HIGH (ref 6–23)
CALCIUM: 9.2 mg/dL (ref 8.4–10.5)
CO2: 27 meq/L (ref 19–32)
Chloride: 104 mEq/L (ref 96–112)
Creat: 1.81 mg/dL — ABNORMAL HIGH (ref 0.50–1.10)
GLUCOSE: 135 mg/dL — AB (ref 70–99)
POTASSIUM: 3.9 meq/L (ref 3.5–5.3)
Sodium: 144 mEq/L (ref 135–145)

## 2014-06-07 ENCOUNTER — Other Ambulatory Visit: Payer: Self-pay | Admitting: *Deleted

## 2014-06-07 ENCOUNTER — Ambulatory Visit (HOSPITAL_COMMUNITY)
Admission: RE | Admit: 2014-06-07 | Discharge: 2014-06-07 | Disposition: A | Discharge status: Home / Self Care | Payer: Medicare HMO | Source: Ambulatory Visit | Attending: Internal Medicine | Admitting: Internal Medicine

## 2014-06-07 DIAGNOSIS — C50911 Malignant neoplasm of unspecified site of right female breast: Secondary | ICD-10-CM | POA: Diagnosis present

## 2014-06-07 DIAGNOSIS — E878 Other disorders of electrolyte and fluid balance, not elsewhere classified: Secondary | ICD-10-CM

## 2014-06-07 DIAGNOSIS — C7951 Secondary malignant neoplasm of bone: Secondary | ICD-10-CM | POA: Insufficient documentation

## 2014-06-08 ENCOUNTER — Other Ambulatory Visit (HOSPITAL_BASED_OUTPATIENT_CLINIC_OR_DEPARTMENT_OTHER): Payer: Commercial Managed Care - HMO

## 2014-06-08 ENCOUNTER — Ambulatory Visit (HOSPITAL_BASED_OUTPATIENT_CLINIC_OR_DEPARTMENT_OTHER): Payer: Commercial Managed Care - HMO | Admitting: Hematology

## 2014-06-08 ENCOUNTER — Telehealth: Payer: Self-pay | Admitting: Hematology

## 2014-06-08 ENCOUNTER — Encounter: Payer: Self-pay | Admitting: Hematology

## 2014-06-08 ENCOUNTER — Ambulatory Visit (HOSPITAL_BASED_OUTPATIENT_CLINIC_OR_DEPARTMENT_OTHER): Payer: Commercial Managed Care - HMO

## 2014-06-08 VITALS — BP 139/53 | HR 88 | Temp 98.4°F | Resp 18 | Ht 62.0 in | Wt 250.6 lb

## 2014-06-08 DIAGNOSIS — C7800 Secondary malignant neoplasm of unspecified lung: Secondary | ICD-10-CM

## 2014-06-08 DIAGNOSIS — L03115 Cellulitis of right lower limb: Secondary | ICD-10-CM

## 2014-06-08 DIAGNOSIS — C50911 Malignant neoplasm of unspecified site of right female breast: Secondary | ICD-10-CM

## 2014-06-08 DIAGNOSIS — D709 Neutropenia, unspecified: Secondary | ICD-10-CM

## 2014-06-08 DIAGNOSIS — C50919 Malignant neoplasm of unspecified site of unspecified female breast: Secondary | ICD-10-CM

## 2014-06-08 DIAGNOSIS — C7951 Secondary malignant neoplasm of bone: Secondary | ICD-10-CM

## 2014-06-08 DIAGNOSIS — L03116 Cellulitis of left lower limb: Secondary | ICD-10-CM

## 2014-06-08 DIAGNOSIS — M8440XA Pathological fracture, unspecified site, initial encounter for fracture: Secondary | ICD-10-CM

## 2014-06-08 DIAGNOSIS — N189 Chronic kidney disease, unspecified: Secondary | ICD-10-CM

## 2014-06-08 LAB — COMPREHENSIVE METABOLIC PANEL (CC13)
ALBUMIN: 3.1 g/dL — AB (ref 3.5–5.0)
ALK PHOS: 98 U/L (ref 40–150)
ALT: 21 U/L (ref 0–55)
ANION GAP: 13 meq/L — AB (ref 3–11)
AST: 17 U/L (ref 5–34)
BUN: 49.5 mg/dL — AB (ref 7.0–26.0)
CALCIUM: 9.6 mg/dL (ref 8.4–10.4)
CHLORIDE: 104 meq/L (ref 98–109)
CO2: 26 mEq/L (ref 22–29)
Creatinine: 2.1 mg/dL — ABNORMAL HIGH (ref 0.6–1.1)
GLUCOSE: 163 mg/dL — AB (ref 70–140)
Potassium: 3.9 mEq/L (ref 3.5–5.1)
SODIUM: 143 meq/L (ref 136–145)
TOTAL PROTEIN: 6.6 g/dL (ref 6.4–8.3)
Total Bilirubin: 0.31 mg/dL (ref 0.20–1.20)

## 2014-06-08 LAB — CBC WITH DIFFERENTIAL/PLATELET
BASO%: 0.8 % (ref 0.0–2.0)
BASOS ABS: 0 10*3/uL (ref 0.0–0.1)
EOS%: 2.1 % (ref 0.0–7.0)
Eosinophils Absolute: 0.1 10*3/uL (ref 0.0–0.5)
HCT: 25 % — ABNORMAL LOW (ref 34.8–46.6)
HEMOGLOBIN: 8.1 g/dL — AB (ref 11.6–15.9)
LYMPH%: 11.5 % — ABNORMAL LOW (ref 14.0–49.7)
MCH: 30.1 pg (ref 25.1–34.0)
MCHC: 32.3 g/dL (ref 31.5–36.0)
MCV: 93.1 fL (ref 79.5–101.0)
MONO#: 0.2 10*3/uL (ref 0.1–0.9)
MONO%: 7 % (ref 0.0–14.0)
NEUT#: 2 10*3/uL (ref 1.5–6.5)
NEUT%: 78.6 % — ABNORMAL HIGH (ref 38.4–76.8)
Platelets: 230 10*3/uL (ref 145–400)
RBC: 2.69 10*6/uL — ABNORMAL LOW (ref 3.70–5.45)
RDW: 18.3 % — ABNORMAL HIGH (ref 11.2–14.5)
WBC: 2.6 10*3/uL — AB (ref 3.9–10.3)
lymph#: 0.3 10*3/uL — ABNORMAL LOW (ref 0.9–3.3)

## 2014-06-08 LAB — LACTATE DEHYDROGENASE (CC13): LDH: 197 U/L (ref 125–245)

## 2014-06-08 MED ORDER — FILGRASTIM 300 MCG/0.5ML IJ SOLN
300.0000 ug | Freq: Once | INTRAMUSCULAR | Status: AC
  Administered 2014-06-08: 300 ug via SUBCUTANEOUS
  Filled 2014-06-08: qty 0.5

## 2014-06-08 MED ORDER — DENOSUMAB 120 MG/1.7ML ~~LOC~~ SOLN
120.0000 mg | Freq: Once | SUBCUTANEOUS | Status: AC
  Administered 2014-06-08: 120 mg via SUBCUTANEOUS
  Filled 2014-06-08: qty 1.7

## 2014-06-08 NOTE — Progress Notes (Signed)
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 Biopsy only. Added Palbociclib 125 mg daily for 21 days on and 7 days off on 01/01/2014. Dose modified to 2 weeks on 1 week off with cycle 3.  Adjunctive zometa added on 01/01/2014.  Started palliative XRT to the left and right femur (30 Gy in 10 fractions) on 02/02/2014 per Dr. Gery Pray which was finished on 02/24/2014. Zometa held due to renal insufficiency. I will start Delton See today because of skeletal disease progression and safe renal profile.  INTERVAL HISTORY:  Anita [name] 68 y.o. female with a history of right breast cancer with metastases to the bone/lungs here for 1 month followup.  She was last seen by Dr Elson Areas on me on 03/04/2014.    Today, she came by herself.   She continues to recover from her recent right arm pathologic fracture.  She notes irritation of her legs posteriorly bilaterally (worse on the left).   She was admitted to family medicine teaching service on 06/22 and discharged on 6/25 with worsening lower extremity edema.  Palliative care was consulted a nd patient was agreeable to continue radiation therapy and endocrine therapy plus palbociclib for now.  She continues to have a hoarse voice.  She notes some improvement in her generalized bone pain s/p completion of her XRT.   She will have the right splint removed by orthopedics next month. She is on her off week for palbociclib. She starts it again on 06/16/2015 2 weeks on 1 week off. Her white count was low at 2600 and [place] 2000 so I am giving her a boost of Neupogen to allow white cell recovery. She is also starting XGEVA today and we discussed the side effects not limited to low calcium,  low phosphorous, risk for ONJ.  She had a cat scan done CT chest abdomen and pelvis for re-staging on 06/07/2014 which I reviewed.   CLINICAL DATA: metastatic breast cancer Breast cancer metastasized to bone, right C50.911, C79.51 (ICD-10-CM). Subsequent encounter. EXAM:  CT CHEST, ABDOMEN AND PELVIS WITHOUT CONTRAST TECHNIQUE: Multidetector CT imaging of the chest, abdomen and pelvis was performed following the standard protocol without IV contrast. COMPARISON: PET 08/10/2013. Chest radiograph of 04/02/2014. Prior diagnostic CTs of 05/13/2013.  FINDINGS: CT CHEST FINDINGS Lungs/Pleura: Mild motion degradation. Subpleural right lower lobe 5 mm nodule on image 22, unchanged. 4 mm left upper lobe nodule on image 24 is similar. The right lower lobe nodule described on the prior exam may be identified at 4 mm on image 22. No pleural fluid.  Heart/Mediastinum: Left thyroid enlargement, similar. No axillary adenopathy. Mild cardiomegaly. Atherosclerosis, including within the coronary arteries. No pericardial effusion. No mediastinal or definite hilar adenopathy, given limitations of unenhanced CT. CT ABDOMEN AND PELVIS FINDINGS Motion degradation continuing into the abdomen and pelvis. Hepatobiliary: Marked hepatic steatosis. Normal gallbladder, without biliary ductal dilatation. Pancreas: Normal, without mass or pancreatic ductal dilatation. Spleen: Normal Urinary tract: Normal adrenal glands. Staghorn left renal calculus. Maximally 3.2 cm. Mild left renal cortical thinning. No gross hydronephrosis. Normal right kidney. No bladder calculi. Stomach/Bowel: Normal stomach, without wall thickening. Normal terminal ileum and appendix. Normal small bowel without abdominal ascites. Vascular/Lymphatic: Aortic and branch vessel atherosclerosis. Preaortic node measures 9 mm on  image 64 versus 7 mm on the prior. The left periaortic node measures 1.0 cm and maintains its fatty hilum. Unchanged. Prominent inguinal nodes are  similar and likely reactive. Reproductive: Dystrophic calcifications within the uterus likely relate to underlying fibroids. No adnexal mass.Other: No evidence of omental or peritoneal disease. No significant free fluid. Musculoskeletal: Widespread osseous metastasis, as evidenced by heterogeneous marrow density. More well-defined lucent lesions today. Example 2.0 cm left acetabular lesion versus 1.1 cm on 05/13/2013. Eccentric right L3 vertebral body lesion on image 68 of series 2.  New lytic lesion in the right humeral head on image 5. Interval healing left-sided rib fractures, likely pathologic.  CT CHEST IMPRESSION 1. Motion degraded exam. 2. No evidence of extraosseous metastatic disease within the chest. 3. Similar nonspecific pulmonary nodules. 4. Progressive osseous metastasis.  CT ABDOMEN AND PELVIS IMPRESSION 1. Motion degraded exam. 2. Slight enlargement of a preaortic node which is indeterminate. Recommend attention on follow-up. 3. Progressive osseous metastasis, including development of multiple lytic lesions and presumably pathologic left rib fractures. The vertebral body lesions could predispose the patient to cord compression. If there is any such symptoms, pre and post contrast spine MRI should be considered. Left acetabular lesion could predispose the patient to pathologic fracture 4. Left sided staghorn calculus. 5. Hepatic steatosis. Electronically Signed By: Jeronimo Greaves M.D. On: 06/07/2014 16:14   MEDICAL HISTORY: Past Medical History  Diagnosis Date  . Fibroid   . Morbid obesity   . 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 COPD (chronic obstructive pulmonary disease)   . Chronic diastolic CHF (congestive heart failure)   . NSTEMI (non-ST elevated myocardial infarction)     Cath November 2014 LAD 40% stenosis, first diagonal 80% stenosis, circumflex 20% stenosis, right coronary artery occluded. The EF was 35-40% at that time.  . Anemia     a.  Adm 09/2012 with melena, Hgb 5.8 -> transfused. EGD/colonoscopy unrevealing.  . Breast cancer     a. Mets to bone. ER 100%/ PR 0%/Her2 neu negative.  [name] Ulcers of both lower legs   . Tobacco abuse   . Hyperlipidemia 02/11/2013  . Chronic respiratory failure     a. On O2 qhs. also portable O2.  . Chronic renal insufficiency   . Lesion of vocal cord     a. CT 05/2013 concerning for tumor.  . Shortness of breath   . Depression   . Diabetes mellitus without complication   . Anginal pain   . Dementia     very mild  . Radiation 02/02/14-02/24/14    left and right femur 30 gray   INTERIM HISTORY: has DM2 (diabetes mellitus, type 2); COPD (chronic obstructive pulmonary disease); Chronic respiratory failure; Chronic combined systolic and diastolic congestive heart failure; CAD (coronary artery disease); Chronic kidney disease, stage III (moderate); Anemia of chronic disease; Breast mass, right; History of tobacco abuse; Breast cancer metastasized to bone; Hyperlipidemia; Lesion of vocal cord; Closed right arm fracture; Lower extremity weakness; Intertrigo; Pressure ulcer stage II; and Acute diastolic HF (heart failure) on her problem list.    ALLERGIES:  is allergic to omnipaque and benzene.  MEDICATIONS: has a current medication list which includes the following prescription(s): albuterol, aspirin, atorvastatin, clotrimazole, hydrocodone-acetaminophen, isosorbide mononitrate, letrozole, mirtazapine, naproxen sodium, nitroglycerin, NON FORMULARY, palbociclib, potassium chloride sa, tiotropium, torsemide, and cholecalciferol.  SURGICAL HISTORY:  Past Surgical History  Procedure Laterality Date  . Colonoscopy, esophagogastroduodenoscopy (egd) and esophageal dilation N/A  10/13/2012    Procedure: colonoscopy and egd;  Surgeon: Wonda Horner, MD;  Location: [facility] ENDOSCOPY;  Service: Endoscopy;  Laterality: N/A;   REVIEW OF SYSTEMS:   Constitutional: Denies fevers, chills or abnormal weight loss Eyes:  Denies blurriness of vision Ears, nose, mouth, throat, and face: Denies mucositis or sore throat Respiratory: Denies cough, dyspnea or wheezes Cardiovascular: Denies palpitation, chest discomfort or lower extremity swelling Gastrointestinal:  Denies nausea, heartburn or change in bowel habits Skin: Denies abnormal skin rashes Lymphatics: Denies new lymphadenopathy or easy bruising Neurological:Denies numbness, tingling or new weaknesses Behavioral/Psych: Mood is stable, no new changes  All other systems were reviewed with the patient and are negative.  PHYSICAL EXAMINATION: ECOG PERFORMANCE STATUS: 1  Blood pressure 139/53, pulse 88, temperature 98.4 F (36.9 C), temperature source Oral, resp. rate 18, height $RemoveBe'5\' 2"'xUITULxIX$  (1.575 m), weight 250 lb 9.6 oz (113.671 kg).  GENERAL:alert, no distress and comfortable morbidly obese woman, chronically ill appearing SKIN: skin color, texture, turgor are normal; posterior legs with irritation and erythema with a small abrasion on the posterior left thigh with mild TTP.  EYES: normal, Conjunctiva are pink and non-injected, sclera clear OROPHARYNX:no exudate, no erythema and lips, buccal mucosa, and tongue normal  NECK: supple, thyroid normal size, non-tender, without nodularity LYMPH:  no palpable lymphadenopathy in the cervical, axillary LUNGS: coarse breathing with slightly increased breathing rate HEART: regular rate & rhythm and no murmurs and 2+ lower extremity edema BREAST: deferred ABDOMEN:abdomen soft, non-tender and normal bowel sounds; obese Musculoskeletal:no cyanosis of digits and no clubbing; R arm in a brace NEURO: alert & oriented x 3 with fluent speech, no focal motor/sensory deficits  LABORATORY DATA: CBC    Component Value Date/Time   WBC 2.6* 06/08/2014 1231   WBC 2.7* 04/01/2014 2214   RBC 2.69* 06/08/2014 1231   RBC 3.05* 04/01/2014 2214   RBC 3.18* 09/26/2012 0518   HGB 8.1* 06/08/2014 1231   HGB 8.7* 04/01/2014 2214   HCT 25.0*  06/08/2014 1231   HCT 27.1* 04/01/2014 2214   PLT 230 06/08/2014 1231   PLT 189 04/01/2014 2214   MCV 93.1 06/08/2014 1231   MCV 88.9 04/01/2014 2214   MCH 30.1 06/08/2014 1231   MCH 28.5 04/01/2014 2214   MCHC 32.3 06/08/2014 1231   MCHC 32.1 04/01/2014 2214   RDW 18.3* 06/08/2014 1231   RDW 24.6* 04/01/2014 2214   LYMPHSABS 0.3* 06/08/2014 1231   LYMPHSABS 0.5* 02/18/2014 0517   MONOABS 0.2 06/08/2014 1231   MONOABS 0.7 02/18/2014 0517   EOSABS 0.1 06/08/2014 1231   EOSABS 0.1 02/18/2014 0517   BASOSABS 0.0 06/08/2014 1231   BASOSABS 0.0 02/18/2014 0517   CMP     Component Value Date/Time   NA 143 06/08/2014 1231   NA 144 06/04/2014 1549   K 3.9 06/08/2014 1231   K 3.9 06/04/2014 1549   CL 104 06/04/2014 1549   CL 101 02/11/2013 0950   CO2 26 06/08/2014 1231   CO2 27 06/04/2014 1549   GLUCOSE 163* 06/08/2014 1231   GLUCOSE 135* 06/04/2014 1549   GLUCOSE 143* 02/11/2013 0950   BUN 49.5* 06/08/2014 1231   BUN 34* 06/04/2014 1549   CREATININE 2.1* 06/08/2014 1231   CREATININE 1.81* 06/04/2014 1549   CREATININE 1.68* 04/10/2014 0542   CALCIUM 9.6 06/08/2014 1231   CALCIUM 9.2 06/04/2014 1549   CALCIUM 6.6* 09/29/2012 1118   PROT 6.6 06/08/2014 1231   PROT 6.5 04/01/2014 2214   ALBUMIN 3.1* 06/08/2014  1231   ALBUMIN 3.3* 04/01/2014 2214   AST 17 06/08/2014 1231   AST 43* 04/01/2014 2214   ALT 21 06/08/2014 1231   ALT 42* 04/01/2014 2214   ALKPHOS 98 06/08/2014 1231   ALKPHOS 113 04/01/2014 2214   BILITOT 0.31 06/08/2014 1231   BILITOT 0.3 04/01/2014 2214   GFRNONAA 31* 04/10/2014 0542   GFRAA 36* 04/10/2014 0542   Results for FLORIS, NEUHAUS (MRN [id number]) as of 03/05/2014 08:44  Ref. Range 12/30/2013 11:33  Iron Latest Range: 42-135 ug/dL 38 (L)  UIBC Latest Range: 125-400 ug/dL 265  TIBC Latest Range: 250-470 ug/dL 303  %SAT Latest Range: 21-57 % 12 (L)  Ferritin Latest Range: 10-291 ng/mL 139    RADIOGRAPHIC STUDIES: CT scan reviewed above  ASSESSMENT: Metastatic breast cancer on palliative  letrozole plus palbociclib, with mild progression seen on skeletal system but not visceral disease.   PLAN:  1. Acute right distal oblique fracture (12/31/2013). --Likely secondary to number #3. We  started zometa q 4 weeks (first dose on 01/01/2014) to reduce further skeletal events.  Patient had declined previously.  She consented understanding indications, benefits and risks including but not limited to nephrotoxicity, hypocalcemia, osteoradionecrosis of the jaw.  Her arm is in a brace.  She has not received additional doses due to a rise in her creatinine which was likely multifactorial in nature.  I am placing her on xgeva for bone pain control, reduce risk for pathological fractures and for anti-cancer properties in bones.    2. Bone Pain secondary #3, improving. --Started palliative XRT to left/right femur on 02/02/2014 per Dr. Sondra Come.  She  received 30 Gy in 10 fractions. It ended on 02/24/2014.  She reports moderate improvement.she follows again with Radiation Oncology on 07/12/14.  3. Bilateral posterior leg cellulitis. --We will start a 10-day treatment with doxycycline 100 mg bid.  Patient counseled on frequent turning to avoid pressure ulcers.     4.  Metastatic breast cancer (ER pos/PR pos/ HER2 neg) with spread to bone, lungs.  -Clinically, she has had a response with stable breast mass. However, her recent CT of Neck and arm x-rays show progressive bone lesions.  Further, her  CT of Chest showed a new lung nodule.  She is tolerating letrozole well without bone/muscle pain, hot flash/night sweat.  We added palbociclib based on (Finn, 2015)  She takes 125 mg daily for 21 days then 7 days off with her letrozole daily.  Previously, we discussed extensively the indications, benefits and risks including but not limited to abnormal absolute lymphocyte count, neutropenia, leukopenia, thrombocytopenia, weakness, alopecia and fatigue and nausea.  She understood these risks and agree to proceed.   She is on day #22 with a drop in her hematological counts.  Given persistent low counts we recommended to START  2 weeks on followed by one week off of palbociclib for cycle #3.  Her CA 27.29 is stable. Restaging scan shows moderate control of visceral disease but few bone lesions progressing like left hip, L3 vertebra and right humerus. Delton See will help. I will see her back in 1 month with labs and repeat exam including her tumor marker ca27-29.Continue the current regimen of Femara/Palbociclib and Xgeva.  5. Stage III (T1N1M0) Laryngeal Carcinoma, probable.   --She has been seen by radiation oncology.  In the presence of Nurse Gerald Dexter, we reviewed her CT of neck as noted above and she agreed to further evaluation by Dr. Sondra Come.  She declined intervention presently.  She  is being followed by Dr. Sondra Come.   6. Leukopenia/Anemia of chronic disease plus/minus iron defiency. -- Past work up for reversible causes of anemia were negative.  --Her WBC are decreasing.  This can in part be explained by her palbociclib.  We will monitor her labs. Neupogen shot x 1 today.  7. Grade 2 dystolic dysfunction. --She is not fluid overloaded today on exam. She is on Lasix, nitrogylerin prn, losartan. Increase lasix prn and weight daily.   8. Diabetes mellitus, type II. Diet controlled.   9. COPD: On albuterol prn. She remains on home oxygen.  10. Chronic renal insufficiency. Using Xgeva because of safe renal profile.    11. Follow up: In about 1 month with CBC, CMP, CA 27 29. Next scan to be arranged in January or February 2016.   All questions were answered. The patient knows to call the clinic with any problems, questions or concerns. We can certainly see the patient much sooner if necessary.  I spent 25 minutes counseling the patient face to face. The total time spent in the appointment was 40 minutes.    Bernadene Bell, MD Medical Hematologist/Oncologist [place] Pager: [phone] Office  No: [phone]

## 2014-06-08 NOTE — Telephone Encounter (Signed)
Gave avs with apt for 07/15/14. Pt is unable to do 07/06/14. Chief Strategy Officer

## 2014-06-08 NOTE — Patient Instructions (Signed)
Filgrastim, G-CSF injection What is this medicine? FILGRASTIM, G-CSF (fil GRA stim) is a granulocyte colony-stimulating factor that stimulates the growth of neutrophils, a type of white blood cell (WBC) important in the body's fight against infection. It is used to reduce the incidence of fever and infection in patients with certain types of cancer who are receiving chemotherapy that affects the bone marrow, to stimulate blood cell production for removal of WBCs from the body prior to a bone marrow transplantation, to reduce the incidence of fever and infection in patients who have severe chronic neutropenia, and to improve survival outcomes following high-dose radiation exposure that is toxic to the bone marrow. This medicine may be used for other purposes; ask your health care provider or pharmacist if you have questions. COMMON BRAND NAME(S): Neupogen What should I tell my health care provider before I take this medicine? They need to know if you have any of these conditions: -latex allergy -ongoing radiation therapy -sickle cell disease -an unusual or allergic reaction to filgrastim, pegfilgrastim, other medicines, foods, dyes, or preservatives -pregnant or trying to get pregnant -breast-feeding How should I use this medicine? This medicine is for injection under the skin. If you get this medicine at home, you will be taught how to prepare and give this medicine. Refer to the Instructions for Use that come with your medication packaging. Use exactly as directed. Take your medicine at regular intervals. Do not take your medicine more often than directed. It is important that you put your used needles and syringes in a special sharps container. Do not put them in a trash can. If you do not have a sharps container, call your pharmacist or healthcare provider to get one. Talk to your pediatrician regarding the use of this medicine in children. While this drug may be prescribed for children as young  as 7 months for selected conditions, precautions do apply. Overdosage: If you think you have taken too much of this medicine contact a poison control center or emergency room at once. NOTE: This medicine is only for you. Do not share this medicine with others. What if I miss a dose? It is important not to miss your dose. Call your doctor or health care professional if you miss a dose. What may interact with this medicine? This medicine may interact with the following medications: -medicines that may cause a release of neutrophils, such as lithium This list may not describe all possible interactions. Give your health care provider a list of all the medicines, herbs, non-prescription drugs, or dietary supplements you use. Also tell them if you smoke, drink alcohol, or use illegal drugs. Some items may interact with your medicine. What should I watch for while using this medicine? You may need blood work done while you are taking this medicine. What side effects may I notice from receiving this medicine? Side effects that you should report to your doctor or health care professional as soon as possible: -allergic reactions like skin rash, itching or hives, swelling of the face, lips, or tongue -dizziness or feeling faint -fever -pain, redness, or irritation at site where injected -pinpoint red spots on the skin -shortness of breath or breathing problems -stomach or side pain, or pain at the shoulder -swelling -tiredness -trouble passing urine -unusual bleeding or bruising Side effects that usually do not require medical attention (report to your doctor or health care professional if they continue or are bothersome): -bone pain -headache -muscle pain This list may not describe all possible side  effects. Call your doctor for medical advice about side effects. You may report side effects to FDA at 1-800-FDA-1088. Where should I keep my medicine? Keep out of the reach of children. Store in a  refrigerator between 2 and 8 degrees C (36 and 46 degrees F). Do not freeze. Keep in carton to protect from light. Throw away this medicine if vials or syringes are left out of the refrigerator for more than 24 hours. Throw away any unused medicine after the expiration date. NOTE: This sheet is a summary. It may not cover all possible information. If you have questions about this medicine, talk to your doctor, pharmacist, or health care provider.  2015, Elsevier/Gold Standard. (2013-11-27 17:00:01) Denosumab injection What is this medicine? DENOSUMAB (den oh sue mab) slows bone breakdown. Prolia is used to treat osteoporosis in women after menopause and in men. Delton See is used to prevent bone fractures and other bone problems caused by cancer bone metastases. Delton See is also used to treat giant cell tumor of the bone. This medicine may be used for other purposes; ask your health care provider or pharmacist if you have questions. COMMON BRAND NAME(S): Prolia, XGEVA What should I tell my health care provider before I take this medicine? They need to know if you have any of these conditions: -dental disease -eczema -infection or history of infections -kidney disease or on dialysis -low blood calcium or vitamin D -malabsorption syndrome -scheduled to have surgery or tooth extraction -taking medicine that contains denosumab -thyroid or parathyroid disease -an unusual reaction to denosumab, other medicines, foods, dyes, or preservatives -pregnant or trying to get pregnant -breast-feeding How should I use this medicine? This medicine is for injection under the skin. It is given by a health care professional in a hospital or clinic setting. If you are getting Prolia, a special MedGuide will be given to you by the pharmacist with each prescription and refill. Be sure to read this information carefully each time. For Prolia, talk to your pediatrician regarding the use of this medicine in children.  Special care may be needed. For Delton See, talk to your pediatrician regarding the use of this medicine in children. While this drug may be prescribed for children as young as 13 years for selected conditions, precautions do apply. Overdosage: If you think you've taken too much of this medicine contact a poison control center or emergency room at once. Overdosage: If you think you have taken too much of this medicine contact a poison control center or emergency room at once. NOTE: This medicine is only for you. Do not share this medicine with others. What if I miss a dose? It is important not to miss your dose. Call your doctor or health care professional if you are unable to keep an appointment. What may interact with this medicine? Do not take this medicine with any of the following medications: -other medicines containing denosumab This medicine may also interact with the following medications: -medicines that suppress the immune system -medicines that treat cancer -steroid medicines like prednisone or cortisone This list may not describe all possible interactions. Give your health care provider a list of all the medicines, herbs, non-prescription drugs, or dietary supplements you use. Also tell them if you smoke, drink alcohol, or use illegal drugs. Some items may interact with your medicine. What should I watch for while using this medicine? Visit your doctor or health care professional for regular checks on your progress. Your doctor or health care professional may order blood tests  and other tests to see how you are doing. Call your doctor or health care professional if you get a cold or other infection while receiving this medicine. Do not treat yourself. This medicine may decrease your body's ability to fight infection. You should make sure you get enough calcium and vitamin D while you are taking this medicine, unless your doctor tells you not to. Discuss the foods you eat and the vitamins you  take with your health care professional. See your dentist regularly. Brush and floss your teeth as directed. Before you have any dental work done, tell your dentist you are receiving this medicine. Do not become pregnant while taking this medicine or for 5 months after stopping it. Women should inform their doctor if they wish to become pregnant or think they might be pregnant. There is a potential for serious side effects to an unborn child. Talk to your health care professional or pharmacist for more information. What side effects may I notice from receiving this medicine? Side effects that you should report to your doctor or health care professional as soon as possible: -allergic reactions like skin rash, itching or hives, swelling of the face, lips, or tongue -breathing problems -chest pain -fast, irregular heartbeat -feeling faint or lightheaded, falls -fever, chills, or any other sign of infection -muscle spasms, tightening, or twitches -numbness or tingling -skin blisters or bumps, or is dry, peels, or red -slow healing or unexplained pain in the mouth or jaw -unusual bleeding or bruising Side effects that usually do not require medical attention (Report these to your doctor or health care professional if they continue or are bothersome.): -muscle pain -stomach upset, gas This list may not describe all possible side effects. Call your doctor for medical advice about side effects. You may report side effects to FDA at 1-800-FDA-1088. Where should I keep my medicine? This medicine is only given in a clinic, doctor's office, or other health care setting and will not be stored at home. NOTE: This sheet is a summary. It may not cover all possible information. If you have questions about this medicine, talk to your doctor, pharmacist, or health care provider.  2015, Elsevier/Gold Standard. (2012-02-11 12:37:47)

## 2014-06-09 LAB — CANCER ANTIGEN 27.29: CA 27.29: 40 U/mL — AB (ref 0–39)

## 2014-06-10 ENCOUNTER — Telehealth: Payer: Self-pay | Admitting: Cardiology

## 2014-06-10 ENCOUNTER — Encounter: Payer: Self-pay | Admitting: *Deleted

## 2014-06-10 NOTE — Telephone Encounter (Signed)
Pt called in stating that she had an appointment at the cancer center where they had blood drawn. She just wanted to inform Dr. Percival Spanish of this so she will not have to do it again. She said they took 4 tubes of blood.  Thanks

## 2014-06-10 NOTE — Telephone Encounter (Signed)
Anita [name] called and wants to be admitted on the 23th of this month what type of bed do want me to reserve for her

## 2014-06-10 NOTE — Telephone Encounter (Signed)
Pt says she is going to the hospital next Friday(06-18-14).She says she want you to make the arrangements.

## 2014-06-10 NOTE — Telephone Encounter (Signed)
Pt. Called and i informed her I would alert our hospital staff and Dr. Percival Spanish

## 2014-06-14 NOTE — Telephone Encounter (Signed)
Waiting to hear from Dr. Percival Spanish about what kind of bed pt. needs

## 2014-06-16 ENCOUNTER — Other Ambulatory Visit: Payer: Self-pay | Admitting: Family Medicine

## 2014-06-16 ENCOUNTER — Telehealth: Payer: Self-pay | Admitting: Cardiology

## 2014-06-16 ENCOUNTER — Telehealth: Payer: Self-pay | Admitting: Family Medicine

## 2014-06-16 MED ORDER — ALBUTEROL SULFATE (2.5 MG/3ML) 0.083% IN NEBU: 2.5000 mg | INHALATION_SOLUTION | Freq: Four times a day (QID) | RESPIRATORY_TRACT | Status: DC | PRN

## 2014-06-16 NOTE — Telephone Encounter (Signed)
Pt requesting a nebulizer. Also, states that she will be admitted into the hospital this weekend per Dr. Percival Spanish, to remove fluid. Pls advise.

## 2014-06-16 NOTE — Telephone Encounter (Signed)
Rx for neb machine given to RN Tamika to fax to [facility] agency.

## 2014-06-16 NOTE — Telephone Encounter (Signed)
Two messages from answering service: Her doctor advised to go the hospital this weekend,please make arrangements for her.                                                                                                                           Pt going to the hospital this weekend and needs to have a breathing treatment ordered.

## 2014-06-16 NOTE — Telephone Encounter (Signed)
Left message for patient. Informed her that I see documentation where she spoke to [facility] about being admitted on 10/23 per Dr. Percival Spanish and where JC had requested information regarding which type of unit patient will need to be admitted to. Informed patient that she can call back to provide more information but that this message will be communicated to [facility] and Dr. Percival Spanish to review and advise on admission & orders?

## 2014-06-17 NOTE — Telephone Encounter (Signed)
Returning your call from yesterday. °

## 2014-06-17 NOTE — Telephone Encounter (Signed)
trish called and informed about tele bed for [employer]

## 2014-06-18 ENCOUNTER — Encounter (HOSPITAL_COMMUNITY): Payer: Self-pay

## 2014-06-18 ENCOUNTER — Inpatient Hospital Stay (HOSPITAL_COMMUNITY)
Admission: AD | Admit: 2014-06-18 | Discharge: 2014-06-28 | DRG: 291 | Disposition: A | Discharge status: Home / Self Care | Payer: Medicare HMO | Source: Ambulatory Visit | Attending: Cardiology | Admitting: Cardiology

## 2014-06-18 DIAGNOSIS — F0390 Unspecified dementia without behavioral disturbance: Secondary | ICD-10-CM | POA: Diagnosis present

## 2014-06-18 DIAGNOSIS — E119 Type 2 diabetes mellitus without complications: Secondary | ICD-10-CM | POA: Diagnosis present

## 2014-06-18 DIAGNOSIS — C50919 Malignant neoplasm of unspecified site of unspecified female breast: Secondary | ICD-10-CM | POA: Diagnosis present

## 2014-06-18 DIAGNOSIS — E876 Hypokalemia: Secondary | ICD-10-CM

## 2014-06-18 DIAGNOSIS — Z7982 Long term (current) use of aspirin: Secondary | ICD-10-CM

## 2014-06-18 DIAGNOSIS — N390 Urinary tract infection, site not specified: Secondary | ICD-10-CM | POA: Diagnosis present

## 2014-06-18 DIAGNOSIS — F329 Major depressive disorder, single episode, unspecified: Secondary | ICD-10-CM | POA: Diagnosis present

## 2014-06-18 DIAGNOSIS — D638 Anemia in other chronic diseases classified elsewhere: Secondary | ICD-10-CM | POA: Diagnosis present

## 2014-06-18 DIAGNOSIS — Z9981 Dependence on supplemental oxygen: Secondary | ICD-10-CM | POA: Diagnosis not present

## 2014-06-18 DIAGNOSIS — I252 Old myocardial infarction: Secondary | ICD-10-CM | POA: Diagnosis not present

## 2014-06-18 DIAGNOSIS — E1122 Type 2 diabetes mellitus with diabetic chronic kidney disease: Secondary | ICD-10-CM

## 2014-06-18 DIAGNOSIS — Z9119 Patient's noncompliance with other medical treatment and regimen: Secondary | ICD-10-CM | POA: Diagnosis present

## 2014-06-18 DIAGNOSIS — E785 Hyperlipidemia, unspecified: Secondary | ICD-10-CM | POA: Diagnosis present

## 2014-06-18 DIAGNOSIS — I5033 Acute on chronic diastolic (congestive) heart failure: Secondary | ICD-10-CM | POA: Diagnosis present

## 2014-06-18 DIAGNOSIS — N3000 Acute cystitis without hematuria: Secondary | ICD-10-CM

## 2014-06-18 DIAGNOSIS — N189 Chronic kidney disease, unspecified: Secondary | ICD-10-CM

## 2014-06-18 DIAGNOSIS — N183 Chronic kidney disease, stage 3 unspecified: Secondary | ICD-10-CM

## 2014-06-18 DIAGNOSIS — J449 Chronic obstructive pulmonary disease, unspecified: Secondary | ICD-10-CM | POA: Diagnosis present

## 2014-06-18 DIAGNOSIS — R0602 Shortness of breath: Secondary | ICD-10-CM

## 2014-06-18 DIAGNOSIS — Z6841 Body Mass Index (BMI) 40.0 and over, adult: Secondary | ICD-10-CM | POA: Diagnosis not present

## 2014-06-18 DIAGNOSIS — I5043 Acute on chronic combined systolic (congestive) and diastolic (congestive) heart failure: Secondary | ICD-10-CM | POA: Diagnosis present

## 2014-06-18 DIAGNOSIS — Z87891 Personal history of nicotine dependence: Secondary | ICD-10-CM | POA: Diagnosis not present

## 2014-06-18 DIAGNOSIS — I25119 Atherosclerotic heart disease of native coronary artery with unspecified angina pectoris: Secondary | ICD-10-CM | POA: Diagnosis present

## 2014-06-18 DIAGNOSIS — N179 Acute kidney failure, unspecified: Secondary | ICD-10-CM

## 2014-06-18 DIAGNOSIS — K5900 Constipation, unspecified: Secondary | ICD-10-CM | POA: Diagnosis present

## 2014-06-18 DIAGNOSIS — J9620 Acute and chronic respiratory failure, unspecified whether with hypoxia or hypercapnia: Secondary | ICD-10-CM

## 2014-06-18 DIAGNOSIS — C7951 Secondary malignant neoplasm of bone: Secondary | ICD-10-CM | POA: Diagnosis present

## 2014-06-18 DIAGNOSIS — N184 Chronic kidney disease, stage 4 (severe): Secondary | ICD-10-CM | POA: Diagnosis present

## 2014-06-18 LAB — CBC WITH DIFFERENTIAL/PLATELET
BASOS ABS: 0 10*3/uL (ref 0.0–0.1)
BASOS PCT: 1 % (ref 0–1)
EOS PCT: 1 % (ref 0–5)
Eosinophils Absolute: 0.1 10*3/uL (ref 0.0–0.7)
HEMATOCRIT: 27 % — AB (ref 36.0–46.0)
Hemoglobin: 8.5 g/dL — ABNORMAL LOW (ref 12.0–15.0)
Lymphocytes Relative: 9 % — ABNORMAL LOW (ref 12–46)
Lymphs Abs: 0.4 10*3/uL — ABNORMAL LOW (ref 0.7–4.0)
MCH: 29.6 pg (ref 26.0–34.0)
MCHC: 31.5 g/dL (ref 30.0–36.0)
MCV: 94.1 fL (ref 78.0–100.0)
MONO ABS: 0.2 10*3/uL (ref 0.1–1.0)
MONOS PCT: 5 % (ref 3–12)
Neutro Abs: 3.8 10*3/uL (ref 1.7–7.7)
Neutrophils Relative %: 84 % — ABNORMAL HIGH (ref 43–77)
Platelets: 250 10*3/uL (ref 150–400)
RBC: 2.87 MIL/uL — ABNORMAL LOW (ref 3.87–5.11)
RDW: 18.5 % — ABNORMAL HIGH (ref 11.5–15.5)
WBC: 4.5 10*3/uL (ref 4.0–10.5)

## 2014-06-18 LAB — COMPREHENSIVE METABOLIC PANEL
ALT: 24 U/L (ref 0–35)
ANION GAP: 12 (ref 5–15)
AST: 22 U/L (ref 0–37)
Albumin: 3.4 g/dL — ABNORMAL LOW (ref 3.5–5.2)
Alkaline Phosphatase: 105 U/L (ref 39–117)
BUN: 38 mg/dL — AB (ref 6–23)
CALCIUM: 8.4 mg/dL (ref 8.4–10.5)
CHLORIDE: 105 meq/L (ref 96–112)
CO2: 25 mEq/L (ref 19–32)
CREATININE: 2.23 mg/dL — AB (ref 0.50–1.10)
GFR calc Af Amer: 25 mL/min — ABNORMAL LOW (ref >90)
GFR calc non Af Amer: 22 mL/min — ABNORMAL LOW (ref >90)
Glucose, Bld: 128 mg/dL — ABNORMAL HIGH (ref 70–99)
Potassium: 4.3 mEq/L (ref 3.7–5.3)
Sodium: 142 mEq/L (ref 137–147)
Total Protein: 6.9 g/dL (ref 6.0–8.3)

## 2014-06-18 LAB — GLUCOSE, CAPILLARY: GLUCOSE-CAPILLARY: 151 mg/dL — AB (ref 70–99)

## 2014-06-18 LAB — PROTIME-INR
INR: 1.04 (ref 0.00–1.49)
PROTHROMBIN TIME: 13.7 s (ref 11.6–15.2)

## 2014-06-18 LAB — TSH: TSH: 0.006 u[IU]/mL — ABNORMAL LOW (ref 0.350–4.500)

## 2014-06-18 LAB — PRO B NATRIURETIC PEPTIDE: Pro B Natriuretic peptide (BNP): 612.6 pg/mL — ABNORMAL HIGH (ref 0–125)

## 2014-06-18 MED ORDER — DEXTROSE 5 % IV SOLN
20.0000 mg/h | INTRAVENOUS | Status: DC
  Administered 2014-06-18 – 2014-06-19 (×3): 10 mg/h via INTRAVENOUS
  Administered 2014-06-21 – 2014-06-24 (×3): 12 mg/h via INTRAVENOUS
  Administered 2014-06-24 – 2014-06-26 (×3): 20 mg/h via INTRAVENOUS
  Filled 2014-06-18 (×20): qty 25

## 2014-06-18 MED ORDER — ATORVASTATIN CALCIUM 40 MG PO TABS
40.0000 mg | ORAL_TABLET | Freq: Every day | ORAL | Status: DC
  Administered 2014-06-18 – 2014-06-28 (×11): 40 mg via ORAL
  Filled 2014-06-18 (×11): qty 1

## 2014-06-18 MED ORDER — ISOSORBIDE MONONITRATE ER 60 MG PO TB24
60.0000 mg | ORAL_TABLET | Freq: Every day | ORAL | Status: DC
  Administered 2014-06-19 – 2014-06-28 (×10): 60 mg via ORAL
  Filled 2014-06-18 (×10): qty 1

## 2014-06-18 MED ORDER — LETROZOLE 2.5 MG PO TABS
2.5000 mg | ORAL_TABLET | Freq: Every day | ORAL | Status: DC
  Administered 2014-06-19 – 2014-06-28 (×10): 2.5 mg via ORAL
  Filled 2014-06-18 (×10): qty 1

## 2014-06-18 MED ORDER — CLOTRIMAZOLE 1 % EX CREA
1.0000 "application " | TOPICAL_CREAM | Freq: Every day | CUTANEOUS | Status: DC
  Administered 2014-06-19 – 2014-06-27 (×6): 1 via TOPICAL
  Filled 2014-06-18: qty 15

## 2014-06-18 MED ORDER — MIRTAZAPINE 15 MG PO TABS
15.0000 mg | ORAL_TABLET | Freq: Every day | ORAL | Status: DC
  Administered 2014-06-18 – 2014-06-27 (×10): 15 mg via ORAL
  Filled 2014-06-18 (×11): qty 1

## 2014-06-18 MED ORDER — PNEUMOCOCCAL VAC POLYVALENT 25 MCG/0.5ML IJ INJ
0.5000 mL | INJECTION | INTRAMUSCULAR | Status: DC
  Filled 2014-06-18: qty 0.5

## 2014-06-18 MED ORDER — POTASSIUM CHLORIDE CRYS ER 20 MEQ PO TBCR
40.0000 meq | EXTENDED_RELEASE_TABLET | Freq: Two times a day (BID) | ORAL | Status: DC
  Administered 2014-06-18 – 2014-06-22 (×8): 40 meq via ORAL
  Filled 2014-06-18 (×9): qty 2

## 2014-06-18 MED ORDER — ALBUTEROL SULFATE (2.5 MG/3ML) 0.083% IN NEBU: 3.0000 mL | INHALATION_SOLUTION | Freq: Two times a day (BID) | RESPIRATORY_TRACT | Status: DC | PRN

## 2014-06-18 MED ORDER — POTASSIUM CHLORIDE CRYS ER 20 MEQ PO TBCR: 20.0000 meq | EXTENDED_RELEASE_TABLET | Freq: Two times a day (BID) | ORAL | Status: DC

## 2014-06-18 MED ORDER — INFLUENZA VAC SPLIT QUAD 0.5 ML IM SUSY
0.5000 mL | PREFILLED_SYRINGE | INTRAMUSCULAR | Status: DC
  Filled 2014-06-18: qty 0.5

## 2014-06-18 MED ORDER — ASPIRIN EC 81 MG PO TBEC
81.0000 mg | DELAYED_RELEASE_TABLET | Freq: Every day | ORAL | Status: DC
  Administered 2014-06-19 – 2014-06-28 (×10): 81 mg via ORAL
  Filled 2014-06-18 (×10): qty 1

## 2014-06-18 MED ORDER — PALBOCICLIB 125 MG PO CAPS
125.0000 mg | ORAL_CAPSULE | Freq: Every day | ORAL | Status: DC
  Administered 2014-06-19 – 2014-06-28 (×6): 125 mg via ORAL
  Filled 2014-06-18: qty 1

## 2014-06-18 MED ORDER — DOCUSATE SODIUM 100 MG PO CAPS
100.0000 mg | ORAL_CAPSULE | Freq: Two times a day (BID) | ORAL | Status: DC
  Administered 2014-06-18 – 2014-06-28 (×20): 100 mg via ORAL
  Filled 2014-06-18 (×21): qty 1

## 2014-06-18 MED ORDER — TIOTROPIUM BROMIDE MONOHYDRATE 18 MCG IN CAPS
18.0000 ug | ORAL_CAPSULE | Freq: Every day | RESPIRATORY_TRACT | Status: DC
  Administered 2014-06-19 – 2014-06-27 (×7): 18 ug via RESPIRATORY_TRACT
  Filled 2014-06-18 (×4): qty 5

## 2014-06-18 NOTE — H&P (Signed)
History and Physical   Patient ID: Anita [name] MRN: [id number], DOB/AGE: 1945/09/02 68 y.o. Date of Encounter: 06/18/2014  Primary Physician: [name], DO Primary Cardiologist: Dr Percival Spanish  Chief Complaint:  CHF  HPI: Anita [name] is a 68 y.o. female with a history of CAD and CHF. She has noticed a weight gain recently and feels that she may be at as much as 15-20 pounds. She is on oxygen chronically, but has noticed an increase in her dyspnea on exertion. Even on oxygen, she cannot walk across a room without stopping for breath. She has not had chest pain. She admits to lower extremity edema, orthopnea and possibly PND. Attempts have been made to manage her medically as an outpatient. However, they have not been successful. Finally, she agreed to come in for IV diuresis. An admission was scheduled for today and she is here for admission.  She is significantly short of breath at rest. She has significant orthopnea. She has lower extremity edema, but the extent is difficult to adjust to this she has Unna boots in place on both lower extremities. She has several chronic medical issues but no new acute medical issues.   Past Medical History  Diagnosis Date  . Fibroid   . Morbid obesity   . 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 COPD (chronic obstructive pulmonary disease)   . Chronic diastolic CHF (congestive heart failure)   . NSTEMI (non-ST elevated myocardial infarction)     Cath November 2014 LAD 40% stenosis, first diagonal 80% stenosis, circumflex 20% stenosis, right coronary artery occluded. The EF was 35-40% at that time.  . Anemia     a. Adm 09/2012 with melena, Hgb 5.8 -> transfused. EGD/colonoscopy unrevealing.  . Breast cancer     a. Mets to bone. ER 100%/ PR 0%/Her2 neu negative.  [name] Ulcers of both lower legs   . Tobacco abuse   . Hyperlipidemia 02/11/2013  . Chronic respiratory failure     a. On O2 qhs. also portable O2.    . Chronic renal insufficiency   . Lesion of vocal cord     a. CT 05/2013 concerning for tumor.  . Shortness of breath   . Depression   . Diabetes mellitus without complication   . Anginal pain   . Dementia     very mild  . Radiation 02/02/14-02/24/14    left and right femur 30 gray    Surgical History:  Past Surgical History  Procedure Laterality Date  . Colonoscopy, esophagogastroduodenoscopy (egd) and esophageal dilation N/A 10/13/2012    Procedure: colonoscopy and egd;  Surgeon: Wonda Horner, MD;  Location: [facility];  Service: Endoscopy;  Laterality: N/A;     I have reviewed the patient's current medications. Medication Sig  albuterol (PROVENTIL HFA;VENTOLIN HFA) 108 (90 BASE) MCG/ACT inhaler Inhale 2 puffs into the lungs 2 (two) times daily as needed for wheezing or shortness of breath.   albuterol (PROVENTIL) (2.5 MG/3ML) 0.083% nebulizer solution Take 3 mLs (2.5 mg total) by nebulization every 6 (six) hours as needed for wheezing or shortness of breath.  aspirin EC 81 MG EC tablet Take 1 tablet (81 mg total) by mouth daily.  atorvastatin (LIPITOR) 40 MG tablet TAKE 1 TABLET EVERY DAY AT BEDTIME  clotrimazole (LOTRIMIN) 1 % cream Apply 1 application topically daily.  HYDROcodone-acetaminophen (NORCO/VICODIN) 5-325 MG per tablet Take 2 tablets by mouth every 4 (four)  hours as needed for moderate pain.  isosorbide mononitrate (IMDUR) 60 MG 24 hr tablet Take 1 tablet (60 mg total) by mouth daily.  letrozole (FEMARA) 2.5 MG tablet Take 1 tablet (2.5 mg total) by mouth daily.  mirtazapine (REMERON) 15 MG tablet Take 1 tablet (15 mg total) by mouth at bedtime.  naproxen sodium (ANAPROX) 220 MG tablet Take 220 mg by mouth daily as needed (for pain).  nitroGLYCERIN (NITROSTAT) 0.4 MG SL tablet Place 1 tablet (0.4 mg total) under the tongue every 5 (five) minutes as needed for chest pain.  NON FORMULARY Place 3 L into the nose as needed (oxygen).   palbociclib (IBRANCE) 125 MG capsule  Take 125 mg by mouth See admin instructions. Take 151m every morning for 2 weeks, hold for a week, then repeat. Take whole with food.  potassium chloride SA (K-DUR,KLOR-CON) 20 MEQ tablet Take 2 tablets (40 mEq total) by mouth 2 (two) times daily.  tiotropium (SPIRIVA) 18 MCG inhalation capsule Place 1 capsule (18 mcg total) into inhaler and inhale daily.  torsemide (DEMADEX) 20 MG tablet Take 2 tablets (40 mg total) by mouth 2 (two) times daily.  VITAMIN D, CHOLECALCIFEROL, PO Take 1,000 mg by mouth daily.    Allergies:  Allergies  Allergen Reactions  . Omnipaque [Iohexol]     Pt claims she developed hives after given contrast  . Benzene Rash    History   Social History  . Marital Status: Widowed    Spouse Name: N/A    Number of Children: 2  . Years of Education: N/A   Occupational History  . retired -Energy manager   Social History Main Topics  . Smoking status: Former Smoker -- 2.00 packs/day for 40 years    Types: Cigarettes    Quit date: 09/24/2012  . Smokeless tobacco: Never Used  . Alcohol Use: No  . Drug Use: No  . Sexual Activity: No   Other Topics Concern  . Not on file   Social History Narrative   Lives with husband HKandyce [phone] Daughter who would like to make medical decisions is [name]([phone] Patient in school at [employer] counseling degree.     Family History  Problem Relation Age of Onset  . Cancer Mother     leukemia  . Hypertension Mother   . Diabetes Father   . Heart disease Father     passed away due to heart attack   Family Status  Relation Status Death Age  . Mother Deceased   . Father Deceased     Review of Systems:   Full 14-point review of systems otherwise negative except as noted above.  Physical Exam: Blood pressure 121/60, pulse 98, temperature 99 F (37.2 C), temperature source Oral, resp. rate 20, height _0  (1.575 m), weight 250 lb 7.1 oz (113.6 kg), SpO2 100.00%. General: Well developed, well  nourished,female in moderate respiratory distress. Head: Normocephalic, atraumatic, sclera non-icteric, no xanthomas, nares are without discharge. Dentition: Poor Neck: No carotid bruits. JVD 10-12 cm. No thyromegally Lungs: Good expansion bilaterally. without wheezes or rhonchi. Bilateral rales  Heart: Regular rate and rhythm with S1 S2.  No S3 or S4.  2/6 murmur, no rubs, or gallops appreciated. Abdomen: Soft, non-tender, non-distended with normoactive bowel sounds. Mild hepatomegaly by percussion. No rebound/guarding. No obvious abdominal masses. Msk:  Strength and tone appear normal for age. No joint deformities or effusions, no spine or costo-vertebral angle tenderness. Extremities: No clubbing or cyanosis. Possible  edema.  Distal pedal pulses are 2+ in 4 extrem Neuro: Alert and oriented X 3. Moves all extremities spontaneously. No focal deficits noted. Psych:  Responds to questions appropriately with a normal affect. Skin: No rashes or lesions noted  Labs:   Lab Results  Component Value Date   WBC 2.6* 06/08/2014   HGB 8.1* 06/08/2014   HCT 25.0* 06/08/2014   MCV 93.1 06/08/2014   PLT 230 06/08/2014    Radiology/Studies: pending  Echo: 02/04/2014 - Left ventricle: The cavity size was normal. Systolic function was normal. The estimated ejection fraction was 55%. Images were inadequate for LV wall motion assessment. - Ventricular septum: Septal motion showed paradox. These changes are consistent with intraventricular conduction delay. - Pulmonic valve: There was trivial regurgitation.  ECG: pending   ASSESSMENT AND PLAN:  Principal Problem:   Acute on chronic respiratory failure - admit, IV Lasix drip for diuresis. Follow renal function, intake/output and daily weights carefully. Continue home medications for other issues including cancer medications. Add sliding scale insulin. Follow up on laboratory results.  Otherwise, continue home medications. Active Problems:   DM2  (diabetes mellitus, type 2)   COPD (chronic obstructive pulmonary disease)   Chronic kidney disease, stage III (moderate)   Anemia of chronic disease   Breast cancer metastasized to bone   Acute on chronic combined systolic and diastolic congestive heart failure, NYHA class 4   Signed, Rosaria Ferries, PA-C 06/18/2014 6:59 PM Beeper [phone]    History and all data above reviewed.  Patient examined.  The patient is well known to me.  She has has had progressive weight gain despite increasing diuretic.  She has had some increased dyspnea but her symptoms for the most part have been right sided.  She has renal insufficiency.  I agree with the findings as above.  The patient exam reveals COR:RRR  ,  Lungs: Clear  ,  Abd: Positive bowel sounds, no rebound no guarding, Ext Severe diffuse edema  .  All available labs, radiology testing, previous records reviewed. Agree with documented assessment and plan. The patient is admitted for IV diuresis.  Very important to this will be keeping her feet elevated.   Jeneen Rinks Marlena Barbato  2:27 AM  06/19/2014

## 2014-06-19 ENCOUNTER — Inpatient Hospital Stay (HOSPITAL_COMMUNITY): Payer: Medicare HMO

## 2014-06-19 DIAGNOSIS — I5033 Acute on chronic diastolic (congestive) heart failure: Secondary | ICD-10-CM

## 2014-06-19 LAB — BASIC METABOLIC PANEL
Anion gap: 10 (ref 5–15)
BUN: 40 mg/dL — ABNORMAL HIGH (ref 6–23)
CO2: 28 mEq/L (ref 19–32)
Calcium: 8.2 mg/dL — ABNORMAL LOW (ref 8.4–10.5)
Chloride: 103 mEq/L (ref 96–112)
Creatinine, Ser: 2.12 mg/dL — ABNORMAL HIGH (ref 0.50–1.10)
GFR calc Af Amer: 27 mL/min — ABNORMAL LOW (ref >90)
GFR calc non Af Amer: 23 mL/min — ABNORMAL LOW (ref >90)
Glucose, Bld: 155 mg/dL — ABNORMAL HIGH (ref 70–99)
Potassium: 4.1 mEq/L (ref 3.7–5.3)
Sodium: 141 mEq/L (ref 137–147)

## 2014-06-19 LAB — T4, FREE: Free T4: 1.59 ng/dL (ref 0.80–1.80)

## 2014-06-19 LAB — GLUCOSE, CAPILLARY: Glucose-Capillary: 276 mg/dL — ABNORMAL HIGH (ref 70–99)

## 2014-06-19 LAB — T3: T3 TOTAL: 150.2 ng/dL (ref 80.0–204.0)

## 2014-06-19 NOTE — Progress Notes (Signed)
Patient ID: Anita [name], female   DOB: May 22, 1946, 68 y.o.   MRN: [id number]     Subjective:    Not much change in SOB or edema.   Objective:   Temp:  [97.7 F (36.5 C)-99 F (37.2 C)] 97.7 F (36.5 C) (10/24 0500) Pulse Rate:  [82-98] 82 (10/24 0500) Resp:  [18-20] 18 (10/24 0500) BP: (117-131)/(51-70) 131/70 mmHg (10/24 0500) SpO2:  [96 %-100 %] 96 % (10/24 0825) FiO2 (%):  [32 %] 32 % (10/24 0825) Weight:  [250 lb 1.6 oz (113.445 kg)-250 lb 7.1 oz (113.6 kg)] 250 lb 1.6 oz (113.445 kg) (10/24 0500)    Filed Weights   06/18/14 1845 06/19/14 0500  Weight: 250 lb 7.1 oz (113.6 kg) 250 lb 1.6 oz (113.445 kg)    Intake/Output Summary (Last 24 hours) at 06/19/14 1235 Last data filed at 06/19/14 0925  Gross per 24 hour  Intake   1300 ml  Output      0 ml  Net   1300 ml    Telemetry: NSR  Exam:  General: NAD  Resp: faint crackles in bases  Cardiac: RRR, no m/r/g, no JVD  GI: abd soft, NT, ND  MSK: 2+ bilateral LE edema  Neuro: no focal deficits  Psych: appropriate affect  Lab Results:  Basic Metabolic Panel:  Recent Labs Lab 06/18/14 1921 06/19/14 0322  NA 142 141  K 4.3 4.1  CL 105 103  CO2 25 28  GLUCOSE 128* 155*  BUN 38* 40*  CREATININE 2.23* 2.12*  CALCIUM 8.4 8.2*    Liver Function Tests:  Recent Labs Lab 06/18/14 1921  AST 22  ALT 24  ALKPHOS 105  BILITOT <0.2*  PROT 6.9  ALBUMIN 3.4*    CBC:  Recent Labs Lab 06/18/14 1921  WBC 4.5  HGB 8.5*  HCT 27.0*  MCV 94.1  PLT 250    Cardiac Enzymes: No results found for this basename: CKTOTAL, CKMB, CKMBINDEX, TROPONINI,  in the last 168 hours  BNP:  Recent Labs  04/01/14 2214 04/04/14 0411 06/18/14 1921  PROBNP 588.7* 328.1* 612.6*    Coagulation:  Recent Labs Lab 06/18/14 1921  INR 1.04    ECG:   Medications:   Scheduled Medications: . aspirin EC  81 mg Oral Daily  . atorvastatin  40 mg Oral Daily  . clotrimazole  1 application Topical Daily  .  docusate sodium  100 mg Oral BID  . Influenza vac split quadrivalent PF  0.5 mL Intramuscular Tomorrow-1000  . isosorbide mononitrate  60 mg Oral Daily  . letrozole  2.5 mg Oral Daily  . mirtazapine  15 mg Oral QHS  . palbociclib  125 mg Oral Daily  . pneumococcal 23 valent vaccine  0.5 mL Intramuscular Tomorrow-1000  . potassium chloride SA  40 mEq Oral BID  . tiotropium  18 mcg Inhalation Daily     Infusions: . furosemide (LASIX) infusion 10 mg/hr (06/18/14 1950)     PRN Medications:  albuterol     Assessment/Plan    68 yo female hx of CAD, chronic diastolic HF, COPD on home O2, metastatic breast CA admitted with acute on chronic diastolic HF   1. Acute on chronic diastolic HF - reports 78-29 lbs weight gain, increased DOE, + LE edema and orthopna. No chest pain.  - 01/2014 echo LVEF 56%, diastolic function not described. Previous echo 02/2013 describes grade I diastolic dysfunction. Normal RV, normal PASP - UOP not recorded, will order strict I/Os and  daily weights. She is on lasix gtt at 10mg /hr, renal function remains stable. Difficult to assess progress without her documented output, by exam still significantly volume overloaded. Continue current lasix gtt.  - Low TSH, will check free T4 and T3.  - obtain CXR    Carlyle Dolly, M.D.

## 2014-06-20 DIAGNOSIS — I5033 Acute on chronic diastolic (congestive) heart failure: Principal | ICD-10-CM

## 2014-06-20 LAB — BASIC METABOLIC PANEL
Anion gap: 12 (ref 5–15)
BUN: 41 mg/dL — ABNORMAL HIGH (ref 6–23)
CHLORIDE: 100 meq/L (ref 96–112)
CO2: 28 meq/L (ref 19–32)
CREATININE: 1.94 mg/dL — AB (ref 0.50–1.10)
Calcium: 8.7 mg/dL (ref 8.4–10.5)
GFR calc Af Amer: 30 mL/min — ABNORMAL LOW (ref >90)
GFR calc non Af Amer: 26 mL/min — ABNORMAL LOW (ref >90)
Glucose, Bld: 142 mg/dL — ABNORMAL HIGH (ref 70–99)
Potassium: 4.1 mEq/L (ref 3.7–5.3)
Sodium: 140 mEq/L (ref 137–147)

## 2014-06-20 LAB — GLUCOSE, CAPILLARY: GLUCOSE-CAPILLARY: 167 mg/dL — AB (ref 70–99)

## 2014-06-20 MED ORDER — METOLAZONE 5 MG PO TABS
5.0000 mg | ORAL_TABLET | Freq: Once | ORAL | Status: AC
  Administered 2014-06-20: 5 mg via ORAL
  Filled 2014-06-20: qty 1

## 2014-06-20 MED ORDER — HEPARIN SODIUM (PORCINE) 5000 UNIT/ML IJ SOLN
5000.0000 [IU] | Freq: Three times a day (TID) | INTRAMUSCULAR | Status: DC
  Administered 2014-06-20 – 2014-06-28 (×13): 5000 [IU] via SUBCUTANEOUS
  Filled 2014-06-20 (×25): qty 1

## 2014-06-20 NOTE — Progress Notes (Signed)
Patient ID: OCIA SIMEK, female   DOB: 10-20-1945, 68 y.o.   MRN: [id number]     Subjective:    Still with SOB, LE edema    Objective:   Temp:  [98.2 F (36.8 C)-98.4 F (36.9 C)] 98.2 F (36.8 C) (10/25 0457) Pulse Rate:  [72-87] 72 (10/25 0457) Resp:  [18-20] 18 (10/25 0457) BP: (114-135)/(50-60) 114/52 mmHg (10/25 0457) SpO2:  [95 %-99 %] 99 % (10/25 0817) FiO2 (%):  [28 %] 28 % (10/25 0817) Weight:  [252 lb (114.306 kg)] 252 lb (114.306 kg) (10/25 0457)    Filed Weights   06/18/14 1845 06/19/14 0500 06/20/14 0457  Weight: 250 lb 7.1 oz (113.6 kg) 250 lb 1.6 oz (113.445 kg) 252 lb (114.306 kg)    Intake/Output Summary (Last 24 hours) at 06/20/14 1207 Last data filed at 06/20/14 0930  Gross per 24 hour  Intake   2320 ml  Output      0 ml  Net   2320 ml    Exam:  General: NAD  Resp:crackles bilateral bases  Cardiac: RRR, no m/r/g, no JVD  GI: abdomen soft, NT, ND  MSK: 2+ bilateral LE edema  Neuro: no focal deficits  Psych: appropriate affect  Lab Results:  Basic Metabolic Panel:  Recent Labs Lab 06/18/14 1921 06/19/14 0322 06/20/14 0318  NA 142 141 140  K 4.3 4.1 4.1  CL 105 103 100  CO2 25 28 28   GLUCOSE 128* 155* 142*  BUN 38* 40* 41*  CREATININE 2.23* 2.12* 1.94*  CALCIUM 8.4 8.2* 8.7    Liver Function Tests:  Recent Labs Lab 06/18/14 1921  AST 22  ALT 24  ALKPHOS 105  BILITOT <0.2*  PROT 6.9  ALBUMIN 3.4*    CBC:  Recent Labs Lab 06/18/14 1921  WBC 4.5  HGB 8.5*  HCT 27.0*  MCV 94.1  PLT 250    Cardiac Enzymes: No results found for this basename: CKTOTAL, CKMB, CKMBINDEX, TROPONINI,  in the last 168 hours  BNP:  Recent Labs  04/01/14 2214 04/04/14 0411 06/18/14 1921  PROBNP 588.7* 328.1* 612.6*    Coagulation:  Recent Labs Lab 06/18/14 1921  INR 1.04    ECG:   Medications:   Scheduled Medications: . aspirin EC  81 mg Oral Daily  . atorvastatin  40 mg Oral Daily  . clotrimazole  1  application Topical Daily  . docusate sodium  100 mg Oral BID  . heparin subcutaneous  5,000 Units Subcutaneous 3 times per day  . Influenza vac split quadrivalent PF  0.5 mL Intramuscular Tomorrow-1000  . isosorbide mononitrate  60 mg Oral Daily  . letrozole  2.5 mg Oral Daily  . mirtazapine  15 mg Oral QHS  . palbociclib  125 mg Oral Daily  . pneumococcal 23 valent vaccine  0.5 mL Intramuscular Tomorrow-1000  . potassium chloride SA  40 mEq Oral BID  . tiotropium  18 mcg Inhalation Daily     Infusions: . furosemide (LASIX) infusion 10 mg/hr (06/19/14 2238)     PRN Medications:  albuterol     Assessment/Plan    1. Acute on chronic diastolic HF  - reports 38-25 lbs weight gain, increased DOE, + LE edema and orthopna. No chest pain.  - 01/2014 echo LVEF 05%, diastolic function not described. Previous echo 02/2013 describes grade I diastolic dysfunction. Normal RV, normal PASP  - not much diuresis on lasix at 10, will increase to 12 and give dose of metolazone today.    -  Low TSH, normal free T4 and T3. Does not appear to be contributing to her edema   2. Stasis leg wounds - wound care consult         Carlyle Dolly, M.D.

## 2014-06-21 DIAGNOSIS — E1122 Type 2 diabetes mellitus with diabetic chronic kidney disease: Secondary | ICD-10-CM

## 2014-06-21 DIAGNOSIS — E785 Hyperlipidemia, unspecified: Secondary | ICD-10-CM

## 2014-06-21 DIAGNOSIS — I5033 Acute on chronic diastolic (congestive) heart failure: Secondary | ICD-10-CM | POA: Diagnosis present

## 2014-06-21 DIAGNOSIS — N189 Chronic kidney disease, unspecified: Secondary | ICD-10-CM

## 2014-06-21 DIAGNOSIS — J449 Chronic obstructive pulmonary disease, unspecified: Secondary | ICD-10-CM

## 2014-06-21 DIAGNOSIS — N183 Chronic kidney disease, stage 3 (moderate): Secondary | ICD-10-CM

## 2014-06-21 LAB — BASIC METABOLIC PANEL
ANION GAP: 17 — AB (ref 5–15)
BUN: 41 mg/dL — ABNORMAL HIGH (ref 6–23)
CO2: 26 mEq/L (ref 19–32)
Calcium: 8.7 mg/dL (ref 8.4–10.5)
Chloride: 95 mEq/L — ABNORMAL LOW (ref 96–112)
Creatinine, Ser: 1.88 mg/dL — ABNORMAL HIGH (ref 0.50–1.10)
GFR calc non Af Amer: 27 mL/min — ABNORMAL LOW (ref >90)
GFR, EST AFRICAN AMERICAN: 31 mL/min — AB (ref >90)
Glucose, Bld: 180 mg/dL — ABNORMAL HIGH (ref 70–99)
POTASSIUM: 4.1 meq/L (ref 3.7–5.3)
Sodium: 138 mEq/L (ref 137–147)

## 2014-06-21 NOTE — Telephone Encounter (Signed)
Pt in nurse office on 06/18/2014 to pick up nebulizer machine.  Forms faxed to [employer].  Derl Barrow, RN

## 2014-06-21 NOTE — Consult Note (Addendum)
WOC wound consult note Reason for Consult: Consult requested for bilat leg wraps.  Pt states she wears compression wraps to decrease edema and previously had open wounds and drainage to these sites. They are changed 3 times a week when at home. Wound type: Currently, there are no open wounds when the wraps were removed. Wound bed: Dry, peeling and flaking skin Drainage (amount, consistency, odor) No odor or drainage Dressing procedure/placement/frequency: Applied compression as previous plan of care with kerlex and coban from behind toes to below knees. Plan to change on Thurs if still in the hospital at that time.  Pt can resume previous plan of care after discharge. Please re-consult if further assistance is needed.  Thank-you,  Julien Girt MSN, [place], [place], [place], [place]

## 2014-06-21 NOTE — Progress Notes (Signed)
Patient Name: Anita [name] Date of Encounter: 06/21/2014  Primary Cardiologist: Dr Percival Spanish   Principal Problem:   Acute on chronic respiratory failure Active Problems:   DM2 (diabetes mellitus, type 2)   COPD (chronic obstructive pulmonary disease)   Chronic kidney disease, stage III (moderate)   Anemia of chronic disease   Breast cancer metastasized to bone   Acute on chronic combined systolic and diastolic congestive heart failure, NYHA class 4   Acute on chronic diastolic heart failure    SUBJECTIVE  Denies any SOB or CP.   CURRENT MEDS . aspirin EC  81 mg Oral Daily  . atorvastatin  40 mg Oral Daily  . clotrimazole  1 application Topical Daily  . docusate sodium  100 mg Oral BID  . heparin subcutaneous  5,000 Units Subcutaneous 3 times per day  . Influenza vac split quadrivalent PF  0.5 mL Intramuscular Tomorrow-1000  . isosorbide mononitrate  60 mg Oral Daily  . letrozole  2.5 mg Oral Daily  . mirtazapine  15 mg Oral QHS  . palbociclib  125 mg Oral Daily  . pneumococcal 23 valent vaccine  0.5 mL Intramuscular Tomorrow-1000  . potassium chloride SA  40 mEq Oral BID  . tiotropium  18 mcg Inhalation Daily    OBJECTIVE  Filed Vitals:   06/20/14 1418 06/20/14 2257 06/21/14 0547 06/21/14 0811  BP: 98/44 130/61 121/55   Pulse: 80 79 82   Temp: 97.7 F (36.5 C) 97.5 F (36.4 C) 98.3 F (36.8 C)   TempSrc: Oral Oral Oral   Resp: 20 20 21    Height:      Weight:   248 lb 0.3 oz (112.5 kg)   SpO2: 100% 100% 96% 95%    Intake/Output Summary (Last 24 hours) at 06/21/14 0831 Last data filed at 06/21/14 0825  Gross per 24 hour  Intake   1460 ml  Output   6351 ml  Net  -4891 ml   Filed Weights   06/19/14 0500 06/20/14 0457 06/21/14 0547  Weight: 250 lb 1.6 oz (113.445 kg) 252 lb (114.306 kg) 248 lb 0.3 oz (112.5 kg)    PHYSICAL EXAM  General: Pleasant, NAD. Neuro: Alert and oriented X 3. Moves all extremities spontaneously. Psych: Normal affect. HEENT:   Normal  Neck: Supple without bruits or JVD. Lungs:  Resp regular and unlabored, mild intermittent rale near bilateral basis.  Heart: RRR no s3, s4, or murmurs. Abdomen: Soft, non-tender, non-distended, BS + x 4.  Extremities: No clubbing, cyanosis or edema. DP/PT/Radials 2+ and equal bilaterally.  Accessory Clinical Findings  CBC  Recent Labs  06/18/14 1921  WBC 4.5  NEUTROABS 3.8  HGB 8.5*  HCT 27.0*  MCV 94.1  PLT 154   Basic Metabolic Panel  Recent Labs  06/20/14 0318 06/21/14 0418  NA 140 138  K 4.1 4.1  CL 100 95*  CO2 28 26  GLUCOSE 142* 180*  BUN 41* 41*  CREATININE 1.94* 1.88*  CALCIUM 8.7 8.7   Liver Function Tests  Recent Labs  06/18/14 1921  AST 22  ALT 24  ALKPHOS 105  BILITOT <0.2*  PROT 6.9  ALBUMIN 3.4*   Thyroid Function Tests  Recent Labs  06/18/14 1921  TSH 0.006*    TELE NSR with HR 70-80s, no significant ventricular ectopy overnight.    ECG  10/24 NSR with HR 80s. No new EKG  Echocardiogram 02/04/2014  LV EF: 55%  ------------------------------------------------------------------- Indications: CHF - 428.0.  ------------------------------------------------------------------- History:  PMH: Chronic obstructive pulmonary disease. Risk factors: Diabetes mellitus. Dyslipidemia.  ------------------------------------------------------------------- Study Conclusions  - Left ventricle: The cavity size was normal. Systolic function was normal. The estimated ejection fraction was 55%. Images were inadequate for LV wall motion assessment. - Ventricular septum: Septal motion showed paradox. These changes are consistent with intraventricular conduction delay. - Pulmonic valve: There was trivial regurgitation.      Radiology/Studies  Ct Abdomen Wo Contrast  2014-06-17   CLINICAL DATA:  metastatic breast cancer Breast cancer metastasized to bone, right C50.911, C79.51 (ICD-10-CM). Subsequent encounter.  EXAM: CT CHEST,  ABDOMEN AND PELVIS WITHOUT CONTRAST  TECHNIQUE: Multidetector CT imaging of the chest, abdomen and pelvis was performed following the standard protocol without IV contrast.  COMPARISON:  PET 08/10/2013. Chest radiograph of 04/02/2014. Prior diagnostic CTs of 05/13/2013.  FINDINGS: CT CHEST FINDINGS  Lungs/Pleura: Mild motion degradation. Subpleural right lower lobe 5 mm nodule on image 22, unchanged. 4 mm left upper lobe nodule on image 24 is similar.  The right lower lobe nodule described on the prior exam may be identified at 4 mm on image 22.  No pleural fluid.  Heart/Mediastinum: Left thyroid enlargement, similar. No axillary adenopathy. Mild cardiomegaly. Atherosclerosis, including within the coronary arteries. No pericardial effusion. No mediastinal or definite hilar adenopathy, given limitations of unenhanced CT.  CT ABDOMEN AND PELVIS FINDINGS  Motion degradation continuing into the abdomen and pelvis.  Hepatobiliary: Marked hepatic steatosis. Normal gallbladder, without biliary ductal dilatation.  Pancreas: Normal, without mass or pancreatic ductal dilatation.  Spleen: Normal  Urinary tract: Normal adrenal glands. Staghorn left renal calculus. Maximally 3.2 cm. Mild left renal cortical thinning. No gross hydronephrosis. Normal right kidney. No bladder calculi.  Stomach/Bowel: Normal stomach, without wall thickening. Normal terminal ileum and appendix. Normal small bowel without abdominal ascites.  Vascular/Lymphatic: Aortic and branch vessel atherosclerosis. Preaortic node measures 9 mm on image 64 versus 7 mm on the prior. The left periaortic node measures 1.0 cm and maintains its fatty hilum. Unchanged. Prominent inguinal nodes are similar and likely reactive.  Reproductive: Dystrophic calcifications within the uterus likely relate to underlying fibroids. No adnexal mass.  Other: No evidence of omental or peritoneal disease. No significant free fluid.  Musculoskeletal: Widespread osseous metastasis, as  evidenced by heterogeneous marrow density. More well-defined lucent lesions today. Example 2.0 cm left acetabular lesion versus 1.1 cm on 05/13/2013. Eccentric right L3 vertebral body lesion on image 68 of series 2.  New lytic lesion in the right humeral head on image 5. Interval healing left-sided rib fractures, likely pathologic.  IMPRESSION: CT CHEST IMPRESSION  1. Motion degraded exam. 2. No evidence of extraosseous metastatic disease within the chest. 3. Similar nonspecific pulmonary nodules. 4. Progressive osseous metastasis.  CT ABDOMEN AND PELVIS IMPRESSION  1. Motion degraded exam. 2. Slight enlargement of a preaortic node which is indeterminate. Recommend attention on follow-up. 3. Progressive osseous metastasis, including development of multiple lytic lesions and presumably pathologic left rib fractures. The vertebral body lesions could predispose the patient to cord compression. If there is any such symptoms, pre and post contrast spine MRI should be considered. Left acetabular lesion could predispose the patient to pathologic fracture 4. Left sided staghorn calculus. 5. Hepatic steatosis.   Electronically Signed   By: Abigail Miyamoto M.D.   On: 06-17-14 16:14   Dg Chest 2 View  06/19/2014   CLINICAL DATA:  Chronic shortness of breath and wheezing. COPD. History metastatic breast cancer to bone.  EXAM: CHEST  2 VIEW  COMPARISON:  CT chest 06/07/2014 and chest radiograph 04/02/2014  FINDINGS: Myocardium and is stable. Lungs normally expanded and clear in the pulmonary vascularity is normal. There are healed left-sided rib fractures.  There is a focal irregular lytic lesion in the inferior body of the scapula. The lytic lesion is also noted in the distal third of the right clavicle.  IMPRESSION: 1. Cardiomegaly is stable. No acute cardiopulmonary disease identified. 2. At least 2 lytic bony lesions are seen, consistent bony metastatic disease, given patient's history.   Electronically Signed   By: Curlene Dolphin M.D.   On: 06/19/2014 18:44   Ct Chest Wo Contrast  06/07/2014   CLINICAL DATA:  metastatic breast cancer Breast cancer metastasized to bone, right C50.911, C79.51 (ICD-10-CM). Subsequent encounter.  EXAM: CT CHEST, ABDOMEN AND PELVIS WITHOUT CONTRAST  TECHNIQUE: Multidetector CT imaging of the chest, abdomen and pelvis was performed following the standard protocol without IV contrast.  COMPARISON:  PET 08/10/2013. Chest radiograph of 04/02/2014. Prior diagnostic CTs of 05/13/2013.  FINDINGS: CT CHEST FINDINGS  Lungs/Pleura: Mild motion degradation. Subpleural right lower lobe 5 mm nodule on image 22, unchanged. 4 mm left upper lobe nodule on image 24 is similar.  The right lower lobe nodule described on the prior exam may be identified at 4 mm on image 22.  No pleural fluid.  Heart/Mediastinum: Left thyroid enlargement, similar. No axillary adenopathy. Mild cardiomegaly. Atherosclerosis, including within the coronary arteries. No pericardial effusion. No mediastinal or definite hilar adenopathy, given limitations of unenhanced CT.  CT ABDOMEN AND PELVIS FINDINGS  Motion degradation continuing into the abdomen and pelvis.  Hepatobiliary: Marked hepatic steatosis. Normal gallbladder, without biliary ductal dilatation.  Pancreas: Normal, without mass or pancreatic ductal dilatation.  Spleen: Normal  Urinary tract: Normal adrenal glands. Staghorn left renal calculus. Maximally 3.2 cm. Mild left renal cortical thinning. No gross hydronephrosis. Normal right kidney. No bladder calculi.  Stomach/Bowel: Normal stomach, without wall thickening. Normal terminal ileum and appendix. Normal small bowel without abdominal ascites.  Vascular/Lymphatic: Aortic and branch vessel atherosclerosis. Preaortic node measures 9 mm on image 64 versus 7 mm on the prior. The left periaortic node measures 1.0 cm and maintains its fatty hilum. Unchanged. Prominent inguinal nodes are similar and likely reactive.  Reproductive:  Dystrophic calcifications within the uterus likely relate to underlying fibroids. No adnexal mass.  Other: No evidence of omental or peritoneal disease. No significant free fluid.  Musculoskeletal: Widespread osseous metastasis, as evidenced by heterogeneous marrow density. More well-defined lucent lesions today. Example 2.0 cm left acetabular lesion versus 1.1 cm on 05/13/2013. Eccentric right L3 vertebral body lesion on image 68 of series 2.  New lytic lesion in the right humeral head on image 5. Interval healing left-sided rib fractures, likely pathologic.  IMPRESSION: CT CHEST IMPRESSION  1. Motion degraded exam. 2. No evidence of extraosseous metastatic disease within the chest. 3. Similar nonspecific pulmonary nodules. 4. Progressive osseous metastasis.  CT ABDOMEN AND PELVIS IMPRESSION  1. Motion degraded exam. 2. Slight enlargement of a preaortic node which is indeterminate. Recommend attention on follow-up. 3. Progressive osseous metastasis, including development of multiple lytic lesions and presumably pathologic left rib fractures. The vertebral body lesions could predispose the patient to cord compression. If there is any such symptoms, pre and post contrast spine MRI should be considered. Left acetabular lesion could predispose the patient to pathologic fracture 4. Left sided staghorn calculus. 5. Hepatic steatosis.   Electronically Signed   By: Adria Devon.D.  On: 06/07/2014 16:14    ASSESSMENT AND PLAN  1. Acute on chronic diastolic HF   - reports 09-81 lbs weight gain, increased DOE, + LE edema and orthopna. No chest pain.   - 01/2014 echo LVEF 19%, diastolic function not described. Previous echo 02/2013 describes grade I diastolic dysfunction. Normal RV, normal PASP   - given metolazone yesterday, however per nursing, lasix never increased to 12 mg/hr overnight, it was increased this morning. Weight down 4 lbs from 252 to 248. - 7L.  - Low TSH, normal free T4 and T3. Does not appear to be  contributing to her edema   - patient wish to go home tonight as she has school, only mild basilar rale, most of fluid is in LE and abdomen, recommend continue lasix gtt for 1 more day and transition back to torsemide tomorrow (if she's willing to stay for 1 more day) 2. Stasis leg wounds   - wound care consult 3. Breast CA with bone metastasis 4.  [place] stage III with improving creatinine 5.  COPD per PCP 6.  DM type II per PCP  Signed, Almyra Deforest PA-C Pager: [phone]   Patient seen and personally examined by me.  Agree with note as outlined by Almyra Deforest, PA with minor changes.  Admitted with acute on chronic diastolic CHF and is net neg 7 L but only lost 4 lbs.  Agree with increased Lasix gtt for another 24 hours as she still has evidence of volume overload on exam.  Renal function continues to improve with diuresis.    Signed: Fransico Him, MD [facility]

## 2014-06-21 NOTE — Progress Notes (Signed)
Patient evaluated for community based chronic disease management services with [facility] as a benefit of patient's [employer]. Spoke with patient at bedside to explain [facility] services. Services have been accepted with written consent.  Patient has a Chiropractor that calls her at home at this time but would like closer monitor to assist with her CHF chronic disease management.  She has a scale at home but does not weigh daily.  She rates her desire to improve her compliance as a 8 on a 1-10 scale.  She has an aide in the home from [employer].  She receives services 5 days a week for 3 hours each day.  Patient will receive a post discharge transition of care call and will be evaluated for monthly home visits for assessments and disease process education.  Left contact information and [facility] literature at bedside. Made Inpatient Case Manager aware that [place] following. Of note, [facility] [address] [facility] Management services does not replace or interfere with any services that are arranged by inpatient case management or social work.  For additional questions or referrals please contact Corliss Blacker BSN RN [facility] Liaison at [phone].

## 2014-06-22 DIAGNOSIS — R0602 Shortness of breath: Secondary | ICD-10-CM

## 2014-06-22 LAB — URINALYSIS, ROUTINE W REFLEX MICROSCOPIC
Bilirubin Urine: NEGATIVE
Glucose, UA: NEGATIVE mg/dL
KETONES UR: NEGATIVE mg/dL
Nitrite: POSITIVE — AB
PROTEIN: NEGATIVE mg/dL
Specific Gravity, Urine: 1.007 (ref 1.005–1.030)
UROBILINOGEN UA: 0.2 mg/dL (ref 0.0–1.0)
pH: 6 (ref 5.0–8.0)

## 2014-06-22 LAB — BASIC METABOLIC PANEL
Anion gap: 15 (ref 5–15)
BUN: 47 mg/dL — ABNORMAL HIGH (ref 6–23)
CO2: 34 mEq/L — ABNORMAL HIGH (ref 19–32)
Calcium: 8.7 mg/dL (ref 8.4–10.5)
Chloride: 89 mEq/L — ABNORMAL LOW (ref 96–112)
Creatinine, Ser: 2.09 mg/dL — ABNORMAL HIGH (ref 0.50–1.10)
GFR, EST AFRICAN AMERICAN: 27 mL/min — AB (ref >90)
GFR, EST NON AFRICAN AMERICAN: 23 mL/min — AB (ref >90)
Glucose, Bld: 268 mg/dL — ABNORMAL HIGH (ref 70–99)
POTASSIUM: 2.7 meq/L — AB (ref 3.7–5.3)
Sodium: 138 mEq/L (ref 137–147)

## 2014-06-22 LAB — URINE MICROSCOPIC-ADD ON

## 2014-06-22 MED ORDER — POTASSIUM CHLORIDE CRYS ER 20 MEQ PO TBCR
40.0000 meq | EXTENDED_RELEASE_TABLET | Freq: Two times a day (BID) | ORAL | Status: DC
  Administered 2014-06-22 – 2014-06-23 (×3): 40 meq via ORAL
  Filled 2014-06-22 (×3): qty 2

## 2014-06-22 MED ORDER — POLYETHYLENE GLYCOL 3350 17 G PO PACK
17.0000 g | PACK | Freq: Every day | ORAL | Status: DC
  Administered 2014-06-22 – 2014-06-27 (×6): 17 g via ORAL
  Filled 2014-06-22 (×7): qty 1

## 2014-06-22 MED ORDER — POTASSIUM CHLORIDE 20 MEQ PO PACK
40.0000 meq | PACK | Freq: Two times a day (BID) | ORAL | Status: DC
Start: 2014-06-22 — End: 2014-06-22

## 2014-06-22 NOTE — Care Management Note (Addendum)
    Page 1 of 2   06/28/2014     2:28:51 PM CARE MANAGEMENT NOTE 06/28/2014  Patient:  Anita [name], Anita [name]   Account Number:  [id number]  Date Initiated:  06/22/2014  Documentation initiated by:  [facility]  Subjective/Objective Assessment:   68 y.o. female with a history of CAD and CHF. She has noticed a weight gain recently and feels that she may be at as much as 15-20 pounds.//Home with spouse.     Action/Plan:   Admit, IV Lasix drip.//Access for disposition needs.   Anticipated DC Date:  06/26/2014   Anticipated DC Plan:  [place]  CM consult      [facility] Choice  HOME HEALTH  Resumption Of Svcs/PTA Provider   Choice offered to / List presented to:          [facility] arranged  HH-1 RN  [place] agency  [place]   Status of service:  Completed, signed off Medicare Important Message given?  YES (If response is "NO", the following Medicare IM given date fields will be blank) Date Medicare IM given:  06/21/2014 Medicare IM given by:  [facility] Date Additional Medicare IM given:  06/25/2014 Additional Medicare IM given by:  Jonnie Finner  Discharge Disposition:  [place]  Per UR Regulation:  Reviewed for med. necessity/level of care/duration of stay  If discussed at [place] of Stay Meetings, dates discussed:    Comments:  06/28/14 [place], RN, BSN, [place] [phone] Additional IM given.  PT to d/c home today.  06/25/2014 1500 NCM spoke to pt and states she has oxygen with [employer]. Instructed pt to bring portable at dc to go home. Will fax [facility] dc summary at dc. Waiting final recommendations for home. Jonnie Finner RN CCM Case Mgmt phone [phone]    06/22/14 Fuller Mandril, RN, BSN, NCM [phone] On lasix gtt 12mg /hr, diuresed 11L. Weight down from 252 to 243. She was 236 lbs on d/c in August.  06/21/14 Fuller Mandril, RN,  BSN, [place] [phone] Pt currently active with [facility] for RN services.  Resumption of care requested.  Tye [place] of [facility] notified.  No DME needs identified at this time.

## 2014-06-22 NOTE — Progress Notes (Signed)
CRITICAL VALUE ALERT  Critical value received: potassium 2.7  Date of notification:  06/22/14  Time of notification:  7322  Critical value read back:Yes.    Nurse who received alert:  Shaune Pascal   MD notified (1st page): on call MD cardio  Time of first page:  1120  MD notified (2nd page):Meng, Isaac Laud   Time of second page:1150  Responding MD:  Almyra Deforest   Time MD responded:  1155

## 2014-06-22 NOTE — Progress Notes (Signed)
Patient Name: Anita [name] Date of Encounter: 06/22/2014     Principal Problem:   Acute on chronic respiratory failure Active Problems:   DM2 (diabetes mellitus, type 2)   COPD (chronic obstructive pulmonary disease)   Chronic kidney disease, stage III (moderate)   Anemia of chronic disease   Breast cancer metastasized to bone   Acute on chronic combined systolic and diastolic congestive heart failure, NYHA class 4   Acute on chronic diastolic heart failure   Acute on chronic diastolic ACC/AHA stage C congestive heart failure    SUBJECTIVE  Denies any CP or SOB. She states she has to go home today in order to go to classes  CURRENT MEDS . aspirin EC  81 mg Oral Daily  . atorvastatin  40 mg Oral Daily  . clotrimazole  1 application Topical Daily  . docusate sodium  100 mg Oral BID  . heparin subcutaneous  5,000 Units Subcutaneous 3 times per day  . Influenza vac split quadrivalent PF  0.5 mL Intramuscular Tomorrow-1000  . isosorbide mononitrate  60 mg Oral Daily  . letrozole  2.5 mg Oral Daily  . mirtazapine  15 mg Oral QHS  . palbociclib  125 mg Oral Daily  . pneumococcal 23 valent vaccine  0.5 mL Intramuscular Tomorrow-1000  . potassium chloride SA  40 mEq Oral BID  . tiotropium  18 mcg Inhalation Daily    OBJECTIVE  Filed Vitals:   06/21/14 1031 06/21/14 1435 06/21/14 1957 06/22/14 0412  BP: 124/65 110/48 118/51 108/60  Pulse: 89 82 97 81  Temp:  98.5 F (36.9 C) 98 F (36.7 C) 98 F (36.7 C)  TempSrc:  Oral Oral Oral  Resp:  20 18 18   Height:      Weight:    243 lb (110.224 kg)  SpO2:  100% 100% 100%    Intake/Output Summary (Last 24 hours) at 06/22/14 0946 Last data filed at 06/22/14 0905  Gross per 24 hour  Intake   1852 ml  Output   6325 ml  Net  -4473 ml   Filed Weights   06/20/14 0457 06/21/14 0547 06/22/14 0412  Weight: 252 lb (114.306 kg) 248 lb 0.3 oz (112.5 kg) 243 lb (110.224 kg)    PHYSICAL EXAM  General: Pleasant, NAD. Neuro:  Alert and oriented X 3. Moves all extremities spontaneously. Psych: Normal affect. HEENT:  Normal  Neck: Supple without bruits or JVD. Lungs:  Resp regular and unlabored, decreased breath sound Heart: RRR no s3, s4, or murmurs. Abdomen: Soft, non-tender, non-distended, BS + x 4.  Extremities: No clubbing, cyanosis. DP/PT/Radials 2+ and equal bilaterally. 1+ edema, some dry skin, ?chronic cellulitis  Accessory Clinical Findings  Basic Metabolic Panel  Recent Labs  06/20/14 0318 06/21/14 0418  NA 140 138  K 4.1 4.1  CL 100 95*  CO2 28 26  GLUCOSE 142* 180*  BUN 41* 41*  CREATININE 1.94* 1.88*  CALCIUM 8.7 8.7    TELE NSR with HR 80-110s    ECG  No new EKG  Echocardiogram 02/04/2014  LV EF: 55%  ------------------------------------------------------------------- Indications: CHF - 428.0.  ------------------------------------------------------------------- History: PMH: Chronic obstructive pulmonary disease. Risk factors: Diabetes mellitus. Dyslipidemia.  ------------------------------------------------------------------- Study Conclusions  - Left ventricle: The cavity size was normal. Systolic function was normal. The estimated ejection fraction was 55%. Images were inadequate for LV wall motion assessment. - Ventricular septum: Septal motion showed paradox. These changes are consistent with intraventricular conduction delay. - Pulmonic valve: There was  trivial regurgitation.      Radiology/Studies  Ct Abdomen Wo Contrast  06/22/14   CLINICAL DATA:  metastatic breast cancer Breast cancer metastasized to bone, right C50.911, C79.51 (ICD-10-CM). Subsequent encounter.  EXAM: CT CHEST, ABDOMEN AND PELVIS WITHOUT CONTRAST  TECHNIQUE: Multidetector CT imaging of the chest, abdomen and pelvis was performed following the standard protocol without IV contrast.  COMPARISON:  PET 08/10/2013. Chest radiograph of 04/02/2014. Prior diagnostic CTs of 05/13/2013.   FINDINGS: CT CHEST FINDINGS  Lungs/Pleura: Mild motion degradation. Subpleural right lower lobe 5 mm nodule on image 22, unchanged. 4 mm left upper lobe nodule on image 24 is similar.  The right lower lobe nodule described on the prior exam may be identified at 4 mm on image 22.  No pleural fluid.  Heart/Mediastinum: Left thyroid enlargement, similar. No axillary adenopathy. Mild cardiomegaly. Atherosclerosis, including within the coronary arteries. No pericardial effusion. No mediastinal or definite hilar adenopathy, given limitations of unenhanced CT.  CT ABDOMEN AND PELVIS FINDINGS  Motion degradation continuing into the abdomen and pelvis.  Hepatobiliary: Marked hepatic steatosis. Normal gallbladder, without biliary ductal dilatation.  Pancreas: Normal, without mass or pancreatic ductal dilatation.  Spleen: Normal  Urinary tract: Normal adrenal glands. Staghorn left renal calculus. Maximally 3.2 cm. Mild left renal cortical thinning. No gross hydronephrosis. Normal right kidney. No bladder calculi.  Stomach/Bowel: Normal stomach, without wall thickening. Normal terminal ileum and appendix. Normal small bowel without abdominal ascites.  Vascular/Lymphatic: Aortic and branch vessel atherosclerosis. Preaortic node measures 9 mm on image 64 versus 7 mm on the prior. The left periaortic node measures 1.0 cm and maintains its fatty hilum. Unchanged. Prominent inguinal nodes are similar and likely reactive.  Reproductive: Dystrophic calcifications within the uterus likely relate to underlying fibroids. No adnexal mass.  Other: No evidence of omental or peritoneal disease. No significant free fluid.  Musculoskeletal: Widespread osseous metastasis, as evidenced by heterogeneous marrow density. More well-defined lucent lesions today. Example 2.0 cm left acetabular lesion versus 1.1 cm on 05/13/2013. Eccentric right L3 vertebral body lesion on image 68 of series 2.  New lytic lesion in the right humeral head on image 5.  Interval healing left-sided rib fractures, likely pathologic.  IMPRESSION: CT CHEST IMPRESSION  1. Motion degraded exam. 2. No evidence of extraosseous metastatic disease within the chest. 3. Similar nonspecific pulmonary nodules. 4. Progressive osseous metastasis.  CT ABDOMEN AND PELVIS IMPRESSION  1. Motion degraded exam. 2. Slight enlargement of a preaortic node which is indeterminate. Recommend attention on follow-up. 3. Progressive osseous metastasis, including development of multiple lytic lesions and presumably pathologic left rib fractures. The vertebral body lesions could predispose the patient to cord compression. If there is any such symptoms, pre and post contrast spine MRI should be considered. Left acetabular lesion could predispose the patient to pathologic fracture 4. Left sided staghorn calculus. 5. Hepatic steatosis.   Electronically Signed   By: Abigail Miyamoto M.D.   On: 06-22-2014 16:14   Dg Chest 2 View  06/19/2014   CLINICAL DATA:  Chronic shortness of breath and wheezing. COPD. History metastatic breast cancer to bone.  EXAM: CHEST  2 VIEW  COMPARISON:  CT chest 06/22/2014 and chest radiograph 04/02/2014  FINDINGS: Myocardium and is stable. Lungs normally expanded and clear in the pulmonary vascularity is normal. There are healed left-sided rib fractures.  There is a focal irregular lytic lesion in the inferior body of the scapula. The lytic lesion is also noted in the distal third of the right  clavicle.  IMPRESSION: 1. Cardiomegaly is stable. No acute cardiopulmonary disease identified. 2. At least 2 lytic bony lesions are seen, consistent bony metastatic disease, given patient's history.   Electronically Signed   By: Curlene Dolphin M.D.   On: 06/19/2014 18:44   Ct Chest Wo Contrast  06/07/2014   CLINICAL DATA:  metastatic breast cancer Breast cancer metastasized to bone, right C50.911, C79.51 (ICD-10-CM). Subsequent encounter.  EXAM: CT CHEST, ABDOMEN AND PELVIS WITHOUT CONTRAST   TECHNIQUE: Multidetector CT imaging of the chest, abdomen and pelvis was performed following the standard protocol without IV contrast.  COMPARISON:  PET 08/10/2013. Chest radiograph of 04/02/2014. Prior diagnostic CTs of 05/13/2013.  FINDINGS: CT CHEST FINDINGS  Lungs/Pleura: Mild motion degradation. Subpleural right lower lobe 5 mm nodule on image 22, unchanged. 4 mm left upper lobe nodule on image 24 is similar.  The right lower lobe nodule described on the prior exam may be identified at 4 mm on image 22.  No pleural fluid.  Heart/Mediastinum: Left thyroid enlargement, similar. No axillary adenopathy. Mild cardiomegaly. Atherosclerosis, including within the coronary arteries. No pericardial effusion. No mediastinal or definite hilar adenopathy, given limitations of unenhanced CT.  CT ABDOMEN AND PELVIS FINDINGS  Motion degradation continuing into the abdomen and pelvis.  Hepatobiliary: Marked hepatic steatosis. Normal gallbladder, without biliary ductal dilatation.  Pancreas: Normal, without mass or pancreatic ductal dilatation.  Spleen: Normal  Urinary tract: Normal adrenal glands. Staghorn left renal calculus. Maximally 3.2 cm. Mild left renal cortical thinning. No gross hydronephrosis. Normal right kidney. No bladder calculi.  Stomach/Bowel: Normal stomach, without wall thickening. Normal terminal ileum and appendix. Normal small bowel without abdominal ascites.  Vascular/Lymphatic: Aortic and branch vessel atherosclerosis. Preaortic node measures 9 mm on image 64 versus 7 mm on the prior. The left periaortic node measures 1.0 cm and maintains its fatty hilum. Unchanged. Prominent inguinal nodes are similar and likely reactive.  Reproductive: Dystrophic calcifications within the uterus likely relate to underlying fibroids. No adnexal mass.  Other: No evidence of omental or peritoneal disease. No significant free fluid.  Musculoskeletal: Widespread osseous metastasis, as evidenced by heterogeneous marrow  density. More well-defined lucent lesions today. Example 2.0 cm left acetabular lesion versus 1.1 cm on 05/13/2013. Eccentric right L3 vertebral body lesion on image 68 of series 2.  New lytic lesion in the right humeral head on image 5. Interval healing left-sided rib fractures, likely pathologic.  IMPRESSION: CT CHEST IMPRESSION  1. Motion degraded exam. 2. No evidence of extraosseous metastatic disease within the chest. 3. Similar nonspecific pulmonary nodules. 4. Progressive osseous metastasis.  CT ABDOMEN AND PELVIS IMPRESSION  1. Motion degraded exam. 2. Slight enlargement of a preaortic node which is indeterminate. Recommend attention on follow-up. 3. Progressive osseous metastasis, including development of multiple lytic lesions and presumably pathologic left rib fractures. The vertebral body lesions could predispose the patient to cord compression. If there is any such symptoms, pre and post contrast spine MRI should be considered. Left acetabular lesion could predispose the patient to pathologic fracture 4. Left sided staghorn calculus. 5. Hepatic steatosis.   Electronically Signed   By: Abigail Miyamoto M.D.   On: 06/07/2014 16:14    ASSESSMENT AND PLAN  1. Acute on chronic diastolic HF   - reports 44-03 lbs weight gain, increased DOE, + LE edema and orthopna. No chest pain.   - 01/2014 echo LVEF 47%, diastolic function not described. Previous echo 02/2013 describes grade I diastolic dysfunction. Normal RV, normal PASP   -  Low TSH, normal free T4 and T3. Does not appear to be contributing to her edema   - on lasix gtt 12mg /hr, diuresed 11L. Weight down from 252 to 243. She was 236 lbs on discharge in Aug. Ideally, would diuresis further with Lasix gtt and reassess in the am. 2. Stasis leg wounds  - wound care consulted, dressing changed   3. Breast CA with bone metastasis  4. [place] stage III with improving creatinine  5. COPD per PCP  6. DM type II per PCP 7. Dysuria with foul odor: obtaining  urinalysis  Signed, Woodward Ku Pager: [phone]  Patient seen and personally examined by me.  Agree with note by Almyra Deforest PA with minor changes.  She has responded well to Lasix gtt but weight is still up.  Creatinine trending downward.  She still has volume overload on exam. Will continue on current dose of Lasix gtt and reassess in the am.  Signed: Fransico Him, MD [facility] 06/22/2014

## 2014-06-23 ENCOUNTER — Ambulatory Visit: Payer: Commercial Managed Care - HMO | Admitting: Physician Assistant

## 2014-06-23 DIAGNOSIS — N390 Urinary tract infection, site not specified: Secondary | ICD-10-CM | POA: Diagnosis not present

## 2014-06-23 DIAGNOSIS — N3000 Acute cystitis without hematuria: Secondary | ICD-10-CM

## 2014-06-23 LAB — BASIC METABOLIC PANEL
Anion gap: 17 — ABNORMAL HIGH (ref 5–15)
Anion gap: 14 (ref 5–15)
BUN: 53 mg/dL — ABNORMAL HIGH (ref 6–23)
BUN: 54 mg/dL — ABNORMAL HIGH (ref 6–23)
CALCIUM: 8.2 mg/dL — AB (ref 8.4–10.5)
CALCIUM: 8.4 mg/dL (ref 8.4–10.5)
CHLORIDE: 90 meq/L — AB (ref 96–112)
CO2: 31 mEq/L (ref 19–32)
CO2: 33 mEq/L — ABNORMAL HIGH (ref 19–32)
CREATININE: 1.94 mg/dL — AB (ref 0.50–1.10)
Chloride: 90 mEq/L — ABNORMAL LOW (ref 96–112)
Creatinine, Ser: 2.08 mg/dL — ABNORMAL HIGH (ref 0.50–1.10)
GFR calc Af Amer: 27 mL/min — ABNORMAL LOW (ref >90)
GFR calc Af Amer: 30 mL/min — ABNORMAL LOW (ref >90)
GFR calc non Af Amer: 24 mL/min — ABNORMAL LOW (ref >90)
GFR, EST NON AFRICAN AMERICAN: 26 mL/min — AB (ref >90)
GLUCOSE: 181 mg/dL — AB (ref 70–99)
GLUCOSE: 212 mg/dL — AB (ref 70–99)
POTASSIUM: 3.1 meq/L — AB (ref 3.7–5.3)
Potassium: 3 mEq/L — ABNORMAL LOW (ref 3.7–5.3)
Sodium: 138 mEq/L (ref 137–147)
Sodium: 137 mEq/L (ref 137–147)

## 2014-06-23 MED ORDER — POTASSIUM CHLORIDE CRYS ER 20 MEQ PO TBCR
40.0000 meq | EXTENDED_RELEASE_TABLET | Freq: Once | ORAL | Status: AC
  Administered 2014-06-23: 40 meq via ORAL
  Filled 2014-06-23: qty 2

## 2014-06-23 MED ORDER — CIPROFLOXACIN HCL 250 MG PO TABS
250.0000 mg | ORAL_TABLET | Freq: Two times a day (BID) | ORAL | Status: AC
  Administered 2014-06-23 – 2014-06-25 (×6): 250 mg via ORAL
  Filled 2014-06-23 (×6): qty 1

## 2014-06-23 MED ORDER — METOLAZONE 2.5 MG PO TABS
2.5000 mg | ORAL_TABLET | Freq: Two times a day (BID) | ORAL | Status: DC
  Filled 2014-06-23 (×3): qty 1

## 2014-06-23 MED ORDER — METOLAZONE 5 MG PO TABS
5.0000 mg | ORAL_TABLET | Freq: Once | ORAL | Status: AC
  Administered 2014-06-23: 5 mg via ORAL
  Filled 2014-06-23: qty 1

## 2014-06-23 MED ORDER — POTASSIUM CHLORIDE CRYS ER 20 MEQ PO TBCR
40.0000 meq | EXTENDED_RELEASE_TABLET | Freq: Three times a day (TID) | ORAL | Status: DC
  Administered 2014-06-23 – 2014-06-26 (×11): 40 meq via ORAL
  Filled 2014-06-23 (×16): qty 2

## 2014-06-23 NOTE — Progress Notes (Signed)
Admitted with COPD, CHF, CAD, and DM.  Recommend checking CBGs TID & HS and if blood sugars greater than 180 mg/dl, start Novolog SENSITIVE correction scale TID & HS while in the hospital.  Harvel Ricks RN BSN CDE

## 2014-06-23 NOTE — Progress Notes (Signed)
Advanced Heart Failure Rounding Note  Primary Physician: [name], DO  Primary Cardiologist: Dr Percival Spanish  Subjective:    The patient is a 68 year old female, followed by Dr. Percival Spanish with multiple medical problems, which include a h/o CAD s/p NSTEMI in 2014, COPD, obesity, HTN, DM, diastolic HF and breast cancer with bone metastasis.    She has been admitted multiple times (5 in the past 6 months) for A/C HF. Presented to the ED on 06/18/14 for volume overload and increased SOB and DOE. Attempts were made to manage her as OP but failed.  She states she drinks a lot of water and ice at home and is afraid to take extra torsemide due to worrying about "drying my kidneys out."   Remains on lasix gtt 12 mg/hr. 24 hr I/O -2.1 liters and weight stable. Renal function stable. Denies CP. SOB with minimal exertion.    Objective:   Weight Range:  Vital Signs:   Temp:  [97.6 F (36.4 C)-98.3 F (36.8 C)] 98.2 F (36.8 C) (10/28 0444) Pulse Rate:  [83-108] 108 (10/28 0952) Resp:  [18] 18 (10/28 0444) BP: (95-115)/(40-74) 104/40 mmHg (10/28 0952) SpO2:  [92 %-100 %] 92 % (10/28 0952) Weight:  [243 lb 11.2 oz (110.542 kg)] 243 lb 11.2 oz (110.542 kg) (10/28 0444) Last BM Date: 06/22/14  Weight change: Filed Weights   06/21/14 0547 06/22/14 0412 06/23/14 0444  Weight: 248 lb 0.3 oz (112.5 kg) 243 lb (110.224 kg) 243 lb 11.2 oz (110.542 kg)    Intake/Output:   Intake/Output Summary (Last 24 hours) at 06/23/14 1102 Last data filed at 06/23/14 0950  Gross per 24 hour  Intake    972 ml  Output   3150 ml  Net  -2178 ml     Physical Exam: General:  Chronically ill appearing. No resp difficulty, sitting up in chair, very lethargic HEENT: normal Neck: supple. JVP difficult to assess d/t body habitus but appears to at least jaw. Carotids 2+ bilat; no bruits. No lymphadenopathy or thryomegaly appreciated. Cor: PMI nondisplaced. Regular rate & rhythm. No rubs, gallops or murmurs. Lungs:  clear with decreased BS throughout Abdomen:  Obese soft, nontender, +distended. No hepatosplenomegaly. No bruits or masses. Good bowel sounds. Extremities: no cyanosis, clubbing, rash, bilateral 3+ edema with venous stasis ulcers Neuro: alert & orientedx3, cranial nerves grossly intact. moves all 4 extremities w/o difficulty. Affect pleasant  Telemetry: SR 60s  Labs: Basic Metabolic Panel:  Recent Labs Lab 06/19/14 0322 06/20/14 0318 06/21/14 0418 06/22/14 1020 06/23/14 0521  NA 141 140 138 138 138  K 4.1 4.1 4.1 2.7* 3.0*  CL 103 100 95* 89* 90*  CO2 $Re'28 28 26 'NLj$ 34* 31  GLUCOSE 155* 142* 180* 268* 181*  BUN 40* 41* 41* 47* 53*  CREATININE 2.12* 1.94* 1.88* 2.09* 2.08*  CALCIUM 8.2* 8.7 8.7 8.7 8.2*    Liver Function Tests:  Recent Labs Lab 06/18/14 1921  AST 22  ALT 24  ALKPHOS 105  BILITOT <0.2*  PROT 6.9  ALBUMIN 3.4*   No results found for this basename: LIPASE, AMYLASE,  in the last 168 hours No results found for this basename: AMMONIA,  in the last 168 hours  CBC:  Recent Labs Lab 06/18/14 1921  WBC 4.5  NEUTROABS 3.8  HGB 8.5*  HCT 27.0*  MCV 94.1  PLT 250    Cardiac Enzymes: No results found for this basename: CKTOTAL, CKMB, CKMBINDEX, TROPONINI,  in the last 168 hours  BNP: BNP (last 3  results)  Recent Labs  04/01/14 2214 04/04/14 0411 06/18/14 1921  PROBNP 588.7* 328.1* 612.6*     Other results:  EKG:   Imaging:  No results found.   Medications:     Scheduled Medications: . aspirin EC  81 mg Oral Daily  . atorvastatin  40 mg Oral Daily  . ciprofloxacin  250 mg Oral BID  . clotrimazole  1 application Topical Daily  . docusate sodium  100 mg Oral BID  . heparin subcutaneous  5,000 Units Subcutaneous 3 times per day  . Influenza vac split quadrivalent PF  0.5 mL Intramuscular Tomorrow-1000  . isosorbide mononitrate  60 mg Oral Daily  . letrozole  2.5 mg Oral Daily  . mirtazapine  15 mg Oral QHS  . palbociclib  125 mg  Oral Daily  . pneumococcal 23 valent vaccine  0.5 mL Intramuscular Tomorrow-1000  . polyethylene glycol  17 g Oral Daily  . potassium chloride  40 mEq Oral BID  . tiotropium  18 mcg Inhalation Daily     Infusions: . furosemide (LASIX) infusion 12 mg/hr (06/22/14 2339)     PRN Medications:  albuterol   Assessment/Plan/Discussion   1. Acute on chronic diastolic HF  - Presented with increased weight gain which she reported 15-20 lbs, increased DOE, + LE edema and orthopnea. Likely due to dietary indiscretion  01/2014 echo LVEF 55%, grade 1 DD  RV ok - Last weight at discharge was 236 lbs, however she reports her dry weight is 220 lbs.  2. Stasis leg wounds  - wound care consulted, dressing changed. They are using kerlex and coban.  3. Breast CA with bone metastasis  - Met with palliative care on previous admission and wants to remain FULL CODE.  4. CKD stage III-IV: - Baseline creatinine difficult to judge has been anywhere from 1.4-2.2. Will continue to follow.  5. Hypokalemia - Supplemented and start potassium 40 meq TID.    Length of Stay: 5 Rande Brunt NP-C 06/23/2014, 11:02 AM  Advanced Heart Failure Team Pager [phone] (M-F; 7a - 4p)  Please contact [facility] for night-coverage after hours (4p -7a ) and weekends on [email or web address]  Patient seen and examined with Junie Bame, NP. We discussed all aspects of the encounter. I agree with the assessment and plan as stated above.   She is markedly volume overloaded in the setting of noncompliance with fluid restriction and hesitation to use sliding scale diuretic regimen. Agree with lasix gtt. Will add metolazone. Long talk about how to use sliding scale diuretic regimen at home. On discharge would keep her on current torsemide regimen but have her take metolazone 2.5 as needed for weight gain > 3 pounds from baseline. Discussed need to decrease fluid and ice intake.   Mase Dhondt,MD 4:56 PM

## 2014-06-23 NOTE — Progress Notes (Signed)
SUBJECTIVE:  Denies SOB this am.  A little sleepy  OBJECTIVE:   Vitals:   Filed Vitals:   06/22/14 1438 06/22/14 2026 06/23/14 0444 06/23/14 0827  BP: 95/74 114/50 115/49   Pulse: 100 103 83   Temp: 97.6 F (36.4 C) 98.3 F (36.8 C) 98.2 F (36.8 C)   TempSrc: Oral Oral Oral   Resp: 18 18 18    Height:      Weight:   243 lb 11.2 oz (110.542 kg)   SpO2: 96% 95% 98% 100%   I&O's:   Intake/Output Summary (Last 24 hours) at 06/23/14 0856 Last data filed at 06/23/14 0731  Gross per 24 hour  Intake   1092 ml  Output   2750 ml  Net  -1658 ml   TELEMETRY: Reviewed telemetry pt in NSR:     PHYSICAL EXAM General: Well developed, well nourished, in no acute distress Head: Eyes PERRLA, No xanthomas.   Normal cephalic and atramatic  Lungs:   Clear bilaterally to auscultation anteriorly Heart:   HRRR S1 S2 Pulses are 2+ & equal. Abdomen: Bowel sounds are positive, abdomen soft and non-tender without masses Extremities:   3+ edema with venous stasis ulcers Neuro: Alert and oriented X 3. Psych:  Good affect, responds appropriately   LABS: Basic Metabolic Panel:  Recent Labs  06/22/14 1020 06/23/14 0521  NA 138 138  K 2.7* 3.0*  CL 89* 90*  CO2 34* 31  GLUCOSE 268* 181*  BUN 47* 53*  CREATININE 2.09* 2.08*  CALCIUM 8.7 8.2*   Liver Function Tests: No results found for this basename: AST, ALT, ALKPHOS, BILITOT, PROT, ALBUMIN,  in the last 72 hours No results found for this basename: LIPASE, AMYLASE,  in the last 72 hours CBC: No results found for this basename: WBC, NEUTROABS, HGB, HCT, MCV, PLT,  in the last 72 hours Cardiac Enzymes: No results found for this basename: CKTOTAL, CKMB, CKMBINDEX, TROPONINI,  in the last 72 hours BNP: No components found with this basename: POCBNP,  D-Dimer: No results found for this basename: DDIMER,  in the last 72 hours Hemoglobin A1C: No results found for this basename: HGBA1C,  in the last 72 hours Fasting Lipid Panel: No  results found for this basename: CHOL, HDL, LDLCALC, TRIG, CHOLHDL, LDLDIRECT,  in the last 72 hours Thyroid Function Tests: No results found for this basename: TSH, T4TOTAL, FREET3, T3FREE, THYROIDAB,  in the last 72 hours Anemia Panel: No results found for this basename: VITAMINB12, FOLATE, FERRITIN, TIBC, IRON, RETICCTPCT,  in the last 72 hours Coag Panel:   Lab Results  Component Value Date   INR 1.04 06/18/2014   INR 0.97 07/06/2013   INR 0.96 03/13/2013    RADIOLOGY: Ct Abdomen Wo Contrast  06/07/2014   CLINICAL DATA:  metastatic breast cancer Breast cancer metastasized to bone, right C50.911, C79.51 (ICD-10-CM). Subsequent encounter.  EXAM: CT CHEST, ABDOMEN AND PELVIS WITHOUT CONTRAST  TECHNIQUE: Multidetector CT imaging of the chest, abdomen and pelvis was performed following the standard protocol without IV contrast.  COMPARISON:  PET 08/10/2013. Chest radiograph of 04/02/2014. Prior diagnostic CTs of 05/13/2013.  FINDINGS: CT CHEST FINDINGS  Lungs/Pleura: Mild motion degradation. Subpleural right lower lobe 5 mm nodule on image 22, unchanged. 4 mm left upper lobe nodule on image 24 is similar.  The right lower lobe nodule described on the prior exam may be identified at 4 mm on image 22.  No pleural fluid.  Heart/Mediastinum: Left thyroid enlargement, similar. No axillary  adenopathy. Mild cardiomegaly. Atherosclerosis, including within the coronary arteries. No pericardial effusion. No mediastinal or definite hilar adenopathy, given limitations of unenhanced CT.  CT ABDOMEN AND PELVIS FINDINGS  Motion degradation continuing into the abdomen and pelvis.  Hepatobiliary: Marked hepatic steatosis. Normal gallbladder, without biliary ductal dilatation.  Pancreas: Normal, without mass or pancreatic ductal dilatation.  Spleen: Normal  Urinary tract: Normal adrenal glands. Staghorn left renal calculus. Maximally 3.2 cm. Mild left renal cortical thinning. No gross hydronephrosis. Normal right kidney.  No bladder calculi.  Stomach/Bowel: Normal stomach, without wall thickening. Normal terminal ileum and appendix. Normal small bowel without abdominal ascites.  Vascular/Lymphatic: Aortic and branch vessel atherosclerosis. Preaortic node measures 9 mm on image 64 versus 7 mm on the prior. The left periaortic node measures 1.0 cm and maintains its fatty hilum. Unchanged. Prominent inguinal nodes are similar and likely reactive.  Reproductive: Dystrophic calcifications within the uterus likely relate to underlying fibroids. No adnexal mass.  Other: No evidence of omental or peritoneal disease. No significant free fluid.  Musculoskeletal: Widespread osseous metastasis, as evidenced by heterogeneous marrow density. More well-defined lucent lesions today. Example 2.0 cm left acetabular lesion versus 1.1 cm on 05/13/2013. Eccentric right L3 vertebral body lesion on image 68 of series 2.  New lytic lesion in the right humeral head on image 5. Interval healing left-sided rib fractures, likely pathologic.  IMPRESSION: CT CHEST IMPRESSION  1. Motion degraded exam. 2. No evidence of extraosseous metastatic disease within the chest. 3. Similar nonspecific pulmonary nodules. 4. Progressive osseous metastasis.  CT ABDOMEN AND PELVIS IMPRESSION  1. Motion degraded exam. 2. Slight enlargement of a preaortic node which is indeterminate. Recommend attention on follow-up. 3. Progressive osseous metastasis, including development of multiple lytic lesions and presumably pathologic left rib fractures. The vertebral body lesions could predispose the patient to cord compression. If there is any such symptoms, pre and post contrast spine MRI should be considered. Left acetabular lesion could predispose the patient to pathologic fracture 4. Left sided staghorn calculus. 5. Hepatic steatosis.   Electronically Signed   By: Abigail Miyamoto M.D.   On: 06/07/2014 16:14   Dg Chest 2 View  06/19/2014   CLINICAL DATA:  Chronic shortness of breath  and wheezing. COPD. History metastatic breast cancer to bone.  EXAM: CHEST  2 VIEW  COMPARISON:  CT chest 06/07/2014 and chest radiograph 04/02/2014  FINDINGS: Myocardium and is stable. Lungs normally expanded and clear in the pulmonary vascularity is normal. There are healed left-sided rib fractures.  There is a focal irregular lytic lesion in the inferior body of the scapula. The lytic lesion is also noted in the distal third of the right clavicle.  IMPRESSION: 1. Cardiomegaly is stable. No acute cardiopulmonary disease identified. 2. At least 2 lytic bony lesions are seen, consistent bony metastatic disease, given patient's history.   Electronically Signed   By: Curlene Dolphin M.D.   On: 06/19/2014 18:44   Ct Chest Wo Contrast  06/07/2014   CLINICAL DATA:  metastatic breast cancer Breast cancer metastasized to bone, right C50.911, C79.51 (ICD-10-CM). Subsequent encounter.  EXAM: CT CHEST, ABDOMEN AND PELVIS WITHOUT CONTRAST  TECHNIQUE: Multidetector CT imaging of the chest, abdomen and pelvis was performed following the standard protocol without IV contrast.  COMPARISON:  PET 08/10/2013. Chest radiograph of 04/02/2014. Prior diagnostic CTs of 05/13/2013.  FINDINGS: CT CHEST FINDINGS  Lungs/Pleura: Mild motion degradation. Subpleural right lower lobe 5 mm nodule on image 22, unchanged. 4 mm left upper lobe nodule  on image 24 is similar.  The right lower lobe nodule described on the prior exam may be identified at 4 mm on image 22.  No pleural fluid.  Heart/Mediastinum: Left thyroid enlargement, similar. No axillary adenopathy. Mild cardiomegaly. Atherosclerosis, including within the coronary arteries. No pericardial effusion. No mediastinal or definite hilar adenopathy, given limitations of unenhanced CT.  CT ABDOMEN AND PELVIS FINDINGS  Motion degradation continuing into the abdomen and pelvis.  Hepatobiliary: Marked hepatic steatosis. Normal gallbladder, without biliary ductal dilatation.  Pancreas: Normal,  without mass or pancreatic ductal dilatation.  Spleen: Normal  Urinary tract: Normal adrenal glands. Staghorn left renal calculus. Maximally 3.2 cm. Mild left renal cortical thinning. No gross hydronephrosis. Normal right kidney. No bladder calculi.  Stomach/Bowel: Normal stomach, without wall thickening. Normal terminal ileum and appendix. Normal small bowel without abdominal ascites.  Vascular/Lymphatic: Aortic and branch vessel atherosclerosis. Preaortic node measures 9 mm on image 64 versus 7 mm on the prior. The left periaortic node measures 1.0 cm and maintains its fatty hilum. Unchanged. Prominent inguinal nodes are similar and likely reactive.  Reproductive: Dystrophic calcifications within the uterus likely relate to underlying fibroids. No adnexal mass.  Other: No evidence of omental or peritoneal disease. No significant free fluid.  Musculoskeletal: Widespread osseous metastasis, as evidenced by heterogeneous marrow density. More well-defined lucent lesions today. Example 2.0 cm left acetabular lesion versus 1.1 cm on 05/13/2013. Eccentric right L3 vertebral body lesion on image 68 of series 2.  New lytic lesion in the right humeral head on image 5. Interval healing left-sided rib fractures, likely pathologic.  IMPRESSION: CT CHEST IMPRESSION  1. Motion degraded exam. 2. No evidence of extraosseous metastatic disease within the chest. 3. Similar nonspecific pulmonary nodules. 4. Progressive osseous metastasis.  CT ABDOMEN AND PELVIS IMPRESSION  1. Motion degraded exam. 2. Slight enlargement of a preaortic node which is indeterminate. Recommend attention on follow-up. 3. Progressive osseous metastasis, including development of multiple lytic lesions and presumably pathologic left rib fractures. The vertebral body lesions could predispose the patient to cord compression. If there is any such symptoms, pre and post contrast spine MRI should be considered. Left acetabular lesion could predispose the patient  to pathologic fracture 4. Left sided staghorn calculus. 5. Hepatic steatosis.   Electronically Signed   By: Abigail Miyamoto M.D.   On: 06/07/2014 16:14    ASSESSMENT AND PLAN  1. Acute on chronic diastolic HF  - reports 72-53 lbs weight gain, increased DOE, + LE edema and orthopna. No chest pain.  - 01/2014 echo LVEF 66%, diastolic function not described. Previous echo 02/2013 describes grade I diastolic dysfunction. Normal RV, normal PASP  - Low TSH, normal free T4 and T3. Does not appear to be contributing to her edema  - on lasix gtt 12mg /hr, diuresed 13.5L. Weight down from 252 to 243. She was 236 lbs on discharge in Aug and dry weight prior to that admission was 228lbs.  Ideally, would diuresis further with Lasix gtt but now creatinine is starting to bump.  She is still volume overloaded.  Will ask Advanced Heart Failure to see. 2. Stasis leg wounds  - wound care consulted, dressing changed  3. Breast CA with bone metastasis  4. Bruno stage III with worseining creatinine  5. COPD per PCP  6. DM type II per PCP  7. Dysuria with foul odor: UA shows UTI - will send off urine culture and treat with Cipro until cultures are back.  She has had both staph  and E Coli in the past sensitive to Cipro. 8. Hypokalemia - replete and recheck BMET in am  Sueanne Margarita, MD  06/23/2014  8:56 AM

## 2014-06-24 LAB — BASIC METABOLIC PANEL
ANION GAP: 17 — AB (ref 5–15)
Anion gap: 15 (ref 5–15)
BUN: 55 mg/dL — AB (ref 6–23)
BUN: 53 mg/dL — ABNORMAL HIGH (ref 6–23)
CALCIUM: 8.2 mg/dL — AB (ref 8.4–10.5)
CHLORIDE: 89 meq/L — AB (ref 96–112)
CO2: 30 mEq/L (ref 19–32)
CO2: 31 meq/L (ref 19–32)
CREATININE: 1.94 mg/dL — AB (ref 0.50–1.10)
CREATININE: 1.8 mg/dL — AB (ref 0.50–1.10)
Calcium: 8.7 mg/dL (ref 8.4–10.5)
Chloride: 92 mEq/L — ABNORMAL LOW (ref 96–112)
GFR calc Af Amer: 32 mL/min — ABNORMAL LOW (ref >90)
GFR calc non Af Amer: 28 mL/min — ABNORMAL LOW (ref >90)
GFR, EST AFRICAN AMERICAN: 30 mL/min — AB (ref >90)
GFR, EST NON AFRICAN AMERICAN: 26 mL/min — AB (ref >90)
GLUCOSE: 177 mg/dL — AB (ref 70–99)
Glucose, Bld: 274 mg/dL — ABNORMAL HIGH (ref 70–99)
POTASSIUM: 3.4 meq/L — AB (ref 3.7–5.3)
Potassium: 3.3 mEq/L — ABNORMAL LOW (ref 3.7–5.3)
Sodium: 137 mEq/L (ref 137–147)
Sodium: 137 mEq/L (ref 137–147)

## 2014-06-24 LAB — URINE CULTURE: Colony Count: >=100000

## 2014-06-24 LAB — MRSA PCR SCREENING: MRSA by PCR: NEGATIVE

## 2014-06-24 LAB — CARBOXYHEMOGLOBIN
Carboxyhemoglobin: 1.6 % — ABNORMAL HIGH (ref 0.5–1.5)
Methemoglobin: 0.8 % (ref 0.0–1.5)
O2 SAT: 68.2 %
Total hemoglobin: 8.3 g/dL — ABNORMAL LOW (ref 12.0–16.0)

## 2014-06-24 MED ORDER — POTASSIUM CHLORIDE CRYS ER 20 MEQ PO TBCR
40.0000 meq | EXTENDED_RELEASE_TABLET | Freq: Once | ORAL | Status: AC
  Administered 2014-06-24: 40 meq via ORAL

## 2014-06-24 MED ORDER — SODIUM CHLORIDE 0.9 % IJ SOLN
10.0000 mL | Freq: Two times a day (BID) | INTRAMUSCULAR | Status: DC
  Administered 2014-06-24 – 2014-06-27 (×3): 10 mL

## 2014-06-24 MED ORDER — METOLAZONE 5 MG PO TABS
5.0000 mg | ORAL_TABLET | Freq: Two times a day (BID) | ORAL | Status: DC
  Administered 2014-06-24 – 2014-06-26 (×5): 5 mg via ORAL
  Filled 2014-06-24 (×7): qty 1

## 2014-06-24 MED ORDER — PNEUMOCOCCAL VAC POLYVALENT 25 MCG/0.5ML IJ INJ
0.5000 mL | INJECTION | INTRAMUSCULAR | Status: DC
  Filled 2014-06-24: qty 0.5

## 2014-06-24 MED ORDER — SODIUM CHLORIDE 0.9 % IJ SOLN
10.0000 mL | INTRAMUSCULAR | Status: DC | PRN
  Administered 2014-06-27 – 2014-06-28 (×2): 20 mL
  Filled 2014-06-24 (×2): qty 40

## 2014-06-24 NOTE — Telephone Encounter (Signed)
Pt. Having picc line inserted per Dr. Haroldine Laws

## 2014-06-24 NOTE — Consult Note (Signed)
WOC follow-up:  Bilat leg wraps changed. Pt states she wears compression wraps to decrease edema and previously had open wounds and drainage to these sites. They are changed 3 times a week when at home.  Wound type: Currently, there are no open wounds when the wraps were removed.  Wound bed: Dry, peeling and flaking skin  Drainage (amount, consistency, odor) No odor or drainage  Dressing procedure/placement/frequency: Applied compression as previous plan of care with kerlex and coban from behind toes to below knees. Plan to change on Mon if still in the hospital at that time. Pt can resume previous plan of care after discharge.  Please re-consult if further assistance is needed. Thank-you,  Julien Girt MSN, [place], [place], [place], [place]

## 2014-06-24 NOTE — Progress Notes (Addendum)
Advanced Heart Failure Rounding Note  Primary Physician: [name], DO  Primary Cardiologist: Dr Percival Spanish  Subjective:    The patient is a 68 year old female, followed by Dr. Percival Spanish with multiple medical problems, which include a h/o CAD s/p NSTEMI in 2014, COPD, obesity, HTN, DM, diastolic HF and breast cancer with bone metastasis.    She has been admitted multiple times (5 in the past 6 months) for A/C HF. Presented to the ED on 06/18/14 for volume overload and increased SOB and DOE. Attempts were made to manage her as OP but failed.  She states she drinks a lot of water and ice at home and is afraid to take extra torsemide due to worrying about "drying my kidneys out."   Remains on lasix gtt 12 mg/hr and gave metolazone 5 mg once. Weight is up 2 lbs and 24 hr I/O -1.5 liters. Renal function stable. Denies CP. SOB with minimal exertion.    Objective:   Weight Range:  Vital Signs:   Temp:  [97.5 F (36.4 C)-98.3 F (36.8 C)] 98.3 F (36.8 C) (10/28 2039) Pulse Rate:  [80-108] 82 (10/29 0621) Resp:  [18-20] 18 (10/29 0621) BP: (104-109)/(40-49) 106/47 mmHg (10/29 0621) SpO2:  [92 %-100 %] 98 % (10/29 0621) Weight:  [245 lb 4.8 oz (111.267 kg)] 245 lb 4.8 oz (111.267 kg) (10/29 0623) Last BM Date: 06/22/14  Weight change: Filed Weights   06/22/14 0412 06/23/14 0444 06/24/14 0623  Weight: 243 lb (110.224 kg) 243 lb 11.2 oz (110.542 kg) 245 lb 4.8 oz (111.267 kg)    Intake/Output:   Intake/Output Summary (Last 24 hours) at 06/24/14 0818 Last data filed at 06/24/14 5465  Gross per 24 hour  Intake    732 ml  Output   1925 ml  Net  -1193 ml     Physical Exam: General:  Chronically ill appearing. No resp difficulty, sitting up in chair, v HEENT: normal Neck: supple. JVP difficult to assess d/t body habitus but appears to at least jaw. Carotids 2+ bilat; no bruits. No lymphadenopathy or thryomegaly appreciated. Cor: PMI nondisplaced. Regular rate & rhythm. No rubs,  gallops or murmurs. Lungs: clear with decreased BS throughout Abdomen:  Obese soft, nontender, +distended. No hepatosplenomegaly. No bruits or masses. Good bowel sounds. Extremities: no cyanosis, clubbing, rash, bilateral 2- 3+ edema with venous stasis ulcers Neuro: alert & orientedx3, cranial nerves grossly intact. moves all 4 extremities w/o difficulty. Affect pleasant  Telemetry: SR 80s  Labs: Basic Metabolic Panel:  Recent Labs Lab 06/21/14 0418 06/22/14 1020 06/23/14 0521 06/23/14 1600 06/24/14 0425  NA 138 138 138 137 137  K 4.1 2.7* 3.0* 3.1* 3.4*  CL 95* 89* 90* 90* 92*  CO2 26 34* 31 33* 30  GLUCOSE 180* 268* 181* 212* 177*  BUN 41* 47* 53* 54* 55*  CREATININE 1.88* 2.09* 2.08* 1.94* 1.94*  CALCIUM 8.7 8.7 8.2* 8.4 8.2*    Liver Function Tests:  Recent Labs Lab 06/18/14 1921  AST 22  ALT 24  ALKPHOS 105  BILITOT <0.2*  PROT 6.9  ALBUMIN 3.4*   No results found for this basename: LIPASE, AMYLASE,  in the last 168 hours No results found for this basename: AMMONIA,  in the last 168 hours  CBC:  Recent Labs Lab 06/18/14 1921  WBC 4.5  NEUTROABS 3.8  HGB 8.5*  HCT 27.0*  MCV 94.1  PLT 250    Cardiac Enzymes: No results found for this basename: CKTOTAL, CKMB, CKMBINDEX, TROPONINI,  in  the last 168 hours  BNP: BNP (last 3 results)  Recent Labs  04/01/14 2214 04/04/14 0411 06/18/14 1921  PROBNP 588.7* 328.1* 612.6*     Other results:  EKG:   Imaging: No results found.   Medications:     Scheduled Medications: . aspirin EC  81 mg Oral Daily  . atorvastatin  40 mg Oral Daily  . ciprofloxacin  250 mg Oral BID  . clotrimazole  1 application Topical Daily  . docusate sodium  100 mg Oral BID  . heparin subcutaneous  5,000 Units Subcutaneous 3 times per day  . Influenza vac split quadrivalent PF  0.5 mL Intramuscular Tomorrow-1000  . isosorbide mononitrate  60 mg Oral Daily  . letrozole  2.5 mg Oral Daily  . metolazone  2.5 mg Oral  BID  . mirtazapine  15 mg Oral QHS  . palbociclib  125 mg Oral Daily  . pneumococcal 23 valent vaccine  0.5 mL Intramuscular Tomorrow-1000  . polyethylene glycol  17 g Oral Daily  . potassium chloride  40 mEq Oral TID  . tiotropium  18 mcg Inhalation Daily    Infusions: . furosemide (LASIX) infusion 12 mg/hr (06/24/14 0653)    PRN Medications: albuterol   Assessment/Plan/Discussion   1. Acute on chronic diastolic HF  - Presented with increased weight gain which she reported 15-20 lbs, increased DOE, + LE edema and orthopnea. Likely due to dietary indiscretion and drinking more than 2L a day. 01/2014 echo LVEF 55%, grade 1 DD  RV ?OK but difficult images.  - Last weight at discharge was 236 lbs, however she reports her dry weight is 220 lbs.  - She remains volume overloaded. Will increase lasix gtt 20 mg hr and add metolazone 5 mg BID. - Check BMET this afternoon.  2. Stasis leg wounds  - wound care consulted, dressing changed. They are using kerlex and coban.  3. Breast CA with bone metastasis  - Met with palliative care on previous admission and wants to remain FULL CODE.  4. CKD stage III-IV: - Baseline creatinine difficult to judge has been anywhere from 1.4-2.2. Will continue to follow.  5. Hypokalemia - Supplemented and continue potassium 40 meq TID. As above check BMET later.  Will consult CR for ambulation and education. Will place orders for home health.   Length of Stay: 6 Rande Brunt NP-C 06/24/2014, 8:18 AM  Advanced Heart Failure Team Pager [phone] (M-F; 7a - 4p)  Please contact [facility] for night-coverage after hours (4p -7a ) and weekends on [email or web address]  Patient seen and examined with Junie Bame, NP. We discussed all aspects of the encounter. I agree with the assessment and plan as stated above.   Patient seen with NP, agree with the above note.  She is above baseline weight.  It is very difficult to assess her volume status.  She has significant  lower extremity edema but how much is CHF and how much is venous stasis?  JVP very difficult.  - Agree with increasing diuresis today as above given stable creatinine. - Will place PICC and transfer to step-down to follow CVP for more objective measure of volume.   Loralie Champagne 06/24/2014 8:40 AM  CVP 11, some central volume overload, continue diuresis but follow CVPs.   Loralie Champagne 06/24/2014 6:59 PM

## 2014-06-24 NOTE — Telephone Encounter (Signed)
Please call.

## 2014-06-24 NOTE — Progress Notes (Signed)
Peripherally Inserted Central Catheter/Midline Placement  The IV Nurse has discussed with the patient and/or persons authorized to consent for the patient, the purpose of this procedure and the potential benefits and risks involved with this procedure.  The benefits include less needle sticks, lab draws from the catheter and patient may be discharged home with the catheter.  Risks include, but not limited to, infection, bleeding, blood clot (thrombus formation), and puncture of an artery; nerve damage and irregular heat beat.  Alternatives to this procedure were also discussed.  PICC/Midline Placement Documentation        Anita [name] 06/24/2014, 3:44 PM

## 2014-06-24 NOTE — Progress Notes (Signed)
Pt not compliant with fluid restrict, husband keeps bringing in outside beverages for pt to drink.

## 2014-06-25 DIAGNOSIS — E876 Hypokalemia: Secondary | ICD-10-CM

## 2014-06-25 LAB — BASIC METABOLIC PANEL
ANION GAP: 16 — AB (ref 5–15)
ANION GAP: 15 (ref 5–15)
BUN: 54 mg/dL — ABNORMAL HIGH (ref 6–23)
BUN: 55 mg/dL — ABNORMAL HIGH (ref 6–23)
CALCIUM: 8.5 mg/dL (ref 8.4–10.5)
CHLORIDE: 90 meq/L — AB (ref 96–112)
CHLORIDE: 88 meq/L — AB (ref 96–112)
CO2: 33 meq/L — AB (ref 19–32)
CO2: 34 meq/L — AB (ref 19–32)
CREATININE: 1.93 mg/dL — AB (ref 0.50–1.10)
Calcium: 8.3 mg/dL — ABNORMAL LOW (ref 8.4–10.5)
Creatinine, Ser: 1.9 mg/dL — ABNORMAL HIGH (ref 0.50–1.10)
GFR calc Af Amer: 30 mL/min — ABNORMAL LOW (ref >90)
GFR calc non Af Amer: 26 mL/min — ABNORMAL LOW (ref >90)
GFR calc non Af Amer: 26 mL/min — ABNORMAL LOW (ref >90)
GFR, EST AFRICAN AMERICAN: 30 mL/min — AB (ref >90)
Glucose, Bld: 197 mg/dL — ABNORMAL HIGH (ref 70–99)
Glucose, Bld: 239 mg/dL — ABNORMAL HIGH (ref 70–99)
Potassium: 3.1 mEq/L — ABNORMAL LOW (ref 3.7–5.3)
Potassium: 3.2 mEq/L — ABNORMAL LOW (ref 3.7–5.3)
SODIUM: 137 meq/L (ref 137–147)
Sodium: 139 mEq/L (ref 137–147)

## 2014-06-25 LAB — GLUCOSE, CAPILLARY
GLUCOSE-CAPILLARY: 202 mg/dL — AB (ref 70–99)
GLUCOSE-CAPILLARY: 199 mg/dL — AB (ref 70–99)
Glucose-Capillary: 178 mg/dL — ABNORMAL HIGH (ref 70–99)
Glucose-Capillary: 242 mg/dL — ABNORMAL HIGH (ref 70–99)

## 2014-06-25 MED ORDER — POTASSIUM CHLORIDE CRYS ER 20 MEQ PO TBCR
40.0000 meq | EXTENDED_RELEASE_TABLET | Freq: Once | ORAL | Status: AC
  Administered 2014-06-25: 40 meq via ORAL

## 2014-06-25 MED ORDER — INSULIN ASPART 100 UNIT/ML ~~LOC~~ SOLN
0.0000 [IU] | Freq: Every day | SUBCUTANEOUS | Status: DC
Start: 2014-06-25 — End: 2014-06-28
  Administered 2014-06-25: 2 [IU] via SUBCUTANEOUS
  Administered 2014-06-26: 3 [IU] via SUBCUTANEOUS
  Administered 2014-06-27: 4 [IU] via SUBCUTANEOUS

## 2014-06-25 MED ORDER — INSULIN ASPART 100 UNIT/ML ~~LOC~~ SOLN
0.0000 [IU] | Freq: Three times a day (TID) | SUBCUTANEOUS | Status: DC
  Administered 2014-06-25: 3 [IU] via SUBCUTANEOUS
  Administered 2014-06-25 – 2014-06-26 (×3): 2 [IU] via SUBCUTANEOUS
  Administered 2014-06-26 (×2): 3 [IU] via SUBCUTANEOUS
  Administered 2014-06-27: 2 [IU] via SUBCUTANEOUS
  Administered 2014-06-27 – 2014-06-28 (×5): 3 [IU] via SUBCUTANEOUS

## 2014-06-25 NOTE — Progress Notes (Signed)
CARE MANAGEMENT NOTE 06/25/2014  Patient:  Anita [name], Anita [name]   Account Number:  [id number]  Date Initiated:  06/22/2014  Documentation initiated by:  [facility]  Subjective/Objective Assessment:   68 y.o. female with a history of CAD and CHF. She has noticed a weight gain recently and feels that she may be at as much as 15-20 pounds.//Home with spouse.     Action/Plan:   Admit, IV Lasix drip.//Access for disposition needs.   Anticipated DC Date:  06/26/2014   Anticipated DC Plan:  [place]  CM consult      [facility] Choice  HOME HEALTH  Resumption Of Svcs/PTA Provider   Choice offered to / List presented to:          [facility] arranged  HH-1 RN  [place] agency  [place]   Status of service:  Completed, signed off Medicare Important Message given?  YES (If response is "NO", the following Medicare IM given date fields will be blank) Date Medicare IM given:  06/21/2014 Medicare IM given by:  [facility] Date Additional Medicare IM given:  06/25/2014 Additional Medicare IM given by:  Jonnie Finner  Discharge Disposition:  [place]  Per UR Regulation:  Reviewed for med. necessity/level of care/duration of stay  If discussed at [place] of Stay Meetings, dates discussed:    Comments:  06/25/2014 1500 NCM spoke to pt and states she has oxygen with [employer]. Instructed pt to bring portable at dc to go home. Will fax [facility] dc summary at dc. Waiting final recommendations for home. Jonnie Finner RN CCM Case Mgmt phone [phone]    06/22/14 Fuller Mandril, RN, BSN, NCM [phone] On lasix gtt 12mg /hr, diuresed 11L. Weight down from 252 to 243. She was 236 lbs on d/c in August.  06/21/14 Fuller Mandril, RN, BSN, [place] [phone] Pt currently active with [facility] for RN services.  Resumption of care requested.  Tye [place] of [facility] notified.  No  DME needs identified at this time.

## 2014-06-25 NOTE — Progress Notes (Addendum)
CARDIAC REHAB PHASE I   PRE:  Rate/Rhythm: 88 SR  BP:  Supine: 101/47  Sitting:   Standing:    SaO2: 100%RA  MODE:  Ambulation: 170 ft   POST:  Rate/Rhythm:   BP:  Supine: 125/36  Sitting:   Standing:    SaO2: 100% 3L 1345-1430 Pt walked 170 ft on 3L with rolling walker and asst x2.  Pt stated she does not use walker at home but will use wheel chair for long distances. Tired by end of walk with some SOB. Encouraged pt to rest during walk to catch her breath. To recliner with chair alarm. Call bell in reach. Gave pt CHF booklet which she stated she had seen before. Pt could recall when to call MD re weight gain and 2L fluid restriction but did not remember 2000 mg sodium restriction. Gave low sodium diets. Pt stated she uses 3L at home for walking.   Graylon Good, RN BSN  06/25/2014 2:27 PM

## 2014-06-25 NOTE — Progress Notes (Addendum)
Advanced Heart Failure Rounding Note  Primary Physician: [name], DO  Primary Cardiologist: Dr Percival Spanish  Subjective:    The patient is a 68 year old female, followed by Dr. Percival Spanish with multiple medical problems, which include a h/o CAD s/p NSTEMI in 2014, COPD, obesity, HTN, DM, diastolic HF and breast cancer with bone metastasis.    She has been admitted multiple times (5 in the past 6 months) for A/C HF. Presented to the ED on 06/18/14 for volume overload and increased SOB and DOE. Attempts were made to manage her as OP but failed.  She states she drinks a lot of water and ice at home and is afraid to take extra torsemide due to worrying about "drying my kidneys out."   Transferred to stepdown yesterday for CVPs and co-ox in order to get better picture of volume status and output. Co-ox 68%. 24 hr I/O -1.9 liters. Still on lasix gtt 12 mg/hr and metolazone 5 mg BID. Denies SOB or CP. Weight up 4 lbs. Patient still not being compliant with fluid restrictions. CVP 5. Urine very clear.    Objective:   Weight Range:  Vital Signs:   Temp:  [97.9 F (36.6 C)-98.3 F (36.8 C)] 97.9 F (36.6 C) (10/29 2312) Pulse Rate:  [78-102] 81 (10/29 2312) Resp:  [18-21] 18 (10/29 2312) BP: (102-129)/(37-58) 126/37 mmHg (10/29 2312) SpO2:  [94 %-100 %] 100 % (10/29 2312) Weight:  [249 lb 1.9 oz (113 kg)] 249 lb 1.9 oz (113 kg) (10/29 1800) Last BM Date: 06/24/14  Weight change: Filed Weights   06/23/14 0444 06/24/14 0623 06/24/14 1800  Weight: 243 lb 11.2 oz (110.542 kg) 245 lb 4.8 oz (111.267 kg) 249 lb 1.9 oz (113 kg)    Intake/Output:   Intake/Output Summary (Last 24 hours) at 06/25/14 0719 Last data filed at 06/25/14 0600  Gross per 24 hour  Intake 1877.87 ml  Output   3800 ml  Net -1922.13 ml     Physical Exam: General:  Chronically ill appearing. No resp difficulty, sitting on side of bed HEENT: normal Neck: supple. JVP difficult to assess d/t body habitus but appears flat  Carotids 2+ bilat; no bruits. No lymphadenopathy or thryomegaly appreciated. Cor: PMI nondisplaced. Regular rate & rhythm. No rubs, gallops or murmurs. Lungs: clear with decreased BS throughout Abdomen:  Obese soft, nontender, mildly distended. No hepatosplenomegaly. No bruits or masses. Good bowel sounds. Extremities: no cyanosis, clubbing, rash, bilateral woody edema with venous stasis ulcers, R PICC placed Neuro: alert & orientedx3, cranial nerves grossly intact. moves all 4 extremities w/o difficulty. Affect pleasant  Telemetry: SR 80s  Labs: Basic Metabolic Panel:  Recent Labs Lab 06/22/14 1020 06/23/14 0521 06/23/14 1600 06/24/14 0425 06/24/14 1300  NA 138 138 137 137 137  K 2.7* 3.0* 3.1* 3.4* 3.3*  CL 89* 90* 90* 92* 89*  CO2 34* 31 33* 30 31  GLUCOSE 268* 181* 212* 177* 274*  BUN 47* 53* 54* 55* 53*  CREATININE 2.09* 2.08* 1.94* 1.94* 1.80*  CALCIUM 8.7 8.2* 8.4 8.2* 8.7    Liver Function Tests:  Recent Labs Lab 06/18/14 1921  AST 22  ALT 24  ALKPHOS 105  BILITOT <0.2*  PROT 6.9  ALBUMIN 3.4*   No results found for this basename: LIPASE, AMYLASE,  in the last 168 hours No results found for this basename: AMMONIA,  in the last 168 hours  CBC:  Recent Labs Lab 06/18/14 1921  WBC 4.5  NEUTROABS 3.8  HGB 8.5*  HCT  27.0*  MCV 94.1  PLT 250    Cardiac Enzymes: No results found for this basename: CKTOTAL, CKMB, CKMBINDEX, TROPONINI,  in the last 168 hours  BNP: BNP (last 3 results)  Recent Labs  04/01/14 2214 04/04/14 0411 06/18/14 1921  PROBNP 588.7* 328.1* 612.6*     Other results:  EKG:   Imaging: No results found.   Medications:     Scheduled Medications: . aspirin EC  81 mg Oral Daily  . atorvastatin  40 mg Oral Daily  . ciprofloxacin  250 mg Oral BID  . clotrimazole  1 application Topical Daily  . docusate sodium  100 mg Oral BID  . heparin subcutaneous  5,000 Units Subcutaneous 3 times per day  . isosorbide mononitrate   60 mg Oral Daily  . letrozole  2.5 mg Oral Daily  . metolazone  5 mg Oral BID  . mirtazapine  15 mg Oral QHS  . palbociclib  125 mg Oral Daily  . pneumococcal 23 valent vaccine  0.5 mL Intramuscular Tomorrow-1000  . polyethylene glycol  17 g Oral Daily  . potassium chloride  40 mEq Oral TID  . sodium chloride  10-40 mL Intracatheter Q12H  . tiotropium  18 mcg Inhalation Daily    Infusions: . furosemide (LASIX) infusion 20 mg/hr (06/24/14 1700)    PRN Medications: albuterol, sodium chloride   Assessment/Plan/Discussion   1. Acute on chronic diastolic HF  - Presented with increased weight gain which she reported 15-20 lbs, increased DOE, + LE edema and orthopnea. Likely due to dietary indiscretion and drinking more than 2L a day. 01/2014 echo LVEF 55%, grade 1 DD  RV ?OK but difficult images.  - Last weight at discharge was 236 lbs, however she reports her dry weight is 220 lbs.  - Transferred to stepdown yesterday in order to monitor CVPs and co-oxs. PICC placed. CVp 5 and co-ox 68% - Difficult situation because urine is still very clear as if she has fluid on board. Will continue lasix gtt until Dr. Haroldine Laws see's patient. May transition to PO diuretics today.  - Renal function pending.    2. Stasis leg wounds  - wound care consulted, dressing changed. They are using kerlex and coban.  3. Breast CA with bone metastasis  - Met with palliative care on previous admission and wants to remain FULL CODE.  4. CKD stage III-IV: - Baseline creatinine difficult to judge has been anywhere from 1.4-2.2. Will continue to follow. Stable currently.  5. Hypokalemia - Supplemented and will continue 40 meq TID.   Will add sliding scale for blood sugars and encouraged patient to get OOB and ambulate today.   Length of Stay: 7 Rande Brunt NP-C 06/25/2014, 7:19 AM  Advanced Heart Failure Team Pager [phone] (M-F; 7a - 4p)  Please contact [facility] for night-coverage after hours (4p  -7a ) and weekends on [email or web address]  Patient seen and examined with Junie Bame, NP. We discussed all aspects of the encounter. I agree with the assessment and plan as stated above.   She is much improved. I measured CVP personally and closer to 10. Will continue IV diuresis at least one more day. Supp potassium. Recheck BMET at 3pm. If renal function worsening can stop IV lasix.   Hopefully can transition to home po diuretics tomorrow. Long talk about need for fluid restriction and to use metolazone 2.5 prn at home for weight gain > 3 pounds from baseline.   Cardiac rehab consult.   Quillian Quince  Terryon Pineiro,MD 8:24 AM

## 2014-06-26 DIAGNOSIS — N179 Acute kidney failure, unspecified: Secondary | ICD-10-CM

## 2014-06-26 DIAGNOSIS — N189 Chronic kidney disease, unspecified: Secondary | ICD-10-CM

## 2014-06-26 LAB — BASIC METABOLIC PANEL
Anion gap: 18 — ABNORMAL HIGH (ref 5–15)
BUN: 57 mg/dL — ABNORMAL HIGH (ref 6–23)
CALCIUM: 8.2 mg/dL — AB (ref 8.4–10.5)
CO2: 32 mEq/L (ref 19–32)
CREATININE: 2.17 mg/dL — AB (ref 0.50–1.10)
Chloride: 86 mEq/L — ABNORMAL LOW (ref 96–112)
GFR calc non Af Amer: 22 mL/min — ABNORMAL LOW (ref >90)
GFR, EST AFRICAN AMERICAN: 26 mL/min — AB (ref >90)
Glucose, Bld: 235 mg/dL — ABNORMAL HIGH (ref 70–99)
Potassium: 3 mEq/L — ABNORMAL LOW (ref 3.7–5.3)
Sodium: 136 mEq/L — ABNORMAL LOW (ref 137–147)

## 2014-06-26 LAB — GLUCOSE, CAPILLARY
GLUCOSE-CAPILLARY: 224 mg/dL — AB (ref 70–99)
GLUCOSE-CAPILLARY: 282 mg/dL — AB (ref 70–99)
Glucose-Capillary: 198 mg/dL — ABNORMAL HIGH (ref 70–99)

## 2014-06-26 NOTE — Progress Notes (Signed)
CARDIAC REHAB PHASE I   PRE:  Rate/Rhythm: 101 ST    BP: sitting 103/59    SaO2: 98 RA  MODE:  Ambulation: 340 ft   POST:  Rate/Rhythm: 123 ST    BP: sitting 113/52     SaO2: 99 4L  Pt ambulated with RW, 4L, assist x2. Pt significantly DOE however denies this. Sts she feels great and that she is not SOB. HR elevated. Several rests when we asked her to, brief. SaO2 stable. To recliner, pt anxious to d/c tomorrow.  Salley, Bell, ACSM 06/26/2014 3:13 PM

## 2014-06-26 NOTE — Progress Notes (Signed)
Advanced Heart Failure Rounding Note  Primary Physician: [name], DO  Primary Cardiologist: Dr Percival Spanish  Subjective:    The patient is a 68 year old female, followed by Dr. Percival Spanish with multiple medical problems, which include a h/o CAD s/p NSTEMI in 2014, COPD, obesity, HTN, DM, diastolic HF and breast cancer with bone metastasis.    She has been admitted multiple times (5 in the past 6 months) for A/C HF. Presented to the ED on 06/18/14 for volume overload and increased SOB and DOE. Attempts were made to manage her as OP but failed.  She states she drinks a lot of water and ice at home and is afraid to take extra torsemide due to worrying about "drying my kidneys out."   Remains on lasix gtt 20 mg/hr and metolazone 5 mg BID. Denies SOB or CP. Weight down 12 pounds since admit. Cr up to 2.1. K 3.0. CVP 2   Objective:   Weight Range:  Vital Signs:   Temp:  [97.6 F (36.4 C)-98.1 F (36.7 C)] 97.9 F (36.6 C) (10/31 0700) Pulse Rate:  [72-94] 94 (10/31 0900) Resp:  [16-21] 18 (10/31 0819) BP: (101-137)/(37-56) 133/43 mmHg (10/31 0900) SpO2:  [92 %-100 %] 96 % (10/31 0900) Weight:  [108 kg (238 lb 1.6 oz)] 108 kg (238 lb 1.6 oz) (10/31 0428) Last BM Date: 06/24/14  Weight change: Filed Weights   06/24/14 0623 06/24/14 1800 06/26/14 0428  Weight: 111.267 kg (245 lb 4.8 oz) 113 kg (249 lb 1.9 oz) 108 kg (238 lb 1.6 oz)    Intake/Output:   Intake/Output Summary (Last 24 hours) at 06/26/14 1110 Last data filed at 06/26/14 0930  Gross per 24 hour  Intake    780 ml  Output   4450 ml  Net  -3670 ml     Physical Exam: CVP 2 General:  Chronically ill appearing. No resp difficulty, sitting on side of bed HEENT: normal Neck: supple. JVP difficult to assess d/t body habitus but appears flat Carotids 2+ bilat; no bruits. No lymphadenopathy or thryomegaly appreciated. Cor: PMI nondisplaced. Regular rate & rhythm. No rubs, gallops or murmurs. Lungs: clear with decreased BS  throughout Abdomen:  Obese soft, nontender, mildly distended. No hepatosplenomegaly. No bruits or masses. Good bowel sounds. Extremities: no cyanosis, clubbing, rash, no significant edema. Chronic venous stasis changes with venous stasis ulcers, R PICC placed Neuro: alert & orientedx3, cranial nerves grossly intact. moves all 4 extremities w/o difficulty. Affect pleasant  Telemetry: SR 80s  Labs: Basic Metabolic Panel:  Recent Labs Lab 06/24/14 0425 06/24/14 1300 06/25/14 0633 06/25/14 1759 06/26/14 0500  NA 137 137 139 137 136*  K 3.4* 3.3* 3.1* 3.2* 3.0*  CL 92* 89* 90* 88* 86*  CO2 30 31 33* 34* 32  GLUCOSE 177* 274* 197* 239* 235*  BUN 55* 53* 54* 55* 57*  CREATININE 1.94* 1.80* 1.90* 1.93* 2.17*  CALCIUM 8.2* 8.7 8.3* 8.5 8.2*    Liver Function Tests: No results found for this basename: AST, ALT, ALKPHOS, BILITOT, PROT, ALBUMIN,  in the last 168 hours No results found for this basename: LIPASE, AMYLASE,  in the last 168 hours No results found for this basename: AMMONIA,  in the last 168 hours  CBC: No results found for this basename: WBC, NEUTROABS, HGB, HCT, MCV, PLT,  in the last 168 hours  Cardiac Enzymes: No results found for this basename: CKTOTAL, CKMB, CKMBINDEX, TROPONINI,  in the last 168 hours  BNP: BNP (last 3 results)  Recent  Labs  04/01/14 2214 04/04/14 0411 06/18/14 1921  PROBNP 588.7* 328.1* 612.6*     Other results:    Imaging: No results found.   Medications:     Scheduled Medications: . aspirin EC  81 mg Oral Daily  . atorvastatin  40 mg Oral Daily  . clotrimazole  1 application Topical Daily  . docusate sodium  100 mg Oral BID  . heparin subcutaneous  5,000 Units Subcutaneous 3 times per day  . insulin aspart  0-5 Units Subcutaneous QHS  . insulin aspart  0-9 Units Subcutaneous TID WC  . isosorbide mononitrate  60 mg Oral Daily  . letrozole  2.5 mg Oral Daily  . metolazone  5 mg Oral BID  . mirtazapine  15 mg Oral QHS  .  palbociclib  125 mg Oral Daily  . pneumococcal 23 valent vaccine  0.5 mL Intramuscular Tomorrow-1000  . polyethylene glycol  17 g Oral Daily  . potassium chloride  40 mEq Oral TID  . sodium chloride  10-40 mL Intracatheter Q12H  . tiotropium  18 mcg Inhalation Daily    Infusions: . furosemide (LASIX) infusion 20 mg/hr (06/26/14 0004)    PRN Medications: albuterol, sodium chloride   Assessment/Plan/Discussion   1. Acute on chronic diastolic HF  - Presented with increased weight gain which she reported 15-20 lbs,Likely due to dietary indiscretion and drinking more than 2L a day. 01/2014 echo LVEF 55%, grade 1 DD  RV ?OK but difficult images.  - Last weight at discharge was 236 lbs, however she reports her dry weight is 220 lbs. Weight today 238 - CVP down and renal function worse. She is dry.  2. Stasis leg wounds  - wound care consulted, dressing changed. They are using kerlex and coban.  3. Breast CA with bone metastasis  - Met with palliative care on previous admission and wants to remain FULL CODE.  4. Acute CKD stage III-IV: - She is dry stop lasix. Switch to po in am  5. Hypokalemia - Will supplement.   Long talk about need for fluid restriction. Stop IV lasix today. If renal function stable may be suitable for d/c in am on torsemide 80/40  +  metolazone 2.5 prn at home for weight gain > 3 pounds from baseline. F/u in HF clinic  Can go to tele.   Length of Stay: 8 Glori Bickers MD  06/26/2014, 11:10 AM  Advanced Heart Failure Team Pager [phone] (M-F; [place])  Please contact [facility] for night-coverage after hours (4p -7a ) and weekends on [email or web address]

## 2014-06-27 DIAGNOSIS — N179 Acute kidney failure, unspecified: Secondary | ICD-10-CM

## 2014-06-27 DIAGNOSIS — E876 Hypokalemia: Secondary | ICD-10-CM

## 2014-06-27 LAB — GLUCOSE, CAPILLARY
GLUCOSE-CAPILLARY: 237 mg/dL — AB (ref 70–99)
Glucose-Capillary: 229 mg/dL — ABNORMAL HIGH (ref 70–99)
Glucose-Capillary: 189 mg/dL — ABNORMAL HIGH (ref 70–99)
Glucose-Capillary: 302 mg/dL — ABNORMAL HIGH (ref 70–99)

## 2014-06-27 LAB — BASIC METABOLIC PANEL
ANION GAP: 16 — AB (ref 5–15)
BUN: 72 mg/dL — ABNORMAL HIGH (ref 6–23)
CHLORIDE: 89 meq/L — AB (ref 96–112)
CO2: 32 meq/L (ref 19–32)
Calcium: 8.7 mg/dL (ref 8.4–10.5)
Creatinine, Ser: 2.66 mg/dL — ABNORMAL HIGH (ref 0.50–1.10)
GFR calc non Af Amer: 17 mL/min — ABNORMAL LOW (ref >90)
GFR, EST AFRICAN AMERICAN: 20 mL/min — AB (ref >90)
Glucose, Bld: 207 mg/dL — ABNORMAL HIGH (ref 70–99)
POTASSIUM: 3.5 meq/L — AB (ref 3.7–5.3)
SODIUM: 137 meq/L (ref 137–147)

## 2014-06-27 NOTE — Progress Notes (Signed)
       Patient Name: Anita [name] Date of Encounter: 06/27/2014    SUBJECTIVE: Breathing better. Denies orthostatic dizziness. Feels real thirsty. Demand indigo home later today.  TELEMETRY:  Normal sinus rhythm Filed Vitals:   06/26/14 1100 06/26/14 1300 06/26/14 2044 06/27/14 0500  BP: 122/52 108/52 109/57 119/55  Pulse: 98 102 98 106  Temp: 97.9 F (36.6 C) 98.3 F (36.8 C) 99.6 F (37.6 C) 98.6 F (37 C)  TempSrc: Oral Oral Oral Oral  Resp: 18 18 20 20   Height:  5\' 2"  (1.575 m)    Weight:  238 lb 8.6 oz (108.2 kg)  240 lb 1.3 oz (108.9 kg)  SpO2: 95% 96% 100% 97%    Intake/Output Summary (Last 24 hours) at 06/27/14 0734 Last data filed at 06/27/14 0600  Gross per 24 hour  Intake    980 ml  Output   1551 ml  Net   -571 ml   LABS: Basic Metabolic Panel:  Recent Labs  06/26/14 0500 06/27/14 0517  NA 136* 137  K 3.0* 3.5*  CL 86* 89*  CO2 32 32  GLUCOSE 235* 207*  BUN 57* 72*  CREATININE 2.17* 2.66*  CALCIUM 8.2* 8.7     Radiology/Studies:  No new data  Physical Exam: Blood pressure 119/55, pulse 106, temperature 98.6 F (37 C), temperature source Oral, resp. rate 20, height 5\' 2"  (1.575 m), weight 240 lb 1.3 oz (108.9 kg), SpO2 97 %. Weight change: 7.1 oz (0.2 kg)  Wt Readings from Last 3 Encounters:  06/27/14 240 lb 1.3 oz (108.9 kg)  06/08/14 250 lb 9.6 oz (113.671 kg)  06/04/14 248 lb (112.492 kg)    Morbidly obese female sitting at bedside. In no distress. Distant breath sounds. No murmur or gallop on exam. Lichenification of lower extremities without significant edema.  ASSESSMENT:  1. Acute on chronic kidney injury related to diuresis 2. Acute on chronic diastolic heart failure, improved with diuresis 3. Morbid obesity 4. Diabetes mellitus  Plan:  1. Hold diuresis today. If she demands to go home today, I will resume her usual outpatient diuretic regimen. I would advocate against discharge until we can resume diuretic therapy as  follow-up may be difficult. 2. Monitor electrolytes 3. For the time being I will discontinue potassium  Signed, Sinclair Grooms 06/27/2014, 7:34 AM

## 2014-06-27 NOTE — Plan of Care (Signed)
Problem: Phase III Progression Outcomes Goal: Pain controlled on oral analgesia Outcome: Completed/Met Date Met:  06/27/14     

## 2014-06-28 ENCOUNTER — Encounter (HOSPITAL_COMMUNITY): Payer: Self-pay

## 2014-06-28 LAB — BASIC METABOLIC PANEL
Anion gap: 15 (ref 5–15)
BUN: 71 mg/dL — ABNORMAL HIGH (ref 6–23)
CHLORIDE: 89 meq/L — AB (ref 96–112)
CO2: 30 mEq/L (ref 19–32)
Calcium: 8.4 mg/dL (ref 8.4–10.5)
Creatinine, Ser: 2.41 mg/dL — ABNORMAL HIGH (ref 0.50–1.10)
GFR calc Af Amer: 23 mL/min — ABNORMAL LOW (ref >90)
GFR, EST NON AFRICAN AMERICAN: 20 mL/min — AB (ref >90)
GLUCOSE: 232 mg/dL — AB (ref 70–99)
POTASSIUM: 2.9 meq/L — AB (ref 3.7–5.3)
Sodium: 134 mEq/L — ABNORMAL LOW (ref 137–147)

## 2014-06-28 LAB — GLUCOSE, CAPILLARY
GLUCOSE-CAPILLARY: 212 mg/dL — AB (ref 70–99)
Glucose-Capillary: 213 mg/dL — ABNORMAL HIGH (ref 70–99)
Glucose-Capillary: 230 mg/dL — ABNORMAL HIGH (ref 70–99)

## 2014-06-28 MED ORDER — POTASSIUM CHLORIDE CRYS ER 20 MEQ PO TBCR: 40.0000 meq | EXTENDED_RELEASE_TABLET | Freq: Once | ORAL | Status: DC

## 2014-06-28 MED ORDER — PNEUMOCOCCAL VAC POLYVALENT 25 MCG/0.5ML IJ INJ
0.5000 mL | INJECTION | Freq: Once | INTRAMUSCULAR | Status: DC
  Filled 2014-06-28: qty 0.5

## 2014-06-28 MED ORDER — TORSEMIDE 20 MG PO TABS
40.0000 mg | ORAL_TABLET | Freq: Every evening | ORAL | Status: DC
  Administered 2014-06-28: 40 mg via ORAL
  Filled 2014-06-28: qty 2

## 2014-06-28 MED ORDER — POTASSIUM CHLORIDE CRYS ER 20 MEQ PO TBCR
40.0000 meq | EXTENDED_RELEASE_TABLET | Freq: Once | ORAL | Status: AC
  Administered 2014-06-28: 40 meq via ORAL
  Filled 2014-06-28: qty 2

## 2014-06-28 MED ORDER — POTASSIUM CHLORIDE CRYS ER 20 MEQ PO TBCR: 20.0000 meq | EXTENDED_RELEASE_TABLET | Freq: Every day | ORAL | Status: DC

## 2014-06-28 MED ORDER — TORSEMIDE 20 MG PO TABS
80.0000 mg | ORAL_TABLET | Freq: Every day | ORAL | Status: DC
  Administered 2014-06-28: 80 mg via ORAL
  Filled 2014-06-28 (×2): qty 4

## 2014-06-28 NOTE — Progress Notes (Signed)
CRITICAL VALUE ALERT  Critical value received:  Potassium = 2.9  Date of notification:  06/28/2014  Time of notification:  0559  Critical value read back:Yes.    Nurse who received alert:  Shellee Milo, RN  MD notified (1st page):  Dr. Vertell Limber  Time of first page:  0604  MD Responded: [phone]  Awaiting new orders. RN will continue to monitor. Shellee Milo, RN

## 2014-06-28 NOTE — Progress Notes (Signed)
Heart Failure Navigator Consult Note  Presentation: Anita [name] is a 68 y.o. female with a history of CAD and CHF. She has noticed a weight gain recently and feels that she may be at as much as 15-20 pounds. She is on oxygen chronically, but has noticed an increase in her dyspnea on exertion. Even on oxygen, she cannot walk across a room without stopping for breath. She has not had chest pain. She admits to lower extremity edema, orthopnea and possibly PND. Attempts have been made to manage her medically as an outpatient. However, they have not been successful. Finally, she agreed to come in for IV diuresis. An admission was scheduled for today and she is here for admission.  She is significantly short of breath at rest. She has significant orthopnea. She has lower extremity edema, but the extent is difficult to adjust to this she has Unna boots in place on both lower extremities. She has several chronic medical issues but no new acute medical issues.   Past Medical History  Diagnosis Date  . Fibroid   . Morbid obesity   . 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 COPD (chronic obstructive pulmonary disease)   . Chronic diastolic CHF (congestive heart failure)   . NSTEMI (non-ST elevated myocardial infarction)     Cath November 2014 LAD 40% stenosis, first diagonal 80% stenosis, circumflex 20% stenosis, right coronary artery occluded. The EF was 35-40% at that time.  . Anemia     a. Adm 09/2012 with melena, Hgb 5.8 -> transfused. EGD/colonoscopy unrevealing.  . Breast cancer     a. Mets to bone. ER 100%/ PR 0%/Her2 neu negative.  [name] Ulcers of both lower legs   . Tobacco abuse   . Hyperlipidemia 02/11/2013  . Chronic respiratory failure     a. On O2 qhs. also portable O2.  . Chronic renal insufficiency   . Lesion of vocal cord     a. CT 05/2013 concerning for tumor.  . Shortness of breath   . Depression   . Diabetes mellitus without complication   .  Anginal pain   . Dementia     very mild  . Radiation 02/02/14-02/24/14    left and right femur 30 gray    History   Social History  . Marital Status: Widowed    Spouse Name: N/A    Number of Children: 2  . Years of Education: N/A   Occupational History  . retired Academic librarian    Social History Main Topics  . Smoking status: Former Smoker -- 2.00 packs/day for 40 years    Types: Cigarettes    Quit date: 09/24/2012  . Smokeless tobacco: Never Used  . Alcohol Use: No  . Drug Use: No  . Sexual Activity: No   Other Topics Concern  . None   Social History Narrative   Lives with husband Anita [name] [phone]; Daughter who would like to make medical decisions is Anita [name] [phone]. Patient in school at [employer] for counseling degree.     ECHO:Study Conclusions--02/04/14  - Left ventricle: The cavity size was normal. Systolic function was normal. The estimated ejection fraction was 55%. Images were inadequate for LV wall motion assessment. - Ventricular septum: Septal motion showed paradox. These changes are consistent with intraventricular conduction delay. - Pulmonic valve: There was trivial regurgitation.  Transthoracic echocardiography. M-mode, complete 2D, spectral Doppler, and color Doppler. Birthdate: Patient birthdate: 03-25-1946. Age:  Patient is 67 yr old. Sex: Gender: female. Height: Height: 157.5 cm. Height: 62 in. Weight: Weight: 101.9 kg. Weight: 224.2 lb. Body mass index: BMI: 41.1 kg/m^2. Body surface area:  BSA: 2.17 m^2. Blood pressure:   106/50 Patient status: Inpatient. Study date: Study date: 02/04/2014. Study time: 09:47 AM. Location: Echo laboratory BNP    Component Value Date/Time   PROBNP 612.6* 06/18/2014 1921    Education Assessment and Provision:  Detailed education and instructions provided on heart failure disease management including the following:  Signs and symptoms of Heart Failure When to call the  physician Importance of daily weights Low sodium diet Fluid restriction Medication management Anticipated future follow-up appointments  Patient education [name].  Patient acknowledges understanding and acceptance of all instructions.  I spoke briefly with [name] regarding HF and recommendations--due to the fact that she was very lethargic.  I explained again that she is to take [place] when her weight increases 3 pounds.  She says that she does have a scale and does weigh daily.  She does have [place] through [facility] and they should be able to assist her with symptom recognition.  She says that she eats low sodium--I reviewed high sodium foods to avoid.  I also reviewed fluid intake and limitation with her.  She acknowledges that she has had education prior and was able to teach back some information.  She says that she is fine now that "fluid is gone".  I reminded her that our goal is to keep "the fluid off" and that she has to help in that by complying with HF recommendations and coming to scheduled appts.  Education Materials:  "Living Better With Heart Failure" Booklet, Daily Weight Tracker Tool.   High Risk Criteria for Readmission and/or Poor Patient Outcomes:  (Recommend Follow-up with Advanced Heart Failure Clinic)--Yes    EF less than 30%- No  2 or more admissions in 6 months-  Yes  Difficult social situation- No--lives with husband  Demonstrates medication noncompliance- No?    Barriers of Care:  Knowledge and compliance  Discharge Planning:   Plans to discharge to home with husband.  She has a follow-up appt with AHF clinic on 07/05/14.  She has had [facility] and will resume that at discharge.

## 2014-06-28 NOTE — Progress Notes (Signed)
Inpatient Diabetes Program Recommendations  AACE/ADA: New Consensus Statement on Inpatient Glycemic Control (2013)  Target Ranges:  Prepandial:   less than 140 mg/dL      Peak postprandial:   less than 180 mg/dL (1-2 hours)      Critically ill patients:  140 - 180 mg/dL    Inpatient Diabetes Program Recommendations Insulin - Basal: Please consider adding lantus at 10-15 units daily or HS HgbA1C: Glucose running high consistently in 200's using sensitive correction tid and HS scale-Please check HgbA1C  Thank you, Rosita Kea, RN, CNS, Diabetes Coordinator [phone])

## 2014-06-28 NOTE — Discharge Summary (Signed)
Advanced Heart Failure Team  Discharge Summary   Patient ID: Anita [name] MRN: [id number], DOB/AGE: 01-15-1946 68 y.o. Admit date: 06/18/2014 D/C date:     06/28/2014   Primary Discharge Diagnoses:  1. Acute on chronic diastolic HF  - - 09/5655 echo LVEF 55%, grade 1 DD RV ?OK but difficult images.  2. Stasis leg wounds  - wound care consulted, dressing changed. [place] to continue dressing changes.   3. Breast CA with bone metastasis  - Met with palliative care on previous admission and wants to remain FULL CODE.  4. 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 Hypokalemia   Hospital Course:  Mrs Bellmore is a 68 year old, followed by Dr Antoine Poche, with history of CAD s/p NSTEMI in 2014, COPD, obesity, HTN, DM, diastolic HF and breast cancer with bone metastasis.  She has been admitted multiple times (5 in the past 6 months) for A/C HF. She presented to the ED 06/18/14 for volume overload and increased SOB and DOE. Attempts were made to manage her as an outpatient however failed. Central access was placed to further assist with volume status. She was diuresed with Lasix drip + metolazone. As she improved she was transitioned back to her home regimen torsemide 80 mg in am and 40 mg in pm with 20 meq of potassium. Overall she diuresed 9 pounds.  Will need to continue watch renal function closely. Creatinine on discharge was 2.4 and we will check next week.   She received extensive education on limiting fluid intake to < 2 liters per day and low salt food choices. She will continue to be followed closely in the HF clinic and has an appointment November 9th. Plan to check BMET at that time. She will [facility] to be [facility].   Discharge Weight Range: Discharge Weight 241 pounds.  Discharge Vitals: Blood pressure 119/58, pulse 79, temperature 97.7 F (36.5 C), temperature source Oral, resp. rate 18, height 5\' 2"  (1.575 m), weight 241 lb 6.5 oz (109.5 kg), SpO2 98  %.  Labs: Lab Results  Component Value Date   WBC 4.5 06/18/2014   HGB 8.5* 06/18/2014   HCT 27.0* 06/18/2014   MCV 94.1 06/18/2014   PLT 250 06/18/2014    Recent Labs Lab 06/28/14 0415  NA 134*  K 2.9*  CL 89*  CO2 30  BUN 71*  CREATININE 2.41*  CALCIUM 8.4  GLUCOSE 232*   Lab Results  Component Value Date   CHOL 177 03/14/2013   HDL 61 03/14/2013   LDLCALC 96 03/14/2013   TRIG 100 03/14/2013   BNP (last 3 results)  Recent Labs  04/01/14 2214 04/04/14 0411 06/18/14 1921  PROBNP 588.7* 328.1* 612.6*    Diagnostic Studies/Procedures   No results found.  Discharge Medications     Medication List    STOP taking these medications        albuterol 108 (90 BASE) MCG/ACT inhaler  Commonly known as:  PROVENTIL HFA;VENTOLIN HFA      TAKE these medications        aspirin 81 MG EC tablet  Take 1 tablet (81 mg total) by mouth daily.     atorvastatin 40 MG tablet  Commonly known as:  LIPITOR  Take 40 mg by mouth daily.     clotrimazole 1 % cream  Commonly known as:  LOTRIMIN  Apply 1 application topically daily.     IBRANCE 125 MG capsule  Generic drug:  palbociclib  Take  125 mg by mouth See admin instructions. Take 150m every morning for 2 weeks, hold for a week, then repeat. Take whole with food.     isosorbide mononitrate 60 MG 24 hr tablet  Commonly known as:  IMDUR  Take 1 tablet (60 mg total) by mouth daily.     letrozole 2.5 MG tablet  Commonly known as:  FEMARA  Take 1 tablet (2.5 mg total) by mouth daily.     mirtazapine 15 MG tablet  Commonly known as:  REMERON  Take 1 tablet (15 mg total) by mouth at bedtime.     nitroGLYCERIN 0.4 MG SL tablet  Commonly known as:  NITROSTAT  Place 1 tablet (0.4 mg total) under the tongue every 5 (five) minutes as needed for chest pain.     NON FORMULARY  Place 3 L into the nose as needed (oxygen).     potassium chloride SA 20 MEQ tablet  Commonly known as:  K-DUR,KLOR-CON  Take 1 tablet (20  mEq total) by mouth daily.     tiotropium 18 MCG inhalation capsule  Commonly known as:  SPIRIVA  Place 1 capsule (18 mcg total) into inhaler and inhale daily.     torsemide 20 MG tablet  Commonly known as:  DEMADEX  Take 40-80 mg by mouth 2 (two) times daily. 891min the morning and 4076mn the evening     VITAMIN D (CHOLECALCIFEROL) PO  Take 1,000 mg by mouth daily.        Disposition   The patient will be discharged in stable condition to home.     Discharge Instructions    Contraindication to ACEI at discharge    Complete by:  As directed      Diet - low sodium heart healthy    Complete by:  As directed      Heart Failure patients record your daily weight using the same scale at the same time of day    Complete by:  As directed      Increase activity slowly    Complete by:  As directed           Follow-up Information    Follow up with DalLoralie ChampagneD On 07/05/2014.   Specialty:  Cardiology   Why:  at 10:30 Garage Code 5000   Contact information:   [address] [phone]      Duration of Discharge Encounter: Greater than 35 minutes   Signed, CLEGG,[name] NP-C  06/28/2014, 12:20 PM

## 2014-06-28 NOTE — Progress Notes (Signed)
Patient is good spirits this am.  She again confirmed her home management plan and has expressed no further concerns.  Will continue to monitor patient for additional needs.  Of note, [facility] Care Management services does not replace or interfere with any services that are arranged by inpatient case management or social work.  For additional questions or referrals please contact Corliss Blacker BSN RN [facility] Liaison at [phone].

## 2014-06-28 NOTE — Consult Note (Signed)
WOC follow-up: Bilat leg wraps changed.  Wound type: Currently, there are no open wounds when the wraps were removed.  Wound bed: Dry, peeling and flaking skin.  Some patchy areas of partia thickness skin loss where wraps have slipped down on legs and patient scratched until there was a small amt bleeding. Drainage (amount, consistency, odor) No odor or drainage  Dressing procedure/placement/frequency: Applied compression as previous plan of care with kerlex and coban from behind toes to below knees. Plan to change on Thurs. if still in the hospital at that time. Pt can resume previous plan of care after discharge.  Please re-consult if further assistance is needed. Thank-you,  Julien Girt MSN, [place], [place], [place], [place]

## 2014-06-28 NOTE — Progress Notes (Signed)
Patient ID: Anita [name], female   DOB: 24-Mar-1946, 68 y.o.   MRN: [id number] Advanced Heart Failure Rounding Note  Primary Physician: [name], DO  Primary Cardiologist: Dr. Percival Spanish  Subjective:    The patient is a 68 year old female, followed by Dr. Percival Spanish with multiple medical problems, which include a h/o CAD s/p NSTEMI in 2014, COPD, obesity, HTN, DM, diastolic HF and breast cancer with bone metastasis.    She has been admitted multiple times (5 in the past 6 months) for A/C HF. Presented to the ED on 06/18/14 for volume overload and increased SOB and DOE. Attempts were made to manage her as OP but failed.  She states she drinks a lot of water and ice at home and is afraid to take extra torsemide due to worrying about "drying my kidneys out."   Diuretics held yesterday with rise in creatinine.  Creatinine lower today.  She feels good, wants to go home.  Objective:   Weight Range:  Vital Signs:   Temp:  [97.7 F (36.5 C)-98.7 F (37.1 C)] 97.7 F (36.5 C) (11/02 0523) Pulse Rate:  [79-107] 80 (11/02 0523) Resp:  [18-19] 18 (11/02 0523) BP: (97-120)/(39-52) 120/51 mmHg (11/02 0523) SpO2:  [97 %-100 %] 98 % (11/02 0523) Weight:  [241 lb 6.5 oz (109.5 kg)] 241 lb 6.5 oz (109.5 kg) (11/02 0523) Last BM Date: 06/27/14  Weight change: Filed Weights   06/26/14 1300 06/27/14 0500 06/28/14 0523  Weight: 238 lb 8.6 oz (108.2 kg) 240 lb 1.3 oz (108.9 kg) 241 lb 6.5 oz (109.5 kg)    Intake/Output:   Intake/Output Summary (Last 24 hours) at 06/28/14 0747 Last data filed at 06/28/14 0527  Gross per 24 hour  Intake   1200 ml  Output   2002 ml  Net   -802 ml     Physical Exam: CVP 2 General:  Chronically ill appearing. No resp difficulty, sitting on side of bed HEENT: normal Neck: supple. JVP difficult to assess d/t body habitus but not significantly elevated.  Carotids 2+ bilat; no bruits. No lymphadenopathy or thryomegaly appreciated. Cor: PMI nondisplaced. Regular rate  & rhythm. No rubs, gallops or murmurs. Lungs: clear with decreased BS throughout Abdomen:  Obese soft, nontender, mildly distended. No hepatosplenomegaly. No bruits or masses. Good bowel sounds. Extremities: no cyanosis, clubbing, rash, no significant edema. Chronic venous stasis changes with venous stasis ulcers, R PICC placed Neuro: alert & orientedx3, cranial nerves grossly intact. moves all 4 extremities w/o difficulty. Affect pleasant  Telemetry: SR 80s  Labs: Basic Metabolic Panel:  Recent Labs Lab 06/25/14 0633 06/25/14 1759 06/26/14 0500 06/27/14 0517 06/28/14 0415  NA 139 137 136* 137 134*  K 3.1* 3.2* 3.0* 3.5* 2.9*  CL 90* 88* 86* 89* 89*  CO2 33* 34* 32 32 30  GLUCOSE 197* 239* 235* 207* 232*  BUN 54* 55* 57* 72* 71*  CREATININE 1.90* 1.93* 2.17* 2.66* 2.41*  CALCIUM 8.3* 8.5 8.2* 8.7 8.4    Liver Function Tests: No results for input(s): AST, ALT, ALKPHOS, BILITOT, PROT, ALBUMIN in the last 168 hours. No results for input(s): LIPASE, AMYLASE in the last 168 hours. No results for input(s): AMMONIA in the last 168 hours.  CBC: No results for input(s): WBC, NEUTROABS, HGB, HCT, MCV, PLT in the last 168 hours.  Cardiac Enzymes: No results for input(s): CKTOTAL, CKMB, CKMBINDEX, TROPONINI in the last 168 hours.  BNP: BNP (last 3 results)  Recent Labs  04/01/14 2214 04/04/14 0411 06/18/14 1921  PROBNP 588.7* 328.1* 612.6*     Other results:    Imaging: No results found.   Medications:     Scheduled Medications: . aspirin EC  81 mg Oral Daily  . atorvastatin  40 mg Oral Daily  . clotrimazole  1 application Topical Daily  . docusate sodium  100 mg Oral BID  . heparin subcutaneous  5,000 Units Subcutaneous 3 times per day  . insulin aspart  0-5 Units Subcutaneous QHS  . insulin aspart  0-9 Units Subcutaneous TID WC  . isosorbide mononitrate  60 mg Oral Daily  . letrozole  2.5 mg Oral Daily  . mirtazapine  15 mg Oral QHS  . palbociclib  125  mg Oral Daily  . pneumococcal 23 valent vaccine  0.5 mL Intramuscular Tomorrow-1000  . polyethylene glycol  17 g Oral Daily  . potassium chloride  40 mEq Oral Once  . potassium chloride  40 mEq Oral Once  . sodium chloride  10-40 mL Intracatheter Q12H  . tiotropium  18 mcg Inhalation Daily    Infusions:    PRN Medications: albuterol, sodium chloride   Assessment/Plan/Discussion   1. Acute on chronic diastolic HF  - Presented with increased weight gain which she reported 15-20 lbs,Likely due to dietary indiscretion and drinking more than 2L a day. - 01/2014 echo LVEF 55%, grade 1 DD  RV ?OK but difficult images.  - Creatinine better today, will start torsemide 80 qam/40 qpm.  She will take metolazone 2.5 prn weight gain of 3 lbs from baseline at home.  2. Stasis leg wounds  - wound care consulted, dressing changed. They are using kerlex and coban.  3. Breast CA with bone metastasis  - Met with palliative care on previous admission and wants to remain FULL CODE.  4. 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  5. Hypokalemia: Will supplement.  6. Disposition: May go home today.  Her home diuretic regimen will be torsemide 80 qam/40 qpm with metolazone 2.5 prn 3 lb weight gain from baseline. She will get KCl 20 daily.   Length of Stay: 10 Loralie Champagne MD  06/28/2014, 7:47 AM  Advanced Heart Failure Team Pager [phone] (M-F; 7a - 4p)  Please contact [facility] for night-coverage after hours (4p -7a ) and weekends on [email or web address]

## 2014-07-02 ENCOUNTER — Other Ambulatory Visit: Payer: Self-pay | Admitting: Family Medicine

## 2014-07-02 NOTE — Telephone Encounter (Signed)
Pt called and would like a refill on her Lotrimin and Torsemide. jw

## 2014-07-05 ENCOUNTER — Encounter (HOSPITAL_COMMUNITY): Payer: Medicare HMO

## 2014-07-05 MED ORDER — TORSEMIDE 20 MG PO TABS: 40.0000 mg | ORAL_TABLET | Freq: Two times a day (BID) | ORAL | Status: DC

## 2014-07-05 MED ORDER — CLOTRIMAZOLE 1 % EX CREA: 1.0000 "application " | TOPICAL_CREAM | Freq: Every day | CUTANEOUS | Status: DC

## 2014-07-05 NOTE — Telephone Encounter (Signed)
LM informing that rx has been sent in by Dr. Lacinda Axon

## 2014-07-06 ENCOUNTER — Telehealth: Payer: Self-pay | Admitting: Hematology

## 2014-07-06 ENCOUNTER — Telehealth: Payer: Self-pay | Admitting: Cardiology

## 2014-07-06 MED ORDER — TORSEMIDE 20 MG PO TABS: 40.0000 mg | ORAL_TABLET | Freq: Two times a day (BID) | ORAL | Status: DC

## 2014-07-06 NOTE — Telephone Encounter (Signed)
Pt called to r/s can't do Thursday only Wed, pt kept changing her mind I finally just gave her a date and pt confirmed..... KJ

## 2014-07-06 NOTE — Telephone Encounter (Signed)
Rx refill sent to patient pharmacy   

## 2014-07-06 NOTE — Telephone Encounter (Signed)
Pt need a new prescription for Toresomide. Please call to Between

## 2014-07-07 ENCOUNTER — Other Ambulatory Visit: Payer: Self-pay

## 2014-07-07 ENCOUNTER — Ambulatory Visit: Payer: Medicare HMO | Admitting: Physician Assistant

## 2014-07-07 ENCOUNTER — Inpatient Hospital Stay (HOSPITAL_COMMUNITY): Payer: Medicare HMO

## 2014-07-07 ENCOUNTER — Encounter (HOSPITAL_COMMUNITY): Payer: Medicare HMO

## 2014-07-07 DIAGNOSIS — C50919 Malignant neoplasm of unspecified site of unspecified female breast: Secondary | ICD-10-CM

## 2014-07-07 DIAGNOSIS — C7951 Secondary malignant neoplasm of bone: Secondary | ICD-10-CM

## 2014-07-07 MED ORDER — MIRTAZAPINE 15 MG PO TABS: 15.0000 mg | ORAL_TABLET | Freq: Every day | ORAL | Status: DC

## 2014-07-07 MED ORDER — LETROZOLE 2.5 MG PO TABS: 2.5000 mg | ORAL_TABLET | Freq: Every day | ORAL | Status: DC

## 2014-07-07 NOTE — Telephone Encounter (Signed)
Pt called for remeron and letrozole refills. S/w [place] and her Rx has no refills on it for her letrozole. Dr Lona Kettle has signed refill request for remeron. Refill for letrozole done e-scribe. LVM rx were sent

## 2014-07-08 ENCOUNTER — Encounter (HOSPITAL_COMMUNITY): Payer: Medicare HMO

## 2014-07-12 ENCOUNTER — Ambulatory Visit: Payer: Medicaid Other | Admitting: Radiation Oncology

## 2014-07-15 ENCOUNTER — Telehealth: Payer: Self-pay | Admitting: Hematology

## 2014-07-15 ENCOUNTER — Other Ambulatory Visit: Payer: Commercial Managed Care - HMO

## 2014-07-15 ENCOUNTER — Ambulatory Visit: Payer: Commercial Managed Care - HMO

## 2014-07-15 NOTE — Telephone Encounter (Signed)
returned pt call and lvm that MD did not have available appt at 11:30 or 12....lvm for pt to call back if she still needed to r/s

## 2014-07-16 ENCOUNTER — Telehealth: Payer: Self-pay | Admitting: Hematology

## 2014-07-16 NOTE — Telephone Encounter (Signed)
pt called to confirm appts...done

## 2014-07-21 ENCOUNTER — Encounter: Payer: Self-pay | Admitting: Hematology

## 2014-07-21 ENCOUNTER — Encounter: Payer: Self-pay | Admitting: Physician Assistant

## 2014-07-21 ENCOUNTER — Ambulatory Visit (HOSPITAL_BASED_OUTPATIENT_CLINIC_OR_DEPARTMENT_OTHER): Payer: Medicare HMO

## 2014-07-21 ENCOUNTER — Ambulatory Visit (INDEPENDENT_AMBULATORY_CARE_PROVIDER_SITE_OTHER): Payer: Commercial Managed Care - HMO | Admitting: Physician Assistant

## 2014-07-21 ENCOUNTER — Telehealth: Payer: Self-pay | Admitting: Hematology

## 2014-07-21 ENCOUNTER — Ambulatory Visit (HOSPITAL_BASED_OUTPATIENT_CLINIC_OR_DEPARTMENT_OTHER): Payer: Medicare HMO | Admitting: Hematology

## 2014-07-21 ENCOUNTER — Other Ambulatory Visit (HOSPITAL_BASED_OUTPATIENT_CLINIC_OR_DEPARTMENT_OTHER): Payer: Medicare HMO

## 2014-07-21 VITALS — BP 120/64 | HR 91 | Temp 98.2°F | Resp 23 | Ht 62.0 in | Wt 249.9 lb

## 2014-07-21 VITALS — BP 112/58 | HR 96 | Ht 62.0 in | Wt 247.6 lb

## 2014-07-21 DIAGNOSIS — D649 Anemia, unspecified: Secondary | ICD-10-CM

## 2014-07-21 DIAGNOSIS — N183 Chronic kidney disease, stage 3 unspecified: Secondary | ICD-10-CM

## 2014-07-21 DIAGNOSIS — I5033 Acute on chronic diastolic (congestive) heart failure: Secondary | ICD-10-CM

## 2014-07-21 DIAGNOSIS — D72819 Decreased white blood cell count, unspecified: Secondary | ICD-10-CM

## 2014-07-21 DIAGNOSIS — C50411 Malignant neoplasm of upper-outer quadrant of right female breast: Secondary | ICD-10-CM

## 2014-07-21 DIAGNOSIS — N189 Chronic kidney disease, unspecified: Secondary | ICD-10-CM

## 2014-07-21 DIAGNOSIS — C7951 Secondary malignant neoplasm of bone: Secondary | ICD-10-CM

## 2014-07-21 DIAGNOSIS — Z79899 Other long term (current) drug therapy: Secondary | ICD-10-CM

## 2014-07-21 DIAGNOSIS — C7800 Secondary malignant neoplasm of unspecified lung: Secondary | ICD-10-CM

## 2014-07-21 DIAGNOSIS — I2510 Atherosclerotic heart disease of native coronary artery without angina pectoris: Secondary | ICD-10-CM

## 2014-07-21 DIAGNOSIS — C50919 Malignant neoplasm of unspecified site of unspecified female breast: Secondary | ICD-10-CM

## 2014-07-21 DIAGNOSIS — I2583 Coronary atherosclerosis due to lipid rich plaque: Secondary | ICD-10-CM

## 2014-07-21 LAB — CBC WITH DIFFERENTIAL/PLATELET
BASO%: 0.4 % (ref 0.0–2.0)
Basophils Absolute: 0 10*3/uL (ref 0.0–0.1)
EOS%: 2.2 % (ref 0.0–7.0)
Eosinophils Absolute: 0.1 10*3/uL (ref 0.0–0.5)
HEMATOCRIT: 25.7 % — AB (ref 34.8–46.6)
HEMOGLOBIN: 8.3 g/dL — AB (ref 11.6–15.9)
LYMPH#: 0.3 10*3/uL — AB (ref 0.9–3.3)
LYMPH%: 9.7 % — ABNORMAL LOW (ref 14.0–49.7)
MCH: 29.6 pg (ref 25.1–34.0)
MCHC: 32.3 g/dL (ref 31.5–36.0)
MCV: 91.8 fL (ref 79.5–101.0)
MONO#: 0.1 10*3/uL (ref 0.1–0.9)
MONO%: 4.5 % (ref 0.0–14.0)
NEUT#: 2.2 10*3/uL (ref 1.5–6.5)
NEUT%: 83.2 % — AB (ref 38.4–76.8)
Platelets: 168 10*3/uL (ref 145–400)
RBC: 2.8 10*6/uL — ABNORMAL LOW (ref 3.70–5.45)
RDW: 17.5 % — ABNORMAL HIGH (ref 11.2–14.5)
WBC: 2.7 10*3/uL — ABNORMAL LOW (ref 3.9–10.3)

## 2014-07-21 LAB — COMPREHENSIVE METABOLIC PANEL (CC13)
ALT: 17 U/L (ref 0–55)
ANION GAP: 11 meq/L (ref 3–11)
AST: 13 U/L (ref 5–34)
Albumin: 3.3 g/dL — ABNORMAL LOW (ref 3.5–5.0)
Alkaline Phosphatase: 85 U/L (ref 40–150)
BUN: 42.9 mg/dL — ABNORMAL HIGH (ref 7.0–26.0)
CHLORIDE: 106 meq/L (ref 98–109)
CO2: 24 mEq/L (ref 22–29)
Calcium: 8.9 mg/dL (ref 8.4–10.4)
Creatinine: 1.8 mg/dL — ABNORMAL HIGH (ref 0.6–1.1)
Glucose: 164 mg/dl — ABNORMAL HIGH (ref 70–140)
Potassium: 3.5 mEq/L (ref 3.5–5.1)
Sodium: 142 mEq/L (ref 136–145)
Total Bilirubin: 0.45 mg/dL (ref 0.20–1.20)
Total Protein: 6.5 g/dL (ref 6.4–8.3)

## 2014-07-21 LAB — PHOSPHORUS: Phosphorus: 2.9 mg/dL (ref 2.3–4.6)

## 2014-07-21 LAB — CANCER ANTIGEN 27.29: CA 27.29: 29 U/mL (ref 0–39)

## 2014-07-21 MED ORDER — METOLAZONE 2.5 MG PO TABS: ORAL_TABLET | ORAL | Status: DC

## 2014-07-21 MED ORDER — MIRTAZAPINE 15 MG PO TABS: 15.0000 mg | ORAL_TABLET | Freq: Every day | ORAL | Status: DC

## 2014-07-21 MED ORDER — DENOSUMAB 120 MG/1.7ML ~~LOC~~ SOLN
120.0000 mg | Freq: Once | SUBCUTANEOUS | Status: AC
  Administered 2014-07-21: 120 mg via SUBCUTANEOUS
  Filled 2014-07-21: qty 1.7

## 2014-07-21 NOTE — Telephone Encounter (Signed)
gv and printed appt sched and avs for pt for DEc °

## 2014-07-21 NOTE — Assessment & Plan Note (Signed)
Serum creatinine has improved to 1.8.

## 2014-07-21 NOTE — Patient Instructions (Addendum)
Take Metolazone 2.5mg  every other day x 3 doses.  ( Take 30 minute before the Furosemide).  Restrict your fluid intake to 1 1/2 liters per day.  If your weight increases 3 lbs in a day or 5 lbs in a week please call the office. [phone]  Your physician recommends that you return for lab work in: the day of your next appointment in 2 weeks.  Your physician recommends that you schedule a follow-up appointment in: 2 weeks with an extender or Dr. Percival Spanish.

## 2014-07-21 NOTE — Progress Notes (Signed)
Patient ID: Anita [name], female   DOB: [birth year], 68 y.o.   MRN: [id number]    Date:  07/21/2014   ID:  Anita [name], DOB [birth year], MRN [id number]  PCP:  [name], DO  Primary Cardiologist:  [place]     History of Present Illness: Anita [name] is a 69 y.o. female followed by Dr Percival Spanish, with history of CAD s/p NSTEMI in 2014, COPD, obesity, HTN, DM, diastolic HF and breast cancer with bone metastasis, tobacco abuse quit in 2014.    She has been admitted multiple times (5 in the past 6 months) for A/C HF. She presented to the ED 06/18/14 for volume overload and increased SOB and DOE. Attempts made to manage her as an outpatient failed. Central access was placed to further assist with volume status. She was diuresed with Lasix drip + metolazone. As she improved she was transitioned back to her home regimen torsemide 80 mg in am and 40 mg in pm with 20 meq of potassium. Overall she diuresed 9 pounds.  Creatinine on discharge was 2.4.  Creatinine on 07/20/2014 was 1.8.   She received extensive education on limiting fluid intake to < 2 liters per day and low salt food choices.   She will continue to be followed by [facility]. Patient's last 2-D echocardiogram was June 2015 and her ejection fraction was 55%. Images however, were not adequate for LV wall motion assessment.  She presents today for evaluation. She states her weight is going up and her lower extremities are weeping fluid. Her daughter is a CNA keeps her legs wrapped. She sleeps in a recliner with her legs elevated. She is on chronic oxygen therapy. She is mostly short of breath with ambulation. She denies any paroxysmal nocturnal dyspnea.  Her weight is up 6 pounds from discharge.  She is limiting her fluid intake to 2 L per day and does not put salt on anything. She makes all her own meals.  The patient currently denies nausea, vomiting, fever, chest pain, dizziness, PND, cough, congestion, abdominal pain, hematochezia, melena,  claudication.  Wt Readings from Last 3 Encounters:  07/21/14 247 lb 9.6 oz (112.311 kg)  07/21/14 249 lb 14.4 oz (113.354 kg)  06/28/14 241 lb 6.5 oz (109.5 kg)     Past Medical History  Diagnosis Date  . Fibroid   . Morbid obesity   . 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 COPD (chronic obstructive pulmonary disease)   . Chronic diastolic CHF (congestive heart failure)   . NSTEMI (non-ST elevated myocardial infarction)     Cath November 2014 LAD 40% stenosis, first diagonal 80% stenosis, circumflex 20% stenosis, right coronary artery occluded. The EF was 35-40% at that time.  . Anemia     a. Adm 09/2012 with melena, Hgb 5.8 -> transfused. EGD/colonoscopy unrevealing.  . Breast cancer     a. Mets to bone. ER 100%/ PR 0%/Her2 neu negative.  [name] Ulcers of both lower legs   . Tobacco abuse   . Hyperlipidemia 02/11/2013  . Chronic respiratory failure     a. On O2 qhs. also portable O2.  . Chronic renal insufficiency   . Lesion of vocal cord     a. CT 05/2013 concerning for tumor.  . Shortness of breath   . Depression   . Diabetes mellitus without complication   . Anginal pain   . Dementia     very mild  .  Radiation 02/02/14-02/24/14    left and right femur 30 gray    Current Outpatient Prescriptions  Medication Sig Dispense Refill  . aspirin EC 81 MG EC tablet Take 1 tablet (81 mg total) by mouth daily. 30 tablet 3  . atorvastatin (LIPITOR) 40 MG tablet Take 40 mg by mouth daily.    . clotrimazole (LOTRIMIN) 1 % cream Apply 1 application topically daily. 30 g 1  . isosorbide mononitrate (IMDUR) 60 MG 24 hr tablet Take 1 tablet (60 mg total) by mouth daily. 30 tablet 5  . letrozole (FEMARA) 2.5 MG tablet Take 1 tablet (2.5 mg total) by mouth daily. 90 tablet 0  . mirtazapine (REMERON) 15 MG tablet Take 1 tablet (15 mg total) by mouth at bedtime. 30 tablet 5  . nitroGLYCERIN (NITROSTAT) 0.4 MG SL tablet Place 1 tablet (0.4 mg total) under the  tongue every 5 (five) minutes as needed for chest pain. 30 tablet 0  . NON FORMULARY Place 3 L into the nose as needed (oxygen).     . palbociclib (IBRANCE) 125 MG capsule Take 125 mg by mouth See admin instructions. Take 125mg  every morning for 2 weeks, hold for a week, then repeat. Take whole with food.    . potassium chloride SA (K-DUR,KLOR-CON) 20 MEQ tablet Take 1 tablet (20 mEq total) by mouth daily. 60 tablet 5  . tiotropium (SPIRIVA) 18 MCG inhalation capsule Place 1 capsule (18 mcg total) into inhaler and inhale daily. 30 capsule 6  . torsemide (DEMADEX) 20 MG tablet Take 2-4 tablets (40-80 mg total) by mouth 2 (two) times daily. 80mg  in the morning and 40mg  in the evening 120 tablet 3  . VITAMIN D, CHOLECALCIFEROL, PO Take 1,000 mg by mouth daily.     . metolazone (ZAROXOLYN) 2.5 MG tablet Take one tablet  30 minutes before furosemide every other day x 3 doses. 30 tablet 0   No current facility-administered medications for this visit.    Allergies:    Allergies  Allergen Reactions  . Omnipaque [Iohexol]     Pt claims she developed hives after given contrast  . Benzene Rash    Social History:  The patient  reports that she quit smoking about 21 months ago. Her smoking use included Cigarettes. She has a 80 pack-year smoking history. She has never used smokeless tobacco. She reports that she does not drink alcohol or use illicit drugs.   Family history:   Family History  Problem Relation Age of Onset  . Cancer Mother     leukemia  . Hypertension Mother   . Diabetes Father   . Heart disease Father     passed away due to heart attack    ROS:  Please see the history of present illness.  All other systems reviewed and negative.   PHYSICAL EXAM: VS:  BP 112/58 mmHg  Pulse 96  Ht 5\' 2"  (1.575 m)  Wt 247 lb 9.6 oz (112.311 kg)  BMI 45.28 kg/m2 Morbidly obese, well developed, in no acute distress HEENT: Pupils are equal round react to light accommodation extraocular movements  are intact.  Cardiac: Regular rate and rhythm without murmurs rubs or gallops. Lungs:  clear to auscultation bilaterally, however, breath sounds are decreased particularly on the upper left. no wheezing, rhonchi or rales Abd: soft, nontender, positive bowel sounds  Ext: 2+ lower extremity edema.  Her legs are extensively wrapped for the edema however, her pants legs seem moist which is probably from the weeping  2+  radial  pulses. Skin: warm and dry Neuro:  Grossly normal    ASSESSMENT AND PLAN:  Problem List Items Addressed This Visit    Acute on chronic diastolic heart failure    Patient's weight is up 6 pounds from her discharge. Her legs are clearly swollen despite being wrapped with bandages. I'm going to add metolazone 2.5 mg every other day for the next 6 days. This would be a total of 3 doses. She'll follow up in 2 weeks. I also asked her to limit her fluid intake to 1.5 L.  She is concerned about her kidneys. We'll check a basic metabolic panel at her next office visit.     Relevant Medications      metolazone (ZAROXOLYN) tablet   CAD (coronary artery disease) (Chronic)    Patient denies any chest pain.    Relevant Medications      metolazone (ZAROXOLYN) tablet   Chronic kidney disease, stage III (moderate) (Chronic)    Serum creatinine has improved to 1.8.     Other Visit Diagnoses    Polypharmacy    -  Primary    Relevant Orders       Basic metabolic panel

## 2014-07-21 NOTE — Assessment & Plan Note (Signed)
Patient's weight is up 6 pounds from her discharge. Her legs are clearly swollen despite being wrapped with bandages. I'm going to add metolazone 2.5 mg every other day for the next 6 days. This would be a total of 3 doses. She'll follow up in 2 weeks. I also asked her to limit her fluid intake to 1.5 L.  She is concerned about her kidneys. We'll check a basic metabolic panel at her next office visit.

## 2014-07-21 NOTE — Patient Instructions (Signed)
Denosumab injection What is this medicine? DENOSUMAB (den oh sue mab) slows bone breakdown. Prolia is used to treat osteoporosis in women after menopause and in men. Xgeva is used to prevent bone fractures and other bone problems caused by cancer bone metastases. Xgeva is also used to treat giant cell tumor of the bone. This medicine may be used for other purposes; ask your health care provider or pharmacist if you have questions. COMMON BRAND NAME(S): Prolia, XGEVA What should I tell my health care provider before I take this medicine? They need to know if you have any of these conditions: -dental disease -eczema -infection or history of infections -kidney disease or on dialysis -low blood calcium or vitamin D -malabsorption syndrome -scheduled to have surgery or tooth extraction -taking medicine that contains denosumab -thyroid or parathyroid disease -an unusual reaction to denosumab, other medicines, foods, dyes, or preservatives -pregnant or trying to get pregnant -breast-feeding How should I use this medicine? This medicine is for injection under the skin. It is given by a health care professional in a hospital or clinic setting. If you are getting Prolia, a special MedGuide will be given to you by the pharmacist with each prescription and refill. Be sure to read this information carefully each time. For Prolia, talk to your pediatrician regarding the use of this medicine in children. Special care may be needed. For Xgeva, talk to your pediatrician regarding the use of this medicine in children. While this drug may be prescribed for children as young as 13 years for selected conditions, precautions do apply. Overdosage: If you think you've taken too much of this medicine contact a poison control center or emergency room at once. Overdosage: If you think you have taken too much of this medicine contact a poison control center or emergency room at once. NOTE: This medicine is only for  you. Do not share this medicine with others. What if I miss a dose? It is important not to miss your dose. Call your doctor or health care professional if you are unable to keep an appointment. What may interact with this medicine? Do not take this medicine with any of the following medications: -other medicines containing denosumab This medicine may also interact with the following medications: -medicines that suppress the immune system -medicines that treat cancer -steroid medicines like prednisone or cortisone This list may not describe all possible interactions. Give your health care provider a list of all the medicines, herbs, non-prescription drugs, or dietary supplements you use. Also tell them if you smoke, drink alcohol, or use illegal drugs. Some items may interact with your medicine. What should I watch for while using this medicine? Visit your doctor or health care professional for regular checks on your progress. Your doctor or health care professional may order blood tests and other tests to see how you are doing. Call your doctor or health care professional if you get a cold or other infection while receiving this medicine. Do not treat yourself. This medicine may decrease your body's ability to fight infection. You should make sure you get enough calcium and vitamin D while you are taking this medicine, unless your doctor tells you not to. Discuss the foods you eat and the vitamins you take with your health care professional. See your dentist regularly. Brush and floss your teeth as directed. Before you have any dental work done, tell your dentist you are receiving this medicine. Do not become pregnant while taking this medicine or for 5 months after stopping   it. Women should inform their doctor if they wish to become pregnant or think they might be pregnant. There is a potential for serious side effects to an unborn child. Talk to your health care professional or pharmacist for more  information. What side effects may I notice from receiving this medicine? Side effects that you should report to your doctor or health care professional as soon as possible: -allergic reactions like skin rash, itching or hives, swelling of the face, lips, or tongue -breathing problems -chest pain -fast, irregular heartbeat -feeling faint or lightheaded, falls -fever, chills, or any other sign of infection -muscle spasms, tightening, or twitches -numbness or tingling -skin blisters or bumps, or is dry, peels, or red -slow healing or unexplained pain in the mouth or jaw -unusual bleeding or bruising Side effects that usually do not require medical attention (Report these to your doctor or health care professional if they continue or are bothersome.): -muscle pain -stomach upset, gas This list may not describe all possible side effects. Call your doctor for medical advice about side effects. You may report side effects to FDA at 1-800-FDA-1088. Where should I keep my medicine? This medicine is only given in a clinic, doctor's office, or other health care setting and will not be stored at home. NOTE: This sheet is a summary. It may not cover all possible information. If you have questions about this medicine, talk to your doctor, pharmacist, or health care provider.  2015, Elsevier/Gold Standard. (2012-02-11 12:37:47)  

## 2014-07-21 NOTE — Assessment & Plan Note (Signed)
Patient denies any chest pain

## 2014-07-21 NOTE — Progress Notes (Signed)
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 Biopsy only. Added Palbociclib 125 mg daily for 21 days on and 7 days off on 01/01/2014. Dose modified to 2 weeks on 1 week off with cycle 3.  Adjunctive zometa added on 01/01/2014.  Started palliative XRT to the left and right femur (30 Gy in 10 fractions) on 02/02/2014 per Dr. Gery Pray which was finished on 02/24/2014. Zometa held due to renal insufficiency. I started Xgeva because of skeletal disease progression and safe renal profile.  INTERVAL HISTORY:  Anita [name] 68 y.o. female with a history of right breast cancer with metastases to the bone/lungs here for 1 month followup.  She was last seen by me on 06/08/2014.     Today, she came by herself.   She recovered from her recent right arm pathologic fracture.  She was admitted to family medicine teaching service on 06/22 and discharged on 6/25 with worsening lower extremity edema.  Palliative care was consulted and patient was agreeable to continue radiation therapy and endocrine therapy plus palbociclib for now.  She continues to have a hoarse voice.  She notes some improvement in her generalized bone pain s/p completion of her XRT.   She will have the right splint removed by orthopedics next month. She is on her palbociclib and next week will stop it for 1 week. Her white count was low at 2700 and ANC 2200. Hemoglobin low but stable at 8.3 grams and platelets are 168,000. She is also getting XGEVA today and we discussed the side effects not limited to low calcium, low phosphorous, risk for ONJ.  She had a cat scan done CT chest abdomen and pelvis for re-staging on 06/07/2014 which I reviewed.   CLINICAL  DATA: metastatic breast cancer Breast cancer metastasized to bone, right C50.911, C79.51 (ICD-10-CM). Subsequent encounter. EXAM:  CT CHEST, ABDOMEN AND PELVIS WITHOUT CONTRAST TECHNIQUE: Multidetector CT imaging of the chest, abdomen and pelvis was performed following the standard protocol without IV contrast. COMPARISON: PET 08/10/2013. Chest radiograph of 04/02/2014. Prior diagnostic CTs of 05/13/2013.  FINDINGS: CT CHEST FINDINGS Lungs/Pleura: Mild motion degradation. Subpleural right lower lobe 5 mm nodule on image 22, unchanged. 4 mm left upper lobe nodule on image 24 is similar. The right lower lobe nodule described on the prior exam may be identified at 4 mm on image 22. No pleural fluid.  Heart/Mediastinum: Left thyroid enlargement, similar. No axillary adenopathy. Mild cardiomegaly. Atherosclerosis, including within the coronary arteries. No pericardial effusion. No mediastinal or definite hilar adenopathy, given limitations of unenhanced CT. CT ABDOMEN AND PELVIS FINDINGS Motion degradation continuing into the abdomen and pelvis. Hepatobiliary: Marked hepatic steatosis. Normal gallbladder, without biliary ductal dilatation. Pancreas: Normal, without mass or pancreatic ductal dilatation. Spleen: Normal Urinary tract: Normal adrenal glands. Staghorn left renal calculus. Maximally 3.2 cm. Mild left renal cortical thinning. No gross hydronephrosis. Normal right kidney. No bladder calculi. Stomach/Bowel: Normal stomach, without wall thickening. Normal terminal ileum and appendix. Normal small bowel without abdominal ascites. Vascular/Lymphatic: Aortic and branch vessel atherosclerosis. Preaortic node measures 9 mm on image 64 versus 7 mm on the prior. The left periaortic node measures 1.0 cm and maintains its fatty hilum. Unchanged. Prominent inguinal nodes are similar and likely reactive. Reproductive:  Dystrophic calcifications within the uterus likely relate to underlying fibroids. No adnexal mass.Other:  No evidence of omental or peritoneal disease. No significant free fluid. Musculoskeletal: Widespread osseous metastasis, as evidenced by heterogeneous marrow density. More well-defined lucent lesions today. Example 2.0 cm left acetabular lesion versus 1.1 cm on 05/13/2013. Eccentric right L3 vertebral body lesion on image 68 of series 2.  New lytic lesion in the right humeral head on image 5. Interval healing left-sided rib fractures, likely pathologic.  CT CHEST IMPRESSION 1. Motion degraded exam. 2. No evidence of extraosseous metastatic disease within the chest. 3. Similar nonspecific pulmonary nodules. 4. Progressive osseous metastasis.  CT ABDOMEN AND PELVIS IMPRESSION 1. Motion degraded exam. 2. Slight enlargement of a preaortic node which is indeterminate. Recommend attention on follow-up. 3. Progressive osseous metastasis, including development of multiple lytic lesions and presumably pathologic left rib fractures. The vertebral body lesions could predispose the patient to cord compression. If there is any such symptoms, pre and post contrast spine MRI should be considered. Left acetabular lesion could predispose the patient to pathologic fracture 4. Left sided staghorn calculus. 5. Hepatic steatosis. Electronically Signed By: Abigail Miyamoto M.D. On: 06/07/2014 16:14  We are planning another scan in January 2016 for restaging of her cancer. In the interim, we will continue the current therapy. She will also see her cardiologist today.   MEDICAL HISTORY: Past Medical History  Diagnosis Date  . Fibroid   . Morbid obesity   . 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 COPD (chronic obstructive pulmonary disease)   . Chronic diastolic CHF (congestive heart failure)   . NSTEMI (non-ST elevated myocardial infarction)     Cath November 2014 LAD 40% stenosis, first diagonal 80% stenosis, circumflex 20% stenosis, right coronary artery occluded. The EF was 35-40% at that  time.  . Anemia     a. Adm 09/2012 with melena, Hgb 5.8 -> transfused. EGD/colonoscopy unrevealing.  . Breast cancer     a. Mets to bone. ER 100%/ PR 0%/Her2 neu negative.  [name] Ulcers of both lower legs   . Tobacco abuse   . Hyperlipidemia 02/11/2013  . Chronic respiratory failure     a. On O2 qhs. also portable O2.  . Chronic renal insufficiency   . Lesion of vocal cord     a. CT 05/2013 concerning for tumor.  . Shortness of breath   . Depression   . Diabetes mellitus without complication   . Anginal pain   . Dementia     very mild  . Radiation 02/02/14-02/24/14    left and right femur 30 gray   INTERIM HISTORY: has DM2 (diabetes mellitus, type 2); COPD (chronic obstructive pulmonary disease); Chronic respiratory failure; Chronic combined systolic and diastolic congestive heart failure; CAD (coronary artery disease); Chronic kidney disease, stage III (moderate); Anemia of chronic disease; Breast mass, right; History of tobacco abuse; Breast cancer metastasized to bone; Hyperlipidemia; Lesion of vocal cord; Closed right arm fracture; Lower extremity weakness; Intertrigo; Pressure ulcer stage II; Acute diastolic HF (heart failure); Acute on chronic combined systolic and diastolic congestive heart failure, NYHA class 4; Acute on chronic respiratory failure; Acute on chronic diastolic heart failure; Acute on chronic diastolic ACC/AHA stage C congestive heart failure; UTI (urinary tract infection); Hypokalemia; and Acute on chronic renal failure on her problem list.    ALLERGIES:  is allergic to omnipaque and benzene.  MEDICATIONS: has a current medication list which includes the  following prescription(s): aspirin, atorvastatin, clotrimazole, isosorbide mononitrate, letrozole, mirtazapine, nitroglycerin, NON FORMULARY, palbociclib, potassium chloride sa, tiotropium, torsemide, and cholecalciferol.  SURGICAL HISTORY:  Past Surgical History  Procedure Laterality Date  . Colonoscopy,  esophagogastroduodenoscopy (egd) and esophageal dilation N/A 10/13/2012    Procedure: colonoscopy and egd;  Surgeon: Wonda Horner, MD;  Location: [facility];  Service: Endoscopy;  Laterality: N/A;   REVIEW OF SYSTEMS:   Constitutional: Denies fevers, chills or abnormal weight loss Eyes: Denies blurriness of vision Ears, nose, mouth, throat, and face: Denies mucositis or sore throat Respiratory: Denies cough, dyspnea or wheezes Cardiovascular: Denies palpitation, chest discomfort or lower extremity swelling Gastrointestinal:  Denies nausea, heartburn or change in bowel habits Skin: Denies abnormal skin rashes Lymphatics: Denies new lymphadenopathy or easy bruising Neurological:Denies numbness, tingling or new weaknesses Behavioral/Psych: Mood is stable, no new changes  All other systems were reviewed with the patient and are negative.  PHYSICAL EXAMINATION: ECOG PERFORMANCE STATUS: 1  Blood pressure 120/64, pulse 91, temperature 98.2 F (36.8 C), temperature source Oral, resp. rate 23, height $RemoveBe'5\' 2"'[id number]$  (1.575 m), weight 249 lb 14.4 oz (113.354 kg), SpO2 98 %.  GENERAL:alert, no distress and comfortable morbidly obese woman, chronically ill appearing SKIN: skin color, texture, turgor are normal; posterior legs with irritation and erythema with a small abrasion on the posterior left thigh with mild TTP.  EYES: normal, Conjunctiva are pink and non-injected, sclera clear OROPHARYNX:no exudate, no erythema and lips, buccal mucosa, and tongue normal  NECK: supple, thyroid normal size, non-tender, without nodularity LYMPH:  no palpable lymphadenopathy in the cervical, axillary LUNGS: coarse breathing with slightly increased breathing rate HEART: regular rate & rhythm and no murmurs and 2+ lower extremity edema BREAST: deferred ABDOMEN:abdomen soft, non-tender and normal bowel sounds; obese Musculoskeletal:no cyanosis of digits and no clubbing; R arm in a brace NEURO: alert & oriented x 3 with  fluent speech, no focal motor/sensory deficits  LABORATORY DATA: CBC    Component Value Date/Time   WBC 2.7* 07/21/2014 1300   WBC 4.5 06/18/2014 1921   RBC 2.80* 07/21/2014 1300   RBC 2.87* 06/18/2014 1921   RBC 3.18* 09/26/2012 0518   HGB 8.3* 07/21/2014 1300   HGB 8.5* 06/18/2014 1921   HCT 25.7* 07/21/2014 1300   HCT 27.0* 06/18/2014 1921   PLT 168 07/21/2014 1300   PLT 250 06/18/2014 1921   MCV 91.8 07/21/2014 1300   MCV 94.1 06/18/2014 1921   MCH 29.6 07/21/2014 1300   MCH 29.6 06/18/2014 1921   MCHC 32.3 07/21/2014 1300   MCHC 31.5 06/18/2014 1921   RDW 17.5* 07/21/2014 1300   RDW 18.5* 06/18/2014 1921   LYMPHSABS 0.3* 07/21/2014 1300   LYMPHSABS 0.4* 06/18/2014 1921   MONOABS 0.1 07/21/2014 1300   MONOABS 0.2 06/18/2014 1921   EOSABS 0.1 07/21/2014 1300   EOSABS 0.1 06/18/2014 1921   BASOSABS 0.0 07/21/2014 1300   BASOSABS 0.0 06/18/2014 1921   CMP     Component Value Date/Time   NA 142 07/21/2014 1300   NA 134* 06/28/2014 0415   K 3.5 07/21/2014 1300   K 2.9* 06/28/2014 0415   CL 89* 06/28/2014 0415   CL 101 02/11/2013 0950   CO2 24 07/21/2014 1300   CO2 30 06/28/2014 0415   GLUCOSE 164* 07/21/2014 1300   GLUCOSE 232* 06/28/2014 0415   GLUCOSE 143* 02/11/2013 0950   BUN 42.9* 07/21/2014 1300   BUN 71* 06/28/2014 0415   CREATININE 1.8* 07/21/2014 1300   CREATININE 2.41*  06/28/2014 0415   CREATININE 1.81* 06/04/2014 1549   CALCIUM 8.9 07/21/2014 1300   CALCIUM 8.4 06/28/2014 0415   CALCIUM 6.6* 09/29/2012 1118   PROT 6.5 07/21/2014 1300   PROT 6.9 06/18/2014 1921   ALBUMIN 3.3* 07/21/2014 1300   ALBUMIN 3.4* 06/18/2014 1921   AST 13 07/21/2014 1300   AST 22 06/18/2014 1921   ALT 17 07/21/2014 1300   ALT 24 06/18/2014 1921   ALKPHOS 85 07/21/2014 1300   ALKPHOS 105 06/18/2014 1921   BILITOT 0.45 07/21/2014 1300   BILITOT <0.2* 06/18/2014 1921   GFRNONAA 20* 06/28/2014 0415   GFRAA 23* 06/28/2014 0415   Results for LYAN, HOLCK (MRN  [id number]) as of 03/05/2014 08:44  Ref. Range 12/30/2013 11:33  Iron Latest Range: 42-135 ug/dL 38 (L)  UIBC Latest Range: 125-400 ug/dL 867  TIBC Latest Range: 250-470 ug/dL 737  %SAT Latest Range: 21-57 % 12 (L)  Ferritin Latest Range: 10-291 ng/mL 139    RADIOGRAPHIC STUDIES: CT scan reviewed above  ASSESSMENT: Metastatic breast cancer on palliative letrozole plus Palbociclib and Xgeva for bone mets, with mild progression seen on skeletal system but not visceral disease.   PLAN:  1. Acute right distal oblique fracture (12/31/2013). --Likely secondary to number #3. We  started zometa q 4 weeks (first dose on 01/01/2014) to reduce further skeletal events.  Patient had declined previously.  She consented understanding indications, benefits and risks including but not limited to nephrotoxicity, hypocalcemia, osteoradionecrosis of the jaw.  Her arm is in a brace.  She has not received additional doses due to a rise in her creatinine which was likely multifactorial in nature.  I placed her on xgeva for bone pain control, reduce risk for pathological fractures and for anti-cancer properties in bones.    2. Bone Pain secondary #3, improving. --Started palliative XRT to left/right femur on 02/02/2014 per Dr. Roselind Messier.  She  received 30 Gy in 10 fractions. It ended on 02/24/2014.  She reports moderate improvement.she follows again with Radiation Oncology on 07/12/14.  3.  Metastatic breast cancer (ER pos/PR pos/ HER2 neg) with spread to bone, lungs.  -Clinically, she has had a response with stable breast mass. However, her recent CT of Neck and arm x-rays show progressive bone lesions.  Further, her  CT of Chest showed a new lung nodule.  She is tolerating letrozole well without bone/muscle pain, hot flash/night sweat.  We added palbociclib based on Irving Copas, 2015)  She takes 125 mg daily for 21 days then 7 days off with her letrozole daily.  Previously, we discussed extensively the indications, benefits and  risks including but not limited to abnormal absolute lymphocyte count, neutropenia, leukopenia, thrombocytopenia, weakness, alopecia and fatigue and nausea.  She understood these risks and agree to proceed.  She is on day #22 with a drop in her hematological counts.  Given persistent low counts we recommended to START  2 weeks on followed by one week off of palbociclib.  Her CA 27.29 is stable. Restaging scan shows moderate control of visceral disease but few bone lesions progressing like left hip, L3 vertebra and right humerus. Rivka Barbara will help. I will see her back in 1 month with labs and repeat exam including her tumor marker [id number].Continue the current regimen of Femara/Palbociclib and Xgeva.  5. Stage III (T1N1M0) Laryngeal Carcinoma, probable.   --She has been seen by radiation oncology.  In the presence of Nurse Ardine Eng, we reviewed her CT of neck as noted above and she  agreed to further evaluation by Dr. Sondra Come.  She declined intervention presently.  She is being followed by Dr. Sondra Come.   6. Leukopenia/Anemia of chronic disease plus/minus iron defiency. -- Past work up for reversible causes of anemia were negative.  --Her WBC are low but stable.  This can in part be explained by her palbociclib.  We will monitor her labs.  7. Grade 2 dystolic dysfunction. --She is not fluid overloaded today on exam. She is on Lasix, nitrogylerin prn, losartan. Increase lasix prn and weight daily. Cards follow up today.  8. Diabetes mellitus, type II. Diet controlled.   9. COPD: On albuterol prn. She remains on home oxygen.  10. Chronic renal insufficiency. Using Xgeva because of safe renal profile.    11. Follow up: In about 1 month with CBC, CMP, CA 27 29. Next scan to be arranged in January or February 2016. Next visit with Dr Burr Medico in Oncology as I am leaving practice.  All questions were answered. The patient knows to call the clinic with any problems, questions or concerns. We can certainly see the  patient much sooner if necessary.  I spent 25 minutes counseling the patient face to face. The total time spent in the appointment was 40 minutes.    Bernadene Bell, MD Medical Hematologist/Oncologist [place] Pager: [phone] Office No: [phone]

## 2014-08-03 ENCOUNTER — Other Ambulatory Visit: Payer: Self-pay | Admitting: *Deleted

## 2014-08-03 MED ORDER — TORSEMIDE 20 MG PO TABS: 40.0000 mg | ORAL_TABLET | Freq: Two times a day (BID) | ORAL | Status: DC

## 2014-08-05 ENCOUNTER — Encounter (HOSPITAL_COMMUNITY): Payer: Self-pay | Admitting: Cardiology

## 2014-08-10 ENCOUNTER — Telehealth: Payer: Self-pay | Admitting: Family Medicine

## 2014-08-10 ENCOUNTER — Ambulatory Visit (INDEPENDENT_AMBULATORY_CARE_PROVIDER_SITE_OTHER): Payer: Medicare HMO | Admitting: Physician Assistant

## 2014-08-10 ENCOUNTER — Encounter: Payer: Self-pay | Admitting: Physician Assistant

## 2014-08-10 VITALS — BP 126/64 | HR 98 | Ht 62.0 in | Wt 250.4 lb

## 2014-08-10 DIAGNOSIS — I5033 Acute on chronic diastolic (congestive) heart failure: Secondary | ICD-10-CM

## 2014-08-10 DIAGNOSIS — N183 Chronic kidney disease, stage 3 unspecified: Secondary | ICD-10-CM

## 2014-08-10 MED ORDER — ISOSORBIDE MONONITRATE ER 60 MG PO TB24: 60.0000 mg | ORAL_TABLET | Freq: Every day | ORAL | Status: DC

## 2014-08-10 NOTE — Patient Instructions (Addendum)
Your physician recommends that you schedule a follow-up appointment in: [place] with Dr. Percival Spanish.   CHANGE your Torsemide to 80mg  in the mornings, then alternate the evening dose between 40mg  and 80mg 

## 2014-08-10 NOTE — Assessment & Plan Note (Signed)
She is very concerned about dryness on her kidneys and is resistant to increasing any of her medications.  She will not take the metolazone any longer. She continues to have tense lower extremity edema.  I've asked her to increase her torsemide evening dose by alternating it with the 40 she takes now and 80 mg.  She is going to [facility] for cancer treatment is Thursday and will get her basic metabolic panel then. I asked her to monitor her weight daily and call us weight continues to go up.

## 2014-08-10 NOTE — Telephone Encounter (Signed)
Pt called and needs refills on her isosorbide mononitrate called in to [facility] . Plus the patient is also requesting a bigger bed side commode and dry products? She said that we can call [place] at [facility] to further explain. jw

## 2014-08-10 NOTE — Progress Notes (Signed)
Patient ID: Anita [name], female   DOB: 1946/08/11, 68 y.o.   MRN: [id number]    Date:  08/10/2014   ID:  Anita [name], DOB [birth year], MRN [id number]  PCP:  [name], DO  Primary Cardiologist:      History of Present Illness: Anita [name] is a 69 y.o. female Anita [name] is a 68 y.o. female followed by Dr Percival Spanish, with history of CAD s/p NSTEMI in 2014, COPD, obesity, HTN, DM, diastolic HF and breast cancer with bone metastasis, tobacco abuse quit in 2014.   She has been admitted multiple times (5 in the past 6 months) for A/C HF. She presented to the ED 06/18/14 for volume overload and increased SOB and DOE. Attempts made to manage her as an outpatient failed. Central access was placed to further assist with volume status. She was diuresed with Lasix drip + metolazone. As she improved she was transitioned back to her home regimen torsemide 80 mg in am and 40 mg in pm with 20 meq of potassium. Overall she diuresed 9 pounds. Creatinine on discharge was 2.4. Creatinine on 07/20/2014 was 1.8. She received extensive education on limiting fluid intake to < 2 liters per day and low salt food choices. She will continue to be followed by [facility]. Patient's last 2-D echocardiogram was June 2015 and her ejection fraction was 55%. Images however, were not adequate for LV wall motion assessment.  When I saw her last on Nov 25 she stated her weight was going up and her lower extremities are weeping fluid. Her daughter is a CNA keeps her legs wrapped. She sleeps in a recliner with her legs elevated. She is on chronic oxygen therapy. She is mostly short of breath with ambulation. She denies any paroxysmal nocturnal dyspnea. Her weight is now up 9 pounds from discharge. She makes all her own meals.   Her creatinine on November 25 was 1.8 which was an improvement from 2.41.  Put her on 3 doses of metolazone every other day during her last office visit. She reports that she took metolazone however,  she urinated on herself and will not take it again.   She currently denies nausea, vomiting, fever, chest pain, dizziness, PND, cough, congestion, abdominal pain, hematochezia, melena, claudication.  She is very concerned about drying out her kidneys and is resistant to increasing any of her medications.  She continues to have tense lower extremity edema.  Wt Readings from Last 3 Encounters:  08/10/14 250 lb 6.4 oz (113.581 kg)  07/21/14 247 lb 9.6 oz (112.311 kg)  07/21/14 249 lb 14.4 oz (113.354 kg)     Past Medical History  Diagnosis Date  . Fibroid   . Morbid obesity   . 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 COPD (chronic obstructive pulmonary disease)   . Chronic diastolic CHF (congestive heart failure)   . NSTEMI (non-ST elevated myocardial infarction)     Cath November 2014 LAD 40% stenosis, first diagonal 80% stenosis, circumflex 20% stenosis, right coronary artery occluded. The EF was 35-40% at that time.  . Anemia     a. Adm 09/2012 with melena, Hgb 5.8 -> transfused. EGD/colonoscopy unrevealing.  . Breast cancer     a. Mets to bone. ER 100%/ PR 0%/Her2 neu negative.  [name] Ulcers of both lower legs   . Tobacco abuse   . Hyperlipidemia 02/11/2013  . Chronic respiratory failure     a.  On O2 qhs. also portable O2.  . Chronic renal insufficiency   . Lesion of vocal cord     a. CT 05/2013 concerning for tumor.  . Shortness of breath   . Depression   . Diabetes mellitus without complication   . Anginal pain   . Dementia     very mild  . Radiation 02/02/14-02/24/14    left and right femur 30 gray    Current Outpatient Prescriptions  Medication Sig Dispense Refill  . aspirin EC 81 MG EC tablet Take 1 tablet (81 mg total) by mouth daily. 30 tablet 3  . atorvastatin (LIPITOR) 40 MG tablet Take 40 mg by mouth daily.    . clotrimazole (LOTRIMIN) 1 % cream Apply 1 application topically daily. 30 g 1  . isosorbide mononitrate (IMDUR) 60 MG 24 hr  tablet Take 1 tablet (60 mg total) by mouth daily. 30 tablet 5  . letrozole (FEMARA) 2.5 MG tablet Take 1 tablet (2.5 mg total) by mouth daily. 90 tablet 0  . metolazone (ZAROXOLYN) 2.5 MG tablet Take one tablet  30 minutes before furosemide every other day x 3 doses. 30 tablet 0  . mirtazapine (REMERON) 15 MG tablet Take 1 tablet (15 mg total) by mouth at bedtime. 30 tablet 5  . nitroGLYCERIN (NITROSTAT) 0.4 MG SL tablet Place 1 tablet (0.4 mg total) under the tongue every 5 (five) minutes as needed for chest pain. 30 tablet 0  . NON FORMULARY Place 3 L into the nose as needed (oxygen).     . palbociclib (IBRANCE) 125 MG capsule Take 125 mg by mouth See admin instructions. Take 125mg  every morning for 2 weeks, hold for a week, then repeat. Take whole with food.    . potassium chloride SA (K-DUR,KLOR-CON) 20 MEQ tablet Take 1 tablet (20 mEq total) by mouth daily. 60 tablet 5  . tiotropium (SPIRIVA) 18 MCG inhalation capsule Place 1 capsule (18 mcg total) into inhaler and inhale daily. 30 capsule 6  . torsemide (DEMADEX) 20 MG tablet Take 2-4 tablets (40-80 mg total) by mouth 2 (two) times daily. 80mg  in the morning and 40mg  in the evening 120 tablet 3  . VITAMIN D, CHOLECALCIFEROL, PO Take 1,000 mg by mouth daily.      No current facility-administered medications for this visit.    Allergies:    Allergies  Allergen Reactions  . Omnipaque [Iohexol]     Pt claims she developed hives after given contrast  . Benzene Rash    Social History:  The patient  reports that she quit smoking about 22 months ago. Her smoking use included Cigarettes. She has a 80 pack-year smoking history. She has never used smokeless tobacco. She reports that she does not drink alcohol or use illicit drugs.   Family history:   Family History  Problem Relation Age of Onset  . Cancer Mother     leukemia  . Hypertension Mother   . Diabetes Father   . Heart disease Father     passed away due to heart attack    ROS:   Please see the history of present illness.  All other systems reviewed and negative.   PHYSICAL EXAM: VS:  BP 126/64 mmHg  Pulse 98  Ht 5\' 2"  (1.575 m)  Wt 250 lb 6.4 oz (113.581 kg)  BMI 45.79 kg/m2  SpO2 96% Morbidly obese, well developed, in no acute distress HEENT: Pupils are equal round react to light accommodation extraocular movements are intact.  Neck: No  cervical lymphadenopathy. Cardiac: Regular rate and rhythm without murmurs rubs or gallops. Lungs:  Breath sounds decreased however they are clear to auscultation bilaterally, no wheezing, rhonchi or rales Ext: 3+ tense lower extremity edema.  2+ radial pulses. Skin: warm and dry Neuro:  Grossly normal   ASSESSMENT AND PLAN:  Problem List Items Addressed This Visit    Acute on chronic diastolic ACC/AHA stage C congestive heart failure - Primary    She is very concerned about dryness on her kidneys and is resistant to increasing any of her medications.  She will not take the metolazone any longer. She continues to have tense lower extremity edema.  I've asked her to increase her torsemide evening dose by alternating it with the 40 she takes now and 80 mg.  She is going to [facility] for cancer treatment is Thursday and will get her basic metabolic panel then. I asked her to monitor her weight daily and call us weight continues to go up.        Blood pressure stable.

## 2014-08-10 NOTE — Assessment & Plan Note (Signed)
Rechecking serum creatinine on Thursday

## 2014-08-10 NOTE — Telephone Encounter (Signed)
Anita [name], what does she want/need?

## 2014-08-10 NOTE — Telephone Encounter (Signed)
Isosorbide sent in. Anita [name], can you figure out what she needs. Will forward to [facility] to see if she can help as well.

## 2014-08-11 ENCOUNTER — Telehealth: Payer: Self-pay | Admitting: *Deleted

## 2014-08-11 ENCOUNTER — Telehealth: Payer: Self-pay | Admitting: Physician Assistant

## 2014-08-11 ENCOUNTER — Other Ambulatory Visit: Payer: Self-pay | Admitting: Family Medicine

## 2014-08-11 ENCOUNTER — Other Ambulatory Visit: Payer: Self-pay | Admitting: *Deleted

## 2014-08-11 ENCOUNTER — Telehealth: Payer: Self-pay | Admitting: Family Medicine

## 2014-08-11 DIAGNOSIS — N183 Chronic kidney disease, stage 3 unspecified: Secondary | ICD-10-CM

## 2014-08-11 DIAGNOSIS — R0602 Shortness of breath: Secondary | ICD-10-CM

## 2014-08-11 NOTE — Telephone Encounter (Signed)
[name] returned my call stating she is aware of the appointments on 08-18-2014.  "I need to have my kidney function checked because I'm having swelling and the doctor wants me to increase my fluid pills to six pills a day and I'm not doing that without having my kidney function checked.  The medicine can dry up your kidneys.  I've worked it out with [facility] to have the blood work done, I need labs done every week."  Noted Demadex/Torsemide on medication list.

## 2014-08-11 NOTE — Telephone Encounter (Signed)
Lab was ordered.  She can call and schedule a time to come in.  [name] DO

## 2014-08-11 NOTE — Telephone Encounter (Signed)
Patient called scheduler about 11:00 am reporting [facility] Nurse faxed lab orders to scheduler for Dr. Percival Spanish for tomorrow at 12:30 pm.  Informed patient to contact Dr. Rosezella [place] office.  Reports he is monitoring her kidneys.  Call ended. Called her mobile and home numbers.  Message left on home number that she is scheduled her on 08-18-2014 for labs that will be viewable by this provider per EPIC.  Awaiting return call for clarification.

## 2014-08-11 NOTE — Telephone Encounter (Signed)
Please call,concerning her lab work she needs to have.

## 2014-08-11 NOTE — Telephone Encounter (Signed)
Pt called because he other doctor Anita [name] put in orders in Epic for her to have a Basic Metabolic Panel done. The problem is we can not do them at out clinic unless Dr. Lacinda [name] puts the orders in. She was hoping that Dr. Lacinda [name] could authorize her to have her blood work here. Please call patient to inform if they can or can not. jw

## 2014-08-11 NOTE — Telephone Encounter (Signed)
Spoke with patient and informed her of below 

## 2014-08-11 NOTE — Telephone Encounter (Signed)
Anita [name], what equipment does she need?  CSW will fax order once placed.

## 2014-08-11 NOTE — Telephone Encounter (Signed)
Pt. Called and I ordered a bmp for her to have drawn ovre at Dr. Dellia Cloud office today

## 2014-08-11 NOTE — Telephone Encounter (Signed)
LVM for patient to call back to inform her of below and schedule lab visit.

## 2014-08-12 ENCOUNTER — Ambulatory Visit
Admission: RE | Admit: 2014-08-12 | Discharge: 2014-08-12 | Disposition: A | Discharge status: Home / Self Care | Payer: Medicare HMO | Source: Ambulatory Visit | Attending: Radiation Oncology | Admitting: Radiation Oncology

## 2014-08-12 ENCOUNTER — Encounter: Payer: Self-pay | Admitting: Radiation Oncology

## 2014-08-12 VITALS — BP 128/43 | HR 81 | Temp 98.4°F | Ht 62.0 in | Wt 246.9 lb

## 2014-08-12 DIAGNOSIS — C7951 Secondary malignant neoplasm of bone: Principal | ICD-10-CM

## 2014-08-12 DIAGNOSIS — C50919 Malignant neoplasm of unspecified site of unspecified female breast: Secondary | ICD-10-CM

## 2014-08-12 NOTE — Progress Notes (Signed)
Anita [name] here for follow up after treatment to her left and right femurs.  She reports pain in her right hip area that radiates around to her buttock.  She reports she is able to ambulate at home without a cane.  She is in a wheelchair today.  She reports the skin on her upper legs is intact.  She has ace bandage wraps on her lower legs for edema.  She is currently talking 80 mg of Torsemide in the am and 40 mg at night.  She reports fatigue.  She is using 3 L of oxygen via nasal canula.

## 2014-08-12 NOTE — Progress Notes (Signed)
Radiation Oncology         [phone] ________________________________  Name: Anita [name] MRN: [id number]  Date: 08/12/2014  DOB: February 20, 1946  Follow-Up Visit Note  CC: [name], DO  Coral Spikes, DO    ICD-9-CM ICD-10-CM   1. Breast cancer metastasized to bone, unspecified laterality 174.9 C50.919    198.5 C79.51     Diagnosis:   Metastatic breast cancer with painful osseous metastasis  Interval Since Last Radiation:  6  months, the patient completed treatments to the left and right femur area.   Narrative:  The patient returns today for routine follow-up.   Patient at this time complains of pain in her right pelvis area. I reviewed the patient's most recent CT scan performed October 12 which showed diffuse osseous metastasis but no big lesion within the right pelvis area she however does have a significant lesion in the mid lumbar spine with extensive destruction which may be causing referred pain into the right pelvis.  She denies any bowel or bladder incontinence. She is able to walk. She continues on supplemental oxygen. She has been admitted 5 times in the past 6 months for heart failure/ volume overload.                             ALLERGIES:  is allergic to omnipaque and benzene.  Meds: Current Outpatient Prescriptions  Medication Sig Dispense Refill  . aspirin EC 81 MG EC tablet Take 1 tablet (81 mg total) by mouth daily. 30 tablet 3  . atorvastatin (LIPITOR) 40 MG tablet Take 40 mg by mouth daily.    . clotrimazole (LOTRIMIN) 1 % cream Apply 1 application topically daily. 30 g 1  . isosorbide mononitrate (IMDUR) 60 MG 24 hr tablet Take 1 tablet (60 mg total) by mouth daily. 30 tablet 5  . letrozole (FEMARA) 2.5 MG tablet Take 1 tablet (2.5 mg total) by mouth daily. 90 tablet 0  . mirtazapine (REMERON) 15 MG tablet Take 1 tablet (15 mg total) by mouth at bedtime. 30 tablet 5  . NON FORMULARY Place 3 L into the nose as needed (oxygen).     . palbociclib (IBRANCE)  125 MG capsule Take 125 mg by mouth See admin instructions. Take 125mg  every morning for 2 weeks, hold for a week, then repeat. Take whole with food.    . potassium chloride SA (K-DUR,KLOR-CON) 20 MEQ tablet Take 1 tablet (20 mEq total) by mouth daily. 60 tablet 5  . tiotropium (SPIRIVA) 18 MCG inhalation capsule Place 1 capsule (18 mcg total) into inhaler and inhale daily. 30 capsule 6  . torsemide (DEMADEX) 20 MG tablet Take 2-4 tablets (40-80 mg total) by mouth 2 (two) times daily. 80mg  in the morning and 40mg  in the evening 120 tablet 3  . VITAMIN D, CHOLECALCIFEROL, PO Take 1,000 mg by mouth daily.     . isosorbide mononitrate (IMDUR) 60 MG 24 hr tablet Take 1 tablet (60 mg total) by mouth daily. (Patient not taking: Reported on 08/12/2014) 30 tablet 5  . metolazone (ZAROXOLYN) 2.5 MG tablet Take one tablet  30 minutes before furosemide every other day x 3 doses. (Patient not taking: Reported on 08/12/2014) 30 tablet 0  . nitroGLYCERIN (NITROSTAT) 0.4 MG SL tablet Place 1 tablet (0.4 mg total) under the tongue every 5 (five) minutes as needed for chest pain. (Patient not taking: Reported on 08/12/2014) 30 tablet 0   No current facility-administered  medications for this encounter.    Physical Findings: The patient is in no acute distress. Patient is alert and oriented.  height is 5\' 2"  (1.575 m) and weight is 246 lb 14.4 oz (111.993 kg). Her oral temperature is 98.4 F (36.9 C). Her blood pressure is 128/43 and her pulse is 81. Her oxygen saturation is 99%. .  Patient has supplemental oxygen in place. She remains in a wheelchair for the examination. The lungs appear clear except for basilar crackles. The heart has a regular rhythm and rate. Motor strength appears to be 5 out of 5 in the proximal and distal muscle groups of the lower extremities.  Lab Findings: Lab Results  Component Value Date   WBC 2.7* 07/21/2014   HGB 8.3* 07/21/2014   HCT 25.7* 07/21/2014   MCV 91.8 07/21/2014   PLT  168 07/21/2014    Radiographic Findings: No results found.  Impression:  Progressive osseous metastatic disease. Patient is having pain in the right pelvis which is likely related to her extensive lesion in the lumbar spine. I discussed palliative radiation therapy directed to this area with the patient. We discussed side effects course of treatment. She appears to understand and wishes to proceed with planned course of treatment.  Plan:  CT simulation in the near future. Anticipate approximately 10-14 treatments directed at the lumbar spine area.  ____________________________________ Blair Promise, MD

## 2014-08-13 ENCOUNTER — Other Ambulatory Visit: Payer: Commercial Managed Care - HMO

## 2014-08-13 DIAGNOSIS — N183 Chronic kidney disease, stage 3 unspecified: Secondary | ICD-10-CM

## 2014-08-13 LAB — BASIC METABOLIC PANEL
BUN: 33 mg/dL — ABNORMAL HIGH (ref 6–23)
CO2: 27 meq/L (ref 19–32)
Calcium: 8.5 mg/dL (ref 8.4–10.5)
Chloride: 101 mEq/L (ref 96–112)
Creat: 1.76 mg/dL — ABNORMAL HIGH (ref 0.50–1.10)
Glucose, Bld: 126 mg/dL — ABNORMAL HIGH (ref 70–99)
Potassium: 3.9 mEq/L (ref 3.5–5.3)
Sodium: 140 mEq/L (ref 135–145)

## 2014-08-13 NOTE — Progress Notes (Signed)
BMP DONE TODAY Anita [name] 

## 2014-08-17 ENCOUNTER — Ambulatory Visit: Admission: RE | Admit: 2014-08-17 | Payer: Medicare HMO | Source: Ambulatory Visit | Admitting: Radiation Oncology

## 2014-08-17 ENCOUNTER — Other Ambulatory Visit: Payer: Commercial Managed Care - HMO | Admitting: Radiation Oncology

## 2014-08-18 ENCOUNTER — Ambulatory Visit (HOSPITAL_BASED_OUTPATIENT_CLINIC_OR_DEPARTMENT_OTHER): Payer: Commercial Managed Care - HMO | Admitting: Lab

## 2014-08-18 ENCOUNTER — Ambulatory Visit (HOSPITAL_BASED_OUTPATIENT_CLINIC_OR_DEPARTMENT_OTHER): Payer: Commercial Managed Care - HMO

## 2014-08-18 ENCOUNTER — Encounter: Payer: Self-pay | Admitting: Physician Assistant

## 2014-08-18 ENCOUNTER — Telehealth: Payer: Self-pay | Admitting: Hematology

## 2014-08-18 ENCOUNTER — Ambulatory Visit (HOSPITAL_BASED_OUTPATIENT_CLINIC_OR_DEPARTMENT_OTHER): Payer: Commercial Managed Care - HMO | Admitting: Physician Assistant

## 2014-08-18 ENCOUNTER — Telehealth: Payer: Self-pay | Admitting: Physician Assistant

## 2014-08-18 VITALS — BP 116/55 | HR 96 | Temp 98.4°F | Resp 20 | Ht 62.0 in | Wt 248.0 lb

## 2014-08-18 DIAGNOSIS — D72818 Other decreased white blood cell count: Secondary | ICD-10-CM

## 2014-08-18 DIAGNOSIS — C7951 Secondary malignant neoplasm of bone: Secondary | ICD-10-CM

## 2014-08-18 DIAGNOSIS — D638 Anemia in other chronic diseases classified elsewhere: Secondary | ICD-10-CM

## 2014-08-18 DIAGNOSIS — N189 Chronic kidney disease, unspecified: Secondary | ICD-10-CM

## 2014-08-18 DIAGNOSIS — C50919 Malignant neoplasm of unspecified site of unspecified female breast: Secondary | ICD-10-CM

## 2014-08-18 DIAGNOSIS — C50411 Malignant neoplasm of upper-outer quadrant of right female breast: Secondary | ICD-10-CM

## 2014-08-18 DIAGNOSIS — C7800 Secondary malignant neoplasm of unspecified lung: Secondary | ICD-10-CM

## 2014-08-18 DIAGNOSIS — C50911 Malignant neoplasm of unspecified site of right female breast: Secondary | ICD-10-CM

## 2014-08-18 LAB — COMPREHENSIVE METABOLIC PANEL (CC13)
ALBUMIN: 3.3 g/dL — AB (ref 3.5–5.0)
ALT: 20 U/L (ref 0–55)
ANION GAP: 10 meq/L (ref 3–11)
AST: 20 U/L (ref 5–34)
Alkaline Phosphatase: 86 U/L (ref 40–150)
BUN: 28.8 mg/dL — AB (ref 7.0–26.0)
CALCIUM: 9.1 mg/dL (ref 8.4–10.4)
CHLORIDE: 104 meq/L (ref 98–109)
CO2: 27 meq/L (ref 22–29)
CREATININE: 1.6 mg/dL — AB (ref 0.6–1.1)
EGFR: 33 mL/min/{1.73_m2} — ABNORMAL LOW (ref >90)
Glucose: 144 mg/dl — ABNORMAL HIGH (ref 70–140)
Potassium: 3.6 mEq/L (ref 3.5–5.1)
Sodium: 140 mEq/L (ref 136–145)
Total Bilirubin: 0.31 mg/dL (ref 0.20–1.20)
Total Protein: 6.7 g/dL (ref 6.4–8.3)

## 2014-08-18 LAB — CBC WITH DIFFERENTIAL/PLATELET
BASO%: 1.4 % (ref 0.0–2.0)
BASOS ABS: 0.1 10*3/uL (ref 0.0–0.1)
EOS%: 0.9 % (ref 0.0–7.0)
Eosinophils Absolute: 0 10*3/uL (ref 0.0–0.5)
HEMATOCRIT: 27.3 % — AB (ref 34.8–46.6)
HGB: 8.7 g/dL — ABNORMAL LOW (ref 11.6–15.9)
LYMPH#: 0.4 10*3/uL — AB (ref 0.9–3.3)
LYMPH%: 9.9 % — ABNORMAL LOW (ref 14.0–49.7)
MCH: 29 pg (ref 25.1–34.0)
MCHC: 31.9 g/dL (ref 31.5–36.0)
MCV: 91 fL (ref 79.5–101.0)
MONO#: 0.7 10*3/uL (ref 0.1–0.9)
MONO%: 16.6 % — ABNORMAL HIGH (ref 0.0–14.0)
NEUT%: 71.2 % (ref 38.4–76.8)
NEUTROS ABS: 3.1 10*3/uL (ref 1.5–6.5)
Platelets: 272 10*3/uL (ref 145–400)
RBC: 3 10*6/uL — ABNORMAL LOW (ref 3.70–5.45)
RDW: 17.7 % — ABNORMAL HIGH (ref 11.2–14.5)
WBC: 4.3 10*3/uL (ref 3.9–10.3)

## 2014-08-18 LAB — TECHNOLOGIST REVIEW

## 2014-08-18 MED ORDER — DENOSUMAB 120 MG/1.7ML ~~LOC~~ SOLN
120.0000 mg | Freq: Once | SUBCUTANEOUS | Status: AC
  Administered 2014-08-18: 120 mg via SUBCUTANEOUS
  Filled 2014-08-18: qty 1.7

## 2014-08-18 NOTE — Progress Notes (Signed)
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 Biopsy only. Added Palbociclib 125 mg daily for 21 days on and 7 days off on 01/01/2014. Dose modified to 2 weeks on 1 week off with cycle 3.  Adjunctive zometa added on 01/01/2014.  Started palliative XRT to the left and right femur (30 Gy in 10 fractions) on 02/02/2014 per Dr. Gery Pray which was finished on 02/24/2014. Zometa held due to renal insufficiency. Dr. Lona Kettle started Delton See because of skeletal disease progression and safe renal profile.  INTERVAL HISTORY:  Anita [name] 69 y.o. female with a history of right breast cancer with metastases to the bone/lungs here for 1 month followup.  She was last seen by Dr. Lona Kettle on 07/21/2014.     Today, she came by herself.   She states that she was evaluated by Dr. Randa Ngo didn't is to have a simulation visit on 09/01/2014. He is planning some palliative radiation therapy to her lumbar spine. The patient is concerned that she is having some right hip pain and is wondering whether this will be addressed. She continues on Femara and Ibrance at the above frequency and is tolerating both medications without difficulty. She is also treated with Delton See on a monthly basis and has had no issues with bleeding or bruising. She also denies any teeth or jaw pain. She denied any issues with fever or chills, nausea or vomiting, diarrhea or constipation. Her shortness of breath remains at baseline, she is on home oxygen and is followed by Dr. Halford Chessman in pulmonary and has a follow-up appointment with him on 09/08/2013. She will also see her cardiologist Dr. Percival Spanish for follow-up on 09/10/2014 regarding her cardiac issues as well as her lower  extremity edema.    CLINICAL DATA: metastatic breast cancer Breast cancer metastasized to bone, right C50.911, C79.51 (ICD-10-CM). Subsequent encounter. EXAM:  CT CHEST, ABDOMEN AND PELVIS WITHOUT CONTRAST TECHNIQUE: Multidetector CT imaging of the chest, abdomen and pelvis was performed following the standard protocol without IV contrast. COMPARISON: PET 08/10/2013. Chest radiograph of 04/02/2014. Prior diagnostic CTs of 05/13/2013.  FINDINGS: CT CHEST FINDINGS Lungs/Pleura: Mild motion degradation. Subpleural right lower lobe 5 mm nodule on image 22, unchanged. 4 mm left upper lobe nodule on image 24 is similar. The right lower lobe nodule described on the prior exam may be identified at 4 mm on image 22. No pleural fluid.  Heart/Mediastinum: Left thyroid enlargement, similar. No axillary adenopathy. Mild cardiomegaly. Atherosclerosis, including within the coronary arteries. No pericardial effusion. No mediastinal or definite hilar adenopathy, given limitations of unenhanced CT. CT ABDOMEN AND PELVIS FINDINGS Motion degradation continuing into the abdomen and pelvis. Hepatobiliary: Marked hepatic steatosis. Normal gallbladder, without biliary ductal dilatation. Pancreas: Normal, without mass or pancreatic ductal dilatation. Spleen: Normal Urinary tract: Normal adrenal glands. Staghorn left renal calculus. Maximally 3.2 cm. Mild left renal cortical thinning. No gross hydronephrosis. Normal right kidney. No bladder calculi. Stomach/Bowel: Normal stomach, without wall thickening. Normal terminal ileum and appendix. Normal small bowel without abdominal ascites. Vascular/Lymphatic: Aortic and branch vessel atherosclerosis. Preaortic node measures 9 mm on image 64 versus 7 mm on the prior. The left periaortic node measures 1.0 cm and maintains its fatty hilum. Unchanged. Prominent inguinal nodes are similar and likely reactive.  Reproductive: Dystrophic calcifications within the uterus likely relate to underlying  fibroids. No adnexal mass.Other: No evidence of omental or peritoneal disease. No significant free fluid. Musculoskeletal: Widespread osseous metastasis, as evidenced by heterogeneous marrow density. More well-defined lucent lesions today. Example 2.0 cm left acetabular lesion versus 1.1 cm on 05/13/2013. Eccentric right L3 vertebral body lesion on image 68 of series 2.  New lytic lesion in the right humeral head on image 5. Interval healing left-sided rib fractures, likely pathologic.  CT CHEST IMPRESSION 1. Motion degraded exam. 2. No evidence of extraosseous metastatic disease within the chest. 3. Similar nonspecific pulmonary nodules. 4. Progressive osseous metastasis.  CT ABDOMEN AND PELVIS IMPRESSION 1. Motion degraded exam. 2. Slight enlargement of a preaortic node which is indeterminate. Recommend attention on follow-up. 3. Progressive osseous metastasis, including development of multiple lytic lesions and presumably pathologic left rib fractures. The vertebral body lesions could predispose the patient to cord compression. If there is any such symptoms, pre and post contrast spine MRI should be considered. Left acetabular lesion could predispose the patient to pathologic fracture 4. Left sided staghorn calculus. 5. Hepatic steatosis. Electronically Signed By: Abigail Miyamoto M.D. On: 06/07/2014 16:14  Per Dr. Lona Kettle  another scan was to be planned in January 2016 for restaging of her cancer. In the interim, we will continue the current therapy.   MEDICAL HISTORY: Past Medical History  Diagnosis Date  . Fibroid   . Morbid obesity   . 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 COPD (chronic obstructive pulmonary disease)   . Chronic diastolic CHF (congestive heart failure)   . NSTEMI (non-ST elevated myocardial infarction)     Cath November 2014 LAD 40% stenosis, first diagonal 80% stenosis, circumflex 20% stenosis, right coronary artery occluded. The EF was 35-40%  at that time.  . Anemia     a. Adm 09/2012 with melena, Hgb 5.8 -> transfused. EGD/colonoscopy unrevealing.  . Breast cancer     a. Mets to bone. ER 100%/ PR 0%/Her2 neu negative.  [name] Ulcers of both lower legs   . Tobacco abuse   . Hyperlipidemia 02/11/2013  . Chronic respiratory failure     a. On O2 qhs. also portable O2.  . Chronic renal insufficiency   . Lesion of vocal cord     a. CT 05/2013 concerning for tumor.  . Shortness of breath   . Depression   . Diabetes mellitus without complication   . Anginal pain   . Dementia     very mild  . Radiation 02/02/14-02/24/14    left and right femur 30 gray   INTERIM HISTORY: has DM2 (diabetes mellitus, type 2); COPD (chronic obstructive pulmonary disease); Chronic respiratory failure; Chronic combined systolic and diastolic congestive heart failure; CAD (coronary artery disease); Chronic kidney disease, stage III (moderate); Anemia of chronic disease; Breast mass, right; History of tobacco abuse; Breast cancer metastasized to bone; Hyperlipidemia; Lesion of vocal cord; Closed right arm fracture; Lower extremity weakness; Intertrigo; Pressure ulcer stage II; Acute on chronic combined systolic and diastolic congestive heart failure, NYHA class 4; Acute on chronic respiratory failure; Acute on chronic diastolic heart failure; Acute on chronic diastolic ACC/AHA stage C congestive heart failure; UTI (urinary tract infection); Hypokalemia; and Acute on chronic renal failure on her problem list.    ALLERGIES:  is allergic to omnipaque and benzene.  MEDICATIONS: has a current medication list which includes the following prescription(s): aspirin, atorvastatin, clotrimazole, isosorbide  mononitrate, letrozole, mirtazapine, NON FORMULARY, palbociclib, potassium chloride sa, tiotropium, torsemide, cholecalciferol, metolazone, and nitroglycerin.  SURGICAL HISTORY:  Past Surgical History  Procedure Laterality Date  . Colonoscopy, esophagogastroduodenoscopy (egd)  and esophageal dilation N/A 10/13/2012    Procedure: colonoscopy and egd;  Surgeon: Wonda Horner, MD;  Location: [facility];  Service: Endoscopy;  Laterality: N/A;  . Left heart catheterization with coronary angiogram N/A 07/07/2013    Procedure: LEFT HEART CATHETERIZATION WITH CORONARY ANGIOGRAM;  Surgeon: Peter M Martinique, MD;  Location: [facility] CATH LAB;  Service: Cardiovascular;  Laterality: N/A;   REVIEW OF SYSTEMS:   Constitutional: Denies fevers, chills or abnormal weight loss Eyes: Denies blurriness of vision Ears, nose, mouth, throat, and face: Denies mucositis or sore throat Respiratory: Denies cough, dyspnea or wheezes Cardiovascular: Denies palpitation, chest discomfort or lower extremity swelling Gastrointestinal:  Denies nausea, heartburn or change in bowel habits Skin: Denies abnormal skin rashes Lymphatics: Denies new lymphadenopathy or easy bruising Neurological:Denies numbness, tingling or new weaknesses Behavioral/Psych: Mood is stable, no new changes  All other systems were reviewed with the patient and are negative.  PHYSICAL EXAMINATION: ECOG PERFORMANCE STATUS: 1  Blood pressure 116/55, pulse 96, temperature 98.4 F (36.9 C), temperature source Oral, resp. rate 20, height $RemoveBe'5\' 2"'[id number]$  (1.575 m), weight 248 lb (112.492 kg), SpO2 96 %.  GENERAL:alert, no distress and comfortable morbidly obese woman, chronically ill appearing SKIN: skin color, texture, turgor are normal; posterior legs with irritation and erythema with a small abrasion on the posterior left thigh with mild TTP.  EYES: normal, Conjunctiva are pink and non-injected, sclera clear OROPHARYNX:no exudate, no erythema and lips, buccal mucosa, and tongue normal  NECK: supple, thyroid normal size, non-tender, without nodularity LYMPH:  no palpable lymphadenopathy in the cervical, axillary LUNGS: coarse breathing with slightly increased breathing rate HEART: regular rate & rhythm and no murmurs and 2+ lower extremity  edema BREAST: deferred ABDOMEN:abdomen soft, non-tender and normal bowel sounds; obese Musculoskeletal:no cyanosis of digits and no clubbing; R arm in a brace NEURO: alert & oriented x 3 with fluent speech, no focal motor/sensory deficits  LABORATORY DATA: CBC    Component Value Date/Time   WBC 4.3 08/18/2014 1339   WBC 4.5 06/18/2014 1921   RBC 3.00* 08/18/2014 1339   RBC 2.87* 06/18/2014 1921   RBC 3.18* 09/26/2012 0518   HGB 8.7* 08/18/2014 1339   HGB 8.5* 06/18/2014 1921   HCT 27.3* 08/18/2014 1339   HCT 27.0* 06/18/2014 1921   PLT 272 08/18/2014 1339   PLT 250 06/18/2014 1921   MCV 91.0 08/18/2014 1339   MCV 94.1 06/18/2014 1921   MCH 29.0 08/18/2014 1339   MCH 29.6 06/18/2014 1921   MCHC 31.9 08/18/2014 1339   MCHC 31.5 06/18/2014 1921   RDW 17.7* 08/18/2014 1339   RDW 18.5* 06/18/2014 1921   LYMPHSABS 0.4* 08/18/2014 1339   LYMPHSABS 0.4* 06/18/2014 1921   MONOABS 0.7 08/18/2014 1339   MONOABS 0.2 06/18/2014 1921   EOSABS 0.0 08/18/2014 1339   EOSABS 0.1 06/18/2014 1921   BASOSABS 0.1 08/18/2014 1339   BASOSABS 0.0 06/18/2014 1921   CMP     Component Value Date/Time   NA 140 08/18/2014 1339   NA 140 08/13/2014 1512   K 3.6 08/18/2014 1339   K 3.9 08/13/2014 1512   CL 101 08/13/2014 1512   CL 101 02/11/2013 0950   CO2 27 08/18/2014 1339   CO2 27 08/13/2014 1512   GLUCOSE 144* 08/18/2014 1339   GLUCOSE 126*  08/13/2014 1512   GLUCOSE 143* 02/11/2013 0950   BUN 28.8* 08/18/2014 1339   BUN 33* 08/13/2014 1512   CREATININE 1.6* 08/18/2014 1339   CREATININE 1.76* 08/13/2014 1512   CREATININE 2.41* 06/28/2014 0415   CALCIUM 9.1 08/18/2014 1339   CALCIUM 8.5 08/13/2014 1512   CALCIUM 6.6* 09/29/2012 1118   PROT 6.7 08/18/2014 1339   PROT 6.9 06/18/2014 1921   ALBUMIN 3.3* 08/18/2014 1339   ALBUMIN 3.4* 06/18/2014 1921   AST 20 08/18/2014 1339   AST 22 06/18/2014 1921   ALT 20 08/18/2014 1339   ALT 24 06/18/2014 1921   ALKPHOS 86 08/18/2014 1339    ALKPHOS 105 06/18/2014 1921   BILITOT 0.31 08/18/2014 1339   BILITOT <0.2* 06/18/2014 1921   GFRNONAA 20* 06/28/2014 0415   GFRAA 23* 06/28/2014 0415   Results for DANTE, COOTER (MRN [id number]) as of 03/05/2014 08:44  Ref. Range 12/30/2013 11:33  Iron Latest Range: 42-135 ug/dL 38 (L)  UIBC Latest Range: 125-400 ug/dL 265  TIBC Latest Range: 250-470 ug/dL 303  %SAT Latest Range: 21-57 % 12 (L)  Ferritin Latest Range: 10-291 ng/mL 139    RADIOGRAPHIC STUDIES: CT scan reviewed above  ASSESSMENT: Metastatic breast cancer on palliative letrozole plus Palbociclib and Xgeva for bone mets, with mild progression seen on skeletal system but not visceral disease.   PLAN:  1. Acute right distal oblique fracture (12/31/2013). --Likely secondary to number #3.  Resolved. We  started zometa q 4 weeks (first dose on 01/01/2014) to reduce further skeletal events.  Patient had declined previously.  She consented understanding indications, benefits and risks including but not limited to nephrotoxicity, hypocalcemia, osteoradionecrosis of the jaw.  Her arm is in a brace.  She has not received additional doses due to a rise in her creatinine which was likely multifactorial in nature.  Dr. Lona Kettle placed her on xgeva for bone pain control, reduce risk for pathological fractures and for anti-cancer properties in bones.    2. Bone Pain secondary #3, improving. --Started palliative XRT to left/right femur on 02/02/2014 per Dr. Sondra Come.  She  received 30 Gy in 10 fractions. It ended on 02/24/2014.  She reports moderate improvement.she follows again with Radiation Oncology on 07/12/14. To have further palliative radiation to her lumbar spine under the care of Dr. Sondra Come.  3.  Metastatic breast cancer (ER pos/PR pos/ HER2 neg) with spread to bone, lungs.  -Clinically, she has had a response with stable breast mass. However, her recent CT of Neck and arm x-rays show progressive bone lesions.  Further, her  CT of Chest  showed a new lung nodule.  She is tolerating letrozole well without bone/muscle pain, hot flash/night sweat.  We added palbociclib based on (Finn, 2015)  She takes 125 mg daily for 21 days then 7 days off with her letrozole daily.  Previously, we discussed extensively the indications, benefits and risks including but not limited to abnormal absolute lymphocyte count, neutropenia, leukopenia, thrombocytopenia, weakness, alopecia and fatigue and nausea.  She understood these risks and agree to proceed.   Given persistent low counts we recommended to START  2 weeks on followed by one week off of palbociclib.  Her CA 27.29 is stable. Last restaging scan shows moderate control of visceral disease but few bone lesions progressing like left hip, L3 vertebra and right humerus. Delton See will help. The patient will be seen by Dr. Burr Medico in one month with a restaging CT scan of the chest, abdomen and pelvis without  contrast to re-evaluate her disease, as well as repeat CBC differential, CMET aand CA27.29  Continue the current regimen of Femara/Palbociclib and Xgeva.  5. Stage III (T1N1M0) Laryngeal Carcinoma, probable.   --She has been seen by radiation oncology.  In the presence of Nurse Gerald Dexter, we reviewed her CT of neck as noted above and she agreed to further evaluation by Dr. Sondra Come.  She declined intervention presently.  She is being followed by Dr. Sondra Come.   6. Leukopenia/Anemia of chronic disease plus/minus iron defiency. -- Past work up for reversible causes of anemia were negative.  --Her WBC are low but stable.  This can in part be explained by her palbociclib.  We will monitor her labs.  7. Grade 2 dystolic dysfunction. --She is not fluid overloaded today on exam. She is on Lasix, nitrogylerin prn, losartan. Increase lasix prn and weight daily. She will follow p with her cardiologist in January 2016 as scheduled  8. Diabetes mellitus, type II. Diet controlled.   9. COPD: On albuterol prn. She remains on  home oxygen. She will follow up with her pulmonologist as scheduled in January 2016.  10. Chronic renal insufficiency. Using Xgeva because of safe renal profile.    11. Follow up: In about 1 month with CBC, CMP, CA 27 29. Next scan in January 2016. Next visit with Dr Burr Medico in Oncology who will take over her care.   All questions were answered. The patient knows to call the clinic with any problems, questions or concerns. We can certainly see the patient much sooner if necessary.  I spent 25 minutes counseling the patient face to face. The total time spent in the appointment was 35 minutes.

## 2014-08-18 NOTE — Patient Instructions (Signed)
Denosumab injection What is this medicine? DENOSUMAB (den oh sue mab) slows bone breakdown. Prolia is used to treat osteoporosis in women after menopause and in men. Xgeva is used to prevent bone fractures and other bone problems caused by cancer bone metastases. Xgeva is also used to treat giant cell tumor of the bone. This medicine may be used for other purposes; ask your health care provider or pharmacist if you have questions. COMMON BRAND NAME(S): Prolia, XGEVA What should I tell my health care provider before I take this medicine? They need to know if you have any of these conditions: -dental disease -eczema -infection or history of infections -kidney disease or on dialysis -low blood calcium or vitamin D -malabsorption syndrome -scheduled to have surgery or tooth extraction -taking medicine that contains denosumab -thyroid or parathyroid disease -an unusual reaction to denosumab, other medicines, foods, dyes, or preservatives -pregnant or trying to get pregnant -breast-feeding How should I use this medicine? This medicine is for injection under the skin. It is given by a health care professional in a hospital or clinic setting. If you are getting Prolia, a special MedGuide will be given to you by the pharmacist with each prescription and refill. Be sure to read this information carefully each time. For Prolia, talk to your pediatrician regarding the use of this medicine in children. Special care may be needed. For Xgeva, talk to your pediatrician regarding the use of this medicine in children. While this drug may be prescribed for children as young as 13 years for selected conditions, precautions do apply. Overdosage: If you think you've taken too much of this medicine contact a poison control center or emergency room at once. Overdosage: If you think you have taken too much of this medicine contact a poison control center or emergency room at once. NOTE: This medicine is only for  you. Do not share this medicine with others. What if I miss a dose? It is important not to miss your dose. Call your doctor or health care professional if you are unable to keep an appointment. What may interact with this medicine? Do not take this medicine with any of the following medications: -other medicines containing denosumab This medicine may also interact with the following medications: -medicines that suppress the immune system -medicines that treat cancer -steroid medicines like prednisone or cortisone This list may not describe all possible interactions. Give your health care provider a list of all the medicines, herbs, non-prescription drugs, or dietary supplements you use. Also tell them if you smoke, drink alcohol, or use illegal drugs. Some items may interact with your medicine. What should I watch for while using this medicine? Visit your doctor or health care professional for regular checks on your progress. Your doctor or health care professional may order blood tests and other tests to see how you are doing. Call your doctor or health care professional if you get a cold or other infection while receiving this medicine. Do not treat yourself. This medicine may decrease your body's ability to fight infection. You should make sure you get enough calcium and vitamin D while you are taking this medicine, unless your doctor tells you not to. Discuss the foods you eat and the vitamins you take with your health care professional. See your dentist regularly. Brush and floss your teeth as directed. Before you have any dental work done, tell your dentist you are receiving this medicine. Do not become pregnant while taking this medicine or for 5 months after stopping   it. Women should inform their doctor if they wish to become pregnant or think they might be pregnant. There is a potential for serious side effects to an unborn child. Talk to your health care professional or pharmacist for more  information. What side effects may I notice from receiving this medicine? Side effects that you should report to your doctor or health care professional as soon as possible: -allergic reactions like skin rash, itching or hives, swelling of the face, lips, or tongue -breathing problems -chest pain -fast, irregular heartbeat -feeling faint or lightheaded, falls -fever, chills, or any other sign of infection -muscle spasms, tightening, or twitches -numbness or tingling -skin blisters or bumps, or is dry, peels, or red -slow healing or unexplained pain in the mouth or jaw -unusual bleeding or bruising Side effects that usually do not require medical attention (Report these to your doctor or health care professional if they continue or are bothersome.): -muscle pain -stomach upset, gas This list may not describe all possible side effects. Call your doctor for medical advice about side effects. You may report side effects to FDA at 1-800-FDA-1088. Where should I keep my medicine? This medicine is only given in a clinic, doctor's office, or other health care setting and will not be stored at home. NOTE: This sheet is a summary. It may not cover all possible information. If you have questions about this medicine, talk to your doctor, pharmacist, or health care provider.  2015, Elsevier/Gold Standard. (2012-02-11 12:37:47)  

## 2014-08-18 NOTE — Telephone Encounter (Signed)
Gave avs & cal for Jan. °

## 2014-08-18 NOTE — Patient Instructions (Addendum)
Continue taking Femara and Ibrance at the current doses and frequency Follow up in 1 month for a another symptom management visit and Xgeva injection with a restaging CT scan of your chest, abdomen and pelvis to re-evaluate your disease.

## 2014-08-18 NOTE — Telephone Encounter (Signed)
pt refused barium

## 2014-08-19 LAB — CANCER ANTIGEN 27.29: CA 27.29: 40 U/mL — AB (ref 0–39)

## 2014-08-24 ENCOUNTER — Ambulatory Visit (INDEPENDENT_AMBULATORY_CARE_PROVIDER_SITE_OTHER): Payer: Commercial Managed Care - HMO | Admitting: Family Medicine

## 2014-08-24 ENCOUNTER — Encounter: Payer: Self-pay | Admitting: Family Medicine

## 2014-08-24 VITALS — BP 148/64 | HR 85 | Temp 98.0°F | Ht 62.0 in | Wt 246.9 lb

## 2014-08-24 DIAGNOSIS — I5032 Chronic diastolic (congestive) heart failure: Secondary | ICD-10-CM

## 2014-08-24 DIAGNOSIS — L98499 Non-pressure chronic ulcer of skin of other sites with unspecified severity: Secondary | ICD-10-CM

## 2014-08-24 DIAGNOSIS — L97919 Non-pressure chronic ulcer of unspecified part of right lower leg with unspecified severity: Secondary | ICD-10-CM

## 2014-08-24 DIAGNOSIS — E1169 Type 2 diabetes mellitus with other specified complication: Secondary | ICD-10-CM

## 2014-08-24 LAB — POCT GLYCOSYLATED HEMOGLOBIN (HGB A1C): HEMOGLOBIN A1C: 6.5

## 2014-08-25 NOTE — Assessment & Plan Note (Signed)
Unna boot applied today. Referral to wound care placed.

## 2014-08-25 NOTE — Assessment & Plan Note (Addendum)
Patient with acute exacerbation of underlying chronic diastolic heart failure. This continues to be  difficult to control. I advised the patient to increase her torsemide to 80 mg twice a day and follow-up closely with myself and/or cardiology.  I also advised compression and elevation of her legs as I feel that stasis and immobility is contributing as well to her worsening edema.

## 2014-08-25 NOTE — Progress Notes (Signed)
   Subjective:    Patient ID: Anita [name], female    DOB: [birth year], 68 y.o.   MRN: [id number]  HPI 68 year old female with extensive past medical history including anemia chronic disease, coronary artery disease status post NSTEMI x 3, CKD III, COPD, chronic respiratory failure on home O2, diet-controlled diabetes mellitus, hyperlipidemia, and chronic diastolic heart failure presents for follow-up.  1) CHF, Diastolic  Patient with multiple admissions during 2015.  Patient reports that her shortness of breath is stable today.  Patient does note continued and worsening lower extremity edema.  She also has lower extremity wounds as well.  She has been following up closely with cardiology.  Per the most recent notes she has been resistant to increasing her diuretic.  She is currently taking torsemide 80 mg in the morning and 40 mg at night and continues to experience significant lower extremity edema.  She denies chest pain and states that she is breathing well.  2) LE wound  Patient reports that she has a wound at her right heel that her daughter has been dressing at home.  She is unclear how long it's been present.  This is worsened with the lower extremity edema.  She states that she is not followed by wound care.  No reports of fevers, chills, nausea, vomiting. She does note that the area is draining.  Review of Systems Per HPI    Objective:   Physical Exam Filed Vitals:   08/24/14 1625  BP: 148/64  Pulse: 85  Temp: 98 F (36.7 C)   Exam: General: Chronically ill-appearing obese female in no acute distress. Cardiovascular: RRR. No murmur. Respiratory: Bibasilar crackles noted. No increased work of breathing. Extremities: 3+ lower extremity Skin: small ulceration noted at the posterior ankle (area of the distal Achilles).  Purulent drainage noted.  Mild bleeding noted.  Difficult to get a good assessment in setting of severe edema and also due to patient  positioning (patient unable to get on exam table).    Assessment & Plan:  See Problem List

## 2014-09-01 ENCOUNTER — Ambulatory Visit (HOSPITAL_COMMUNITY)
Admission: RE | Admit: 2014-09-01 | Discharge: 2014-09-01 | Disposition: A | Discharge status: Home / Self Care | Payer: Commercial Managed Care - HMO | Source: Ambulatory Visit | Attending: Physician Assistant | Admitting: Physician Assistant

## 2014-09-01 ENCOUNTER — Encounter (HOSPITAL_COMMUNITY): Payer: Self-pay

## 2014-09-01 ENCOUNTER — Ambulatory Visit: Admission: RE | Admit: 2014-09-01 | Payer: Medicare HMO | Source: Ambulatory Visit | Admitting: Radiation Oncology

## 2014-09-01 ENCOUNTER — Inpatient Hospital Stay
Admission: RE | Admit: 2014-09-01 | Payer: Commercial Managed Care - HMO | Source: Ambulatory Visit | Admitting: Radiation Oncology

## 2014-09-01 DIAGNOSIS — C7800 Secondary malignant neoplasm of unspecified lung: Secondary | ICD-10-CM | POA: Insufficient documentation

## 2014-09-01 DIAGNOSIS — C50911 Malignant neoplasm of unspecified site of right female breast: Secondary | ICD-10-CM | POA: Insufficient documentation

## 2014-09-01 DIAGNOSIS — C7951 Secondary malignant neoplasm of bone: Secondary | ICD-10-CM | POA: Diagnosis not present

## 2014-09-01 DIAGNOSIS — Z79899 Other long term (current) drug therapy: Secondary | ICD-10-CM | POA: Insufficient documentation

## 2014-09-02 ENCOUNTER — Encounter (HOSPITAL_BASED_OUTPATIENT_CLINIC_OR_DEPARTMENT_OTHER): Payer: Commercial Managed Care - HMO | Attending: Internal Medicine

## 2014-09-02 DIAGNOSIS — L97329 Non-pressure chronic ulcer of left ankle with unspecified severity: Secondary | ICD-10-CM | POA: Insufficient documentation

## 2014-09-02 DIAGNOSIS — L97319 Non-pressure chronic ulcer of right ankle with unspecified severity: Secondary | ICD-10-CM | POA: Insufficient documentation

## 2014-09-02 DIAGNOSIS — I87333 Chronic venous hypertension (idiopathic) with ulcer and inflammation of bilateral lower extremity: Secondary | ICD-10-CM | POA: Diagnosis not present

## 2014-09-03 ENCOUNTER — Telehealth: Payer: Self-pay | Admitting: Family Medicine

## 2014-09-03 NOTE — Consult Note (Signed)
NAME:  Anita [name], Anita [name] NO.:  [id number]  MEDICAL RECORD NO.:  [id number]  LOCATION:  FOOT                         FACILITY:  [facility]  PHYSICIAN:  Ricard Dillon, M.D.DATE OF BIRTH:  08-26-1946  DATE OF CONSULTATION: DATE OF DISCHARGE:                                CONSULTATION   Anita [name] is here for our review of left lower extremity wounds greater than right lower extremity wounds.  She states these have been here for roughly 2 weeks.  There was no trauma and she has no prior wound history.  Her major issues seemed to be a chronic diastolic heart failure for which she has had multiple admissions in the last month. She states she has gained 9 pounds and had a diuretic adjustment last month I think this is mostly followed by her cardiologist.  She does have stockings but finds a slip down on her legs and actually may have been part of the reason for her lower extremity wounds.  PAST MEDICAL HISTORY: 1. Diastolic heart failure.  The last echocardiogram I see on [place] from June showed good left ventricular and right     ventricular function. 2. Type 2 diabetes, not on any current treatment. 3. Hyperlipidemia. 4. Coronary artery disease. 5. COPD on chronic oxygen. 6. History of breast cancer with metastasis to the bone. 7. Hypertension.  CURRENT MEDICATIONS: 1. Lipitor 40 daily. 2. Imdur 60 daily. 3. Zaroxolyn 2.5 every other day. 4. Remeron 15 mg at bedtime. 5. Nitrostat p.r.n. 6. Ibrance 125 daily. 7. K-Dur 20 daily. 8. Spiriva 18 mcg daily. 9. Demadex 20 mg b.i.d.  PHYSICAL EXAMINATION:  VITAL SIGNS:  Temperature is 98.8, pulse 95, respirations 16, blood pressure 108/44.  ABIs as calculated in this clinic shows 1.35 on the right, 1.29 on the left. RESPIRATORY:  Shallow but clear air entry bilaterally. CARDIAC:  Sounds were normal.  There are no murmurs.  No S3.  JVP is not elevated. EXTREMITIES:  She has chronic significant  bilateral erythema in her lower legs suggestive of significant chronic venous insufficiency.  She had 3 wound areas on the left leg including the left anterior, left medial, and a linear wound across the left ankle.  This may have been a wrap injury from her stockings that fell down her leg.  The right lateral leg had similar brawny erythema secondary to chronic venous stasis.  There was some weeping but only a small open area on the lateral leg.  IMPRESSION: 1. Severe chronic venous insufficiency with chronic bilateral erythema     and inflammation.  The edema in her legs may be multifactorial     although I could not pick up on any evidence of right or left     ventricular heart failure at the bedside.  The edema does not go     above her legs.  We addressed all of these wound areas with silver     alginate, TCA alignment to the erythematous areas, and put her in     bilateral Profore Lites.  We advised her that should be snug but     should not hurt.  If there are problems with pain, she is to remove     the dressings and call us. 2. Elevated ABIs in the clinic suggesting noncompressible vessels.  I     do not see arterial Dopplers in the chart although there were     venous Dopplers done in June of this year.  She will be seen again in a week's time.          ______________________________ Ricard Dillon, M.D.     MGR/MEDQ  D:  09/02/2014  T:  09/03/2014  Job:  [id number]

## 2014-09-03 NOTE — Telephone Encounter (Signed)
[facility] Ne[facility] [employer] Referral Prior one has expired  appt is with Dr Halford Chessman  09-08-14 4:15 [phone] Diagnosis: J 41.0  J96.11

## 2014-09-03 NOTE — Telephone Encounter (Signed)
Spoke with sheri and gave the [facility] referral auth.

## 2014-09-08 ENCOUNTER — Encounter: Payer: Self-pay | Admitting: Pulmonary Disease

## 2014-09-08 ENCOUNTER — Ambulatory Visit (INDEPENDENT_AMBULATORY_CARE_PROVIDER_SITE_OTHER): Payer: Commercial Managed Care - HMO | Admitting: Pulmonary Disease

## 2014-09-08 VITALS — BP 118/78 | HR 92 | Ht 62.0 in | Wt 238.6 lb

## 2014-09-08 DIAGNOSIS — J9611 Chronic respiratory failure with hypoxia: Secondary | ICD-10-CM

## 2014-09-08 DIAGNOSIS — J410 Simple chronic bronchitis: Secondary | ICD-10-CM

## 2014-09-08 NOTE — Progress Notes (Signed)
Chief Complaint  Patient presents with  . Follow-up    Pt reports having SOB with exertion and leg swelling. Reports that Spiriva is working well to control symptoms.     History of Present Illness: Anita [name] is a 69 y.o. female former smoker with COPD and possible sleep apnea using supplemental oxygen at night.  She also has history of stage IV breast cancer, laryngeal cancer, and diastolic CHF.  She stopped using spiriva two weeks ago, and has not noticed any difference.  She gets occasional cough and clear sputum.  She denies fever, wheeze, or chest pain.  She gets winded with strenuous exertion, but does okay with slow, steady pace.  She uses her oxygen with activity and at night.  Reviewed her CT chest from earlier this month.  TESTS: Echo 09/25/12 >> grade 2 diastolic dysfunction, mild RV dilation, EF 55% CT chest 10/08/12 >> 6 mm GGO nodule RUL, diffuse sclerotic changes on bone windows ONO with RA 10/23/12 >> Test time 7 hrs 53 min. Mean SpO2 89.3%, low SpO2 66%. Spent 46 min with SpO2 < 88%. CT chest 09/01/14 >> 5 mm RUL nodule, 5 mm LUL nodule, multiple lytic lesions in spine/sternum/shoulder  PMHx >> CAD, Diastolic CHF, HLD, CKD, DM, Depression, Stage IV breast cancer, Stage III laryngeal carcinoma  PSHx, Medications, Allergies, Fhx, Shx reviewed.   Physical Exam: Blood pressure 118/78, pulse 92, height 5\' 2"  (1.575 m), weight 238 lb 9.6 oz (108.228 kg), SpO2 96 %. Body mass index is 43.63 kg/(m^2).  General - No distress ENT - No sinus tenderness, MP 4, no oral exudate, no LAN, hoarse voice Cardiac - s1s2 regular, no murmur Chest - No wheeze/rales/dullness Back - No focal tenderness Abd - Soft, non-tender Ext - 2+ leg edema Neuro - Normal strength Skin - no rashes Psych - normal mood, and behavior  CBC Latest Ref Rng 08/18/2014 07/21/2014 06/18/2014  WBC 3.9 - 10.3 10e3/uL 4.3 2.7(L) 4.5  Hemoglobin 11.6 - 15.9 g/dL 8.7(L) 8.3(L) 8.5(L)  Hematocrit 34.8 -  46.6 % 27.3(L) 25.7(L) 27.0(L)  Platelets 145 - 400 10e3/uL 272 168 250    CMP Latest Ref Rng 08/18/2014 08/13/2014 07/21/2014  Glucose 70 - 140 mg/dl 144(H) 126(H) 164(H)  BUN 7.0 - 26.0 mg/dL 28.8(H) 33(H) 42.9(H)  Creatinine 0.6 - 1.1 mg/dL 1.6(H) 1.76(H) 1.8(H)  Sodium 136 - 145 mEq/L 140 140 142  Potassium 3.5 - 5.1 mEq/L 3.6 3.9 3.5  Chloride 96 - 112 mEq/L - 101 -  CO2 22 - 29 mEq/L 27 27 24   Calcium 8.4 - 10.4 mg/dL 9.1 8.5 8.9  Total Protein 6.4 - 8.3 g/dL 6.7 - 6.5  Total Bilirubin 0.20 - 1.20 mg/dL 0.31 - 0.45  Alkaline Phos 40 - 150 U/L 86 - 85  AST 5 - 34 U/L 20 - 13  ALT 0 - 55 U/L 20 - 17     Assessment/Plan:  Chronic bronchitis. Intolerant of anoro due to palpitations. Plan: - she has not felt any difference of spiriva >> will not renew this  Chronic hypoxic respiratory failure likely related obesity/hypoventilation syndrome. Plan: - continue supplemental oxygen with exertion and sleep  Dyspnea related to bronchitis, obesity, deconditioning, anemia, and diastolic CHF. Plan: - encouraged her to maintain her activity level as tolerated  Chesley Mires, MD  Pulmonary/Critical Care/Sleep Pager:  [phone] 09/08/2014, 4:42 PM

## 2014-09-08 NOTE — Patient Instructions (Signed)
Follow up in 1 year.

## 2014-09-09 DIAGNOSIS — I87333 Chronic venous hypertension (idiopathic) with ulcer and inflammation of bilateral lower extremity: Secondary | ICD-10-CM | POA: Diagnosis not present

## 2014-09-09 DIAGNOSIS — L97329 Non-pressure chronic ulcer of left ankle with unspecified severity: Secondary | ICD-10-CM | POA: Diagnosis not present

## 2014-09-09 DIAGNOSIS — L97319 Non-pressure chronic ulcer of right ankle with unspecified severity: Secondary | ICD-10-CM | POA: Diagnosis not present

## 2014-09-10 ENCOUNTER — Encounter: Payer: Self-pay | Admitting: Cardiology

## 2014-09-10 ENCOUNTER — Ambulatory Visit (INDEPENDENT_AMBULATORY_CARE_PROVIDER_SITE_OTHER): Payer: Commercial Managed Care - HMO | Admitting: Cardiology

## 2014-09-10 VITALS — BP 112/56 | HR 88 | Ht 62.0 in | Wt 240.0 lb

## 2014-09-10 DIAGNOSIS — R0602 Shortness of breath: Secondary | ICD-10-CM

## 2014-09-10 NOTE — Patient Instructions (Signed)
Your physician recommends that you schedule a follow-up appointment in: 2 months with Dr. Hochrein  

## 2014-09-10 NOTE — Progress Notes (Signed)
HPI  The patient presents for follow up after a hospitalization for treatment of dyspnea and anasarca. She has had multiple admissions for this with difficulty managing her as an outpatient despite her compliance and understanding. She consented to hospitalization last fall most recently for diuresis and has been here once for follow-up since that time.  Since that time she's been out of the hospital. She actually thinks she is doing well. She does have chronic oxygen. She has a home Psychologist, counselling. She's had follow-up in the wound clinic which seems to have made a significant difference. She thinks her leg swelling is reduced. She's not having any overt shortness of breath, PND or orthopnea. She sleeps in a recliner. She's not having any chest pressure, neck or arm discomfort.   Allergies  Allergen Reactions  . Omnipaque [Iohexol]     Pt claims she developed hives after given contrast  . Benzene Rash    Current Outpatient Prescriptions  Medication Sig Dispense Refill  . aspirin EC 81 MG EC tablet Take 1 tablet (81 mg total) by mouth daily. 30 tablet 3  . atorvastatin (LIPITOR) 40 MG tablet Take 40 mg by mouth daily.    . clotrimazole (LOTRIMIN) 1 % cream Apply 1 application topically daily. 30 g 1  . isosorbide mononitrate (IMDUR) 60 MG 24 hr tablet Take 1 tablet (60 mg total) by mouth daily. 30 tablet 5  . letrozole (FEMARA) 2.5 MG tablet Take 1 tablet (2.5 mg total) by mouth daily. 90 tablet 0  . metolazone (ZAROXOLYN) 2.5 MG tablet Take 2.5 mg by mouth as needed.     . mirtazapine (REMERON) 15 MG tablet Take 1 tablet (15 mg total) by mouth at bedtime. 30 tablet 5  . nitroGLYCERIN (NITROSTAT) 0.4 MG SL tablet Place 1 tablet (0.4 mg total) under the tongue every 5 (five) minutes as needed for chest pain. 30 tablet 0  . NON FORMULARY Place 3 L into the nose as needed (oxygen).     . palbociclib (IBRANCE) 125 MG capsule Take 125 mg by mouth See admin instructions. Take 125mg  every  morning for 2 weeks, hold for a week, then repeat. Take whole with food.    . potassium chloride SA (K-DUR,KLOR-CON) 20 MEQ tablet Take 1 tablet (20 mEq total) by mouth daily. 60 tablet 5  . torsemide (DEMADEX) 20 MG tablet Take 2-4 tablets (40-80 mg total) by mouth 2 (two) times daily. 80mg  in the morning and 40mg  in the evening 120 tablet 3  . VITAMIN D, CHOLECALCIFEROL, PO Take 1,000 mg by mouth daily.      No current facility-administered medications for this visit.    Past Medical History  Diagnosis Date  . Fibroid   . Morbid obesity   . 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 COPD (chronic obstructive pulmonary disease)   . Chronic diastolic CHF (congestive heart failure)   . NSTEMI (non-ST elevated myocardial infarction)     Cath November 2014 LAD 40% stenosis, first diagonal 80% stenosis, circumflex 20% stenosis, right coronary artery occluded. The EF was 35-40% at that time.  . Anemia     a. Adm 09/2012 with melena, Hgb 5.8 -> transfused. EGD/colonoscopy unrevealing.  . Breast cancer     a. Mets to bone. ER 100%/ PR 0%/Her2 neu negative.  [name] Ulcers of both lower legs   . Tobacco abuse   . Hyperlipidemia 02/11/2013  . Chronic respiratory failure  a. On O2 qhs. also portable O2.  . Chronic renal insufficiency   . Lesion of vocal cord     a. CT 05/2013 concerning for tumor.  . Shortness of breath   . Depression   . Diabetes mellitus without complication   . Anginal pain   . Dementia     very mild  . Radiation 02/02/14-02/24/14    left and right femur 30 gray    Past Surgical History  Procedure Laterality Date  . Colonoscopy, esophagogastroduodenoscopy (egd) and esophageal dilation N/A 10/13/2012    Procedure: colonoscopy and egd;  Surgeon: Wonda Horner, MD;  Location: [facility];  Service: Endoscopy;  Laterality: N/A;  . Left heart catheterization with coronary angiogram N/A 07/07/2013    Procedure: LEFT HEART CATHETERIZATION WITH  CORONARY ANGIOGRAM;  Surgeon: Peter M Martinique, MD;  Location: [facility] CATH LAB;  Service: Cardiovascular;  Laterality: N/A;    ROS:  As stated in the HPI and negative for all other systems.  PHYSICAL EXAM BP 112/56 mmHg  Pulse 88  Ht $R'5\' 2"'Jq$  (1.575 m)  Wt 240 lb (108.863 kg)  BMI 43.89 kg/m2 GEN:  No distress NECK:  No jugular venous distention at 90 degrees, waveform within normal limits, carotid upstroke brisk and symmetric, no bruits, no thyromegaly LYMPHATICS:  No cervical adenopathy LUNGS:  Few basilar crackles and BACK:  No CVA tenderness CHEST:  Unremarkable HEART:  S1 and S2 within normal limits, no S3, no S4, no clicks, no rubs, distant heart sounds murmurs ABD:  Positive bowel sounds normal in frequency in pitch, no bruits, no rebound, no guarding, unable to assess midline mass or bruit with the patient seated. EXT:  2 plus pulses throughout, trace bilateral lower extremity edema, no cyanosis no clubbing SKIN:  No rashes no nodules NEURO:  Cranial nerves II through XII grossly intact, motor grossly intact throughout PSYCH:  Cognitively intact, oriented to person place and time   ASSESSMENT AND PLAN   [place] HF:  She seems to be near euvolemic today. Her legs are actually less swollen than previous. She seems to be well attended to by her wound clinic appointment sent primary care physician.  She's followed by oncology and pulmonary. I think she would benefit from home health nursing and possible to assist with frequent lab draws and possibly further wound care. We will try to arrange this. At this point she will continue with her current dosing of diuretic. She doesn't take the Zaroxolyn anymore because she is very worried about her kidneys. She has lab work drawn next week.  CAD:  The patient has no new sypmtoms.  No further cardiovascular testing is indicated.  We will continue with aggressive risk reduction and meds as listed.

## 2014-09-16 ENCOUNTER — Telehealth: Payer: Self-pay | Admitting: Hematology

## 2014-09-16 IMAGING — CR DG CHEST 2V
1 series · 1 of 1 positions shown · non-contrast
Comparison: February 03, 2014.

CLINICAL DATA: Bilateral lower extremity swelling.

EXAM:
CHEST  2 VIEW

[w chest lat *]
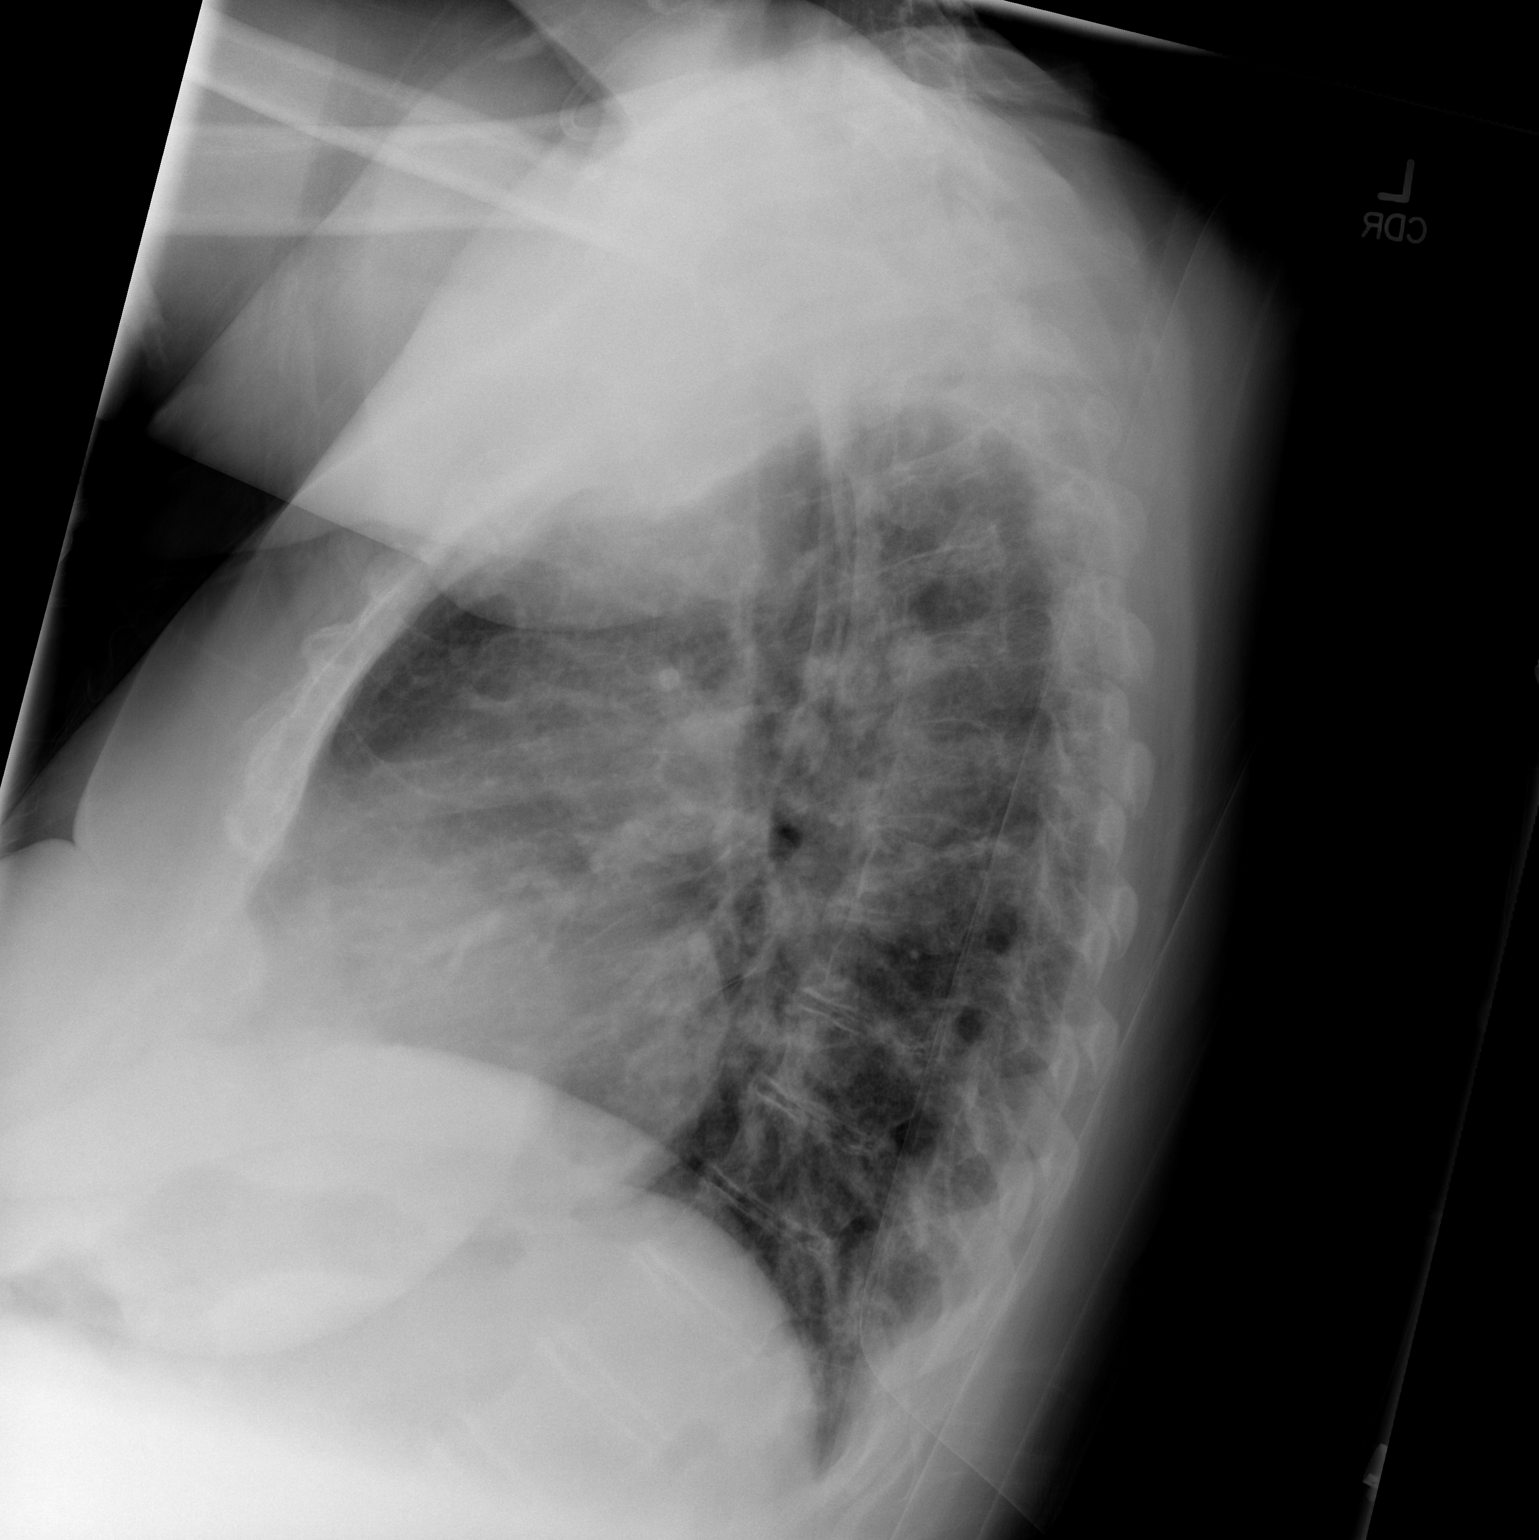

[1 of 1 positions shown; findings below may reference images not displayed]

FINDINGS: The heart size and mediastinal contours are within normal limits.
Both lungs are clear. No pneumothorax or pleural effusion is noted.
Bone lucencies are again noted in the distal right clavicle and
acromion consistent with a history of multiple myeloma.
IMPRESSION: No acute cardiopulmonary abnormality seen.

## 2014-09-16 NOTE — Telephone Encounter (Signed)
Return patient voicemail to reschedule appt from 01/21 to 02/05 due to expected weather 01/21 and no available on 01/29 Pt wanted to wait till to 02/05 due to transportation and can not come any other day.

## 2014-09-17 ENCOUNTER — Other Ambulatory Visit: Payer: Commercial Managed Care - HMO

## 2014-09-17 ENCOUNTER — Ambulatory Visit: Payer: Commercial Managed Care - HMO | Admitting: Hematology

## 2014-09-17 ENCOUNTER — Ambulatory Visit: Payer: Commercial Managed Care - HMO

## 2014-09-22 ENCOUNTER — Other Ambulatory Visit: Payer: Self-pay | Admitting: Family Medicine

## 2014-09-22 DIAGNOSIS — C50919 Malignant neoplasm of unspecified site of unspecified female breast: Secondary | ICD-10-CM

## 2014-09-22 NOTE — Telephone Encounter (Signed)
Needs refill on torsemide, letrozole and lotrimin. Pt says the letrozole should go to [facility] Pt was very hard to understand because she was slurring her words

## 2014-09-22 NOTE — Telephone Encounter (Signed)
Needs refill on tursomide [facility] [place]

## 2014-09-23 MED ORDER — CLOTRIMAZOLE 1 % EX CREA: 1.0000 "application " | TOPICAL_CREAM | Freq: Every day | CUTANEOUS | Status: DC

## 2014-09-23 MED ORDER — TORSEMIDE 20 MG PO TABS: 40.0000 mg | ORAL_TABLET | Freq: Two times a day (BID) | ORAL | Status: DC

## 2014-09-23 MED ORDER — LETROZOLE 2.5 MG PO TABS: 2.5000 mg | ORAL_TABLET | Freq: Every day | ORAL | Status: DC

## 2014-09-24 DIAGNOSIS — L97319 Non-pressure chronic ulcer of right ankle with unspecified severity: Secondary | ICD-10-CM | POA: Diagnosis not present

## 2014-09-24 DIAGNOSIS — L97329 Non-pressure chronic ulcer of left ankle with unspecified severity: Secondary | ICD-10-CM | POA: Diagnosis not present

## 2014-09-24 DIAGNOSIS — I87333 Chronic venous hypertension (idiopathic) with ulcer and inflammation of bilateral lower extremity: Secondary | ICD-10-CM | POA: Diagnosis not present

## 2014-09-24 LAB — GLUCOSE, CAPILLARY: Glucose-Capillary: 152 mg/dL — ABNORMAL HIGH (ref 70–99)

## 2014-09-28 ENCOUNTER — Other Ambulatory Visit: Payer: Self-pay | Admitting: *Deleted

## 2014-09-28 DIAGNOSIS — L0390 Cellulitis, unspecified: Secondary | ICD-10-CM

## 2014-09-30 ENCOUNTER — Telehealth: Payer: Self-pay | Admitting: Cardiology

## 2014-09-30 NOTE — Telephone Encounter (Signed)
Pt need new prescription for her Torsemide #90 and refills. Please call to [phone].Please call pt before you call this in,concerning how she takes this.

## 2014-10-01 ENCOUNTER — Ambulatory Visit (HOSPITAL_BASED_OUTPATIENT_CLINIC_OR_DEPARTMENT_OTHER): Payer: Commercial Managed Care - HMO

## 2014-10-01 ENCOUNTER — Other Ambulatory Visit (HOSPITAL_BASED_OUTPATIENT_CLINIC_OR_DEPARTMENT_OTHER): Payer: Commercial Managed Care - HMO

## 2014-10-01 ENCOUNTER — Encounter (HOSPITAL_BASED_OUTPATIENT_CLINIC_OR_DEPARTMENT_OTHER): Payer: Commercial Managed Care - HMO | Attending: Internal Medicine

## 2014-10-01 ENCOUNTER — Encounter: Payer: Self-pay | Admitting: Hematology

## 2014-10-01 ENCOUNTER — Ambulatory Visit (HOSPITAL_BASED_OUTPATIENT_CLINIC_OR_DEPARTMENT_OTHER): Payer: Commercial Managed Care - HMO | Admitting: Hematology

## 2014-10-01 VITALS — BP 132/51 | HR 81 | Temp 98.6°F | Resp 21 | Ht 62.0 in | Wt 238.6 lb

## 2014-10-01 DIAGNOSIS — C7951 Secondary malignant neoplasm of bone: Principal | ICD-10-CM

## 2014-10-01 DIAGNOSIS — C50919 Malignant neoplasm of unspecified site of unspecified female breast: Secondary | ICD-10-CM

## 2014-10-01 DIAGNOSIS — C50911 Malignant neoplasm of unspecified site of right female breast: Secondary | ICD-10-CM | POA: Diagnosis not present

## 2014-10-01 DIAGNOSIS — L97519 Non-pressure chronic ulcer of other part of right foot with unspecified severity: Secondary | ICD-10-CM | POA: Diagnosis not present

## 2014-10-01 DIAGNOSIS — C7800 Secondary malignant neoplasm of unspecified lung: Secondary | ICD-10-CM | POA: Diagnosis not present

## 2014-10-01 DIAGNOSIS — D638 Anemia in other chronic diseases classified elsewhere: Secondary | ICD-10-CM

## 2014-10-01 DIAGNOSIS — I509 Heart failure, unspecified: Secondary | ICD-10-CM

## 2014-10-01 DIAGNOSIS — E119 Type 2 diabetes mellitus without complications: Secondary | ICD-10-CM

## 2014-10-01 DIAGNOSIS — L97329 Non-pressure chronic ulcer of left ankle with unspecified severity: Secondary | ICD-10-CM | POA: Diagnosis not present

## 2014-10-01 DIAGNOSIS — I87333 Chronic venous hypertension (idiopathic) with ulcer and inflammation of bilateral lower extremity: Secondary | ICD-10-CM | POA: Insufficient documentation

## 2014-10-01 DIAGNOSIS — D72819 Decreased white blood cell count, unspecified: Secondary | ICD-10-CM

## 2014-10-01 DIAGNOSIS — N189 Chronic kidney disease, unspecified: Secondary | ICD-10-CM

## 2014-10-01 DIAGNOSIS — J449 Chronic obstructive pulmonary disease, unspecified: Secondary | ICD-10-CM

## 2014-10-01 LAB — CBC WITH DIFFERENTIAL/PLATELET
BASO%: 0.6 % (ref 0.0–2.0)
BASOS ABS: 0 10*3/uL (ref 0.0–0.1)
EOS ABS: 0.1 10*3/uL (ref 0.0–0.5)
EOS%: 2.5 % (ref 0.0–7.0)
HEMATOCRIT: 29 % — AB (ref 34.8–46.6)
HEMOGLOBIN: 9.1 g/dL — AB (ref 11.6–15.9)
LYMPH#: 0.4 10*3/uL — AB (ref 0.9–3.3)
LYMPH%: 10.6 % — ABNORMAL LOW (ref 14.0–49.7)
MCH: 27.7 pg (ref 25.1–34.0)
MCHC: 31.4 g/dL — ABNORMAL LOW (ref 31.5–36.0)
MCV: 88.1 fL (ref 79.5–101.0)
MONO#: 0.2 10*3/uL (ref 0.1–0.9)
MONO%: 5.8 % (ref 0.0–14.0)
NEUT%: 80.5 % — ABNORMAL HIGH (ref 38.4–76.8)
NEUTROS ABS: 2.9 10*3/uL (ref 1.5–6.5)
Platelets: 287 10*3/uL (ref 145–400)
RBC: 3.29 10*6/uL — AB (ref 3.70–5.45)
RDW: 18.9 % — ABNORMAL HIGH (ref 11.2–14.5)
WBC: 3.6 10*3/uL — ABNORMAL LOW (ref 3.9–10.3)

## 2014-10-01 LAB — COMPREHENSIVE METABOLIC PANEL (CC13)
ALBUMIN: 3.3 g/dL — AB (ref 3.5–5.0)
ALT: 23 U/L (ref 0–55)
ANION GAP: 10 meq/L (ref 3–11)
AST: 26 U/L (ref 5–34)
Alkaline Phosphatase: 97 U/L (ref 40–150)
BUN: 29.7 mg/dL — AB (ref 7.0–26.0)
CHLORIDE: 102 meq/L (ref 98–109)
CO2: 29 mEq/L (ref 22–29)
Calcium: 9.5 mg/dL (ref 8.4–10.4)
Creatinine: 1.9 mg/dL — ABNORMAL HIGH (ref 0.6–1.1)
EGFR: 27 mL/min/{1.73_m2} — ABNORMAL LOW (ref >90)
Glucose: 186 mg/dl — ABNORMAL HIGH (ref 70–140)
Potassium: 4.2 mEq/L (ref 3.5–5.1)
Sodium: 142 mEq/L (ref 136–145)
Total Bilirubin: 0.32 mg/dL (ref 0.20–1.20)
Total Protein: 6.7 g/dL (ref 6.4–8.3)

## 2014-10-01 LAB — CANCER ANTIGEN 27.29: CA 27.29: 38 U/mL (ref 0–39)

## 2014-10-01 MED ORDER — DENOSUMAB 120 MG/1.7ML ~~LOC~~ SOLN
120.0000 mg | Freq: Once | SUBCUTANEOUS | Status: AC
  Administered 2014-10-01: 120 mg via SUBCUTANEOUS
  Filled 2014-10-01: qty 1.7

## 2014-10-01 NOTE — Patient Instructions (Signed)
Denosumab injection What is this medicine? DENOSUMAB (den oh sue mab) slows bone breakdown. Prolia is used to treat osteoporosis in women after menopause and in men. Xgeva is used to prevent bone fractures and other bone problems caused by cancer bone metastases. Xgeva is also used to treat giant cell tumor of the bone. This medicine may be used for other purposes; ask your health care provider or pharmacist if you have questions. COMMON BRAND NAME(S): Prolia, XGEVA What should I tell my health care provider before I take this medicine? They need to know if you have any of these conditions: -dental disease -eczema -infection or history of infections -kidney disease or on dialysis -low blood calcium or vitamin D -malabsorption syndrome -scheduled to have surgery or tooth extraction -taking medicine that contains denosumab -thyroid or parathyroid disease -an unusual reaction to denosumab, other medicines, foods, dyes, or preservatives -pregnant or trying to get pregnant -breast-feeding How should I use this medicine? This medicine is for injection under the skin. It is given by a health care professional in a hospital or clinic setting. If you are getting Prolia, a special MedGuide will be given to you by the pharmacist with each prescription and refill. Be sure to read this information carefully each time. For Prolia, talk to your pediatrician regarding the use of this medicine in children. Special care may be needed. For Xgeva, talk to your pediatrician regarding the use of this medicine in children. While this drug may be prescribed for children as young as 13 years for selected conditions, precautions do apply. Overdosage: If you think you've taken too much of this medicine contact a poison control center or emergency room at once. Overdosage: If you think you have taken too much of this medicine contact a poison control center or emergency room at once. NOTE: This medicine is only for  you. Do not share this medicine with others. What if I miss a dose? It is important not to miss your dose. Call your doctor or health care professional if you are unable to keep an appointment. What may interact with this medicine? Do not take this medicine with any of the following medications: -other medicines containing denosumab This medicine may also interact with the following medications: -medicines that suppress the immune system -medicines that treat cancer -steroid medicines like prednisone or cortisone This list may not describe all possible interactions. Give your health care provider a list of all the medicines, herbs, non-prescription drugs, or dietary supplements you use. Also tell them if you smoke, drink alcohol, or use illegal drugs. Some items may interact with your medicine. What should I watch for while using this medicine? Visit your doctor or health care professional for regular checks on your progress. Your doctor or health care professional may order blood tests and other tests to see how you are doing. Call your doctor or health care professional if you get a cold or other infection while receiving this medicine. Do not treat yourself. This medicine may decrease your body's ability to fight infection. You should make sure you get enough calcium and vitamin D while you are taking this medicine, unless your doctor tells you not to. Discuss the foods you eat and the vitamins you take with your health care professional. See your dentist regularly. Brush and floss your teeth as directed. Before you have any dental work done, tell your dentist you are receiving this medicine. Do not become pregnant while taking this medicine or for 5 months after stopping   it. Women should inform their doctor if they wish to become pregnant or think they might be pregnant. There is a potential for serious side effects to an unborn child. Talk to your health care professional or pharmacist for more  information. What side effects may I notice from receiving this medicine? Side effects that you should report to your doctor or health care professional as soon as possible: -allergic reactions like skin rash, itching or hives, swelling of the face, lips, or tongue -breathing problems -chest pain -fast, irregular heartbeat -feeling faint or lightheaded, falls -fever, chills, or any other sign of infection -muscle spasms, tightening, or twitches -numbness or tingling -skin blisters or bumps, or is dry, peels, or red -slow healing or unexplained pain in the mouth or jaw -unusual bleeding or bruising Side effects that usually do not require medical attention (Report these to your doctor or health care professional if they continue or are bothersome.): -muscle pain -stomach upset, gas This list may not describe all possible side effects. Call your doctor for medical advice about side effects. You may report side effects to FDA at 1-800-FDA-1088. Where should I keep my medicine? This medicine is only given in a clinic, doctor's office, or other health care setting and will not be stored at home. NOTE: This sheet is a summary. It may not cover all possible information. If you have questions about this medicine, talk to your doctor, pharmacist, or health care provider.  2015, Elsevier/Gold Standard. (2012-02-11 12:37:47)  

## 2014-10-01 NOTE — Progress Notes (Signed)
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 She started letrozole in 11/2012.  Added Palbociclib 125 mg daily for 21 days on and 7 days off on 01/01/2014 with disease progression. Dose modified to 2 weeks on 1 week off with cycle 3.   2. zometa added on 01/01/2014 for metastatic bone disease. Zometa held due to renal insufficiency and changed to [facility] because of skeletal disease progression and safe renal profile. 3. Started palliative XRT to the left and right femur (30 Gy in 10 fractions) on 02/02/2014 per Dr. Gery Pray which was finished on 02/24/2014.  INTERVAL HISTORY:  Anita [name] 69 y.o. female with a history of right breast cancer with metastases to the bone/lungs here for follow-up. She was previously under Dr. Renella Cunas care, who has left the practice.  Today, she came by herself.  She previously responded to palliative radiation well, bilateral hip pain are nearly resolved. She only has mild intermittent right hip pain when she change her position, she drinks ensure. She is on home oxygen as needed. She has been on Lasix for her heart failure by her cardiologist. She also follows up with supportive clinic for bilateral lower extremity wounds.    MEDICAL HISTORY: Past Medical History  Diagnosis Date  . Fibroid   . Morbid obesity   . 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 COPD (chronic obstructive pulmonary disease)   . Chronic diastolic CHF (congestive heart failure)   . NSTEMI (non-ST elevated myocardial infarction)     Cath November 2014 LAD 40% stenosis, first diagonal 80% stenosis, circumflex 20% stenosis, right coronary artery occluded. The EF was 35-40% at that  time.  . Anemia     a. Adm 09/2012 with melena, Hgb 5.8 -> transfused. EGD/colonoscopy unrevealing.  . Breast cancer     a. Mets to bone. ER 100%/ PR 0%/Her2 neu negative.  [name] Ulcers of both lower legs   . Tobacco abuse   . Hyperlipidemia 02/11/2013  . Chronic respiratory failure     a. On O2 qhs. also portable O2.  . Chronic renal insufficiency   . Lesion of vocal cord     a. CT 05/2013 concerning for tumor.  . Shortness of breath   . Depression   . Diabetes mellitus without complication   . Anginal pain   . Dementia     very mild  . Radiation 02/02/14-02/24/14    left and right femur 30 gray    ALLERGIES:  is allergic to omnipaque and benzene.  MEDICATIONS:    Medication List       This list is accurate as of: 10/01/14  6:04 PM.  Always use your most recent med list.               aspirin 81 MG EC tablet  Take 1 tablet (81 mg total) by mouth daily.     atorvastatin 40 MG tablet  Commonly known as:  LIPITOR  Take 40 mg by mouth daily.     clotrimazole 1 % cream  Commonly known as:  LOTRIMIN  Apply 1 application topically daily.     IBRANCE 125 MG capsule  Generic drug:  palbociclib  Take  125 mg by mouth See admin instructions. Take 163m every morning for 2 weeks, hold for a week, then repeat. Take whole with food.     isosorbide mononitrate 60 MG 24 hr tablet  Commonly known as:  IMDUR  Take 1 tablet (60 mg total) by mouth daily.     letrozole 2.5 MG tablet  Commonly known as:  FEMARA  Take 1 tablet (2.5 mg total) by mouth daily.     mirtazapine 15 MG tablet  Commonly known as:  REMERON  Take 1 tablet (15 mg total) by mouth at bedtime.     nitroGLYCERIN 0.4 MG SL tablet  Commonly known as:  NITROSTAT  Place 1 tablet (0.4 mg total) under the tongue every 5 (five) minutes as needed for chest pain.     NON FORMULARY  Place 3 L into the nose as needed (oxygen).     potassium chloride SA 20 MEQ tablet  Commonly known as:  K-DUR,KLOR-CON  Take 1 tablet (20  mEq total) by mouth daily.     torsemide 20 MG tablet  Commonly known as:  DEMADEX  Take 2-4 tablets (40-80 mg total) by mouth 2 (two) times daily. 825min the morning and 4032mn the evening     VITAMIN D (CHOLECALCIFEROL) PO  Take 1,000 mg by mouth daily.        SURGICAL HISTORY:  Past Surgical History  Procedure Laterality Date  . Colonoscopy, esophagogastroduodenoscopy (egd) and esophageal dilation N/A 10/13/2012    Procedure: colonoscopy and egd;  Surgeon: SalWonda HornerD;  [facility];  Laterality: N/A;  . Left heart catheterization with coronary angiogram N/A 07/07/2013    Procedure: LEFT HEART CATHETERIZATION WITH CORONARY ANGIOGRAM;  Surgeon: Peter M JorMartiniqueD;  [facility];  Service: Cardiovascular;  Laterality: N/A;   REVIEW OF SYSTEMS:   Constitutional: Denies fevers, chills or abnormal weight loss Eyes: Denies blurriness of vision Ears, nose, mouth, throat, and face: Denies mucositis or sore throat Respiratory: Denies cough, dyspnea or wheezes Cardiovascular: Denies palpitation, chest discomfort or lower extremity swelling Gastrointestinal:  Denies nausea, heartburn or change in bowel habits Skin: Denies abnormal skin rashes Lymphatics: Denies new lymphadenopathy or easy bruising Neurological:Denies numbness, tingling or new weaknesses Behavioral/Psych: Mood is stable, no new changes  All other systems were reviewed with the patient and are negative.  PHYSICAL EXAMINATION: ECOG PERFORMANCE STATUS: 1  Blood pressure 132/51, pulse 81, temperature 98.6 F (37 C), temperature source Oral, resp. rate 21, height _0  (1.575 m), weight 238 lb 9.6 oz (108.228 kg), SpO2 99 %.  GENERAL:alert, no distress and comfortable morbidly obese woman, chronically ill appearing SKIN: skin color, texture, turgor are normal; posterior legs with irritation and erythema with a small abrasion on the posterior left thigh with mild TTP.  EYES:  normal, Conjunctiva are pink and non-injected, sclera clear OROPHARYNX:no exudate, no erythema and lips, buccal mucosa, and tongue normal  NECK: supple, thyroid normal size, non-tender, without nodularity LYMPH:  no palpable lymphadenopathy in the cervical, axillary LUNGS: coarse breathing with slightly increased breathing rate HEART: regular rate & rhythm and no murmurs and 2+ lower extremity edema BREAST: deferred ABDOMEN:abdomen soft, non-tender and normal bowel sounds; obese Musculoskeletal:no cyanosis of digits and no clubbing; bilateral lower extremities are wrapped with gauze and a bandage up to below knee. NEURO: alert & oriented x 3 with fluent speech, no focal motor/sensory deficits  LABORATORY DATA: CBC Latest Ref Rng 10/01/2014 08/18/2014 07/21/2014  WBC 3.9 - 10.3 10e3/uL 3.6(L) 4.3 2.7(L)  Hemoglobin 11.6 - 15.9 g/dL 9.1(L) 8.7(L) 8.3(L)  Hematocrit 34.8 - 46.6 % 29.0(L) 27.3(L) 25.7(L)  Platelets 145 - 400 10e3/uL 287 272 168    CMP Latest Ref Rng 10/01/2014 08/18/2014 08/13/2014  Glucose 70 - 140 mg/dl 186(H) 144(H) 126(H)  BUN 7.0 - 26.0 mg/dL 29.7(H) 28.8(H) 33(H)  Creatinine 0.6 - 1.1 mg/dL 1.9(H) 1.6(H) 1.76(H)  Sodium 136 - 145 mEq/L 142 140 140  Potassium 3.5 - 5.1 mEq/L 4.2 3.6 3.9  Chloride 96 - 112 mEq/L - - 101  CO2 22 - 29 mEq/L _0 Calcium 8.4 - 10.4 mg/dL 9.5 9.1 8.5  Total Protein 6.4 - 8.3 g/dL 6.7 6.7 -  Total Bilirubin 0.20 - 1.20 mg/dL 0.32 0.31 -  Alkaline Phos 40 - 150 U/L 97 86 -  AST 5 - 34 U/L 26 20 -  ALT 0 - 55 U/L 23 20 -   CA 27.29  Status: Finalresult Visible to patient:  Not Released Nextappt: 10/15/2014 at 08:15 AM in Oncology ([facility]) Dx:  Breast cancer metastasized to bone, r...           Ref Range 9:49 AM  66moago  262mogo     CA 27.29 0 - 39 U/mL 38 40 (H) 29         RADIOGRAPHIC STUDIES:  CT chest abdomen and pelvis 09/01/2014 IMPRESSION: Chest Impression:  1. Stable exam of  thorax. 2. Several small smudgy pulmonary nodules are not changed. 3. Stable widespread bony metastasis pneumothorax.  Abdomen / Pelvis Impression:  1. Stable small periaortic retroperitoneal lymph node. 2. No evidence disease progression. 3. Stable widespread skeletal metastasis. 4. Left sided staghorn calculus again demonstrated. 5. Nodular focus in the right kidney at site of prior renal cyst is not changed  ASSESSMENT: Metastatic breast cancer on palliative letrozole plus Palbociclib and Xgeva for bone mets.   PLAN:  1.  Metastatic breast cancer (ER pos/PR pos/ HER2 neg) with metastasis to bones and lungs.  --She is clinically stable, metaphyses related to bone pain much improved after palliating of radiation. -I reviewed her restaging CT chest, abdomen and pelvis. It showed stable disease overall. -Continue letrozole and Palbociclib 125 mg daily, 2 week on, 1 week off. We'll follow up his CBC closely.  2.  bone metastasis - continue Xgeva monthly   3. 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   --She has been seen by radiation oncology. She has declined intervention.  She is being followed by Dr. KiSondra Come  6. Leukopenia/Anemia of chronic disease plus/minus iron defiency. -- Past work up for reversible causes of anemia were negative.  --Her WBC are low but stable.  This can in part be explained by her palbociclib.  We will monitor her labs.  7. CHF --She is not fluid overloaded today on exam. She is on Lasix, nitrogylerin prn, losartan. Increase lasix prn and weight daily. Cards follow up.   8. Diabetes mellitus, type II. Diet controlled.   9. COPD: On albuterol prn. She remains on home oxygen.  10. Chronic renal insufficiency.  -Stable.   All questions were answered. The patient knows to call the clinic with any problems, questions or concerns. We can certainly see the patient much sooner if necessary.  Plan: -RTC with lab in 2 weeks before next cycle of  palbociclib  -iron study, cbc, CMP   I spent 25 minutes counseling the patient face to face.  The total time spent in the appointment was 30 minutes.   Truitt Merle  10/01/2014

## 2014-10-04 ENCOUNTER — Other Ambulatory Visit: Payer: Self-pay | Admitting: *Deleted

## 2014-10-04 MED ORDER — TORSEMIDE 20 MG PO TABS: 40.0000 mg | ORAL_TABLET | Freq: Two times a day (BID) | ORAL | Status: DC

## 2014-10-15 ENCOUNTER — Ambulatory Visit (HOSPITAL_BASED_OUTPATIENT_CLINIC_OR_DEPARTMENT_OTHER): Payer: Commercial Managed Care - HMO

## 2014-10-15 ENCOUNTER — Other Ambulatory Visit (HOSPITAL_BASED_OUTPATIENT_CLINIC_OR_DEPARTMENT_OTHER): Payer: Commercial Managed Care - HMO

## 2014-10-15 ENCOUNTER — Ambulatory Visit (HOSPITAL_BASED_OUTPATIENT_CLINIC_OR_DEPARTMENT_OTHER): Payer: Commercial Managed Care - HMO | Admitting: Hematology

## 2014-10-15 ENCOUNTER — Encounter: Payer: Self-pay | Admitting: Hematology

## 2014-10-15 ENCOUNTER — Other Ambulatory Visit: Payer: Self-pay | Admitting: Hematology

## 2014-10-15 ENCOUNTER — Other Ambulatory Visit: Payer: Self-pay | Admitting: *Deleted

## 2014-10-15 ENCOUNTER — Ambulatory Visit (HOSPITAL_COMMUNITY)
Admission: RE | Admit: 2014-10-15 | Discharge: 2014-10-15 | Disposition: A | Discharge status: Home / Self Care | Payer: Commercial Managed Care - HMO | Source: Ambulatory Visit | Attending: Hematology | Admitting: Hematology

## 2014-10-15 ENCOUNTER — Telehealth: Payer: Self-pay | Admitting: Hematology

## 2014-10-15 VITALS — BP 127/53 | HR 110 | Temp 99.1°F | Resp 19 | Ht 62.0 in | Wt 246.1 lb

## 2014-10-15 DIAGNOSIS — C50919 Malignant neoplasm of unspecified site of unspecified female breast: Secondary | ICD-10-CM

## 2014-10-15 DIAGNOSIS — C7951 Secondary malignant neoplasm of bone: Secondary | ICD-10-CM | POA: Diagnosis not present

## 2014-10-15 DIAGNOSIS — I87333 Chronic venous hypertension (idiopathic) with ulcer and inflammation of bilateral lower extremity: Secondary | ICD-10-CM | POA: Diagnosis not present

## 2014-10-15 DIAGNOSIS — Z170 Estrogen receptor positive status [ER+]: Secondary | ICD-10-CM | POA: Diagnosis not present

## 2014-10-15 DIAGNOSIS — D72819 Decreased white blood cell count, unspecified: Secondary | ICD-10-CM

## 2014-10-15 DIAGNOSIS — D638 Anemia in other chronic diseases classified elsewhere: Secondary | ICD-10-CM

## 2014-10-15 DIAGNOSIS — C7800 Secondary malignant neoplasm of unspecified lung: Secondary | ICD-10-CM

## 2014-10-15 DIAGNOSIS — R509 Fever, unspecified: Secondary | ICD-10-CM | POA: Diagnosis present

## 2014-10-15 DIAGNOSIS — C50911 Malignant neoplasm of unspecified site of right female breast: Secondary | ICD-10-CM

## 2014-10-15 DIAGNOSIS — L97519 Non-pressure chronic ulcer of other part of right foot with unspecified severity: Secondary | ICD-10-CM | POA: Diagnosis not present

## 2014-10-15 DIAGNOSIS — L97329 Non-pressure chronic ulcer of left ankle with unspecified severity: Secondary | ICD-10-CM | POA: Diagnosis not present

## 2014-10-15 DIAGNOSIS — R05 Cough: Secondary | ICD-10-CM | POA: Insufficient documentation

## 2014-10-15 LAB — COMPREHENSIVE METABOLIC PANEL (CC13)
ALT: 20 U/L (ref 0–55)
ANION GAP: 13 meq/L — AB (ref 3–11)
AST: 21 U/L (ref 5–34)
Albumin: 3.2 g/dL — ABNORMAL LOW (ref 3.5–5.0)
Alkaline Phosphatase: 89 U/L (ref 40–150)
BUN: 37.7 mg/dL — ABNORMAL HIGH (ref 7.0–26.0)
CALCIUM: 9.3 mg/dL (ref 8.4–10.4)
CHLORIDE: 103 meq/L (ref 98–109)
CO2: 25 mEq/L (ref 22–29)
CREATININE: 1.7 mg/dL — AB (ref 0.6–1.1)
EGFR: 31 mL/min/{1.73_m2} — ABNORMAL LOW (ref >90)
Glucose: 185 mg/dl — ABNORMAL HIGH (ref 70–140)
Potassium: 3.7 mEq/L (ref 3.5–5.1)
Sodium: 141 mEq/L (ref 136–145)
TOTAL PROTEIN: 6.8 g/dL (ref 6.4–8.3)
Total Bilirubin: 0.32 mg/dL (ref 0.20–1.20)

## 2014-10-15 LAB — URINALYSIS, MICROSCOPIC - CHCC
Bilirubin (Urine): NEGATIVE
GLUCOSE UR CHCC: NEGATIVE mg/dL
KETONES: NEGATIVE mg/dL
Nitrite: NEGATIVE
Protein: NEGATIVE mg/dL
Specific Gravity, Urine: 1.01 (ref 1.003–1.035)
Urobilinogen, UR: 0.2 mg/dL (ref 0.2–1)
pH: 6 (ref 4.6–8.0)

## 2014-10-15 LAB — CBC & DIFF AND RETIC
BASO%: 0.8 % (ref 0.0–2.0)
Basophils Absolute: 0 10*3/uL (ref 0.0–0.1)
EOS ABS: 0.1 10*3/uL (ref 0.0–0.5)
EOS%: 1.9 % (ref 0.0–7.0)
HCT: 27.1 % — ABNORMAL LOW (ref 34.8–46.6)
HGB: 8.5 g/dL — ABNORMAL LOW (ref 11.6–15.9)
Immature Retic Fract: 21.2 % — ABNORMAL HIGH (ref 1.60–10.00)
LYMPH%: 9.2 % — ABNORMAL LOW (ref 14.0–49.7)
MCH: 27.9 pg (ref 25.1–34.0)
MCHC: 31.4 g/dL — ABNORMAL LOW (ref 31.5–36.0)
MCV: 88.9 fL (ref 79.5–101.0)
MONO#: 0.3 10*3/uL (ref 0.1–0.9)
MONO%: 12.6 % (ref 0.0–14.0)
NEUT%: 75.5 % (ref 38.4–76.8)
NEUTROS ABS: 2 10*3/uL (ref 1.5–6.5)
Platelets: 205 10*3/uL (ref 145–400)
RBC: 3.05 10*6/uL — AB (ref 3.70–5.45)
RDW: 20.8 % — AB (ref 11.2–14.5)
Retic %: 2.5 % — ABNORMAL HIGH (ref 0.70–2.10)
Retic Ct Abs: 76.25 10*3/uL (ref 33.70–90.70)
WBC: 2.6 10*3/uL — AB (ref 3.9–10.3)
lymph#: 0.2 10*3/uL — ABNORMAL LOW (ref 0.9–3.3)
nRBC: 1 % — ABNORMAL HIGH (ref 0–0)

## 2014-10-15 LAB — CANCER ANTIGEN 27.29: CA 27.29: 41 U/mL — AB (ref 0–39)

## 2014-10-15 MED ORDER — HYDROCODONE-ACETAMINOPHEN 5-300 MG PO TABS: 1.0000 | ORAL_TABLET | Freq: Four times a day (QID) | ORAL | Status: DC | PRN

## 2014-10-15 MED ORDER — HYDROCODONE-ACETAMINOPHEN 5-300 MG PO TABS
1.0000 | ORAL_TABLET | Freq: Four times a day (QID) | ORAL | Status: DC | PRN
Start: 2014-10-15 — End: 2014-10-15

## 2014-10-15 NOTE — Progress Notes (Signed)
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 She started letrozole in 11/2012.  Added Palbociclib 125 mg daily for 21 days on and 7 days off on 01/01/2014 with disease progression. Dose modified to 2 weeks on 1 week off with cycle 3.   2. zometa added on 01/01/2014 for metastatic bone disease. Zometa held due to renal insufficiency and changed to [facility] because of skeletal disease progression and safe renal profile. 3. Started palliative XRT to the left and right femur (30 Gy in 10 fractions) on 02/02/2014 per Dr. Gery Pray which was finished on 02/24/2014.  INTERVAL HISTORY:  Anita [name] 69 y.o. female with a history of right breast cancer with metastases to the bone/lungs here for follow-up.   Today, she came by herself.  She is doing well overall. Her right hip pain is still moderate, slightly worse than before, 8/10, she is not taking any pain meds. She is able to ambulatory independently at home, she is follow up with wound clinic for leg wounds. She has good appetite, eats well. She has chronic dry cough for several month, T100.1 today, no diarrhea, congestion or other new symptoms. No other complains.   MEDICAL HISTORY: Past Medical History  Diagnosis Date  . Fibroid   . Morbid obesity   . 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 COPD (chronic obstructive pulmonary disease)   . Chronic diastolic CHF (congestive heart failure)   . NSTEMI (non-ST elevated myocardial infarction)     Cath November 2014 LAD 40% stenosis, first diagonal 80% stenosis, circumflex 20% stenosis, right coronary artery occluded. The EF was 35-40% at that time.  . Anemia     a. Adm 09/2012 with melena,  Hgb 5.8 -> transfused. EGD/colonoscopy unrevealing.  . Breast cancer     a. Mets to bone. ER 100%/ PR 0%/Her2 neu negative.  [name] Ulcers of both lower legs   . Tobacco abuse   . Hyperlipidemia 02/11/2013  . Chronic respiratory failure     a. On O2 qhs. also portable O2.  . Chronic renal insufficiency   . Lesion of vocal cord     a. CT 05/2013 concerning for tumor.  . Shortness of breath   . Depression   . Diabetes mellitus without complication   . Anginal pain   . Dementia     very mild  . Radiation 02/02/14-02/24/14    left and right femur 30 gray    ALLERGIES:  is allergic to omnipaque and benzene.  MEDICATIONS:    Medication List       This list is accurate as of: 10/15/14  9:10 AM.  Always use your most recent med list.               aspirin 81 MG EC tablet  Take 1 tablet (81 mg total) by mouth daily.     atorvastatin 40 MG tablet  Commonly known as:  LIPITOR  Take 40 mg by mouth daily.     clotrimazole 1 % cream  Commonly known as:  LOTRIMIN  Apply 1 application topically daily.     docusate sodium 100 MG capsule  Commonly known as:  COLACE  Take 100 mg  by mouth daily.     IBRANCE 125 MG capsule  Generic drug:  palbociclib  Take 125 mg by mouth See admin instructions. Take 125mg  every morning for 2 weeks, hold for a week, then repeat. Take whole with food.     isosorbide mononitrate 60 MG 24 hr tablet  Commonly known as:  IMDUR  Take 1 tablet (60 mg total) by mouth daily.     letrozole 2.5 MG tablet  Commonly known as:  FEMARA  Take 1 tablet (2.5 mg total) by mouth daily.     mirtazapine 15 MG tablet  Commonly known as:  REMERON  Take 1 tablet (15 mg total) by mouth at bedtime.     nitroGLYCERIN 0.4 MG SL tablet  Commonly known as:  NITROSTAT  Place 1 tablet (0.4 mg total) under the tongue every 5 (five) minutes as needed for chest pain.     NON FORMULARY  Place 3 L into the nose as needed (oxygen).     potassium chloride SA 20 MEQ tablet  Commonly  known as:  K-DUR,KLOR-CON  Take 1 tablet (20 mEq total) by mouth daily.     torsemide 20 MG tablet  Commonly known as:  DEMADEX  Take 2-4 tablets (40-80 mg total) by mouth 2 (two) times daily. 80mg  in the morning and 40mg  in the evening     VITAMIN D (CHOLECALCIFEROL) PO  Take 1,000 mg by mouth daily.        SURGICAL HISTORY:  Past Surgical History  Procedure Laterality Date  . Colonoscopy, esophagogastroduodenoscopy (egd) and esophageal dilation N/A 10/13/2012    Procedure: colonoscopy and egd;  Surgeon: Wonda Horner, MD;  Location: [facility];  Service: Endoscopy;  Laterality: N/A;  . Left heart catheterization with coronary angiogram N/A 07/07/2013    Procedure: LEFT HEART CATHETERIZATION WITH CORONARY ANGIOGRAM;  Surgeon: Peter M Martinique, MD;  Location: [facility] CATH LAB;  Service: Cardiovascular;  Laterality: N/A;   REVIEW OF SYSTEMS:   Constitutional: Denies fevers, chills or abnormal weight loss Eyes: Denies blurriness of vision Ears, nose, mouth, throat, and face: Denies mucositis or sore throat Respiratory: Denies cough, dyspnea or wheezes Cardiovascular: Denies palpitation, chest discomfort or lower extremity swelling Gastrointestinal:  Denies nausea, heartburn or change in bowel habits Skin: Denies abnormal skin rashes Lymphatics: Denies new lymphadenopathy or easy bruising Neurological:Denies numbness, tingling or new weaknesses Behavioral/Psych: Mood is stable, no new changes  All other systems were reviewed with the patient and are negative.  PHYSICAL EXAMINATION: ECOG PERFORMANCE STATUS: 2  Blood pressure 127/53, pulse 110, temperature 100.1 F (37.8 C), resp. rate 19, height 5\' 2"  (1.575 m), weight 246 lb 1.6 oz (111.63 kg), SpO2 97 %.  GENERAL:alert, no distress and comfortable morbidly obese woman in wheelchair, chronically ill appearing SKIN: skin color, texture, turgor are normal; posterior legs with irritation and erythema with a small abrasion on the  posterior left thigh with mild TTP.  EYES: normal, Conjunctiva are pink and non-injected, sclera clear OROPHARYNX:no exudate, no erythema and lips, buccal mucosa, and tongue normal  NECK: supple, thyroid normal size, non-tender, without nodularity LYMPH:  no palpable lymphadenopathy in the cervical, axillary LUNGS: coarse breathing with slightly increased breathing rate HEART: regular rate & rhythm and no murmurs and 2+ lower extremity edema BREAST: deferred ABDOMEN:abdomen soft, non-tender and normal bowel sounds; obese Musculoskeletal:no cyanosis of digits and no clubbing; bilateral lower extremities are wrapped with gauze and a bandage up to below knee. NEURO: alert & oriented x 3 with  fluent speech, no focal motor/sensory deficits  LABORATORY DATA: CBC Latest Ref Rng 10/15/2014 10/01/2014 08/18/2014  WBC 3.9 - 10.3 10e3/uL 2.6(L) 3.6(L) 4.3  Hemoglobin 11.6 - 15.9 g/dL 8.5(L) 9.1(L) 8.7(L)  Hematocrit 34.8 - 46.6 % 27.1(L) 29.0(L) 27.3(L)  Platelets 145 - 400 10e3/uL 205 287 272    CMP Latest Ref Rng 10/01/2014 08/18/2014 08/13/2014  Glucose 70 - 140 mg/dl 186(H) 144(H) 126(H)  BUN 7.0 - 26.0 mg/dL 29.7(H) 28.8(H) 33(H)  Creatinine 0.6 - 1.1 mg/dL 1.9(H) 1.6(H) 1.76(H)  Sodium 136 - 145 mEq/L 142 140 140  Potassium 3.5 - 5.1 mEq/L 4.2 3.6 3.9  Chloride 96 - 112 mEq/L - - 101  CO2 22 - 29 mEq/L $Remove'29 27 27  'irJwBGh$ Calcium 8.4 - 10.4 mg/dL 9.5 9.1 8.5  Total Protein 6.4 - 8.3 g/dL 6.7 6.7 -  Total Bilirubin 0.20 - 1.20 mg/dL 0.32 0.31 -  Alkaline Phos 40 - 150 U/L 97 86 -  AST 5 - 34 U/L 26 20 -  ALT 0 - 55 U/L 23 20 -   CA 27.29  Status: Finalresult Visible to patient:  Not Released Nextappt: 10/15/2014 at 08:15 AM in Oncology ([facility]) Dx:  Breast cancer metastasized to bone, r...           Ref Range 9:49 AM  61mo ago  27mo ago     CA 27.29 0 - 39 U/mL 38 40 (H) 29         RADIOGRAPHIC STUDIES:  CT chest abdomen and pelvis  09/01/2014 IMPRESSION: Chest Impression:  1. Stable exam of thorax. 2. Several small smudgy pulmonary nodules are not changed. 3. Stable widespread bony metastasis pneumothorax.  Abdomen / Pelvis Impression:  1. Stable small periaortic retroperitoneal lymph node. 2. No evidence disease progression. 3. Stable widespread skeletal metastasis. 4. Left sided staghorn calculus again demonstrated. 5. Nodular focus in the right kidney at site of prior renal cyst is not changed  ASSESSMENT: Metastatic breast cancer on palliative letrozole plus Palbociclib and Xgeva for bone mets.   PLAN:  1.  Metastatic breast cancer (ER pos/PR pos/ HER2 neg) with metastasis to bones and lungs.  --She is clinically stable, metastatic bone pain much improved after palliating of radiation. -Her recent CT chest, abdomen and pelvis showed stable disease overall. -Continue letrozole and Palbociclib 125 mg daily, 2 week on, 1 week off. She will start palbo is next Monday. We'll follow up his CBC closely.  2.  Low grade fever -she feels well overall. I'll check her urine, urine culture and a chest x-ray to rule out infection -I asked her to check in temperature daily in the next few days and call me if she has persistent fever, and I will likely send her a prescription of antibiotics -We'll delay her palpable if she has fever at home.   3. bone metastasis - continue Xgeva monthly  -Her bone pain seems worse lately, I'll give her a prescription of Vicodin.  4. 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   --She has been seen by radiation oncology. She has declined intervention.  She is being followed by Dr. Sondra Come.   5. Leukopenia/Anemia of chronic disease plus/minus iron defiency. -- Past work up for reversible causes of anemia were negative.  --Her WBC are low but stable.  This can in part be explained by her palbociclib.  We will monitor her labs.  6. CHF --She is not fluid overloaded today on  exam. She is on Lasix, nitrogylerin prn,  losartan. Increase lasix prn and weight daily. Cards follow up.   7. Diabetes mellitus, type II. Diet controlled.   8. COPD: On albuterol prn. She remains on home oxygen.  9. Chronic renal insufficiency.  -Stable.  10: Bilateral leg wound -Follow up with wound clinic  All questions were answered. The patient knows to call the clinic with any problems, questions or concerns. We can certainly see the patient much sooner if necessary.  Plan: -RTC with lab in 3 weeks before next cycle of palbociclib  -Check temperature at home and call me if persistent fever -UA, urine culture, chest x-ray today -Start using Vicodin as needed for right hip pain.   I spent 25 minutes counseling the patient face to face. The total time spent in the appointment was 30 minutes.   Truitt Merle  10/15/2014

## 2014-10-15 NOTE — Telephone Encounter (Signed)
Gave avs & calendar for March. °

## 2014-10-19 ENCOUNTER — Telehealth: Payer: Self-pay | Admitting: Cardiology

## 2014-10-19 LAB — URINE CULTURE

## 2014-10-19 NOTE — Telephone Encounter (Signed)
°  1. Which medications need to be refilled? Torsemide  2. Which pharmacy is medication to be sent to?[phone]  3. Do they need a 30 day or 90 day supply? 240  4. Would they like a call back once the medication has been sent to the pharmacy? no

## 2014-10-20 NOTE — Telephone Encounter (Signed)
This was sent in 10/04/14.

## 2014-10-26 ENCOUNTER — Telehealth: Payer: Self-pay | Admitting: Oncology

## 2014-10-26 NOTE — Telephone Encounter (Signed)
Anita [name] called and wanted to make a follow up appointment for March 7.  Appointment has been made by Anita [name], Art therapist.  Anita [name] then called back and asked if she could schedule a CT SIM on March 7.  She said her "side is killing her."  She said her right side/hip area started hurting on Saturday or Sunday and the pain is radiating down her leg.  Advised her that Dr. Sondra Come would be notified and we would call her back if a CT SIM is scheduled.  Anita [name] verbalized agreement.

## 2014-10-29 ENCOUNTER — Ambulatory Visit (INDEPENDENT_AMBULATORY_CARE_PROVIDER_SITE_OTHER): Payer: Commercial Managed Care - HMO | Admitting: Cardiology

## 2014-10-29 ENCOUNTER — Encounter (HOSPITAL_BASED_OUTPATIENT_CLINIC_OR_DEPARTMENT_OTHER): Payer: Commercial Managed Care - HMO | Attending: Internal Medicine

## 2014-10-29 ENCOUNTER — Telehealth: Payer: Self-pay | Admitting: Hematology

## 2014-10-29 ENCOUNTER — Ambulatory Visit: Payer: Commercial Managed Care - HMO | Admitting: Cardiology

## 2014-10-29 ENCOUNTER — Telehealth: Payer: Self-pay | Admitting: Family Medicine

## 2014-10-29 ENCOUNTER — Encounter: Payer: Self-pay | Admitting: Cardiology

## 2014-10-29 VITALS — BP 130/60 | HR 93 | Ht 62.0 in | Wt 238.0 lb

## 2014-10-29 DIAGNOSIS — I2583 Coronary atherosclerosis due to lipid rich plaque: Secondary | ICD-10-CM

## 2014-10-29 DIAGNOSIS — I2510 Atherosclerotic heart disease of native coronary artery without angina pectoris: Secondary | ICD-10-CM

## 2014-10-29 DIAGNOSIS — L97229 Non-pressure chronic ulcer of left calf with unspecified severity: Secondary | ICD-10-CM | POA: Diagnosis not present

## 2014-10-29 DIAGNOSIS — I83212 Varicose veins of right lower extremity with both ulcer of calf and inflammation: Secondary | ICD-10-CM | POA: Diagnosis present

## 2014-10-29 DIAGNOSIS — R0600 Dyspnea, unspecified: Secondary | ICD-10-CM | POA: Diagnosis not present

## 2014-10-29 DIAGNOSIS — I83222 Varicose veins of left lower extremity with both ulcer of calf and inflammation: Secondary | ICD-10-CM | POA: Insufficient documentation

## 2014-10-29 DIAGNOSIS — L97219 Non-pressure chronic ulcer of right calf with unspecified severity: Secondary | ICD-10-CM | POA: Diagnosis not present

## 2014-10-29 NOTE — Telephone Encounter (Signed)
I already filled it out and placed in fax section.

## 2014-10-29 NOTE — Telephone Encounter (Signed)
s.w. pt and confirmed appt....pt ok and aware °

## 2014-10-29 NOTE — Telephone Encounter (Signed)
pts daughter is checking status of fmla paperwork that was sent to [place] a few days ago. Needs to be sure we received them.

## 2014-10-29 NOTE — Progress Notes (Signed)
HPI  The patient presents for follow up after a hospitalization for treatment of dyspnea and anasarca.  She actually thinks she is doing well. She does have chronic oxygen. She has a home Psychologist, counselling.. She thinks her leg swelling is reduced. She's not having any overt shortness of breath, PND or orthopnea. She sleeps in a recliner. She's not having any chest pressure, neck or arm discomfort. She was able to walk in here.     Allergies  Allergen Reactions  . Omnipaque [Iohexol]     Pt claims she developed hives after given contrast  . Benzene Rash    Current Outpatient Prescriptions  Medication Sig Dispense Refill  . aspirin EC 81 MG EC tablet Take 1 tablet (81 mg total) by mouth daily. 30 tablet 3  . atorvastatin (LIPITOR) 40 MG tablet Take 40 mg by mouth daily.    . clotrimazole (LOTRIMIN) 1 % cream Apply 1 application topically daily. 30 g 1  . docusate sodium (COLACE) 100 MG capsule Take 100 mg by mouth daily.    . Hydrocodone-Acetaminophen (VICODIN) 5-300 MG TABS Take 1 tablet by mouth every 6 (six) hours as needed. 30 each 0  . isosorbide mononitrate (IMDUR) 60 MG 24 hr tablet Take 1 tablet (60 mg total) by mouth daily. 30 tablet 5  . letrozole (FEMARA) 2.5 MG tablet Take 1 tablet (2.5 mg total) by mouth daily. 90 tablet 0  . mirtazapine (REMERON) 15 MG tablet Take 1 tablet (15 mg total) by mouth at bedtime. 30 tablet 5  . nitroGLYCERIN (NITROSTAT) 0.4 MG SL tablet Place 1 tablet (0.4 mg total) under the tongue every 5 (five) minutes as needed for chest pain. 30 tablet 0  . NON FORMULARY Place 3 L into the nose as needed (oxygen).     . palbociclib (IBRANCE) 125 MG capsule Take 125 mg by mouth See admin instructions. Take 165m every morning for 2 weeks, hold for a week, then repeat. Take whole with food.    . potassium chloride SA (K-DUR,KLOR-CON) 20 MEQ tablet Take 1 tablet (20 mEq total) by mouth daily. (Patient taking differently: Take 40 mEq by mouth 2 (two) times daily.  ) 60 tablet 5  . torsemide (DEMADEX) 20 MG tablet Take 2-4 tablets (40-80 mg total) by mouth 2 (two) times daily. 88min the morning and 4064mn the evening 120 tablet 3  . VITAMIN D, CHOLECALCIFEROL, PO Take 1,000 mg by mouth daily.      No current facility-administered medications for this visit.    Past Medical History  Diagnosis Date  . Fibroid   . Morbid obesity   . 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 COPD (chronic obstructive pulmonary disease)   . Chronic diastolic CHF (congestive heart failure)   . NSTEMI (non-ST elevated myocardial infarction)     Cath November 2014 LAD 40% stenosis, first diagonal 80% stenosis, circumflex 20% stenosis, right coronary artery occluded. The EF was 35-40% at that time.  . Anemia     a. Adm 09/2012 with melena, Hgb 5.8 -> transfused. EGD/colonoscopy unrevealing.  . Breast cancer     a. Mets to bone. ER 100%/ PR 0%/Her2 neu negative.  . UMarland Kitchencers of both lower legs   . Tobacco abuse   . Hyperlipidemia 02/11/2013  . Chronic respiratory failure     a. On O2 qhs. also portable O2.  . Chronic renal insufficiency   . Lesion of vocal  cord     a. CT 05/2013 concerning for tumor.  . Shortness of breath   . Depression   . Diabetes mellitus without complication   . Anginal pain   . Dementia     very mild  . Radiation 02/02/14-02/24/14    left and right femur 30 gray    Past Surgical History  Procedure Laterality Date  . Colonoscopy, esophagogastroduodenoscopy (egd) and esophageal dilation N/A 10/13/2012    Procedure: colonoscopy and egd;  Surgeon: Wonda Horner, MD;  Location: [facility];  Service: Endoscopy;  Laterality: N/A;  . Left heart catheterization with coronary angiogram N/A 07/07/2013    Procedure: LEFT HEART CATHETERIZATION WITH CORONARY ANGIOGRAM;  Surgeon: Peter M Martinique, MD;  Location: [facility] CATH LAB;  Service: Cardiovascular;  Laterality: N/A;    ROS:  As stated in the HPI and negative for all  other systems.  PHYSICAL EXAM BP 130/60 mmHg  Pulse 93  Ht _0  (1.575 m)  Wt 238 lb (107.956 kg)  BMI 43.52 kg/m2 GEN:  No distress NECK:  No jugular venous distention at 90 degrees, waveform within normal limits, carotid upstroke brisk and symmetric, no bruits, no thyromegaly LUNGS:  Few basilar crackles and BACK:  No CVA tenderness CHEST:  Unremarkable HEART:  S1 and S2 within normal limits, no S3, no S4, no clicks, no rubs, distant heart sounds murmurs ABD:  Positive bowel sounds normal in frequency in pitch, no bruits, no rebound, no guarding, unable to assess midline mass or bruit with the patient seated. EXT:  2 plus pulses throughout, trace bilateral lower extremity edema, no cyanosis no clubbing   ASSESSMENT AND PLAN   CHRNOIC DIASTOLIC HF:  She seems to be near euvolemic today. Her labs were checked recently.  She will continue as above.    CAD:  The patient has no new sypmtoms.  No further cardiovascular testing is indicated.  We will continue with aggressive risk reduction and meds as listed.

## 2014-10-29 NOTE — Patient Instructions (Signed)
Your physician recommends that you schedule a follow-up appointment in: as needed with Dr. Hochrein  

## 2014-11-01 ENCOUNTER — Encounter: Payer: Self-pay | Admitting: Radiation Oncology

## 2014-11-01 ENCOUNTER — Ambulatory Visit
Admission: RE | Admit: 2014-11-01 | Discharge: 2014-11-01 | Disposition: A | Discharge status: Home / Self Care | Payer: Commercial Managed Care - HMO | Source: Ambulatory Visit | Attending: Radiation Oncology | Admitting: Radiation Oncology

## 2014-11-01 VITALS — BP 106/66 | HR 86 | Temp 98.0°F | Resp 28 | Ht 62.0 in | Wt 235.8 lb

## 2014-11-01 DIAGNOSIS — C7951 Secondary malignant neoplasm of bone: Principal | ICD-10-CM

## 2014-11-01 DIAGNOSIS — C50919 Malignant neoplasm of unspecified site of unspecified female breast: Secondary | ICD-10-CM

## 2014-11-01 NOTE — Progress Notes (Signed)
Caidance here for follow up after treatment to her left and right femur.  She denies pain today but reports her right hip was hurting a week ago.  She said the pain was sharp and constant and rated it at an 8/10.  She said the pain is gone now.  She continues to take Femara, [facility] and gets xgeva injections once a month.  She denies trouble walking but is in a wheelchair today due to her COPD.  She is on 3 L of oxygen and has trouble breathing when walking distances.  She reports having a good appetite.  She reports fatigue.  BP 106/66 mmHg  Pulse 86  Temp(Src) 98 F (36.7 C) (Oral)  Resp 28  Ht 5\' 2"  (1.575 m)  Wt 235 lb 12.8 oz (106.958 kg)  BMI 43.12 kg/m2  SpO2 98%

## 2014-11-01 NOTE — Progress Notes (Signed)
Radiation Oncology         [phone]) [phone] ________________________________  Name: Anita [name] MRN: [id number]  Date: 11/01/2014  DOB: 1946/06/01  Follow-Up Visit Note  CC: [name], DO  Thornell Sartorius, MD    ICD-9-CM ICD-10-CM   1. Breast cancer metastasized to bone, unspecified laterality 174.9 C50.919    198.5 C79.51     Diagnosis:  Metastatic breast cancer with painful osseous metastasis  Interval Since Last Radiation:  9  months,  treatments were directed at both the left and right femur  Narrative:  The patient returns today for routine follow-up.  A Couple weeks ago the patient was having pain in the right pelvis area but this issue is resolved. She continues on 3 L of oxygen. She follows up with cardiology concerning her chronic diastolic heart failure. The patient is seen in the wound care center once a week for her chronic lymphedema issues.  She continues on [place] and xgeva for management of her metastatic breast cancer promarily with osseous metastasis. Patient did undergo imaging in January  showing no new problems or progressive disease. She has diffuse diffuse osseous metastasis.                             ALLERGIES:  is allergic to omnipaque and benzene.  Meds: Current Outpatient Prescriptions  Medication Sig Dispense Refill  . aspirin EC 81 MG EC tablet Take 1 tablet (81 mg total) by mouth daily. 30 tablet 3  . atorvastatin (LIPITOR) 40 MG tablet Take 40 mg by mouth daily.    . clotrimazole (LOTRIMIN) 1 % cream Apply 1 application topically daily. 30 g 1  . docusate sodium (COLACE) 100 MG capsule Take 100 mg by mouth daily.    . isosorbide mononitrate (IMDUR) 60 MG 24 hr tablet Take 1 tablet (60 mg total) by mouth daily. 30 tablet 5  . letrozole (FEMARA) 2.5 MG tablet Take 1 tablet (2.5 mg total) by mouth daily. 90 tablet 0  . mirtazapine (REMERON) 15 MG tablet Take 1 tablet (15 mg total) by mouth at bedtime. 30 tablet 5  . nitroGLYCERIN (NITROSTAT)  0.4 MG SL tablet Place 1 tablet (0.4 mg total) under the tongue every 5 (five) minutes as needed for chest pain. 30 tablet 0  . NON FORMULARY Place 3 L into the nose as needed (oxygen).     . palbociclib (IBRANCE) 125 MG capsule Take 125 mg by mouth See admin instructions. Take 125mg  every morning for 2 weeks, hold for a week, then repeat. Take whole with food.    . potassium chloride SA (K-DUR,KLOR-CON) 20 MEQ tablet Take 1 tablet (20 mEq total) by mouth daily. (Patient taking differently: Take 40 mEq by mouth 2 (two) times daily. ) 60 tablet 5  . torsemide (DEMADEX) 20 MG tablet Take 2-4 tablets (40-80 mg total) by mouth 2 (two) times daily. 80mg  in the morning and 40mg  in the evening 120 tablet 3  . VITAMIN D, CHOLECALCIFEROL, PO Take 1,000 mg by mouth daily.     . Hydrocodone-Acetaminophen (VICODIN) 5-300 MG TABS Take 1 tablet by mouth every 6 (six) hours as needed. (Patient not taking: Reported on 11/01/2014) 30 each 0   No current facility-administered medications for this encounter.    Physical Findings: The patient is in no acute distress. Patient is alert and oriented.  height is 5\' 2"  (1.575 m) and weight is 235 lb 12.8 oz (106.958  kg). Her oral temperature is 98 F (36.7 C). Her blood pressure is 106/66 and her pulse is 86. Her respiration is 28 and oxygen saturation is 98%. . The lungs are clear except for mild bile basilar crackles. The heart has a regular rhythm and rate. Palpation along the abdominal area reveals no point tenderness. The patient denies any pain with palpation within the pelvic region area  Lab Findings: Lab Results  Component Value Date   WBC 2.6* 10/15/2014   HGB 8.5* 10/15/2014   HCT 27.1* 10/15/2014   MCV 88.9 10/15/2014   PLT 205 10/15/2014    Radiographic Findings: Dg Chest 2 View  10/15/2014   CLINICAL DATA:  Fever and cough.  Metastatic breast carcinoma  EXAM: CHEST  2 VIEW  COMPARISON:  Chest radiograph June 19, 2014; chest CT September 01, 2014   FINDINGS: There is no edema or consolidation. Heart is upper normal in size with pulmonary vascularity within normal limits. No adenopathy apparent. There is evidence of a prior fracture of the left posterior sixth rib. There is an expansile metastasis in the anterior left second rib. There is also metastatic disease in both anterior first ribs, the lateral right clavicle, in the inferolateral right scapula. A more subtle lesion is seen in the inferior left scapula.  IMPRESSION: Multiple bony metastases. No edema or consolidation. No adenopathy appreciable.   Electronically Signed   By: Lowella Grip III M.D.   On: 10/15/2014 10:46    Impression:  Clinically stable with diffuse osseous metastasis. Patient will continue on her breast cancer therapy as above.  Plan:  When necessary follow-up in radiation oncology. The patient will continue close follow-up with medical oncology.  ____________________________________ Blair Promise, MD

## 2014-11-04 ENCOUNTER — Other Ambulatory Visit: Payer: Self-pay | Admitting: Family Medicine

## 2014-11-04 MED ORDER — POTASSIUM CHLORIDE CRYS ER 20 MEQ PO TBCR: 20.0000 meq | EXTENDED_RELEASE_TABLET | Freq: Every day | ORAL | Status: DC

## 2014-11-04 NOTE — Telephone Encounter (Signed)
Need RX for potassium sent to [facility] one to [phone] (mail order)

## 2014-11-05 ENCOUNTER — Encounter: Payer: Self-pay | Admitting: Hematology

## 2014-11-05 ENCOUNTER — Other Ambulatory Visit (HOSPITAL_BASED_OUTPATIENT_CLINIC_OR_DEPARTMENT_OTHER): Payer: Commercial Managed Care - HMO

## 2014-11-05 ENCOUNTER — Ambulatory Visit (HOSPITAL_BASED_OUTPATIENT_CLINIC_OR_DEPARTMENT_OTHER): Payer: Commercial Managed Care - HMO

## 2014-11-05 ENCOUNTER — Ambulatory Visit (HOSPITAL_BASED_OUTPATIENT_CLINIC_OR_DEPARTMENT_OTHER): Payer: Commercial Managed Care - HMO | Admitting: Hematology

## 2014-11-05 ENCOUNTER — Telehealth: Payer: Self-pay | Admitting: Hematology

## 2014-11-05 VITALS — BP 133/52 | HR 84 | Temp 98.0°F | Resp 18 | Ht 62.0 in | Wt 238.5 lb

## 2014-11-05 DIAGNOSIS — D638 Anemia in other chronic diseases classified elsewhere: Secondary | ICD-10-CM

## 2014-11-05 DIAGNOSIS — C7951 Secondary malignant neoplasm of bone: Secondary | ICD-10-CM

## 2014-11-05 DIAGNOSIS — C7800 Secondary malignant neoplasm of unspecified lung: Secondary | ICD-10-CM | POA: Diagnosis not present

## 2014-11-05 DIAGNOSIS — N189 Chronic kidney disease, unspecified: Secondary | ICD-10-CM

## 2014-11-05 DIAGNOSIS — Z170 Estrogen receptor positive status [ER+]: Secondary | ICD-10-CM | POA: Diagnosis not present

## 2014-11-05 DIAGNOSIS — L97229 Non-pressure chronic ulcer of left calf with unspecified severity: Secondary | ICD-10-CM | POA: Diagnosis not present

## 2014-11-05 DIAGNOSIS — D72819 Decreased white blood cell count, unspecified: Secondary | ICD-10-CM

## 2014-11-05 DIAGNOSIS — C50911 Malignant neoplasm of unspecified site of right female breast: Secondary | ICD-10-CM

## 2014-11-05 DIAGNOSIS — J449 Chronic obstructive pulmonary disease, unspecified: Secondary | ICD-10-CM

## 2014-11-05 DIAGNOSIS — C50919 Malignant neoplasm of unspecified site of unspecified female breast: Secondary | ICD-10-CM

## 2014-11-05 DIAGNOSIS — I83212 Varicose veins of right lower extremity with both ulcer of calf and inflammation: Secondary | ICD-10-CM | POA: Diagnosis not present

## 2014-11-05 DIAGNOSIS — L97219 Non-pressure chronic ulcer of right calf with unspecified severity: Secondary | ICD-10-CM | POA: Diagnosis not present

## 2014-11-05 DIAGNOSIS — I509 Heart failure, unspecified: Secondary | ICD-10-CM

## 2014-11-05 DIAGNOSIS — I83222 Varicose veins of left lower extremity with both ulcer of calf and inflammation: Secondary | ICD-10-CM | POA: Diagnosis not present

## 2014-11-05 LAB — CBC & DIFF AND RETIC
BASO%: 0.3 % (ref 0.0–2.0)
Basophils Absolute: 0 10*3/uL (ref 0.0–0.1)
EOS%: 2.3 % (ref 0.0–7.0)
Eosinophils Absolute: 0.1 10*3/uL (ref 0.0–0.5)
HCT: 28.4 % — ABNORMAL LOW (ref 34.8–46.6)
HGB: 9 g/dL — ABNORMAL LOW (ref 11.6–15.9)
Immature Retic Fract: 18.9 % — ABNORMAL HIGH (ref 1.60–10.00)
LYMPH%: 11.8 % — ABNORMAL LOW (ref 14.0–49.7)
MCH: 28.3 pg (ref 25.1–34.0)
MCHC: 31.7 g/dL (ref 31.5–36.0)
MCV: 89.3 fL (ref 79.5–101.0)
MONO#: 0.4 10*3/uL (ref 0.1–0.9)
MONO%: 13.1 % (ref 0.0–14.0)
NEUT%: 72.5 % (ref 38.4–76.8)
NEUTROS ABS: 2.2 10*3/uL (ref 1.5–6.5)
PLATELETS: 162 10*3/uL (ref 145–400)
RBC: 3.18 10*6/uL — ABNORMAL LOW (ref 3.70–5.45)
RDW: 20.9 % — ABNORMAL HIGH (ref 11.2–14.5)
Retic %: 2.49 % — ABNORMAL HIGH (ref 0.70–2.10)
Retic Ct Abs: 79.18 10*3/uL (ref 33.70–90.70)
WBC: 3.1 10*3/uL — AB (ref 3.9–10.3)
lymph#: 0.4 10*3/uL — ABNORMAL LOW (ref 0.9–3.3)

## 2014-11-05 LAB — COMPREHENSIVE METABOLIC PANEL (CC13)
ALBUMIN: 3.2 g/dL — AB (ref 3.5–5.0)
ALT: 24 U/L (ref 0–55)
AST: 20 U/L (ref 5–34)
Alkaline Phosphatase: 94 U/L (ref 40–150)
Anion Gap: 10 mEq/L (ref 3–11)
BILIRUBIN TOTAL: 0.36 mg/dL (ref 0.20–1.20)
BUN: 29.5 mg/dL — ABNORMAL HIGH (ref 7.0–26.0)
CO2: 26 mEq/L (ref 22–29)
Calcium: 9 mg/dL (ref 8.4–10.4)
Chloride: 106 mEq/L (ref 98–109)
Creatinine: 1.6 mg/dL — ABNORMAL HIGH (ref 0.6–1.1)
EGFR: 33 mL/min/{1.73_m2} — ABNORMAL LOW (ref >90)
Glucose: 198 mg/dl — ABNORMAL HIGH (ref 70–140)
Potassium: 3.8 mEq/L (ref 3.5–5.1)
SODIUM: 141 meq/L (ref 136–145)
Total Protein: 6.8 g/dL (ref 6.4–8.3)

## 2014-11-05 LAB — CANCER ANTIGEN 27.29: CA 27.29: 46 U/mL — ABNORMAL HIGH (ref 0–39)

## 2014-11-05 MED ORDER — DENOSUMAB 120 MG/1.7ML ~~LOC~~ SOLN
120.0000 mg | Freq: Once | SUBCUTANEOUS | Status: AC
  Administered 2014-11-05: 120 mg via SUBCUTANEOUS
  Filled 2014-11-05: qty 1.7

## 2014-11-05 NOTE — Progress Notes (Signed)
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 She started letrozole in 11/2012.  Added Palbociclib 125 mg daily for 21 days on and 7 days off on 01/01/2014 with disease progression. Dose modified to 2 weeks on 1 week off with cycle 3.   2. zometa added on 01/01/2014 for metastatic bone disease. Zometa held due to renal insufficiency and changed to [facility] because of skeletal disease progression and safe renal profile. 3. Started palliative XRT to the left and right femur (30 Gy in 10 fractions) on 02/02/2014 per Dr. Gery Pray which was finished on 02/24/2014.  INTERVAL HISTORY:  Anita [name] 69 y.o. female with a history of right breast cancer with metastases to the bone/lungs here for follow-up.   Today, she came with her daugher. She feels well overall. She is on continuous oxygen at night and when she goes out. She is able to take care of her self, cook and goes to school 4 days a week, 9am-5pm, she is studying psychology. She does feel tired at the end of the day, but is able to manage. She has some hair loss. Mild dry cough. No fever or chills. No pain or other new complains.   MEDICAL HISTORY: Past Medical History  Diagnosis Date  . Fibroid   . Morbid obesity   . 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 COPD (chronic obstructive pulmonary disease)   . Chronic diastolic CHF (congestive heart failure)   . NSTEMI (non-ST elevated myocardial infarction)     Cath November 2014 LAD 40% stenosis, first diagonal 80% stenosis, circumflex 20% stenosis, right coronary artery occluded. The EF was 35-40% at that time.  . Anemia     a. Adm 09/2012 with melena, Hgb 5.8 -> transfused.  EGD/colonoscopy unrevealing.  . Breast cancer     a. Mets to bone. ER 100%/ PR 0%/Her2 neu negative.  [name] Ulcers of both lower legs   . Tobacco abuse   . Hyperlipidemia 02/11/2013  . Chronic respiratory failure     a. On O2 qhs. also portable O2.  . Chronic renal insufficiency   . Lesion of vocal cord     a. CT 05/2013 concerning for tumor.  . Shortness of breath   . Depression   . Diabetes mellitus without complication   . Anginal pain   . Dementia     very mild  . Radiation 02/02/14-02/24/14    left and right femur 30 gray    ALLERGIES:  is allergic to omnipaque and benzene.  MEDICATIONS:    Medication List       This list is accurate as of: 11/05/14  9:45 AM.  Always use your most recent med list.               aspirin 81 MG EC tablet  Take 1 tablet (81 mg total) by mouth daily.     atorvastatin 40 MG tablet  Commonly known as:  LIPITOR  Take 40 mg by mouth daily.     clotrimazole 1 % cream  Commonly known as:  LOTRIMIN  Apply 1 application topically daily.     docusate sodium 100 MG capsule  Commonly known as:  COLACE  Take 100 mg by mouth daily.     Hydrocodone-Acetaminophen 5-300 MG Tabs  Commonly known as:  VICODIN  Take 1 tablet by mouth every 6 (six) hours as needed.     IBRANCE 125 MG capsule  Generic drug:  palbociclib  Take 125 mg by mouth See admin instructions. Take 125mg  every morning for 2 weeks, hold for a week, then repeat. Take whole with food.     isosorbide mononitrate 60 MG 24 hr tablet  Commonly known as:  IMDUR  Take 1 tablet (60 mg total) by mouth daily.     letrozole 2.5 MG tablet  Commonly known as:  FEMARA  Take 1 tablet (2.5 mg total) by mouth daily.     mirtazapine 15 MG tablet  Commonly known as:  REMERON  Take 1 tablet (15 mg total) by mouth at bedtime.     nitroGLYCERIN 0.4 MG SL tablet  Commonly known as:  NITROSTAT  Place 1 tablet (0.4 mg total) under the tongue every 5 (five) minutes as needed for chest pain.     NON  FORMULARY  Place 3 L into the nose as needed (oxygen).     potassium chloride SA 20 MEQ tablet  Commonly known as:  K-DUR,KLOR-CON  Take 1 tablet (20 mEq total) by mouth daily.     torsemide 20 MG tablet  Commonly known as:  DEMADEX  Take 2-4 tablets (40-80 mg total) by mouth 2 (two) times daily. 80mg  in the morning and 40mg  in the evening     VITAMIN D (CHOLECALCIFEROL) PO  Take 1,000 mg by mouth daily.        SURGICAL HISTORY:  Past Surgical History  Procedure Laterality Date  . Colonoscopy, esophagogastroduodenoscopy (egd) and esophageal dilation N/A 10/13/2012    Procedure: colonoscopy and egd;  Surgeon: Wonda Horner, MD;  Location: [facility];  Service: Endoscopy;  Laterality: N/A;  . Left heart catheterization with coronary angiogram N/A 07/07/2013    Procedure: LEFT HEART CATHETERIZATION WITH CORONARY ANGIOGRAM;  Surgeon: Peter M Martinique, MD;  Location: [facility];  Service: Cardiovascular;  Laterality: N/A;   REVIEW OF SYSTEMS:   Constitutional: Denies fevers, chills or abnormal weight loss Eyes: Denies blurriness of vision Ears, nose, mouth, throat, and face: Denies mucositis or sore throat Respiratory: Denies cough, dyspnea or wheezes Cardiovascular: Denies palpitation, chest discomfort or lower extremity swelling Gastrointestinal:  Denies nausea, heartburn or change in bowel habits Skin: Denies abnormal skin rashes Lymphatics: Denies new lymphadenopathy or easy bruising Neurological:Denies numbness, tingling or new weaknesses Behavioral/Psych: Mood is stable, no new changes  All other systems were reviewed with the patient and are negative.  PHYSICAL EXAMINATION: ECOG PERFORMANCE STATUS: 2  Blood pressure 133/52, pulse 84, temperature 98 F (36.7 C), temperature source Oral, resp. rate 18, height 5\' 2"  (1.575 m), weight 238 lb 8 oz (108.183 kg), SpO2 100 %.  GENERAL:alert, no distress and comfortable morbidly obese woman in wheelchair, chronically ill  appearing SKIN: skin color, texture, turgor are normal; posterior legs with irritation and erythema with a small abrasion on the posterior left thigh with mild TTP.  EYES: normal, Conjunctiva are pink and non-injected, sclera clear OROPHARYNX:no exudate, no erythema and lips, buccal mucosa, and tongue normal  NECK: supple, thyroid normal size, non-tender, without nodularity LYMPH:  no palpable lymphadenopathy in the cervical, axillary LUNGS: coarse breathing with slightly increased breathing rate HEART: regular rate & rhythm and no murmurs and 2+ lower extremity edema BREAST: deferred ABDOMEN:abdomen soft, non-tender and  normal bowel sounds; obese Musculoskeletal:no cyanosis of digits and no clubbing; bilateral lower extremities are wrapped with gauze and a bandage up to mid calf. NEURO: alert & oriented x 3 with fluent speech, no focal motor/sensory deficits  LABORATORY DATA: CBC Latest Ref Rng 11/05/2014 10/15/2014 10/01/2014  WBC 3.9 - 10.3 10e3/uL 3.1(L) 2.6(L) 3.6(L)  Hemoglobin 11.6 - 15.9 g/dL 9.0(L) 8.5(L) 9.1(L)  Hematocrit 34.8 - 46.6 % 28.4(L) 27.1(L) 29.0(L)  Platelets 145 - 400 10e3/uL 162 205 287    CMP Latest Ref Rng 11/05/2014 10/15/2014 10/01/2014  Glucose 70 - 140 mg/dl 198(H) 185(H) 186(H)  BUN 7.0 - 26.0 mg/dL 29.5(H) 37.7(H) 29.7(H)  Creatinine 0.6 - 1.1 mg/dL 1.6(H) 1.7(H) 1.9(H)  Sodium 136 - 145 mEq/L 141 141 142  Potassium 3.5 - 5.1 mEq/L 3.8 3.7 4.2  Chloride 96 - 112 mEq/L - - -  CO2 22 - 29 mEq/L $Remove'26 25 29  'fcByext$ Calcium 8.4 - 10.4 mg/dL 9.0 9.3 9.5  Total Protein 6.4 - 8.3 g/dL 6.8 6.8 6.7  Total Bilirubin 0.20 - 1.20 mg/dL 0.36 0.32 0.32  Alkaline Phos 40 - 150 U/L 94 89 97  AST 5 - 34 U/L $Remo'20 21 26  'uObSZ$ ALT 0 - 55 U/L $Remo'24 20 23   'tDZnN$ CA 27.29  Status: Finalresult Visible to patient:  Not Released Nextappt: 10/15/2014 at 08:15 AM in Oncology ([facility] [facility]) Dx:  Breast cancer metastasized to bone, r...           Ref Range 9:49 AM  15mo ago  45mo  ago     CA 27.29 0 - 39 U/mL 38 40 (H) 29         RADIOGRAPHIC STUDIES:  CT chest abdomen and pelvis 09/01/2014 IMPRESSION: Chest Impression:  1. Stable exam of thorax. 2. Several small smudgy pulmonary nodules are not changed. 3. Stable widespread bony metastasis pneumothorax.  Abdomen / Pelvis Impression:  1. Stable small periaortic retroperitoneal lymph node. 2. No evidence disease progression. 3. Stable widespread skeletal metastasis. 4. Left sided staghorn calculus again demonstrated. 5. Nodular focus in the right kidney at site of prior renal cyst is not changed  ASSESSMENT: Metastatic breast cancer on palliative letrozole plus Palbociclib and Xgeva for bone mets.   PLAN:  1.  Metastatic breast cancer (ER pos/PR pos/ HER2 neg) with metastasis to bones and lungs.  --She is clinically stable, metastatic bone pain much improved after palliating of radiation. -Her restaging CT chest, abdomen and pelvis on 09/01/2014 showed stable disease overall. -mild cytopenia is stable, ANC 2.2 today. She is tolerating treatment well.  -Continue letrozole and Palbociclib 125 mg daily, 2 week on, 1 week off. She will start palbo is next Monday. We'll follow up his CBC closely.  2. bone metastasis - continue Xgeva monthly  -I will schedule her xgeva for the next 3 month   3. 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   --She has been seen by radiation oncology. She has declined intervention.  She is being followed by Dr. Sondra Come.   5. Leukopenia/Anemia of chronic disease plus/minus iron defiency. -- Past work up for reversible causes of anemia were negative.  --Her WBC are low but stable.  This can in part be explained by her palbociclib.  We will monitor her labs. -repeat iron study next visit   6. CHF --She is not fluid overloaded today on exam. She is on Lasix, nitrogylerin prn, losartan. Increase lasix prn and weight daily. Cards follow up.   7. Diabetes mellitus, type  II. Diet controlled.   8. COPD: On albuterol prn. She remains on home oxygen.  9. Chronic renal insufficiency.  -Stable.  10: Bilateral leg wound -improved lately  -Follow up with wound clinic  All questions were answered. The patient knows to call the clinic with any problems, questions or concerns. We can certainly see the patient much sooner if necessary.  Plan: -RTC with lab in 3 weeks before next cycle of palbociclib  -Check temperature at home and call me if persistent fever -UA, urine culture, chest x-ray today -Start using Vicodin as needed for right hip pain.   I spent 25 minutes counseling the patient face to face. The total time spent in the appointment was 30 minutes.   Truitt Merle  11/05/2014

## 2014-11-05 NOTE — Telephone Encounter (Signed)
Gave avs & calendar for April,May,June. Per patient needed to be the April 15th due to other appointments.

## 2014-11-07 ENCOUNTER — Encounter: Payer: Self-pay | Admitting: Hematology

## 2014-11-08 ENCOUNTER — Telehealth: Payer: Self-pay | Admitting: Family Medicine

## 2014-11-08 ENCOUNTER — Other Ambulatory Visit: Payer: Self-pay | Admitting: Family Medicine

## 2014-11-08 DIAGNOSIS — I509 Heart failure, unspecified: Secondary | ICD-10-CM

## 2014-11-08 NOTE — Telephone Encounter (Signed)
Needs referral for pt from [facility] / thanks sr

## 2014-11-10 NOTE — Telephone Encounter (Signed)
Konrad Felix called back about the referral

## 2014-11-11 NOTE — Telephone Encounter (Signed)
I placed a referral to [facility]. Is there something else that is needed?

## 2014-11-12 ENCOUNTER — Other Ambulatory Visit: Payer: Self-pay | Admitting: Cardiology

## 2014-11-12 DIAGNOSIS — I83212 Varicose veins of right lower extremity with both ulcer of calf and inflammation: Secondary | ICD-10-CM | POA: Diagnosis not present

## 2014-11-12 DIAGNOSIS — L97219 Non-pressure chronic ulcer of right calf with unspecified severity: Secondary | ICD-10-CM | POA: Diagnosis not present

## 2014-11-12 DIAGNOSIS — L97229 Non-pressure chronic ulcer of left calf with unspecified severity: Secondary | ICD-10-CM | POA: Diagnosis not present

## 2014-11-12 DIAGNOSIS — I83222 Varicose veins of left lower extremity with both ulcer of calf and inflammation: Secondary | ICD-10-CM | POA: Diagnosis not present

## 2014-11-12 NOTE — Telephone Encounter (Signed)
°  1. Which medications need to be refilled? Potassium 20 mg,4 each day-please call this in today 2. Which pharmacy is medication to be sent to?[facility]-[phone]  3. Do they need a 30 day or 90 day supply? 360 and refills 4. Would they like a call back once the medication has been sent to the pharmacy? yes

## 2014-11-12 NOTE — Telephone Encounter (Signed)
Please advise on how patient should be taking the potassium. It was sent in 11/04/14 by pcp for one qd, but per phone call, patient is requesting 4qd. Thanks, MI

## 2014-11-15 ENCOUNTER — Telehealth: Payer: Self-pay | Admitting: Family Medicine

## 2014-11-15 ENCOUNTER — Other Ambulatory Visit: Payer: Self-pay | Admitting: Family Medicine

## 2014-11-15 MED ORDER — POTASSIUM CHLORIDE CRYS ER 20 MEQ PO TBCR: 40.0000 meq | EXTENDED_RELEASE_TABLET | Freq: Two times a day (BID) | ORAL | Status: DC

## 2014-11-15 NOTE — Telephone Encounter (Signed)
I spoke with patient.  She has been taking potassium 21meq 2 tablets twice a day for greater than 6 months.  I looked back through Dr Hochrein's last office note and it is noted that she was taking that dose.  I also looked back through the med history and it was prescribed that way back in February 2015 and continued that way until recently.  Her last potassium level was 3.8.  I will refill the potassium 38meq 2 tablets twice a day.

## 2014-11-15 NOTE — Telephone Encounter (Signed)
Pt called because her potassium medication was called in incorrectly. She take 4 tablets a day, 2 in the morning and 2 in the evening. The pharmacy said that the doctor called this in for 1 time a day. jw

## 2014-11-15 NOTE — Telephone Encounter (Signed)
New Rx sent.

## 2014-11-16 NOTE — Telephone Encounter (Signed)
Agree 

## 2014-11-16 NOTE — Telephone Encounter (Signed)
Attempted to call patient, no answer 

## 2014-11-17 ENCOUNTER — Other Ambulatory Visit: Payer: Self-pay

## 2014-11-17 NOTE — Patient Outreach (Signed)
   11/17/2014   Ralene Cork [birth year] [id number]  RN CM attempted to reach patient to discuss [facility] services.  This was an MD referral from [facility], DO.  Patient was unavailable at the time.  HIPPA compliant voice mail message left with return call back phone number.   RN CM will try back at a later date.  Maury Dus, RN, Ishmael Holter, [place] Telephonic Care Coordinator [phone]

## 2014-11-17 NOTE — Telephone Encounter (Signed)
Attempted to call pt again to inform her that her new Rx had been sent, and phone just rang. Katharina Caper, April D

## 2014-11-19 ENCOUNTER — Other Ambulatory Visit: Payer: Self-pay | Admitting: Internal Medicine

## 2014-11-19 DIAGNOSIS — L97219 Non-pressure chronic ulcer of right calf with unspecified severity: Secondary | ICD-10-CM | POA: Diagnosis not present

## 2014-11-19 DIAGNOSIS — I83222 Varicose veins of left lower extremity with both ulcer of calf and inflammation: Secondary | ICD-10-CM | POA: Diagnosis not present

## 2014-11-19 DIAGNOSIS — L97229 Non-pressure chronic ulcer of left calf with unspecified severity: Secondary | ICD-10-CM | POA: Diagnosis not present

## 2014-11-19 DIAGNOSIS — I83212 Varicose veins of right lower extremity with both ulcer of calf and inflammation: Secondary | ICD-10-CM | POA: Diagnosis not present

## 2014-11-23 ENCOUNTER — Other Ambulatory Visit: Payer: Self-pay

## 2014-11-23 NOTE — Patient Outreach (Signed)
[facility]) Care Management  11/23/2014  IVIONNA VERLEY 01/19/1946 [id number]  RN CM has attempted 2nd outreach calls to patient to discuss [facility] services.  Patient was unavailable each time attempt made.  RN CM will try to reach patient again at a later date.  Maury Dus, RN, Ishmael Holter, [place] Telephonic Care Coordinator [phone]

## 2014-11-25 ENCOUNTER — Other Ambulatory Visit: Payer: Self-pay

## 2014-11-25 NOTE — Patient Outreach (Signed)
[facility]) Care Management  11/25/2014  Anita [name] 1946/06/14 [id number]   RN CM has made 3 attempts to reach this patient.  Unable to reach patient each time.  RN CM will send a letter to patient with information about [facility] services.  If no response from patient within next 10 days this case will be closed and primary physician notified.   Maury Dus, RN, Ishmael Holter, [place] Telephonic Care Coordinator [phone]

## 2014-11-26 ENCOUNTER — Telehealth: Payer: Self-pay | Admitting: Family Medicine

## 2014-11-26 MED ORDER — CLOTRIMAZOLE 1 % EX CREA: 1.0000 "application " | TOPICAL_CREAM | Freq: Every day | CUTANEOUS | Status: DC

## 2014-11-26 MED ORDER — ISOSORBIDE MONONITRATE ER 60 MG PO TB24: 60.0000 mg | ORAL_TABLET | Freq: Every day | ORAL | Status: DC

## 2014-11-26 NOTE — Telephone Encounter (Signed)
Need refill on her clotrimazole cream to use right now sent to [facility] and a rx for same med be sent to her mail order.   Also need her isosorbide sent to the mail order as well.

## 2014-11-26 NOTE — Telephone Encounter (Signed)
Rxs sent

## 2014-11-29 ENCOUNTER — Other Ambulatory Visit: Payer: Self-pay | Admitting: Pulmonary Disease

## 2014-11-30 ENCOUNTER — Telehealth: Payer: Self-pay | Admitting: Family Medicine

## 2014-11-30 ENCOUNTER — Other Ambulatory Visit: Payer: Self-pay | Admitting: Family Medicine

## 2014-11-30 DIAGNOSIS — S42301D Unspecified fracture of shaft of humerus, right arm, subsequent encounter for fracture with routine healing: Secondary | ICD-10-CM

## 2014-11-30 NOTE — Telephone Encounter (Signed)
[facility] authorization [phone]

## 2014-11-30 NOTE — Telephone Encounter (Signed)
[facility] called and would like a silverback put in for the patients 6 month f/u. Please fax this to [phone]. jw

## 2014-12-02 NOTE — Telephone Encounter (Signed)
Pt requesting refill on Anoro. Last refill 08/02/13 with 5 refills. Please advise if we can refill.

## 2014-12-03 ENCOUNTER — Other Ambulatory Visit: Payer: Self-pay | Admitting: Hematology

## 2014-12-03 ENCOUNTER — Encounter (HOSPITAL_BASED_OUTPATIENT_CLINIC_OR_DEPARTMENT_OTHER): Payer: Commercial Managed Care - HMO | Attending: Internal Medicine

## 2014-12-03 ENCOUNTER — Ambulatory Visit (INDEPENDENT_AMBULATORY_CARE_PROVIDER_SITE_OTHER): Payer: Commercial Managed Care - HMO | Admitting: Family Medicine

## 2014-12-03 ENCOUNTER — Other Ambulatory Visit: Payer: Self-pay | Admitting: Internal Medicine

## 2014-12-03 ENCOUNTER — Encounter: Payer: Self-pay | Admitting: Family Medicine

## 2014-12-03 ENCOUNTER — Other Ambulatory Visit: Payer: Self-pay | Admitting: Family Medicine

## 2014-12-03 VITALS — BP 153/50 | HR 102 | Temp 98.1°F | Ht 62.0 in | Wt 234.9 lb

## 2014-12-03 DIAGNOSIS — E119 Type 2 diabetes mellitus without complications: Secondary | ICD-10-CM

## 2014-12-03 DIAGNOSIS — D638 Anemia in other chronic diseases classified elsewhere: Secondary | ICD-10-CM

## 2014-12-03 DIAGNOSIS — L97811 Non-pressure chronic ulcer of other part of right lower leg limited to breakdown of skin: Secondary | ICD-10-CM | POA: Insufficient documentation

## 2014-12-03 DIAGNOSIS — L97821 Non-pressure chronic ulcer of other part of left lower leg limited to breakdown of skin: Secondary | ICD-10-CM | POA: Insufficient documentation

## 2014-12-03 DIAGNOSIS — I87333 Chronic venous hypertension (idiopathic) with ulcer and inflammation of bilateral lower extremity: Secondary | ICD-10-CM | POA: Insufficient documentation

## 2014-12-03 DIAGNOSIS — E785 Hyperlipidemia, unspecified: Secondary | ICD-10-CM | POA: Diagnosis not present

## 2014-12-03 DIAGNOSIS — R350 Frequency of micturition: Secondary | ICD-10-CM | POA: Diagnosis not present

## 2014-12-03 DIAGNOSIS — I2583 Coronary atherosclerosis due to lipid rich plaque: Secondary | ICD-10-CM

## 2014-12-03 DIAGNOSIS — I5032 Chronic diastolic (congestive) heart failure: Secondary | ICD-10-CM

## 2014-12-03 DIAGNOSIS — I2510 Atherosclerotic heart disease of native coronary artery without angina pectoris: Secondary | ICD-10-CM

## 2014-12-03 DIAGNOSIS — I878 Other specified disorders of veins: Secondary | ICD-10-CM

## 2014-12-03 LAB — POCT URINALYSIS DIPSTICK
Bilirubin, UA: NEGATIVE
Glucose, UA: NEGATIVE
Ketones, UA: NEGATIVE
NITRITE UA: NEGATIVE
Protein, UA: NEGATIVE
Spec Grav, UA: 1.01
UROBILINOGEN UA: 0.2
pH, UA: 6

## 2014-12-03 LAB — POCT UA - MICROSCOPIC ONLY

## 2014-12-03 LAB — GLUCOSE, CAPILLARY: Glucose-Capillary: 129 mg/dL — ABNORMAL HIGH (ref 70–99)

## 2014-12-03 LAB — POCT GLYCOSYLATED HEMOGLOBIN (HGB A1C): HEMOGLOBIN A1C: 6.7

## 2014-12-03 MED ORDER — BAG BALM OINTMENT: 1.0000 "application " | TOPICAL_OINTMENT | CUTANEOUS | Status: DC | PRN

## 2014-12-03 MED ORDER — BAG BALM OINTMENT
1.0000 | TOPICAL_OINTMENT | CUTANEOUS | Status: AC | PRN
Start: 2014-12-03 — End: ?

## 2014-12-03 MED ORDER — FERROUS SULFATE 325 (65 FE) MG PO TABS: 325.0000 mg | ORAL_TABLET | Freq: Three times a day (TID) | ORAL | Status: DC

## 2014-12-03 MED ORDER — TORSEMIDE 20 MG PO TABS: 40.0000 mg | ORAL_TABLET | Freq: Two times a day (BID) | ORAL | Status: DC

## 2014-12-03 NOTE — Patient Instructions (Signed)
It was nice to see you today.  I have started you on iron (take as directed).  Be sure to follow up with wound care. Ask them about Unnaboot.  Take all of your medications as prescribed.  Follow up in ~1-3 months.

## 2014-12-04 NOTE — Progress Notes (Signed)
   Subjective:    Patient ID: Anita [name], female    DOB: Sep 09, 1945, 69 y.o.   MRN: [id number]  HPI 69 year old female with a complicated PMH including anemia chronic disease, coronary artery disease status post NSTEMI x 3, CKD, COPD, chronic respiratory failure on home O2, diet-controlled diabetes mellitus, hyperlipidemia, and chronic diastolic heart failure presents for follow-up.  1) Chronic diastolic heart failure  Stable.  Patient at dry weight. SOB is stable.  No PND or orthopnea.  She is followed by cardiology. She is in need of refill on Torsemide today.  2) LE edema/ulceration  Patient with continued trouble with venous stasis and subsequent edema/ulceration.  She is currently being followed by wound care. She is compliant with compression stockings.  Daughter reports that she has significant itching following removal of her compression stockings.  This is improved by Bag balm.  There requesting Rx for this today.  3) CAD  Stable. No chest pain.  Compliant with Statin, Imdur, Aspirin.  4) DM-2  Controlled. In need of repeat A1C.   CBG's - Does not check.  Medications - None. Diet controlled.   Social Hx - Former smoker.   Review of Systems  Constitutional: Negative for fever and chills.  Respiratory: Positive for shortness of breath.        SOB is at baseline.   Cardiovascular: Positive for leg swelling. Negative for chest pain.      Objective:   Physical Exam Filed Vitals:   12/03/14 1130  BP: 153/50  Pulse: 102  Temp: 98.1 F (36.7 C)   Exam: General: Chronically ill-appearing obese female in no acute distress. Cardiovascular: RRR. No murmur. Respiratory: CTAB.  Extremities: 2+ LE edema. LE covered with compression  Stockings and  Ace bandage.    Assessment & Plan:  See Problem List

## 2014-12-05 DIAGNOSIS — I878 Other specified disorders of veins: Secondary | ICD-10-CM | POA: Insufficient documentation

## 2014-12-05 LAB — URINE CULTURE
Colony Count: NO GROWTH
Organism ID, Bacteria: NO GROWTH

## 2014-12-05 NOTE — Assessment & Plan Note (Signed)
A1c 6.7 today. Patient continues to be managed by diet alone. Will continue to monitor.

## 2014-12-05 NOTE — Assessment & Plan Note (Signed)
Stable with no evidence of volume overload at this time. Continuing torsemide. Refill sent today.

## 2014-12-05 NOTE — Assessment & Plan Note (Signed)
Stable and followed by cardiology. Continuing current medications.

## 2014-12-05 NOTE — Assessment & Plan Note (Signed)
With ulceration. Followed by wound care. Advised continued use of compression stockings. May benefit from an Unna boot at wound care.

## 2014-12-05 NOTE — Assessment & Plan Note (Signed)
Started Iron therapy today.

## 2014-12-05 NOTE — Assessment & Plan Note (Addendum)
Continuing atorvastatin.   Lipid panel hasn't been checked in 2 years. May benefit from recheck to reevaluate.   However, patient does not like much in the way of intervention and will likely decline further treatment if needed.

## 2014-12-06 ENCOUNTER — Telehealth: Payer: Self-pay | Admitting: Family Medicine

## 2014-12-06 NOTE — Telephone Encounter (Signed)
Need results from urine test/bmc

## 2014-12-06 NOTE — Telephone Encounter (Signed)
Culture negative

## 2014-12-07 NOTE — Telephone Encounter (Signed)
LVM for patien to call back

## 2014-12-08 ENCOUNTER — Other Ambulatory Visit: Payer: Self-pay | Admitting: Pulmonary Disease

## 2014-12-08 ENCOUNTER — Telehealth: Payer: Self-pay

## 2014-12-08 MED ORDER — ALBUTEROL SULFATE HFA 108 (90 BASE) MCG/ACT IN AERS: 2.0000 | INHALATION_SPRAY | Freq: Four times a day (QID) | RESPIRATORY_TRACT | Status: AC | PRN

## 2014-12-08 NOTE — Telephone Encounter (Signed)
Received electronic refill request for letrozole from [facility] mail order pharmacy. Chart shows last filled at [facility]. Asked pt to call back and clarify which pharmacy we need to send Rx to.

## 2014-12-09 NOTE — Telephone Encounter (Signed)
Pt is calling back about results jw

## 2014-12-09 NOTE — Telephone Encounter (Signed)
LVM for pt to return call to inform of below. (2nd attempt per notes) Anita [name], Rainier Feuerborn D

## 2014-12-09 NOTE — Telephone Encounter (Signed)
Tried return call to pt.  LVM for her to call back. Katharina Caper, Abby Stines D

## 2014-12-10 ENCOUNTER — Telehealth: Payer: Self-pay | Admitting: Hematology

## 2014-12-10 ENCOUNTER — Ambulatory Visit (HOSPITAL_BASED_OUTPATIENT_CLINIC_OR_DEPARTMENT_OTHER): Payer: Commercial Managed Care - HMO

## 2014-12-10 ENCOUNTER — Other Ambulatory Visit (HOSPITAL_COMMUNITY)
Admission: RE | Admit: 2014-12-10 | Discharge: 2014-12-10 | Disposition: A | Discharge status: Home / Self Care | Payer: Commercial Managed Care - HMO | Source: Ambulatory Visit | Attending: Hematology | Admitting: Hematology

## 2014-12-10 ENCOUNTER — Other Ambulatory Visit (HOSPITAL_BASED_OUTPATIENT_CLINIC_OR_DEPARTMENT_OTHER): Payer: Commercial Managed Care - HMO

## 2014-12-10 ENCOUNTER — Ambulatory Visit (HOSPITAL_BASED_OUTPATIENT_CLINIC_OR_DEPARTMENT_OTHER): Payer: Commercial Managed Care - HMO | Admitting: Hematology

## 2014-12-10 VITALS — BP 127/60 | HR 107 | Temp 97.8°F | Resp 17 | Ht 62.0 in | Wt 232.2 lb

## 2014-12-10 DIAGNOSIS — C7951 Secondary malignant neoplasm of bone: Secondary | ICD-10-CM | POA: Diagnosis not present

## 2014-12-10 DIAGNOSIS — C50911 Malignant neoplasm of unspecified site of right female breast: Secondary | ICD-10-CM

## 2014-12-10 DIAGNOSIS — C50919 Malignant neoplasm of unspecified site of unspecified female breast: Secondary | ICD-10-CM | POA: Insufficient documentation

## 2014-12-10 DIAGNOSIS — D72829 Elevated white blood cell count, unspecified: Secondary | ICD-10-CM

## 2014-12-10 DIAGNOSIS — N189 Chronic kidney disease, unspecified: Secondary | ICD-10-CM | POA: Diagnosis not present

## 2014-12-10 DIAGNOSIS — J449 Chronic obstructive pulmonary disease, unspecified: Secondary | ICD-10-CM

## 2014-12-10 DIAGNOSIS — D638 Anemia in other chronic diseases classified elsewhere: Secondary | ICD-10-CM

## 2014-12-10 DIAGNOSIS — I87333 Chronic venous hypertension (idiopathic) with ulcer and inflammation of bilateral lower extremity: Secondary | ICD-10-CM | POA: Diagnosis not present

## 2014-12-10 DIAGNOSIS — L97821 Non-pressure chronic ulcer of other part of left lower leg limited to breakdown of skin: Secondary | ICD-10-CM | POA: Diagnosis not present

## 2014-12-10 DIAGNOSIS — L97811 Non-pressure chronic ulcer of other part of right lower leg limited to breakdown of skin: Secondary | ICD-10-CM | POA: Diagnosis not present

## 2014-12-10 LAB — COMPREHENSIVE METABOLIC PANEL
ALT: 24 U/L (ref 0–35)
ANION GAP: 9 (ref 5–15)
AST: 24 U/L (ref 0–37)
Albumin: 3.5 g/dL (ref 3.5–5.2)
Alkaline Phosphatase: 104 U/L (ref 39–117)
BILIRUBIN TOTAL: 0.2 mg/dL — AB (ref 0.3–1.2)
BUN: 41 mg/dL — AB (ref 6–23)
CO2: 26 mmol/L (ref 19–32)
CREATININE: 1.97 mg/dL — AB (ref 0.50–1.10)
Calcium: 8.5 mg/dL (ref 8.4–10.5)
Chloride: 104 mmol/L (ref 96–112)
GFR calc Af Amer: 29 mL/min — ABNORMAL LOW (ref >90)
GFR, EST NON AFRICAN AMERICAN: 25 mL/min — AB (ref >90)
Glucose, Bld: 172 mg/dL — ABNORMAL HIGH (ref 70–99)
Potassium: 3.8 mmol/L (ref 3.5–5.1)
SODIUM: 139 mmol/L (ref 135–145)
Total Protein: 7.5 g/dL (ref 6.0–8.3)

## 2014-12-10 LAB — CBC & DIFF AND RETIC
BASO%: 0.2 % (ref 0.0–2.0)
BASOS ABS: 0 10*3/uL (ref 0.0–0.1)
EOS%: 1.9 % (ref 0.0–7.0)
Eosinophils Absolute: 0.1 10*3/uL (ref 0.0–0.5)
HCT: 27.2 % — ABNORMAL LOW (ref 34.8–46.6)
HGB: 8.5 g/dL — ABNORMAL LOW (ref 11.6–15.9)
IMMATURE RETIC FRACT: 24.8 % — AB (ref 1.60–10.00)
LYMPH#: 0.6 10*3/uL — AB (ref 0.9–3.3)
LYMPH%: 12.8 % — ABNORMAL LOW (ref 14.0–49.7)
MCH: 27.7 pg (ref 25.1–34.0)
MCHC: 31.3 g/dL — AB (ref 31.5–36.0)
MCV: 88.6 fL (ref 79.5–101.0)
MONO#: 0.3 10*3/uL (ref 0.1–0.9)
MONO%: 6.1 % (ref 0.0–14.0)
NEUT#: 3.8 10*3/uL (ref 1.5–6.5)
NEUT%: 79 % — ABNORMAL HIGH (ref 38.4–76.8)
Platelets: 231 10*3/uL (ref 145–400)
RBC: 3.07 10*6/uL — AB (ref 3.70–5.45)
RDW: 20.6 % — AB (ref 11.2–14.5)
RETIC CT ABS: 104.69 10*3/uL — AB (ref 33.70–90.70)
Retic %: 3.41 % — ABNORMAL HIGH (ref 0.70–2.10)
WBC: 4.8 10*3/uL (ref 3.9–10.3)

## 2014-12-10 LAB — IRON AND TIBC CHCC
%SAT: 14 % — ABNORMAL LOW (ref 21–57)
IRON: 39 ug/dL — AB (ref 41–142)
TIBC: 280 ug/dL (ref 236–444)
UIBC: 241 ug/dL (ref 120–384)

## 2014-12-10 LAB — FERRITIN CHCC: Ferritin: 255 ng/ml (ref 9–269)

## 2014-12-10 MED ORDER — LETROZOLE 2.5 MG PO TABS: 2.5000 mg | ORAL_TABLET | Freq: Every day | ORAL | Status: DC

## 2014-12-10 MED ORDER — DENOSUMAB 120 MG/1.7ML ~~LOC~~ SOLN
120.0000 mg | Freq: Once | SUBCUTANEOUS | Status: AC
  Administered 2014-12-10: 120 mg via SUBCUTANEOUS
  Filled 2014-12-10: qty 1.7

## 2014-12-10 NOTE — Patient Outreach (Signed)
[facility]) Care Management  12/10/2014  JAYD FORREY Mar 30, 1946 [id number]   Received notification from Maury Dus, RN to close case due to unable to contact patient for [facility] services.  Case closed at this time.  Ronnell Freshwater. [place] CM Assistant Phone: [phone] Fax: [phone]

## 2014-12-10 NOTE — Progress Notes (Deleted)
Anita [name] calcium level is low today (7.3)  Dr Alen Blew notified of this.  Will hold X-geva today.  He had not been taking calsium supplement on a regular basis.  Instructed him to take a tablet 2 times daily and we will recheck at next appointment.

## 2014-12-10 NOTE — Telephone Encounter (Signed)
Gave avs & calendar for May. °

## 2014-12-10 NOTE — Telephone Encounter (Signed)
Appointments made and avs printed for patient °

## 2014-12-10 NOTE — Addendum Note (Signed)
Addended by: Robet Leu on: 12/10/2014 03:42 PM   Modules accepted: Orders

## 2014-12-10 NOTE — Progress Notes (Signed)
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 She started letrozole in 11/2012.  Added Palbociclib 125 mg daily for 21 days on and 7 days off on 01/01/2014 with disease progression. Dose modified to 2 weeks on 1 week off with cycle 3.   2. zometa added on 01/01/2014 for metastatic bone disease. Zometa held due to renal insufficiency and changed to [facility] because of skeletal disease progression and safe renal profile. 3. Started palliative XRT to the left and right femur (30 Gy in 10 fractions) on 02/02/2014 per Dr. Gery Pray which was finished on 02/24/2014.  INTERVAL HISTORY:  Anita [name] 69 y.o. female with a history of right breast cancer with metastases to the bone/lungs here for follow-up.   Today, she came in for follow up by herself. She was seen by wound clinic one week ago, she still has a open wound in left low extremity, but it's getting better. She otherwise feels the same, dyspena is stable, on oxygen, goes to school as [employer]. No other new complains. Her energy level is fair. No fever or chills. She started Palbo this week.   MEDICAL HISTORY: Past Medical History  Diagnosis Date  . Fibroid   . Morbid obesity   . 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 COPD (chronic obstructive pulmonary disease)   . Chronic diastolic CHF (congestive heart failure)   . NSTEMI (non-ST elevated myocardial infarction)     Cath November 2014 LAD 40% stenosis, first diagonal 80% stenosis, circumflex 20% stenosis, right coronary artery occluded. The EF was 35-40% at that time.  . Anemia     a. Adm 09/2012 with melena, Hgb 5.8 -> transfused. EGD/colonoscopy unrevealing.  . Breast cancer       a. Mets to bone. ER 100%/ PR 0%/Her2 neu negative.  [name] Ulcers of both lower legs   . Tobacco abuse   . Hyperlipidemia 02/11/2013  . Chronic respiratory failure     a. On O2 qhs. also portable O2.  . Chronic renal insufficiency   . Lesion of vocal cord     a. CT 05/2013 concerning for tumor.  . Shortness of breath   . Depression   . Diabetes mellitus without complication   . Anginal pain   . Dementia     very mild  . Radiation 02/02/14-02/24/14    left and right femur 30 gray    ALLERGIES:  is allergic to omnipaque and benzene.  MEDICATIONS:    Medication List       This list is accurate as of: 12/10/14  3:14 PM.  Always use your most recent med list.               albuterol 108 (90 BASE) MCG/ACT inhaler  Commonly known as:  PROVENTIL HFA;VENTOLIN HFA  Inhale 2 puffs into the lungs every 6 (six) hours as needed for wheezing or shortness of breath.     aspirin 81 MG EC tablet  Take 1 tablet (81 mg total) by mouth daily.     atorvastatin 40 MG tablet  Commonly known as:  LIPITOR  Take 40 mg by mouth daily.     bag balm Oint ointment  Apply  1 g topically as needed for dry skin or irritation.     clotrimazole 1 % cream  Commonly known as:  LOTRIMIN  Apply 1 application topically daily.     docusate sodium 100 MG capsule  Commonly known as:  COLACE  Take 100 mg by mouth daily.     doxycycline 100 MG capsule  Commonly known as:  VIBRAMYCIN     ferrous sulfate 325 (65 FE) MG tablet  Take 1 tablet (325 mg total) by mouth 3 (three) times daily.     Hydrocodone-Acetaminophen 5-300 MG Tabs  Commonly known as:  VICODIN  Take 1 tablet by mouth every 6 (six) hours as needed.     IBRANCE 125 MG capsule  Generic drug:  palbociclib  Take 125 mg by mouth See admin instructions. Take 125mg  every morning for 2 weeks, hold for a week, then repeat. Take whole with food.     isosorbide mononitrate 60 MG 24 hr tablet  Commonly known as:  IMDUR  Take 1 tablet (60 mg total) by  mouth daily.     letrozole 2.5 MG tablet  Commonly known as:  FEMARA  Take 1 tablet (2.5 mg total) by mouth daily.     mirtazapine 15 MG tablet  Commonly known as:  REMERON  Take 1 tablet (15 mg total) by mouth at bedtime.     nitroGLYCERIN 0.4 MG SL tablet  Commonly known as:  NITROSTAT  Place 1 tablet (0.4 mg total) under the tongue every 5 (five) minutes as needed for chest pain.     NON FORMULARY  Place 3 L into the nose as needed (oxygen).     potassium chloride SA 20 MEQ tablet  Commonly known as:  K-DUR,KLOR-CON  Take 2 tablets (40 mEq total) by mouth 2 (two) times daily.     SPIRIVA HANDIHALER 18 MCG inhalation capsule  Generic drug:  tiotropium     torsemide 20 MG tablet  Commonly known as:  DEMADEX  Take 2-4 tablets (40-80 mg total) by mouth 2 (two) times daily. 80mg  in the morning and 40mg  in the evening     VITAMIN D (CHOLECALCIFEROL) PO  Take 1,000 mg by mouth daily.        SURGICAL HISTORY:  Past Surgical History  Procedure Laterality Date  . Colonoscopy, esophagogastroduodenoscopy (egd) and esophageal dilation N/A 10/13/2012    Procedure: colonoscopy and egd;  Surgeon: Wonda Horner, MD;  Location: [facility];  Service: Endoscopy;  Laterality: N/A;  . Left heart catheterization with coronary angiogram N/A 07/07/2013    Procedure: LEFT HEART CATHETERIZATION WITH CORONARY ANGIOGRAM;  Surgeon: Peter M Martinique, MD;  Location: [facility];  Service: Cardiovascular;  Laterality: N/A;   REVIEW OF SYSTEMS:   Constitutional: Denies fevers, chills or abnormal weight loss Eyes: Denies blurriness of vision Ears, nose, mouth, throat, and face: Denies mucositis or sore throat Respiratory: Denies cough, dyspnea or wheezes Cardiovascular: Denies palpitation, chest discomfort or lower extremity swelling Gastrointestinal:  Denies nausea, heartburn or change in bowel habits Skin: Denies abnormal skin rashes Lymphatics: Denies new lymphadenopathy or easy  bruising Neurological:Denies numbness, tingling or new weaknesses Behavioral/Psych: Mood is stable, no new changes  All other systems were reviewed with the patient and are negative.  PHYSICAL EXAMINATION: ECOG PERFORMANCE STATUS: 2  Blood pressure 127/60, pulse 107, temperature 97.8 F (36.6 C), temperature source Oral, resp. rate 17, height 5\' 2"  (1.575 m), weight 232 lb 3.2 oz (105.325 kg), SpO2 95 %, peak flow 3  L/min.  GENERAL:alert, no distress and comfortable morbidly obese woman in wheelchair, chronically ill appearing SKIN: skin color, texture, turgor are normal; posterior legs with irritation and erythema with a small abrasion on the posterior left thigh with mild TTP.  EYES: normal, Conjunctiva are pink and non-injected, sclera clear OROPHARYNX:no exudate, no erythema and lips, buccal mucosa, and tongue normal  NECK: supple, thyroid normal size, non-tender, without nodularity LYMPH:  no palpable lymphadenopathy in the cervical, axillary LUNGS: coarse breathing with slightly increased breathing rate HEART: regular rate & rhythm and no murmurs and 2+ lower extremity edema BREAST: deferred ABDOMEN:abdomen soft, non-tender and normal bowel sounds; obese Musculoskeletal:no cyanosis of digits and no clubbing; bilateral lower extremities are wrapped with gauze and a bandage up to mid calf. NEURO: alert & oriented x 3 with fluent speech, no focal motor/sensory deficits  LABORATORY DATA: CBC Latest Ref Rng 12/10/2014 11/05/2014 10/15/2014  WBC 3.9 - 10.3 10e3/uL 4.8 3.1(L) 2.6(L)  Hemoglobin 11.6 - 15.9 g/dL 8.5(L) 9.0(L) 8.5(L)  Hematocrit 34.8 - 46.6 % 27.2(L) 28.4(L) 27.1(L)  Platelets 145 - 400 10e3/uL 231 162 205    CMP Latest Ref Rng 12/10/2014 11/05/2014 10/15/2014  Glucose 70 - 99 mg/dL 172(H) 198(H) 185(H)  BUN 6 - 23 mg/dL 41(H) 29.5(H) 37.7(H)  Creatinine 0.50 - 1.10 mg/dL 1.97(H) 1.6(H) 1.7(H)  Sodium 135 - 145 mmol/L 139 141 141  Potassium 3.5 - 5.1 mmol/L 3.8 3.8 3.7   Chloride 96 - 112 mmol/L 104 - -  CO2 19 - 32 mmol/L $RemoveB'26 26 25  'BTEMTCAU$ Calcium 8.4 - 10.5 mg/dL 8.5 9.0 9.3  Total Protein 6.0 - 8.3 g/dL 7.5 6.8 6.8  Total Bilirubin 0.3 - 1.2 mg/dL 0.2(L) 0.36 0.32  Alkaline Phos 39 - 117 U/L 104 94 89  AST 0 - 37 U/L $Remo'24 20 21  'zvaYZ$ ALT 0 - 35 U/L $Remo'24 24 20    'kHmIG$ CA 27.29 (Order [id number])      CA 27.29  Status: Finalresult Visible to patient:  Not Released Nextappt: 01/07/2015 at 12:45 PM in Oncology ([facility]) Dx:  Anemia of chronic disease; Breast can...              Ref Range 6d ago  69mo ago  48mo ago     CA 27.29 0 - 39 U/mL 62 (H) 46 (H) 41 (H)           RADIOGRAPHIC STUDIES:  CT chest abdomen and pelvis 09/01/2014 IMPRESSION: Chest Impression:  1. Stable exam of thorax. 2. Several small smudgy pulmonary nodules are not changed. 3. Stable widespread bony metastasis pneumothorax.  Abdomen / Pelvis Impression:  1. Stable small periaortic retroperitoneal lymph node. 2. No evidence disease progression. 3. Stable widespread skeletal metastasis. 4. Left sided staghorn calculus again demonstrated. 5. Nodular focus in the right kidney at site of prior renal cyst is not changed  ASSESSMENT: Metastatic breast cancer on palliative letrozole plus Palbociclib and Xgeva for bone mets.   PLAN:  1.  Metastatic breast cancer (ER pos/PR pos/ HER2 neg) with metastasis to bones and lungs.  --She is clinically stable, metastatic bone pain much improved after palliating of radiation. -Her restaging CT chest, abdomen and pelvis on 09/01/2014 showed stable disease overall. - She is tolerating treatment well. Normal WBC and platelet count today, moderate anemia stable. -Continue letrozole and Palbociclib 125 mg daily, 2 week on, 1 week off. We'll follow up his CBC closely. -restaging in 2 month   2. bone metastasis - continue Xgeva monthly  -  Continue calcium and vitamin D  3. 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    --She has been seen by radiation oncology. She has declined intervention.  She is being followed by Dr. Sondra Come.   5. Leukopenia/Anemia of chronic disease  -- Past work up for reversible causes of anemia were negative.  -Stable moderate anemia -Repeated ferritin 255, serum iron 39 and saturation 14%,  -Continue ferrous sulfate 3 times daily  6. CHF --She is not fluid overloaded today on exam. She is on Lasix, nitrogylerin prn, losartan. Increase lasix prn and weight daily. Cards follow up.   7. Diabetes mellitus, type II. Diet controlled.   8. COPD: On albuterol prn. She remains on home oxygen.  9. Chronic renal insufficiency.  -Stable.  10: Bilateral leg wound -improved lately  -Follow up with wound clinic  All questions were answered. The patient knows to call the clinic with any problems, questions or concerns. We can certainly see the patient much sooner if necessary.  Plan: -RTC in 1 month for follow up, with lab and xgeva injection  -copy Dr. Percival Spanish and PCP   I spent 25 minutes counseling the patient face to face. The total time spent in the appointment was 30 minutes.   Truitt Merle  12/10/2014

## 2014-12-11 LAB — CANCER ANTIGEN 27.29: CA 27.29: 62 U/mL — AB (ref 0–39)

## 2014-12-13 ENCOUNTER — Telehealth: Payer: Self-pay | Admitting: Family Medicine

## 2014-12-13 NOTE — Telephone Encounter (Signed)
Spoke with patient and gave negative culture result

## 2014-12-13 NOTE — Telephone Encounter (Signed)
Pt calling about results / Fonda Kinder, ASA

## 2014-12-13 NOTE — Telephone Encounter (Signed)
Would like for Anita [name] to return her call about results / thanks Fonda Kinder, ASA

## 2014-12-13 NOTE — Telephone Encounter (Signed)
Attempted to call patient, please let her know that her Urine culture is negative

## 2014-12-16 ENCOUNTER — Encounter: Payer: Self-pay | Admitting: Hematology

## 2014-12-17 ENCOUNTER — Telehealth: Payer: Self-pay | Admitting: *Deleted

## 2014-12-17 ENCOUNTER — Other Ambulatory Visit: Payer: Self-pay | Admitting: *Deleted

## 2014-12-17 ENCOUNTER — Telehealth: Payer: Self-pay | Admitting: Cardiology

## 2014-12-17 ENCOUNTER — Telehealth: Payer: Self-pay | Admitting: Family Medicine

## 2014-12-17 DIAGNOSIS — I87333 Chronic venous hypertension (idiopathic) with ulcer and inflammation of bilateral lower extremity: Secondary | ICD-10-CM | POA: Diagnosis not present

## 2014-12-17 DIAGNOSIS — L97821 Non-pressure chronic ulcer of other part of left lower leg limited to breakdown of skin: Secondary | ICD-10-CM | POA: Diagnosis not present

## 2014-12-17 DIAGNOSIS — C7951 Secondary malignant neoplasm of bone: Principal | ICD-10-CM

## 2014-12-17 DIAGNOSIS — L97811 Non-pressure chronic ulcer of other part of right lower leg limited to breakdown of skin: Secondary | ICD-10-CM | POA: Diagnosis not present

## 2014-12-17 DIAGNOSIS — C50919 Malignant neoplasm of unspecified site of unspecified female breast: Secondary | ICD-10-CM

## 2014-12-17 LAB — GLUCOSE, CAPILLARY: Glucose-Capillary: 97 mg/dL (ref 70–99)

## 2014-12-17 MED ORDER — MIRTAZAPINE 15 MG PO TABS: 15.0000 mg | ORAL_TABLET | Freq: Every day | ORAL | Status: DC

## 2014-12-17 NOTE — Telephone Encounter (Signed)
If patient is referring to clearance, I will need paperwork from [facility] physicians office.

## 2014-12-17 NOTE — Telephone Encounter (Signed)
Ms.Perusse called in stating that Dr. Einar Gip wants her to have Cataract surgery and she would like to know if Dr. Percival Spanish approves of this. Please f/u with her   Thanks

## 2014-12-17 NOTE — Telephone Encounter (Signed)
TC from patient regarding her eye appointment. Dr. Einar Gip [phone]). He is recommending cataract surgery for patient and patient would like an ok from Dr. Burr Medico. Please advise

## 2014-12-17 NOTE — Telephone Encounter (Signed)
OK with cataract surgery, better to do it when she is off Ibrance. please let her know.  Truitt Merle  12/17/2014

## 2014-12-17 NOTE — Telephone Encounter (Signed)
Eye dr says she needs cataract surgery but needs dr cooks approval

## 2014-12-17 NOTE — Telephone Encounter (Signed)
Called patient. Would be undergoing anesthesia for cataract removal.  Informed pt we would need faxed clearance request from her eye surgeon prior to procedure. Advised to have them contact us & submit info. She voiced understanding.

## 2014-12-20 NOTE — Telephone Encounter (Signed)
Tried to contact pt to inform her of below but there was no answer and no machine. Anita [name], April D

## 2014-12-21 ENCOUNTER — Telehealth: Payer: Self-pay | Admitting: *Deleted

## 2014-12-21 NOTE — Telephone Encounter (Signed)
Attempted to call pt several times on both cell phone and home phone.  Left messages x 2 on cell phone voice mail requesting a call back from pt re:  Per Dr. Burr Medico -  OK to have cataract surgery; better to do it when she is off Ibrance.

## 2014-12-23 ENCOUNTER — Telehealth: Payer: Self-pay | Admitting: *Deleted

## 2014-12-23 NOTE — Telephone Encounter (Signed)
Called husband on cell phone.  Asked husband to have pt call nurse back for further instructions on cataract surgery.  Husband stated he would relay message to pt.

## 2014-12-24 ENCOUNTER — Telehealth: Payer: Self-pay | Admitting: *Deleted

## 2014-12-24 DIAGNOSIS — I87333 Chronic venous hypertension (idiopathic) with ulcer and inflammation of bilateral lower extremity: Secondary | ICD-10-CM | POA: Diagnosis not present

## 2014-12-24 LAB — GLUCOSE, CAPILLARY: GLUCOSE-CAPILLARY: 112 mg/dL — AB (ref 70–99)

## 2014-12-24 NOTE — Telephone Encounter (Signed)
Received voice message from patient stating,"I'm returning Dr. Ernestina Penna nurses call regarding cataract surgery. Thank You." This message to be routed to Dr. Ernestina Penna nurses.

## 2014-12-24 NOTE — Telephone Encounter (Signed)
Pt called to inquire about the PCP's response, I informed her of the below messages and she said "Okay" / Anita [name], ASA

## 2014-12-28 ENCOUNTER — Telehealth: Payer: Self-pay | Admitting: *Deleted

## 2014-12-28 NOTE — Telephone Encounter (Signed)
Called pt on cell phone and left message on voice mail re:  Per Dr. Burr Medico,  [place] to have cataract surgery,  Better to do it when pt is off Ibrance.    Asked pt to call nurse back to confirm that she received message.

## 2014-12-31 ENCOUNTER — Encounter (HOSPITAL_BASED_OUTPATIENT_CLINIC_OR_DEPARTMENT_OTHER): Payer: Commercial Managed Care - HMO | Attending: Internal Medicine

## 2014-12-31 DIAGNOSIS — L97221 Non-pressure chronic ulcer of left calf limited to breakdown of skin: Secondary | ICD-10-CM | POA: Insufficient documentation

## 2014-12-31 DIAGNOSIS — L97211 Non-pressure chronic ulcer of right calf limited to breakdown of skin: Secondary | ICD-10-CM | POA: Diagnosis not present

## 2014-12-31 DIAGNOSIS — I872 Venous insufficiency (chronic) (peripheral): Secondary | ICD-10-CM | POA: Diagnosis present

## 2014-12-31 DIAGNOSIS — I83202 Varicose veins of unspecified lower extremity with both ulcer of calf and inflammation: Secondary | ICD-10-CM | POA: Diagnosis not present

## 2014-12-31 LAB — GLUCOSE, CAPILLARY: Glucose-Capillary: 193 mg/dL — ABNORMAL HIGH (ref 70–99)

## 2015-01-05 ENCOUNTER — Telehealth: Payer: Self-pay | Admitting: Family Medicine

## 2015-01-05 DIAGNOSIS — C50911 Malignant neoplasm of unspecified site of right female breast: Secondary | ICD-10-CM

## 2015-01-05 DIAGNOSIS — I83202 Varicose veins of unspecified lower extremity with both ulcer of calf and inflammation: Secondary | ICD-10-CM | POA: Diagnosis not present

## 2015-01-05 LAB — GLUCOSE, CAPILLARY: GLUCOSE-CAPILLARY: 188 mg/dL — AB (ref 70–99)

## 2015-01-05 NOTE — Telephone Encounter (Signed)
[name] called and would like a referral for the patient since she has an appointment on 01/07/15. Anita [name]

## 2015-01-05 NOTE — Telephone Encounter (Signed)
They are probably talking about a [employer] approval.  Will forward to MD to place referral first. Fleeger, Salome Spotted

## 2015-01-07 ENCOUNTER — Ambulatory Visit (HOSPITAL_BASED_OUTPATIENT_CLINIC_OR_DEPARTMENT_OTHER): Payer: Commercial Managed Care - HMO

## 2015-01-07 ENCOUNTER — Ambulatory Visit: Payer: Commercial Managed Care - HMO

## 2015-01-07 ENCOUNTER — Encounter: Payer: Self-pay | Admitting: Hematology

## 2015-01-07 ENCOUNTER — Telehealth: Payer: Self-pay | Admitting: Family Medicine

## 2015-01-07 ENCOUNTER — Telehealth: Payer: Self-pay | Admitting: Cardiology

## 2015-01-07 ENCOUNTER — Ambulatory Visit (HOSPITAL_BASED_OUTPATIENT_CLINIC_OR_DEPARTMENT_OTHER): Payer: Commercial Managed Care - HMO | Admitting: Hematology

## 2015-01-07 ENCOUNTER — Telehealth: Payer: Self-pay | Admitting: Hematology

## 2015-01-07 ENCOUNTER — Other Ambulatory Visit (HOSPITAL_BASED_OUTPATIENT_CLINIC_OR_DEPARTMENT_OTHER): Payer: Commercial Managed Care - HMO

## 2015-01-07 VITALS — BP 126/56 | HR 90 | Temp 98.6°F | Resp 22 | Ht 62.0 in | Wt 234.0 lb

## 2015-01-07 DIAGNOSIS — C7951 Secondary malignant neoplasm of bone: Secondary | ICD-10-CM

## 2015-01-07 DIAGNOSIS — C50919 Malignant neoplasm of unspecified site of unspecified female breast: Secondary | ICD-10-CM

## 2015-01-07 DIAGNOSIS — N189 Chronic kidney disease, unspecified: Secondary | ICD-10-CM

## 2015-01-07 DIAGNOSIS — I509 Heart failure, unspecified: Secondary | ICD-10-CM

## 2015-01-07 DIAGNOSIS — E119 Type 2 diabetes mellitus without complications: Secondary | ICD-10-CM

## 2015-01-07 DIAGNOSIS — D638 Anemia in other chronic diseases classified elsewhere: Secondary | ICD-10-CM

## 2015-01-07 DIAGNOSIS — C50911 Malignant neoplasm of unspecified site of right female breast: Secondary | ICD-10-CM

## 2015-01-07 DIAGNOSIS — C7800 Secondary malignant neoplasm of unspecified lung: Secondary | ICD-10-CM

## 2015-01-07 DIAGNOSIS — D72819 Decreased white blood cell count, unspecified: Secondary | ICD-10-CM

## 2015-01-07 DIAGNOSIS — I83202 Varicose veins of unspecified lower extremity with both ulcer of calf and inflammation: Secondary | ICD-10-CM | POA: Diagnosis not present

## 2015-01-07 DIAGNOSIS — J449 Chronic obstructive pulmonary disease, unspecified: Secondary | ICD-10-CM

## 2015-01-07 LAB — CBC & DIFF AND RETIC
BASO%: 1.3 % (ref 0.0–2.0)
Basophils Absolute: 0.1 10*3/uL (ref 0.0–0.1)
EOS%: 2.1 % (ref 0.0–7.0)
Eosinophils Absolute: 0.1 10*3/uL (ref 0.0–0.5)
HCT: 26.2 % — ABNORMAL LOW (ref 34.8–46.6)
HGB: 8.4 g/dL — ABNORMAL LOW (ref 11.6–15.9)
Immature Retic Fract: 16 % — ABNORMAL HIGH (ref 1.60–10.00)
LYMPH%: 9.7 % — ABNORMAL LOW (ref 14.0–49.7)
MCH: 27.9 pg (ref 25.1–34.0)
MCHC: 31.9 g/dL (ref 31.5–36.0)
MCV: 87.5 fL (ref 79.5–101.0)
MONO#: 0.3 10*3/uL (ref 0.1–0.9)
MONO%: 7.5 % (ref 0.0–14.0)
NEUT%: 79.4 % — AB (ref 38.4–76.8)
NEUTROS ABS: 3.4 10*3/uL (ref 1.5–6.5)
Platelets: 249 10*3/uL (ref 145–400)
RBC: 2.99 10*6/uL — ABNORMAL LOW (ref 3.70–5.45)
RDW: 20.2 % — ABNORMAL HIGH (ref 11.2–14.5)
RETIC %: 3.59 % — AB (ref 0.70–2.10)
Retic Ct Abs: 107.34 10*3/uL — ABNORMAL HIGH (ref 33.70–90.70)
WBC: 4.3 10*3/uL (ref 3.9–10.3)
lymph#: 0.4 10*3/uL — ABNORMAL LOW (ref 0.9–3.3)

## 2015-01-07 LAB — COMPREHENSIVE METABOLIC PANEL (CC13)
ALBUMIN: 3 g/dL — AB (ref 3.5–5.0)
ALK PHOS: 92 U/L (ref 40–150)
ALT: 51 U/L (ref 0–55)
ANION GAP: 13 meq/L — AB (ref 3–11)
AST: 52 U/L — AB (ref 5–34)
BILIRUBIN TOTAL: 0.34 mg/dL (ref 0.20–1.20)
BUN: 23.6 mg/dL (ref 7.0–26.0)
CO2: 27 mEq/L (ref 22–29)
Calcium: 8.4 mg/dL (ref 8.4–10.4)
Chloride: 101 mEq/L (ref 98–109)
Creatinine: 1.7 mg/dL — ABNORMAL HIGH (ref 0.6–1.1)
EGFR: 31 mL/min/{1.73_m2} — ABNORMAL LOW (ref >90)
GLUCOSE: 273 mg/dL — AB (ref 70–140)
Potassium: 3.5 mEq/L (ref 3.5–5.1)
Sodium: 141 mEq/L (ref 136–145)
Total Protein: 6.2 g/dL — ABNORMAL LOW (ref 6.4–8.3)

## 2015-01-07 LAB — TECHNOLOGIST REVIEW

## 2015-01-07 MED ORDER — DENOSUMAB 120 MG/1.7ML ~~LOC~~ SOLN
120.0000 mg | Freq: Once | SUBCUTANEOUS | Status: AC
  Administered 2015-01-07: 120 mg via SUBCUTANEOUS
  Filled 2015-01-07: qty 1.7

## 2015-01-07 NOTE — Telephone Encounter (Signed)
Pt asking to speak with Dr. Lacinda Axon, was seen at the cancer center and was told her hemoglobin was 8.5, they mentioned her having a blood transfusion per patient, wants to speak with Dr. Lacinda Axon first.

## 2015-01-07 NOTE — Telephone Encounter (Signed)
Referral to Onc placed.

## 2015-01-07 NOTE — Telephone Encounter (Signed)
New Message   Patient needs to speak to nurse /provider about having a blood transfusion, please give patient a call back

## 2015-01-07 NOTE — Progress Notes (Signed)
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 She started letrozole in 11/2012.  Added Palbociclib 125 mg daily for 21 days on and 7 days off on 01/01/2014 with disease progression. Dose modified to 2 weeks on 1 week off with cycle 3.   2. zometa added on 01/01/2014 for metastatic bone disease. Zometa held due to renal insufficiency and changed to [facility] because of skeletal disease progression and safe renal profile. 3. Started palliative XRT to the left and right femur (30 Gy in 10 fractions) on 02/02/2014 per Dr. Gery Pray which was finished on 02/24/2014.  INTERVAL HISTORY:  Anita [name] 69 y.o. female with a history of right breast cancer with metastases to the bone/lungs here for follow-up.   Today, she came in for follow up by herself. She is clinically stable. Her recently finished her school. Her fatigue is slightly worse lately. She still able to do her daily activities as usual. She denies chest pain. She will follow with home clinic once a week, and her skin once on both legs are getting better. She is being tolerating letrozole and Ibrance well.  No other new complaints.  MEDICAL HISTORY: Past Medical History  Diagnosis Date  . Fibroid   . Morbid obesity   . 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 COPD (chronic obstructive pulmonary disease)   . Chronic diastolic CHF (congestive heart failure)   . NSTEMI (non-ST elevated myocardial infarction)     Cath November 2014 LAD 40% stenosis, first diagonal 80% stenosis, circumflex 20% stenosis, right coronary artery occluded. The EF was 35-40% at that time.  . Anemia     a. Adm 09/2012 with melena, Hgb 5.8 -> transfused.  EGD/colonoscopy unrevealing.  . Breast cancer     a. Mets to bone. ER 100%/ PR 0%/Her2 neu negative.  [name] Ulcers of both lower legs   . Tobacco abuse   . Hyperlipidemia 02/11/2013  . Chronic respiratory failure     a. On O2 qhs. also portable O2.  . Chronic renal insufficiency   . Lesion of vocal cord     a. CT 05/2013 concerning for tumor.  . Shortness of breath   . Depression   . Diabetes mellitus without complication   . Anginal pain   . Dementia     very mild  . Radiation 02/02/14-02/24/14    left and right femur 30 gray    ALLERGIES:  is allergic to omnipaque and benzene.  MEDICATIONS:    Medication List       This list is accurate as of: 01/07/15  1:47 PM.  Always use your most recent med list.               albuterol 108 (90 BASE) MCG/ACT inhaler  Commonly known as:  PROVENTIL HFA;VENTOLIN HFA  Inhale 2 puffs into the lungs every 6 (six) hours as needed for wheezing or shortness of breath.     aspirin 81 MG EC tablet  Take 1 tablet (81 mg total) by mouth daily.     atorvastatin 40 MG tablet  Commonly known as:  LIPITOR  Take 40 mg by mouth daily.     bag balm Oint  ointment  Apply 1 g topically as needed for dry skin or irritation.     clotrimazole 1 % cream  Commonly known as:  LOTRIMIN  Apply 1 application topically daily.     docusate sodium 100 MG capsule  Commonly known as:  COLACE  Take 100 mg by mouth daily.     ferrous sulfate 325 (65 FE) MG tablet  Take 1 tablet (325 mg total) by mouth 3 (three) times daily.     Hydrocodone-Acetaminophen 5-300 MG Tabs  Commonly known as:  VICODIN  Take 1 tablet by mouth every 6 (six) hours as needed.     IBRANCE 125 MG capsule  Generic drug:  palbociclib  Take 125 mg by mouth See admin instructions. Take 125mg  every morning for 2 weeks, hold for a week, then repeat. Take whole with food.     isosorbide mononitrate 60 MG 24 hr tablet  Commonly known as:  IMDUR  Take 1 tablet (60 mg total) by mouth daily.       letrozole 2.5 MG tablet  Commonly known as:  FEMARA  Take 1 tablet (2.5 mg total) by mouth daily.     mirtazapine 15 MG tablet  Commonly known as:  REMERON  Take 1 tablet (15 mg total) by mouth at bedtime.     nitroGLYCERIN 0.4 MG SL tablet  Commonly known as:  NITROSTAT  Place 1 tablet (0.4 mg total) under the tongue every 5 (five) minutes as needed for chest pain.     NON FORMULARY  Place 3 L into the nose as needed (oxygen).     potassium chloride SA 20 MEQ tablet  Commonly known as:  K-DUR,KLOR-CON  Take 2 tablets (40 mEq total) by mouth 2 (two) times daily.     SPIRIVA HANDIHALER 18 MCG inhalation capsule  Generic drug:  tiotropium     torsemide 20 MG tablet  Commonly known as:  DEMADEX  Take 2-4 tablets (40-80 mg total) by mouth 2 (two) times daily. 80mg  in the morning and 40mg  in the evening     VITAMIN D (CHOLECALCIFEROL) PO  Take 1,000 mg by mouth daily.        SURGICAL HISTORY:  Past Surgical History  Procedure Laterality Date  . Colonoscopy, esophagogastroduodenoscopy (egd) and esophageal dilation N/A 10/13/2012    Procedure: colonoscopy and egd;  Surgeon: Wonda Horner, MD;  Location: [facility];  Service: Endoscopy;  Laterality: N/A;  . Left heart catheterization with coronary angiogram N/A 07/07/2013    Procedure: LEFT HEART CATHETERIZATION WITH CORONARY ANGIOGRAM;  Surgeon: Peter M Martinique, MD;  Location: [facility] CATH LAB;  Service: Cardiovascular;  Laterality: N/A;   REVIEW OF SYSTEMS:   Constitutional: Denies fevers, chills or abnormal weight loss Eyes: Denies blurriness of vision Ears, nose, mouth, throat, and face: Denies mucositis or sore throat Respiratory: Denies cough, dyspnea or wheezes Cardiovascular: Denies palpitation, chest discomfort or lower extremity swelling Gastrointestinal:  Denies nausea, heartburn or change in bowel habits Skin: Denies abnormal skin rashes Lymphatics: Denies new lymphadenopathy or easy bruising Neurological:Denies  numbness, tingling or new weaknesses Behavioral/Psych: Mood is stable, no new changes  All other systems were reviewed with the patient and are negative.  PHYSICAL EXAMINATION: ECOG PERFORMANCE STATUS: 2  Blood pressure 126/56, pulse 90, temperature 98.6 F (37 C), temperature source Oral, resp. rate 22, height 5\' 2"  (1.575 m), weight 234 lb (106.142 kg), SpO2 96 %.  GENERAL:alert, no distress and comfortable morbidly obese woman in wheelchair, chronically ill appearing SKIN:  skin color, texture, turgor are normal; posterior legs with irritation and erythema with a small abrasion on the posterior left thigh with mild TTP.  EYES: normal, Conjunctiva are pink and non-injected, sclera clear OROPHARYNX:no exudate, no erythema and lips, buccal mucosa, and tongue normal  NECK: supple, thyroid normal size, non-tender, without nodularity LYMPH:  no palpable lymphadenopathy in the cervical, axillary LUNGS: coarse breathing with slightly increased breathing rate HEART: regular rate & rhythm and no murmurs and 2+ lower extremity edema BREAST: deferred ABDOMEN:abdomen soft, non-tender and normal bowel sounds; obese Musculoskeletal:no cyanosis of digits and no clubbing; bilateral lower extremities are wrapped with gauze and a bandage up to mid calf. NEURO: alert & oriented x 3 with fluent speech, no focal motor/sensory deficits  LABORATORY DATA: CBC Latest Ref Rng 01/07/2015 12/10/2014 11/05/2014  WBC 3.9 - 10.3 10e3/uL 4.3 4.8 3.1(L)  Hemoglobin 11.6 - 15.9 g/dL 8.4(L) 8.5(L) 9.0(L)  Hematocrit 34.8 - 46.6 % 26.2(L) 27.2(L) 28.4(L)  Platelets 145 - 400 10e3/uL 249 231 162    CMP Latest Ref Rng 01/07/2015 12/10/2014 11/05/2014  Glucose 70 - 140 mg/dl 273(H) 172(H) 198(H)  BUN 7.0 - 26.0 mg/dL 23.6 41(H) 29.5(H)  Creatinine 0.6 - 1.1 mg/dL 1.7(H) 1.97(H) 1.6(H)  Sodium 136 - 145 mEq/L 141 139 141  Potassium 3.5 - 5.1 mEq/L 3.5 3.8 3.8  Chloride 96 - 112 mmol/L - 104 -  CO2 22 - 29 mEq/L $Remove'27 26 26    'CoHccPG$ Calcium 8.4 - 10.4 mg/dL 8.4 8.5 9.0  Total Protein 6.4 - 8.3 g/dL 6.2(L) 7.5 6.8  Total Bilirubin 0.20 - 1.20 mg/dL 0.34 0.2(L) 0.36  Alkaline Phos 40 - 150 U/L 92 104 94  AST 5 - 34 U/L 52(H) 24 20  ALT 0 - 55 U/L 51 24 24    CA 27.29 (Order [id number])      CA 27.29  Status: Finalresult Visible to patient:  Not Released Nextappt: 01/07/2015 at 12:45 PM in Oncology ([facility]) Dx:  Anemia of chronic disease; Breast can...              Ref Range 6d ago  91mo ago  65mo ago     CA 27.29 0 - 39 U/mL 62 (H) 46 (H) 41 (H)           RADIOGRAPHIC STUDIES:  CT chest abdomen and pelvis 09/01/2014 IMPRESSION: Chest Impression:  1. Stable exam of thorax. 2. Several small smudgy pulmonary nodules are not changed. 3. Stable widespread bony metastasis pneumothorax.  Abdomen / Pelvis Impression:  1. Stable small periaortic retroperitoneal lymph node. 2. No evidence disease progression. 3. Stable widespread skeletal metastasis. 4. Left sided staghorn calculus again demonstrated. 5. Nodular focus in the right kidney at site of prior renal cyst is not changed  ASSESSMENT: Metastatic breast cancer on palliative letrozole plus Palbociclib and Xgeva for bone mets.   PLAN:  1.  Metastatic breast cancer (ER pos/PR pos/ HER2 neg) with metastasis to bones and lungs.  --She is clinically stable, metastatic bone pain much improved after palliating of radiation. -Her restaging CT chest, abdomen and pelvis on 09/01/2014 showed stable disease overall. - She is tolerating treatment well. Normal WBC and platelet count today, moderate anemia stable. -Continue letrozole and Palbociclib 125 mg daily, 2 week on, 1 week off. We'll follow up his CBC closely. -restaging PET next month   2. bone metastasis - continue Xgeva monthly  -Continue calcium and vitamin D  3. 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   --  She has been seen by radiation oncology. She has  declined intervention.  She is being followed by Dr. Sondra Come.   5. Leukopenia/Anemia of chronic disease  -- Past work up for reversible causes of anemia were negative.  -Stable moderate anemia, her hemoglobin is 8.4 today. Giving her underlying CHF and COPD on oxygen, I recommend blood transfusion. She would like to discuss with other physicians and will let me know. -Repeated ferritin 255, serum iron 39 and saturation 14%,  -Continue ferrous sulfate 3 times daily -She has normal WBC today, differential showed 1% blasts and 2% metas, few teardrop and schistocytes. This could be related to [place], but the bone marrow disease such as MDS is not ruled out. Giving her metastatic breast cancer, I'll watch this closely, we'll hold bone marrow biopsy for now.  6. CHF --She is not fluid overloaded today on exam. She is on Lasix, nitrogylerin prn, losartan. Increase lasix prn and weight daily. Cards follow up.   7. Diabetes mellitus, type II. Diet controlled.   8. COPD: On albuterol prn. She remains on home oxygen.  9. Chronic renal insufficiency.  -Stable.  10: Bilateral leg wound -improved lately  -Follow up with wound clinic  All questions were answered. The patient knows to call the clinic with any problems, questions or concerns. We can certainly see the patient much sooner if necessary.  Plan: -RTC in 1 month with restaging PET scan -She will let me know if she wants blood transfusion  I spent 25 minutes counseling the patient face to face. The total time spent in the appointment was 30 minutes.   Truitt Merle  01/07/2015

## 2015-01-07 NOTE — Telephone Encounter (Signed)
Gave adn printed appt sched and avs for pt for June  °

## 2015-01-07 NOTE — Telephone Encounter (Signed)
No answer when dialed. Phone rings w/o VM pickup.

## 2015-01-07 NOTE — Telephone Encounter (Signed)
Gave and printed appt sched and avs for pt for JUNE °

## 2015-01-08 LAB — CANCER ANTIGEN 27.29: CA 27.29: 42 U/mL — AB (ref 0–39)

## 2015-01-10 ENCOUNTER — Telehealth: Payer: Self-pay | Admitting: *Deleted

## 2015-01-10 ENCOUNTER — Telehealth: Payer: Self-pay | Admitting: Family Medicine

## 2015-01-10 NOTE — Telephone Encounter (Signed)
Received a referral for a f/u appt w/ Med [facility].  Noticed pt has an appt of 6/14 already scheduled.  Emailed Jayce to see if pt needs to be seen sooner.

## 2015-01-10 NOTE — Telephone Encounter (Signed)
Spoke to patient - all questions addressed. She verbalized understanding.

## 2015-01-10 NOTE — Telephone Encounter (Signed)
Spoke with patient and informed her that Dr. Lacinda Axon wants her to discuss this with her oncologist.  Since she is under their care for this then they are the ones who would schedule the transfusion and are the best to give their opinion on the subject.  Pt voiced understanding and will also ask them about her cataract surgery, if they recommend she have it. Bartow Zylstra,CMA

## 2015-01-10 NOTE — Telephone Encounter (Signed)
She needs to discuss this with her Oncologist.

## 2015-01-10 NOTE — Telephone Encounter (Signed)
Please see previous telephone note. Jazmin Hartsell,CMA

## 2015-01-10 NOTE — Telephone Encounter (Signed)
Anita [name] is calling because she said that she has previously discussed the possibility of a blood transfusion with her PCP. She is calling because she would like to know if he still recommends it and what she needs to do in order to have it done. Thank you, Fonda Kinder, ASA

## 2015-01-14 ENCOUNTER — Telehealth: Payer: Self-pay | Admitting: Hematology

## 2015-01-14 DIAGNOSIS — I83202 Varicose veins of unspecified lower extremity with both ulcer of calf and inflammation: Secondary | ICD-10-CM | POA: Diagnosis not present

## 2015-01-14 NOTE — Telephone Encounter (Signed)
pt called to r/s appt due to Pet scan on same day....pt ok and aware of new d.t

## 2015-01-17 ENCOUNTER — Telehealth: Payer: Self-pay | Admitting: *Deleted

## 2015-01-17 LAB — GLUCOSE, CAPILLARY: Glucose-Capillary: 166 mg/dL — ABNORMAL HIGH (ref 65–99)

## 2015-01-17 NOTE — Telephone Encounter (Signed)
Received email from [facility] stating that the pt does not need to be seen any sooner.  Cancelled referral.

## 2015-01-19 ENCOUNTER — Encounter: Payer: Self-pay | Admitting: Hematology

## 2015-01-19 NOTE — Progress Notes (Signed)
Per Mcarthur Rossetti spec phar they shipped ibrance to the patient 11/30/14 and wanted office to know.

## 2015-01-21 DIAGNOSIS — L97211 Non-pressure chronic ulcer of right calf limited to breakdown of skin: Secondary | ICD-10-CM | POA: Diagnosis not present

## 2015-01-21 DIAGNOSIS — L97221 Non-pressure chronic ulcer of left calf limited to breakdown of skin: Secondary | ICD-10-CM | POA: Diagnosis not present

## 2015-01-21 DIAGNOSIS — I83202 Varicose veins of unspecified lower extremity with both ulcer of calf and inflammation: Secondary | ICD-10-CM | POA: Diagnosis not present

## 2015-01-25 LAB — GLUCOSE, CAPILLARY: GLUCOSE-CAPILLARY: 153 mg/dL — AB (ref 65–99)

## 2015-01-28 ENCOUNTER — Encounter (HOSPITAL_BASED_OUTPATIENT_CLINIC_OR_DEPARTMENT_OTHER): Payer: Medicare HMO | Attending: Internal Medicine

## 2015-01-28 DIAGNOSIS — L97811 Non-pressure chronic ulcer of other part of right lower leg limited to breakdown of skin: Secondary | ICD-10-CM | POA: Insufficient documentation

## 2015-01-28 DIAGNOSIS — I872 Venous insufficiency (chronic) (peripheral): Secondary | ICD-10-CM | POA: Insufficient documentation

## 2015-01-28 DIAGNOSIS — L97821 Non-pressure chronic ulcer of other part of left lower leg limited to breakdown of skin: Secondary | ICD-10-CM | POA: Insufficient documentation

## 2015-02-03 DIAGNOSIS — I872 Venous insufficiency (chronic) (peripheral): Secondary | ICD-10-CM | POA: Diagnosis not present

## 2015-02-03 DIAGNOSIS — L97821 Non-pressure chronic ulcer of other part of left lower leg limited to breakdown of skin: Secondary | ICD-10-CM | POA: Diagnosis not present

## 2015-02-03 DIAGNOSIS — L97811 Non-pressure chronic ulcer of other part of right lower leg limited to breakdown of skin: Secondary | ICD-10-CM | POA: Diagnosis not present

## 2015-02-04 ENCOUNTER — Ambulatory Visit: Payer: Commercial Managed Care - HMO

## 2015-02-07 ENCOUNTER — Other Ambulatory Visit: Payer: Self-pay | Admitting: Family Medicine

## 2015-02-08 ENCOUNTER — Encounter (HOSPITAL_COMMUNITY)
Admission: RE | Admit: 2015-02-08 | Discharge: 2015-02-08 | Disposition: A | Discharge status: Home / Self Care | Payer: Commercial Managed Care - HMO | Source: Ambulatory Visit | Attending: Hematology | Admitting: Hematology

## 2015-02-08 ENCOUNTER — Ambulatory Visit: Payer: Commercial Managed Care - HMO | Admitting: Hematology

## 2015-02-08 ENCOUNTER — Other Ambulatory Visit: Payer: Commercial Managed Care - HMO

## 2015-02-08 ENCOUNTER — Ambulatory Visit: Payer: Commercial Managed Care - HMO

## 2015-02-08 DIAGNOSIS — C7951 Secondary malignant neoplasm of bone: Secondary | ICD-10-CM | POA: Insufficient documentation

## 2015-02-08 DIAGNOSIS — C50919 Malignant neoplasm of unspecified site of unspecified female breast: Secondary | ICD-10-CM | POA: Insufficient documentation

## 2015-02-08 DIAGNOSIS — N200 Calculus of kidney: Secondary | ICD-10-CM | POA: Diagnosis not present

## 2015-02-08 DIAGNOSIS — J439 Emphysema, unspecified: Secondary | ICD-10-CM | POA: Insufficient documentation

## 2015-02-08 DIAGNOSIS — K760 Fatty (change of) liver, not elsewhere classified: Secondary | ICD-10-CM | POA: Insufficient documentation

## 2015-02-08 DIAGNOSIS — R918 Other nonspecific abnormal finding of lung field: Secondary | ICD-10-CM | POA: Insufficient documentation

## 2015-02-08 MED ORDER — FLUDEOXYGLUCOSE F - 18 (FDG) INJECTION
11.6900 | Freq: Once | INTRAVENOUS | Status: AC | PRN
  Administered 2015-02-08: 11.69 via INTRAVENOUS

## 2015-02-09 LAB — GLUCOSE, CAPILLARY: Glucose-Capillary: 157 mg/dL — ABNORMAL HIGH (ref 65–99)

## 2015-02-11 DIAGNOSIS — I872 Venous insufficiency (chronic) (peripheral): Secondary | ICD-10-CM | POA: Diagnosis not present

## 2015-02-16 ENCOUNTER — Ambulatory Visit (HOSPITAL_BASED_OUTPATIENT_CLINIC_OR_DEPARTMENT_OTHER): Payer: Commercial Managed Care - HMO | Admitting: Hematology

## 2015-02-16 ENCOUNTER — Other Ambulatory Visit (HOSPITAL_BASED_OUTPATIENT_CLINIC_OR_DEPARTMENT_OTHER): Payer: Commercial Managed Care - HMO

## 2015-02-16 ENCOUNTER — Ambulatory Visit (HOSPITAL_BASED_OUTPATIENT_CLINIC_OR_DEPARTMENT_OTHER): Payer: Commercial Managed Care - HMO

## 2015-02-16 ENCOUNTER — Encounter: Payer: Self-pay | Admitting: Hematology

## 2015-02-16 ENCOUNTER — Telehealth: Payer: Self-pay | Admitting: Hematology

## 2015-02-16 VITALS — BP 117/51 | HR 105 | Temp 98.3°F | Resp 25 | Ht 62.0 in | Wt 229.0 lb

## 2015-02-16 DIAGNOSIS — D638 Anemia in other chronic diseases classified elsewhere: Secondary | ICD-10-CM

## 2015-02-16 DIAGNOSIS — C50911 Malignant neoplasm of unspecified site of right female breast: Secondary | ICD-10-CM

## 2015-02-16 DIAGNOSIS — C7951 Secondary malignant neoplasm of bone: Secondary | ICD-10-CM

## 2015-02-16 DIAGNOSIS — C7800 Secondary malignant neoplasm of unspecified lung: Secondary | ICD-10-CM

## 2015-02-16 DIAGNOSIS — J449 Chronic obstructive pulmonary disease, unspecified: Secondary | ICD-10-CM

## 2015-02-16 DIAGNOSIS — C50919 Malignant neoplasm of unspecified site of unspecified female breast: Secondary | ICD-10-CM

## 2015-02-16 DIAGNOSIS — N189 Chronic kidney disease, unspecified: Secondary | ICD-10-CM

## 2015-02-16 LAB — CBC & DIFF AND RETIC
BASO%: 1 % (ref 0.0–2.0)
Basophils Absolute: 0 10*3/uL (ref 0.0–0.1)
EOS%: 2.3 % (ref 0.0–7.0)
Eosinophils Absolute: 0.1 10*3/uL (ref 0.0–0.5)
HEMATOCRIT: 27.1 % — AB (ref 34.8–46.6)
HGB: 8.8 g/dL — ABNORMAL LOW (ref 11.6–15.9)
Immature Retic Fract: 21.1 % — ABNORMAL HIGH (ref 1.60–10.00)
LYMPH#: 0.4 10*3/uL — AB (ref 0.9–3.3)
LYMPH%: 14.6 % (ref 14.0–49.7)
MCH: 28.6 pg (ref 25.1–34.0)
MCHC: 32.5 g/dL (ref 31.5–36.0)
MCV: 88 fL (ref 79.5–101.0)
MONO#: 0.2 10*3/uL (ref 0.1–0.9)
MONO%: 8 % (ref 0.0–14.0)
NEUT#: 2.2 10*3/uL (ref 1.5–6.5)
NEUT%: 74.1 % (ref 38.4–76.8)
Platelets: 216 10*3/uL (ref 145–400)
RBC: 3.08 10*6/uL — ABNORMAL LOW (ref 3.70–5.45)
RDW: 18.6 % — ABNORMAL HIGH (ref 11.2–14.5)
RETIC %: 2.41 % — AB (ref 0.70–2.10)
Retic Ct Abs: 74.23 10*3/uL (ref 33.70–90.70)
WBC: 3 10*3/uL — AB (ref 3.9–10.3)

## 2015-02-16 LAB — COMPREHENSIVE METABOLIC PANEL (CC13)
ALK PHOS: 116 U/L (ref 40–150)
ALT: 26 U/L (ref 0–55)
AST: 21 U/L (ref 5–34)
Albumin: 3 g/dL — ABNORMAL LOW (ref 3.5–5.0)
Anion Gap: 10 mEq/L (ref 3–11)
BUN: 33.5 mg/dL — ABNORMAL HIGH (ref 7.0–26.0)
CO2: 24 mEq/L (ref 22–29)
CREATININE: 1.6 mg/dL — AB (ref 0.6–1.1)
Calcium: 10.4 mg/dL (ref 8.4–10.4)
Chloride: 104 mEq/L (ref 98–109)
EGFR: 32 mL/min/{1.73_m2} — ABNORMAL LOW (ref >90)
Glucose: 188 mg/dl — ABNORMAL HIGH (ref 70–140)
Potassium: 4 mEq/L (ref 3.5–5.1)
Sodium: 138 mEq/L (ref 136–145)
Total Bilirubin: 0.33 mg/dL (ref 0.20–1.20)
Total Protein: 6.6 g/dL (ref 6.4–8.3)

## 2015-02-16 MED ORDER — DENOSUMAB 120 MG/1.7ML ~~LOC~~ SOLN
120.0000 mg | Freq: Once | SUBCUTANEOUS | Status: AC
  Administered 2015-02-16: 120 mg via SUBCUTANEOUS
  Filled 2015-02-16: qty 1.7

## 2015-02-16 NOTE — Progress Notes (Signed)
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 She started letrozole in 11/2012.  Added Palbociclib 125 mg daily for 21 days on and 7 days off on 01/01/2014 with disease progression. Dose modified to 2 weeks on 1 week off with cycle 3.   2. zometa added on 01/01/2014 for metastatic bone disease. Zometa held due to renal insufficiency and changed to [facility] because of skeletal disease progression and safe renal profile. 3. Started palliative XRT to the left and right femur (30 Gy in 10 fractions) on 02/02/2014 per Dr. Gery Pray which was finished on 02/24/2014.  INTERVAL HISTORY:  Anita [name] 69 y.o. female with a history of right breast cancer with metastases to the bone/lungs here for follow-up.   Today, she came in a wheelchair for follow up by herself. She is clinically stable. She uses oxygen when she walks around and at night, same as before. She does go out as usual. She follows up with one clinic also, she still has 3-4 small open wound on bilateral lower extremities(per pt). She is lerating letrozole and Ibrance well. She is off Ibrance this week, will restart next cycle on Sunday. She denies any significant pain. Eating well.  No other new complaints.  MEDICAL HISTORY: Past Medical History  Diagnosis Date  . Fibroid   . Morbid obesity   . 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 COPD (chronic obstructive pulmonary disease)   . Chronic diastolic CHF (congestive heart failure)   . NSTEMI (non-ST elevated myocardial infarction)     Cath November 2014 LAD 40% stenosis, first diagonal 80% stenosis, circumflex 20% stenosis, right coronary artery occluded. The EF was 35-40% at that time.  . Anemia     a.  Adm 09/2012 with melena, Hgb 5.8 -> transfused. EGD/colonoscopy unrevealing.  . Breast cancer     a. Mets to bone. ER 100%/ PR 0%/Her2 neu negative.  [name] Ulcers of both lower legs   . Tobacco abuse   . Hyperlipidemia 02/11/2013  . Chronic respiratory failure     a. On O2 qhs. also portable O2.  . Chronic renal insufficiency   . Lesion of vocal cord     a. CT 05/2013 concerning for tumor.  . Shortness of breath   . Depression   . Diabetes mellitus without complication   . Anginal pain   . Dementia     very mild  . Radiation 02/02/14-02/24/14    left and right femur 30 gray    ALLERGIES:  is allergic to omnipaque and benzene.  MEDICATIONS:    Medication List       This list is accurate as of: 02/16/15 12:33 PM.  Always use your most recent med list.               albuterol 108 (90 BASE) MCG/ACT inhaler  Commonly known as:  PROVENTIL HFA;VENTOLIN HFA  Inhale 2 puffs into the lungs every 6 (six) hours as needed for wheezing or shortness of breath.     aspirin 81 MG EC tablet  Take 1 tablet (81 mg total) by mouth daily.     atorvastatin 40 MG tablet  Commonly known as:  LIPITOR  Take 40 mg by  mouth daily.     bag balm Oint ointment  Apply 1 g topically as needed for dry skin or irritation.     clotrimazole 1 % cream  Commonly known as:  LOTRIMIN  Apply 1 application topically daily.     docusate sodium 100 MG capsule  Commonly known as:  COLACE  Take 100 mg by mouth daily.     ferrous sulfate 325 (65 FE) MG tablet  Take 1 tablet (325 mg total) by mouth 3 (three) times daily.     IBRANCE 125 MG capsule  Generic drug:  palbociclib  Take 125 mg by mouth See admin instructions. Take 125mg  every morning for 2 weeks, hold for a week, then repeat. Take whole with food.     isosorbide mononitrate 60 MG 24 hr tablet  Commonly known as:  IMDUR  Take 1 tablet (60 mg total) by mouth daily.     letrozole 2.5 MG tablet  Commonly known as:  FEMARA  Take 1 tablet (2.5 mg total)  by mouth daily.     mirtazapine 15 MG tablet  Commonly known as:  REMERON  Take 1 tablet (15 mg total) by mouth at bedtime.     nitroGLYCERIN 0.4 MG SL tablet  Commonly known as:  NITROSTAT  Place 1 tablet (0.4 mg total) under the tongue every 5 (five) minutes as needed for chest pain.     NON FORMULARY  Place 3 L into the nose as needed (oxygen).     potassium chloride SA 20 MEQ tablet  Commonly known as:  K-DUR,KLOR-CON  Take 2 tablets (40 mEq total) by mouth 2 (two) times daily.     SPIRIVA HANDIHALER 18 MCG inhalation capsule  Generic drug:  tiotropium     torsemide 20 MG tablet  Commonly known as:  DEMADEX  TAKE 2 TABLETS TWICE DAILY     VITAMIN D (CHOLECALCIFEROL) PO  Take 1,000 mg by mouth daily.        SURGICAL HISTORY:  Past Surgical History  Procedure Laterality Date  . Colonoscopy, esophagogastroduodenoscopy (egd) and esophageal dilation N/A 10/13/2012    Procedure: colonoscopy and egd;  Surgeon: Wonda Horner, MD;  Location: [place];  Service: Endoscopy;  Laterality: N/A;  . Left heart catheterization with coronary angiogram N/A 07/07/2013    Procedure: LEFT HEART CATHETERIZATION WITH CORONARY ANGIOGRAM;  Surgeon: Peter M Martinique, MD;  Location: [facility] CATH LAB;  Service: Cardiovascular;  Laterality: N/A;   REVIEW OF SYSTEMS:   Constitutional: Denies fevers, chills or abnormal weight loss Eyes: Denies blurriness of vision Ears, nose, mouth, throat, and face: Denies mucositis or sore throat Respiratory: Denies cough, dyspnea or wheezes Cardiovascular: Denies palpitation, chest discomfort or lower extremity swelling Gastrointestinal:  Denies nausea, heartburn or change in bowel habits Skin: Denies abnormal skin rashes Lymphatics: Denies new lymphadenopathy or easy bruising Neurological:Denies numbness, tingling or new weaknesses Behavioral/Psych: Mood is stable, no new changes  All other systems were reviewed with the patient and are negative.  PHYSICAL  EXAMINATION: ECOG PERFORMANCE STATUS: 2  Blood pressure 117/51, pulse 105, temperature 98.3 F (36.8 C), temperature source Oral, resp. rate 25, height 5\' 2"  (1.575 m), weight 229 lb (103.874 kg), SpO2 94 %.  GENERAL:alert, no distress and comfortable morbidly obese woman in wheelchair, chronically ill appearing SKIN: skin color, texture, turgor are normal; posterior legs with irritation and erythema with a small abrasion on the posterior left thigh with mild TTP.  EYES: normal, Conjunctiva are pink and non-injected, sclera clear  OROPHARYNX:no exudate, no erythema and lips, buccal mucosa, and tongue normal  NECK: supple, thyroid normal size, non-tender, without nodularity LYMPH:  no palpable lymphadenopathy in the cervical, axillary LUNGS: coarse breathing with slightly increased breathing rate HEART: regular rate & rhythm and no murmurs and 2+ lower extremity edema BREAST: deferred ABDOMEN:abdomen soft, non-tender and normal bowel sounds; obese Musculoskeletal:no cyanosis of digits and no clubbing; bilateral lower extremities are wrapped with gauze and a bandage up to mid calf. NEURO: alert & oriented x 3 with fluent speech, no focal motor/sensory deficits  LABORATORY DATA: CBC Latest Ref Rng 02/16/2015 01/07/2015 12/10/2014  WBC 3.9 - 10.3 10e3/uL 3.0(L) 4.3 4.8  Hemoglobin 11.6 - 15.9 g/dL 8.8(L) 8.4(L) 8.5(L)  Hematocrit 34.8 - 46.6 % 27.1(L) 26.2(L) 27.2(L)  Platelets 145 - 400 10e3/uL 216 249 231    CMP Latest Ref Rng 02/16/2015 01/07/2015 12/10/2014  Glucose 70 - 140 mg/dl 188(H) 273(H) 172(H)  BUN 7.0 - 26.0 mg/dL 33.5(H) 23.6 41(H)  Creatinine 0.6 - 1.1 mg/dL 1.6(H) 1.7(H) 1.97(H)  Sodium 136 - 145 mEq/L 138 141 139  Potassium 3.5 - 5.1 mEq/L 4.0 3.5 3.8  Chloride 96 - 112 mmol/L - - 104  CO2 22 - 29 mEq/L $Remove'24 27 26  'tpgRnyl$ Calcium 8.4 - 10.4 mg/dL 10.4 8.4 8.5  Total Protein 6.4 - 8.3 g/dL 6.6 6.2(L) 7.5  Total Bilirubin 0.20 - 1.20 mg/dL 0.33 0.34 0.2(L)  Alkaline Phos 40 - 150 U/L  116 92 104  AST 5 - 34 U/L 21 52(H) 24  ALT 0 - 55 U/L 26 51 24     RADIOGRAPHIC STUDIES:  CT chest abdomen and pelvis 09/01/2014 IMPRESSION: Chest Impression:  1. Stable exam of thorax. 2. Several small smudgy pulmonary nodules are not changed. 3. Stable widespread bony metastasis pneumothorax.  Abdomen / Pelvis Impression:  1. Stable small periaortic retroperitoneal lymph node. 2. No evidence disease progression. 3. Stable widespread skeletal metastasis. 4. Left sided staghorn calculus again demonstrated. 5. Nodular focus in the right kidney at site of prior renal cyst is not changed  PET 02/08/2015 IMPRESSION: 1. Stable 8 mm right level 3 lymph node which is now more metabolically active with SUV max of 8.8 (previously 2.9). No new neck adenopathy. 2. Stable diffuse osseous metastatic disease. 3. Stable pulmonary nodules. No progressive findings. 4. No findings for metastatic disease involving the abdomen/pelvis.   ASSESSMENT: Metastatic breast cancer on palliative letrozole plus Palbociclib and Xgeva for bone mets.   PLAN:  1.  Metastatic breast cancer (ER pos/PR pos/ HER2 neg) with metastasis to bones and lungs.  --She is clinically stable, metastatic bone pain much improved after palliating of radiation. -I reviewed her restaging PET scan from 02/08/2015, which showed overall stable disease, right level III cervical lymph node small hypermetabolic, with stable small size, no other new lesions. - She is tolerating treatment well. Normal WBC and platelet count today, moderate anemia stable. -Continue letrozole and Palbociclib 125 mg daily, 2 week on, 1 week off. We'll follow up his CBC closely.  2. bone metastasis - continue Xgeva monthly  -Continue calcium and vitamin D  3. 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   --She has been seen by radiation oncology. She has declined intervention.  She is being followed by Dr. Sondra Come.   5. Leukopenia/Anemia of  chronic disease  -- Past work up for reversible causes of anemia were negative.  -Stable moderate anemia, her hemoglobin is 8.8 today. We'll hold on transfusion, continue monitoring. -Repeated ferritin 255,  serum iron 39 and saturation 14%,  -She is not taking iron pill, could not tolerate. I'll repeat her iron level next time, if flow will consider IV Feraheme.  6. CHF --She is not fluid overloaded today on exam. She is on Lasix, nitrogylerin prn, losartan. Increase lasix prn and weight daily. Cards follow up.   7. Diabetes mellitus, type II. Diet controlled.   8. COPD: On albuterol prn. She remains on home oxygen.  9. Chronic renal insufficiency.  -Stable.  10: Bilateral leg wound -I asked her to to bring me a picture of her wound when she goes to wound clinic  -Follow up with wound clinic  All questions were answered. The patient knows to call the clinic with any problems, questions or concerns. We can certainly see the patient much sooner if necessary.  Plan: -RTC in 1 month with lab, and xgeva injection  -She will let me know if she wants blood transfusion  I spent 25 minutes counseling the patient face to face. The total time spent in the appointment was 30 minutes.   Truitt Merle  02/16/2015

## 2015-02-16 NOTE — Telephone Encounter (Signed)
per pof to sch pt appt-pt req to move inj to Tuesdays-gave pt copy of avs

## 2015-02-17 LAB — CANCER ANTIGEN 27.29: CA 27.29: 52 U/mL — ABNORMAL HIGH (ref 0–39)

## 2015-02-18 ENCOUNTER — Telehealth: Payer: Self-pay | Admitting: *Deleted

## 2015-02-18 DIAGNOSIS — L97811 Non-pressure chronic ulcer of other part of right lower leg limited to breakdown of skin: Secondary | ICD-10-CM | POA: Diagnosis not present

## 2015-02-18 DIAGNOSIS — L97821 Non-pressure chronic ulcer of other part of left lower leg limited to breakdown of skin: Secondary | ICD-10-CM | POA: Diagnosis not present

## 2015-02-18 DIAGNOSIS — I872 Venous insufficiency (chronic) (peripheral): Secondary | ICD-10-CM | POA: Diagnosis not present

## 2015-02-18 NOTE — Telephone Encounter (Signed)
Received vm call from pt stating "thing in my neck" can it be "done" by radiation.  Returned call to home & mobile # & left message on mobile # to call back.

## 2015-02-22 ENCOUNTER — Telehealth: Payer: Self-pay | Admitting: *Deleted

## 2015-02-22 NOTE — Telephone Encounter (Signed)
VM message from patient received @ 12:45 pm returning call to Dr. Ernestina Penna nurse regarding getting radiation to her neck. She states Dr. Sondra Come is on vacation. Please call patient and advise

## 2015-02-23 ENCOUNTER — Other Ambulatory Visit: Payer: Self-pay

## 2015-02-23 ENCOUNTER — Telehealth: Payer: Self-pay

## 2015-02-23 NOTE — Patient Outreach (Signed)
Unsuccessful attempt made to contact patient after receiving a text from patient stating she needed to talk with me earlier today.  HIPPA compliant message left at contact number.  Plan: Await return call from patient.

## 2015-02-23 NOTE — Telephone Encounter (Signed)
S/w pt about Dr Ernestina Penna response from 6/28 she does not need XRT to her neck. Pt was pleased with her answer.

## 2015-02-23 NOTE — Telephone Encounter (Signed)
I tried both her home and cell and did not reach her, left a message: she does not need radiation to her PET positive cervical node. I encouraged her to call back if she still has additional questions.   Anita [name]  02/23/2015 8:26 AM

## 2015-02-25 ENCOUNTER — Other Ambulatory Visit: Payer: Self-pay

## 2015-02-25 NOTE — Patient Outreach (Signed)
Text received from patient indicating she would like to be followed by [facility] again. Call made to patient or further information and to discuss [facility] referral process, however, patient did not answer call. HIPPA compliant message left for patient with [facility] mail office number

## 2015-03-01 ENCOUNTER — Telehealth: Payer: Self-pay | Admitting: Oncology

## 2015-03-01 NOTE — Telephone Encounter (Signed)
Anita [name] called and requested that Dr. Sondra Come review her PET scan from 02/08/15.  She said that something showed up in her neck area and she wants to make sure it does not need radiation.

## 2015-03-04 ENCOUNTER — Telehealth: Payer: Self-pay | Admitting: Oncology

## 2015-03-04 NOTE — Telephone Encounter (Signed)
Called Anita [name] and let her know that Dr. Sondra Come does not think she needs radiation to her neck lymph node.  Ashleah verbalized agreement and understanding.

## 2015-03-10 ENCOUNTER — Encounter (HOSPITAL_BASED_OUTPATIENT_CLINIC_OR_DEPARTMENT_OTHER): Payer: Commercial Managed Care - HMO | Attending: Internal Medicine

## 2015-03-10 DIAGNOSIS — I83212 Varicose veins of right lower extremity with both ulcer of calf and inflammation: Secondary | ICD-10-CM | POA: Diagnosis present

## 2015-03-10 DIAGNOSIS — I83222 Varicose veins of left lower extremity with both ulcer of calf and inflammation: Secondary | ICD-10-CM | POA: Insufficient documentation

## 2015-03-11 ENCOUNTER — Encounter: Payer: Self-pay | Admitting: Cardiology

## 2015-03-11 ENCOUNTER — Ambulatory Visit (INDEPENDENT_AMBULATORY_CARE_PROVIDER_SITE_OTHER): Payer: Medicare HMO | Admitting: Cardiology

## 2015-03-11 VITALS — BP 126/60 | HR 80 | Ht 62.0 in | Wt 228.0 lb

## 2015-03-11 DIAGNOSIS — I2510 Atherosclerotic heart disease of native coronary artery without angina pectoris: Secondary | ICD-10-CM

## 2015-03-11 DIAGNOSIS — I5032 Chronic diastolic (congestive) heart failure: Secondary | ICD-10-CM

## 2015-03-11 DIAGNOSIS — I2583 Coronary atherosclerosis due to lipid rich plaque: Principal | ICD-10-CM

## 2015-03-11 NOTE — Progress Notes (Signed)
HPI  The patient presents for follow up after a hospitalization for treatment of dyspnea and anasarca.  She actually thinks she is doing well. She has stayed out of the hospital since I last saw her.  She does have chronic oxygen. . She thinks her leg swelling is reduced. She gets wound care and has some sort of adjustable compression stockings. She's not having any overt shortness of breath, PND or orthopnea. She sleeps in a recliner. She's not having any chest pressure, neck or arm discomfort.    Allergies  Allergen Reactions  . Omnipaque [Iohexol]     Pt claims she developed hives after given contrast  . Benzene Rash    Current Outpatient Prescriptions  Medication Sig Dispense Refill  . albuterol (PROVENTIL HFA;VENTOLIN HFA) 108 (90 BASE) MCG/ACT inhaler Inhale 2 puffs into the lungs every 6 (six) hours as needed for wheezing or shortness of breath. 3 Inhaler 3  . aspirin EC 81 MG EC tablet Take 1 tablet (81 mg total) by mouth daily. 30 tablet 3  . atorvastatin (LIPITOR) 40 MG tablet Take 40 mg by mouth daily.    . bag balm OINT ointment Apply 1 g topically as needed for dry skin or irritation. 300 g 11  . clotrimazole (LOTRIMIN) 1 % cream Apply 1 application topically daily. 113 g 3  . docusate sodium (COLACE) 100 MG capsule Take 100 mg by mouth daily.    . isosorbide mononitrate (IMDUR) 60 MG 24 hr tablet Take 1 tablet (60 mg total) by mouth daily. 90 tablet 3  . letrozole (FEMARA) 2.5 MG tablet Take 1 tablet (2.5 mg total) by mouth daily. 90 tablet 2  . mirtazapine (REMERON) 15 MG tablet Take 1 tablet (15 mg total) by mouth at bedtime. 30 tablet 5  . nitroGLYCERIN (NITROSTAT) 0.4 MG SL tablet Place 1 tablet (0.4 mg total) under the tongue every 5 (five) minutes as needed for chest pain. 30 tablet 0  . NON FORMULARY Place 3 L into the nose as needed (oxygen).     . palbociclib (IBRANCE) 125 MG capsule Take 125 mg by mouth See admin instructions. Take 126m every morning for 2 weeks,  hold for a week, then repeat. Take whole with food.    . potassium chloride SA (K-DUR,KLOR-CON) 20 MEQ tablet Take 2 tablets (40 mEq total) by mouth 2 (two) times daily. 120 tablet 5  . SPIRIVA HANDIHALER 18 MCG inhalation capsule     . torsemide (DEMADEX) 20 MG tablet TAKE 2 TABLETS TWICE DAILY (Patient taking differently: TAKE 80  MG IN THE MORNING AND 40 MG AT NIGHT.) 360 tablet 5  . VITAMIN D, CHOLECALCIFEROL, PO Take 1,000 mg by mouth daily.      No current facility-administered medications for this visit.    Past Medical History  Diagnosis Date  . Fibroid   . Morbid obesity   . 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 COPD (chronic obstructive pulmonary disease)   . Chronic diastolic CHF (congestive heart failure)   . NSTEMI (non-ST elevated myocardial infarction)     Cath November 2014 LAD 40% stenosis, first diagonal 80% stenosis, circumflex 20% stenosis, right coronary artery occluded. The EF was 35-40% at that time.  . Anemia     a. Adm 09/2012 with melena, Hgb 5.8 -> transfused. EGD/colonoscopy unrevealing.  . Breast cancer     a. Mets to bone. ER 100%/ PR 0%/Her2 neu negative.  .[name]  Ulcers of both lower legs   . Tobacco abuse   . Hyperlipidemia 02/11/2013  . Chronic respiratory failure     a. On O2 qhs. also portable O2.  . Chronic renal insufficiency   . Lesion of vocal cord     a. CT 05/2013 concerning for tumor.  . Shortness of breath   . Depression   . Diabetes mellitus without complication   . Anginal pain   . Dementia     very mild  . Radiation 02/02/14-02/24/14    left and right femur 30 gray    Past Surgical History  Procedure Laterality Date  . Colonoscopy, esophagogastroduodenoscopy (egd) and esophageal dilation N/A 10/13/2012    Procedure: colonoscopy and egd;  Surgeon: Wonda Horner, MD;  Location: [place];  Service: Endoscopy;  Laterality: N/A;  . Left heart catheterization with coronary angiogram N/A 07/07/2013     Procedure: LEFT HEART CATHETERIZATION WITH CORONARY ANGIOGRAM;  Surgeon: Peter M Martinique, MD;  Location: [facility] CATH LAB;  Service: Cardiovascular;  Laterality: N/A;    ROS:  As stated in the HPI and negative for all other systems.  PHYSICAL EXAM BP 126/60 mmHg  Pulse 80  Ht 5' 2" (1.575 m)  Wt 228 lb (103.42 kg)  BMI 41.69 kg/m2 GEN:  No distress NECK:  No jugular venous distention at 90 degrees, waveform within normal limits, carotid upstroke brisk and symmetric, no bruits, no thyromegaly LUNGS:  Few basilar crackles and BACK:  No CVA tenderness CHEST:  Unremarkable HEART:  S1 and S2 within normal limits, no S3, no S4, no clicks, no rubs, distant heart sounds murmurs ABD:  Positive bowel sounds normal in frequency in pitch, no bruits, no rebound, no guarding, unable to assess midline mass or bruit with the patient seated. EXT:  2 plus pulses throughout, mild bilateral lower extremity edema, no cyanosis no clubbing   ASSESSMENT AND PLAN   CHRONIC DIASTOLIC HF:  She seems to be near euvolemic today. Her labs were checked recently and are to be checked next week at the cancer clinic.  She will continue as above.    CAD:  The patient has no new sypmtoms.  No further cardiovascular testing is indicated.  We will continue with aggressive risk reduction and meds as listed.

## 2015-03-11 NOTE — Patient Instructions (Signed)
Your physician wants you to follow-up in: 6 Months You will receive a reminder letter in the mail two months in advance. If you don't receive a letter, please call our office to schedule the follow-up appointment.  

## 2015-03-16 ENCOUNTER — Other Ambulatory Visit: Payer: Commercial Managed Care - HMO

## 2015-03-16 ENCOUNTER — Other Ambulatory Visit (HOSPITAL_BASED_OUTPATIENT_CLINIC_OR_DEPARTMENT_OTHER): Payer: Commercial Managed Care - HMO

## 2015-03-16 ENCOUNTER — Ambulatory Visit: Payer: Commercial Managed Care - HMO | Admitting: Hematology

## 2015-03-16 ENCOUNTER — Ambulatory Visit: Payer: Commercial Managed Care - HMO

## 2015-03-16 ENCOUNTER — Encounter: Payer: Self-pay | Admitting: Hematology

## 2015-03-16 ENCOUNTER — Ambulatory Visit (HOSPITAL_BASED_OUTPATIENT_CLINIC_OR_DEPARTMENT_OTHER): Payer: Medicaid Other | Admitting: Hematology

## 2015-03-16 ENCOUNTER — Telehealth: Payer: Self-pay | Admitting: Hematology

## 2015-03-16 ENCOUNTER — Ambulatory Visit (HOSPITAL_BASED_OUTPATIENT_CLINIC_OR_DEPARTMENT_OTHER): Payer: Commercial Managed Care - HMO

## 2015-03-16 VITALS — BP 121/59 | HR 89 | Temp 98.7°F | Resp 18 | Ht 62.0 in | Wt 227.2 lb

## 2015-03-16 DIAGNOSIS — Z170 Estrogen receptor positive status [ER+]: Secondary | ICD-10-CM | POA: Diagnosis not present

## 2015-03-16 DIAGNOSIS — N189 Chronic kidney disease, unspecified: Secondary | ICD-10-CM

## 2015-03-16 DIAGNOSIS — C7951 Secondary malignant neoplasm of bone: Principal | ICD-10-CM

## 2015-03-16 DIAGNOSIS — D72819 Decreased white blood cell count, unspecified: Secondary | ICD-10-CM

## 2015-03-16 DIAGNOSIS — C7800 Secondary malignant neoplasm of unspecified lung: Secondary | ICD-10-CM

## 2015-03-16 DIAGNOSIS — C50919 Malignant neoplasm of unspecified site of unspecified female breast: Secondary | ICD-10-CM

## 2015-03-16 DIAGNOSIS — D638 Anemia in other chronic diseases classified elsewhere: Secondary | ICD-10-CM

## 2015-03-16 DIAGNOSIS — C50911 Malignant neoplasm of unspecified site of right female breast: Secondary | ICD-10-CM

## 2015-03-16 LAB — COMPREHENSIVE METABOLIC PANEL (CC13)
ALT: 30 U/L (ref 0–55)
AST: 34 U/L (ref 5–34)
Albumin: 2.9 g/dL — ABNORMAL LOW (ref 3.5–5.0)
Alkaline Phosphatase: 120 U/L (ref 40–150)
Anion Gap: 9 mEq/L (ref 3–11)
BUN: 25.8 mg/dL (ref 7.0–26.0)
CHLORIDE: 104 meq/L (ref 98–109)
CO2: 25 meq/L (ref 22–29)
Calcium: 8.6 mg/dL (ref 8.4–10.4)
Creatinine: 1.7 mg/dL — ABNORMAL HIGH (ref 0.6–1.1)
EGFR: 31 mL/min/{1.73_m2} — ABNORMAL LOW (ref >90)
Glucose: 142 mg/dl — ABNORMAL HIGH (ref 70–140)
POTASSIUM: 4 meq/L (ref 3.5–5.1)
SODIUM: 138 meq/L (ref 136–145)
TOTAL PROTEIN: 6.7 g/dL (ref 6.4–8.3)
Total Bilirubin: 0.3 mg/dL (ref 0.20–1.20)

## 2015-03-16 LAB — CBC & DIFF AND RETIC
BASO%: 1 % (ref 0.0–2.0)
Basophils Absolute: 0 10*3/uL (ref 0.0–0.1)
EOS ABS: 0.1 10*3/uL (ref 0.0–0.5)
EOS%: 3.3 % (ref 0.0–7.0)
HEMATOCRIT: 26.3 % — AB (ref 34.8–46.6)
HGB: 8.3 g/dL — ABNORMAL LOW (ref 11.6–15.9)
IMMATURE RETIC FRACT: 18.9 % — AB (ref 1.60–10.00)
LYMPH#: 0.4 10*3/uL — AB (ref 0.9–3.3)
LYMPH%: 12.4 % — AB (ref 14.0–49.7)
MCH: 28.1 pg (ref 25.1–34.0)
MCHC: 31.6 g/dL (ref 31.5–36.0)
MCV: 89.2 fL (ref 79.5–101.0)
MONO#: 0.4 10*3/uL (ref 0.1–0.9)
MONO%: 13.7 % (ref 0.0–14.0)
NEUT%: 69.6 % (ref 38.4–76.8)
NEUTROS ABS: 2.1 10*3/uL (ref 1.5–6.5)
PLATELETS: 263 10*3/uL (ref 145–400)
RBC: 2.95 10*6/uL — ABNORMAL LOW (ref 3.70–5.45)
RDW: 18.4 % — AB (ref 11.2–14.5)
RETIC %: 2.74 % — AB (ref 0.70–2.10)
RETIC CT ABS: 80.83 10*3/uL (ref 33.70–90.70)
WBC: 3.1 10*3/uL — ABNORMAL LOW (ref 3.9–10.3)

## 2015-03-16 LAB — IRON AND TIBC CHCC
%SAT: 15 % — AB (ref 21–57)
Iron: 38 ug/dL — ABNORMAL LOW (ref 41–142)
TIBC: 251 ug/dL (ref 236–444)
UIBC: 213 ug/dL (ref 120–384)

## 2015-03-16 MED ORDER — DENOSUMAB 120 MG/1.7ML ~~LOC~~ SOLN
120.0000 mg | Freq: Once | SUBCUTANEOUS | Status: AC
  Administered 2015-03-16: 120 mg via SUBCUTANEOUS
  Filled 2015-03-16: qty 1.7

## 2015-03-16 NOTE — Telephone Encounter (Signed)
Gave patient/dtr avs report and appointments for August and September.

## 2015-03-16 NOTE — Progress Notes (Signed)
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 She started letrozole in 11/2012.  Added Palbociclib 125 mg daily for 21 days on and 7 days off on 01/01/2014 with disease progression. Dose modified to 2 weeks on 1 week off with cycle 3.   2. zometa added on 01/01/2014 for metastatic bone disease. Zometa held due to renal insufficiency and changed to [facility] because of skeletal disease progression and safe renal profile. 3. Started palliative XRT to the left and right femur (30 Gy in 10 fractions) on 02/02/2014 per Dr. Gery Pray which was finished on 02/24/2014.  INTERVAL HISTORY:  Anita [name] 69 y.o. female with a history of right breast cancer with metastases to the bone/lungs here for follow-up.   Today, she came in a wheelchair for follow up with her daughter. She is doing very well overall. Ulcers on the lower extremities are healing well, quite small now,  she still follows up with the clinic once a week. She denies any new pain, dyspnea on exertion is stable, she uses oxygen as needed. Her energy level is moderate to low, but able to function well at home. She tolerates letrozole and palbo very well.  MEDICAL HISTORY: Past Medical History  Diagnosis Date  . Fibroid   . Morbid obesity   . 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 COPD (chronic obstructive pulmonary disease)   . Chronic diastolic CHF (congestive heart failure)   . NSTEMI (non-ST elevated myocardial infarction)     Cath November 2014 LAD 40% stenosis, first diagonal 80% stenosis, circumflex 20% stenosis, right coronary artery occluded. The EF was 35-40% at that time.  . Anemia     a. Adm 09/2012 with melena, Hgb 5.8 -> transfused.  EGD/colonoscopy unrevealing.  . Breast cancer     a. Mets to bone. ER 100%/ PR 0%/Her2 neu negative.  [name] Ulcers of both lower legs   . Tobacco abuse   . Hyperlipidemia 02/11/2013  . Chronic respiratory failure     a. On O2 qhs. also portable O2.  . Chronic renal insufficiency   . Lesion of vocal cord     a. CT 05/2013 concerning for tumor.  . Shortness of breath   . Depression   . Diabetes mellitus without complication   . Anginal pain   . Dementia     very mild  . Radiation 02/02/14-02/24/14    left and right femur 30 gray    ALLERGIES:  is allergic to omnipaque and benzene.  MEDICATIONS:    Medication List       This list is accurate as of: 03/16/15  6:54 PM.  Always use your most recent med list.               albuterol 108 (90 BASE) MCG/ACT inhaler  Commonly known as:  PROVENTIL HFA;VENTOLIN HFA  Inhale 2 puffs into the lungs every 6 (six) hours as needed for wheezing or shortness of breath.     aspirin 81 MG EC tablet  Take 1 tablet (81 mg total) by mouth daily.     atorvastatin 40 MG tablet  Commonly known as:  LIPITOR  Take 40 mg by mouth daily.  bag balm Oint ointment  Apply 1 g topically as needed for dry skin or irritation.     clotrimazole 1 % cream  Commonly known as:  LOTRIMIN  Apply 1 application topically daily.     docusate sodium 100 MG capsule  Commonly known as:  COLACE  Take 100 mg by mouth daily.     IBRANCE 125 MG capsule  Generic drug:  palbociclib  Take 125 mg by mouth See admin instructions. Take 125mg  every morning for 2 weeks, hold for a week, then repeat. Take whole with food.     isosorbide mononitrate 60 MG 24 hr tablet  Commonly known as:  IMDUR  Take 1 tablet (60 mg total) by mouth daily.     letrozole 2.5 MG tablet  Commonly known as:  FEMARA  Take 1 tablet (2.5 mg total) by mouth daily.     mirtazapine 15 MG tablet  Commonly known as:  REMERON  Take 1 tablet (15 mg total) by mouth at bedtime.     nitroGLYCERIN 0.4  MG SL tablet  Commonly known as:  NITROSTAT  Place 1 tablet (0.4 mg total) under the tongue every 5 (five) minutes as needed for chest pain.     NON FORMULARY  Place 3 L into the nose as needed (oxygen).     potassium chloride SA 20 MEQ tablet  Commonly known as:  K-DUR,KLOR-CON  Take 2 tablets (40 mEq total) by mouth 2 (two) times daily.     torsemide 20 MG tablet  Commonly known as:  DEMADEX  TAKE 2 TABLETS TWICE DAILY     VITAMIN D (CHOLECALCIFEROL) PO  Take 1,000 mg by mouth daily.        SURGICAL HISTORY:  Past Surgical History  Procedure Laterality Date  . Colonoscopy, esophagogastroduodenoscopy (egd) and esophageal dilation N/A 10/13/2012    Procedure: colonoscopy and egd;  Surgeon: Wonda Horner, MD;  Location: [facility];  Service: Endoscopy;  Laterality: N/A;  . Left heart catheterization with coronary angiogram N/A 07/07/2013    Procedure: LEFT HEART CATHETERIZATION WITH CORONARY ANGIOGRAM;  Surgeon: Peter M Martinique, MD;  Location: [facility] CATH LAB;  Service: Cardiovascular;  Laterality: N/A;   REVIEW OF SYSTEMS:   Constitutional: Denies fevers, chills or abnormal weight loss Eyes: Denies blurriness of vision Ears, nose, mouth, throat, and face: Denies mucositis or sore throat Respiratory: Denies cough, (+) stable dyspnea, no wheezes Cardiovascular: Denies palpitation, chest discomfort or lower extremity swelling Gastrointestinal:  Denies nausea, heartburn or change in bowel habits Skin: Denies abnormal skin rashes Lymphatics: Denies new lymphadenopathy or easy bruising Neurological:Denies numbness, tingling or new weaknesses Behavioral/Psych: Mood is stable, no new changes  All other systems were reviewed with the patient and are negative.  PHYSICAL EXAMINATION: ECOG PERFORMANCE STATUS: 2  Blood pressure 121/59, pulse 89, temperature 98.7 F (37.1 C), temperature source Oral, resp. rate 18, height 5\' 2"  (1.575 m), weight 227 lb 3.2 oz (103.057 kg), SpO2 98  %.  GENERAL:alert, no distress and comfortable morbidly obese woman in wheelchair, chronically ill appearing SKIN: skin color, texture, turgor are normal; posterior legs with irritation and erythema with a small abrasion on the posterior left thigh with mild TTP.  EYES: normal, Conjunctiva are pink and non-injected, sclera clear OROPHARYNX:no exudate, no erythema and lips, buccal mucosa, and tongue normal  NECK: supple, thyroid normal size, non-tender, without nodularity LYMPH:  no palpable lymphadenopathy in the cervical, axillary LUNGS: coarse breathing with slightly increased breathing rate HEART: regular rate &  rhythm and no murmurs and 2+ lower extremity edema BREAST: deferred ABDOMEN:abdomen soft, non-tender and normal bowel sounds; obese Musculoskeletal:no cyanosis of digits and no clubbing; bilateral lower extremities are wrapped with gauze and a bandage up to mid calf. NEURO: alert & oriented x 3 with fluent speech, no focal motor/sensory deficits  LABORATORY DATA: CBC Latest Ref Rng 03/16/2015 02/16/2015 01/07/2015  WBC 3.9 - 10.3 10e3/uL 3.1(L) 3.0(L) 4.3  Hemoglobin 11.6 - 15.9 g/dL 8.3(L) 8.8(L) 8.4(L)  Hematocrit 34.8 - 46.6 % 26.3(L) 27.1(L) 26.2(L)  Platelets 145 - 400 10e3/uL 263 216 249    CMP Latest Ref Rng 03/16/2015 02/16/2015 01/07/2015  Glucose 70 - 140 mg/dl 142(H) 188(H) 273(H)  BUN 7.0 - 26.0 mg/dL 25.8 33.5(H) 23.6  Creatinine 0.6 - 1.1 mg/dL 1.7(H) 1.6(H) 1.7(H)  Sodium 136 - 145 mEq/L 138 138 141  Potassium 3.5 - 5.1 mEq/L 4.0 4.0 3.5  Chloride 96 - 112 mmol/L - - -  CO2 22 - 29 mEq/L $Remove'25 24 27  'PnboXsO$ Calcium 8.4 - 10.4 mg/dL 8.6 10.4 8.4  Total Protein 6.4 - 8.3 g/dL 6.7 6.6 6.2(L)  Total Bilirubin 0.20 - 1.20 mg/dL 0.30 0.33 0.34  Alkaline Phos 40 - 150 U/L 120 116 92  AST 5 - 34 U/L 34 21 52(H)  ALT 0 - 55 U/L 30 26 51     RADIOGRAPHIC STUDIES:  CT chest abdomen and pelvis 09/01/2014 IMPRESSION: Chest Impression:  1. Stable exam of thorax. 2. Several  small smudgy pulmonary nodules are not changed. 3. Stable widespread bony metastasis pneumothorax.  Abdomen / Pelvis Impression:  1. Stable small periaortic retroperitoneal lymph node. 2. No evidence disease progression. 3. Stable widespread skeletal metastasis. 4. Left sided staghorn calculus again demonstrated. 5. Nodular focus in the right kidney at site of prior renal cyst is not changed  PET 02/08/2015 IMPRESSION: 1. Stable 8 mm right level 3 lymph node which is now more metabolically active with SUV max of 8.8 (previously 2.9). No new neck adenopathy. 2. Stable diffuse osseous metastatic disease. 3. Stable pulmonary nodules. No progressive findings. 4. No findings for metastatic disease involving the abdomen/pelvis.   ASSESSMENT: Metastatic breast cancer on palliative letrozole plus Palbociclib and Xgeva for bone mets.   PLAN:  1.  Metastatic breast cancer (ER pos/PR neg/ HER2 neg) with metastasis to bones and lungs.  --She is clinically stable, metastatic bone pain much improved after palliating of radiation. -I reviewed her restaging PET scan from 02/08/2015, which showed overall stable disease, right level III cervical lymph node slightly more hypermetabolic, with stable small size, no other new lesions. - She is tolerating treatment well. Mild neutropenia and moderate anemia likely related to her cancer and palbo -Continue letrozole and Palbociclib 125 mg daily, 2 week on, 1 week off. We'll follow up his CBC closely.  2. bone metastasis - continue Xgeva monthly  -Continue calcium and vitamin D  3. 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   --She has been seen by radiation oncology and followed by Dr. Sondra Come.  -She never had any biopsy, not sure how the diagnosed was made, she was previously told by Dr. Sondra Come that she does not need radiation.   5. Leukopenia/Anemia of chronic disease  -- Past work up for reversible causes of anemia were negative.  -Stable  moderate anemia, her hemoglobin is 8.3 today. We'll hold on transfusion, continue monitoring. -Repeated ferritin 255, serum iron 39 and saturation 14% in 11/2014 -She is not taking iron pill, could not tolerate.  6. CHF --She is not fluid overloaded today on exam. She is on Lasix, nitrogylerin prn, losartan. Increase lasix prn and weight daily. Cards follow up.   7. Diabetes mellitus, type II. Diet controlled.   8. COPD: On albuterol prn. She remains on home oxygen.  9. Chronic renal insufficiency.  -Stable.  10: Bilateral leg wound -I asked her to to bring me a picture of her wound when she goes to wound clinic  -Follow up with wound clinic  All questions were answered. The patient knows to call the clinic with any problems, questions or concerns. We can certainly see the patient much sooner if necessary.  Plan: -RTC in 2 month with lab. Continue monthly xgeva injection    I spent 25 minutes counseling the patient face to face. The total time spent in the appointment was 30 minutes.   Truitt Merle  03/16/2015

## 2015-03-17 ENCOUNTER — Other Ambulatory Visit: Payer: Self-pay | Admitting: *Deleted

## 2015-03-17 ENCOUNTER — Telehealth: Payer: Self-pay | Admitting: *Deleted

## 2015-03-17 LAB — CANCER ANTIGEN 27.29: CA 27.29: 61 U/mL — ABNORMAL HIGH (ref 0–39)

## 2015-03-17 MED ORDER — PALBOCICLIB 125 MG PO CAPS: 125.0000 mg | ORAL_CAPSULE | ORAL | Status: DC

## 2015-03-17 NOTE — Telephone Encounter (Signed)
VM message received from patient @ 1:57 pm. She states she needs refill on her IBrance and also would like to know if Dr. Burr Medico discussed her case with Dr. Sondra Come and what the result of that conversation was.

## 2015-03-17 NOTE — Telephone Encounter (Signed)
I called her back. I have discussed with Dr. Sondra Come, the laryngeal cancer diagnosis is confirmed, she was found to have a suspicious lesion in the vocal cord by her ENT doctor last year, but never biopsied.   We refilled her Ibrance today.  Anita [name]

## 2015-03-17 NOTE — Telephone Encounter (Signed)
Script for [employer] sent to [employer] Order @ [phone].

## 2015-03-18 ENCOUNTER — Telehealth: Payer: Self-pay | Admitting: *Deleted

## 2015-03-18 DIAGNOSIS — I83212 Varicose veins of right lower extremity with both ulcer of calf and inflammation: Secondary | ICD-10-CM | POA: Diagnosis not present

## 2015-03-18 DIAGNOSIS — I83222 Varicose veins of left lower extremity with both ulcer of calf and inflammation: Secondary | ICD-10-CM | POA: Diagnosis not present

## 2015-03-18 NOTE — Telephone Encounter (Signed)
Late Entry:  Note on RX pad stating To whom it may concern, Anita [name] was here today 03/16/15 with her mother for MD visit.

## 2015-03-21 ENCOUNTER — Telehealth: Payer: Self-pay | Admitting: Oncology

## 2015-03-21 LAB — GLUCOSE, CAPILLARY: Glucose-Capillary: 201 mg/dL — ABNORMAL HIGH (ref 65–99)

## 2015-03-21 NOTE — Telephone Encounter (Signed)
Anita [name] left a message asking the date of her next appointment with Dr. Sondra Come.  Called her back and left a message that her next appointment is 04/06/15 at 1:30.  Requested a return call if she has any questions.

## 2015-03-23 ENCOUNTER — Other Ambulatory Visit: Payer: Self-pay | Admitting: *Deleted

## 2015-03-23 DIAGNOSIS — C50911 Malignant neoplasm of unspecified site of right female breast: Secondary | ICD-10-CM

## 2015-03-23 DIAGNOSIS — C7951 Secondary malignant neoplasm of bone: Principal | ICD-10-CM

## 2015-03-23 MED ORDER — PALBOCICLIB 125 MG PO CAPS: 125.0000 mg | ORAL_CAPSULE | ORAL | Status: DC

## 2015-03-25 ENCOUNTER — Ambulatory Visit: Payer: Commercial Managed Care - HMO | Admitting: Radiation Oncology

## 2015-03-25 DIAGNOSIS — I83222 Varicose veins of left lower extremity with both ulcer of calf and inflammation: Secondary | ICD-10-CM | POA: Diagnosis not present

## 2015-03-25 DIAGNOSIS — I83212 Varicose veins of right lower extremity with both ulcer of calf and inflammation: Secondary | ICD-10-CM | POA: Diagnosis not present

## 2015-03-30 NOTE — Progress Notes (Signed)
Head and Neck Cancer Location of Tumor / Histology: laryngeal carcinoma without biopsy.  Patient presented in 2015 with laryngeal cancer but refused radiation at that time.  Pet scan from 02/08/15 showed a "stable 8 mm right level 3 lymph node which is now more metabolically active."  Dr. Ernesto Rutherford recommends palliative radiation to her larynx.  Nutrition Status Yes No Comments  Weight changes? []  [x]    Swallowing concerns? [x]  []    PEG? []  [x]     Referrals Yes No Comments  Social Work? []  [x]    Dentistry? []  [x]    Swallowing therapy? []  [x]    Nutrition? []  [x]    Med/Onc? [x]  []  Next apt with Dr. Burr Medico is on 05/10/15   Safety Issues Yes No Comments  Prior radiation? [x]  []  02/02/14-02/24/14 -left and right femur 30 gray   Pacemaker/ICD? []  [x]    Possible current pregnancy? []  [x]    Is the patient on methotrexate? []  [x]     Tobacco/Marijuana/Snuff/ETOH use:  Former smoker, quit in 2014, denies ETOH, marijuana, snuff use.  Past/Anticipated interventions by otolaryngology, if any: none  Past/Anticipated interventions by medical oncology, if any: receives Xgeva monthly also takes Femara and [place]  Current Complaints / other details:  Patient is here with her daughter.  She reports that she has been vomiting after eating for about 2 weeks.  She denies trouble swallowing or feelings of tightness in her throat.  She does have swollen lymph nodes on the left side of her neck.  She reports she fell on Monday getting in to the [place] bus.  She has 21 stiches in her right shin area.   BP 102/53 mmHg  Pulse 95  Temp(Src) 98.4 F (36.9 C) (Oral)  Resp 20  Ht 5\' 2"  (1.575 m)  Wt 228 lb 11.2 oz (103.738 kg)  BMI 41.82 kg/m2  SpO2 100%   Wt Readings from Last 3 Encounters:  04/06/15 228 lb 11.2 oz (103.738 kg)  04/04/15 230 lb (104.327 kg)  04/01/15 228 lb 1.6 oz (103.465 kg)

## 2015-04-01 ENCOUNTER — Ambulatory Visit (INDEPENDENT_AMBULATORY_CARE_PROVIDER_SITE_OTHER): Payer: Commercial Managed Care - HMO | Admitting: Internal Medicine

## 2015-04-01 ENCOUNTER — Telehealth: Payer: Self-pay | Admitting: Family Medicine

## 2015-04-01 ENCOUNTER — Encounter: Payer: Self-pay | Admitting: Internal Medicine

## 2015-04-01 VITALS — BP 140/67 | HR 97 | Temp 97.5°F | Ht 62.0 in | Wt 228.1 lb

## 2015-04-01 DIAGNOSIS — E119 Type 2 diabetes mellitus without complications: Secondary | ICD-10-CM

## 2015-04-01 DIAGNOSIS — I2583 Coronary atherosclerosis due to lipid rich plaque: Secondary | ICD-10-CM

## 2015-04-01 DIAGNOSIS — I878 Other specified disorders of veins: Secondary | ICD-10-CM

## 2015-04-01 DIAGNOSIS — I2510 Atherosclerotic heart disease of native coronary artery without angina pectoris: Secondary | ICD-10-CM

## 2015-04-01 DIAGNOSIS — J410 Simple chronic bronchitis: Secondary | ICD-10-CM

## 2015-04-01 DIAGNOSIS — E785 Hyperlipidemia, unspecified: Secondary | ICD-10-CM | POA: Diagnosis not present

## 2015-04-01 LAB — POCT GLYCOSYLATED HEMOGLOBIN (HGB A1C): HEMOGLOBIN A1C: 6.6

## 2015-04-01 MED ORDER — POTASSIUM CHLORIDE CRYS ER 20 MEQ PO TBCR: 40.0000 meq | EXTENDED_RELEASE_TABLET | Freq: Two times a day (BID) | ORAL | Status: DC

## 2015-04-01 NOTE — Assessment & Plan Note (Signed)
At 6.6 today (from 6.7 when last checked). Continue management with diet alone. Will monitor.

## 2015-04-01 NOTE — Telephone Encounter (Signed)
Family Medicine After hours phone call  Patient called asking for med refill on KDur. She states she was seen by PCP earlier today and asked for this refill but this was not done. Note from office visit does not mention this refill. I have sent script to last patient 1 week.   I will forward this to PCP to allow her to make the decision for longterm use. Of Note: most recent K+ level was 4.0 (on this dosage of KDur).   Elberta Leatherwood, MD,MS,  PGY2 04/01/2015 6:00 PM

## 2015-04-01 NOTE — Assessment & Plan Note (Signed)
Present with chronic ulceration. Continue use of compression stockings and followed by wound care. Encouraged use of Eucerin ointment, aquaphor, or vaseline for pruritus on intact skin.

## 2015-04-01 NOTE — Patient Instructions (Signed)
It was great seeing you today!   1. You can use Eucerin, Aquaphor and Vaseline for your itchy skin. Just make sure you don't put it directly in any of the open wounds on your legs.    Please bring all your medications to every doctors visit  Sign up for My Chart to have easy access to your labs results, and communication with your Primary care physician.  Next Appointment  Please call to make an appointment with Dr. Juleen China in 6 months    I look forward to talking with you again at our next visit. If you have any questions or concerns before then, please call the clinic at [phone].  Take Care,   Dr. Phill Myron

## 2015-04-01 NOTE — Assessment & Plan Note (Signed)
Stable on 3-4 L of O2. Rarely using albuterol inhaler.

## 2015-04-01 NOTE — Assessment & Plan Note (Signed)
Continuing atorvastatin.  Explained importance of medication d/t patient's CAD and hx of STEMIs.  Last checked over 2 years ago. Will recheck today and access effectiveness of current dose.

## 2015-04-01 NOTE — Progress Notes (Signed)
[facility] Phill Myron, D.O.  Subjective:   Patient here today to meet new physician. Patient is a 69 year old female with complicated medical hx including CAD with STEMIx3, hyperlipidemia, diabetes, chronic LE ulceration, COPD, chronic diastolic heart failure, and known breast cancer with metastatic bone cancer.   1) Diabetes Mellitus Type II  -diet controlled, A1C repeated today  -does not check CBGs at home, they are checked weekly at wound clinic   2) LE edema/ulceration -continued issues with venous stasis and resulting edema/ulceration -followed weekly by wound care -uses compression stockings daily  -complains of pruritis associated with compression stockings   3) COPD -on Oxygen 3-4L  -at baseline, denies increased SOB -last exacerbation earlier this year   4) CAD -stable, denies chest pain -compliant with medications -in need of lipid panel check   All relevant systems were reviewed and were negative unless otherwise noted in the HPI  Past Medical History Reviewed problem list.  Medications- reviewed and updated Current Outpatient Prescriptions  Medication Sig Dispense Refill  . albuterol (PROVENTIL HFA;VENTOLIN HFA) 108 (90 BASE) MCG/ACT inhaler Inhale 2 puffs into the lungs every 6 (six) hours as needed for wheezing or shortness of breath. 3 Inhaler 3  . aspirin EC 81 MG EC tablet Take 1 tablet (81 mg total) by mouth daily. 30 tablet 3  . atorvastatin (LIPITOR) 40 MG tablet Take 40 mg by mouth daily.    . bag balm OINT ointment Apply 1 g topically as needed for dry skin or irritation. 300 g 11  . clotrimazole (LOTRIMIN) 1 % cream Apply 1 application topically daily. 113 g 3  . docusate sodium (COLACE) 100 MG capsule Take 100 mg by mouth daily.    . isosorbide mononitrate (IMDUR) 60 MG 24 hr tablet Take 1 tablet (60 mg total) by mouth daily. 90 tablet 3  . letrozole (FEMARA) 2.5 MG tablet Take 1 tablet (2.5 mg total) by mouth daily.  90 tablet 2  . mirtazapine (REMERON) 15 MG tablet Take 1 tablet (15 mg total) by mouth at bedtime. 30 tablet 5  . nitroGLYCERIN (NITROSTAT) 0.4 MG SL tablet Place 1 tablet (0.4 mg total) under the tongue every 5 (five) minutes as needed for chest pain. 30 tablet 0  . palbociclib (IBRANCE) 125 MG capsule Take 1 capsule (125 mg total) by mouth See admin instructions. Take 125mg  every morning for 2 weeks on,  1 week off . 14 capsule 2  . potassium chloride SA (K-DUR,KLOR-CON) 20 MEQ tablet Take 2 tablets (40 mEq total) by mouth 2 (two) times daily. 120 tablet 5  . torsemide (DEMADEX) 20 MG tablet TAKE 2 TABLETS TWICE DAILY (Patient taking differently: TAKE 80  MG IN THE MORNING AND 40 MG AT NIGHT.) 360 tablet 5  . VITAMIN D, CHOLECALCIFEROL, PO Take 1,000 mg by mouth daily.     . NON FORMULARY Place 3 L into the nose as needed (oxygen).      No current facility-administered medications for this visit.   Chief complaint-noted No additions to family history Social history- patient is a former smoker  Objective: BP 140/67 mmHg  Pulse 97  Temp(Src) 97.5 F (36.4 C) (Oral)  Ht 5\' 2"  (1.575 m)  Wt 228 lb 1.6 oz (103.465 kg)  BMI 41.71 kg/m2 Gen: NAD, alert, cooperative with exam HEENT: NCAT, EOMI Neck: FROM, supple CV: RRR, good S1/S2, no murmur Resp: O2 via nasal cannula, minimal wheezing bilat, non-labored respiratory effort  Abd: SNTND, BS  present, no guarding or organomegaly Ext: 2+ LE edema, LE covered with compression stockings and ace bandages, minimal bleeding through bandages noted Neuro: Alert and oriented, No gross deficits   Assessment/Plan: See problem based a/p Health Maintenance: Patient in need of Tdap vaccine. Declines today. Reminded her to get flu vaccine this fall.

## 2015-04-01 NOTE — Progress Notes (Deleted)
Subjective:    Patient ID: Anita [name], female    DOB: 08/03/1946, 69 y.o.   MRN: [id number]  HPI  CC: ***  # ***:  *** ROS: ***  # ***  *** ROS: ***  Review of Systems   See HPI for ROS. All other systems reviewed and are negative.  Past medical history, surgical, family, and social history reviewed and updated in the EMR as appropriate. Objective:  BP 140/67 mmHg  Pulse 97  Temp(Src) 97.5 F (36.4 C) (Oral)  Ht 5\' 2"  (1.575 m)  Wt 228 lb 1.6 oz (103.465 kg)  BMI 41.71 kg/m2 Vitals and nursing note reviewed   Assessment & Plan:  See Problem List Documentation

## 2015-04-01 NOTE — Assessment & Plan Note (Signed)
Stable. Recently saw cardiology and will continue to follow up with them every 6 months. Continue currents meds.

## 2015-04-02 LAB — LIPID PANEL
CHOL/HDL RATIO: 3.4 ratio (ref <=5.0)
Cholesterol: 121 mg/dL — ABNORMAL LOW (ref 125–200)
HDL: 36 mg/dL — ABNORMAL LOW (ref >=46)
LDL Cholesterol: 54 mg/dL (ref <130)
TRIGLYCERIDES: 157 mg/dL — AB (ref <150)
VLDL: 31 mg/dL — ABNORMAL HIGH (ref <30)

## 2015-04-04 ENCOUNTER — Telehealth: Payer: Self-pay | Admitting: Cardiology

## 2015-04-04 ENCOUNTER — Observation Stay (HOSPITAL_COMMUNITY)
Admission: EM | Admit: 2015-04-04 | Discharge: 2015-04-05 | Disposition: A | Discharge status: Home / Self Care | Payer: Commercial Managed Care - HMO | Attending: Family Medicine | Admitting: Family Medicine

## 2015-04-04 ENCOUNTER — Emergency Department (HOSPITAL_COMMUNITY): Payer: Commercial Managed Care - HMO

## 2015-04-04 ENCOUNTER — Encounter (HOSPITAL_COMMUNITY): Payer: Self-pay | Admitting: Physical Medicine and Rehabilitation

## 2015-04-04 ENCOUNTER — Telehealth: Payer: Self-pay | Admitting: Internal Medicine

## 2015-04-04 ENCOUNTER — Other Ambulatory Visit: Payer: Self-pay

## 2015-04-04 ENCOUNTER — Telehealth: Payer: Self-pay | Admitting: *Deleted

## 2015-04-04 ENCOUNTER — Other Ambulatory Visit: Payer: Self-pay | Admitting: Internal Medicine

## 2015-04-04 DIAGNOSIS — W010XXA Fall on same level from slipping, tripping and stumbling without subsequent striking against object, initial encounter: Secondary | ICD-10-CM | POA: Diagnosis not present

## 2015-04-04 DIAGNOSIS — I83009 Varicose veins of unspecified lower extremity with ulcer of unspecified site: Secondary | ICD-10-CM | POA: Insufficient documentation

## 2015-04-04 DIAGNOSIS — E876 Hypokalemia: Secondary | ICD-10-CM | POA: Diagnosis not present

## 2015-04-04 DIAGNOSIS — I2510 Atherosclerotic heart disease of native coronary artery without angina pectoris: Secondary | ICD-10-CM | POA: Insufficient documentation

## 2015-04-04 DIAGNOSIS — J9610 Chronic respiratory failure, unspecified whether with hypoxia or hypercapnia: Secondary | ICD-10-CM | POA: Insufficient documentation

## 2015-04-04 DIAGNOSIS — J449 Chronic obstructive pulmonary disease, unspecified: Secondary | ICD-10-CM | POA: Insufficient documentation

## 2015-04-04 DIAGNOSIS — E785 Hyperlipidemia, unspecified: Secondary | ICD-10-CM | POA: Insufficient documentation

## 2015-04-04 DIAGNOSIS — D500 Iron deficiency anemia secondary to blood loss (chronic): Secondary | ICD-10-CM | POA: Diagnosis not present

## 2015-04-04 DIAGNOSIS — Z853 Personal history of malignant neoplasm of breast: Secondary | ICD-10-CM | POA: Insufficient documentation

## 2015-04-04 DIAGNOSIS — S81819A Laceration without foreign body, unspecified lower leg, initial encounter: Secondary | ICD-10-CM | POA: Diagnosis present

## 2015-04-04 DIAGNOSIS — S81811A Laceration without foreign body, right lower leg, initial encounter: Principal | ICD-10-CM | POA: Insufficient documentation

## 2015-04-04 DIAGNOSIS — R5381 Other malaise: Secondary | ICD-10-CM | POA: Diagnosis not present

## 2015-04-04 DIAGNOSIS — F0390 Unspecified dementia without behavioral disturbance: Secondary | ICD-10-CM | POA: Insufficient documentation

## 2015-04-04 DIAGNOSIS — I5032 Chronic diastolic (congestive) heart failure: Secondary | ICD-10-CM | POA: Diagnosis not present

## 2015-04-04 DIAGNOSIS — IMO0002 Reserved for concepts with insufficient information to code with codable children: Secondary | ICD-10-CM

## 2015-04-04 DIAGNOSIS — Z87891 Personal history of nicotine dependence: Secondary | ICD-10-CM | POA: Diagnosis not present

## 2015-04-04 DIAGNOSIS — I252 Old myocardial infarction: Secondary | ICD-10-CM | POA: Insufficient documentation

## 2015-04-04 DIAGNOSIS — L97909 Non-pressure chronic ulcer of unspecified part of unspecified lower leg with unspecified severity: Secondary | ICD-10-CM

## 2015-04-04 DIAGNOSIS — E119 Type 2 diabetes mellitus without complications: Secondary | ICD-10-CM | POA: Diagnosis not present

## 2015-04-04 DIAGNOSIS — N183 Chronic kidney disease, stage 3 (moderate): Secondary | ICD-10-CM | POA: Insufficient documentation

## 2015-04-04 DIAGNOSIS — D649 Anemia, unspecified: Secondary | ICD-10-CM

## 2015-04-04 DIAGNOSIS — Z9981 Dependence on supplemental oxygen: Secondary | ICD-10-CM | POA: Insufficient documentation

## 2015-04-04 LAB — CBC
HCT: 22 % — ABNORMAL LOW (ref 36.0–46.0)
Hemoglobin: 7 g/dL — ABNORMAL LOW (ref 12.0–15.0)
MCH: 27.8 pg (ref 26.0–34.0)
MCHC: 31.8 g/dL (ref 30.0–36.0)
MCV: 87.3 fL (ref 78.0–100.0)
PLATELETS: 198 10*3/uL (ref 150–400)
RBC: 2.52 MIL/uL — ABNORMAL LOW (ref 3.87–5.11)
RDW: 18.4 % — AB (ref 11.5–15.5)
WBC: 3 10*3/uL — ABNORMAL LOW (ref 4.0–10.5)

## 2015-04-04 LAB — COMPREHENSIVE METABOLIC PANEL
ALK PHOS: 104 U/L (ref 38–126)
ALT: 39 U/L (ref 14–54)
AST: 41 U/L (ref 15–41)
Albumin: 3.1 g/dL — ABNORMAL LOW (ref 3.5–5.0)
Anion gap: 12 (ref 5–15)
BUN: 26 mg/dL — ABNORMAL HIGH (ref 6–20)
CO2: 24 mmol/L (ref 22–32)
CREATININE: 1.55 mg/dL — AB (ref 0.44–1.00)
Calcium: 8.7 mg/dL — ABNORMAL LOW (ref 8.9–10.3)
Chloride: 103 mmol/L (ref 101–111)
GFR calc non Af Amer: 34 mL/min — ABNORMAL LOW (ref >60)
GFR, EST AFRICAN AMERICAN: 39 mL/min — AB (ref >60)
GLUCOSE: 189 mg/dL — AB (ref 65–99)
Potassium: 3.3 mmol/L — ABNORMAL LOW (ref 3.5–5.1)
Sodium: 139 mmol/L (ref 135–145)
TOTAL PROTEIN: 6.4 g/dL — AB (ref 6.5–8.1)
Total Bilirubin: 0.3 mg/dL (ref 0.3–1.2)

## 2015-04-04 LAB — CBC WITH DIFFERENTIAL/PLATELET
BASOS ABS: 0 10*3/uL (ref 0.0–0.1)
BASOS PCT: 1 % (ref 0–1)
EOS ABS: 0 10*3/uL (ref 0.0–0.7)
EOS PCT: 1 % (ref 0–5)
HCT: 22 % — ABNORMAL LOW (ref 36.0–46.0)
HEMOGLOBIN: 7.1 g/dL — AB (ref 12.0–15.0)
Lymphocytes Relative: 12 % (ref 12–46)
Lymphs Abs: 0.4 10*3/uL — ABNORMAL LOW (ref 0.7–4.0)
MCH: 28 pg (ref 26.0–34.0)
MCHC: 32.3 g/dL (ref 30.0–36.0)
MCV: 86.6 fL (ref 78.0–100.0)
MONO ABS: 0.5 10*3/uL (ref 0.1–1.0)
MONOS PCT: 14 % — AB (ref 3–12)
NEUTROS ABS: 2.7 10*3/uL (ref 1.7–7.7)
Neutrophils Relative %: 72 % (ref 43–77)
Platelets: 250 10*3/uL (ref 150–400)
RBC: 2.54 MIL/uL — ABNORMAL LOW (ref 3.87–5.11)
RDW: 18.4 % — ABNORMAL HIGH (ref 11.5–15.5)
WBC: 3.7 10*3/uL — AB (ref 4.0–10.5)

## 2015-04-04 LAB — PREPARE RBC (CROSSMATCH)

## 2015-04-04 LAB — I-STAT TROPONIN, ED: Troponin i, poc: 0.02 ng/mL (ref 0.00–0.08)

## 2015-04-04 LAB — GLUCOSE, CAPILLARY: Glucose-Capillary: 158 mg/dL — ABNORMAL HIGH (ref 65–99)

## 2015-04-04 LAB — CBG MONITORING, ED: Glucose-Capillary: 143 mg/dL — ABNORMAL HIGH (ref 65–99)

## 2015-04-04 LAB — BRAIN NATRIURETIC PEPTIDE: B NATRIURETIC PEPTIDE 5: 96.1 pg/mL (ref 0.0–100.0)

## 2015-04-04 MED ORDER — LETROZOLE 2.5 MG PO TABS
2.5000 mg | ORAL_TABLET | Freq: Every day | ORAL | Status: DC
  Administered 2015-04-05: 2.5 mg via ORAL
  Filled 2015-04-04: qty 1

## 2015-04-04 MED ORDER — POTASSIUM CHLORIDE CRYS ER 20 MEQ PO TBCR: 40.0000 meq | EXTENDED_RELEASE_TABLET | Freq: Two times a day (BID) | ORAL | Status: DC

## 2015-04-04 MED ORDER — ALBUTEROL SULFATE (2.5 MG/3ML) 0.083% IN NEBU: 3.0000 mL | INHALATION_SOLUTION | Freq: Four times a day (QID) | RESPIRATORY_TRACT | Status: DC | PRN

## 2015-04-04 MED ORDER — INSULIN ASPART 100 UNIT/ML ~~LOC~~ SOLN
0.0000 [IU] | Freq: Three times a day (TID) | SUBCUTANEOUS | Status: DC
  Administered 2015-04-05 (×3): 1 [IU] via SUBCUTANEOUS
  Filled 2015-04-04: qty 1

## 2015-04-04 MED ORDER — TORSEMIDE 20 MG PO TABS
40.0000 mg | ORAL_TABLET | Freq: Two times a day (BID) | ORAL | Status: DC
  Administered 2015-04-04 – 2015-04-05 (×3): 40 mg via ORAL
  Filled 2015-04-04 (×3): qty 2

## 2015-04-04 MED ORDER — ACETAMINOPHEN 325 MG PO TABS: 650.0000 mg | ORAL_TABLET | ORAL | Status: DC | PRN

## 2015-04-04 MED ORDER — ASPIRIN EC 81 MG PO TBEC
81.0000 mg | DELAYED_RELEASE_TABLET | Freq: Every day | ORAL | Status: DC
  Administered 2015-04-04 – 2015-04-05 (×2): 81 mg via ORAL
  Filled 2015-04-04 (×2): qty 1

## 2015-04-04 MED ORDER — LIDOCAINE-EPINEPHRINE (PF) 2 %-1:200000 IJ SOLN
10.0000 mL | Freq: Once | INTRAMUSCULAR | Status: AC
  Administered 2015-04-04: 10 mL via INTRADERMAL
  Filled 2015-04-04: qty 20

## 2015-04-04 MED ORDER — TETANUS-DIPHTH-ACELL PERTUSSIS 5-2.5-18.5 LF-MCG/0.5 IM SUSP
0.5000 mL | Freq: Once | INTRAMUSCULAR | Status: AC
  Administered 2015-04-04: 0.5 mL via INTRAMUSCULAR
  Filled 2015-04-04: qty 0.5

## 2015-04-04 MED ORDER — DOCUSATE SODIUM 100 MG PO CAPS
100.0000 mg | ORAL_CAPSULE | Freq: Every day | ORAL | Status: DC
Start: 2015-04-05 — End: 2015-04-05
  Administered 2015-04-05: 100 mg via ORAL
  Filled 2015-04-04: qty 1

## 2015-04-04 MED ORDER — SODIUM CHLORIDE 0.9 % IV BOLUS (SEPSIS)
1000.0000 mL | Freq: Once | INTRAVENOUS | Status: AC
  Administered 2015-04-04: 1000 mL via INTRAVENOUS

## 2015-04-04 MED ORDER — MIRTAZAPINE 15 MG PO TABS
15.0000 mg | ORAL_TABLET | Freq: Every day | ORAL | Status: DC
  Administered 2015-04-04: 15 mg via ORAL
  Filled 2015-04-04: qty 1

## 2015-04-04 MED ORDER — ONDANSETRON HCL 4 MG/2ML IJ SOLN: 4.0000 mg | Freq: Three times a day (TID) | INTRAMUSCULAR | Status: DC | PRN

## 2015-04-04 MED ORDER — ATORVASTATIN CALCIUM 40 MG PO TABS
40.0000 mg | ORAL_TABLET | Freq: Every day | ORAL | Status: DC
  Filled 2015-04-04: qty 1

## 2015-04-04 MED ORDER — SODIUM CHLORIDE 0.9 % IV SOLN
Freq: Once | INTRAVENOUS | Status: AC
  Administered 2015-04-04: 23:00:00 via INTRAVENOUS

## 2015-04-04 MED ORDER — TRAZODONE HCL 50 MG PO TABS
50.0000 mg | ORAL_TABLET | Freq: Every day | ORAL | Status: DC
  Administered 2015-04-04: 50 mg via ORAL
  Filled 2015-04-04: qty 1

## 2015-04-04 MED ORDER — POTASSIUM CHLORIDE CRYS ER 20 MEQ PO TBCR
40.0000 meq | EXTENDED_RELEASE_TABLET | Freq: Two times a day (BID) | ORAL | Status: DC
  Administered 2015-04-04 – 2015-04-05 (×2): 40 meq via ORAL
  Filled 2015-04-04 (×3): qty 2

## 2015-04-04 MED ORDER — ISOSORBIDE MONONITRATE ER 60 MG PO TB24
60.0000 mg | ORAL_TABLET | Freq: Every day | ORAL | Status: DC
  Administered 2015-04-05: 60 mg via ORAL
  Filled 2015-04-04: qty 1

## 2015-04-04 MED ORDER — PALBOCICLIB 125 MG PO CAPS
125.0000 mg | ORAL_CAPSULE | Freq: Every day | ORAL | Status: DC
  Administered 2015-04-05: 125 mg via ORAL
  Filled 2015-04-04: qty 1

## 2015-04-04 NOTE — Telephone Encounter (Signed)
Pt had called because she is in ER being evaluated for leg laceration she injured on Bus.  She was upset that ER eval thought she was SOB.  I explained they needed to do their evaluation, she was not happy and hung up.

## 2015-04-04 NOTE — Telephone Encounter (Signed)
VM message received from [facility] requesting clarification and verification of IBrance prescription. Return call to [facility] and verified prescription.

## 2015-04-04 NOTE — Progress Notes (Signed)
Pt arrived at 2125. Transferred from stretcher to bed with 3 person MAX assist. VS taken at 2128. Assessment initiated. MD arrived at 2130. Will cont assessment once MD finished. Petra Kuba RN

## 2015-04-04 NOTE — ED Notes (Signed)
Pt states she feels she lost "a gallon of blood.  No one could stop the bleeding and there was huge puddle".

## 2015-04-04 NOTE — H&P (Signed)
[facility] Admission History and Physical Service Pager: [phone]  Patient name: Anita [name] Medical record number: [id number] Date of birth: 1946/08/20 Age: 69 y.o. Gender: female  Primary Care Provider: [name], DO Consultants: None Code Status: Full  Chief Complaint: Laceration of right leg after fall  Assessment and Plan: CRYTAL PENSINGER is a 69 y.o. female presenting with right leg laceration. PMH is significant for breast cancer, COPD, DM2, CHF.   Laceration: Wound repaired in the ED, with clean and dry dressing. Hgb 7.1 on admission, already at transfusion threshold given cardiac disease. Baseline Hbg appears to be ~8-9. Patient now hemodynamically stable after fluid bolus. -Admit to FPTS under Dr. Ree Kida - Repeat CBC now - Transfuse 1 unit pRBC, consider another pending repeat CBC results - AM CBC - monitor wound dressing for signs of continued bleeding - consider examination of wound site at bedside in AM - tylenol for pain  COPD- Stable, takes only albuterol PRN per pt. Was previously on Spiriva but stopped with no change in symptoms. This was reviewed by Pulmonology on 09/08/2014. 3-4 L home O2 requirement with sleep and exertion - Continue supplemental O2 per home regimen - albuterol PRN  Chronic Diastolic HF- BNP on admission 96. Troponin x1 negative, Weight on admission 230, per chart review appears to be baseline weight. Last echo 02/04/2014 EF 55% - continue home torsemide - consider echo if worsening LE edema or increasing O2 requirement  CAD- History of NSTEMI 2014,  followed by cardiology, stable - continue home Imdur - aspirin 81  CKD  III- Cr 1.5 on admission, base line 1.6-1.7, Stable - Trend Cr in AM -avoid nephrotoxic agents  Hypokalemia- K 3.3 on admission - resume home K supplements ( Kdur 40 mEq BID) - follow BMP in AM  LE venous stasis/ chronic wounds - Followed by wound clinic as an outpatient-  stable  DM2- Last A1c 6.6 on 04/01/2015, stable - SSI, CBG ACHS  Breast Cancer- ER +/PR-/Her1 neu -: On chemoradiation and palliative XRT. Palbociclib and Letrozole current therapy - continue letrozole - continue Palbociclib ( brought from home)  FEN/GI: Heart Health/Carb modified diet, KVO  Prophylaxis: will hold anticoagulation overnight in the setting of active bleed. As she has bilateral lower extremity wounds she is unable to use SCDs. Given her high risk for bleeding with medical anticoagulation as well as traumatic risk with sequential compression devices, will hold anticoagulation as the risks outweigh the potential benefits of therapy for one night. If she does require >1 night hospital stay, will consider adding DVT ppx if her bleeding has stopped. Will encourage early ambulation  Disposition: Admit for observation; Pending clinical improvement  History of Present Illness: Anita [name] is a 69 y.o. female presenting after tripping while getting onto a lift to get onto a [employer] bus. She states that her foot got caught on a hooking device on the lift and she fell. She denies trauma to anywhere but her leg. She denies hitting her head. She was conscious for the entire event was in her usual state of health prior to it occuring. Reports that at this time she does not have lightheadedness, dizziness, chest pain or shortness of breath. Denies pain at her right leg. She does have a baseline 3-4L O2 requirement at home when ambulating secondary to COPD.   In the ED her leg was repaired, but was noted to be bleeding considerably likely from a venous laceration. She was noted to be  tachycardic and hypotensive to 83/49 and was given a 1L bolus fluids in the ED, with improvement  Review Of Systems: Per HPI  Otherwise 12 point review of systems was performed and was unremarkable.  Patient Active Problem List   Diagnosis Date Noted  . Leg laceration 04/04/2015  . Venous stasis of lower extremity  12/05/2014  . Intertrigo 02/19/2014  . 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 Hyperlipidemia 02/11/2013  . Breast cancer metastasized to bone 10/14/2012  . Chronic respiratory failure 10/06/2012  . Chronic diastolic heart failure 79/89/2119  . CAD (coronary artery disease) 10/06/2012  . Chronic kidney disease, stage III (moderate) 10/06/2012  . Anemia of chronic disease 10/06/2012  . Breast mass, right 10/06/2012  . History of tobacco abuse 10/06/2012  . COPD (chronic obstructive pulmonary disease) 09/27/2012  . DM2 (diabetes mellitus, type 2) 09/26/2012   Past Medical History: Past Medical History  Diagnosis Date  . Fibroid   . Morbid obesity   . 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 COPD (chronic obstructive pulmonary disease)   . Chronic diastolic CHF (congestive heart failure)   . NSTEMI (non-ST elevated myocardial infarction)     Cath November 2014 LAD 40% stenosis, first diagonal 80% stenosis, circumflex 20% stenosis, right coronary artery occluded. The EF was 35-40% at that time.  . Anemia     a. Adm 09/2012 with melena, Hgb 5.8 -> transfused. EGD/colonoscopy unrevealing.  . Breast cancer     a. Mets to bone. ER 100%/ PR 0%/Her2 neu negative.  [name] Ulcers of both lower legs   . Tobacco abuse   . Hyperlipidemia 02/11/2013  . Chronic respiratory failure     a. On O2 qhs. also portable O2.  . Chronic renal insufficiency   . Lesion of vocal cord     a. CT 05/2013 concerning for tumor.  . Shortness of breath   . Depression   . Diabetes mellitus without complication   . Anginal pain   . Dementia     very mild  . Radiation 02/02/14-02/24/14    left and right femur 30 gray   Past Surgical History: Past Surgical History  Procedure Laterality Date  . Colonoscopy, esophagogastroduodenoscopy (egd) and esophageal dilation N/A 10/13/2012    Procedure: colonoscopy and egd;  Surgeon: Wonda Horner, MD;  Location: [place];  Service: Endoscopy;   Laterality: N/A;  . Left heart catheterization with coronary angiogram N/A 07/07/2013    Procedure: LEFT HEART CATHETERIZATION WITH CORONARY ANGIOGRAM;  Surgeon: Peter M Martinique, MD;  Location: [facility] CATH LAB;  Service: Cardiovascular;  Laterality: N/A;   Social History: History  Substance Use Topics  . Smoking status: Former Smoker -- 2.00 packs/day for 40 years    Types: Cigarettes    Quit date: 09/24/2012  . Smokeless tobacco: Never Used  . Alcohol Use: No   Please also refer to relevant sections of EMR.  Family History: Family History  Problem Relation Age of Onset  . Cancer Mother     leukemia  . Hypertension Mother   . Diabetes Father   . Heart disease Father     passed away due to heart attack   Allergies and Medications: Allergies  Allergen Reactions  . Omnipaque [Iohexol] Hives and Other (See Comments)    Pt claims she developed hives after given contrast  . Benzene Rash   No current facility-administered medications on file prior to encounter.   Current Outpatient  Prescriptions on File Prior to Encounter  Medication Sig Dispense Refill  . albuterol (PROVENTIL HFA;VENTOLIN HFA) 108 (90 BASE) MCG/ACT inhaler Inhale 2 puffs into the lungs every 6 (six) hours as needed for wheezing or shortness of breath. 3 Inhaler 3  . aspirin EC 81 MG EC tablet Take 1 tablet (81 mg total) by mouth daily. 30 tablet 3  . atorvastatin (LIPITOR) 40 MG tablet Take 40 mg by mouth daily.    [name] docusate sodium (COLACE) 100 MG capsule Take 100 mg by mouth daily.    . isosorbide mononitrate (IMDUR) 60 MG 24 hr tablet Take 1 tablet (60 mg total) by mouth daily. 90 tablet 3  . letrozole (FEMARA) 2.5 MG tablet Take 1 tablet (2.5 mg total) by mouth daily. 90 tablet 2  . mirtazapine (REMERON) 15 MG tablet Take 1 tablet (15 mg total) by mouth at bedtime. 30 tablet 5  . nitroGLYCERIN (NITROSTAT) 0.4 MG SL tablet Place 1 tablet (0.4 mg total) under the tongue every 5 (five) minutes as needed for chest  pain. 30 tablet 0  . NON FORMULARY Place 3 L into the nose as needed (oxygen).     . palbociclib (IBRANCE) 125 MG capsule Take 1 capsule (125 mg total) by mouth See admin instructions. Take 125mg  every morning for 2 weeks on,  1 week off . 14 capsule 2  . potassium chloride SA (K-DUR,KLOR-CON) 20 MEQ tablet Take 2 tablets (40 mEq total) by mouth 2 (two) times daily. 120 tablet 6  . torsemide (DEMADEX) 20 MG tablet TAKE 2 TABLETS TWICE DAILY (Patient taking differently: TAKE 80  MG IN THE MORNING AND 40 MG AT NIGHT.) 360 tablet 5  . VITAMIN D, CHOLECALCIFEROL, PO Take 1,000 mg by mouth daily.     . bag balm OINT ointment Apply 1 g topically as needed for dry skin or irritation. 300 g 11  . clotrimazole (LOTRIMIN) 1 % cream Apply 1 application topically daily. 113 g 3    Objective: BP 114/50 mmHg  Pulse 100  Temp(Src) 97.9 F (36.6 C) (Oral)  Resp 18  SpO2 100% Exam: General: NAD, well-nourished, alert, pleasant HEENT: NCAT, EOMI Cardiovascular: mildly tachycardic, regular rhythm, no murmurs auscultated Respiratory: CTAB, normal WOB Abdomen: obese, soft, non tender, + BS Extremities: Right calf dressing clean/dry/intact, left LE wound dressing extending from ankle to mid calf, 1+ b/l LE edema Skin: no rashes noted, multiple excoriation noted over b/i upper and lower extremities Neuro: AO x3, no focal deficits appreciated Psych: pleasant affect  Labs and Imaging: CBC BMET   Recent Labs Lab 04/04/15 1720  WBC 3.7*  HGB 7.1*  HCT 22.0*  PLT 250    Recent Labs Lab 04/04/15 1720  NA 139  K 3.3*  CL 103  CO2 24  BUN 26*  CREATININE 1.55*  GLUCOSE 189*  CALCIUM 8.7*     04/04/2015 CXR: No radiographic evidence of acute cardiopulmonary disease. 2. Atherosclerosis. 3. Lytic lesion in the lateral aspect of the right clavicle, presumably a metastatic lesion  XR Tibula/fibula Negative  Veatrice Bourbon, MD 04/04/2015, 9:48 PM PGY-2, [place] Intern  pager: [phone], text pages welcome  FPTS Upper-Level Resident Addendum  I have independently interviewed and examined the patient. I have discussed the above with the original author and agree with their documentation. My edits for correction/addition/clarification are in pink. Please see also any attending notes.   Katheren Shams, DO PGY-2, [place] Service pager: [phone]  text pages welcome through [facility])

## 2015-04-04 NOTE — Telephone Encounter (Signed)
Would like to know blood work results, also needs Rx for potassium (4times a day) sent to [facility]. Thank you, Fonda Kinder, ASA

## 2015-04-04 NOTE — ED Notes (Signed)
Pt states she feels dizzy.  CBG 145.

## 2015-04-04 NOTE — ED Notes (Signed)
Pt given turkey sandwich

## 2015-04-04 NOTE — ED Notes (Signed)
PA suturing lac

## 2015-04-04 NOTE — ED Provider Notes (Signed)
History  This chart was scribed for non-physician practitioner, Jeannett Senior, PA-C,working with Daleen Bo, MD, by Marlowe Kays, ED Scribe. This patient was seen in room A07C/A07C and the patient's care was started at [place] PM.  Chief Complaint  Patient presents with  . Laceration   The history is provided by the patient and medical records. No language interpreter was used.    HPI Comments:  Anita [name] is a 69 y.o. obese female who presents to the Emergency Department complaining of a laceration to the RLE that occurred PTA. Pt states she was stepping up onto a lift of a [employer] bus and states her shoe got caught, causing her to fall, creating the wound. She reports severe continued bleeding. She has not done anything to treat the wound. She denies modifying factors. She denies numbness, tingling or weakness of the RLE, head injury, LOC, nausea or vomiting. Pt states she sees Dr. Asa Saunas, a wound care specialist, weekly for treatment of her wounds to the BLE. She is unsure of her last tetanus vaccination. Pt denies anticoagulant therapy but does take a daily 81 mg ASA.  Past Medical History  Diagnosis Date  . Fibroid   . Morbid obesity   . 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 COPD (chronic obstructive pulmonary disease)   . Chronic diastolic CHF (congestive heart failure)   . NSTEMI (non-ST elevated myocardial infarction)     Cath November 2014 LAD 40% stenosis, first diagonal 80% stenosis, circumflex 20% stenosis, right coronary artery occluded. The EF was 35-40% at that time.  . Anemia     a. Adm 09/2012 with melena, Hgb 5.8 -> transfused. EGD/colonoscopy unrevealing.  . Breast cancer     a. Mets to bone. ER 100%/ PR 0%/Her2 neu negative.  [name] Ulcers of both lower legs   . Tobacco abuse   . Hyperlipidemia 02/11/2013  . Chronic respiratory failure     a. On O2 qhs. also portable O2.  . Chronic renal insufficiency   . Lesion of  vocal cord     a. CT 05/2013 concerning for tumor.  . Shortness of breath   . Depression   . Diabetes mellitus without complication   . Anginal pain   . Dementia     very mild  . Radiation 02/02/14-02/24/14    left and right femur 30 gray   Past Surgical History  Procedure Laterality Date  . Colonoscopy, esophagogastroduodenoscopy (egd) and esophageal dilation N/A 10/13/2012    Procedure: colonoscopy and egd;  Surgeon: Wonda Horner, MD;  Location: [place];  Service: Endoscopy;  Laterality: N/A;  . Left heart catheterization with coronary angiogram N/A 07/07/2013    Procedure: LEFT HEART CATHETERIZATION WITH CORONARY ANGIOGRAM;  Surgeon: Peter M Martinique, MD;  Location: [facility] CATH LAB;  Service: Cardiovascular;  Laterality: N/A;   Family History  Problem Relation Age of Onset  . Cancer Mother     leukemia  . Hypertension Mother   . Diabetes Father   . Heart disease Father     passed away due to heart attack   History  Substance Use Topics  . Smoking status: Former Smoker -- 2.00 packs/day for 40 years    Types: Cigarettes    Quit date: 09/24/2012  . Smokeless tobacco: Never Used  . Alcohol Use: No   OB History    Gravida Para Term Preterm AB TAB SAB Ectopic Multiple Living   2 2  2       2     Review of Systems  Gastrointestinal: Negative for nausea and vomiting.  Skin: Positive for wound.  Neurological: Negative for syncope, weakness and numbness.  All other systems reviewed and are negative.   Allergies  Omnipaque and Benzene  Home Medications   Prior to Admission medications   Medication Sig Start Date End Date Taking? Authorizing Provider  albuterol (PROVENTIL HFA;VENTOLIN HFA) 108 (90 BASE) MCG/ACT inhaler Inhale 2 puffs into the lungs every 6 (six) hours as needed for wheezing or shortness of breath. 12/08/14   Chesley Mires, MD  aspirin EC 81 MG EC tablet Take 1 tablet (81 mg total) by mouth daily. 10/02/12   Leone Haven, MD  atorvastatin (LIPITOR) 40 MG  tablet Take 40 mg by mouth daily.    Historical Provider, MD  bag balm OINT ointment Apply 1 g topically as needed for dry skin or irritation. 12/03/14   Coral Spikes, DO  clotrimazole (LOTRIMIN) 1 % cream Apply 1 application topically daily. 11/26/14   Coral Spikes, DO  docusate sodium (COLACE) 100 MG capsule Take 100 mg by mouth daily.    Historical Provider, MD  isosorbide mononitrate (IMDUR) 60 MG 24 hr tablet Take 1 tablet (60 mg total) by mouth daily. 11/26/14   Coral Spikes, DO  letrozole (FEMARA) 2.5 MG tablet Take 1 tablet (2.5 mg total) by mouth daily. 12/10/14   Truitt Merle, MD  mirtazapine (REMERON) 15 MG tablet Take 1 tablet (15 mg total) by mouth at bedtime. 12/17/14   Truitt Merle, MD  nitroGLYCERIN (NITROSTAT) 0.4 MG SL tablet Place 1 tablet (0.4 mg total) under the tongue every 5 (five) minutes as needed for chest pain. 03/17/13   Mariel Aloe, MD  NON FORMULARY Place 3 L into the nose as needed (oxygen).     Historical Provider, MD  palbociclib Leslee Home) 125 MG capsule Take 1 capsule (125 mg total) by mouth See admin instructions. Take 125mg  every morning for 2 weeks on,  1 week off . 03/23/15   Truitt Merle, MD  potassium chloride SA (K-DUR,KLOR-CON) 20 MEQ tablet Take 2 tablets (40 mEq total) by mouth 2 (two) times daily. 04/04/15   Nicolette Bang, DO  torsemide (DEMADEX) 20 MG tablet TAKE 2 TABLETS TWICE DAILY Patient taking differently: TAKE 80  MG IN THE MORNING AND 40 MG AT NIGHT. 02/08/15   Coral Spikes, DO  VITAMIN D, CHOLECALCIFEROL, PO Take 1,000 mg by mouth daily.     Historical Provider, MD   There were no vitals taken for this visit. Physical Exam  Constitutional: She is oriented to person, place, and time. She appears well-developed and well-nourished.  HENT:  Head: Normocephalic and atraumatic.  Eyes: Conjunctivae and EOM are normal.  Neck: Normal range of motion.  Cardiovascular: Normal rate, regular rhythm and normal heart sounds.   Pulmonary/Chest: Effort normal. No  respiratory distress. She has wheezes. She has no rales.  Musculoskeletal: Normal range of motion. She exhibits edema.  Bilateral LE pitting edema. DP pulses intact bilaterally  Neurological: She is alert and oriented to person, place, and time.  Skin: Skin is warm and dry.     Psychiatric: She has a normal mood and affect. Her behavior is normal.  Nursing note and vitals reviewed.   ED Course  Procedures (including critical care time) DIAGNOSTIC STUDIES: Oxygen Saturation is 94% on RA, low by my interpretation.   COORDINATION OF CARE: 5:12 PM-  Will apply pressure, clean wound, order X-Ray and lab work and suture wound.Pt verbalizes understanding and agrees to plan.  LACERATION REPAIR PROCEDURE NOTE The patient's identification was confirmed and consent was obtained. This procedure was performed by Jeannett Senior at 5:52 PM. Site: anterior right lower extremity Sterile procedures observed Anesthetic used (type and amt): Lidocaine 2% with Epinephrine (8 mLs) Suture type/size: 3-0 Prolene; 4-0 Vicryl Rapide Length: 15 cm # of Sutures: 21; 4 Technique: simple, interrupted; subcutaneous Complexity: complex Antibx ointment applied Tetanus ordered Site anesthetized, irrigated with NS, explored without evidence of foreign body, wound well approximated, site covered with dry, sterile dressing.  Patient tolerated procedure well without complications. Instructions for care discussed verbally and patient provided with additional written instructions for homecare and f/u.  Medications  lidocaine-EPINEPHrine (XYLOCAINE W/EPI) 2 %-1:200000 (PF) injection 10 mL (not administered)    Labs Review Labs Reviewed  CBC WITH DIFFERENTIAL/PLATELET - Abnormal; Notable for the following:    WBC 3.7 (*)    RBC 2.54 (*)    Hemoglobin 7.1 (*)    HCT 22.0 (*)    RDW 18.4 (*)    Lymphs Abs 0.4 (*)    Monocytes Relative 14 (*)    All other components within normal limits  COMPREHENSIVE  METABOLIC PANEL - Abnormal; Notable for the following:    Potassium 3.3 (*)    Glucose, Bld 189 (*)    BUN 26 (*)    Creatinine, Ser 1.55 (*)    Calcium 8.7 (*)    Total Protein 6.4 (*)    Albumin 3.1 (*)    GFR calc non Af Amer 34 (*)    GFR calc Af Amer 39 (*)    All other components within normal limits  CBC - Abnormal; Notable for the following:    WBC 3.0 (*)    RBC 2.52 (*)    Hemoglobin 7.0 (*)    HCT 22.0 (*)    RDW 18.4 (*)    All other components within normal limits  GLUCOSE, CAPILLARY - Abnormal; Notable for the following:    Glucose-Capillary 158 (*)    All other components within normal limits  CBG MONITORING, ED - Abnormal; Notable for the following:    Glucose-Capillary 143 (*)    All other components within normal limits  BRAIN NATRIURETIC PEPTIDE  BASIC METABOLIC PANEL  CBC  I-STAT TROPOININ, ED  TYPE AND SCREEN  PREPARE RBC (CROSSMATCH)    Imaging Review Dg Chest 2 View  04/04/2015   CLINICAL DATA:  69 year old female with history of trauma from fall today with laceration to the right leg.  EXAM: CHEST  2 VIEW  COMPARISON:  Chest x-ray 10/15/2014.  FINDINGS: Lung volumes are normal. No consolidative airspace disease. No pleural effusions. No pneumothorax. No pulmonary nodule or mass noted. Pulmonary vasculature and the cardiomediastinal silhouette are within normal limits. The heart size and mediastinal contours are within normal limits. Atherosclerosis in the thoracic aorta. Old healed fracture of the posterior aspect of the left sixth rib, similar to the prior examination. Well-defined lucent lesion in the lateral aspect of the right clavicle, similar to the prior examination, but new compared to more remote prior study from 07/06/2013, presumably a metastatic lesion.  IMPRESSION: 1. No radiographic evidence of acute cardiopulmonary disease. 2. Atherosclerosis. 3. Lytic lesion in the lateral aspect of the right clavicle, presumably a metastatic lesion.    Electronically Signed   By: Vinnie Langton M.D.   On: 04/04/2015 18:17   Dg  Tibia/fibula Right  04/04/2015   CLINICAL DATA:  69 year old female with history of trauma from fall today with laceration to the right leg.  EXAM: RIGHT TIBIA AND FIBULA - 2 VIEW  COMPARISON:  No priors.  FINDINGS: There is no evidence of fracture or other focal bone lesions. Soft tissues are unremarkable.  IMPRESSION: Negative.   Electronically Signed   By: Vinnie Langton M.D.   On: 04/04/2015 18:08     EKG Interpretation None      MDM   Final diagnoses:  Laceration  Anemia, unspecified anemia type    5:46 PM Patient emergency department with large laceration to the right lower leg after mechanical fall. Patient is found to be tachycardic and short of breath during examination. Patient states she is chronically short of breath and she does not feel like breathing is worse than normal. Her heart rate is in 120s. Patient's dressing is saturated with blood, I am unsure of how much blood she lost prior to coming in. The wound is actively bleeding, appears to be venous. Pressure dressing applied. Will get x-rays and labs.   Patient's laceration continues to bleed. Her blood pressure dropped to 83/49. She continues to be tachycardic. I'm concerned about active bleeding. Patient's wound was repaired at this time. Bleeding has stopped. Patient did state that there was "a gallon of blood on the road." She states that "nobody could stop the bleeding." I will admit patient for close monitoring. Spoke with family practice they will admit. Patient continues to be slightly tachycardic again denies any chest pain or shortness of breath. Patient was given a meal because she was hungry, ate without difficulties.   Filed Vitals:   04/04/15 2100 04/04/15 2128 04/05/15 0019 04/05/15 0053  BP: 108/62 114/50 116/49 110/46  Pulse: 110 100 83 77  Temp:  97.9 F (36.6 C) 97.9 F (36.6 C) 97.8 F (36.6 C)  TempSrc:  Oral Oral Oral   Resp: $Remo'22 18 18 20  'CrYHz$ Height:  $Remove'5\' 2"'aYdEeoo$  (1.575 m)    Weight:  230 lb (104.327 kg)    SpO2: 100% 100% 100% 100%       I personally performed the services described in this documentation, which was scribed in my presence. The recorded information has been reviewed and is accurate.    Jeannett Senior, PA-C 04/05/15 0133  Daleen Bo, MD 04/06/15 1308

## 2015-04-04 NOTE — ED Notes (Addendum)
Pt presents to department for evaluation of R lower leg laceration. Pt states she was attempting to get on bus when R lower leg struck metal hook. Large laceration noted upon arrival, bleeding at present. Sensation and movement intact to R foot, pedal pulses present. Pt denies pain at the time. She is alert and oriented x4.

## 2015-04-04 NOTE — Plan of Care (Signed)
Problem: Problem: Skin/Wound Progression Goal: APPROPRIATE NUTRITIONAL STATUS Outcome: Completed/Met Date Met:  04/04/15 Pt tolerating diet very well

## 2015-04-05 DIAGNOSIS — IMO0002 Reserved for concepts with insufficient information to code with codable children: Secondary | ICD-10-CM | POA: Insufficient documentation

## 2015-04-05 DIAGNOSIS — I5032 Chronic diastolic (congestive) heart failure: Secondary | ICD-10-CM

## 2015-04-05 DIAGNOSIS — I83024 Varicose veins of left lower extremity with ulcer of heel and midfoot: Secondary | ICD-10-CM

## 2015-04-05 DIAGNOSIS — S81811A Laceration without foreign body, right lower leg, initial encounter: Secondary | ICD-10-CM | POA: Diagnosis not present

## 2015-04-05 DIAGNOSIS — I83029 Varicose veins of left lower extremity with ulcer of unspecified site: Secondary | ICD-10-CM

## 2015-04-05 DIAGNOSIS — I83028 Varicose veins of left lower extremity with ulcer other part of lower leg: Secondary | ICD-10-CM

## 2015-04-05 DIAGNOSIS — I83009 Varicose veins of unspecified lower extremity with ulcer of unspecified site: Secondary | ICD-10-CM | POA: Insufficient documentation

## 2015-04-05 DIAGNOSIS — D649 Anemia, unspecified: Secondary | ICD-10-CM | POA: Insufficient documentation

## 2015-04-05 DIAGNOSIS — I83021 Varicose veins of left lower extremity with ulcer of thigh: Secondary | ICD-10-CM

## 2015-04-05 DIAGNOSIS — T148 Other injury of unspecified body region: Secondary | ICD-10-CM

## 2015-04-05 DIAGNOSIS — I83022 Varicose veins of left lower extremity with ulcer of calf: Secondary | ICD-10-CM

## 2015-04-05 DIAGNOSIS — L97909 Non-pressure chronic ulcer of unspecified part of unspecified lower leg with unspecified severity: Secondary | ICD-10-CM | POA: Insufficient documentation

## 2015-04-05 DIAGNOSIS — J42 Unspecified chronic bronchitis: Secondary | ICD-10-CM

## 2015-04-05 DIAGNOSIS — I2510 Atherosclerotic heart disease of native coronary artery without angina pectoris: Secondary | ICD-10-CM | POA: Diagnosis not present

## 2015-04-05 DIAGNOSIS — I83025 Varicose veins of left lower extremity with ulcer other part of foot: Secondary | ICD-10-CM

## 2015-04-05 DIAGNOSIS — I83023 Varicose veins of left lower extremity with ulcer of ankle: Secondary | ICD-10-CM

## 2015-04-05 DIAGNOSIS — J449 Chronic obstructive pulmonary disease, unspecified: Secondary | ICD-10-CM | POA: Insufficient documentation

## 2015-04-05 LAB — BASIC METABOLIC PANEL
Anion gap: 6 (ref 5–15)
BUN: 25 mg/dL — ABNORMAL HIGH (ref 6–20)
CO2: 28 mmol/L (ref 22–32)
CREATININE: 1.58 mg/dL — AB (ref 0.44–1.00)
Calcium: 8 mg/dL — ABNORMAL LOW (ref 8.9–10.3)
Chloride: 105 mmol/L (ref 101–111)
GFR calc Af Amer: 38 mL/min — ABNORMAL LOW (ref >60)
GFR calc non Af Amer: 33 mL/min — ABNORMAL LOW (ref >60)
Glucose, Bld: 153 mg/dL — ABNORMAL HIGH (ref 65–99)
POTASSIUM: 3.6 mmol/L (ref 3.5–5.1)
SODIUM: 139 mmol/L (ref 135–145)

## 2015-04-05 LAB — CBC
HCT: 27 % — ABNORMAL LOW (ref 36.0–46.0)
HEMOGLOBIN: 8.6 g/dL — AB (ref 12.0–15.0)
MCH: 27.8 pg (ref 26.0–34.0)
MCHC: 31.9 g/dL (ref 30.0–36.0)
MCV: 87.4 fL (ref 78.0–100.0)
Platelets: 204 10*3/uL (ref 150–400)
RBC: 3.09 MIL/uL — ABNORMAL LOW (ref 3.87–5.11)
RDW: 18 % — ABNORMAL HIGH (ref 11.5–15.5)
WBC: 3.5 10*3/uL — AB (ref 4.0–10.5)

## 2015-04-05 LAB — GLUCOSE, CAPILLARY
GLUCOSE-CAPILLARY: 146 mg/dL — AB (ref 65–99)
Glucose-Capillary: 134 mg/dL — ABNORMAL HIGH (ref 65–99)
Glucose-Capillary: 145 mg/dL — ABNORMAL HIGH (ref 65–99)

## 2015-04-05 NOTE — Discharge Summary (Signed)
[facility] Discharge Summary  Patient name: Anita [name] Medical record number: [id number] Date of birth: 19-Jul-1946 Age: 69 y.o. Gender: female Date of Admission: 04/04/2015  Date of Discharge: 04/05/2015  Admitting Physician: Lupita Dawn, MD  Primary Care Provider: [name], DO Consultants: None  Indication for Hospitalization:  Right lower extremity laceration  Discharge Diagnoses/Problem List:  Patient Active Problem List   Diagnosis Date Noted  . Laceration   . Absolute anemia   . CAD in native artery   . Venous stasis ulcer   . COLD (chronic obstructive lung disease)   . Leg laceration 04/04/2015  . Laceration of lower extremity 04/04/2015  . Venous stasis of lower extremity 12/05/2014  . Intertrigo 02/19/2014  . 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 Hyperlipidemia 02/11/2013  . Breast cancer metastasized to bone 10/14/2012  . Chronic respiratory failure 10/06/2012  . Chronic diastolic heart failure 23/30/0762  . CAD (coronary artery disease) 10/06/2012  . Chronic kidney disease, stage III (moderate) 10/06/2012  . Anemia of chronic disease 10/06/2012  . Breast mass, right 10/06/2012  . History of tobacco abuse 10/06/2012  . COPD (chronic obstructive pulmonary disease) 09/27/2012  . DM2 (diabetes mellitus, type 2) 09/26/2012     Disposition: Home  Discharge Condition: Stable  Discharge Exam:   Temp: [97.5 F (36.4 C)-98 F (36.7 C)] 97.5 F (36.4 C) (08/09 0558) Pulse Rate: [77-122] 83 (08/09 0558) Resp: [16-24] 18 (08/09 0558) BP: (83-123)/(19-69) 116/51 mmHg (08/09 0558) SpO2: [94 %-100 %] 100 % (08/09 0558) Weight: [230 lb (104.327 kg)] 230 lb (104.327 kg) (08/08 2128) Physical Exam: General: NAD, well-nourished, alert, pleasant HEENT: NCAT, EOMI Cardiovascular: mildly tachycardic, regular rhythm, no murmurs auscultated Respiratory: CTAB, normal WOB Abdomen: obese, soft, non tender, + BS Extremities:  Right calf dressing clean/dry/intact, left LE wound dressing extending from ankle to mid calf, 1+ b/l LE edema Skin: no rashes noted, multiple excoriations noted over b/i upper and lower extremities Neuro: AO x3, no focal deficits appreciated Psych: pleasant affect  Brief Hospital Course:  Anita [name] is a 69 y.o. female presenting with right leg laceration. PMH is significant for breast cancer, COPD, DM2, CHF.   Wound was repaired in the emergency department but given concern for acute blood loss and hemodynamic instability she was admitted for observation. Her Hgb was 7.1 on admission and she was transfused 1 unit pRBCs with a post transfusion Hgb of 8.6. Her bleeding resolved and the pressure dressing was removed prior to discharge. The stitches ( 21 in total) will be removed in one week after placement,. She sees wound therapy regularly for chronic lower extremity wounds. A letter explaining care for this wound was given to the patient for them to remove the stitches. If wound care unable to remove stitches, will remove stitches at follow up appointment with family medicine on 8/17. The remained her her chronic conditions remained stable throughout her admission. Physical therapy evaluated her prior to discharge and recommended home health physical therapy which she was discharged with   Issues for Follow Up:  1. Right leg wound healing 2. Home health Phyiscal therapy  Significant Procedures:  Right leg wound repair  Significant Labs and Imaging:   Recent Labs Lab 04/04/15 1720 04/04/15 2241 04/05/15 0746  WBC 3.7* 3.0* 3.5*  HGB 7.1* 7.0* 8.6*  HCT 22.0* 22.0* 27.0*  PLT 250 198 204    Recent Labs Lab 04/04/15 1720 04/05/15 0746  NA 139 139  K  3.3* 3.6  CL 103 105  CO2 24 28  GLUCOSE 189* 153*  BUN 26* 25*  CREATININE 1.55* 1.58*  CALCIUM 8.7* 8.0*  ALKPHOS 104  --   AST 41  --   ALT 39  --   ALBUMIN 3.1*  --       Results/Tests Pending at Time of  Discharge:  None  Discharge Medications:    Medication List    ASK your doctor about these medications        albuterol 108 (90 BASE) MCG/ACT inhaler  Commonly known as:  PROVENTIL HFA;VENTOLIN HFA  Inhale 2 puffs into the lungs every 6 (six) hours as needed for wheezing or shortness of breath.     aspirin 81 MG EC tablet  Take 1 tablet (81 mg total) by mouth daily.     atorvastatin 40 MG tablet  Commonly known as:  LIPITOR  Take 40 mg by mouth daily.     bag balm Oint ointment  Apply 1 g topically as needed for dry skin or irritation.     clotrimazole 1 % cream  Commonly known as:  LOTRIMIN  Apply 1 application topically daily.     docusate sodium 100 MG capsule  Commonly known as:  COLACE  Take 100 mg by mouth daily.     isosorbide mononitrate 60 MG 24 hr tablet  Commonly known as:  IMDUR  Take 1 tablet (60 mg total) by mouth daily.     letrozole 2.5 MG tablet  Commonly known as:  FEMARA  Take 1 tablet (2.5 mg total) by mouth daily.     mirtazapine 15 MG tablet  Commonly known as:  REMERON  Take 1 tablet (15 mg total) by mouth at bedtime.     nitroGLYCERIN 0.4 MG SL tablet  Commonly known as:  NITROSTAT  Place 1 tablet (0.4 mg total) under the tongue every 5 (five) minutes as needed for chest pain.     NON FORMULARY  Place 3 L into the nose as needed (oxygen).     palbociclib 125 MG capsule  Commonly known as:  IBRANCE  Take 1 capsule (125 mg total) by mouth See admin instructions. Take 125mg  every morning for 2 weeks on,  1 week off .     potassium chloride SA 20 MEQ tablet  Commonly known as:  K-DUR,KLOR-CON  Take 2 tablets (40 mEq total) by mouth 2 (two) times daily.     torsemide 20 MG tablet  Commonly known as:  DEMADEX  TAKE 2 TABLETS TWICE DAILY     VITAMIN D (CHOLECALCIFEROL) PO  Take 1,000 mg by mouth daily.        Discharge Instructions: Please refer to Patient Instructions section of EMR for full details.  Patient was counseled  important signs and symptoms that should prompt return to medical care, changes in medications, dietary instructions, activity restrictions, and follow up appointments.   Follow-Up Appointments:     Follow-up Information    Follow up with [name], DO On 04/13/2015.   Specialty:  Family Medicine   Why:  3:30 pm   Contact information:   [place] [address] [phone]       Veatrice Bourbon, MD 04/05/2015, 11:44 AM PGY-2, [place]

## 2015-04-05 NOTE — Discharge Instructions (Signed)
°  Please follow up with Family Medicine   Follow-up Information    Follow up with [name], DO On 04/13/2015.   Specialty:  Family Medicine   Why:  3:30 pm   Contact information:   [place] [address] [phone]

## 2015-04-05 NOTE — Progress Notes (Signed)
Family Medicine Teaching Service Daily Progress Note Intern Pager: [phone]  Patient name: Anita [name] Medical record number: [id number] Date of birth: [birth year] Age: 69 y.o. Gender: female  Primary Care Provider: [name], DO Consultants: None Code Status: Full  Pt Overview and Major Events to Date:  8/8: Admitted for acute blood loss after RLE laceration  Assessment and Plan:  Anita [name] is a 69 y.o. female presenting with right leg laceration. PMH is significant for breast cancer, COPD, DM2, CHF.   Laceration: Wound repaired with 21 stitches, no evidence of further bleeding. Hgb 8.6 post transfusion - monitor wound dressing for signs of continued bleeding - consider examination of wound site at bedside in AM - tylenol for pain - PT/OT today  COPD- Stable, on home O2 - 3-4 L home O2 requirement with sleep and exertion - Continue supplemental O2 per home regimen - albuterol PRN  Chronic Diastolic HF- Stable - continue home torsemide - consider echo if worsening LE edema or increasing O2 requirement  CAD- History of NSTEMI 2014, followed by cardiology, stable - continue home Imdur - aspirin 81  CKD III- Cr 1.5 on admission, base line 1.6-1.7, Stable - Trend Cr in AM - avoid nephrotixic  Hypokalemia-resolved - resume home K supplements ( Kdur 40 mEq BID) - follow BMP in AM  LE venous stasis/ chronic wounds - Followed by wound clinic as an outpatient- stable  DM2- Last A1c 6.6 on 04/01/2015, stable - SSI, CBG ACHS  Breast Cancer- ER +/PR-/Her1 neu -: On chemoradiation and palliative XRT. Palbociclib and Letrozole current therapy - continue letrozole - continue Palbociclib ( brought from home)  FEN/GI: Heart Health/Carb modified diet, KVO  Prophylaxis: will hold anticoagulation overnight in the setting of active bleed. As she has bilateral lower extremity wounds she is unable to use SCDs. Given her high risk for bleeding with medical anticoagulation  as well as traumatic risk with sequential compression devices, will hold anticoagulation as the risks outweigh the potential benefits of therapy for one night. If she does require >1 night hospital stay, will consider adding DVT ppx if her bleeding has stopped. Will encourage early ambulation  Disposition: Pending PT eval, will need stitches removes in one week  Subjective:  Reports some LE pain but has not asked for PRNS, denies chest pain, SOB  Objective: Temp:  [97.5 F (36.4 C)-98 F (36.7 C)] 97.5 F (36.4 C) (08/09 0558) Pulse Rate:  [77-122] 83 (08/09 0558) Resp:  [16-24] 18 (08/09 0558) BP: (83-123)/(19-69) 116/51 mmHg (08/09 0558) SpO2:  [94 %-100 %] 100 % (08/09 0558) Weight:  [230 lb (104.327 kg)] 230 lb (104.327 kg) (08/08 2128) Physical Exam: General: NAD, well-nourished, alert, pleasant HEENT: NCAT, EOMI Cardiovascular: mildly tachycardic, regular rhythm, no murmurs auscultated Respiratory: CTAB, normal WOB Abdomen: obese, soft, non tender, + BS Extremities: Right calf dressing clean/dry/intact, left LE wound dressing extending from ankle to mid calf, 1+ b/l LE edema Skin: no rashes noted, multiple excoriations noted over b/i upper and lower extremities Neuro: AO x3, no focal deficits appreciated Psych: pleasant affect  Laboratory:  Recent Labs Lab 04/04/15 1720 04/04/15 2241 04/05/15 0746  WBC 3.7* 3.0* 3.5*  HGB 7.1* 7.0* 8.6*  HCT 22.0* 22.0* 27.0*  PLT 250 198 204    Recent Labs Lab 04/04/15 1720 04/05/15 0746  NA 139 139  K 3.3* 3.6  CL 103 105  CO2 24 28  BUN 26* 25*  CREATININE 1.55* 1.58*  CALCIUM 8.7* 8.0*  PROT  6.4*  --   BILITOT 0.3  --   ALKPHOS 104  --   ALT 39  --   AST 41  --   GLUCOSE 189* 153*     Imaging/Diagnostic Tests:  CXR: No radiographic evidence of acute cardiopulmonary disease. 2. Atherosclerosis. 3. Lytic lesion in the lateral aspect of the right clavicle, presumably a metastatic lesion  XR  Tibula/fibula Negative  Veatrice Bourbon, MD 04/05/2015, 9:31 AM PGY-2, [place] Intern pager: [phone], text pages welcome

## 2015-04-05 NOTE — Care Management Note (Signed)
Case Management Note  Patient Details  Name: Anita [name] MRN: [id number] Date of Birth: 01-01-1946  Subjective/Objective:     Patient live with spouse, per pt eval rec hhpt, patient chose [facility] for [facility], referral made to [place].  Soc will begin 24-48 hrs post dc.                Action/Plan:   Expected Discharge Date:                  Expected Discharge Plan:  [place]  In-House Referral:     Discharge planning Services  CM Consult  Post Acute Care Choice:    Choice offered to:  Patient  DME Arranged:    DME Agency:     HH Arranged:  PT [place]:  [place]  Status of Service:  Completed, signed off  Medicare Important Message Given:    Date Medicare IM Given:    Medicare IM give by:    Date Additional Medicare IM Given:    Additional Medicare Important Message give by:     If discussed at [place] of Stay Meetings, dates discussed:    Additional Comments:  Zenon Mayo, RN 04/05/2015, 2:30 PM

## 2015-04-05 NOTE — Evaluation (Signed)
Physical Therapy Evaluation Patient Details Name: Anita [name] MRN: [id number] DOB: May 09, 1946 Today's Date: 68/04/2015   History of Present Illness  69 year old female with past medical history of COPD, type 2 diabetes, CAD, CTD stage III, chronic diastolic heart failure, and history of breast cancer presents after sustaining a laceration to her right lower extremity. Patient was attempting to get onto the left to get onto a SCAT bus when she checked and fell, patient sustained a large laceration to her right lower extremity, she denies associated loss of consciousness/chest pain/shortness of breath/syncope. The patient was evaluated in the ED and had a total of 21 sutures placed in the right lower extremity.   Clinical Impression  Pt presents with above.  Note pt limited to short distance gait, however she states that she does not ambulate much at home and needs to take several rests breaks when ambulatory in community.  Highly recommend a rollator for pt for balance, support and energy conservation however pt declined.  Recommend HHPT for follow up and will therefore sign off on pt at this time as she is to D/C today, will defer to HHPT.     Follow Up Recommendations Home health PT;Supervision for mobility/OOB    Equipment Recommendations  None recommended by PT    Recommendations for Other Services       Precautions / Restrictions Precautions Precautions: Fall Precaution Comments: needs O2 during gait Restrictions Weight Bearing Restrictions: No      Mobility  Bed Mobility               General bed mobility comments: Pt in recliner when PT arrived.   Transfers Overall transfer level: Modified independent Equipment used: None                Ambulation/Gait Ambulation/Gait assistance: Supervision Ambulation Distance (Feet): 75 Feet Assistive device: None Gait Pattern/deviations: Wide base of support;Trunk flexed;Trendelenburg     General Gait Details: Pt with  very wide BOS and gets very DOE with short distance gait.  Note that pt unwilling to use AD however feel she would really benefit from use of rollator for support and energy conservation.  Discussed with pt, she still refused.   Stairs            Wheelchair Mobility    Modified Rankin (Stroke Patients Only)       Balance Overall balance assessment: Modified Independent                                           Pertinent Vitals/Pain Pain Assessment: No/denies pain    Home Living Family/patient expects to be discharged to:: Private residence Living Arrangements: Spouse/significant other Available Help at Discharge: Family;Available 24 hours/day (states husband is there most of time) Type of Home: House Home Access: Ramped entrance     Home Layout: One level Home Equipment: Cane - quad;Bedside commode;Wheelchair - manual      Prior Function Level of Independence: Independent         Comments: takes SCAT to appointments and grocery store, attends classes at [employer]: Right    Extremity/Trunk Assessment               Lower Extremity Assessment: Generalized weakness      Cervical / Trunk Assessment: Normal  Communication   Communication: No difficulties  Cognition Arousal/Alertness: Awake/alert Behavior During Therapy: WFL for tasks assessed/performed Overall Cognitive Status: Within Functional Limits for tasks assessed                      General Comments      Exercises        Assessment/Plan    PT Assessment All further PT needs can be met in the next venue of care  PT Diagnosis Difficulty walking;Generalized weakness   PT Problem List Decreased strength;Decreased activity tolerance;Decreased balance;Decreased mobility;Cardiopulmonary status limiting activity  PT Treatment Interventions     PT Goals (Current goals can be found in the Care Plan section) Acute Rehab PT  Goals Patient Stated Goal: to return home PT Goal Formulation: With patient Time For Goal Achievement: 04/12/15 Potential to Achieve Goals: Good    Frequency     Barriers to discharge        Co-evaluation               End of Session Equipment Utilized During Treatment: Oxygen Activity Tolerance: Patient limited by fatigue Patient left: in chair;with call bell/phone within reach;with nursing/sitter in room Nurse Communication: Mobility status;Precautions    Functional Assessment Tool Used: clinical judgement Functional Limitation: Mobility: Walking and moving around Mobility: Walking and Moving Around Current Status (L9532): At least 1 percent but less than 20 percent impaired, limited or restricted Mobility: Walking and Moving Around Goal Status [phone]): At least 1 percent but less than 20 percent impaired, limited or restricted Mobility: Walking and Moving Around Discharge Status [phone]): At least 1 percent but less than 20 percent impaired, limited or restricted    Time: 1683-7290 PT Time Calculation (min) (ACUTE ONLY): 17 min   Charges:   PT Evaluation $Initial PT Evaluation Tier I: 1 Procedure     PT G Codes:   PT G-Codes **NOT FOR INPATIENT CLASS** Functional Assessment Tool Used: clinical judgement Functional Limitation: Mobility: Walking and moving around Mobility: Walking and Moving Around Current Status (S1115): At least 1 percent but less than 20 percent impaired, limited or restricted Mobility: Walking and Moving Around Goal Status [phone]): At least 1 percent but less than 20 percent impaired, limited or restricted Mobility: Walking and Moving Around Discharge Status [phone]): At least 1 percent but less than 20 percent impaired, limited or restricted    Denice Bors 68/04/2015, 12:34 PM

## 2015-04-06 ENCOUNTER — Ambulatory Visit
Admission: RE | Admit: 2015-04-06 | Discharge: 2015-04-06 | Disposition: A | Discharge status: Home / Self Care | Payer: Commercial Managed Care - HMO | Source: Ambulatory Visit | Attending: Radiation Oncology | Admitting: Radiation Oncology

## 2015-04-06 ENCOUNTER — Encounter: Payer: Self-pay | Admitting: Radiation Oncology

## 2015-04-06 VITALS — BP 102/53 | HR 95 | Temp 98.4°F | Resp 20 | Ht 62.0 in | Wt 228.7 lb

## 2015-04-06 DIAGNOSIS — J383 Other diseases of vocal cords: Secondary | ICD-10-CM

## 2015-04-06 DIAGNOSIS — Z510 Encounter for antineoplastic radiation therapy: Secondary | ICD-10-CM | POA: Insufficient documentation

## 2015-04-06 DIAGNOSIS — C7951 Secondary malignant neoplasm of bone: Secondary | ICD-10-CM | POA: Insufficient documentation

## 2015-04-06 DIAGNOSIS — C50919 Malignant neoplasm of unspecified site of unspecified female breast: Secondary | ICD-10-CM | POA: Diagnosis present

## 2015-04-06 LAB — TYPE AND SCREEN
ABO/RH(D): A POS
ANTIBODY SCREEN: NEGATIVE
UNIT DIVISION: 0

## 2015-04-06 NOTE — Progress Notes (Signed)
Radiation Oncology         [phone] ________________________________  Name: Anita [name] MRN: [id number]  Date: 04/06/2015  DOB: Jul 01, 1946  Follow-Up Visit Note  CC: [name], DO  Thornell Sartorius, MD    ICD-9-CM ICD-10-CM   1. 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 Patient presented in 2015 with laryngeal lesion seen by Dr. Ernesto Rutherford .  In light of the patient's medical problems she was not felt to be a candidate for anesthesia and biopsy. She was noted on exam by Dr. Claria Dice to have a right anterior true vocal cord lesion. This lesion did not show up on imaging studies at that time. The  patient underwent a  recent PET CT scan which showed the the lymph node in the right neck to be unchanged in size but higher activity on PET scan. Patient is concerned that her neck problem may be worsening and is seen now for further evaluation. No  lesion was noted in the laryngeal area on CT/PET images    Reports pain in right leg, no problems talking or swallowing. She denies any hoarseness problems. She denies any pain within the neck area. No aspiration problems or choking sensation. No reports of wheezing   ALLERGIES:  is allergic to omnipaque and benzene.  Meds: Current Outpatient Prescriptions  Medication Sig Dispense Refill  . albuterol (PROVENTIL HFA;VENTOLIN HFA) 108 (90 BASE) MCG/ACT inhaler Inhale 2 puffs into the lungs every 6 (six) hours as needed for wheezing or shortness of breath. 3 Inhaler 3  . aspirin EC 81 MG EC tablet Take 1 tablet (81 mg total) by mouth daily. 30 tablet 3  . atorvastatin (LIPITOR) 40 MG tablet Take 40 mg by mouth daily.      . bag balm OINT ointment Apply 1 g topically as needed for dry skin or irritation. 300 g 11  . clotrimazole (LOTRIMIN) 1 % cream Apply 1 application topically daily. 113 g 3  . docusate sodium (COLACE) 100 MG capsule Take 100 mg by mouth daily.    . isosorbide mononitrate (IMDUR) 60 MG 24 hr tablet Take 1 tablet (60 mg total) by mouth daily. 90 tablet 3  . letrozole (FEMARA) 2.5 MG tablet Take 1 tablet (2.5 mg total) by mouth daily. 90 tablet 2  . mirtazapine (REMERON) 15 MG tablet Take 1 tablet (15 mg total) by mouth at bedtime. 30 tablet 5  . NON FORMULARY Place 3 L into the nose as needed (oxygen).     . palbociclib (IBRANCE) 125 MG capsule Take 1 capsule (125 mg total) by mouth See admin instructions. Take 125mg  every morning for 2 weeks on,  1 week off . 14 capsule 2  . potassium chloride SA (K-DUR,KLOR-CON) 20 MEQ tablet Take 2 tablets (40 mEq total) by mouth 2 (two) times daily. 120 tablet 6  . torsemide (DEMADEX) 20 MG tablet TAKE 2 TABLETS TWICE DAILY (Patient taking differently: TAKE 80  MG IN THE MORNING AND 40 MG AT NIGHT.) 360 tablet 5  . VITAMIN D, CHOLECALCIFEROL, PO Take 1,000 mg by mouth daily.     . nitroGLYCERIN (NITROSTAT) 0.4 MG SL tablet Place 1 tablet (0.4 mg  total) under the tongue every 5 (five) minutes as needed for chest pain. (Patient not taking: Reported on 04/06/2015) 30 tablet 0   No current facility-administered medications for this encounter.    Physical Findings: The patient is in no acute distress. Patient is alert and oriented.  height is 5\' 2"  (1.575 m) and weight is 228 lb 11.2 oz (103.738 kg). Her oral temperature is 98.4 F (36.9 C). Her blood pressure is 102/53 and her pulse is 95. Her respiration is 20 and oxygen saturation is 100%. .  No significant changes. General: Well-developed, in no acute distress, accompanied by her daughter on evaluation today HEENT: Normocephalic, atraumatic Cardiovascular: Regular rate and rhythm Respiratory: Clear to  auscultation bilaterally GI: Soft, nontender, normal bowel sounds Extremities: No edema present No palpable cervical, supraclavicular or axillary lymphoadenopathy. Recent fall injuring her right leg requiring sutures. I recommended fiberoptic exam to evaluate the questionable laryngeal area but the patient did not wish to proceed with this procedure today.   Lab Findings: Lab Results  Component Value Date   WBC 3.5* 04/05/2015   HGB 8.6* 04/05/2015   HCT 27.0* 04/05/2015   MCV 87.4 04/05/2015   PLT 204 04/05/2015    Radiographic Findings: Dg Chest 2 View  04/04/2015   CLINICAL DATA:  69 year old female with history of trauma from fall today with laceration to the right leg.  EXAM: CHEST  2 VIEW  COMPARISON:  Chest x-ray 10/15/2014.  FINDINGS: Lung volumes are normal. No consolidative airspace disease. No pleural effusions. No pneumothorax. No pulmonary nodule or mass noted. Pulmonary vasculature and the cardiomediastinal silhouette are within normal limits. The heart size and mediastinal contours are within normal limits. Atherosclerosis in the thoracic aorta. Old healed fracture of the posterior aspect of the left sixth rib, similar to the prior examination. Well-defined lucent lesion in the lateral aspect of the right clavicle, similar to the prior examination, but new compared to more remote prior study from 07/06/2013, presumably a metastatic lesion.  IMPRESSION: 1. No radiographic evidence of acute cardiopulmonary disease. 2. Atherosclerosis. 3. Lytic lesion in the lateral aspect of the right clavicle, presumably a metastatic lesion.   Electronically Signed   By: Vinnie Langton M.D.   On: 04/04/2015 18:17   Dg Tibia/fibula Right  04/04/2015   CLINICAL DATA:  69 year old female with history of trauma from fall today with laceration to the right leg.  EXAM: RIGHT TIBIA AND FIBULA - 2 VIEW  COMPARISON:  No priors.  FINDINGS: There is no evidence of fracture or other focal bone lesions. Soft  tissues are unremarkable.  IMPRESSION: Negative.   Electronically Signed   By: Vinnie Langton M.D.   On: 04/04/2015 18:08    Impression: Questionable Laryngeal lesion,  PET positive right neck node, with history of metastatic breast cancer with painful osseous metastasis. The patient did not wish to undergo fiberoptic examination or indirect mirror examination to further evaluate the laryngeal area. I discussed with the patient that the lesion did not show up on recent imaging and she has no symptoms at this time to suggest a laryngeal primary. I discussed with the patient that treatment to the neck area has significant side effects which would be difficult for her to tolerate given her overall poor performance status. In Light of these above issues I did not recommend radiation treatment to this area area  Plan:  Follow-up and repeat PET scan with patient in 6 months.  She will continue on therapy for her metastatic breast  cancer under the direction of Dr. Burr Medico.  -----------------------------------  Blair Promise, PhD, MD  This document serves as a record of services personally performed by Gery Pray, MD. It was created on his behalf by Derek Mound, a trained medical scribe. The creation of this record is based on the scribe's personal observations and the provider's statements to them. This document has been checked and approved by the attending provider.

## 2015-04-06 NOTE — Progress Notes (Signed)
Please see the Nurse Progress Note in the MD Initial Consult Encounter for this patient. 

## 2015-04-06 NOTE — Telephone Encounter (Signed)
Called pt, no answer, no voicemail. If pt calls back, please inform her of the information below. Ottis Stain, CMA

## 2015-04-07 ENCOUNTER — Encounter (HOSPITAL_BASED_OUTPATIENT_CLINIC_OR_DEPARTMENT_OTHER): Payer: Commercial Managed Care - HMO | Attending: Internal Medicine

## 2015-04-07 DIAGNOSIS — L97211 Non-pressure chronic ulcer of right calf limited to breakdown of skin: Secondary | ICD-10-CM | POA: Diagnosis not present

## 2015-04-07 DIAGNOSIS — L97221 Non-pressure chronic ulcer of left calf limited to breakdown of skin: Secondary | ICD-10-CM | POA: Diagnosis not present

## 2015-04-07 DIAGNOSIS — I83222 Varicose veins of left lower extremity with both ulcer of calf and inflammation: Secondary | ICD-10-CM | POA: Diagnosis not present

## 2015-04-07 DIAGNOSIS — I83212 Varicose veins of right lower extremity with both ulcer of calf and inflammation: Secondary | ICD-10-CM | POA: Insufficient documentation

## 2015-04-07 DIAGNOSIS — R600 Localized edema: Secondary | ICD-10-CM | POA: Diagnosis not present

## 2015-04-08 ENCOUNTER — Encounter: Payer: Self-pay | Admitting: *Deleted

## 2015-04-08 NOTE — Progress Notes (Signed)
[facility] Psychosocial Distress Screening Clinical Social Work  Clinical Social Work was referred by distress screening protocol.  The patient scored a 7 on the Psychosocial Distress Thermometer which indicates severe distress. Clinical Social Worker phoned pt to assess for distress and other psychosocial needs. Pt reports she uses [employer] for transportation, that appears to working well for her even after her fall the other day. She reports to have good support from daughter and husband. She attends [employer] and starts next week. Pt reports to have financial concerns mainly with utility bills. She was unaware this was available at [facility]. CSW advised pt how to apply. She may also qualify for Cancer Care funds as well. CSW agrees to reach out as needed.   ONCBCN DISTRESS SCREENING 04/06/2015  Screening Type Change in Status  Distress experienced in past week (1-10) 7  Practical problem type Transportation  Emotional problem type   Information Concerns Type Lack of info about diagnosis;Lack of info about maintaining fitness  Physical Problem type Pain;Skin dry/itchy  Physician notified of physical symptoms     Clinical Social Worker follow up needed: No.  If yes, follow up plan: Loren Racer, [place]  [facility] Phone: [phone] Fax: [phone]

## 2015-04-08 NOTE — Telephone Encounter (Signed)
Spoke with pt. Told her script for potassium is a [facility], lipid panel looked fine and that her statin is at the correct dose right now. Ottis Stain, CMA

## 2015-04-11 ENCOUNTER — Other Ambulatory Visit: Payer: Self-pay | Admitting: *Deleted

## 2015-04-11 ENCOUNTER — Telehealth: Payer: Self-pay | Admitting: Internal Medicine

## 2015-04-11 NOTE — Telephone Encounter (Signed)
Pt called and needs a refill on her Isosorbide and Torsemide called in. jw

## 2015-04-12 ENCOUNTER — Other Ambulatory Visit (HOSPITAL_BASED_OUTPATIENT_CLINIC_OR_DEPARTMENT_OTHER): Payer: Commercial Managed Care - HMO

## 2015-04-12 ENCOUNTER — Ambulatory Visit (HOSPITAL_BASED_OUTPATIENT_CLINIC_OR_DEPARTMENT_OTHER): Payer: Commercial Managed Care - HMO

## 2015-04-12 DIAGNOSIS — C50919 Malignant neoplasm of unspecified site of unspecified female breast: Secondary | ICD-10-CM

## 2015-04-12 DIAGNOSIS — C50911 Malignant neoplasm of unspecified site of right female breast: Secondary | ICD-10-CM

## 2015-04-12 DIAGNOSIS — C7951 Secondary malignant neoplasm of bone: Secondary | ICD-10-CM | POA: Diagnosis not present

## 2015-04-12 DIAGNOSIS — D638 Anemia in other chronic diseases classified elsewhere: Secondary | ICD-10-CM

## 2015-04-12 LAB — CBC & DIFF AND RETIC
BASO%: 0.3 % (ref 0.0–2.0)
Basophils Absolute: 0 10*3/uL (ref 0.0–0.1)
EOS ABS: 0 10*3/uL (ref 0.0–0.5)
EOS%: 1 % (ref 0.0–7.0)
HCT: 25.7 % — ABNORMAL LOW (ref 34.8–46.6)
HEMOGLOBIN: 8.3 g/dL — AB (ref 11.6–15.9)
Immature Retic Fract: 17.4 % — ABNORMAL HIGH (ref 1.60–10.00)
LYMPH%: 11.5 % — AB (ref 14.0–49.7)
MCH: 28.8 pg (ref 25.1–34.0)
MCHC: 32.3 g/dL (ref 31.5–36.0)
MCV: 89.2 fL (ref 79.5–101.0)
MONO#: 0.1 10*3/uL (ref 0.1–0.9)
MONO%: 4.9 % (ref 0.0–14.0)
NEUT%: 82.3 % — AB (ref 38.4–76.8)
NEUTROS ABS: 2.4 10*3/uL (ref 1.5–6.5)
PLATELETS: 258 10*3/uL (ref 145–400)
RBC: 2.88 10*6/uL — ABNORMAL LOW (ref 3.70–5.45)
RDW: 17.9 % — ABNORMAL HIGH (ref 11.2–14.5)
Retic %: 2.25 % — ABNORMAL HIGH (ref 0.70–2.10)
Retic Ct Abs: 64.8 10*3/uL (ref 33.70–90.70)
WBC: 2.9 10*3/uL — ABNORMAL LOW (ref 3.9–10.3)
lymph#: 0.3 10*3/uL — ABNORMAL LOW (ref 0.9–3.3)

## 2015-04-12 LAB — COMPREHENSIVE METABOLIC PANEL (CC13)
ALK PHOS: 106 U/L (ref 40–150)
ALT: 25 U/L (ref 0–55)
AST: 22 U/L (ref 5–34)
Albumin: 3 g/dL — ABNORMAL LOW (ref 3.5–5.0)
Anion Gap: 13 mEq/L — ABNORMAL HIGH (ref 3–11)
BUN: 34.6 mg/dL — ABNORMAL HIGH (ref 7.0–26.0)
CO2: 24 mEq/L (ref 22–29)
Calcium: 9.8 mg/dL (ref 8.4–10.4)
Chloride: 100 mEq/L (ref 98–109)
Creatinine: 2.2 mg/dL — ABNORMAL HIGH (ref 0.6–1.1)
EGFR: 23 mL/min/{1.73_m2} — ABNORMAL LOW (ref >90)
GLUCOSE: 223 mg/dL — AB (ref 70–140)
POTASSIUM: 3.8 meq/L (ref 3.5–5.1)
SODIUM: 137 meq/L (ref 136–145)
Total Bilirubin: 0.28 mg/dL (ref 0.20–1.20)
Total Protein: 6.8 g/dL (ref 6.4–8.3)

## 2015-04-12 MED ORDER — ISOSORBIDE MONONITRATE ER 60 MG PO TB24: 60.0000 mg | ORAL_TABLET | Freq: Every day | ORAL | Status: DC

## 2015-04-12 MED ORDER — TORSEMIDE 20 MG PO TABS: ORAL_TABLET | ORAL | Status: DC

## 2015-04-12 MED ORDER — DENOSUMAB 120 MG/1.7ML ~~LOC~~ SOLN
120.0000 mg | Freq: Once | SUBCUTANEOUS | Status: AC
  Administered 2015-04-12: 120 mg via SUBCUTANEOUS
  Filled 2015-04-12: qty 1.7

## 2015-04-12 NOTE — Telephone Encounter (Signed)
Refill sent for Isosorbide and Torsemide.

## 2015-04-12 NOTE — Progress Notes (Signed)
Encouraged Anita [name] to continue taking her calcium supplement

## 2015-04-13 ENCOUNTER — Encounter: Payer: Self-pay | Admitting: Internal Medicine

## 2015-04-13 ENCOUNTER — Ambulatory Visit (INDEPENDENT_AMBULATORY_CARE_PROVIDER_SITE_OTHER): Payer: Commercial Managed Care - HMO | Admitting: Internal Medicine

## 2015-04-13 ENCOUNTER — Ambulatory Visit: Payer: Medicare HMO | Admitting: Family Medicine

## 2015-04-13 VITALS — BP 104/57 | HR 87 | Temp 98.3°F | Wt 226.0 lb

## 2015-04-13 DIAGNOSIS — D638 Anemia in other chronic diseases classified elsewhere: Secondary | ICD-10-CM | POA: Diagnosis not present

## 2015-04-13 DIAGNOSIS — S81811D Laceration without foreign body, right lower leg, subsequent encounter: Secondary | ICD-10-CM

## 2015-04-13 DIAGNOSIS — D649 Anemia, unspecified: Secondary | ICD-10-CM

## 2015-04-13 LAB — CANCER ANTIGEN 27.29: CA 27.29: 50 U/mL — ABNORMAL HIGH (ref 0–39)

## 2015-04-13 LAB — POCT HEMOGLOBIN: HEMOGLOBIN: 7.6 g/dL — AB (ref 12.2–16.2)

## 2015-04-13 NOTE — Assessment & Plan Note (Signed)
HgB 7.8 in office today. Was 8.3 on CBC from yesterday. Patient is s/p 1 unit pRBC transfusion from hospital admission. Taking Fe. Willcontinue to monitor.

## 2015-04-13 NOTE — Assessment & Plan Note (Signed)
Still has stiches in place. Wound care to remove on Friday and follow healing process.  PT following. Patient states pain to that area is improving.

## 2015-04-13 NOTE — Progress Notes (Signed)
Subjective:    Patient ID: Anita [name], female    DOB: Sep 27, 1945, 69 y.o.   MRN: [id number]  HPI  CC: Hospital Follow-Up   Patient here for hospital follow up s/p admission for right leg laceration, acute blood loss, and hemodynamic instability. Patient fell while getting on lift for bus, causing a large right leg laceration requiring 21 stiches. Due to continued bleeding and a HgB of 7.1, she was transfused 1 unit pRBCs and HgB increased to 8.6. Patient is in wheelchair today. States that she is just using it temporarily as her leg heals, but that she is still walking.   Still has stiches in right leg. Wound care plans to remove at her appointment on Friday.   PT scheduled to come to her house on Thursday.   Review of Systems   See HPI for ROS. All other systems reviewed and are negative.  Past medical history, surgical, family, and social history reviewed and updated in the EMR as appropriate. Objective:  BP 104/57 mmHg  Pulse 87  Temp(Src) 98.3 F (36.8 C) (Oral)  Wt 226 lb (102.513 kg)  Pulse Ox: 94-96% Vitals and nursing note reviewed Gen: NAD. On Chronic O2, but not currently on it because tank ran out.  Lungs: Non labored Breathing  MSK: Patient does not want to have wound undressed. Extremities covered with compression stockings and wound dressing. 3+ edema to R LE. 2+ edema to L LE.     Assessment & Plan:  See Problem List Documentation  Discussed importance of checking in with patient s/p hospital admissions. Patient has extensive medical problem list and discussed reasons for patient to return prior to her next scheduled office visit.

## 2015-04-13 NOTE — Patient Instructions (Signed)
It was great seeing you today!   1. Make sure wound care removes your stiches.  2. Keep walking and working with Physical Therapy.    Please bring all your medications to every doctors visit  Sign up for My Chart to have easy access to your labs results, and communication with your Primary care physician.  Next Appointment  Please call to make an appointment with Dr. Juleen China in 3 months    I look forward to talking with you again at our next visit. If you have any questions or concerns before then, please call the clinic at [phone].  Take Care,   Dr. Phill Myron

## 2015-04-15 DIAGNOSIS — L97211 Non-pressure chronic ulcer of right calf limited to breakdown of skin: Secondary | ICD-10-CM | POA: Diagnosis not present

## 2015-04-15 DIAGNOSIS — L97221 Non-pressure chronic ulcer of left calf limited to breakdown of skin: Secondary | ICD-10-CM | POA: Diagnosis not present

## 2015-04-15 DIAGNOSIS — I83212 Varicose veins of right lower extremity with both ulcer of calf and inflammation: Secondary | ICD-10-CM | POA: Diagnosis not present

## 2015-04-15 DIAGNOSIS — I83222 Varicose veins of left lower extremity with both ulcer of calf and inflammation: Secondary | ICD-10-CM | POA: Diagnosis not present

## 2015-04-22 DIAGNOSIS — L97211 Non-pressure chronic ulcer of right calf limited to breakdown of skin: Secondary | ICD-10-CM | POA: Diagnosis not present

## 2015-04-22 DIAGNOSIS — I83222 Varicose veins of left lower extremity with both ulcer of calf and inflammation: Secondary | ICD-10-CM | POA: Diagnosis not present

## 2015-04-22 DIAGNOSIS — L97221 Non-pressure chronic ulcer of left calf limited to breakdown of skin: Secondary | ICD-10-CM | POA: Diagnosis not present

## 2015-04-22 DIAGNOSIS — I83212 Varicose veins of right lower extremity with both ulcer of calf and inflammation: Secondary | ICD-10-CM | POA: Diagnosis not present

## 2015-04-22 LAB — GLUCOSE, CAPILLARY: Glucose-Capillary: 174 mg/dL — ABNORMAL HIGH (ref 65–99)

## 2015-04-24 ENCOUNTER — Telehealth: Payer: Self-pay | Admitting: Family Medicine

## 2015-04-24 NOTE — Telephone Encounter (Signed)
Patient called to inquire about safety of protein drink that is promoted to help with wound care. Advised that if her nutrition is inadequate, the drink may be beneficial and it is safe.

## 2015-04-29 ENCOUNTER — Encounter (HOSPITAL_BASED_OUTPATIENT_CLINIC_OR_DEPARTMENT_OTHER): Payer: Commercial Managed Care - HMO | Attending: Internal Medicine

## 2015-04-29 DIAGNOSIS — W19XXXD Unspecified fall, subsequent encounter: Secondary | ICD-10-CM | POA: Diagnosis not present

## 2015-04-29 DIAGNOSIS — T8133XD Disruption of traumatic injury wound repair, subsequent encounter: Secondary | ICD-10-CM | POA: Diagnosis not present

## 2015-04-29 DIAGNOSIS — S76221S Laceration of adductor muscle, fascia and tendon of right thigh, sequela: Secondary | ICD-10-CM | POA: Diagnosis not present

## 2015-04-29 DIAGNOSIS — I83893 Varicose veins of bilateral lower extremities with other complications: Secondary | ICD-10-CM | POA: Insufficient documentation

## 2015-05-06 DIAGNOSIS — I83893 Varicose veins of bilateral lower extremities with other complications: Secondary | ICD-10-CM | POA: Diagnosis not present

## 2015-05-06 DIAGNOSIS — T8133XD Disruption of traumatic injury wound repair, subsequent encounter: Secondary | ICD-10-CM | POA: Diagnosis not present

## 2015-05-06 DIAGNOSIS — S76221S Laceration of adductor muscle, fascia and tendon of right thigh, sequela: Secondary | ICD-10-CM | POA: Diagnosis not present

## 2015-05-10 ENCOUNTER — Other Ambulatory Visit (HOSPITAL_BASED_OUTPATIENT_CLINIC_OR_DEPARTMENT_OTHER): Payer: Commercial Managed Care - HMO

## 2015-05-10 ENCOUNTER — Ambulatory Visit (HOSPITAL_BASED_OUTPATIENT_CLINIC_OR_DEPARTMENT_OTHER): Payer: Commercial Managed Care - HMO | Admitting: Hematology

## 2015-05-10 ENCOUNTER — Telehealth: Payer: Self-pay | Admitting: Hematology

## 2015-05-10 ENCOUNTER — Encounter: Payer: Self-pay | Admitting: Hematology

## 2015-05-10 ENCOUNTER — Ambulatory Visit (HOSPITAL_BASED_OUTPATIENT_CLINIC_OR_DEPARTMENT_OTHER): Payer: Commercial Managed Care - HMO

## 2015-05-10 VITALS — BP 113/48 | HR 92 | Temp 98.0°F | Resp 18 | Ht 62.0 in | Wt 228.8 lb

## 2015-05-10 DIAGNOSIS — C50919 Malignant neoplasm of unspecified site of unspecified female breast: Secondary | ICD-10-CM

## 2015-05-10 DIAGNOSIS — C50911 Malignant neoplasm of unspecified site of right female breast: Secondary | ICD-10-CM

## 2015-05-10 DIAGNOSIS — C7951 Secondary malignant neoplasm of bone: Secondary | ICD-10-CM

## 2015-05-10 DIAGNOSIS — C7800 Secondary malignant neoplasm of unspecified lung: Secondary | ICD-10-CM

## 2015-05-10 DIAGNOSIS — D638 Anemia in other chronic diseases classified elsewhere: Secondary | ICD-10-CM

## 2015-05-10 DIAGNOSIS — D649 Anemia, unspecified: Secondary | ICD-10-CM

## 2015-05-10 DIAGNOSIS — D72819 Decreased white blood cell count, unspecified: Secondary | ICD-10-CM

## 2015-05-10 LAB — CBC & DIFF AND RETIC
BASO%: 0.8 % (ref 0.0–2.0)
BASOS ABS: 0 10*3/uL (ref 0.0–0.1)
EOS ABS: 0.1 10*3/uL (ref 0.0–0.5)
EOS%: 4 % (ref 0.0–7.0)
HEMATOCRIT: 28.5 % — AB (ref 34.8–46.6)
HEMOGLOBIN: 9 g/dL — AB (ref 11.6–15.9)
Immature Retic Fract: 24.7 % — ABNORMAL HIGH (ref 1.60–10.00)
LYMPH#: 0.4 10*3/uL — AB (ref 0.9–3.3)
LYMPH%: 15.5 % (ref 14.0–49.7)
MCH: 28.4 pg (ref 25.1–34.0)
MCHC: 31.6 g/dL (ref 31.5–36.0)
MCV: 89.9 fL (ref 79.5–101.0)
MONO#: 0.2 10*3/uL (ref 0.1–0.9)
MONO%: 6.3 % (ref 0.0–14.0)
NEUT#: 1.9 10*3/uL (ref 1.5–6.5)
NEUT%: 73.4 % (ref 38.4–76.8)
Platelets: 167 10*3/uL (ref 145–400)
RBC: 3.17 10*6/uL — ABNORMAL LOW (ref 3.70–5.45)
RDW: 19 % — AB (ref 11.2–14.5)
RETIC %: 3.39 % — AB (ref 0.70–2.10)
RETIC CT ABS: 107.46 10*3/uL — AB (ref 33.70–90.70)
WBC: 2.5 10*3/uL — ABNORMAL LOW (ref 3.9–10.3)

## 2015-05-10 LAB — COMPREHENSIVE METABOLIC PANEL (CC13)
ALBUMIN: 3.3 g/dL — AB (ref 3.5–5.0)
ALK PHOS: 96 U/L (ref 40–150)
ALT: 28 U/L (ref 0–55)
AST: 25 U/L (ref 5–34)
Anion Gap: 10 mEq/L (ref 3–11)
BUN: 45.2 mg/dL — AB (ref 7.0–26.0)
CALCIUM: 10 mg/dL (ref 8.4–10.4)
CHLORIDE: 102 meq/L (ref 98–109)
CO2: 26 mEq/L (ref 22–29)
Creatinine: 1.8 mg/dL — ABNORMAL HIGH (ref 0.6–1.1)
EGFR: 28 mL/min/{1.73_m2} — ABNORMAL LOW (ref >90)
Glucose: 193 mg/dl — ABNORMAL HIGH (ref 70–140)
Potassium: 3.8 mEq/L (ref 3.5–5.1)
Sodium: 139 mEq/L (ref 136–145)
Total Bilirubin: 0.35 mg/dL (ref 0.20–1.20)
Total Protein: 6.9 g/dL (ref 6.4–8.3)

## 2015-05-10 LAB — CANCER ANTIGEN 27.29: CA 27.29: 64 U/mL — AB (ref 0–39)

## 2015-05-10 MED ORDER — LETROZOLE 2.5 MG PO TABS: 2.5000 mg | ORAL_TABLET | Freq: Every day | ORAL | Status: DC

## 2015-05-10 MED ORDER — DENOSUMAB 120 MG/1.7ML ~~LOC~~ SOLN
120.0000 mg | Freq: Once | SUBCUTANEOUS | Status: AC
  Administered 2015-05-10: 120 mg via SUBCUTANEOUS
  Filled 2015-05-10: qty 1.7

## 2015-05-10 NOTE — Progress Notes (Signed)
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 She started letrozole in 11/2012.  Added Palbociclib 125 mg daily for 21 days on and 7 days off on 01/01/2014 with disease progression. Dose modified to 2 weeks on 1 week off with cycle 3.   2. zometa added on 01/01/2014 for metastatic bone disease. Zometa held due to renal insufficiency and changed to [facility] because of skeletal disease progression and safe renal profile. 3. Started palliative XRT to the left and right femur (30 Gy in 10 fractions) on 02/02/2014 per Dr. Gery Pray which was finished on 02/24/2014.  INTERVAL HISTORY:  Anita [name] 69 y.o. female with a history of right breast cancer with metastases to the bone/lungs here for follow-up.   Today, she came in a wheelchair by her self. She had a mechanical fall on 8/9, she had significant injury to her right leg, and open wound in the anterior tibia,  21 stitches and 1u blood transfusion. She was released from hospital the next day. She has been following up with wound clinic since then. She is able to walk around with a walker. She is back to school, and takes 3 classes this semester. Her shortness breast is stable, she is on oxygen, 3 L. No other new complaints.   MEDICAL HISTORY: Past Medical History  Diagnosis Date  . Fibroid   . Morbid obesity   . 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 COPD (chronic obstructive pulmonary disease)   . Chronic diastolic CHF (congestive heart failure)   . NSTEMI (non-ST elevated myocardial infarction)     Cath November 2014 LAD 40% stenosis, first diagonal 80% stenosis, circumflex 20% stenosis, right coronary artery occluded. The EF was 35-40% at that time.  .  Anemia     a. Adm 09/2012 with melena, Hgb 5.8 -> transfused. EGD/colonoscopy unrevealing.  . Breast cancer     a. Mets to bone. ER 100%/ PR 0%/Her2 neu negative.  [name] Ulcers of both lower legs   . Tobacco abuse   . Hyperlipidemia 02/11/2013  . Chronic respiratory failure     a. On O2 qhs. also portable O2.  . Chronic renal insufficiency   . Lesion of vocal cord     a. CT 05/2013 concerning for tumor.  . Shortness of breath   . Depression   . Diabetes mellitus without complication   . Anginal pain   . Dementia     very mild  . Radiation 02/02/14-02/24/14    left and right femur 30 gray    ALLERGIES:  is allergic to omnipaque and benzene.  MEDICATIONS:    Medication List       This list is accurate as of: 05/10/15 10:12 AM.  Always use your most recent med list.               albuterol 108 (90 BASE) MCG/ACT inhaler  Commonly known as:  PROVENTIL HFA;VENTOLIN HFA  Inhale 2 puffs into the lungs every 6 (six) hours as needed for wheezing or shortness of breath.     aspirin 81 MG EC tablet  Take 1 tablet (81 mg total) by mouth daily.     atorvastatin 40 MG tablet  Commonly  known as:  LIPITOR  Take 40 mg by mouth daily.     bag balm Oint ointment  Apply 1 g topically as needed for dry skin or irritation.     clotrimazole 1 % cream  Commonly known as:  LOTRIMIN  Apply 1 application topically daily.     docusate sodium 100 MG capsule  Commonly known as:  COLACE  Take 100 mg by mouth daily.     isosorbide mononitrate 60 MG 24 hr tablet  Commonly known as:  IMDUR  Take 1 tablet (60 mg total) by mouth daily.     letrozole 2.5 MG tablet  Commonly known as:  FEMARA  Take 1 tablet (2.5 mg total) by mouth daily.     mirtazapine 15 MG tablet  Commonly known as:  REMERON  Take 1 tablet (15 mg total) by mouth at bedtime.     nitroGLYCERIN 0.4 MG SL tablet  Commonly known as:  NITROSTAT  Place 1 tablet (0.4 mg total) under the tongue every 5 (five) minutes as needed for  chest pain.     NON FORMULARY  Place 3 L into the nose as needed (oxygen).     palbociclib 125 MG capsule  Commonly known as:  IBRANCE  Take 1 capsule (125 mg total) by mouth See admin instructions. Take 125mg  every morning for 2 weeks on,  1 week off .     potassium chloride SA 20 MEQ tablet  Commonly known as:  K-DUR,KLOR-CON  Take 2 tablets (40 mEq total) by mouth 2 (two) times daily.     torsemide 20 MG tablet  Commonly known as:  DEMADEX  TAKE 80  MG IN THE MORNING AND 40 MG AT NIGHT.     VITAMIN D (CHOLECALCIFEROL) PO  Take 1,000 mg by mouth daily.        SURGICAL HISTORY:  Past Surgical History  Procedure Laterality Date  . Colonoscopy, esophagogastroduodenoscopy (egd) and esophageal dilation N/A 10/13/2012    Procedure: colonoscopy and egd;  Surgeon: Wonda Horner, MD;  Location: [place];  Service: Endoscopy;  Laterality: N/A;  . Left heart catheterization with coronary angiogram N/A 07/07/2013    Procedure: LEFT HEART CATHETERIZATION WITH CORONARY ANGIOGRAM;  Surgeon: Peter M Martinique, MD;  Location: [facility] CATH LAB;  Service: Cardiovascular;  Laterality: N/A;   REVIEW OF SYSTEMS:   Constitutional: Denies fevers, chills or abnormal weight loss Eyes: Denies blurriness of vision Ears, nose, mouth, throat, and face: Denies mucositis or sore throat Respiratory: Denies cough, (+) stable dyspnea, no wheezes Cardiovascular: Denies palpitation, chest discomfort or lower extremity swelling Gastrointestinal:  Denies nausea, heartburn or change in bowel habits Skin: Denies abnormal skin rashes Lymphatics: Denies new lymphadenopathy or easy bruising Neurological:Denies numbness, tingling or new weaknesses Behavioral/Psych: Mood is stable, no new changes  All other systems were reviewed with the patient and are negative.  PHYSICAL EXAMINATION: ECOG PERFORMANCE STATUS: 2-3  Blood pressure 113/48, pulse 92, temperature 98 F (36.7 C), temperature source Oral, resp. rate 18,  height 5\' 2"  (1.575 m), weight 228 lb 12.8 oz (103.783 kg), SpO2 98 %.  GENERAL:alert, no distress and comfortable morbidly obese woman in wheelchair, chronically ill appearing SKIN: skin color, texture, turgor are normal; posterior legs with irritation and erythema with a small abrasion on the posterior left thigh with mild TTP.  EYES: normal, Conjunctiva are pink and non-injected, sclera clear OROPHARYNX:no exudate, no erythema and lips, buccal mucosa, and tongue normal  NECK: supple, thyroid normal size, non-tender, without  nodularity LYMPH:  no palpable lymphadenopathy in the cervical, axillary LUNGS: coarse breathing with slightly increased breathing rate HEART: regular rate & rhythm and no murmurs and 2+ lower extremity edema BREAST: deferred ABDOMEN:abdomen soft, non-tender and normal bowel sounds; obese Musculoskeletal:no cyanosis of digits and no clubbing; bilateral lower extremities are wrapped with gauze and a bandage up to mid calf. I can see the top of the open wound in the right anterior tibia.  NEURO: alert & oriented x 3 with fluent speech, no focal motor/sensory deficits  LABORATORY DATA: CBC Latest Ref Rng 05/10/2015 04/13/2015 04/12/2015  WBC 3.9 - 10.3 10e3/uL 2.5(L) - 2.9(L)  Hemoglobin 11.6 - 15.9 g/dL 9.0(L) 7.6(A) 8.3(L)  Hematocrit 34.8 - 46.6 % 28.5(L) - 25.7(L)  Platelets 145 - 400 10e3/uL 167 - 258    CMP Latest Ref Rng 05/10/2015 04/12/2015 04/05/2015  Glucose 70 - 140 mg/dl 193(H) 223(H) 153(H)  BUN 7.0 - 26.0 mg/dL 45.2(H) 34.6(H) 25(H)  Creatinine 0.6 - 1.1 mg/dL 1.8(H) 2.2(H) 1.58(H)  Sodium 136 - 145 mEq/L 139 137 139  Potassium 3.5 - 5.1 mEq/L 3.8 3.8 3.6  Chloride 101 - 111 mmol/L - - 105  CO2 22 - 29 mEq/L $Remove'26 24 28  'PIQHJdr$ Calcium 8.4 - 10.4 mg/dL 10.0 9.8 8.0(L)  Total Protein 6.4 - 8.3 g/dL 6.9 6.8 -  Total Bilirubin 0.20 - 1.20 mg/dL 0.35 0.28 -  Alkaline Phos 40 - 150 U/L 96 106 -  AST 5 - 34 U/L 25 22 -  ALT 0 - 55 U/L 28 25 -     RADIOGRAPHIC  STUDIES:  CT chest abdomen and pelvis 09/01/2014 IMPRESSION: Chest Impression:  1. Stable exam of thorax. 2. Several small smudgy pulmonary nodules are not changed. 3. Stable widespread bony metastasis pneumothorax.  Abdomen / Pelvis Impression:  1. Stable small periaortic retroperitoneal lymph node. 2. No evidence disease progression. 3. Stable widespread skeletal metastasis. 4. Left sided staghorn calculus again demonstrated. 5. Nodular focus in the right kidney at site of prior renal cyst is not changed  PET 02/08/2015 IMPRESSION: 1. Stable 8 mm right level 3 lymph node which is now more metabolically active with SUV max of 8.8 (previously 2.9). No new neck adenopathy. 2. Stable diffuse osseous metastatic disease. 3. Stable pulmonary nodules. No progressive findings. 4. No findings for metastatic disease involving the abdomen/pelvis.   ASSESSMENT: Metastatic breast cancer on palliative letrozole plus Palbociclib and Xgeva for bone mets.   PLAN:  1.  Metastatic breast cancer (ER pos/PR neg/ HER2 neg) with metastasis to bones and lungs.  --She is clinically stable, metastatic bone pain much improved after palliating of radiation. -I reviewed her restaging PET scan from 02/08/2015, which showed overall stable disease, right level III cervical lymph node slightly more hypermetabolic, with stable small size, no other new lesions. - She is tolerating treatment well. Mild neutropenia and moderate anemia likely related to her cancer and palbo -Continue letrozole and Palbociclib 125 mg daily, 2 week on, 1 week off. We'll follow up his CBC closely. -Lab reviewed, mild leukopenia, ANC 1.9,. She is going to restart Ibrance next Monday 9/19 -If her right lower extremity wound does not he well, I would consider holding her Ibrance next Month   2. bone metastasis - continue Xgeva monthly  -Continue calcium and vitamin D  3. 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   --She  has been seen by radiation oncology and followed by Dr. Sondra Come.  -She never had any biopsy, not sure how the  diagnosed was made, she follows up with Dr. Sondra Come, who does not think she need radiation.   5. Leukopenia/Anemia of chronic disease  -- Past work up for reversible causes of anemia were negative.  -Stable moderate anemia, her hemoglobin is 8.3 today. We'll hold on transfusion, continue monitoring. -Repeated ferritin 255, serum iron 39 and saturation 14% in 11/2014 -She is not taking iron pill, could not tolerate.   6. CHF --She is not fluid overloaded today on exam. She is on Lasix, nitrogylerin prn, losartan. Increase lasix prn and weight daily. Cards follow up.   7. Diabetes mellitus, type II. Diet controlled.   8. COPD: On albuterol prn. She remains on home oxygen.  9. Chronic renal insufficiency.  -Stable.  10: Bilateral leg wound -Worsening right lower extremity wound after her fall in August -She still follows up with his wound clinic once a week with dressing change -If she has significant delay of wound healing, I'll hold on her [facility] next months  All questions were answered. The patient knows to call the clinic with any problems, questions or concerns. We can certainly see the patient much sooner if necessary.  Plan: -RTC in 1 month with lab. Continue monthly xgeva injection monthly  -Start Ibrance next Monday, continue letrozole daily, I reviewed her blood sugar today. -If her wound does not heal, I'll hold Ibrance for Oct  -She declined a flu shot due to her allergy to egg   I spent 25 minutes counseling the patient face to face. The total time spent in the appointment was 30 minutes.   Truitt Merle  05/10/2015

## 2015-05-10 NOTE — Telephone Encounter (Signed)
Gave and printed appt sched and avs for ptj for OCT thru [facility]

## 2015-05-12 ENCOUNTER — Emergency Department (HOSPITAL_COMMUNITY): Payer: Commercial Managed Care - HMO

## 2015-05-12 ENCOUNTER — Encounter (HOSPITAL_COMMUNITY): Payer: Self-pay | Admitting: Emergency Medicine

## 2015-05-12 ENCOUNTER — Inpatient Hospital Stay (HOSPITAL_COMMUNITY)
Admission: EM | Admit: 2015-05-12 | Discharge: 2015-05-15 | DRG: 872 | Disposition: A | Discharge status: Home / Self Care | Payer: Commercial Managed Care - HMO | Attending: Family Medicine | Admitting: Family Medicine

## 2015-05-12 ENCOUNTER — Other Ambulatory Visit: Payer: Self-pay

## 2015-05-12 DIAGNOSIS — S81801D Unspecified open wound, right lower leg, subsequent encounter: Secondary | ICD-10-CM | POA: Diagnosis not present

## 2015-05-12 DIAGNOSIS — N184 Chronic kidney disease, stage 4 (severe): Secondary | ICD-10-CM | POA: Diagnosis present

## 2015-05-12 DIAGNOSIS — L0390 Cellulitis, unspecified: Secondary | ICD-10-CM | POA: Insufficient documentation

## 2015-05-12 DIAGNOSIS — Z79899 Other long term (current) drug therapy: Secondary | ICD-10-CM

## 2015-05-12 DIAGNOSIS — Z515 Encounter for palliative care: Secondary | ICD-10-CM | POA: Insufficient documentation

## 2015-05-12 DIAGNOSIS — N179 Acute kidney failure, unspecified: Secondary | ICD-10-CM | POA: Diagnosis present

## 2015-05-12 DIAGNOSIS — J9610 Chronic respiratory failure, unspecified whether with hypoxia or hypercapnia: Secondary | ICD-10-CM | POA: Diagnosis present

## 2015-05-12 DIAGNOSIS — S81801A Unspecified open wound, right lower leg, initial encounter: Secondary | ICD-10-CM | POA: Diagnosis not present

## 2015-05-12 DIAGNOSIS — Z91048 Other nonmedicinal substance allergy status: Secondary | ICD-10-CM | POA: Diagnosis not present

## 2015-05-12 DIAGNOSIS — D649 Anemia, unspecified: Secondary | ICD-10-CM | POA: Diagnosis present

## 2015-05-12 DIAGNOSIS — C50911 Malignant neoplasm of unspecified site of right female breast: Secondary | ICD-10-CM | POA: Diagnosis present

## 2015-05-12 DIAGNOSIS — E119 Type 2 diabetes mellitus without complications: Secondary | ICD-10-CM

## 2015-05-12 DIAGNOSIS — Z86001 Personal history of in-situ neoplasm of cervix uteri: Secondary | ICD-10-CM | POA: Diagnosis not present

## 2015-05-12 DIAGNOSIS — Z7982 Long term (current) use of aspirin: Secondary | ICD-10-CM

## 2015-05-12 DIAGNOSIS — W19XXXD Unspecified fall, subsequent encounter: Secondary | ICD-10-CM | POA: Diagnosis present

## 2015-05-12 DIAGNOSIS — A419 Sepsis, unspecified organism: Secondary | ICD-10-CM | POA: Diagnosis present

## 2015-05-12 DIAGNOSIS — C329 Malignant neoplasm of larynx, unspecified: Secondary | ICD-10-CM | POA: Diagnosis present

## 2015-05-12 DIAGNOSIS — F329 Major depressive disorder, single episode, unspecified: Secondary | ICD-10-CM | POA: Diagnosis present

## 2015-05-12 DIAGNOSIS — Z170 Estrogen receptor positive status [ER+]: Secondary | ICD-10-CM

## 2015-05-12 DIAGNOSIS — I5032 Chronic diastolic (congestive) heart failure: Secondary | ICD-10-CM | POA: Diagnosis present

## 2015-05-12 DIAGNOSIS — Z923 Personal history of irradiation: Secondary | ICD-10-CM | POA: Diagnosis not present

## 2015-05-12 DIAGNOSIS — E785 Hyperlipidemia, unspecified: Secondary | ICD-10-CM | POA: Diagnosis present

## 2015-05-12 DIAGNOSIS — Z6839 Body mass index (BMI) 39.0-39.9, adult: Secondary | ICD-10-CM

## 2015-05-12 DIAGNOSIS — F0390 Unspecified dementia without behavioral disturbance: Secondary | ICD-10-CM | POA: Diagnosis present

## 2015-05-12 DIAGNOSIS — Z91041 Radiographic dye allergy status: Secondary | ICD-10-CM | POA: Diagnosis not present

## 2015-05-12 DIAGNOSIS — L03115 Cellulitis of right lower limb: Secondary | ICD-10-CM | POA: Diagnosis present

## 2015-05-12 DIAGNOSIS — D638 Anemia in other chronic diseases classified elsewhere: Secondary | ICD-10-CM | POA: Diagnosis present

## 2015-05-12 DIAGNOSIS — E1122 Type 2 diabetes mellitus with diabetic chronic kidney disease: Secondary | ICD-10-CM | POA: Diagnosis present

## 2015-05-12 DIAGNOSIS — R6520 Severe sepsis without septic shock: Secondary | ICD-10-CM | POA: Diagnosis present

## 2015-05-12 DIAGNOSIS — I2510 Atherosclerotic heart disease of native coronary artery without angina pectoris: Secondary | ICD-10-CM | POA: Diagnosis present

## 2015-05-12 DIAGNOSIS — I252 Old myocardial infarction: Secondary | ICD-10-CM

## 2015-05-12 DIAGNOSIS — C50919 Malignant neoplasm of unspecified site of unspecified female breast: Secondary | ICD-10-CM | POA: Diagnosis present

## 2015-05-12 DIAGNOSIS — J449 Chronic obstructive pulmonary disease, unspecified: Secondary | ICD-10-CM | POA: Diagnosis present

## 2015-05-12 DIAGNOSIS — Z87891 Personal history of nicotine dependence: Secondary | ICD-10-CM

## 2015-05-12 DIAGNOSIS — N183 Chronic kidney disease, stage 3 (moderate): Secondary | ICD-10-CM | POA: Diagnosis present

## 2015-05-12 DIAGNOSIS — C7951 Secondary malignant neoplasm of bone: Secondary | ICD-10-CM | POA: Diagnosis present

## 2015-05-12 LAB — URINALYSIS, ROUTINE W REFLEX MICROSCOPIC
Bilirubin Urine: NEGATIVE
Glucose, UA: NEGATIVE mg/dL
KETONES UR: NEGATIVE mg/dL
Nitrite: POSITIVE — AB
PROTEIN: 30 mg/dL — AB
Specific Gravity, Urine: 1.014 (ref 1.005–1.030)
UROBILINOGEN UA: 0.2 mg/dL (ref 0.0–1.0)
pH: 5.5 (ref 5.0–8.0)

## 2015-05-12 LAB — CBC WITH DIFFERENTIAL/PLATELET
BASOS ABS: 0 10*3/uL (ref 0.0–0.1)
Basophils Relative: 0 %
EOS ABS: 0 10*3/uL (ref 0.0–0.7)
Eosinophils Relative: 0 %
HCT: 28.4 % — ABNORMAL LOW (ref 36.0–46.0)
HEMOGLOBIN: 9.4 g/dL — AB (ref 12.0–15.0)
LYMPHS PCT: 6 %
Lymphs Abs: 0.1 10*3/uL — ABNORMAL LOW (ref 0.7–4.0)
MCH: 29.2 pg (ref 26.0–34.0)
MCHC: 33.1 g/dL (ref 30.0–36.0)
MCV: 88.2 fL (ref 78.0–100.0)
Monocytes Absolute: 0.1 10*3/uL (ref 0.1–1.0)
Monocytes Relative: 6 %
NEUTROS PCT: 88 %
Neutro Abs: 2.2 10*3/uL (ref 1.7–7.7)
Platelets: 164 10*3/uL (ref 150–400)
RBC: 3.22 MIL/uL — ABNORMAL LOW (ref 3.87–5.11)
RDW: 19.4 % — ABNORMAL HIGH (ref 11.5–15.5)
WBC: 2.4 10*3/uL — ABNORMAL LOW (ref 4.0–10.5)

## 2015-05-12 LAB — COMPREHENSIVE METABOLIC PANEL
ALBUMIN: 3.3 g/dL — AB (ref 3.5–5.0)
ALT: 31 U/L (ref 14–54)
ANION GAP: 12 (ref 5–15)
AST: 46 U/L — ABNORMAL HIGH (ref 15–41)
Alkaline Phosphatase: 80 U/L (ref 38–126)
BILIRUBIN TOTAL: 0.5 mg/dL (ref 0.3–1.2)
BUN: 51 mg/dL — ABNORMAL HIGH (ref 6–20)
CO2: 21 mmol/L — ABNORMAL LOW (ref 22–32)
Calcium: 8.8 mg/dL — ABNORMAL LOW (ref 8.9–10.3)
Chloride: 101 mmol/L (ref 101–111)
Creatinine, Ser: 2.29 mg/dL — ABNORMAL HIGH (ref 0.44–1.00)
GFR calc non Af Amer: 21 mL/min — ABNORMAL LOW (ref >60)
GFR, EST AFRICAN AMERICAN: 24 mL/min — AB (ref >60)
GLUCOSE: 203 mg/dL — AB (ref 65–99)
POTASSIUM: 3.9 mmol/L (ref 3.5–5.1)
Sodium: 134 mmol/L — ABNORMAL LOW (ref 135–145)
TOTAL PROTEIN: 6.9 g/dL (ref 6.5–8.1)

## 2015-05-12 LAB — URINE MICROSCOPIC-ADD ON

## 2015-05-12 LAB — I-STAT CG4 LACTIC ACID, ED: LACTIC ACID, VENOUS: 3.09 mmol/L — AB (ref 0.5–2.0)

## 2015-05-12 LAB — GLUCOSE, CAPILLARY: Glucose-Capillary: 132 mg/dL — ABNORMAL HIGH (ref 65–99)

## 2015-05-12 LAB — BRAIN NATRIURETIC PEPTIDE: B NATRIURETIC PEPTIDE 5: 160.5 pg/mL — AB (ref 0.0–100.0)

## 2015-05-12 MED ORDER — SODIUM CHLORIDE 0.9 % IV BOLUS (SEPSIS)
1000.0000 mL | Freq: Once | INTRAVENOUS | Status: AC
  Administered 2015-05-12: 1000 mL via INTRAVENOUS

## 2015-05-12 MED ORDER — ACETAMINOPHEN 325 MG PO TABS
650.0000 mg | ORAL_TABLET | Freq: Four times a day (QID) | ORAL | Status: DC | PRN
  Administered 2015-05-12: 650 mg via ORAL
  Filled 2015-05-12: qty 2

## 2015-05-12 MED ORDER — ACETAMINOPHEN 650 MG RE SUPP: 650.0000 mg | Freq: Four times a day (QID) | RECTAL | Status: DC | PRN

## 2015-05-12 MED ORDER — SODIUM CHLORIDE 0.9 % IV BOLUS (SEPSIS): 1000.0000 mL | Freq: Once | INTRAVENOUS | Status: DC

## 2015-05-12 MED ORDER — ISOSORBIDE MONONITRATE ER 60 MG PO TB24
60.0000 mg | ORAL_TABLET | Freq: Every day | ORAL | Status: DC
  Administered 2015-05-13 – 2015-05-15 (×3): 60 mg via ORAL
  Filled 2015-05-12 (×3): qty 1

## 2015-05-12 MED ORDER — SODIUM CHLORIDE 0.9 % IJ SOLN
3.0000 mL | Freq: Two times a day (BID) | INTRAMUSCULAR | Status: DC
  Administered 2015-05-13 – 2015-05-15 (×5): 3 mL via INTRAVENOUS

## 2015-05-12 MED ORDER — ALBUTEROL SULFATE (2.5 MG/3ML) 0.083% IN NEBU: 3.0000 mL | INHALATION_SOLUTION | Freq: Four times a day (QID) | RESPIRATORY_TRACT | Status: DC | PRN

## 2015-05-12 MED ORDER — HEPARIN SODIUM (PORCINE) 5000 UNIT/ML IJ SOLN
5000.0000 [IU] | Freq: Three times a day (TID) | INTRAMUSCULAR | Status: DC
Start: 2015-05-12 — End: 2015-05-15
  Administered 2015-05-12 – 2015-05-14 (×2): 5000 [IU] via SUBCUTANEOUS
  Filled 2015-05-12 (×6): qty 1

## 2015-05-12 MED ORDER — TORSEMIDE 20 MG PO TABS
40.0000 mg | ORAL_TABLET | Freq: Two times a day (BID) | ORAL | Status: DC
  Administered 2015-05-13 – 2015-05-15 (×6): 40 mg via ORAL
  Filled 2015-05-12 (×6): qty 2

## 2015-05-12 MED ORDER — ATORVASTATIN CALCIUM 40 MG PO TABS
40.0000 mg | ORAL_TABLET | Freq: Every day | ORAL | Status: DC
  Administered 2015-05-13 – 2015-05-15 (×3): 40 mg via ORAL
  Filled 2015-05-12 (×3): qty 1

## 2015-05-12 MED ORDER — POTASSIUM CHLORIDE CRYS ER 20 MEQ PO TBCR
40.0000 meq | EXTENDED_RELEASE_TABLET | Freq: Two times a day (BID) | ORAL | Status: DC
  Administered 2015-05-12 – 2015-05-15 (×6): 40 meq via ORAL
  Filled 2015-05-12 (×6): qty 2

## 2015-05-12 MED ORDER — ONDANSETRON HCL 4 MG PO TABS: 4.0000 mg | ORAL_TABLET | Freq: Four times a day (QID) | ORAL | Status: DC | PRN

## 2015-05-12 MED ORDER — NITROGLYCERIN 0.4 MG SL SUBL: 0.4000 mg | SUBLINGUAL_TABLET | SUBLINGUAL | Status: DC | PRN

## 2015-05-12 MED ORDER — INSULIN ASPART 100 UNIT/ML ~~LOC~~ SOLN
0.0000 [IU] | Freq: Three times a day (TID) | SUBCUTANEOUS | Status: DC
  Administered 2015-05-13 (×3): 2 [IU] via SUBCUTANEOUS
  Administered 2015-05-14 (×2): 3 [IU] via SUBCUTANEOUS
  Administered 2015-05-14: 2 [IU] via SUBCUTANEOUS
  Administered 2015-05-15 (×3): 3 [IU] via SUBCUTANEOUS

## 2015-05-12 MED ORDER — PIPERACILLIN-TAZOBACTAM 3.375 G IVPB 30 MIN
3.3750 g | Freq: Once | INTRAVENOUS | Status: AC
  Administered 2015-05-12: 3.375 g via INTRAVENOUS
  Filled 2015-05-12: qty 50

## 2015-05-12 MED ORDER — SODIUM CHLORIDE 0.9 % IJ SOLN
3.0000 mL | INTRAMUSCULAR | Status: DC | PRN
Start: 2015-05-12 — End: 2015-05-15

## 2015-05-12 MED ORDER — LETROZOLE 2.5 MG PO TABS
2.5000 mg | ORAL_TABLET | Freq: Every day | ORAL | Status: DC
  Administered 2015-05-13 – 2015-05-15 (×3): 2.5 mg via ORAL
  Filled 2015-05-12 (×3): qty 1

## 2015-05-12 MED ORDER — SODIUM CHLORIDE 0.9 % IV SOLN
250.0000 mL | INTRAVENOUS | Status: DC | PRN
  Administered 2015-05-13: 250 mL via INTRAVENOUS

## 2015-05-12 MED ORDER — VANCOMYCIN HCL 10 G IV SOLR
2000.0000 mg | Freq: Once | INTRAVENOUS | Status: AC
  Administered 2015-05-12: 2000 mg via INTRAVENOUS
  Filled 2015-05-12: qty 2000

## 2015-05-12 MED ORDER — MIRTAZAPINE 15 MG PO TABS
15.0000 mg | ORAL_TABLET | Freq: Every day | ORAL | Status: DC
  Administered 2015-05-12 – 2015-05-14 (×3): 15 mg via ORAL
  Filled 2015-05-12 (×3): qty 1

## 2015-05-12 MED ORDER — INSULIN ASPART 100 UNIT/ML ~~LOC~~ SOLN: 0.0000 [IU] | Freq: Every day | SUBCUTANEOUS | Status: DC

## 2015-05-12 MED ORDER — DOCUSATE SODIUM 100 MG PO CAPS
100.0000 mg | ORAL_CAPSULE | Freq: Every day | ORAL | Status: DC
  Administered 2015-05-13: 100 mg via ORAL
  Filled 2015-05-12: qty 1

## 2015-05-12 MED ORDER — PIPERACILLIN-TAZOBACTAM 3.375 G IVPB
3.3750 g | Freq: Three times a day (TID) | INTRAVENOUS | Status: DC
  Administered 2015-05-12 – 2015-05-14 (×5): 3.375 g via INTRAVENOUS
  Filled 2015-05-12 (×10): qty 50

## 2015-05-12 MED ORDER — SODIUM CHLORIDE 0.9 % IJ SOLN: 3.0000 mL | Freq: Two times a day (BID) | INTRAMUSCULAR | Status: DC

## 2015-05-12 MED ORDER — ASPIRIN EC 81 MG PO TBEC
81.0000 mg | DELAYED_RELEASE_TABLET | Freq: Every day | ORAL | Status: DC
  Administered 2015-05-13 – 2015-05-15 (×3): 81 mg via ORAL
  Filled 2015-05-12 (×3): qty 1

## 2015-05-12 MED ORDER — VANCOMYCIN HCL 10 G IV SOLR
1500.0000 mg | INTRAVENOUS | Status: DC
  Filled 2015-05-12: qty 1500

## 2015-05-12 MED ORDER — ONDANSETRON HCL 4 MG/2ML IJ SOLN
4.0000 mg | Freq: Four times a day (QID) | INTRAMUSCULAR | Status: DC | PRN
  Administered 2015-05-15: 4 mg via INTRAVENOUS
  Filled 2015-05-12: qty 2

## 2015-05-12 NOTE — ED Notes (Signed)
Pt states she was standing outsde waiting on a Van to pick her up and she became very hot and short of breath and started to fall when the driver of the [employer] arrived helped lowered pt to the ground. Pt denies LOC. Denies any pain at this time. Pt is alert and ox4. Labored breathing is noted and pt appears lethargic but will answer questions and follows commands.

## 2015-05-12 NOTE — ED Notes (Signed)
Pt transported to xray 

## 2015-05-12 NOTE — ED Notes (Signed)
MD at bedside; admitting

## 2015-05-12 NOTE — H&P (Signed)
[facility] Admission History and Physical Service Pager: [phone]  Patient name: Anita [name] Medical record number: [id number] Date of birth: 1946/08/15 Age: 69 y.o. Gender: female  Primary Care Provider: [name], DO Consultants: none Code Status: Full (confirmed on admission)  Chief Complaint: weakness  Assessment and Plan: Anita [name] is a 69 y.o. female presenting with weakness secondary to sepsis. PMH is significant for breast cancer with known spinal metastasis, COPD, type 2 diabetes, HLD, CAD, CKD stage III, chronic diastolic heart failure  # Sepsis secondary to RLE cellulitis/open wound and/or UTI: Sepsis by temp, tachy, WBC. QSOFA score 2. Chronic wound x 1 month after fall and has been followed by wound care clinic. Worsened x 1 day with new warmth/redness of the area. Fever to 103F on admission, SBP staying in 100-130s. WBC 2.4, she generally runs on low end since 2015 (?chemo or breast ca meds). i-stat Lactic acid 3.09. CXR without evidence of pneumonia. UA also appears infected with positive nitrites and large leuks, does not endorse symptoms. Some confusion in ED but overall mentation seems stable. Started on vanc and zosyn in ED.  - telemetry - continue vancomycin (9/15>>) - continue zosyn (9/15>>) - blood and urine cultures obtained - hold on fluids as BP stable (known CHF) - gentle boluses as needed - wound care consult - trend lactate per protocol, serum in AM  # AKI on CKD: SCr 2.29 (baseline around 1.6-1.7) - trend BMP  # COPD: stable, on 2L O2 in ED with SpO2 100%, no home O2. - supplemental O2 as needed - continue home albuterol and symbicort  # T2DM: stable, last a1c 6.6 in August 2016 - SSI - CBG monitoring qAC qHS  # Breast and probable laryngeal cancer: multiple bony mets, per most recent onc note this is mostly clinically stable and was going to restart ibrance on 9/19 pending wound healing of RLE. - hold  palbociclib  - continue letrozole (appears to be non-bone marrow suppressing)  - on remeron for appetite  # HFpEF/CHF: BNP 160.5, does not appear to be overtly fluid overloaded. Last echo 01/2014 EF 55% - continue torsemide 46m BID (home dose 823mAM, 4058mM) - continue imdur 44m35mily  # HLD: - continue atorvastatin  FEN/GI: HH/carb modified diet / saline lock Prophylaxis: heparin sq  Disposition: admit to inpatient  History of Present Illness:  Anita [name] 69 y78. female presenting with weakness. She says the symptoms started yesterday rather suddenly. She denies passing out. She endorses feeling short of breath over past day. Daughter at bedside reports that she was feeling well even until 11pm yesterday (it was patient's birthday and family was over looking at photos and talking), and feels symptoms started this morning. She was at [employer] trying to get on the bus when she felt tired and had to sit down at which point EMS was called. She denies chest pain, fevers, chills, runny nose/sore throat, rashes, abdominal pain. She denies any pain while voiding, no urinary frequency or urgency; daughter says urine has had more odor recently (she uses bedside commode). At baseline she is able to walk around the house. She is on oxygen at night.   Review Of Systems: Per HPI with the following additions: none Otherwise 12 point review of systems was performed and was unremarkable.  Patient Active Problem List   Diagnosis Date Noted  . Severe sepsis 05/12/2015  . Sepsis 05/12/2015  . Laceration   .  Absolute anemia   . CAD in native artery   . Venous stasis ulcer   . COLD (chronic obstructive lung disease)   . Leg laceration 04/04/2015  . Laceration of lower extremity 04/04/2015  . Venous stasis of lower extremity 12/05/2014  . Intertrigo 02/19/2014  . 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 Hyperlipidemia 02/11/2013  . Breast cancer metastasized to bone 10/14/2012  . Chronic  respiratory failure 10/06/2012  . Chronic diastolic heart failure 55/73/2202  . CAD (coronary artery disease) 10/06/2012  . Chronic kidney disease, stage III (moderate) 10/06/2012  . Anemia of chronic disease 10/06/2012  . Breast mass, right 10/06/2012  . History of tobacco abuse 10/06/2012  . COPD (chronic obstructive pulmonary disease) 09/27/2012  . DM2 (diabetes mellitus, type 2) 09/26/2012   Past Medical History: Past Medical History  Diagnosis Date  . Fibroid   . Morbid obesity   . 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 COPD (chronic obstructive pulmonary disease)   . Chronic diastolic CHF (congestive heart failure)   . NSTEMI (non-ST elevated myocardial infarction)     Cath November 2014 LAD 40% stenosis, first diagonal 80% stenosis, circumflex 20% stenosis, right coronary artery occluded. The EF was 35-40% at that time.  . Anemia     a. Adm 09/2012 with melena, Hgb 5.8 -> transfused. EGD/colonoscopy unrevealing.  . Breast cancer     a. Mets to bone. ER 100%/ PR 0%/Her2 neu negative.  [name] Ulcers of both lower legs   . Tobacco abuse   . Hyperlipidemia 02/11/2013  . Chronic respiratory failure     a. On O2 qhs. also portable O2.  . Chronic renal insufficiency   . Lesion of vocal cord     a. CT 05/2013 concerning for tumor.  . Shortness of breath   . Depression   . Diabetes mellitus without complication   . Anginal pain   . Dementia     very mild  . Radiation 02/02/14-02/24/14    left and right femur 30 gray   Past Surgical History: Past Surgical History  Procedure Laterality Date  . Colonoscopy, esophagogastroduodenoscopy (egd) and esophageal dilation N/A 10/13/2012    Procedure: colonoscopy and egd;  Surgeon: Wonda Horner, MD;  Location: [facility];  Service: Endoscopy;  Laterality: N/A;  . Left heart catheterization with coronary angiogram N/A 07/07/2013    Procedure: LEFT HEART CATHETERIZATION WITH CORONARY ANGIOGRAM;  Surgeon:  Peter M Martinique, MD;  Location: [facility] CATH LAB;  Service: Cardiovascular;  Laterality: N/A;   Social History: Social History  Substance Use Topics  . Smoking status: Former Smoker -- 2.00 packs/day for 40 years    Types: Cigarettes    Quit date: 09/24/2012  . Smokeless tobacco: Never Used  . Alcohol Use: No   Additional social history: lives at home with husband, has home aid that comes in 5 days a week. Goes to [facility] for psychology.  Please also refer to relevant sections of EMR.  Family History: Family History  Problem Relation Age of Onset  . Cancer Mother     leukemia  . Hypertension Mother   . Diabetes Father   . Heart disease Father     passed away due to heart attack   Allergies and Medications: Allergies  Allergen Reactions  . Omnipaque [Iohexol] Hives and Other (See Comments)    Pt claims she developed hives after given contrast  . Benzene Rash   No current  facility-administered medications on file prior to encounter.   Current Outpatient Prescriptions on File Prior to Encounter  Medication Sig Dispense Refill  . albuterol (PROVENTIL HFA;VENTOLIN HFA) 108 (90 BASE) MCG/ACT inhaler Inhale 2 puffs into the lungs every 6 (six) hours as needed for wheezing or shortness of breath. 3 Inhaler 3  . aspirin EC 81 MG EC tablet Take 1 tablet (81 mg total) by mouth daily. 30 tablet 3  . atorvastatin (LIPITOR) 40 MG tablet Take 40 mg by mouth daily.    . bag balm OINT ointment Apply 1 g topically as needed for dry skin or irritation. 300 g 11  . clotrimazole (LOTRIMIN) 1 % cream Apply 1 application topically daily. 113 g 3  . docusate sodium (COLACE) 100 MG capsule Take 100 mg by mouth daily.    . isosorbide mononitrate (IMDUR) 60 MG 24 hr tablet Take 1 tablet (60 mg total) by mouth daily. 90 tablet 3  . letrozole (FEMARA) 2.5 MG tablet Take 1 tablet (2.5 mg total) by mouth daily. 90 tablet 2  . mirtazapine (REMERON) 15 MG tablet Take 1 tablet (15 mg total) by mouth at bedtime. 30  tablet 5  . nitroGLYCERIN (NITROSTAT) 0.4 MG SL tablet Place 1 tablet (0.4 mg total) under the tongue every 5 (five) minutes as needed for chest pain. 30 tablet 0  . potassium chloride SA (K-DUR,KLOR-CON) 20 MEQ tablet Take 2 tablets (40 mEq total) by mouth 2 (two) times daily. 120 tablet 6  . VITAMIN D, CHOLECALCIFEROL, PO Take 1,000 mg by mouth daily.     . NON FORMULARY Place 3 L into the nose as needed (oxygen).     . palbociclib (IBRANCE) 125 MG capsule Take 1 capsule (125 mg total) by mouth See admin instructions. Take 137m every morning for 2 weeks on,  1 week off . (Patient not taking: Reported on 05/10/2015) 14 capsule 2  . torsemide (DEMADEX) 20 MG tablet TAKE 80  MG IN THE MORNING AND 40 MG AT NIGHT. (Patient not taking: Reported on 05/12/2015) 360 tablet 5    Objective: BP 130/46 mmHg  Pulse 103  Temp(Src) 102.9 F (39.4 C) (Oral)  Resp 20  Ht _0  (1.626 m)  Wt 228 lb (103.42 kg)  BMI 39.12 kg/m2  SpO2 100% Exam: General: NAD Eyes: PERRL, EOMI ENTM: oral mucosa dry, no lesions. Neck: no JVD appreciated, supple Cardiovascular: tachycardic, regular rhythm, normal s1s2, no murmur appreciated. 2+ radial pulses. 1-2+ edema in LE Respiratory: mild expiratory wheezes throughout, normal effort Abdomen: obese, soft, nontender, bowel sounds present MSK: normal bulk/tone, no deformities (see below) Skin: open wounds RLE with erythema and warmth circumferentially around calf, no major drainage or weeping Neuro: alert, no focal deficits. Perseverating/repeating requests for water. Psych: normal thought content and speech. Affect appears normal. RLE picture:     Labs and Imaging: CBC BMET   Recent Labs Lab 05/12/15 1638  WBC 2.4*  HGB 9.4*  HCT 28.4*  PLT 164    Recent Labs Lab 05/12/15 1638  NA 134*  K 3.9  CL 101  CO2 21*  BUN 51*  CREATININE 2.29*  GLUCOSE 203*  CALCIUM 8.8*      KSela Hua MD 05/12/2015, 8:45 PM PGY-1, [facility] pager: [phone] text pages welcome  I have seen and examined the patient. I have read and agree with the above note. My changes are noted in blue.  ATawanna Sat MD 05/12/2015, 9:33 PM PGY-3, [facility]  [place] Intern Pager: [phone], text pages welcome

## 2015-05-12 NOTE — ED Provider Notes (Signed)
CSN: [id number]     Arrival date & time 05/12/15  1624 History   First MD Initiated Contact with Patient 05/12/15 1625     Chief Complaint  Patient presents with  . Near Syncope     (Consider location/radiation/quality/duration/timing/severity/associated sxs/prior Treatment) Patient is a 69 y.o. female presenting with fever.  Fever Max temp prior to arrival:  Unknown Temp source:  Subjective Severity:  Severe Onset quality:  Gradual Duration:  1 day Timing:  Constant Progression:  Worsening Chronicity:  New Relieved by:  None tried Worsened by:  Nothing tried Ineffective treatments:  None tried Associated symptoms: cough and somnolence   Associated symptoms: no diarrhea, no nausea, no rhinorrhea and no vomiting   Associated symptoms comment:  Sob   Past Medical History  Diagnosis Date  . Fibroid   . Morbid obesity   . 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 COPD (chronic obstructive pulmonary disease)   . Chronic diastolic CHF (congestive heart failure)   . NSTEMI (non-ST elevated myocardial infarction)     Cath November 2014 LAD 40% stenosis, first diagonal 80% stenosis, circumflex 20% stenosis, right coronary artery occluded. The EF was 35-40% at that time.  . Anemia     a. Adm 09/2012 with melena, Hgb 5.8 -> transfused. EGD/colonoscopy unrevealing.  . Breast cancer     a. Mets to bone. ER 100%/ PR 0%/Her2 neu negative.  [name] Ulcers of both lower legs   . Tobacco abuse   . Hyperlipidemia 02/11/2013  . Chronic respiratory failure     a. On O2 qhs. also portable O2.  . Chronic renal insufficiency   . Lesion of vocal cord     a. CT 05/2013 concerning for tumor.  . Shortness of breath   . Depression   . Diabetes mellitus without complication   . Anginal pain   . Dementia     very mild  . Radiation 02/02/14-02/24/14    left and right femur 30 gray   Past Surgical History  Procedure Laterality Date  . Colonoscopy,  esophagogastroduodenoscopy (egd) and esophageal dilation N/A 10/13/2012    Procedure: colonoscopy and egd;  Surgeon: Wonda Horner, MD;  Location: [facility];  Service: Endoscopy;  Laterality: N/A;  . Left heart catheterization with coronary angiogram N/A 07/07/2013    Procedure: LEFT HEART CATHETERIZATION WITH CORONARY ANGIOGRAM;  Surgeon: Peter M Martinique, MD;  [facility] C[facility] LAB;  Service: Cardiovascular;  Laterality: N/A;   Family History  Problem Relation Age of Onset  . Cancer Mother     leukemia  . Hypertension Mother   . Diabetes Father   . Heart disease Father     passed away due to heart attack   Social History  Substance Use Topics  . Smoking status: Former Smoker -- 2.00 packs/day for 40 years    Types: Cigarettes    Quit date: 09/24/2012  . Smokeless tobacco: Never Used  . Alcohol Use: No   OB History    Gravida Para Term Preterm AB TAB SAB Ectopic Multiple Living   '2 2 2       2     '$ Review of Systems  Unable to perform ROS: Acuity of condition  Constitutional: Positive for fever.  HENT: Negative for rhinorrhea.   Respiratory: Positive for cough.   Gastrointestinal: Negative for nausea, vomiting and diarrhea.      Allergies  Omnipaque and Benzene  Home Medications   Prior  to Admission medications   Medication Sig Start Date End Date Taking? Authorizing Provider  albuterol (PROVENTIL HFA;VENTOLIN HFA) 108 (90 BASE) MCG/ACT inhaler Inhale 2 puffs into the lungs every 6 (six) hours as needed for wheezing or shortness of breath. 12/08/14  Yes Chesley Mires, MD  Amino Acids-Protein Hydrolys (FEEDING SUPPLEMENT, PRO-STAT SUGAR FREE 64,) LIQD Take 30 mLs by mouth 3 (three) times daily with meals.   Yes Historical Provider, MD  Ascorbic Acid (VITAMIN C PO) Take 1 tablet by mouth daily.   Yes Historical Provider, MD  aspirin EC 81 MG EC tablet Take 1 tablet (81 mg total) by mouth daily. 10/02/12  Yes Leone Haven, MD  atorvastatin (LIPITOR) 40 MG tablet Take 40  mg by mouth daily.   Yes Historical Provider, MD  bag balm OINT ointment Apply 1 g topically as needed for dry skin or irritation. 12/03/14  Yes Coral Spikes, DO  clotrimazole (LOTRIMIN) 1 % cream Apply 1 application topically daily. 11/26/14  Yes Coral Spikes, DO  docusate sodium (COLACE) 100 MG capsule Take 100 mg by mouth daily.   Yes Historical Provider, MD  isosorbide mononitrate (IMDUR) 60 MG 24 hr tablet Take 1 tablet (60 mg total) by mouth daily. 04/12/15  Yes Nicolette Bang, DO  letrozole [facility]) 2.5 MG tablet Take 1 tablet (2.5 mg total) by mouth daily. 05/10/15  Yes Truitt Merle, MD  mirtazapine (REMERON) 15 MG tablet Take 1 tablet (15 mg total) by mouth at bedtime. 12/17/14  Yes Truitt Merle, MD  nitroGLYCERIN (NITROSTAT) 0.4 MG SL tablet Place 1 tablet (0.4 mg total) under the tongue every 5 (five) minutes as needed for chest pain. 03/17/13  Yes Mariel Aloe, MD  potassium chloride SA (K-DUR,KLOR-CON) 20 MEQ tablet Take 2 tablets (40 mEq total) by mouth 2 (two) times daily. 04/04/15  Yes Nicolette Bang, DO  torsemide (DEMADEX) 20 MG tablet Take 20-80 mg by mouth See admin instructions. Daughter states she takes 80mg  in the morning, then take 40mg  in the afternoon.   Yes Historical Provider, MD  VITAMIN D, CHOLECALCIFEROL, PO Take 1,000 mg by mouth daily.    Yes Historical Provider, MD  NON FORMULARY Place 3 L into the nose as needed (oxygen).     Historical Provider, MD  palbociclib [facility]) 125 MG capsule Take 1 capsule (125 mg total) by mouth See admin instructions. Take 125mg  every morning for 2 weeks on,  1 week off . Patient not taking: Reported on 05/10/2015 03/23/15   Truitt Merle, MD  torsemide (DEMADEX) 20 MG tablet TAKE 80  MG IN THE MORNING AND 40 MG AT NIGHT. Patient not taking: Reported on 05/12/2015 04/12/15   Nicolette Bang, DO   BP 113/93 mmHg  Pulse 119  Temp(Src) 103 F (39.4 C) (Oral)  Resp 25  Ht 5\' 4"  (1.626 m)  Wt 228 lb (103.42 kg)  BMI 39.12 kg/m2   SpO2 96% Physical Exam  Constitutional: She is oriented to person, place, and time. She appears well-developed and well-nourished. She appears lethargic. She appears distressed.  HENT:  Head: Normocephalic and atraumatic.  Eyes: EOM are normal.  Neck: Normal range of motion.  Cardiovascular: Regular rhythm and normal heart sounds.  Tachycardia present.   No murmur heard. Pulmonary/Chest: Effort normal and breath sounds normal. No respiratory distress. She has no wheezes.  Abdominal: Soft. There is no tenderness.  Musculoskeletal: She exhibits no edema.  Neurological: She is oriented to person, place, and time. She  appears lethargic.  Skin: She is not diaphoretic.  Psychiatric: She has a normal mood and affect. Her behavior is normal.    ED Course  Procedures (including critical care time) Labs Review Labs Reviewed  CBC WITH DIFFERENTIAL/PLATELET - Abnormal; Notable for the following:    WBC 2.4 (*)    RBC 3.22 (*)    Hemoglobin 9.4 (*)    HCT 28.4 (*)    RDW 19.4 (*)    Lymphs Abs 0.1 (*)    All other components within normal limits  COMPREHENSIVE METABOLIC PANEL - Abnormal; Notable for the following:    Sodium 134 (*)    CO2 21 (*)    Glucose, Bld 203 (*)    BUN 51 (*)    Creatinine, Ser 2.29 (*)    Calcium 8.8 (*)    Albumin 3.3 (*)    AST 46 (*)    GFR calc non Af Amer 21 (*)    GFR calc Af Amer 24 (*)    All other components within normal limits  BRAIN NATRIURETIC PEPTIDE - Abnormal; Notable for the following:    B Natriuretic Peptide 160.5 (*)    All other components within normal limits  I-STAT CG4 LACTIC ACID, ED - Abnormal; Notable for the following:    Lactic Acid, Venous 3.09 (*)    All other components within normal limits  URINE CULTURE  CULTURE, BLOOD (ROUTINE X 2)  CULTURE, BLOOD (ROUTINE X 2)  URINALYSIS, ROUTINE W REFLEX MICROSCOPIC (NOT AT [facility])  CBG MONITORING, ED    Imaging Review Dg Chest 2 View  05/12/2015   CLINICAL DATA:  Shortness of  breath. Syncopal episode. History of breast carcinoma  EXAM: CHEST  2 VIEW  COMPARISON:  April 04, 2015 chest radiograph and chest CT May 13, 2013  FINDINGS: There is no edema or consolidation. Heart size and pulmonary vascularity are within normal limits. No adenopathy. Widespread bony metastatic disease noted on prior CT. There are healed fractures of the left sixth and seventh ribs with the seventh rib showing sclerotic change consistent with metastasis. There is a lytic appearing lesion in the lateral right clavicle. There is a more subtle lytic lesion in the proximal left humerus.  IMPRESSION: No edema or consolidation. Bony metastases. Given the chronicity of the bony changes compared to prior CT, it may be reasonable to correlate with nuclear medicine bone scan at this time to assess for degree of metabolic activity of the apparent bony metastases. A lesion in the lateral right clavicle appears lytic and may have progressed from 2014 in particular.   Electronically Signed   By: Lowella Grip III M.D.   On: 05/12/2015 17:23   I have personally reviewed and evaluated these images and lab results as part of my medical decision-making.   EKG Interpretation None      MDM   Final diagnoses:  Severe sepsis     Patient is a 69 year old female with a history of stage III laryngeal cancer, metastatic breast cancer, CHF, COPD, chronic wound to the right lower extremity that presents with fever and shortness of breath after near-syncopal episode earlier today. On arrival to the ED the patient is febrile to 103 with a pulse of 120 in complaining of shortness of breath on her home 2 L of oxygen. Patient's wound according to her health care worker is not worse than normal. Patient has no focal lung sounds. We will initiate a code sepsis and provide IV antibiotics and fluid resuscitation.  Due to the patient's congestive heart failure we will start 1 L and reassess for concern of possible pulmonary  edema if we cause volume overload.  Patient's chest x-ray shows no obvious consolidation. Patient will be admitted to family medicine for further management of severe sepsis. Patient's lactate was 3.09. Patient also has an AK I.  Renne Musca, MD 05/12/15 1801  Leo Grosser, MD 05/13/15 [phone]

## 2015-05-12 NOTE — Progress Notes (Signed)
ANTIBIOTIC CONSULT NOTE - INITIAL  Pharmacy Consult for Vancomycin and Zosyn Indication: rule out sepsis  Allergies  Allergen Reactions  . Omnipaque [Iohexol] Hives and Other (See Comments)    Pt claims she developed hives after given contrast  . Benzene Rash    Patient Measurements: Height: 5' 4" (162.6 cm) Weight: 228 lb (103.42 kg) IBW/kg (Calculated) : 54.7  Vital Signs: Temp: 102.9 F (39.4 C) (09/15 1951) Temp Source: Oral (09/15 1951) BP: 130/46 mmHg (09/15 1951) Pulse Rate: 103 (09/15 1951) Intake/Output from previous day:   Intake/Output from this shift:    Labs:  Recent Labs  05/10/15 0918 05/12/15 1638  WBC 2.5* 2.4*  HGB 9.0* 9.4*  PLT 167 164  CREATININE 1.8* 2.29*   Estimated Creatinine Clearance: 27.5 mL/min (by C-G formula based on Cr of 2.29). No results for input(s): VANCOTROUGH, VANCOPEAK, VANCORANDOM, GENTTROUGH, GENTPEAK, GENTRANDOM, TOBRATROUGH, TOBRAPEAK, TOBRARND, AMIKACINPEAK, AMIKACINTROU, AMIKACIN in the last 72 hours.   Microbiology: No results found for this or any previous visit (from the past 720 hour(s)).  Medical History: Past Medical History  Diagnosis Date  . Fibroid   . Morbid obesity   . 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 COPD (chronic obstructive pulmonary disease)   . Chronic diastolic CHF (congestive heart failure)   . NSTEMI (non-ST elevated myocardial infarction)     Cath November 2014 LAD 40% stenosis, first diagonal 80% stenosis, circumflex 20% stenosis, right coronary artery occluded. The EF was 35-40% at that time.  . Anemia     a. Adm 09/2012 with melena, Hgb 5.8 -> transfused. EGD/colonoscopy unrevealing.  . Breast cancer     a. Mets to bone. ER 100%/ PR 0%/Her2 neu negative.  [name] Ulcers of both lower legs   . Tobacco abuse   . Hyperlipidemia 02/11/2013  . Chronic respiratory failure     a. On O2 qhs. also portable O2.  . Chronic renal insufficiency   . Lesion of  vocal cord     a. CT 05/2013 concerning for tumor.  . Shortness of breath   . Depression   . Diabetes mellitus without complication   . Anginal pain   . Dementia     very mild  . Radiation 02/02/14-02/24/14    left and right femur 30 gray    Medications:  Prescriptions prior to admission  Medication Sig Dispense Refill Last Dose  . albuterol (PROVENTIL HFA;VENTOLIN HFA) 108 (90 BASE) MCG/ACT inhaler Inhale 2 puffs into the lungs every 6 (six) hours as needed for wheezing or shortness of breath. 3 Inhaler 3 Past Month at Unknown time  . Amino Acids-Protein Hydrolys (FEEDING SUPPLEMENT, PRO-STAT SUGAR FREE 64,) LIQD Take 30 mLs by mouth 3 (three) times daily with meals.   05/12/2015 at Unknown time  . Ascorbic Acid (VITAMIN C PO) Take 1 tablet by mouth daily.   05/12/2015 at Unknown time  . aspirin EC 81 MG EC tablet Take 1 tablet (81 mg total) by mouth daily. 30 tablet 3 05/12/2015 at Unknown time  . atorvastatin (LIPITOR) 40 MG tablet Take 40 mg by mouth daily.   05/12/2015 at Unknown time  . bag balm OINT ointment Apply 1 g topically as needed for dry skin or irritation. 300 g 11 05/12/2015 at Unknown time  . clotrimazole (LOTRIMIN) 1 % cream Apply 1 application topically daily. 113 g 3 Past Week at Unknown time  . docusate sodium (COLACE) 100 MG capsule Take  100 mg by mouth daily.   05/12/2015 at Unknown time  . isosorbide mononitrate (IMDUR) 60 MG 24 hr tablet Take 1 tablet (60 mg total) by mouth daily. 90 tablet 3 05/12/2015 at Unknown time  . letrozole (FEMARA) 2.5 MG tablet Take 1 tablet (2.5 mg total) by mouth daily. 90 tablet 2 05/12/2015 at Unknown time  . mirtazapine (REMERON) 15 MG tablet Take 1 tablet (15 mg total) by mouth at bedtime. 30 tablet 5 05/11/2015 at Unknown time  . nitroGLYCERIN (NITROSTAT) 0.4 MG SL tablet Place 1 tablet (0.4 mg total) under the tongue every 5 (five) minutes as needed for chest pain. 30 tablet 0 unknown at Unknown time  . potassium chloride SA (K-DUR,KLOR-CON)  20 MEQ tablet Take 2 tablets (40 mEq total) by mouth 2 (two) times daily. 120 tablet 6 05/12/2015 at Unknown time  . torsemide (DEMADEX) 20 MG tablet Take 20-80 mg by mouth See admin instructions. Daughter states she takes 59m in the morning, then take 441min the afternoon.   05/12/2015 at Unknown time  . VITAMIN D, CHOLECALCIFEROL, PO Take 1,000 mg by mouth daily.    05/12/2015 at Unknown time  . NON FORMULARY Place 3 L into the nose as needed (oxygen).    Taking  . palbociclib (IBRANCE) 125 MG capsule Take 1 capsule (125 mg total) by mouth See admin instructions. Take 12536mvery morning for 2 weeks on,  1 week off . (Patient not taking: Reported on 05/10/2015) 14 capsule 2 Not Taking at Unknown time  . torsemide (DEMADEX) 20 MG tablet TAKE 80  MG IN THE MORNING AND 40 MG AT NIGHT. (Patient not taking: Reported on 05/12/2015) 360 tablet 5 Not Taking at Unknown time   Assessment: [age] F presented to the ED with sepsis secondary to RLE cellulitis and open wound vs UTI.  Tm 103, LA elevated at 3, UA positive, CXR negative PNA.  SCr 2.29, CrCl ~ 30.  Pt received initial Vancomycin and Zosyn doses in the ED at 1800.  PMH: breast cancer with spinal meets, COPD, DM, HLD, CAD, CKD3 (baseline SCr 1.6), HF  9/15 blood x 2 9/15 urine  Goal of Therapy:  Vancomycin trough level 15-20 mcg/ml  Plan:  Vancomycin 1500 mg IV q48h - next dose 9/17 at 1800. Zosyn 3.375 gm IV q8h (4 hour infusion). Follow-up renal function, cx data, and clinical progress.  KimManpower Incharm.D., BCPS Clinical Pharmacist Pager [phone] 9:10 PM

## 2015-05-13 ENCOUNTER — Encounter (HOSPITAL_COMMUNITY): Payer: Self-pay | Admitting: *Deleted

## 2015-05-13 ENCOUNTER — Inpatient Hospital Stay (HOSPITAL_COMMUNITY): Payer: Commercial Managed Care - HMO

## 2015-05-13 DIAGNOSIS — N179 Acute kidney failure, unspecified: Secondary | ICD-10-CM

## 2015-05-13 DIAGNOSIS — S81801A Unspecified open wound, right lower leg, initial encounter: Secondary | ICD-10-CM

## 2015-05-13 DIAGNOSIS — N183 Chronic kidney disease, stage 3 (moderate): Secondary | ICD-10-CM

## 2015-05-13 DIAGNOSIS — A419 Sepsis, unspecified organism: Principal | ICD-10-CM

## 2015-05-13 DIAGNOSIS — L0390 Cellulitis, unspecified: Secondary | ICD-10-CM

## 2015-05-13 LAB — MRSA PCR SCREENING: MRSA BY PCR: NEGATIVE

## 2015-05-13 LAB — C DIFFICILE QUICK SCREEN W PCR REFLEX
C DIFFICILE (CDIFF) INTERP: NEGATIVE
C DIFFICILE (CDIFF) TOXIN: NEGATIVE
C DIFFICLE (CDIFF) ANTIGEN: NEGATIVE

## 2015-05-13 LAB — GLUCOSE, CAPILLARY
GLUCOSE-CAPILLARY: 145 mg/dL — AB (ref 65–99)
GLUCOSE-CAPILLARY: 131 mg/dL — AB (ref 65–99)
Glucose-Capillary: 131 mg/dL — ABNORMAL HIGH (ref 65–99)
Glucose-Capillary: 148 mg/dL — ABNORMAL HIGH (ref 65–99)

## 2015-05-13 LAB — CBC
HEMATOCRIT: 27.2 % — AB (ref 36.0–46.0)
HEMOGLOBIN: 8.5 g/dL — AB (ref 12.0–15.0)
MCH: 28 pg (ref 26.0–34.0)
MCHC: 31.3 g/dL (ref 30.0–36.0)
MCV: 89.5 fL (ref 78.0–100.0)
Platelets: 165 10*3/uL (ref 150–400)
RBC: 3.04 MIL/uL — AB (ref 3.87–5.11)
RDW: 19.7 % — ABNORMAL HIGH (ref 11.5–15.5)
WBC: 2.6 10*3/uL — ABNORMAL LOW (ref 4.0–10.5)

## 2015-05-13 LAB — BASIC METABOLIC PANEL
ANION GAP: 9 (ref 5–15)
BUN: 42 mg/dL — ABNORMAL HIGH (ref 6–20)
CO2: 23 mmol/L (ref 22–32)
Calcium: 8 mg/dL — ABNORMAL LOW (ref 8.9–10.3)
Chloride: 109 mmol/L (ref 101–111)
Creatinine, Ser: 2.17 mg/dL — ABNORMAL HIGH (ref 0.44–1.00)
GFR, EST AFRICAN AMERICAN: 26 mL/min — AB (ref >60)
GFR, EST NON AFRICAN AMERICAN: 22 mL/min — AB (ref >60)
GLUCOSE: 165 mg/dL — AB (ref 65–99)
POTASSIUM: 3.5 mmol/L (ref 3.5–5.1)
Sodium: 141 mmol/L (ref 135–145)

## 2015-05-13 LAB — LACTIC ACID, PLASMA: LACTIC ACID, VENOUS: 2.1 mmol/L — AB (ref 0.5–2.0)

## 2015-05-13 NOTE — Progress Notes (Signed)
I stopped by to meet with patient. She was unavailable to talk at that time. I offered to return later today or tomorrow.  She asked I follow-up tomorrow.  Please call if any needs arise with which we can be of assistance in the meantime.  Micheline Rough, MD [place] Team [phone]

## 2015-05-13 NOTE — Care Management Note (Signed)
Case Management Note  Patient Details  Name: Anita [name] MRN: [id number] Date of Birth: [birth year]  Subjective/Objective:                  Date:9-16 Friday Spoke with patient at the bedside.Introduced self as Tourist information centre manager and explained role in discharge planning and how to be reached. Verified patient lives in [place] in a home with her husband, has DME wheelchair oxygen through [place]. Expressed potential need for  other DME. Verified patient anticipates to go home with spouse,  at time of discharge and will have full-time supervision by spouse and CNA at this time to best of their knowledge. Patient  denied needing help with their medication. Patient takes SCAT to MD appointments. Verified patient has PCP Dr Juleen China at [facility]: CM will continue to follow for discharge planning and [facility] resources.   Carles Collet RN BSN CM [phone]   Action/Plan:   Expected Discharge Date:                  Expected Discharge Plan:  Home/Self Care  In-House Referral:     Discharge planning Services  CM Consult  Post Acute Care Choice:    Choice offered to:     DME Arranged:    DME Agency:     HH Arranged:    HH Agency:     Status of Service:  In process, will continue to follow  Medicare Important Message Given:    Date Medicare IM Given:    Medicare IM give by:    Date Additional Medicare IM Given:    Additional Medicare Important Message give by:     If discussed at [place] of Stay Meetings, dates discussed:    Additional Comments:  Carles Collet, RN 05/13/2015, 3:59 PM

## 2015-05-13 NOTE — Progress Notes (Signed)
Family Medicine Teaching Service Daily Progress Note Intern Pager: [phone]  Patient name: Anita [name] Medical record number: [id number] Date of birth: 1945-11-20 Age: 69 y.o. Gender: female  Primary Care Provider: [name], DO Consultants: Wound Care  Code Status: Full   Pt Overview and Major Events to Date:  9/16: Admitted for sepsis   Assessment and Plan: Anita [name] is a 69 y.o. female presenting with weakness secondary to sepsis. PMH is significant for breast cancer with known spinal metastasis, COPD, type 2 diabetes, HLD, CAD, CKD stage III, chronic diastolic heart failure  # Sepsis secondary to RLE cellulitis/open wound and/or UTI: Sepsis by temp, tachy, WBC. QSOFA score 2. Chronic wound x 1 month after fall and has been followed by wound care clinic. Worsened x 1 day with new warmth/redness of the area. Fever to 103F on admission, SBP staying in 100-130s. WBC 2.4, she generally runs on low end since 2015 (?chemo or breast ca meds). i-stat Lactic acid 3.09. CXR without evidence of pneumonia. UA also appears infected with positive nitrites and large leuks, does not endorse symptoms. Some confusion in ED but overall mentation seems stable. Started on vanc and zosyn in ED.   - telemetry - continue vancomycin (9/15>>) - continue zosyn (9/15>>) - blood and urine cultures pending  - hold on fluids as BP stable (known CHF) - gentle boluses as needed - wound care consult - trend lactate per protocol: 3.09 >> 2.1  -Xray Right Tibia/Fibula: Diffuse soft tissue swelling; no focal bone abnormality   # AKI on CKD: SCr 2.29 (baseline around 1.6-1.7) - trend BMP: Cr 2.17 (9/16)  # COPD: stable on RA at present  - supplemental O2 as needed - continue home albuterol and symbicort  # T2DM: stable, last a1c 6.6 in August 2016 - SSI - CBG monitoring qAC qHS  # Breast and probable laryngeal cancer: multiple bony mets, per most recent onc note this is mostly clinically stable and  was going to restart ibrance on 9/19 pending wound healing of RLE. - hold palbociclib  - continue letrozole (appears to be non-bone marrow suppressing)  - on remeron for appetite  # HFpEF/CHF: BNP 160.5, does not appear to be overtly fluid overloaded. Last echo 01/2014 EF 55% - continue torsemide 40mg  BID (home dose 80mg  AM, 40mg  PM) - continue imdur 60mg  daily  # HLD: - continue atorvastatin  FEN/GI: HH/carb modified diet / saline lock Prophylaxis: heparin sq   Disposition: ? SNF placement pending clinical improvement   Subjective:  Currently afebrile. Denies pain in lower extremities. Comfortable on RA. Denies urinary symptoms.   Objective: Temp:  [98.5 F (36.9 C)-103 F (39.4 C)] 98.5 F (36.9 C) (09/16 0635) Pulse Rate:  [57-119] 85 (09/16 0635) Resp:  [16-31] 16 (09/16 0635) BP: (103-130)/(24-93) 110/46 mmHg (09/16 0635) SpO2:  [95 %-100 %] 98 % (09/16 0635) Weight:  [228 lb (103.42 kg)-232 lb 2.3 oz (105.3 kg)] 232 lb 2.3 oz (105.3 kg) (09/16 0258) Physical Exam: General: lying in bed in NAD  Cardiovascular: RRR. No murmurs appreciated.  Respiratory: Occasional wheezes throughout lung fields bilaterally.  Abdomen: +BS, soft, NTND Extremities: open wounds RLE with improving erythema; increased warmth below wound; no major drainage or weeping  Neuro: A&Ox3 Psych: Normal affect. Responds appropriately to questions.       Laboratory:  Recent Labs Lab 05/10/15 0918 05/12/15 1638  WBC 2.5* 2.4*  HGB 9.0* 9.4*  HCT 28.5* 28.4*  PLT 167 164    Recent  Labs Lab 05/10/15 0918 05/12/15 1638  NA 139 134*  K 3.8 3.9  CL  --  101  CO2 26 21*  BUN 45.2* 51*  CREATININE 1.8* 2.29*  CALCIUM 10.0 8.8*  PROT 6.9 6.9  BILITOT 0.35 0.5  ALKPHOS 96 80  ALT 28 31  AST 25 46*  GLUCOSE 193* 203*    Imaging/Diagnostic Tests: Dg Chest 2 View  05/12/2015   CLINICAL DATA:  Shortness of breath. Syncopal episode. History of breast carcinoma  EXAM: CHEST  2 VIEW   COMPARISON:  April 04, 2015 chest radiograph and chest CT May 13, 2013  FINDINGS: There is no edema or consolidation. Heart size and pulmonary vascularity are within normal limits. No adenopathy. Widespread bony metastatic disease noted on prior CT. There are healed fractures of the left sixth and seventh ribs with the seventh rib showing sclerotic change consistent with metastasis. There is a lytic appearing lesion in the lateral right clavicle. There is a more subtle lytic lesion in the proximal left humerus.  IMPRESSION: No edema or consolidation. Bony metastases. Given the chronicity of the bony changes compared to prior CT, it may be reasonable to correlate with nuclear medicine bone scan at this time to assess for degree of metabolic activity of the apparent bony metastases. A lesion in the lateral right clavicle appears lytic and may have progressed from 2014 in particular.   Electronically Signed   By: Lowella Grip III M.D.   On: 05/12/2015 17:23   Dg Tibia/fibula Right  05/13/2015   CLINICAL DATA:  Right lower leg cellulitis  EXAM: RIGHT TIBIA AND FIBULA - 2 VIEW  COMPARISON:  04/04/2015  FINDINGS: Diffuse soft tissue edema is again noted. The underlying osseous structures appear to be intact. No focal bone erosions identified.  IMPRESSION: 1. Diffuse soft tissue swelling.  No focal bone abnormality.   Electronically Signed   By: Kerby Moors M.D.   On: 05/13/2015 10:15    Nicolette Bang, DO 05/13/2015, 8:46 AM PGY-1, [place] Intern pager: [phone], text pages welcome

## 2015-05-13 NOTE — Consult Note (Addendum)
WOC wound consult note Reason for Consult: Consult requested for right leg chronic wound.  Pt is followed by the outpatient wound care center and they apply silver hydrofiber and Una boots and change once a week. Wound type:  Full thickness Measurement: 5.5X4X.2cm Wound bed: 90% red, 10% yellow Drainage (amount, consistency, odor) Small amt yellow drainage, no odor Periwound: Generalized edema and erythremia to RLE.  Patchy areas of partial thickness skin loss surrounding wound to anterior calf. Dressing procedure/placement/frequency: Continue present plan of care with Aquacel and Una boots which are changed weekly.  Pt can resume follow-up with the outpatient wound care center after discharge. Paged ortho tech to apply right Una boot over Aquacel dressing which was placed. Please re-consult if further assistance is needed.  Thank-you,  Julien Girt MSN, [place], [place], [place], [place]

## 2015-05-13 NOTE — Progress Notes (Signed)
Orthopedic Tech Progress Note Patient Details:  Anita [name] Feb 08, 1946 [id number]  Ortho Devices Type of Ortho Device: Louretta Parma boot Ortho Device/Splint Interventions: Application   Irish Elders 05/13/2015, 3:46 PM

## 2015-05-13 NOTE — Progress Notes (Signed)
Pt c/o pain in R foot/toes "like 1,000 pounds on it" and c/o "dragging" since yesterday. Refused pain medication.

## 2015-05-14 DIAGNOSIS — C50919 Malignant neoplasm of unspecified site of unspecified female breast: Secondary | ICD-10-CM

## 2015-05-14 DIAGNOSIS — R6520 Severe sepsis without septic shock: Secondary | ICD-10-CM

## 2015-05-14 DIAGNOSIS — J410 Simple chronic bronchitis: Secondary | ICD-10-CM

## 2015-05-14 DIAGNOSIS — Z515 Encounter for palliative care: Secondary | ICD-10-CM

## 2015-05-14 DIAGNOSIS — C7951 Secondary malignant neoplasm of bone: Secondary | ICD-10-CM

## 2015-05-14 LAB — CBC
HCT: 26.4 % — ABNORMAL LOW (ref 36.0–46.0)
Hemoglobin: 8.3 g/dL — ABNORMAL LOW (ref 12.0–15.0)
MCH: 27.9 pg (ref 26.0–34.0)
MCHC: 31.4 g/dL (ref 30.0–36.0)
MCV: 88.9 fL (ref 78.0–100.0)
PLATELETS: 162 10*3/uL (ref 150–400)
RBC: 2.97 MIL/uL — AB (ref 3.87–5.11)
RDW: 19.5 % — AB (ref 11.5–15.5)
WBC: 2.9 10*3/uL — AB (ref 4.0–10.5)

## 2015-05-14 LAB — GLUCOSE, CAPILLARY
GLUCOSE-CAPILLARY: 176 mg/dL — AB (ref 65–99)
Glucose-Capillary: 147 mg/dL — ABNORMAL HIGH (ref 65–99)
Glucose-Capillary: 179 mg/dL — ABNORMAL HIGH (ref 65–99)
Glucose-Capillary: 188 mg/dL — ABNORMAL HIGH (ref 65–99)

## 2015-05-14 LAB — BASIC METABOLIC PANEL WITH GFR
Anion gap: 13 (ref 5–15)
BUN: 36 mg/dL — ABNORMAL HIGH (ref 6–20)
CO2: 21 mmol/L — ABNORMAL LOW (ref 22–32)
Calcium: 7.4 mg/dL — ABNORMAL LOW (ref 8.9–10.3)
Chloride: 103 mmol/L (ref 101–111)
Creatinine, Ser: 2.11 mg/dL — ABNORMAL HIGH (ref 0.44–1.00)
GFR calc Af Amer: 27 mL/min — ABNORMAL LOW
GFR calc non Af Amer: 23 mL/min — ABNORMAL LOW
Glucose, Bld: 186 mg/dL — ABNORMAL HIGH (ref 65–99)
Potassium: 3.5 mmol/L (ref 3.5–5.1)
Sodium: 137 mmol/L (ref 135–145)

## 2015-05-14 LAB — LACTIC ACID, PLASMA: LACTIC ACID, VENOUS: 2.5 mmol/L — AB (ref 0.5–2.0)

## 2015-05-14 NOTE — Progress Notes (Signed)
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 Palliative was consulted to review goals of care with patient.  I met with Anita [name] to discuss her current medical problems, recent medical history, and goals of care moving forward. She reports that overall she has been doing well at home other than this acute event that led her to come to the hospital. She is currently enrolled in a program for a degree in psychology and states the classes have been going well.  When I asked what was most important to her moving forward, she reported her family and completing her degree.  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 When I asked her more about her cancer, she does report knowing that it is not something that she'll be cured of and the medications and treatment she is receiving is in order to manage rather than cure her disease.  I attempted to discuss that she has multiple chronic diseases that will continue to progress, but she was not really willing to continue this conversation. She was  willing to talk about who will be the most important people to be involved in her care if she were unable to make decisions for herself. She states that she has completed a healthcare power of attorney naming her daughter as her surrogate. There is a copy of this on the chart.   I attempted to discuss that as her chronic diseases, including known metastatic disease, continued to progress, we will need to focus on medical treatments that are likely to help her with her stated goals spending time with family and being out of the hospital while minimizing interventions that are not likely to lead to this goal.  I presented a MOST form as a tool we could use in order to ensure that her medical team has a good understanding of her wishes. She stated that she may want to fill something like this out in the future, but she did not want to talk about such things at this time and that her daughter knows what her wishes are moving forward. The only other statements she made were that she would not want to be on a ventilator long-term and that she did want a feeding tube regardless of the situation if it were a possibility that she could get one. She then stated again, "if something happens, my daughter will know what to do."  Contacts/Participants in Discussion: Primary Decision Maker: The patient   HCPOA: Yes   Code Status/Advance Care Planning:  Full code.   Symptom Management:  Pain: Patient reports she is not having any pain. She is currently ordered Tylenol. If this is insufficient for pain management, would  recommend addition of oxycodone 5 mg every 4 hours when necessary due to the fact that her creatinine has been elevated and would therefore avoid morphine.  Additional Recommendations (Limitations, Scope, Preferences): The patient specifically expressed that she would want a feeding tube if it is ever a consideration in the future.  Psycho-social/Spiritual:   Support System: Her daughter  Desire  for further Chaplaincy support:no  Prognosis: Unable to determine  Discharge Planning:  Skilled facility for rehabilitation versus discharge home       Chief Complaint/History of Present Illness:  69 year old female with history of a CK D, COPD, Type 2 Diabetes, Breast and Probable Laryngeal Cancer Who Is currently admitted for sepsis likely related to right lower from a cellulitis versus UTI. Palliative was consulted for goals of care in light of multiple chronic medical problems exacerbated by acute infection that led to hospitalization. Primary Diagnoses  Present on Admission:  . Severe sepsis . Sepsis . COPD (chronic obstructive pulmonary disease) . Breast cancer metastasized to bone . Chronic kidney disease, stage III (moderate) . Hyperlipidemia . AKI (acute kidney injury) . Leg wound, right  Palliative Review of Systems: *Denies symptoms today I have reviewed the medical record, interviewed the patient and family, and examined the patient. The following aspects are pertinent.  Past Medical History  Diagnosis Date  . Fibroid   . Morbid obesity   . 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 COPD (chronic obstructive pulmonary disease)   . Chronic diastolic CHF (congestive heart failure)   . NSTEMI (non-ST elevated myocardial infarction)     Cath November 2014 LAD 40% stenosis, first diagonal 80% stenosis, circumflex 20% stenosis, right coronary artery occluded. The EF was 35-40% at that time.  . Anemia     a. Adm 09/2012 with melena, Hgb 5.8 -> transfused. EGD/colonoscopy unrevealing.  . Breast cancer     a. Mets to bone. ER 100%/ PR 0%/Her2 neu negative.  [name] Ulcers of both lower legs   . Tobacco abuse   . Hyperlipidemia 02/11/2013  . Chronic respiratory failure     a. On O2 qhs. also portable O2.  . Chronic renal insufficiency   . Lesion of vocal cord     a. CT 05/2013 concerning for tumor.  . Shortness of breath   . Depression   .  Diabetes mellitus without complication   . Anginal pain   . Dementia     very mild  . Radiation 02/02/14-02/24/14    left and right femur 30 gray   Social History   Social History  . Marital Status: Widowed    Spouse Name: N/A  . Number of Children: 2  . Years of Education: N/A   Occupational History  . retired Energy manager    Social History Main Topics  . Smoking status: Former Smoker -- 2.00 packs/day for 40 years    Types: Cigarettes    Quit date: 09/24/2012  . Smokeless tobacco: Never Used  . Alcohol Use: No  . Drug Use: No  . Sexual Activity: No   Other Topics Concern  . None   Social History Narrative   Lives with husband Gradie Butrick [phone]; Daughter who would like to make medical decisions is [name] [phone]. Patient in school at [employer] for counseling degree.    Family History  Problem Relation Age of Onset  . Cancer Mother     leukemia  . Hypertension Mother   .  Diabetes Father   . Heart disease Father     passed away due to heart attack   Scheduled Meds: . aspirin EC  81 mg Oral Daily  . atorvastatin  40 mg Oral Daily  . heparin  5,000 Units Subcutaneous 3 times per day  . insulin aspart  0-15 Units Subcutaneous TID WC  . insulin aspart  0-5 Units Subcutaneous QHS  . isosorbide mononitrate  60 mg Oral Daily  . letrozole  2.5 mg Oral Daily  . mirtazapine  15 mg Oral QHS  . piperacillin-tazobactam (ZOSYN)  IV  3.375 g Intravenous 3 times per day  . potassium chloride SA  40 mEq Oral BID  . sodium chloride  3 mL Intravenous Q12H  . torsemide  40 mg Oral BID  . vancomycin  1,500 mg Intravenous Q48H   Continuous Infusions:  PRN Meds:.sodium chloride, acetaminophen **OR** acetaminophen, albuterol, nitroGLYCERIN, ondansetron **OR** ondansetron (ZOFRAN) IV, sodium chloride Medications Prior to Admission:  Prior to Admission medications   Medication Sig Start Date End Date Taking? Authorizing   albuterol (PROVENTIL HFA;VENTOLIN HFA)  108 (90 BASE) MCG/ACT inhaler Inhale 2 puffs into the lungs every 6 (six) hours as needed for wheezing or shortness of breath. 12/08/14  Yes Chesley Mires, MD  Amino Acids-Protein Hydrolys (FEEDING SUPPLEMENT, PRO-STAT SUGAR FREE 64,) LIQD Take 30 mLs by mouth 3 (three) times daily with meals.   Yes Historical , MD  Ascorbic Acid (VITAMIN C PO) Take 1 tablet by mouth daily.   Yes Historical , MD  aspirin EC 81 MG EC tablet Take 1 tablet (81 mg total) by mouth daily. 10/02/12  Yes Leone Haven, MD  atorvastatin (LIPITOR) 40 MG tablet Take 40 mg by mouth daily.   Yes Historical , MD  bag balm OINT ointment Apply 1 g topically as needed for dry skin or irritation. 12/03/14  Yes Coral Spikes, DO  clotrimazole (LOTRIMIN) 1 % cream Apply 1 application topically daily. 11/26/14  Yes Coral Spikes, DO  docusate sodium (COLACE) 100 MG capsule Take 100 mg by mouth daily.   Yes Historical , MD  isosorbide mononitrate (IMDUR) 60 MG 24 hr tablet Take 1 tablet (60 mg total) by mouth daily. 04/12/15  Yes Nicolette Bang, DO  letrozole [facility]) 2.5 MG tablet Take 1 tablet (2.5 mg total) by mouth daily. 05/10/15  Yes Truitt Merle, MD  mirtazapine (REMERON) 15 MG tablet Take 1 tablet (15 mg total) by mouth at bedtime. 12/17/14  Yes Truitt Merle, MD  nitroGLYCERIN (NITROSTAT) 0.4 MG SL tablet Place 1 tablet (0.4 mg total) under the tongue every 5 (five) minutes as needed for chest pain. 03/17/13  Yes Mariel Aloe, MD  potassium chloride SA (K-DUR,KLOR-CON) 20 MEQ tablet Take 2 tablets (40 mEq total) by mouth 2 (two) times daily. 04/04/15  Yes Nicolette Bang, DO  torsemide (DEMADEX) 20 MG tablet Take 20-80 mg by mouth See admin instructions. Daughter states she takes 7m in the morning, then take 455min the afternoon.   Yes Historical , MD  VITAMIN D, CHOLECALCIFEROL, PO Take 1,000 mg by mouth daily.    Yes Historical , MD  NON FORMULARY Place 3 L into the nose as  needed (oxygen).     Historical , MD  palbociclib (ILeslee Home125 MG capsule Take 1 capsule (125 mg total) by mouth See admin instructions. Take 12569mvery morning for 2 weeks on,  1 week off . Patient not taking: Reported on 05/10/2015 03/23/15  Truitt Merle, MD  torsemide (DEMADEX) 20 MG tablet TAKE 80  MG IN THE MORNING AND 40 MG AT NIGHT. Patient not taking: Reported on 05/12/2015 04/12/15   Nicolette Bang, DO   Allergies  Allergen Reactions  . Omnipaque [Iohexol] Hives and Other (See Comments)    Pt claims she developed hives after given contrast  . Benzene Rash   CBC:    Component Value Date/Time   WBC 2.9* 05/14/2015 1031   WBC 2.5* 05/10/2015 0918   HGB 8.3* 05/14/2015 1031   HGB 9.0* 05/10/2015 0918   HGB 7.6* 04/13/2015 1539   HCT 26.4* 05/14/2015 1031   HCT 28.5* 05/10/2015 0918   PLT 162 05/14/2015 1031   PLT 167 05/10/2015 0918   MCV 88.9 05/14/2015 1031   MCV 89.9 05/10/2015 0918   NEUTROABS 2.2 05/12/2015 1638   NEUTROABS 1.9 05/10/2015 0918   LYMPHSABS 0.1* 05/12/2015 1638   LYMPHSABS 0.4* 05/10/2015 0918   MONOABS 0.1 05/12/2015 1638   MONOABS 0.2 05/10/2015 0918   EOSABS 0.0 05/12/2015 1638   EOSABS 0.1 05/10/2015 0918   BASOSABS 0.0 05/12/2015 1638   BASOSABS 0.0 05/10/2015 0918   Comprehensive Metabolic Panel:    Component Value Date/Time   NA 137 05/14/2015 1031   NA 139 05/10/2015 0918   K 3.5 05/14/2015 1031   K 3.8 05/10/2015 0918   CL 103 05/14/2015 1031   CL 101 02/11/2013 0950   CO2 21* 05/14/2015 1031   CO2 26 05/10/2015 0918   BUN 36* 05/14/2015 1031   BUN 45.2* 05/10/2015 0918   CREATININE 2.11* 05/14/2015 1031   CREATININE 1.8* 05/10/2015 0918   CREATININE 1.76* 08/13/2014 1512   GLUCOSE 186* 05/14/2015 1031   GLUCOSE 193* 05/10/2015 0918   GLUCOSE 143* 02/11/2013 0950   CALCIUM 7.4* 05/14/2015 1031   CALCIUM 10.0 05/10/2015 0918   CALCIUM 6.6* 09/29/2012 1118   AST 46* 05/12/2015 1638   AST 25 05/10/2015 0918   ALT  31 05/12/2015 1638   ALT 28 05/10/2015 0918   ALKPHOS 80 05/12/2015 1638   ALKPHOS 96 05/10/2015 0918   BILITOT 0.5 05/12/2015 1638   BILITOT 0.35 05/10/2015 0918   PROT 6.9 05/12/2015 1638   PROT 6.9 05/10/2015 0918   ALBUMIN 3.3* 05/12/2015 1638   ALBUMIN 3.3* 05/10/2015 0918    Physical Exam: Vital Signs: BP 99/35 mmHg  Pulse 70  Temp(Src) 98.1 F (36.7 C) (Oral)  Resp 18  Ht 5' 4" (1.626 m)  Wt 105.3 kg (232 lb 2.3 oz)  BMI 39.83 kg/m2  SpO2 97% SpO2: SpO2: 97 % O2 Device: O2 Device: Not Delivered O2 Flow Rate: O2 Flow Rate (L/min): 2 L/min Intake/output summary:  Intake/Output Summary (Last 24 hours) at 05/14/15 1648 Last data filed at 05/14/15 1536  Gross per 24 hour  Intake 319.33 ml  Output      2 ml  Net 317.33 ml   LBM: Last BM Date: 05/12/15 Baseline Weight: Weight: 103.42 kg (228 lb) Most recent weight: Weight: 105.3 kg (232 lb 2.3 oz)  Exam Findings:  General: lying in bed in NAD, can sit up easily. Needs assistance transferring from bed to side chair.  Cardiovascular: RRR. No murmurs appreciated.  Respiratory: Lungs CTAB without wheezing, rhonchi, or crackles. No increased WOB Abdomen: +BS, soft, NTND Extremities: Unable to assess as it is in an [place] has been placed. No tenderness to squeezing.  Neuro: A&Ox3 Psych: Normal affect. Responds appropriately to questions.  Palliative Performance Scale: 60               Additional Data Reviewed: Recent Labs     05/13/15  0814  05/14/15  1031  WBC  2.6*  2.9*  HGB  8.5*  8.3*  PLT  165  162  NA  141  137  BUN  42*  36*  CREATININE  2.17*  2.11*     Time In: 1100 Time Out: 1150 Time Total: 55 Greater than 50%  of this time was spent counseling and coordinating care related to the above assessment and plan.  Signed by: Micheline Rough, MD  Micheline Rough, MD  05/14/2015, 4:48 PM  Please contact Palliative Medicine Team phone at [phone] for questions and concerns.

## 2015-05-14 NOTE — Progress Notes (Signed)
CRITICAL VALUE ALERT  Critical value received:  Lactic Acid 2.5  Date of notification:  05/14/15  Time of notification:  11:37am  Critical value read back:Yes.    Nurse who received alert:  Claudia Desanctis  MD notified (1st page):  Family Medicine Teaching Service  Time of first page:  11:45am  MD notified (2nd page):  Time of second page:  Responding MD:  Family Medicine Teaching Service Dr. Ezequiel Ganser  Time MD responded:  11:50am

## 2015-05-14 NOTE — Evaluation (Addendum)
Physical Therapy Evaluation Patient Details Name: GWENDALYN MCGONAGLE MRN: [id number] DOB: [birth year] Today's Date: 05/14/2015   History of Present Illness  Pt adm with sepsis due to cellulitis of leg. PMH - COPD, DM, CAD, chf, breast CA  Clinical Impression  Pt admitted with above diagnosis and presents to PT with functional limitations due to deficits listed below (See PT problem list). Pt needs skilled PT to maximize independence and safety to allow discharge to home with family. Pt not interested in considering any follow up therapy or any type of assistive device. Will follow acutely but pt not agreeable to any post acute therapy.     Follow Up Recommendations No PT follow up (pt refuses any type of follow up PT)    Equipment Recommendations  None recommended by PT    Recommendations for Other Services       Precautions / Restrictions Precautions Precautions: Fall      Mobility  Bed Mobility                  Transfers Overall transfer level: Needs assistance Equipment used: None Transfers: Sit to/from Stand Sit to Stand: Min guard         General transfer comment: for safety  Ambulation/Gait Ambulation/Gait assistance: Min guard Ambulation Distance (Feet): 10 Feet Assistive device:  (uses furniture) Gait Pattern/deviations: Step-through pattern;Decreased stride length;Wide base of support   Gait velocity interpretation: Below normal speed for age/gender General Gait Details: Assist for safety. Pt uses furniture to steady herself.  Stairs            Wheelchair Mobility    Modified Rankin (Stroke Patients Only)       Balance Overall balance assessment: Needs assistance Sitting-balance support: No upper extremity supported;Feet supported Sitting balance-Leahy Scale: Good     Standing balance support: No upper extremity supported;During functional activity Standing balance-Leahy Scale: Fair                                Pertinent Vitals/Pain Pain Assessment: No/denies pain    Home Living Family/patient expects to be discharged to:: Private residence Living Arrangements: Spouse/significant other Available Help at Discharge: Family;Available 24 hours/day Type of Home: House Home Access: Ramped entrance     Home Layout: One level Home Equipment: Cane - quad;Bedside commode;Wheelchair - manual      Prior Function Level of Independence: Independent         Comments: takes SCAT to appointments and grocery store, attends classes at [employer]: Right    Extremity/Trunk Assessment   Upper Extremity Assessment: Overall WFL for tasks assessed           Lower Extremity Assessment: Generalized weakness         Communication   Communication: No difficulties  Cognition Arousal/Alertness: Awake/alert Behavior During Therapy: WFL for tasks assessed/performed Overall Cognitive Status: Within Functional Limits for tasks assessed                      General Comments      Exercises        Assessment/Plan    PT Assessment Patient needs continued PT services  PT Diagnosis Difficulty walking;Generalized weakness   PT Problem List Decreased strength;Decreased activity tolerance;Decreased balance;Decreased mobility;Obesity  PT Treatment Interventions DME instruction;Gait training;Functional mobility training;Therapeutic activities;Therapeutic exercise;Balance training;Patient/family education   PT Goals (Current goals can be found  in the Care Plan section) Acute Rehab PT Goals Patient Stated Goal: go home PT Goal Formulation: With patient Time For Goal Achievement: 05/21/15 Potential to Achieve Goals: Good    Frequency Min 3X/week   Barriers to discharge        Co-evaluation               End of Session   Activity Tolerance: Other (comment) (self limiting) Patient left: in chair;with call bell/phone within reach;with chair  alarm set Nurse Communication: Mobility status         Time: 8937-3428 PT Time Calculation (min) (ACUTE ONLY): 7 min   Charges:   PT Evaluation $Initial PT Evaluation Tier I: 1 Procedure     PT G Codes:        MAYCOCK,CARY 05-Jun-2015, 2:35 PM  [facility] PT [phone]

## 2015-05-14 NOTE — Progress Notes (Signed)
Family Medicine Teaching Service Daily Progress Note Intern Pager: [phone]  Patient name: Anita [name] Medical record number: [id number] Date of birth: 11-Dec-1945 Age: 69 y.o. Gender: female  Primary Care Provider: [name], DO Consultants: Wound Care  Code Status: Full   Pt Overview and Major Events to Date:  9/16: Admitted for sepsis, started on vanc/zosyn  Assessment and Plan: Anita [name] is a 69 y.o. female presenting with weakness secondary to sepsis. PMH is significant for breast cancer with known spinal metastasis, COPD, type 2 diabetes, HLD, CAD, CKD stage III, chronic diastolic heart failure  # Sepsis secondary to RLE cellulitis/open wound and/or UTI: Met sepsis criteria;  QSOFA score 2 on admission. Chronic wound x 1 month after fall and has been followed by wound care clinic. i-stat Lactic acid 3.09 with improvement to 2.1. CXR without evidence of pneumonia. UA concerning for UTI with positive nitrites and large leuks, does not endorse symptoms. Some confusion in ED but overall mentation seems stable. Started on vanc and zosyn in ED.   Xray Right Tibia/Fibula with diffuse soft tissue swelling; no focal bone abnormality  - continue to monitor on telemetry - continue vancomycin (9/15>>) - continue zosyn (9/15>>) - blood and urine cultures pending  (however no urinary symptoms)  - hold on fluids as BP stable (known CHF) - gentle boluses as needed - wound care consult- currently has una boot placed- discussed contacting primary team for boot changes.  - will repeat a lactic acid to see if it has completely normalized.  - If fevers or worsening pain, low threshold for MRI to evaluate for deep tissue infection.   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 - hold palbociclib  - continue letrozole (appears to be non-bone marrow suppressing)  - on remeron for appetite - palliative care consult placed by admitting MD for goals of care.   # HFpEF/CHF: BNP 160.5, does not appear to be overtly fluid overloaded. Last echo 01/2014 EF 55% - continue torsemide 38m BID (home dose 882mAM, 4055mM) - continue imdur 68m33mily  # HLD: - continue atorvastatin  FEN/GI: HH/carb modified diet / saline lock Prophylaxis: heparin sq   Disposition: ? SNF placement pending clinical improvement (PT consulted for evaluation)   Subjective:  Denies pain in lower extremities. Comfortable on RA. Denies urinary symptoms. Frustrated that the bed alarm is on.   Objective: Temp:  [98.4 F (36.9 C)-98.9 F (37.2 C)] 98.9 F (37.2 C) (09/16 2302) Pulse Rate:  [81-101] 81 (09/16 2302) Resp:  [16] 16 (09/16 2302) BP: (91-110)/(42-46) 100/45 mmHg (09/16 2302) SpO2:  [98 %-100 %] 100 % (09/16 2302) Weight:  [232 lb 2.3 oz (105.3 kg)] 232 lb 2.3 oz (105.3 kg) (09/16 [id number] Exam: General: lying in bed in NAD, can sit up easily. Needs assistance transferring from bed to side chair.  Cardiovascular: RRR. No murmurs appreciated.  Respiratory: Lungs CTAB without wheezing, rhonchi, or crackles. No increased WOB  Abdomen: +BS, soft, NTND Extremities: Unable to assess as it is in an [place] has been placed. No tenderness to squeezing.  Neuro: A&Ox3 Psych: Normal affect. Responds appropriately to questions.    Laboratory:  Recent Labs Lab 05/10/15 0918 05/12/15 1638 05/13/15 0814  WBC 2.5* 2.4* 2.6*  HGB 9.0* 9.4* 8.5*  HCT 28.5* 28.4* 27.2*  PLT 167 164 165    Recent Labs Lab  05/10/15 0918 05/12/15 1638 05/13/15 0814  NA 139 134* 141  K 3.8 3.9 3.5  CL  --  101 109  CO2 26 21* 23  BUN 45.2* 51* 42*  CREATININE 1.8* 2.29* 2.17*  CALCIUM 10.0 8.8* 8.0*  PROT 6.9 6.9  --   BILITOT 0.35 0.5  --   ALKPHOS 96 80  --   ALT 28 31  --   AST 25 46*  --   GLUCOSE 193* 203* 165*    U/A: large leukocyte esterase, +nitrite, TNTC WBC, few bacteria  Imaging/Diagnostic Tests: Dg Chest 2 View  05/12/2015   CLINICAL DATA:  Shortness of breath. Syncopal episode. History of breast carcinoma  EXAM: CHEST  2 VIEW  COMPARISON:  April 04, 2015 chest radiograph and chest CT May 13, 2013  FINDINGS: There is no edema or consolidation. Heart size and pulmonary vascularity are within normal limits. No adenopathy. Widespread bony metastatic disease noted on prior CT. There are healed fractures of the left sixth and seventh ribs with the seventh rib showing sclerotic change consistent with metastasis. There is a lytic appearing lesion in the lateral right clavicle. There is a more subtle lytic lesion in the proximal left humerus.  IMPRESSION: No edema or consolidation. Bony metastases. Given the chronicity of the bony changes compared to prior CT, it may be reasonable to correlate with nuclear medicine bone scan at this time to assess for degree of metabolic activity of the apparent bony metastases. A lesion in the lateral right clavicle appears lytic and may have progressed from 2014 in particular.   Electronically Signed   By: Lowella Grip III M.D.   On: 05/12/2015 17:23   Dg Tibia/fibula Right  05/13/2015   CLINICAL DATA:  Right lower leg cellulitis  EXAM: RIGHT TIBIA AND FIBULA - 2 VIEW  COMPARISON:  04/04/2015  FINDINGS: Diffuse soft tissue edema is again noted. The underlying osseous structures appear to be intact. No focal bone erosions identified.  IMPRESSION: 1. Diffuse soft tissue swelling.  No focal bone abnormality.   Electronically Signed   By: Kerby Moors M.D.   On:  05/13/2015 10:15    Archie Patten, MD 05/14/2015, 6:29 AM PGY-2, [place] Intern pager: [phone], text pages welcome

## 2015-05-15 DIAGNOSIS — E119 Type 2 diabetes mellitus without complications: Secondary | ICD-10-CM

## 2015-05-15 LAB — CBC
HEMATOCRIT: 24.1 % — AB (ref 36.0–46.0)
Hemoglobin: 7.8 g/dL — ABNORMAL LOW (ref 12.0–15.0)
MCH: 29.1 pg (ref 26.0–34.0)
MCHC: 32.4 g/dL (ref 30.0–36.0)
MCV: 89.9 fL (ref 78.0–100.0)
PLATELETS: 182 10*3/uL (ref 150–400)
RBC: 2.68 MIL/uL — AB (ref 3.87–5.11)
RDW: 19.6 % — AB (ref 11.5–15.5)
WBC: 3 10*3/uL — AB (ref 4.0–10.5)

## 2015-05-15 LAB — BASIC METABOLIC PANEL
Anion gap: 9 (ref 5–15)
BUN: 31 mg/dL — AB (ref 6–20)
CALCIUM: 7.8 mg/dL — AB (ref 8.9–10.3)
CO2: 20 mmol/L — ABNORMAL LOW (ref 22–32)
CREATININE: 1.93 mg/dL — AB (ref 0.44–1.00)
Chloride: 110 mmol/L (ref 101–111)
GFR, EST AFRICAN AMERICAN: 30 mL/min — AB (ref >60)
GFR, EST NON AFRICAN AMERICAN: 26 mL/min — AB (ref >60)
Glucose, Bld: 144 mg/dL — ABNORMAL HIGH (ref 65–99)
Potassium: 3.8 mmol/L (ref 3.5–5.1)
SODIUM: 139 mmol/L (ref 135–145)

## 2015-05-15 LAB — URINE CULTURE

## 2015-05-15 LAB — GLUCOSE, CAPILLARY
GLUCOSE-CAPILLARY: 151 mg/dL — AB (ref 65–99)
GLUCOSE-CAPILLARY: 176 mg/dL — AB (ref 65–99)
Glucose-Capillary: 137 mg/dL — ABNORMAL HIGH (ref 65–99)

## 2015-05-15 MED ORDER — DOXYCYCLINE HYCLATE 100 MG PO TABS: 100.0000 mg | ORAL_TABLET | Freq: Two times a day (BID) | ORAL | Status: DC

## 2015-05-15 MED ORDER — CIPROFLOXACIN HCL 500 MG PO TABS
500.0000 mg | ORAL_TABLET | Freq: Two times a day (BID) | ORAL | Status: DC
  Administered 2015-05-15: 500 mg via ORAL
  Filled 2015-05-15: qty 1

## 2015-05-15 MED ORDER — DOXYCYCLINE HYCLATE 100 MG PO TABS
100.0000 mg | ORAL_TABLET | Freq: Two times a day (BID) | ORAL | Status: DC
Start: 2015-05-15 — End: 2015-05-15
  Administered 2015-05-15: 100 mg via ORAL
  Filled 2015-05-15: qty 1

## 2015-05-15 MED ORDER — CIPROFLOXACIN HCL 500 MG PO TABS: 500.0000 mg | ORAL_TABLET | Freq: Two times a day (BID) | ORAL | Status: DC

## 2015-05-15 NOTE — Progress Notes (Addendum)
ANTIBIOTIC CONSULT NOTE - FOLLOW UP  Pharmacy Consult for Vancomycin/Zosyn Indication: rule out sepsis  Allergies  Allergen Reactions  . Omnipaque [Iohexol] Hives and Other (See Comments)    Pt claims she developed hives after given contrast  . Benzene Rash    Patient Measurements: Height: 5\' 4"  (162.6 cm) Weight: 232 lb 2.3 oz (105.3 kg) IBW/kg (Calculated) : 54.7  Vital Signs: Temp: 98.4 F (36.9 C) (09/18 0622) Temp Source: Oral (09/18 0622) BP: 120/57 mmHg (09/18 0622) Pulse Rate: 91 (09/18 0622) Intake/Output from previous day: 09/17 0701 - 09/18 0700 In: 603 [P.O.:600; I.V.:3] Out: 652 [Urine:651; Stool:1] Intake/Output from this shift:    Labs:  Recent Labs  05/13/15 0814 05/14/15 1031 05/15/15 0542  WBC 2.6* 2.9* 3.0*  HGB 8.5* 8.3* 7.8*  PLT 165 162 182  CREATININE 2.17* 2.11* 1.93*   Estimated Creatinine Clearance: 33 mL/min (by C-G formula based on Cr of 1.93). No results for input(s): VANCOTROUGH, VANCOPEAK, VANCORANDOM, GENTTROUGH, GENTPEAK, GENTRANDOM, TOBRATROUGH, TOBRAPEAK, TOBRARND, AMIKACINPEAK, AMIKACINTROU, AMIKACIN in the last 72 hours.   Microbiology: Recent Results (from the past 720 hour(s))  Blood Culture (routine x 2)     Status: None (Preliminary result)   Collection Time: 05/12/15  4:38 PM  Result Value Ref Range Status   Specimen Description BLOOD LEFT ARM  Final   Special Requests BOTTLES DRAWN AEROBIC AND ANAEROBIC 5CC  Final   Culture NO GROWTH 2 DAYS  Final   Report Status PENDING  Incomplete  Blood Culture (routine x 2)     Status: None (Preliminary result)   Collection Time: 05/12/15  4:50 PM  Result Value Ref Range Status   Specimen Description BLOOD RIGHT ARM  Final   Special Requests BOTTLES DRAWN AEROBIC AND ANAEROBIC 4ML  Final   Culture NO GROWTH 2 DAYS  Final   Report Status PENDING  Incomplete  Urine culture     Status: None (Preliminary result)   Collection Time: 05/12/15  7:02 PM  Result Value Ref Range Status    Specimen Description URINE, CATHETERIZED  Final   Special Requests NONE  Final   Culture CULTURE REINCUBATED FOR BETTER GROWTH  Final   Report Status PENDING  Incomplete  MRSA PCR Screening     Status: None   Collection Time: 05/13/15  1:28 AM  Result Value Ref Range Status   MRSA by PCR NEGATIVE NEGATIVE Final    Comment:        The GeneXpert MRSA Assay (FDA approved for NASAL specimens only), is one component of a comprehensive MRSA colonization surveillance program. It is not intended to diagnose MRSA infection nor to guide or monitor treatment for MRSA infections.   C difficile quick scan w PCR reflex     Status: None   Collection Time: 05/13/15  7:25 PM  Result Value Ref Range Status   C Diff antigen NEGATIVE NEGATIVE Final   C Diff toxin NEGATIVE NEGATIVE Final   C Diff interpretation Negative for toxigenic C. difficile  Final    Anti-infectives    Start     Dose/Rate Route Frequency Ordered Stop   05/14/15 1800  vancomycin (VANCOCIN) 1,500 mg in sodium chloride 0.9 % 500 mL IVPB     1,500 mg 250 mL/hr over 120 Minutes Intravenous Every 48 hours 05/12/15 2115     05/12/15 2200  piperacillin-tazobactam (ZOSYN) IVPB 3.375 g     3.375 g 12.5 mL/hr over 240 Minutes Intravenous 3 times per day 05/12/15 2115  05/12/15 1730  vancomycin (VANCOCIN) 2,000 mg in sodium chloride 0.9 % 500 mL IVPB     2,000 mg 250 mL/hr over 120 Minutes Intravenous  Once 05/12/15 1714 05/12/15 1959   05/12/15 1715  piperacillin-tazobactam (ZOSYN) IVPB 3.375 g     3.375 g 100 mL/hr over 30 Minutes Intravenous  Once 05/12/15 1714 05/12/15 1753      Assessment: 69 yo F presented to the ED with sepsis secondary to RLE cellulitis and open wound vs UTI. Pharmacy consulted to dose vanc for rule out sepsis. Afeb, wbc 3, CrCl ~ 30 mL/min.   Goal of Therapy:  Vancomycin trough level 15-20 mcg/ml  Plan:  Vancomycin 1500 mg IV q48h - next dose 9/17 at 1800(dose not given as pt's family  refused). Zosyn 3.375 gm IV q8h (4 hour infusion). Follow-up renal function, cx data, and clinical progress.  Angela Burke, PharmD Pharmacy Resident Pager: [phone] 05/15/2015,9:08 AM

## 2015-05-15 NOTE — Discharge Instructions (Signed)
Thanks for letting us take care of you.   Continue to keep your wound clean and dry.   Take your medications, especially your antibiotics as prescribed.   Follow up with your oncologist as scheduled, and with your primary doctor on 10/7 at 3 pm.   If your symptoms worsen, or fail to improve, please call the [facility] or return to the ED for evaluation.   Thanks for letting us take care of you.   Sincerely,  Paula Compton, MD Family Medicine - PGY 2

## 2015-05-15 NOTE — Progress Notes (Signed)
Family Medicine Teaching Service Daily Progress Note Intern Pager: [phone]  Patient name: Anita [name] Medical record number: [id number] Date of birth: 09-May-1946 Age: 69 y.o. Gender: female  Primary Care Provider: [name], DO Consultants: Wound Care  Code Status: Full   Pt Overview and Major Events to Date:  9/16: Admitted for sepsis, started on vanc/zosyn  Assessment and Plan: Anita [name] is a 69 y.o. female presenting with weakness secondary to sepsis. PMH is significant for breast cancer with known spinal metastasis, COPD, type 2 diabetes, HLD, CAD, CKD stage III, chronic diastolic heart failure  # Sepsis secondary to RLE cellulitis/open wound: Met sepsis criteria, Started on vanc and zosyn in ED.  Xray Right Tibia/Fibula with diffuse soft tissue swelling; no focal bone abnormality. Exam with improvement in cellulitis.  Afebrile since 9/15. Improving overall.    - continue to monitor on telemetry - continue vancomycin (9/15>>9/18) - continue zosyn (9/15>>9/18) - Will adjust antibiotic therapy to Oral Doxycycline today. Should continue for another 10-14 days.  - blood and urine cultures NGTD - hold on fluids as BP stable (known CHF) - gentle boluses as needed - wound care consult- currently has una boot placed- discussed contacting primary team for boot changes. Changing once weekly. Needs outpatient follow up.  - If fevers or worsening pain, low threshold for MRI to evaluate for deep tissue infection.   # AKI on CKD: SCr 2.29 on admission (baseline around 1.6-1.7) - Concern about creatinine given tx with Vancomycin.  - Started coming down today. Now 1.93.  - Continue to follow.    # Anemia of Chronic Disease: Iron studies performed 03/16/15 supporting this. She has a recent baseline of between 8-9 hemoglobin. She is asymptomatic. She has required transfusion in the past when she initially injured her leg. Not on supplementation or epo.  - Consider epo as an  outpatient.  - Need to have CBC trended tomorrow.  - If it continues to drop, or if she becomes symptomatic, may require transfusion.   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 - hold palbociclib  - continue letrozole (appears to be non-bone marrow suppressing)  - on remeron for appetite - palliative care consult placed by admitting MD for goals of care.  - Goals of care discussed by palliative care. She has a HCPOA (her daughter). Full code. Symptom management.  - Consider Oxycodone for pain management given CKD.   # HFpEF/CHF: BNP 160.5, does not appear to be overtly fluid overloaded. Last echo 01/2014 EF 55% - continue torsemide 40mg  BID (home dose 80mg  AM, 40mg  PM) - continue imdur 60mg  daily  # HLD: - continue atorvastatin  FEN/GI: HH/carb modified diet / saline lock Prophylaxis: heparin sq   Disposition: Refusing PT. Refusing SNF. Wants to go home. Hemoglobin dropping. Likely home tomorrow.   Subjective:  Denies pain in the lower extremities. She feels much better. Sitting up and enjoying breakfast when I saw her. She says she would like to go home soon if possible. No shortness of breath, dizziness. She denies swelling, pain, or discomfort in her lower  extremities.    Objective: Temp:  [98.1 F (36.7 C)-98.4 F (36.9 C)] 98.4 F (36.9 C) (09/18 0622) Pulse Rate:  [70-91] 91 (09/18 0622) Resp:  [14-18] 18 (09/18 0622) BP: (99-120)/(35-57) 120/57 mmHg (09/18 0622) SpO2:  [96 %-98 %] 96 % (09/18 0622) Physical Exam: General: sitting by the window, eating breakfast, in good spirits.  Cardiovascular:  RRR. No murmurs appreciated.  Respiratory: Lungs CTAB, unlabored, appropriate rate.  Abdomen: +BS, soft, NTND Extremities: Unable to assess RLE as it is in an [place] has been placed. Upper margin of laceration visible with no signs of infection or drainage. LLE with small excoriations, some blood on the bandage, a very small amount of redness, but does not overtly appear infected. Bandage in place, but somewhat slipped down. No tenderness to squeezing.  Neuro: A&Ox3 Psych: Normal affect. Responds appropriately to questions.    Laboratory:  Recent Labs Lab 05/13/15 0814 05/14/15 1031 05/15/15 0542  WBC 2.6* 2.9* 3.0*  HGB 8.5* 8.3* 7.8*  HCT 27.2* 26.4* 24.1*  PLT 165 162 182    Recent Labs Lab 05/10/15 0918  05/12/15 1638 05/13/15 0814 05/14/15 1031 05/15/15 0542  NA 139  < > 134* 141 137 139  K 3.8  < > 3.9 3.5 3.5 3.8  CL  --   < > 101 109 103 110  CO2 26  < > 21* 23 21* 20*  BUN 45.2*  < > 51* 42* 36* 31*  CREATININE 1.8*  < > 2.29* 2.17* 2.11* 1.93*  CALCIUM 10.0  < > 8.8* 8.0* 7.4* 7.8*  PROT 6.9  --  6.9  --   --   --   BILITOT 0.35  --  0.5  --   --   --   ALKPHOS 96  --  80  --   --   --   ALT 28  --  31  --   --   --   AST 25  --  46*  --   --   --   GLUCOSE 193*  < > 203* 165* 186* 144*  < > = values in this interval not displayed.  U/A: large leukocyte esterase, +nitrite, TNTC WBC, few bacteria  Imaging/Diagnostic Tests: Dg Chest 2 View  05/12/2015   CLINICAL DATA:  Shortness of breath. Syncopal episode. History of breast carcinoma  EXAM: CHEST  2 VIEW  COMPARISON:  April 04, 2015 chest radiograph and chest CT May 13, 2013  FINDINGS: There is no edema or consolidation. Heart size and pulmonary vascularity are within normal limits. No adenopathy. Widespread bony metastatic disease noted on prior CT. There are healed fractures of the left sixth and seventh ribs with the seventh rib showing sclerotic change consistent with metastasis. There is a lytic  appearing lesion in the lateral right clavicle. There is a more subtle lytic lesion in the proximal left humerus.  IMPRESSION: No edema or consolidation. Bony metastases. Given the chronicity of the bony changes compared to prior CT, it may be reasonable to correlate with nuclear medicine bone scan at this time to assess for degree of metabolic activity of the apparent bony metastases. A lesion in the lateral right clavicle appears lytic and may have progressed from 2014 in particular.   Electronically Signed   By: Lowella Grip III M.D.   On: 05/12/2015 17:23   Dg Tibia/fibula Right  05/13/2015   CLINICAL DATA:  Right lower leg cellulitis  EXAM: RIGHT TIBIA AND FIBULA - 2 VIEW  COMPARISON:  04/04/2015  FINDINGS: Diffuse soft tissue edema is again noted. The underlying osseous structures appear to be intact. No focal bone erosions identified.  IMPRESSION: 1. Diffuse soft tissue swelling.  No focal bone abnormality.   Electronically Signed   By: Kerby Moors M.D.   On: 05/13/2015 10:15    Aquilla Hacker, MD 05/15/2015, 10:38 AM PGY-2, [place] Intern pager: [phone], text pages welcome

## 2015-05-15 NOTE — Discharge Summary (Signed)
[facility] Discharge Summary  Patient name: Anita [name] Medical record number: [id number] Date of birth: [birth year] Age: 69 y.o. Gender: female Date of Admission: 05/12/2015  Date of Discharge: 05/15/2015 Admitting Physician: Blane Ohara McDiarmid, MD  Primary Care Provider: [name], DO Consultants: Wound Care   Indication for Hospitalization: Sepsis   Discharge Diagnoses/Problem List:  Patient Active Problem List   Diagnosis Date Noted  . Palliative care encounter   . Wound cellulitis   . Severe sepsis 05/12/2015  . Sepsis 05/12/2015  . AKI (acute kidney injury) 05/12/2015  . Leg wound, right 05/12/2015  . Laceration   . Absolute anemia   . CAD in native artery   . Venous stasis ulcer   . COLD (chronic obstructive lung disease)   . Leg laceration 04/04/2015  . Laceration of lower extremity 04/04/2015  . Venous stasis of lower extremity 12/05/2014  . Intertrigo 02/19/2014  . 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 Hyperlipidemia 02/11/2013  . Breast cancer metastasized to bone 10/14/2012  . Chronic respiratory failure 10/06/2012  . Chronic diastolic heart failure 62/13/0865  . CAD (coronary artery disease) 10/06/2012  . Chronic kidney disease, stage III (moderate) 10/06/2012  . Anemia of chronic disease 10/06/2012  . Breast mass, right 10/06/2012  . History of tobacco abuse 10/06/2012  . COPD (chronic obstructive pulmonary disease) 09/27/2012  . DM2 (diabetes mellitus, type 2) 09/26/2012    Disposition: Home   Discharge Condition: Stable   Brief Hospital Course:  Anita [name] is a 69 y.o. female who presented with weakness secondary to sepsis. PMH is significant for breast cancer with known spinal metastasis, COPD, type 2 diabetes, HLD, CAD, CKD. Patient has chronic wound of right LE x 1 month s/p fall. UA also appeared infected with positive nitrites and large leuks. She was febrile to 103F at admission. Xray Right Tibia/Fibula with  diffuse soft tissue swelling; no focal bone abnormality.  She was given over 48 hours of IV Zosyn and Vancomycin. She remained afebrile and was transitioned to oral doxycycline. Would care was consulted and an una boot was placed.   Issues for Follow Up:  1. Wound Care: Monitor healing progress of right lower extremity wound; will need weekly changes of boot 2. Anemia of Chronic Disease: Recheck HgB and consider Epo/ supplementation   Significant Procedures: none   Significant Labs and Imaging:   Recent Labs Lab 05/13/15 0814 05/14/15 1031 05/15/15 0542  WBC 2.6* 2.9* 3.0*  HGB 8.5* 8.3* 7.8*  HCT 27.2* 26.4* 24.1*  PLT 165 162 182    Recent Labs Lab 05/10/15 0918  05/12/15 1638 05/13/15 0814 05/14/15 1031 05/15/15 0542  NA 139  --  134* 141 137 139  K 3.8  < > 3.9 3.5 3.5 3.8  CL  --   --  101 109 103 110  CO2 26  --  21* 23 21* 20*  GLUCOSE 193*  --  203* 165* 186* 144*  BUN 45.2*  --  51* 42* 36* 31*  CREATININE 1.8*  --  2.29* 2.17* 2.11* 1.93*  CALCIUM 10.0  --  8.8* 8.0* 7.4* 7.8*  ALKPHOS 96  --  80  --   --   --   AST 25  --  46*  --   --   --   ALT 28  --  31  --   --   --   ALBUMIN 3.3*  --  3.3*  --   --   --   < > =  values in this interval not displayed.   Results/Tests Pending at Time of Discharge: Final Result of Blood Cultures   Discharge Medications:    Medication List    TAKE these medications        albuterol 108 (90 BASE) MCG/ACT inhaler  Commonly known as:  PROVENTIL HFA;VENTOLIN HFA  Inhale 2 puffs into the lungs every 6 (six) hours as needed for wheezing or shortness of breath.     aspirin 81 MG EC tablet  Take 1 tablet (81 mg total) by mouth daily.     atorvastatin 40 MG tablet  Commonly known as:  LIPITOR  Take 40 mg by mouth daily.     bag balm Oint ointment  Apply 1 g topically as needed for dry skin or irritation.     ciprofloxacin 500 MG tablet  Commonly known as:  CIPRO  Take 1 tablet (500 mg total) by mouth 2 (two)  times daily.     clotrimazole 1 % cream  Commonly known as:  LOTRIMIN  Apply 1 application topically daily.     docusate sodium 100 MG capsule  Commonly known as:  COLACE  Take 100 mg by mouth daily.     doxycycline 100 MG tablet  Commonly known as:  VIBRA-TABS  Take 1 tablet (100 mg total) by mouth every 12 (twelve) hours.     feeding supplement (PRO-STAT SUGAR FREE 64) Liqd  Take 30 mLs by mouth 3 (three) times daily with meals.     isosorbide mononitrate 60 MG 24 hr tablet  Commonly known as:  IMDUR  Take 1 tablet (60 mg total) by mouth daily.     letrozole 2.5 MG tablet  Commonly known as:  FEMARA  Take 1 tablet (2.5 mg total) by mouth daily.     mirtazapine 15 MG tablet  Commonly known as:  REMERON  Take 1 tablet (15 mg total) by mouth at bedtime.     nitroGLYCERIN 0.4 MG SL tablet  Commonly known as:  NITROSTAT  Place 1 tablet (0.4 mg total) under the tongue every 5 (five) minutes as needed for chest pain.     NON FORMULARY  Place 3 L into the nose as needed (oxygen).     palbociclib 125 MG capsule  Commonly known as:  IBRANCE  Take 1 capsule (125 mg total) by mouth See admin instructions. Take 125mg  every morning for 2 weeks on,  1 week off .     potassium chloride SA 20 MEQ tablet  Commonly known as:  K-DUR,KLOR-CON  Take 2 tablets (40 mEq total) by mouth 2 (two) times daily.     torsemide 20 MG tablet  Commonly known as:  DEMADEX  Take 20-80 mg by mouth See admin instructions. Daughter states she takes 80mg  in the morning, then take 40mg  in the afternoon.     torsemide 20 MG tablet  Commonly known as:  DEMADEX  TAKE 80  MG IN THE MORNING AND 40 MG AT NIGHT.     VITAMIN C PO  Take 1 tablet by mouth daily.     VITAMIN D (CHOLECALCIFEROL) PO  Take 1,000 mg by mouth daily.        Discharge Instructions: Please refer to Patient Instructions section of EMR for full details.  Patient was counseled important signs and symptoms that should prompt return to  medical care, changes in medications, dietary instructions, activity restrictions, and follow up appointments.   Follow-Up Appointments:     Follow-up Information  Follow up with [facility]              On 05/20/2015.   Why:  wound care scheduled    Contact information:   [address] [phone]      Follow up with [name], DO. Go on 06/03/2015.   Specialty:  Family Medicine   Why:  Hospital Follow Up - 3:00 pm.    Contact information:   [place] [address] [phone]       Nicolette Bang, DO 05/15/2015, 9:33 PM PGY-1, [place]

## 2015-05-16 IMAGING — CR DG CHEST 2V
2 series · 2 of 2 positions shown · non-contrast
Comparison: Chest radiograph June 19, 2014; chest CT September 01, 2014

CLINICAL DATA: Fever and cough.  Metastatic breast carcinoma

EXAM:
CHEST  2 VIEW

[w chest pa]
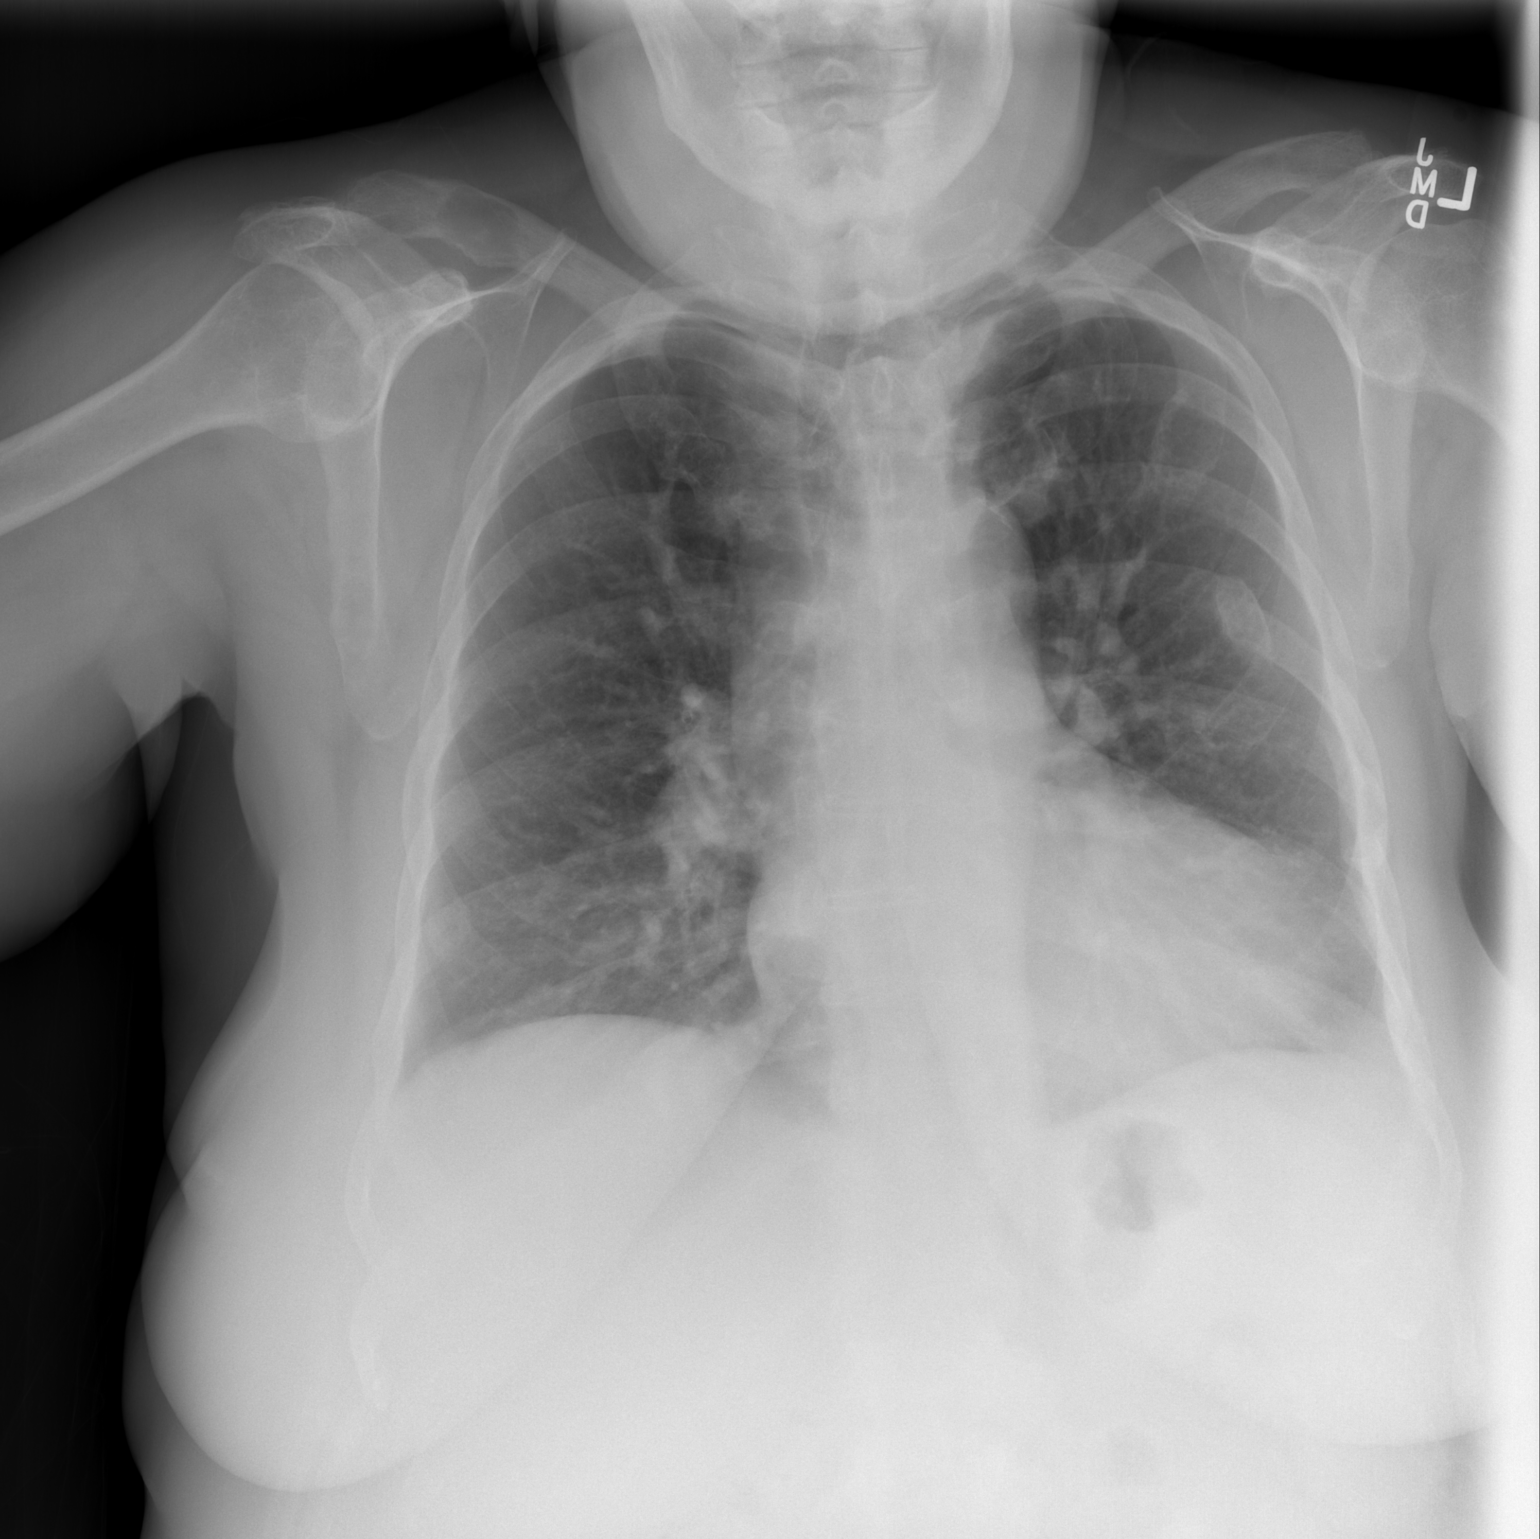

[w chest lat]
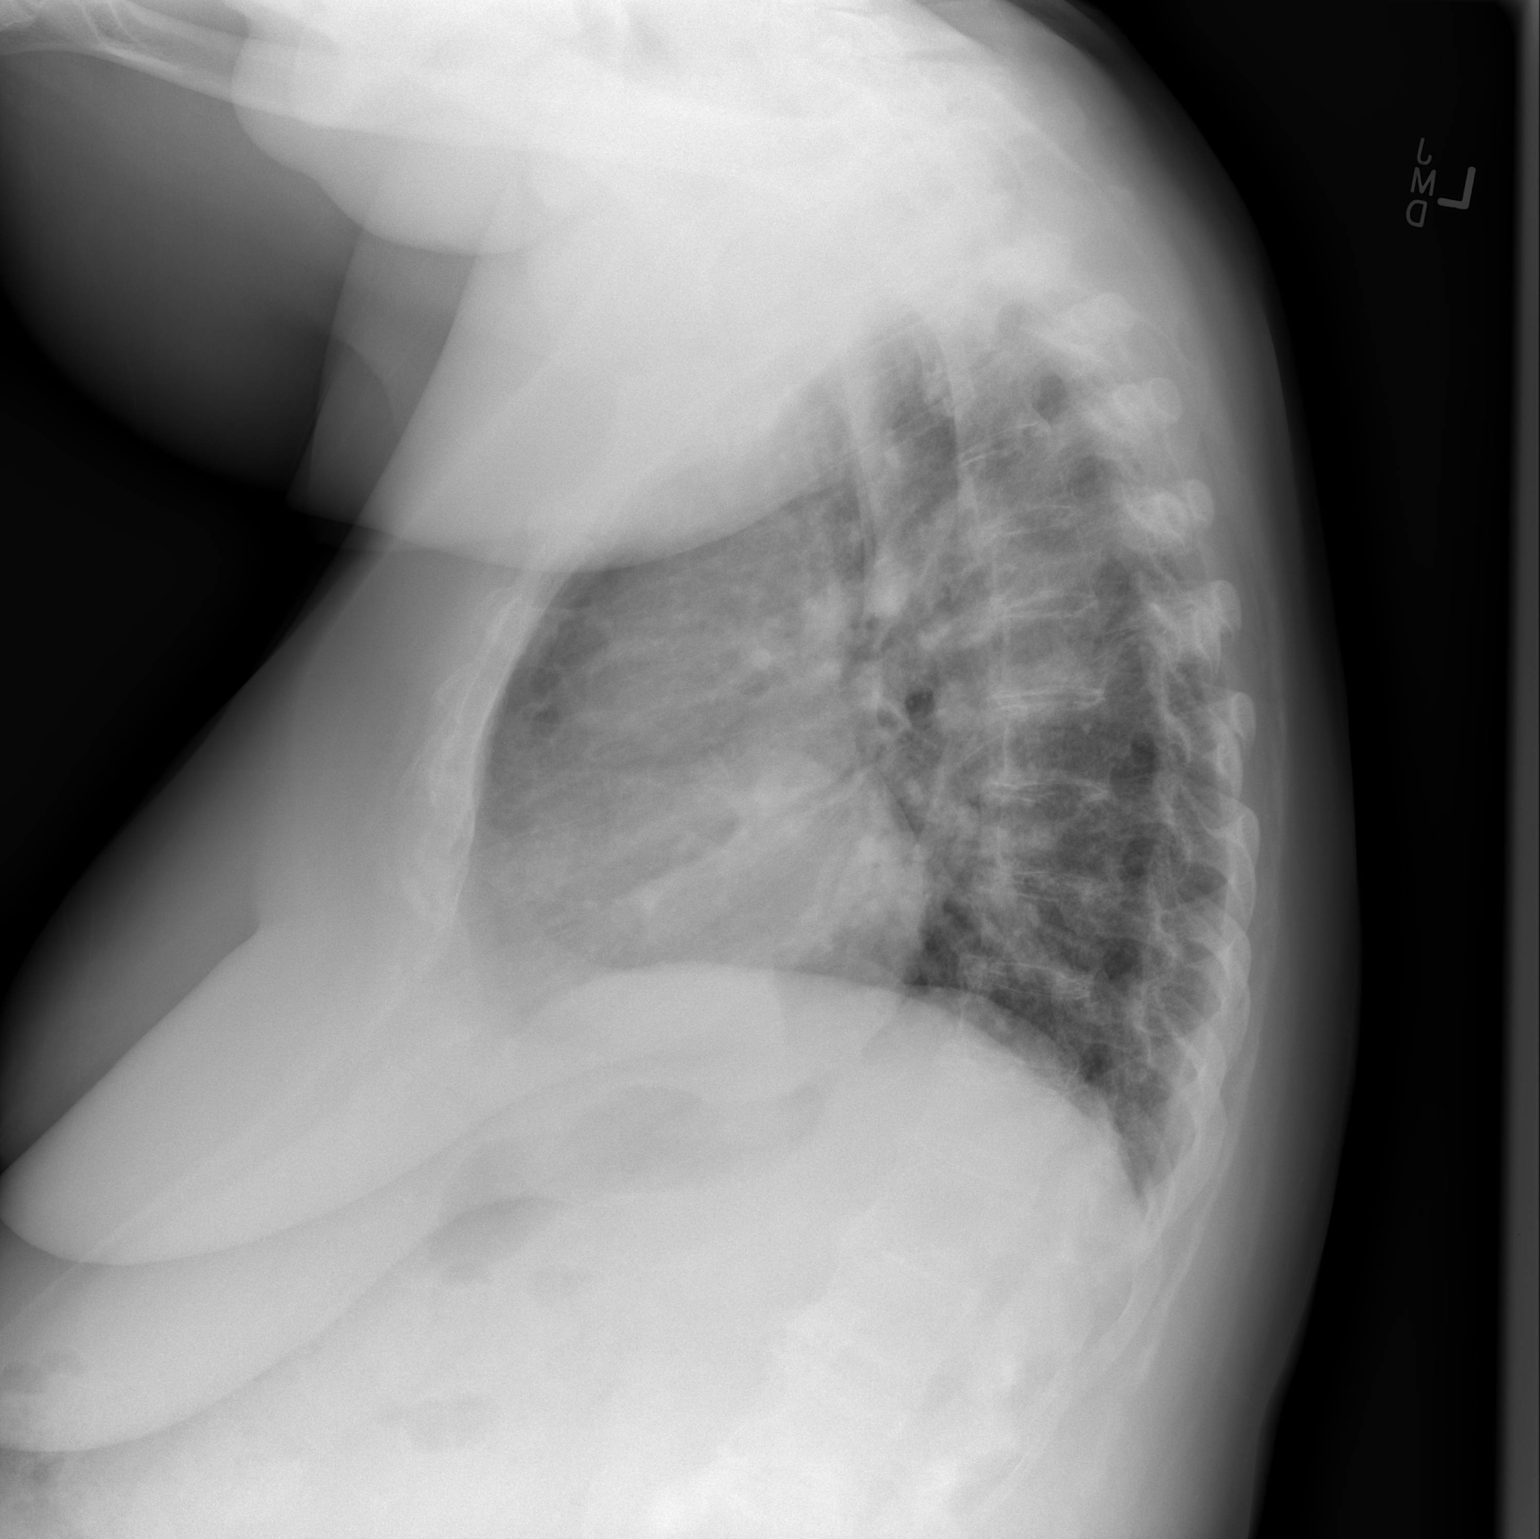

[2 of 2 positions shown; findings below may reference images not displayed]

FINDINGS: There is no edema or consolidation. Heart is upper normal in size
with pulmonary vascularity within normal limits. No adenopathy
apparent. There is evidence of a prior fracture of the left
posterior sixth rib. There is an expansile metastasis in the
anterior left second rib. There is also metastatic disease in both
anterior first ribs, the lateral right clavicle, in the
inferolateral right scapula. A more subtle lesion is seen in the
inferior left scapula.
IMPRESSION: Multiple bony metastases. No edema or consolidation. No adenopathy
appreciable.

## 2015-05-17 LAB — CULTURE, BLOOD (ROUTINE X 2)
CULTURE: NO GROWTH
Culture: NO GROWTH

## 2015-05-19 ENCOUNTER — Telehealth: Payer: Self-pay | Admitting: Internal Medicine

## 2015-05-19 NOTE — Telephone Encounter (Signed)
Pt recently discharged from the hospital, was discharged with doxycycline and ciprofloxacin, wants to be sure Dr. Juleen China wants her to take both.

## 2015-05-20 DIAGNOSIS — W19XXXD Unspecified fall, subsequent encounter: Secondary | ICD-10-CM | POA: Diagnosis not present

## 2015-05-20 DIAGNOSIS — T8133XD Disruption of traumatic injury wound repair, subsequent encounter: Secondary | ICD-10-CM | POA: Diagnosis not present

## 2015-05-20 DIAGNOSIS — I83893 Varicose veins of bilateral lower extremities with other complications: Secondary | ICD-10-CM | POA: Diagnosis not present

## 2015-05-20 DIAGNOSIS — S76221S Laceration of adductor muscle, fascia and tendon of right thigh, sequela: Secondary | ICD-10-CM | POA: Diagnosis not present

## 2015-05-20 LAB — GLUCOSE, CAPILLARY: Glucose-Capillary: 134 mg/dL — ABNORMAL HIGH (ref 65–99)

## 2015-05-20 NOTE — Telephone Encounter (Signed)
LVM for pt to return call to inform her of below. Katharina Caper, April D, [place]

## 2015-05-20 NOTE — Telephone Encounter (Signed)
Please tell [name] that she should be taking both the ciprofloxacin and the doxycycline for the 14 day course. Thank you so much. -Dr. Juleen China

## 2015-05-23 ENCOUNTER — Telehealth: Payer: Self-pay | Admitting: Internal Medicine

## 2015-05-23 NOTE — Telephone Encounter (Signed)
Anne from [facility] called and would like the patient to participate in the hospital follow up program. She thinks it would benefit the patient. If you have any questions please call her at [phone]. jw

## 2015-05-25 NOTE — Patient Outreach (Signed)
[facility]) Care Management  05/25/2015  CAPRICE MCCAFFREY [birth year] [id number]   Referral from [place], assigned to Mariann Laster, RN for patient outreach.  Damita L. Rhodie, [place] Care Management Assistant

## 2015-05-26 ENCOUNTER — Telehealth: Payer: Self-pay | Admitting: Hematology

## 2015-05-26 NOTE — Telephone Encounter (Signed)
pt called to r/s appt...dont...pt aware of new time

## 2015-05-27 DIAGNOSIS — I83893 Varicose veins of bilateral lower extremities with other complications: Secondary | ICD-10-CM | POA: Diagnosis not present

## 2015-05-27 DIAGNOSIS — S76221S Laceration of adductor muscle, fascia and tendon of right thigh, sequela: Secondary | ICD-10-CM | POA: Diagnosis not present

## 2015-05-27 DIAGNOSIS — T8133XD Disruption of traumatic injury wound repair, subsequent encounter: Secondary | ICD-10-CM | POA: Diagnosis not present

## 2015-05-27 LAB — GLUCOSE, CAPILLARY: Glucose-Capillary: 174 mg/dL — ABNORMAL HIGH (ref 65–99)

## 2015-05-31 ENCOUNTER — Other Ambulatory Visit: Payer: Self-pay

## 2015-05-31 DIAGNOSIS — E1169 Type 2 diabetes mellitus with other specified complication: Principal | ICD-10-CM

## 2015-05-31 DIAGNOSIS — IMO0002 Reserved for concepts with insufficient information to code with codable children: Secondary | ICD-10-CM

## 2015-05-31 DIAGNOSIS — E1165 Type 2 diabetes mellitus with hyperglycemia: Secondary | ICD-10-CM

## 2015-05-31 NOTE — Patient Outreach (Signed)
[facility]) Care Management  05/31/2015  Anita [name] 04-Jul-1946 [id number]   Telephonic Care Management Note  Referral Date:  05/25/15 Referral Source:  [place] Referral Issue:  HF, COPD and DM (See Referral under Media Tab)  Outreach call #1 to patient.  Patient reached.  [facility] Screening completed.   Admissions:  2 over the past 6 months ER:  0 Insurance:  [employer]  PCP:  Dr. Phill Myron - next appt 06/03/2015. [facility]- weekly appt's.  Cardiologist: Dr. Minus Breeding - last appt about 1 month ago - next appt 09/2015 Oncologist:  Dr. Annamaria Boots  - next appt 06/06/16 PCS Services:  Yes (Medicaid Service)   CNA comes 5 days a week 3 hours per day. (Service Provider:  Unknown).  Social:   Patient lives in her home with Husband. Patient states she ambulates just fine and does not need a cane or walker.  Falls:  1 with wound to leg.  Caregiver:  Husband Transportation:  Scientist, clinical (histocompatibility and immunogenetics).   DME:  Cane, walker, W/C, home Oxygen 3L/m via [place] ([employer]).    [facility]N Conditions:  DM, HF, COPD H/o PMH: breast cancer with spinal mets, COPD, DM, HLD, CAD, CKD3 (baseline SCr 1.6), HF Patient states she does not check her blood sugars.  States her daughter is a [employer] manager and will check her BS on occasion.  States her DM is managed by diet only.  Patient states her BS reading today was over 200 and her BS has been elevated to 170's recently and she is not sure why; states normally runs in the 130's.   Glucometer provided to patient at time of SNF/Rehab discharge about 2 years ago.  (Type- unknown).    Chronic wound of right LE s/p fall approximately 04/11/15.  69 yo F presented to the ED with sepsis secondary to RLE cellulitis and open wound vs UTI. Patient states she is followed by the [facility].  States she continues to wear a boot. Admits to pain "at times". Patient states  she stopped taking the ciprofloxacin  because the [facility] discontinued.  States she stopped the doxycycline because "it was hurting her".    Pain  Yes - intermittent associated to wound right LE.   Medications Patient unable to verify # of meds she takes but thinks less than 10.   RN CM reviewed with patient and patient is taking more than 10 meds.  Patient confirmed she did not complete antibiotics post hospital discharge.  Patient states she stopped taking the ciprofloxacin  because the Wound Clinic discontinued.  States she stopped the doxycycline because "it was hurting her".  Patient states she is unable to fill her Potassium prescription.  States local pharmacy advised she could not fill again until the 14th.  Patient states she is  not sure why.  States she was also unable to get the prescription filled with [facility] mail order and was advised she needs a new prescription order.  Patient states she is able to afford her medications and getting all medication refills other than the issue with the Potassium.    Consent: Patient agreed to [facility] services.   Plan:   RN CM advised to please notify MD of any changes in condition prior to scheduled appt's.   RN CM provided contact name and #, 24-hour nurse line # [phone].   RN CM confirmed patient is aware of 911 services  for urgent emergency needs. RN CM advised in next follow-call within 10 days with Teleph[facility] RN CM.  RN CM sent [place] RN CM referral 05/31/2015.  RN CM sent [facility] pharmacy referral 05/31/2015.  RN CM notified [place] Assistant: case opened.    Mariann Laster, RN, BSN, [facility], CCM  [employer] Management Coordinator [phone] Direct [phone] Cell [phone] Office [phone] Fax

## 2015-05-31 NOTE — Telephone Encounter (Signed)
Pt informed of below. Zimmerman Rumple, Danielys Madry D, CMA  

## 2015-06-01 NOTE — Patient Outreach (Signed)
[facility]) Care Management  06/01/2015  Anita [name] [birth year] [id number]   Request from Mariann Laster, RN to assign Pharmacy and Community RN, assigned [name], PharmD and Thea Silversmith, RN.  Thanks, Ronnell Freshwater. [place], [place] Assistant Phone: [phone] Fax: [phone]

## 2015-06-02 ENCOUNTER — Encounter (HOSPITAL_BASED_OUTPATIENT_CLINIC_OR_DEPARTMENT_OTHER): Payer: Commercial Managed Care - HMO | Attending: Internal Medicine

## 2015-06-02 ENCOUNTER — Other Ambulatory Visit: Payer: Self-pay | Admitting: *Deleted

## 2015-06-02 DIAGNOSIS — T8133XD Disruption of traumatic injury wound repair, subsequent encounter: Secondary | ICD-10-CM | POA: Insufficient documentation

## 2015-06-02 DIAGNOSIS — I872 Venous insufficiency (chronic) (peripheral): Secondary | ICD-10-CM | POA: Diagnosis not present

## 2015-06-02 DIAGNOSIS — W19XXXD Unspecified fall, subsequent encounter: Secondary | ICD-10-CM | POA: Diagnosis not present

## 2015-06-02 DIAGNOSIS — L97821 Non-pressure chronic ulcer of other part of left lower leg limited to breakdown of skin: Secondary | ICD-10-CM | POA: Diagnosis not present

## 2015-06-02 DIAGNOSIS — R600 Localized edema: Secondary | ICD-10-CM | POA: Diagnosis not present

## 2015-06-02 MED ORDER — POTASSIUM CHLORIDE CRYS ER 20 MEQ PO TBCR: 40.0000 meq | EXTENDED_RELEASE_TABLET | Freq: Two times a day (BID) | ORAL | Status: DC

## 2015-06-03 ENCOUNTER — Encounter: Payer: Self-pay | Admitting: Internal Medicine

## 2015-06-03 ENCOUNTER — Other Ambulatory Visit: Payer: Self-pay

## 2015-06-03 ENCOUNTER — Ambulatory Visit (INDEPENDENT_AMBULATORY_CARE_PROVIDER_SITE_OTHER): Payer: Commercial Managed Care - HMO | Admitting: Internal Medicine

## 2015-06-03 VITALS — BP 126/64 | HR 96 | Temp 97.5°F | Ht 64.0 in | Wt 233.0 lb

## 2015-06-03 DIAGNOSIS — L0390 Cellulitis, unspecified: Secondary | ICD-10-CM

## 2015-06-03 DIAGNOSIS — J410 Simple chronic bronchitis: Secondary | ICD-10-CM

## 2015-06-03 NOTE — Patient Instructions (Addendum)
It was great seeing you today!   1. Please make an appointment with Pharmacy (Dr. Valentina Lucks) on your way out for Pulmonary Function Testing.  2. Please keep going to your wound care visits.    Please bring all your medications to every doctors visit  Sign up for My Chart to have easy access to your labs results, and communication with your Primary care physician.  Next Appointment  Please call to make an appointment with Dr. Juleen China in 2 months    I look forward to talking with you again at our next visit. If you have any questions or concerns before then, please call the clinic at [phone].  Take Care,   Dr. Phill Myron

## 2015-06-03 NOTE — Patient Outreach (Signed)
[place] [facility]) Care Management  06/03/2015  KELISSA MERLIN 07-Jun-1946 [id number]   Assessment: referral received from telephonic care coordinator for community care coordination. RNCM called and spoke with member. Mrs. Paquette reports she saw her primary care physician today. Member states she is on oxygen at 3l/. RNCM reinforced office visit instructions per primary care. Member stated she called to make an appointment with pulmonologist, but the next available is January so she did not schedule the appointment. RNCM encouraged member to call back and schedule an appointment-suggested member see if there is a Librarian, academic or nurse practitioner in the office who can see her. Member stated she would call back and schedule an appointment.   Plan: Home visit next week to complete initial assessment.  Thea Silversmith, RN, MSN, [place] Coordinator Cell: [phone]

## 2015-06-03 NOTE — Progress Notes (Signed)
Subjective:    Patient ID: Anita [name], female    DOB: 06/26/1946, 69 y.o.   MRN: [id number]  HPI  CC: Hospital Follow-Up   Patient here for hospital follow up s/p admission for cellulitis of RLE in the setting of recent fall with chronic wound and PVD. Patient is followed by wound care and had a unna boot dressing placed at discharge. She was discharged with Doxycycline and Ciprofloxacin (14 day courses) after receiving 48 hours of IV abx. She reports that she only took 3 days of Doxycycline and none of the Ciprofloxacin. Reports that wound care states that infection appears that it has cleared, but that she still has a gash on her leg. Denies increased swelling of the right lower leg. Denies pain to the area, denies odorous discharge, and denies erythema surrounding the cut.   Does report that she is having an increase in SOB with walking and requires 4L O2 when ambulating. Does not get SOB while at rest. PMH significant for COPD and she is a former smoker, quit about 2 years ago. Has an albuterol inhaler at home, but does not use it frequently. Reports that she used to use Spiriva but that she stopped because she didn't feel like it was working.   Review of Systems   See HPI for ROS. All other systems reviewed and are negative.  Past medical history, surgical, family, and social history reviewed and updated in the EMR as appropriate. Objective:  BP 126/64 mmHg  Pulse 96  Temp(Src) 97.5 F (36.4 C) (Oral)  Ht 5\' 4"  (1.626 m)  Wt 233 lb (105.688 kg)  BMI 39.97 kg/m2  SpO2 97%  Vitals and nursing note reviewed Gen: Overweight female in NAD  Heart: RRR. No murmurs appreciated.  Lungs: Normal WOB on 4L O2 by [place]. Faint wheezes appreciated in upper lungs. No crackles at bases. MSK: Unna boot in place on RLE. Bandage is clean and dry. 2+ bilateral LE edema. No increased warmth appreciated through dressing.     Assessment & Plan:  COPD (chronic obstructive pulmonary disease) O2  saturation stable today on 4L O2 via [place]. Recommended patient use her albuterol inhaler as rescue medication with exertion. Have asked patient to schedule appointment with pharmacy for PFTs to determine the severity of her COPD. Believe she could benefit from a long-acting daily regimen, but patient is not very interested in starting a new medication. Perhaps seeing PFT results would help in her decision making process.   Wound cellulitis Per patient's report, wound is healing well. She would not let me unwrap the dressing, but did not see any concerning signs on exam. Reassured that patient is followed closely by wound care weekly. If she did take 3 days of doxycycline, it is probable that she received an adequate 5 day course of abx for cellulitis when considering the 48 hrs of IV abx she received while hospitalized. Will need continued close monitoring as she is at risk for poor healing 2/2 to her PVD and diabetes.

## 2015-06-03 NOTE — Assessment & Plan Note (Signed)
O2 saturation stable today on 4L O2 via [place]. Recommended patient use her albuterol inhaler as rescue medication with exertion. Have asked patient to schedule appointment with pharmacy for PFTs to determine the severity of her COPD. Believe she could benefit from a long-acting daily regimen, but patient is not very interested in starting a new medication. Perhaps seeing PFT results would help in her decision making process.

## 2015-06-03 NOTE — Assessment & Plan Note (Signed)
Per patient's report, wound is healing well. She would not let me unwrap the dressing, but did not see any concerning signs on exam. Reassured that patient is followed closely by wound care weekly. If she did take 3 days of doxycycline, it is probable that she received an adequate 5 day course of abx for cellulitis when considering the 48 hrs of IV abx she received while hospitalized. Will need continued close monitoring as she is at risk for poor healing 2/2 to her PVD and diabetes.

## 2015-06-07 ENCOUNTER — Other Ambulatory Visit (HOSPITAL_BASED_OUTPATIENT_CLINIC_OR_DEPARTMENT_OTHER): Payer: Commercial Managed Care - HMO

## 2015-06-07 ENCOUNTER — Telehealth: Payer: Self-pay | Admitting: Hematology

## 2015-06-07 ENCOUNTER — Ambulatory Visit: Payer: Medicare HMO

## 2015-06-07 ENCOUNTER — Ambulatory Visit: Payer: Medicare HMO | Admitting: Hematology

## 2015-06-07 ENCOUNTER — Encounter: Payer: Self-pay | Admitting: Hematology

## 2015-06-07 ENCOUNTER — Ambulatory Visit (HOSPITAL_BASED_OUTPATIENT_CLINIC_OR_DEPARTMENT_OTHER): Payer: Commercial Managed Care - HMO | Admitting: Hematology

## 2015-06-07 ENCOUNTER — Ambulatory Visit (HOSPITAL_BASED_OUTPATIENT_CLINIC_OR_DEPARTMENT_OTHER): Payer: Commercial Managed Care - HMO

## 2015-06-07 ENCOUNTER — Other Ambulatory Visit: Payer: Medicare HMO

## 2015-06-07 VITALS — BP 122/53 | HR 84 | Temp 98.3°F | Resp 18 | Ht 64.0 in | Wt 233.8 lb

## 2015-06-07 DIAGNOSIS — C7951 Secondary malignant neoplasm of bone: Secondary | ICD-10-CM

## 2015-06-07 DIAGNOSIS — C50911 Malignant neoplasm of unspecified site of right female breast: Secondary | ICD-10-CM

## 2015-06-07 DIAGNOSIS — D638 Anemia in other chronic diseases classified elsewhere: Secondary | ICD-10-CM

## 2015-06-07 DIAGNOSIS — C50919 Malignant neoplasm of unspecified site of unspecified female breast: Secondary | ICD-10-CM

## 2015-06-07 LAB — COMPREHENSIVE METABOLIC PANEL (CC13)
ALBUMIN: 3.3 g/dL — AB (ref 3.5–5.0)
ALK PHOS: 96 U/L (ref 40–150)
ALT: 38 U/L (ref 0–55)
AST: 40 U/L — ABNORMAL HIGH (ref 5–34)
Anion Gap: 11 mEq/L (ref 3–11)
BUN: 18.1 mg/dL (ref 7.0–26.0)
CALCIUM: 8.5 mg/dL (ref 8.4–10.4)
CO2: 28 mEq/L (ref 22–29)
Chloride: 103 mEq/L (ref 98–109)
Creatinine: 1.5 mg/dL — ABNORMAL HIGH (ref 0.6–1.1)
EGFR: 37 mL/min/{1.73_m2} — AB (ref >90)
GLUCOSE: 215 mg/dL — AB (ref 70–140)
POTASSIUM: 3.2 meq/L — AB (ref 3.5–5.1)
SODIUM: 142 meq/L (ref 136–145)
Total Bilirubin: 0.6 mg/dL (ref 0.20–1.20)
Total Protein: 6.6 g/dL (ref 6.4–8.3)

## 2015-06-07 LAB — CBC & DIFF AND RETIC
BASO%: 0.4 % (ref 0.0–2.0)
Basophils Absolute: 0 10*3/uL (ref 0.0–0.1)
EOS ABS: 0 10*3/uL (ref 0.0–0.5)
EOS%: 1.1 % (ref 0.0–7.0)
HCT: 27.2 % — ABNORMAL LOW (ref 34.8–46.6)
HEMOGLOBIN: 8.8 g/dL — AB (ref 11.6–15.9)
Immature Retic Fract: 25 % — ABNORMAL HIGH (ref 1.60–10.00)
LYMPH%: 11 % — AB (ref 14.0–49.7)
MCH: 29.7 pg (ref 25.1–34.0)
MCHC: 32.4 g/dL (ref 31.5–36.0)
MCV: 91.9 fL (ref 79.5–101.0)
MONO#: 0.3 10*3/uL (ref 0.1–0.9)
MONO%: 9.2 % (ref 0.0–14.0)
NEUT%: 78.3 % — ABNORMAL HIGH (ref 38.4–76.8)
NEUTROS ABS: 2.1 10*3/uL (ref 1.5–6.5)
Platelets: 160 10*3/uL (ref 145–400)
RBC: 2.96 10*6/uL — ABNORMAL LOW (ref 3.70–5.45)
RDW: 19.7 % — ABNORMAL HIGH (ref 11.2–14.5)
Retic %: 2.56 % — ABNORMAL HIGH (ref 0.70–2.10)
Retic Ct Abs: 75.78 10*3/uL (ref 33.70–90.70)
WBC: 2.7 10*3/uL — AB (ref 3.9–10.3)
lymph#: 0.3 10*3/uL — ABNORMAL LOW (ref 0.9–3.3)

## 2015-06-07 MED ORDER — POTASSIUM CHLORIDE CRYS ER 20 MEQ PO TBCR: 20.0000 meq | EXTENDED_RELEASE_TABLET | Freq: Two times a day (BID) | ORAL | Status: DC

## 2015-06-07 MED ORDER — DENOSUMAB 120 MG/1.7ML ~~LOC~~ SOLN
120.0000 mg | Freq: Once | SUBCUTANEOUS | Status: AC
  Administered 2015-06-07: 120 mg via SUBCUTANEOUS
  Filled 2015-06-07: qty 1.7

## 2015-06-07 MED ORDER — POTASSIUM CHLORIDE CRYS ER 20 MEQ PO TBCR
40.0000 meq | EXTENDED_RELEASE_TABLET | Freq: Two times a day (BID) | ORAL | Status: DC
Start: 2015-06-07 — End: 2015-11-08

## 2015-06-07 NOTE — Progress Notes (Signed)
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 6 mm groundglass nodule in the right upper lobe of lung.   10/10/2012 Initial Biopsy Right breast core needle biopsy showed invasive lobular carcinoma, grade 2. Left iliac crest bone biopsy showed metastatic carcinoma, morphology similar to breast biopsy.   10/14/2012 Initial Diagnosis Breast cancer metastasized to bone ([place])   11/2012 -  Anti-estrogen oral therapy Letrozole $RemoveBeforeDEI'1mg'GAdORieiIsprZkpS$  daily, add palbociclib on 01/01/2014, dose modified to 2 weeks on and 1 week off with cycle 3   02/02/2014 - 02/24/2014 Radiation Therapy Palliative radiation to left and the right femur, 30 gray in 10 fractions, by Dr. Sondra Come    09/01/2014 Imaging CT chest abdomen and pelvis showed several small pulmonary nodules are not changed. Stable widespread bone metastasis.   10/10/2014 Receptors her2 ER 100% positive, PR negative, HER-2 negative.   02/08/2015 Imaging PET scan showed a stable 8 mm right level III lymph node with SUV 8.8, no new neck adenopathy, stable diffuse osseous metastasis, stable for many nodules.     PREVIOUS AND CURRENT THERAPY:   1. She started letrozole in 11/2012.  Added Palbociclib 125 mg daily for 21 days on and 7 days off on 01/01/2014 with disease progression. Dose modified to 2 weeks on 1 week off with cycle 3.   2. zometa added on 01/01/2014 for metastatic bone disease. Zometa held due to renal insufficiency and changed to [facility] because of skeletal disease progression and safe renal profile. 3. Started palliative XRT to the left and right femur  (30 Gy in 10 fractions) on 02/02/2014 per Dr. Gery Pray which was finished on 02/24/2014.  INTERVAL HISTORY:  Anita [name] 69 y.o. female with a history of right breast cancer with metastases to the bone/lungs here for follow-up.   She is clinically stable, no new complains. Her right lower extremity wound has been healing well, now 6 x 4 mm, per patient. No other open wounds. She has stable mild to moderate fatigue, on nasal cannula oxygen, comes in with a wheelchair. She still goes to school, and remains to be active.  MEDICAL HISTORY: Past Medical History  Diagnosis Date  . Fibroid   . Morbid obesity ([place])   . 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 COPD (chronic obstructive pulmonary disease) ([place])   . Chronic diastolic CHF (congestive heart failure) ([place])   . NSTEMI (non-ST elevated myocardial infarction) [facility])     Cath November 2014 LAD 40% stenosis, first diagonal 80% stenosis, circumflex 20% stenosis, right coronary artery occluded. The EF was 35-40% at that time.  . Anemia     a. Adm 09/2012 with melena, Hgb 5.8 -> transfused. EGD/colonoscopy unrevealing.  . Breast cancer ([place])     a. Mets to bone. ER 100%/ PR 0%/Her2 neu negative.  [name] Ulcers of both lower legs ([place])   . Tobacco abuse   . Hyperlipidemia 02/11/2013  . Chronic respiratory failure ([place])     a. On O2 qhs. also portable O2.  . Chronic renal insufficiency   .  Lesion of vocal cord     a. CT 05/2013 concerning for tumor.  . Shortness of breath   . Depression   . Diabetes mellitus without complication ([place])   . Anginal pain ([place])   . Dementia     very mild  . Radiation 02/02/14-02/24/14    left and right femur 30 gray    ALLERGIES:  is allergic to omnipaque and benzene.  MEDICATIONS:    Medication List       This list is accurate as of: 06/07/15 10:46 PM.  Always use your most recent med list.               albuterol 108 (90 BASE) MCG/ACT inhaler  Commonly known  as:  PROVENTIL HFA;VENTOLIN HFA  Inhale 2 puffs into the lungs every 6 (six) hours as needed for wheezing or shortness of breath.     aspirin 81 MG EC tablet  Take 1 tablet (81 mg total) by mouth daily.     atorvastatin 40 MG tablet  Commonly known as:  LIPITOR  Take 40 mg by mouth daily.     bag balm Oint ointment  Apply 1 g topically as needed for dry skin or irritation.     clotrimazole 1 % cream  Commonly known as:  LOTRIMIN  Apply 1 application topically daily.     docusate sodium 100 MG capsule  Commonly known as:  COLACE  Take 100 mg by mouth daily.     feeding supplement (PRO-STAT SUGAR FREE 64) Liqd  Take 30 mLs by mouth 3 (three) times daily with meals.     isosorbide mononitrate 60 MG 24 hr tablet  Commonly known as:  IMDUR  Take 1 tablet (60 mg total) by mouth daily.     letrozole 2.5 MG tablet  Commonly known as:  FEMARA  Take 1 tablet (2.5 mg total) by mouth daily.     mirtazapine 15 MG tablet  Commonly known as:  REMERON  Take 1 tablet (15 mg total) by mouth at bedtime.     nitroGLYCERIN 0.4 MG SL tablet  Commonly known as:  NITROSTAT  Place 1 tablet (0.4 mg total) under the tongue every 5 (five) minutes as needed for chest pain.     NON FORMULARY  Place 3 L into the nose as needed (oxygen).     palbociclib 125 MG capsule  Commonly known as:  IBRANCE  Take 1 capsule (125 mg total) by mouth See admin instructions. Take 125mg  every morning for 2 weeks on,  1 week off .     potassium chloride SA 20 MEQ tablet  Commonly known as:  K-DUR,KLOR-CON  Take 2 tablets (40 mEq total) by mouth 2 (two) times daily.     torsemide 20 MG tablet  Commonly known as:  DEMADEX  Take 20-80 mg by mouth See admin instructions. Daughter states she takes 80mg  in the morning, then take 40mg  in the afternoon.     VITAMIN C PO  Take 500 mg by mouth daily.     VITAMIN D (CHOLECALCIFEROL) PO  Take 1,000 mg by mouth daily.        SURGICAL HISTORY:  Past Surgical  History  Procedure Laterality Date  . Colonoscopy, esophagogastroduodenoscopy (egd) and esophageal dilation N/A 10/13/2012    Procedure: colonoscopy and egd;  Surgeon: Wonda Horner, MD;  Location: [facility];  Service: Endoscopy;  Laterality: N/A;  . Left heart catheterization with coronary angiogram N/A 07/07/2013    Procedure: LEFT  HEART CATHETERIZATION WITH CORONARY ANGIOGRAM;  Surgeon: Peter M Martinique, MD;  Location: [facility] CATH LAB;  Service: Cardiovascular;  Laterality: N/A;   REVIEW OF SYSTEMS:   Constitutional: Denies fevers, chills or abnormal weight loss Eyes: Denies blurriness of vision Ears, nose, mouth, throat, and face: Denies mucositis or sore throat Respiratory: Denies cough, (+) stable dyspnea, no wheezes Cardiovascular: Denies palpitation, chest discomfort or lower extremity swelling Gastrointestinal:  Denies nausea, heartburn or change in bowel habits Skin: Denies abnormal skin rashes Lymphatics: Denies new lymphadenopathy or easy bruising Neurological:Denies numbness, tingling or new weaknesses Behavioral/Psych: Mood is stable, no new changes  All other systems were reviewed with the patient and are negative.  PHYSICAL EXAMINATION: ECOG PERFORMANCE STATUS: 2-3  Blood pressure 122/53, pulse 84, temperature 98.3 F (36.8 C), temperature source Oral, resp. rate 18, height $RemoveBe'5\' 4"'iPgKUbQWX$  (1.626 m), weight 233 lb 12.8 oz (106.051 kg), SpO2 100 %.  GENERAL:alert, no distress and comfortable morbidly obese woman in wheelchair, chronically ill appearing SKIN: skin color, texture, turgor are normal; posterior legs with irritation and erythema with a small abrasion on the posterior left thigh with mild TTP.  EYES: normal, Conjunctiva are pink and non-injected, sclera clear OROPHARYNX:no exudate, no erythema and lips, buccal mucosa, and tongue normal  NECK: supple, thyroid normal size, non-tender, without nodularity LYMPH:  no palpable lymphadenopathy in the cervical, axillary LUNGS:  coarse breathing with slightly increased breathing rate HEART: regular rate & rhythm and no murmurs and 2+ lower extremity edema BREAST: deferred ABDOMEN:abdomen soft, non-tender and normal bowel sounds; obese Musculoskeletal:no cyanosis of digits and no clubbing; bilateral lower extremities are wrapped with gauze and a bandage up to mid calf. I can not see the wound in the right anterior tibia.  NEURO: alert & oriented x 3 with fluent speech, no focal motor/sensory deficits  LABORATORY DATA: CBC Latest Ref Rng 06/07/2015 05/15/2015 05/14/2015  WBC 3.9 - 10.3 10e3/uL 2.7(L) 3.0(L) 2.9(L)  Hemoglobin 11.6 - 15.9 g/dL 8.8(L) 7.8(L) 8.3(L)  Hematocrit 34.8 - 46.6 % 27.2(L) 24.1(L) 26.4(L)  Platelets 145 - 400 10e3/uL 160 182 162    CMP Latest Ref Rng 06/07/2015 05/15/2015 05/14/2015  Glucose 70 - 140 mg/dl 215(H) 144(H) 186(H)  BUN 7.0 - 26.0 mg/dL 18.1 31(H) 36(H)  Creatinine 0.6 - 1.1 mg/dL 1.5(H) 1.93(H) 2.11(H)  Sodium 136 - 145 mEq/L 142 139 137  Potassium 3.5 - 5.1 mEq/L 3.2(L) 3.8 3.5  Chloride 101 - 111 mmol/L - 110 103  CO2 22 - 29 mEq/L 28 20(L) 21(L)  Calcium 8.4 - 10.4 mg/dL 8.5 7.8(L) 7.4(L)  Total Protein 6.4 - 8.3 g/dL 6.6 - -  Total Bilirubin 0.20 - 1.20 mg/dL 0.60 - -  Alkaline Phos 40 - 150 U/L 96 - -  AST 5 - 34 U/L 40(H) - -  ALT 0 - 55 U/L 38 - -   PATHOLOGY REPORT  Diagnosis 10/10/2014 1. Bone, biopsy, left iliac crest - METASTATIC MAMMARY CARCINOMA, SEE COMMENT. 2. Breast, right, needle core biopsy - INVASIVE MAMMARY CARCINOMA, SEE COMMENT. Microscopic Comment 1. and 2. The breast biopsy demonstrates definitive morphologic features of invasive mammary carcinoma. Although the grade of tumor is best assessed at resection, with these biopsies, the carcinoma is grade II. An E-Cadherin stain is pending and will be reported as an addendum. The morphology of the metastatic carcinoma in part 2 is identical to the breast biopsy. Breast prognostic studies are pending  (part 2) and will be reported as an addendum. The case was reviewed with  Dr. Avis Epley who concurs. The case was discussed with Dr. Lamonte Sakai on 10/13/2012.  RADIOGRAPHIC STUDIES:  CT chest abdomen and pelvis 09/01/2014 IMPRESSION: Chest Impression:  1. Stable exam of thorax. 2. Several small smudgy pulmonary nodules are not changed. 3. Stable widespread bony metastasis pneumothorax.  Abdomen / Pelvis Impression:  1. Stable small periaortic retroperitoneal lymph node. 2. No evidence disease progression. 3. Stable widespread skeletal metastasis. 4. Left sided staghorn calculus again demonstrated. 5. Nodular focus in the right kidney at site of prior renal cyst is not changed  PET 02/08/2015 IMPRESSION: 1. Stable 8 mm right level 3 lymph node which is now more metabolically active with SUV max of 8.8 (previously 2.9). No new neck adenopathy. 2. Stable diffuse osseous metastatic disease. 3. Stable pulmonary nodules. No progressive findings. 4. No findings for metastatic disease involving the abdomen/pelvis.   ASSESSMENT: Metastatic breast cancer on palliative letrozole plus Palbociclib and Xgeva for bone mets.   PLAN:  1.  Metastatic breast cancer (ER pos/PR neg/ HER2 neg) with metastasis to bones and lungs.  --She is clinically stable, metastatic bone pain much improved after palliating of radiation. -I reviewed her restaging PET scan from 02/08/2015, which showed overall stable disease, right level III cervical lymph node slightly more hypermetabolic, with stable small size, no other new lesions. - She is tolerating treatment well. Mild neutropenia and moderate anemia likely related to her cancer and palbo -Continue letrozole and Palbociclib 125 mg daily, 2 week on, 1 week off. We'll follow up his CBC closely. -Lab reviewed, mild leukopenia, ANC 1.9,. She is going to restart Ibrance next Monday 10/17 -Her right lower extremity wound has been healing well, so no need to hold her  Ibrance for now  -will repeat restaging PET in one month  2. bone metastasis - continue Xgeva monthly  -Continue calcium and vitamin D  3. 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   --She has been seen by radiation oncology and followed by Dr. Sondra Come.  -She never had any biopsy, not sure how the diagnosed was made, she follows up with Dr. Sondra Come, who does not think she need radiation.   5. Leukopenia/Anemia of chronic disease  -- Past work up for reversible causes of anemia were negative.  -Stable moderate anemia, her hemoglobin is 8.8 today. We'll hold on transfusion, continue monitoring. -Repeated ferritin 255, serum iron 39 and saturation 14% in 11/2014 -She is not taking iron pill, could not tolerate.   6. CHF --She is not fluid overloaded today on exam. She is on Lasix, nitrogylerin prn, losartan. Increase lasix prn and weight daily. Cards follow up.   7. Diabetes mellitus, type II. Diet controlled.   8. COPD: On albuterol prn. She remains on home oxygen.  9. Chronic renal insufficiency.  -Stable.  10: Bilateral leg wound -Worsening right lower extremity wound after her fall in August -She still follows up with his wound clinic once a week with dressing change -her right LE wound is healing well    All questions were answered. The patient knows to call the clinic with any problems, questions or concerns. We can certainly see the patient much sooner if necessary.  Plan: -RTC in 1 month with lab and restaging PET. Continue monthly xgeva injection monthly  -Start Ibrance next Monday, continue letrozole daily -She declined a flu shot due to her allergy to egg   I spent 25 minutes counseling the patient face to face. The total time spent in the appointment was 30 minutes.  Truitt Merle  06/07/2015

## 2015-06-07 NOTE — Telephone Encounter (Signed)
Gave adn printed appt sched and avs fo rpt for NOV and DEC...the patient wanted to moveall appt to wednesday

## 2015-06-07 NOTE — Patient Instructions (Signed)
Denosumab injection  What is this medicine?  DENOSUMAB (den oh sue mab) slows bone breakdown. Prolia is used to treat osteoporosis in women after menopause and in men. Xgeva is used to prevent bone fractures and other bone problems caused by cancer bone metastases. Xgeva is also used to treat giant cell tumor of the bone.  This medicine may be used for other purposes; ask your health care provider or pharmacist if you have questions.  What should I tell my health care provider before I take this medicine?  They need to know if you have any of these conditions:  -dental disease  -eczema  -infection or history of infections  -kidney disease or on dialysis  -low blood calcium or vitamin D  -malabsorption syndrome  -scheduled to have surgery or tooth extraction  -taking medicine that contains denosumab  -thyroid or parathyroid disease  -an unusual reaction to denosumab, other medicines, foods, dyes, or preservatives  -pregnant or trying to get pregnant  -breast-feeding  How should I use this medicine?  This medicine is for injection under the skin. It is given by a health care professional in a hospital or clinic setting.  If you are getting Prolia, a special MedGuide will be given to you by the pharmacist with each prescription and refill. Be sure to read this information carefully each time.  For Prolia, talk to your pediatrician regarding the use of this medicine in children. Special care may be needed. For Xgeva, talk to your pediatrician regarding the use of this medicine in children. While this drug may be prescribed for children as young as 13 years for selected conditions, precautions do apply.  Overdosage: If you think you have taken too much of this medicine contact a poison control center or emergency room at once.  NOTE: This medicine is only for you. Do not share this medicine with others.  What if I miss a dose?  It is important not to miss your dose. Call your doctor or health care professional if you are  unable to keep an appointment.  What may interact with this medicine?  Do not take this medicine with any of the following medications:  -other medicines containing denosumab  This medicine may also interact with the following medications:  -medicines that suppress the immune system  -medicines that treat cancer  -steroid medicines like prednisone or cortisone  This list may not describe all possible interactions. Give your health care provider a list of all the medicines, herbs, non-prescription drugs, or dietary supplements you use. Also tell them if you smoke, drink alcohol, or use illegal drugs. Some items may interact with your medicine.  What should I watch for while using this medicine?  Visit your doctor or health care professional for regular checks on your progress. Your doctor or health care professional may order blood tests and other tests to see how you are doing.  Call your doctor or health care professional if you get a cold or other infection while receiving this medicine. Do not treat yourself. This medicine may decrease your body's ability to fight infection.  You should make sure you get enough calcium and vitamin D while you are taking this medicine, unless your doctor tells you not to. Discuss the foods you eat and the vitamins you take with your health care professional.  See your dentist regularly. Brush and floss your teeth as directed. Before you have any dental work done, tell your dentist you are receiving this medicine.  Do   not become pregnant while taking this medicine or for 5 months after stopping it. Women should inform their doctor if they wish to become pregnant or think they might be pregnant. There is a potential for serious side effects to an unborn child. Talk to your health care professional or pharmacist for more information.  What side effects may I notice from receiving this medicine?  Side effects that you should report to your doctor or health care professional as soon as  possible:  -allergic reactions like skin rash, itching or hives, swelling of the face, lips, or tongue  -breathing problems  -chest pain  -fast, irregular heartbeat  -feeling faint or lightheaded, falls  -fever, chills, or any other sign of infection  -muscle spasms, tightening, or twitches  -numbness or tingling  -skin blisters or bumps, or is dry, peels, or red  -slow healing or unexplained pain in the mouth or jaw  -unusual bleeding or bruising  Side effects that usually do not require medical attention (Report these to your doctor or health care professional if they continue or are bothersome.):  -muscle pain  -stomach upset, gas  This list may not describe all possible side effects. Call your doctor for medical advice about side effects. You may report side effects to FDA at 1-800-FDA-1088.  Where should I keep my medicine?  This medicine is only given in a clinic, doctor's office, or other health care setting and will not be stored at home.  NOTE: This sheet is a summary. It may not cover all possible information. If you have questions about this medicine, talk to your doctor, pharmacist, or health care provider.      2016, Elsevier/Gold Standard. (2012-02-11 12:37:47)

## 2015-06-08 ENCOUNTER — Other Ambulatory Visit: Payer: Self-pay | Admitting: Pharmacist

## 2015-06-08 LAB — CANCER ANTIGEN 27.29: CA 27.29: 50 U/mL — ABNORMAL HIGH (ref 0–39)

## 2015-06-08 NOTE — Patient Outreach (Signed)
Anita [name] was referred to pharmacy for medication management. Left a HIPAA compliant message on the patient's voicemail. If have not heard from patient within the next week, will give her another call at that time.  [name], PharmD Clinical Pharmacist [facility] [phone]

## 2015-06-09 ENCOUNTER — Other Ambulatory Visit: Payer: Self-pay

## 2015-06-09 DIAGNOSIS — T8133XD Disruption of traumatic injury wound repair, subsequent encounter: Secondary | ICD-10-CM | POA: Diagnosis not present

## 2015-06-09 DIAGNOSIS — L97821 Non-pressure chronic ulcer of other part of left lower leg limited to breakdown of skin: Secondary | ICD-10-CM | POA: Diagnosis not present

## 2015-06-09 DIAGNOSIS — R600 Localized edema: Secondary | ICD-10-CM | POA: Diagnosis not present

## 2015-06-09 DIAGNOSIS — I872 Venous insufficiency (chronic) (peripheral): Secondary | ICD-10-CM | POA: Diagnosis not present

## 2015-06-09 LAB — GLUCOSE, CAPILLARY: Glucose-Capillary: 122 mg/dL — ABNORMAL HIGH (ref 65–99)

## 2015-06-09 NOTE — Patient Outreach (Signed)
[facility]) Care Management  [place]  06/09/2015   Anita [name] 1946/06/01 [id number]  Subjective: Member reports she is active, and going to school three times/week and will be going to school 5 days/week at the end of the month.   Objective: BP 110/60 mmHg  Pulse 90  Resp 24  SpO2 , skin warm dry, lungs sounds decreased,no wheeze, no crackles, dressings to both legs intact.   Current Medications:  Current Outpatient Prescriptions  Medication Sig Dispense Refill  . albuterol (PROVENTIL HFA;VENTOLIN HFA) 108 (90 BASE) MCG/ACT inhaler Inhale 2 puffs into the lungs every 6 (six) hours as needed for wheezing or shortness of breath. 3 Inhaler 3  . Amino Acids-Protein Hydrolys (FEEDING SUPPLEMENT, PRO-STAT SUGAR FREE 64,) LIQD Take 30 mLs by mouth 3 (three) times daily with meals.    . Ascorbic Acid (VITAMIN C PO) Take 500 mg by mouth daily.     [name] aspirin EC 81 MG EC tablet Take 1 tablet (81 mg total) by mouth daily. 30 tablet 3  . atorvastatin (LIPITOR) 40 MG tablet Take 40 mg by mouth daily.    . bag balm OINT ointment Apply 1 g topically as needed for dry skin or irritation. 300 g 11  . clotrimazole (LOTRIMIN) 1 % cream Apply 1 application topically daily. 113 g 3  . docusate sodium (COLACE) 100 MG capsule Take 100 mg by mouth daily.    . isosorbide mononitrate (IMDUR) 60 MG 24 hr tablet Take 1 tablet (60 mg total) by mouth daily. 90 tablet 3  . letrozole (FEMARA) 2.5 MG tablet Take 1 tablet (2.5 mg total) by mouth daily. 90 tablet 2  . mirtazapine (REMERON) 15 MG tablet Take 1 tablet (15 mg total) by mouth at bedtime. 30 tablet 5  . nitroGLYCERIN (NITROSTAT) 0.4 MG SL tablet Place 1 tablet (0.4 mg total) under the tongue every 5 (five) minutes as needed for chest pain. (Patient not taking: Reported on 06/07/2015) 30 tablet 0  . NON FORMULARY Place 3 L into the nose as needed (oxygen).     . palbociclib (IBRANCE) 125 MG capsule Take 1 capsule (125 mg total)  by mouth See admin instructions. Take 125mg  every morning for 2 weeks on,  1 week off . 14 capsule 2  . potassium chloride SA (K-DUR,KLOR-CON) 20 MEQ tablet Take 2 tablets (40 mEq total) by mouth 2 (two) times daily. 120 tablet 6  . torsemide (DEMADEX) 20 MG tablet Take 20-80 mg by mouth See admin instructions. Daughter states she takes 80mg  in the morning, then take 40mg  in the afternoon.    [name] VITAMIN D, CHOLECALCIFEROL, PO Take 1,000 mg by mouth daily.      No current facility-administered medications for this visit.    Functional Status:  In your present state of health, do you have any difficulty performing the following activities: 06/09/2015 05/31/2015  Hearing? N N  Vision? N N  Difficulty concentrating or making decisions? N N  Walking or climbing stairs? Y N  Dressing or bathing? Y Y  Doing errands, shopping? [name]  Preparing Food and eating ? Y Y  Using the Toilet? N -  In the past six months, have you accidently leaked urine? N -  Do you have problems with loss of bowel control? N -  Managing your Medications? N -  Managing your Finances? N -  Housekeeping or managing your Housekeeping? [name]    Fall/Depression Screening: [facility] 2/9 Scores 06/09/2015 06/03/2015  06/03/2015 04/13/2015 04/13/2015 04/01/2015 08/24/2014  PHQ - 2 Score 0 0 0 0 0 0 0   Fall Risk  06/09/2015 06/03/2015 05/31/2015 04/01/2015 03/24/2014  Falls in the past year? Yes No Yes No No  Number falls in past yr: 1 - 1 - -  Injury with Fall? Yes - Yes - -  Risk Factor Category  High Fall Risk - High Fall Risk - -  Risk for fall due to : History of fall(s) - Impaired balance/gait - -  Risk for fall due to (comments): - - wound right lower leg - -  Follow up Education provided;Falls prevention discussed - Falls prevention discussed - -    Assessment: 69 year old with history of diabetes, heart failure, chronic kidney disease, breast cancer and leg cellulitis. Member lives with her husband. 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 Bilateral leg wounds dressings intact and managed by the wound care clinic.  History of COPD- Member expressed this is her main concern. And would like assistance in managing COPD better. Member reports she notices an increase in shortness of breath especially when ambulating outside or walking inclines.  Member uses home oxygen and reports she primarily wears oxygen when she is active out of the house. RNCM called and scheduled appointment with pulmonologist for Tuesday October 18. Member acknowledged appointment date/time and states she has transportation.  History of diabetes, member states her assistant checks her blood sugars regularly, although her glucose meter shows no test in the past 14 days with a 30 day average of 292 with two readings. Glucose meter strips noted to be out of date. Member's blood sugar checked using RNCM's glucose meter-Result 362. Member expressed disbelief that her blood sugar could be in the 300's, stating that something had to be wrong with RNCM's meter. RNCM reassured member that it was only one reading. Reinforced the importance of her getting her updated strips and checking her blood sugars. Member states she is going to call to get more strips.  History of heart failure-member is able to state what signs/symptoms she would call the doctor for. States she weighs herself and does not need assistance with heart failure management.  Plan: home visit in two weeks, update primary care physician.  Thea Silversmith, RN, MSN, [place] Coordinator Cell: [phone]

## 2015-06-10 ENCOUNTER — Other Ambulatory Visit: Payer: Self-pay

## 2015-06-10 NOTE — Patient Outreach (Signed)
[facility]) Care Management  06/10/2015  AMBRIE CARTE 1946/03/31 [id number]  RNCM received call from member speech slurred, stating that [facility] should check her blood sugar meter, stating she had her blood sugar checked at the wound care center and it was 122. Member went on to say that if her blood sugar was that high she would be "sick as a dog", Then member use exploitive to say the meter was not accurate.   This was followed by another voice message speech slurred stating the reading RNCM got scared her and she appreciated everything RNCM was doing for her and that she should have her strips in a few weeks so she can check her own blood sugar. RNCM called to follow up regarding member's concerns. No answer. Unable to leave message.  Plan: home visit scheduled in two weeks  Thea Silversmith, RN, MSN, [place] Coordinator Cell: [phone]

## 2015-06-13 ENCOUNTER — Other Ambulatory Visit: Payer: Self-pay | Admitting: Pharmacist

## 2015-06-13 NOTE — Patient Outreach (Signed)
Anita [name] is a 69 y.o. female referred to pharmacy for medication management related to medication non-adherence. Called and spoke with [name] about her medication management. [name] reports that she uses a pillbox to manage and remember to take her medications. Reports that the pillbox is filled for her by her CNA.   [name] reports that she has no medication questions. Offer to come to meet with [name] to review and discuss her medications. [name] declines, stating that she already knows what they are for. Discuss pharmacy services and offer to discuss medications with patient over the phone as an alternative. However, [name] declines. Patient also reports that she has no medication assistance needs. Provide [name] with my phone number and ask for her to call if she has any medication questions.  Will send an InBasket message to Lurline Del letting her know that patient has declined pharmacy involvement.  [name], PharmD Clinical Pharmacist [facility] [phone]

## 2015-06-14 ENCOUNTER — Encounter: Payer: Self-pay | Admitting: Adult Health

## 2015-06-14 ENCOUNTER — Ambulatory Visit (INDEPENDENT_AMBULATORY_CARE_PROVIDER_SITE_OTHER): Payer: Commercial Managed Care - HMO | Admitting: Adult Health

## 2015-06-14 VITALS — BP 122/64 | HR 94 | Temp 98.5°F | Wt 230.0 lb

## 2015-06-14 DIAGNOSIS — J9611 Chronic respiratory failure with hypoxia: Secondary | ICD-10-CM | POA: Diagnosis not present

## 2015-06-14 DIAGNOSIS — J410 Simple chronic bronchitis: Secondary | ICD-10-CM | POA: Diagnosis not present

## 2015-06-14 DIAGNOSIS — I5032 Chronic diastolic (congestive) heart failure: Secondary | ICD-10-CM | POA: Diagnosis not present

## 2015-06-14 NOTE — Assessment & Plan Note (Signed)
Unable to do spirometry today  Declined flu shot .  Previously tried [employer] and Spiriva but no perceived benefit.   Plan  Continue on current regimen  Low salt diet  Continue on Oxygen .  Follow up Dr. Halford Chessman  In 6 months and As needed

## 2015-06-14 NOTE — Progress Notes (Signed)
Subjective:    Patient ID: Anita [name], female    DOB: 01/11/1946, 69 y.o.   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 Burr Medico    TESTS: Echo 09/25/12 >> grade 2 diastolic dysfunction, mild RV dilation, EF 55% CT chest 10/08/12 >> 6 mm GGO nodule RUL, diffuse sclerotic changes on bone windows ONO with RA 10/23/12 >> Test time 7 hrs 53 min. Mean SpO2 89.3%, low SpO2 66%. Spent 46 min with SpO2 < 88%. CT chest 09/01/14 >> 5 mm RUL nodule, 5 mm LUL nodule, multiple lytic lesions in spine/sternum/shoulder  PMHx >> CAD, Diastolic CHF, HLD, CKD, DM, Depression, Stage IV breast cancer, Stage III laryngeal carcinoma  06/14/2015 Follow up : COPD /O2 dependent RF , metastatic Breast cancer bone/lungs Pt returns for 9 month follow up . She overall she is doing okay. Has good and bad days.  Gets winded easily with walking despite O2.  Tried to spirometry in office but after several attempts pt was unable to do.  Previously tried on several inhalers with Jearl Klinefelter and [place] . Did not think they were very helpful so no longer taking .  Declines flu shot  She denies chest pain, orthopnea, hemoptysis or fever.   PET scan in June w/ no progressive lytic disease. Lung nodules stable /not hypermetabolic.  She is followed by oncology .   Past Medical History  Diagnosis Date  . Fibroid   . Morbid obesity ([place])   . 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 COPD (chronic obstructive pulmonary disease) ([place])   . Chronic diastolic CHF (congestive heart failure) ([place])   . NSTEMI (non-ST elevated myocardial infarction) [facility])     Cath November 2014 LAD 40% stenosis, first diagonal 80% stenosis, circumflex 20% stenosis, right coronary artery occluded. The EF was 35-40% at that time.  . Anemia     a. Adm 09/2012 with melena, Hgb  5.8 -> transfused. EGD/colonoscopy unrevealing.  . Breast cancer ([place])     a. Mets to bone. ER 100%/ PR 0%/Her2 neu negative.  [name] Ulcers of both lower legs ([place])   . Tobacco abuse   . Hyperlipidemia 02/11/2013  . Chronic respiratory failure ([place])     a. On O2 qhs. also portable O2.  . Chronic renal insufficiency   . Lesion of vocal cord     a. CT 05/2013 concerning for tumor.  . Shortness of breath   . Depression   . Diabetes mellitus without complication ([place])   . Anginal pain ([place])   . Dementia     very mild  . Radiation 02/02/14-02/24/14    left and right femur 30 gray     Current Outpatient Prescriptions on File Prior to Visit  Medication Sig Dispense Refill  . albuterol (PROVENTIL HFA;VENTOLIN HFA) 108 (90 BASE) MCG/ACT inhaler Inhale 2 puffs into the lungs every 6 (six) hours as needed for wheezing or shortness of breath. 3 Inhaler 3  . Amino Acids-Protein Hydrolys (FEEDING SUPPLEMENT, PRO-STAT SUGAR FREE 64,) LIQD Take 30 mLs by mouth 3 (three) times daily with meals.    . Ascorbic Acid (VITAMIN C PO) Take 500 mg by mouth daily.     [name] aspirin EC 81 MG EC tablet Take 1 tablet (81 mg total) by mouth daily. 30 tablet 3  .  atorvastatin (LIPITOR) 40 MG tablet Take 40 mg by mouth daily.    . bag balm OINT ointment Apply 1 g topically as needed for dry skin or irritation. 300 g 11  . clotrimazole (LOTRIMIN) 1 % cream Apply 1 application topically daily. 113 g 3  . docusate sodium (COLACE) 100 MG capsule Take 100 mg by mouth daily.    . isosorbide mononitrate (IMDUR) 60 MG 24 hr tablet Take 1 tablet (60 mg total) by mouth daily. 90 tablet 3  . letrozole (FEMARA) 2.5 MG tablet Take 1 tablet (2.5 mg total) by mouth daily. 90 tablet 2  . mirtazapine (REMERON) 15 MG tablet Take 1 tablet (15 mg total) by mouth at bedtime. 30 tablet 5  . nitroGLYCERIN (NITROSTAT) 0.4 MG SL tablet Place 1 tablet (0.4 mg total) under the tongue every 5 (five) minutes as needed for chest pain. 30 tablet 0  . NON  FORMULARY Place 3 L into the nose as needed (oxygen).     . palbociclib (IBRANCE) 125 MG capsule Take 1 capsule (125 mg total) by mouth See admin instructions. Take 125mg  every morning for 2 weeks on,  1 week off . 14 capsule 2  . potassium chloride SA (K-DUR,KLOR-CON) 20 MEQ tablet Take 2 tablets (40 mEq total) by mouth 2 (two) times daily. 120 tablet 6  . torsemide (DEMADEX) 20 MG tablet Take 20-80 mg by mouth See admin instructions. Daughter states she takes 80mg  in the morning, then take 40mg  in the afternoon.    [name] VITAMIN D, CHOLECALCIFEROL, PO Take 1,000 mg by mouth daily.      No current facility-administered medications on file prior to visit.     Review of Systems Constitutional:   No  weight loss, night sweats,  Fevers, chills, + fatigue, or  lassitude.  HEENT:   No headaches,  Difficulty swallowing,  Tooth/dental problems, or  Sore throat,                No sneezing, itching, ear ache, nasal congestion, post nasal drip,   CV:  No chest pain,  Orthopnea, PND, swelling in lower extremities, anasarca, dizziness, palpitations, syncope.   GI  No heartburn, indigestion, abdominal pain, nausea, vomiting, diarrhea, change in bowel habits, loss of appetite, bloody stools.   Resp:  No chest wall deformity  Skin: no rash or lesions.  GU: no dysuria, change in color of urine, no urgency or frequency.  No flank pain, no hematuria   MS:  No joint pain or swelling.  No decreased range of motion.  No back pain.  Psych:  No change in mood or affect. No depression or anxiety.  No memory loss.         Objective:   Physical Exam  GEN: A/Ox3; pleasant , NAD, morbidly obese on O2   HEENT:  [place]/AT,  EACs-clear, TMs-wnl, NOSE-clear, THROAT-clear, no lesions, no postnasal drip or exudate noted.   NECK:  Supple w/ fair ROM; no JVD; normal carotid impulses w/o bruits; no thyromegaly or nodules palpated; no lymphadenopathy.  RESP  Decreased BS in bases w/o, wheezes/ rales/ or rhonchi.no  accessory muscle use, no dullness to percussion  CARD:  RRR, no m/r/g  , 1+  peripheral edema,, leg wraps,  pulses intact, no cyanosis or clubbing.  GI:   Soft & nt; nml bowel sounds; no organomegaly or masses detected.  Musco: Warm bil, no deformities or joint swelling noted.   Neuro: alert, no focal deficits noted.    Skin:  Warm, no lesions or rashes        Assessment & Plan:

## 2015-06-14 NOTE — Assessment & Plan Note (Signed)
Continue on current regimen  Low salt diet  Continue on Oxygen .  Follow up Dr. Halford Chessman  In 6 months and As needed

## 2015-06-14 NOTE — Patient Instructions (Signed)
Continue on current regimen  Low salt diet  Continue on Oxygen .  Follow up Dr. Halford Chessman  In 6 months and As needed

## 2015-06-14 NOTE — Assessment & Plan Note (Signed)
Compensated without flare   Plan  Continue on current regimen  Low salt diet  Continue on Oxygen .  Follow up Dr. Halford Chessman  In 6 months and As needed

## 2015-06-15 NOTE — Progress Notes (Signed)
Reviewed and agree with assessment/plan. 

## 2015-06-16 DIAGNOSIS — I872 Venous insufficiency (chronic) (peripheral): Secondary | ICD-10-CM | POA: Diagnosis not present

## 2015-06-16 DIAGNOSIS — L97821 Non-pressure chronic ulcer of other part of left lower leg limited to breakdown of skin: Secondary | ICD-10-CM | POA: Diagnosis not present

## 2015-06-16 DIAGNOSIS — T8133XD Disruption of traumatic injury wound repair, subsequent encounter: Secondary | ICD-10-CM | POA: Diagnosis not present

## 2015-06-16 DIAGNOSIS — R600 Localized edema: Secondary | ICD-10-CM | POA: Diagnosis not present

## 2015-06-16 LAB — GLUCOSE, CAPILLARY: Glucose-Capillary: 149 mg/dL — ABNORMAL HIGH (ref 65–99)

## 2015-06-17 ENCOUNTER — Other Ambulatory Visit: Payer: Self-pay | Admitting: *Deleted

## 2015-06-17 ENCOUNTER — Telehealth: Payer: Self-pay | Admitting: *Deleted

## 2015-06-17 ENCOUNTER — Other Ambulatory Visit: Payer: Self-pay | Admitting: Hematology

## 2015-06-17 DIAGNOSIS — C7951 Secondary malignant neoplasm of bone: Principal | ICD-10-CM

## 2015-06-17 DIAGNOSIS — C50919 Malignant neoplasm of unspecified site of unspecified female breast: Secondary | ICD-10-CM

## 2015-06-17 NOTE — Telephone Encounter (Signed)
VM message received from p @ 1207 pm inquiring about her re-staging PET scan and when to expect it. She was told at 06/07/15 appt it would be within the month. Please advise.

## 2015-06-17 NOTE — Telephone Encounter (Signed)
Spoke with Anita [name] in radiology scheduling.  Anita [name] stated she would contact pt with appt for PET scan - needs to be done before 07/06/15 follow up appt.

## 2015-06-17 NOTE — Addendum Note (Signed)
Addended by: Truitt Merle on: 06/17/2015 03:05 PM   Modules accepted: Orders

## 2015-06-21 ENCOUNTER — Other Ambulatory Visit: Payer: Self-pay

## 2015-06-21 NOTE — Patient Outreach (Addendum)
[facility]) Care Management  [place]  06/21/2015   Anita [name] Feb 19, 1946 [id number]  Subjective: Reports she went to pulmonary appointment, but does not feel that it has helped because no changes were made.  Objective: BP 122/64 mmHg  Pulse 82  Resp 20  SpO2 95%, lungs decreased, expiratory low pitch wheeze noted, heart rate regular, skin warm, dry, dressings to bilateral legs intact.   Current Medications:  Current Outpatient Prescriptions  Medication Sig Dispense Refill  . albuterol (PROVENTIL HFA;VENTOLIN HFA) 108 (90 BASE) MCG/ACT inhaler Inhale 2 puffs into the lungs every 6 (six) hours as needed for wheezing or shortness of breath. 3 Inhaler 3  . Amino Acids-Protein Hydrolys (FEEDING SUPPLEMENT, PRO-STAT SUGAR FREE 64,) LIQD Take 30 mLs by mouth 3 (three) times daily with meals.    . Ascorbic Acid (VITAMIN C PO) Take 500 mg by mouth daily.     [name] aspirin EC 81 MG EC tablet Take 1 tablet (81 mg total) by mouth daily. 30 tablet 3  . atorvastatin (LIPITOR) 40 MG tablet Take 40 mg by mouth daily.    . bag balm OINT ointment Apply 1 g topically as needed for dry skin or irritation. 300 g 11  . clotrimazole (LOTRIMIN) 1 % cream Apply 1 application topically daily. 113 g 3  . docusate sodium (COLACE) 100 MG capsule Take 100 mg by mouth daily.    . isosorbide mononitrate (IMDUR) 60 MG 24 hr tablet Take 1 tablet (60 mg total) by mouth daily. 90 tablet 3  . letrozole (FEMARA) 2.5 MG tablet Take 1 tablet (2.5 mg total) by mouth daily. 90 tablet 2  . mirtazapine (REMERON) 15 MG tablet Take 1 tablet (15 mg total) by mouth at bedtime. 30 tablet 5  . nitroGLYCERIN (NITROSTAT) 0.4 MG SL tablet Place 1 tablet (0.4 mg total) under the tongue every 5 (five) minutes as needed for chest pain. 30 tablet 0  . NON FORMULARY Place 3 L into the nose as needed (oxygen).     . palbociclib (IBRANCE) 125 MG capsule Take 1 capsule (125 mg total) by mouth See admin instructions.  Take 125mg  every morning for 2 weeks on,  1 week off . 14 capsule 2  . potassium chloride SA (K-DUR,KLOR-CON) 20 MEQ tablet Take 2 tablets (40 mEq total) by mouth 2 (two) times daily. 120 tablet 6  . torsemide (DEMADEX) 20 MG tablet Take 20-80 mg by mouth See admin instructions. Daughter states she takes 80mg  in the morning, then take 40mg  in the afternoon.    [name] VITAMIN D, CHOLECALCIFEROL, PO Take 1,000 mg by mouth daily.      No current facility-administered medications for this visit.    Functional Status:  In your present state of health, do you have any difficulty performing the following activities: 06/09/2015 05/31/2015  Hearing? N N  Vision? N N  Difficulty concentrating or making decisions? N N  Walking or climbing stairs? Y N  Dressing or bathing? Y Y  Doing errands, shopping? [name]  Preparing Food and eating ? Y Y  Using the Toilet? N -  In the past six months, have you accidently leaked urine? N -  Do you have problems with loss of bowel control? N -  Managing your Medications? N -  Managing your Finances? N -  Housekeeping or managing your Housekeeping? [name]    Fall/Depression Screening: [name] 2/9 Scores 06/09/2015 06/03/2015 06/03/2015 04/13/2015 04/13/2015 04/01/2015 08/24/2014  PHQ - 2  Score 0 0 0 0 0 0 0    Assessment: (unable to complete sections due to no Internet service while in [employer] home. 69 year old with history of COPD, diabetes, heart failure, breast cancer. RNCM following for coordination of care/disease management.   History of COPD: Member identified COPD management is her primary concern. Member states she went to her pulmonary appointment last week-no changes were made. RNCM reviewed EMMI article COPD. Reviewed correct use of rescue inhaler. RNCM encouraged member to use rescue inhaler during home visit due to low pitch wheeze noted when listening to lungs and member stated she felt she was more short of breath with activity. RNCM reviewed proper use of inhaler  encouraging member to take only prescribed amount of puffs and to wait at least a minute between puffs. Less wheezing noted after rescue inhaler used and member reports she felt she was breathing better. Provided and discussed EMMI article "COPD". Member took the COPD assessment test. COPD action plan reinforced.  Discussed EMMI articles "myths about the flu vaccine and "the flu". Member states she is allergic to eggs and refuses to take the shot. Articles provided to member.  Fall risk: member reported history of one fall while accessing transportation services. Discussed fall prevention and provided and discussed EMMI article "Preventing Falls".   RNCM discussed COPD, reviewed use of rescue inhaler, member completed the COPD assessment sheet-score 42, reviewed the COPD action plan.  History of diabetes: Received new diabetic meter today. Accu check Aviva Plus. RNCM taught member how to use meter-teach back method used. Member return demonstrated use of meter and how to use her lancing device. RNCM encouraged member to check her blood sugars daily as ordered and record. Member reports her assistant will be checking her blood sugars.  Plan: Home visit next month.  Thea Silversmith, RN, MSN, [place] Coordinator Cell: [phone]

## 2015-06-24 DIAGNOSIS — L97821 Non-pressure chronic ulcer of other part of left lower leg limited to breakdown of skin: Secondary | ICD-10-CM | POA: Diagnosis not present

## 2015-06-24 DIAGNOSIS — R600 Localized edema: Secondary | ICD-10-CM | POA: Diagnosis not present

## 2015-06-24 DIAGNOSIS — T8133XD Disruption of traumatic injury wound repair, subsequent encounter: Secondary | ICD-10-CM | POA: Diagnosis not present

## 2015-06-24 DIAGNOSIS — I872 Venous insufficiency (chronic) (peripheral): Secondary | ICD-10-CM | POA: Diagnosis not present

## 2015-06-24 LAB — GLUCOSE, CAPILLARY: GLUCOSE-CAPILLARY: 149 mg/dL — AB (ref 65–99)

## 2015-07-01 ENCOUNTER — Encounter (HOSPITAL_BASED_OUTPATIENT_CLINIC_OR_DEPARTMENT_OTHER): Payer: Commercial Managed Care - HMO | Attending: Internal Medicine

## 2015-07-01 DIAGNOSIS — S71111A Laceration without foreign body, right thigh, initial encounter: Secondary | ICD-10-CM | POA: Insufficient documentation

## 2015-07-01 DIAGNOSIS — I83222 Varicose veins of left lower extremity with both ulcer of calf and inflammation: Secondary | ICD-10-CM | POA: Insufficient documentation

## 2015-07-01 DIAGNOSIS — I83212 Varicose veins of right lower extremity with both ulcer of calf and inflammation: Secondary | ICD-10-CM | POA: Insufficient documentation

## 2015-07-01 DIAGNOSIS — X58XXXA Exposure to other specified factors, initial encounter: Secondary | ICD-10-CM | POA: Diagnosis not present

## 2015-07-01 LAB — GLUCOSE, CAPILLARY: Glucose-Capillary: 145 mg/dL — ABNORMAL HIGH (ref 65–99)

## 2015-07-04 ENCOUNTER — Ambulatory Visit (HOSPITAL_COMMUNITY)
Admission: RE | Admit: 2015-07-04 | Discharge: 2015-07-04 | Disposition: A | Discharge status: Home / Self Care | Payer: Commercial Managed Care - HMO | Source: Ambulatory Visit | Attending: Hematology | Admitting: Hematology

## 2015-07-04 DIAGNOSIS — R918 Other nonspecific abnormal finding of lung field: Secondary | ICD-10-CM | POA: Diagnosis not present

## 2015-07-04 DIAGNOSIS — C7951 Secondary malignant neoplasm of bone: Secondary | ICD-10-CM | POA: Insufficient documentation

## 2015-07-04 DIAGNOSIS — C50919 Malignant neoplasm of unspecified site of unspecified female breast: Secondary | ICD-10-CM | POA: Diagnosis present

## 2015-07-04 LAB — GLUCOSE, CAPILLARY: GLUCOSE-CAPILLARY: 115 mg/dL — AB (ref 65–99)

## 2015-07-04 MED ORDER — FLUDEOXYGLUCOSE F - 18 (FDG) INJECTION
9.0000 | Freq: Once | INTRAVENOUS | Status: DC | PRN
  Administered 2015-07-04: 9 via INTRAVENOUS
  Filled 2015-07-04: qty 9

## 2015-07-05 ENCOUNTER — Ambulatory Visit: Payer: Medicare HMO | Admitting: Hematology

## 2015-07-05 ENCOUNTER — Ambulatory Visit: Payer: Medicare HMO

## 2015-07-05 ENCOUNTER — Other Ambulatory Visit: Payer: Medicare HMO

## 2015-07-06 ENCOUNTER — Telehealth: Payer: Self-pay | Admitting: Oncology

## 2015-07-06 ENCOUNTER — Telehealth: Payer: Self-pay | Admitting: Hematology

## 2015-07-06 ENCOUNTER — Ambulatory Visit (HOSPITAL_BASED_OUTPATIENT_CLINIC_OR_DEPARTMENT_OTHER): Payer: Commercial Managed Care - HMO

## 2015-07-06 ENCOUNTER — Ambulatory Visit (HOSPITAL_BASED_OUTPATIENT_CLINIC_OR_DEPARTMENT_OTHER): Payer: Commercial Managed Care - HMO | Admitting: Hematology

## 2015-07-06 ENCOUNTER — Other Ambulatory Visit (HOSPITAL_BASED_OUTPATIENT_CLINIC_OR_DEPARTMENT_OTHER): Payer: Commercial Managed Care - HMO

## 2015-07-06 ENCOUNTER — Encounter: Payer: Self-pay | Admitting: Hematology

## 2015-07-06 VITALS — BP 120/96 | HR 115 | Temp 97.9°F | Resp 18 | Ht 64.0 in | Wt 226.8 lb

## 2015-07-06 DIAGNOSIS — C7951 Secondary malignant neoplasm of bone: Secondary | ICD-10-CM

## 2015-07-06 DIAGNOSIS — C50911 Malignant neoplasm of unspecified site of right female breast: Secondary | ICD-10-CM

## 2015-07-06 DIAGNOSIS — N189 Chronic kidney disease, unspecified: Secondary | ICD-10-CM | POA: Diagnosis not present

## 2015-07-06 DIAGNOSIS — D638 Anemia in other chronic diseases classified elsewhere: Secondary | ICD-10-CM

## 2015-07-06 DIAGNOSIS — C50919 Malignant neoplasm of unspecified site of unspecified female breast: Secondary | ICD-10-CM

## 2015-07-06 LAB — COMPREHENSIVE METABOLIC PANEL (CC13)
ALT: 22 U/L (ref 0–55)
ANION GAP: 13 meq/L — AB (ref 3–11)
AST: 28 U/L (ref 5–34)
Albumin: 3.1 g/dL — ABNORMAL LOW (ref 3.5–5.0)
Alkaline Phosphatase: 113 U/L (ref 40–150)
BILIRUBIN TOTAL: 0.37 mg/dL (ref 0.20–1.20)
BUN: 31.1 mg/dL — ABNORMAL HIGH (ref 7.0–26.0)
CHLORIDE: 104 meq/L (ref 98–109)
CO2: 24 meq/L (ref 22–29)
CREATININE: 1.8 mg/dL — AB (ref 0.6–1.1)
Calcium: 9.4 mg/dL (ref 8.4–10.4)
EGFR: 28 mL/min/{1.73_m2} — ABNORMAL LOW (ref >90)
GLUCOSE: 199 mg/dL — AB (ref 70–140)
Potassium: 3.9 mEq/L (ref 3.5–5.1)
SODIUM: 141 meq/L (ref 136–145)
TOTAL PROTEIN: 6.5 g/dL (ref 6.4–8.3)

## 2015-07-06 LAB — CBC & DIFF AND RETIC
BASO%: 0.3 % (ref 0.0–2.0)
Basophils Absolute: 0 10*3/uL (ref 0.0–0.1)
EOS%: 2.8 % (ref 0.0–7.0)
Eosinophils Absolute: 0.1 10*3/uL (ref 0.0–0.5)
HCT: 30.3 % — ABNORMAL LOW (ref 34.8–46.6)
HGB: 9.7 g/dL — ABNORMAL LOW (ref 11.6–15.9)
IMMATURE RETIC FRACT: 10.8 % — AB (ref 1.60–10.00)
LYMPH%: 12.7 % — ABNORMAL LOW (ref 14.0–49.7)
MCH: 29.2 pg (ref 25.1–34.0)
MCHC: 32 g/dL (ref 31.5–36.0)
MCV: 91.3 fL (ref 79.5–101.0)
MONO#: 0.2 10*3/uL (ref 0.1–0.9)
MONO%: 6.2 % (ref 0.0–14.0)
NEUT%: 78 % — ABNORMAL HIGH (ref 38.4–76.8)
NEUTROS ABS: 2.8 10*3/uL (ref 1.5–6.5)
PLATELETS: 260 10*3/uL (ref 145–400)
RBC: 3.32 10*6/uL — AB (ref 3.70–5.45)
RDW: 18.7 % — ABNORMAL HIGH (ref 11.2–14.5)
RETIC %: 2.46 % — AB (ref 0.70–2.10)
RETIC CT ABS: 81.67 10*3/uL (ref 33.70–90.70)
WBC: 3.5 10*3/uL — ABNORMAL LOW (ref 3.9–10.3)
lymph#: 0.5 10*3/uL — ABNORMAL LOW (ref 0.9–3.3)

## 2015-07-06 MED ORDER — PALBOCICLIB 100 MG PO CAPS: 100.0000 mg | ORAL_CAPSULE | Freq: Every day | ORAL | Status: DC

## 2015-07-06 MED ORDER — DENOSUMAB 120 MG/1.7ML ~~LOC~~ SOLN
120.0000 mg | Freq: Once | SUBCUTANEOUS | Status: AC
  Administered 2015-07-06: 120 mg via SUBCUTANEOUS
  Filled 2015-07-06: qty 1.7

## 2015-07-06 NOTE — Telephone Encounter (Signed)
PER 2ND 11/9 POF MOVED 11/16 INJ TO 11/21 - CXD 12/7 APPOINTMENTS AND ADDED INJ FOR 12/5. APPOINTMENTS FOR 12/19 REMAIN THE SAME. LEFT MESSAGE FOR PATIENT ON CELL AND MAILED NEW SCHEDULE. NOT ABLE TO REACH PATIENT ON HOME PHONE OR LEAVE MESSAGE.

## 2015-07-06 NOTE — Telephone Encounter (Signed)
Gave  Patient avs report and appointments for November and December. Per 11/9 pof inj appointments for 11/14, 12/5 and 12/19. Per patient she cannot come 11/14 - needs to be 11/16 - and 12/5 adjusted to 12/7 and patient already has lab/LT/inj for 12/7. Lab/YF/inj 12/19 per 11/9 pof.

## 2015-07-06 NOTE — Telephone Encounter (Signed)
[name] left a message saying that she had a PET scan on Monday, 11/7, and would like Dr. Sondra Come to review the results.  She requested a return call.

## 2015-07-06 NOTE — Progress Notes (Signed)
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 6 mm groundglass nodule in the right upper lobe of lung.   10/10/2012 Initial Biopsy Right breast core needle biopsy showed invasive lobular carcinoma, grade 2. Left iliac crest bone biopsy showed metastatic carcinoma, morphology similar to breast biopsy.   10/14/2012 Initial Diagnosis Breast cancer metastasized to bone ([place])   11/2012 -  Anti-estrogen oral therapy Letrozole $RemoveBeforeDEI'1mg'nWvzchfgUmqjEPGh$  daily, add palbociclib on 01/01/2014, dose modified to 2 weeks on and 1 week off with cycle 3   02/02/2014 - 02/24/2014 Radiation Therapy Palliative radiation to left and the right femur, 30 gray in 10 fractions, by Dr. Sondra Come    09/01/2014 Imaging CT chest abdomen and pelvis showed several small pulmonary nodules are not changed. Stable widespread bone metastasis.   10/10/2014 Receptors her2 ER 100% positive, PR negative, HER-2 negative.   02/08/2015 Imaging PET scan showed a stable 8 mm right level III lymph node with SUV 8.8, no new neck adenopathy, stable diffuse osseous metastasis, stable for many nodules.   07/06/2015 Imaging PET scan showed overall interval disease progression, especially bone lesions, lung nodules appear unchanged in size, but RUL nodule demonstrated increased FDG uptake. stable level 3 R cervical nodes     PREVIOUS AND CURRENT THERAPY:   1. She started letrozole in 11/2012.  Added Palbociclib 125 mg daily for 21 days on and 7 days off on 01/01/2014 with disease progression. Dose modified to 2 weeks on 1 week off with cycle 3.   2.  zometa added on 01/01/2014 for metastatic bone disease. Zometa held due to renal insufficiency and changed to [facility] because of skeletal disease progression and safe renal profile. 3. Started palliative XRT to the left and right femur (30 Gy in 10 fractions) on 02/02/2014 per Dr. Gery Pray which was finished on 02/24/2014.  INTERVAL HISTORY:  Anita [name] 69 y.o. female with a history of right breast cancer with metastases to the bone/lungs here for follow-up.   She noticed new pain at right side chest wall below her breast, it's mild and persistent, she does not need pain medication. She has no other new pain or other symptoms. She is otherwise clinically stable. She goes to school Monday through Friday, still uses oxygen when she goes out. Her appetite and energy level is moderate and stable.  MEDICAL HISTORY: Past Medical History  Diagnosis Date  . Fibroid   . Morbid obesity ([place])   . 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 COPD (chronic obstructive pulmonary disease) ([place])   . Chronic diastolic CHF (congestive heart failure) ([place])   . NSTEMI (non-ST elevated myocardial infarction) [facility])     Cath November 2014 LAD 40% stenosis, first diagonal 80% stenosis, circumflex 20% stenosis, right coronary artery occluded. The EF was 35-40% at that time.  . Anemia     a. Adm 09/2012 with melena, Hgb 5.8 -> transfused. EGD/colonoscopy unrevealing.  . Breast cancer ([place])     a. Mets to bone. ER 100%/ PR 0%/Her2 neu negative.  [name]  Ulcers of both lower legs ([place])   . Tobacco abuse   . Hyperlipidemia 02/11/2013  . Chronic respiratory failure ([place])     a. On O2 qhs. also portable O2.  . Chronic renal insufficiency   . Lesion of vocal cord     a. CT 05/2013 concerning for tumor.  . Shortness of breath   . Depression   . Diabetes mellitus without complication ([place])   . Anginal pain ([place])   . Dementia     very mild  . Radiation 02/02/14-02/24/14    left and right  femur 30 gray    ALLERGIES:  is allergic to omnipaque and benzene.  MEDICATIONS:    Medication List       This list is accurate as of: 07/06/15  1:28 PM.  Always use your most recent med list.               albuterol 108 (90 BASE) MCG/ACT inhaler  Commonly known as:  PROVENTIL HFA;VENTOLIN HFA  Inhale 2 puffs into the lungs every 6 (six) hours as needed for wheezing or shortness of breath.     aspirin 81 MG EC tablet  Take 1 tablet (81 mg total) by mouth daily.     atorvastatin 40 MG tablet  Commonly known as:  LIPITOR  Take 40 mg by mouth daily.     bag balm Oint ointment  Apply 1 g topically as needed for dry skin or irritation.     clotrimazole 1 % cream  Commonly known as:  LOTRIMIN  Apply 1 application topically daily.     docusate sodium 100 MG capsule  Commonly known as:  COLACE  Take 100 mg by mouth daily.     feeding supplement (PRO-STAT SUGAR FREE 64) Liqd  Take 30 mLs by mouth 3 (three) times daily with meals.     isosorbide mononitrate 60 MG 24 hr tablet  Commonly known as:  IMDUR  Take 1 tablet (60 mg total) by mouth daily.     letrozole 2.5 MG tablet  Commonly known as:  FEMARA  Take 1 tablet (2.5 mg total) by mouth daily.     mirtazapine 15 MG tablet  Commonly known as:  REMERON  Take 1 tablet (15 mg total) by mouth at bedtime.     nitroGLYCERIN 0.4 MG SL tablet  Commonly known as:  NITROSTAT  Place 1 tablet (0.4 mg total) under the tongue every 5 (five) minutes as needed for chest pain.     NON FORMULARY  Place 3 L into the nose as needed (oxygen).     palbociclib 125 MG capsule  Commonly known as:  IBRANCE  Take 1 capsule (125 mg total) by mouth See admin instructions. Take 125mg  every morning for 2 weeks on,  1 week off .     potassium chloride SA 20 MEQ tablet  Commonly known as:  K-DUR,KLOR-CON  Take 2 tablets (40 mEq total) by mouth 2 (two) times daily.     torsemide 20 MG tablet  Commonly known as:  DEMADEX  Take 20-80 mg by  mouth See admin instructions. Daughter states she takes 80mg  in the morning, then take 40mg  in the afternoon.     VITAMIN C PO  Take 500 mg by mouth daily.     VITAMIN D (CHOLECALCIFEROL) PO  Take 1,000 mg by mouth daily.        SURGICAL HISTORY:  Past Surgical History  Procedure Laterality Date  . Colonoscopy, esophagogastroduodenoscopy (egd) and  esophageal dilation N/A 10/13/2012    Procedure: colonoscopy and egd;  Surgeon: Wonda Horner, MD;  Location: [facility] ENDOSCOPY;  Service: Endoscopy;  Laterality: N/A;  . Left heart catheterization with coronary angiogram N/A 07/07/2013    Procedure: LEFT HEART CATHETERIZATION WITH CORONARY ANGIOGRAM;  Surgeon: Peter M Martinique, MD;  Location: [facility] CATH LAB;  Service: Cardiovascular;  Laterality: N/A;   REVIEW OF SYSTEMS:   Constitutional: Denies fevers, chills or abnormal weight loss Eyes: Denies blurriness of vision Ears, nose, mouth, throat, and face: Denies mucositis or sore throat Respiratory: Denies cough, (+) stable dyspnea, no wheezes Cardiovascular: Denies palpitation, chest discomfort or lower extremity swelling Gastrointestinal:  Denies nausea, heartburn or change in bowel habits Skin: Denies abnormal skin rashes Lymphatics: Denies new lymphadenopathy or easy bruising Neurological:Denies numbness, tingling or new weaknesses Behavioral/Psych: Mood is stable, no new changes  All other systems were reviewed with the patient and are negative.  PHYSICAL EXAMINATION: ECOG PERFORMANCE STATUS: 2-3  Blood pressure 120/96, pulse 115, temperature 97.9 F (36.6 C), temperature source Oral, resp. rate 18, height $RemoveBe'5\' 4"'FLIiSrEVZ$  (1.626 m), weight 226 lb 12.8 oz (102.876 kg), SpO2 95 %.  GENERAL:alert, no distress and comfortable morbidly obese woman in wheelchair, chronically ill appearing SKIN: skin color, texture, turgor are normal; posterior legs with irritation and erythema with a small abrasion on the posterior left thigh with mild TTP.  EYES: normal,  Conjunctiva are pink and non-injected, sclera clear OROPHARYNX:no exudate, no erythema and lips, buccal mucosa, and tongue normal  NECK: supple, thyroid normal size, non-tender, without nodularity LYMPH:  no palpable lymphadenopathy in the cervical, axillary LUNGS: coarse breathing with slightly increased breathing rate HEART: regular rate & rhythm and no murmurs and 2+ lower extremity edema BREAST: deferred ABDOMEN:abdomen soft, non-tender and normal bowel sounds; obese Musculoskeletal:no cyanosis of digits and no clubbing; bilateral lower extremities are wrapped with gauze and a bandage up to mid calf. I can not see the wound in the right anterior tibia.  NEURO: alert & oriented x 3 with fluent speech, no focal motor/sensory deficits  LABORATORY DATA: CBC Latest Ref Rng 07/06/2015 06/07/2015 05/15/2015  WBC 3.9 - 10.3 10e3/uL 3.5(L) 2.7(L) 3.0(L)  Hemoglobin 11.6 - 15.9 g/dL 9.7(L) 8.8(L) 7.8(L)  Hematocrit 34.8 - 46.6 % 30.3(L) 27.2(L) 24.1(L)  Platelets 145 - 400 10e3/uL 260 160 182    CMP Latest Ref Rng 07/06/2015 06/07/2015 05/15/2015  Glucose 70 - 140 mg/dl 199(H) 215(H) 144(H)  BUN 7.0 - 26.0 mg/dL 31.1(H) 18.1 31(H)  Creatinine 0.6 - 1.1 mg/dL 1.8(H) 1.5(H) 1.93(H)  Sodium 136 - 145 mEq/L 141 142 139  Potassium 3.5 - 5.1 mEq/L 3.9 3.2(L) 3.8  Chloride 101 - 111 mmol/L - - 110  CO2 22 - 29 mEq/L 24 28 20(L)  Calcium 8.4 - 10.4 mg/dL 9.4 8.5 7.8(L)  Total Protein 6.4 - 8.3 g/dL 6.5 6.6 -  Total Bilirubin 0.20 - 1.20 mg/dL 0.37 0.60 -  Alkaline Phos 40 - 150 U/L 113 96 -  AST 5 - 34 U/L 28 40(H) -  ALT 0 - 55 U/L 22 38 -   PATHOLOGY REPORT  Diagnosis 10/10/2014 1. Bone, biopsy, left iliac crest - METASTATIC MAMMARY CARCINOMA, SEE COMMENT. 2. Breast, right, needle core biopsy - INVASIVE MAMMARY CARCINOMA, SEE COMMENT. Microscopic Comment 1. and 2. The breast biopsy demonstrates definitive morphologic features of invasive mammary carcinoma. Although the grade of tumor is best  assessed at resection, with these biopsies, the carcinoma is grade II. An E-Cadherin stain is  pending and will be reported as an addendum. The morphology of the metastatic carcinoma in part 2 is identical to the breast biopsy. Breast prognostic studies are pending (part 2) and will be reported as an addendum. The case was reviewed with Dr. Avis Epley who concurs. The case was discussed with Dr. Lamonte Sakai on 10/13/2012.  RADIOGRAPHIC STUDIES:  CT chest abdomen and pelvis 09/01/2014 IMPRESSION: Chest Impression:  1. Stable exam of thorax. 2. Several small smudgy pulmonary nodules are not changed. 3. Stable widespread bony metastasis pneumothorax.  Abdomen / Pelvis Impression:  1. Stable small periaortic retroperitoneal lymph node. 2. No evidence disease progression. 3. Stable widespread skeletal metastasis. 4. Left sided staghorn calculus again demonstrated. 5. Nodular focus in the right kidney at site of prior renal cyst is not changed  PET 07/04/2015 IMPRESSION: 1. Overall there has been an interval progression of disease compared with previous exam. 2. Most notably is the interval increase in FDG uptake associated with multi focal hypermetabolic bone lesions. 3. Similar appearance scratch set similar size and degree of FDG uptake associated with hypermetabolic right level 3 lymph node. There is a pulmonary nodule in the right upper lobe which demonstrates increase in FDG uptake compared with previous exam. Anatomically be pulmonary nodules all appear unchanged in size.   ASSESSMENT: 69 year old Caucasian female  PLAN:  1.  Metastatic breast cancer (ER pos/PR neg/ HER2 neg) with metastasis to bones and lungs.  -I reviewed her restaging PET scan from 07/04/2015 with pt in [place], which showed slight disease progression in the bones , lung metastasis are stable in size, but the largest lesion has increased FDG uptake.  She also noticed some new right-sided chest wall pain, possibly  related to bone metastasis.  -Given the mild disease progression, I suggest we switch her antiestrogen therapy -I recommend her to change letrozole to fulvestrant injection, and continue Ibrance, but I'll change her Ibrance from 125 mg 2 weeks on, one week off, to 100 mg, 3 weeks on, 1 week off, she previously had neutropenia from 125 mg 3 weeks on regimen. -Potential side effect of fulvestrant, which includes but not limited to, injection pain, fatigue, nausea, dizziness, hot flash, dryness, mood sewing, etc., were discussed with patient, she agrees to proceed. -I will repeat her PET in 3 month   2. bone metastasis - continue Xgeva monthly  -Continue calcium and vitamin D  3. 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   --She has been seen by radiation oncology and followed by Dr. Sondra Come.  -She never had any biopsy, she follows up with Dr. Sondra Come, who does not think she need radiation.   5. Anemia of chronic disease  -- Past work up for reversible causes of anemia were negative.  -Stable moderate anemia, her hemoglobin is 9.7 today. We'll hold on transfusion, continue monitoring. -Repeated ferritin 255, serum iron 39 and saturation 14% in 11/2014 -She is not taking iron pill, could not tolerate.   6. CHF --She is not fluid overloaded today on exam. She is on Lasix, nitrogylerin prn, losartan. Increase lasix prn and weight daily. Cards follow up.   7. Diabetes mellitus, type II. Diet controlled.   8. COPD: On albuterol prn. She remains on home oxygen.  9. Chronic renal insufficiency.  -Stable.  10: Bilateral leg wound -Worsening right lower extremity wound after her fall in August -She still follows up with his wound clinic once a week with dressing change -her right LE wound is healing well    All questions were answered. The  patient knows to call the clinic with any problems, questions or concerns. We can certainly see the patient much sooner if necessary.  Plan -Xgeva injection today  and continue monthly -Start fulvestrant 500mg  injection on November 21, every 2 weeks for the first months then monthly -Start you brance I100 mg daily, 3 weeks on, one-week off, on 11/21 /2016, I will send new prescription to her pharmacy -I'll see her back on December 19 before her second cycle fulvestrant and Ibrance  And xgeva   I spent 25 minutes counseling the patient face to face. The total time spent in the appointment was 30 minutes.   Anita [name]  07/06/2015

## 2015-07-07 ENCOUNTER — Other Ambulatory Visit: Payer: Self-pay | Admitting: Hematology

## 2015-07-07 DIAGNOSIS — C50919 Malignant neoplasm of unspecified site of unspecified female breast: Secondary | ICD-10-CM

## 2015-07-07 DIAGNOSIS — C7951 Secondary malignant neoplasm of bone: Principal | ICD-10-CM

## 2015-07-07 LAB — CANCER ANTIGEN 27.29: CA 27.29: 73 U/mL — ABNORMAL HIGH (ref 0–39)

## 2015-07-07 MED ORDER — PALBOCICLIB 100 MG PO CAPS: 100.0000 mg | ORAL_CAPSULE | Freq: Every day | ORAL | Status: DC

## 2015-07-07 NOTE — Addendum Note (Signed)
Addended by: Truitt Merle on: 07/07/2015 05:40 PM   Modules accepted: Orders

## 2015-07-08 ENCOUNTER — Telehealth: Payer: Self-pay | Admitting: *Deleted

## 2015-07-08 DIAGNOSIS — I83222 Varicose veins of left lower extremity with both ulcer of calf and inflammation: Secondary | ICD-10-CM | POA: Diagnosis not present

## 2015-07-08 DIAGNOSIS — I83212 Varicose veins of right lower extremity with both ulcer of calf and inflammation: Secondary | ICD-10-CM | POA: Diagnosis not present

## 2015-07-08 DIAGNOSIS — S71111A Laceration without foreign body, right thigh, initial encounter: Secondary | ICD-10-CM | POA: Diagnosis not present

## 2015-07-08 NOTE — Telephone Encounter (Signed)
Faxed new script for [facility] to [place] @ [phone].

## 2015-07-11 ENCOUNTER — Other Ambulatory Visit: Payer: Self-pay

## 2015-07-11 NOTE — Patient Outreach (Signed)
[facility]) Care Management  [place]  07/11/2015   ANNTONETTE KUCHERA 1946/08/26 [id number]  Subjective: "I am in the Green [place]", I've been released from the wound care center, It is all healed, it is all healed. They just put a bandage on it". Member reports she was last seen at the wound care center on Friday and instructed to leave the bandage on for a week.  Objective: BP 108/60 mmHg  Pulse 95  Resp 20  Ht 1.575 m (5\' 2" )  Wt 226 lb (102.513 kg)  BMI 41.33 kg/m2  SpO2 96% , lungs with faint wheeze noted on expiration,respirations even/unlabored, heart rate regular  Current Medications:  Current Outpatient Prescriptions  Medication Sig Dispense Refill  . albuterol (PROVENTIL HFA;VENTOLIN HFA) 108 (90 BASE) MCG/ACT inhaler Inhale 2 puffs into the lungs every 6 (six) hours as needed for wheezing or shortness of breath. 3 Inhaler 3  . Ascorbic Acid (VITAMIN C PO) Take 500 mg by mouth daily.     [name] aspirin EC 81 MG EC tablet Take 1 tablet (81 mg total) by mouth daily. 30 tablet 3  . atorvastatin (LIPITOR) 40 MG tablet Take 40 mg by mouth daily.    . bag balm OINT ointment Apply 1 g topically as needed for dry skin or irritation. 300 g 11  . clotrimazole (LOTRIMIN) 1 % cream Apply 1 application topically daily. 113 g 3  . docusate sodium (COLACE) 100 MG capsule Take 100 mg by mouth daily.    . isosorbide mononitrate (IMDUR) 60 MG 24 hr tablet Take 1 tablet (60 mg total) by mouth daily. 90 tablet 3  . letrozole (FEMARA) 2.5 MG tablet Take 1 tablet (2.5 mg total) by mouth daily. 90 tablet 2  . mirtazapine (REMERON) 15 MG tablet Take 1 tablet (15 mg total) by mouth at bedtime. 30 tablet 5  . nitroGLYCERIN (NITROSTAT) 0.4 MG SL tablet Place 1 tablet (0.4 mg total) under the tongue every 5 (five) minutes as needed for chest pain. 30 tablet 0  . NON FORMULARY Place 3 L into the nose as needed (oxygen).     . palbociclib (IBRANCE) 100 MG capsule Take 1 capsule (100 mg  total) by mouth daily with breakfast. Take whole with food. 21 capsule 3  . potassium chloride SA (K-DUR,KLOR-CON) 20 MEQ tablet Take 2 tablets (40 mEq total) by mouth 2 (two) times daily. 120 tablet 6  . torsemide (DEMADEX) 20 MG tablet Take 20-80 mg by mouth See admin instructions. Daughter states she takes 80mg  in the morning, then take 40mg  in the afternoon.    [name] VITAMIN D, CHOLECALCIFEROL, PO Take 1,000 mg by mouth daily.     . Amino Acids-Protein Hydrolys (FEEDING SUPPLEMENT, PRO-STAT SUGAR FREE 64,) LIQD Take 30 mLs by mouth 3 (three) times daily with meals.     No current facility-administered medications for this visit.     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 Member reports she is doing good. Shortness of breath noted with activity.   History of wound cellulitis- member reports wound healed. Right leg wrapped with bandage noted front of leg. Ted hose in place. RNCM reinforced that member should remove compression stockings daily. Also reinforced to elevate legs bilaterally when sitting. Legs remain edematous. Noted to right leg(just above wrap) skin tear. Member states, "that is me" when asked to clarify, member stated she had been  scratching that area.  History of COPD-member acknowledges she has used her rescue inhaler, but has not used it often. Member showed RNCM her new supply of albuterol inhalers. RNCM reviewed COPD action plan. Discussed transitioning to the health coach for further education/reinforcement of COPD. Member is agreeable.  History of diabetes-member's blood sugar has been checked 4 times since last visit. Member refuses to check her blood sugar herself, instead has her personal care assistant to check it. Member is using her [facility] calender/organizer to record blood sugar reading. Member return demonstrated use of glucose meter, but had  some difficulty with sticking her finger. Member states, the most she will allow her blood sugar to be checked is 2-3 times/week. Member is not on insulin or oral hyperglycemics.  Discussed visit with oncologist-member states that she has cancer of the breast with metastasis to the bone and lungs. Member states she will be starting a new treatment, but has questions about the new medication she will be given. RNCM encouraged member to ask questions to provider as needed. RNCM will refer to [facility] pharmacist for medication education.   Member states her main priority at this time is COPD management.  Plan: Contact [employer] for follow up to see if member is eligible for their dual program. Contact [facility] to see if member is eligible. Transition to health coach pending.  Thea Silversmith, RN, MSN, [place] Coordinator Cell: [phone]

## 2015-07-12 ENCOUNTER — Other Ambulatory Visit: Payer: Self-pay

## 2015-07-12 NOTE — Patient Outreach (Signed)
[facility]) Care Management  07/12/2015  Anita [name] Jul 23, 1946 [id number]  Care Coordination: call to [facility] to see if member is  Eligible for there program. No answer. HIPPA compliant message left.  Plan: Await return call  Thea Silversmith, RN, MSN, [place] Coordinator Cell: [phone]

## 2015-07-12 NOTE — Patient Outreach (Signed)
[facility] [facility]) Care Management  07/12/2015  Anita [name] 1945-12-19 [id number]  Care Coordination: call to [facility] to see if member is eligible for SNP plan - member is being followed by [facility] Management Sophoria Lewis. RNCM called to discuss plan. No answer. HIPPA compliant message left.  Plan: await return call.  Thea Silversmith, RN, MSN, [place] Coordinator Cell: [phone]

## 2015-07-13 ENCOUNTER — Other Ambulatory Visit: Payer: Self-pay

## 2015-07-13 ENCOUNTER — Ambulatory Visit: Payer: Commercial Managed Care - HMO

## 2015-07-13 DIAGNOSIS — Z719 Counseling, unspecified: Secondary | ICD-10-CM

## 2015-07-13 NOTE — Patient Outreach (Signed)
[facility]) Care Management  07/13/2015  Anita [name] Aug 31, 1945 [id number]  Assessment: RNCM returned call to [place], Anita [name] who reports that she has been following member telephonically approximately every two weeks. RNCM discussed member's concern regarding new medication that she is going to start, Faslodex. RNCM discussed member would like to continue to work on education/reinforcement of COPD education/action plan. Also discussed per member report that member has been discharged from the wound care center-wound healed. RNCM was not able to view leg-Per member bandage to stay in place for a week. [name] is already aware of member's discharge from the wound care center and that member is to keep bandage on as healed skin is fragile to protect the healed area. Also discussed member's plan to schedule an appointment for the Mobile screening Unit regarding wellness checks.   Plan: [name] to continue to follow member for case management; reinforcement/education regarding COPD and any other care management needs. Member may not need Health Coach-RNCM will call member to discuss. RNCM will consult [place] for education regarding Faslodex medication.  Anita Silversmith, RN, MSN, [place] Coordinator Cell: [phone]

## 2015-07-13 NOTE — Patient Outreach (Signed)
[facility]) Care Management  07/13/2015  JULLIANA BENISH [birth year] [id number]  Assessment: RNCM called to speak with member regarding [employer] Case manager's planned continued follow up. No answer. Unable to leave message.  Plan: telephone call tomorrow   Thea Silversmith, RN, MSN, [place] Coordinator Cell: [phone]

## 2015-07-13 NOTE — Patient Outreach (Signed)
[facility]) Care Management  07/13/2015  Anita [name] 08/09/1946 [id number]  Assessment: received call from Heath Lark with [facility] regarding eligibility. Member is not eligible for partnership for community care services.  Plan: Follow up with [name] Case Manager.  Thea Silversmith, RN, MSN, [place] Coordinator Cell: [phone]

## 2015-07-14 ENCOUNTER — Other Ambulatory Visit: Payer: Self-pay

## 2015-07-14 NOTE — Patient Outreach (Signed)
[place] [facility]) Care Management  07/14/2015  IOLA RITTER Mar 11, 1946 [id number]  Care Coordination: RNCM called to speak with member regarding RNCM's last conversation with [facility]. No answer. HIPPA compliant message left.  Plan: Follow up call next week.  Thea Silversmith, RN, MSN, [place] Coordinator Cell: [phone]

## 2015-07-15 ENCOUNTER — Telehealth: Payer: Self-pay | Admitting: Oncology

## 2015-07-15 ENCOUNTER — Telehealth: Payer: Self-pay | Admitting: *Deleted

## 2015-07-15 NOTE — Telephone Encounter (Signed)
Anita [name] called and said she had a PET scan last week and would like Dr. Sondra Come to review the results to see if she needs radiation.  She said "it is growing and I need to nip this in the bud." Left Anita [name] a message stating that Dr. Sondra Come is out of the office on Friday and Monday and we will get back to her next week.

## 2015-07-15 NOTE — Telephone Encounter (Signed)
Received fax from [place] stating pt received her Ibrance shipment on 07/13/15.

## 2015-07-18 ENCOUNTER — Ambulatory Visit (HOSPITAL_BASED_OUTPATIENT_CLINIC_OR_DEPARTMENT_OTHER): Payer: Commercial Managed Care - HMO

## 2015-07-18 VITALS — BP 143/61 | HR 120 | Temp 98.1°F

## 2015-07-18 DIAGNOSIS — C50911 Malignant neoplasm of unspecified site of right female breast: Secondary | ICD-10-CM

## 2015-07-18 DIAGNOSIS — Z5111 Encounter for antineoplastic chemotherapy: Secondary | ICD-10-CM | POA: Diagnosis not present

## 2015-07-18 DIAGNOSIS — C7951 Secondary malignant neoplasm of bone: Secondary | ICD-10-CM

## 2015-07-18 MED ORDER — FULVESTRANT 250 MG/5ML IM SOLN
500.0000 mg | INTRAMUSCULAR | Status: DC
  Administered 2015-07-18: 500 mg via INTRAMUSCULAR
  Filled 2015-07-18: qty 10

## 2015-07-18 NOTE — Patient Instructions (Signed)

## 2015-07-19 ENCOUNTER — Other Ambulatory Visit: Payer: Self-pay

## 2015-07-19 NOTE — Patient Outreach (Addendum)
[facility]) Care Management  07/19/2015  Anita [name] [birth year] [id number]  Care Coordination: Call to discuss transition to [facility] Case management versus Health Coach. No answer-unable to leave message.  Thea Silversmith, RN, MSN, [place] Coordinator Cell: [phone]

## 2015-07-22 ENCOUNTER — Other Ambulatory Visit: Payer: Self-pay | Admitting: Pharmacist

## 2015-07-22 NOTE — Patient Outreach (Signed)
Ralene Cork was referred to pharmacy as patient has questions about being started on a new medication, Faslodex. Left a HIPAA compliant message on the patient's voicemail. If have not heard from patient by 07/25/15, will give her another call at that time.  [name], PharmD Clinical Pharmacist [place] Management [phone]

## 2015-07-25 ENCOUNTER — Other Ambulatory Visit: Payer: Self-pay | Admitting: Pharmacist

## 2015-07-25 ENCOUNTER — Ambulatory Visit: Payer: Commercial Managed Care - HMO

## 2015-07-25 NOTE — Patient Outreach (Signed)
Anita [name] was referred to pharmacy as patient has questions about being started on a new medication, Faslodex. Received a voicemail from the patient from Friday evening. No answer today. Again left a HIPAA compliant message on the patient's voicemail. If have not heard from patient by 07/27/15, will give her another call at that time.  [name], PharmD Clinical Pharmacist [place] Management [phone]

## 2015-07-27 ENCOUNTER — Other Ambulatory Visit: Payer: Self-pay | Admitting: Pharmacist

## 2015-07-27 NOTE — Patient Outreach (Signed)
Anita [name] was referred to pharmacy as patient has questions about being started on a new medication, Faslodex. Return call attempt #2. Again left a HIPAA compliant message on the patient's cell phone and home voicemails. If have not heard from patient by next week, will give her another call at that time.  [name], PharmD Clinical Pharmacist [place] Management [phone]

## 2015-07-30 ENCOUNTER — Other Ambulatory Visit: Payer: Self-pay | Admitting: Family Medicine

## 2015-07-30 ENCOUNTER — Other Ambulatory Visit: Payer: Self-pay | Admitting: Hematology

## 2015-08-01 ENCOUNTER — Other Ambulatory Visit (HOSPITAL_BASED_OUTPATIENT_CLINIC_OR_DEPARTMENT_OTHER): Payer: Commercial Managed Care - HMO

## 2015-08-01 ENCOUNTER — Telehealth: Payer: Self-pay | Admitting: Oncology

## 2015-08-01 ENCOUNTER — Ambulatory Visit (HOSPITAL_BASED_OUTPATIENT_CLINIC_OR_DEPARTMENT_OTHER): Payer: Commercial Managed Care - HMO

## 2015-08-01 ENCOUNTER — Other Ambulatory Visit: Payer: Self-pay

## 2015-08-01 VITALS — BP 124/54 | HR 101 | Temp 98.4°F | Resp 26

## 2015-08-01 DIAGNOSIS — Z5111 Encounter for antineoplastic chemotherapy: Secondary | ICD-10-CM

## 2015-08-01 DIAGNOSIS — D638 Anemia in other chronic diseases classified elsewhere: Secondary | ICD-10-CM

## 2015-08-01 DIAGNOSIS — C50919 Malignant neoplasm of unspecified site of unspecified female breast: Secondary | ICD-10-CM

## 2015-08-01 DIAGNOSIS — C50911 Malignant neoplasm of unspecified site of right female breast: Secondary | ICD-10-CM | POA: Diagnosis not present

## 2015-08-01 DIAGNOSIS — C7951 Secondary malignant neoplasm of bone: Secondary | ICD-10-CM

## 2015-08-01 LAB — COMPREHENSIVE METABOLIC PANEL
ALBUMIN: 3.1 g/dL — AB (ref 3.5–5.0)
ALK PHOS: 96 U/L (ref 40–150)
ALT: 16 U/L (ref 0–55)
AST: 19 U/L (ref 5–34)
Anion Gap: 10 mEq/L (ref 3–11)
BILIRUBIN TOTAL: 0.31 mg/dL (ref 0.20–1.20)
BUN: 35.2 mg/dL — AB (ref 7.0–26.0)
CO2: 24 mEq/L (ref 22–29)
CREATININE: 1.8 mg/dL — AB (ref 0.6–1.1)
Calcium: 8.9 mg/dL (ref 8.4–10.4)
Chloride: 108 mEq/L (ref 98–109)
EGFR: 28 mL/min/{1.73_m2} — AB (ref >90)
GLUCOSE: 141 mg/dL — AB (ref 70–140)
Potassium: 3.4 mEq/L — ABNORMAL LOW (ref 3.5–5.1)
SODIUM: 142 meq/L (ref 136–145)
TOTAL PROTEIN: 6.3 g/dL — AB (ref 6.4–8.3)

## 2015-08-01 LAB — CBC & DIFF AND RETIC
BASO%: 0.6 % (ref 0.0–2.0)
Basophils Absolute: 0 10*3/uL (ref 0.0–0.1)
EOS%: 2.1 % (ref 0.0–7.0)
Eosinophils Absolute: 0.1 10*3/uL (ref 0.0–0.5)
HEMATOCRIT: 26.7 % — AB (ref 34.8–46.6)
HGB: 8.6 g/dL — ABNORMAL LOW (ref 11.6–15.9)
Immature Retic Fract: 12.6 % — ABNORMAL HIGH (ref 1.60–10.00)
LYMPH%: 10.4 % — AB (ref 14.0–49.7)
MCH: 29.1 pg (ref 25.1–34.0)
MCHC: 32.2 g/dL (ref 31.5–36.0)
MCV: 90.2 fL (ref 79.5–101.0)
MONO#: 0.1 10*3/uL (ref 0.1–0.9)
MONO%: 3.9 % (ref 0.0–14.0)
NEUT#: 2.8 10*3/uL (ref 1.5–6.5)
NEUT%: 83 % — AB (ref 38.4–76.8)
PLATELETS: 259 10*3/uL (ref 145–400)
RBC: 2.96 10*6/uL — AB (ref 3.70–5.45)
RDW: 18.2 % — ABNORMAL HIGH (ref 11.2–14.5)
Retic %: 1.99 % (ref 0.70–2.10)
Retic Ct Abs: 58.9 10*3/uL (ref 33.70–90.70)
WBC: 3.4 10*3/uL — ABNORMAL LOW (ref 3.9–10.3)
lymph#: 0.4 10*3/uL — ABNORMAL LOW (ref 0.9–3.3)

## 2015-08-01 MED ORDER — DENOSUMAB 120 MG/1.7ML ~~LOC~~ SOLN
120.0000 mg | Freq: Once | SUBCUTANEOUS | Status: AC
  Administered 2015-08-01: 120 mg via SUBCUTANEOUS
  Filled 2015-08-01: qty 1.7

## 2015-08-01 MED ORDER — FULVESTRANT 250 MG/5ML IM SOLN
500.0000 mg | INTRAMUSCULAR | Status: DC
  Administered 2015-08-01: 500 mg via INTRAMUSCULAR
  Filled 2015-08-01: qty 10

## 2015-08-01 NOTE — Telephone Encounter (Signed)
Dr. Lacinda Axon has changed offices, please advise refill.  Thanks Lavella Lemons, RN

## 2015-08-01 NOTE — Patient Outreach (Addendum)
[place] [facility]) Care Management  08/01/2015  KERILYNN KARGE 1945/11/02 [id number]  Assessment: Multiple attempts to reach member regarding transition to [facility] Management for care management/disease management. Unable to reach.  Plan: RNCM will send letter to member and close case out of case. RNCM will also update to pharmacist.  Thea Silversmith, RN, MSN, [place] Coordinator Cell: [phone]

## 2015-08-01 NOTE — Telephone Encounter (Signed)
Brianna called and said she was returning Dr. Clabe Seal call.  Advised her that he is out of the office today but will be notified that she called.

## 2015-08-02 ENCOUNTER — Ambulatory Visit: Payer: Medicare HMO

## 2015-08-02 ENCOUNTER — Ambulatory Visit: Payer: Medicare HMO | Admitting: Hematology

## 2015-08-02 ENCOUNTER — Other Ambulatory Visit: Payer: Medicare HMO

## 2015-08-02 NOTE — Telephone Encounter (Signed)
Received fax from [facility] requesting 90 day supply for Atorvastatin and Mirtazapine

## 2015-08-03 ENCOUNTER — Ambulatory Visit: Payer: Self-pay

## 2015-08-03 ENCOUNTER — Telehealth: Payer: Self-pay | Admitting: Internal Medicine

## 2015-08-03 ENCOUNTER — Ambulatory Visit: Payer: Commercial Managed Care - HMO

## 2015-08-03 ENCOUNTER — Ambulatory Visit: Payer: Self-pay | Admitting: Nurse Practitioner

## 2015-08-03 ENCOUNTER — Other Ambulatory Visit: Payer: Self-pay | Admitting: Pharmacist

## 2015-08-03 ENCOUNTER — Other Ambulatory Visit: Payer: Commercial Managed Care - HMO

## 2015-08-03 DIAGNOSIS — C7951 Secondary malignant neoplasm of bone: Principal | ICD-10-CM

## 2015-08-03 DIAGNOSIS — C50919 Malignant neoplasm of unspecified site of unspecified female breast: Secondary | ICD-10-CM

## 2015-08-03 MED ORDER — ATORVASTATIN CALCIUM 40 MG PO TABS: 40.0000 mg | ORAL_TABLET | Freq: Every day | ORAL | Status: DC

## 2015-08-03 MED ORDER — MIRTAZAPINE 15 MG PO TABS: 15.0000 mg | ORAL_TABLET | Freq: Every day | ORAL | Status: DC

## 2015-08-03 NOTE — Patient Outreach (Signed)
Anita [name] was referred to pharmacy as patient has questions about being started on a new medication, Faslodex. Received a call back from [name] today who reports that she has been receiving the Faslodex at the Cancer center, but would like to know more about side effects of the medication.   Review with [name] potential side effects of Faslodex including injection site reaction and discomfort, fatigue, headache, nausea, vomiting, constipation and hot flashes. Remind patient that the cancer center will continue to check her blood work to look at her blood cell counts and liver function. Remind patient with the regimen that she is on that it is important to avoid infection with hand washing, avoiding large crowds and avoiding being around people who are sick. Patient verbalizes understanding. Patient states that she has my phone number and will call me in the future if she has future questions.  [name], PharmD Clinical Pharmacist [place] Management [phone]

## 2015-08-03 NOTE — Telephone Encounter (Signed)
Contacted patient to assure her that it had been complete. Patient returned call and wanted me to provide the fax number of the [place] that one of the Lipitors and the remeron is to be sent to. [phone]. Thank you, Fonda Kinder, ASA

## 2015-08-03 NOTE — Telephone Encounter (Signed)
Pt called and needs a refill on mirtazapine (REMERON) 15 MG tablet and atorvastatin (LIPITOR) 40 MG tablet sent to her usual pharmacy, [facility] mail delivery. Thank you, Fonda Kinder, ASA

## 2015-08-03 NOTE — Telephone Encounter (Signed)
Pt called once more to request that the atorvastatin be sent to both, [employer] and [place] on [address]. Sadie Reynolds, ASA

## 2015-08-04 NOTE — Telephone Encounter (Signed)
Received second request from [facility] via fax for 90 day supply on bothl meds. Velora Heckler, RN

## 2015-08-05 ENCOUNTER — Ambulatory Visit (INDEPENDENT_AMBULATORY_CARE_PROVIDER_SITE_OTHER): Payer: Commercial Managed Care - HMO | Admitting: Internal Medicine

## 2015-08-05 ENCOUNTER — Encounter: Payer: Self-pay | Admitting: Internal Medicine

## 2015-08-05 VITALS — BP 130/61 | HR 89 | Temp 98.1°F

## 2015-08-05 DIAGNOSIS — E119 Type 2 diabetes mellitus without complications: Secondary | ICD-10-CM | POA: Diagnosis not present

## 2015-08-05 DIAGNOSIS — J410 Simple chronic bronchitis: Secondary | ICD-10-CM | POA: Diagnosis not present

## 2015-08-05 LAB — POCT GLYCOSYLATED HEMOGLOBIN (HGB A1C): HEMOGLOBIN A1C: 6.5

## 2015-08-05 NOTE — Progress Notes (Signed)
Subjective:    Anita [name] - 69 y.o. female MRN [id number]  Date of birth: [birth year]  HPI  Anita [name] is here for follow up of her chronic medical problems. 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 We briefly discussed change in medication regimen for her cancer treatment.  1) DM Type II  -diet controlled, last A1C 6.6 -CBGs are checked weekly at wound clinic (seen there for LE edema/ulceration 2/2 to venous stasis)  -patient very insistent on having A1Cs followed   2) COPD -on Oxygen 3L [place] today; was on 4L at last office visit in Oct  -at baseline today, denies increased SOB  -takes albuterol once every 2-3 weeks  -has tried Anoro and Spiriva without perceived benefit in the past    Attempted conversation today regarding end of life care goals. Patient says she her Healthcare POA is her daughter Loletta Parish. She has a living will. She wishes to be Full [place]. States she sees palliative care, but upon further questioning I'm not confident that that is who she is seeing. Says she isn't going anywhere and doesn't need help with long term goals for the end of her life.   Health Maintenance Due  Topic Date Due  . Hepatitis C Screening  10-Dec-1945  . FOOT EXAM  05/10/1957  . OPHTHALMOLOGY EXAM  05/10/1957  . URINE MICROALBUMIN  05/10/1957  . MAMMOGRAM  05/05/2015    -  reports that she quit smoking about 2 years ago. Her smoking use included Cigarettes. She has a 80 pack-year smoking history. She has never used smokeless tobacco. - Review of Systems: Per HPI. - Past Medical History: Patient Active Problem List   Diagnosis Date Noted  . Palliative care encounter   . Wound cellulitis   . Severe sepsis ([place]) 05/12/2015  . Sepsis ([place]) 05/12/2015  . AKI (acute kidney injury) ([place]) 05/12/2015  . Leg wound, right 05/12/2015  . Laceration   . Absolute anemia   . CAD in  native artery   . Venous stasis ulcer ([place])   . COLD (chronic obstructive lung disease) ([place])   . Leg laceration 04/04/2015  . Laceration of lower extremity 04/04/2015  . Venous stasis of lower extremity 12/05/2014  . Intertrigo 02/19/2014  . 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 Hyperlipidemia 02/11/2013  . Breast cancer metastasized to bone ([place]) 10/14/2012  . Chronic respiratory failure ([place]) 10/06/2012  . Chronic diastolic heart failure ([place]) 10/06/2012  . CAD (coronary artery disease) 10/06/2012  . Chronic kidney disease, stage III (moderate) 10/06/2012  . Anemia of chronic disease 10/06/2012  . Breast mass, right 10/06/2012  . History of tobacco abuse 10/06/2012  . COPD (chronic obstructive pulmonary disease) ([place]) 09/27/2012  . DM2 (diabetes mellitus, type 2) ([place]) 09/26/2012   - Medications: reviewed and updated Current Outpatient Prescriptions  Medication Sig Dispense Refill  . albuterol (PROVENTIL HFA;VENTOLIN HFA) 108 (90 BASE) MCG/ACT inhaler Inhale 2 puffs into the lungs every 6 (six) hours as needed for wheezing or shortness of breath. 3 Inhaler 3  . Amino Acids-Protein Hydrolys (FEEDING SUPPLEMENT, PRO-STAT SUGAR FREE 64,) LIQD Take 30 mLs by mouth 3 (three) times daily with meals.    . Ascorbic Acid (VITAMIN C PO) Take 500 mg by mouth daily.     [name] aspirin EC 81 MG EC tablet Take 1 tablet (81 mg total) by mouth daily. 30 tablet 3  .  atorvastatin (LIPITOR) 40 MG tablet Take 1 tablet (40 mg total) by mouth daily. 30 tablet 5  . atorvastatin (LIPITOR) 40 MG tablet Take 1 tablet (40 mg total) by mouth daily. 90 tablet 3  . bag balm OINT ointment Apply 1 g topically as needed for dry skin or irritation. 300 g 11  . clotrimazole (LOTRIMIN) 1 % cream Apply 1 application topically daily. 113 g 3  . docusate sodium (COLACE) 100 MG capsule Take 100 mg by mouth daily.    . isosorbide mononitrate (IMDUR) 60 MG 24 hr tablet Take 1 tablet (60 mg total) by mouth daily. 90 tablet 3  .  letrozole (FEMARA) 2.5 MG tablet Take 1 tablet (2.5 mg total) by mouth daily. 90 tablet 2  . mirtazapine (REMERON) 15 MG tablet TAKE ONE TABLET BY MOUTH AT BEDTIME 30 tablet 6  . mirtazapine (REMERON) 15 MG tablet Take 1 tablet (15 mg total) by mouth at bedtime. 30 tablet 5  . nitroGLYCERIN (NITROSTAT) 0.4 MG SL tablet Place 1 tablet (0.4 mg total) under the tongue every 5 (five) minutes as needed for chest pain. 30 tablet 0  . NON FORMULARY Place 3 L into the nose as needed (oxygen).     . palbociclib (IBRANCE) 100 MG capsule Take 1 capsule (100 mg total) by mouth daily with breakfast. Take whole with food. 21 capsule 3  . potassium chloride SA (K-DUR,KLOR-CON) 20 MEQ tablet Take 2 tablets (40 mEq total) by mouth 2 (two) times daily. 120 tablet 6  . torsemide (DEMADEX) 20 MG tablet Take 20-80 mg by mouth See admin instructions. Daughter states she takes 80mg  in the morning, then take 40mg  in the afternoon.    [name] VITAMIN D, CHOLECALCIFEROL, PO Take 1,000 mg by mouth daily.      No current facility-administered medications for this visit.     Review of Systems See HPI     Objective:   Physical Exam BP 130/61 mmHg  Pulse 89  Temp(Src) 98.1 F (36.7 C)  SpO2 96% Gen: overweight female in NAD  CV: RRR. No murmurs appreciated.  Resp: Wheezes in upper lung fields bilaterally. No crackles. Normal WOB on 3L O2 by [place].      Assessment & Plan:   COPD (chronic obstructive pulmonary disease) With chronic respiratory failure. O2 saturation stable today on 3L O2 via [place]. Patient to see Dr. Valentina Lucks on 12/15 for PFTs to determine the severity of her COPD. Believe she could benefit from a long-acting daily regimen, but patient is not very interested in starting a new medication. Perhaps seeing PFT results would help in her decision making process. Has tried Spiriva and Anora with no perceived benefit. Last time I saw patient in Oct she was requiring 4L O2 and reporting increased SOB with exertion. She  appears to be more at her baseline at this visit.   DM2 (diabetes mellitus, type 2) A1C 6.5 today. Continue diet control. Do not need to check A1C unless patient insists.      Phill Myron, D.O. 08/05/2015, 4:33 PM PGY-1, [place]

## 2015-08-05 NOTE — Assessment & Plan Note (Addendum)
With chronic respiratory failure. O2 saturation stable today on 3L O2 via [place]. Patient to see Dr. Valentina Lucks on 12/15 for PFTs to determine the severity of her COPD. Believe she could benefit from a long-acting daily regimen, but patient is not very interested in starting a new medication. Perhaps seeing PFT results would help in her decision making process. Has tried Spiriva and Anora with no perceived benefit. Last time I saw patient in Oct she was requiring 4L O2 and reporting increased SOB with exertion. She appears to be more at her baseline at this visit.

## 2015-08-05 NOTE — Assessment & Plan Note (Signed)
A1C 6.5 today. Continue diet control. Do not need to check A1C unless patient insists.

## 2015-08-10 ENCOUNTER — Telehealth: Payer: Self-pay | Admitting: Oncology

## 2015-08-10 NOTE — Telephone Encounter (Signed)
Anita [name] left a message asking if Dr. Sondra Come had read her PET scan.  She is asking for a return call.

## 2015-08-11 ENCOUNTER — Ambulatory Visit: Payer: Commercial Managed Care - HMO | Admitting: Internal Medicine

## 2015-08-11 ENCOUNTER — Encounter: Payer: Self-pay | Admitting: Pharmacist

## 2015-08-11 ENCOUNTER — Ambulatory Visit (INDEPENDENT_AMBULATORY_CARE_PROVIDER_SITE_OTHER): Payer: Commercial Managed Care - HMO | Admitting: Pharmacist

## 2015-08-11 VITALS — Ht 61.0 in | Wt 231.6 lb

## 2015-08-11 DIAGNOSIS — J9611 Chronic respiratory failure with hypoxia: Secondary | ICD-10-CM | POA: Diagnosis not present

## 2015-08-11 NOTE — Addendum Note (Signed)
Addended by: Leavy Cella on: 08/11/2015 04:50 PM   Modules accepted: Orders

## 2015-08-11 NOTE — Patient Instructions (Signed)
Thanks for coming in today.   Lung function test showed slightly better than last time.     Please continue to use your insulin to help you feeling well when you are breathing while exerting yourself.   Next visit with Dr. Juleen China next year.

## 2015-08-11 NOTE — Progress Notes (Signed)
Patient ID: Anita [name], female   DOB: 19-Aug-1946, 69 y.o.   MRN: [id number] Reviewed: agree with Dr. Graylin Shiver documentation and management.

## 2015-08-11 NOTE — Progress Notes (Signed)
S:    Patient arrives walking slowly without assistance carrying oxygen tank and experiencing dyspnea with exertion.    Presents for lung function evaluation.  Patient reports breathing has been poor since being diagnosed with COPD 2 years ago.  She states she has also been using oxygen for approximately 2 years.    Patient reports she is no longer taking Spiriva or Advair as she understands that these will be of little help.  She is wearing her oxygen when out of the house.      She noted that her oncology regimen changed from Femara to Faslodex (fulvestrant) due to spreading tumor into her neck and lungs.   She continues on [place] as well at this time.   O: See Documentation Flowsheet - CAT/COPD for complete symptom scoring.  See "scanned report" or Documentation Flowsheet (discrete results - PFTs) for  Spirometry results. Patient provided good effort while attempting spirometry.  Minimally improved since spirometry done by pulmonology in October.    A/P: Spirometry evaluation without Bronchodilator reveals severe restrictive lung disease. Patient has been experiencing dyspnea with exertion and takking minimal inhalation therapy as she believes it does not help.  Suggested continue use of oxygen therapy at this time.  Reviewed results of pulmonary function tests.  Pt verbalized understanding of results and education.  Briefly visited today with PCP Dr. Juleen China today.   Follow up with Dr. Juleen China in March.  Total time in face to face counseling 25 minutes.

## 2015-08-11 NOTE — Assessment & Plan Note (Signed)
Spirometry evaluation without Bronchodilator reveals severe restrictive lung disease. Patient has been experiencing dyspnea with exertion and takking minimal inhalation therapy as she believes it does not help.  Suggested continue use of oxygen therapy at this time.  Reviewed results of pulmonary function tests.  Pt verbalized understanding of results and education.  Briefly visited today with PCP Dr. Juleen China today.   Follow up with Dr. Juleen China in March.  Total time in face to face counseling 25 minutes.  Continue primary management of lung function by Dr Halford Chessman.

## 2015-08-15 ENCOUNTER — Ambulatory Visit (HOSPITAL_BASED_OUTPATIENT_CLINIC_OR_DEPARTMENT_OTHER): Payer: Commercial Managed Care - HMO

## 2015-08-15 ENCOUNTER — Other Ambulatory Visit (HOSPITAL_BASED_OUTPATIENT_CLINIC_OR_DEPARTMENT_OTHER): Payer: Commercial Managed Care - HMO

## 2015-08-15 ENCOUNTER — Ambulatory Visit (HOSPITAL_BASED_OUTPATIENT_CLINIC_OR_DEPARTMENT_OTHER): Payer: Commercial Managed Care - HMO | Admitting: Hematology

## 2015-08-15 ENCOUNTER — Encounter: Payer: Self-pay | Admitting: Hematology

## 2015-08-15 ENCOUNTER — Telehealth: Payer: Self-pay | Admitting: Hematology

## 2015-08-15 VITALS — BP 131/56 | HR 106 | Temp 98.3°F | Resp 18 | Ht 61.0 in

## 2015-08-15 DIAGNOSIS — C50919 Malignant neoplasm of unspecified site of unspecified female breast: Secondary | ICD-10-CM

## 2015-08-15 DIAGNOSIS — C50911 Malignant neoplasm of unspecified site of right female breast: Secondary | ICD-10-CM

## 2015-08-15 DIAGNOSIS — N183 Chronic kidney disease, stage 3 unspecified: Secondary | ICD-10-CM

## 2015-08-15 DIAGNOSIS — D638 Anemia in other chronic diseases classified elsewhere: Secondary | ICD-10-CM

## 2015-08-15 DIAGNOSIS — J410 Simple chronic bronchitis: Secondary | ICD-10-CM

## 2015-08-15 DIAGNOSIS — Z5111 Encounter for antineoplastic chemotherapy: Secondary | ICD-10-CM

## 2015-08-15 DIAGNOSIS — C7951 Secondary malignant neoplasm of bone: Secondary | ICD-10-CM

## 2015-08-15 LAB — TECHNOLOGIST REVIEW

## 2015-08-15 LAB — CBC & DIFF AND RETIC
BASO%: 0.3 % (ref 0.0–2.0)
BASOS ABS: 0 10*3/uL (ref 0.0–0.1)
EOS ABS: 0.1 10*3/uL (ref 0.0–0.5)
EOS%: 3.1 % (ref 0.0–7.0)
HEMATOCRIT: 26.9 % — AB (ref 34.8–46.6)
HGB: 8.5 g/dL — ABNORMAL LOW (ref 11.6–15.9)
Immature Retic Fract: 21.7 % — ABNORMAL HIGH (ref 1.60–10.00)
LYMPH#: 0.5 10*3/uL — AB (ref 0.9–3.3)
LYMPH%: 16.2 % (ref 14.0–49.7)
MCH: 29 pg (ref 25.1–34.0)
MCHC: 31.6 g/dL (ref 31.5–36.0)
MCV: 91.8 fL (ref 79.5–101.0)
MONO#: 0.5 10*3/uL (ref 0.1–0.9)
MONO%: 16.5 % — ABNORMAL HIGH (ref 0.0–14.0)
NEUT#: 2.1 10*3/uL (ref 1.5–6.5)
NEUT%: 63.9 % (ref 38.4–76.8)
Platelets: 220 10*3/uL (ref 145–400)
RBC: 2.93 10*6/uL — ABNORMAL LOW (ref 3.70–5.45)
RDW: 19.1 % — AB (ref 11.2–14.5)
RETIC %: 2.86 % — AB (ref 0.70–2.10)
RETIC CT ABS: 83.8 10*3/uL (ref 33.70–90.70)
WBC: 3.3 10*3/uL — ABNORMAL LOW (ref 3.9–10.3)

## 2015-08-15 LAB — COMPREHENSIVE METABOLIC PANEL
ALT: 36 U/L (ref 0–55)
AST: 45 U/L — AB (ref 5–34)
Albumin: 3.2 g/dL — ABNORMAL LOW (ref 3.5–5.0)
Alkaline Phosphatase: 94 U/L (ref 40–150)
Anion Gap: 13 mEq/L — ABNORMAL HIGH (ref 3–11)
BUN: 27.7 mg/dL — AB (ref 7.0–26.0)
CALCIUM: 9.5 mg/dL (ref 8.4–10.4)
CHLORIDE: 100 meq/L (ref 98–109)
CO2: 25 meq/L (ref 22–29)
CREATININE: 2 mg/dL — AB (ref 0.6–1.1)
EGFR: 26 mL/min/{1.73_m2} — ABNORMAL LOW (ref >90)
GLUCOSE: 248 mg/dL — AB (ref 70–140)
Potassium: 3.4 mEq/L — ABNORMAL LOW (ref 3.5–5.1)
SODIUM: 138 meq/L (ref 136–145)
Total Bilirubin: 0.45 mg/dL (ref 0.20–1.20)
Total Protein: 7 g/dL (ref 6.4–8.3)

## 2015-08-15 MED ORDER — FULVESTRANT 250 MG/5ML IM SOLN
500.0000 mg | INTRAMUSCULAR | Status: DC
  Administered 2015-08-15: 500 mg via INTRAMUSCULAR
  Filled 2015-08-15: qty 10

## 2015-08-15 NOTE — Telephone Encounter (Signed)
per pof to sch pt pt appt-gave pt copy of avs °

## 2015-08-15 NOTE — Progress Notes (Signed)
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 6 mm groundglass nodule in the right upper lobe of lung.   10/10/2012 Initial Biopsy Right breast core needle biopsy showed invasive lobular carcinoma, grade 2. Left iliac crest bone biopsy showed metastatic carcinoma, morphology similar to breast biopsy.   10/14/2012 Initial Diagnosis Breast cancer metastasized to bone ([place])   11/2012 -  Anti-estrogen oral therapy Letrozole '1mg'$  daily, add palbociclib on 01/01/2014, dose modified to 2 weeks on and 1 week off with cycle 3   02/02/2014 - 02/24/2014 Radiation Therapy Palliative radiation to left and the right femur, 30 gray in 10 fractions, by Dr. Sondra [name]    09/01/2014 Imaging CT chest abdomen and pelvis showed several small pulmonary nodules are not changed. Stable widespread bone metastasis.   10/10/2014 Receptors her2 ER 100% positive, PR negative, HER-2 negative.   02/08/2015 Imaging PET scan showed a stable 8 mm right level III lymph node with SUV 8.8, no new neck adenopathy, stable diffuse osseous metastasis, stable for many nodules.   07/06/2015 Imaging PET scan showed overall interval disease progression, especially bone lesions, lung nodules appear unchanged in size, but RUL nodule demonstrated increased FDG uptake. stable level 3 R cervical nodes     PREVIOUS AND CURRENT THERAPY:   1. She started letrozole in 11/2012.  Added Palbociclib 125 mg daily for 21 days on and 7 days off on 01/01/2014 with disease progression. Dose modified to 2 weeks on 1 week off with cycle 3.   Letrozole changed to fulvestrant on 07/18/2015 due to disease progress, and Ibrance changed to '100mg'$  daily, 3 weeks on and one week off.  2. zometa added on 01/01/2014 for metastatic bone disease. Zometa held due to renal insufficiency and changed to [facility] because of skeletal disease progression and safe renal profile. 3. Started palliative XRT to the left and right femur (30 Gy in 10 fractions) on 02/02/2014 per Dr. Gery [name] which was finished on 02/24/2014.  INTERVAL HISTORY:  Anita [name] 69 y.o. female with a history of right breast cancer with metastases to the bone/lungs here for follow-up.   She has tolerated the fulvestrant injection well in the past month, no significant side effects. She is clinically stable, pain is mild and tolerable. She still has small wound on her legs, follows at wound clinic. No other new complains.  MEDICAL HISTORY: Past Medical History  Diagnosis Date  . Fibroid   . Morbid obesity ([place])   . 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 COPD (chronic obstructive pulmonary disease) ([place])   . Chronic diastolic CHF (congestive heart failure) ([place])   . NSTEMI (non-ST elevated myocardial infarction) [facility])     Cath November 2014 LAD 40% stenosis, first diagonal 80% stenosis, circumflex 20% stenosis, right coronary artery occluded. The EF was 35-40% at that time.  . Anemia     a. Adm 09/2012 with melena, Hgb 5.8 -> transfused. EGD/colonoscopy unrevealing.  . Breast cancer ([place])     a. Mets to bone. ER 100%/ PR 0%/Her2  neu negative.  [name] Ulcers of both lower legs ([place])   . Tobacco abuse   . Hyperlipidemia 02/11/2013  . Chronic respiratory failure ([place])     a. On O2 qhs. also portable O2.  . Chronic renal insufficiency   . Lesion of vocal cord     a. CT 05/2013 concerning for tumor.  . Shortness of breath   . Depression   . Diabetes mellitus without complication ([place])   . Anginal pain ([place])   . Dementia     very mild  .  Radiation 02/02/14-02/24/14    left and right femur 30 gray    ALLERGIES:  is allergic to omnipaque and benzene.  MEDICATIONS:    Medication List       This list is accurate as of: 08/15/15  8:12 AM.  Always use your most recent med list.               albuterol 108 (90 BASE) MCG/ACT inhaler  Commonly known as:  PROVENTIL HFA;VENTOLIN HFA  Inhale 2 puffs into the lungs every 6 (six) hours as needed for wheezing or shortness of breath.     aspirin 81 MG EC tablet  Take 1 tablet (81 mg total) by mouth daily.     atorvastatin 40 MG tablet  Commonly known as:  LIPITOR  Take 1 tablet (40 mg total) by mouth daily.     bag balm Oint ointment  Apply 1 g topically as needed for dry skin or irritation.     clotrimazole 1 % cream  Commonly known as:  LOTRIMIN  Apply 1 application topically daily.     docusate sodium 100 MG capsule  Commonly known as:  COLACE  Take 100 mg by mouth daily.     FASLODEX 250 MG/5ML injection  Generic drug:  fulvestrant  Inject 250 mg into the muscle once. One injection each buttock over 1-2 minutes. Warm prior to use.     feeding supplement (PRO-STAT SUGAR FREE 64) Liqd  Take 30 mLs by mouth 3 (three) times daily with meals.     isosorbide mononitrate 60 MG 24 hr tablet  Commonly known as:  IMDUR  Take 1 tablet (60 mg total) by mouth daily.     mirtazapine 15 MG tablet  Commonly known as:  REMERON  Take 1 tablet (15 mg total) by mouth at bedtime.     nitroGLYCERIN 0.4 MG SL tablet  Commonly known as:  NITROSTAT  Place 1 tablet (0.4 mg total) under the tongue every 5 (five) minutes as needed for chest pain.     NON FORMULARY  Place 3 L into the nose as needed (oxygen).     palbociclib 100 MG capsule  Commonly known as:  IBRANCE  Take 1 capsule (100 mg total) by mouth daily with breakfast. Take whole with food.     potassium chloride SA 20 MEQ tablet  Commonly known as:  K-DUR,KLOR-CON  Take 2 tablets (40 mEq total) by mouth 2 (two) times  daily.     torsemide 20 MG tablet  Commonly known as:  DEMADEX  Take 20-80 mg by mouth See admin instructions. Daughter states she takes '80mg'$  in the morning, then take '40mg'$  in the afternoon.     VITAMIN C PO  Take 500 mg by mouth daily.     VITAMIN D (CHOLECALCIFEROL) PO  Take 1,000 mg by mouth daily.        SURGICAL HISTORY:  Past Surgical History  Procedure Laterality Date  .  Colonoscopy, esophagogastroduodenoscopy (egd) and esophageal dilation N/A 10/13/2012    Procedure: colonoscopy and egd;  Surgeon: Wonda Horner, MD;  Location: [facility];  Service: Endoscopy;  Laterality: N/A;  . Left heart catheterization with coronary angiogram N/A 07/07/2013    Procedure: LEFT HEART CATHETERIZATION WITH CORONARY ANGIOGRAM;  Surgeon: Peter M Martinique, MD;  Location: [facility] CATH LAB;  Service: Cardiovascular;  Laterality: N/A;   REVIEW OF SYSTEMS:   Constitutional: Denies fevers, chills or abnormal weight loss Eyes: Denies blurriness of vision Ears, nose, mouth, throat, and face: Denies mucositis or sore throat Respiratory: Denies cough, (+) stable dyspnea, no wheezes Cardiovascular: Denies palpitation, chest discomfort or lower extremity swelling Gastrointestinal:  Denies nausea, heartburn or change in bowel habits Skin: Denies abnormal skin rashes Lymphatics: Denies new lymphadenopathy or easy bruising Neurological:Denies numbness, tingling or new weaknesses Behavioral/Psych: Mood is stable, no new changes  All other systems were reviewed with the patient and are negative.  PHYSICAL EXAMINATION: ECOG PERFORMANCE STATUS: 2-3  Blood pressure 131/56, pulse 106, temperature 98.3 F (36.8 C), temperature source Oral, resp. rate 18, height '5\' 1"'$  (1.549 m), SpO2 97 %.  GENERAL:alert, no distress and comfortable morbidly obese woman in wheelchair, chronically ill appearing SKIN: skin color, texture, turgor are normal; posterior legs with irritation and erythema with a small abrasion on the  posterior left thigh with mild TTP.  EYES: normal, Conjunctiva are pink and non-injected, sclera clear OROPHARYNX:no exudate, no erythema and lips, buccal mucosa, and tongue normal  NECK: supple, thyroid normal size, non-tender, without nodularity LYMPH:  no palpable lymphadenopathy in the cervical, axillary LUNGS: coarse breathing with slightly increased breathing rate HEART: regular rate & rhythm and no murmurs and 2+ lower extremity edema BREAST: deferred ABDOMEN:abdomen soft, non-tender and normal bowel sounds; obese Musculoskeletal:no cyanosis of digits and no clubbing; bilateral lower extremities are wrapped with gauze and a bandage up to mid calf. I can not see the wound in the right anterior tibia.  NEURO: alert & oriented x 3 with fluent speech, no focal motor/sensory deficits  LABORATORY DATA: CBC Latest Ref Rng 08/15/2015 08/01/2015 07/06/2015  WBC 3.9 - 10.3 10e3/uL 3.3(L) 3.4(L) 3.5(L)  Hemoglobin 11.6 - 15.9 g/dL 8.5(L) 8.6(L) 9.7(L)  Hematocrit 34.8 - 46.6 % 26.9(L) 26.7(L) 30.3(L)  Platelets 145 - 400 10e3/uL 220 259 260    CMP Latest Ref Rng 08/15/2015 08/01/2015 07/06/2015  Glucose 70 - 140 mg/dl 248(H) 141(H) 199(H)  BUN 7.0 - 26.0 mg/dL 27.7(H) 35.2(H) 31.1(H)  Creatinine 0.6 - 1.1 mg/dL 2.0(H) 1.8(H) 1.8(H)  Sodium 136 - 145 mEq/L 138 142 141  Potassium 3.5 - 5.1 mEq/L 3.4(L) 3.4(L) 3.9  Chloride 101 - 111 mmol/L - - -  CO2 22 - 29 mEq/L '25 24 24  '$ Calcium 8.4 - 10.4 mg/dL 9.5 8.9 9.4  Total Protein 6.4 - 8.3 g/dL 7.0 6.3(L) 6.5  Total Bilirubin 0.20 - 1.20 mg/dL 0.45 0.31 0.37  Alkaline Phos 40 - 150 U/L 94 96 113  AST 5 - 34 U/L 45(H) 19 28  ALT 0 - 55 U/L 36 16 22   PATHOLOGY REPORT  Diagnosis 10/10/2014 1. Bone, biopsy, left iliac crest - METASTATIC MAMMARY CARCINOMA, SEE COMMENT. 2. Breast, right, needle core biopsy - INVASIVE MAMMARY CARCINOMA, SEE COMMENT. Microscopic Comment 1. and 2. The breast biopsy demonstrates definitive morphologic features of  invasive mammary carcinoma. Although the grade of tumor is best assessed at resection, with these biopsies, the carcinoma is grade II. An E-Cadherin stain is pending and will  be reported as an addendum. The morphology of the metastatic carcinoma in part 2 is identical to the breast biopsy. Breast prognostic studies are pending (part 2) and will be reported as an addendum. The case was reviewed with Dr. Avis Epley who concurs. The case was discussed with Dr. Lamonte Sakai on 10/13/2012.  RADIOGRAPHIC STUDIES:  CT chest abdomen and pelvis 09/01/2014 IMPRESSION: Chest Impression:  1. Stable exam of thorax. 2. Several small smudgy pulmonary nodules are not changed. 3. Stable widespread bony metastasis pneumothorax.  Abdomen / Pelvis Impression:  1. Stable small periaortic retroperitoneal lymph node. 2. No evidence disease progression. 3. Stable widespread skeletal metastasis. 4. Left sided staghorn calculus again demonstrated. 5. Nodular focus in the right kidney at site of prior renal cyst is not changed  PET 07/04/2015 IMPRESSION: 1. Overall there has been an interval progression of disease compared with previous exam. 2. Most notably is the interval increase in FDG uptake associated with multi focal hypermetabolic bone lesions. 3. Similar appearance scratch set similar size and degree of FDG uptake associated with hypermetabolic right level 3 lymph node. There is a pulmonary nodule in the right upper lobe which demonstrates increase in FDG uptake compared with previous exam. Anatomically be pulmonary nodules all appear unchanged in size.   ASSESSMENT: 69 year old Caucasian female  PLAN:  1.  Metastatic breast cancer (ER pos/PR neg/ HER2 neg) with metastasis to bones and lungs.  -I reviewed her restaging PET scan from 07/04/2015 with pt in [place], which showed slight disease progression in the bones , lung metastasis are stable in size, but the largest lesion has increased FDG uptake.   She also noticed some new right-sided chest wall pain, possibly related to bone metastasis.  -Given the mild disease progression, I suggest we switch her antiestrogen therapy -I have changed her letrozole to fulvestrant injection, and continue Ibrance, but changed her Ibrance from 125 mg 2 weeks on, one week off, to 100 mg, 3 weeks on, 1 week off, she previously had neutropenia from 125 mg 3 weeks on regimen. -she is tolerating treatment well, continue -I will repeat her PET in 2 month   2. bone metastasis - continue Xgeva monthly  -Continue calcium and vitamin D  3. 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   --She has been seen by radiation oncology and followed by Dr. Sondra [name].  -She never had any biopsy, she follows up with Dr. Sondra [name], who does not think she need radiation.   4. Anemia of chronic disease  -- Past work up for reversible causes of anemia were negative.  -slightly worse moderate anemia, her hemoglobin is 8.5 today, she is not much symptomatic, We'll hold on transfusion, continue monitoring. -Repeated ferritin 255, serum iron 39 and saturation 14% in 11/2014 -She is not taking iron pill, could not tolerate.   5. CHF -clinically stable. She is on Lasix, nitrogylerin prn, losartan. Increase lasix prn and weight daily. Cards follow up.   6. Diabetes mellitus, type II. Diet controlled.   7. COPD: On albuterol prn. She remains on home oxygen.  8. Chronic renal insufficiency.  -Stable.  9: Bilateral leg wound -Worsening right lower extremity wound after her fall in August -She still follows up with his wound clinic once a week with dressing change -her right LE wound is healing well    All questions were answered. The patient knows to call the clinic with any problems, questions or concerns. We can certainly see the patient much sooner if necessary.  Plan -continue fulvestrant '500mg'$  monthly, injection  today                         -Start you brance I100 mg daily, 3 weeks on,  one-week off, when she receives refill, prescription will be sent out today  -I'll see her back in 4 weeks  -next xgeva injection in 4 weeks    I spent 25 minutes counseling the patient face to face. The total time spent in the appointment was 30 minutes.   Truitt Merle  08/15/2015

## 2015-08-16 LAB — CANCER ANTIGEN 27.29: CA 27.29: 74 U/mL — ABNORMAL HIGH (ref 0–39)

## 2015-08-24 ENCOUNTER — Other Ambulatory Visit: Payer: Self-pay | Admitting: *Deleted

## 2015-08-24 NOTE — Patient Outreach (Signed)
Covering for Erskin Burnet.  Call received from member, requesting to speak to assigned care manager.  She was informed that the assigned care manager was out of the office until 1/3.  This care manager inquired about any urgent concerns, member denies, stating that she received a letter from Mrs. Juleen China in the mail and was calling to continue involvement.  Accurate contact information received, will inform assigned care manager of conversation with member upon return.  Valente David, BSN, [place] [facility]  [facility] Care Manager [phone]

## 2015-08-28 ENCOUNTER — Telehealth: Payer: Self-pay | Admitting: Family Medicine

## 2015-08-28 NOTE — Telephone Encounter (Signed)
**  Emergency/ After hours line call**  Patient calls to report BG >300.  Denies symptoms at this time.  She just wanted to "let someone know".  She notes that she is not on medication outpatient and has been managing her sugars through diet.  She is wondering if she should be started on Metformin.  Relayed to her that we could not start meds over the phone but that I would be happy to schedule an appt with her PCP for discussion/ evaluation.  Patient agreeable.  Appt scheduled 1/17 @ 3pm.  Montel Vanderhoof M. Lajuana Ripple, DO PGY-2, [place] [facility]

## 2015-09-08 ENCOUNTER — Ambulatory Visit (INDEPENDENT_AMBULATORY_CARE_PROVIDER_SITE_OTHER): Payer: Commercial Managed Care - HMO | Admitting: Cardiology

## 2015-09-08 ENCOUNTER — Encounter: Payer: Self-pay | Admitting: Cardiology

## 2015-09-08 VITALS — BP 140/62 | HR 88 | Ht 62.0 in | Wt 209.9 lb

## 2015-09-08 DIAGNOSIS — I5031 Acute diastolic (congestive) heart failure: Secondary | ICD-10-CM

## 2015-09-08 MED ORDER — CLOTRIMAZOLE 1 % EX CREA: 1.0000 "application " | TOPICAL_CREAM | Freq: Every day | CUTANEOUS | Status: DC

## 2015-09-08 NOTE — Patient Instructions (Signed)
Medication Instructions:  Your physician recommends that you continue on your current medications as directed. Please refer to the Current Medication list given to you today.   Labwork: none  Testing/Procedures: none  Follow-Up: Your physician wants you to follow-up in: 6 months with Dr. Hochrein. You will receive a reminder letter in the mail two months in advance. If you don't receive a letter, please call our office to schedule the follow-up appointment.   Any Other Special Instructions Will Be Listed Below (If Applicable).     If you need a refill on your cardiac medications before your next appointment, please call your pharmacy.   

## 2015-09-08 NOTE — Progress Notes (Signed)
HPI  The patient presents for follow up after a hospitalization for treatment of dyspnea and anasarca.  She actually thinks she is doing well. She has stayed out of the hospital again since I last saw her.   She does have chronic oxygen.  She thinks her leg swelling is reduced. . She's not having any overt shortness of breath, PND or orthopnea. She sleeps in a recliner. She's not having any chest pressure, neck or arm discomfort.   She still attends classes.    Allergies  Allergen Reactions  . Omnipaque [Iohexol] Hives and Other (See Comments)    Pt claims she developed hives after given contrast  . Benzene Rash    Current Outpatient Prescriptions  Medication Sig Dispense Refill  . albuterol (PROVENTIL HFA;VENTOLIN HFA) 108 (90 BASE) MCG/ACT inhaler Inhale 2 puffs into the lungs every 6 (six) hours as needed for wheezing or shortness of breath. 3 Inhaler 3  . Ascorbic Acid (VITAMIN C PO) Take 500 mg by mouth daily.     [name] aspirin EC 81 MG EC tablet Take 1 tablet (81 mg total) by mouth daily. 30 tablet 3  . atorvastatin (LIPITOR) 40 MG tablet Take 1 tablet (40 mg total) by mouth daily. 90 tablet 3  . bag balm OINT ointment Apply 1 g topically as needed for dry skin or irritation. 300 g 11  . clotrimazole (LOTRIMIN) 1 % cream Apply 1 application topically daily. 113 g 3  . denosumab (XGEVA) 120 MG/1.7ML SOLN injection Inject 120 mg into the skin once. Monthly at [facility]    . docusate sodium (COLACE) 100 MG capsule Take 100 mg by mouth daily.    . fulvestrant (FASLODEX) 250 MG/5ML injection Inject 250 mg into the muscle once. One injection each buttock over 1-2 minutes. Warm prior to use.    . isosorbide mononitrate (IMDUR) 60 MG 24 hr tablet Take 1 tablet (60 mg total) by mouth daily. 90 tablet 3  . mirtazapine (REMERON) 15 MG tablet Take 1 tablet (15 mg total) by mouth at bedtime. 30 tablet 5  . nitroGLYCERIN (NITROSTAT) 0.4 MG SL tablet Place 1 tablet (0.4 mg total) under the tongue every 5  (five) minutes as needed for chest pain. 30 tablet 0  . NON FORMULARY Place 3 L into the nose as needed (oxygen).     . palbociclib (IBRANCE) 100 MG capsule Take 1 capsule (100 mg total) by mouth daily with breakfast. Take whole with food. 21 capsule 3  . potassium chloride SA (K-DUR,KLOR-CON) 20 MEQ tablet Take 2 tablets (40 mEq total) by mouth 2 (two) times daily. 120 tablet 6  . torsemide (DEMADEX) 20 MG tablet Take 20-80 mg by mouth See admin instructions. Daughter states she takes '80mg'$  in the morning, then take '40mg'$  in the afternoon.    [name] VITAMIN D, CHOLECALCIFEROL, PO Take 1,000 mg by mouth daily.      No current facility-administered medications for this visit.    Past Medical History  Diagnosis Date  . Fibroid   . Morbid obesity ([place])   . 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 COPD (chronic obstructive pulmonary disease) ([place])   . Chronic diastolic CHF (congestive heart failure) ([place])   . NSTEMI (non-ST elevated myocardial infarction) [facility])     Cath November 2014 LAD 40% stenosis, first diagonal 80% stenosis, circumflex 20% stenosis, right coronary artery occluded. The EF was 35-40% at that time.  . Anemia  a. Adm 09/2012 with melena, Hgb 5.8 -> transfused. EGD/colonoscopy unrevealing.  . Breast cancer ([place])     a. Mets to bone. ER 100%/ PR 0%/Her2 neu negative.  [name] Ulcers of both lower legs ([place])   . Tobacco abuse   . Hyperlipidemia 02/11/2013  . Chronic respiratory failure ([place])     a. On O2 qhs. also portable O2.  . Chronic renal insufficiency   . Lesion of vocal cord     a. CT 05/2013 concerning for tumor.  . Shortness of breath   . Depression   . Diabetes mellitus without complication ([place])   . Anginal pain ([place])   . Dementia     very mild  . Radiation 02/02/14-02/24/14    left and right femur 30 gray    Past Surgical History  Procedure Laterality Date  . Colonoscopy, esophagogastroduodenoscopy (egd) and esophageal dilation N/A  10/13/2012    Procedure: colonoscopy and egd;  Surgeon: Wonda Horner, MD;  Location: [place];  Service: Endoscopy;  Laterality: N/A;  . Left heart catheterization with coronary angiogram N/A 07/07/2013    Procedure: LEFT HEART CATHETERIZATION WITH CORONARY ANGIOGRAM;  Surgeon: Peter M Martinique, MD;  Location[facility] CATH LAB;  Service: Cardiovascular;  Laterality: N/A;    ROS:  As stated in the HPI and negative for all other systems.  PHYSICAL EXAM BP 140/62 mmHg  Pulse 88  Ht '5\' 2"'$  (1.575 m)  Wt 209 lb 14.4 oz (95.21 kg)  BMI 38.38 kg/m2 GEN:  No distress but pale and chronically ill appearing. NECK:  No jugular venous distention at 90 degrees, waveform within normal limits, carotid upstroke brisk and symmetric, no bruits, no thyromegaly LUNGS:  Few basilar crackles and BACK:  No CVA tenderness CHEST:  Unremarkable HEART:  S1 and S2 within normal limits, no S3, no S4, no clicks, no rubs, distant heart sounds murmurs ABD:  Positive bowel sounds normal in frequency in pitch, no bruits, no rebound, no guarding, unable to assess midline mass or bruit with the patient seated. EXT:  2 plus pulses throughout, mild bilateral lower extremity edema, no cyanosis no clubbing   ASSESSMENT AND PLAN   CHRONIC DIASTOLIC HF:  She seems to be near euvolemic today. Her labs were checked recently and are to be checked next week at the cancer clinic.  She will continue as above.  Her weight is actually down by about 20 lbs since I saw her.   CAD:  The patient has no new sypmtoms.  No further cardiovascular testing is indicated.  We will continue with aggressive risk reduction and meds as listed.   CKD:  This has been stable and is followed closely.  She reports that she is having labs drawn Monday.

## 2015-09-09 ENCOUNTER — Encounter (HOSPITAL_BASED_OUTPATIENT_CLINIC_OR_DEPARTMENT_OTHER): Payer: Commercial Managed Care - HMO | Attending: Internal Medicine

## 2015-09-09 DIAGNOSIS — I252 Old myocardial infarction: Secondary | ICD-10-CM | POA: Insufficient documentation

## 2015-09-09 DIAGNOSIS — L97811 Non-pressure chronic ulcer of other part of right lower leg limited to breakdown of skin: Secondary | ICD-10-CM | POA: Diagnosis not present

## 2015-09-09 DIAGNOSIS — Z923 Personal history of irradiation: Secondary | ICD-10-CM | POA: Diagnosis not present

## 2015-09-09 DIAGNOSIS — E11622 Type 2 diabetes mellitus with other skin ulcer: Secondary | ICD-10-CM | POA: Diagnosis not present

## 2015-09-09 DIAGNOSIS — I872 Venous insufficiency (chronic) (peripheral): Secondary | ICD-10-CM | POA: Insufficient documentation

## 2015-09-09 DIAGNOSIS — J449 Chronic obstructive pulmonary disease, unspecified: Secondary | ICD-10-CM | POA: Insufficient documentation

## 2015-09-09 DIAGNOSIS — Z9981 Dependence on supplemental oxygen: Secondary | ICD-10-CM | POA: Insufficient documentation

## 2015-09-09 DIAGNOSIS — I87333 Chronic venous hypertension (idiopathic) with ulcer and inflammation of bilateral lower extremity: Secondary | ICD-10-CM | POA: Insufficient documentation

## 2015-09-09 DIAGNOSIS — L97821 Non-pressure chronic ulcer of other part of left lower leg limited to breakdown of skin: Secondary | ICD-10-CM | POA: Insufficient documentation

## 2015-09-09 DIAGNOSIS — I509 Heart failure, unspecified: Secondary | ICD-10-CM | POA: Diagnosis not present

## 2015-09-09 LAB — GLUCOSE, CAPILLARY: GLUCOSE-CAPILLARY: 136 mg/dL — AB (ref 65–99)

## 2015-09-12 ENCOUNTER — Ambulatory Visit (HOSPITAL_COMMUNITY)
Admission: RE | Admit: 2015-09-12 | Discharge: 2015-09-12 | Disposition: A | Discharge status: Home / Self Care | Payer: Commercial Managed Care - HMO | Source: Ambulatory Visit | Attending: Hematology | Admitting: Hematology

## 2015-09-12 ENCOUNTER — Ambulatory Visit (HOSPITAL_BASED_OUTPATIENT_CLINIC_OR_DEPARTMENT_OTHER): Payer: Commercial Managed Care - HMO | Admitting: Hematology

## 2015-09-12 ENCOUNTER — Ambulatory Visit (HOSPITAL_BASED_OUTPATIENT_CLINIC_OR_DEPARTMENT_OTHER): Payer: Commercial Managed Care - HMO

## 2015-09-12 ENCOUNTER — Other Ambulatory Visit: Payer: Self-pay | Admitting: *Deleted

## 2015-09-12 ENCOUNTER — Telehealth: Payer: Self-pay | Admitting: Hematology

## 2015-09-12 ENCOUNTER — Encounter: Payer: Self-pay | Admitting: Hematology

## 2015-09-12 ENCOUNTER — Other Ambulatory Visit (HOSPITAL_BASED_OUTPATIENT_CLINIC_OR_DEPARTMENT_OTHER): Payer: Commercial Managed Care - HMO

## 2015-09-12 ENCOUNTER — Other Ambulatory Visit: Payer: Self-pay | Admitting: Cardiology

## 2015-09-12 VITALS — BP 138/59 | HR 99 | Temp 98.4°F | Resp 16 | Ht 62.0 in | Wt 229.1 lb

## 2015-09-12 DIAGNOSIS — C50911 Malignant neoplasm of unspecified site of right female breast: Secondary | ICD-10-CM

## 2015-09-12 DIAGNOSIS — C7800 Secondary malignant neoplasm of unspecified lung: Secondary | ICD-10-CM | POA: Diagnosis not present

## 2015-09-12 DIAGNOSIS — C7951 Secondary malignant neoplasm of bone: Secondary | ICD-10-CM | POA: Insufficient documentation

## 2015-09-12 DIAGNOSIS — D638 Anemia in other chronic diseases classified elsewhere: Secondary | ICD-10-CM

## 2015-09-12 DIAGNOSIS — E1122 Type 2 diabetes mellitus with diabetic chronic kidney disease: Secondary | ICD-10-CM

## 2015-09-12 DIAGNOSIS — Z5111 Encounter for antineoplastic chemotherapy: Secondary | ICD-10-CM

## 2015-09-12 DIAGNOSIS — C50919 Malignant neoplasm of unspecified site of unspecified female breast: Secondary | ICD-10-CM | POA: Insufficient documentation

## 2015-09-12 DIAGNOSIS — L97909 Non-pressure chronic ulcer of unspecified part of unspecified lower leg with unspecified severity: Secondary | ICD-10-CM

## 2015-09-12 DIAGNOSIS — I2510 Atherosclerotic heart disease of native coronary artery without angina pectoris: Secondary | ICD-10-CM

## 2015-09-12 DIAGNOSIS — N184 Chronic kidney disease, stage 4 (severe): Secondary | ICD-10-CM

## 2015-09-12 DIAGNOSIS — J410 Simple chronic bronchitis: Secondary | ICD-10-CM

## 2015-09-12 DIAGNOSIS — D631 Anemia in chronic kidney disease: Secondary | ICD-10-CM

## 2015-09-12 DIAGNOSIS — I83009 Varicose veins of unspecified lower extremity with ulcer of unspecified site: Secondary | ICD-10-CM

## 2015-09-12 DIAGNOSIS — I2583 Coronary atherosclerosis due to lipid rich plaque: Secondary | ICD-10-CM

## 2015-09-12 DIAGNOSIS — N183 Chronic kidney disease, stage 3 (moderate): Secondary | ICD-10-CM

## 2015-09-12 DIAGNOSIS — I878 Other specified disorders of veins: Secondary | ICD-10-CM

## 2015-09-12 LAB — CBC & DIFF AND RETIC
BASO%: 0.9 % (ref 0.0–2.0)
BASOS ABS: 0 10*3/uL (ref 0.0–0.1)
EOS%: 4 % (ref 0.0–7.0)
Eosinophils Absolute: 0.1 10*3/uL (ref 0.0–0.5)
HEMATOCRIT: 22.9 % — AB (ref 34.8–46.6)
HGB: 7.4 g/dL — ABNORMAL LOW (ref 11.6–15.9)
IMMATURE RETIC FRACT: 19.2 % — AB (ref 1.60–10.00)
LYMPH#: 0.3 10*3/uL — AB (ref 0.9–3.3)
LYMPH%: 14.2 % (ref 14.0–49.7)
MCH: 28.8 pg (ref 25.1–34.0)
MCHC: 32.3 g/dL (ref 31.5–36.0)
MCV: 89.1 fL (ref 79.5–101.0)
MONO#: 0.1 10*3/uL (ref 0.1–0.9)
MONO%: 5.3 % (ref 0.0–14.0)
NEUT#: 1.7 10*3/uL (ref 1.5–6.5)
NEUT%: 75.6 % (ref 38.4–76.8)
PLATELETS: 155 10*3/uL (ref 145–400)
RBC: 2.57 10*6/uL — AB (ref 3.70–5.45)
RDW: 20.5 % — AB (ref 11.2–14.5)
RETIC %: 3.16 % — AB (ref 0.70–2.10)
RETIC CT ABS: 81.21 10*3/uL (ref 33.70–90.70)
WBC: 2.3 10*3/uL — ABNORMAL LOW (ref 3.9–10.3)

## 2015-09-12 LAB — COMPREHENSIVE METABOLIC PANEL
ALT: 23 U/L (ref 0–55)
ANION GAP: 12 meq/L — AB (ref 3–11)
AST: 24 U/L (ref 5–34)
Albumin: 3.3 g/dL — ABNORMAL LOW (ref 3.5–5.0)
Alkaline Phosphatase: 86 U/L (ref 40–150)
BUN: 28.7 mg/dL — ABNORMAL HIGH (ref 7.0–26.0)
CALCIUM: 9.8 mg/dL (ref 8.4–10.4)
CHLORIDE: 103 meq/L (ref 98–109)
CO2: 23 meq/L (ref 22–29)
Creatinine: 2.1 mg/dL — ABNORMAL HIGH (ref 0.6–1.1)
EGFR: 23 mL/min/{1.73_m2} — ABNORMAL LOW (ref >90)
Glucose: 184 mg/dl — ABNORMAL HIGH (ref 70–140)
POTASSIUM: 3.7 meq/L (ref 3.5–5.1)
Sodium: 139 mEq/L (ref 136–145)
Total Bilirubin: 0.37 mg/dL (ref 0.20–1.20)
Total Protein: 6.7 g/dL (ref 6.4–8.3)

## 2015-09-12 LAB — TECHNOLOGIST REVIEW

## 2015-09-12 MED ORDER — FULVESTRANT 250 MG/5ML IM SOLN
500.0000 mg | INTRAMUSCULAR | Status: DC
  Administered 2015-09-12: 500 mg via INTRAMUSCULAR
  Filled 2015-09-12: qty 10

## 2015-09-12 MED ORDER — CLOTRIMAZOLE 1 % EX CREA: 1.0000 "application " | TOPICAL_CREAM | Freq: Every day | CUTANEOUS | Status: DC

## 2015-09-12 MED ORDER — DENOSUMAB 120 MG/1.7ML ~~LOC~~ SOLN
120.0000 mg | Freq: Once | SUBCUTANEOUS | Status: AC
  Administered 2015-09-12: 120 mg via SUBCUTANEOUS
  Filled 2015-09-12: qty 1.7

## 2015-09-12 NOTE — Telephone Encounter (Signed)
°*  STAT* If patient is at the pharmacy, call can be transferred to refill team.   1. Which medications need to be refilled? (please list name of each medication and dose if known) Lotrimin 1% cream   2. Which pharmacy/location (including street and city if local pharmacy) is medication to be sent to?[facility] pharmacy   3. Do they need a 30 day or 90 day supply?

## 2015-09-12 NOTE — Telephone Encounter (Signed)
GAVE PATIENT AVS REPORT AND APPOINTMENTS FOR January AND February  °

## 2015-09-12 NOTE — Progress Notes (Signed)
[place], DO [place] [address]  CHIEF COMPLAIN: follow up metastatic breast cancer     Breast cancer metastasized to bone ([place])   10/08/2012 Imaging CT chest, abdomen and pelvis showed extensive sclerotic lesions throughout the bones, 4.9 x 3.4 cm soft tissue versus hematoma anterior to the spleen. 6 mm groundglass nodule in the right upper lobe of lung.   10/10/2012 Initial Biopsy Right breast core needle biopsy showed invasive lobular carcinoma, grade 2. Left iliac crest bone biopsy showed metastatic carcinoma, morphology similar to breast biopsy.   10/14/2012 Initial Diagnosis Breast cancer metastasized to bone ([place])   11/2012 -  Anti-estrogen oral therapy Letrozole '1mg'$  daily, add palbociclib on 01/01/2014, dose modified to 2 weeks on and 1 week off with cycle 3   02/02/2014 - 02/24/2014 Radiation Therapy Palliative radiation to left and the right femur, 30 gray in 10 fractions, by Dr. Sondra Come    09/01/2014 Imaging CT chest abdomen and pelvis showed several small pulmonary nodules are not changed. Stable widespread bone metastasis.   10/10/2014 Receptors her2 ER 100% positive, PR negative, HER-2 negative.   02/08/2015 Imaging PET scan showed a stable 8 mm right level III lymph node with SUV 8.8, no new neck adenopathy, stable diffuse osseous metastasis, stable for many nodules.   07/06/2015 Imaging PET scan showed overall interval disease progression, especially bone lesions, lung nodules appear unchanged in size, but RUL nodule demonstrated increased FDG uptake. stable level 3 R cervical nodes     PREVIOUS AND CURRENT THERAPY:   1. She started letrozole in 11/2012.  Added Palbociclib 125 mg daily for 21 days on and 7 days off on 01/01/2014 with disease progression. Dose modified to 2 weeks on 1 week off with cycle 3.  Letrozole changed to fulvestrant on 07/18/2015 due to disease progress, and Ibrance changed to '100mg'$   daily, 3 weeks on and one week off.  2. zometa added on 01/01/2014 for metastatic bone disease. Zometa held due to renal insufficiency and changed to [facility] because of skeletal disease progression and safe renal profile. 3. Started palliative XRT to the left and right femur (30 Gy in 10 fractions) on 02/02/2014 per Dr. Gery Pray which was finished on 02/24/2014.  INTERVAL HISTORY:  Anita [name] 70 y.o. female with a history of right breast cancer with metastases to the bone/lungs here for follow-up.  She is accompani by dater the [facility].  She  Is clinically stable overall. Her daughter does think she has slightly increased fatigue and dyspnea on exertion. She still able to go to school as usual,   And use oxygen as needed.  She complains about dry skin,and she scratches, which causes small skin ulcers.  She still dose to wound clinic for dressing change of the ulcers on her legs, which are slowly getting better.    She just finished 3-weeks of Ibrance '100mg'$  daily, and will have a week off from tomorrow.   MEDICAL HISTORY: Past Medical History  Diagnosis Date  . Fibroid   . Morbid obesity ([place])   . 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 COPD (chronic obstructive pulmonary disease) ([place])   . Chronic diastolic CHF (congestive heart failure) ([place])   . NSTEMI (non-ST elevated myocardial infarction) [facility])     Cath November 2014 LAD 40% stenosis, first diagonal 80% stenosis, circumflex 20% stenosis, right coronary artery occluded.  The EF was 35-40% at that time.  . Anemia     a. Adm 09/2012 with melena, Hgb 5.8 -> transfused. EGD/colonoscopy unrevealing.  . Breast cancer ([place])     a. Mets to bone. ER 100%/ PR 0%/Her2 neu negative.  [name] Ulcers of both lower legs ([place])   . Tobacco abuse   . Hyperlipidemia 02/11/2013  . Chronic respiratory failure ([place])     a. On O2 qhs. also portable O2.  . Chronic renal insufficiency   . Lesion of vocal cord     a. CT  05/2013 concerning for tumor.  . Shortness of breath   . Depression   . Diabetes mellitus without complication ([place])   . Anginal pain ([place])   . Dementia     very mild  . Radiation 02/02/14-02/24/14    left and right femur 30 gray    ALLERGIES:  is allergic to omnipaque and benzene.  MEDICATIONS:    Medication List       This list is accurate as of: 09/12/15  3:49 PM.  Always use your most recent med list.               albuterol 108 (90 Base) MCG/ACT inhaler  Commonly known as:  PROVENTIL HFA;VENTOLIN HFA  Inhale 2 puffs into the lungs every 6 (six) hours as needed for wheezing or shortness of breath.     aspirin 81 MG EC tablet  Take 1 tablet (81 mg total) by mouth daily.     atorvastatin 40 MG tablet  Commonly known as:  LIPITOR  Take 1 tablet (40 mg total) by mouth daily.     bag balm Oint ointment  Apply 1 g topically as needed for dry skin or irritation.     clotrimazole 1 % cream  Commonly known as:  LOTRIMIN  Apply 1 application topically daily.     docusate sodium 100 MG capsule  Commonly known as:  COLACE  Take 100 mg by mouth daily.     FASLODEX 250 MG/5ML injection  Generic drug:  fulvestrant  Inject 250 mg into the muscle once. One injection each buttock over 1-2 minutes. Warm prior to use.     isosorbide mononitrate 60 MG 24 hr tablet  Commonly known as:  IMDUR  Take 1 tablet (60 mg total) by mouth daily.     mirtazapine 15 MG tablet  Commonly known as:  REMERON  Take 1 tablet (15 mg total) by mouth at bedtime.     nitroGLYCERIN 0.4 MG SL tablet  Commonly known as:  NITROSTAT  Place 1 tablet (0.4 mg total) under the tongue every 5 (five) minutes as needed for chest pain.     NON FORMULARY  Place 3 L into the nose as needed (oxygen).     palbociclib 100 MG capsule  Commonly known as:  IBRANCE  Take 1 capsule (100 mg total) by mouth daily with breakfast. Take whole with food.     potassium chloride SA 20 MEQ tablet  Commonly known as:   K-DUR,KLOR-CON  Take 2 tablets (40 mEq total) by mouth 2 (two) times daily.     torsemide 20 MG tablet  Commonly known as:  DEMADEX  Take 20-80 mg by mouth See admin instructions. Daughter states she takes '80mg'$  in the morning, then take '40mg'$  in the afternoon.     VITAMIN C PO  Take 500 mg by mouth daily.     VITAMIN D (CHOLECALCIFEROL) PO  Take 1,000 mg by  mouth daily.     XGEVA 120 MG/1.7ML Soln injection  Generic drug:  denosumab  Inject 120 mg into the skin once. Monthly at [facility]        SURGICAL HISTORY:  Past Surgical History  Procedure Laterality Date  . Colonoscopy, esophagogastroduodenoscopy (egd) and esophageal dilation N/A 10/13/2012    Procedure: colonoscopy and egd;  Surgeon: Wonda Horner, MD;  Location: [facility];  Service: Endoscopy;  Laterality: N/A;  . Left heart catheterization with coronary angiogram N/A 07/07/2013    Procedure: LEFT HEART CATHETERIZATION WITH CORONARY ANGIOGRAM;  Surgeon: Peter M Martinique, MD;  Location: [facility] CATH LAB;  Service: Cardiovascular;  Laterality: N/A;   REVIEW OF SYSTEMS:   Constitutional: Denies fevers, chills or abnormal weight loss Eyes: Denies blurriness of vision Ears, nose, mouth, throat, and face: Denies mucositis or sore throat Respiratory: Denies cough, (+) stable dyspnea, no wheezes Cardiovascular: Denies palpitation, chest discomfort or lower extremity swelling Gastrointestinal:  Denies nausea, heartburn or change in bowel habits Skin: Denies abnormal skin rashes Lymphatics: Denies new lymphadenopathy or easy bruising Neurological:Denies numbness, tingling or new weaknesses Behavioral/Psych: Mood is stable, no new changes  All other systems were reviewed with the patient and are negative.  PHYSICAL EXAMINATION: ECOG PERFORMANCE STATUS: 2-3 Blood pressure 138/59, pulse 99, temperature 98.4 F (36.9 C), temperature source Oral, resp. rate 16, height '5\' 2"'$  (1.575 m), weight 229 lb 1.6 oz (103.919 kg), SpO2 99  %. GENERAL:alert, no distress and comfortable morbidly obese woman in wheelchair, chronically ill appearing SKIN: skin color, texture, turgor are normal; posterior legs with irritation and erythema with a small abrasion on the posterior left thigh with mild TTP.  EYES: normal, Conjunctiva are pink and non-injected, sclera clear OROPHARYNX:no exudate, no erythema and lips, buccal mucosa, and tongue normal  NECK: supple, thyroid normal size, non-tender, without nodularity LYMPH:  no palpable lymphadenopathy in the cervical, axillary LUNGS: coarse breathing with slightly increased breathing rate HEART: regular rate & rhythm and no murmurs and 2+ lower extremity edema BREAST: deferred ABDOMEN:abdomen soft, non-tender and normal bowel sounds; obese Musculoskeletal:no cyanosis of digits and no clubbing; bilateral lower extremities are wrapped with gauze and a bandage up to mid calf. I can not see the wound in the right anterior tibia.  NEURO: alert & oriented x 3 with fluent speech, no focal motor/sensory deficits  LABORATORY DATA: CBC Latest Ref Rng 09/12/2015 08/15/2015 08/01/2015  WBC 3.9 - 10.3 10e3/uL 2.3(L) 3.3(L) 3.4(L)  Hemoglobin 11.6 - 15.9 g/dL 7.4(L) 8.5(L) 8.6(L)  Hematocrit 34.8 - 46.6 % 22.9(L) 26.9(L) 26.7(L)  Platelets 145 - 400 10e3/uL 155 220 259    CMP Latest Ref Rng 08/15/2015 08/01/2015 07/06/2015  Glucose 70 - 140 mg/dl 248(H) 141(H) 199(H)  BUN 7.0 - 26.0 mg/dL 27.7(H) 35.2(H) 31.1(H)  Creatinine 0.6 - 1.1 mg/dL 2.0(H) 1.8(H) 1.8(H)  Sodium 136 - 145 mEq/L 138 142 141  Potassium 3.5 - 5.1 mEq/L 3.4(L) 3.4(L) 3.9  Chloride 101 - 111 mmol/L - - -  CO2 22 - 29 mEq/L '25 24 24  '$ Calcium 8.4 - 10.4 mg/dL 9.5 8.9 9.4  Total Protein 6.4 - 8.3 g/dL 7.0 6.3(L) 6.5  Total Bilirubin 0.20 - 1.20 mg/dL 0.45 0.31 0.37  Alkaline Phos 40 - 150 U/L 94 96 113  AST 5 - 34 U/L 45(H) 19 28  ALT 0 - 55 U/L 36 16 22   ANC 1.7 today   PATHOLOGY REPORT  Diagnosis 10/10/2014 1. Bone,  biopsy, left iliac crest - METASTATIC MAMMARY  CARCINOMA, SEE COMMENT. 2. Breast, right, needle core biopsy - INVASIVE MAMMARY CARCINOMA, SEE COMMENT. Microscopic Comment 1. and 2. The breast biopsy demonstrates definitive morphologic features of invasive mammary carcinoma. Although the grade of tumor is best assessed at resection, with these biopsies, the carcinoma is grade II. An E-Cadherin stain is pending and will be reported as an addendum. The morphology of the metastatic carcinoma in part 2 is identical to the breast biopsy. Breast prognostic studies are pending (part 2) and will be reported as an addendum. The case was reviewed with Dr. Avis Epley who concurs. The case was discussed with Dr. Lamonte Sakai on 10/13/2012.  RADIOGRAPHIC STUDIES:  CT chest abdomen and pelvis 09/01/2014 IMPRESSION: Chest Impression:  1. Stable exam of thorax. 2. Several small smudgy pulmonary nodules are not changed. 3. Stable widespread bony metastasis pneumothorax.  Abdomen / Pelvis Impression:  1. Stable small periaortic retroperitoneal lymph node. 2. No evidence disease progression. 3. Stable widespread skeletal metastasis. 4. Left sided staghorn calculus again demonstrated. 5. Nodular focus in the right kidney at site of prior renal cyst is not changed  PET 07/04/2015 IMPRESSION: 1. Overall there has been an interval progression of disease compared with previous exam. 2. Most notably is the interval increase in FDG uptake associated with multi focal hypermetabolic bone lesions. 3. Similar appearance scratch set similar size and degree of FDG uptake associated with hypermetabolic right level 3 lymph node. There is a pulmonary nodule in the right upper lobe which demonstrates increase in FDG uptake compared with previous exam. Anatomically be pulmonary nodules all appear unchanged in size.   ASSESSMENT: 70 year old Caucasian female  PLAN:  1.  Metastatic breast cancer (ER pos/PR neg/ HER2 neg)  with metastasis to bones and lungs, possible right cervical nodes.  -I reviewed her restaging PET scan from 07/04/2015 with pt in [place], which showed slight disease progression in the bones , lung metastasis are stable in size, but the largest lesion has increased FDG uptake.  She also noticed some new right-sided chest wall pain, possibly related to bone metastasis.  -Given the mild disease progression, I have changed her antiestrogen therapy from letrozole to fulvestrant injection, and continue Ibrance, but changed her Ibrance from 125 mg 2 weeks on, one week off, to 100 mg, 3 weeks on, 1 week off. -she is tolerating treatment well, continue - lab reviewed, she has mild  Leukopenia, and see normal, and worsening anemia, probably related to [place]  -I will repeat her PET in early March   2. bone metastasis - continue Xgeva monthly  -Continue calcium and vitamin D  3. 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   --She has been seen by radiation oncology and followed by Dr. Sondra Come.  -She never had any biopsy, she follows up with Dr. Sondra Come, who does not think she need radiation.   4. Anemia of chronic disease (CKD and malignancy)  -- Past work up for reversible causes of anemia were negative.  -slightly worse moderate anemia, her hemoglobin is 7.4 today, probably related to her CKD and Ibrance,  she has slightly worse fatigue, we'll transfuse 2 units of RBC this week. -Repeated ferritin 255, serum iron 39 and saturation 14% in 11/2014, will repeat on next visit  -She is not taking iron pill, could not tolerate.  -I do not recommend ESA, due to her underline malignancy   5. CHF -clinically stable. She is on Lasix, nitrogylerin prn, losartan. Increase lasix prn and weight daily. Cards follow up.   6. Diabetes mellitus, type II.  Diet controlled.   7. COPD: On albuterol prn. She remains on home oxygen.  8. Chronic renal insufficiency, stage IV   -Stable.  9: Bilateral leg wound -Worsening right  lower extremity wound after her fall in August -She still follows up with his wound clinic once a week with dressing change -her right LE wound is healing well    All questions were answered. The patient knows to call the clinic with any problems, questions or concerns. We can certainly see the patient much sooner if necessary.  Plan -blood transfusion this week  -continue fulvestrant '500mg'$  and Xgeva monthly, injections today                         -she will start Ibrance '100mg'$  daily 3 weeks on 1 week off, in one week  -I'll see her back in 4 weeks with lab, including CBC, CMP and iron study  -will order PET scan on next visit   I spent 25 minutes counseling the patient face to face. The total time spent in the appointment was 30 minutes.   Truitt Merle  09/12/2015

## 2015-09-13 ENCOUNTER — Encounter: Payer: Self-pay | Admitting: Internal Medicine

## 2015-09-13 ENCOUNTER — Ambulatory Visit (INDEPENDENT_AMBULATORY_CARE_PROVIDER_SITE_OTHER): Payer: Commercial Managed Care - HMO | Admitting: Internal Medicine

## 2015-09-13 VITALS — BP 130/58 | HR 110 | Temp 98.4°F | Ht 62.0 in | Wt 226.6 lb

## 2015-09-13 DIAGNOSIS — E1122 Type 2 diabetes mellitus with diabetic chronic kidney disease: Secondary | ICD-10-CM

## 2015-09-13 DIAGNOSIS — N183 Chronic kidney disease, stage 3 (moderate): Secondary | ICD-10-CM | POA: Diagnosis not present

## 2015-09-13 LAB — CANCER ANTIGEN 27-29 (PARALLEL TESTING): CA 27.29: 82 U/mL — ABNORMAL HIGH (ref 0–39)

## 2015-09-13 LAB — GLUCOSE, CAPILLARY: Glucose-Capillary: 216 mg/dL — ABNORMAL HIGH (ref 65–99)

## 2015-09-13 LAB — CANCER ANTIGEN 27.29: CA 27.29: 68.8 U/mL — ABNORMAL HIGH (ref 0.0–38.6)

## 2015-09-13 NOTE — Assessment & Plan Note (Signed)
Patient has been diet controlled with HgB A1C ~6.5 for the past couple years. CBG in office today 216 postprandial. Patient is asymptomatic. She is concerned about these elevated blood glucoses but I am reassured by her report of mostly normal CBGs at home and consistently stable A1C. Given her history of CKD as well as metastatic cancer, I would like to avoid starting metformin in this patient. Devised a plan that we would closely monitor her blood sugar. Recommended that she check only fasting CBGs every 3-4 days and if they are consistently >150 we could consider medication therapy. Calculated her CrCl to be 41.6.  Discussed case with pharmacy (Dr. Valentina Lucks) who felt that a small dose of metformin would be relatively safe in this patient. If she calls back with reported high fasting CBGs will start very low dose of Metformin 250 or 500 mg daily.

## 2015-09-13 NOTE — Progress Notes (Signed)
Subjective:    Anita [name] - 70 y.o. female MRN [id number]  Date of birth: 1945/10/31  HPI  Anita [name] is here for diabetes follow up. Patient reports that she had an elevated CBG to 330s a few weeks ago. Since then CBGs have been "coming down" and she recently had a CBG of 136 postprandial. She denies symptoms of hyperglycemia. Insistent that we check a blood glucose at the office today. Patient is very interested in starting a medication for her diabetes.      -  reports that she quit smoking about 2 years ago. Her smoking use included Cigarettes. She started smoking about 54 years ago. She has a 100 pack-year smoking history. She has never used smokeless tobacco. - Review of Systems: Per HPI. - Past Medical History: Patient Active Problem List   Diagnosis Date Noted  . Palliative care encounter   . Wound cellulitis   . Severe sepsis ([place]) 05/12/2015  . Sepsis ([place]) 05/12/2015  . AKI (acute kidney injury) ([place]) 05/12/2015  . Leg wound, right 05/12/2015  . Laceration   . Absolute anemia   . CAD in native artery   . Venous stasis ulcer ([place])   . COLD (chronic obstructive lung disease) ([place])   . Leg laceration 04/04/2015  . Laceration of lower extremity 04/04/2015  . Venous stasis of lower extremity 12/05/2014  . Intertrigo 02/19/2014  . 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 Hyperlipidemia 02/11/2013  . Breast cancer metastasized to bone ([place]) 10/14/2012  . Chronic respiratory failure ([place]) 10/06/2012  . Chronic diastolic heart failure ([place]) 10/06/2012  . CAD (coronary artery disease) 10/06/2012  . CKD (chronic kidney disease), stage IV ([place]) 10/06/2012  . Anemia of chronic disease 10/06/2012  . Breast mass, right 10/06/2012  . History of tobacco abuse 10/06/2012  . COPD (chronic obstructive pulmonary disease) ([place]) 09/27/2012  . DM2 (diabetes mellitus, type 2) ([place]) 09/26/2012   - Medications: reviewed and updated Current Outpatient Prescriptions  Medication Sig  Dispense Refill  . albuterol (PROVENTIL HFA;VENTOLIN HFA) 108 (90 BASE) MCG/ACT inhaler Inhale 2 puffs into the lungs every 6 (six) hours as needed for wheezing or shortness of breath. 3 Inhaler 3  . Ascorbic Acid (VITAMIN C PO) Take 500 mg by mouth daily.     [name] aspirin EC 81 MG EC tablet Take 1 tablet (81 mg total) by mouth daily. 30 tablet 3  . atorvastatin (LIPITOR) 40 MG tablet Take 1 tablet (40 mg total) by mouth daily. 90 tablet 3  . bag balm OINT ointment Apply 1 g topically as needed for dry skin or irritation. 300 g 11  . clotrimazole (LOTRIMIN) 1 % cream Apply 1 application topically daily. 113 g 3  . denosumab (XGEVA) 120 MG/1.7ML SOLN injection Inject 120 mg into the skin once. Monthly at [facility]    . docusate sodium (COLACE) 100 MG capsule Take 100 mg by mouth daily.    . fulvestrant (FASLODEX) 250 MG/5ML injection Inject 250 mg into the muscle once. One injection each buttock over 1-2 minutes. Warm prior to use.    . isosorbide mononitrate (IMDUR) 60 MG 24 hr tablet Take 1 tablet (60 mg total) by mouth daily. 90 tablet 3  . mirtazapine (REMERON) 15 MG tablet Take 1 tablet (15 mg total) by mouth at bedtime. 30 tablet 5  . nitroGLYCERIN (NITROSTAT) 0.4 MG SL tablet Place 1 tablet (0.4 mg total) under the tongue every 5 (five) minutes as needed for chest pain. (Patient  not taking: Reported on 09/12/2015) 30 tablet 0  . NON FORMULARY Place 3 L into the nose as needed (oxygen).     . palbociclib (IBRANCE) 100 MG capsule Take 1 capsule (100 mg total) by mouth daily with breakfast. Take whole with food. 21 capsule 3  . potassium chloride SA (K-DUR,KLOR-CON) 20 MEQ tablet Take 2 tablets (40 mEq total) by mouth 2 (two) times daily. 120 tablet 6  . torsemide (DEMADEX) 20 MG tablet Take 20-80 mg by mouth See admin instructions. Daughter states she takes 80mg  in the morning, then take 40mg  in the afternoon.    [name] VITAMIN D, CHOLECALCIFEROL, PO Take 1,000 mg by mouth daily.      No current  facility-administered medications for this visit.       Objective:   Physical Exam BP 130/58 mmHg  Pulse 110  Temp(Src) 98.4 F (36.9 C) (Oral)  Ht 5\' 2"  (1.575 m)  Wt 226 lb 9.6 oz (102.785 kg)  BMI 41.44 kg/m2  SpO2 96% Gen: NAD, alert, cooperative with exam CV: RRR. No murmurs appreciated.  Resp: On 3L O2 by [place]. Mild coarse breath sounds bilaterally. Increased WOB when speaking long sentences.       Assessment & Plan:   DM2 (diabetes mellitus, type 2) ([place]) Patient has been diet controlled with HgB A1C ~6.5 for the past couple years. CBG in office today 216 postprandial. Patient is asymptomatic. She is concerned about these elevated blood glucoses but I am reassured by her report of mostly normal CBGs at home and consistently stable A1C. Given her history of CKD as well as metastatic cancer, I would like to avoid starting metformin in this patient. Devised a plan that we would closely monitor her blood sugar. Recommended that she check only fasting CBGs every 3-4 days and if they are consistently >150 we could consider medication therapy. Calculated her CrCl to be 41.6.  Discussed case with pharmacy (Dr. Valentina Lucks) who felt that a small dose of metformin would be relatively safe in this patient. If she calls back with reported high fasting CBGs will start very low dose of Metformin 250 or 500 mg daily.      Phill Myron, D.O. 09/13/2015, 4:12 PM PGY-1, [place]

## 2015-09-14 ENCOUNTER — Telehealth: Payer: Self-pay | Admitting: *Deleted

## 2015-09-14 ENCOUNTER — Other Ambulatory Visit: Payer: Self-pay | Admitting: Internal Medicine

## 2015-09-14 DIAGNOSIS — N183 Chronic kidney disease, stage 3 unspecified: Secondary | ICD-10-CM

## 2015-09-14 NOTE — Telephone Encounter (Signed)
"  How long does it take to receive a blood transfusion?  I need to tell my ride."  Advised three hours for one unit and five to six for two units.  Already scheduled with type and crossmatch scheduled in advance.  Advised to expect to be here until 2:00 pm.  No further questions.

## 2015-09-14 NOTE — Telephone Encounter (Signed)
ADDED LAB TO 2/14 F/U - PATIENT TO BE GIVEN NEW SCHEDULE TOMORROW.

## 2015-09-15 ENCOUNTER — Other Ambulatory Visit (HOSPITAL_BASED_OUTPATIENT_CLINIC_OR_DEPARTMENT_OTHER): Payer: Commercial Managed Care - HMO

## 2015-09-15 ENCOUNTER — Other Ambulatory Visit: Payer: Commercial Managed Care - HMO

## 2015-09-15 DIAGNOSIS — C7951 Secondary malignant neoplasm of bone: Principal | ICD-10-CM

## 2015-09-15 DIAGNOSIS — N184 Chronic kidney disease, stage 4 (severe): Secondary | ICD-10-CM | POA: Diagnosis not present

## 2015-09-15 DIAGNOSIS — C50919 Malignant neoplasm of unspecified site of unspecified female breast: Secondary | ICD-10-CM | POA: Diagnosis not present

## 2015-09-15 DIAGNOSIS — D631 Anemia in chronic kidney disease: Secondary | ICD-10-CM

## 2015-09-15 DIAGNOSIS — D638 Anemia in other chronic diseases classified elsewhere: Secondary | ICD-10-CM

## 2015-09-15 LAB — CBC & DIFF AND RETIC
BASO%: 0.6 % (ref 0.0–2.0)
Basophils Absolute: 0 10*3/uL (ref 0.0–0.1)
EOS%: 3.6 % (ref 0.0–7.0)
Eosinophils Absolute: 0.1 10*3/uL (ref 0.0–0.5)
HCT: 22.4 % — ABNORMAL LOW (ref 34.8–46.6)
HGB: 7.1 g/dL — ABNORMAL LOW (ref 11.6–15.9)
Immature Retic Fract: 22.7 % — ABNORMAL HIGH (ref 1.60–10.00)
LYMPH%: 11.4 % — AB (ref 14.0–49.7)
MCH: 28.4 pg (ref 25.1–34.0)
MCHC: 31.7 g/dL (ref 31.5–36.0)
MCV: 89.6 fL (ref 79.5–101.0)
MONO#: 0.2 10*3/uL (ref 0.1–0.9)
MONO%: 9.6 % (ref 0.0–14.0)
NEUT%: 74.8 % (ref 38.4–76.8)
NEUTROS ABS: 1.3 10*3/uL — AB (ref 1.5–6.5)
Platelets: 135 10*3/uL — ABNORMAL LOW (ref 145–400)
RBC: 2.5 10*6/uL — AB (ref 3.70–5.45)
RDW: 21 % — ABNORMAL HIGH (ref 11.2–14.5)
Retic %: 3.17 % — ABNORMAL HIGH (ref 0.70–2.10)
Retic Ct Abs: 79.25 10*3/uL (ref 33.70–90.70)
WBC: 1.7 10*3/uL — AB (ref 3.9–10.3)
lymph#: 0.2 10*3/uL — ABNORMAL LOW (ref 0.9–3.3)

## 2015-09-15 LAB — COMPREHENSIVE METABOLIC PANEL
ALT: 31 U/L (ref 0–55)
AST: 43 U/L — ABNORMAL HIGH (ref 5–34)
Albumin: 3.2 g/dL — ABNORMAL LOW (ref 3.5–5.0)
Alkaline Phosphatase: 86 U/L (ref 40–150)
Anion Gap: 11 mEq/L (ref 3–11)
BUN: 22.9 mg/dL (ref 7.0–26.0)
CO2: 26 meq/L (ref 22–29)
Calcium: 8.8 mg/dL (ref 8.4–10.4)
Chloride: 103 mEq/L (ref 98–109)
Creatinine: 1.5 mg/dL — ABNORMAL HIGH (ref 0.6–1.1)
EGFR: 35 mL/min/{1.73_m2} — AB (ref >90)
GLUCOSE: 133 mg/dL (ref 70–140)
POTASSIUM: 3.3 meq/L — AB (ref 3.5–5.1)
SODIUM: 140 meq/L (ref 136–145)
Total Bilirubin: 0.47 mg/dL (ref 0.20–1.20)
Total Protein: 6.7 g/dL (ref 6.4–8.3)

## 2015-09-15 LAB — ABO/RH: ABO/RH(D): A POS

## 2015-09-16 DIAGNOSIS — I87333 Chronic venous hypertension (idiopathic) with ulcer and inflammation of bilateral lower extremity: Secondary | ICD-10-CM | POA: Diagnosis not present

## 2015-09-16 LAB — IRON AND TIBC
%SAT: 16 % — AB (ref 21–57)
Iron: 44 ug/dL (ref 41–142)
TIBC: 280 ug/dL (ref 236–444)
UIBC: 237 ug/dL (ref 120–384)

## 2015-09-16 LAB — GLUCOSE, CAPILLARY: GLUCOSE-CAPILLARY: 122 mg/dL — AB (ref 65–99)

## 2015-09-16 LAB — FERRITIN: FERRITIN: 185 ng/mL (ref 9–269)

## 2015-09-17 ENCOUNTER — Ambulatory Visit: Payer: Commercial Managed Care - HMO

## 2015-09-17 VITALS — BP 120/61 | HR 84 | Temp 98.7°F | Resp 18

## 2015-09-17 DIAGNOSIS — C50919 Malignant neoplasm of unspecified site of unspecified female breast: Secondary | ICD-10-CM | POA: Diagnosis not present

## 2015-09-17 DIAGNOSIS — C7951 Secondary malignant neoplasm of bone: Principal | ICD-10-CM

## 2015-09-17 LAB — PREPARE RBC (CROSSMATCH)

## 2015-09-17 MED ORDER — SODIUM CHLORIDE 0.9 % IV SOLN
250.0000 mL | Freq: Once | INTRAVENOUS | Status: AC
  Administered 2015-09-17: 250 mL via INTRAVENOUS

## 2015-09-17 NOTE — Patient Instructions (Signed)
Blood Transfusion   A blood transfusion is a procedure that gives you donated blood through an IV tube. You may need blood because of illness, surgery, or injury. The blood may come from a donor. The blood may also be your own blood that you donated earlier.  The blood you get is made up of different types of cells. You may get:    Red blood cells. These carry oxygen and replace lost blood.    Platelets. These control bleeding.    Plasma. This helps blood to clot.  If you have a clotting disorder, you may also get other types of blood products.   BEFORE THE PROCEDURE   You may have a blood test. This finds out what type of blood you have. It also finds out what kind of blood your body will accept.    If you are going to have a planned surgery, you may donate your own blood. This is done in case you need to have a transfusion.    If you have had an allergic transfusion reaction before, you may be given medicine to help prevent a reaction. Take this medicine only as told by your doctor.   You will have your temperature, blood pressure, and pulse checked.  PROCEDURE    An IV will be started in your hand or arm.    The bag of donated blood will be attached to your IV and run into your vein.    A doctor will regularly check your temperature, blood pressure, and pulse during the procedure. This is done to find any early signs of a transfusion reaction.   If you have any signs or symptoms of a reaction, the procedure may be stopped and you may be given medicine.    When the transfusion is over, your IV will be removed.    Pressure may be applied to the IV site for a few minutes.    A bandage (dressing) will be applied.   The procedure may vary among doctors and hospitals.   AFTER THE PROCEDURE   Your blood pressure, temperature, and pulse will be checked regularly.     This information is not intended to replace advice given to you by your health care provider. Make sure you discuss any questions  you have with your health care provider.     Document Released: 11/09/2008 Document Revised: 09/03/2014 Document Reviewed: 06/23/2014  Elsevier Interactive Patient Education 2016 [employer]

## 2015-09-17 NOTE — Progress Notes (Signed)
Patient received 1 unit and 213 ml of second unit of PRBCs on Saturday (09/17/15).  After 2 successful attempts to start peripheral IV that ended in infiltrations, multiple unsuccessful attempts to re-access patient failed during transfusion of 2nd unit of blood.  Post vital signs stable and patient was discharged home with no signs and symptoms of distress noted.

## 2015-09-19 LAB — TYPE AND SCREEN
ABO/RH(D): A POS
ANTIBODY SCREEN: NEGATIVE
UNIT DIVISION: 0
Unit division: 0

## 2015-09-20 ENCOUNTER — Telehealth: Payer: Self-pay | Admitting: Internal Medicine

## 2015-09-20 NOTE — Telephone Encounter (Signed)
Needs referral to [facility].  Please make pt an appt and please call her with the appt

## 2015-09-21 NOTE — Telephone Encounter (Signed)
Please make sure that when you are placing referral for our patient that you are under "[facility]-Resident" because if not, it will not show up in my WQ. It looks like you were under [facility]. I will correct this. But FYI, [facility] rates their patients from 1-4 based on severity. Patient most likely will not get an appt right away, prob in the next 4-6 months, and their office will contact her. I will work the referral tomorrow, hopefully. Thanks!

## 2015-09-21 NOTE — Telephone Encounter (Signed)
I am so sorry! Did not realize that made a difference! Thanks for the update and for making the referral! -Cat

## 2015-09-21 NOTE — Telephone Encounter (Signed)
Hi Tia,   I believe I put in a referral for nephrology for Anita [name]. Would you be able to get her scheduled? Thanks!   Cat

## 2015-09-23 DIAGNOSIS — I87333 Chronic venous hypertension (idiopathic) with ulcer and inflammation of bilateral lower extremity: Secondary | ICD-10-CM | POA: Diagnosis not present

## 2015-09-23 LAB — GLUCOSE, CAPILLARY: GLUCOSE-CAPILLARY: 208 mg/dL — AB (ref 65–99)

## 2015-09-30 ENCOUNTER — Encounter (HOSPITAL_BASED_OUTPATIENT_CLINIC_OR_DEPARTMENT_OTHER): Payer: Commercial Managed Care - HMO | Attending: Internal Medicine

## 2015-09-30 DIAGNOSIS — I252 Old myocardial infarction: Secondary | ICD-10-CM | POA: Insufficient documentation

## 2015-09-30 DIAGNOSIS — Z923 Personal history of irradiation: Secondary | ICD-10-CM | POA: Insufficient documentation

## 2015-09-30 DIAGNOSIS — I87333 Chronic venous hypertension (idiopathic) with ulcer and inflammation of bilateral lower extremity: Secondary | ICD-10-CM | POA: Diagnosis present

## 2015-09-30 DIAGNOSIS — L97821 Non-pressure chronic ulcer of other part of left lower leg limited to breakdown of skin: Secondary | ICD-10-CM | POA: Diagnosis not present

## 2015-09-30 DIAGNOSIS — E119 Type 2 diabetes mellitus without complications: Secondary | ICD-10-CM | POA: Diagnosis not present

## 2015-09-30 DIAGNOSIS — L97811 Non-pressure chronic ulcer of other part of right lower leg limited to breakdown of skin: Secondary | ICD-10-CM | POA: Insufficient documentation

## 2015-09-30 DIAGNOSIS — J449 Chronic obstructive pulmonary disease, unspecified: Secondary | ICD-10-CM | POA: Insufficient documentation

## 2015-10-03 ENCOUNTER — Telehealth: Payer: Self-pay | Admitting: *Deleted

## 2015-10-03 ENCOUNTER — Ambulatory Visit
Admission: RE | Admit: 2015-10-03 | Discharge: 2015-10-03 | Disposition: A | Discharge status: Home / Self Care | Payer: Medicare HMO | Source: Ambulatory Visit | Attending: Radiation Oncology | Admitting: Radiation Oncology

## 2015-10-03 VITALS — BP 127/67 | HR 93 | Temp 98.8°F | Resp 24 | Ht 62.0 in | Wt 221.6 lb

## 2015-10-03 DIAGNOSIS — C7951 Secondary malignant neoplasm of bone: Principal | ICD-10-CM

## 2015-10-03 DIAGNOSIS — C50919 Malignant neoplasm of unspecified site of unspecified female breast: Secondary | ICD-10-CM

## 2015-10-03 NOTE — Progress Notes (Addendum)
Anita [name] here for follow up.  She denies having pain in her legs.  She reports feeling fatigued and short of breath especially with activity.  She reports she walks at home but needs a wheelchair today.  She is using 3 L of oxygen via nasal canula.  She would like to discuss the results of her PET Scan from 07/04/15.  She is concerned about her right lung and throat.    BP 127/67 mmHg  Pulse 93  Temp(Src) 98.8 F (37.1 C) (Oral)  Resp 24  Ht 5\' 2"  (1.575 m)  Wt 221 lb 9.6 oz (100.517 kg)  BMI 40.52 kg/m2  SpO2 94%

## 2015-10-03 NOTE — Progress Notes (Signed)
Radiation Oncology         [phone] ________________________________  Name: Anita [name] MRN: [id number]  Date: 10/03/2015  DOB: September 09, 1945  Follow-Up Visit Note  CC: Anita Schools, DO  Caryl Never*    ICD-9-CM ICD-10-CM   1. Breast cancer metastasized to bone, unspecified laterality (HCC) 174.9 C50.919    198.5 C79.51     Diagnosis:   Metastatic breast cancer  Interval Since Last Radiation:  One and a half years, patient received treatments to the bilateral femur area for painful osseous metastasis  Narrative:  The patient returns today for routine follow-up.  Patient continues to be followed closely by medical oncology.   Recent imaging shows some mild progression and the patient has been switched to fulvestrant injection and change in Ibrance dosing.    On evaluation today the patient denies any pain in her right arm,  lower back or hip/leg areas. She does ambulate at home. She continues on supplemental oxygen and continues to go to school. She denies any voice changes or pain in the neck or difficulty with swallowing                     ALLERGIES:  is allergic to omnipaque and benzene.  Meds: Current Outpatient Prescriptions  Medication Sig Dispense Refill  . albuterol (PROVENTIL HFA;VENTOLIN HFA) 108 (90 BASE) MCG/ACT inhaler Inhale 2 puffs into the lungs every 6 (six) hours as needed for wheezing or shortness of breath. 3 Inhaler 3  . Ascorbic Acid (VITAMIN C PO) Take 500 mg by mouth daily.     [name] aspirin EC 81 MG EC tablet Take 1 tablet (81 mg total) by mouth daily. 30 tablet 3  . atorvastatin (LIPITOR) 40 MG tablet Take 1 tablet (40 mg total) by mouth daily. 90 tablet 3  . bag balm OINT ointment Apply 1 g topically as needed for dry skin or irritation. 300 g 11  . clotrimazole (LOTRIMIN) 1 % cream Apply 1 application topically daily. 113 g 3  . denosumab (XGEVA) 120 MG/1.7ML SOLN injection Inject 120 mg into the skin once. Monthly at [facility]    . docusate  sodium (COLACE) 100 MG capsule Take 100 mg by mouth daily.    . fulvestrant (FASLODEX) 250 MG/5ML injection Inject 250 mg into the muscle once. One injection each buttock over 1-2 minutes. Warm prior to use.    . isosorbide mononitrate (IMDUR) 60 MG 24 hr tablet Take 1 tablet (60 mg total) by mouth daily. 90 tablet 3  . mirtazapine (REMERON) 15 MG tablet Take 1 tablet (15 mg total) by mouth at bedtime. 30 tablet 5  . NON FORMULARY Place 3 L into the nose as needed (oxygen).     . palbociclib (IBRANCE) 100 MG capsule Take 1 capsule (100 mg total) by mouth daily with breakfast. Take whole with food. 21 capsule 3  . potassium chloride SA (K-DUR,KLOR-CON) 20 MEQ tablet Take 2 tablets (40 mEq total) by mouth 2 (two) times daily. 120 tablet 6  . torsemide (DEMADEX) 20 MG tablet Take 20-80 mg by mouth See admin instructions. Daughter states she takes 80mg  in the morning, then take 40mg  in the afternoon.    [name] VITAMIN D, CHOLECALCIFEROL, PO Take 1,000 mg by mouth daily.     . nitroGLYCERIN (NITROSTAT) 0.4 MG SL tablet Place 1 tablet (0.4 mg total) under the tongue every 5 (five) minutes as needed for chest pain. (Patient not taking: Reported on 09/12/2015) 30 tablet  0   No current facility-administered medications for this encounter.    Physical Findings: The patient is in no acute distress. Patient is alert and oriented.  height is 5\' 2"  (1.575 m) and weight is 221 lb 9.6 oz (100.517 kg). Her oral temperature is 98.8 F (37.1 C). Her blood pressure is 127/67 and her pulse is 93. Her respiration is 24 and oxygen saturation is 94%. .  No palpable lymphadenopathy in the neck or supraclavicular region. Lungs are clear. The heart has a regular rhythm and rate. The abdomen is soft and nontender with normal bowel sounds.  Lower motor strength is 5 out of 5 in the proximal and distal muscle groups  Lab Findings: Lab Results  Component Value Date   WBC 1.7* 09/15/2015   HGB 7.1* 09/15/2015   HCT 22.4*  09/15/2015   MCV 89.6 09/15/2015   PLT 135* 09/15/2015    Radiographic Findings: No results found.  Impression:  Clinically stable with mild progression on recent imaging. The patient is not suffering any pain from her osseous metastasis. Recent PET scan showed no activity in the laryngeal area.  Plan:  When necessary follow-up in radiation oncology. The patient will continue close follow-up and treatment with medical oncology for her metastatic breast cancer.  ____________________________________ Gery Pray, MD

## 2015-10-03 NOTE — Telephone Encounter (Signed)
Received vm call from pt stating that she has a referral from Dr Juleen China to see Kidney Dr-Dr Florene Glen but can't get in for 5-6 monts & would like Dr Burr Medico to call & see if she can be seen sooner.  Message to Dr Burr Medico.

## 2015-10-04 ENCOUNTER — Telehealth: Payer: Self-pay | Admitting: Internal Medicine

## 2015-10-04 NOTE — Telephone Encounter (Signed)
Pt called and would like Dr. Juleen China to call and talk to Dr. Florene Glen to see if she can get in sooner than 5 months. jw

## 2015-10-04 NOTE — Telephone Encounter (Signed)
Myrtle,  Could you call the nephology group to see if any provider can see her within one month?  Thanks,  Truitt Merle

## 2015-10-05 NOTE — Telephone Encounter (Signed)
Called Dr. Abel Presto office & spoke to scheduler & she will discuss with MD & get back with [place] & let us know if she can be seen any earlier.

## 2015-10-05 NOTE — Telephone Encounter (Signed)
Discussed with Tia and would be unable to move her appointment up. This is the standard amount of time patients have to wait for an appointment based on the grading of her kidney disease.   Attempted to contact patient and asked her to return call to the [facility] about this referral.   Phill Myron, D.O. 10/05/2015, 11:57 AM PGY-1, [place]

## 2015-10-06 ENCOUNTER — Telehealth: Payer: Self-pay | Admitting: Internal Medicine

## 2015-10-06 NOTE — Telephone Encounter (Signed)
Wynonia Lawman # [id number]

## 2015-10-06 NOTE — Telephone Encounter (Signed)
Needs [facility] referral.  Please put in acuity portal.  Dr Linton Ham   [phone]    No diagnosis code because this is her first visit.  99499-procedure code

## 2015-10-06 NOTE — Telephone Encounter (Signed)
I will send a message to Dr. Florene Glen also to see if he can see her within one month.  Truitt Merle  10/06/2015

## 2015-10-07 ENCOUNTER — Telehealth: Payer: Self-pay

## 2015-10-07 NOTE — Telephone Encounter (Signed)
Pt requested another call to see kidney specialist earlier.  Routed to pod 1.

## 2015-10-07 NOTE — Telephone Encounter (Signed)
Pt called again about moving up her appt to kidney specialist. Wants to know if dr Juleen China has called

## 2015-10-10 DIAGNOSIS — I87333 Chronic venous hypertension (idiopathic) with ulcer and inflammation of bilateral lower extremity: Secondary | ICD-10-CM | POA: Diagnosis not present

## 2015-10-10 LAB — GLUCOSE, CAPILLARY: Glucose-Capillary: 149 mg/dL — ABNORMAL HIGH (ref 65–99)

## 2015-10-11 ENCOUNTER — Ambulatory Visit (HOSPITAL_BASED_OUTPATIENT_CLINIC_OR_DEPARTMENT_OTHER): Payer: Commercial Managed Care - HMO | Admitting: Hematology

## 2015-10-11 ENCOUNTER — Encounter: Payer: Self-pay | Admitting: Hematology

## 2015-10-11 ENCOUNTER — Ambulatory Visit (HOSPITAL_BASED_OUTPATIENT_CLINIC_OR_DEPARTMENT_OTHER): Payer: Commercial Managed Care - HMO

## 2015-10-11 ENCOUNTER — Telehealth: Payer: Self-pay | Admitting: Hematology

## 2015-10-11 ENCOUNTER — Other Ambulatory Visit (HOSPITAL_BASED_OUTPATIENT_CLINIC_OR_DEPARTMENT_OTHER): Payer: Commercial Managed Care - HMO

## 2015-10-11 VITALS — BP 138/75 | HR 106 | Temp 98.3°F | Resp 23 | Ht 62.0 in | Wt 223.2 lb

## 2015-10-11 DIAGNOSIS — D631 Anemia in chronic kidney disease: Secondary | ICD-10-CM

## 2015-10-11 DIAGNOSIS — C7951 Secondary malignant neoplasm of bone: Principal | ICD-10-CM

## 2015-10-11 DIAGNOSIS — N184 Chronic kidney disease, stage 4 (severe): Secondary | ICD-10-CM | POA: Diagnosis not present

## 2015-10-11 DIAGNOSIS — C7800 Secondary malignant neoplasm of unspecified lung: Secondary | ICD-10-CM

## 2015-10-11 DIAGNOSIS — E1122 Type 2 diabetes mellitus with diabetic chronic kidney disease: Secondary | ICD-10-CM

## 2015-10-11 DIAGNOSIS — I2510 Atherosclerotic heart disease of native coronary artery without angina pectoris: Secondary | ICD-10-CM

## 2015-10-11 DIAGNOSIS — N183 Chronic kidney disease, stage 3 (moderate): Secondary | ICD-10-CM

## 2015-10-11 DIAGNOSIS — C50919 Malignant neoplasm of unspecified site of unspecified female breast: Secondary | ICD-10-CM

## 2015-10-11 DIAGNOSIS — C50911 Malignant neoplasm of unspecified site of right female breast: Secondary | ICD-10-CM | POA: Diagnosis not present

## 2015-10-11 DIAGNOSIS — I2583 Coronary atherosclerosis due to lipid rich plaque: Secondary | ICD-10-CM

## 2015-10-11 DIAGNOSIS — Z5111 Encounter for antineoplastic chemotherapy: Secondary | ICD-10-CM

## 2015-10-11 DIAGNOSIS — I83009 Varicose veins of unspecified lower extremity with ulcer of unspecified site: Secondary | ICD-10-CM

## 2015-10-11 DIAGNOSIS — D638 Anemia in other chronic diseases classified elsewhere: Secondary | ICD-10-CM

## 2015-10-11 DIAGNOSIS — L97909 Non-pressure chronic ulcer of unspecified part of unspecified lower leg with unspecified severity: Secondary | ICD-10-CM

## 2015-10-11 DIAGNOSIS — J410 Simple chronic bronchitis: Secondary | ICD-10-CM

## 2015-10-11 LAB — COMPREHENSIVE METABOLIC PANEL
ALBUMIN: 3.3 g/dL — AB (ref 3.5–5.0)
ALK PHOS: 104 U/L (ref 40–150)
ALT: 31 U/L (ref 0–55)
AST: 39 U/L — ABNORMAL HIGH (ref 5–34)
Anion Gap: 12 mEq/L — ABNORMAL HIGH (ref 3–11)
BUN: 27.6 mg/dL — ABNORMAL HIGH (ref 7.0–26.0)
CALCIUM: 9.4 mg/dL (ref 8.4–10.4)
CO2: 23 mEq/L (ref 22–29)
Chloride: 106 mEq/L (ref 98–109)
Creatinine: 1.7 mg/dL — ABNORMAL HIGH (ref 0.6–1.1)
EGFR: 31 mL/min/{1.73_m2} — AB (ref >90)
GLUCOSE: 222 mg/dL — AB (ref 70–140)
POTASSIUM: 4.1 meq/L (ref 3.5–5.1)
SODIUM: 141 meq/L (ref 136–145)
Total Bilirubin: 0.45 mg/dL (ref 0.20–1.20)
Total Protein: 6.9 g/dL (ref 6.4–8.3)

## 2015-10-11 LAB — CBC & DIFF AND RETIC
BASO%: 0.4 % (ref 0.0–2.0)
Basophils Absolute: 0 10*3/uL (ref 0.0–0.1)
EOS ABS: 0 10*3/uL (ref 0.0–0.5)
EOS%: 1.1 % (ref 0.0–7.0)
HCT: 27.7 % — ABNORMAL LOW (ref 34.8–46.6)
HEMOGLOBIN: 9 g/dL — AB (ref 11.6–15.9)
Immature Retic Fract: 17.4 % — ABNORMAL HIGH (ref 1.60–10.00)
LYMPH%: 11.1 % — AB (ref 14.0–49.7)
MCH: 29.1 pg (ref 25.1–34.0)
MCHC: 32.5 g/dL (ref 31.5–36.0)
MCV: 89.6 fL (ref 79.5–101.0)
MONO#: 0.2 10*3/uL (ref 0.1–0.9)
MONO%: 7.7 % (ref 0.0–14.0)
NEUT%: 79.7 % — ABNORMAL HIGH (ref 38.4–76.8)
NEUTROS ABS: 2.1 10*3/uL (ref 1.5–6.5)
Platelets: 172 10*3/uL (ref 145–400)
RBC: 3.09 10*6/uL — ABNORMAL LOW (ref 3.70–5.45)
RDW: 18.8 % — ABNORMAL HIGH (ref 11.2–14.5)
Retic %: 2.48 % — ABNORMAL HIGH (ref 0.70–2.10)
Retic Ct Abs: 76.63 10*3/uL (ref 33.70–90.70)
WBC: 2.6 10*3/uL — AB (ref 3.9–10.3)
lymph#: 0.3 10*3/uL — ABNORMAL LOW (ref 0.9–3.3)

## 2015-10-11 MED ORDER — FULVESTRANT 250 MG/5ML IM SOLN
500.0000 mg | INTRAMUSCULAR | Status: DC
  Administered 2015-10-11: 500 mg via INTRAMUSCULAR
  Filled 2015-10-11: qty 10

## 2015-10-11 MED ORDER — DENOSUMAB 120 MG/1.7ML ~~LOC~~ SOLN
120.0000 mg | Freq: Once | SUBCUTANEOUS | Status: AC
  Administered 2015-10-11: 120 mg via SUBCUTANEOUS
  Filled 2015-10-11: qty 1.7

## 2015-10-11 NOTE — Progress Notes (Signed)
Taft [place], DO [employer] [place] [address]  CHIEF COMPLAIN: follow up metastatic breast cancer     Breast cancer metastasized to bone ([place])   10/08/2012 Imaging CT chest, abdomen and pelvis showed extensive sclerotic lesions throughout the bones, 4.9 x 3.4 cm soft tissue versus hematoma anterior to the spleen. 6 mm groundglass nodule in the right upper lobe of lung.   10/10/2012 Initial Biopsy Right breast core needle biopsy showed invasive lobular carcinoma, grade 2. Left iliac crest bone biopsy showed metastatic carcinoma, morphology similar to breast biopsy.   10/14/2012 Initial Diagnosis Breast cancer metastasized to bone [facility])   11/2012 - 07/17/2015 Anti-estrogen oral therapy Letrozole '1mg'$  daily, add palbociclib on 01/01/2014, dose modified to 2 weeks on and 1 week off with cycle 3, stopped due to disease progression   02/02/2014 - 02/24/2014 Radiation Therapy Palliative radiation to left and the right femur, 30 gray in 10 fractions, by Dr. Sondra Come    09/01/2014 Imaging CT chest abdomen and pelvis showed several small pulmonary nodules are not changed. Stable widespread bone metastasis.   10/10/2014 Receptors her2 ER 100% positive, PR negative, HER-2 negative.   02/08/2015 Imaging PET scan showed a stable 8 mm right level III lymph node with SUV 8.8, no new neck adenopathy, stable diffuse osseous metastasis, stable for many nodules.   07/04/2015 Progression  PET scan showed overal dasease progression in the bones , slightly increased hypermetabolic activ of the lung nodules , similar appearance of hypermetaabolic rght level III lymph node.   07/18/2015 -  Anti-estrogen oral therapy  fulvestrant 500 mg every 4 weeks ( loading dose every  weeksX2),  and Ibrance '100mg'$  daily,  3 week on, 1 week off     CURRENT THERAPY:   1. She started letrozole in 11/2012.  Added Palbociclib 125 mg daily for 21 days on and 7 days off on 01/01/2014  with disease progression. Dose modified to 2 weeks on 1 week off with cycle 3.  Letrozole changed to fulvestrant on 07/18/2015 due to disease progress, and Ibrance changed to '100mg'$  daily, 3 weeks on and one week off.  2. zometa added on 01/01/2014 for metastatic bone disease. Zometa held due to renal insufficiency and changed to [facility] because of skeletal disease progression and safe renal profile.   INTERVAL HISTORY:  Anita [name] 70 y.o. female with a history of right breast cancer with metastases to the bone/lungs here for follow-up.  She is clinically stable, her fatigue has improved after blood transfusion 2-3 weeks ago, her dyspnea on exertion is stable. She denies any significant pain, nausea or other new symptoms. She has been tolerating fulvestrant and Ibrance well. She is off Ibrance this week.  MEDICAL HISTORY: Past Medical History  Diagnosis Date  . Fibroid   . Morbid obesity ([place])   . 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 COPD (chronic obstructive pulmonary disease) ([place])   . Chronic diastolic CHF (congestive heart failure) ([place])   . NSTEMI (non-ST elevated myocardial infarction) [facility])     Cath November 2014 LAD 40% stenosis, first diagonal 80% stenosis, circumflex 20% stenosis, right coronary artery occluded. The EF was 35-40% at that time.  . Anemia     a. Adm 09/2012 with melena, Hgb 5.8 -> transfused. EGD/colonoscopy unrevealing.  . Breast cancer ([place])     a. Mets to bone. ER 100%/ PR 0%/Her2  neu negative.  [name] Ulcers of both lower legs ([place])   . Tobacco abuse   . Hyperlipidemia 02/11/2013  . Chronic respiratory failure ([place])     a. On O2 qhs. also portable O2.  . Chronic renal insufficiency   . Lesion of vocal cord     a. CT 05/2013 concerning for tumor.  . Shortness of breath   . Depression   . Diabetes mellitus without complication ([place])   . Anginal pain ([place])   . Dementia     very mild  . Radiation 02/02/14-02/24/14    left and  right femur 30 gray    ALLERGIES:  is allergic to omnipaque and benzene.  MEDICATIONS:    Medication List       This list is accurate as of: 10/11/15  7:56 PM.  Always use your most recent med list.               albuterol 108 (90 Base) MCG/ACT inhaler  Commonly known as:  PROVENTIL HFA;VENTOLIN HFA  Inhale 2 puffs into the lungs every 6 (six) hours as needed for wheezing or shortness of breath.     aspirin 81 MG EC tablet  Take 1 tablet (81 mg total) by mouth daily.     atorvastatin 40 MG tablet  Commonly known as:  LIPITOR  Take 1 tablet (40 mg total) by mouth daily.     bag balm Oint ointment  Apply 1 g topically as needed for dry skin or irritation.     clotrimazole 1 % cream  Commonly known as:  LOTRIMIN  Apply 1 application topically daily.     docusate sodium 100 MG capsule  Commonly known as:  COLACE  Take 100 mg by mouth daily.     FASLODEX 250 MG/5ML injection  Generic drug:  fulvestrant  Inject 250 mg into the muscle once. One injection each buttock over 1-2 minutes. Warm prior to use.     isosorbide mononitrate 60 MG 24 hr tablet  Commonly known as:  IMDUR  Take 1 tablet (60 mg total) by mouth daily.     mirtazapine 15 MG tablet  Commonly known as:  REMERON  Take 1 tablet (15 mg total) by mouth at bedtime.     nitroGLYCERIN 0.4 MG SL tablet  Commonly known as:  NITROSTAT  Place 1 tablet (0.4 mg total) under the tongue every 5 (five) minutes as needed for chest pain.     NON FORMULARY  Place 3 L into the nose as needed (oxygen).     palbociclib 100 MG capsule  Commonly known as:  IBRANCE  Take 1 capsule (100 mg total) by mouth daily with breakfast. Take whole with food.     potassium chloride SA 20 MEQ tablet  Commonly known as:  K-DUR,KLOR-CON  Take 2 tablets (40 mEq total) by mouth 2 (two) times daily.     torsemide 20 MG tablet  Commonly known as:  DEMADEX  Take 20-80 mg by mouth See admin instructions. Daughter states she takes '80mg'$  in  the morning, then take '40mg'$  in the afternoon.     VITAMIN C PO  Take 500 mg by mouth daily.     VITAMIN D (CHOLECALCIFEROL) PO  Take 1,000 mg by mouth daily.     XGEVA 120 MG/1.7ML Soln injection  Generic drug:  denosumab  Inject 120 mg into the skin once. Monthly at [employer]:  Past Surgical History  Procedure  Laterality Date  . Colonoscopy, esophagogastroduodenoscopy (egd) and esophageal dilation N/A 10/13/2012    Procedure: colonoscopy and egd;  Surgeon: Wonda Horner, MD;  Location: [place];  Service: Endoscopy;  Laterality: N/A;  . Left heart catheterization with coronary angiogram N/A 07/07/2013    Procedure: LEFT HEART CATHETERIZATION WITH CORONARY ANGIOGRAM;  Surgeon: Peter M Martinique, MD;  Location: [facility] CATH LAB;  Service: Cardiovascular;  Laterality: N/A;   REVIEW OF SYSTEMS:   Constitutional: Denies fevers, chills or abnormal weight loss Eyes: Denies blurriness of vision Ears, nose, mouth, throat, and face: Denies mucositis or sore throat Respiratory: Denies cough, (+) stable dyspnea, no wheezes Cardiovascular: Denies palpitation, chest discomfort or lower extremity swelling Gastrointestinal:  Denies nausea, heartburn or change in bowel habits Skin: Denies abnormal skin rashes Lymphatics: Denies new lymphadenopathy or easy bruising Neurological:Denies numbness, tingling or new weaknesses Behavioral/Psych: Mood is stable, no new changes  All other systems were reviewed with the patient and are negative.  PHYSICAL EXAMINATION: ECOG PERFORMANCE STATUS: 2-3 Blood pressure 138/75, pulse 106, temperature 98.3 F (36.8 C), temperature source Oral, resp. rate 23, height '5\' 2"'$  (1.575 m), weight 223 lb 3.2 oz (101.243 kg), SpO2 97 %. GENERAL:alert, no distress and comfortable morbidly obese woman in wheelchair, chronically ill appearing SKIN: skin color, texture, turgor are normal; posterior legs with irritation and erythema with a small abrasion on the  posterior left thigh with mild TTP.  EYES: normal, Conjunctiva are pink and non-injected, sclera clear OROPHARYNX:no exudate, no erythema and lips, buccal mucosa, and tongue normal  NECK: supple, thyroid normal size, non-tender, without nodularity LYMPH:  no palpable lymphadenopathy in the cervical, axillary LUNGS: coarse breathing with slightly increased breathing rate HEART: regular rate & rhythm and no murmurs and 2+ lower extremity edema BREAST: deferred ABDOMEN:abdomen soft, non-tender and normal bowel sounds; obese Musculoskeletal:no cyanosis of digits and no clubbing; bilateral lower extremities are wrapped with gauze up to knees. NEURO: alert & oriented x 3 with fluent speech, no focal motor/sensory deficits  LABORATORY DATA: CBC Latest Ref Rng 10/11/2015 09/15/2015 09/12/2015  WBC 3.9 - 10.3 10e3/uL 2.6(L) 1.7(L) 2.3(L)  Hemoglobin 11.6 - 15.9 g/dL 9.0(L) 7.1(L) 7.4(L)  Hematocrit 34.8 - 46.6 % 27.7(L) 22.4(L) 22.9(L)  Platelets 145 - 400 10e3/uL 172 135(L) 155    CMP Latest Ref Rng 10/11/2015 09/15/2015 09/12/2015  Glucose 70 - 140 mg/dl 222(H) 133 184(H)  BUN 7.0 - 26.0 mg/dL 27.6(H) 22.9 28.7(H)  Creatinine 0.6 - 1.1 mg/dL 1.7(H) 1.5(H) 2.1(H)  Sodium 136 - 145 mEq/L 141 140 139  Potassium 3.5 - 5.1 mEq/L 4.1 3.3(L) 3.7  CO2 22 - 29 mEq/L '23 26 23  '$ Calcium 8.4 - 10.4 mg/dL 9.4 8.8 9.8  Total Protein 6.4 - 8.3 g/dL 6.9 6.7 6.7  Total Bilirubin 0.20 - 1.20 mg/dL 0.45 0.47 0.37  Alkaline Phos 40 - 150 U/L 104 86 86  AST 5 - 34 U/L 39(H) 43(H) 24  ALT 0 - 55 U/L '31 31 23   '$ ANC 2.1  today   PATHOLOGY REPORT  Diagnosis 10/10/2014 1. Bone, biopsy, left iliac crest - METASTATIC MAMMARY CARCINOMA, SEE COMMENT. 2. Breast, right, needle core biopsy - INVASIVE MAMMARY CARCINOMA, SEE COMMENT. Microscopic Comment 1. and 2. The breast biopsy demonstrates definitive morphologic features of invasive mammary carcinoma. Although the grade of tumor is best assessed at resection, with  these biopsies, the carcinoma is grade II. An E-Cadherin stain is pending and will be reported as an addendum. The morphology of the metastatic  carcinoma in part 2 is identical to the breast biopsy. Breast prognostic studies are pending (part 2) and will be reported as an addendum. The case was reviewed with Dr. Avis Epley who concurs. The case was discussed with Dr. Lamonte Sakai on 10/13/2012.  RADIOGRAPHIC STUDIES:  CT chest abdomen and pelvis 09/01/2014 IMPRESSION: Chest Impression:  1. Stable exam of thorax. 2. Several small smudgy pulmonary nodules are not changed. 3. Stable widespread bony metastasis pneumothorax.  Abdomen / Pelvis Impression:  1. Stable small periaortic retroperitoneal lymph node. 2. No evidence disease progression. 3. Stable widespread skeletal metastasis. 4. Left sided staghorn calculus again demonstrated. 5. Nodular focus in the right kidney at site of prior renal cyst is not changed  PET 07/04/2015 IMPRESSION: 1. Overall there has been an interval progression of disease compared with previous exam. 2. Most notably is the interval increase in FDG uptake associated with multi focal hypermetabolic bone lesions. 3. Similar appearance scratch set similar size and degree of FDG uptake associated with hypermetabolic right level 3 lymph node. There is a pulmonary nodule in the right upper lobe which demonstrates increase in FDG uptake compared with previous exam. Anatomically be pulmonary nodules all appear unchanged in size.   ASSESSMENT: 70 year old Caucasian female  PLAN:  1.  Metastatic breast cancer (ER pos/PR neg/ HER2 neg) with metastasis to bones and lungs, possible right cervical nodes.  -I reviewed her restaging PET scan from 07/04/2015 with pt in [place], which showed slight disease progression in the bones , lung metastasis are stable in size, but the largest lesion has increased FDG uptake.  She also noticed some new right-sided chest wall pain, possibly  related to bone metastasis.  -Given the mild disease progression, I have changed her antiestrogen therapy from letrozole to fulvestrant injection, and continue Ibrance, but changed her Ibrance from 125 mg 2 weeks on, one week off, to 100 mg, 3 weeks on, 1 week off. -she is tolerating treatment well, will continue - lab reviewed with her  -I will repeat her PET in early March   2. bone metastasis - continue Xgeva monthly  -Continue calcium and vitamin D  3. Anemia of chronic disease (CKD and malignancy)  -- Past work up for reversible causes of anemia were negative.  -Repeated ferritin 185, serum iron 44 (normal) and saturation 16% on 09/15/2015  -She is not taking iron pill, could not tolerate.  -I do not recommend ESA, due to her underline malignancy  -anemia improved after blood transfusion on 09/17/2015   4. CHF -clinically stable. She is on Lasix, nitrogylerin prn, losartan. Cards follow up.   5. Diabetes mellitus, type II.  -not well not medication, diet controlled most of time  6. COPD: On albuterol prn. She remains on home oxygen.  7. Chronic renal insufficiency, stage IV   -Stable. -she was referred to Dr. Florene Glen, waiting for appointment   9: Bilateral leg wound -Worsening right lower extremity wound after her fall in August -She still follows up with his wound clinic once a week with dressing change -her right LE wound is healing well    All questions were answered. The patient knows to call the clinic with any problems, questions or concerns. We can certainly see the patient much sooner if necessary.  Plan -She'll start next cycle Ibrance on 2/20  -continue fulvestrant '500mg'$  and Xgeva monthly, injections today                         -  Restaging PET scan in 3 weeks -I'll see her back in 4 weeks with lab, including CBC, CMP     I spent 25 minutes counseling the patient face to face. The total time spent in the appointment was 30 minutes.   Truitt Merle  10/11/2015

## 2015-10-11 NOTE — Telephone Encounter (Signed)
Tried calling patient but phone only rang.  If patient calls back please inform her of message from referral coordinator below regarding her appt. Maleka Contino,CMA

## 2015-10-11 NOTE — Telephone Encounter (Signed)
per pof to sch pt appt-gave pt copy of avs-adv [facility] will call to sch scan °

## 2015-10-12 LAB — CANCER ANTIGEN 27-29 (PARALLEL TESTING): CA 27.29: 79 U/mL — AB (ref <38)

## 2015-10-12 LAB — CANCER ANTIGEN 27.29: CA 27.29: 74.1 U/mL — ABNORMAL HIGH (ref 0.0–38.6)

## 2015-10-21 ENCOUNTER — Telehealth: Payer: Self-pay

## 2015-10-21 ENCOUNTER — Other Ambulatory Visit: Payer: Self-pay | Admitting: *Deleted

## 2015-10-21 MED ORDER — PALBOCICLIB 100 MG PO CAPS: 100.0000 mg | ORAL_CAPSULE | Freq: Every day | ORAL | Status: DC

## 2015-10-21 NOTE — Telephone Encounter (Signed)
Patient called and wanted to let Dr. Burr Medico know that she has an appt with [facility] on March 3rd.

## 2015-10-24 DIAGNOSIS — I87333 Chronic venous hypertension (idiopathic) with ulcer and inflammation of bilateral lower extremity: Secondary | ICD-10-CM | POA: Diagnosis not present

## 2015-10-25 LAB — GLUCOSE, CAPILLARY: Glucose-Capillary: 167 mg/dL — ABNORMAL HIGH (ref 65–99)

## 2015-10-28 ENCOUNTER — Telehealth: Payer: Self-pay | Admitting: Hematology

## 2015-10-28 NOTE — Telephone Encounter (Signed)
returned call and no answer/vm.Marland KitchenMarland KitchenMarland Kitchen

## 2015-10-31 ENCOUNTER — Telehealth: Payer: Self-pay | Admitting: Hematology

## 2015-10-31 ENCOUNTER — Other Ambulatory Visit: Payer: Commercial Managed Care - HMO

## 2015-10-31 ENCOUNTER — Encounter (HOSPITAL_BASED_OUTPATIENT_CLINIC_OR_DEPARTMENT_OTHER): Payer: Commercial Managed Care - HMO | Attending: Internal Medicine

## 2015-10-31 DIAGNOSIS — R609 Edema, unspecified: Secondary | ICD-10-CM | POA: Insufficient documentation

## 2015-10-31 DIAGNOSIS — J449 Chronic obstructive pulmonary disease, unspecified: Secondary | ICD-10-CM | POA: Insufficient documentation

## 2015-10-31 DIAGNOSIS — Z923 Personal history of irradiation: Secondary | ICD-10-CM | POA: Insufficient documentation

## 2015-10-31 DIAGNOSIS — E1136 Type 2 diabetes mellitus with diabetic cataract: Secondary | ICD-10-CM | POA: Diagnosis not present

## 2015-10-31 DIAGNOSIS — L97821 Non-pressure chronic ulcer of other part of left lower leg limited to breakdown of skin: Secondary | ICD-10-CM | POA: Insufficient documentation

## 2015-10-31 DIAGNOSIS — I509 Heart failure, unspecified: Secondary | ICD-10-CM | POA: Diagnosis not present

## 2015-10-31 DIAGNOSIS — L97811 Non-pressure chronic ulcer of other part of right lower leg limited to breakdown of skin: Secondary | ICD-10-CM | POA: Insufficient documentation

## 2015-10-31 DIAGNOSIS — I87333 Chronic venous hypertension (idiopathic) with ulcer and inflammation of bilateral lower extremity: Secondary | ICD-10-CM | POA: Diagnosis not present

## 2015-10-31 DIAGNOSIS — I252 Old myocardial infarction: Secondary | ICD-10-CM | POA: Diagnosis not present

## 2015-10-31 LAB — GLUCOSE, CAPILLARY: GLUCOSE-CAPILLARY: 122 mg/dL — AB (ref 65–99)

## 2015-10-31 NOTE — Telephone Encounter (Signed)
pt called to r/s appt....pt ok and aware

## 2015-11-02 ENCOUNTER — Telehealth: Payer: Self-pay | Admitting: Internal Medicine

## 2015-11-02 NOTE — Telephone Encounter (Signed)
Pt brought in forms from [employer]. Forms need to be filled out by dr stating she has COPD and cannot be without her oxygen. Therefore her power cannot be cut off. Please fax the forms and mail the completed copies to the pt

## 2015-11-02 NOTE — Telephone Encounter (Signed)
Form placed in PCP box for completion. Zimmerman Rumple, April D, CMA  

## 2015-11-03 ENCOUNTER — Other Ambulatory Visit (HOSPITAL_BASED_OUTPATIENT_CLINIC_OR_DEPARTMENT_OTHER): Payer: Commercial Managed Care - HMO

## 2015-11-03 DIAGNOSIS — C50911 Malignant neoplasm of unspecified site of right female breast: Secondary | ICD-10-CM | POA: Diagnosis not present

## 2015-11-03 DIAGNOSIS — C50919 Malignant neoplasm of unspecified site of unspecified female breast: Secondary | ICD-10-CM

## 2015-11-03 DIAGNOSIS — D638 Anemia in other chronic diseases classified elsewhere: Secondary | ICD-10-CM

## 2015-11-03 DIAGNOSIS — C7951 Secondary malignant neoplasm of bone: Principal | ICD-10-CM

## 2015-11-03 LAB — CBC & DIFF AND RETIC
BASO%: 0.4 % (ref 0.0–2.0)
BASOS ABS: 0 10*3/uL (ref 0.0–0.1)
EOS ABS: 0.1 10*3/uL (ref 0.0–0.5)
EOS%: 3.5 % (ref 0.0–7.0)
HEMATOCRIT: 27 % — AB (ref 34.8–46.6)
HEMOGLOBIN: 8.7 g/dL — AB (ref 11.6–15.9)
Immature Retic Fract: 15.5 % — ABNORMAL HIGH (ref 1.60–10.00)
LYMPH%: 15.5 % (ref 14.0–49.7)
MCH: 28.5 pg (ref 25.1–34.0)
MCHC: 32.2 g/dL (ref 31.5–36.0)
MCV: 88.5 fL (ref 79.5–101.0)
MONO#: 0.1 10*3/uL (ref 0.1–0.9)
MONO%: 6.2 % (ref 0.0–14.0)
NEUT%: 74.4 % (ref 38.4–76.8)
NEUTROS ABS: 1.7 10*3/uL (ref 1.5–6.5)
Platelets: 142 10*3/uL — ABNORMAL LOW (ref 145–400)
RBC: 3.05 10*6/uL — ABNORMAL LOW (ref 3.70–5.45)
RDW: 17.9 % — ABNORMAL HIGH (ref 11.2–14.5)
Retic %: 1.88 % (ref 0.70–2.10)
Retic Ct Abs: 57.34 10*3/uL (ref 33.70–90.70)
WBC: 2.3 10*3/uL — AB (ref 3.9–10.3)
lymph#: 0.4 10*3/uL — ABNORMAL LOW (ref 0.9–3.3)

## 2015-11-03 LAB — COMPREHENSIVE METABOLIC PANEL
ALBUMIN: 3.2 g/dL — AB (ref 3.5–5.0)
ALK PHOS: 115 U/L (ref 40–150)
ALT: 26 U/L (ref 0–55)
AST: 35 U/L — AB (ref 5–34)
Anion Gap: 9 mEq/L (ref 3–11)
BILIRUBIN TOTAL: 0.3 mg/dL (ref 0.20–1.20)
BUN: 32 mg/dL — AB (ref 7.0–26.0)
CALCIUM: 9.1 mg/dL (ref 8.4–10.4)
CO2: 27 mEq/L (ref 22–29)
CREATININE: 1.6 mg/dL — AB (ref 0.6–1.1)
Chloride: 102 mEq/L (ref 98–109)
EGFR: 32 mL/min/{1.73_m2} — ABNORMAL LOW (ref >90)
GLUCOSE: 166 mg/dL — AB (ref 70–140)
POTASSIUM: 3.9 meq/L (ref 3.5–5.1)
SODIUM: 138 meq/L (ref 136–145)
TOTAL PROTEIN: 6.7 g/dL (ref 6.4–8.3)

## 2015-11-04 ENCOUNTER — Ambulatory Visit (HOSPITAL_COMMUNITY)
Admission: RE | Admit: 2015-11-04 | Discharge: 2015-11-04 | Disposition: A | Discharge status: Home / Self Care | Payer: Commercial Managed Care - HMO | Source: Ambulatory Visit | Attending: Hematology | Admitting: Hematology

## 2015-11-04 DIAGNOSIS — K760 Fatty (change of) liver, not elsewhere classified: Secondary | ICD-10-CM | POA: Diagnosis not present

## 2015-11-04 DIAGNOSIS — R911 Solitary pulmonary nodule: Secondary | ICD-10-CM | POA: Insufficient documentation

## 2015-11-04 DIAGNOSIS — R160 Hepatomegaly, not elsewhere classified: Secondary | ICD-10-CM | POA: Insufficient documentation

## 2015-11-04 DIAGNOSIS — C7951 Secondary malignant neoplasm of bone: Secondary | ICD-10-CM | POA: Diagnosis present

## 2015-11-04 DIAGNOSIS — C50919 Malignant neoplasm of unspecified site of unspecified female breast: Secondary | ICD-10-CM

## 2015-11-04 DIAGNOSIS — I288 Other diseases of pulmonary vessels: Secondary | ICD-10-CM | POA: Insufficient documentation

## 2015-11-04 DIAGNOSIS — I2510 Atherosclerotic heart disease of native coronary artery without angina pectoris: Secondary | ICD-10-CM | POA: Insufficient documentation

## 2015-11-04 LAB — CANCER ANTIGEN 27.29: CAN 27.29: 71.9 U/mL — AB (ref 0.0–38.6)

## 2015-11-04 LAB — GLUCOSE, CAPILLARY: GLUCOSE-CAPILLARY: 178 mg/dL — AB (ref 65–99)

## 2015-11-04 MED ORDER — FLUDEOXYGLUCOSE F - 18 (FDG) INJECTION
11.0000 | Freq: Once | INTRAVENOUS | Status: AC | PRN
  Administered 2015-11-04: 11 via INTRAVENOUS

## 2015-11-04 NOTE — Telephone Encounter (Signed)
[employer] form completed and placed in Tamika's office.

## 2015-11-07 ENCOUNTER — Encounter: Payer: Self-pay | Admitting: Hematology

## 2015-11-07 ENCOUNTER — Telehealth: Payer: Self-pay | Admitting: Hematology

## 2015-11-07 ENCOUNTER — Ambulatory Visit: Payer: Commercial Managed Care - HMO

## 2015-11-07 ENCOUNTER — Encounter: Payer: Commercial Managed Care - HMO | Admitting: Hematology

## 2015-11-07 ENCOUNTER — Other Ambulatory Visit: Payer: Self-pay | Admitting: Hematology

## 2015-11-07 ENCOUNTER — Other Ambulatory Visit: Payer: Self-pay | Admitting: Nephrology

## 2015-11-07 DIAGNOSIS — N183 Chronic kidney disease, stage 3 unspecified: Secondary | ICD-10-CM

## 2015-11-07 NOTE — Telephone Encounter (Signed)
Tried calling to inform patient that forms were faxed to [employer] and original forms placed in mail to home address.  Derl Barrow, RN

## 2015-11-07 NOTE — Telephone Encounter (Signed)
pt called to r/s/////done...pt ok and aware of new d.t

## 2015-11-07 NOTE — Telephone Encounter (Signed)
Per 3/13 pof moved 3/17 f/u from LT to YF and added lab. Spoke with patient she is aware of new time for 3/17 @ 2:15 pm.

## 2015-11-07 NOTE — Progress Notes (Signed)
This encounter was created in error - please disregard.

## 2015-11-08 ENCOUNTER — Other Ambulatory Visit: Payer: Self-pay | Admitting: Internal Medicine

## 2015-11-08 NOTE — Telephone Encounter (Signed)
LVM on mobile number for pt to call back to inform her of below. Katharina Caper, Finnis Colee D, [place]

## 2015-11-10 DIAGNOSIS — I87333 Chronic venous hypertension (idiopathic) with ulcer and inflammation of bilateral lower extremity: Secondary | ICD-10-CM | POA: Diagnosis not present

## 2015-11-11 ENCOUNTER — Telehealth: Payer: Self-pay | Admitting: Hematology

## 2015-11-11 ENCOUNTER — Ambulatory Visit (HOSPITAL_BASED_OUTPATIENT_CLINIC_OR_DEPARTMENT_OTHER): Payer: Commercial Managed Care - HMO | Admitting: Hematology

## 2015-11-11 ENCOUNTER — Encounter: Payer: Self-pay | Admitting: Hematology

## 2015-11-11 ENCOUNTER — Other Ambulatory Visit: Payer: Commercial Managed Care - HMO

## 2015-11-11 ENCOUNTER — Ambulatory Visit (HOSPITAL_BASED_OUTPATIENT_CLINIC_OR_DEPARTMENT_OTHER): Payer: Commercial Managed Care - HMO

## 2015-11-11 VITALS — BP 118/50 | HR 100 | Temp 97.6°F | Resp 18 | Ht 62.0 in | Wt 221.8 lb

## 2015-11-11 DIAGNOSIS — C7951 Secondary malignant neoplasm of bone: Secondary | ICD-10-CM

## 2015-11-11 DIAGNOSIS — C50919 Malignant neoplasm of unspecified site of unspecified female breast: Secondary | ICD-10-CM

## 2015-11-11 DIAGNOSIS — D72819 Decreased white blood cell count, unspecified: Secondary | ICD-10-CM

## 2015-11-11 DIAGNOSIS — C50911 Malignant neoplasm of unspecified site of right female breast: Secondary | ICD-10-CM

## 2015-11-11 DIAGNOSIS — C7800 Secondary malignant neoplasm of unspecified lung: Secondary | ICD-10-CM

## 2015-11-11 DIAGNOSIS — L97909 Non-pressure chronic ulcer of unspecified part of unspecified lower leg with unspecified severity: Secondary | ICD-10-CM

## 2015-11-11 DIAGNOSIS — I2510 Atherosclerotic heart disease of native coronary artery without angina pectoris: Secondary | ICD-10-CM

## 2015-11-11 DIAGNOSIS — Z5111 Encounter for antineoplastic chemotherapy: Secondary | ICD-10-CM

## 2015-11-11 DIAGNOSIS — D638 Anemia in other chronic diseases classified elsewhere: Secondary | ICD-10-CM

## 2015-11-11 DIAGNOSIS — N184 Chronic kidney disease, stage 4 (severe): Secondary | ICD-10-CM

## 2015-11-11 DIAGNOSIS — N183 Chronic kidney disease, stage 3 (moderate): Secondary | ICD-10-CM

## 2015-11-11 DIAGNOSIS — I2583 Coronary atherosclerosis due to lipid rich plaque: Secondary | ICD-10-CM

## 2015-11-11 DIAGNOSIS — D696 Thrombocytopenia, unspecified: Secondary | ICD-10-CM

## 2015-11-11 DIAGNOSIS — I83009 Varicose veins of unspecified lower extremity with ulcer of unspecified site: Secondary | ICD-10-CM

## 2015-11-11 DIAGNOSIS — E1122 Type 2 diabetes mellitus with diabetic chronic kidney disease: Secondary | ICD-10-CM

## 2015-11-11 DIAGNOSIS — J410 Simple chronic bronchitis: Secondary | ICD-10-CM

## 2015-11-11 LAB — GLUCOSE, CAPILLARY: Glucose-Capillary: 227 mg/dL — ABNORMAL HIGH (ref 65–99)

## 2015-11-11 MED ORDER — FULVESTRANT 250 MG/5ML IM SOLN
500.0000 mg | INTRAMUSCULAR | Status: DC
  Administered 2015-11-11: 500 mg via INTRAMUSCULAR
  Filled 2015-11-11: qty 10

## 2015-11-11 MED ORDER — DENOSUMAB 120 MG/1.7ML ~~LOC~~ SOLN
120.0000 mg | Freq: Once | SUBCUTANEOUS | Status: AC
  Administered 2015-11-11: 120 mg via SUBCUTANEOUS
  Filled 2015-11-11: qty 1.7

## 2015-11-11 NOTE — Telephone Encounter (Signed)
Gave patient avs report and appointments for April. Per patient request appointments scheduled for Friday 4/21.

## 2015-11-11 NOTE — Progress Notes (Signed)
[name], DO [place] [address]  CHIEF COMPLAIN: follow up metastatic breast cancer     Breast cancer metastasized to bone ([place])   10/08/2012 Imaging CT chest, abdomen and pelvis showed extensive sclerotic lesions throughout the bones, 4.9 x 3.4 cm soft tissue versus hematoma anterior to the spleen. 6 mm groundglass nodule in the right upper lobe of lung.   10/10/2012 Initial Biopsy Right breast core needle biopsy showed invasive lobular carcinoma, grade 2. Left iliac crest bone biopsy showed metastatic carcinoma, morphology similar to breast biopsy.   10/14/2012 Initial Diagnosis Breast cancer metastasized to bone [facility])   11/2012 - 07/17/2015 Anti-estrogen oral therapy Letrozole '1mg'$  daily, add palbociclib on 01/01/2014, dose modified to 2 weeks on and 1 week off with cycle 3, stopped due to disease progression   02/02/2014 - 02/24/2014 Radiation Therapy Palliative radiation to left and the right femur, 30 gray in 10 fractions, by Dr. Sondra Come    09/01/2014 Imaging CT chest abdomen and pelvis showed several small pulmonary nodules are not changed. Stable widespread bone metastasis.   10/10/2014 Receptors her2 ER 100% positive, PR negative, HER-2 negative.   02/08/2015 Imaging PET scan showed a stable 8 mm right level III lymph node with SUV 8.8, no new neck adenopathy, stable diffuse osseous metastasis, stable for many nodules.   07/04/2015 Progression  PET scan showed overal dasease progression in the bones , slightly increased hypermetabolic activ of the lung nodules , similar appearance of hypermetaabolic rght level III lymph node.   07/18/2015 -  Anti-estrogen oral therapy  fulvestrant 500 mg every 4 weeks ( loading dose every  weeksX2),  and Ibrance '100mg'$  daily,  3 week on, 1 week off   11/04/2015 Imaging PET scan showed interval response to therapy, especially bone metastasis. Right level III cervical node and right upper  lobe pulmonary nodule also demonstrate decreased hypermetabolism.     CURRENT THERAPY:   1. She started letrozole in 11/2012.  Added Palbociclib 125 mg daily for 21 days on and 7 days off on 01/01/2014 with disease progression. Dose modified to 2 weeks on 1 week off with cycle 3.  Letrozole changed to fulvestrant on 07/18/2015 due to disease progress, and Ibrance changed to '100mg'$  daily, 3 weeks on and one week off.  2. zometa added on 01/01/2014 for metastatic bone disease. Zometa held due to renal insufficiency and changed to [facility] because of skeletal disease progression and safe renal profile.   INTERVAL HISTORY:  Anita [name] 70 y.o. female with a history of right breast cancer with metastases to the bone/lungs here for follow-up.  She noticed slightly  Worse dyspnea lately, no significant cough, but she does notice frequent flame and she needs to clear up her throat.  No chest pain, or other new symptoms. Her left lack wound is slowly healing, getting smaller now. No fever or chills.    MEDICAL HISTORY: Past Medical History  Diagnosis Date  . Fibroid   . Morbid obesity ([place])   . 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 COPD (chronic obstructive pulmonary disease) ([place])   . Chronic diastolic CHF (congestive heart failure) ([place])   . NSTEMI (non-ST elevated myocardial infarction) [facility])     Cath November 2014 LAD 40% stenosis, first diagonal 80% stenosis, circumflex 20% stenosis, right coronary artery occluded. The EF was 35-40% at that time.  [name]  Anemia     a. Adm 09/2012 with melena, Hgb 5.8 -> transfused. EGD/colonoscopy unrevealing.  . Breast cancer ([place])     a. Mets to bone. ER 100%/ PR 0%/Her2 neu negative.  [name] Ulcers of both lower legs ([place])   . Tobacco abuse   . Hyperlipidemia 02/11/2013  . Chronic respiratory failure ([place])     a. On O2 qhs. also portable O2.  . Chronic renal insufficiency   . Lesion of vocal cord     a. CT 05/2013 concerning  for tumor.  . Shortness of breath   . Depression   . Diabetes mellitus without complication ([place])   . Anginal pain ([place])   . Dementia     very mild  . Radiation 02/02/14-02/24/14    left and right femur 30 gray    ALLERGIES:  is allergic to omnipaque and benzene.  MEDICATIONS:    Medication List       This list is accurate as of: 11/11/15  6:05 PM.  Always use your most recent med list.               albuterol 108 (90 Base) MCG/ACT inhaler  Commonly known as:  PROVENTIL HFA;VENTOLIN HFA  Inhale 2 puffs into the lungs every 6 (six) hours as needed for wheezing or shortness of breath.     aspirin 81 MG EC tablet  Take 1 tablet (81 mg total) by mouth daily.     atorvastatin 40 MG tablet  Commonly known as:  LIPITOR  Take 1 tablet (40 mg total) by mouth daily.     bag balm Oint ointment  Apply 1 g topically as needed for dry skin or irritation.     clotrimazole 1 % cream  Commonly known as:  LOTRIMIN  Apply 1 application topically daily.     docusate sodium 100 MG capsule  Commonly known as:  COLACE  Take 100 mg by mouth daily.     FASLODEX 250 MG/5ML injection  Generic drug:  fulvestrant  Inject 250 mg into the muscle once. One injection each buttock over 1-2 minutes. Warm prior to use.     isosorbide mononitrate 60 MG 24 hr tablet  Commonly known as:  IMDUR  Take 1 tablet (60 mg total) by mouth daily.     mirtazapine 15 MG tablet  Commonly known as:  REMERON  Take 1 tablet (15 mg total) by mouth at bedtime.     nitroGLYCERIN 0.4 MG SL tablet  Commonly known as:  NITROSTAT  Place 1 tablet (0.4 mg total) under the tongue every 5 (five) minutes as needed for chest pain.     NON FORMULARY  Place 3 L into the nose as needed (oxygen).     palbociclib 100 MG capsule  Commonly known as:  IBRANCE  Take 1 capsule (100 mg total) by mouth daily with breakfast. Take whole with food.     potassium chloride SA 20 MEQ tablet  Commonly known as:  K-DUR,KLOR-CON  TAKE 2  TABLETS TWICE DAILY     torsemide 20 MG tablet  Commonly known as:  DEMADEX  Take 20-80 mg by mouth See admin instructions. Daughter states she takes '80mg'$  in the morning, then take '40mg'$  in the afternoon.     VITAMIN C PO  Take 500 mg by mouth daily.     VITAMIN D (CHOLECALCIFEROL) PO  Take 1,000 mg by mouth daily.     XGEVA 120 MG/1.7ML Soln injection  Generic drug:  denosumab  Inject 120 mg into the skin once. [facility] at [facility]        SURGICAL HISTORY:  Past Surgical History  Procedure Laterality Date  . Colonoscopy, esophagogastroduodenoscopy (egd) and esophageal dilation N/A 10/13/2012    Procedure: colonoscopy and egd;  Surgeon: Wonda Horner, MD;  Location: [facility];  Service: Endoscopy;  Laterality: N/A;  . Left heart catheterization with coronary angiogram N/A 07/07/2013    Procedure: LEFT HEART CATHETERIZATION WITH CORONARY ANGIOGRAM;  Surgeon: Peter M Martinique, MD;  Location: [facility] CATH LAB;  Service: Cardiovascular;  Laterality: N/A;   REVIEW OF SYSTEMS:   Constitutional: Denies fevers, chills or abnormal weight loss Eyes: Denies blurriness of vision Ears, nose, mouth, throat, and face: Denies mucositis or sore throat Respiratory: Denies cough, (+) stable dyspnea, no wheezes Cardiovascular: Denies palpitation, chest discomfort or lower extremity swelling Gastrointestinal:  Denies nausea, heartburn or change in bowel habits Skin: Denies abnormal skin rashes Lymphatics: Denies new lymphadenopathy or easy bruising Neurological:Denies numbness, tingling or new weaknesses Behavioral/Psych: Mood is stable, no new changes  All other systems were reviewed with the patient and are negative.  PHYSICAL EXAMINATION: ECOG PERFORMANCE STATUS: 3 Blood pressure 118/50, pulse 100, temperature 97.6 F (36.4 C), temperature source Oral, resp. rate 18, height '5\' 2"'$  (1.575 m), weight 221 lb 12.8 oz (100.608 kg), SpO2 94 %. GENERAL:alert, no distress and comfortable morbidly obese woman  in wheelchair, chronically ill appearing SKIN: skin color, texture, turgor are normal; posterior legs with irritation and erythema with a small abrasion on the posterior left thigh with mild TTP.  EYES: normal, Conjunctiva are pink and non-injected, sclera clear OROPHARYNX:no exudate, no erythema and lips, buccal mucosa, and tongue normal  NECK: supple, thyroid normal size, non-tender, without nodularity LYMPH:  no palpable lymphadenopathy in the cervical, axillary LUNGS: coarse breathing with slightly increased breathing rate, (+) crackers on bilateral bases HEART: regular rate & rhythm and no murmurs and 2+ lower extremity edema BREAST: deferred ABDOMEN:abdomen soft, non-tender and normal bowel sounds; obese Musculoskeletal:no cyanosis of digits and no clubbing; bilateral lower extremities are wrapped with gauze up to knees. NEURO: alert & oriented x 3 with fluent speech, no focal motor/sensory deficits  LABORATORY DATA: CBC Latest Ref Rng 11/03/2015 10/11/2015 09/15/2015  WBC 3.9 - 10.3 10e3/uL 2.3(L) 2.6(L) 1.7(L)  Hemoglobin 11.6 - 15.9 g/dL 8.7(L) 9.0(L) 7.1(L)  Hematocrit 34.8 - 46.6 % 27.0(L) 27.7(L) 22.4(L)  Platelets 145 - 400 10e3/uL 142(L) 172 135(L)    CMP Latest Ref Rng 11/03/2015 10/11/2015 09/15/2015  Glucose 70 - 140 mg/dl 166(H) 222(H) 133  BUN 7.0 - 26.0 mg/dL 32.0(H) 27.6(H) 22.9  Creatinine 0.6 - 1.1 mg/dL 1.6(H) 1.7(H) 1.5(H)  Sodium 136 - 145 mEq/L 138 141 140  Potassium 3.5 - 5.1 mEq/L 3.9 4.1 3.3(L)  CO2 22 - 29 mEq/L '27 23 26  '$ Calcium 8.4 - 10.4 mg/dL 9.1 9.4 8.8  Total Protein 6.4 - 8.3 g/dL 6.7 6.9 6.7  Total Bilirubin 0.20 - 1.20 mg/dL 0.30 0.45 0.47  Alkaline Phos 40 - 150 U/L 115 104 86  AST 5 - 34 U/L 35(H) 39(H) 43(H)  ALT 0 - 55 U/L '26 31 31   '$ CA 27.29    PATHOLOGY REPORT  Diagnosis 10/10/2014 1. Bone, biopsy, left iliac crest - METASTATIC MAMMARY CARCINOMA, SEE COMMENT. 2. Breast, right, needle core biopsy - INVASIVE MAMMARY CARCINOMA, SEE  COMMENT. Microscopic Comment 1. and 2. The breast biopsy demonstrates definitive morphologic features of invasive mammary carcinoma. Although the grade of tumor is  best assessed at resection, with these biopsies, the carcinoma is grade II. An E-Cadherin stain is pending and will be reported as an addendum. The morphology of the metastatic carcinoma in part 2 is identical to the breast biopsy. Breast prognostic studies are pending (part 2) and will be reported as an addendum. The case was reviewed with Dr. Avis Epley who concurs. The case was discussed with Dr. Lamonte Sakai on 10/13/2012.  RADIOGRAPHIC STUDIES:  CT chest abdomen and pelvis 09/01/2014 IMPRESSION: Chest Impression:  1. Stable exam of thorax. 2. Several small smudgy pulmonary nodules are not changed. 3. Stable widespread bony metastasis pneumothorax.  Abdomen / Pelvis Impression:  1. Stable small periaortic retroperitoneal lymph node. 2. No evidence disease progression. 3. Stable widespread skeletal metastasis. 4. Left sided staghorn calculus again demonstrated. 5. Nodular focus in the right kidney at site of prior renal cyst is not changed  PET 11/04/2015 IMPRESSION: 1. Primarily response to therapy. 2. Right level 3 node is similar in size and slightly decreased in hypermetabolism. Index right upper lobe pulmonary nodule is enlarged minimally but demonstrates decreased hypermetabolism. 3. Improvement in widespread osseous metastasis. No new sites of disease identified. 4. Atherosclerosis, including within the coronary arteries. 5. Pulmonary artery enlargement suggests pulmonary arterial hypertension. 6. Hepatic steatosis and hepatomegaly. 7. Chronic stone filled left renal pelvis.    ASSESSMENT: 70 year old Caucasian female  PLAN:  1.  Metastatic breast cancer (ER pos/PR neg/ HER2 neg) with metastasis to bones and lungs, possible right cervical nodes.  -I reviewed her restaging PET scan from 07/04/2015 with pt in  [place], which showed slight disease progression in the bones , lung metastasis are stable in size, but the largest lesion has increased FDG uptake.  She also noticed some new right-sided chest wall pain, possibly related to bone metastasis.  -Given the mild disease progression, I have changed her antiestrogen therapy from letrozole to fulvestrant injection, and continue Ibrance, but changed her Ibrance from 125 mg 2 weeks on, one week off, to 100 mg, 3 weeks on, 1 week off. -restaging PET scan from 11/04/2015 showed good response to bone metastasis, and a stable pulmonary metastasis. No other new lesions. -she is tolerating treatment well, will continue - lab reviewed with her, slightly worse anemia, mild leukopenia from Ibrance, ANC 1.7, adequate for treatment.  2. bone metastasis - continue Xgeva monthly  -Continue calcium and vitamin D  3. Anemia of chronic disease (CKD and malignancy), and anemia second to Ibrance -- Past work up for reversible causes of anemia were negative.  -Repeated ferritin 185, serum iron 44 (normal) and saturation 16% on 09/15/2015  -She is not taking iron pill, could not tolerate.  -I do not recommend ESA, due to her underline malignancy  -anemia improved after blood transfusion on 09/17/2015   4. CHF -she has slightly worsening dyspnea, bilateral lung rales on exam, probable pulmonary edema. -I encouraged her to call her cardiologist for follow-up.  5. Diabetes mellitus, type II.  -not well not medication, diet controlled most of time  6. COPD: On albuterol prn. She remains on home oxygen.  7. Chronic renal insufficiency, stage IV   -Stable. -she will follow-up with Dr. Florene Glen  9: Bilateral leg venous stasis ulcers -Worsening right lower extremity wound after her fall in August -She still follows up with his wound clinic once a week with dressing change -her right LE wound is healing well    10. Mild leukopenia and thrombocytopenia -Secondary to  [facility] continue monitoring.  All questions  were answered. The patient knows to call the clinic with any problems, questions or concerns. We can certainly see the patient much sooner if necessary.  Plan -She'll start next cycle Ibrance on 3/20 -continue fulvestrant '500mg'$  and Xgeva monthly, injections today                         -I'll see her back in 4 weeks with lab, including CBC, CMP  -I encouraged her to follow-up with her cardiologist for pulmonary edema    I spent 25 minutes counseling the patient face to face. The total time spent in the appointment was 30 minutes.   Anita [name]  11/11/2015

## 2015-11-14 ENCOUNTER — Other Ambulatory Visit: Payer: Commercial Managed Care - HMO

## 2015-11-15 ENCOUNTER — Telehealth: Payer: Self-pay | Admitting: Internal Medicine

## 2015-11-15 NOTE — Telephone Encounter (Signed)
Pt called and needs a letter stating what her medical conditions are for school. She goes to [employer] and they want to know what accomodations she might need. Pt would like you to put COPD, chronic Bronchitis for sure but at the same time she doesn't want everything revealed. Please call patient once letter is written so that the doctor and the patient could go over this together. jw

## 2015-11-16 NOTE — Telephone Encounter (Signed)
Attempted to call patient to let her know that I have worked on her letter and want to discuss before it is finalized.   Will you please call patient and let her know that I am ready to discuss?   Thanks  Phill Myron, D.O. 11/16/2015, 9:33 AM PGY-1, [place]

## 2015-11-17 DIAGNOSIS — I87333 Chronic venous hypertension (idiopathic) with ulcer and inflammation of bilateral lower extremity: Secondary | ICD-10-CM | POA: Diagnosis not present

## 2015-11-17 NOTE — Telephone Encounter (Signed)
Called patient and left VM. Will attempt to call again later today.   Phill Myron, D.O. 11/17/2015, 12:10 PM PGY-1, [place]

## 2015-11-17 NOTE — Telephone Encounter (Signed)
Spoke to pt. She needs the letter by Tuesday. Wanted to know if you can call her today around 12:00 pm. Phone number is [phone]. Ottis Stain, CMA

## 2015-11-18 ENCOUNTER — Ambulatory Visit
Admission: RE | Admit: 2015-11-18 | Discharge: 2015-11-18 | Disposition: A | Discharge status: Home / Self Care | Payer: Commercial Managed Care - HMO | Source: Ambulatory Visit | Attending: Nephrology | Admitting: Nephrology

## 2015-11-18 DIAGNOSIS — N183 Chronic kidney disease, stage 3 unspecified: Secondary | ICD-10-CM

## 2015-11-18 NOTE — Telephone Encounter (Signed)
Pt called again. She says Dr Juleen China can call her back anytime today or tonight.  She will be at home

## 2015-11-19 NOTE — Telephone Encounter (Signed)
Attempted to call patient again to discuss letter for school. No answer. Will attempt later today if able. If not, will try again Monday 3/27.   Phill Myron, D.O. 11/19/2015, 1:38 PM PGY-1, [place]

## 2015-11-21 ENCOUNTER — Telehealth: Payer: Self-pay | Admitting: Internal Medicine

## 2015-11-21 NOTE — Telephone Encounter (Signed)
Pt called, spoke to Dr. Juleen China. Ottis Stain, CMA

## 2015-11-21 NOTE — Telephone Encounter (Signed)
Called patient and left VM regarding letter. Asked for her to return call while I am in clinic this afternoon. If no return call will have letter ready for her to pick up tomorrow. I will be working nights starting tomorrow and unavailable to return call tomorrow 3/28.   Phill Myron, D.O. 11/21/2015, 11:42 AM PGY-1, [place]

## 2015-11-21 NOTE — Telephone Encounter (Signed)
Pt called and would like to speak to her doctor about a matter the doctor only knows about, jw

## 2015-11-21 NOTE — Telephone Encounter (Signed)
Able to speak with patient about letter on the phone. Have made edits and will route to front desk for it to be faxed. Fax number is [phone].   Phill Myron, D.O. 11/21/2015, 4:34 PM PGY-1, [place]

## 2015-11-21 NOTE — Telephone Encounter (Signed)
Have not heard back from patient. Will print letter and leave at the front desk.   Phill Myron, D.O. 11/21/2015, 3:53 PM PGY-1, [place]

## 2015-11-22 NOTE — Telephone Encounter (Signed)
Pt spoke to Dr Juleen China. Ottis Stain, CMA

## 2015-11-25 ENCOUNTER — Telehealth: Payer: Self-pay | Admitting: Family Medicine

## 2015-11-25 DIAGNOSIS — I87333 Chronic venous hypertension (idiopathic) with ulcer and inflammation of bilateral lower extremity: Secondary | ICD-10-CM | POA: Diagnosis not present

## 2015-11-25 NOTE — Telephone Encounter (Signed)
AFTER HOURS LINE  Patient initially difficult to understand. She's calling because her leg was R "bright red today."  She had some sort of wrap on her leg (I could not understand after repeated attempts of clarification) and they took this off and placed an unna boot on at wound care today. She states the [place] wound care doctor couldn't exactly tell her what's wrong but suggested she got see a vascular doctor. She saw the doctor around 3pm today. She's calling because she's worried and she wants to know what I think about her leg as she wants a 2nd opinion. Her leg is not swollen. She feels her leg is probably still red because it was this afternoon, however she isn't sure as it is in an [place] boot. She denies pain. She's very concerned. I note that it is very difficult for me to give my input and recommendations when I cannot see the leg.   From reviewing the EMR, she does have breast cancer. It does not seem like she has an DVT as there is no pain or size discrepancy per her account.  We discuss reasons to seek medical care immediately. No fevers to suggest infection and I would suspect a wound care physician would recognize that. Suggested patient follow up with her PCP or seek medical care sooner if things change: swelling, pain, fevers.  Archie Patten, MD [facility] Family Medicine Resident  11/25/2015, 7:47 PM

## 2015-12-01 ENCOUNTER — Encounter: Payer: Self-pay | Admitting: Internal Medicine

## 2015-12-01 ENCOUNTER — Ambulatory Visit (INDEPENDENT_AMBULATORY_CARE_PROVIDER_SITE_OTHER): Payer: Commercial Managed Care - HMO | Admitting: Internal Medicine

## 2015-12-01 VITALS — BP 123/54 | HR 104 | Temp 97.2°F | Ht 62.0 in | Wt 215.3 lb

## 2015-12-01 DIAGNOSIS — J441 Chronic obstructive pulmonary disease with (acute) exacerbation: Secondary | ICD-10-CM

## 2015-12-01 DIAGNOSIS — C7951 Secondary malignant neoplasm of bone: Secondary | ICD-10-CM

## 2015-12-01 DIAGNOSIS — N183 Chronic kidney disease, stage 3 (moderate): Secondary | ICD-10-CM

## 2015-12-01 DIAGNOSIS — L539 Erythematous condition, unspecified: Secondary | ICD-10-CM

## 2015-12-01 DIAGNOSIS — E1122 Type 2 diabetes mellitus with diabetic chronic kidney disease: Secondary | ICD-10-CM | POA: Diagnosis not present

## 2015-12-01 DIAGNOSIS — C50919 Malignant neoplasm of unspecified site of unspecified female breast: Secondary | ICD-10-CM | POA: Diagnosis not present

## 2015-12-01 LAB — POCT GLYCOSYLATED HEMOGLOBIN (HGB A1C): HEMOGLOBIN A1C: 6.7

## 2015-12-01 MED ORDER — TORSEMIDE 20 MG PO TABS: 20.0000 mg | ORAL_TABLET | ORAL | Status: DC

## 2015-12-01 MED ORDER — POTASSIUM CHLORIDE CRYS ER 20 MEQ PO TBCR: 40.0000 meq | EXTENDED_RELEASE_TABLET | Freq: Two times a day (BID) | ORAL | Status: AC

## 2015-12-01 MED ORDER — ISOSORBIDE MONONITRATE ER 60 MG PO TB24: 60.0000 mg | ORAL_TABLET | Freq: Every day | ORAL | Status: AC

## 2015-12-01 MED ORDER — DOXYCYCLINE HYCLATE 100 MG PO CAPS: 100.0000 mg | ORAL_CAPSULE | Freq: Two times a day (BID) | ORAL | Status: DC

## 2015-12-01 MED ORDER — MIRTAZAPINE 15 MG PO TABS: 15.0000 mg | ORAL_TABLET | Freq: Every day | ORAL | Status: AC

## 2015-12-01 MED ORDER — PREDNISONE 50 MG PO TABS: ORAL_TABLET | ORAL | Status: DC

## 2015-12-01 NOTE — Assessment & Plan Note (Signed)
Well controlled. HgB A1C 6.7 today. Continue current diet regimen.

## 2015-12-01 NOTE — Progress Notes (Signed)
Subjective:    Anita [name] - 70 y.o. female MRN [id number]  Date of birth: Dec 03, 1945  HPI  Anita [name] is here for follow up of chronic medical conditions.  T2DM: Diet controlled. Last HgB A1C 6.5 (12/16). CBGs checked weekly at wound care.   COPD: Patient reports that chronic cough has increased in frequency. She also notes new sputum production x2 weeks. Has been using O2 more than usual when ambulating but still using it at home level (4L). Afebrile at home.  LE Redness: Patient reports concern of redness to her right lower extremity. States she noticed it was red at her wound care visit this past Friday. Her wound care doctor reportedly told her she may need to see vascular surgery. It is not painful and she is able to ambulate without issue.   -  reports that she quit smoking about 3 years ago. Her smoking use included Cigarettes. She started smoking about 54 years ago. She has a 100 pack-year smoking history. She has never used smokeless tobacco. - Review of Systems: Per HPI. - Past Medical History: Patient Active Problem List   Diagnosis Date Noted  . COPD exacerbation ([place]) 12/01/2015  . Erythema of lower extremity 12/01/2015  . Palliative care encounter   . Wound cellulitis   . Severe sepsis ([place]) 05/12/2015  . Sepsis ([place]) 05/12/2015  . AKI (acute kidney injury) ([place]) 05/12/2015  . Leg wound, right 05/12/2015  . Laceration   . Absolute anemia   . CAD in native artery   . Venous stasis ulcer ([place])   . COLD (chronic obstructive lung disease) ([place])   . Leg laceration 04/04/2015  . Laceration of lower extremity 04/04/2015  . Venous stasis of lower extremity 12/05/2014  . Intertrigo 02/19/2014  . 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 Hyperlipidemia 02/11/2013  . Breast cancer metastasized to bone ([place]) 10/14/2012  . Chronic respiratory failure ([place]) 10/06/2012  . Chronic diastolic heart failure ([place]) 10/06/2012  . CAD (coronary artery disease) 10/06/2012  . CKD  (chronic kidney disease), stage IV ([place]) 10/06/2012  . Anemia of chronic disease 10/06/2012  . Breast mass, right 10/06/2012  . History of tobacco abuse 10/06/2012  . COPD (chronic obstructive pulmonary disease) ([place]) 09/27/2012  . DM2 (diabetes mellitus, type 2) ([place]) 09/26/2012   - Medications: reviewed and updated Current Outpatient Prescriptions  Medication Sig Dispense Refill  . albuterol (PROVENTIL HFA;VENTOLIN HFA) 108 (90 BASE) MCG/ACT inhaler Inhale 2 puffs into the lungs every 6 (six) hours as needed for wheezing or shortness of breath. 3 Inhaler 3  . Ascorbic Acid (VITAMIN C PO) Take 500 mg by mouth daily.     [name] aspirin EC 81 MG EC tablet Take 1 tablet (81 mg total) by mouth daily. 30 tablet 3  . atorvastatin (LIPITOR) 40 MG tablet Take 1 tablet (40 mg total) by mouth daily. 90 tablet 3  . bag balm OINT ointment Apply 1 g topically as needed for dry skin or irritation. 300 g 11  . clotrimazole (LOTRIMIN) 1 % cream Apply 1 application topically daily. 113 g 3  . denosumab (XGEVA) 120 MG/1.7ML SOLN injection Inject 120 mg into the skin once. Monthly at [facility]    . docusate sodium (COLACE) 100 MG capsule Take 100 mg by mouth daily.    [name] doxycycline (VIBRAMYCIN) 100 MG capsule Take 1 capsule (100 mg total) by mouth 2 (two) times daily. 10 capsule 0  . fulvestrant (FASLODEX) 250 MG/5ML injection Inject 250 mg  into the muscle once. One injection each buttock over 1-2 minutes. Warm prior to use.    . isosorbide mononitrate (IMDUR) 60 MG 24 hr tablet Take 1 tablet (60 mg total) by mouth daily. 90 tablet 3  . mirtazapine (REMERON) 15 MG tablet Take 1 tablet (15 mg total) by mouth at bedtime. 30 tablet 5  . nitroGLYCERIN (NITROSTAT) 0.4 MG SL tablet Place 1 tablet (0.4 mg total) under the tongue every 5 (five) minutes as needed for chest pain. (Patient not taking: Reported on 10/11/2015) 30 tablet 0  . NON FORMULARY Place 3 L into the nose as needed (oxygen).     . palbociclib (IBRANCE) 100 MG  capsule Take 1 capsule (100 mg total) by mouth daily with breakfast. Take whole with food. 21 capsule 0  . potassium chloride SA (K-DUR,KLOR-CON) 20 MEQ tablet Take 2 tablets (40 mEq total) by mouth 2 (two) times daily. 360 tablet 6  . predniSONE (DELTASONE) 50 MG tablet Take one tablet daily for 5 days. 5 tablet 0  . torsemide (DEMADEX) 20 MG tablet Take 1-4 tablets (20-80 mg total) by mouth See admin instructions. 90 tablet 3  . VITAMIN D, CHOLECALCIFEROL, PO Take 1,000 mg by mouth daily.      No current facility-administered medications for this visit.       Objective:   Physical Exam BP 123/54 mmHg  Pulse 104  Temp(Src) 97.2 F (36.2 C) (Oral)  Ht 5\' 2"  (1.575 m)  Wt 215 lb 4.8 oz (97.659 kg)  BMI 39.37 kg/m2 Gen: Non-toxic appearing female  CV: RRR, good S1/S2, no murmur. Negative homan's sign in R LE. Pedal pulses intact.  Resp: Dyspnea with exertion upon entering exam room. Speaks in full sentences but must pause in between for deep breath. O2 with her but [place] not in place. Diffuse inspiratory and expiratory wheezes. Diminished air movement. No retractions present.  Skin: Unna boot in place on presentation and unwrapped. Change in skin color to the R LE. Small, well healing wounds visible. Mild edema present above and below where unna boot previously in place. No increased warmth to palpation compared to L LE. No TTP.           Assessment & Plan:   COPD exacerbation ([place]) Concerned for a COPD exacerbation given new sputum production, worsening of cough, and lung exam worsened from previous office visits. Patient has been resistant to controller medications in the past.  -Rx for 5 day course of Prednisone 50 mg  -Rx for 5 day course of Doxycycline 100 mg BID  -continue albuterol and home O2  -return precautions given   DM2 (diabetes mellitus, type 2) ([place]) Well controlled. HgB A1C 6.7 today. Continue current diet regimen.   Erythema of lower extremity Patient with  history of chronic venous stasis. Appears more consistent with skin changes of venous stasis, although patient reporting a change in color. Low concern for cellulitis but Doxycyline prescribed for COPD exacerbation would likely cover bacterial cellulitis. No signs/symptoms of DVT.  -will not re-wrap leg to see if improves color  -follow up at wound care tomorrow  -follow up at [facility] early next week to monitor for signs of cellulitis      Phill Myron, D.O. 12/01/2015, 7:09 PM PGY-1, [place]

## 2015-12-01 NOTE — Assessment & Plan Note (Addendum)
Concerned for a COPD exacerbation given new sputum production, worsening of cough, and lung exam worsened from previous office visits. Patient has been resistant to controller medications in the past.  -Rx for 5 day course of Prednisone 50 mg  -Rx for 5 day course of Doxycycline 100 mg BID  -continue albuterol and home O2  -return precautions given

## 2015-12-01 NOTE — Assessment & Plan Note (Addendum)
Patient with history of chronic venous stasis. Appears more consistent with skin changes of venous stasis, although patient reporting a change in color. Low concern for cellulitis but Doxycyline prescribed for COPD exacerbation would likely cover bacterial cellulitis. No signs/symptoms of DVT.  -will not re-wrap leg to see if improves color  -follow up at wound care tomorrow  -follow up at [facility] early next week to monitor for signs of cellulitis

## 2015-12-02 ENCOUNTER — Telehealth: Payer: Self-pay | Admitting: *Deleted

## 2015-12-02 ENCOUNTER — Encounter (HOSPITAL_BASED_OUTPATIENT_CLINIC_OR_DEPARTMENT_OTHER): Payer: Commercial Managed Care - HMO | Attending: Internal Medicine

## 2015-12-02 ENCOUNTER — Telehealth: Payer: Self-pay | Admitting: Hematology

## 2015-12-02 DIAGNOSIS — Z923 Personal history of irradiation: Secondary | ICD-10-CM | POA: Insufficient documentation

## 2015-12-02 DIAGNOSIS — J449 Chronic obstructive pulmonary disease, unspecified: Secondary | ICD-10-CM | POA: Diagnosis not present

## 2015-12-02 DIAGNOSIS — I87331 Chronic venous hypertension (idiopathic) with ulcer and inflammation of right lower extremity: Secondary | ICD-10-CM | POA: Diagnosis not present

## 2015-12-02 DIAGNOSIS — L97911 Non-pressure chronic ulcer of unspecified part of right lower leg limited to breakdown of skin: Secondary | ICD-10-CM | POA: Insufficient documentation

## 2015-12-02 DIAGNOSIS — E11622 Type 2 diabetes mellitus with other skin ulcer: Secondary | ICD-10-CM | POA: Diagnosis not present

## 2015-12-02 DIAGNOSIS — I252 Old myocardial infarction: Secondary | ICD-10-CM | POA: Diagnosis not present

## 2015-12-02 NOTE — Telephone Encounter (Signed)
Patient called she say MD left her leg unwrapped yesterday and when she scratched it today it started bleeding very badly. She states she called EMS and they wrapped it up for her. Has appointment later today with wound care center. FYI to PCP.

## 2015-12-02 NOTE — Telephone Encounter (Signed)
returned call and no answer °

## 2015-12-06 ENCOUNTER — Telehealth: Payer: Self-pay | Admitting: Hematology

## 2015-12-06 NOTE — Telephone Encounter (Signed)
pt called to r/s appt to later date due to going out of town....pt ok and aware of new d.t

## 2015-12-08 ENCOUNTER — Telehealth: Payer: Self-pay | Admitting: *Deleted

## 2015-12-08 DIAGNOSIS — I87331 Chronic venous hypertension (idiopathic) with ulcer and inflammation of right lower extremity: Secondary | ICD-10-CM | POA: Diagnosis not present

## 2015-12-08 LAB — GLUCOSE, CAPILLARY: Glucose-Capillary: 128 mg/dL — ABNORMAL HIGH (ref 65–99)

## 2015-12-08 NOTE — Telephone Encounter (Signed)
Received faxed request from [facility] for clarification on torsemide 20 mg Rx missing dosing frequency Please advise DUCATTE, Orvis Brill, RN

## 2015-12-15 DIAGNOSIS — I87331 Chronic venous hypertension (idiopathic) with ulcer and inflammation of right lower extremity: Secondary | ICD-10-CM | POA: Diagnosis not present

## 2015-12-15 LAB — GLUCOSE, CAPILLARY: Glucose-Capillary: 200 mg/dL — ABNORMAL HIGH (ref 65–99)

## 2015-12-16 ENCOUNTER — Other Ambulatory Visit: Payer: Commercial Managed Care - HMO

## 2015-12-16 ENCOUNTER — Ambulatory Visit: Payer: Commercial Managed Care - HMO | Admitting: Hematology

## 2015-12-16 ENCOUNTER — Ambulatory Visit: Payer: Commercial Managed Care - HMO

## 2015-12-19 ENCOUNTER — Telehealth: Payer: Self-pay | Admitting: Family Medicine

## 2015-12-19 NOTE — Telephone Encounter (Signed)
[facility] After Hours Telephone Line  Number received via page: [phone] Person calling: THALEIA COZENS Relationship to patient: Self  Reason for call: Left leg pain  States that since the rain started, she started having pain in her left leg. She is asking what she can take to help with pain. Advised she can take Tylenol. She states she will try that.  Cordelia Poche, MD PGY-3, [facility] 12/19/2015, 4:14 AM

## 2015-12-22 ENCOUNTER — Telehealth: Payer: Self-pay

## 2015-12-22 ENCOUNTER — Telehealth: Payer: Self-pay | Admitting: Oncology

## 2015-12-22 NOTE — Telephone Encounter (Signed)
error 

## 2015-12-22 NOTE — Telephone Encounter (Signed)
Patient calling regarding left sided knee pain that she rates an 8/10 that wakes her up at night when she is sleeping.  Patient was unable to complete full sentences and was stumbling over her words- not making sense at times.  She admits to using bengay on the knee, taking an OTC "pain reliever - 1 tab every 2 hrs- but doesn't know what it is and was unable to spell the name of it.  She however denies any narcotic use. Writer offered her an appt with [facility] today which patient turned down stating that she has an appt with "wound care clinic" today and will see Dr. Burr Medico tomorrow.  She kept mumbling something about seeing Dr. Sondra Come and also wanted to know if cancer cells spread, because she was wondering whether she has bone cancer. Writer encouraged patient to go to the emergency dept if her symptoms worsened.

## 2015-12-22 NOTE — Telephone Encounter (Signed)
Anita [name] called and said her left knee has been hurting since Sunday.  She is able to walk but said the pain is going from her knee up her leg.  She is wondering if she needs a scan.  She has an appointment with Dr. Burr Medico tomorrow.

## 2015-12-22 NOTE — Telephone Encounter (Signed)
Left a message for Anita [name] to keep her follow up appointment with Dr. Burr Medico tomorrow per Dr. Sondra Come.

## 2015-12-23 ENCOUNTER — Telehealth: Payer: Self-pay | Admitting: Hematology

## 2015-12-23 ENCOUNTER — Ambulatory Visit (HOSPITAL_BASED_OUTPATIENT_CLINIC_OR_DEPARTMENT_OTHER): Payer: Commercial Managed Care - HMO

## 2015-12-23 ENCOUNTER — Other Ambulatory Visit (HOSPITAL_BASED_OUTPATIENT_CLINIC_OR_DEPARTMENT_OTHER): Payer: Commercial Managed Care - HMO

## 2015-12-23 ENCOUNTER — Ambulatory Visit (HOSPITAL_BASED_OUTPATIENT_CLINIC_OR_DEPARTMENT_OTHER): Payer: Commercial Managed Care - HMO | Admitting: Hematology

## 2015-12-23 ENCOUNTER — Encounter: Payer: Self-pay | Admitting: Hematology

## 2015-12-23 ENCOUNTER — Telehealth: Payer: Self-pay | Admitting: *Deleted

## 2015-12-23 VITALS — BP 118/46 | HR 102 | Temp 97.9°F | Resp 18 | Ht 62.0 in | Wt 221.0 lb

## 2015-12-23 DIAGNOSIS — I2510 Atherosclerotic heart disease of native coronary artery without angina pectoris: Secondary | ICD-10-CM

## 2015-12-23 DIAGNOSIS — C7951 Secondary malignant neoplasm of bone: Secondary | ICD-10-CM | POA: Diagnosis not present

## 2015-12-23 DIAGNOSIS — C50919 Malignant neoplasm of unspecified site of unspecified female breast: Secondary | ICD-10-CM

## 2015-12-23 DIAGNOSIS — C50911 Malignant neoplasm of unspecified site of right female breast: Secondary | ICD-10-CM | POA: Diagnosis not present

## 2015-12-23 DIAGNOSIS — C7800 Secondary malignant neoplasm of unspecified lung: Secondary | ICD-10-CM

## 2015-12-23 DIAGNOSIS — N189 Chronic kidney disease, unspecified: Secondary | ICD-10-CM | POA: Diagnosis not present

## 2015-12-23 DIAGNOSIS — Z5111 Encounter for antineoplastic chemotherapy: Secondary | ICD-10-CM | POA: Diagnosis not present

## 2015-12-23 DIAGNOSIS — D631 Anemia in chronic kidney disease: Secondary | ICD-10-CM | POA: Diagnosis not present

## 2015-12-23 DIAGNOSIS — I2583 Coronary atherosclerosis due to lipid rich plaque: Secondary | ICD-10-CM

## 2015-12-23 DIAGNOSIS — J410 Simple chronic bronchitis: Secondary | ICD-10-CM

## 2015-12-23 DIAGNOSIS — D638 Anemia in other chronic diseases classified elsewhere: Secondary | ICD-10-CM

## 2015-12-23 DIAGNOSIS — N184 Chronic kidney disease, stage 4 (severe): Secondary | ICD-10-CM

## 2015-12-23 LAB — CBC & DIFF AND RETIC
BASO%: 0.5 % (ref 0.0–2.0)
BASOS ABS: 0 10*3/uL (ref 0.0–0.1)
EOS%: 1.4 % (ref 0.0–7.0)
Eosinophils Absolute: 0 10*3/uL (ref 0.0–0.5)
HCT: 25.9 % — ABNORMAL LOW (ref 34.8–46.6)
HGB: 8.4 g/dL — ABNORMAL LOW (ref 11.6–15.9)
IMMATURE RETIC FRACT: 17.3 % — AB (ref 1.60–10.00)
LYMPH#: 0.3 10*3/uL — AB (ref 0.9–3.3)
LYMPH%: 15.6 % (ref 14.0–49.7)
MCH: 28.8 pg (ref 25.1–34.0)
MCHC: 32.4 g/dL (ref 31.5–36.0)
MCV: 88.7 fL (ref 79.5–101.0)
MONO#: 0.1 10*3/uL (ref 0.1–0.9)
MONO%: 5.2 % (ref 0.0–14.0)
NEUT#: 1.6 10*3/uL (ref 1.5–6.5)
NEUT%: 77.3 % — AB (ref 38.4–76.8)
PLATELETS: 175 10*3/uL (ref 145–400)
RBC: 2.92 10*6/uL — AB (ref 3.70–5.45)
RDW: 18.2 % — ABNORMAL HIGH (ref 11.2–14.5)
RETIC CT ABS: 62.2 10*3/uL (ref 33.70–90.70)
Retic %: 2.13 % — ABNORMAL HIGH (ref 0.70–2.10)
WBC: 2.1 10*3/uL — ABNORMAL LOW (ref 3.9–10.3)

## 2015-12-23 LAB — COMPREHENSIVE METABOLIC PANEL
ALT: 36 U/L (ref 0–55)
ANION GAP: 13 meq/L — AB (ref 3–11)
AST: 41 U/L — AB (ref 5–34)
Albumin: 3.1 g/dL — ABNORMAL LOW (ref 3.5–5.0)
Alkaline Phosphatase: 108 U/L (ref 40–150)
BILIRUBIN TOTAL: 0.4 mg/dL (ref 0.20–1.20)
BUN: 35.4 mg/dL — ABNORMAL HIGH (ref 7.0–26.0)
CO2: 23 meq/L (ref 22–29)
Calcium: 9.2 mg/dL (ref 8.4–10.4)
Chloride: 103 mEq/L (ref 98–109)
Creatinine: 2.1 mg/dL — ABNORMAL HIGH (ref 0.6–1.1)
EGFR: 24 mL/min/{1.73_m2} — ABNORMAL LOW (ref >90)
GLUCOSE: 262 mg/dL — AB (ref 70–140)
Potassium: 3.6 mEq/L (ref 3.5–5.1)
SODIUM: 139 meq/L (ref 136–145)
TOTAL PROTEIN: 6.5 g/dL (ref 6.4–8.3)

## 2015-12-23 LAB — TECHNOLOGIST REVIEW

## 2015-12-23 MED ORDER — HYDROCODONE-ACETAMINOPHEN 5-325 MG PO TABS: 1.0000 | ORAL_TABLET | Freq: Four times a day (QID) | ORAL | Status: DC | PRN

## 2015-12-23 MED ORDER — DENOSUMAB 120 MG/1.7ML ~~LOC~~ SOLN
120.0000 mg | Freq: Once | SUBCUTANEOUS | Status: AC
  Administered 2015-12-23: 120 mg via SUBCUTANEOUS
  Filled 2015-12-23: qty 1.7

## 2015-12-23 MED ORDER — FULVESTRANT 250 MG/5ML IM SOLN
500.0000 mg | INTRAMUSCULAR | Status: DC
  Administered 2015-12-23: 500 mg via INTRAMUSCULAR
  Filled 2015-12-23: qty 10

## 2015-12-23 NOTE — Telephone Encounter (Signed)
per pof to sch pt appt-sent Dr Burr Medico email to see when she can see pt in afternoon. will call pt after reply-gave avs

## 2015-12-23 NOTE — Progress Notes (Signed)
[place], DO [place] [address]  CHIEF COMPLAIN: follow up metastatic breast cancer     Breast cancer metastasized to bone ([place])   10/08/2012 Imaging CT chest, abdomen and pelvis showed extensive sclerotic lesions throughout the bones, 4.9 x 3.4 cm soft tissue versus hematoma anterior to the spleen. 6 mm groundglass nodule in the right upper lobe of lung.   10/10/2012 Initial Biopsy Right breast core needle biopsy showed invasive lobular carcinoma, grade 2. Left iliac crest bone biopsy showed metastatic carcinoma, morphology similar to breast biopsy.   10/14/2012 Initial Diagnosis Breast cancer metastasized to bone [facility])   11/2012 - 07/17/2015 Anti-estrogen oral therapy Letrozole 26m daily, add palbociclib on 01/01/2014, dose modified to 2 weeks on and 1 week off with cycle 3, stopped due to disease progression   02/02/2014 - 02/24/2014 Radiation Therapy Palliative radiation to left and the right femur, 30 gray in 10 fractions, by Dr. KSondra Come   09/01/2014 Imaging CT chest abdomen and pelvis showed several small pulmonary nodules are not changed. Stable widespread bone metastasis.   10/10/2014 Receptors her2 ER 100% positive, PR negative, HER-2 negative.   02/08/2015 Imaging PET scan showed a stable 8 mm right level III lymph node with SUV 8.8, no new neck adenopathy, stable diffuse osseous metastasis, stable for many nodules.   07/04/2015 Progression  PET scan showed overal dasease progression in the bones , slightly increased hypermetabolic activ of the lung nodules , similar appearance of hypermetaabolic rght level III lymph node.   07/18/2015 -  Anti-estrogen oral therapy  fulvestrant 500 mg every 4 weeks ( loading dose every  weeksX2),  and Ibrance 1050mdaily,  3 week on, 1 week off   11/04/2015 Imaging PET scan showed interval response to therapy, especially bone metastasis. Right level III cervical node and right upper  lobe pulmonary nodule also demonstrate decreased hypermetabolism.     CURRENT THERAPY:   1. She started letrozole in 11/2012.  Added Palbociclib 125 mg daily for 21 days on and 7 days off on 01/01/2014 with disease progression. Dose modified to 2 weeks on 1 week off with cycle 3.  Letrozole changed to fulvestrant on 07/18/2015 due to disease progress, and Ibrance changed to 10019maily, 3 weeks on and one week off.  2. zometa added on 01/01/2014 for metastatic bone disease. Zometa held due to renal insufficiency and [facility] to [facility] skeletal disease progression and safe renal profile.   INTERVAL HISTORY:  Anita [name] 70. female with a history of right breast cancer with metastases to the bone/lungs here for follow-up.  She reports significant left hip and lateral thigh pain for the past one week, she denies any injury or fall. It's persistent, 6-8/10, she denies any other new pain. She Otherwise is clinically stable, still has mild to moderate dyspnea on exertion, she is able to take care of herself and goes to school. She has skin redness and edema in the left lower extremity, no open wound in the left leg,  no fever or chills.   MEDICAL HISTORY: Past Medical History  Diagnosis Date  . Fibroid   . Morbid [facility] ([place] . 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 COPD (chronic obstructive pulmonary disease) ([facility] . Chronic diastolic CHF (congestive heart failure) ([facility] . NSTEMI (non-ST elevated myocardial infarction) ([place] [place]  Cath November 2014 LAD 40% stenosis, first diagonal 80% stenosis, circumflex 20% stenosis, right coronary artery occluded. The EF was 35-40% at that time.  . Anemia     a. Adm 09/2012 with melena, Hgb 5.8 -> transfused. EGD/colonoscopy unrevealing.  . Breast cancer ([place])     a. Mets to bone. ER 100%/ PR 0%/Her2 neu negative.  [name] Ulcers of both lower legs ([place])   . Tobacco abuse   . Hyperlipidemia 02/11/2013  .  Chronic respiratory failure ([place])     a. On O2 qhs. also portable O2.  . Chronic renal insufficiency   . Lesion of vocal cord     a. CT 05/2013 concerning for tumor.  . Shortness of breath   . Depression   . Diabetes mellitus without complication ([place])   . Anginal pain ([place])   . Dementia     very mild  . Radiation 02/02/14-02/24/14    left and right femur 30 gray    ALLERGIES:  is allergic to omnipaque and benzene.  MEDICATIONS:    Medication List       This list is accurate as of: 12/23/15  8:34 AM.  Always use your most recent med list.               albuterol 108 (90 Base) MCG/ACT inhaler  Commonly known as:  PROVENTIL HFA;VENTOLIN HFA  Inhale 2 puffs into the lungs every 6 (six) hours as needed for wheezing or shortness of breath.     aspirin 81 MG EC tablet  Take 1 tablet (81 mg total) by mouth daily.     atorvastatin 40 MG tablet  Commonly known as:  LIPITOR  Take 1 tablet (40 mg total) by mouth daily.     bag balm Oint ointment  Apply 1 g topically as needed for dry skin or irritation.     clotrimazole 1 % cream  Commonly known as:  LOTRIMIN  Apply 1 application topically daily.     docusate sodium 100 MG capsule  Commonly known as:  COLACE  Take 100 mg by mouth daily.     doxycycline 100 MG capsule  Commonly known as:  VIBRAMYCIN  Take 1 capsule (100 mg total) by mouth 2 (two) times daily.     FASLODEX 250 MG/5ML injection  Generic drug:  fulvestrant  Inject 250 mg into the muscle once. One injection each buttock over 1-2 minutes. Warm prior to use.     isosorbide mononitrate 60 MG 24 hr tablet  Commonly known as:  IMDUR  Take 1 tablet (60 mg total) by mouth daily.     mirtazapine 15 MG tablet  Commonly known as:  REMERON  Take 1 tablet (15 mg total) by mouth at bedtime.     nitroGLYCERIN 0.4 MG SL tablet  Commonly known as:  NITROSTAT  Place 1 tablet (0.4 mg total) under the tongue every 5 (five) minutes as needed for chest pain.     NON  FORMULARY  Place 3 L into the nose as needed (oxygen).     palbociclib 100 MG capsule  Commonly known as:  IBRANCE  Take 1 capsule (100 mg total) by mouth daily with breakfast. Take whole with food.     potassium chloride SA 20 MEQ tablet  Commonly known as:  K-DUR,KLOR-CON  Take 2 tablets (40 mEq total) by mouth 2 (two) times daily.     predniSONE 50 MG tablet  Commonly known as:  DELTASONE  Take one tablet daily for  5 days.     torsemide 20 MG tablet  Commonly known as:  DEMADEX  Take 1-4 tablets (20-80 mg total) by mouth See admin instructions.     VITAMIN C PO  Take 500 mg by mouth daily.     VITAMIN D (CHOLECALCIFEROL) PO  Take 1,000 mg by mouth daily.     XGEVA 120 MG/1.7ML Soln injection  Generic drug:  denosumab  Inject 120 mg into the skin once. Monthly at [facility]        SURGICAL HISTORY:  Past Surgical History  Procedure Laterality Date  . Colonoscopy, esophagogastroduodenoscopy (egd) and esophageal dilation N/A 10/13/2012    Procedure: colonoscopy and egd;  Surgeon: Wonda Horner, MD;  Location: [facility];  Service: Endoscopy;  Laterality: N/A;  . Left heart catheterization with coronary angiogram N/A 07/07/2013    Procedure: LEFT HEART CATHETERIZATION WITH CORONARY ANGIOGRAM;  Surgeon: Peter M Martinique, MD;  Location: [facility] CATH LAB;  Service: Cardiovascular;  Laterality: N/A;   REVIEW OF SYSTEMS:   Constitutional: Denies fevers, chills or abnormal weight loss Eyes: Denies blurriness of vision Ears, nose, mouth, throat, and face: Denies mucositis or sore throat Respiratory: Denies cough, (+) stable dyspnea, no wheezes Cardiovascular: Denies palpitation, chest discomfort or lower extremity swelling Gastrointestinal:  Denies nausea, heartburn or change in bowel habits Skin: Denies abnormal skin rashes Lymphatics: Denies new lymphadenopathy or easy bruising Neurological:Denies numbness, tingling or new weaknesses Behavioral/Psych: Mood is stable, no new changes    All other systems were reviewed with the patient and are negative.  PHYSICAL EXAMINATION: ECOG PERFORMANCE STATUS: 3 Blood pressure 118/46, pulse 102, temperature 97.9 F (36.6 C), temperature source Oral, resp. rate 18, height _0  (1.575 m), weight 221 lb (100.245 kg), SpO2 97 %. GENERAL:alert, no distress and comfortable morbidly obese woman in wheelchair, chronically ill appearing SKIN: skin color, texture, turgor are normal; posterior legs with irritation and erythema with a small abrasion on the posterior left thigh with mild TTP.  EYES: normal, Conjunctiva are pink and non-injected, sclera clear OROPHARYNX:no exudate, no erythema and lips, buccal mucosa, and tongue normal  NECK: supple, thyroid normal size, non-tender, without nodularity LYMPH:  no palpable lymphadenopathy in the cervical, axillary LUNGS: coarse breathing with slightly increased breathing rate, (+) crackers on bilateral bases HEART: regular rate & rhythm and no murmurs and 2+ lower extremity edema BREAST: deferred ABDOMEN:abdomen soft, non-tender and normal bowel sounds; obese Musculoskeletal:no cyanosis of digits and no clubbing; right lower extremities are wrapped with gauze up to knees. Left leg exam showed pitting edema to knee, mild diffuse skin erythema above the ankle, scratch markers, no open wound. Skin temperature is normal. NEURO: alert & oriented x 3 with fluent speech, no focal motor/sensory deficits  LABORATORY DATA: CBC Latest Ref Rng 12/23/2015 11/03/2015 10/11/2015  WBC 3.9 - 10.3 10e3/uL 2.1(L) 2.3(L) 2.6(L)  Hemoglobin 11.6 - 15.9 g/dL 8.4(L) 8.7(L) 9.0(L)  Hematocrit 34.8 - 46.6 % 25.9(L) 27.0(L) 27.7(L)  Platelets 145 - 400 10e3/uL 175 142(L) 172    CMP Latest Ref Rng 12/23/2015 11/03/2015 10/11/2015  Glucose 70 - 140 mg/dl 262(H) 166(H) 222(H)  BUN 7.0 - 26.0 mg/dL 35.4(H) 32.0(H) 27.6(H)  Creatinine 0.6 - 1.1 mg/dL 2.1(H) 1.6(H) 1.7(H)  Sodium 136 - 145 mEq/L 139 138 141  Potassium 3.5 - 5.1  mEq/L 3.6 3.9 4.1  CO2 22 - 29 mEq/L _1 Calcium 8.4 - 10.4 mg/dL 9.2 9.1 9.4  Total Protein 6.4 - 8.3 g/dL 6.5 6.7 6.9  Total Bilirubin 0.20 - 1.20 mg/dL 0.40 0.30 0.45  Alkaline Phos 40 - 150 U/L 108 115 104  AST 5 - 34 U/L 41(H) 35(H) 39(H)  ALT 0 - 55 U/L 36 26 31   ANC 1.6  CA 27.29    PATHOLOGY REPORT  Diagnosis 10/10/2014 1. Bone, biopsy, left iliac crest - METASTATIC MAMMARY CARCINOMA, SEE COMMENT. 2. Breast, right, needle core biopsy - INVASIVE MAMMARY CARCINOMA, SEE COMMENT. Microscopic Comment 1. and 2. The breast biopsy demonstrates definitive morphologic features of invasive mammary carcinoma. Although the grade of tumor is best assessed at resection, with these biopsies, the carcinoma is grade II. An E-Cadherin stain is pending and will be reported as an addendum. The morphology of the metastatic carcinoma in part 2 is identical to the breast biopsy. Breast prognostic studies are pending (part 2) and will be reported as an addendum. The case was reviewed with Dr. Avis Epley who concurs. The case was discussed with Dr. Lamonte Sakai on 10/13/2012.  RADIOGRAPHIC STUDIES:  CT chest abdomen and pelvis 09/01/2014 IMPRESSION: Chest Impression:  1. Stable exam of thorax. 2. Several small smudgy pulmonary nodules are not changed. 3. Stable widespread bony metastasis pneumothorax.  Abdomen / Pelvis Impression:  1. Stable small periaortic retroperitoneal lymph node. 2. No evidence disease progression. 3. Stable widespread skeletal metastasis. 4. Left sided staghorn calculus again demonstrated. 5. Nodular focus in the right kidney at site of prior renal cyst is not changed  PET 11/04/2015 IMPRESSION: 1. Primarily response to therapy. 2. Right level 3 node is similar in size and slightly decreased in hypermetabolism. Index right upper lobe pulmonary nodule is enlarged minimally but demonstrates decreased hypermetabolism. 3. Improvement in widespread osseous metastasis.  No new sites of disease identified. 4. Atherosclerosis, including within the coronary arteries. 5. Pulmonary artery enlargement suggests pulmonary arterial hypertension. 6. Hepatic steatosis and hepatomegaly. 7. Chronic stone filled left renal pelvis.    ASSESSMENT: 70 year old Caucasian female  PLAN:  1.  Metastatic breast cancer (ER pos/PR neg/ HER2 neg) with metastasis to bones and lungs, possible right cervical nodes.  -I reviewed her restaging PET scan from 07/04/2015 with pt in [place], which showed slight disease progression in the bones , lung metastasis are stable in size, but the largest lesion has increased FDG uptake.  She also noticed some new right-sided chest wall pain, possibly related to bone metastasis.  -Given the mild disease progression, I have changed her antiestrogen therapy from letrozole to fulvestrant injection, and continue Ibrance, but changed her Ibrance from 125 mg 2 weeks on, one week off, to 100 mg, 3 weeks on, 1 week off. -restaging PET scan from 11/04/2015 showed good response to bone metastasis, and a stable pulmonary metastasis. No other new lesions. -she is tolerating treatment well, will continue - lab reviewed with her, slightly worse anemia, mild leukopenia from Ibrance, ANC 1.6.   2. bone metastasis - continue Xgeva monthly  -Continue calcium and vitamin D  3. Anemia of chronic disease (CKD and malignancy), and anemia second to Ibrance -- Past work up for reversible causes of anemia were negative.  -Repeated ferritin 185, serum iron 44 (normal) and saturation 16% on 09/15/2015  -She is not taking iron pill, could not tolerate.  -I do not recommend ESA, due to her underline malignancy  -anemia improved after blood transfusion on 09/17/2015  -worsening anemia again, Hb 8.3 today, will give 1u RBC next week   4. Left hip and thigh pain -I will obtain a left hip x-ray to  rule out fracture and lytic bone mets  -if x-ray negative, and her pain  persistent, will consider a MRI to rule out new bone mets  -I gave her a prescription of vicodin today   5. CHF -she has slightly worsening dyspnea, bilateral lung rales on exam, probable pulmonary edema. -I encouraged her to call her cardiologist for follow-up.  6. Diabetes mellitus, type II.  -not well not medication, diet controlled most of time  7. COPD: On albuterol prn. She remains on home oxygen.  8. Chronic renal insufficiency, stage IV   -Stable. -she will follow-up with Dr. Florene Glen  9: Bilateral leg venous stasis ulcers -She still follows up with his wound clinic once a week with dressing change -her right LE wound is healing well   -the left LE skin erythema is likely related to venous stasis, no high suspicion for cellulitis   10. Mild leukopenia and thrombocytopenia -Secondary to [facility] continue monitoring.  All questions were answered. The patient knows to call the clinic with any problems, questions or concerns. We can certainly see the patient much sooner if necessary.  Plan -left hip x-ray today, I gave her a prescription of vicodin 5/369m as needed today  -1u RBC next Saturday, type and cross the day before  -She'll continue Ibrance  -continue fulvestrant 5046mand Xgeva monthly, injections today                         -I'll see her back in 4 weeks with lab, including CBC, CMP  -I encouraged her to call me if her left hip pain gets worse     I spent 25 minutes counseling the patient face to face. The total time spent in the appointment was 30 minutes.   FeTruitt Merle4/28/2017

## 2015-12-23 NOTE — Telephone Encounter (Signed)
Per staff message and POF I have scheduled appts. Advised scheduler of appts. JMW  

## 2015-12-24 LAB — CANCER ANTIGEN 27.29: CAN 27.29: 92.2 U/mL — AB (ref 0.0–38.6)

## 2015-12-26 ENCOUNTER — Telehealth: Payer: Self-pay | Admitting: Hematology

## 2015-12-26 ENCOUNTER — Ambulatory Visit (HOSPITAL_COMMUNITY)
Admission: RE | Admit: 2015-12-26 | Discharge: 2015-12-26 | Disposition: A | Discharge status: Home / Self Care | Payer: Commercial Managed Care - HMO | Source: Ambulatory Visit | Attending: Hematology | Admitting: Hematology

## 2015-12-26 ENCOUNTER — Encounter (HOSPITAL_BASED_OUTPATIENT_CLINIC_OR_DEPARTMENT_OTHER): Payer: Commercial Managed Care - HMO | Attending: Internal Medicine

## 2015-12-26 DIAGNOSIS — I87333 Chronic venous hypertension (idiopathic) with ulcer and inflammation of bilateral lower extremity: Secondary | ICD-10-CM | POA: Diagnosis not present

## 2015-12-26 DIAGNOSIS — D649 Anemia, unspecified: Secondary | ICD-10-CM | POA: Insufficient documentation

## 2015-12-26 DIAGNOSIS — D638 Anemia in other chronic diseases classified elsewhere: Secondary | ICD-10-CM

## 2015-12-26 DIAGNOSIS — I252 Old myocardial infarction: Secondary | ICD-10-CM | POA: Insufficient documentation

## 2015-12-26 DIAGNOSIS — I509 Heart failure, unspecified: Secondary | ICD-10-CM | POA: Diagnosis not present

## 2015-12-26 DIAGNOSIS — E119 Type 2 diabetes mellitus without complications: Secondary | ICD-10-CM | POA: Diagnosis not present

## 2015-12-26 DIAGNOSIS — D630 Anemia in neoplastic disease: Secondary | ICD-10-CM | POA: Insufficient documentation

## 2015-12-26 DIAGNOSIS — J449 Chronic obstructive pulmonary disease, unspecified: Secondary | ICD-10-CM | POA: Diagnosis not present

## 2015-12-26 DIAGNOSIS — N184 Chronic kidney disease, stage 4 (severe): Secondary | ICD-10-CM | POA: Insufficient documentation

## 2015-12-26 DIAGNOSIS — D631 Anemia in chronic kidney disease: Secondary | ICD-10-CM | POA: Insufficient documentation

## 2015-12-26 DIAGNOSIS — C50919 Malignant neoplasm of unspecified site of unspecified female breast: Secondary | ICD-10-CM | POA: Insufficient documentation

## 2015-12-26 LAB — GLUCOSE, CAPILLARY: GLUCOSE-CAPILLARY: 129 mg/dL — AB (ref 65–99)

## 2015-12-26 NOTE — Telephone Encounter (Signed)
per pof to sch pt appt-MW sch blood-Dr Burr Medico replied and stated to sch pt on 01/20/16-talked w/pt and gave appt time & date

## 2015-12-30 ENCOUNTER — Other Ambulatory Visit (HOSPITAL_BASED_OUTPATIENT_CLINIC_OR_DEPARTMENT_OTHER): Payer: Commercial Managed Care - HMO

## 2015-12-30 ENCOUNTER — Other Ambulatory Visit: Payer: Self-pay | Admitting: Hematology

## 2015-12-30 ENCOUNTER — Other Ambulatory Visit: Payer: Self-pay | Admitting: *Deleted

## 2015-12-30 ENCOUNTER — Encounter: Payer: Self-pay | Admitting: Hematology

## 2015-12-30 DIAGNOSIS — C50911 Malignant neoplasm of unspecified site of right female breast: Secondary | ICD-10-CM

## 2015-12-30 DIAGNOSIS — C50919 Malignant neoplasm of unspecified site of unspecified female breast: Secondary | ICD-10-CM | POA: Diagnosis present

## 2015-12-30 DIAGNOSIS — C7951 Secondary malignant neoplasm of bone: Principal | ICD-10-CM

## 2015-12-30 DIAGNOSIS — N184 Chronic kidney disease, stage 4 (severe): Secondary | ICD-10-CM | POA: Diagnosis not present

## 2015-12-30 DIAGNOSIS — D638 Anemia in other chronic diseases classified elsewhere: Secondary | ICD-10-CM

## 2015-12-30 DIAGNOSIS — D631 Anemia in chronic kidney disease: Secondary | ICD-10-CM | POA: Diagnosis not present

## 2015-12-30 DIAGNOSIS — D630 Anemia in neoplastic disease: Secondary | ICD-10-CM | POA: Diagnosis not present

## 2015-12-30 LAB — CBC & DIFF AND RETIC
BASO%: 0.4 % (ref 0.0–2.0)
BASOS ABS: 0 10*3/uL (ref 0.0–0.1)
EOS%: 0.8 % (ref 0.0–7.0)
Eosinophils Absolute: 0 10*3/uL (ref 0.0–0.5)
HEMATOCRIT: 26 % — AB (ref 34.8–46.6)
HGB: 8.4 g/dL — ABNORMAL LOW (ref 11.6–15.9)
Immature Retic Fract: 19.2 % — ABNORMAL HIGH (ref 1.60–10.00)
LYMPH%: 14.4 % (ref 14.0–49.7)
MCH: 28.7 pg (ref 25.1–34.0)
MCHC: 32.3 g/dL (ref 31.5–36.0)
MCV: 88.7 fL (ref 79.5–101.0)
MONO#: 0.3 10*3/uL (ref 0.1–0.9)
MONO%: 11.9 % (ref 0.0–14.0)
NEUT%: 72.5 % (ref 38.4–76.8)
NEUTROS ABS: 1.7 10*3/uL (ref 1.5–6.5)
Platelets: 131 10*3/uL — ABNORMAL LOW (ref 145–400)
RBC: 2.93 10*6/uL — AB (ref 3.70–5.45)
RDW: 19.3 % — AB (ref 11.2–14.5)
RETIC %: 2.64 % — AB (ref 0.70–2.10)
RETIC CT ABS: 77.35 10*3/uL (ref 33.70–90.70)
WBC: 2.4 10*3/uL — AB (ref 3.9–10.3)
lymph#: 0.3 10*3/uL — ABNORMAL LOW (ref 0.9–3.3)

## 2015-12-30 LAB — COMPREHENSIVE METABOLIC PANEL
ALBUMIN: 3.2 g/dL — AB (ref 3.5–5.0)
ALK PHOS: 116 U/L (ref 40–150)
ALT: 45 U/L (ref 0–55)
ANION GAP: 10 meq/L (ref 3–11)
AST: 66 U/L — AB (ref 5–34)
BILIRUBIN TOTAL: 0.45 mg/dL (ref 0.20–1.20)
BUN: 32.4 mg/dL — AB (ref 7.0–26.0)
CALCIUM: 8.9 mg/dL (ref 8.4–10.4)
CHLORIDE: 105 meq/L (ref 98–109)
CO2: 23 mEq/L (ref 22–29)
CREATININE: 1.7 mg/dL — AB (ref 0.6–1.1)
EGFR: 30 mL/min/{1.73_m2} — ABNORMAL LOW (ref >90)
Glucose: 170 mg/dl — ABNORMAL HIGH (ref 70–140)
Potassium: 3.8 mEq/L (ref 3.5–5.1)
Sodium: 138 mEq/L (ref 136–145)
Total Protein: 6.7 g/dL (ref 6.4–8.3)

## 2015-12-30 LAB — PREPARE RBC (CROSSMATCH)

## 2015-12-30 LAB — TECHNOLOGIST REVIEW

## 2015-12-30 NOTE — Progress Notes (Signed)
prior auth I left for dr. Burr Medico

## 2015-12-31 ENCOUNTER — Ambulatory Visit (HOSPITAL_BASED_OUTPATIENT_CLINIC_OR_DEPARTMENT_OTHER): Payer: Commercial Managed Care - HMO

## 2015-12-31 ENCOUNTER — Other Ambulatory Visit: Payer: Self-pay | Admitting: Internal Medicine

## 2015-12-31 VITALS — BP 116/49 | HR 80 | Temp 98.3°F | Resp 20

## 2015-12-31 DIAGNOSIS — C50919 Malignant neoplasm of unspecified site of unspecified female breast: Secondary | ICD-10-CM | POA: Diagnosis not present

## 2015-12-31 DIAGNOSIS — N189 Chronic kidney disease, unspecified: Secondary | ICD-10-CM

## 2015-12-31 DIAGNOSIS — D631 Anemia in chronic kidney disease: Secondary | ICD-10-CM

## 2015-12-31 DIAGNOSIS — D638 Anemia in other chronic diseases classified elsewhere: Secondary | ICD-10-CM

## 2015-12-31 MED ORDER — SODIUM CHLORIDE 0.9 % IV SOLN
250.0000 mL | Freq: Once | INTRAVENOUS | Status: AC
  Administered 2015-12-31: 250 mL via INTRAVENOUS

## 2015-12-31 NOTE — Patient Instructions (Signed)
Blood Transfusion   A blood transfusion is a procedure that gives you donated blood through an IV tube. You may need blood because of illness, surgery, or injury. The blood may come from a donor. The blood may also be your own blood that you donated earlier.  The blood you get is made up of different types of cells. You may get:    Red blood cells. These carry oxygen and replace lost blood.    Platelets. These control bleeding.    Plasma. This helps blood to clot.  If you have a clotting disorder, you may also get other types of blood products.   BEFORE THE PROCEDURE   You may have a blood test. This finds out what type of blood you have. It also finds out what kind of blood your body will accept.    If you are going to have a planned surgery, you may donate your own blood. This is done in case you need to have a transfusion.    If you have had an allergic transfusion reaction before, you may be given medicine to help prevent a reaction. Take this medicine only as told by your doctor.   You will have your temperature, blood pressure, and pulse checked.  PROCEDURE    An IV will be started in your hand or arm.    The bag of donated blood will be attached to your IV and run into your vein.    A doctor will regularly check your temperature, blood pressure, and pulse during the procedure. This is done to find any early signs of a transfusion reaction.   If you have any signs or symptoms of a reaction, the procedure may be stopped and you may be given medicine.    When the transfusion is over, your IV will be removed.    Pressure may be applied to the IV site for a few minutes.    A bandage (dressing) will be applied.   The procedure may vary among doctors and hospitals.   AFTER THE PROCEDURE   Your blood pressure, temperature, and pulse will be checked regularly.     This information is not intended to replace advice given to you by your health care provider. Make sure you discuss any questions  you have with your health care provider.     Document Released: 11/09/2008 Document Revised: 09/03/2014 Document Reviewed: 06/23/2014  Elsevier Interactive Patient Education 2016 [employer]

## 2016-01-02 LAB — TYPE AND SCREEN
ABO/RH(D): A POS
Antibody Screen: NEGATIVE
Unit division: 0

## 2016-01-02 LAB — CANCER ANTIGEN 27.29: CA 27.29: 98.5 U/mL — ABNORMAL HIGH (ref 0.0–38.6)

## 2016-01-06 ENCOUNTER — Telehealth: Payer: Self-pay | Admitting: *Deleted

## 2016-01-06 NOTE — Telephone Encounter (Signed)
Patient called stating that her insurance has a claim pending for a nebulizer machine ordered by [place] with [employer].  Patient claims that she didn't receive anything in April but does have a machine from Dr. Lacinda Axon.  Advised patient to have insurance disregard if she never received this machine from 12/18/15.  Will check with RN to see if there is any paperwork for this patient. Anita [name],CMA

## 2016-01-06 NOTE — Telephone Encounter (Signed)
Left message for patient to give [employer] a call regarding insurance claim.  The machine that she received from Dr. Lacinda Axon is an [employer] nebulizer.  Patient would need to contact her insurance and [employer].  Derl Barrow, RN

## 2016-01-09 ENCOUNTER — Telehealth: Payer: Self-pay | Admitting: *Deleted

## 2016-01-09 ENCOUNTER — Telehealth: Payer: Self-pay

## 2016-01-09 NOTE — Telephone Encounter (Signed)
Called patient back on Dr. Alcario Drought behalf. Was unable to leave a message. If patient calls back, please let her know Dr. Juleen China is out of town this week and will not be able to be a judge.

## 2016-01-09 NOTE — Telephone Encounter (Signed)
Patient calling requesting to speak with Dr. Juleen China. Would like MD to give her a call back regarding being a judge for an event shes attending.

## 2016-01-09 NOTE — Telephone Encounter (Signed)
Patient called wondering if Dr Burr Medico would be available this weekend to "judge senior projects at the local high school"?  Patient requesting a call back.

## 2016-01-10 ENCOUNTER — Telehealth: Payer: Self-pay | Admitting: Internal Medicine

## 2016-01-10 NOTE — Telephone Encounter (Signed)
Ms. Coggeshall said she will be judging the senior projects at [facility] [employer] and want to know if you will be coming there to judge also.  Please call patient to inform.

## 2016-01-10 NOTE — Telephone Encounter (Signed)
Left message for Anita [name] on Dr. Alcario Drought behalf that she is not available to judge at the senior projects event.

## 2016-01-19 ENCOUNTER — Other Ambulatory Visit: Payer: Self-pay | Admitting: *Deleted

## 2016-01-19 DIAGNOSIS — C7951 Secondary malignant neoplasm of bone: Principal | ICD-10-CM

## 2016-01-19 DIAGNOSIS — C50919 Malignant neoplasm of unspecified site of unspecified female breast: Secondary | ICD-10-CM

## 2016-01-20 ENCOUNTER — Other Ambulatory Visit: Payer: Self-pay | Admitting: Hematology

## 2016-01-20 ENCOUNTER — Telehealth: Payer: Self-pay | Admitting: Hematology

## 2016-01-20 ENCOUNTER — Ambulatory Visit (HOSPITAL_COMMUNITY)
Admission: RE | Admit: 2016-01-20 | Discharge: 2016-01-20 | Disposition: A | Discharge status: Home / Self Care | Payer: Commercial Managed Care - HMO | Source: Ambulatory Visit | Attending: Hematology | Admitting: Hematology

## 2016-01-20 ENCOUNTER — Encounter: Payer: Self-pay | Admitting: Hematology

## 2016-01-20 ENCOUNTER — Ambulatory Visit (HOSPITAL_BASED_OUTPATIENT_CLINIC_OR_DEPARTMENT_OTHER): Payer: Commercial Managed Care - HMO | Admitting: Hematology

## 2016-01-20 ENCOUNTER — Other Ambulatory Visit (HOSPITAL_COMMUNITY): Payer: Commercial Managed Care - HMO

## 2016-01-20 ENCOUNTER — Other Ambulatory Visit (HOSPITAL_BASED_OUTPATIENT_CLINIC_OR_DEPARTMENT_OTHER): Payer: Commercial Managed Care - HMO

## 2016-01-20 ENCOUNTER — Ambulatory Visit (HOSPITAL_BASED_OUTPATIENT_CLINIC_OR_DEPARTMENT_OTHER): Payer: Commercial Managed Care - HMO

## 2016-01-20 VITALS — BP 126/55 | HR 103 | Temp 98.2°F | Resp 18 | Ht 62.0 in | Wt 220.4 lb

## 2016-01-20 DIAGNOSIS — N183 Chronic kidney disease, stage 3 (moderate): Secondary | ICD-10-CM

## 2016-01-20 DIAGNOSIS — C50919 Malignant neoplasm of unspecified site of unspecified female breast: Secondary | ICD-10-CM | POA: Diagnosis present

## 2016-01-20 DIAGNOSIS — R05 Cough: Secondary | ICD-10-CM | POA: Diagnosis not present

## 2016-01-20 DIAGNOSIS — N184 Chronic kidney disease, stage 4 (severe): Secondary | ICD-10-CM

## 2016-01-20 DIAGNOSIS — I2510 Atherosclerotic heart disease of native coronary artery without angina pectoris: Secondary | ICD-10-CM | POA: Diagnosis not present

## 2016-01-20 DIAGNOSIS — I83009 Varicose veins of unspecified lower extremity with ulcer of unspecified site: Secondary | ICD-10-CM

## 2016-01-20 DIAGNOSIS — C50911 Malignant neoplasm of unspecified site of right female breast: Secondary | ICD-10-CM

## 2016-01-20 DIAGNOSIS — Z5111 Encounter for antineoplastic chemotherapy: Secondary | ICD-10-CM | POA: Diagnosis not present

## 2016-01-20 DIAGNOSIS — J410 Simple chronic bronchitis: Secondary | ICD-10-CM

## 2016-01-20 DIAGNOSIS — E1122 Type 2 diabetes mellitus with diabetic chronic kidney disease: Secondary | ICD-10-CM

## 2016-01-20 DIAGNOSIS — C7951 Secondary malignant neoplasm of bone: Secondary | ICD-10-CM

## 2016-01-20 DIAGNOSIS — I2583 Coronary atherosclerosis due to lipid rich plaque: Secondary | ICD-10-CM

## 2016-01-20 DIAGNOSIS — L97909 Non-pressure chronic ulcer of unspecified part of unspecified lower leg with unspecified severity: Secondary | ICD-10-CM

## 2016-01-20 DIAGNOSIS — J069 Acute upper respiratory infection, unspecified: Secondary | ICD-10-CM

## 2016-01-20 DIAGNOSIS — D638 Anemia in other chronic diseases classified elsewhere: Secondary | ICD-10-CM

## 2016-01-20 LAB — CBC WITH DIFFERENTIAL/PLATELET
BASO%: 0.8 % (ref 0.0–2.0)
BASOS ABS: 0 10*3/uL (ref 0.0–0.1)
EOS ABS: 0 10*3/uL (ref 0.0–0.5)
EOS%: 1.3 % (ref 0.0–7.0)
HEMATOCRIT: 26.1 % — AB (ref 34.8–46.6)
HEMOGLOBIN: 8.5 g/dL — AB (ref 11.6–15.9)
LYMPH#: 0.4 10*3/uL — AB (ref 0.9–3.3)
LYMPH%: 13.4 % — ABNORMAL LOW (ref 14.0–49.7)
MCH: 28.8 pg (ref 25.1–34.0)
MCHC: 32.6 g/dL (ref 31.5–36.0)
MCV: 88.3 fL (ref 79.5–101.0)
MONO#: 0.2 10*3/uL (ref 0.1–0.9)
MONO%: 8.4 % (ref 0.0–14.0)
NEUT#: 2.1 10*3/uL (ref 1.5–6.5)
NEUT%: 76.1 % (ref 38.4–76.8)
Platelets: 217 10*3/uL (ref 145–400)
RBC: 2.96 10*6/uL — ABNORMAL LOW (ref 3.70–5.45)
RDW: 19.1 % — AB (ref 11.2–14.5)
WBC: 2.8 10*3/uL — ABNORMAL LOW (ref 3.9–10.3)

## 2016-01-20 LAB — COMPREHENSIVE METABOLIC PANEL
ALBUMIN: 3 g/dL — AB (ref 3.5–5.0)
ALK PHOS: 114 U/L (ref 40–150)
ALT: 26 U/L (ref 0–55)
ANION GAP: 11 meq/L (ref 3–11)
AST: 31 U/L (ref 5–34)
BUN: 33.8 mg/dL — ABNORMAL HIGH (ref 7.0–26.0)
CALCIUM: 9.7 mg/dL (ref 8.4–10.4)
CHLORIDE: 103 meq/L (ref 98–109)
CO2: 26 mEq/L (ref 22–29)
Creatinine: 1.8 mg/dL — ABNORMAL HIGH (ref 0.6–1.1)
EGFR: 29 mL/min/{1.73_m2} — AB (ref >90)
Glucose: 284 mg/dl — ABNORMAL HIGH (ref 70–140)
POTASSIUM: 3.6 meq/L (ref 3.5–5.1)
Sodium: 140 mEq/L (ref 136–145)
Total Bilirubin: 0.43 mg/dL (ref 0.20–1.20)
Total Protein: 6.7 g/dL (ref 6.4–8.3)

## 2016-01-20 LAB — TECHNOLOGIST REVIEW

## 2016-01-20 MED ORDER — DENOSUMAB 120 MG/1.7ML ~~LOC~~ SOLN
120.0000 mg | Freq: Once | SUBCUTANEOUS | Status: AC
  Administered 2016-01-20: 120 mg via SUBCUTANEOUS
  Filled 2016-01-20: qty 1.7

## 2016-01-20 MED ORDER — FULVESTRANT 250 MG/5ML IM SOLN
500.0000 mg | INTRAMUSCULAR | Status: DC
  Administered 2016-01-20: 500 mg via INTRAMUSCULAR
  Filled 2016-01-20: qty 10

## 2016-01-20 NOTE — Patient Instructions (Signed)
Denosumab injection What is this medicine? DENOSUMAB (den oh sue mab) slows bone breakdown. Prolia is used to treat osteoporosis in women after menopause and in men. Xgeva is used to prevent bone fractures and other bone problems caused by cancer bone metastases. Xgeva is also used to treat giant cell tumor of the bone. This medicine may be used for other purposes; ask your health care provider or pharmacist if you have questions. What should I tell my health care provider before I take this medicine? They need to know if you have any of these conditions: -dental disease -eczema -infection or history of infections -kidney disease or on dialysis -low blood calcium or vitamin D -malabsorption syndrome -scheduled to have surgery or tooth extraction -taking medicine that contains denosumab -thyroid or parathyroid disease -an unusual reaction to denosumab, other medicines, foods, dyes, or preservatives -pregnant or trying to get pregnant -breast-feeding How should I use this medicine? This medicine is for injection under the skin. It is given by a health care professional in a hospital or clinic setting. If you are getting Prolia, a special MedGuide will be given to you by the pharmacist with each prescription and refill. Be sure to read this information carefully each time. For Prolia, talk to your pediatrician regarding the use of this medicine in children. Special care may be needed. For Xgeva, talk to your pediatrician regarding the use of this medicine in children. While this drug may be prescribed for children as young as 13 years for selected conditions, precautions do apply. Overdosage: If you think you have taken too much of this medicine contact a poison control center or emergency room at once. NOTE: This medicine is only for you. Do not share this medicine with others. What if I miss a dose? It is important not to miss your dose. Call your doctor or health care professional if you are  unable to keep an appointment. What may interact with this medicine? Do not take this medicine with any of the following medications: -other medicines containing denosumab This medicine may also interact with the following medications: -medicines that suppress the immune system -medicines that treat cancer -steroid medicines like prednisone or cortisone This list may not describe all possible interactions. Give your health care provider a list of all the medicines, herbs, non-prescription drugs, or dietary supplements you use. Also tell them if you smoke, drink alcohol, or use illegal drugs. Some items may interact with your medicine. What should I watch for while using this medicine? Visit your doctor or health care professional for regular checks on your progress. Your doctor or health care professional may order blood tests and other tests to see how you are doing. Call your doctor or health care professional if you get a cold or other infection while receiving this medicine. Do not treat yourself. This medicine may decrease your body's ability to fight infection. You should make sure you get enough calcium and vitamin D while you are taking this medicine, unless your doctor tells you not to. Discuss the foods you eat and the vitamins you take with your health care professional. See your dentist regularly. Brush and floss your teeth as directed. Before you have any dental work done, tell your dentist you are receiving this medicine. Do not become pregnant while taking this medicine or for 5 months after stopping it. Women should inform their doctor if they wish to become pregnant or think they might be pregnant. There is a potential for serious side effects   to an unborn child. Talk to your health care professional or pharmacist for more information. What side effects may I notice from receiving this medicine? Side effects that you should report to your doctor or health care professional as soon as  possible: -allergic reactions like skin rash, itching or hives, swelling of the face, lips, or tongue -breathing problems -chest pain -fast, irregular heartbeat -feeling faint or lightheaded, falls -fever, chills, or any other sign of infection -muscle spasms, tightening, or twitches -numbness or tingling -skin blisters or bumps, or is dry, peels, or red -slow healing or unexplained pain in the mouth or jaw -unusual bleeding or bruising Side effects that usually do not require medical attention (Report these to your doctor or health care professional if they continue or are bothersome.): -muscle pain -stomach upset, gas This list may not describe all possible side effects. Call your doctor for medical advice about side effects. You may report side effects to FDA at 1-800-FDA-1088. Where should I keep my medicine? This medicine is only given in a clinic, doctor's office, or other health care setting and will not be stored at home. NOTE: This sheet is a summary. It may not cover all possible information. If you have questions about this medicine, talk to your doctor, pharmacist, or health care provider.    2016, Elsevier/Gold Standard. (2012-02-11 12:37:47) Fulvestrant injection What is this medicine? FULVESTRANT (ful VES trant) blocks the effects of estrogen. It is used to treat breast cancer. This medicine may be used for other purposes; ask your health care provider or pharmacist if you have questions. What should I tell my health care provider before I take this medicine? They need to know if you have any of these conditions: -bleeding problems -liver disease -low levels of platelets in the blood -an unusual or allergic reaction to fulvestrant, other medicines, foods, dyes, or preservatives -pregnant or trying to get pregnant -breast-feeding How should I use this medicine? This medicine is for injection into a muscle. It is usually given by a health care professional in a  hospital or clinic setting. Talk to your pediatrician regarding the use of this medicine in children. Special care may be needed. Overdosage: If you think you have taken too much of this medicine contact a poison control center or emergency room at once. NOTE: This medicine is only for you. Do not share this medicine with others. What if I miss a dose? It is important not to miss your dose. Call your doctor or health care professional if you are unable to keep an appointment. What may interact with this medicine? -medicines that treat or prevent blood clots like warfarin, enoxaparin, and dalteparin This list may not describe all possible interactions. Give your health care provider a list of all the medicines, herbs, non-prescription drugs, or dietary supplements you use. Also tell them if you smoke, drink alcohol, or use illegal drugs. Some items may interact with your medicine. What should I watch for while using this medicine? Your condition will be monitored carefully while you are receiving this medicine. You will need important blood work done while you are taking this medicine. Do not become pregnant while taking this medicine or for at least 1 year after stopping it. Women of child-bearing potential will need to have a negative pregnancy test before starting this medicine. Women should inform their doctor if they wish to become pregnant or think they might be pregnant. There is a potential for serious side effects to an unborn child. Men   should inform their doctors if they wish to father a child. This medicine may lower sperm counts. Talk to your health care professional or pharmacist for more information. Do not breast-feed an infant while taking this medicine or for 1 year after the last dose. What side effects may I notice from receiving this medicine? Side effects that you should report to your doctor or health care professional as soon as possible: -allergic reactions like skin rash,  itching or hives, swelling of the face, lips, or tongue -feeling faint or lightheaded, falls -pain, tingling, numbness, or weakness in the legs -signs and symptoms of infection like fever or chills; cough; flu-like symptoms; sore throat -vaginal bleeding Side effects that usually do not require medical attention (report to your doctor or health care professional if they continue or are bothersome): -aches, pains -constipation -diarrhea -headache -hot flashes -nausea, vomiting -pain at site where injected -stomach pain This list may not describe all possible side effects. Call your doctor for medical advice about side effects. You may report side effects to FDA at 1-800-FDA-1088. Where should I keep my medicine? This drug is given in a hospital or clinic and will not be stored at home. NOTE: This sheet is a summary. It may not cover all possible information. If you have questions about this medicine, talk to your doctor, pharmacist, or health care provider.    2016, Elsevier/Gold Standard. (2015-03-11 11:03:55)  

## 2016-01-20 NOTE — Telephone Encounter (Signed)
per pof to sch pt appt-gave pt copy of avs °

## 2016-01-20 NOTE — Progress Notes (Signed)
[place], DO [place] [address]  CHIEF COMPLAIN: follow up metastatic breast cancer     Breast cancer metastasized to bone ([place])   10/08/2012 Imaging CT chest, abdomen and pelvis showed extensive sclerotic lesions throughout the bones, 4.9 x 3.4 cm soft tissue versus hematoma anterior to the spleen. 6 mm groundglass nodule in the right upper lobe of lung.   10/10/2012 Initial Biopsy Right breast core needle biopsy showed invasive lobular carcinoma, grade 2. Left iliac crest bone biopsy showed metastatic carcinoma, morphology similar to breast biopsy.   10/14/2012 Initial Diagnosis Breast cancer metastasized to bone [facility])   11/2012 - 07/17/2015 Anti-estrogen oral therapy Letrozole 24m daily, add palbociclib on 01/01/2014, dose modified to 2 weeks on and 1 week off with cycle 3, stopped due to disease progression   02/02/2014 - 02/24/2014 Radiation Therapy Palliative radiation to left and the right femur, 30 gray in 10 fractions, by Dr. KSondra Come   09/01/2014 Imaging CT chest abdomen and pelvis showed several small pulmonary nodules are not changed. Stable widespread bone metastasis.   10/10/2014 Receptors her2 ER 100% positive, PR negative, HER-2 negative.   02/08/2015 Imaging PET scan showed a stable 8 mm right level III lymph node with SUV 8.8, no new neck adenopathy, stable diffuse osseous metastasis, stable for many nodules.   07/04/2015 Progression  PET scan showed overal dasease progression in the bones , slightly increased hypermetabolic activ of the lung nodules , similar appearance of hypermetaabolic rght level III lymph node.   07/18/2015 -  Anti-estrogen oral therapy  fulvestrant 500 mg every 4 weeks ( loading dose every  weeksX2),  and Ibrance 1017mdaily,  3 week on, 1 week off   11/04/2015 Imaging PET scan showed interval response to therapy, especially bone metastasis. Right level III cervical node and right upper  lobe pulmonary nodule also demonstrate decreased hypermetabolism.     CURRENT THERAPY:   1. She started letrozole in 11/2012.  Added Palbociclib 125 mg daily for 21 days on and 7 days off on 01/01/2014 with disease progression. Dose modified to 2 weeks on 1 week off with cycle 3.  Letrozole changed to fulvestrant on 07/18/2015 due to disease progress, and Ibrance changed to 1008maily, 3 weeks on and one week off.  2. zometa added on 01/01/2014 for metastatic bone disease. Zometa held due to renal insufficiency and [facility] disease progression and safe renal profile.   INTERVAL HISTORY:  Anita [name] 70o. female with a history of right breast cancer with metastases to the bone/lungs here for follow-up.  She complains of persistent cough with small thick sputum production for the past ew weeks, no chest pain or fever, mild dyspnea on exertion is stable. Her left hip pain has resolved, no other new pains. She was recently discharged from wound clinic, her wounds have most resolved, except multiple scratches on her legs.    MEDICAL HISTORY: Past Medical History  Diagnosis Date  . Fibroid   . Morbid [facility] ([facility] . 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 COPD (chronic obstructive pulmonary disease) ([place] . Chronic diastolic CHF (congestive heart failure) ([facility] . [facility]ST elevated [facility]   Cath November 2014 LAD 40% stenosis, first diagonal 80% stenosis, circumflex 20% stenosis, right coronary artery occluded. The  EF was 35-40% at that time.  . Anemia     a. Adm 09/2012 with melena, Hgb 5.8 -> transfused. EGD/colonoscopy unrevealing.  . Breast cancer ([place])     a. Mets to bone. ER 100%/ PR 0%/Her2 neu negative.  [name] Ulcers of both lower legs ([place])   . Tobacco abuse   . Hyperlipidemia 02/11/2013  . Chronic respiratory failure ([place])     a. On O2 qhs. also portable O2.  . Chronic renal  insufficiency   . Lesion of vocal cord     a. CT 05/2013 concerning for tumor.  . Shortness of breath   . Depression   . Diabetes mellitus without complication ([place])   . Anginal pain ([place])   . Dementia     very mild  . Radiation 02/02/14-02/24/14    left and right femur 30 gray    ALLERGIES:  is allergic to omnipaque and benzene.  MEDICATIONS:    Medication List       This list is accurate as of: 01/20/16  2:05 PM.  Always use your most recent med list.               albuterol 108 (90 Base) MCG/ACT inhaler  Commonly known as:  PROVENTIL HFA;VENTOLIN HFA  Inhale 2 puffs into the lungs every 6 (six) hours as needed for wheezing or shortness of breath.     aspirin 81 MG EC tablet  Take 1 tablet (81 mg total) by mouth daily.     atorvastatin 40 MG tablet  Commonly known as:  LIPITOR  TAKE 1 TABLET EVERY DAY     bag balm Oint ointment  Apply 1 g topically as needed for dry skin or irritation.     clotrimazole 1 % cream  Commonly known as:  LOTRIMIN  Apply 1 application topically daily.     docusate sodium 100 MG capsule  Commonly known as:  COLACE  Take 100 mg by mouth daily.     FASLODEX 250 MG/5ML injection  Generic drug:  fulvestrant  Inject 250 mg into the muscle once. One injection each buttock over 1-2 minutes. Warm prior to use.     isosorbide mononitrate 60 MG 24 hr tablet  Commonly known as:  IMDUR  Take 1 tablet (60 mg total) by mouth daily.     mirtazapine 15 MG tablet  Commonly known as:  REMERON  Take 1 tablet (15 mg total) by mouth at bedtime.     nitroGLYCERIN 0.4 MG SL tablet  Commonly known as:  NITROSTAT  Place 1 tablet (0.4 mg total) under the tongue every 5 (five) minutes as needed for chest pain.     NON FORMULARY  Place 3 L into the nose as needed (oxygen).     palbociclib 100 MG capsule  Commonly known as:  IBRANCE  Take 1 capsule (100 mg total) by mouth daily with breakfast. Take whole with food.     potassium chloride SA 20 MEQ  tablet  Commonly known as:  K-DUR,KLOR-CON  Take 2 tablets (40 mEq total) by mouth 2 (two) times daily.     torsemide 20 MG tablet  Commonly known as:  DEMADEX  Take 1-4 tablets (20-80 mg total) by mouth See admin instructions.     VITAMIN C PO  Take 500 mg by mouth daily.     VITAMIN D (CHOLECALCIFEROL) PO  Take 1,000 mg by mouth daily.     XGEVA 120 MG/1.7ML Soln injection  Generic drug:  denosumab  Inject 120 mg into the skin once. Monthly at [facility]        SURGICAL HISTORY:  Past Surgical History  Procedure Laterality Date  . Colonoscopy, esophagogastroduodenoscopy (egd) and esophageal dilation N/A 10/13/2012    Procedure: colonoscopy and egd;  Surgeon: Wonda Horner, MD;  Location: [place];  Service: Endoscopy;  Laterality: N/A;  . Left heart catheterization with coronary angiogram N/A 07/07/2013    Procedure: LEFT HEART CATHETERIZATION WITH CORONARY ANGIOGRAM;  Surgeon: Peter M Martinique, MD;  Location: [facility] CATH LAB;  Service: Cardiovascular;  Laterality: N/A;   REVIEW OF SYSTEMS:   Constitutional: Denies fevers, chills or abnormal weight loss Eyes: Denies blurriness of vision Ears, nose, mouth, throat, and face: Denies mucositis or sore throat Respiratory: Denies cough, (+) stable dyspnea, no wheezes Cardiovascular: Denies palpitation, chest discomfort or lower extremity swelling Gastrointestinal:  Denies nausea, heartburn or change in bowel habits Skin: Denies abnormal skin rashes Lymphatics: Denies new lymphadenopathy or easy bruising Neurological:Denies numbness, tingling or new weaknesses Behavioral/Psych: Mood is stable, no new changes  All other systems were reviewed with the patient and are negative.  PHYSICAL EXAMINATION: ECOG PERFORMANCE STATUS: 3 Blood pressure 126/55, pulse 103, temperature 98.2 F (36.8 C), temperature source Oral, resp. rate 18, height _0  (1.575 m), weight 220 lb 6.4 oz (99.973 kg), SpO2 95 %. GENERAL:alert, no distress and  comfortable morbidly obese woman in wheelchair, chronically ill appearing SKIN: skin color, texture, turgor are normal; multiple skin scratches on her legs  EYES: normal, Conjunctiva are pink and non-injected, sclera clear OROPHARYNX:no exudate, no erythema and lips, buccal mucosa, and tongue normal  NECK: supple, thyroid normal size, non-tender, without nodularity LYMPH:  no palpable lymphadenopathy in the cervical, axillary LUNGS: coarse breathing with slightly increased breathing rate, (+) crackers on bilateral bases HEART: regular rate & rhythm and no murmurs and 2+ lower extremity edema BREAST: deferred ABDOMEN:abdomen soft, non-tender and normal bowel sounds; obese Musculoskeletal:no cyanosis of digits and no clubbing; right lower extremities are wrapped with gauze up to knees. Left leg exam showed pitting edema to knee, mild diffuse skin erythema above the ankle, scratch markers, no open wound. Skin temperature is normal. NEURO: alert & oriented x 3 with fluent speech, no focal motor/sensory deficits  LABORATORY DATA: CBC Latest Ref Rng 01/20/2016 12/30/2015 12/23/2015  WBC 3.9 - 10.3 10e3/uL 2.8(L) 2.4(L) 2.1(L)  Hemoglobin 11.6 - 15.9 g/dL 8.5(L) 8.4(L) 8.4(L)  Hematocrit 34.8 - 46.6 % 26.1(L) 26.0(L) 25.9(L)  Platelets 145 - 400 10e3/uL 217 131(L) 175    CMP Latest Ref Rng 01/20/2016 12/30/2015 12/23/2015  Glucose 70 - 140 mg/dl 284(H) 170(H) 262(H)  BUN 7.0 - 26.0 mg/dL 33.8(H) 32.4(H) 35.4(H)  Creatinine 0.6 - 1.1 mg/dL 1.8(H) 1.7(H) 2.1(H)  Sodium 136 - 145 mEq/L 140 138 139  Potassium 3.5 - 5.1 mEq/L 3.6 3.8 3.6  CO2 22 - 29 mEq/L _1 Calcium 8.4 - 10.4 mg/dL 9.7 8.9 9.2  Total Protein 6.4 - 8.3 g/dL 6.7 6.7 6.5  Total Bilirubin 0.20 - 1.20 mg/dL 0.43 0.45 0.40  Alkaline Phos 40 - 150 U/L 114 116 108  AST 5 - 34 U/L 31 66(H) 41(H)  ALT 0 - 55 U/L 26 45 36   ANC 2.1  CA 27.29    PATHOLOGY REPORT  Diagnosis 10/10/2014 1. Bone, biopsy, left iliac crest - METASTATIC  MAMMARY CARCINOMA, SEE COMMENT. 2. Breast, right, needle core biopsy - INVASIVE MAMMARY CARCINOMA, SEE COMMENT. Microscopic Comment 1. and 2. The breast  biopsy demonstrates definitive morphologic features of invasive mammary carcinoma. Although the grade of tumor is best assessed at resection, with these biopsies, the carcinoma is grade II. An E-Cadherin stain is pending and will be reported as an addendum. The morphology of the metastatic carcinoma in part 2 is identical to the breast biopsy. Breast prognostic studies are pending (part 2) and will be reported as an addendum. The case was reviewed with Dr. Avis Epley who concurs. The case was discussed with Dr. Lamonte Sakai on 10/13/2012.  RADIOGRAPHIC STUDIES:  CT chest abdomen and pelvis 09/01/2014 IMPRESSION: Chest Impression:  1. Stable exam of thorax. 2. Several small smudgy pulmonary nodules are not changed. 3. Stable widespread bony metastasis pneumothorax.  Abdomen / Pelvis Impression:  1. Stable small periaortic retroperitoneal lymph node. 2. No evidence disease progression. 3. Stable widespread skeletal metastasis. 4. Left sided staghorn calculus again demonstrated. 5. Nodular focus in the right kidney at site of prior renal cyst is not changed  PET 11/04/2015 IMPRESSION: 1. Primarily response to therapy. 2. Right level 3 node is similar in size and slightly decreased in hypermetabolism. Index right upper lobe pulmonary nodule is enlarged minimally but demonstrates decreased hypermetabolism. 3. Improvement in widespread osseous metastasis. No new sites of disease identified. 4. Atherosclerosis, including within the coronary arteries. 5. Pulmonary artery enlargement suggests pulmonary arterial hypertension. 6. Hepatic steatosis and hepatomegaly. 7. Chronic stone filled left renal pelvis.    ASSESSMENT: 70 year old Caucasian female  PLAN:  1.  Metastatic breast cancer (ER pos/PR neg/ HER2 neg) with metastasis to bones  and lungs, possible right cervical nodes.  -I reviewed her restaging PET scan from 07/04/2015 with pt in [place], which showed slight disease progression in the bones , lung metastasis are stable in size, but the largest lesion has increased FDG uptake.  She also noticed some new right-sided chest wall pain, possibly related to bone metastasis.  -Given the mild disease progression, I have changed her antiestrogen therapy from letrozole to fulvestrant injection, and continue Ibrance, but changed her Ibrance from 125 mg 2 weeks on, one week off, to 100 mg, 3 weeks on, 1 week off. -restaging PET scan from 11/04/2015 showed good response to bone metastasis, and a stable pulmonary metastasis. No other new lesions. -she is tolerating treatment well, will continue - lab reviewed with her,stable anemia, mild leukopenia from Ibrance, ANC 2.1 -continue current therapy  2. bone metastasis - continue Xgeva monthly  -Continue calcium and vitamin D  3. Anemia of chronic disease (CKD and malignancy), and anemia second to Ibrance -- Past work up for reversible causes of anemia were negative.  -Repeated ferritin 185, serum iron 44 (normal) and saturation 16% on 09/15/2015  -She is not taking iron pill, could not tolerate.  -I do not recommend ESA, due to her underline malignancy  -anemia improved after blood transfusion on 09/17/2015, but not much after 1U RBC in 11/2015 -continue monitoring.   4. Productive cough -CXR today, if negative, will hold on antibiotics   5. CHF -she has slightly worsening dyspnea, bilateral lung rales on exam, probable pulmonary edema. -I encouraged her to call her cardiologist for follow-up.  6. Diabetes mellitus, type II.  -not well not medication, diet controlled most of time  7. COPD: On albuterol prn. She remains on home oxygen.  8. Chronic renal insufficiency, stage IV   -Stable. -she will follow-up with Dr. Florene Glen  9: Bilateral leg venous stasis ulcers -She was  recently discharged from wound clinic  -follow up with PCP  10. Mild leukopenia and thrombocytopenia -Secondary to [facility] continue monitoring.  All questions were answered. The patient knows to call the clinic with any problems, questions or concerns. We can certainly see the patient much sooner if necessary.  Plan -CXR today, if negative, no antibiotics for now  -She'll continue Ibrance  -continue fulvestrant 540m and Xgeva monthly, injections today                         -I'll see her back in 4 weeks with lab, including CBC, CMP  -I encouraged her to call me if her cough gets worse     I spent 25 minutes counseling the patient face to face. The total time spent in the appointment was 30 minutes.   FTruitt Merle 01/20/2016

## 2016-01-24 ENCOUNTER — Telehealth: Payer: Self-pay | Admitting: *Deleted

## 2016-01-24 NOTE — Telephone Encounter (Signed)
FYI "I had CXR last week, are the results in.  Pneumonia?"  Advised no cardiopulmonary disease, no pneumonia.

## 2016-01-25 ENCOUNTER — Telehealth: Payer: Self-pay | Admitting: Family Medicine

## 2016-01-25 NOTE — Telephone Encounter (Signed)
**  After Hours/ Emergency Line Call*  Received a call to report that Ralene Cork has a fever to 102F.  Loletta Parish, daughter, reports that patient is being treated with chemo pill by Dr Burr Medico for metastatic breast cancer.  Patient has been experiencing a "cold" for a week or two.  She was seen by oncology and had a chest xray performed that was negative for pna on 5/26.  Did not have fever at that time.  Notes that first fever was not until tonight.  Has not taken any medications for fever and is calling to see what she can take.  Endorsing cough, congestion, fever.  Denying hemoptysis, chills, myalgia, nausea, vomiting, diarrhea, dysuria, hematuria, SOB outside of normal, CP.  Recommended that she be seen in ED in the setting of current chemo treatment.  Patient declines ED evaluation.  Recommended early SDA appt tomorrow 6/1.  Patient declines and wants a Friday appt.  Discussed that I recommend more urgent evaluation, as she is highly susceptible to infection given immunosuppression on chemotrx.  She is also at risk for neutropenic fever which can be life threatening.  She and daughter voice good understanding of these risks and do not wish to pursue emergent evaluation in ED.  Recommended that at minimum she call her oncologist ASAP for instruction.  I suspect she is going to recommend evaluation in ED.    BLAZE DOMITROVICH is a 70 y.o. female that is calling re: temp 102F.  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   Last WBC reviewed 2.8 on 01/20/16.  Concern for possible neutropenic fever vs bacterial infection.  Possibly a virus but cannout rule out significant infection over the phone.  LFTs WNL on 5/26 CMP.  Ok to take Tylenol for fever, but again noted that this may only mask signs and symptoms of infection.  Red flags discussed.  Will forward to PCP, SDA provider and oncologist.  Patient has appt scheduled 6/2 at 11am with Dr Raeford Razor.  Ashly M. Lajuana Ripple, DO PGY-2, [place]

## 2016-01-26 NOTE — Telephone Encounter (Signed)
I tried her home and cell phone but no answer. I left a message, and asked her to call back to update how she is doing at home. We can see her tomorrow if she is able to come in.   Thu, please give her a call tomorrow for follow up, thanks.  Truitt Merle  01/26/2016

## 2016-01-27 ENCOUNTER — Ambulatory Visit (INDEPENDENT_AMBULATORY_CARE_PROVIDER_SITE_OTHER): Payer: Commercial Managed Care - HMO | Admitting: Internal Medicine

## 2016-01-27 ENCOUNTER — Telehealth: Payer: Self-pay | Admitting: *Deleted

## 2016-01-27 ENCOUNTER — Encounter: Payer: Self-pay | Admitting: Internal Medicine

## 2016-01-27 VITALS — BP 123/44 | HR 96 | Temp 98.5°F | Wt 205.4 lb

## 2016-01-27 DIAGNOSIS — J441 Chronic obstructive pulmonary disease with (acute) exacerbation: Secondary | ICD-10-CM | POA: Diagnosis not present

## 2016-01-27 MED ORDER — PREDNISONE 20 MG PO TABS: 40.0000 mg | ORAL_TABLET | Freq: Every day | ORAL | Status: DC

## 2016-01-27 MED ORDER — LEVOFLOXACIN 500 MG PO TABS: 500.0000 mg | ORAL_TABLET | Freq: Every day | ORAL | Status: DC

## 2016-01-27 NOTE — Telephone Encounter (Signed)
Called pt at home and was informed that pt was feeling a little better.  Stated that pt has appt with her PCP Dr. Tamala Julian today.  Pt hung up phone before conversation ended.

## 2016-01-27 NOTE — Telephone Encounter (Signed)
TC from patient asking if it is ok for her to take new prescription for levaquin and prednisone while on IBrance.Pt has seen PCP and has possible pneumonia and COPD exacerbation. Advised to take unless she hears back from [place]. Pt voiced understanding.

## 2016-01-27 NOTE — Assessment & Plan Note (Signed)
Given cough, increased dyspnea, and O2 sat on 91% in office, patient likely with COPD exacerbation. Crackles heard on lung exam may be 2/2 PNA, however patient had CXR performed last week that was clear.  - Prednisone 40mg  qd x5d - Levaquin x7d (given possibility of pneumonia as well as patient at high risk of hospitalization) - Instructed to go to ED if febrile or symptoms worsen - F/u on Monday

## 2016-01-27 NOTE — Progress Notes (Signed)
   Subjective:    Patient ID: Anita [name], female    DOB: 1946/06/20, 70 y.o.   MRN: [id number]  HPI  Anita [name] is a 70 yo F with PMH of COPD and metastatic breast cancer currently undergoing radiation presenting with cough and fever.   Patient reports that for the past few weeks she has had a productive cough. She noticed blood in her sputum twice last week, but none since then. She also endorses rhinorrhea. Two days ago, she had a fever of 102.43F. She called the after hours line and was encouraged to go to the ED at that time, but chose not to. Patient took Tylenol instead and her fever subsided. She denies any subsequent fevers. Denies chills, nausea, or vomiting.  Patient's daughter reports that she has also been breathing more shallow and has been more short of breath than usual. Patient is on 4L supplemental O2 at baseline, but daughter says that she becomes very out of breath after walking only a few steps recently.  Patient also with 7 pound weight loss noted since oncology appt last week. She says that her appetite has been normal. She has actually been drinking more recently since she has been sick to keep herself hydrated. She denies increased urinary frequency. She has been taking her torsemide (80mg  in AM, 40mg  in PM) as prescribed.   Review of Systems See HPI.     Objective:   Physical Exam  Constitutional: She is oriented to person, place, and time. No distress.  Chronically ill-appearing female sitting in wheelchair, accompanied by daughter  HENT:  Head: Normocephalic and atraumatic.  Cardiovascular: Normal rate, regular rhythm and normal heart sounds.   No murmur heard. Pulmonary/Chest:  On 4L [place]; crackles present on R primarily in lower lobes, no wheezing  Abdominal: Soft. Bowel sounds are normal. She exhibits no distension. There is no tenderness.  Neurological: She is alert and oriented to person, place, and time.  Psychiatric: She has a normal mood and affect. Her  behavior is normal.  Vitals reviewed.     Assessment & Plan:  COPD exacerbation ([place]) Given cough, increased dyspnea, and O2 sat on 91% in office, patient likely with COPD exacerbation. Crackles heard on lung exam may be 2/2 PNA, however patient had CXR performed last week that was clear.  - Prednisone 40mg  qd x5d - Levaquin x7d (given possibility of pneumonia as well as patient at high risk of hospitalization) - Instructed to go to ED if febrile or symptoms worsen - F/u on Monday    Adin Hector, MD PGY-1 [place] Pager [phone]

## 2016-01-27 NOTE — Telephone Encounter (Signed)
That should be fine, thanks.   Anita [name]  01/27/2016

## 2016-01-27 NOTE — Patient Instructions (Addendum)
It was nice meeting you today Anita [name].   Please take levaquin (antibiotic) 500 mg once a day for the next 7 days. Also, please take prednisone 40mg  (two tablets) with breakfast for the next 5 days.   If your symptoms worsen or you have another fever, please go to the emergency room right away.   I will see you back on Monday to make sure you are improving.   Be well,  Dr. Avon Gully

## 2016-01-28 ENCOUNTER — Telehealth: Payer: Self-pay | Admitting: Student

## 2016-01-28 MED ORDER — LEVOFLOXACIN 500 MG PO TABS: 500.0000 mg | ORAL_TABLET | Freq: Every day | ORAL | Status: DC

## 2016-01-28 NOTE — Telephone Encounter (Signed)
Called to request levoquin to be sent to [facility]. It was sent to her pharmacy delivery service. Will send to [facility] as requested  Caprisha Bridgett A. Lincoln Brigham MD, [facility] Resident PGY-2 Pager [phone]

## 2016-01-30 ENCOUNTER — Encounter: Payer: Self-pay | Admitting: Family Medicine

## 2016-01-30 ENCOUNTER — Ambulatory Visit (INDEPENDENT_AMBULATORY_CARE_PROVIDER_SITE_OTHER): Payer: Commercial Managed Care - HMO | Admitting: Family Medicine

## 2016-01-30 ENCOUNTER — Ambulatory Visit: Payer: Commercial Managed Care - HMO | Admitting: Family Medicine

## 2016-01-30 VITALS — BP 124/55 | HR 93 | Temp 97.5°F | Ht 62.0 in | Wt 209.8 lb

## 2016-01-30 DIAGNOSIS — J410 Simple chronic bronchitis: Secondary | ICD-10-CM | POA: Diagnosis not present

## 2016-01-30 DIAGNOSIS — J441 Chronic obstructive pulmonary disease with (acute) exacerbation: Secondary | ICD-10-CM | POA: Diagnosis not present

## 2016-01-30 NOTE — Assessment & Plan Note (Signed)
Improving acute exacerbation of COPD (initial mild-mod) with persistent productive cough. On prednisone (has not started antibiotic) - No hypoxia (95% on RA not wearing O2 but has with her), afebrile  Plan: 1. Start Levaquin as previously rx'd given persistent productive cough / Finish Prednisone burst 2. Use albuterol q 4 hr regularly x 2-3 days. Continue maintenance inhalers 3. Return criteria given, alrdy has follow-up with PCP

## 2016-01-30 NOTE — Patient Instructions (Signed)
Thank you for coming in to clinic today.  1. I'm glad to hear that you are starting to feel better. Finish the antibiotic and prednisone as previously prescribed. 2. Use the supplemental oxygen as needed  If your breathing gets worse with coughing, difficulty breathing even at rest, swelling in legs worsening or elevated fluid level, please return sooner otherwise if more urgent then may go to ED for immediate medical attention  Follow-up with PCP on 02/09/16 as scheduled  If you have any other questions or concerns, please feel free to call the clinic to contact me. You may also schedule an earlier appointment if necessary.  However, if your symptoms get significantly worse, please go to the Emergency Department to seek immediate medical attention.  Nobie Putnam, [place]

## 2016-01-30 NOTE — Progress Notes (Signed)
Subjective:    Patient ID: Anita [name], female    DOB: 1946-06-09, 70 y.o.   MRN: [id number]  Anita [name] is a 70 y.o. female presenting on 01/30/2016 for Follow-up   HPI   COUGH / CONGESTION: - Last seen at [facility] for office visit on 6/2 with dx COPD vs PNA given 1-2 week of productive cough and URI symptoms, had fever with Tmax 102.41F at that time and dyspnea despite 4L O2. Started on Levaquin 500mg  x 7 days and Prednisone x 5 day burst. Due to issues with rx, has not picked up Levaquin yet (initially sent to mail order pharmacy), she has taken 3-4 days of prednisone and will start Levaquin tonight. - Today reports symptoms gradually improved but still productive thick mucus with cough. Not needing O2 right now, uses supplemental 4L O2 only with excessive ambulation and activity, not always overnight - On chemotherapy for breast cancer (no doses this week) - Denies any further fevers/chills/sweats, nausea, vomiting, CP, worsening dyspnea, LE edema (admits to some chronic edema)   Social History  Substance Use Topics  . Smoking status: Former Smoker -- 2.00 packs/day for 50 years    Types: Cigarettes    Start date: 08/27/1961    Quit date: 09/24/2012  . Smokeless tobacco: Never Used     Comment: Quit when COPD dx  . Alcohol Use: No    Review of Systems Per HPI unless specifically indicated above     Objective:    BP 124/55 mmHg  Pulse 93  Temp(Src) 97.5 F (36.4 C) (Oral)  Ht 5\' 2"  (1.575 m)  Wt 209 lb 12.8 oz (95.165 kg)  BMI 38.36 kg/m2  SpO2 95%  Wt Readings from Last 3 Encounters:  01/30/16 209 lb 12.8 oz (95.165 kg)  01/27/16 205 lb 6.4 oz (93.169 kg)  01/20/16 220 lb 6.4 oz (99.973 kg)    Physical Exam  Constitutional: She appears well-developed and well-nourished. No distress.  Chronically ill appearing, comfortable, cooperative  HENT:  Head: Normocephalic and atraumatic.  Mouth/Throat: Oropharynx is clear and moist.  No supplemental O2 on    Cardiovascular: Normal rate, regular rhythm, normal heart sounds and intact distal pulses.   No murmur heard. Pulmonary/Chest: Effort normal. No respiratory distress. She has no wheezes.  Mild crackles Right lower base, overall improved from prior. Some persistent reduced air movement. Speaks full sentences.  Abdominal: Soft.  Musculoskeletal: She exhibits edema (stable bilateral LE edema R>L some woody edema with +1 pitting without change, non-tender).  Neurological: She is alert.  Skin: Skin is warm and dry. She is not diaphoretic.  Nursing note and vitals reviewed.       Assessment & Plan:   Problem List Items Addressed This Visit    COPD exacerbation ([place]) - Primary    Improving acute exacerbation of COPD (initial mild-mod) with persistent productive cough. On prednisone (has not started antibiotic) - No hypoxia (95% on RA not wearing O2 but has with her), afebrile  Plan: 1. Start Levaquin as previously rx'd given persistent productive cough / Finish Prednisone burst 2. Use albuterol q 4 hr regularly x 2-3 days. Continue maintenance inhalers 3. Return criteria given, alrdy has follow-up with PCP       COPD (chronic obstructive pulmonary disease) ([place]) (Chronic)    Improving AECOPD. Likely primary underlying etiology for dyspnea, with questionable PNA vs initial moderate exac requiring antibiotics. Diff dx unlikely acute CHF without worsening edema also productive cough.  No orders of the defined types were placed in this encounter.      Follow up plan: Return in about 10 days (around 02/09/2016) for PCP f/u.  Nobie Putnam, [place], PGY-3

## 2016-01-30 NOTE — Assessment & Plan Note (Addendum)
Improving AECOPD. Likely primary underlying etiology for dyspnea, with questionable PNA vs initial moderate exac requiring antibiotics. Diff dx unlikely acute CHF without worsening edema also productive cough.

## 2016-02-01 ENCOUNTER — Other Ambulatory Visit: Payer: Self-pay

## 2016-02-01 ENCOUNTER — Emergency Department (HOSPITAL_COMMUNITY): Payer: Commercial Managed Care - HMO

## 2016-02-01 ENCOUNTER — Inpatient Hospital Stay (HOSPITAL_COMMUNITY)
Admission: EM | Admit: 2016-02-01 | Discharge: 2016-02-03 | DRG: 303 | Disposition: A | Discharge status: Home / Self Care | Payer: Commercial Managed Care - HMO | Attending: Family Medicine | Admitting: Family Medicine

## 2016-02-01 ENCOUNTER — Encounter (HOSPITAL_COMMUNITY): Payer: Self-pay | Admitting: General Practice

## 2016-02-01 DIAGNOSIS — I25119 Atherosclerotic heart disease of native coronary artery with unspecified angina pectoris: Principal | ICD-10-CM | POA: Diagnosis present

## 2016-02-01 DIAGNOSIS — I878 Other specified disorders of veins: Secondary | ICD-10-CM | POA: Diagnosis present

## 2016-02-01 DIAGNOSIS — E0590 Thyrotoxicosis, unspecified without thyrotoxic crisis or storm: Secondary | ICD-10-CM | POA: Diagnosis present

## 2016-02-01 DIAGNOSIS — Z91048 Other nonmedicinal substance allergy status: Secondary | ICD-10-CM

## 2016-02-01 DIAGNOSIS — E785 Hyperlipidemia, unspecified: Secondary | ICD-10-CM | POA: Diagnosis present

## 2016-02-01 DIAGNOSIS — Z91041 Radiographic dye allergy status: Secondary | ICD-10-CM

## 2016-02-01 DIAGNOSIS — Z833 Family history of diabetes mellitus: Secondary | ICD-10-CM

## 2016-02-01 DIAGNOSIS — R7989 Other specified abnormal findings of blood chemistry: Secondary | ICD-10-CM | POA: Diagnosis not present

## 2016-02-01 DIAGNOSIS — I5032 Chronic diastolic (congestive) heart failure: Secondary | ICD-10-CM | POA: Diagnosis present

## 2016-02-01 DIAGNOSIS — R0789 Other chest pain: Secondary | ICD-10-CM | POA: Diagnosis present

## 2016-02-01 DIAGNOSIS — J9610 Chronic respiratory failure, unspecified whether with hypoxia or hypercapnia: Secondary | ICD-10-CM | POA: Diagnosis present

## 2016-02-01 DIAGNOSIS — Z6839 Body mass index (BMI) 39.0-39.9, adult: Secondary | ICD-10-CM | POA: Diagnosis not present

## 2016-02-01 DIAGNOSIS — Z7982 Long term (current) use of aspirin: Secondary | ICD-10-CM

## 2016-02-01 DIAGNOSIS — I130 Hypertensive heart and chronic kidney disease with heart failure and stage 1 through stage 4 chronic kidney disease, or unspecified chronic kidney disease: Secondary | ICD-10-CM | POA: Diagnosis present

## 2016-02-01 DIAGNOSIS — F329 Major depressive disorder, single episode, unspecified: Secondary | ICD-10-CM | POA: Diagnosis present

## 2016-02-01 DIAGNOSIS — D638 Anemia in other chronic diseases classified elsewhere: Secondary | ICD-10-CM | POA: Diagnosis present

## 2016-02-01 DIAGNOSIS — M25562 Pain in left knee: Secondary | ICD-10-CM | POA: Diagnosis not present

## 2016-02-01 DIAGNOSIS — I209 Angina pectoris, unspecified: Secondary | ICD-10-CM

## 2016-02-01 DIAGNOSIS — E669 Obesity, unspecified: Secondary | ICD-10-CM | POA: Diagnosis present

## 2016-02-01 DIAGNOSIS — I5033 Acute on chronic diastolic (congestive) heart failure: Secondary | ICD-10-CM | POA: Diagnosis not present

## 2016-02-01 DIAGNOSIS — C50919 Malignant neoplasm of unspecified site of unspecified female breast: Secondary | ICD-10-CM | POA: Diagnosis present

## 2016-02-01 DIAGNOSIS — Z87891 Personal history of nicotine dependence: Secondary | ICD-10-CM | POA: Diagnosis not present

## 2016-02-01 DIAGNOSIS — C7951 Secondary malignant neoplasm of bone: Secondary | ICD-10-CM | POA: Diagnosis present

## 2016-02-01 DIAGNOSIS — N184 Chronic kidney disease, stage 4 (severe): Secondary | ICD-10-CM | POA: Diagnosis present

## 2016-02-01 DIAGNOSIS — Z853 Personal history of malignant neoplasm of breast: Secondary | ICD-10-CM

## 2016-02-01 DIAGNOSIS — I214 Non-ST elevation (NSTEMI) myocardial infarction: Secondary | ICD-10-CM

## 2016-02-01 DIAGNOSIS — I509 Heart failure, unspecified: Secondary | ICD-10-CM

## 2016-02-01 DIAGNOSIS — Z86001 Personal history of in-situ neoplasm of cervix uteri: Secondary | ICD-10-CM | POA: Diagnosis not present

## 2016-02-01 DIAGNOSIS — J410 Simple chronic bronchitis: Secondary | ICD-10-CM

## 2016-02-01 DIAGNOSIS — R778 Other specified abnormalities of plasma proteins: Secondary | ICD-10-CM | POA: Insufficient documentation

## 2016-02-01 DIAGNOSIS — R079 Chest pain, unspecified: Secondary | ICD-10-CM

## 2016-02-01 DIAGNOSIS — J441 Chronic obstructive pulmonary disease with (acute) exacerbation: Secondary | ICD-10-CM

## 2016-02-01 DIAGNOSIS — Z79899 Other long term (current) drug therapy: Secondary | ICD-10-CM

## 2016-02-01 DIAGNOSIS — I252 Old myocardial infarction: Secondary | ICD-10-CM | POA: Diagnosis not present

## 2016-02-01 DIAGNOSIS — Z7952 Long term (current) use of systemic steroids: Secondary | ICD-10-CM

## 2016-02-01 DIAGNOSIS — J449 Chronic obstructive pulmonary disease, unspecified: Secondary | ICD-10-CM | POA: Diagnosis present

## 2016-02-01 DIAGNOSIS — Z8249 Family history of ischemic heart disease and other diseases of the circulatory system: Secondary | ICD-10-CM

## 2016-02-01 DIAGNOSIS — I5043 Acute on chronic combined systolic (congestive) and diastolic (congestive) heart failure: Secondary | ICD-10-CM | POA: Diagnosis not present

## 2016-02-01 DIAGNOSIS — F0390 Unspecified dementia without behavioral disturbance: Secondary | ICD-10-CM | POA: Diagnosis present

## 2016-02-01 DIAGNOSIS — D649 Anemia, unspecified: Secondary | ICD-10-CM

## 2016-02-01 DIAGNOSIS — E1122 Type 2 diabetes mellitus with diabetic chronic kidney disease: Secondary | ICD-10-CM | POA: Diagnosis present

## 2016-02-01 DIAGNOSIS — C7800 Secondary malignant neoplasm of unspecified lung: Secondary | ICD-10-CM | POA: Diagnosis present

## 2016-02-01 DIAGNOSIS — E119 Type 2 diabetes mellitus without complications: Secondary | ICD-10-CM

## 2016-02-01 DIAGNOSIS — J9611 Chronic respiratory failure with hypoxia: Secondary | ICD-10-CM | POA: Diagnosis not present

## 2016-02-01 DIAGNOSIS — I119 Hypertensive heart disease without heart failure: Secondary | ICD-10-CM

## 2016-02-01 DIAGNOSIS — M25569 Pain in unspecified knee: Secondary | ICD-10-CM

## 2016-02-01 HISTORY — DX: Atherosclerotic heart disease of native coronary artery without angina pectoris: I25.10

## 2016-02-01 HISTORY — DX: Anemia in other chronic diseases classified elsewhere: D63.8

## 2016-02-01 HISTORY — DX: Personal history of nicotine dependence: Z87.891

## 2016-02-01 HISTORY — DX: Chronic kidney disease, stage 4 (severe): N18.4

## 2016-02-01 LAB — CBC
HEMATOCRIT: 24.6 % — AB (ref 36.0–46.0)
Hemoglobin: 7.6 g/dL — ABNORMAL LOW (ref 12.0–15.0)
MCH: 27.6 pg (ref 26.0–34.0)
MCHC: 30.9 g/dL (ref 30.0–36.0)
MCV: 89.5 fL (ref 78.0–100.0)
PLATELETS: 243 10*3/uL (ref 150–400)
RBC: 2.75 MIL/uL — AB (ref 3.87–5.11)
RDW: 19.2 % — AB (ref 11.5–15.5)
WBC: 4.9 10*3/uL (ref 4.0–10.5)

## 2016-02-01 LAB — PREPARE RBC (CROSSMATCH)

## 2016-02-01 LAB — BASIC METABOLIC PANEL
ANION GAP: 13 (ref 5–15)
BUN: 59 mg/dL — ABNORMAL HIGH (ref 6–20)
CHLORIDE: 101 mmol/L (ref 101–111)
CO2: 22 mmol/L (ref 22–32)
Calcium: 9.2 mg/dL (ref 8.9–10.3)
Creatinine, Ser: 2.08 mg/dL — ABNORMAL HIGH (ref 0.44–1.00)
GFR calc non Af Amer: 23 mL/min — ABNORMAL LOW (ref >60)
GFR, EST AFRICAN AMERICAN: 27 mL/min — AB (ref >60)
GLUCOSE: 390 mg/dL — AB (ref 65–99)
POTASSIUM: 4.1 mmol/L (ref 3.5–5.1)
Sodium: 136 mmol/L (ref 135–145)

## 2016-02-01 LAB — TSH

## 2016-02-01 LAB — TROPONIN I
TROPONIN I: 4.92 ng/mL — AB (ref <0.031)
Troponin I: 0.06 ng/mL — ABNORMAL HIGH (ref <0.031)
Troponin I: 3.36 ng/mL
Troponin I: 4.43 ng/mL (ref <0.031)

## 2016-02-01 LAB — GLUCOSE, CAPILLARY
Glucose-Capillary: 386 mg/dL — ABNORMAL HIGH (ref 65–99)
Glucose-Capillary: 449 mg/dL — ABNORMAL HIGH (ref 65–99)

## 2016-02-01 LAB — I-STAT TROPONIN, ED: Troponin i, poc: 0.11 ng/mL (ref 0.00–0.08)

## 2016-02-01 LAB — MAGNESIUM: MAGNESIUM: 2.4 mg/dL (ref 1.7–2.4)

## 2016-02-01 LAB — T4, FREE: FREE T4: 1.96 ng/dL — AB (ref 0.61–1.12)

## 2016-02-01 LAB — BRAIN NATRIURETIC PEPTIDE: B Natriuretic Peptide: 125.1 pg/mL — ABNORMAL HIGH (ref 0.0–100.0)

## 2016-02-01 MED ORDER — ENOXAPARIN SODIUM 30 MG/0.3ML ~~LOC~~ SOLN: 30.0000 mg | SUBCUTANEOUS | Status: DC

## 2016-02-01 MED ORDER — INSULIN ASPART 100 UNIT/ML ~~LOC~~ SOLN
0.0000 [IU] | Freq: Every day | SUBCUTANEOUS | Status: DC
  Administered 2016-02-01: 10 [IU] via SUBCUTANEOUS
  Administered 2016-02-02: 2 [IU] via SUBCUTANEOUS

## 2016-02-01 MED ORDER — ASPIRIN EC 81 MG PO TBEC
81.0000 mg | DELAYED_RELEASE_TABLET | Freq: Every day | ORAL | Status: DC
  Administered 2016-02-02 – 2016-02-03 (×2): 81 mg via ORAL
  Filled 2016-02-01 (×2): qty 1

## 2016-02-01 MED ORDER — INSULIN ASPART 100 UNIT/ML ~~LOC~~ SOLN
0.0000 [IU] | Freq: Three times a day (TID) | SUBCUTANEOUS | Status: DC
Start: 2016-02-01 — End: 2016-02-01
  Administered 2016-02-01: 15 [IU] via SUBCUTANEOUS

## 2016-02-01 MED ORDER — ACETAMINOPHEN 325 MG PO TABS
650.0000 mg | ORAL_TABLET | ORAL | Status: DC | PRN
  Administered 2016-02-02: 650 mg via ORAL
  Filled 2016-02-01: qty 2

## 2016-02-01 MED ORDER — MIRTAZAPINE 7.5 MG PO TABS
15.0000 mg | ORAL_TABLET | Freq: Every day | ORAL | Status: DC
  Administered 2016-02-01 – 2016-02-02 (×2): 15 mg via ORAL
  Filled 2016-02-01 (×2): qty 2

## 2016-02-01 MED ORDER — FUROSEMIDE 10 MG/ML IJ SOLN: 40.0000 mg | Freq: Every day | INTRAMUSCULAR | Status: DC

## 2016-02-01 MED ORDER — IPRATROPIUM-ALBUTEROL 0.5-2.5 (3) MG/3ML IN SOLN
3.0000 mL | RESPIRATORY_TRACT | Status: DC
  Administered 2016-02-01: 3 mL via RESPIRATORY_TRACT
  Filled 2016-02-01: qty 3

## 2016-02-01 MED ORDER — HEPARIN BOLUS VIA INFUSION
4000.0000 [IU] | Freq: Once | INTRAVENOUS | Status: AC
  Administered 2016-02-01: 4000 [IU] via INTRAVENOUS
  Filled 2016-02-01: qty 4000

## 2016-02-01 MED ORDER — ONDANSETRON HCL 4 MG/2ML IJ SOLN: 4.0000 mg | Freq: Four times a day (QID) | INTRAMUSCULAR | Status: DC | PRN

## 2016-02-01 MED ORDER — SODIUM CHLORIDE 0.9 % IV SOLN
Freq: Once | INTRAVENOUS | Status: AC
  Administered 2016-02-01: 23:00:00 via INTRAVENOUS

## 2016-02-01 MED ORDER — INSULIN ASPART 100 UNIT/ML ~~LOC~~ SOLN
0.0000 [IU] | Freq: Three times a day (TID) | SUBCUTANEOUS | Status: DC
  Administered 2016-02-02: 7 [IU] via SUBCUTANEOUS
  Administered 2016-02-02: 4 [IU] via SUBCUTANEOUS
  Administered 2016-02-02: 22 [IU] via SUBCUTANEOUS
  Administered 2016-02-03: 7 [IU] via SUBCUTANEOUS
  Administered 2016-02-03: 3 [IU] via SUBCUTANEOUS

## 2016-02-01 MED ORDER — DOXYCYCLINE HYCLATE 100 MG PO TABS
100.0000 mg | ORAL_TABLET | Freq: Two times a day (BID) | ORAL | Status: DC
  Administered 2016-02-01 – 2016-02-03 (×5): 100 mg via ORAL
  Filled 2016-02-01 (×5): qty 1

## 2016-02-01 MED ORDER — METOPROLOL TARTRATE 25 MG/10 ML ORAL SUSPENSION
12.5000 mg | Freq: Two times a day (BID) | ORAL | Status: DC
  Administered 2016-02-01 – 2016-02-03 (×4): 12.5 mg via ORAL
  Filled 2016-02-01 (×4): qty 5

## 2016-02-01 MED ORDER — POTASSIUM CHLORIDE CRYS ER 20 MEQ PO TBCR
40.0000 meq | EXTENDED_RELEASE_TABLET | Freq: Two times a day (BID) | ORAL | Status: DC
  Administered 2016-02-01 – 2016-02-03 (×4): 40 meq via ORAL
  Filled 2016-02-01 (×4): qty 2

## 2016-02-01 MED ORDER — INSULIN ASPART 100 UNIT/ML ~~LOC~~ SOLN: 0.0000 [IU] | Freq: Every day | SUBCUTANEOUS | Status: DC

## 2016-02-01 MED ORDER — ISOSORBIDE MONONITRATE ER 60 MG PO TB24
60.0000 mg | ORAL_TABLET | Freq: Every day | ORAL | Status: DC
  Administered 2016-02-01 – 2016-02-03 (×3): 60 mg via ORAL
  Filled 2016-02-01 (×3): qty 1

## 2016-02-01 MED ORDER — HEPARIN (PORCINE) IN NACL 100-0.45 UNIT/ML-% IJ SOLN
1300.0000 [IU]/h | INTRAMUSCULAR | Status: DC
  Administered 2016-02-01 (×2): 1000 [IU]/h via INTRAVENOUS
  Filled 2016-02-01 (×2): qty 250

## 2016-02-01 MED ORDER — FUROSEMIDE 10 MG/ML IJ SOLN
40.0000 mg | INTRAMUSCULAR | Status: AC
  Administered 2016-02-01: 40 mg via INTRAVENOUS
  Filled 2016-02-01: qty 4

## 2016-02-01 MED ORDER — ALBUTEROL SULFATE (2.5 MG/3ML) 0.083% IN NEBU: 2.5000 mg | INHALATION_SOLUTION | RESPIRATORY_TRACT | Status: DC | PRN

## 2016-02-01 MED ORDER — DOCUSATE SODIUM 100 MG PO CAPS
100.0000 mg | ORAL_CAPSULE | Freq: Every day | ORAL | Status: DC
  Administered 2016-02-01 – 2016-02-03 (×3): 100 mg via ORAL
  Filled 2016-02-01 (×3): qty 1

## 2016-02-01 MED ORDER — ASPIRIN 81 MG PO CHEW
324.0000 mg | CHEWABLE_TABLET | Freq: Once | ORAL | Status: AC
  Administered 2016-02-01: 324 mg via ORAL
  Filled 2016-02-01: qty 4

## 2016-02-01 MED ORDER — ATORVASTATIN CALCIUM 40 MG PO TABS
40.0000 mg | ORAL_TABLET | Freq: Every day | ORAL | Status: DC
  Administered 2016-02-01 – 2016-02-02 (×2): 40 mg via ORAL
  Filled 2016-02-01 (×3): qty 1

## 2016-02-01 MED ORDER — INSULIN ASPART 100 UNIT/ML ~~LOC~~ SOLN: 0.0000 [IU] | Freq: Three times a day (TID) | SUBCUTANEOUS | Status: DC

## 2016-02-01 MED ORDER — IPRATROPIUM-ALBUTEROL 0.5-2.5 (3) MG/3ML IN SOLN
3.0000 mL | Freq: Three times a day (TID) | RESPIRATORY_TRACT | Status: DC
  Administered 2016-02-01 – 2016-02-02 (×2): 3 mL via RESPIRATORY_TRACT
  Filled 2016-02-01 (×2): qty 3

## 2016-02-01 MED ORDER — PREDNISONE 20 MG PO TABS
50.0000 mg | ORAL_TABLET | Freq: Every day | ORAL | Status: DC
  Administered 2016-02-01 – 2016-02-03 (×3): 50 mg via ORAL
  Filled 2016-02-01 (×3): qty 2

## 2016-02-01 NOTE — Progress Notes (Signed)
ANTICOAGULATION CONSULT NOTE - Initial Consult  Pharmacy Consult for Heparin Indication: chest pain/ACS  Allergies  Allergen Reactions  . Omnipaque [Iohexol] Hives and Other (See Comments)    Pt claims she developed hives after given contrast  . Benzene Rash    Patient Measurements: Weight: 212 lb 9.6 oz (96.435 kg)  IBW 50.1 kg Heparin Dosing Weight: 72.7 kg  Vital Signs: Temp: 97.3 F (36.3 C) (06/07 1344) Temp Source: Oral (06/07 1344) BP: 119/67 mmHg (06/07 1344) Pulse Rate: 115 (06/07 1344)  Labs:  Recent Labs  02/01/16 0800 02/01/16 1309  HGB 7.6*  --   HCT 24.6*  --   PLT 243  --   CREATININE 2.08*  --   TROPONINI 0.06* 3.36*    Estimated Creatinine Clearance: 28 mL/min (by C-G formula based on Cr of 2.08).   Medical History: Past Medical History  Diagnosis Date  . Fibroid   . Morbid obesity ([place])   . 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 COPD (chronic obstructive pulmonary disease) ([place])     a. Uses Home O2 w/ exertion.  . Chronic diastolic CHF (congestive heart failure) ([place])     a. 01/2014 Echo: EF 55%, triv PR.  [name] CAD (coronary artery disease)     a. 08/2012 NSTEMI: Med Rx; b. 10/2012 MV: EF 49%, old inf infarct;  c. 02/2013 NSTEMI-> Med Rx; d. 06/2013 Cath: LM nl, LAD 40p, 80-85p/m, D1 nl, LCX 20p, OM1 nl, RCA 100d w/ L->R collats, EF 30-35%, basal to mid inf AK, ant HK.  [name] Anemia     a. Adm 09/2012 with melena, Hgb 5.8 -> transfused. EGD/colonoscopy unrevealing.  . Breast cancer ([place])     a. Mets to bone. ER 100%/ PR 0%/Her2 neu negative.  [name] Ulcers of both lower legs ([place])   . History of tobacco abuse     a. Quit 2014.  [name] Hyperlipidemia 02/11/2013  . Chronic respiratory failure ([place])     a. On O2 qhs and w/ exertion.  . Lesion of vocal cord     a. CT 05/2013 concerning for tumor.  . Depression   . Diabetes mellitus without complication ([place])   . Dementia     very mild  . Radiation 02/02/14-02/24/14    left  and right femur 30 gray  . Anemia of chronic disease   . CKD (chronic kidney disease), stage IV ([facility])     Medications:  Prescriptions prior to admission  Medication Sig Dispense Refill Last Dose  . albuterol (PROVENTIL HFA;VENTOLIN HFA) 108 (90 BASE) MCG/ACT inhaler Inhale 2 puffs into the lungs every 6 (six) hours as needed for wheezing or shortness of breath. 3 Inhaler 3 01/31/2016 at Unknown time  . Ascorbic Acid (VITAMIN C PO) Take 500 mg by mouth daily.    01/31/2016 at Unknown time  . aspirin EC 81 MG EC tablet Take 1 tablet (81 mg total) by mouth daily. 30 tablet 3 01/31/2016 at Unknown time  . atorvastatin (LIPITOR) 40 MG tablet TAKE 1 TABLET EVERY DAY 90 tablet 3 01/31/2016 at Unknown time  . bag balm OINT ointment Apply 1 g topically as needed for dry skin or irritation. 300 g 11 Past Week at Unknown time  . clotrimazole (LOTRIMIN) 1 % cream Apply 1 application topically daily. 113 g 3 Past Week at Unknown time  . denosumab (XGEVA) 120 MG/1.7ML SOLN injection Inject 120 mg into the skin once. Monthly at  [place]   Past Week at Unknown time  . docusate sodium (COLACE) 100 MG capsule Take 100 mg by mouth daily.   01/31/2016 at Unknown time  . fulvestrant (FASLODEX) 250 MG/5ML injection Inject 250 mg into the muscle every 30 (thirty) days. Cancer center   Past Month at Unknown time  . isosorbide mononitrate (IMDUR) 60 MG 24 hr tablet Take 1 tablet (60 mg total) by mouth daily. 90 tablet 3 01/31/2016 at Unknown time  . mirtazapine (REMERON) 15 MG tablet Take 1 tablet (15 mg total) by mouth at bedtime. 30 tablet 5 01/31/2016 at Unknown time  . NON FORMULARY Place 3 L into the nose as needed (oxygen).    Past Week at Unknown time  . palbociclib (IBRANCE) 100 MG capsule Take 1 capsule (100 mg total) by mouth daily with breakfast. Take whole with food. 21 capsule 0 01/31/2016 at Unknown time  . potassium chloride SA (K-DUR,KLOR-CON) 20 MEQ tablet Take 2 tablets (40 mEq total) by mouth 2 (two) times daily. 360  tablet 6 02/01/2016 at Unknown time  . torsemide (DEMADEX) 20 MG tablet Take 1-4 tablets (20-80 mg total) by mouth See admin instructions. (Patient taking differently: Take 20-40 mg by mouth See admin instructions. 80 mg in the morning 40 mg at night) 90 tablet 3 01/31/2016 at Unknown time  . VITAMIN D, CHOLECALCIFEROL, PO Take 1,000 mg by mouth daily.    01/31/2016 at Unknown time  . nitroGLYCERIN (NITROSTAT) 0.4 MG SL tablet Place 1 tablet (0.4 mg total) under the tongue every 5 (five) minutes as needed for chest pain. 30 tablet 0 unknown    Assessment:  70 y/o F presents with SOB and CP. CP worse with coughing.  Last seen at [address] [facility] for office visit on 6/2 with dx COPD vs PNA, given 1-2 week of productive cough and URI symptoms, had fever with Tmax 102.56F at that time and dyspnea despite 4L O2. Started on Levaquin '500mg'$  x 7 days (started 6/5) and Prednisone x 5 day burst. Lab work with elevated BNP and slightly bump in troponin.  Feel this may be due to demand ischemia from her CHF as patient appears fluid overloaded on exam with pitting edema of the legs and rales in the lungs.  CXR shows likely bronchitis process.  PMH: HLD, DM, HTN, CAD, CHF, COPD, breast cancer with mets, morbid obesity, anemia, CRI, depression, tobacco, mild dementia, BLE chronic venous stasis  QTC 619 (levaquin started 6/5), Scr 2.08, Troponin, 0.06, 0.11, 3.36, BNP 125  Goal of Therapy:  Heparin level 0.3-0.7 units/ml Monitor platelets by anticoagulation protocol: Yes   Plan:  Heparin 4000 unit IV bolus Heparin infusion at 1000 units/hr Heparin level and CBC in 8 hrs. Daily HL and CBC   Aureliano Oshields S. Alford [place], PharmD, BCPS Clinical Staff Pharmacist Pager [phone]  Eilene Ghazi Stillinger 02/01/2016,5:08 PM

## 2016-02-01 NOTE — Progress Notes (Signed)
Orthopedic Tech Progress Note Patient Details:  Anita [name] [birth year] [id number]  Ortho Devices Type of Ortho Device: Louretta Parma boot Ortho Device/Splint Location: (B) LE  Ortho Device/Splint Interventions: Ordered, Application   Braulio Bosch 02/01/2016, 4:35 PM

## 2016-02-01 NOTE — ED Provider Notes (Signed)
  Face-to-face evaluation   History: She complains of shortness of breath, congested cough, white mucus in chest discomfort. Symptoms present for several days. She is taking her usual medications.   Physical exam: Alert, calm, cooperative. No respiratory distress. Rhonchi bilateral lungs. Heart regular rate and rhythm without murmur. Legs with 2+ edema bilaterally and multiple excoriations without bleeding or drainage.  Medical screening examination/treatment/procedure(s) were conducted as a shared visit with non-physician practitioner(s) and myself.  I personally evaluated the patient during the encounter  Daleen Bo, MD 02/01/16 1521

## 2016-02-01 NOTE — ED Notes (Signed)
To ED via [facility] with c/o shortness of breath and chest pain.

## 2016-02-01 NOTE — Consult Note (Signed)
Cardiology Consult    Patient ID: Anita [name] MRN: [id number], DOB/AGE: [birth year]   Admit date: 02/01/2016 Date of Consult: 02/01/2016  Primary Physician: [name], DO Primary Cardiologist: Lenna Sciara. Hochrein, MD  Requesting Provider: S. Nori Riis, MD  Patient Profile    70 y/o ? with a h/o CAD, COPD, diast CHF, CKD IV, Anemia, DM II, and breast cancer w/ bone mets currently on chemotherapy (monthly injections) who was admitted this AM secondary to left chest pressure and bronchitis.  Past Medical History   Past Medical History  Diagnosis Date  . Fibroid   . Morbid obesity ([place])   . 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 COPD (chronic obstructive pulmonary disease) ([place])     a. Uses Home O2.  . Chronic diastolic CHF (congestive heart failure) ([place])     a. 01/2014 Echo: EF 55%, triv PR.  [name] CAD (coronary artery disease)     a. 08/2012 NSTEMI: Med Rx; b. 10/2012 MV: EF 49%, old inf infarct;  c. 02/2013 NSTEMI-> Med Rx; d. 06/2013 Cath: LM nl, LAD 40p, 80-85p/m, D1 nl, LCX 20p, OM1 nl, RCA 100d w/ L->R collats, EF 30-35%, basal to mid inf AK, ant HK.  [name] Anemia     a. Adm 09/2012 with melena, Hgb 5.8 -> transfused. EGD/colonoscopy unrevealing.  . Breast cancer ([place])     a. Mets to bone. ER 100%/ PR 0%/Her2 neu negative.  [name] Ulcers of both lower legs ([place])   . History of tobacco abuse     a. Quit 2014.  [name] Hyperlipidemia 02/11/2013  . Chronic respiratory failure ([place])     a. On O2 qhs. also portable O2.  . Chronic renal insufficiency   . Lesion of vocal cord     a. CT 05/2013 concerning for tumor.  . Depression   . Diabetes mellitus without complication ([place])   . Dementia     very mild  . Radiation 02/02/14-02/24/14    left and right femur 30 gray  . Anemia of chronic disease   . CKD (chronic kidney disease), stage IV [facility])     Past Surgical History  Procedure Laterality Date  . Colonoscopy, esophagogastroduodenoscopy (egd) and esophageal  dilation N/A 10/13/2012    Procedure: colonoscopy and egd;  Surgeon: Wonda Horner, MD;  Location: [facility];  Service: Endoscopy;  Laterality: N/A;  . Left heart catheterization with coronary angiogram N/A 07/07/2013    Procedure: LEFT HEART CATHETERIZATION WITH CORONARY ANGIOGRAM;  Surgeon: Peter M Martinique, MD;  Location: [facility] CATH LAB;  Service: Cardiovascular;  Laterality: N/A;    Allergies  Allergies  Allergen Reactions  . Omnipaque [Iohexol] Hives and Other (See Comments)    Pt claims she developed hives after given contrast  . Benzene Rash   History of Present Illness    70 y/o ? with the above complex PMH.  She has a h/o COPD in the setting of prolonged tobacco abuse (quit 2014), DMII, CKD IV, anemia, diastolic CHF, and right breast cancer with bone and lung metastasis.  PET scan in 06/2015 showed slight progression of bone dzs.  She is currently treated with Ibrance and fulvestrant, along with monthly Xgeva injections for bone mets.  Her cardiac history dates back to 2014, when she had multiple admissions in the setting of NSTEMI and diastolic CHF.  She had a low risk MV in 10/2012 but after two more admissions, she finally underwent cath in  06/2013, revealing an occluded RCA with mod proximal LAD dzs.  Med Rx was recommended, though it was felt that if she had recurrent c/p, the LAD could be intervened upon.  She says that over the past few years, she has done reasonably well from a cardiac standpoint, and does not typically experience chest pain.  She has had admissions for diastolic CHF - the last of which in 03/2014.  Beginning approx 2 wks ago, she began to experience productive cough with white sputum along with chest and sinus congestion.  She has chronic DOE in the setting of COPD, but did not initially feel that she was more dyspneic than usual.  She was seen by primary care on 6/2 and was Rx levaquin and prednisone.  She started the prednisone but not the levaquin (initally sent to  mail order pharmacy) adn f/u on 6/5.  She was feeling somewhat better @ that point and was advised to start the levaquin and continue prednisone.  She did this and was stable but this AM @ approximately 6 am, she awoke with 7/10 substernal and left chest pressure associated with dyspnea.  After about 20 mins of ongoing Ss, she called EMS.  Chest discomfort was worse with cough and deep breathing.  She was taken to the ED and ASA with eventual resolution of Ss.  ECG was non-acute with the exception of QT prolongation (619 msec).  CXR showed "minimal prominence of the right infrahilar bronchovascular markings which may be due to an acute bronchitic process."  She was treated with IV lasix.  Troponin was initially mildly elevated @ 0.11 but since admission, has risen to 3.36.  H/H 7.6/24.6 (down from 8.5/26.1 on 5/26), creat 2.08 (up from 1.9 5/26).  She is currently chest pain free and we've been asked to eval.  Inpatient Medications    . [START ON 02/02/2016] aspirin EC  81 mg Oral Daily  . atorvastatin  40 mg Oral q1800  . docusate sodium  100 mg Oral Daily  . doxycycline  100 mg Oral Q12H  . enoxaparin (LOVENOX) injection  30 mg Subcutaneous Q24H  . furosemide  40 mg Intravenous Daily  . insulin aspart  0-15 Units Subcutaneous TID WC  . ipratropium-albuterol  3 mL Nebulization Q4H  . isosorbide mononitrate  60 mg Oral Daily  . mirtazapine  15 mg Oral QHS  . potassium chloride SA  40 mEq Oral BID  . predniSONE  50 mg Oral Q breakfast    Family History    Family History  Problem Relation Age of Onset  . Cancer Mother     leukemia  . Hypertension Mother   . Diabetes Father   . Heart disease Father     passed away due to heart attack    Social History    Social History   Social History  . Marital Status: Widowed    Spouse Name: N/A  . Number of Children: 2  . Years of Education: N/A   Occupational History  . retired Energy manager    Social History Main Topics  . Smoking status:  Former Smoker -- 2.00 packs/day for 50 years    Types: Cigarettes    Start date: 08/27/1961    Quit date: 09/24/2012  . Smokeless tobacco: Never Used     Comment: Quit when COPD dx  . Alcohol Use: No  . Drug Use: No  . Sexual Activity: No   Other Topics Concern  . Not on file   Social  History Narrative   Lives in [place] with husband Sivan Cuello [phone]; Daughter who would like to make medical decisions is [name] [phone]. Patient in school at [employer] for counseling degree.      Review of Systems    General:  No chills, +++ fever about a week ago.  No night sweats or weight changes.  Cardiovascular:  +++ chest pain, +++ chronic dyspnea on exertion, +++ chronic LE edema with skin flaking.  No orthopnea, palpitations, paroxysmal nocturnal dyspnea. Dermatological: +++ bilat lower ext skin flaking and redness - also noted on bilat upper back. Respiratory: +++ productive cough with white sputum x 2 wks, +++ chronic dyspnea - uses home O2 @ night and w/ exertion. Urologic: No hematuria, dysuria Abdominal:   No nausea, vomiting, diarrhea, bright red blood per rectum, melena, or hematemesis Neurologic:  No visual changes, wkns, changes in mental status. All other systems reviewed and are otherwise negative except as noted above.  Physical Exam    Blood pressure 119/67, pulse 115, temperature 97.3 F (36.3 C), temperature source Oral, resp. rate 20, weight 212 lb 9.6 oz (96.435 kg), SpO2 100 %.  General: Pleasant, NAD Psych: Normal affect. Neuro: Alert and oriented X 3. Moves all extremities spontaneously. HEENT: Normal  Neck: Supple without bruits or JVD. Lungs:  Resp regular and unlabored, diminished breath sounds bilaterally. Heart: RRR, tachy, no s3, s4, or murmurs. Abdomen: Soft, non-tender, non-distended, BS + x 4.  Extremities: No clubbing, cyanosis.  Trace to 1+ bilat LE/ankle edema with flaking skin. DP/PT/Radials 1+ and equal bilaterally.  Labs    Troponin  [facility] Test)  Recent Labs  02/01/16 0834  TROPIPOC 0.11*    Recent Labs  02/01/16 0800 02/01/16 1309  TROPONINI 0.06* 3.36*   Lab Results  Component Value Date   WBC 4.9 02/01/2016   HGB 7.6* 02/01/2016   HCT 24.6* 02/01/2016   MCV 89.5 02/01/2016   PLT 243 02/01/2016     Recent Labs Lab 02/01/16 0800  NA 136  K 4.1  CL 101  CO2 22  BUN 59*  CREATININE 2.08*  CALCIUM 9.2  GLUCOSE 390*   Lab Results  Component Value Date   CHOL 121* 04/01/2015   HDL 36* 04/01/2015   LDLCALC 54 04/01/2015   TRIG 157* 04/01/2015   Radiology Studies    Dg Chest 2 View  02/01/2016  CLINICAL DATA:  Midline chest pain and productive cough which shortness-of-breath 2-3 weeks. COPD. EXAM: CHEST  2 VIEW COMPARISON:  01/20/2016 FINDINGS: Lungs are adequately inflated without focal consolidation or effusion. Minimal prominence of the right infrahilar bronchovascular markings. Mild stable cardiomegaly. Minimal calcified plaque over the aortic arch. There are mild degenerative changes of the spine. IMPRESSION: Minimal prominence of the right infrahilar bronchovascular markings which may be due to an acute bronchitic process. Electronically Signed   By: Marin Olp M.D.   On: 02/01/2016 09:03   Dg Chest 2 View  01/20/2016  CLINICAL DATA:  Wheezing, cough, shortness of breath. EXAM: CHEST  2 VIEW COMPARISON:  May 12, 2015. FINDINGS: The heart size and mediastinal contours are within normal limits. Both lungs are clear. No pneumothorax or pleural effusion is noted. Osseous metastatic lesions are again noted involving the ribs and shoulder regions. IMPRESSION: No acute cardiopulmonary abnormality seen. Multiple osseous metastatic lesions are again noted. Electronically Signed   By: Marijo Conception, M.D.   On: 01/20/2016 16:24   ECG & Cardiac Imaging  Sinus tachycardia, 104, right atrial enlargement, no acute st/t changes.  Assessment & Plan    1.  NSTEMI/CAD:  Pt presented this  AM after awaking with 7/10 chest pressure, eventually relieved with ASA in the ED.  Initial troponin (poc) was mildly elevated @ 0.11 but has since risen to 3.36.  She has a h/o CAD with cath in 06/2013 revealing moderate to severe proximal LAD dzs and an occluded RCA.  She is currently chest pain free on asa, statin, and long-acting nitrate.  As she is not actively wheezing and is tachycardic with suppressed TSH, I will add low-dose  blocker.  She is currently on DVT dose lovenox - I will switch to heparin for now, but we will have to watch H/H closely in setting of slightly worsened anemia.  Ms. Nelson prefers to avoid catheterization and given co-morbidities present currently including anemia (7.6/24.6), CKD IV with creat of 2.08 - slightly worse than previous, and possible hyperthyroidism, she is not an ideal candidate for cath at this time.  Continue medical therapy. F/u echo and trend troponins.  If troponins elevate markedly, she has recurrent/worsening c/p, or echo shows new LV dysfxn, we may need to reconsider cath though if thyroid testing confirms hyperthyroidism however, we will need to avoid iodinated contrast.  2.  AECOPD/Brochitis:  Recently started on prednisone and levaquin as outpt in setting of 2'ish wk h/o productive cough with chest and sinus congestion.  QTc long on admission and levaquin d/c'd.  No active wheezing.  Mgmt per IM.  3.  Anemia of chronic disease:  H/H currently 7.6/24.6, down from 8.5/26.1 on 5/26.  Anemia possibly contributing to demand ischemia, esp in setting of mod-severe LAD dzs, and thus we would strongly recommend transfusion.  No recent melena/brbpr.  4.  Suppressed TSH:  She has a h/o this with previously nl FT4.  Not eval here in several years.  She is not on replacement @ home.  She notes that her HR's are always elevated. Further w/u per IM.  Add  blocker.  5.  CKD IV:  Creat up slightly above baseline.  Follow.   6.  HL:  LDL 54 in 03/2015.  Cont statin  therapy.  7.  Chronic diastolic CHF: previously nl EF by echo in 01/2014.  Wt recorded as up 5-8 lbs since 6/2.  BP stable.  HR elevated.  Exam challenging for volume overload though BNP nl.  She has received a dose of IV lasix.  I suspect we can switch her back to previous home dose of torsemide.  8.  Prolonged QT:  In setting of levaquin.  F/u ECG @ slightly slower HR w/ normalization of QT.  Follow.  Signed, Murray Hodgkins, NP 02/01/2016, 3:44 PM

## 2016-02-01 NOTE — H&P (Signed)
[facility] Admission History and Physical Service Pager: [phone]  Patient name: Anita [name] Medical record number: [id number] Date of birth: [birth year] Age: 70 y.o. Gender: female  Primary Care Provider: [name], DO Consultants: Cardiology Code Status: Full  Chief Complaint: Chest pain, productive cough / dyspnea  Assessment and Plan: Anita [name] is a 70 y.o. female presenting with new onset chest pain, in setting of recent productive cough / dyspnea concern for COPD exacerbation. 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  Chest pain, rule out MI, with history of CAD s/p prior NSTEMI Consistent with mixed atypical / typical chest pain new onset earlier today, most likely related to recent illness with persistent cough and possible AECOPD, however given significant cardiac history, Heart Score 5, relieved with ASA in ED, now elevated troponin on initial test in ED, EKG without ischemic changes, will admit for further monitoring. Currently in ED without active chest pain - Admit to inpatient, telemetry - S/p ASA 325 in ED - Cardiology consulted in ED, appreciate further recommendations on potential ischemic testing - Resume ASA 81 daily tomorrow, continue statin, not on beta blocker at this time - Cycle troponin-I x2 more, or until troponin stabilizes - Repeat EKG in AM - Consider future repeat ECHO, last 01/2014 - Risk stratification labs lipids and TSH, last A1c 6.7 (11/2015)  Dyspnea / Productive Cough, presumed AECOPD, in setting chronic respiratory failure Recently treated outpatient for AECOPD, however did not complete antibiotic / prednisone course only x 1 day treatment. Clinically now without hypoxia (despite chronic resp failure), no significant  wheezing but some reduced air movement. CXR R>L lower more likely bronchitis consider potential infiltrate. Considered potential PE with tachycardia and some dyspnea, inc risk on anti-estrogen treatment for metastatic breast cancer, Well's Score 2.5 (still unlikely risk). - S/p Lasix '40mg'$  IV in ED - DC'd Levaquin. Start doxycycline '100mg'$  BID x 7 days - Resume Prednisone '50mg'$  daily x 5 days - Duoneb q 4 hour scheduled x 3 then can resume PRN - Albuterol q 2 hr PRN - Supplemental O2 PRN  Prolonged QTc >600 Recently treated with Levaquin for COPD, only received x 1 dose yesterday. QTc today >600 - Discontinue Levaquin - Check mag level, replete if < 2 - Repeat EKG in AM  Chronic Diastolic CHF, without clear evidence of acute exacerbation Clinically without significant evidence of volume overload on exam but does have some bibasilar crackles, seems to be nearly at dry wt 209 lbs per prior cardiology note 08/2015. BNP mild elevated 125, prior has been higher. Last ECHO 55% EF in 2015. - S/p Lasix '40mg'$  IV in ED - Hold home torsemide 80 AM and 40 PM (had not previously missed), can resume PO diuresis tomorrow if improved - Check daily wt, strict I/O  Metastatic breast cancer to bone/lung, on chronic anti-estrogen therapy Stable, dx initially 2014. followed by Oncology. On Ibrance anti-estrogen therapy.  CKD-IV B/l Cr 1.8 - 2.0, near baseline today, elevated BUN clinically mildly dry also with lower BP - Follow Cr, volume status  Diabetes, Type 2 Last A1c 6.7 (11/2015). Not on medication. Concern given prednisone now. - Start moderate SSI, no night time coverage, no meal time - CBG checks q AC  Chronic venous stasis, bilateral lower ext - Consult wound care - may need unna  boots in future  FEN/GI: SLIV, heart diet Prophylaxis: Lovenox SQ per pharmacy with reduced GFR  Disposition: Admit to telemetry inpatient for chest pain rule out MI, cardiology consult, possible acute CHF vs acute  COPD etiology of dyspnea, treated with IV Lasix, continue antibiotics and prednisone, follow clinical course.  History of Present Illness: Anita [name] is a 70 y.o. female presenting with chest pain and persistent productive cough.  Patient reports that symptoms worsened today with development of new onset Left sided chest pressure around 0600 today, intermittent brief episodes lasting seconds to minutes, present at rest and with minimal activity, also worse with cough, was persistent until arrived to ED via EMS and given ASA 0900 since resolved, currently denies any active chest pain or pressure.  Recent history, seen at [facility] on 6/2 with dx COPD vs PNA given 1-2 week of productive cough and URI symptoms, had fever with Tmax 102.26F at that time and dyspnea despite 4L O2. She was started on Levaquin '500mg'$  x 7 days and Prednisone x 5 day burst. Due to issues with rx, delayed in starting, she now states picked up both medications on Monday and did not start either Prednisone or Levaquin until Tuesday 6/6, has only taken one dose of each, no doses today.  Currently she reports persistent worsening productive cough with thick mucus, has not been regularly using O2, does not always need it but uses up to 4L when needed, usually needs with activity and not always overnight. Not using regular albuterol, no relief.  Followed by Cardiology last seen in office 08/2015, suspected dry wt 209 lbs. Admits to history of MI with NSTEMI.  Admits to some chronic LE edema and dry skin changes in legs Denies recent fevers/chills, sick contacts, sweats, nausea, vomiting, worsening LE edema, syncope  Review Of Systems: Per HPI with the following additions: none Otherwise the remainder of the systems were negative.  Patient Active Problem List   Diagnosis Date Noted  . Chest pain 02/01/2016  . COPD exacerbation ([place]) 12/01/2015  . Erythema of lower extremity 12/01/2015  . Palliative care encounter   . Wound  cellulitis   . Severe sepsis ([place]) 05/12/2015  . Sepsis ([place]) 05/12/2015  . AKI (acute kidney injury) ([place]) 05/12/2015  . Leg wound, right 05/12/2015  . Laceration   . Absolute anemia   . CAD in native artery   . Venous stasis ulcer ([place])   . COLD (chronic obstructive lung disease) ([place])   . Leg laceration 04/04/2015  . Laceration of lower extremity 04/04/2015  . Venous stasis of lower extremity 12/05/2014  . Intertrigo 02/19/2014  . 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 Hyperlipidemia 02/11/2013  . Breast cancer metastasized to bone ([place]) 10/14/2012  . Chronic respiratory failure ([place]) 10/06/2012  . Chronic diastolic heart failure ([place]) 10/06/2012  . CAD (coronary artery disease) 10/06/2012  . CKD (chronic kidney disease), stage IV ([place]) 10/06/2012  . Anemia of chronic disease 10/06/2012  . Breast mass, right 10/06/2012  . History of tobacco abuse 10/06/2012  . COPD (chronic obstructive pulmonary disease) ([place]) 09/27/2012  . DM2 (diabetes mellitus, type 2) ([place]) 09/26/2012    Past Medical History: Past Medical History  Diagnosis Date  . Fibroid   . Morbid obesity ([place])   . 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 COPD (chronic obstructive pulmonary disease) ([place])   . Chronic diastolic CHF (congestive heart failure) ([place])   . NSTEMI (non-ST  elevated myocardial infarction) [facility])     Cath November 2014 LAD 40% stenosis, first diagonal 80% stenosis, circumflex 20% stenosis, right coronary artery occluded. The EF was 35-40% at that time.  . Anemia     a. Adm 09/2012 with melena, Hgb 5.8 -> transfused. EGD/colonoscopy unrevealing.  . Breast cancer ([place])     a. Mets to bone. ER 100%/ PR 0%/Her2 neu negative.  [name] Ulcers of both lower legs ([place])   . Tobacco abuse   . Hyperlipidemia 02/11/2013  . Chronic respiratory failure ([place])     a. On O2 qhs. also portable O2.  . Chronic renal insufficiency   . Lesion of vocal cord     a. CT 05/2013  concerning for tumor.  . Shortness of breath   . Depression   . Diabetes mellitus without complication ([place])   . Anginal pain ([place])   . Dementia     very mild  . Radiation 02/02/14-02/24/14    left and right femur 30 gray    Past Surgical History: Past Surgical History  Procedure Laterality Date  . Colonoscopy, esophagogastroduodenoscopy (egd) and esophageal dilation N/A 10/13/2012    Procedure: colonoscopy and egd;  Surgeon: Wonda Horner, MD;  Location: [facility];  Service: Endoscopy;  Laterality: N/A;  . Left heart catheterization with coronary angiogram N/A 07/07/2013    Procedure: LEFT HEART CATHETERIZATION WITH CORONARY ANGIOGRAM;  Surgeon: Peter M Martinique, MD;  Location: [facility] CATH LAB;  Service: Cardiovascular;  Laterality: N/A;    Social History: Social History  Substance Use Topics  . Smoking status: Former Smoker -- 2.00 packs/day for 50 years    Types: Cigarettes    Start date: 08/27/1961    Quit date: 09/24/2012  . Smokeless tobacco: Never Used     Comment: Quit when COPD dx  . Alcohol Use: No   Please also refer to relevant sections of EMR.  Family History: Family History  Problem Relation Age of Onset  . Cancer Mother     leukemia  . Hypertension Mother   . Diabetes Father   . Heart disease Father     passed away due to heart attack   Allergies and Medications: Allergies  Allergen Reactions  . Omnipaque [Iohexol] Hives and Other (See Comments)    Pt claims she developed hives after given contrast  . Benzene Rash   No current facility-administered medications on file prior to encounter.   Current Outpatient Prescriptions on File Prior to Encounter  Medication Sig Dispense Refill  . albuterol (PROVENTIL HFA;VENTOLIN HFA) 108 (90 BASE) MCG/ACT inhaler Inhale 2 puffs into the lungs every 6 (six) hours as needed for wheezing or shortness of breath. 3 Inhaler 3  . Ascorbic Acid (VITAMIN C PO) Take 500 mg by mouth daily.     [name] aspirin EC 81 MG EC tablet Take  1 tablet (81 mg total) by mouth daily. 30 tablet 3  . atorvastatin (LIPITOR) 40 MG tablet TAKE 1 TABLET EVERY DAY 90 tablet 3  . bag balm OINT ointment Apply 1 g topically as needed for dry skin or irritation. 300 g 11  . clotrimazole (LOTRIMIN) 1 % cream Apply 1 application topically daily. 113 g 3  . denosumab (XGEVA) 120 MG/1.7ML SOLN injection Inject 120 mg into the skin once. Monthly at [facility]    . docusate sodium (COLACE) 100 MG capsule Take 100 mg by mouth daily.    . fulvestrant (FASLODEX) 250 MG/5ML injection Inject 250 mg into the muscle every 30 (  thirty) days. Cancer center    . isosorbide mononitrate (IMDUR) 60 MG 24 hr tablet Take 1 tablet (60 mg total) by mouth daily. 90 tablet 3  . levofloxacin (LEVAQUIN) 500 MG tablet Take 1 tablet (500 mg total) by mouth daily. 7 tablet 0  . mirtazapine (REMERON) 15 MG tablet Take 1 tablet (15 mg total) by mouth at bedtime. 30 tablet 5  . NON FORMULARY Place 3 L into the nose as needed (oxygen).     . palbociclib (IBRANCE) 100 MG capsule Take 1 capsule (100 mg total) by mouth daily with breakfast. Take whole with food. 21 capsule 0  . potassium chloride SA (K-DUR,KLOR-CON) 20 MEQ tablet Take 2 tablets (40 mEq total) by mouth 2 (two) times daily. 360 tablet 6  . predniSONE (DELTASONE) 20 MG tablet Take 2 tablets (40 mg total) by mouth daily with breakfast. 10 tablet 0  . torsemide (DEMADEX) 20 MG tablet Take 1-4 tablets (20-80 mg total) by mouth See admin instructions. (Patient taking differently: Take 20-40 mg by mouth See admin instructions. 80 mg in the morning 40 mg at night) 90 tablet 3  . VITAMIN D, CHOLECALCIFEROL, PO Take 1,000 mg by mouth daily.     . nitroGLYCERIN (NITROSTAT) 0.4 MG SL tablet Place 1 tablet (0.4 mg total) under the tongue every 5 (five) minutes as needed for chest pain. 30 tablet 0    Objective: BP 100/51 mmHg  Pulse 114  Resp 25  SpO2 97% Exam: General: chronically ill 70 yr female, laying in ED bed, seems  comfortable, cooperative Eyes: conjunctiva clear without discharge or injection, PERRL ENTM: clear nares without congestion, oropharynx clear with dry mucus mem and tongue Neck: supple without significant JVD Cardiovascular: tachycardic, regular rhythm, no murmurs, intact +2 distal pulses Respiratory: Moderate air movement with good respiratory effort, no clear wheezing or coarse sounds, bilateral bibasilar crackles R>L, no significant coughing, speaks full short sentences, no supplemental O2 Abdomen: soft, obese, non-tender, non-distended, +active BS MSK: distal strength 5/5 grip and ankles, no deformity Skin: warm, dry, bilateral lower ext with chronic venous stasis changes with dry peeling skin some underlying erythema with scattered healing abrasion without discrete ulceration. Left buttocks with stage 1 pressure ulcer without broken skin mild erythema only Neuro: grossly intact and non-focal, distal sensation to light touch intact, AAOx3 Psych: normal mood and affect, good insight  Labs and Imaging: CBC BMET   Recent Labs Lab 02/01/16 0800  WBC 4.9  HGB 7.6*  HCT 24.6*  PLT 243    Recent Labs Lab 02/01/16 0800  NA 136  K 4.1  CL 101  CO2 22  BUN 59*  CREATININE 2.08*  GLUCOSE 390*  CALCIUM 9.2     Poc-Troponin - 0.11 Troponin-I 0.06 BNP 125.1  Imaging  6/7 0848 CXR 2v IMPRESSION: Minimal prominence of the right infrahilar bronchovascular markings which may be due to an acute bronchitic process.  6/7 ED EKG Sinus tachycardia, QTc prolonged >600, some anterolateral repolarization, otherwise no acute ST-T changes, stable since prior  Olin Hauser, DO 02/01/2016, 10:55 AM PGY-3, [place] [facility] pager: [phone], text pages welcome

## 2016-02-01 NOTE — ED Provider Notes (Signed)
CSN: [id number]     Arrival date & time 02/01/16  0803 History   First MD Initiated Contact with Patient 02/01/16 [phone]     Chief Complaint  Patient presents with  . Chest Pain  . Shortness of Breath     (Consider location/radiation/quality/duration/timing/severity/associated sxs/prior Treatment) Patient is a 70 y.o. female presenting with chest pain and shortness of breath. The history is provided by the patient and medical records.  Chest Pain Associated symptoms: shortness of breath   Shortness of Breath Associated symptoms: chest pain     70 year old female with history of hyperlipidemia, diabetes, hypertension, congestive heart failure, COPD, breast cancer with long and bony metastases, presenting to the ED for chest pain and shortness of breath. She reports this began at 0600 this morning. States pain was initially constant, but has improved somewhat. She states pain is worse with coughing. She has had a productive cough for over a week now. She reports phlegm is thick and white in color. States she also feels that she may be in acute CHF. She does have history of NSTEMI in the past.  She states she is followed by cardiology, Dr. Percival Spanish.  She has been taking her torsemide as directed, no missed doses.  Denies fever, chills, sweats.  Patient is not currently undergoing chemotherapy or radiation for her cancer, she does take oral anti-estrogen and ibrance per oncology.  She states her tumors have been shrinking which she is happy about.  Patient does use PRN oxygen for her CHF/COPD.  Past Medical History  Diagnosis Date  . Fibroid   . Morbid obesity ([place])   . 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 COPD (chronic obstructive pulmonary disease) ([place])   . Chronic diastolic CHF (congestive heart failure) ([place])   . NSTEMI (non-ST elevated myocardial infarction) [facility])     Cath November 2014 LAD 40% stenosis, first diagonal 80% stenosis, circumflex 20%  stenosis, right coronary artery occluded. The EF was 35-40% at that time.  . Anemia     a. Adm 09/2012 with melena, Hgb 5.8 -> transfused. EGD/colonoscopy unrevealing.  . Breast cancer ([place])     a. Mets to bone. ER 100%/ PR 0%/Her2 neu negative.  [name] Ulcers of both lower legs ([place])   . Tobacco abuse   . Hyperlipidemia 02/11/2013  . Chronic respiratory failure ([place])     a. On O2 qhs. also portable O2.  . Chronic renal insufficiency   . Lesion of vocal cord     a. CT 05/2013 concerning for tumor.  . Shortness of breath   . Depression   . Diabetes mellitus without complication ([place])   . Anginal pain ([place])   . Dementia     very mild  . Radiation 02/02/14-02/24/14    left and right femur 30 gray   Past Surgical History  Procedure Laterality Date  . Colonoscopy, esophagogastroduodenoscopy (egd) and esophageal dilation N/A 10/13/2012    Procedure: colonoscopy and egd;  Surgeon: Wonda Horner, MD;  Location: [facility];  Service: Endoscopy;  Laterality: N/A;  . Left heart catheterization with coronary angiogram N/A 07/07/2013    Procedure: LEFT HEART CATHETERIZATION WITH CORONARY ANGIOGRAM;  Surgeon: Peter M Martinique, MD;  Location: [facility] CATH LAB;  Service: Cardiovascular;  Laterality: N/A;   Family History  Problem Relation Age of Onset  . Cancer Mother     leukemia  . Hypertension Mother   . Diabetes Father   .  Heart disease Father     passed away due to heart attack   Social History  Substance Use Topics  . Smoking status: Former Smoker -- 2.00 packs/day for 50 years    Types: Cigarettes    Start date: 08/27/1961    Quit date: 09/24/2012  . Smokeless tobacco: Never Used     Comment: Quit when COPD dx  . Alcohol Use: No   OB History    Gravida Para Term Preterm AB TAB SAB Ectopic Multiple Living   _0 Review of Systems  Respiratory: Positive for shortness of breath.   Cardiovascular: Positive for chest pain.  All other systems reviewed and are  negative.     Allergies  Omnipaque and Benzene  Home Medications   Prior to Admission medications   Medication Sig Start Date End Date Taking? Authorizing Provider  albuterol (PROVENTIL HFA;VENTOLIN HFA) 108 (90 BASE) MCG/ACT inhaler Inhale 2 puffs into the lungs every 6 (six) hours as needed for wheezing or shortness of breath. 12/08/14   Chesley Mires, MD  Ascorbic Acid (VITAMIN C PO) Take 500 mg by mouth daily.     Historical Provider, MD  aspirin EC 81 MG EC tablet Take 1 tablet (81 mg total) by mouth daily. 10/02/12   Leone Haven, MD  atorvastatin (LIPITOR) 40 MG tablet TAKE 1 TABLET EVERY DAY 01/04/16   Nicolette Bang, DO  bag balm OINT ointment Apply 1 g topically as needed for dry skin or irritation. 12/03/14   Coral Spikes, DO  clotrimazole (LOTRIMIN) 1 % cream Apply 1 application topically daily. 09/12/15   Minus Breeding, MD  denosumab (XGEVA) 120 MG/1.7ML SOLN injection Inject 120 mg into the skin once. Monthly at [facility]    Historical Provider, MD  docusate sodium (COLACE) 100 MG capsule Take 100 mg by mouth daily.    Historical Provider, MD  fulvestrant (FASLODEX) 250 MG/5ML injection Inject 250 mg into the muscle once. One injection each buttock over 1-2 minutes. Warm prior to use.    Historical Provider, MD  isosorbide mononitrate (IMDUR) 60 MG 24 hr tablet Take 1 tablet (60 mg total) by mouth daily. 12/01/15   Nicolette Bang, DO  levofloxacin (LEVAQUIN) 500 MG tablet Take 1 tablet (500 mg total) by mouth daily. 01/28/16   Veatrice Bourbon, MD  mirtazapine (REMERON) 15 MG tablet Take 1 tablet (15 mg total) by mouth at bedtime. 12/01/15   Nicolette Bang, DO  nitroGLYCERIN (NITROSTAT) 0.4 MG SL tablet Place 1 tablet (0.4 mg total) under the tongue every 5 (five) minutes as needed for chest pain. 03/17/13   Mariel Aloe, MD  NON FORMULARY Place 3 L into the nose as needed (oxygen).     Historical Provider, MD  palbociclib Leslee Home) 100 MG capsule Take 1 capsule  (100 mg total) by mouth daily with breakfast. Take whole with food. 10/21/15   Truitt Merle, MD  potassium chloride SA (K-DUR,KLOR-CON) 20 MEQ tablet Take 2 tablets (40 mEq total) by mouth 2 (two) times daily. 12/01/15   Nicolette Bang, DO  predniSONE (DELTASONE) 20 MG tablet Take 2 tablets (40 mg total) by mouth daily with breakfast. 01/27/16   [place]ner Mould, MD  torsemide (DEMADEX) 20 MG tablet Take 1-4 tablets (20-80 mg total) by mouth See admin instructions. 12/01/15   Nicolette Bang, DO  VITAMIN D, CHOLECALCIFEROL, PO Take 1,000 mg by mouth daily.  Historical Provider, MD   BP 100/51 mmHg  Pulse 114  Resp 25  SpO2 97%   Physical Exam  Constitutional: She is oriented to person, place, and time. She appears well-developed and well-nourished.  obese  HENT:  Head: Normocephalic and atraumatic.  Mouth/Throat: Oropharynx is clear and moist.  Eyes: Conjunctivae and EOM are normal. Pupils are equal, round, and reactive to light.  Neck: Normal range of motion.  Cardiovascular: Normal rate, regular rhythm and normal heart sounds.   Pulmonary/Chest: Effort normal. She has no decreased breath sounds. She has rhonchi.  Wet, rattling cough noted on exam; rales noted at the bases  Abdominal: Soft. Bowel sounds are normal.  Musculoskeletal: Normal range of motion.  2+ edema noted to BLE Chronic wounds of lower legs; left leg is bandaged; no apparent cellulitis or drainage noted  Neurological: She is alert and oriented to person, place, and time.  Skin: Skin is warm and dry.  Psychiatric: She has a normal mood and affect.  Nursing note and vitals reviewed.   ED Course  Procedures (including critical care time) Labs Review Labs Reviewed  CBC - Abnormal; Notable for the following:    RBC 2.75 (*)    Hemoglobin 7.6 (*)    HCT 24.6 (*)    RDW 19.2 (*)    All other components within normal limits  BRAIN NATRIURETIC PEPTIDE - Abnormal; Notable for the following:    B  Natriuretic Peptide 125.1 (*)    All other components within normal limits  BASIC METABOLIC PANEL - Abnormal; Notable for the following:    Glucose, Bld 390 (*)    BUN 59 (*)    Creatinine, Ser 2.08 (*)    GFR calc non Af Amer 23 (*)    GFR calc Af Amer 27 (*)    All other components within normal limits  TROPONIN I - Abnormal; Notable for the following:    Troponin I 0.06 (*)    All other components within normal limits  I-STAT TROPOININ, ED - Abnormal; Notable for the following:    Troponin i, poc 0.11 (*)    All other components within normal limits    Imaging Review Dg Chest 2 View  02/01/2016  CLINICAL DATA:  Midline chest pain and productive cough which shortness-of-breath 2-3 weeks. COPD. EXAM: CHEST  2 VIEW COMPARISON:  01/20/2016 FINDINGS: Lungs are adequately inflated without focal consolidation or effusion. Minimal prominence of the right infrahilar bronchovascular markings. Mild stable cardiomegaly. Minimal calcified plaque over the aortic arch. There are mild degenerative changes of the spine. IMPRESSION: Minimal prominence of the right infrahilar bronchovascular markings which may be due to an acute bronchitic process. Electronically Signed   By: Marin Olp M.D.   On: 02/01/2016 09:03   I have personally reviewed and evaluated these images and lab results as part of my medical decision-making.   EKG Interpretation   Date/Time:  Wednesday February 01 2016 08:13:27 EDT Ventricular Rate:  123 PR Interval:  147 QRS Duration: 107 QT Interval:  433 QTC Calculation: 619 R Axis:   98 Text Interpretation:  Sinus tachycardia Consider right atrial enlargement  Right axis deviation Nonspecific T abnrm, anterolateral leads Prolonged QT  interval Since last tracing QT has lengthened Confirmed by Eulis Foster  MD,  ELLIOTT ([id number]) on 02/01/2016 8:38:45 AM      MDM   Final diagnoses:  Chest pain, unspecified chest pain type  Elevated troponin  Acute on chronic congestive heart  failure, unspecified congestive heart failure  type [facility])   70 year old female here with chest pain or shortness of breath. She has multiple comorbidities and known cardiac history. Today her EKG shows a prolonged QT, recently put on levaquin which may be responsible for this.  Lab work with elevated BNP and slightly bump in troponin.  Feel this may be due to demand ischemia from her CHF as patient appears fluid overloaded on exam with pitting edema of the legs and rales in the lungs.  CXR shows likely bronchitis process.  Patient given ASA and 67m lasix in ED.  Patient admitted to family practice service for further management.  They will change antibiotics after evaluation given prolonged QT.  Cardiology called regarding elevated troponin, will see in consult.  LLarene Pickett PA-C 02/01/16 1207  EDaleen Bo MD 02/01/16 1[place] MD 02/01/16 1520

## 2016-02-02 ENCOUNTER — Telehealth: Payer: Self-pay | Admitting: *Deleted

## 2016-02-02 ENCOUNTER — Inpatient Hospital Stay (HOSPITAL_COMMUNITY): Payer: Commercial Managed Care - HMO

## 2016-02-02 DIAGNOSIS — N184 Chronic kidney disease, stage 4 (severe): Secondary | ICD-10-CM | POA: Diagnosis not present

## 2016-02-02 DIAGNOSIS — N183 Chronic kidney disease, stage 3 (moderate): Secondary | ICD-10-CM

## 2016-02-02 DIAGNOSIS — I509 Heart failure, unspecified: Secondary | ICD-10-CM | POA: Diagnosis not present

## 2016-02-02 DIAGNOSIS — C50919 Malignant neoplasm of unspecified site of unspecified female breast: Secondary | ICD-10-CM | POA: Diagnosis not present

## 2016-02-02 DIAGNOSIS — J9611 Chronic respiratory failure with hypoxia: Secondary | ICD-10-CM | POA: Diagnosis not present

## 2016-02-02 DIAGNOSIS — J9610 Chronic respiratory failure, unspecified whether with hypoxia or hypercapnia: Secondary | ICD-10-CM | POA: Diagnosis not present

## 2016-02-02 DIAGNOSIS — Z87891 Personal history of nicotine dependence: Secondary | ICD-10-CM | POA: Diagnosis not present

## 2016-02-02 DIAGNOSIS — I214 Non-ST elevation (NSTEMI) myocardial infarction: Secondary | ICD-10-CM | POA: Insufficient documentation

## 2016-02-02 DIAGNOSIS — R0789 Other chest pain: Secondary | ICD-10-CM | POA: Diagnosis not present

## 2016-02-02 DIAGNOSIS — I25119 Atherosclerotic heart disease of native coronary artery with unspecified angina pectoris: Secondary | ICD-10-CM | POA: Diagnosis not present

## 2016-02-02 DIAGNOSIS — R079 Chest pain, unspecified: Secondary | ICD-10-CM | POA: Insufficient documentation

## 2016-02-02 DIAGNOSIS — I130 Hypertensive heart and chronic kidney disease with heart failure and stage 1 through stage 4 chronic kidney disease, or unspecified chronic kidney disease: Secondary | ICD-10-CM | POA: Diagnosis not present

## 2016-02-02 DIAGNOSIS — C7800 Secondary malignant neoplasm of unspecified lung: Secondary | ICD-10-CM | POA: Diagnosis not present

## 2016-02-02 DIAGNOSIS — C7951 Secondary malignant neoplasm of bone: Secondary | ICD-10-CM

## 2016-02-02 DIAGNOSIS — E1122 Type 2 diabetes mellitus with diabetic chronic kidney disease: Secondary | ICD-10-CM

## 2016-02-02 DIAGNOSIS — I5033 Acute on chronic diastolic (congestive) heart failure: Secondary | ICD-10-CM | POA: Diagnosis not present

## 2016-02-02 LAB — CBC
HCT: 26.3 % — ABNORMAL LOW (ref 36.0–46.0)
Hemoglobin: 8.3 g/dL — ABNORMAL LOW (ref 12.0–15.0)
MCH: 27.9 pg (ref 26.0–34.0)
MCHC: 31.6 g/dL (ref 30.0–36.0)
MCV: 88.6 fL (ref 78.0–100.0)
PLATELETS: 247 10*3/uL (ref 150–400)
RBC: 2.97 MIL/uL — ABNORMAL LOW (ref 3.87–5.11)
RDW: 17.9 % — AB (ref 11.5–15.5)
WBC: 5.2 10*3/uL (ref 4.0–10.5)

## 2016-02-02 LAB — ECHOCARDIOGRAM COMPLETE
EWDT: 85 ms
FS: 26 % — AB (ref 28–44)
IVS/LV PW RATIO, ED: 1.38
LA ID, A-P, ES: 34 mm
LA vol A4C: 78.6 ml
LA vol: 83.7 mL
LADIAMINDEX: 1.6 cm/m2
LAVOLIN: 39.4 mL/m2
LEFT ATRIUM END SYS DIAM: 34 mm
LV PW d: 8 mm — AB (ref 0.6–1.1)
LV TDI E'LATERAL: 6.09
LV TDI E'MEDIAL: 12.8
LVELAT: 6.09 cm/s
LVOT area: 3.46 cm2
LVOT diameter: 21 mm
MV Dec: 85
MV pk E vel: 0.9 m/s
Weight: 3470.4 oz

## 2016-02-02 LAB — GLUCOSE, CAPILLARY
Glucose-Capillary: 198 mg/dL — ABNORMAL HIGH (ref 65–99)
Glucose-Capillary: 224 mg/dL — ABNORMAL HIGH (ref 65–99)
Glucose-Capillary: 450 mg/dL — ABNORMAL HIGH (ref 65–99)
Glucose-Capillary: 226 mg/dL — ABNORMAL HIGH (ref 65–99)

## 2016-02-02 LAB — TYPE AND SCREEN
ABO/RH(D): A POS
Antibody Screen: NEGATIVE
UNIT DIVISION: 0

## 2016-02-02 LAB — LIPID PANEL
CHOL/HDL RATIO: 2.8 ratio
CHOLESTEROL: 98 mg/dL (ref 0–200)
HDL: 35 mg/dL — AB (ref >40)
LDL Cholesterol: 41 mg/dL (ref 0–99)
Triglycerides: 109 mg/dL (ref <150)
VLDL: 22 mg/dL (ref 0–40)

## 2016-02-02 LAB — BASIC METABOLIC PANEL
ANION GAP: 12 (ref 5–15)
BUN: 58 mg/dL — AB (ref 6–20)
CALCIUM: 9.1 mg/dL (ref 8.9–10.3)
CO2: 19 mmol/L — AB (ref 22–32)
Chloride: 104 mmol/L (ref 101–111)
Creatinine, Ser: 1.85 mg/dL — ABNORMAL HIGH (ref 0.44–1.00)
GFR calc Af Amer: 31 mL/min — ABNORMAL LOW (ref >60)
GFR calc non Af Amer: 27 mL/min — ABNORMAL LOW (ref >60)
GLUCOSE: 286 mg/dL — AB (ref 65–99)
Potassium: 4.7 mmol/L (ref 3.5–5.1)
Sodium: 135 mmol/L (ref 135–145)

## 2016-02-02 LAB — TROPONIN I
TROPONIN I: 4.73 ng/mL — AB (ref <0.031)
TROPONIN I: 1.53 ng/mL — AB (ref <0.031)
Troponin I: 3.05 ng/mL (ref <0.031)

## 2016-02-02 LAB — HEPARIN LEVEL (UNFRACTIONATED)
HEPARIN UNFRACTIONATED: 0.12 [IU]/mL — AB (ref 0.30–0.70)
Heparin Unfractionated: 0.11 IU/mL — ABNORMAL LOW (ref 0.30–0.70)

## 2016-02-02 LAB — T3: T3, Total: 185 ng/dL — ABNORMAL HIGH (ref 71–180)

## 2016-02-02 MED ORDER — FENTANYL CITRATE (PF) 100 MCG/2ML IJ SOLN
50.0000 ug | INTRAMUSCULAR | Status: DC | PRN
  Administered 2016-02-02: 50 ug via INTRAVENOUS
  Filled 2016-02-02: qty 2

## 2016-02-02 MED ORDER — HYDROMORPHONE HCL 1 MG/ML IJ SOLN
0.5000 mg | INTRAMUSCULAR | Status: DC | PRN
  Administered 2016-02-02: 0.5 mg via INTRAVENOUS
  Filled 2016-02-02: qty 1

## 2016-02-02 MED ORDER — PERFLUTREN LIPID MICROSPHERE
1.0000 mL | INTRAVENOUS | Status: AC | PRN
  Administered 2016-02-02: 2 mL via INTRAVENOUS
  Filled 2016-02-02: qty 10

## 2016-02-02 MED ORDER — CLOPIDOGREL BISULFATE 75 MG PO TABS
75.0000 mg | ORAL_TABLET | Freq: Every day | ORAL | Status: DC
  Administered 2016-02-03: 75 mg via ORAL
  Filled 2016-02-02: qty 1

## 2016-02-02 MED ORDER — PERFLUTREN LIPID MICROSPHERE
INTRAVENOUS | Status: AC
  Filled 2016-02-02: qty 10

## 2016-02-02 MED ORDER — FENTANYL CITRATE (PF) 100 MCG/2ML IJ SOLN
25.0000 ug | INTRAMUSCULAR | Status: DC | PRN
  Administered 2016-02-02: 25 ug via INTRAVENOUS
  Filled 2016-02-02: qty 2

## 2016-02-02 MED ORDER — TORSEMIDE 20 MG PO TABS
80.0000 mg | ORAL_TABLET | Freq: Every day | ORAL | Status: DC
  Administered 2016-02-03: 80 mg via ORAL
  Filled 2016-02-02: qty 4

## 2016-02-02 MED ORDER — IPRATROPIUM-ALBUTEROL 0.5-2.5 (3) MG/3ML IN SOLN
3.0000 mL | RESPIRATORY_TRACT | Status: DC | PRN
  Filled 2016-02-02: qty 3

## 2016-02-02 MED ORDER — HEPARIN BOLUS VIA INFUSION
3000.0000 [IU] | Freq: Once | INTRAVENOUS | Status: AC
  Administered 2016-02-02: 3000 [IU] via INTRAVENOUS
  Filled 2016-02-02: qty 3000

## 2016-02-02 MED ORDER — CLOPIDOGREL BISULFATE 75 MG PO TABS
300.0000 mg | ORAL_TABLET | Freq: Once | ORAL | Status: AC
  Administered 2016-02-02: 300 mg via ORAL
  Filled 2016-02-02: qty 4

## 2016-02-02 NOTE — Progress Notes (Signed)
Family medicine Pre rounds  Anita [name] is a 70 y.o. female presenting with new onset chest pain, in setting of recent productive cough / dyspnea concern for COPD exacerbation. On admission her troponin was mildly elevated to 0.06 then trended up to 4.92>>4.43>>4.73. She denies any chest pressure or SOB with resolved O2 requirement. Her only complaint is continued chronic knee pain  BP 126/61 mmHg  Pulse 88  Temp(Src) 97.5 F (36.4 C) (Oral)  Resp 20  Wt 216 lb 14.4 oz (98.385 kg)  SpO2 99% Exam Gen: NAD, sitting in bedside chair CV: RRR Pulm: CTAB bilaterally, but poor inspiratory effort  A/P: concernfor NSTEMI, continud uptrending trops but no chest pain - NSTEMI- heparin drip, given again uptrending trops will start plavix 300 now then continue 75 qD - Anemia- s/p 1 unit pRBC hgb 8.3, transfuse again as needed to keep hgb> 8 - chronic knee pain-fentanyl PRN   Mark Hassey A. Lincoln Brigham MD, [facility] Resident PGY-2 Pager [phone]

## 2016-02-02 NOTE — Progress Notes (Signed)
Patient Name: Anita [name] Date of Encounter: 02/02/2016  Active Problems:   DM2 (diabetes mellitus, type 2) ([place])   COPD (chronic obstructive pulmonary disease) ([place])   Chronic respiratory failure (HCC)   Chronic diastolic heart failure (HCC)   CKD (chronic kidney disease), stage IV ([place])   History of tobacco abuse   Breast cancer metastasized to bone (HCC)   Venous stasis of lower extremity   COPD exacerbation (HCC)   Chest pain   Acute on chronic congestive heart failure ([place])   Elevated troponin   Primary Cardiologist: Dr. Percival Spanish Patient Profile: 70 y/o ? with a h/o CAD, COPD, diast CHF, CKD IV, Anemia, DM II, and breast cancer w/ bone mets currently on chemotherapy (monthly injections) who was admitted on 02/01/16 secondary to left chest pressure and bronchitis.  SUBJECTIVE: Denies chest pain and SOB. Has pain in her left knee.    OBJECTIVE Filed Vitals:   02/01/16 2318 02/02/16 0234 02/02/16 0500 02/02/16 0755  BP: 104/65 126/61  137/79  Pulse: 89 88  95  Temp: 97.7 F (36.5 C) 97.5 F (36.4 C)  97.8 F (36.6 C)  TempSrc: Oral Oral  Oral  Resp: 22 20  19   Weight:   216 lb 14.4 oz (98.385 kg)   SpO2: 98% 99%  98%    Intake/Output Summary (Last 24 hours) at 02/02/16 0913 Last data filed at 02/02/16 0900  Gross per 24 hour  Intake   1797 ml  Output   2050 ml  Net   -253 ml   Filed Weights   02/01/16 1233 02/02/16 0500  Weight: 212 lb 9.6 oz (96.435 kg) 216 lb 14.4 oz (98.385 kg)    PHYSICAL EXAM General: Well developed, well nourished, female in no acute distress. Head: Normocephalic, atraumatic.  Neck: Supple without bruits, No JVD. Lungs:  Resp regular and unlabored, Diffuse rales and rhonchi Heart: RRR, S1, S2, no S3, S4, or murmur; no rub. Abdomen: Soft, non-tender, non-distended, BS + x 4.  Extremities: No clubbing, cyanosis, Generalized edema, +1 pretibial edema.  Neuro: Alert and oriented X 3. Moves all extremities spontaneously. Psych:  Normal affect.  LABS: CBC: Recent Labs  02/01/16 0800 02/02/16 0308  WBC 4.9 5.2  HGB 7.6* 8.3*  HCT 24.6* 26.3*  MCV 89.5 88.6  PLT 243 [id number]   Basic Metabolic Panel: Recent Labs  02/01/16 0800 02/01/16 1309 02/02/16 0308  NA 136  --  135  K 4.1  --  4.7  CL 101  --  104  CO2 22  --  19*  GLUCOSE 390*  --  286*  BUN 59*  --  58*  CREATININE 2.08*  --  1.85*  CALCIUM 9.2  --  9.1  MG  --  2.4  --    Cardiac Enzymes: Recent Labs  02/01/16 1649 02/01/16 2122 02/02/16 0308  TROPONINI 4.92* 4.43* 4.73*    Recent Labs  02/01/16 0834  TROPIPOC 0.11*   Fasting Lipid Panel: Recent Labs  02/02/16 0308  CHOL 98  HDL 35*  LDLCALC 41  TRIG 109  CHOLHDL 2.8   Thyroid Function Tests: Recent Labs  02/01/16 1309  TSH <0.010*     Current facility-administered medications:  .  acetaminophen (TYLENOL) tablet 650 mg, 650 mg, Oral, Q4H PRN, Olin Hauser, DO, 650 mg at 02/02/16 0252 .  albuterol (PROVENTIL) (2.5 MG/3ML) 0.083% nebulizer solution 2.5 mg, 2.5 mg, Nebulization, Q2H PRN, Olin Hauser, DO .  aspirin EC tablet  81 mg, 81 mg, Oral, Daily, Devonne Doughty Karamalegos, DO, 81 mg at 02/02/16 [id number] .  atorvastatin (LIPITOR) tablet 40 mg, 40 mg, Oral, q1800, Olin Hauser, DO, 40 mg at 02/01/16 2305 .  docusate sodium (COLACE) capsule 100 mg, 100 mg, Oral, Daily, Olin Hauser, DO, 100 mg at 02/02/16 [id number] .  doxycycline (VIBRA-TABS) tablet 100 mg, 100 mg, Oral, Q12H, Olin Hauser, DO, 100 mg at 02/02/16 [id number] .  fentaNYL (SUBLIMAZE) injection 25 mcg, 25 mcg, Intravenous, Q2H PRN, Veatrice Bourbon, MD, 25 mcg at 02/02/16 0708 .  furosemide (LASIX) injection 40 mg, 40 mg, Intravenous, Daily, Alyssa A Haney, MD, 40 mg at 02/01/16 1506 .  heparin ADULT infusion 100 units/mL (25000 units/264mL sodium chloride 0.45%), 1,300 Units/hr, Intravenous, Continuous, Veronda P Bryk, RPH, Last Rate: 13 mL/hr at 02/02/16 0451, 1,300  Units/hr at 02/02/16 0451 .  insulin aspart (novoLOG) injection 0-20 Units, 0-20 Units, Subcutaneous, TID WC, Veatrice Bourbon, MD, 4 Units at 02/02/16 0853 .  insulin aspart (novoLOG) injection 0-5 Units, 0-5 Units, Subcutaneous, QHS, Veatrice Bourbon, MD, 10 Units at 02/01/16 2236 .  ipratropium-albuterol (DUONEB) 0.5-2.5 (3) MG/3ML nebulizer solution 3 mL, 3 mL, Nebulization, TID, Olin Hauser, DO, 3 mL at 02/01/16 2107 .  isosorbide mononitrate (IMDUR) 24 hr tablet 60 mg, 60 mg, Oral, Daily, Olin Hauser, DO, 60 mg at 02/02/16 [id number] .  metoprolol tartrate (LOPRESSOR) 25 mg/10 mL oral suspension 12.5 mg, 12.5 mg, Oral, BID, Rogelia Mire, NP, 12.5 mg at 02/02/16 0853 .  mirtazapine (REMERON) tablet 15 mg, 15 mg, Oral, QHS, Olin Hauser, DO, 15 mg at 02/01/16 2306 .  ondansetron (ZOFRAN) injection 4 mg, 4 mg, Intravenous, Q6H PRN, Olin Hauser, DO .  potassium chloride SA (K-DUR,KLOR-CON) CR tablet 40 mEq, 40 mEq, Oral, BID, Olin Hauser, DO, 40 mEq at 02/02/16 [id number] .  predniSONE (DELTASONE) tablet 50 mg, 50 mg, Oral, Q breakfast, Olin Hauser, DO, 50 mg at 02/02/16 [id number] . heparin 1,300 Units/hr (02/02/16 0451)   TELE:  NSR      ECG: NSR, prolonged QTC   Radiology/Studies: Dg Chest 2 View  02/01/2016  CLINICAL DATA:  Midline chest pain and productive cough which shortness-of-breath 2-3 weeks. COPD. EXAM: CHEST  2 VIEW COMPARISON:  01/20/2016 FINDINGS: Lungs are adequately inflated without focal consolidation or effusion. Minimal prominence of the right infrahilar bronchovascular markings. Mild stable cardiomegaly. Minimal calcified plaque over the aortic arch. There are mild degenerative changes of the spine. IMPRESSION: Minimal prominence of the right infrahilar bronchovascular markings which may be due to an acute bronchitic process. Electronically Signed   By: Marin Olp M.D.   On: 02/01/2016 09:03     Current  Medications:  . aspirin EC  81 mg Oral Daily  . atorvastatin  40 mg Oral q1800  . docusate sodium  100 mg Oral Daily  . doxycycline  100 mg Oral Q12H  . furosemide  40 mg Intravenous Daily  . insulin aspart  0-20 Units Subcutaneous TID WC  . insulin aspart  0-5 Units Subcutaneous QHS  . ipratropium-albuterol  3 mL Nebulization TID  . isosorbide mononitrate  60 mg Oral Daily  . metoprolol tartrate  12.5 mg Oral BID  . mirtazapine  15 mg Oral QHS  . potassium chloride SA  40 mEq Oral BID  . predniSONE  50 mg Oral Q breakfast   . heparin 1,300 Units/hr (02/02/16 0451)    ASSESSMENT AND PLAN:  Active Problems:   DM2 (diabetes mellitus, type 2) (HCC)   COPD (chronic obstructive pulmonary disease) (HCC)   Chronic respiratory failure (HCC)   Chronic diastolic heart failure (HCC)   CKD (chronic kidney disease), stage IV (HCC)   History of tobacco abuse   Breast cancer metastasized to bone (HCC)   Venous stasis of lower extremity   COPD exacerbation (HCC)   Chest pain   Acute on chronic congestive heart failure (HCC)   Elevated troponin  1. NSTEMI/CAD: Pt presented on 6//7/17 awaking with 7/10 chest pressure, eventually relieved with ASA in the ED. Initial troponin (poc) was mildly elevated @ 0.11 but has since risen to 3.36. She has a h/o CAD with cath in 06/2013 revealing moderate to severe proximal LAD dzs and an occluded RCA. She is currently chest pain free on asa, statin, and long-acting nitrate. As she is not actively wheezing and is tachycardic with suppressed TSH, beta blocker added yesterday.On heparin for now, but we will have to watch H/H closely in setting of slightly worsened anemia. Ms. Scheible prefers to avoid catheterization and given co-morbidities present currently including anemia (8.3/26.3), CKD IV with creat of 1.85 - slightly worse than previous, and possible hyperthyroidism, she is not an ideal candidate for cath at this time.Troponin elevated at 4.73 this am.    2. AECOPD/Brochitis: Recently started on prednisone and levaquin as outpt in setting of 2'ish wk h/o productive cough with chest and sinus congestion. QTc long on admission and levaquin d/c'd. No active wheezing. Mgmt per IM.  3. Anemia of chronic disease: H/H currently 8.3/26.3, down from 8.5/26.1 on 5/26. Anemia possibly contributing to demand ischemia, esp in setting of mod-severe LAD dzs, pt. Received 1 unit PRBC's yesterday.   4. Suppressed TSH: She has a h/o this with previously nl FT4. Not eval here in several years. She is not on replacement @ home. She notes that her HR's are always elevated. Further w/u per IM. Add ? blocker.  5. CKD IV: Creat up slightly above baseline. Follow.   6. HL: LDL 54 in 03/2015. Cont statin therapy.  7. Chronic diastolic CHF: previously nl EF by echo in 01/2014. Wt recorded as up 4 lbs since yesterday. BP stable. HR elevated. Exam challenging for volume overload though BNP nl, has diffuse rhonchi and rales in bases.Has refused IV Lasix per nursing stating 'this is not good for me". Will restart her home torsemide as she tells me she will not take Lasix.   8. Prolonged QT: In setting of levaquin. F/u ECG @ slightly slower HR w/ normalization of QT. QTC .46 this am.    Signed, Arbutus Leas , NP 9:13 AM 02/02/2016 Pager [phone]

## 2016-02-02 NOTE — Progress Notes (Signed)
Family Medicine Teaching Service Daily Progress Note Intern Pager: [phone]  Patient name: Anita [name] Medical record number: [id number] Date of birth: 10/10/1945 Age: 70 y.o. Gender: female  Primary Care Provider: [name], DO Consultants: Cardiology Code Status: Full  Pt Overview and Major Events to Date:  6/7: Admitted with chest pain and dyspnea  Assessment and Plan: Anita [name] is a 70 y.o. female presenting with new onset chest pain, in setting of recent productive cough / dyspnea concern for COPD exacerbation. 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  Chest pain, rule out MI, with history of CAD s/p prior NSTEMI: Treating as NSTEMI given elevated troponins. Consistent with mixed atypical / typical chest pain new onset day of presentation, most likely related to recent illness with persistent cough and possible AECOPD. Significant cardiac history, Heart Score 5, relieved with ASA in ED, elevated troponin on initial test in ED, EKG without ischemic changes. Troponins 0.06 > 3.36 > 4.92 > 4.43 > 4.73 > 3.05. Repeat EKG unchanged. Hyperthyroidism could also be cause of extra demand. Hgb of 7.6 may also have been contributing.  - On continuous cardiac monitoring.  - S/p ASA 325 in ED - Cardiology consulted in ED, recommending starting heparin drip, metoprolol, and considering plavix. --> Asked for further recommendations on starting and continuing plavix and duration of heparin drip, especially if patient loses IV access. - Resume ASA 81 today, continue statin, start metoprolol 12.5 mg BID - Continue to cycle troponins until stabilizes  - Repeat ECHO, last 01/2014, as heart failure could be cause of elevated troponins - Risk stratification labs lipids and TSH, last A1c 6.7 (11/2015) -->  LDL 41  Dyspnea / Productive Cough, presumed AECOPD, in setting chronic respiratory failure Recently treated outpatient for AECOPD, however did not complete antibiotic / prednisone course only x 1 day treatment. Clinically now without hypoxia and no crackles on exam. CXR R>L lower more likely bronchitis consider potential infiltrate. Considered potential PE with tachycardia and some dyspnea, inc risk on anti-estrogen treatment for metastatic breast cancer, Well's Score 2.5 (still unlikely risk). Patient refusing further doses of lasix.  - S/p Lasix 40mg  IV in ED - DC'd Levaquin. Started doxycycline 100mg  BID x 7 days on 6/7 - Resume Prednisone 50mg  daily x 5 days - Duoneb q 4 hour PRN - Supplemental O2 PRN  Prolonged QTc >600: Improved to 460 after discontinuing levaquin Recently treated with Levaquin for COPD, only received x 1 dose 6/6 - Discontinued Levaquin - Mag 2.4 - Repeat EKG improved  Chronic Diastolic CHF, without clear evidence of acute exacerbation Clinically without significant evidence of volume overload on exam and CTAB today, though with decreased air movement. On admission was nearly at dry wt 209 lbs per prior cardiology note 08/2015. However, weight was 205 on 6/2. BNP mild elevated 125, prior has been higher. Last ECHO 55% EF in 2015. - S/p Lasix 40mg  IV in ED - Weight 212.6 lb on admission, 216.9 today.  - Restart home torsemide 120 mg daily - ECHO ordered - Check daily wt, strict I/O  Hyperthyroidism: TSH < 0.010. Free T4 1.96. T3, total 185.Patient reports occasional palpitations and increased fatigue.  - Rate control with metoprolol  Left knee pain: - Obtain 4-view xray to rule out metastasis - Fentanyl 25 mcg q2h prn for pain -  Tylenol 650 mg q4h prn for pain  Anemia: Hgb 7.6 on admission (Baseline ~8.5). No known source of bleeding. - s/p 1 unit pRBCs 6/7 - Improved to 8.3 - Monitor with daily CBCs  Metastatic breast cancer to bone/lung, on chronic  anti-estrogen therapy Stable, dx initially 2014. Followed by Oncology. On Ibrance anti-estrogen therapy.  CKD-IV B/l Cr 1.8 - 2.0, near baseline today at 1.85, elevated BUN clinically mildly dry also with lower BP - Follow Cr, volume status  Diabetes, Type 2 Last A1c 6.7 (11/2015). Not on medication. Concern given prednisone now. - Moderate SSI, no night time coverage, no meal time - CBG checks q AC  Chronic venous stasis, bilateral lower ext - Consulted wound care --> ACE compression wraps - May need unna boots in future  FEN/GI: SLIV, heart diet Prophylaxis: Lovenox SQ per pharmacy with reduced GFR  Disposition: Continue on telemetry inpatient for chest pain rule out MI, cardiology consult, possible acute CHF vs acute COPD etiology of dyspnea. Anticipate discharge in 1-2 days.   Subjective:  Patient denies recurrence of chest pain. She denies shortness of breath. She continues to have sharp knee pains that come and go.   Objective: Temp:  [97.3 F (36.3 C)-98.1 F (36.7 C)] 97.5 F (36.4 C) (06/08 0234) Pulse Rate:  [88-115] 88 (06/08 0234) Resp:  [20-26] 20 (06/08 0234) BP: (100-126)/(51-71) 126/61 mmHg (06/08 0234) SpO2:  [95 %-100 %] 99 % (06/08 0234) Weight:  [212 lb 9.6 oz (96.435 kg)-216 lb 14.4 oz (98.385 kg)] 216 lb 14.4 oz (98.385 kg) (06/08 0500) Physical Exam: General: chronically ill 70 yr female, laying in bed, in NAD Neck: supple without significant JVD Cardiovascular: RRR, no murmurs, intact +2 distal pulses Respiratory: CTAB, speaks full short sentences, no supplemental O2 Abdomen: soft, obese, non-tender, non-distended, +active BS Skin: warm, dry, bilateral lower ext with ACE wraps in place Neuro: grossly intact and non-focal, AOx3 Psych: normal mood and affect  Laboratory:  Recent Labs Lab 02/01/16 0800 02/02/16 0308  WBC 4.9 5.2  HGB 7.6* 8.3*  HCT 24.6* 26.3*  PLT 243 247    Recent Labs Lab 02/01/16 0800  NA 136  K 4.1  CL 101  CO2 22   BUN 59*  CREATININE 2.08*  CALCIUM 9.2  GLUCOSE 390*   Imaging/Diagnostic Tests: Dg Chest 2 View  02/01/2016  CLINICAL DATA:  Midline chest pain and productive cough which shortness-of-breath 2-3 weeks. COPD. EXAM: CHEST  2 VIEW COMPARISON:  01/20/2016 FINDINGS: Lungs are adequately inflated without focal consolidation or effusion. Minimal prominence of the right infrahilar bronchovascular markings. Mild stable cardiomegaly. Minimal calcified plaque over the aortic arch. There are mild degenerative changes of the spine. IMPRESSION: Minimal prominence of the right infrahilar bronchovascular markings which may be due to an acute bronchitic process. Electronically Signed   By: Marin Olp M.D.   On: 02/01/2016 09:03    Rogue Bussing, MD 02/02/2016, 7:09 AM PGY-1, [place] Intern pager: [phone], text pages welcome

## 2016-02-02 NOTE — Plan of Care (Signed)
Problem: Education: Goal: Knowledge of [facility] Education information/materials will improve Outcome: Progressing Patient aware of plan of care.  RN provided medication education to patient on all medications administered thus far this shift.  Patient stated understanding.  Patient did complain of knee pain this shift, PRN tylenol given per MD order (see flowsheet and MAR for detailed information).

## 2016-02-02 NOTE — Telephone Encounter (Signed)
Patient called reporting "I'm doing better.  They're looking at all options.  Radiology test to my knee, a CXR and more test done to rule out pneumonia, COPD or something."

## 2016-02-02 NOTE — Progress Notes (Signed)
ANTICOAGULATION CONSULT NOTE - Follow Up Consult  Pharmacy Consult for heparin Indication: chest pain/ACS/NSTEMI  Allergies  Allergen Reactions  . Omnipaque [Iohexol] Hives and Other (See Comments)    Pt claims she developed hives after given contrast  . Benzene Rash    Patient Measurements: Weight: 216 lb 14.4 oz (98.385 kg) Heparin Dosing Weight: 72.4 kg  Vital Signs: Temp: 98.3 F (36.8 C) (06/08 1145) Temp Source: Oral (06/08 1145) BP: 118/60 mmHg (06/08 1145) Pulse Rate: 110 (06/08 1145)  Labs:  Recent Labs  02/01/16 0800  02/01/16 2122 02/02/16 0308 02/02/16 0844 02/02/16 1218  HGB 7.6*  --   --  8.3*  --   --   HCT 24.6*  --   --  26.3*  --   --   PLT 243  --   --  247  --   --   HEPARINUNFRC  --   --   --  0.12*  --  0.11*  CREATININE 2.08*  --   --  1.85*  --   --   TROPONINI 0.06*  < > 4.43* 4.73* 3.05*  --   < > = values in this interval not displayed.  Estimated Creatinine Clearance: 31.9 mL/min (by C-G formula based on Cr of 1.85).   Medications:  See medical record  Assessment: 71yo female subtherapeutic on heparin with initial dosing for NSTEMI; of note pt lost IV access for several hours this morning and heparin was just restarted prior to most recent level of 0.11. Will disregard this level and check another level this evening. Will notify RN to please call pharmacy if access is lost again. Pt received PRBC x 1 overnight and hgb up a bit today. Plt wnl. No bleeding noted.  Goal of Therapy:  Heparin level 0.3-0.7 units/ml Monitor platelets by anticoagulation protocol: Yes   Plan:  Continue heparin infusion at 1300 units/hr Check anti-Xa level in 8 hours and daily while on heparin Continue to monitor H&H and platelets  Thank you for allowing [place] to participate in this patients care. Jens Som, PharmD Pager: [phone] 02/02/2016,1:18 PM

## 2016-02-02 NOTE — Progress Notes (Signed)
Echocardiogram 2D Echocardiogram with Definity has been performed.  Tresa Res 02/02/2016, 12:28 PM

## 2016-02-02 NOTE — Telephone Encounter (Signed)
Pt wants to speak Dr. Nori Riis about her knee xray.  Advised that I would send a message to her PCP Dr. Juleen China.  Pt states that Dr. Nori Riis, told her that she "would give me a shot in her knee if it was arthritis"   Pt is inpatient at this time, she can be reached @ [phone]. [place], Salome Spotted, CMA

## 2016-02-02 NOTE — Consult Note (Signed)
WOC wound consult note Reason for Consult: chronic venous stasis Patient reports she has been followed in the wound care center in the past.  Recently she reporting having compression wraps applied and changed weekly at the wound care center. They were DCed and she has a pair of Juxta Light compression wraps. She "does not recommend them" and "I want a pair with the zipper".  Bilateral LE venous dermatitis with evidence of excoriation, patient does admit to scratching the areas. 1+ pitting edema bilaterally and palpable pulses bilaterally.  Wound type: venous stasis ulceration, small left pretibial region and left medial malleolar  Pressure Ulcer POA: No Measurement: small medial area partial thickness opening 1.0cm x 2.5cm x 0.2cm  Wound bed: pink, moist Drainage (amount, consistency, odor) minimal, serosanguinous  Periwound: venous dermatitis  Dressing procedure/placement/frequency: Apply non adherent dressing over open areas on the LLE.  Orthopedic tech to apply bilateral Unna's boots.    Pt will need HHRN or follow up in wound care center next midweek for Unna's boot change and reassessment of the LLE wounds.   Discussed POC with patient and bedside nurse.  Re consult if needed, will not follow at this time. Thanks  Anita [name], [place] [phone])

## 2016-02-02 NOTE — Progress Notes (Signed)
ANTICOAGULATION CONSULT NOTE - Follow Up Consult  Pharmacy Consult for heparin Indication: NSTEMI   Labs:  Recent Labs  02/01/16 0800 02/01/16 1309 02/01/16 1649 02/01/16 2122 02/02/16 0308  HGB 7.6*  --   --   --  8.3*  HCT 24.6*  --   --   --  26.3*  PLT 243  --   --   --  247  HEPARINUNFRC  --   --   --   --  0.12*  CREATININE 2.08*  --   --   --   --   TROPONINI 0.06* 3.36* 4.92* 4.43*  --      Assessment: 69yo female subtherapeutic on heparin with initial dosing for NSTEMI; of note pt had pulled IV line and heparin was off ~1hr.  Goal of Therapy:  Heparin level 0.3-0.7 units/ml   Plan:  Will rebolus with heparin 3000 units and increase gtt by 3 units/kg/hr to 1300 units/hr and check level in [place], PharmD, BCPS  02/02/2016,4:16 AM

## 2016-02-02 NOTE — Telephone Encounter (Signed)
"  I want to let Dr. Burr Medico know I was admitted yesterday Morning.  I am at [facility], Room [phone].

## 2016-02-03 ENCOUNTER — Telehealth: Payer: Self-pay | Admitting: Cardiology

## 2016-02-03 DIAGNOSIS — I214 Non-ST elevation (NSTEMI) myocardial infarction: Secondary | ICD-10-CM | POA: Diagnosis not present

## 2016-02-03 DIAGNOSIS — I5043 Acute on chronic combined systolic (congestive) and diastolic (congestive) heart failure: Secondary | ICD-10-CM

## 2016-02-03 DIAGNOSIS — I5032 Chronic diastolic (congestive) heart failure: Secondary | ICD-10-CM

## 2016-02-03 DIAGNOSIS — R079 Chest pain, unspecified: Secondary | ICD-10-CM | POA: Diagnosis not present

## 2016-02-03 DIAGNOSIS — J9611 Chronic respiratory failure with hypoxia: Secondary | ICD-10-CM | POA: Diagnosis not present

## 2016-02-03 DIAGNOSIS — C50919 Malignant neoplasm of unspecified site of unspecified female breast: Secondary | ICD-10-CM | POA: Diagnosis not present

## 2016-02-03 DIAGNOSIS — N184 Chronic kidney disease, stage 4 (severe): Secondary | ICD-10-CM | POA: Diagnosis not present

## 2016-02-03 DIAGNOSIS — R0789 Other chest pain: Secondary | ICD-10-CM

## 2016-02-03 LAB — CBC
HEMATOCRIT: 28.3 % — AB (ref 36.0–46.0)
Hemoglobin: 9 g/dL — ABNORMAL LOW (ref 12.0–15.0)
MCH: 28.1 pg (ref 26.0–34.0)
MCHC: 31.8 g/dL (ref 30.0–36.0)
MCV: 88.4 fL (ref 78.0–100.0)
Platelets: 282 10*3/uL (ref 150–400)
RBC: 3.2 MIL/uL — AB (ref 3.87–5.11)
RDW: 18.4 % — AB (ref 11.5–15.5)
WBC: 7.6 10*3/uL (ref 4.0–10.5)

## 2016-02-03 LAB — LIPID PANEL
CHOL/HDL RATIO: 2.6 ratio
Cholesterol: 109 mg/dL (ref 0–200)
HDL: 42 mg/dL (ref >40)
LDL CALC: 45 mg/dL (ref 0–99)
Triglycerides: 112 mg/dL (ref <150)
VLDL: 22 mg/dL (ref 0–40)

## 2016-02-03 LAB — GLUCOSE, CAPILLARY
GLUCOSE-CAPILLARY: 140 mg/dL — AB (ref 65–99)
Glucose-Capillary: 208 mg/dL — ABNORMAL HIGH (ref 65–99)

## 2016-02-03 LAB — BASIC METABOLIC PANEL
ANION GAP: 9 (ref 5–15)
BUN: 47 mg/dL — AB (ref 6–20)
CHLORIDE: 107 mmol/L (ref 101–111)
CO2: 22 mmol/L (ref 22–32)
Calcium: 9.4 mg/dL (ref 8.9–10.3)
Creatinine, Ser: 1.5 mg/dL — ABNORMAL HIGH (ref 0.44–1.00)
GFR calc Af Amer: 40 mL/min — ABNORMAL LOW (ref >60)
GFR calc non Af Amer: 35 mL/min — ABNORMAL LOW (ref >60)
Glucose, Bld: 132 mg/dL — ABNORMAL HIGH (ref 65–99)
POTASSIUM: 4.9 mmol/L (ref 3.5–5.1)
SODIUM: 138 mmol/L (ref 135–145)

## 2016-02-03 MED ORDER — METOPROLOL TARTRATE 12.5 MG HALF TABLET
12.5000 mg | ORAL_TABLET | Freq: Once | ORAL | Status: AC
  Administered 2016-02-03: 12.5 mg via ORAL
  Filled 2016-02-03: qty 1

## 2016-02-03 MED ORDER — METOPROLOL TARTRATE 25 MG PO TABS: 25.0000 mg | ORAL_TABLET | Freq: Two times a day (BID) | ORAL | Status: DC

## 2016-02-03 MED ORDER — DOXYCYCLINE HYCLATE 100 MG PO TABS: 100.0000 mg | ORAL_TABLET | Freq: Two times a day (BID) | ORAL | Status: DC

## 2016-02-03 MED ORDER — HYDROMORPHONE HCL 2 MG PO TABS: 2.0000 mg | ORAL_TABLET | ORAL | Status: AC | PRN

## 2016-02-03 MED ORDER — TORSEMIDE 20 MG PO TABS: 40.0000 mg | ORAL_TABLET | Freq: Every day | ORAL | Status: DC

## 2016-02-03 MED ORDER — HYDROMORPHONE HCL 1 MG/ML IJ SOLN: 0.5000 mg | Freq: Four times a day (QID) | INTRAMUSCULAR | Status: DC | PRN

## 2016-02-03 MED ORDER — CLOPIDOGREL BISULFATE 75 MG PO TABS: 75.0000 mg | ORAL_TABLET | Freq: Every day | ORAL | Status: DC

## 2016-02-03 MED ORDER — PREDNISONE 50 MG PO TABS: 50.0000 mg | ORAL_TABLET | Freq: Every day | ORAL | Status: DC

## 2016-02-03 MED ORDER — ACETAMINOPHEN 325 MG PO TABS: 650.0000 mg | ORAL_TABLET | ORAL | Status: AC | PRN

## 2016-02-03 MED ORDER — TORSEMIDE 20 MG PO TABS: 20.0000 mg | ORAL_TABLET | ORAL | Status: DC

## 2016-02-03 NOTE — Evaluation (Signed)
Physical Therapy Evaluation and D/C Patient Details Name: Anita [name] MRN: [id number] DOB: 03-06-1946 Today's Date: 02/03/2016   History of Present Illness  70 y/o ? with a h/o CAD, COPD, diast CHF, CKD IV, Anemia, DM II, and breast cancer w/ bone mets currently on chemotherapy (monthly injections) who was admitted on 02/01/16 secondary to left chest pressure and bronchitis.   Clinical Impression  Pt admitted with above diagnosis. Pt currently with functional limitations due to the deficits listed below (see PT Problem List). Appears pt is at baseline status.  Recommended PT and OT for home safety evals. Hope pt will agree.  Pt has all equipment but admits she probably won't use it.  Pt with poor overall safety awareness. Pt refused to wear O2 but it never got below 90% with pt DOE 3/4 at end of walk.  Will not follow as pt going home today per MD.  Pt will benefit from skilled PT at home to increase their independence and safety with mobility.      Follow Up Recommendations Home health PT (HHOT and [facility])    Equipment Recommendations  None recommended by PT    Recommendations for Other Services       Precautions / Restrictions Precautions Precautions: Fall Restrictions Weight Bearing Restrictions: No      Mobility  Bed Mobility Overal bed mobility: Independent                Transfers Overall transfer level: Independent                  Ambulation/Gait Ambulation/Gait assistance: Supervision Ambulation Distance (Feet): 125 Feet Assistive device: Rolling walker (2 wheeled) Gait Pattern/deviations: Decreased stride length;Step-through pattern;Steppage;Drifts right/left;Trunk flexed;Wide base of support   Gait velocity interpretation: Below normal speed for age/gender General Gait Details: Pt ambulates without the RW with impulsivity but no LOB in room.  Pt was safer using RW but pt states she doesnt' need it and doubt that pt will use it at home.  Pt is most  likely at her baseline.  She refuses much of what is recommended.  High fall risk secondary to her impulsivitiy and poor safety aawareness.    Stairs            Wheelchair Mobility    Modified Rankin (Stroke Patients Only)       Balance Overall balance assessment: Needs assistance;History of Falls Sitting-balance support: No upper extremity supported;Feet supported Sitting balance-Leahy Scale: Good     Standing balance support: Bilateral upper extremity supported;During functional activity Standing balance-Leahy Scale: Fair Standing balance comment: pt can stand statically and maintain balance.                              Pertinent Vitals/Pain Pain Assessment: No/denies pain  Sats at rest off O2 was 93%.  90% O2 with ambulation on RA.     Home Living Family/patient expects to be discharged to:: Private residence Living Arrangements: Spouse/significant other;Children Available Help at Discharge: Family;Available PRN/intermittently (husband works Warehouse manager and daughter comes every other day) Type of Home: House Home Access: [place]: One level Home Equipment: [place] - quad;Bedside commode;Wheelchair - [employer] - 4 wheels;Shower seat      Prior Function Level of Independence: Needs assistance   Gait / Transfers Assistance Needed: States she uses cane or no device.  ADL's / Homemaking Assistance Needed: Assist needed for bathing only per pt  Comments: takes SCAT to appointments and grocery store, attends classes at [employer]: Right    Extremity/Trunk Assessment   Upper Extremity Assessment: Defer to OT evaluation           Lower Extremity Assessment: Generalized weakness      Cervical / Trunk Assessment: Kyphotic  Communication   Communication: No difficulties  Cognition Arousal/Alertness: Lethargic Behavior During Therapy: Restless;Anxious;Impulsive Overall Cognitive Status:  History of cognitive impairments - at baseline                      General Comments General comments (skin integrity, edema, etc.): Nursing reports earlier pt stood and urinated in floor and was using bedside rolling table to balance. Pt with poor overall safety awareness but did not see this in my evaluation. Given pts reports of home situation, feel she is most likely at baseline.  Offered HHPT and HHOT as well as HHRN and pt declines services.      Exercises        Assessment/Plan    PT Assessment All further PT needs can be met in the next venue of care  PT Diagnosis Generalized weakness   PT Problem List Decreased balance;Decreased mobility  PT Treatment Interventions Balance training;Functional mobility training;DME instruction;Gait training   PT Goals (Current goals can be found in the Care Plan section) Acute Rehab PT Goals Patient Stated Goal: to go home today PT Goal Formulation: All assessment and education complete, DC therapy    Frequency     Barriers to discharge Decreased caregiver support      Co-evaluation               End of Session Equipment Utilized During Treatment: Gait belt Activity Tolerance: Patient limited by fatigue Patient left: in chair;with call bell/phone within reach;with chair alarm set Nurse Communication: Mobility status         Time: 1445-1506 PT Time Calculation (min) (ACUTE ONLY): 21 min   Charges:   PT Evaluation $PT Eval Moderate Complexity: 1 Procedure     PT G CodesDenice Paradise 02/05/2016, 3:47 PM Hamzah [facility] Acute Rehabilitation [phone] (pager)

## 2016-02-03 NOTE — Telephone Encounter (Signed)
New message     TCM appt on 6.19.2017 with Truitt Merle - per Junie Panning.

## 2016-02-03 NOTE — Discharge Summary (Signed)
[facility] Discharge Summary  Patient name: Anita [name] Medical record number: [id number] Date of birth: [birth year] Age: 70 y.o. Gender: female Date of Admission: 02/01/2016  Date of Discharge: 02/03/2016 Admitting Physician: Dickie La, MD  Primary Care Provider: [name], DO Consultants: Cardiology  Indication for Hospitalization: chest pain  Discharge Diagnoses/Problem List:  Active Problems:   DM2 (diabetes mellitus, type 2)    Chronic diastolic heart failure    CKD (chronic kidney disease), stage IV   History of tobacco abuse   Breast cancer metastasized to bone    Venous stasis of lower extremity   COPD exacerbation    Chest pain   Elevated troponin   NSTEMI (non-ST elevated myocardial infarction)    Disposition: Home  Discharge Condition: Improved  Discharge Exam:  Blood pressure 113/70, pulse 101, temperature 98.9 F (37.2 C), temperature source Oral, resp. rate 18, weight 216 lb 8 oz (98.204 kg), SpO2 95 %. Physical Exam: General: chronically ill 70 yr female, laying in bed, in NAD Neck: supple without significant JVD Cardiovascular: RRR, no murmurs, intact +2 distal pulses Respiratory: CTAB, speaks full short sentences, no supplemental O2 Abdomen: soft, obese, non-tender, non-distended, +active BS Skin: warm, dry, bilateral lower ext with ACE wraps in place Neuro: grossly intact and non-focal, AOx3 Psych: normal mood and affect  Brief Hospital Course:  Anita [name] is a 70 y.o. female who presented with new onset chest pain, in setting of recent productive cough / dyspnea concern for COPD exacerbation. PMH is significant for COPD with chronic respiratory failure occasionally on home O2 PRN, active smoker, chronic diastolic CHF, h/o CAD s/p NSTEMI, DM2, HTN, HLD, obesity, CKD-IV, metastatic breast cancer to bone/lung on anti-estrogen therapy.  NTSTEMI/CAD Pain relieved with aspirin in the emergency department. Initial and  repeat EKG without ischemic changes. Troponins trended up: 0.06 > 3.36 > 4.92 > 4.43 > 4.73 > 3.05 > 1.53. Cardiology was consulted and recommended heparin drip and starting metoprolol and plavix. Heparin was discontinued after less than 48 hours, as patient refused. Risk stratification labs showed TSH of <0.010, last A1c 6.7 in April, and LDL of 41. She was chest pain free since shortly after admission. She is not interested nor a good candidate for catheterization with her other comorbidities, so medical management with aspirin, plavix, atorvastatin, and Imdur all continued at discharge.   AECOPD vs. Bronchitis Patient was recently treated in the outpatient setting for COPD exacerbation but had just started 1 day of treatment with levaquin. She was without hypoxia or crackles on exam but continues to have productive cough x 3 weeks. On CXR, there were R>L bronchovascular markings suggestive of acute bronchitis. She had prolonged QTc on EKG that improved after switching to doxycycline. She was discharged to complete 7 day course of levaquin (4 days left) and 5 day course of prednisone (2 days left). She never required supplemental O2.   Chronic diastolic CHF: Last ECHO [id number] EF in 2015. Repeat ECHO with LVEF 25-30%, G2DD on 02/02/16. Breast cancer treatment has been with aromatase inhibitor and anti-estrogen therapy, neither of which are known to be especially cardiotoxic. On admission, weight was nearly at dry wt of 209 lbs per prior cardiology note 08/2015. However, weight was 205 on 6/2. BNP mild elevated to 125, prior has been higher. She received 1 dose of IV lasix but refused further IV diuresis. Home torsemide 120 mg daily was continued. Weight at discharge was 216 lbs. Recommend outpatient follow-up with  CHF clinic.   Metabolic Breast Cancer: Patient complained of severe left lower leg pain. 4-view xray was obtained to rule-out metastasis. Imaging showed a new large lucency of left proximal tibia,  concerning for lytic lesion. She was discharged with prescription for dilaudid (#60 tablets) for pain.   Anemia of chronic disease Hemoglobin was 7.6 on admission, decreased from baseline of ~8.5. No known source of bleeding. She received 1 unit pRBCs, and hemoglobin was stable at 9.0 upon discharge.   Hyperthyroidism TSH < 0.010. Free T4 1.96. T3, total 185. Patient reports occasional palpitations and increased fatigue. Provided rate control with addition of metoprolol.   Chronic Venous Stasis, bilateral lower extremities Home health nursing ordered upon discharge for unna boot change.    Issues for Follow Up:  1. Medication adherence with addition of plavix and metoprolol.  2. CHF clinic referral for reduced EF on ECHO.  3. Inquire about pain control with PO dilaudid  Significant Procedures: ECHO on 02/02/2016  Significant Labs and Imaging:   Recent Labs Lab 02/01/16 0800 02/02/16 0308 02/03/16 0942  WBC 4.9 5.2 7.6  HGB 7.6* 8.3* 9.0*  HCT 24.6* 26.3* 28.3*  PLT 243 247 282    Recent Labs Lab 02/01/16 0800 02/01/16 1309 02/02/16 0308 02/03/16 0435  NA 136  --  135 138  K 4.1  --  4.7 4.9  CL 101  --  104 107  CO2 22  --  19* 22  GLUCOSE 390*  --  286* 132*  BUN 59*  --  58* 47*  CREATININE 2.08*  --  1.85* 1.50*  CALCIUM 9.2  --  9.1 9.4  MG  --  2.4  --   --    Dg Knee Complete 4 Views Left  02/02/2016  CLINICAL DATA:  Painful left knee EXAM: LEFT KNEE - COMPLETE 4+ VIEW COMPARISON:  None. FINDINGS: Mild medial compartment narrowing. No joint effusion. There is a large lucent lesion identified within the proximal left tibia which measures 3.4 x 3.2 cm. The margins of this lesion are poorly defined. No overlying periosteal reaction. No fracture. IMPRESSION: 1. Large lucent lesion within the proximal tibia. In this patient who has a history of metastatic breast cancer with known bone metastasis this is suspicious for a lytic bone lesion. Electronically Signed   By:  Kerby Moors M.D.   On: 02/02/2016 15:27   Results/Tests Pending at Time of Discharge: None  Discharge Medications:    Medication List    TAKE these medications        acetaminophen 325 MG tablet  Commonly known as:  TYLENOL  Take 2 tablets (650 mg total) by mouth every 4 (four) hours as needed for headache or mild pain.     albuterol 108 (90 Base) MCG/ACT inhaler  Commonly known as:  PROVENTIL HFA;VENTOLIN HFA  Inhale 2 puffs into the lungs every 6 (six) hours as needed for wheezing or shortness of breath.     aspirin 81 MG EC tablet  Take 1 tablet (81 mg total) by mouth daily.     atorvastatin 40 MG tablet  Commonly known as:  LIPITOR  TAKE 1 TABLET EVERY DAY     bag balm Oint ointment  Apply 1 g topically as needed for dry skin or irritation.     clopidogrel 75 MG tablet  Commonly known as:  PLAVIX  Take 1 tablet (75 mg total) by mouth daily.     clotrimazole 1 % cream  Commonly known as:  LOTRIMIN  Apply 1 application topically daily.     docusate sodium 100 MG capsule  Commonly known as:  COLACE  Take 100 mg by mouth daily.     doxycycline 100 MG tablet  Commonly known as:  VIBRA-TABS  Take 1 tablet (100 mg total) by mouth every 12 (twelve) hours.     FASLODEX 250 MG/5ML injection  Generic drug:  fulvestrant  Inject 250 mg into the muscle every 30 (thirty) days. Cancer center     HYDROmorphone 2 MG tablet  Commonly known as:  DILAUDID  Take 1 tablet (2 mg total) by mouth every 4 (four) hours as needed for severe pain.     isosorbide mononitrate 60 MG 24 hr tablet  Commonly known as:  IMDUR  Take 1 tablet (60 mg total) by mouth daily.     metoprolol tartrate 25 MG tablet  Commonly known as:  LOPRESSOR  Take 1 tablet (25 mg total) by mouth 2 (two) times daily.     mirtazapine 15 MG tablet  Commonly known as:  REMERON  Take 1 tablet (15 mg total) by mouth at bedtime.     nitroGLYCERIN 0.4 MG SL tablet  Commonly known as:  NITROSTAT  Place 1 tablet  (0.4 mg total) under the tongue every 5 (five) minutes as needed for chest pain.     NON FORMULARY  Place 3 L into the nose as needed (oxygen).     palbociclib 100 MG capsule  Commonly known as:  IBRANCE  Take 1 capsule (100 mg total) by mouth daily with breakfast. Take whole with food.     potassium chloride SA 20 MEQ tablet  Commonly known as:  K-DUR,KLOR-CON  Take 2 tablets (40 mEq total) by mouth 2 (two) times daily.     predniSONE 50 MG tablet  Commonly known as:  DELTASONE  Take 1 tablet (50 mg total) by mouth daily with breakfast.     torsemide 20 MG tablet  Commonly known as:  DEMADEX  Take 1-2 tablets (20-40 mg total) by mouth See admin instructions. 80 mg in the morning 40 mg at night     VITAMIN C PO  Take 500 mg by mouth daily.     VITAMIN D (CHOLECALCIFEROL) PO  Take 1,000 mg by mouth daily.     XGEVA 120 MG/1.7ML Soln injection  Generic drug:  denosumab  Inject 120 mg into the skin once. [facility] at [facility]        Discharge Instructions: Please refer to Patient Instructions section of EMR for full details.  Patient was counseled important signs and symptoms that should prompt return to medical care, changes in medications, dietary instructions, activity restrictions, and follow up appointments.   Follow-Up Appointments:     Follow-up Information    Follow up with Truitt Merle, NP On 02/13/2016.   Specialties:  Nurse Practitioner, Interventional Cardiology, Cardiology, Radiology   Why:  at 8 am for follow up.    Contact information:   [address] [place] [address] [phone]       Follow up with [name], DO. Go on 02/09/2016.   Specialty:  Family Medicine   Why:  2:30 pm appointment for hospital follow-up   Contact information:   [place] [address] [phone]       [place] Buss[place], MD 02/03/2016, 3:26 PM PGY-1, [place]

## 2016-02-03 NOTE — Consult Note (Signed)
   [facility] CM Inpatient Consult   02/03/2016  [place] [name] 1945/11/27 [id number]   Patient screened for [name] [facility] services. Went to bedside. However, she was sleeping soundly in the chair. Will come back at another time to speak with her about [place] [facility] program. Made inpatient RNCM aware.  Marthenia Rolling, MSN-Ed, RN,BSN [facility] Liaison [phone]

## 2016-02-03 NOTE — Consult Note (Signed)
   [facility] CM Inpatient Consult   02/03/2016  Anita [name] [birth year] [id number]   Went back to room when patient was awake. Discussed and offered [facility] Care Management program. Anita [name] states " I do not want none of that". She declined [facility] Care Management program. Left brochure with contact information to contact should she change her mind. Made inpatient RNCM aware that Anita [name] declined [place] Management program.  Marthenia Rolling, [place], RN,BSN [facility] Liaison [phone]

## 2016-02-03 NOTE — Progress Notes (Signed)
Patient Name: Anita [name] Date of Encounter: 02/03/2016  Active Problems:   DM2 (diabetes mellitus, type 2) ([place])   COPD (chronic obstructive pulmonary disease) ([place])   Chronic respiratory failure (HCC)   Chronic diastolic heart failure (HCC)   CKD (chronic kidney disease), stage IV ([place])   History of tobacco abuse   Breast cancer metastasized to bone (HCC)   Venous stasis of lower extremity   COPD exacerbation (HCC)   Chest pain   Acute on chronic congestive heart failure (HCC)   Elevated troponin   NSTEMI (non-ST elevated myocardial infarction) ([place])   Pain in the chest   Primary Cardiologist: Dr. Percival Spanish Patient Profile:70 y/o ? with a h/o CAD, COPD, diast CHF, CKD IV, Anemia, DM II, and breast cancer w/ bone mets currently on chemotherapy (monthly injections) who was admitted on 02/01/16 secondary to left chest pressure and bronchitis.   SUBJECTIVE: Denies chest pain and SOB. Wants to go home.   OBJECTIVE Filed Vitals:   02/02/16 2212 02/03/16 0000 02/03/16 0517 02/03/16 0844  BP: 129/71   111/63  Pulse: 104   108  Temp:  97.7 F (36.5 C) 97.9 F (36.6 C) 98.6 F (37 C)  TempSrc:  Oral Oral Oral  Resp:   18   Weight:   216 lb 8 oz (98.204 kg)   SpO2:  98% 100% 100%    Intake/Output Summary (Last 24 hours) at 02/03/16 0938 Last data filed at 02/03/16 0936  Gross per 24 hour  Intake   1260 ml  Output   3780 ml  Net  -2520 ml   Filed Weights   02/01/16 1233 02/02/16 0500 02/03/16 0517  Weight: 212 lb 9.6 oz (96.435 kg) 216 lb 14.4 oz (98.385 kg) 216 lb 8 oz (98.204 kg)    PHYSICAL EXAM General: Well developed, well nourished,obese female in no acute distress. Head: Normocephalic, atraumatic.  Neck: Supple without bruits, No JVD. Lungs:  Resp regular and unlabored, CTA. Heart: RRR, S1, S2, no S3, S4, or murmur; no rub. Abdomen: Soft, non-tender, non-distended, BS + x 4.  Extremities: No clubbing, cyanosis, generalized edema.  Neuro: Alert and  oriented X 3. Moves all extremities spontaneously. Psych: Normal affect.  LABS: CBC: Recent Labs  02/01/16 0800 02/02/16 0308  WBC 4.9 5.2  HGB 7.6* 8.3*  HCT 24.6* 26.3*  MCV 89.5 88.6  PLT 243 [id number]   Basic Metabolic Panel: Recent Labs  02/01/16 1309 02/02/16 0308 02/03/16 0435  NA  --  135 138  K  --  4.7 4.9  CL  --  104 107  CO2  --  19* 22  GLUCOSE  --  286* 132*  BUN  --  58* 47*  CREATININE  --  1.85* 1.50*  CALCIUM  --  9.1 9.4  MG 2.4  --   --    Cardiac Enzymes: Recent Labs  02/02/16 0308 02/02/16 0844 02/02/16 1506  TROPONINI 4.73* 3.05* 1.53*    Recent Labs  02/01/16 0834  TROPIPOC 0.11*   Fasting Lipid Panel: Recent Labs  02/03/16 0435  CHOL 109  HDL 42  LDLCALC 45  TRIG 112  CHOLHDL 2.6   Thyroid Function Tests: Recent Labs  02/01/16 1309  TSH <0.010*     Current facility-administered medications:  .  acetaminophen (TYLENOL) tablet 650 mg, 650 mg, Oral, Q4H PRN, Olin Hauser, DO, 650 mg at 02/02/16 0252 .  aspirin EC tablet 81 mg, 81 mg, Oral, Daily, Devonne Doughty  Karamalegos, DO, 81 mg at 02/03/16 0802 .  atorvastatin (LIPITOR) tablet 40 mg, 40 mg, Oral, q1800, Devonne Doughty Karamalegos, DO, 40 mg at 02/02/16 1657 .  clopidogrel (PLAVIX) tablet 75 mg, 75 mg, Oral, Daily, Sela Hua, MD, 75 mg at 02/03/16 0801 .  docusate sodium (COLACE) capsule 100 mg, 100 mg, Oral, Daily, Olin Hauser, DO, 100 mg at 02/03/16 0801 .  doxycycline (VIBRA-TABS) tablet 100 mg, 100 mg, Oral, Q12H, Olin Hauser, DO, 100 mg at 02/03/16 0801 .  fentaNYL (SUBLIMAZE) injection 50 mcg, 50 mcg, Intravenous, Q2H PRN, Skeet Latch, MD, 50 mcg at 02/02/16 1949 .  HYDROmorphone (DILAUDID) injection 0.5 mg, 0.5 mg, Intravenous, Q6H PRN, Asiyah Cletis Media, MD .  insulin aspart (novoLOG) injection 0-20 Units, 0-20 Units, Subcutaneous, TID WC, Veatrice Bourbon, MD, 3 Units at 02/03/16 0802 .  insulin aspart (novoLOG) injection 0-5  Units, 0-5 Units, Subcutaneous, QHS, Veatrice Bourbon, MD, 2 Units at 02/02/16 2209 .  ipratropium-albuterol (DUONEB) 0.5-2.5 (3) MG/3ML nebulizer solution 3 mL, 3 mL, Nebulization, Q4H PRN, Rogue Bussing, MD .  isosorbide mononitrate (IMDUR) 24 hr tablet 60 mg, 60 mg, Oral, Daily, Devonne Doughty Karamalegos, DO, 60 mg at 02/03/16 0801 .  metoprolol tartrate (LOPRESSOR) 25 mg/10 mL oral suspension 12.5 mg, 12.5 mg, Oral, BID, Rogelia Mire, NP, 12.5 mg at 02/03/16 0801 .  mirtazapine (REMERON) tablet 15 mg, 15 mg, Oral, QHS, Olin Hauser, DO, 15 mg at 02/02/16 2158 .  ondansetron (ZOFRAN) injection 4 mg, 4 mg, Intravenous, Q6H PRN, Olin Hauser, DO .  potassium chloride SA (K-DUR,KLOR-CON) CR tablet 40 mEq, 40 mEq, Oral, BID, Olin Hauser, DO, 40 mEq at 02/03/16 0801 .  predniSONE (DELTASONE) tablet 50 mg, 50 mg, Oral, Q breakfast, Olin Hauser, DO, 50 mg at 02/03/16 0801 .  torsemide (DEMADEX) tablet 40 mg, 40 mg, Oral, q1800, Rogue Bussing, MD .  torsemide [facility]) tablet 80 mg, 80 mg, Oral, Daily, Rogue Bussing, MD, 80 mg at 02/03/16 0802   TELE:   Sinus tach       Radiology/Studies: Dg Knee Complete 4 Views Left  02/02/2016  CLINICAL DATA:  Painful left knee EXAM: LEFT KNEE - COMPLETE 4+ VIEW COMPARISON:  None. FINDINGS: Mild medial compartment narrowing. No joint effusion. There is a large lucent lesion identified within the proximal left tibia which measures 3.4 x 3.2 cm. The margins of this lesion are poorly defined. No overlying periosteal reaction. No fracture. IMPRESSION: 1. Large lucent lesion within the proximal tibia. In this patient who has a history of metastatic breast cancer with known bone metastasis this is suspicious for a lytic bone lesion. Electronically Signed   By: Kerby Moors M.D.   On: 02/02/2016 15:27     Current Medications:  . aspirin EC  81 mg Oral Daily  . atorvastatin  40 mg Oral q1800    . clopidogrel  75 mg Oral Daily  . docusate sodium  100 mg Oral Daily  . doxycycline  100 mg Oral Q12H  . insulin aspart  0-20 Units Subcutaneous TID WC  . insulin aspart  0-5 Units Subcutaneous QHS  . isosorbide mononitrate  60 mg Oral Daily  . metoprolol tartrate  12.5 mg Oral BID  . mirtazapine  15 mg Oral QHS  . potassium chloride SA  40 mEq Oral BID  . predniSONE  50 mg Oral Q breakfast  . torsemide  40 mg Oral q1800  .  torsemide  80 mg Oral Daily      ASSESSMENT AND PLAN: Active Problems:   DM2 (diabetes mellitus, type 2) (HCC)   COPD (chronic obstructive pulmonary disease) (HCC)   Chronic respiratory failure (HCC)   Chronic diastolic heart failure (HCC)   CKD (chronic kidney disease), stage IV (HCC)   History of tobacco abuse   Breast cancer metastasized to bone (HCC)   Venous stasis of lower extremity   COPD exacerbation (HCC)   Chest pain   Acute on chronic congestive heart failure (HCC)   Elevated troponin   NSTEMI (non-ST elevated myocardial infarction) (HCC)   Pain in the chest  1.NSTEMI/CAD: Pt presented on 02/01/16 awaking with 7/10 chest pressure, eventually relieved with ASA in the ED. Initial troponin (poc) was mildly elevated @ 0.11 but has since risen to 3.36. She has a h/o CAD with cath in 06/2013 revealing moderate to severe proximal LAD dzs and an occluded RCA. She is currently chest pain free on asa, statin, and long-acting nitrate. As she is not actively wheezing and is tachycardic with suppressed TSH, beta blocker added. She is still tachycardic with rates in the 100's. Can consider increasing to 25mg  BID.  Ms. Wedekind prefers to avoid catheterization and given co-morbidities present currently including anemia (8.3/26.3), CKD IV with creat of 1.85 - slightly worse than previous, and possible hyperthyroidism, she is not an ideal candidate for cath at this time.Her heparin was discontinued, pt refusing.  2. AECOPD/Brochitis: Recently started on  prednisone and levaquin as outpt in setting of 2'ish wk h/o productive cough with chest and sinus congestion. QTc long on admission and levaquin d/c'd. No active wheezing. Mgmt per IM.  3. Anemia of chronic disease: H/H currently 8.3/26.3, down from 8.5/26.1 on 5/26. Anemia possibly contributing to demand ischemia, esp in setting of mod-severe LAD dzs, pt. Received 1 unit PRBC's this admission  4. Suppressed TSH: She has a h/o this with previously nl FT4. Not eval here in several years. She is not on replacement @ home. She notes that her HR's are always elevated. Further w/u per IM. Add ? blocker.  5. CKD IV: Creat up slightly above baseline. Follow.   6. HL: LDL 54 in 03/2015. Cont statin therapy.  7. Chronic diastolic CHF: previously nl EF by echo in 01/2014. Wt recorded as up 4 lbs since yesterday. BP stable. HR elevated. Exam challenging for volume overload though BNP nl, has diffuse rhonchi and rales in bases.Has refused IV Lasix per nursing stating 'this is not good for me". Will restart her home torsemide as she tells me she will not take Lasix.   8. Prolonged QT: In setting of levaquin. F/u ECG @ slightly slower HR w/ normalization of QT. QTC .46 this am.      Signed, Arbutus Leas , NP 9:38 AM 02/03/2016 Pager [phone]

## 2016-02-03 NOTE — Telephone Encounter (Signed)
Patient currently admitted as of 02/03/16 First TCM attempt should be made 02/06/16 pending discharge

## 2016-02-03 NOTE — Care Management Important Message (Signed)
Important Message  Patient Details  Name: STELLINA GRANDBOIS MRN: [id number] Date of Birth: 30-Jan-1946   Medicare Important Message Given:  Yes    Avilyn Virtue Abena 02/03/2016, 11:02 AM

## 2016-02-03 NOTE — Evaluation (Signed)
Occupational Therapy Evaluation and Discharge Patient Details Name: Anita [name] MRN: [id number] DOB: [birth year] Today's Date: 02/03/2016    History of Present Illness 70 y/o with a h/o CAD, COPD, diast CHF, CKD IV, Anemia, DM II, and breast cancer w/ bone mets currently on chemotherapy (monthly injections) who was admitted on 02/01/16 secondary to left chest pressure and bronchitis.    Clinical Impression   Pt walking throughout her room and bathroom at a modified independent level upon OTs arrival. No family to offer information about pt's PLOF, but likely near her baseline. All equipment needs met. Question safety with regard to IADL at home and recommending HHOT to assess needs if pt is agreeable.   Follow Up Recommendations  Home Health OT    Equipment Recommendations  None recommended by OT    Recommendations for Other Services       Precautions / Restrictions Precautions Precautions: Fall Restrictions Weight Bearing Restrictions: No      Mobility Bed Mobility Overal bed mobility: Independent                Transfers Overall transfer level: Independent Equipment used: None                  Balance Overall balance assessment: Needs assistance;History of Falls Sitting-balance support: No upper extremity supported;Feet supported Sitting balance-Leahy Scale: Good     Standing balance support: Bilateral upper extremity supported;During functional activity Standing balance-Leahy Scale: Fair Standing balance comment: pt can stand statically and maintain balance.                             ADL Overall ADL's : At baseline                                       General ADL Comments: Pt walking about her room looking for her purse. Demonstrated ability to don and doff socks with uni boots intact. Pt able to perform sit<>stand from bed, chair and toilet.     Vision     Perception     Praxis      Pertinent Vitals/Pain  Pain Assessment: No/denies pain     Hand Dominance Right   Extremity/Trunk Assessment Upper Extremity Assessment Upper Extremity Assessment: Overall WFL for tasks assessed   Lower Extremity Assessment Lower Extremity Assessment: Generalized weakness   Cervical / Trunk Assessment Cervical / Trunk Assessment: Kyphotic   Communication Communication Communication: No difficulties   Cognition Arousal/Alertness: Awake/alert Behavior During Therapy: Restless;Anxious;Impulsive Overall Cognitive Status: History of cognitive impairments - at baseline                     General Comments       Exercises       Shoulder Instructions      Home Living Family/patient expects to be discharged to:: Private residence Living Arrangements: Spouse/significant other;Children Available Help at Discharge: Family;Available PRN/intermittently Type of Home: House Home Access: Ramped entrance     Home Layout: One level     Bathroom Shower/Tub: Teacher, early years/pre: Standard Bathroom Accessibility: Yes   Home Equipment: Cane - quad;Bedside commode;Wheelchair - [employer] - 4 wheels;Shower seat          Prior Functioning/Environment Level of Independence: Needs assistance  Gait / Transfers Assistance Needed: States she uses cane or no device. ADL's /  Homemaking Assistance Needed: Assist needed for bathing only per pt   Comments: takes SCAT to appointments and grocery store, attends classes at [facility]    OT Diagnosis: Cognitive deficits;Generalized weakness   OT Problem List:     OT Treatment/Interventions:      OT Goals(Current goals can be found in the care plan section) Acute Rehab OT Goals Patient Stated Goal: to locate her purse  OT Frequency:     Barriers to D/C:            Co-evaluation              End of Session    Activity Tolerance: Patient tolerated treatment well Patient left: in chair;with nursing/sitter in room   Time:  1538-1559 OT Time Calculation (min): 21 min Charges:  OT General Charges $OT Visit: 1 Procedure OT Evaluation $OT Eval Moderate Complexity: 1 Procedure G-Codes:    Malka So 02/03/2016, 4:12 PM  [phone]

## 2016-02-03 NOTE — Progress Notes (Signed)
Inpatient Diabetes Program Recommendations  AACE/ADA: New Consensus Statement on Inpatient Glycemic Control (2015)  Target Ranges:  Prepandial:   less than 140 mg/dL      Peak postprandial:   less than 180 mg/dL (1-2 hours)      Critically ill patients:  140 - 180 mg/dL   Lab Results  Component Value Date   GLUCAP 140* 02/03/2016   HGBA1C 6.7 12/01/2015    Review of Glycemic Control  Inpatient Diabetes Program Recommendations:  Insulin - Meal Coverage: consider adding Novolog 5 units TID with meals  Diet: Add carb modified to current heart healthy diet Thank you  Raoul Pitch MSN, RN,CDE Inpatient Diabetes Coordinator [phone] (team pager)

## 2016-02-03 NOTE — Discharge Instructions (Signed)
[name], Anita [name] were hospitalized for a heart attack. Cardiology recommends continuing 2 new medications-- metoprolol 25 mg twice daily to control heart rate and plavix 75 mg once daily to prevent stroke.   I have prescribed dilaudid to take for severe pain.   We will have you follow-up with Heart Failure for the change in how your heart is pumping seen on echocardiogram.   Thank you for letting us take care of you.

## 2016-02-03 NOTE — Plan of Care (Signed)
Problem: Education: Goal: Knowledge of [facility] Education information/materials will improve Outcome: Completed/Met Date Met:  02/03/16 Pt educated throughout entire hospitalization on tests, procedures, medications, and available resources   Problem: Safety: Goal: Ability to remain free from injury will improve Outcome: Completed/Met Date Met:  02/03/16 Pt has remained free from injury during this admission   Problem: Pain Managment: Goal: General experience of comfort will improve Outcome: Completed/Met Date Met:  02/03/16 Pt remains free from pain  Problem: Physical Regulation: Goal: Will remain free from infection Outcome: Completed/Met Date Met:  02/03/16 Pt remains free from infection during this admission   Problem: Fluid Volume: Goal: Ability to maintain a balanced intake and output will improve Outcome: Completed/Met Date Met:  02/03/16 Pt maintains adequate intake and output   Problem: Nutrition: Goal: Adequate nutrition will be maintained Outcome: Completed/Met Date Met:  02/03/16 Pt able to tolerate current diet with no difficulties

## 2016-02-03 NOTE — Telephone Encounter (Addendum)
Patient called reporting "Received blood transfusion.  Hgb is up to 8.7.  They found a lesion in my leg.  The pain was not arthritis.  Pain med changed to IV and it helped some. '  Advised Ms. Savarese to try to rest, build her strength and call [place] when discharged.  [place] scheduled appointments are current and may need adjustments pending hospital d/c.  Dr. Burr Medico can access hospital  information through [facility] EMR

## 2016-02-03 NOTE — Care Management Note (Signed)
Case Management Note  Patient Details  Name: Anita [name] MRN: [id number] Date of Birth: 04-25-1946  Subjective/Objective:   Pt admitted for Chest Pain. Pt is from home with family support of husband. Pt states she does not use any DME. First attempt pt refused [facility] Services. Refused [facility] Services. Per pt she has an aide via [employer], must be via Medicaid.  Per pt she goes to [facility] wound care center. Pt has not been in the office for a while now and she will not be able to be seen until 3 weeks.                Action/Plan: Staff RN came back to [facility] and pt is now willing to have RN/PT Services- Agency List provided to patient and she stated [place] for [employer]. CM did make referral with [facility] . SOC to begin within 24-48 hrs post d/c. No further needs from CM at this time.   Expected Discharge Date:                  Expected Discharge Plan:  Home/Self Care  In-House Referral:  NA  Discharge planning Services  CM Consult  Post Acute Care Choice:  NA Choice offered to:  NA  DME Arranged:  N/A DME Agency:  NA  HH Arranged:  NA HH Agency:  NA  Status of Service:  Completed, signed off  Medicare Important Message Given:  Yes Date Medicare IM Given:    Medicare IM give by:    Date Additional Medicare IM Given:    Additional Medicare Important Message give by:     If discussed at [place] of Stay Meetings, dates discussed:    Additional Comments:  Bethena Roys, RN 02/03/2016, 4:01 PM

## 2016-02-03 NOTE — Progress Notes (Signed)
Family Medicine Teaching Service Daily Progress Note Intern Pager: [phone]  Patient name: Anita [name] Medical record number: [id number] Date of birth: [birth year] Age: 70 y.o. Gender: female  Primary Care Provider: [name], DO Consultants: Cardiology Code Status: Full  Pt Overview and Major Events to Date:  6/7: Admitted with chest pain and dyspnea  Assessment and Plan: Anita [name] is a 70 y.o. female presenting with new onset chest pain, in setting of recent productive cough / dyspnea concern for COPD exacerbation. 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  Chest pain, rule out MI, with history of CAD s/p prior NSTEMI: Treating as NSTEMI given elevated troponins. Consistent with mixed atypical / typical chest pain new onset day of presentation, most likely related to recent illness with persistent cough and possible AECOPD. Significant cardiac history, Heart Score 5, relieved with ASA in ED, elevated troponin on initial test in ED, EKG without ischemic changes. Troponins 0.06 > 3.36 > 4.92 > 4.43 > 4.73 > 3.05 > 1.53. Repeat EKG unchanged. Hyperthyroidism could also be cause of extra demand. Hgb of 7.6 may also have been contributing.  - On continuous cardiac monitoring.  - S/p ASA 325 in ED - Cardiology consulted, recommended heparin drip, metoprolol and plavix.  - Increase metoprolol to 25 mg BID.  - Resume ASA 81 today, continue statin, start metoprolol 12.5 mg BID - Continue to cycle troponins until stabilizes  - Repeat ECHO, last 01/2014, as heart failure could be cause of elevated troponins - Risk stratification labs lipids and TSH, last A1c 6.7 (11/2015) --> LDL 41  Dyspnea / Productive Cough, presumed AECOPD, in setting chronic respiratory failure Recently treated outpatient  for AECOPD, however did not complete antibiotic / prednisone course only x 1 day treatment. Clinically now without hypoxia and no crackles on exam. CXR R>L lower more likely bronchitis consider potential infiltrate. Considered potential PE with tachycardia and some dyspnea, inc risk on anti-estrogen treatment for metastatic breast cancer, Well's Score 2.5 (still unlikely risk). Patient refusing further doses of lasix.  - S/p Lasix 40mg  IV in ED - DC'd Levaquin. Started doxycycline 100mg  BID x 7 days on 6/7 (Day 3 of 7) - Resumed Prednisone 50mg  daily x 5 days (Day 3 of 5) - Duoneb q 4 hour PRN - Supplemental O2 PRN  Prolonged QTc >600: Improved to 460 after discontinuing levaquin Recently treated with Levaquin for COPD, only received x 1 dose 6/6 - Discontinued Levaquin - Mag 2.4 - Repeat EKG improved  Chronic Diastolic CHF, without clear evidence of acute exacerbation; Repeat ECHO with LVEF 25-30%, G2DD on 02/02/16 Clinically without significant evidence of volume overload on exam and CTAB today, though with decreased air movement. On admission was nearly at dry wt 209 lbs per prior cardiology note 08/2015. However, weight was 205 on 6/2. BNP mild elevated 125, prior has been higher. Last ECHO 55% EF in 2015. - S/p Lasix 40mg  IV in ED - Weight 212.6 lb on admission, 216.9 today.  - Restart home torsemide 120 mg daily - Recommend outpatient follow-up with CHF clinic - Check daily wt, strict I/O  Hyperthyroidism: TSH < 0.010. Free T4 1.96. T3, total 185.Patient reports occasional palpitations and increased fatigue.  - Rate control with metoprolol  Left knee pain: - Obtain 4-view xray to rule out metastasis --> new lucency  of left proximal tibia - Dilaudid 0.5 mcg IV q2h prn helped with pain better than fentanyl - Tylenol 650 mg q4h prn for pain  Anemia: Improved. Hgb 7.6 on admission (Baseline ~8.5). No known source of bleeding. - Improved to 9.0 today.  - s/p 1 unit pRBCs 6/7 - Improved  to 8.3 - Monitor with daily CBCs  Metastatic breast cancer to bone/lung, on chronic anti-estrogen therapy Stable, dx initially 2014. Followed by Oncology. On Ibrance anti-estrogen therapy. - New lucency to left proximal tibia   CKD-IV B/l Cr 1.8 - 2.0, near baseline today at 1.5, elevated BUN clinically mildly dry also with lower BP - Follow Cr, volume status  Diabetes, Type 2: CGG elevated to 450, requiring 22 units of lantus Last A1c 6.7 (11/2015). Not on medication. Concern given prednisone now. - Resistant SSI, no night time coverage, no meal time - CBG checks q AC  Chronic venous stasis, bilateral lower ext - Consulted wound care --> ACE compression wraps - May need unna boots in future  FEN/GI: SLIV, heart diet Prophylaxis: Lovenox SQ per pharmacy with reduced GFR  Disposition: Continue on telemetry inpatient for chest pain rule out MI, cardiology consult, possible acute CHF vs acute COPD etiology of dyspnea. Anticipate discharge in 1-2 days.   Subjective:  Patient denies recurrence of chest pain. She denies shortness of breath. Knee pain improved with dilaudid.  Objective: Temp:  [97.7 F (36.5 C)-98.3 F (36.8 C)] 97.9 F (36.6 C) (06/09 0517) Pulse Rate:  [95-110] 104 (06/08 2212) Resp:  [18-19] 18 (06/09 0517) BP: (118-137)/(60-79) 129/71 mmHg (06/08 2212) SpO2:  [93 %-100 %] 100 % (06/09 0517) Weight:  [216 lb 8 oz (98.204 kg)] 216 lb 8 oz (98.204 kg) (06/09 0517) Physical Exam: General: chronically ill 70 yr female, laying in bed, in NAD Neck: supple without significant JVD Cardiovascular: RRR, no murmurs, intact +2 distal pulses Respiratory: CTAB, speaks full short sentences, no supplemental O2 Abdomen: soft, obese, non-tender, non-distended, +active BS Skin: warm, dry, bilateral lower ext with ACE wraps in place Neuro: grossly intact and non-focal, AOx3 Psych: normal mood and affect  Laboratory:  Recent Labs Lab 02/01/16 0800 02/02/16 0308  WBC 4.9  5.2  HGB 7.6* 8.3*  HCT 24.6* 26.3*  PLT 243 247    Recent Labs Lab 02/01/16 0800 02/02/16 0308 02/03/16 0435  NA 136 135 138  K 4.1 4.7 4.9  CL 101 104 107  CO2 22 19* 22  BUN 59* 58* 47*  CREATININE 2.08* 1.85* 1.50*  CALCIUM 9.2 9.1 9.4  GLUCOSE 390* 286* 132*   Imaging/Diagnostic Tests: Dg Chest 2 View  02/01/2016  CLINICAL DATA:  Midline chest pain and productive cough which shortness-of-breath 2-3 weeks. COPD. EXAM: CHEST  2 VIEW COMPARISON:  01/20/2016 FINDINGS: Lungs are adequately inflated without focal consolidation or effusion. Minimal prominence of the right infrahilar bronchovascular markings. Mild stable cardiomegaly. Minimal calcified plaque over the aortic arch. There are mild degenerative changes of the spine. IMPRESSION: Minimal prominence of the right infrahilar bronchovascular markings which may be due to an acute bronchitic process. Electronically Signed   By: Marin Olp M.D.   On: 02/01/2016 09:03   Dg Knee Complete 4 Views Left  02/02/2016  CLINICAL DATA:  Painful left knee EXAM: LEFT KNEE - COMPLETE 4+ VIEW COMPARISON:  None. FINDINGS: Mild medial compartment narrowing. No joint effusion. There is a large lucent lesion identified within the proximal left tibia which measures 3.4 x 3.2 cm. The margins of this  lesion are poorly defined. No overlying periosteal reaction. No fracture. IMPRESSION: 1. Large lucent lesion within the proximal tibia. In this patient who has a history of metastatic breast cancer with known bone metastasis this is suspicious for a lytic bone lesion. Electronically Signed   By: Kerby Moors M.D.   On: 02/02/2016 15:27    Hillary Corinda Gubler, MD 02/03/2016, 7:27 AM PGY-1, [place] Intern pager: [phone], text pages welcome

## 2016-02-06 ENCOUNTER — Telehealth: Payer: Self-pay | Admitting: Internal Medicine

## 2016-02-06 ENCOUNTER — Telehealth: Payer: Self-pay | Admitting: Hematology

## 2016-02-06 ENCOUNTER — Other Ambulatory Visit: Payer: Self-pay | Admitting: Hematology

## 2016-02-06 DIAGNOSIS — C7951 Secondary malignant neoplasm of bone: Principal | ICD-10-CM

## 2016-02-06 DIAGNOSIS — C50919 Malignant neoplasm of unspecified site of unspecified female breast: Secondary | ICD-10-CM

## 2016-02-06 NOTE — Telephone Encounter (Signed)
Will forward to rn team.  Treven Holtman,CMA  

## 2016-02-06 NOTE — Telephone Encounter (Signed)
Patient contacted regarding discharge from [facility] on 02/03/16.  Patient understands to follow up with provider L. GERHADTon 02/13/16 at  At [facility] . Patient understands discharge instructions? yes Patient understands medications and regiment? yes Patient understands to bring all medications to this visit? yes   PATIENT STATES SHE CANCELLED THE ABOVE APPOINTMENT - SHE WILL BE SEEING PRIMARY 02/09/16 SHE RESCHEDULE TO SEE DR Percival Spanish 02/2016

## 2016-02-06 NOTE — Telephone Encounter (Signed)
per pof pt to have a PET-adv pt they will call to sch with her

## 2016-02-06 NOTE — Telephone Encounter (Signed)
Kayla from [facility] calls, states patient needs prior authorization sent to [facility] for her nebulizer. Please advise.

## 2016-02-07 ENCOUNTER — Telehealth: Payer: Self-pay | Admitting: *Deleted

## 2016-02-07 DIAGNOSIS — R6889 Other general symptoms and signs: Secondary | ICD-10-CM

## 2016-02-07 MED ORDER — GUAIFENESIN 400 MG PO TABS: 400.0000 mg | ORAL_TABLET | ORAL | Status: DC | PRN

## 2016-02-07 NOTE — Telephone Encounter (Signed)
Will forward to MD. Jazmin Hartsell,CMA  

## 2016-02-07 NOTE — Telephone Encounter (Signed)
Patient states she got a rx for doxycycline on 6/9 for Dr. Ola Spurr and needs a refill because it was only 4 pills and she took them all.

## 2016-02-07 NOTE — Telephone Encounter (Signed)
"  I called central scheduling for my bone densitytest to check lwesion in left knee.  They do not have an order and say they're waiting for insurance to authorize."  Advised  PET scan is ordered for entire body including knee on yesterday.  "are you saying the request is over there?"  Yes.  Please allow time for staff and insurance to review this request.  No further questions.

## 2016-02-07 NOTE — Telephone Encounter (Signed)
Called [employer] this morning regarding prior authorization for nebulizer.  However, there was not documentation noted regarding nebulizer machine or medication.  Patient received nebulizer from [facility] over a year ago.  Will wait for [facility] representative call again for nebulizer/medication. Derl Barrow, RN

## 2016-02-07 NOTE — Telephone Encounter (Signed)
Arlyss Gandy from [facility] called and needs MD to know the following:  1. Pt is refusing PT 2. Pt DOES want a Education officer, museum ([employer] will need an order for this) 3. Pt is only taking one metoprolol (supposed to be 2 pills daily) 4. Her prednisone bottle says 20mg s BID, but the discharge summary says 50mg s once daily 5. Pt states that she is a diet control diabetic, but her sugars have been 290 and 295 the last couple of visits from [facility].   Will forward to MD.  Please contact Krysta for orders or additional information. Fleeger, Salome Spotted, CMA

## 2016-02-07 NOTE — Telephone Encounter (Signed)
Called patient to follow-up on antibiotic question. Likely had bronchitis while hospitalized. Antibiotics given as precaution against possible COPD. Was prescribed doxycyline to take BID but took once daily and ran out of pills (and inadequate number prescribed to complete full 7-day course). Patient states she is doing fine with her breathing (despite incomplete antibiotic treatment) but would like something to reduce mucus. Will prescribe quaifenesin.

## 2016-02-09 ENCOUNTER — Ambulatory Visit (HOSPITAL_COMMUNITY)
Admission: RE | Admit: 2016-02-09 | Discharge: 2016-02-09 | Disposition: A | Discharge status: Home / Self Care | Payer: Commercial Managed Care - HMO | Source: Ambulatory Visit | Attending: Family Medicine | Admitting: Family Medicine

## 2016-02-09 ENCOUNTER — Encounter: Payer: Self-pay | Admitting: Internal Medicine

## 2016-02-09 ENCOUNTER — Ambulatory Visit (INDEPENDENT_AMBULATORY_CARE_PROVIDER_SITE_OTHER): Payer: Commercial Managed Care - HMO | Admitting: Internal Medicine

## 2016-02-09 VITALS — BP 129/57 | HR 92 | Temp 97.8°F | Ht 62.0 in | Wt 212.4 lb

## 2016-02-09 DIAGNOSIS — R9431 Abnormal electrocardiogram [ECG] [EKG]: Secondary | ICD-10-CM

## 2016-02-09 DIAGNOSIS — I2510 Atherosclerotic heart disease of native coronary artery without angina pectoris: Secondary | ICD-10-CM | POA: Diagnosis not present

## 2016-02-09 DIAGNOSIS — I2583 Coronary atherosclerosis due to lipid rich plaque: Secondary | ICD-10-CM

## 2016-02-09 DIAGNOSIS — I5042 Chronic combined systolic (congestive) and diastolic (congestive) heart failure: Secondary | ICD-10-CM

## 2016-02-09 DIAGNOSIS — J441 Chronic obstructive pulmonary disease with (acute) exacerbation: Secondary | ICD-10-CM | POA: Diagnosis not present

## 2016-02-09 DIAGNOSIS — R0602 Shortness of breath: Secondary | ICD-10-CM | POA: Insufficient documentation

## 2016-02-09 DIAGNOSIS — I4581 Long QT syndrome: Secondary | ICD-10-CM | POA: Diagnosis not present

## 2016-02-09 DIAGNOSIS — M899 Disorder of bone, unspecified: Secondary | ICD-10-CM

## 2016-02-09 MED ORDER — PREDNISONE 50 MG PO TABS: ORAL_TABLET | ORAL | Status: DC

## 2016-02-09 NOTE — Telephone Encounter (Signed)
Phone call returned while patient was in clinic. Please see clinic note from 6/15.   Phill Myron, D.O. 02/09/2016, 5:08 PM PGY-1, [place]

## 2016-02-09 NOTE — Progress Notes (Signed)
Subjective:    Anita [name] - 70 y.o. female MRN [id number]  Date of birth: 03/31/1946  HPI  Anita [name] is here for hospital follow up.  NSTEMI/CAD: Patient with chest pain in the hospital relieved by aspirin. EKG without ischemic changes but troponin elevated to 4.92. Patient reports compliance with new Rx for Metoprolol and Plavix. She continues to take her Imdur and Atorvastatin. She denies chest pain since leaving the hospital. Case discussed with home health nurse who reports that patient has only been taking Metoprolol once per day as opposed to prescribed twice per day.   AECOPD vs. Bronchitis: Prior to hospitalization patient was treated in outpatient setting for COPD exacerbation and was given Rx for Levaquin. Due to Qtc of 620 on EKG, Levaquin d/c and she was treated with Doxycyline. She finished the Doxycycline. After discharge she was prescribed Mucinex but didn't take it because it was $10. Has completed Prednisone course.  Has not had to use Albuterol. Did need to use it prior to hospitalization. Breathing is only somewhat better than when hospitalized. Productive cough which she has had for about 3 weeks. Occasionally has feelings of tightness across her chest where she can't catch her breath. Has a lot of nasal congestion. Did have a fever about one week ago that was 102 degrees.   Chronic CHF: Last ECHO 55% EF in 2015. Repeat ECHO with LVEF 25-30%, G2DD on 02/02/16. Currently with SOB with exertion.   Knee Pain: Complained of left knee pain during hospitalization and was found to have new large lucency of left proximal tibia, concerning for lytic lesion, on x-ray. She was discharged with Dilaudid for pain control. She has the Rx but has not taken any of it since discharge. Not having any knee pain right now and able to weight bear on left leg. Anita [name] (oncology) doing a PET scan next week.    -  reports that she quit smoking about 3 years ago. Her smoking use included  Cigarettes. She started smoking about 54 years ago. She has a 100 pack-year smoking history. She has never used smokeless tobacco. - Review of Systems: Per HPI. - Past Medical History: Patient Active Problem List   Diagnosis Date Noted  . Chronic combined systolic and diastolic congestive heart failure ([place]) 02/09/2016  . Lytic lesion of bone on x-ray 02/09/2016  . Shortness of breath 02/09/2016  . NSTEMI (non-ST elevated myocardial infarction) ([place])   . Pain in the chest   . Chest pain 02/01/2016  . Acute on chronic congestive heart failure ([place])   . Elevated troponin   . COPD exacerbation ([place]) 12/01/2015  . Erythema of lower extremity 12/01/2015  . Palliative care encounter   . Wound cellulitis   . Severe sepsis ([place]) 05/12/2015  . Sepsis ([place]) 05/12/2015  . AKI (acute kidney injury) ([place]) 05/12/2015  . Leg wound, right 05/12/2015  . Laceration   . Absolute anemia   . CAD in native artery   . Venous stasis ulcer ([place])   . COLD (chronic obstructive lung disease) ([place])   . Leg laceration 04/04/2015  . Laceration of lower extremity 04/04/2015  . Venous stasis of lower extremity 12/05/2014  . Intertrigo 02/19/2014  . 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 Hyperlipidemia 02/11/2013  . Breast cancer metastasized to bone ([place]) 10/14/2012  . Chronic respiratory failure ([place]) 10/06/2012  . Chronic diastolic heart failure ([place]) 10/06/2012  . CAD (coronary artery disease) 10/06/2012  . CKD (chronic kidney disease),  stage IV ([place]) 10/06/2012  . Anemia of chronic disease 10/06/2012  . Breast mass, right 10/06/2012  . History of tobacco abuse 10/06/2012  . COPD (chronic obstructive pulmonary disease) ([place]) 09/27/2012  . DM2 (diabetes mellitus, type 2) ([place]) 09/26/2012   - Medications: reviewed and updated    Objective:   Physical Exam BP 129/57 mmHg  Pulse 92  Temp(Src) 97.8 F (36.6 C) (Oral)  Ht 5\' 2"  (1.575 m)  Wt 212 lb 6.4 oz (96.344 kg)  BMI 38.84 kg/m2  SpO2 95% Gen:  appears winded, NAD  CV: RRR, good S1/S2, no murmur Resp: Poor air movement. Diffuse wheezing appreciated. No retractions noted. Mild tachypnea.  Ext: LE wrapped in unna boots.      Assessment & Plan:   Chronic combined systolic and diastolic congestive heart failure (HCC) Repeat echo with significant worsening of systolic function.  -referral placed to CHF clinic   CAD (coronary artery disease) Discussed need to take Metoprolol BID as opposed to once per day.  -continue Imdur, Metoprolol, Plavix, and Atorvastatin -discussed appropriate metoprolol dose with [facility] nurse  -f/u with cardiology   Lytic lesion of bone on x-ray In left knee. Currently pain free.  -Dilaudid for pain control prn  -f/u with oncology for PET scan   Shortness of breath Given poor air movement on exam and wheezing appreciated, concerned for COPD exacerbation/persistent bronchitis. Reassured that patient with appropriate O2 saturation on RA ( home O2 present at visit but not on patient at time vitals were taken). Repeat EKG was obtained at clinic with Qtc of 430. Wheezing may also be cardiac in nature. Weight is down from hospitalization by 4 lbs however it is still higher than prior [facility] visits in June. Dry weight is  209 lbs per prior cardiology note 08/2015, so patient was 3 lbs over dry weight at today's visit. Difficult to access patient's volume status clinically. BNP 125 at recent hospitalization with pervious values having been up to 160. Concern for acute exacerbation of CHF remains. Low suspicion for PNA given afebrile status and negative CXR from recent admission.  -will treat as COPD exacerbation with Prednisone 50 mg x5 days and Levaquin 500 mg x7 days (patient reports she still has Rx at home, she is to call if she does not have enough pills to complete 7 day course), safe to use Levaquin given normal Qtc on EKG today  -will obtain BNP and f/u results, if markedly elevated could give extra dose of torsemide    -follow up in clinic in 1-2 weeks or sooner if respiratory status worsening  -if still not improving would consider repeat CXR    Krysta from [facility] had called on 6/14. Returned call while patient was in clinic. Social work referral placed. Also discussed that expect CBGs to become elevated while on prednisone. CBGs in the 200s are acceptable and given patient's long term prognosis less concerned with strict glucose control.    Phill Myron, D.O. 02/09/2016, 5:04 PM PGY-1, [place]

## 2016-02-09 NOTE — Assessment & Plan Note (Signed)
In left knee. Currently pain free.  -Dilaudid for pain control prn  -f/u with oncology for PET scan

## 2016-02-09 NOTE — Patient Instructions (Addendum)
I want you to take Prednisone 50 mg for five days. I also want you to take Levaquin 500 mg once per day for 7 days. Please call me if you do not have enough of this medication left over at home.   It is important that you take your Metoprolol 25 mg twice per day (not just once per day).    I will call you with the results of your BNP (lab measuring if you are having acute heart failure) because depending on results we may need to increase your Torsemide.    We will call you for an appointment for heart failure clinic.   Follow up with me in 1-2 weeks or sooner if breathing worsens.   Take Care,   Dr. Juleen China

## 2016-02-09 NOTE — Assessment & Plan Note (Signed)
Discussed need to take Metoprolol BID as opposed to once per day.  -continue Imdur, Metoprolol, Plavix, and Atorvastatin -discussed appropriate metoprolol dose with [place]  -f/u with cardiology

## 2016-02-09 NOTE — Assessment & Plan Note (Addendum)
Given poor air movement on exam and wheezing appreciated, concerned for COPD exacerbation/persistent bronchitis. Reassured that patient with appropriate O2 saturation on RA ( home O2 present at visit but not on patient at time vitals were taken). Repeat EKG was obtained at clinic with Qtc of 430. Wheezing may also be cardiac in nature. Weight is down from hospitalization by 4 lbs however it is still higher than prior [facility] visits in June. Dry weight is  209 lbs per prior cardiology note 08/2015, so patient was 3 lbs over dry weight at today's visit. Difficult to access patient's volume status clinically. BNP 125 at recent hospitalization with pervious values having been up to 160. Concern for acute exacerbation of CHF remains. Low suspicion for PNA given afebrile status and negative CXR from recent admission.  -will treat as COPD exacerbation with Prednisone 50 mg x5 days and Levaquin 500 mg x7 days (patient reports she still has Rx at home, she is to call if she does not have enough pills to complete 7 day course), safe to use Levaquin given normal Qtc on EKG today  -will obtain BNP and f/u results, if markedly elevated could give extra dose of torsemide  -follow up in clinic in 1-2 weeks or sooner if respiratory status worsening  -if still not improving would consider repeat CXR

## 2016-02-09 NOTE — Assessment & Plan Note (Signed)
Repeat echo with significant worsening of systolic function.  -referral placed to CHF clinic

## 2016-02-10 ENCOUNTER — Telehealth: Payer: Self-pay | Admitting: Internal Medicine

## 2016-02-10 ENCOUNTER — Telehealth: Payer: Self-pay | Admitting: *Deleted

## 2016-02-10 LAB — BRAIN NATRIURETIC PEPTIDE: Brain Natriuretic Peptide: 157.4 pg/mL — ABNORMAL HIGH (ref <100)

## 2016-02-10 NOTE — Telephone Encounter (Signed)
TC from patient stating that she needs her upcoming PET Scan authorized. I believe she has [employer].  She gave the phone # of [phone] to contact. Her scan is scheduled for Thursday 02/16/16

## 2016-02-10 NOTE — Telephone Encounter (Signed)
Called patient to follow up on respiratory status from office visit yesterday, 6/15. Patient did not answer cell phone so left VM asking patient to return call to clinic. Also tried home phone without any answer.   BNP resulted at 157. Patient has previously had BNP up to 160 without being treated for CHF exacerbation. Would not be inclined to treat for acute CHF exacerbation unless respiratory status has worsened since clinic visit.   Additionally, want to discuss swallow issues further to ensure this is not an acute issue suggestive of stroke.   Phill Myron, D.O. 02/10/2016, 2:13 PM PGY-1, [place]

## 2016-02-10 NOTE — Telephone Encounter (Signed)
She did not bring up this swallow test at visit yesterday and we have not assessed need for test. I would advise her to make an appointment so that we can discuss further.   Phill Myron, D.O. 02/10/2016, 1:39 PM PGY-1, [place]

## 2016-02-10 NOTE — Telephone Encounter (Signed)
Pt says her daughter wants her to have a swallow test.  Please advise

## 2016-02-12 ENCOUNTER — Telehealth: Payer: Self-pay | Admitting: Family Medicine

## 2016-02-12 ENCOUNTER — Telehealth: Payer: Self-pay | Admitting: Physician Assistant

## 2016-02-12 NOTE — Telephone Encounter (Signed)
Received call from patient on after hours line. Requested that I call in something for sleep. Told her that I would not be able to send anything in and that she should schedule an appointment in clinic for evaluation. Patient voiced understanding and had no further questions.  Anita [name]. Jerline Pain, [place] Medicine Resident PGY-2 02/12/2016 7:33 AM

## 2016-02-12 NOTE — Telephone Encounter (Signed)
Ms Menasco called saying she had not slept in 5 days. She is afraid to take OTC meds to help her sleep as she takes a great deal of medication.  She says she has not gained but 2 lbs since d/c, does not think SOB is keeping her awake. Compliant with medications.  Says she is no longer on Dilaudid and is taking the Remeron at bedtime as directed.  Advised her I had reviewed the Family Medicine Resident's note and I agree. Because of her multiple medical issues, she needs evaluation prior to anyone prescribing sleep medication. Advised her this is mainly a primary MD problem. Encouraged her to f/u next week with them.  Rosaria Ferries, Hershal Coria 02/12/2016 1:21 PM Bee[place] [phone]

## 2016-02-12 NOTE — Telephone Encounter (Signed)
Physician Telephone Note  Reason for call: Not sleeping  Discussion with patient: Patient states that she has not been sleeping well and is requesting medication to help with sleep. Advised patient that I cannot prescribe new medication for her on the after hours line. Advised to call the clinic tomorrow to request evaluation or to contact her PCP for this request. Will forward this note to her PCP so she is aware. Patient agrees with plan.  Cordelia Poche, MD PGY-3, [place] Medicine 02/12/2016, 1:21 PM

## 2016-02-13 ENCOUNTER — Ambulatory Visit: Payer: Commercial Managed Care - HMO

## 2016-02-13 ENCOUNTER — Telehealth: Payer: Self-pay | Admitting: *Deleted

## 2016-02-13 ENCOUNTER — Encounter: Payer: Commercial Managed Care - HMO | Admitting: Nurse Practitioner

## 2016-02-13 NOTE — Telephone Encounter (Signed)
Albina Billet, RN with [facility] completed a home visit today.  Per Albina Billet she placed unna boots on patient legs and the patient cut the off due to itching.  Patient has scratched her legs to the point they bleed.  Claris Che that patient should be seen by provider if she is itching that bad.  Patient has an appointment for tomorrow the same day provider for breathing issue.  Will forward to PCP and same day provider.  Please give Albina Billet a call with questions at [phone].  Derl Barrow, RN

## 2016-02-14 ENCOUNTER — Telehealth: Payer: Self-pay | Admitting: *Deleted

## 2016-02-14 ENCOUNTER — Ambulatory Visit (INDEPENDENT_AMBULATORY_CARE_PROVIDER_SITE_OTHER): Payer: Commercial Managed Care - HMO | Admitting: Family Medicine

## 2016-02-14 ENCOUNTER — Other Ambulatory Visit: Payer: Self-pay

## 2016-02-14 ENCOUNTER — Encounter: Payer: Self-pay | Admitting: Family Medicine

## 2016-02-14 VITALS — BP 95/57 | HR 97 | Temp 98.0°F | Ht 62.0 in | Wt 213.0 lb

## 2016-02-14 DIAGNOSIS — I5042 Chronic combined systolic (congestive) and diastolic (congestive) heart failure: Secondary | ICD-10-CM

## 2016-02-14 DIAGNOSIS — J9611 Chronic respiratory failure with hypoxia: Secondary | ICD-10-CM | POA: Diagnosis not present

## 2016-02-14 DIAGNOSIS — I5043 Acute on chronic combined systolic (congestive) and diastolic (congestive) heart failure: Secondary | ICD-10-CM

## 2016-02-14 DIAGNOSIS — J441 Chronic obstructive pulmonary disease with (acute) exacerbation: Secondary | ICD-10-CM

## 2016-02-14 NOTE — Assessment & Plan Note (Addendum)
Now worsening acute exacerbation of COPD (moderate) with persistent productive cough. Finishing Levaquin but has not started prednisone burst. - Chronic hypoxic respiratory failure, currently stable O2 status on home 4L O2. Afebrile, no focal crackles, but overall decreased air movement  Plan: 1. Finish Levaquin 500 x 2 more days for total 7 days 2. Start Prednisone 50mg  daily x 5 days (has not picked up yet, will get from pharmacy today) 3. Use albuterol neb q 4 hr regularly x 2-3 days. Continue maintenance inhalers 4. Strict return criteria given likely follow-up within 2-3 days - considered CXR, however opted for maximizing COPD treatment given not started prednisone since last visit

## 2016-02-14 NOTE — Progress Notes (Signed)
Subjective:    Patient ID: Anita [name], female    DOB: July 31, 1946, 70 y.o.   MRN: [id number]  Anita [name] is a 70 y.o. female presenting on 02/14/2016 for coughing   Patient presents for a same day appointment.   HPI   AECOPD / PRODUCTIVE COUGH / ACUTE ON CHRONIC SYSTOLIC CHF - Recent hospitalization 6/7 to 6/9 for variety of problems including AECOPD treated with Levaquin and switched to Doxycycline and Prednisone burst. Also with chest pain with diagnosis of NSTEMI, followed by cardiology, newly started on metoprolol and plavix, continued imdur and statin. She was seen for hospital follow-up on 02/09/16 by PCP at [facility], at that time thought that she may have persistent AECOPD vs Bronchitis with continued productive cough now x 3 weeks, breathing had initially improved but cough worsening with some chest tightness, however her O2 was stable on RA even, repeat EKG done in office without ischemic changes, QTc improved to 430, she did not seem to be overloaded, checked BNP up to 150 (some elevation from prior), afebrile and thought less likely PNA. At that time she was given repeat course COPD treatment with Levaquin 500mg  x 7 days and Prednisone 50mg  x 5 days.  - Today she presents for follow-up with persistent cough, and concerns from [place] of "generalized itching", worse in lower legs with unna boots which are now removed. She admits to taking Torsemide 80mg  in AM and 40mg  in PM, no missed doses, denies significant weight gain, but is up +1 lb today to 213 from 212 lb over past 5 days. She has been taking Levaquin, tolerating well, has 2 more days left, has not started the Prednisone has this at pharmacy and able to start today. Has Albuterol nebulzier at home, but not using it regularly, only daily per report. - Next appointment with cardiologist is 03/22/16 - Admits productive cough, dyspnea on exertion (chronic) - Deneis fever/chills, active chest pain or pressure, syncope   Social  History  Substance Use Topics  . Smoking status: Former Smoker -- 2.00 packs/day for 50 years    Types: Cigarettes    Start date: 08/27/1961    Quit date: 09/24/2012  . Smokeless tobacco: Never Used     Comment: Quit when COPD dx  . Alcohol Use: No    Review of Systems Per HPI unless specifically indicated above     Objective:    BP 95/57 mmHg  Pulse 97  Temp(Src) 98 F (36.7 C) (Oral)  Ht 5\' 2"  (1.575 m)  Wt 213 lb (96.616 kg)  BMI 38.95 kg/m2  SpO2 96%  Wt Readings from Last 3 Encounters:  02/14/16 213 lb (96.616 kg)  02/09/16 212 lb 6.4 oz (96.344 kg)  02/03/16 216 lb 8 oz (98.204 kg)    Physical Exam  Constitutional: She appears well-developed and well-nourished. No distress.  Chronically ill appearing, cooperative, mild respiratory discomfort, on supplemental O2 4L  HENT:  Head: Normocephalic and atraumatic.  Mouth/Throat: Oropharynx is clear and moist.  Cardiovascular: Normal rate, regular rhythm, normal heart sounds and intact distal pulses.   No murmur heard. Pulmonary/Chest:  Reduced air movement bilateral bases, mild resp distress but speaks short phrases and without tachypnea. No clear bibasilar crackles. Some coarse breath sounds mid lungs bilateral, non-focal crackles. No clear high pitched exp wheezing today.  Musculoskeletal: She exhibits edema (improved bilateral LE edema +1 to trace with ace wrap on).  Calves non-tender  Neurological: She is alert.  Skin: Skin is warm  and dry. She is not diaphoretic.  Nursing note and vitals reviewed.  Results for orders placed or performed in visit on 02/09/16  Brain natriuretic peptide  Result Value Ref Range   Brain Natriuretic Peptide 157.4 (H) <100 pg/mL   *Note: Due to a large number of results and/or encounters for the requested time period, some results have not been displayed. A complete set of results can be found in Results Review.      Assessment & Plan:   Problem List Items Addressed This Visit     COPD exacerbation ([place]) - Primary    Now worsening acute exacerbation of COPD (moderate) with persistent productive cough. Finishing Levaquin but has not started prednisone burst. - Chronic hypoxic respiratory failure, currently stable O2 status on home 4L O2. Afebrile, no focal crackles, but overall decreased air movement  Plan: 1. Finish Levaquin 500 x 2 more days for total 7 days 2. Start Prednisone 50mg  daily x 5 days (has not picked up yet, will get from pharmacy today) 3. Use albuterol neb q 4 hr regularly x 2-3 days. Continue maintenance inhalers 4. Strict return criteria given likely follow-up within 2-3 days - considered CXR, however opted for maximizing COPD treatment given not started prednisone since last visit      Chronic respiratory failure (HCC) (Chronic)   Chronic combined systolic and diastolic congestive heart failure (HCC)   Acute on chronic congestive heart failure ([place])    Concern for possible acute CHF exac with mild elevated BNP 150 last visit, wt up 1 lb, clinically seems more likely AECOPD, but cannot rule out CHF. Reduced air movement bilateral lung basis may be related to fluid. LE with chronic edema (improved with ace wraps)  Plan: 1. Increase Torsemide to 80mg  BID for now, likely 1-2 weeks until next follow-up may continue at this dose long term 2. Continue to treat underlying AECOPD with prednisone 3. Follow-up as scheduled with Cardiology 4. Strict return criteria if worsening         No orders of the defined types were placed in this encounter.      Follow up plan: Return in about 3 days (around 02/17/2016), or if symptoms worsen or fail to improve, for COPD / CHF.  Nobie Putnam, [place], PGY-3

## 2016-02-14 NOTE — Telephone Encounter (Signed)
Voicemail from central scheduling.  PET not authorized.  Called patient to reschedule to 02-20-2016 first available but patient said she could not.  Needs before 02-20-2016 so if it needs to be done sooner, call Mitzi Hansen at [phone]." This nurse called patient and scheduling requesting available time for PET.  Coordinated lab at 1:00, arrive for PET at 1:30, to arrive back to [facility] AT 3:30 TO SEE DR. FENG.  Will request lab appointment change.  Any changes pending insurance authorization or provider preferences.  Message left on patient's mobile voicemail with these changes.  Awaiting call from patient.

## 2016-02-14 NOTE — Patient Outreach (Signed)
[facility]) Care Management  02/14/2016  Anita [name] 1946-05-16 [id number]    Telephone Screen  Referral Date: 02/10/16 Referral Source: MD office Referral Reason: "recent hospital discharge, patient had refused services but now wants [facility]"    Outreach attempt #1 to patient. Patient reached and screening completed.   Social: patient difficult to understand at times during call and delayed responses. She states that she lives with her spouse. She uses [employer] for transportation. She states that she needs assistance with ADLs/IADLs. She voices she has an aide coming M-F to assist her with bathing and personal care but patient unable to provide agency name. DME in the home consists of cbg meter, cane,walker and wheelchair. Although, notes indicate home oxygen usage prn but denied.   Conditions: Patient has PMH of HTN, DM, current smoker, CAD,CHF,HLD, COPD, CKD, Breast CA with mets to bone and chronic left knee pain. She states that she does not monitor BP in the home. She voices checking blood sugars "about every two days." She reports that cbgs range in the low 200s. Per patient report is weighing every other day. Denies any edema or SOB. Patient poor historian when trying to discuss medical conditions. Noted in records that family has requested swallow eval. Patient denied having any issues with swallowing. Most recent hospital discharge was on 02/03/16 for chest pain.    Medications: Patient states she taking about 10 meds. She denies any issues with affordability and/or managing meds. She reports that her aide helps her with her meds.  Appointments: Patient saw PCP on 02/09/16. Per records she has been referred to CHF clinic but patient unable to provide info regarding appt.  Consent: Patient gave verbal consent for [facility] services.   Plan: RN CM will notify [facility] administrative assistant of case status. RN CM will send [facility] community referral for TOC case.   Enzo Montgomery, RN,BSN,CCM [place] Management Telephonic Care Management Coordinator Direct Phone: [phone] Toll Free: [phone] Fax: [phone]

## 2016-02-14 NOTE — Assessment & Plan Note (Addendum)
Concern for possible acute CHF exac with mild elevated BNP 150 last visit, wt up 1 lb, clinically seems more likely AECOPD, but cannot rule out CHF. Reduced air movement bilateral lung basis may be related to fluid. LE with chronic edema (improved with ace wraps)  Plan: 1. Increase Torsemide to 80mg  BID for now, likely 1-2 weeks until next follow-up may continue at this dose long term 2. Continue to treat underlying AECOPD with prednisone 3. Follow-up as scheduled with Cardiology 4. Strict return criteria if worsening

## 2016-02-14 NOTE — Telephone Encounter (Signed)
Pt being seen today by Dr. Parks Ranger, [place] [facility] for breathing issues. Ottis Stain, CMA

## 2016-02-14 NOTE — Patient Instructions (Signed)
Thank you for coming in to clinic today.  1. Your cough is most likely from COPD, you are having a flare up. Finish the Levaquin antibiotic. Start taking Prednisone 50mg  daily for 5 days (pick up at your pharmacy) no new rx sent today. 2. Also last time they checked fluid level blood test- it was elevated, recommend taking Torsemide 80mg  TWICE daily (instead of 40mg  in evening) 3. Use Albuterol nebulizer every 4 to 6 hours around the clock for next 3 days then as needed  If symptoms getting significantly worse with chest pain, pressure, worsening cough despite treatment, fevers/chills, increased weight gain or fluid status, then return sooner or may need to go directly to Emergency Department call EMS.  Please schedule a follow-up appointment with Dr Juleen China within 3 days if not improving COPD / CHF exac  If you have any other questions or concerns, please feel free to call the clinic to contact me. You may also schedule an earlier appointment if necessary.  However, if your symptoms get significantly worse, please go to the Emergency Department to seek immediate medical attention.  Nobie Putnam, [place]

## 2016-02-15 ENCOUNTER — Other Ambulatory Visit: Payer: Self-pay | Admitting: *Deleted

## 2016-02-15 ENCOUNTER — Telehealth: Payer: Self-pay | Admitting: Hematology

## 2016-02-15 NOTE — Patient Outreach (Signed)
Referral received from telephonic care manager to initiate transition of care program as member was recently discahrged from hospital (6/9) with chest pain and congestive heart failure.  According to chart, she also has history of diabetes, kidney disease, COPD, breast caner, and heart attack.  She was previously involved with [facility] for community case management.  Call placed to member, she verifies identity, this care manager introduces self and purpose of call.  She state she has been doing "ok."  She confirms that she has completed her follow up appointment with her PCP and that she was referred to the heart failure clinic.  She states she has not been contacted by the clinic for an appointment yet, advised to contact them and report recent admission, and request an appointment.  Provided with contact information.    She state she is being seen by [facility] home health nurse twice a week, focus is congestive heart failure and wound care.  She reports wounds on her legs, states she is also seen by the wound clinic, next appointment next week.    Member verbalizes understanding of heart failure zones, is aware of when to contact the physician.  States her weight today was 209 pounds, a decrease of 4 pounds from her last office visit.  Advised to continue daily weights.  She denies any concerns at this time, provided with contact information for this care manager, encouraged to contact with questions.  Home visit scheduled for next week.  Valente David, [place], MSN [place] [phone]

## 2016-02-15 NOTE — Telephone Encounter (Signed)
spoke w/ pt confirmed added lab on 6/26 @ 1:00

## 2016-02-15 NOTE — Telephone Encounter (Signed)
Patient called stating her blood sugars are elevated to >600. She was recently diagnosed with and started on therapy with prednisone and [place] for a COPD exacerbation, She called EMS to check her breathing this evening although she denies having any SOB or chest pain. She denies any weakness. She is concerned that the steroids she was started on have raised her blood sugars. She was asked to monitor her blood sugars and if they remain elevated, she should consider coming to the ED or making a same day appointment for tomorrow. She was also asked to present to the ED for SOB and chest pain. She stated that she has a home health nurse coming on Friday, 6/23 and she wanted to wait until then. When again she was asked to seek care sooner than Friday, she hung up the phone

## 2016-02-16 ENCOUNTER — Telehealth: Payer: Self-pay | Admitting: *Deleted

## 2016-02-16 ENCOUNTER — Encounter (HOSPITAL_COMMUNITY): Payer: Commercial Managed Care - HMO

## 2016-02-16 NOTE — Telephone Encounter (Signed)
Needs [facility] referral. Doctor fong. npi [id number]. Cpt C50.919 and C79.51. appt tomorrow.

## 2016-02-16 NOTE — Telephone Encounter (Signed)
Done

## 2016-02-20 ENCOUNTER — Ambulatory Visit (HOSPITAL_BASED_OUTPATIENT_CLINIC_OR_DEPARTMENT_OTHER): Payer: Commercial Managed Care - HMO | Admitting: Hematology

## 2016-02-20 ENCOUNTER — Ambulatory Visit (HOSPITAL_COMMUNITY): Payer: Commercial Managed Care - HMO

## 2016-02-20 ENCOUNTER — Other Ambulatory Visit (HOSPITAL_BASED_OUTPATIENT_CLINIC_OR_DEPARTMENT_OTHER): Payer: Commercial Managed Care - HMO

## 2016-02-20 ENCOUNTER — Telehealth: Payer: Self-pay | Admitting: Hematology

## 2016-02-20 ENCOUNTER — Telehealth: Payer: Self-pay | Admitting: Internal Medicine

## 2016-02-20 ENCOUNTER — Encounter: Payer: Self-pay | Admitting: Hematology

## 2016-02-20 ENCOUNTER — Ambulatory Visit (HOSPITAL_BASED_OUTPATIENT_CLINIC_OR_DEPARTMENT_OTHER): Payer: Commercial Managed Care - HMO

## 2016-02-20 ENCOUNTER — Other Ambulatory Visit: Payer: Commercial Managed Care - HMO

## 2016-02-20 VITALS — BP 101/55 | HR 99 | Temp 98.3°F | Resp 18 | Ht 61.0 in | Wt 215.7 lb

## 2016-02-20 DIAGNOSIS — I2510 Atherosclerotic heart disease of native coronary artery without angina pectoris: Secondary | ICD-10-CM | POA: Diagnosis not present

## 2016-02-20 DIAGNOSIS — J410 Simple chronic bronchitis: Secondary | ICD-10-CM

## 2016-02-20 DIAGNOSIS — C50919 Malignant neoplasm of unspecified site of unspecified female breast: Secondary | ICD-10-CM

## 2016-02-20 DIAGNOSIS — C7951 Secondary malignant neoplasm of bone: Secondary | ICD-10-CM | POA: Diagnosis not present

## 2016-02-20 DIAGNOSIS — C50911 Malignant neoplasm of unspecified site of right female breast: Secondary | ICD-10-CM

## 2016-02-20 DIAGNOSIS — Z5111 Encounter for antineoplastic chemotherapy: Secondary | ICD-10-CM | POA: Diagnosis not present

## 2016-02-20 DIAGNOSIS — C7800 Secondary malignant neoplasm of unspecified lung: Secondary | ICD-10-CM

## 2016-02-20 DIAGNOSIS — N184 Chronic kidney disease, stage 4 (severe): Secondary | ICD-10-CM | POA: Diagnosis not present

## 2016-02-20 DIAGNOSIS — I2583 Coronary atherosclerosis due to lipid rich plaque: Secondary | ICD-10-CM

## 2016-02-20 DIAGNOSIS — E1122 Type 2 diabetes mellitus with diabetic chronic kidney disease: Secondary | ICD-10-CM

## 2016-02-20 DIAGNOSIS — D638 Anemia in other chronic diseases classified elsewhere: Secondary | ICD-10-CM

## 2016-02-20 DIAGNOSIS — N183 Chronic kidney disease, stage 3 (moderate): Secondary | ICD-10-CM

## 2016-02-20 DIAGNOSIS — I83009 Varicose veins of unspecified lower extremity with ulcer of unspecified site: Secondary | ICD-10-CM

## 2016-02-20 DIAGNOSIS — L97909 Non-pressure chronic ulcer of unspecified part of unspecified lower leg with unspecified severity: Secondary | ICD-10-CM

## 2016-02-20 DIAGNOSIS — D6959 Other secondary thrombocytopenia: Secondary | ICD-10-CM

## 2016-02-20 LAB — COMPREHENSIVE METABOLIC PANEL
ALK PHOS: 147 U/L (ref 40–150)
ALT: 65 U/L — AB (ref 0–55)
ANION GAP: 12 meq/L — AB (ref 3–11)
AST: 34 U/L (ref 5–34)
Albumin: 3 g/dL — ABNORMAL LOW (ref 3.5–5.0)
BILIRUBIN TOTAL: 0.49 mg/dL (ref 0.20–1.20)
BUN: 38 mg/dL — ABNORMAL HIGH (ref 7.0–26.0)
CALCIUM: 9.5 mg/dL (ref 8.4–10.4)
CHLORIDE: 98 meq/L (ref 98–109)
CO2: 28 meq/L (ref 22–29)
CREATININE: 2 mg/dL — AB (ref 0.6–1.1)
EGFR: 25 mL/min/{1.73_m2} — AB (ref >90)
Glucose: 279 mg/dl — ABNORMAL HIGH (ref 70–140)
POTASSIUM: 3.8 meq/L (ref 3.5–5.1)
Sodium: 138 mEq/L (ref 136–145)
Total Protein: 6.3 g/dL — ABNORMAL LOW (ref 6.4–8.3)

## 2016-02-20 LAB — CBC WITH DIFFERENTIAL/PLATELET
BASO%: 0.2 % (ref 0.0–2.0)
BASOS ABS: 0 10*3/uL (ref 0.0–0.1)
EOS%: 2.2 % (ref 0.0–7.0)
Eosinophils Absolute: 0.1 10*3/uL (ref 0.0–0.5)
HEMATOCRIT: 30.8 % — AB (ref 34.8–46.6)
HGB: 9.9 g/dL — ABNORMAL LOW (ref 11.6–15.9)
LYMPH%: 7.7 % — AB (ref 14.0–49.7)
MCH: 28.2 pg (ref 25.1–34.0)
MCHC: 32 g/dL (ref 31.5–36.0)
MCV: 88.2 fL (ref 79.5–101.0)
MONO#: 0.2 10*3/uL (ref 0.1–0.9)
MONO%: 3.3 % (ref 0.0–14.0)
NEUT#: 4.2 10*3/uL (ref 1.5–6.5)
NEUT%: 86.6 % — AB (ref 38.4–76.8)
PLATELETS: 262 10*3/uL (ref 145–400)
RBC: 3.49 10*6/uL — AB (ref 3.70–5.45)
RDW: 18.2 % — ABNORMAL HIGH (ref 11.2–14.5)
WBC: 4.8 10*3/uL (ref 3.9–10.3)
lymph#: 0.4 10*3/uL — ABNORMAL LOW (ref 0.9–3.3)

## 2016-02-20 MED ORDER — DENOSUMAB 120 MG/1.7ML ~~LOC~~ SOLN
120.0000 mg | Freq: Once | SUBCUTANEOUS | Status: AC
  Administered 2016-02-20: 120 mg via SUBCUTANEOUS
  Filled 2016-02-20: qty 1.7

## 2016-02-20 MED ORDER — FULVESTRANT 250 MG/5ML IM SOLN
500.0000 mg | INTRAMUSCULAR | Status: DC
  Administered 2016-02-20: 500 mg via INTRAMUSCULAR
  Filled 2016-02-20: qty 10

## 2016-02-20 MED ORDER — HYDROCODONE-ACETAMINOPHEN 5-325 MG PO TABS: 1.0000 | ORAL_TABLET | Freq: Four times a day (QID) | ORAL | Status: AC | PRN

## 2016-02-20 NOTE — Telephone Encounter (Signed)
Gave and printed appt sched and avs for pt for July  °

## 2016-02-20 NOTE — Telephone Encounter (Signed)
Daleen Snook from [facility] called and would like to speak to one of the nurses for the patient. She is wanting to get verbal orders for the patient to have a speech therapist work with her. Anita [name]

## 2016-02-20 NOTE — Patient Instructions (Signed)
Denosumab injection What is this medicine? DENOSUMAB (den oh sue mab) slows bone breakdown. Prolia is used to treat osteoporosis in women after menopause and in men. Xgeva is used to prevent bone fractures and other bone problems caused by cancer bone metastases. Xgeva is also used to treat giant cell tumor of the bone. This medicine may be used for other purposes; ask your health care provider or pharmacist if you have questions. What should I tell my health care provider before I take this medicine? They need to know if you have any of these conditions: -dental disease -eczema -infection or history of infections -kidney disease or on dialysis -low blood calcium or vitamin D -malabsorption syndrome -scheduled to have surgery or tooth extraction -taking medicine that contains denosumab -thyroid or parathyroid disease -an unusual reaction to denosumab, other medicines, foods, dyes, or preservatives -pregnant or trying to get pregnant -breast-feeding How should I use this medicine? This medicine is for injection under the skin. It is given by a health care professional in a hospital or clinic setting. If you are getting Prolia, a special MedGuide will be given to you by the pharmacist with each prescription and refill. Be sure to read this information carefully each time. For Prolia, talk to your pediatrician regarding the use of this medicine in children. Special care may be needed. For Xgeva, talk to your pediatrician regarding the use of this medicine in children. While this drug may be prescribed for children as young as 13 years for selected conditions, precautions do apply. Overdosage: If you think you have taken too much of this medicine contact a poison control center or emergency room at once. NOTE: This medicine is only for you. Do not share this medicine with others. What if I miss a dose? It is important not to miss your dose. Call your doctor or health care professional if you are  unable to keep an appointment. What may interact with this medicine? Do not take this medicine with any of the following medications: -other medicines containing denosumab This medicine may also interact with the following medications: -medicines that suppress the immune system -medicines that treat cancer -steroid medicines like prednisone or cortisone This list may not describe all possible interactions. Give your health care provider a list of all the medicines, herbs, non-prescription drugs, or dietary supplements you use. Also tell them if you smoke, drink alcohol, or use illegal drugs. Some items may interact with your medicine. What should I watch for while using this medicine? Visit your doctor or health care professional for regular checks on your progress. Your doctor or health care professional may order blood tests and other tests to see how you are doing. Call your doctor or health care professional if you get a cold or other infection while receiving this medicine. Do not treat yourself. This medicine may decrease your body's ability to fight infection. You should make sure you get enough calcium and vitamin D while you are taking this medicine, unless your doctor tells you not to. Discuss the foods you eat and the vitamins you take with your health care professional. See your dentist regularly. Brush and floss your teeth as directed. Before you have any dental work done, tell your dentist you are receiving this medicine. Do not become pregnant while taking this medicine or for 5 months after stopping it. Women should inform their doctor if they wish to become pregnant or think they might be pregnant. There is a potential for serious side effects   to an unborn child. Talk to your health care professional or pharmacist for more information. What side effects may I notice from receiving this medicine? Side effects that you should report to your doctor or health care professional as soon as  possible: -allergic reactions like skin rash, itching or hives, swelling of the face, lips, or tongue -breathing problems -chest pain -fast, irregular heartbeat -feeling faint or lightheaded, falls -fever, chills, or any other sign of infection -muscle spasms, tightening, or twitches -numbness or tingling -skin blisters or bumps, or is dry, peels, or red -slow healing or unexplained pain in the mouth or jaw -unusual bleeding or bruising Side effects that usually do not require medical attention (Report these to your doctor or health care professional if they continue or are bothersome.): -muscle pain -stomach upset, gas This list may not describe all possible side effects. Call your doctor for medical advice about side effects. You may report side effects to FDA at 1-800-FDA-1088. Where should I keep my medicine? This medicine is only given in a clinic, doctor's office, or other health care setting and will not be stored at home. NOTE: This sheet is a summary. It may not cover all possible information. If you have questions about this medicine, talk to your doctor, pharmacist, or health care provider.    2016, Elsevier/Gold Standard. (2012-02-11 12:37:47) Fulvestrant injection What is this medicine? FULVESTRANT (ful VES trant) blocks the effects of estrogen. It is used to treat breast cancer. This medicine may be used for other purposes; ask your health care provider or pharmacist if you have questions. What should I tell my health care provider before I take this medicine? They need to know if you have any of these conditions: -bleeding problems -liver disease -low levels of platelets in the blood -an unusual or allergic reaction to fulvestrant, other medicines, foods, dyes, or preservatives -pregnant or trying to get pregnant -breast-feeding How should I use this medicine? This medicine is for injection into a muscle. It is usually given by a health care professional in a  hospital or clinic setting. Talk to your pediatrician regarding the use of this medicine in children. Special care may be needed. Overdosage: If you think you have taken too much of this medicine contact a poison control center or emergency room at once. NOTE: This medicine is only for you. Do not share this medicine with others. What if I miss a dose? It is important not to miss your dose. Call your doctor or health care professional if you are unable to keep an appointment. What may interact with this medicine? -medicines that treat or prevent blood clots like warfarin, enoxaparin, and dalteparin This list may not describe all possible interactions. Give your health care provider a list of all the medicines, herbs, non-prescription drugs, or dietary supplements you use. Also tell them if you smoke, drink alcohol, or use illegal drugs. Some items may interact with your medicine. What should I watch for while using this medicine? Your condition will be monitored carefully while you are receiving this medicine. You will need important blood work done while you are taking this medicine. Do not become pregnant while taking this medicine or for at least 1 year after stopping it. Women of child-bearing potential will need to have a negative pregnancy test before starting this medicine. Women should inform their doctor if they wish to become pregnant or think they might be pregnant. There is a potential for serious side effects to an unborn child. Men   should inform their doctors if they wish to father a child. This medicine may lower sperm counts. Talk to your health care professional or pharmacist for more information. Do not breast-feed an infant while taking this medicine or for 1 year after the last dose. What side effects may I notice from receiving this medicine? Side effects that you should report to your doctor or health care professional as soon as possible: -allergic reactions like skin rash,  itching or hives, swelling of the face, lips, or tongue -feeling faint or lightheaded, falls -pain, tingling, numbness, or weakness in the legs -signs and symptoms of infection like fever or chills; cough; flu-like symptoms; sore throat -vaginal bleeding Side effects that usually do not require medical attention (report to your doctor or health care professional if they continue or are bothersome): -aches, pains -constipation -diarrhea -headache -hot flashes -nausea, vomiting -pain at site where injected -stomach pain This list may not describe all possible side effects. Call your doctor for medical advice about side effects. You may report side effects to FDA at 1-800-FDA-1088. Where should I keep my medicine? This drug is given in a hospital or clinic and will not be stored at home. NOTE: This sheet is a summary. It may not cover all possible information. If you have questions about this medicine, talk to your doctor, pharmacist, or health care provider.    2016, Elsevier/Gold Standard. (2015-03-11 11:03:55)  

## 2016-02-20 NOTE — Progress Notes (Signed)
Mio, DO [place] [id number]  CHIEF COMPLAIN: follow up metastatic breast cancer     Breast cancer metastasized to bone ([place])   10/08/2012 Imaging CT chest, abdomen and pelvis showed extensive sclerotic lesions throughout the bones, 4.9 x 3.4 cm soft tissue versus hematoma anterior to the spleen. 6 mm groundglass nodule in the right upper lobe of lung.   10/10/2012 Initial Biopsy Right breast core needle biopsy showed invasive lobular carcinoma, grade 2. Left iliac crest bone biopsy showed metastatic carcinoma, morphology similar to breast biopsy.   10/14/2012 Initial Diagnosis Breast cancer metastasized to bone [facility])   11/2012 - 07/17/2015 Anti-estrogen oral therapy Letrozole '1mg'$  daily, add palbociclib on 01/01/2014, dose modified to 2 weeks on and 1 week off with cycle 3, stopped due to disease progression   02/02/2014 - 02/24/2014 Radiation Therapy Palliative radiation to left and the right femur, 30 gray in 10 fractions, by Dr. Sondra Come    09/01/2014 Imaging CT chest abdomen and pelvis showed several small pulmonary nodules are not changed. Stable widespread bone metastasis.   10/10/2014 Receptors her2 ER 100% positive, PR negative, HER-2 negative.   02/08/2015 Imaging PET scan showed a stable 8 mm right level III lymph node with SUV 8.8, no new neck adenopathy, stable diffuse osseous metastasis, stable for many nodules.   07/04/2015 Progression  PET scan showed overal dasease progression in the bones , slightly increased hypermetabolic activ of the lung nodules , similar appearance of hypermetaabolic rght level III lymph node.   07/18/2015 -  Anti-estrogen oral therapy  fulvestrant 500 mg every 4 weeks ( loading dose every  weeksX2),  and Ibrance '100mg'$  daily,  3 week on, 1 week off   11/04/2015 Imaging PET scan showed interval response to therapy, especially bone metastasis. Right level III cervical node and right upper  lobe pulmonary nodule also demonstrate decreased hypermetabolism.     CURRENT THERAPY:   1. She started letrozole in 11/2012.  Added Palbociclib 125 mg daily for 21 days on and 7 days off on 01/01/2014 with disease progression. Dose modified to 2 weeks on 1 week off with cycle 3.  Letrozole changed to fulvestrant on 07/18/2015 due to disease progress, and Ibrance changed to '100mg'$  daily, 3 weeks on and one week off.  2. zometa added on 01/01/2014 for metastatic bone disease. Zometa held due to renal insufficiency and changed to [facility] because of skeletal disease progression and safe renal profile.   INTERVAL HISTORY:  Anita [name] 70 y.o. female with a history of right breast cancer with metastases to the bone/lungs here for follow-up.  She Was admitted to hospital on 02/01/2016 for chest pain. She had a significantly elevated troponin, was seen by a cardiology service, and received heparin drip. Cardiac catheterization was not offered due to her multiple comorbidities. Repeated echo showed EF 25-30%. She was also treated for COPD exacerbation with antibiotics and steroids. She was discharged home on 02/03/2016. She overall feels better, no recurrent chest pain, however she has persistent productive cough, no fever or chills. She has not required oxygen lately.   MEDICAL HISTORY: Past Medical History  Diagnosis Date  . Fibroid   . Morbid obesity ([place])   . 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 COPD (chronic obstructive pulmonary disease) ([place])     a. Uses Home O2 w/ exertion.  . Chronic diastolic  CHF (congestive heart failure) ([place])     a. 01/2014 Echo: EF 55%, triv PR.  [name] CAD (coronary artery disease)     a. 08/2012 NSTEMI: Med Rx; b. 10/2012 MV: EF 49%, old inf infarct;  c. 02/2013 NSTEMI-> Med Rx; d. 06/2013 Cath: LM nl, LAD 40p, 80-85p/m, D1 nl, LCX 20p, OM1 nl, RCA 100d w/ L->R collats, EF 30-35%, basal to mid inf AK, ant HK.  [name] Anemia     a. Adm 09/2012  with melena, Hgb 5.8 -> transfused. EGD/colonoscopy unrevealing.  . Breast cancer ([place])     a. Mets to bone. ER 100%/ PR 0%/Her2 neu negative.  [name] Ulcers of both lower legs ([place])   . History of tobacco abuse     a. Quit 2014.  [name] Hyperlipidemia 02/11/2013  . Chronic respiratory failure ([place])     a. On O2 qhs and w/ exertion.  . Lesion of vocal cord     a. CT 05/2013 concerning for tumor.  . Depression   . Diabetes mellitus without complication ([place])   . Dementia     very mild  . Radiation 02/02/14-02/24/14    left and right femur 30 gray  . Anemia of chronic disease   . CKD (chronic kidney disease), stage IV ([facility])     ALLERGIES:  is allergic to omnipaque and benzene.  MEDICATIONS:    Medication List       This list is accurate as of: 02/20/16  9:16 AM.  Always use your most recent med list.               acetaminophen 325 MG tablet  Commonly known as:  TYLENOL  Take 2 tablets (650 mg total) by mouth every 4 (four) hours as needed for headache or mild pain.     albuterol 108 (90 Base) MCG/ACT inhaler  Commonly known as:  PROVENTIL HFA;VENTOLIN HFA  Inhale 2 puffs into the lungs every 6 (six) hours as needed for wheezing or shortness of breath.     aspirin 81 MG EC tablet  Take 1 tablet (81 mg total) by mouth daily.     atorvastatin 40 MG tablet  Commonly known as:  LIPITOR  TAKE 1 TABLET EVERY DAY     bag balm Oint ointment  Apply 1 g topically as needed for dry skin or irritation.     clopidogrel 75 MG tablet  Commonly known as:  PLAVIX  Take 1 tablet (75 mg total) by mouth daily.     clotrimazole 1 % cream  Commonly known as:  LOTRIMIN  Apply 1 application topically daily.     docusate sodium 100 MG capsule  Commonly known as:  COLACE  Take 100 mg by mouth daily.     doxycycline 100 MG tablet  Commonly known as:  VIBRA-TABS  Take 1 tablet (100 mg total) by mouth every 12 (twelve) hours.     FASLODEX 250 MG/5ML injection  Generic drug:  fulvestrant   Inject 250 mg into the muscle every 30 (thirty) days. Cancer center     guaifenesin 400 MG Tabs tablet  Commonly known as:  HUMIBID E  Take 1 tablet (400 mg total) by mouth every 4 (four) hours as needed.     HYDROmorphone 2 MG tablet  Commonly known as:  DILAUDID  Take 1 tablet (2 mg total) by mouth every 4 (four) hours as needed for severe pain.     isosorbide mononitrate 60 MG 24 hr tablet  Commonly known  as:  IMDUR  Take 1 tablet (60 mg total) by mouth daily.     metoprolol tartrate 25 MG tablet  Commonly known as:  LOPRESSOR  Take 1 tablet (25 mg total) by mouth 2 (two) times daily.     mirtazapine 15 MG tablet  Commonly known as:  REMERON  Take 1 tablet (15 mg total) by mouth at bedtime.     nitroGLYCERIN 0.4 MG SL tablet  Commonly known as:  NITROSTAT  Place 1 tablet (0.4 mg total) under the tongue every 5 (five) minutes as needed for chest pain.     NON FORMULARY  Place 3 L into the nose as needed (oxygen).     palbociclib 100 MG capsule  Commonly known as:  IBRANCE  Take 1 capsule (100 mg total) by mouth daily with breakfast. Take whole with food.     potassium chloride SA 20 MEQ tablet  Commonly known as:  K-DUR,KLOR-CON  Take 2 tablets (40 mEq total) by mouth 2 (two) times daily.     predniSONE 50 MG tablet  Commonly known as:  DELTASONE  Take 1 tablet (50 mg total) by mouth daily with breakfast.     predniSONE 50 MG tablet  Commonly known as:  DELTASONE  Take one tablet daily for five days     torsemide 20 MG tablet  Commonly known as:  DEMADEX  Take 1-2 tablets (20-40 mg total) by mouth See admin instructions. 80 mg in the morning 40 mg at night     VITAMIN C PO  Take 500 mg by mouth daily.     VITAMIN D (CHOLECALCIFEROL) PO  Take 1,000 mg by mouth daily.     XGEVA 120 MG/1.7ML Soln injection  Generic drug:  denosumab  Inject 120 mg into the skin once. Monthly at [facility]        SURGICAL HISTORY:  Past Surgical History  Procedure Laterality  Date  . Colonoscopy, esophagogastroduodenoscopy (egd) and esophageal dilation N/A 10/13/2012    Procedure: colonoscopy and egd;  Surgeon: Graylin Shiver, MD;  Location: [facility] ENDOSCOPY;  Service: Endoscopy;  Laterality: N/A;  . Left heart catheterization with coronary angiogram N/A 07/07/2013    Procedure: LEFT HEART CATHETERIZATION WITH CORONARY ANGIOGRAM;  Surgeon: Peter M Swaziland, MD;  Location: [facility] CATH LAB;  Service: Cardiovascular;  Laterality: N/A;   REVIEW OF SYSTEMS:   Constitutional: Denies fevers, chills or abnormal weight loss Eyes: Denies blurriness of vision Ears, nose, mouth, throat, and face: Denies mucositis or sore throat Respiratory: Denies cough, (+) stable dyspnea, no wheezes Cardiovascular: Denies palpitation, chest discomfort or lower extremity swelling Gastrointestinal:  Denies nausea, heartburn or change in bowel habits Skin: Denies abnormal skin rashes Lymphatics: Denies new lymphadenopathy or easy bruising Neurological:Denies numbness, tingling or new weaknesses Behavioral/Psych: Mood is stable, no new changes  All other systems were reviewed with the patient and are negative.  PHYSICAL EXAMINATION: ECOG PERFORMANCE STATUS: 3 Blood pressure 101/55, pulse 99, temperature 98.3 F (36.8 C), resp. rate 18, height 5\' 1"  (1.549 m), weight 215 lb 11.2 oz (97.841 kg), SpO2 99 %. GENERAL:alert, no distress and comfortable morbidly obese woman in wheelchair, chronically ill appearing SKIN: skin color, texture, turgor are normal; multiple skin scratches on her legs  EYES: normal, Conjunctiva are pink and non-injected, sclera clear OROPHARYNX:no exudate, no erythema and lips, buccal mucosa, and tongue normal  NECK: supple, thyroid normal size, non-tender, without nodularity LYMPH:  no palpable lymphadenopathy in the cervical, axillary LUNGS: coarse breathing with slightly  increased breathing rate, (+) crackers on bilateral bases HEART: regular rate & rhythm and no murmurs and 2+  lower extremity edema BREAST: deferred ABDOMEN:abdomen soft, non-tender and normal bowel sounds; obese Musculoskeletal:no cyanosis of digits and no clubbing; right lower extremities are wrapped with gauze up to knees. Left leg exam showed pitting edema to knee, mild diffuse skin erythema above the ankle, scratch markers, no open wound. Skin temperature is normal. NEURO: alert & oriented x 3 with fluent speech, no focal motor/sensory deficits  LABORATORY DATA: CBC Latest Ref Rng 02/03/2016 02/02/2016 02/01/2016  WBC 4.0 - 10.5 K/uL 7.6 5.2 4.9  Hemoglobin 12.0 - 15.0 g/dL 9.0(L) 8.3(L) 7.6(L)  Hematocrit 36.0 - 46.0 % 28.3(L) 26.3(L) 24.6(L)  Platelets 150 - 400 K/uL 282 247 243    CMP Latest Ref Rng 02/03/2016 02/02/2016 02/01/2016  Glucose 65 - 99 mg/dL 132(H) 286(H) 390(H)  BUN 6 - 20 mg/dL 47(H) 58(H) 59(H)  Creatinine 0.44 - 1.00 mg/dL 1.50(H) 1.85(H) 2.08(H)  Sodium 135 - 145 mmol/L 138 135 136  Potassium 3.5 - 5.1 mmol/L 4.9 4.7 4.1  Chloride 101 - 111 mmol/L 107 104 101  CO2 22 - 32 mmol/L 22 19(L) 22  Calcium 8.9 - 10.3 mg/dL 9.4 9.1 9.2  Total Protein 6.4 - 8.3 g/dL - - -  Total Bilirubin 0.20 - 1.20 mg/dL - - -  Alkaline Phos 40 - 150 U/L - - -  AST 5 - 34 U/L - - -  ALT 0 - 55 U/L - - -    PATHOLOGY REPORT  Diagnosis 10/10/2014 1. Bone, biopsy, left iliac crest - METASTATIC MAMMARY CARCINOMA, SEE COMMENT. 2. Breast, right, needle core biopsy - INVASIVE MAMMARY CARCINOMA, SEE COMMENT. Microscopic Comment 1. and 2. The breast biopsy demonstrates definitive morphologic features of invasive mammary carcinoma. Although the grade of tumor is best assessed at resection, with these biopsies, the carcinoma is grade II. An E-Cadherin stain is pending and will be reported as an addendum. The morphology of the metastatic carcinoma in part 2 is identical to the breast biopsy. Breast prognostic studies are pending (part 2) and will be reported as an addendum. The case was reviewed with Dr.  Avis Epley who concurs. The case was discussed with Dr. Lamonte Sakai on 10/13/2012.  RADIOGRAPHIC STUDIES:  CT chest abdomen and pelvis 09/01/2014 IMPRESSION: Chest Impression:  1. Stable exam of thorax. 2. Several small smudgy pulmonary nodules are not changed. 3. Stable widespread bony metastasis pneumothorax.  Abdomen / Pelvis Impression:  1. Stable small periaortic retroperitoneal lymph node. 2. No evidence disease progression. 3. Stable widespread skeletal metastasis. 4. Left sided staghorn calculus again demonstrated. 5. Nodular focus in the right kidney at site of prior renal cyst is not changed  PET 11/04/2015 IMPRESSION: 1. Primarily response to therapy. 2. Right level 3 node is similar in size and slightly decreased in hypermetabolism. Index right upper lobe pulmonary nodule is enlarged minimally but demonstrates decreased hypermetabolism. 3. Improvement in widespread osseous metastasis. No new sites of disease identified. 4. Atherosclerosis, including within the coronary arteries. 5. Pulmonary artery enlargement suggests pulmonary arterial hypertension. 6. Hepatic steatosis and hepatomegaly. 7. Chronic stone filled left renal pelvis.    ASSESSMENT: 70 year old Caucasian female  PLAN:  1.  Metastatic breast cancer (ER pos/PR neg/ HER2 neg) with metastasis to bones and lungs, possible right cervical nodes.  -I reviewed her restaging PET scan from 07/04/2015 with pt in [place], which showed slight disease progression in the bones , lung metastasis are stable in  size, but the largest lesion has increased FDG uptake.  She also noticed some new right-sided chest wall pain, possibly related to bone metastasis.  -Given the mild disease progression, I have changed her antiestrogen therapy from letrozole to fulvestrant injection, and continue Ibrance, but changed her Ibrance from 125 mg 2 weeks on, one week off, to 100 mg, 3 weeks on, 1 week off. -restaging PET scan from 11/04/2015  showed good response to bone metastasis, and a stable pulmonary metastasis. No other new lesions. -she is tolerating treatment well, will continue -She was recently hospitalized for non-STEMI and COPD exacerbation. She has left leg pain and X-ray showed a large lytic lesion in the left tibia. I recommend to obtain a restaging PET scan to further evaluate her disease status.  2. NSTEMI and CHF  -She was hospitalized in June 2017 for chest pain, had a significantly elevated troponin, and repeated echo showed EF 25-30%. -I strongly encouraged her to follow-up with her cardiologist Dr. Percival Spanish   3. Productive cough and COPD exacerbation -She was recently hospitalized and treated for COPD exacerbation -She has completed a course of antibiotics and prednisone. She still has persistent symptoms. -I encouraged her to follow-up with her pulmonologist  4. bone metastasis - continue Xgeva monthly, we'll consider changed to every 3 months after injection  -Continue calcium and vitamin D  5. Anemia of chronic disease (CKD and malignancy), and anemia second to Ibrance -- Past work up for reversible causes of anemia were negative.  -Repeated ferritin 185, serum iron 44 (normal) and saturation 16% on 09/15/2015  -She is not taking iron pill, could not tolerate.  -I do not recommend ESA, due to her underline malignancy  -anemia improved after blood transfusion on 09/17/2015, but not much after 1U RBC in 11/2015 -continue monitoring.   6. Diabetes mellitus, type II.  -not well not medication, diet controlled most of time   7. Chronic renal insufficiency, stage IV   -cr slightly worse  -she will follow-up with Dr. Florene Glen  8: Bilateral leg venous stasis ulcers -She was recently discharged from wound clinic  -follow up with PCP   9. Mild leukopenia and thrombocytopenia -Secondary to [facility] continue monitoring.  All questions were answered. The patient knows to call the clinic with any  problems, questions or concerns. We can certainly see the patient much sooner if necessary.  Plan -restaging PET in a few weeks -I will see her back after PET -Continue fulvestrant and Ibrance  I spent 25 minutes counseling the patient face to face. The total time spent in the appointment was 30 minutes.   Truitt Merle  02/20/2016

## 2016-02-21 ENCOUNTER — Encounter: Payer: Self-pay | Admitting: *Deleted

## 2016-02-21 ENCOUNTER — Telehealth: Payer: Self-pay | Admitting: *Deleted

## 2016-02-21 ENCOUNTER — Other Ambulatory Visit: Payer: Self-pay | Admitting: *Deleted

## 2016-02-21 NOTE — Telephone Encounter (Signed)
Patient needs to come in for an appointment this week regarding these high blood sugars. Please call her to get her scheduled.   Phill Myron, D.O. 02/21/2016, 12:02 PM PGY-1, [place]

## 2016-02-21 NOTE — Telephone Encounter (Signed)
Pt called back. Her sugar was 290 this morning and has gone down to 230.  She just wanted dr Anita [name] to know

## 2016-02-21 NOTE — Patient Outreach (Signed)
[facility]) Care Management   02/21/2016  Anita [name] [birth year] [id number]  Anita [name] is an 70 y.o. female  Subjective:   Member states that she is "ok" today, reports being in the GREEN zone for heart failure, denies chest pain on discomfort.  She reports taking all medications as prescribed, stating her home health aide and nurse help with filling her pill box weekly.  She also report that she has weekly visits to the wound center for leg wounds.  She state that after next week, the home health nurse will no longer provide home visits and she will go to the wound center weekly for dressing changes.  She has a blood pressure monitor, blood sugar meter, and scale, but denies using them independently daily as instructed.    Objective:   Review of Systems  Constitutional: Negative.   HENT: Negative.   Eyes: Negative.   Respiratory: Positive for cough.   Cardiovascular: Negative.   Gastrointestinal: Negative.   Musculoskeletal: Negative.   Skin:       Bilateral leg wounds, wrapped  Neurological: Negative.   Endo/Heme/Allergies: Negative.   Psychiatric/Behavioral: Negative.     Physical Exam  Constitutional: She is oriented to person, place, and time. She appears well-developed and well-nourished.  Neck: Normal range of motion.  Cardiovascular: Normal rate, regular rhythm and normal heart sounds.   Respiratory: Effort normal and breath sounds normal.  GI: Soft. Bowel sounds are normal.  Musculoskeletal: Normal range of motion.  Neurological: She is alert and oriented to person, place, and time.  Skin: Skin is warm and dry.    BP 124/72 mmHg  Pulse 91  Ht 1.575 m ('5\' 2"'$ )  Wt 212 lb (96.163 kg)  BMI 38.77 kg/m2  SpO2 94%   Encounter Medications:   Outpatient Encounter Prescriptions as of 02/21/2016  Medication Sig Note  . ACCU-CHEK AVIVA PLUS test strip  02/20/2016: Received from: External Pharmacy  . acetaminophen (TYLENOL) 325 MG tablet Take 2  tablets (650 mg total) by mouth every 4 (four) hours as needed for headache or mild pain.   [name] albuterol (PROVENTIL HFA;VENTOLIN HFA) 108 (90 BASE) MCG/ACT inhaler Inhale 2 puffs into the lungs every 6 (six) hours as needed for wheezing or shortness of breath.   . Ascorbic Acid (VITAMIN C PO) Take 500 mg by mouth daily.    [name] aspirin EC 81 MG EC tablet Take 1 tablet (81 mg total) by mouth daily.   [name] atorvastatin (LIPITOR) 40 MG tablet TAKE 1 TABLET EVERY DAY   . bag balm OINT ointment Apply 1 g topically as needed for dry skin or irritation.   . clopidogrel (PLAVIX) 75 MG tablet Take 1 tablet (75 mg total) by mouth daily.   . clotrimazole (LOTRIMIN) 1 % cream Apply 1 application topically daily.   [name] denosumab (XGEVA) 120 MG/1.7ML SOLN injection Inject 120 mg into the skin once. Monthly at [facility]   . docusate sodium (COLACE) 100 MG capsule Take 100 mg by mouth daily.   . fulvestrant (FASLODEX) 250 MG/5ML injection Inject 250 mg into the muscle every 30 (thirty) days. Cancer center   . isosorbide mononitrate (IMDUR) 60 MG 24 hr tablet Take 1 tablet (60 mg total) by mouth daily.   . metoprolol tartrate (LOPRESSOR) 25 MG tablet Take 1 tablet (25 mg total) by mouth 2 (two) times daily.   . mirtazapine (REMERON) 15 MG tablet Take 1 tablet (15 mg total) by mouth at bedtime.   [name]  NON FORMULARY Place 3 L into the nose as needed (oxygen).    . palbociclib (IBRANCE) 100 MG capsule Take 1 capsule (100 mg total) by mouth daily with breakfast. Take whole with food.   . potassium chloride SA (K-DUR,KLOR-CON) 20 MEQ tablet Take 2 tablets (40 mEq total) by mouth 2 (two) times daily.   [name] torsemide (DEMADEX) 20 MG tablet Take 1-2 tablets (20-40 mg total) by mouth See admin instructions. 80 mg in the morning 40 mg at night 02/15/2016: 80 mg am, 80 mg pm   . VITAMIN D, CHOLECALCIFEROL, PO Take 1,000 mg by mouth daily.    [name] guaifenesin (HUMIBID E) 400 MG TABS tablet Take 1 tablet (400 mg total) by mouth every 4 (four) hours  as needed. (Patient not taking: Reported on 02/21/2016)   . HYDROcodone-acetaminophen (NORCO/VICODIN) 5-325 MG tablet Take 1 tablet by mouth every 6 (six) hours as needed for moderate pain. (Patient not taking: Reported on 02/21/2016)   . HYDROmorphone (DILAUDID) 2 MG tablet Take 1 tablet (2 mg total) by mouth every 4 (four) hours as needed for severe pain. (Patient not taking: Reported on 02/20/2016)   . nitroGLYCERIN (NITROSTAT) 0.4 MG SL tablet Place 1 tablet (0.4 mg total) under the tongue every 5 (five) minutes as needed for chest pain. (Patient not taking: Reported on 02/20/2016)    No facility-administered encounter medications on file as of 02/21/2016.    Functional Status:   In your present state of health, do you have any difficulty performing the following activities: 02/21/2016 02/01/2016  Hearing? N N  Vision? N N  Difficulty concentrating or making decisions? N N  Walking or climbing stairs? Y N  Dressing or bathing? Y N  Doing errands, shopping? [name]  Preparing Food and eating ? N -  Using the Toilet? N -  In the past six months, have you accidently leaked urine? N -  Do you have problems with loss of bowel control? N -  Managing your Medications? Y -  Managing your Finances? Y -  Housekeeping or managing your Housekeeping? Y -    Fall/Depression Screening:    PHQ 2/9 Scores 02/21/2016 02/14/2016 02/14/2016 02/09/2016 01/30/2016 01/27/2016 12/01/2015  PHQ - 2 Score 0 0 0 0 0 0 0   Fall Risk  02/21/2016 02/14/2016 02/14/2016 02/09/2016 01/30/2016  Falls in the past year? Yes No No No No  Number falls in past yr: 1 - - - -  Injury with Fall? Yes - - - -  Risk Factor Category  High Fall Risk - - - -  Risk for fall due to : History of fall(s) - - - -  Risk for fall due to (comments): - - - - -  Follow up Education provided;Falls prevention discussed - - - -    Assessment:    Arrived at [facility] home at scheduled time, no answer to door after multiple doorbell rings and phone call.  Noted  family/friends sitting in the back of the home (outside), they went in to open door.  Member was sitting in room in recliner asleep.  She was easily aroused but difficult to understand initially.  As she began to wake up more, her speech became more clear, A&O x3.    Discussed self monitoring (blood pressure, blood sugar, and weights) and importance of it as it helps to manage her chronic conditions.  She state that she does monitor, just not every day.  She denies forgetting it, stating "I just don't  do it.  Somebody else will do it for me."  She demonstrates correct usage of equipment, rechecking her blood sugar (it was 296 when home health nurse checked it, currently 213).  Her average blood sugars over the past month (30 days) is 297 according to her meter.  Member received a call during home visit from her PCP office requesting her to come in to address her high blood sugars, member refused to schedule appointment, stating she will attend appointment already schedule next month.  This care manager encouraged her to call back to schedule, but she states "it came down, I'll just go next month."  Last noted A1C from April is 6.7.  Member verbalizes understanding of heart failure zones, able to state signs/symptoms and when to contact physician.  She verbalizes understanding of low sodium diet, stating that she is still able to cook, but also state that the social worker with [facility] has been helping her with applying for [employer].  Advised to contact them for update on status.  She denies any concern regarding heart failure at this time.    Medications reviewed, she has pill box but it need to be filled again.  Offered and attempted to fill pill box, she state that her aide will help her if she does not do it herself.  She denies needing assistance.    She denies any questions at this time, contact information provided.  Encouraged to contact with concerns.  Plan:   Will continue with transition of  care calls next week.  THN CM Care Plan Problem One        Most Recent Value   Care Plan Problem One  Recent Hospitalization   Role Documenting the Problem One  Care Management Coordinator   Care Plan for Problem One  Active   THN Long Term Goal (31-90 days)  Member will not be readmitted to hospital within the next 31 days   THN Long Term Goal Start Date  02/15/16   Interventions for Problem One Long Term Goal  Discussed with member the importance of following discharge instructions, including follow up appointments, medications, diet, and home health involvement, to decrease the risk of readmission   THN CM Short Term Goal #1 (0-30 days)  Member will have appointment with heart failure clinic within the next 2 weeks   THN CM Short Term Goal #1 Start Date  02/15/16   THN CM Short Term Goal #1 Met Date  -- [Not met, will see regular cardiologist instead]   Interventions for Short Term Goal #1  Follow up with PCP complete, advised to contact heart failure clinic for appointment, number provided   THN CM Short Term Goal #2 (0-30 days)  Member will continue to monitor weights daily over the next 4 weeks   THN CM Short Term Goal #2 Start Date  02/15/16   Interventions for Short Term Goal #2  Confirmed member has working scale, discussed importance of daily weights and when to contact physician   THN CM Short Term Goal #3 (0-30 days)  Member will report taing medications as prescribed over the next 4 weeks   THN CM Short Term Goal #3 Start Date  02/15/16   Interventions for Short Tern Goal #3  Medications reviewed, discussed relation to taking medication appropriately and decreased risk of readmission     Valente David, Therapist, sports, MSN [place] Manager [phone]

## 2016-02-21 NOTE — Telephone Encounter (Signed)
Spoke to pt. Anita [name] she is coming in on the 12th and will speak to the Dr then. I told her I would let the Dr know and if she needs to come in sooner, I will call her back. Please advise. Ottis Stain, CMA

## 2016-02-21 NOTE — Telephone Encounter (Signed)
Pt calling stating that her home health nurse said she need to be on metformin because her sugars are too high. Please advise. Deseree Kennon Holter, CMA

## 2016-02-21 NOTE — Telephone Encounter (Signed)
Please tell her that I would prefer her to come in earlier than the 12th if possible as I have gotten several messages about her CBGs from her and home health.   Phill Myron, D.O. 02/21/2016, 4:14 PM PGY-1, [place]

## 2016-02-22 ENCOUNTER — Encounter: Payer: Self-pay | Admitting: *Deleted

## 2016-02-22 NOTE — Telephone Encounter (Signed)
Talked to pt. I explained she needs to come in sooner than the 12th, we are concerned about her sugar level. I tried to make an appt but she said she would have to call back once she knows someone can bring her. She kept saying the 12th isn't that far away so I am not sure she will come in before then. Ottis Stain, CMA

## 2016-02-22 NOTE — Telephone Encounter (Signed)
FYI..See the note below. Ottis Stain, CMA

## 2016-02-23 ENCOUNTER — Telehealth: Payer: Self-pay | Admitting: Family Medicine

## 2016-02-23 ENCOUNTER — Telehealth: Payer: Self-pay | Admitting: *Deleted

## 2016-02-23 ENCOUNTER — Encounter (HOSPITAL_BASED_OUTPATIENT_CLINIC_OR_DEPARTMENT_OTHER): Payer: Commercial Managed Care - HMO | Attending: Internal Medicine

## 2016-02-23 DIAGNOSIS — I252 Old myocardial infarction: Secondary | ICD-10-CM | POA: Insufficient documentation

## 2016-02-23 DIAGNOSIS — I87333 Chronic venous hypertension (idiopathic) with ulcer and inflammation of bilateral lower extremity: Secondary | ICD-10-CM | POA: Insufficient documentation

## 2016-02-23 DIAGNOSIS — L97821 Non-pressure chronic ulcer of other part of left lower leg limited to breakdown of skin: Secondary | ICD-10-CM | POA: Insufficient documentation

## 2016-02-23 DIAGNOSIS — Z923 Personal history of irradiation: Secondary | ICD-10-CM | POA: Diagnosis not present

## 2016-02-23 DIAGNOSIS — J449 Chronic obstructive pulmonary disease, unspecified: Secondary | ICD-10-CM | POA: Insufficient documentation

## 2016-02-23 DIAGNOSIS — E119 Type 2 diabetes mellitus without complications: Secondary | ICD-10-CM | POA: Insufficient documentation

## 2016-02-23 DIAGNOSIS — L97811 Non-pressure chronic ulcer of other part of right lower leg limited to breakdown of skin: Secondary | ICD-10-CM | POA: Diagnosis not present

## 2016-02-23 MED ORDER — METOPROLOL TARTRATE 25 MG PO TABS: 25.0000 mg | ORAL_TABLET | Freq: Two times a day (BID) | ORAL | Status: DC

## 2016-02-23 NOTE — Telephone Encounter (Signed)
Contacted Daleen Snook with [facility] to give verbal orders per Dr. Juleen China note below. Katharina Caper, Lynn Recendiz D, [place]

## 2016-02-23 NOTE — Telephone Encounter (Signed)
Attempted to call [facility] to give verbal orders. No answer on phone call.   Phill Myron, D.O. 02/23/2016, 11:37 AM PGY-1, [place]

## 2016-02-23 NOTE — Telephone Encounter (Signed)
Rx for Metoprolol sent.   Phill Myron, D.O. 02/23/2016, 11:35 AM PGY-1, [place]

## 2016-02-23 NOTE — Telephone Encounter (Signed)
Patient calling requesting refill on metoprolol. [employer]

## 2016-02-23 NOTE — Telephone Encounter (Signed)
Called pt. Phone rang without a VM picking up. If pt calls, please inform her of below. Ottis Stain, CMA

## 2016-02-24 LAB — GLUCOSE, CAPILLARY: GLUCOSE-CAPILLARY: 195 mg/dL — AB (ref 65–99)

## 2016-02-24 NOTE — Telephone Encounter (Signed)
LVM for pt to call the office. If pt calls, please tell her that the Rx for metoprolol has been called in. Ottis Stain, CMA

## 2016-02-27 ENCOUNTER — Encounter (HOSPITAL_COMMUNITY)
Admission: RE | Admit: 2016-02-27 | Discharge: 2016-02-27 | Disposition: A | Discharge status: Home / Self Care | Payer: Commercial Managed Care - HMO | Source: Ambulatory Visit | Attending: Hematology | Admitting: Hematology

## 2016-02-27 DIAGNOSIS — K760 Fatty (change of) liver, not elsewhere classified: Secondary | ICD-10-CM | POA: Insufficient documentation

## 2016-02-27 DIAGNOSIS — C7951 Secondary malignant neoplasm of bone: Secondary | ICD-10-CM

## 2016-02-27 DIAGNOSIS — C50919 Malignant neoplasm of unspecified site of unspecified female breast: Secondary | ICD-10-CM

## 2016-02-27 DIAGNOSIS — I700 Atherosclerosis of aorta: Secondary | ICD-10-CM | POA: Insufficient documentation

## 2016-02-27 DIAGNOSIS — R590 Localized enlarged lymph nodes: Secondary | ICD-10-CM | POA: Diagnosis not present

## 2016-02-27 DIAGNOSIS — R918 Other nonspecific abnormal finding of lung field: Secondary | ICD-10-CM | POA: Insufficient documentation

## 2016-02-27 DIAGNOSIS — I2510 Atherosclerotic heart disease of native coronary artery without angina pectoris: Secondary | ICD-10-CM | POA: Insufficient documentation

## 2016-02-27 DIAGNOSIS — N200 Calculus of kidney: Secondary | ICD-10-CM | POA: Insufficient documentation

## 2016-02-27 DIAGNOSIS — M899 Disorder of bone, unspecified: Secondary | ICD-10-CM | POA: Insufficient documentation

## 2016-02-27 LAB — GLUCOSE, CAPILLARY: Glucose-Capillary: 192 mg/dL — ABNORMAL HIGH (ref 65–99)

## 2016-02-27 MED ORDER — FLUDEOXYGLUCOSE F - 18 (FDG) INJECTION
10.5800 | Freq: Once | INTRAVENOUS | Status: AC | PRN
  Administered 2016-02-27: 10.58 via INTRAVENOUS

## 2016-02-27 NOTE — Telephone Encounter (Signed)
Pt informed. Daughter will pick up. Ottis Stain, CMA

## 2016-02-29 ENCOUNTER — Other Ambulatory Visit: Payer: Self-pay | Admitting: *Deleted

## 2016-02-29 ENCOUNTER — Telehealth: Payer: Self-pay | Admitting: Internal Medicine

## 2016-02-29 ENCOUNTER — Telehealth: Payer: Self-pay | Admitting: Hematology

## 2016-02-29 NOTE — Telephone Encounter (Signed)
pt cld to get time of appt on 7/7-adv time @ 3:30-no lab or injection @ that appt

## 2016-02-29 NOTE — Telephone Encounter (Signed)
Sent Metoprolol Rx already to [facility] per patient's request.

## 2016-02-29 NOTE — Telephone Encounter (Signed)
Kim the speech therapist with [facility] called to extend the verbal orders for a speech evaluation for the patient she has not been able to get a hold of the patient and would like to try again this week. jw

## 2016-03-01 ENCOUNTER — Other Ambulatory Visit: Payer: Self-pay | Admitting: *Deleted

## 2016-03-01 ENCOUNTER — Encounter (HOSPITAL_BASED_OUTPATIENT_CLINIC_OR_DEPARTMENT_OTHER): Payer: Commercial Managed Care - HMO | Attending: Internal Medicine

## 2016-03-01 ENCOUNTER — Other Ambulatory Visit: Payer: Self-pay | Admitting: Internal Medicine

## 2016-03-01 DIAGNOSIS — M1990 Unspecified osteoarthritis, unspecified site: Secondary | ICD-10-CM | POA: Insufficient documentation

## 2016-03-01 DIAGNOSIS — L97221 Non-pressure chronic ulcer of left calf limited to breakdown of skin: Secondary | ICD-10-CM | POA: Insufficient documentation

## 2016-03-01 DIAGNOSIS — L97811 Non-pressure chronic ulcer of other part of right lower leg limited to breakdown of skin: Secondary | ICD-10-CM | POA: Diagnosis not present

## 2016-03-01 DIAGNOSIS — L97411 Non-pressure chronic ulcer of right heel and midfoot limited to breakdown of skin: Secondary | ICD-10-CM | POA: Insufficient documentation

## 2016-03-01 DIAGNOSIS — E11319 Type 2 diabetes mellitus with unspecified diabetic retinopathy without macular edema: Secondary | ICD-10-CM | POA: Insufficient documentation

## 2016-03-01 DIAGNOSIS — L97821 Non-pressure chronic ulcer of other part of left lower leg limited to breakdown of skin: Secondary | ICD-10-CM | POA: Insufficient documentation

## 2016-03-01 DIAGNOSIS — Z794 Long term (current) use of insulin: Secondary | ICD-10-CM | POA: Insufficient documentation

## 2016-03-01 DIAGNOSIS — E11622 Type 2 diabetes mellitus with other skin ulcer: Secondary | ICD-10-CM | POA: Diagnosis not present

## 2016-03-01 DIAGNOSIS — I10 Essential (primary) hypertension: Secondary | ICD-10-CM | POA: Insufficient documentation

## 2016-03-01 DIAGNOSIS — E11621 Type 2 diabetes mellitus with foot ulcer: Secondary | ICD-10-CM | POA: Insufficient documentation

## 2016-03-01 LAB — GLUCOSE, CAPILLARY: GLUCOSE-CAPILLARY: 203 mg/dL — AB (ref 65–99)

## 2016-03-01 MED ORDER — CLOPIDOGREL BISULFATE 75 MG PO TABS: 75.0000 mg | ORAL_TABLET | Freq: Every day | ORAL | Status: DC

## 2016-03-01 NOTE — Patient Outreach (Signed)
Weekly transition of care call placed to member, no answer.  Unable to leave a message as phone continues to ring, no recording.  Will await call back, if no call back will continue with weekly calls next week.  Valente David, [place], MSN [place] [phone]

## 2016-03-01 NOTE — Telephone Encounter (Signed)
Needs refill on plavix.   [place]

## 2016-03-01 NOTE — Telephone Encounter (Signed)
Called and spoke to [place] with [place]. Gave verbal order to try for speech evaluation next week.   Phill Myron, D.O. 03/01/2016, 3:38 PM PGY-2, [place]

## 2016-03-01 NOTE — Telephone Encounter (Signed)
Plavix refill sent to [facility].   Phill Myron, D.O. 03/01/2016, 1:28 PM PGY-2, [place]

## 2016-03-02 ENCOUNTER — Telehealth: Payer: Self-pay | Admitting: Hematology

## 2016-03-02 ENCOUNTER — Encounter: Payer: Self-pay | Admitting: Hematology

## 2016-03-02 ENCOUNTER — Ambulatory Visit (HOSPITAL_BASED_OUTPATIENT_CLINIC_OR_DEPARTMENT_OTHER): Payer: Commercial Managed Care - HMO | Admitting: Hematology

## 2016-03-02 VITALS — BP 104/53 | HR 102 | Temp 98.5°F | Resp 19 | Ht 62.0 in | Wt 218.7 lb

## 2016-03-02 DIAGNOSIS — R05 Cough: Secondary | ICD-10-CM | POA: Diagnosis not present

## 2016-03-02 DIAGNOSIS — C7951 Secondary malignant neoplasm of bone: Secondary | ICD-10-CM

## 2016-03-02 DIAGNOSIS — N184 Chronic kidney disease, stage 4 (severe): Secondary | ICD-10-CM | POA: Diagnosis not present

## 2016-03-02 DIAGNOSIS — C7800 Secondary malignant neoplasm of unspecified lung: Secondary | ICD-10-CM | POA: Diagnosis not present

## 2016-03-02 DIAGNOSIS — C50911 Malignant neoplasm of unspecified site of right female breast: Secondary | ICD-10-CM

## 2016-03-02 NOTE — Progress Notes (Signed)
[place], DO [place] [address]  CHIEF COMPLAIN: follow up metastatic breast cancer     Breast cancer metastasized to bone ([place])   10/08/2012 Imaging CT chest, abdomen and pelvis showed extensive sclerotic lesions throughout the bones, 4.9 x 3.4 cm soft tissue versus hematoma anterior to the spleen. 6 mm groundglass nodule in the right upper lobe of lung.   10/10/2012 Initial Biopsy Right breast core needle biopsy showed invasive lobular carcinoma, grade 2. Left iliac crest bone biopsy showed metastatic carcinoma, morphology similar to breast biopsy.   10/14/2012 Initial Diagnosis Breast cancer metastasized to bone [facility])   11/2012 - 07/17/2015 Anti-estrogen oral therapy Letrozole '1mg'$  daily, add palbociclib on 01/01/2014, dose modified to 2 weeks on and 1 week off with cycle 3, stopped due to disease progression   02/02/2014 - 02/24/2014 Radiation Therapy Palliative radiation to left and the right femur, 30 gray in 10 fractions, by Dr. Sondra Come    09/01/2014 Imaging CT chest abdomen and pelvis showed several small pulmonary nodules are not changed. Stable widespread bone metastasis.   10/10/2014 Receptors her2 ER 100% positive, PR negative, HER-2 negative.   02/08/2015 Imaging PET scan showed a stable 8 mm right level III lymph node with SUV 8.8, no new neck adenopathy, stable diffuse osseous metastasis, stable for many nodules.   07/04/2015 Progression  PET scan showed overal dasease progression in the bones , slightly increased hypermetabolic activ of the lung nodules , similar appearance of hypermetaabolic rght level III lymph node.   07/18/2015 -  Anti-estrogen oral therapy  fulvestrant 500 mg every 4 weeks ( loading dose every  weeksX2),  and Ibrance '100mg'$  daily,  3 week on, 1 week off   11/04/2015 Imaging PET scan showed interval response to therapy, especially bone metastasis. Right level III cervical node and right upper  lobe pulmonary nodule also demonstrate decreased hypermetabolism.     CURRENT THERAPY:   1. She started letrozole in 11/2012.  Added Palbociclib 125 mg daily for 21 days on and 7 days off on 01/01/2014 with disease progression. Dose modified to 2 weeks on 1 week off with cycle 3.  Letrozole changed to fulvestrant on 07/18/2015 due to disease progress, and Ibrance changed to '100mg'$  daily, 3 weeks on and one week off.  2. zometa added on 01/01/2014 for metastatic bone disease. Zometa held due to renal insufficiency and changed to [facility] because of skeletal disease progression and safe renal profile.   INTERVAL HISTORY:  Anita [name] 70 y.o. female with a history of right breast cancer with metastases to the bone/lungs here for follow-up.  She still complains of cough with clear mucus production, she just finished a course of prednisone this week, which was prescribed by her PCP, she finished 2 course of antibiotics 2-3 weeks ago, no fever or chest pains, she has dyspena sometime. Her left knee pain is resolved for most part, she does not take much pain meds. No other new pain, eats well,   MEDICAL HISTORY: Past Medical History  Diagnosis Date  . Fibroid   . Morbid obesity ([place])   . 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 COPD (chronic obstructive pulmonary disease) ([place])     a. Uses Home O2 w/ exertion.  . Chronic diastolic CHF (congestive heart failure) ([place])     a. 01/2014 Echo: EF 55%, triv PR.  [name]  CAD (coronary artery disease)     a. 08/2012 NSTEMI: Med Rx; b. 10/2012 MV: EF 49%, old inf infarct;  c. 02/2013 NSTEMI-> Med Rx; d. 06/2013 Cath: LM nl, LAD 40p, 80-85p/m, D1 nl, LCX 20p, OM1 nl, RCA 100d w/ L->R collats, EF 30-35%, basal to mid inf AK, ant HK.  [name] Anemia     a. Adm 09/2012 with melena, Hgb 5.8 -> transfused. EGD/colonoscopy unrevealing.  . Breast cancer ([place])     a. Mets to bone. ER 100%/ PR 0%/Her2 neu negative.  [name] Ulcers of both lower legs  ([place])   . History of tobacco abuse     a. Quit 2014.  [name] Hyperlipidemia 02/11/2013  . Chronic respiratory failure ([place])     a. On O2 qhs and w/ exertion.  . Lesion of vocal cord     a. CT 05/2013 concerning for tumor.  . Depression   . Diabetes mellitus without complication ([place])   . Dementia     very mild  . Radiation 02/02/14-02/24/14    left and right femur 30 gray  . Anemia of chronic disease   . CKD (chronic kidney disease), stage IV ([facility])     ALLERGIES:  is allergic to omnipaque and benzene.  MEDICATIONS:    Medication List       This list is accurate as of: 03/02/16  1:19 PM.  Always use your most recent med list.               ACCU-CHEK AVIVA PLUS test strip  Generic drug:  glucose blood     acetaminophen 325 MG tablet  Commonly known as:  TYLENOL  Take 2 tablets (650 mg total) by mouth every 4 (four) hours as needed for headache or mild pain.     albuterol 108 (90 Base) MCG/ACT inhaler  Commonly known as:  PROVENTIL HFA;VENTOLIN HFA  Inhale 2 puffs into the lungs every 6 (six) hours as needed for wheezing or shortness of breath.     aspirin 81 MG EC tablet  Take 1 tablet (81 mg total) by mouth daily.     atorvastatin 40 MG tablet  Commonly known as:  LIPITOR  TAKE 1 TABLET EVERY DAY     bag balm Oint ointment  Apply 1 g topically as needed for dry skin or irritation.     clopidogrel 75 MG tablet  Commonly known as:  PLAVIX  Take 1 tablet (75 mg total) by mouth daily.     clotrimazole 1 % cream  Commonly known as:  LOTRIMIN  Apply 1 application topically daily.     docusate sodium 100 MG capsule  Commonly known as:  COLACE  Take 100 mg by mouth daily.     FASLODEX 250 MG/5ML injection  Generic drug:  fulvestrant  Inject 250 mg into the muscle every 30 (thirty) days. Cancer center     guaifenesin 400 MG Tabs tablet  Commonly known as:  HUMIBID E  Take 1 tablet (400 mg total) by mouth every 4 (four) hours as needed.     HYDROcodone-acetaminophen  5-325 MG tablet  Commonly known as:  NORCO/VICODIN  Take 1 tablet by mouth every 6 (six) hours as needed for moderate pain.     HYDROmorphone 2 MG tablet  Commonly known as:  DILAUDID  Take 1 tablet (2 mg total) by mouth every 4 (four) hours as needed for severe pain.     isosorbide mononitrate 60 MG 24 hr tablet  Commonly known as:  IMDUR  Take 1 tablet (60 mg total) by mouth daily.     metoprolol tartrate 25 MG tablet  Commonly known as:  LOPRESSOR  Take 1 tablet (25 mg total) by mouth 2 (two) times daily.     mirtazapine 15 MG tablet  Commonly known as:  REMERON  Take 1 tablet (15 mg total) by mouth at bedtime.     nitroGLYCERIN 0.4 MG SL tablet  Commonly known as:  NITROSTAT  Place 1 tablet (0.4 mg total) under the tongue every 5 (five) minutes as needed for chest pain.     NON FORMULARY  Place 3 L into the nose as needed (oxygen).     palbociclib 100 MG capsule  Commonly known as:  IBRANCE  Take 1 capsule (100 mg total) by mouth daily with breakfast. Take whole with food.     potassium chloride SA 20 MEQ tablet  Commonly known as:  K-DUR,KLOR-CON  Take 2 tablets (40 mEq total) by mouth 2 (two) times daily.     torsemide 20 MG tablet  Commonly known as:  DEMADEX  Take 1-2 tablets (20-40 mg total) by mouth See admin instructions. 80 mg in the morning 40 mg at night     VITAMIN C PO  Take 500 mg by mouth daily.     VITAMIN D (CHOLECALCIFEROL) PO  Take 1,000 mg by mouth daily.     XGEVA 120 MG/1.7ML Soln injection  Generic drug:  denosumab  Inject 120 mg into the skin once. [facility] at [facility]        SURGICAL HISTORY:  Past Surgical History  Procedure Laterality Date  . Colonoscopy, esophagogastroduodenoscopy (egd) and esophageal dilation N/A 10/13/2012    Procedure: colonoscopy and egd;  Surgeon: Wonda Horner, MD;  Location: [facility];  Service: Endoscopy;  Laterality: N/A;  . Left heart catheterization with coronary angiogram N/A 07/07/2013    Procedure:  LEFT HEART CATHETERIZATION WITH CORONARY ANGIOGRAM;  Surgeon: Peter M Martinique, MD;  Location: [facility];  Service: Cardiovascular;  Laterality: N/A;   REVIEW OF SYSTEMS:   Constitutional: Denies fevers, chills or abnormal weight loss Eyes: Denies blurriness of vision Ears, nose, mouth, throat, and face: Denies mucositis or sore throat Respiratory: Denies cough, (+) stable dyspnea, no wheezes Cardiovascular: Denies palpitation, chest discomfort or lower extremity swelling Gastrointestinal:  Denies nausea, heartburn or change in bowel habits Skin: Denies abnormal skin rashes Lymphatics: Denies new lymphadenopathy or easy bruising Neurological:Denies numbness, tingling or new weaknesses Behavioral/Psych: Mood is stable, no new changes  All other systems were reviewed with the patient and are negative.  PHYSICAL EXAMINATION: ECOG PERFORMANCE STATUS: 3 Blood pressure 104/53, pulse 102, temperature 98.5 F (36.9 C), temperature source Oral, resp. rate 19, height '5\' 2"'$  (1.575 m), weight 218 lb 11.2 oz (99.202 kg), SpO2 98 %. GENERAL:alert, no distress and comfortable morbidly obese woman in wheelchair, chronically ill appearing SKIN: skin color, texture, turgor are normal; multiple skin scratches on her legs  EYES: normal, Conjunctiva are pink and non-injected, sclera clear OROPHARYNX:no exudate, no erythema and lips, buccal mucosa, and tongue normal  NECK: supple, thyroid normal size, non-tender, without nodularity LYMPH:  no palpable lymphadenopathy in the cervical, axillary LUNGS: coarse breathing with slightly increased breathing rate, (+) crackers on bilateral bases HEART: regular rate & rhythm and no murmurs and 2+ lower extremity edema BREAST: deferred ABDOMEN:abdomen soft, non-tender and normal bowel sounds; obese Musculoskeletal:no cyanosis of digits and no clubbing; right lower extremities are wrapped with gauze up to  knees. Left leg exam showed pitting edema to knee, mild diffuse  skin erythema above the ankle, scratch markers, no open wound. Skin temperature is normal. NEURO: alert & oriented x 3 with fluent speech, no focal motor/sensory deficits  LABORATORY DATA: CBC Latest Ref Rng 02/20/2016 02/03/2016 02/02/2016  WBC 3.9 - 10.3 10e3/uL 4.8 7.6 5.2  Hemoglobin 11.6 - 15.9 g/dL 9.9(L) 9.0(L) 8.3(L)  Hematocrit 34.8 - 46.6 % 30.8(L) 28.3(L) 26.3(L)  Platelets 145 - 400 10e3/uL 262 282 247    CMP Latest Ref Rng 02/20/2016 02/03/2016 02/02/2016  Glucose 70 - 140 mg/dl 279(H) 132(H) 286(H)  BUN 7.0 - 26.0 mg/dL 38.0(H) 47(H) 58(H)  Creatinine 0.6 - 1.1 mg/dL 2.0(H) 1.50(H) 1.85(H)  Sodium 136 - 145 mEq/L 138 138 135  Potassium 3.5 - 5.1 mEq/L 3.8 4.9 4.7  Chloride 101 - 111 mmol/L - 107 104  CO2 22 - 29 mEq/L 28 22 19(L)  Calcium 8.4 - 10.4 mg/dL 9.5 9.4 9.1  Total Protein 6.4 - 8.3 g/dL 6.3(L) - -  Total Bilirubin 0.20 - 1.20 mg/dL 0.49 - -  Alkaline Phos 40 - 150 U/L 147 - -  AST 5 - 34 U/L 34 - -  ALT 0 - 55 U/L 65(H) - -   CA 27.29  Status: Finalresult Visible to patient:  Not Released Nextappt: 03/07/2016 at 03:00 PM in Family Medicine [name], DO) Dx:  Anemia of chronic disease; Breast can...              Ref Range 41moago (12/30/15) 260mogo (12/23/15) 44m45moo (11/03/15)    CA 27.29 0.0 - 38.6 U/mL 98.5 (H) 92.2 (H)CM 71.9 (H)CM         PATHOLOGY REPORT  Diagnosis 10/10/2014 1. Bone, biopsy, left iliac crest - METASTATIC MAMMARY CARCINOMA, SEE COMMENT. 2. Breast, right, needle core biopsy - INVASIVE MAMMARY CARCINOMA, SEE COMMENT. Microscopic Comment 1. and 2. The breast biopsy demonstrates definitive morphologic features of invasive mammary carcinoma. Although the grade of tumor is best assessed at resection, with these biopsies, the carcinoma is grade II. An E-Cadherin stain is pending and will be reported as an addendum. The morphology of the metastatic carcinoma in part 2 is identical to the breast biopsy. Breast  prognostic studies are pending (part 2) and will be reported as an addendum. The case was reviewed with Dr. HilAvis Epleyo concurs. The case was discussed with Dr. Ha Lamonte Sakai 10/13/2012.  RADIOGRAPHIC STUDIES:  CT chest abdomen and pelvis 09/01/2014 IMPRESSION: Chest Impression:  1. Stable exam of thorax. 2. Several small smudgy pulmonary nodules are not changed. 3. Stable widespread bony metastasis pneumothorax.  Abdomen / Pelvis Impression:  1. Stable small periaortic retroperitoneal lymph node. 2. No evidence disease progression. 3. Stable widespread skeletal metastasis. 4. Left sided staghorn calculus again demonstrated. 5. Nodular focus in the right kidney at site of prior renal cyst is not changed  PET 02/27/2016 IMPRESSION: 1. The lesion of concern in the proximal left tibial diaphysis demonstrates only low-level hypermetabolism which is only slightly above the metabolic activity in the contralateral tibial diaphysis. This is only one of many lytic lesions noted throughout the visualized axial and appendicular skeleton, which appear to have evolved slightly over several prior examinations, most of which demonstrate little to no increased metabolic activity. While it is possible that these lesions could represent treated osseous metastasis from the patient's breast cancer, breast cancer metastasis tend to be come sclerotic over time and should not progressively increase in size  without demonstrating a corresponding hypermetabolism. This raises concern for potential concurrent primary malignancy such as multiple myeloma. Clinical correlation is recommended. 2. Hypermetabolic right-sided level III cervical lymph node is very similar to the prior examination, as above. 3. No other new signs of extra skeletal metastatic disease are identified in the neck, chest, abdomen or pelvis. 4. Multiple left-sided calculi including a large staghorn calculus measuring 3 cm in the left renal  pelvis. No ureteral stones or findings of urinary tract obstruction are noted at this time. 5. Hepatic steatosis. 6. Aortic atherosclerosis, in addition to three-vessel coronary artery disease. Please note that although the presence of coronary artery calcium documents the presence of coronary artery disease, the severity of this disease and any potential stenosis cannot be assessed on this non-gated CT examination. Assessment for potential risk factor modification, dietary therapy or pharmacologic therapy may be warranted, if clinically indicated. 7. Additional incidental findings, as above. These results were called by telephone at the time of interpretation on 02/27/2016 at 4:05 pm to Dr. Truitt Merle, who verbally acknowledged these results.    ASSESSMENT: 70 year old Caucasian female  PLAN:  1.  Metastatic breast cancer (ER pos/PR neg/ HER2 neg) with metastasis to bones and lungs, possible right cervical nodes.  -I reviewed her restaging PET scan from 02/27/2016 with pt in detailss, which showed minimum hypermetabolism in her diffuse bone metastasis, although some of the lytic bone lesions have increased in size. I have spoken with the radiologist Dr. Weber Cooks who is concerned about multiple myeloma due to the diffuse non-hypermetabolic lytic bone lesions. However she had previous bone biopsy which confirmed metastatic breast cancer. -I'll get a SPEP and light chain level to rule out multiple myeloma, although I have low suspicion  -She has no significant bone pain, giving the PET scan results, I think she is still responding to antiestrogen therapy. I'll continue fulvestrant and Ibrance - lab reviewed with her,stable anemia, slightly worse creatinine, we'll continue monitoring  2. Productive cough  -She has been treated for 2 courses of oral antibiotics, no significant improvement, symptoms has been persistent for a few months now, which makes me think her symptoms could be related to her  CHF  -I encouraged her to follow-up with her cardiologist and pulmonologist.   3. bone metastasis - continue Xgeva, she has been on monthly dose for 1.5 years, will change to every 3 month  -Continue calcium and vitamin D  4. Anemia of chronic disease (CKD and malignancy), and anemia second to Ibrance -- Past work up for reversible causes of anemia were negative.  -Repeated ferritin 185, serum iron 44 (normal) and saturation 16% on 09/15/2015  -She is not taking iron pill, could not tolerate.  -I do not recommend ESA, due to her underline malignancy  -anemia improved after blood transfusion on 09/17/2015, but not much after 1U RBC in 11/2015 -continue monitoring.   5. CHF -she has slightly worsening dyspnea, bilateral lung rales on exam, probable pulmonary edema. -I encouraged her to call her cardiologist for follow-up.  6. Diabetes mellitus, type II.  -not well not medication, diet controlled most of time  7. COPD: On albuterol prn. She remains on home oxygen.  8. Chronic renal insufficiency, stage IV   -Stable. -she will follow-up with Dr. Florene Glen  9: Bilateral leg venous stasis ulcers -She was recently discharged from wound clinic  -follow up with PCP   10. Mild leukopenia and thrombocytopenia -Secondary to [facility] continue monitoring.  All questions were answered. The  patient knows to call the clinic with any problems, questions or concerns. We can certainly see the patient much sooner if necessary.  Plan -She'll continue Ibrance and monthly fulvestrant injection  -Change Xgeva to every 3 months, next due in September -I'll see her back in 4 weeks with lab, including CBC, CMP    I spent 25 minutes counseling the patient face to face. The total time spent in the appointment was 30 minutes.   Truitt Merle  03/02/2016

## 2016-03-02 NOTE — Telephone Encounter (Signed)
per pof to sch pt appt-gave pt copy of avs °

## 2016-03-05 NOTE — Telephone Encounter (Signed)
Second pof sent 7/7 requested lab/fu/inj on 8/1. Appointment date/time scheduled on 7/7 per patient request according to when she could come in for each appointment. Message left informing desk nurse - appointments remain as originally scheduled.

## 2016-03-06 ENCOUNTER — Telehealth: Payer: Self-pay | Admitting: Family Medicine

## 2016-03-06 NOTE — Telephone Encounter (Signed)
Left message i would be meeting with her and Dr Juleen China and to bring all her medications

## 2016-03-07 ENCOUNTER — Ambulatory Visit (INDEPENDENT_AMBULATORY_CARE_PROVIDER_SITE_OTHER): Payer: Commercial Managed Care - HMO | Admitting: Internal Medicine

## 2016-03-07 ENCOUNTER — Encounter: Payer: Self-pay | Admitting: Internal Medicine

## 2016-03-07 VITALS — BP 127/55 | HR 100 | Temp 98.8°F | Ht 62.0 in | Wt 214.0 lb

## 2016-03-07 DIAGNOSIS — H269 Unspecified cataract: Secondary | ICD-10-CM | POA: Diagnosis not present

## 2016-03-07 DIAGNOSIS — R05 Cough: Secondary | ICD-10-CM | POA: Diagnosis not present

## 2016-03-07 DIAGNOSIS — L280 Lichen simplex chronicus: Secondary | ICD-10-CM

## 2016-03-07 DIAGNOSIS — C50919 Malignant neoplasm of unspecified site of unspecified female breast: Secondary | ICD-10-CM

## 2016-03-07 DIAGNOSIS — R059 Cough, unspecified: Secondary | ICD-10-CM

## 2016-03-07 DIAGNOSIS — C7951 Secondary malignant neoplasm of bone: Secondary | ICD-10-CM

## 2016-03-08 ENCOUNTER — Other Ambulatory Visit: Payer: Self-pay | Admitting: *Deleted

## 2016-03-08 ENCOUNTER — Encounter: Payer: Self-pay | Admitting: Internal Medicine

## 2016-03-08 DIAGNOSIS — R059 Cough, unspecified: Secondary | ICD-10-CM | POA: Insufficient documentation

## 2016-03-08 DIAGNOSIS — H269 Unspecified cataract: Secondary | ICD-10-CM | POA: Insufficient documentation

## 2016-03-08 DIAGNOSIS — R05 Cough: Secondary | ICD-10-CM | POA: Insufficient documentation

## 2016-03-08 DIAGNOSIS — L280 Lichen simplex chronicus: Secondary | ICD-10-CM | POA: Insufficient documentation

## 2016-03-08 MED ORDER — OMEPRAZOLE 40 MG PO CPDR: 40.0000 mg | DELAYED_RELEASE_CAPSULE | Freq: Every day | ORAL | Status: DC

## 2016-03-08 MED ORDER — METOPROLOL TARTRATE 25 MG PO TABS: 25.0000 mg | ORAL_TABLET | Freq: Two times a day (BID) | ORAL | Status: DC

## 2016-03-08 MED ORDER — HYDROCORTISONE 2.5 % EX OINT: TOPICAL_OINTMENT | Freq: Two times a day (BID) | CUTANEOUS | Status: DC

## 2016-03-08 MED ORDER — NITROGLYCERIN 0.4 MG SL SUBL: 0.4000 mg | SUBLINGUAL_TABLET | SUBLINGUAL | Status: AC | PRN

## 2016-03-08 MED ORDER — FLUTICASONE PROPIONATE 50 MCG/ACT NA SUSP: 2.0000 | Freq: Every day | NASAL | Status: AC

## 2016-03-08 MED ORDER — CLOPIDOGREL BISULFATE 75 MG PO TABS: 75.0000 mg | ORAL_TABLET | Freq: Every day | ORAL | Status: DC

## 2016-03-08 NOTE — Telephone Encounter (Signed)
Pt calling in to inform dr that she is seeing a lung doctor on Thursday 7/20. Loida Calamia Kennon Holter, CMA

## 2016-03-08 NOTE — Progress Notes (Signed)
Subjective:    Anita [name] - 70 y.o. female MRN [id number]  Date of birth: [birth year]  HPI  Anita [name] is here for a TOPC visit. Additional social history, including end of life discussion, was obtained and updated in the EMR. Please see social history tab for more information.   Cough: Present for almost 2 months and sometimes productive. Does endorse nasal congestion. Feels phlegm in her throat but denies sensation of something dripping down the back of her throat from her nose. Notices that with drinking water her cough worsens and sometimes water comes out of her nose. This doses not happen with other liquids like juice or soda. Denies feeling more SOB. Afebrile at home. Has been using Albuterol inhaler every 4 hours with some relief.   Rash: Present on her arms and on her upper back. Extremely itchy. Asks about seeing a dermatologist. Has not been taking Vicodin or Dilaudid for new bone metastasis prescribed at discharge from hospitalization.   Cataracts: Reports that she is having worsening vision. Followed by eye doctor who told her she has cataracts. Questions other options for cataract treatment besides surgery. Adamantly states that she is "not a good candidate for surgery" and that she wishes to avoid surgeries if possible. She has a follow up appointment scheduled with her eye doctor later this month.   -  reports that she quit smoking about 3 years ago. Her smoking use included Cigarettes. She started smoking about 54 years ago. She has a 100 pack-year smoking history. She has never used smokeless tobacco. - Review of Systems: Per HPI. - Past Medical History: Patient Active Problem List   Diagnosis Date Noted  . Cough 03/08/2016  . Cataracts, bilateral 03/08/2016  . 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 Chronic combined systolic and diastolic congestive heart failure ([place]) 02/09/2016  . Lytic lesion of bone on x-ray 02/09/2016  . Shortness of breath 02/09/2016  . NSTEMI (non-ST  elevated myocardial infarction) ([place])   . Pain in the chest   . Chest pain 02/01/2016  . Acute on chronic congestive heart failure ([place])   . Elevated troponin   . COPD exacerbation ([place]) 12/01/2015  . Erythema of lower extremity 12/01/2015  . Palliative care encounter   . Wound cellulitis   . Severe sepsis ([place]) 05/12/2015  . Sepsis ([place]) 05/12/2015  . AKI (acute kidney injury) ([place]) 05/12/2015  . Leg wound, right 05/12/2015  . Laceration   . Absolute anemia   . CAD in native artery   . Venous stasis ulcer ([place])   . COLD (chronic obstructive lung disease) ([place])   . Leg laceration 04/04/2015  . Laceration of lower extremity 04/04/2015  . Venous stasis of lower extremity 12/05/2014  . Intertrigo 02/19/2014  . 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 Hyperlipidemia 02/11/2013  . Breast cancer metastasized to bone ([place]) 10/14/2012  . Chronic respiratory failure ([place]) 10/06/2012  . Chronic diastolic heart failure ([place]) 10/06/2012  . CAD (coronary artery disease) 10/06/2012  . CKD (chronic kidney disease), stage IV ([place]) 10/06/2012  . Anemia of chronic disease 10/06/2012  . Breast mass, right 10/06/2012  . History of tobacco abuse 10/06/2012  . COPD (chronic obstructive pulmonary disease) ([place]) 09/27/2012  . DM2 (diabetes mellitus, type 2) ([place]) 09/26/2012   - Medications: reviewed and updated    Objective:   Physical Exam BP 127/55 mmHg  Pulse 100  Temp(Src) 98.8 F (37.1 C) (Oral)  Ht 5\' 2"  (1.575 m)  Wt 214 lb (97.07 kg)  BMI 39.13 kg/m2  SpO2 96% Gen: NAD, alert, cooperative with exam, well-appearing HEENT: NCAT, PERRL, clear conjunctiva, oropharynx clear, nares patent  Resp: On O2 therapy. No cough while in room. Clears wet congestion from throat frequently. Skin: Areas of excoriation/scabbing on extensor surfaces of forearm bilaterally. Excoriations/scabbing present on upper back bilaterally above scapula.        Assessment & Plan:   Cough Patient has been treated  for COPD exacerbation x2 as well as CHF exacerbation with an increase in home Torsemide dosing without an improvement in cough. Now lower suspicion that cough is related to cardiac cause or chronic pulmonary disease. Potentially related to post-nasal drip vs. GERD vs. Aspiration. Will treat for nasal congestion and GERD as potential causes of cough. Description of cough worsening after water consumption potentially concerning for aspiration leading to cough.  -flonase Rx -PPI Rx  -speech therapy evaluation ordered for swallow study  -patient with appointments scheduled with cardiology and pulmonology, will f/u on their recommendations   Cataracts, bilateral Discussed difference in cataract surgery as opposed to major surgical procedures. Discussed that cataract surgery usually did not require general anesthesia, which would be the risk for surgery this patient given her significant medical history. Talked about how usually the only option for management is removal.  -encouraged patient to f/u with eye doctor  -encouraged patient to discuss cataract removal with cardiologist   Neurodermatitis Rash appears consistent with neurodermatitis as it is limited to areas that patient can reach to scratch. No other findings besides evidence of scratching.  -hydrocortisone ointment ordered    May benefit from an annual wellness visit through [facility].   Developed plan that Anita [name] is to follow up with PCP at [facility] every 2-2 months.   Phill Myron, D.O. 03/08/2016, 10:28 AM PGY-2, [place]

## 2016-03-08 NOTE — Patient Outreach (Signed)
Weekly transition of care program placed to member.  She continues to have cough, but state that her sputum productions is clear/white.  She state she has completed her course of antibiotics and steroids, but continue to have cough.  She report that her PCP is aware and will be assessing further to determine the cause.  She state that her PCP visit was a "different kind of appointment."  She report that was told that her condition was not improving and her code status was addressed.  She state that her daughter is aware of her wishes and will "take care of me."  [facility] discussed with member, she is open to obtaining more information about involvement.  Member state she has been checking her blood sugar periodically, stating that it is "better" and was 195 "the other day."  She state she continues to check her weight, which was 214 yesterday (down 1 pound from the previous day).  She is aware of when to contact the physician.  Will refer to care connections and follow up next week with weekly transition of care call.  Valente David, [place], MSN [place] [phone]

## 2016-03-08 NOTE — Telephone Encounter (Signed)
Patient called back and states that she needs new scripts for her plavix and metoprolol sent to [facility] order.  Will forward to MD. Johnney Ou

## 2016-03-08 NOTE — Assessment & Plan Note (Signed)
Discussed difference in cataract surgery as opposed to major surgical procedures. Discussed that cataract surgery usually did not require general anesthesia, which would be the risk for surgery this patient given her significant medical history. Talked about how usually the only option for management is removal.  -encouraged patient to f/u with eye doctor  -encouraged patient to discuss cataract removal with cardiologist

## 2016-03-08 NOTE — Telephone Encounter (Signed)
error 

## 2016-03-08 NOTE — Assessment & Plan Note (Signed)
Rash appears consistent with neurodermatitis as it is limited to areas that patient can reach to scratch. No other findings besides evidence of scratching.  -hydrocortisone ointment ordered

## 2016-03-08 NOTE — Telephone Encounter (Signed)
Patient called and states that her nitro is expired and she would like a new script called in. [facility]

## 2016-03-08 NOTE — Assessment & Plan Note (Addendum)
Patient has been treated for COPD exacerbation x2 as well as CHF exacerbation with an increase in home Torsemide dosing without an improvement in cough. Now lower suspicion that cough is related to cardiac cause or chronic pulmonary disease. Potentially related to post-nasal drip vs. GERD vs. Aspiration. Will treat for nasal congestion and GERD as potential causes of cough. Description of cough worsening after water consumption potentially concerning for aspiration leading to cough.  -flonase Rx -PPI Rx  -speech therapy evaluation ordered for swallow study  -patient with appointments scheduled with cardiology and pulmonology, will f/u on their recommendations

## 2016-03-08 NOTE — Telephone Encounter (Signed)
I already sent Rx for Nitro this morning.   Phill Myron, D.O. 03/08/2016, 1:50 PM PGY-2, [place]

## 2016-03-08 NOTE — Telephone Encounter (Signed)
Refills for Plavix and Metoprolol sent.   Phill Myron, D.O. 03/08/2016, 6:05 PM PGY-2, [place]

## 2016-03-08 NOTE — Telephone Encounter (Signed)
Pt notified. Ivon Roedel T Draken Farrior, CMA   

## 2016-03-09 ENCOUNTER — Telehealth: Payer: Self-pay | Admitting: *Deleted

## 2016-03-09 ENCOUNTER — Telehealth: Payer: Self-pay

## 2016-03-09 NOTE — Telephone Encounter (Signed)
Pt calling in wanting know what are you going to do about her sugars being high. When she took her sugar this morning it was 205 before she ate. Wants you to give her medicine. Deseree Kennon Holter, CMA

## 2016-03-09 NOTE — Telephone Encounter (Signed)
Called [facility] and LVM to schedule appt for modified barium swallow test. Ottis Stain, CMA

## 2016-03-09 NOTE — Telephone Encounter (Signed)
Pt informed of below. Anita [name], [name] D, CMA  

## 2016-03-11 ENCOUNTER — Telehealth: Payer: Self-pay | Admitting: Family Medicine

## 2016-03-11 NOTE — Telephone Encounter (Signed)
Received call from patient on outside line. Patient recently started using lidocaine cream and was concerned that she would overdose on it. I told patient that it would be very difficult for her to overdose on the medication if she were to use it as directed and only use it topically. Patient voiced understanding and had no further questions.   Algis Greenhouse. Jerline Pain, [place] Resident PGY-3 03/11/2016 10:33 PM

## 2016-03-12 DIAGNOSIS — E11621 Type 2 diabetes mellitus with foot ulcer: Secondary | ICD-10-CM | POA: Diagnosis not present

## 2016-03-12 NOTE — Telephone Encounter (Signed)
Patient called back and states that [facility]  She is not sure how they were able to send this since she doesn't use them at all.  Will just send to MD to see if this request came through as a fax.

## 2016-03-13 ENCOUNTER — Telehealth: Payer: Self-pay | Admitting: *Deleted

## 2016-03-13 NOTE — Telephone Encounter (Signed)
Resending to PCP to be advised. Katharina Caper, April D, [place]

## 2016-03-13 NOTE — Telephone Encounter (Signed)
Pt is being seen for speech and swallowing, pt is reporting that she is having liquids coming out of nose when swallowing. She thinks pt needs an ENT consult and swallow test, pt wanted time to think about them. Pt has refused todays visit and vanessa is going to try a reschedule. She just wanted to let the dr know. Deseree Kennon Holter, CMA

## 2016-03-15 ENCOUNTER — Other Ambulatory Visit: Payer: Self-pay | Admitting: *Deleted

## 2016-03-15 ENCOUNTER — Encounter: Payer: Self-pay | Admitting: Pulmonary Disease

## 2016-03-15 ENCOUNTER — Telehealth: Payer: Self-pay | Admitting: *Deleted

## 2016-03-15 ENCOUNTER — Ambulatory Visit: Payer: Self-pay | Admitting: *Deleted

## 2016-03-15 ENCOUNTER — Ambulatory Visit: Payer: Commercial Managed Care - HMO | Admitting: Internal Medicine

## 2016-03-15 ENCOUNTER — Telehealth: Payer: Self-pay | Admitting: Internal Medicine

## 2016-03-15 ENCOUNTER — Ambulatory Visit (INDEPENDENT_AMBULATORY_CARE_PROVIDER_SITE_OTHER): Payer: Commercial Managed Care - HMO | Admitting: Pulmonary Disease

## 2016-03-15 VITALS — BP 108/60 | HR 92 | Ht 62.0 in | Wt 197.0 lb

## 2016-03-15 DIAGNOSIS — C50919 Malignant neoplasm of unspecified site of unspecified female breast: Secondary | ICD-10-CM | POA: Diagnosis not present

## 2016-03-15 DIAGNOSIS — R05 Cough: Secondary | ICD-10-CM

## 2016-03-15 DIAGNOSIS — R058 Other specified cough: Secondary | ICD-10-CM

## 2016-03-15 DIAGNOSIS — R053 Chronic cough: Secondary | ICD-10-CM

## 2016-03-15 DIAGNOSIS — E662 Morbid (severe) obesity with alveolar hypoventilation: Secondary | ICD-10-CM

## 2016-03-15 DIAGNOSIS — J9611 Chronic respiratory failure with hypoxia: Secondary | ICD-10-CM

## 2016-03-15 DIAGNOSIS — C7951 Secondary malignant neoplasm of bone: Secondary | ICD-10-CM

## 2016-03-15 DIAGNOSIS — J449 Chronic obstructive pulmonary disease, unspecified: Secondary | ICD-10-CM

## 2016-03-15 DIAGNOSIS — J4489 Other specified chronic obstructive pulmonary disease: Secondary | ICD-10-CM

## 2016-03-15 NOTE — Progress Notes (Signed)
Current Outpatient Prescriptions on File Prior to Visit  Medication Sig  . ACCU-CHEK AVIVA PLUS test strip   . acetaminophen (TYLENOL) 325 MG tablet Take 2 tablets (650 mg total) by mouth every 4 (four) hours as needed for headache or mild pain.  [name] albuterol (PROVENTIL HFA;VENTOLIN HFA) 108 (90 BASE) MCG/ACT inhaler Inhale 2 puffs into the lungs every 6 (six) hours as needed for wheezing or shortness of breath.  [name] aspirin EC 81 MG EC tablet Take 1 tablet (81 mg total) by mouth daily.  [name] atorvastatin (LIPITOR) 40 MG tablet TAKE 1 TABLET EVERY DAY  . bag balm OINT ointment Apply 1 g topically as needed for dry skin or irritation.  . clopidogrel (PLAVIX) 75 MG tablet Take 1 tablet (75 mg total) by mouth daily.  . clotrimazole (LOTRIMIN) 1 % cream Apply 1 application topically daily.  [name] denosumab (XGEVA) 120 MG/1.7ML SOLN injection Inject 120 mg into the skin once. Monthly at [facility]  . docusate sodium (COLACE) 100 MG capsule Take 100 mg by mouth daily.  . fluticasone (FLONASE) 50 MCG/ACT nasal spray Place 2 sprays into both nostrils daily.  . fulvestrant (FASLODEX) 250 MG/5ML injection Inject 250 mg into the muscle every 30 (thirty) days. Cancer center  . HYDROcodone-acetaminophen (NORCO/VICODIN) 5-325 MG tablet Take 1 tablet by mouth every 6 (six) hours as needed for moderate pain.  . hydrocortisone 2.5 % ointment Apply topically 2 (two) times daily.  [name] HYDROmorphone (DILAUDID) 2 MG tablet Take 1 tablet (2 mg total) by mouth every 4 (four) hours as needed for severe pain.  . isosorbide mononitrate (IMDUR) 60 MG 24 hr tablet Take 1 tablet (60 mg total) by mouth daily.  . metoprolol tartrate (LOPRESSOR) 25 MG tablet Take 1 tablet (25 mg total) by mouth 2 (two) times daily.  . mirtazapine (REMERON) 15 MG tablet Take 1 tablet (15 mg total) by mouth at bedtime.  . nitroGLYCERIN (NITROSTAT) 0.4 MG SL tablet Place 1 tablet (0.4 mg total) under the tongue every 5 (five) minutes as needed for chest pain.  .  NON FORMULARY Place 3 L into the nose as needed (oxygen).   [name] omeprazole (PRILOSEC) 40 MG capsule Take 1 capsule (40 mg total) by mouth daily.  . palbociclib (IBRANCE) 100 MG capsule Take 1 capsule (100 mg total) by mouth daily with breakfast. Take whole with food.  . potassium chloride SA (K-DUR,KLOR-CON) 20 MEQ tablet Take 2 tablets (40 mEq total) by mouth 2 (two) times daily.  [name] torsemide (DEMADEX) 20 MG tablet Take 1-2 tablets (20-40 mg total) by mouth See admin instructions. 80 mg in the morning 40 mg at night (Patient taking differently: Take 80 mg by mouth 2 (two) times daily. 80 mg in the morning 40 mg at night)  . VITAMIN D, CHOLECALCIFEROL, PO Take 1,000 mg by mouth daily.    No current facility-administered medications on file prior to visit.    Chief Complaint  Patient presents with  . Acute Visit    Pt reports having a cough x 2 months with a lot of clear to white colored mucus. Chest congestion and SOB. Using Flonase qd. Has taken Levaquin, Doxy and Prednisone over the last 2 months - pt states that none if these meds have helped her symptoms. Denies chest tightness and wheezing.    Pulmonary tests CT chest 10/08/12 >> 6 mm GGO nodule RUL, diffuse sclerotic changes on bone windows ONO with RA 10/23/12 >> Test time 7 hrs 53 min. Mean SpO2  89.3%, low SpO2 66%. Spent 46 min with SpO2 < 88%. CT chest 09/01/14 >> 5 mm RUL nodule, 5 mm LUL nodule, multiple lytic lesions in spine/sternum/shoulder Spirometry 08/11/15 >> FEV1 0.57 (27%), FEV1% 78  Cardiac tests Echo 09/25/12 >> grade 2 diastolic dysfunction, mild RV dilation, EF 55%  Past medical history CAD, Diastolic CHF, HLD, CKD, DM, Depression, Stage IV breast cancer, Stage III laryngeal carcinoma  Past surgical history, Family history, Social history, Allergies reviewed.  Vital signs BP 108/60 mmHg  Pulse 92  Ht 5\' 2"  (1.575 m)  Wt 197 lb (89.359 kg)  BMI 36.02 kg/m2  SpO2 92%  History of Present Illness: Anita [name]  is a 70 y.o. female former smoker with COPD and possible sleep apnea using supplemental oxygen at night.  She also has history of stage IV breast cancer, laryngeal cancer, and diastolic CHF.  She has cough for at least the last 2 months.  She has sinus congestion with post-nasal drip.  She brings up clear sputum.  She denies chest pain, wheeze, fever, or hemoptysis.  She has not had symptoms of reflux.  She has been on several courses of prednisone and antibiotics w/o improvement.  She has been on diuretics, again w/o improvement.  She is being set up for speech therapy assessment through her PCP.  She has been followed by oncology for metastatic breast cancer.  In review of her medications the following have been associated with cough:    Xgeva - 15% incidence of cough  Faslodex - 5% incidence of cough.  She has been using albuterol every 4 hours.  She finds her nebulizer works better than inhaler, but neither seem to make much difference with cough.  She started using flonase yesterday.  She was unable to do fractional excretion of nitric oxide test >> couldn't perform test maneuver.  Physical Exam:  General - No distress, wearing oxygen, sitting in chair ENT - No sinus tenderness, clear nasal discharge, MP 4, no oral exudate, no LAN, hoarse voice Cardiac - s1s2 regular, no murmur Chest - No wheeze/rales/dullness Back - No focal tenderness Abd - Soft, non-tender Ext - 2+ leg edema Neuro - Normal strength Skin - no rashes Psych - normal mood, and behavior  Discussion: She has developed persistent cough.  She has been on therapy for COPD and CHF w/o improvement.  Her symptoms are suggestive of upper airway cough syndrome with post-nasal drip.  She was recently started on nasal steroids.  She is on two medications for breast cancer (xgeva and faslodex) which have been associated with cough.  Another possibility is reflux and aspiration >> she denies symptoms of reflux and her recent CT  chest during PET scan didn't show significant evidence for aspiration.  Assessment/Plan:  Upper airway cough syndrome with post-nasal drip. - continue flonase  COPD with chronic bronchitis. - Intolerant of LABAs due to palpitations, and no benefit from prior use of LAMAs - continue prn albuterol  Possible aspiration. - she has been set up with speech therapy through PCP  Metastatic breast cancer. - if her cough persists, then might need to have oncology address whether she could have trial off of xgeva and/or faslodex  Chronic hypoxic respiratory failure likely related obesity/hypoventilation syndrome. - continue supplemental oxygen with exertion and sleep    Patient Instructions  Continue flonase two sprays  Follow up in 2 months     Chesley Mires, MD [place] [facility] Pager:  [phone] 03/15/2016, 11:32 AM

## 2016-03-15 NOTE — Telephone Encounter (Signed)
Chnatal from [facility] called because the patient has a 11:00 AM appointment and they need a [facility] referral for her so that they can see her. [place] only needs 1 visit since this is acute visit. She will be seeing Dr. Llana Aliment. Please call Chantal if you have ant questions. jw

## 2016-03-15 NOTE — Telephone Encounter (Signed)
Attempted to call Manus Gunning for verbal order but number was busy.   Phill Myron, D.O. 03/15/2016, 4:18 PM PGY-2, [place]

## 2016-03-15 NOTE — Telephone Encounter (Signed)
Attempted to call patient to discuss her CBGs. No answer.   Patient needs to have an appointment to discuss her blood sugars.   Phill Myron, D.O. 03/15/2016, 11:33 AM PGY-2, [place]

## 2016-03-15 NOTE — Patient Instructions (Addendum)
Continue flonase two sprays  Follow up in 2 months

## 2016-03-15 NOTE — Patient Outreach (Signed)
Weekly transition of care call placed to member, no answer, unable to leave a message.  Noted after call that member is currently at the pulmonologist office for visit.  Will follow up next week.  Valente David, [place], MSN [place] [phone]

## 2016-03-15 NOTE — Telephone Encounter (Signed)
Pt stated she went to her pulmonologist and was given a nasal spray, but is worried about the side effects of mixing it with another medication she is on. She would like someone to call her and discuss this. ep

## 2016-03-15 NOTE — Telephone Encounter (Signed)
[facility] called to request verbal order of [facility] base palliative care for patient.  Patient was referred by [employer].  This is only for palliative care and full hospice services per [place].  Please give her a call if PCP is agreeable to the referral [phone].  Derl Barrow, RN

## 2016-03-15 NOTE — Telephone Encounter (Signed)
Referral entered [id number]

## 2016-03-16 ENCOUNTER — Telehealth: Payer: Self-pay

## 2016-03-16 NOTE — Telephone Encounter (Signed)
Pt called again with same message. Requesting a call back. She sees Dr Halford Chessman at [facility].

## 2016-03-16 NOTE — Telephone Encounter (Signed)
Pt called in and left a message for Dr Lewayne Bunting nurse but it was unclear what was said.  Returned call to pt to clarify.  Pt states she saw her lung dr  Wilburn Mylar and he believes her non-productive cough is a side effect of the xgeva and faslodex.  He ordered her to continue using her flonase nasal spray and return for a follow up in 2 mos.  Told to pt to call us back if her symptoms worsen or if she had anything else to report.  Pt verbalized understanding.

## 2016-03-16 NOTE — Telephone Encounter (Signed)
Verbal order fir palliative care given to Manus Gunning at [facility] by Dr. Juleen China.  Derl Barrow, RN

## 2016-03-19 ENCOUNTER — Telehealth: Payer: Self-pay | Admitting: *Deleted

## 2016-03-19 DIAGNOSIS — R05 Cough: Secondary | ICD-10-CM

## 2016-03-19 DIAGNOSIS — R053 Chronic cough: Secondary | ICD-10-CM

## 2016-03-19 DIAGNOSIS — R1310 Dysphagia, unspecified: Secondary | ICD-10-CM

## 2016-03-19 NOTE — Telephone Encounter (Signed)
Wes from Dr. Berle Mull office with ENT called stating that patient needs an updated referral to them since she has [employer].  Tried to call their office back to check on the ICD10 code they had been using for her on file but the office closes at 4p.  Will forward to MD to place this referral based off of a recent visit if possible.  If not then we can try and reach Dr. Berle Mull office later this week, since patient is asking for this. Jazmin Hartsell,CMA

## 2016-03-19 NOTE — Telephone Encounter (Signed)
Attempted to call patient to discuss nasal spray prescribed by pulmonology. There was no answer.   I believe she is referring to [facility]. If that is the case, it is very safe for her to use the Flonase and it should not interact with her other medications. If she has more specific questions I would be happy to speak to her.   Phill Myron, D.O. 03/19/2016, 9:13 AM PGY-2, [place]

## 2016-03-20 ENCOUNTER — Other Ambulatory Visit: Payer: Self-pay | Admitting: Internal Medicine

## 2016-03-20 ENCOUNTER — Telehealth: Payer: Self-pay | Admitting: *Deleted

## 2016-03-20 DIAGNOSIS — R05 Cough: Secondary | ICD-10-CM

## 2016-03-20 DIAGNOSIS — R059 Cough, unspecified: Secondary | ICD-10-CM

## 2016-03-20 NOTE — Telephone Encounter (Signed)
Pt calling wanting to ask the dr when her cough is going to go away. Deseree Kennon Holter, CMA

## 2016-03-21 ENCOUNTER — Other Ambulatory Visit: Payer: Self-pay | Admitting: *Deleted

## 2016-03-21 ENCOUNTER — Encounter: Payer: Self-pay | Admitting: *Deleted

## 2016-03-21 NOTE — Telephone Encounter (Signed)
ENT referral placed.   Attempted to call patient to discuss that she should continue to use Flonase and that ENT referral may be helpful for her cough. There is nothing I can prescribe over the phone.It would probably be helpful for her to make another appointment to see me. She is also supposed to f/u with oncology regarding some medications that could cause cough per pulmonology recommendations.  No answer when calling patient.   Phill Myron, D.O. 03/21/2016, 4:07 PM PGY-2, [place]

## 2016-03-21 NOTE — Progress Notes (Signed)
HPI  The patient presents for follow up after a hospitalization for treatment of dyspnea and anasarca.   Since I last saw her she was hospitalized in June with chest pain.  I reviewed these records for this visit.  She did rule in for a NQWMI.  Her EF was reduced at 25 - 30%.  However, she was not deemed to be a candidate for invasive evaluation and she was managed medically.  She returns for follow up.  She says she is breathing better than when she went to the hospital although she has this cough productive of a thick phlegm. I reviewed the pulmonary notes and they did not think this was related to her chronic lung disease and question whether it could be related to her medications as I mentioned below. There was a small question of aspiration possibly leading to this.  She's not describing new PND or orthopnea though she chronically sleeps in chairs. Her weights have been stable as she was 213 pounds when she left the hospital and 210 today. Her lower extremity swelling is actually mild for her. She's not having any new chest pressure, neck or arm discomfort. She's not having any palpitations, presyncope or syncope.   Allergies  Allergen Reactions  . Omnipaque [Iohexol] Hives and Other (See Comments)    Pt claims she developed hives after given contrast  . Benzene Rash    Current Outpatient Prescriptions  Medication Sig Dispense Refill  . ACCU-CHEK AVIVA PLUS test strip     . acetaminophen (TYLENOL) 325 MG tablet Take 2 tablets (650 mg total) by mouth every 4 (four) hours as needed for headache or mild pain. 30 tablet 0  . albuterol (PROVENTIL HFA;VENTOLIN HFA) 108 (90 BASE) MCG/ACT inhaler Inhale 2 puffs into the lungs every 6 (six) hours as needed for wheezing or shortness of breath. 3 Inhaler 3  . aspirin EC 81 MG EC tablet Take 1 tablet (81 mg total) by mouth daily. 30 tablet 3  . atorvastatin (LIPITOR) 40 MG tablet TAKE 1 TABLET EVERY DAY 90 tablet 3  . bag balm OINT ointment Apply  1 g topically as needed for dry skin or irritation. 300 g 11  . clopidogrel (PLAVIX) 75 MG tablet Take 1 tablet (75 mg total) by mouth daily. 30 tablet 3  . clotrimazole (LOTRIMIN) 1 % cream Apply 1 application topically daily. 113 g 3  . denosumab (XGEVA) 120 MG/1.7ML SOLN injection Inject 120 mg into the skin once. Monthly at [facility]    . docusate sodium (COLACE) 100 MG capsule Take 100 mg by mouth daily.    . fluticasone (FLONASE) 50 MCG/ACT nasal spray Place 2 sprays into both nostrils daily. 16 g 6  . fulvestrant (FASLODEX) 250 MG/5ML injection Inject 250 mg into the muscle every 30 (thirty) days. Cancer center    . HYDROcodone-acetaminophen (NORCO/VICODIN) 5-325 MG tablet Take 1 tablet by mouth every 6 (six) hours as needed for moderate pain. 20 tablet 0  . hydrocortisone 2.5 % ointment Apply topically 2 (two) times daily. 30 g 0  . HYDROmorphone (DILAUDID) 2 MG tablet Take 1 tablet (2 mg total) by mouth every 4 (four) hours as needed for severe pain. 60 tablet 0  . isosorbide mononitrate (IMDUR) 60 MG 24 hr tablet Take 1 tablet (60 mg total) by mouth daily. 90 tablet 3  . metoprolol tartrate (LOPRESSOR) 25 MG tablet Take 1 tablet (25 mg total) by mouth 2 (two) times daily. 60 tablet 3  . mirtazapine (  REMERON) 15 MG tablet Take 1 tablet (15 mg total) by mouth at bedtime. 30 tablet 5  . nitroGLYCERIN (NITROSTAT) 0.4 MG SL tablet Place 1 tablet (0.4 mg total) under the tongue every 5 (five) minutes as needed for chest pain. 30 tablet 0  . NON FORMULARY Place 3 L into the nose as needed (oxygen).     [name] omeprazole (PRILOSEC) 40 MG capsule TAKE 1 CAPSULE EVERY DAY 90 capsule 3  . palbociclib (IBRANCE) 100 MG capsule Take 1 capsule (100 mg total) by mouth daily with breakfast. Take whole with food. 21 capsule 0  . potassium chloride SA (K-DUR,KLOR-CON) 20 MEQ tablet Take 2 tablets (40 mEq total) by mouth 2 (two) times daily. 360 tablet 6  . torsemide (DEMADEX) 20 MG tablet Take 80 mg by mouth 2 (two)  times daily.    [name] VITAMIN D, CHOLECALCIFEROL, PO Take 1,000 mg by mouth daily.      No current facility-administered medications for this visit.     Past Medical History:  Diagnosis Date  . Anemia    a. Adm 09/2012 with melena, Hgb 5.8 -> transfused. EGD/colonoscopy unrevealing.  . Anemia of chronic disease   . Breast cancer ([place])    a. Mets to bone. ER 100%/ PR 0%/Her2 neu negative.  [name] CAD (coronary artery disease)    a. 08/2012 NSTEMI: Med Rx; b. 10/2012 MV: EF 49%, old inf infarct;  c. 02/2013 NSTEMI-> Med Rx; d. 06/2013 Cath: LM nl, LAD 40p, 80-85p/m, D1 nl, LCX 20p, OM1 nl, RCA 100d w/ L->R collats, EF 30-35%, basal to mid inf AK, ant HK.  . Chronic diastolic CHF (congestive heart failure) ([place])    a. 01/2014 Echo: EF 55%, triv PR.  [name] Chronic respiratory failure ([place])    a. On O2 qhs and w/ exertion.  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 CKD (chronic kidney disease), stage IV ([place])   . COPD (chronic obstructive pulmonary disease) ([place])    a. Uses Home O2 w/ exertion.  . Dementia    very mild  . Depression   . Diabetes mellitus without complication ([place])   . Fibroid   . History of tobacco abuse    a. Quit 2014.  [name] Hyperlipidemia 02/11/2013  . Lesion of vocal cord    a. CT 05/2013 concerning for tumor.  . Morbid obesity ([place])   . Radiation 02/02/14-02/24/14   left and right femur 30 gray  . Ulcers of both lower legs [facility])     Past Surgical History:  Procedure Laterality Date  . COLONOSCOPY, ESOPHAGOGASTRODUODENOSCOPY (EGD) AND ESOPHAGEAL DILATION N/A 10/13/2012   Procedure: colonoscopy and egd;  Surgeon: Wonda Horner, MD;  Location: [place];  Service: Endoscopy;  Laterality: N/A;  . LEFT HEART CATHETERIZATION WITH CORONARY ANGIOGRAM N/A 07/07/2013   Procedure: LEFT HEART CATHETERIZATION WITH CORONARY ANGIOGRAM;  Surgeon: Peter M Martinique, MD;  Location: [facility] CATH LAB;  Service: Cardiovascular;  Laterality: N/A;    ROS:  As stated in  the HPI and negative for all other systems.  PHYSICAL EXAM BP (!) 98/52   Pulse 78   Ht '5\' 2"'$  (1.575 m)   Wt 210 lb (95.3 kg)   SpO2 96%   BMI 38.41 kg/m  GEN:  No distress but pale and chronically ill appearing. NECK:  No jugular venous distention at 90 degrees, waveform within normal limits, carotid upstroke brisk and symmetric, no bruits, no thyromegaly LUNGS:  Few basilar crackles and BACK:  No CVA tenderness CHEST:  Unremarkable HEART:  S1 and S2 within normal limits, no S3, no S4, no clicks, no rubs, distant heart sounds murmurs ABD:  Positive bowel sounds normal in frequency in pitch, no bruits, no rebound, no guarding, unable to assess midline mass or bruit with the patient seated. EXT:  2 plus pulses throughout, mild bilateral lower extremity edema, no cyanosis no clubbing    ASSESSMENT AND PLAN   ACUTE SYSTOLIC AND DIASTOLIC HF:  She has a newly reduced ejection fraction. He is on very high dose diuretics. I don't think she has overt pulmonary edema. She needs to have a basic metabolic profile today. Her cough could very likely be related to the Faslodex or Xgeva.  I sent a note to Dr. Burr Medico her oncologist to see if these medicines should be considered. Faslodex lists cough as a complication and 01% of patients. I'll wait to hear back from her prior to considering any other therapy.  CAD:  The patient has no new sypmtoms.  No further cardiovascular testing is indicated.  We will continue with aggressive risk reduction and meds as listed.   CKD:  As above she needs to have this followed closely and I will order labs.  Hospital records reviewed.

## 2016-03-21 NOTE — Telephone Encounter (Signed)
"  I called Dr.Feng and have not heard back yet.  Now I also need to know if I am to come in for the Faslodex and Xgeva injections this week. I saw Pulmonologist and learned the xgeva and faslodex cause cough.  For months, this side effect is killing me.  They've ordered prednisone, levaquin, doxycycline and nothing works.  I have a lot of mucous, coughing, blowing my nose and water comes out my nose when I try to drink.  Does she know these drugs side effect is cough?  My cardiologist wants me to be dry but you all encourage me to drink.  What do you think I need.  Is there a pill that can dry up these secretions?"  Will notify provider of this call and encouraged Abree to ask cardiologist volume of liquid she can consume daily that's safe for her heart and what medicine can dry up her secretions.[name]

## 2016-03-21 NOTE — Telephone Encounter (Signed)
I don't think her cough is related to her injections. I am OK to hold xgeva, but would like her to continue Faslodex. Please let her know and schedule her to see me when she comes in for injection tomorrow.   Thanks,  Truitt Merle

## 2016-03-21 NOTE — Patient Outreach (Signed)
Call placed to member to complete transition of care program and to follow up on referral/assessment for [facility].  Member state that she is still doing the "same."  She continues to have a cough, but has been referred to ENT.    She confirms that she did have an initial assessment with [facility], and has decided to stay involved with their program.  She state that she will continue to have weekly visits from their home nurse.  She is made aware that since she is now involved with [facility], her [facility] case will be closed.  She verbalizes understanding.  Will notify care management assistant and PCP of case closure.  Valente David, [place], MSN [place] [phone]

## 2016-03-21 NOTE — Telephone Encounter (Signed)
Called pt and informed her of the information below. She then asked if Dr. Juleen China can "send in something to dry up this cough". Has had the cough for a while and is tired of coughing. Please advise. Ottis Stain, CMA

## 2016-03-21 NOTE — Telephone Encounter (Signed)
Called pt on cell phone and left message on voice mail of Dr. Ernestina Penna instructions.  POF sent to scheduler for pt to see Dr. Burr Medico on Friday 7/28 after injection.

## 2016-03-22 ENCOUNTER — Ambulatory Visit (INDEPENDENT_AMBULATORY_CARE_PROVIDER_SITE_OTHER): Payer: Commercial Managed Care - HMO | Admitting: Cardiology

## 2016-03-22 ENCOUNTER — Encounter: Payer: Self-pay | Admitting: Cardiology

## 2016-03-22 VITALS — BP 98/52 | HR 78 | Ht 62.0 in | Wt 210.0 lb

## 2016-03-22 DIAGNOSIS — I5043 Acute on chronic combined systolic (congestive) and diastolic (congestive) heart failure: Secondary | ICD-10-CM

## 2016-03-22 DIAGNOSIS — Z79899 Other long term (current) drug therapy: Secondary | ICD-10-CM | POA: Diagnosis not present

## 2016-03-22 NOTE — Patient Instructions (Signed)
Medication Instructions:  NONE  Labwork: BMP   Testing/Procedures: NONE  Follow-Up: Your physician recommends that you schedule a follow-up appointment in: 1 Month   Any Other Special Instructions Will Be Listed Below (If Applicable).   If you need a refill on your cardiac medications before your next appointment, please call your pharmacy.

## 2016-03-23 ENCOUNTER — Telehealth: Payer: Self-pay | Admitting: Cardiology

## 2016-03-23 ENCOUNTER — Emergency Department (HOSPITAL_COMMUNITY): Payer: Commercial Managed Care - HMO

## 2016-03-23 ENCOUNTER — Ambulatory Visit (HOSPITAL_COMMUNITY)
Admission: RE | Admit: 2016-03-23 | Discharge: 2016-03-23 | Disposition: A | Discharge status: Home / Self Care | Payer: Commercial Managed Care - HMO | Source: Ambulatory Visit | Attending: Hematology | Admitting: Hematology

## 2016-03-23 ENCOUNTER — Ambulatory Visit (HOSPITAL_BASED_OUTPATIENT_CLINIC_OR_DEPARTMENT_OTHER): Payer: Commercial Managed Care - HMO | Admitting: Hematology

## 2016-03-23 ENCOUNTER — Emergency Department (HOSPITAL_COMMUNITY)
Admission: EM | Admit: 2016-03-23 | Discharge: 2016-03-23 | Disposition: A | Discharge status: Home / Self Care | Payer: Commercial Managed Care - HMO | Attending: Emergency Medicine | Admitting: Emergency Medicine

## 2016-03-23 ENCOUNTER — Encounter (HOSPITAL_COMMUNITY): Payer: Self-pay | Admitting: Emergency Medicine

## 2016-03-23 ENCOUNTER — Ambulatory Visit (HOSPITAL_BASED_OUTPATIENT_CLINIC_OR_DEPARTMENT_OTHER): Payer: Commercial Managed Care - HMO

## 2016-03-23 ENCOUNTER — Telehealth: Payer: Self-pay | Admitting: *Deleted

## 2016-03-23 ENCOUNTER — Telehealth: Payer: Self-pay | Admitting: Hematology

## 2016-03-23 ENCOUNTER — Encounter: Payer: Self-pay | Admitting: Hematology

## 2016-03-23 VITALS — BP 107/51 | HR 95 | Temp 97.6°F | Resp 19 | Ht 62.0 in

## 2016-03-23 DIAGNOSIS — J410 Simple chronic bronchitis: Secondary | ICD-10-CM

## 2016-03-23 DIAGNOSIS — N184 Chronic kidney disease, stage 4 (severe): Secondary | ICD-10-CM

## 2016-03-23 DIAGNOSIS — Z7984 Long term (current) use of oral hypoglycemic drugs: Secondary | ICD-10-CM | POA: Insufficient documentation

## 2016-03-23 DIAGNOSIS — C50919 Malignant neoplasm of unspecified site of unspecified female breast: Secondary | ICD-10-CM | POA: Diagnosis not present

## 2016-03-23 DIAGNOSIS — Z5111 Encounter for antineoplastic chemotherapy: Secondary | ICD-10-CM | POA: Diagnosis not present

## 2016-03-23 DIAGNOSIS — D638 Anemia in other chronic diseases classified elsewhere: Secondary | ICD-10-CM

## 2016-03-23 DIAGNOSIS — I83009 Varicose veins of unspecified lower extremity with ulcer of unspecified site: Secondary | ICD-10-CM

## 2016-03-23 DIAGNOSIS — I2510 Atherosclerotic heart disease of native coronary artery without angina pectoris: Secondary | ICD-10-CM | POA: Diagnosis not present

## 2016-03-23 DIAGNOSIS — Z87891 Personal history of nicotine dependence: Secondary | ICD-10-CM | POA: Diagnosis not present

## 2016-03-23 DIAGNOSIS — Z7951 Long term (current) use of inhaled steroids: Secondary | ICD-10-CM | POA: Insufficient documentation

## 2016-03-23 DIAGNOSIS — E1122 Type 2 diabetes mellitus with diabetic chronic kidney disease: Secondary | ICD-10-CM

## 2016-03-23 DIAGNOSIS — Z79891 Long term (current) use of opiate analgesic: Secondary | ICD-10-CM | POA: Insufficient documentation

## 2016-03-23 DIAGNOSIS — R05 Cough: Secondary | ICD-10-CM

## 2016-03-23 DIAGNOSIS — C7951 Secondary malignant neoplasm of bone: Secondary | ICD-10-CM

## 2016-03-23 DIAGNOSIS — D649 Anemia, unspecified: Secondary | ICD-10-CM | POA: Insufficient documentation

## 2016-03-23 DIAGNOSIS — Z7982 Long term (current) use of aspirin: Secondary | ICD-10-CM | POA: Insufficient documentation

## 2016-03-23 DIAGNOSIS — C7802 Secondary malignant neoplasm of left lung: Secondary | ICD-10-CM

## 2016-03-23 DIAGNOSIS — C7801 Secondary malignant neoplasm of right lung: Secondary | ICD-10-CM | POA: Diagnosis not present

## 2016-03-23 DIAGNOSIS — L97909 Non-pressure chronic ulcer of unspecified part of unspecified lower leg with unspecified severity: Secondary | ICD-10-CM

## 2016-03-23 DIAGNOSIS — R0981 Nasal congestion: Secondary | ICD-10-CM | POA: Diagnosis not present

## 2016-03-23 DIAGNOSIS — E119 Type 2 diabetes mellitus without complications: Secondary | ICD-10-CM | POA: Diagnosis not present

## 2016-03-23 DIAGNOSIS — I5032 Chronic diastolic (congestive) heart failure: Secondary | ICD-10-CM | POA: Insufficient documentation

## 2016-03-23 DIAGNOSIS — J449 Chronic obstructive pulmonary disease, unspecified: Secondary | ICD-10-CM | POA: Diagnosis not present

## 2016-03-23 DIAGNOSIS — N183 Chronic kidney disease, stage 3 (moderate): Secondary | ICD-10-CM

## 2016-03-23 DIAGNOSIS — R7989 Other specified abnormal findings of blood chemistry: Secondary | ICD-10-CM | POA: Diagnosis present

## 2016-03-23 DIAGNOSIS — I2583 Coronary atherosclerosis due to lipid rich plaque: Secondary | ICD-10-CM

## 2016-03-23 DIAGNOSIS — C50911 Malignant neoplasm of unspecified site of right female breast: Secondary | ICD-10-CM

## 2016-03-23 DIAGNOSIS — D631 Anemia in chronic kidney disease: Secondary | ICD-10-CM

## 2016-03-23 LAB — CBC WITH DIFFERENTIAL/PLATELET
BASO%: 0.8 % (ref 0.0–2.0)
Basophils Absolute: 0 10*3/uL (ref 0.0–0.1)
EOS%: 1.4 % (ref 0.0–7.0)
Eosinophils Absolute: 0 10*3/uL (ref 0.0–0.5)
HCT: 20.5 % — ABNORMAL LOW (ref 34.8–46.6)
HEMOGLOBIN: 6.5 g/dL — AB (ref 11.6–15.9)
LYMPH%: 9.1 % — AB (ref 14.0–49.7)
MCH: 27.7 pg (ref 25.1–34.0)
MCHC: 31.6 g/dL (ref 31.5–36.0)
MCV: 87.6 fL (ref 79.5–101.0)
MONO#: 0.1 10*3/uL (ref 0.1–0.9)
MONO%: 7.6 % (ref 0.0–14.0)
NEUT%: 81.1 % — ABNORMAL HIGH (ref 38.4–76.8)
NEUTROS ABS: 1.5 10*3/uL (ref 1.5–6.5)
PLATELETS: 174 10*3/uL (ref 145–400)
RBC: 2.35 10*6/uL — AB (ref 3.70–5.45)
RDW: 21.1 % — AB (ref 11.2–14.5)
WBC: 1.9 10*3/uL — AB (ref 3.9–10.3)
lymph#: 0.2 10*3/uL — ABNORMAL LOW (ref 0.9–3.3)

## 2016-03-23 LAB — COMPREHENSIVE METABOLIC PANEL
ALT: 27 U/L (ref 0–55)
ANION GAP: 13 meq/L — AB (ref 3–11)
AST: 24 U/L (ref 5–34)
Albumin: 2.7 g/dL — ABNORMAL LOW (ref 3.5–5.0)
Alkaline Phosphatase: 133 U/L (ref 40–150)
BILIRUBIN TOTAL: 0.66 mg/dL (ref 0.20–1.20)
BUN: 34.9 mg/dL — ABNORMAL HIGH (ref 7.0–26.0)
CO2: 22 meq/L (ref 22–29)
Calcium: 8.6 mg/dL (ref 8.4–10.4)
Chloride: 105 mEq/L (ref 98–109)
Creatinine: 2 mg/dL — ABNORMAL HIGH (ref 0.6–1.1)
EGFR: 25 mL/min/{1.73_m2} — AB (ref >90)
Glucose: 175 mg/dl — ABNORMAL HIGH (ref 70–140)
Potassium: 4 mEq/L (ref 3.5–5.1)
Sodium: 140 mEq/L (ref 136–145)
TOTAL PROTEIN: 6.4 g/dL (ref 6.4–8.3)

## 2016-03-23 LAB — PREPARE RBC (CROSSMATCH)

## 2016-03-23 LAB — BASIC METABOLIC PANEL
BUN: 32 mg/dL — AB (ref 7–25)
CALCIUM: 8.5 mg/dL — AB (ref 8.6–10.4)
CHLORIDE: 104 mmol/L (ref 98–110)
CO2: 25 mmol/L (ref 20–31)
CREATININE: 1.87 mg/dL — AB (ref 0.50–0.99)
Glucose, Bld: 130 mg/dL — ABNORMAL HIGH (ref 65–99)
Potassium: 3.7 mmol/L (ref 3.5–5.3)
Sodium: 141 mmol/L (ref 135–146)

## 2016-03-23 MED ORDER — FULVESTRANT 250 MG/5ML IM SOLN
500.0000 mg | INTRAMUSCULAR | Status: DC
  Administered 2016-03-23: 500 mg via INTRAMUSCULAR
  Filled 2016-03-23: qty 10

## 2016-03-23 MED ORDER — SODIUM CHLORIDE 0.9 % IV SOLN
Freq: Once | INTRAVENOUS | Status: AC
  Administered 2016-03-23: 19:00:00 via INTRAVENOUS

## 2016-03-23 MED ORDER — DENOSUMAB 120 MG/1.7ML ~~LOC~~ SOLN
120.0000 mg | Freq: Once | SUBCUTANEOUS | Status: AC
  Administered 2016-03-23: 120 mg via SUBCUTANEOUS
  Filled 2016-03-23: qty 1.7

## 2016-03-23 NOTE — ED Provider Notes (Signed)
[facility] DEPT Provider Note   CSN: [id number] Arrival date & time: 03/23/16  1706  First Provider Contact:  First MD Initiated Contact with Patient 03/23/16 1711        History   Chief Complaint Chief Complaint  Patient presents with  . Abnormal Lab    HPI Anita [name] is a 70 y.o. female.  70 yo F with a chief complaint of anemia. Patient has been seen by her oncologist for breast cancer with metastasis. She has so far refused all chemotherapy or radiation. She had labs drawn consistent with a hemoglobin of 6.5. Patient called her hematologist office he did not have any availability today. She did not want to come back tomorrow so she came to the ED. She denies any weakness chest pain shortness of breath. She has been having a persistent cough which she has had 2 rounds of antibiotics for. She thinks is still unchanged.   The history is provided by the patient.  Abnormal Lab  This is a new problem. The current episode started less than 1 hour ago. The problem occurs constantly. The problem has not changed since onset.Pertinent negatives include no chest pain, no headaches and no shortness of breath. Nothing aggravates the symptoms. Nothing relieves the symptoms. She has tried nothing for the symptoms.    Past Medical History:  Diagnosis Date  . Anemia    a. Adm 09/2012 with melena, Hgb 5.8 -> transfused. EGD/colonoscopy unrevealing.  . Anemia of chronic disease   . Breast cancer ([place])    a. Mets to bone. ER 100%/ PR 0%/Her2 neu negative.  [name] CAD (coronary artery disease)    a. 08/2012 NSTEMI: Med Rx; b. 10/2012 MV: EF 49%, old inf infarct;  c. 02/2013 NSTEMI-> Med Rx; d. 06/2013 Cath: LM nl, LAD 40p, 80-85p/m, D1 nl, LCX 20p, OM1 nl, RCA 100d w/ L->R collats, EF 30-35%, basal to mid inf AK, ant HK.  . Chronic diastolic CHF (congestive heart failure) ([place])    a. 01/2014 Echo: EF 55%, triv PR.  [name] Chronic respiratory failure ([place])    a. On O2 qhs and w/ exertion.  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 CKD (chronic kidney disease), stage IV ([place])   . COPD (chronic obstructive pulmonary disease) ([place])    a. Uses Home O2 w/ exertion.  . Dementia    very mild  . Depression   . Diabetes mellitus without complication ([place])   . Fibroid   . History of tobacco abuse    a. Quit 2014.  [name] Hyperlipidemia 02/11/2013  . Lesion of vocal cord    a. CT 05/2013 concerning for tumor.  . Morbid obesity ([place])   . Radiation 02/02/14-02/24/14   left and right femur 30 gray  . Ulcers of both lower legs [facility])     Patient Active Problem List   Diagnosis Date Noted  . Cough 03/08/2016  . Cataracts, bilateral 03/08/2016  . 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 Chronic combined systolic and diastolic congestive heart failure ([place]) 02/09/2016  . Lytic lesion of bone on x-ray 02/09/2016  . Shortness of breath 02/09/2016  . NSTEMI (non-ST elevated myocardial infarction) ([place])   . Pain in the chest   . Chest pain 02/01/2016  . Acute on chronic congestive heart failure ([place])   . Elevated troponin   . COPD exacerbation ([place]) 12/01/2015  . Erythema of lower extremity 12/01/2015  . Palliative care encounter   .  Wound cellulitis   . Severe sepsis ([place]) 05/12/2015  . Sepsis ([place]) 05/12/2015  . AKI (acute kidney injury) ([place]) 05/12/2015  . Leg wound, right 05/12/2015  . Laceration   . Absolute anemia   . CAD in native artery   . Venous stasis ulcer ([place])   . COLD (chronic obstructive lung disease) ([place])   . Leg laceration 04/04/2015  . Laceration of lower extremity 04/04/2015  . Venous stasis of lower extremity 12/05/2014  . Intertrigo 02/19/2014  . 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 Hyperlipidemia 02/11/2013  . Breast cancer metastasized to bone ([place]) 10/14/2012  . Chronic respiratory failure ([place]) 10/06/2012  . Chronic diastolic heart failure ([place]) 10/06/2012  . CAD (coronary artery disease) 10/06/2012  . CKD (chronic kidney disease), stage  IV ([place]) 10/06/2012  . Anemia of chronic disease 10/06/2012  . Breast mass, right 10/06/2012  . History of tobacco abuse 10/06/2012  . COPD (chronic obstructive pulmonary disease) ([place]) 09/27/2012  . DM2 (diabetes mellitus, type 2) ([place]) 09/26/2012    Past Surgical History:  Procedure Laterality Date  . COLONOSCOPY, ESOPHAGOGASTRODUODENOSCOPY (EGD) AND ESOPHAGEAL DILATION N/A 10/13/2012   Procedure: colonoscopy and egd;  Surgeon: Wonda Horner, MD;  Location: [facility];  Service: Endoscopy;  Laterality: N/A;  . LEFT HEART CATHETERIZATION WITH CORONARY ANGIOGRAM N/A 07/07/2013   Procedure: LEFT HEART CATHETERIZATION WITH CORONARY ANGIOGRAM;  Surgeon: Peter M Martinique, MD;  Location: [facility] CATH LAB;  Service: Cardiovascular;  Laterality: N/A;    OB History    Gravida Para Term Preterm AB Living   '2 2 2     2   '$ SAB TAB Ectopic Multiple Live Births                   Home Medications    Prior to Admission medications   Medication Sig Start Date End Date Taking? Authorizing Provider  acetaminophen (TYLENOL) 325 MG tablet Take 2 tablets (650 mg total) by mouth every 4 (four) hours as needed for headache or mild pain. 02/03/16  Yes Hillary Corinda Gubler, MD  albuterol (PROVENTIL HFA;VENTOLIN HFA) 108 (90 BASE) MCG/ACT inhaler Inhale 2 puffs into the lungs every 6 (six) hours as needed for wheezing or shortness of breath. 12/08/14  Yes Chesley Mires, MD  albuterol (PROVENTIL) (2.5 MG/3ML) 0.083% nebulizer solution Inhale 3 mLs into the lungs every 6 (six) hours as needed for shortness of breath or wheezing. 02/27/16  Yes Historical Provider, MD  Ascorbic Acid (VITAMIN C PO) Take 1 tablet by mouth daily.   Yes Historical Provider, MD  atorvastatin (LIPITOR) 40 MG tablet TAKE 1 TABLET EVERY DAY 01/04/16  Yes Nicolette Bang, DO  bag balm OINT ointment Apply 1 g topically as needed for dry skin or irritation. 12/03/14  Yes Coral Spikes, DO  clopidogrel (PLAVIX) 75 MG tablet Take 1 tablet (75 mg  total) by mouth daily. 03/08/16  Yes Nicolette Bang, DO  clotrimazole (LOTRIMIN) 1 % cream Apply 1 application topically daily. 09/12/15  Yes Minus Breeding, MD  denosumab (XGEVA) 120 MG/1.7ML SOLN injection Inject 120 mg into the skin once. Monthly at [facility]   Yes Historical Provider, MD  docusate sodium (COLACE) 100 MG capsule Take 100 mg by mouth daily.   Yes Historical Provider, MD  fluticasone (FLONASE) 50 MCG/ACT nasal spray Place 2 sprays into both nostrils daily. 03/08/16  Yes Nicolette Bang, DO  fulvestrant (FASLODEX) 250 MG/5ML injection Inject 250 mg into the muscle every 30 (thirty) days. Cancer  center   Yes Historical Provider, MD  HYDROcodone-acetaminophen (NORCO/VICODIN) 5-325 MG tablet Take 1 tablet by mouth every 6 (six) hours as needed for moderate pain. 02/20/16  Yes Truitt Merle, MD  hydrocortisone 2.5 % ointment Apply topically 2 (two) times daily. Patient taking differently: Apply 1 application topically 2 (two) times daily as needed (itching).  03/08/16  Yes Nicolette Bang, DO  HYDROmorphone (DILAUDID) 2 MG tablet Take 1 tablet (2 mg total) by mouth every 4 (four) hours as needed for severe pain. 02/03/16  Yes Hillary Corinda Gubler, MD  isosorbide mononitrate (IMDUR) 60 MG 24 hr tablet Take 1 tablet (60 mg total) by mouth daily. 12/01/15  Yes Nicolette Bang, DO  metoprolol tartrate (LOPRESSOR) 25 MG tablet Take 1 tablet (25 mg total) by mouth 2 (two) times daily. 03/08/16  Yes Nicolette Bang, DO  mirtazapine (REMERON) 15 MG tablet Take 1 tablet (15 mg total) by mouth at bedtime. 12/01/15  Yes Nicolette Bang, DO  nitroGLYCERIN (NITROSTAT) 0.4 MG SL tablet Place 1 tablet (0.4 mg total) under the tongue every 5 (five) minutes as needed for chest pain. 03/08/16  Yes Nicolette Bang, DO  NON FORMULARY Place 3 L into the nose as needed (oxygen).    Yes Historical Provider, MD  omeprazole (PRILOSEC) 40 MG capsule TAKE 1 CAPSULE EVERY  DAY Patient taking differently: TAKE '40mg'$  by mouth EVERY DAY 03/22/16  Yes Nicolette Bang, DO  potassium chloride SA (K-DUR,KLOR-CON) 20 MEQ tablet Take 2 tablets (40 mEq total) by mouth 2 (two) times daily. 12/01/15  Yes Nicolette Bang, DO  torsemide (DEMADEX) 20 MG tablet Take 80 mg by mouth 2 (two) times daily.   Yes Historical Provider, MD  VITAMIN D, CHOLECALCIFEROL, PO Take 1,000 mg by mouth daily.    Yes Historical Provider, MD  aspirin EC 81 MG EC tablet Take 1 tablet (81 mg total) by mouth daily. 10/02/12   Leone [place], MD  palbociclib [facility]) 100 MG capsule Take 1 capsule (100 mg total) by mouth daily with breakfast. Take whole with food. Patient not taking: Reported on 03/23/2016 10/21/15   Truitt Merle, MD    Family History Family History  Problem Relation Age of Onset  . Cancer Mother     leukemia  . Hypertension Mother   . Diabetes Father   . Heart disease Father     passed away due to heart attack    Social History Social History  Substance Use Topics  . Smoking status: Former Smoker    Packs/day: 2.00    Years: 50.00    Types: Cigarettes    Start date: 08/27/1961    Quit date: 09/24/2012  . Smokeless tobacco: Never Used     Comment: Quit when COPD dx  . Alcohol use No     Allergies   Omnipaque [iohexol] and Benzene   Review of Systems Review of Systems  Constitutional: Negative for chills and fever.  HENT: Positive for congestion. Negative for rhinorrhea.   Eyes: Negative for redness and visual disturbance.  Respiratory: Positive for cough. Negative for shortness of breath and wheezing.   Cardiovascular: Negative for chest pain and palpitations.  Gastrointestinal: Negative for nausea and vomiting.  Genitourinary: Negative for dysuria and urgency.  Musculoskeletal: Negative for arthralgias and myalgias.  Skin: Negative for pallor and wound.  Neurological: Negative for dizziness and headaches.     Physical Exam Updated Vital  Signs BP (!) 114/48   Pulse 91  Temp 98.2 F (36.8 C) (Oral)   Resp (!) 29   SpO2 (!) 89%   Physical Exam  Constitutional: She is oriented to person, place, and time. She appears well-developed and well-nourished. No distress.  HENT:  Head: Normocephalic and atraumatic.  Eyes: EOM are normal. Pupils are equal, round, and reactive to light.  Neck: Normal range of motion. Neck supple.  Cardiovascular: Normal rate and regular rhythm.  Exam reveals no gallop and no friction rub.   No murmur heard. Pulmonary/Chest: Effort normal. She has no wheezes. She has no rales.  Right-sided rhonchi  Abdominal: Soft. She exhibits no distension. There is no tenderness. There is no guarding.  Musculoskeletal: She exhibits no edema or tenderness.  Neurological: She is alert and oriented to person, place, and time.  Skin: Skin is warm and dry. She is not diaphoretic.  Psychiatric: She has a normal mood and affect. Her behavior is normal.  Nursing note and vitals reviewed.    ED Treatments / Results  Labs (all labs ordered are listed, but only abnormal results are displayed) Labs Reviewed  TYPE AND SCREEN  PREPARE RBC (CROSSMATCH)    EKG  EKG Interpretation None       Radiology No results found.  Procedures Procedures (including critical care time)  Medications Ordered in ED Medications  0.9 %  sodium chloride infusion ( Intravenous Stopped 03/23/16 2222)     Initial Impression / Assessment and Plan / ED Course  I have reviewed the triage vital signs and the nursing notes.  Pertinent labs & imaging results that were available during my care of the patient were reviewed by me and considered in my medical decision making (see chart for details).  Clinical Course    70 yo F With a chief complaint of anemia. She denies any symptoms. When I discussed admission with the patient she refused and said she just wanted the transfusion while in the ED. I discussed the limitations of the  emergency department. We will transfuse the patient. She refused a chest x-ray.  Reassessed with Desats into the 70's on home O2, increased flow with improvement.  Declining other therapy then transfusion.  CRITICAL CARE Performed by: Rae Roam   Total critical care time: 76 minutes  Critical care time was exclusive of separately billable procedures and treating other patients.  Critical care was necessary to treat or prevent imminent or life-threatening deterioration.  Critical care was time spent personally by me on the following activities: development of treatment plan with patient and/or surrogate as well as nursing, discussions with consultants, evaluation of patient's response to treatment, examination of patient, obtaining history from patient or surrogate, ordering and performing treatments and interventions, ordering and review of laboratory studies, ordering and review of radiographic studies, pulse oximetry and re-evaluation of patient's condition.  11:22 PM:  I have discussed the diagnosis/risks/treatment options with the patient and believe the pt to be eligible for discharge home to follow-up with PCP. We also discussed returning to the ED immediately if new or worsening sx occur. We discussed the sx which are most concerning (e.g., sudden worsening pain, fever, inability to tolerate by mouth) that necessitate immediate return. Medications administered to the patient during their visit and any new prescriptions provided to the patient are listed below.  Medications given during this visit Medications  0.9 %  sodium chloride infusion ( Intravenous Stopped 03/23/16 2222)     The patient appears reasonably screen and/or stabilized for discharge and I doubt any  other medical condition or other [facility] requiring further screening, evaluation, or treatment in the ED at this time prior to discharge.   Final Clinical Impressions(s) / ED Diagnoses   Final diagnoses:  Anemia,  unspecified anemia type  Symptomatic anemia    New Prescriptions Discharge Medication List as of 03/23/2016 10:11 PM       Deno Etienne, DO 03/23/16 2322

## 2016-03-23 NOTE — Telephone Encounter (Signed)
Line rings w/ no answer or VM pickup.  Reviewed chart notes. Per Dr. Rosezella [place] notes, when patient seen yesterday, Dr. Percival Spanish concerned for her cough being SE of oncology meds. Note was sent to Dr. Burr Medico to review these meds.

## 2016-03-23 NOTE — Telephone Encounter (Signed)
per pof for Ct scan to be sch-[facility] will call pt to sch

## 2016-03-23 NOTE — ED Notes (Signed)
MD at bedside. Dr. Tyrone Nine at bedside.

## 2016-03-23 NOTE — Telephone Encounter (Signed)
Pt called and left message requesting a call back from Dr. Burr Medico about whether pt should come in for injections today.  Attempted to call pt back and phone number listed  Unsuccessfully -  Phone just rang for 5 minutes without voice mail.  Called pt on cell phone and left message on voice mail instructing pt to come in for lab, injection, and office visit today. Attempted to call pt at home number left for nurse - again, no answer, and no voice mail.

## 2016-03-23 NOTE — Progress Notes (Signed)
Patient left message for dr/nurse call back about her meds-per her heart dr? It was hard to understand. I sent mess to dr. Blair Hailey to call [phone]

## 2016-03-23 NOTE — Progress Notes (Signed)
[name], DO [place] [address]  CHIEF COMPLAIN: follow up metastatic breast cancer     Breast cancer metastasized to bone ([place])   10/08/2012 Imaging    CT chest, abdomen and pelvis showed extensive sclerotic lesions throughout the bones, 4.9 x 3.4 cm soft tissue versus hematoma anterior to the spleen. 6 mm groundglass nodule in the right upper lobe of lung.     10/10/2012 Initial Biopsy    Right breast core needle biopsy showed invasive lobular carcinoma, grade 2. Left iliac crest bone biopsy showed metastatic carcinoma, morphology similar to breast biopsy.     10/14/2012 Initial Diagnosis    Breast cancer metastasized to bone [facility])     11/2012 - 07/17/2015 Anti-estrogen oral therapy    Letrozole '1mg'$  daily, add palbociclib on 01/01/2014, dose modified to 2 weeks on and 1 week off with cycle 3, stopped due to disease progression     02/02/2014 - 02/24/2014 Radiation Therapy    Palliative radiation to left and the right femur, 30 gray in 10 fractions, by Dr. Sondra Come      09/01/2014 Imaging    CT chest abdomen and pelvis showed several small pulmonary nodules are not changed. Stable widespread bone metastasis.     10/10/2014 Receptors her2    ER 100% positive, PR negative, HER-2 negative.     02/08/2015 Imaging    PET scan showed a stable 8 mm right level III lymph node with SUV 8.8, no new neck adenopathy, stable diffuse osseous metastasis, stable for many nodules.     07/04/2015 Progression     PET scan showed overal dasease progression in the bones , slightly increased hypermetabolic activ of the lung nodules , similar appearance of hypermetaabolic rght level III lymph node.     07/18/2015 -  Anti-estrogen oral therapy     fulvestrant 500 mg every 4 weeks ( loading dose every  weeksX2),  and Ibrance '100mg'$  daily,  3 week on, 1 week off     11/04/2015 Imaging    PET scan showed interval response to therapy,  especially bone metastasis. Right level III cervical node and right upper lobe pulmonary nodule also demonstrate decreased hypermetabolism.       CURRENT THERAPY:   1. She started letrozole in 11/2012.  Added Palbociclib 125 mg daily for 21 days on and 7 days off on 01/01/2014 with disease progression. Dose modified to 2 weeks on 1 week off with cycle 3.  Letrozole changed to fulvestrant on 07/18/2015 due to disease progress, and Ibrance changed to '100mg'$  daily, 3 weeks on and one week off.  2. zometa added on 01/01/2014 for metastatic bone disease. Zometa held due to renal insufficiency and changed to [facility] because of skeletal disease progression and safe renal profile.   INTERVAL HISTORY:  Anita [name] 70 y.o. female with a history of right breast cancer with metastases to the bone/lungs here for follow-up.  She complains of Persistent cough, with clear mucus production, it has been going on for 2-3 months. She also complains of cough and choking with water, sometime in water comes out of her nose when she drinks. She has seen pulmonologist Dr. Halford Chessman, had multiple rounds of antibiotics and steroids without significant improvement. She also saw a cardiologist Dr. Percival Spanish who did not think she cough is related to CHF. She has slightly more fatigued lately, with slightly worse of dyspnea. No chest pain or other  new complaints.   MEDICAL HISTORY: Past Medical History:  Diagnosis Date  . Anemia    a. Adm 09/2012 with melena, Hgb 5.8 -> transfused. EGD/colonoscopy unrevealing.  . Anemia of chronic disease   . Breast cancer (HCC)    a. Mets to bone. ER 100%/ PR 0%/Her2 neu negative.  [name] CAD (coronary artery disease)    a. 08/2012 NSTEMI: Med Rx; b. 10/2012 MV: EF 49%, old inf infarct;  c. 02/2013 NSTEMI-> Med Rx; d. 06/2013 Cath: LM nl, LAD 40p, 80-85p/m, D1 nl, LCX 20p, OM1 nl, RCA 100d w/ L->R collats, EF 30-35%, basal to mid inf AK, ant HK.  . Chronic diastolic CHF (congestive heart failure) (HCC)      a. 01/2014 Echo: EF 55%, triv PR.  [name] Chronic respiratory failure (HCC)    a. On O2 qhs and w/ exertion.  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 CKD (chronic kidney disease), stage IV (HCC)   . COPD (chronic obstructive pulmonary disease) (HCC)    a. Uses Home O2 w/ exertion.  . Dementia    very mild  . Depression   . Diabetes mellitus without complication (HCC)   . Fibroid   . History of tobacco abuse    a. Quit 2014.  [name] Hyperlipidemia 02/11/2013  . Lesion of vocal cord    a. CT 05/2013 concerning for tumor.  . Morbid obesity (HCC)   . Radiation 02/02/14-02/24/14   left and right femur 30 gray  . Ulcers of both lower legs (HCC)     ALLERGIES:  is allergic to omnipaque [iohexol] and benzene.  MEDICATIONS:    Medication List       Accurate as of 03/23/16  8:23 AM. Always use your most recent med list.          ACCU-CHEK AVIVA PLUS test strip Generic drug:  glucose blood   acetaminophen 325 MG tablet Commonly known as:  TYLENOL Take 2 tablets (650 mg total) by mouth every 4 (four) hours as needed for headache or mild pain.   albuterol 108 (90 Base) MCG/ACT inhaler Commonly known as:  PROVENTIL HFA;VENTOLIN HFA Inhale 2 puffs into the lungs every 6 (six) hours as needed for wheezing or shortness of breath.   aspirin 81 MG EC tablet Take 1 tablet (81 mg total) by mouth daily.   atorvastatin 40 MG tablet Commonly known as:  LIPITOR TAKE 1 TABLET EVERY DAY   bag balm Oint ointment Apply 1 g topically as needed for dry skin or irritation.   clopidogrel 75 MG tablet Commonly known as:  PLAVIX Take 1 tablet (75 mg total) by mouth daily.   clotrimazole 1 % cream Commonly known as:  LOTRIMIN Apply 1 application topically daily.   docusate sodium 100 MG capsule Commonly known as:  COLACE Take 100 mg by mouth daily.   FASLODEX 250 MG/5ML injection Generic drug:  fulvestrant Inject 250 mg into the muscle every 30 (thirty) days.  Cancer center   fluticasone 50 MCG/ACT nasal spray Commonly known as:  FLONASE Place 2 sprays into both nostrils daily.   HYDROcodone-acetaminophen 5-325 MG tablet Commonly known as:  NORCO/VICODIN Take 1 tablet by mouth every 6 (six) hours as needed for moderate pain.   hydrocortisone 2.5 % ointment Apply topically 2 (two) times daily.   HYDROmorphone 2 MG tablet Commonly known as:  DILAUDID Take 1 tablet (2 mg total) by mouth every 4 (four) hours as needed  for severe pain.   isosorbide mononitrate 60 MG 24 hr tablet Commonly known as:  IMDUR Take 1 tablet (60 mg total) by mouth daily.   metoprolol tartrate 25 MG tablet Commonly known as:  LOPRESSOR Take 1 tablet (25 mg total) by mouth 2 (two) times daily.   mirtazapine 15 MG tablet Commonly known as:  REMERON Take 1 tablet (15 mg total) by mouth at bedtime.   nitroGLYCERIN 0.4 MG SL tablet Commonly known as:  NITROSTAT Place 1 tablet (0.4 mg total) under the tongue every 5 (five) minutes as needed for chest pain.   NON FORMULARY Place 3 L into the nose as needed (oxygen).   omeprazole 40 MG capsule Commonly known as:  PRILOSEC TAKE 1 CAPSULE EVERY DAY   palbociclib 100 MG capsule Commonly known as:  IBRANCE Take 1 capsule (100 mg total) by mouth daily with breakfast. Take whole with food.   potassium chloride SA 20 MEQ tablet Commonly known as:  K-DUR,KLOR-CON Take 2 tablets (40 mEq total) by mouth 2 (two) times daily.   torsemide 20 MG tablet Commonly known as:  DEMADEX Take 80 mg by mouth 2 (two) times daily.   VITAMIN D (CHOLECALCIFEROL) PO Take 1,000 mg by mouth daily.   XGEVA 120 MG/1.7ML Soln injection Generic drug:  denosumab Inject 120 mg into the skin once. Monthly at [place]:  Past Surgical History:  Procedure Laterality Date  . COLONOSCOPY, ESOPHAGOGASTRODUODENOSCOPY (EGD) AND ESOPHAGEAL DILATION N/A 10/13/2012   Procedure: colonoscopy and egd;  Surgeon: Wonda Horner,  MD;  Location: [place];  Service: Endoscopy;  Laterality: N/A;  . LEFT HEART CATHETERIZATION WITH CORONARY ANGIOGRAM N/A 07/07/2013   Procedure: LEFT HEART CATHETERIZATION WITH CORONARY ANGIOGRAM;  Surgeon: Peter M Martinique, MD;  Location: [facility] CATH LAB;  Service: Cardiovascular;  Laterality: N/A;   REVIEW OF SYSTEMS:   Constitutional: Denies fevers, chills or abnormal weight loss Eyes: Denies blurriness of vision Ears, nose, mouth, throat, and face: Denies mucositis or sore throat Respiratory: Denies cough, (+) stable dyspnea, no wheezes Cardiovascular: Denies palpitation, chest discomfort or lower extremity swelling Gastrointestinal:  Denies nausea, heartburn or change in bowel habits Skin: Denies abnormal skin rashes Lymphatics: Denies new lymphadenopathy or easy bruising Neurological:Denies numbness, tingling or new weaknesses Behavioral/Psych: Mood is stable, no new changes  All other systems were reviewed with the patient and are negative.  PHYSICAL EXAMINATION: ECOG PERFORMANCE STATUS: 3 Blood pressure (!) 107/51, pulse 95, temperature 97.6 F (36.4 C), temperature source Oral, resp. rate 19, height '5\' 2"'$  (1.575 m), SpO2 99 %. GENERAL:alert, no distress and comfortable morbidly obese woman in wheelchair, chronically ill appearing SKIN: skin color, texture, turgor are normal; multiple skin scratches on her legs  EYES: normal, Conjunctiva are pink and non-injected, sclera clear OROPHARYNX:no exudate, no erythema and lips, buccal mucosa, and tongue normal  NECK: supple, thyroid normal size, non-tender, without nodularity LYMPH:  no palpable lymphadenopathy in the cervical, axillary LUNGS: coarse breathing with slightly increased breathing rate, (+) crackers on bilateral bases HEART: regular rate & rhythm and no murmurs and 2+ lower extremity edema BREAST: deferred ABDOMEN:abdomen soft, non-tender and normal bowel sounds; obese Musculoskeletal:no cyanosis of digits and no clubbing;  right lower extremities are wrapped with gauze up to knees. Left leg exam showed pitting edema to knee, mild diffuse skin erythema above the ankle, scratch markers, no open wound. Skin temperature is normal. NEURO: alert & oriented x 3 with fluent speech, no focal  motor/sensory deficits  LABORATORY DATA: CBC Latest Ref Rng & Units 03/23/2016 02/20/2016 02/03/2016  WBC 3.9 - 10.3 10e3/uL 1.9(L) 4.8 7.6  Hemoglobin 11.6 - 15.9 g/dL 6.5(LL) 9.9(L) 9.0(L)  Hematocrit 34.8 - 46.6 % 20.5(L) 30.8(L) 28.3(L)  Platelets 145 - 400 10e3/uL 174 262 282    CMP Latest Ref Rng & Units 03/23/2016 03/22/2016 02/20/2016  Glucose 70 - 140 mg/dl 175(H) 130(H) 279(H)  BUN 7.0 - 26.0 mg/dL 34.9(H) 32(H) 38.0(H)  Creatinine 0.6 - 1.1 mg/dL 2.0(H) 1.87(H) 2.0(H)  Sodium 136 - 145 mEq/L 140 141 138  Potassium 3.5 - 5.1 mEq/L 4.0 3.7 3.8  Chloride 98 - 110 mmol/L - 104 -  CO2 22 - 29 mEq/L '22 25 28  '$ Calcium 8.4 - 10.4 mg/dL 8.6 8.5(L) 9.5  Total Protein 6.4 - 8.3 g/dL 6.4 - 6.3(L)  Total Bilirubin 0.20 - 1.20 mg/dL 0.66 - 0.49  Alkaline Phos 40 - 150 U/L 133 - 147  AST 5 - 34 U/L 24 - 34  ALT 0 - 55 U/L 27 - 65(H)   CA 27.29  Status: Finalresult Visible to patient:  Not Released Nextappt: 03/07/2016 at 03:00 PM in Family Medicine [name], DO) Dx:  Anemia of chronic disease; Breast can...              Ref Range 65moago (12/30/15) 269mogo (12/23/15) 75m31moo (11/03/15)    CA 27.29 0.0 - 38.6 U/mL 98.5 (H) 92.2 (H)CM 71.9 (H)CM         PATHOLOGY REPORT  Diagnosis 10/10/2014 1. Bone, biopsy, left iliac crest - METASTATIC MAMMARY CARCINOMA, SEE COMMENT. 2. Breast, right, needle core biopsy - INVASIVE MAMMARY CARCINOMA, SEE COMMENT. Microscopic Comment 1. and 2. The breast biopsy demonstrates definitive morphologic features of invasive mammary carcinoma. Although the grade of tumor is best assessed at resection, with these biopsies, the carcinoma is grade II. An E-Cadherin stain  is pending and will be reported as an addendum. The morphology of the metastatic carcinoma in part 2 is identical to the breast biopsy. Breast prognostic studies are pending (part 2) and will be reported as an addendum. The case was reviewed with Dr. HilAvis Epleyo concurs. The case was discussed with Dr. Ha Lamonte Sakai 10/13/2012.  RADIOGRAPHIC STUDIES:  PET 02/27/2016 IMPRESSION: 1. The lesion of concern in the proximal left tibial diaphysis demonstrates only low-level hypermetabolism which is only slightly above the metabolic activity in the contralateral tibial diaphysis. This is only one of many lytic lesions noted throughout the visualized axial and appendicular skeleton, which appear to have evolved slightly over several prior examinations, most of which demonstrate little to no increased metabolic activity. While it is possible that these lesions could represent treated osseous metastasis from the patient's breast cancer, breast cancer metastasis tend to be come sclerotic over time and should not progressively increase in size without demonstrating a corresponding hypermetabolism. This raises concern for potential concurrent primary malignancy such as multiple myeloma. Clinical correlation is recommended. 2. Hypermetabolic right-sided level III cervical lymph node is very similar to the prior examination, as above. 3. No other new signs of extra skeletal metastatic disease are identified in the neck, chest, abdomen or pelvis. 4. Multiple left-sided calculi including a large staghorn calculus measuring 3 cm in the left renal pelvis. No ureteral stones or findings of urinary tract obstruction are noted at this time. 5. Hepatic steatosis. 6. Aortic atherosclerosis, in addition to three-vessel coronary artery disease. Please note that although the presence of coronary artery  calcium documents the presence of coronary artery disease, the severity of this disease and any potential stenosis  cannot be assessed on this non-gated CT examination. Assessment for potential risk factor modification, dietary therapy or pharmacologic therapy may be warranted, if clinically indicated. 7. Additional incidental findings, as above. These results were called by telephone at the time of interpretation on 02/27/2016 at 4:05 pm to Dr. Truitt Merle, who verbally acknowledged these results.    ASSESSMENT: 70 year old Caucasian female  PLAN:  1.  Metastatic breast cancer (ER pos/PR neg/ HER2 neg) with metastasis to bones and lungs, possible right cervical nodes.  -I reviewed her restaging PET scan from 02/27/2016 with pt in detailss, which showed minimum hypermetabolism in her diffuse bone metastasis, although some of the lytic bone lesions have increased in size. I have spoken with the radiologist Dr. Weber Cooks who is concerned about multiple myeloma due to the diffuse non-hypermetabolic lytic bone lesions. However she had previous bone biopsy which confirmed metastatic breast cancer. -She has no significant bone pain, giving the PET scan results, I think she is still responding to antiestrogen therapy. I'll continue fulvestrant -dur to the persistent cough, with unknown etiology, possibly related to medication, I will stop her Ibrance  -I'll obtain a repeated CT chest without contrast in 2 weeks for reevaluation. If she has cancer progression in her lungs, I'll change her treatment. - lab reviewed with her, she has worsening anemia, needs blood transfusion.  2. Productive cough  -She has been treated for 2 courses of oral antibiotics, no significant improvement, symptoms has been persistent for 2-3 months now. -I have communicated with her pulmonologist Dr. Halford Chessman who does not think it's COPD, possible postnasal drip versus medication related or cough. I also discussed with her cardiologist Dr. Percival Spanish who does not think it's related to CHF -She also has some choking and coughing with drinking water,  she is scheduled to see ENT and will have a swallowing evaluation -will stop her Leslee Home and Xgeva to see if her cough improves   3. bone metastasis -she has been on Xgeva for one and half years, I will stop temporary due to her persistent cough. -Continue calcium and vitamin D  4. Anemia of chronic disease (CKD and malignancy), and anemia second to Ibrance -- Past work up for reversible causes of anemia were negative.  -Repeated ferritin 185, serum iron 44 (normal) and saturation 16% on 09/15/2015  -She is not taking iron pill, could not tolerate.  -I do not recommend ESA, due to her underline malignancy  -anemia improved after blood transfusion on 09/17/2015, but not much after 1U RBC in 11/2015 -She has worsening anemia again, hemoglobin 6.5, symptomatic, patient came in late afternoon, and prefers to get a blood transfusion tonight at ED   5. CHF -she will follow up with Dr. Percival Spanish .  6. Diabetes mellitus, type II.  -not on medication, diet controlled most of time  7. COPD: On albuterol prn. She remains on home oxygen.  8. Chronic renal insufficiency, stage IV   -Stable. -she will follow-up with Dr. Florene Glen  9: Bilateral leg venous stasis ulcers -She was recently discharged from wound clinic  -follow up with PCP   10. Mild leukopenia and thrombocytopenia -Secondary to Leslee Home [facility] continue monitoring.  All questions were answered. The patient knows to call the clinic with any problems, questions or concerns. We can certainly see the patient much sooner if necessary.  Plan -Due to her symptom medical anemia with hemoglobin 6.5, she'll  proceeded to [facility] emergency room for blood transfusion tonight -repeat CT chest wo contrast in 2 weeks and I will see her afterwards -stop Leslee Home and Xgeva, continue fulvestrant injection for now. -I communicated with Drs. Sood and Hochrein today regarding her persistent cough   I spent 30 minutes counseling the patient face to  face. The total time spent in the appointment was 40 minutes.   Truitt Merle  03/23/2016

## 2016-03-23 NOTE — ED Triage Notes (Signed)
Pt sent from [facility]. She has a hgb of 6.5 today. She is short of breath. She was sent to ED for transfusion. She has no pain. She is alert, no acute distress. Skin warm, dry.

## 2016-03-23 NOTE — ED Notes (Signed)
Bed: WA06 Expected date:  Expected time:  Means of arrival:  Comments: Hold for cancer patient

## 2016-03-23 NOTE — Patient Instructions (Signed)

## 2016-03-23 NOTE — Telephone Encounter (Signed)
New message     Pt verbalized she is calling for the rn   She wants to know what Dr.Hochrein has told her other doctor about her care, the line disconnected when I attempted to get more information

## 2016-03-24 ENCOUNTER — Encounter: Payer: Self-pay | Admitting: Hematology

## 2016-03-25 LAB — TYPE AND SCREEN
ABO/RH(D): A POS
Antibody Screen: NEGATIVE
UNIT DIVISION: 0

## 2016-03-26 LAB — KAPPA/LAMBDA LIGHT CHAINS
IG KAPPA FREE LIGHT CHAIN: 39.1 mg/L — AB (ref 3.3–19.4)
IG LAMBDA FREE LIGHT CHAIN: 25.9 mg/L (ref 5.7–26.3)
KAPPA/LAMBDA FLC RATIO: 1.51 (ref 0.26–1.65)

## 2016-03-26 NOTE — Telephone Encounter (Signed)
Line rings w/o answer. No way to leave VM msg.

## 2016-03-27 ENCOUNTER — Telehealth: Payer: Self-pay | Admitting: *Deleted

## 2016-03-27 ENCOUNTER — Telehealth: Payer: Self-pay | Admitting: Internal Medicine

## 2016-03-27 LAB — PROTEIN ELECTROPHORESIS, SERUM, WITH REFLEX
A/G Ratio: 1 (ref 0.7–1.7)
ALBUMIN: 2.9 g/dL (ref 2.9–4.4)
ALPHA 1: 0.3 g/dL (ref 0.0–0.4)
ALPHA 2: 1.1 g/dL — AB (ref 0.4–1.0)
Beta: 0.9 g/dL (ref 0.7–1.3)
Gamma Globulin: 0.5 g/dL (ref 0.4–1.8)
Globulin, Total: 2.8 g/dL (ref 2.2–3.9)
Total Protein: 5.7 g/dL — ABNORMAL LOW (ref 6.0–8.5)

## 2016-03-27 NOTE — Telephone Encounter (Signed)
Pt is calling because she has had a cough for some time and nothing is helping. She would like the doctor to call in something for that. jw

## 2016-03-27 NOTE — Telephone Encounter (Signed)
Made an appt for next Thurs on same day clinic at 4:15. If this is not ok please let me know and I will contact pt. Please let me know. Ottis Stain, CMA

## 2016-03-27 NOTE — Telephone Encounter (Signed)
Thank you for scheduling her. The same day appointment will work fine.   Phill Myron, D.O. 03/27/2016, 7:33 PM PGY-2, [place]

## 2016-03-27 NOTE — Telephone Encounter (Signed)
"   I need to speak with Anita [name].  I know I come in August 11th.  I need to know what time." Appointment information provided.  Second request for Anita [name]. Call transferred ext [phone].

## 2016-03-28 ENCOUNTER — Telehealth: Payer: Self-pay | Admitting: Hematology

## 2016-03-28 ENCOUNTER — Telehealth: Payer: Self-pay | Admitting: *Deleted

## 2016-03-28 ENCOUNTER — Telehealth: Payer: Self-pay | Admitting: Cardiology

## 2016-03-28 NOTE — Telephone Encounter (Signed)
See previous phone notes. Patient scheduled to meet with Dr. Juleen China 04/05/2016. Ottis Stain, CMA

## 2016-03-28 NOTE — Telephone Encounter (Signed)
pt cld to get appt time-may want to r/s because she wants scan earlier-gave central scheduling # and adv to get that sch first then I can move labs prior to tht scan

## 2016-03-28 NOTE — Telephone Encounter (Signed)
Pt wants to know if the doctor would send her to a speech pathologist for a swallow test. Delray Alt, CMA

## 2016-03-28 NOTE — Telephone Encounter (Signed)
Unable to reach pt or leave a message  

## 2016-03-28 NOTE — Telephone Encounter (Signed)
Line busy

## 2016-03-28 NOTE — Telephone Encounter (Signed)
New message       The pt is having cataracts surgery, but the pt declined the surgery    She wants the approval from DR. Hochrein    Request for surgical clearance:  What type of surgery is being performed? cataracts surgery When is this surgery scheduled? pending 1. Are there any medications that need to be held prior to surgery and how long?pending  2. Name of physician performing surgery? Not provided  3. What is your office phone and fax number? Not provided

## 2016-03-31 NOTE — Telephone Encounter (Signed)
I have already ordered speech therapy referral for this patient. We can discuss further at her upcoming appointment.   Phill Myron, D.O. 03/31/2016, 9:03 PM PGY-2, [place]

## 2016-04-02 ENCOUNTER — Telehealth: Payer: Self-pay | Admitting: Hematology

## 2016-04-02 ENCOUNTER — Telehealth: Payer: Self-pay | Admitting: Internal Medicine

## 2016-04-02 NOTE — Telephone Encounter (Signed)
Pt would like Dr. Juleen China to fax a letter to [facility] [phone], stating the pt "needs cenertal air in order to breathe".   Pt also stated she is "broke out and itchy". Please advise. Thanks! ep

## 2016-04-02 NOTE — Telephone Encounter (Signed)
pt cld to r/s appt-gave r/s time & date for 8/11

## 2016-04-04 ENCOUNTER — Telehealth: Payer: Self-pay | Admitting: Hematology

## 2016-04-04 ENCOUNTER — Other Ambulatory Visit: Payer: Self-pay | Admitting: Internal Medicine

## 2016-04-04 NOTE — Telephone Encounter (Signed)
Informed pt of below and she also asked about a letter being faxed to see about getting her air fixed to help her breathing, did see previous note about this, told pt if she did not hear from [place] today to be sure to ask her PCP tomorrow at her visit. Forwarding to PCP.  Katharina Caper, Morio Widen D, [place]

## 2016-04-04 NOTE — Telephone Encounter (Signed)
Pt called again and would like to know when we will be able to write and fax the letter to [facility]. Please advise. Thanks! ep

## 2016-04-04 NOTE — Telephone Encounter (Signed)
pt cld to sch appt-gave pt r/s appt time & date for 8/14@8 /18

## 2016-04-05 ENCOUNTER — Ambulatory Visit (INDEPENDENT_AMBULATORY_CARE_PROVIDER_SITE_OTHER): Payer: Commercial Managed Care - HMO | Admitting: Internal Medicine

## 2016-04-05 ENCOUNTER — Encounter: Payer: Self-pay | Admitting: Internal Medicine

## 2016-04-05 VITALS — BP 99/57 | Temp 97.5°F | Wt 220.0 lb

## 2016-04-05 DIAGNOSIS — R059 Cough, unspecified: Secondary | ICD-10-CM

## 2016-04-05 DIAGNOSIS — N183 Chronic kidney disease, stage 3 (moderate): Secondary | ICD-10-CM

## 2016-04-05 DIAGNOSIS — I950 Idiopathic hypotension: Secondary | ICD-10-CM

## 2016-04-05 DIAGNOSIS — R1310 Dysphagia, unspecified: Secondary | ICD-10-CM | POA: Diagnosis not present

## 2016-04-05 DIAGNOSIS — L280 Lichen simplex chronicus: Secondary | ICD-10-CM | POA: Diagnosis not present

## 2016-04-05 DIAGNOSIS — E1122 Type 2 diabetes mellitus with diabetic chronic kidney disease: Secondary | ICD-10-CM | POA: Diagnosis not present

## 2016-04-05 DIAGNOSIS — R05 Cough: Secondary | ICD-10-CM

## 2016-04-05 MED ORDER — OMEPRAZOLE 40 MG PO CPDR: 40.0000 mg | DELAYED_RELEASE_CAPSULE | Freq: Every day | ORAL | 3 refills | Status: AC

## 2016-04-05 MED ORDER — CETIRIZINE HCL 10 MG PO TABS: 10.0000 mg | ORAL_TABLET | Freq: Every day | ORAL | Status: DC

## 2016-04-05 MED ORDER — HYDROCORTISONE 2.5 % EX OINT: TOPICAL_OINTMENT | Freq: Two times a day (BID) | CUTANEOUS | 3 refills | Status: AC

## 2016-04-05 MED ORDER — CETIRIZINE HCL 10 MG PO CAPS: 1.0000 | ORAL_CAPSULE | Freq: Every day | ORAL | 5 refills | Status: AC

## 2016-04-05 NOTE — Telephone Encounter (Signed)
We will discuss this letter at her scheduled appointment today.   Phill Myron, D.O. 04/05/2016, 8:26 AM PGY-2, [place]

## 2016-04-05 NOTE — Patient Instructions (Signed)
Today we talked about:  Decrease your Torsemide to 80 mg in the morning and 40 mg at night. If you are still light headed you need to be seen again because your blood pressure may be low.   Rash: Apply Hydrocortisone ointment all over twice per day until you are very greasy. I have ordered a bunch of this for you. Keep your skin very moist and clip your finger nails short,. Rub, don't scratch the area.   Cough: I have sent in the Omeprazole. Please take this daily. You should be contacted about a swallow study because I have placed this order. Contact the pulmonologist regarding a letter for [facility].   Blood Sugars: Overall your blood sugar control is good. Numbers in the upper 100s-lower 200s are ok. It is too dangerous to start Metformin with your kidney problems.

## 2016-04-05 NOTE — Progress Notes (Signed)
Subjective:    Anita [name] - 70 y.o. female MRN [id number]  Date of birth: Oct 31, 1945  HPI  Anita [name] is here for follow up of chronic medical conditions.  Cough: Has been persistent and has not improved after treatment several times for COPD and CHF exacerbations. Recently seen by oncology who stopped Leslee Home and Xgeva to see if there will be an improvement in cough. Patient is not taking PPI that was prescribed. Has associated dysphagia with swallowing of liquids. Evaluated recently by ENT. Patient reports ENT said she needed swallow evaluation. Has had HH speech therapy services recently.   T2DM: Has been diet controlled for several years. CBGs checked at home several times per week by home health. Patient reports that most recent non-fasting CBG was 190. Typical numbers are in the upper 100s to low 200s.    Neurodermatitis: Continues to be bothersome to patient. Reports extreme pruritis. Has been using Hydrocortisone and Shea Butter on her arms daily.   Recent Fall: Fell about 6 days prior to OV. Says she got out of bed and felt like her knee gave way. However, she also admits to light-headedness preceding the fall. Denies LOC. Denies headaches since fall.    -  reports that she quit smoking about 3 years ago. Her smoking use included Cigarettes. She started smoking about 54 years ago. She has a 100.00 pack-year smoking history. She has never used smokeless tobacco. - Review of Systems: Per HPI. - Past Medical History: Patient Active Problem List   Diagnosis Date Noted  . Hypotension 04/06/2016  . Cough 03/08/2016  . Cataracts, bilateral 03/08/2016  . 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 Chronic combined systolic and diastolic congestive heart failure ([place]) 02/09/2016  . Lytic lesion of bone on x-ray 02/09/2016  . Shortness of breath 02/09/2016  . NSTEMI (non-ST elevated myocardial infarction) ([place])   . Acute on chronic congestive heart failure ([place])   . COPD exacerbation ([place])  12/01/2015  . Erythema of lower extremity 12/01/2015  . CAD in native artery   . Venous stasis ulcer ([place])   . COLD (chronic obstructive lung disease) ([place])   . Venous stasis of lower extremity 12/05/2014  . 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 Hyperlipidemia 02/11/2013  . Breast cancer metastasized to bone ([place]) 10/14/2012  . Chronic respiratory failure ([place]) 10/06/2012  . Chronic diastolic heart failure ([place]) 10/06/2012  . CAD (coronary artery disease) 10/06/2012  . CKD (chronic kidney disease), stage IV ([place]) 10/06/2012  . Anemia of chronic disease 10/06/2012  . Breast mass, right 10/06/2012  . History of tobacco abuse 10/06/2012  . COPD (chronic obstructive pulmonary disease) ([place]) 09/27/2012  . DM2 (diabetes mellitus, type 2) ([place]) 09/26/2012   - Medications: reviewed and updated    Objective:   Physical Exam BP (!) 99/57 (BP Location: Left Wrist, Patient Position: Sitting, Cuff Size: Normal)   Temp 97.5 F (36.4 C) (Oral)   Wt 220 lb (99.8 kg)   BMI 40.24 kg/m  Gen: NAD, alert, cooperative with exam, in wheelchair  CV: 1+ edema to shins bilaterally.  Resp: On O2 therapy. No cough while in room. Clears wet congestion from throat frequently. Diffuse wheezes throughout lung fields.  Skin: Areas of excoriation/scabbing on extensor surfaces of forearm bilaterally. Some areas with active bleeding. Excoriations/scabbing present on upper back bilaterally above scapula.       Assessment & Plan:   DM2 (diabetes mellitus, type 2) ([place]) Despite report of somewhat elevated CBGs at home, patient has  exhibited good glucose control on several recent HgB A1C measurements (most recent = 6.7). Due to her worsening CKD with eGFR of 25, patient is not a good candidate for metformin.  -discussed with patient that she continues to be diet controlled and that metformin would be potentially dangerous given her worsening CKD   Neurodermatitis Not well controlled. Suspect patient is not using  enough hydrocortisone ointment given that skin is very dry and she did not have a large supply at home.  -several tubes of hydrocortisone ordered, instructed patient that she should apply until she is greasy   Cough At this point doubt COPD or CHF contributing to cough given lack of improvement with treatment for exacerbations. Cardiology and pulmonology have also evaluated patient and are in agreement. Concern remains for aspiration leading to cough given reported worsening after water consumption. Patient had not yet begun PPI therapy to treat for potential GERD as cause of cough.  -PPI ordered to [place]  -swallow study ordered   Hypotension Patient's BP soft at OV and history of recent fall concerning for orthostatic hypotension.  -reduce Torsemide from 80 mg at night to 40 mg to hopefully improve BP -did not adjust metoprolol as did not want patient to have anginal symptoms (started on this medication after recent NSTEMI)     Phill Myron, D.O. 04/06/2016, 12:17 PM PGY-2, [place]

## 2016-04-06 ENCOUNTER — Ambulatory Visit (HOSPITAL_COMMUNITY): Payer: Commercial Managed Care - HMO

## 2016-04-06 ENCOUNTER — Other Ambulatory Visit: Payer: Commercial Managed Care - HMO

## 2016-04-06 ENCOUNTER — Ambulatory Visit: Payer: Commercial Managed Care - HMO | Admitting: Hematology

## 2016-04-06 ENCOUNTER — Telehealth: Payer: Self-pay | Admitting: Pulmonary Disease

## 2016-04-06 DIAGNOSIS — I959 Hypotension, unspecified: Secondary | ICD-10-CM | POA: Insufficient documentation

## 2016-04-06 NOTE — Assessment & Plan Note (Addendum)
Patient's BP soft at OV and history of recent fall concerning for orthostatic hypotension.  -reduce Torsemide from 80 mg at night to 40 mg to hopefully improve BP -did not adjust metoprolol as did not want patient to have anginal symptoms (started on this medication after recent NSTEMI)

## 2016-04-06 NOTE — Assessment & Plan Note (Signed)
Not well controlled. Suspect patient is not using enough hydrocortisone ointment given that skin is very dry and she did not have a large supply at home.  -several tubes of hydrocortisone ordered, instructed patient that she should apply until she is greasy

## 2016-04-06 NOTE — Assessment & Plan Note (Signed)
At this point doubt COPD or CHF contributing to cough given lack of improvement with treatment for exacerbations. Cardiology and pulmonology have also evaluated patient and are in agreement. Concern remains for aspiration leading to cough given reported worsening after water consumption. Patient had not yet begun PPI therapy to treat for potential GERD as cause of cough.  -PPI ordered to [place]  -swallow study ordered

## 2016-04-06 NOTE — Assessment & Plan Note (Signed)
Despite report of somewhat elevated CBGs at home, patient has exhibited good glucose control on several recent HgB A1C measurements (most recent = 6.7). Due to her worsening CKD with eGFR of 25, patient is not a good candidate for metformin.  -discussed with patient that she continues to be diet controlled and that metformin would be potentially dangerous given her worsening CKD

## 2016-04-06 NOTE — Telephone Encounter (Signed)
Attempted to call the pt and the line keeps ringing busy.  Will try back later.

## 2016-04-09 ENCOUNTER — Ambulatory Visit (HOSPITAL_COMMUNITY): Payer: Commercial Managed Care - HMO

## 2016-04-09 ENCOUNTER — Other Ambulatory Visit: Payer: Commercial Managed Care - HMO

## 2016-04-09 ENCOUNTER — Ambulatory Visit (HOSPITAL_BASED_OUTPATIENT_CLINIC_OR_DEPARTMENT_OTHER): Payer: Commercial Managed Care - HMO | Admitting: Hematology

## 2016-04-09 ENCOUNTER — Ambulatory Visit (HOSPITAL_COMMUNITY)
Admission: RE | Admit: 2016-04-09 | Discharge: 2016-04-09 | Disposition: A | Discharge status: Home / Self Care | Payer: Commercial Managed Care - HMO | Source: Ambulatory Visit | Attending: Hematology | Admitting: Hematology

## 2016-04-09 ENCOUNTER — Other Ambulatory Visit (HOSPITAL_BASED_OUTPATIENT_CLINIC_OR_DEPARTMENT_OTHER): Payer: Commercial Managed Care - HMO

## 2016-04-09 ENCOUNTER — Ambulatory Visit (HOSPITAL_BASED_OUTPATIENT_CLINIC_OR_DEPARTMENT_OTHER): Payer: Commercial Managed Care - HMO

## 2016-04-09 ENCOUNTER — Other Ambulatory Visit: Payer: Self-pay | Admitting: *Deleted

## 2016-04-09 ENCOUNTER — Other Ambulatory Visit: Payer: Self-pay | Admitting: Hematology

## 2016-04-09 VITALS — BP 96/42 | HR 97 | Temp 97.7°F | Resp 18 | Ht 62.0 in

## 2016-04-09 DIAGNOSIS — I517 Cardiomegaly: Secondary | ICD-10-CM | POA: Insufficient documentation

## 2016-04-09 DIAGNOSIS — C7951 Secondary malignant neoplasm of bone: Secondary | ICD-10-CM | POA: Diagnosis not present

## 2016-04-09 DIAGNOSIS — D631 Anemia in chronic kidney disease: Secondary | ICD-10-CM

## 2016-04-09 DIAGNOSIS — K760 Fatty (change of) liver, not elsewhere classified: Secondary | ICD-10-CM | POA: Insufficient documentation

## 2016-04-09 DIAGNOSIS — D649 Anemia, unspecified: Secondary | ICD-10-CM | POA: Insufficient documentation

## 2016-04-09 DIAGNOSIS — D638 Anemia in other chronic diseases classified elsewhere: Secondary | ICD-10-CM

## 2016-04-09 DIAGNOSIS — N184 Chronic kidney disease, stage 4 (severe): Secondary | ICD-10-CM | POA: Diagnosis not present

## 2016-04-09 DIAGNOSIS — R599 Enlarged lymph nodes, unspecified: Secondary | ICD-10-CM | POA: Insufficient documentation

## 2016-04-09 DIAGNOSIS — R911 Solitary pulmonary nodule: Secondary | ICD-10-CM | POA: Diagnosis not present

## 2016-04-09 DIAGNOSIS — I2583 Coronary atherosclerosis due to lipid rich plaque: Secondary | ICD-10-CM

## 2016-04-09 DIAGNOSIS — J410 Simple chronic bronchitis: Secondary | ICD-10-CM

## 2016-04-09 DIAGNOSIS — I2510 Atherosclerotic heart disease of native coronary artery without angina pectoris: Secondary | ICD-10-CM | POA: Diagnosis not present

## 2016-04-09 DIAGNOSIS — E1122 Type 2 diabetes mellitus with diabetic chronic kidney disease: Secondary | ICD-10-CM

## 2016-04-09 DIAGNOSIS — R05 Cough: Secondary | ICD-10-CM | POA: Diagnosis not present

## 2016-04-09 DIAGNOSIS — E119 Type 2 diabetes mellitus without complications: Secondary | ICD-10-CM

## 2016-04-09 DIAGNOSIS — I288 Other diseases of pulmonary vessels: Secondary | ICD-10-CM | POA: Insufficient documentation

## 2016-04-09 DIAGNOSIS — C50919 Malignant neoplasm of unspecified site of unspecified female breast: Secondary | ICD-10-CM

## 2016-04-09 DIAGNOSIS — I700 Atherosclerosis of aorta: Secondary | ICD-10-CM | POA: Insufficient documentation

## 2016-04-09 DIAGNOSIS — N183 Chronic kidney disease, stage 3 (moderate): Secondary | ICD-10-CM

## 2016-04-09 LAB — COMPREHENSIVE METABOLIC PANEL
ALBUMIN: 2.4 g/dL — AB (ref 3.5–5.0)
ALK PHOS: 161 U/L — AB (ref 40–150)
ALT: 22 U/L (ref 0–55)
AST: 47 U/L — AB (ref 5–34)
Anion Gap: 11 mEq/L (ref 3–11)
BILIRUBIN TOTAL: 0.44 mg/dL (ref 0.20–1.20)
BUN: 24.9 mg/dL (ref 7.0–26.0)
CO2: 22 meq/L (ref 22–29)
Calcium: 8.5 mg/dL (ref 8.4–10.4)
Chloride: 108 mEq/L (ref 98–109)
Creatinine: 1.5 mg/dL — ABNORMAL HIGH (ref 0.6–1.1)
EGFR: 35 mL/min/{1.73_m2} — AB (ref >90)
GLUCOSE: 239 mg/dL — AB (ref 70–140)
Potassium: 3.7 mEq/L (ref 3.5–5.1)
SODIUM: 141 meq/L (ref 136–145)
TOTAL PROTEIN: 5.8 g/dL — AB (ref 6.4–8.3)

## 2016-04-09 LAB — CBC WITH DIFFERENTIAL/PLATELET
BASO%: 1.3 % (ref 0.0–2.0)
Basophils Absolute: 0.1 10*3/uL (ref 0.0–0.1)
EOS ABS: 0.4 10*3/uL (ref 0.0–0.5)
EOS%: 5.9 % (ref 0.0–7.0)
HCT: 21.3 % — ABNORMAL LOW (ref 34.8–46.6)
HEMOGLOBIN: 6.8 g/dL — AB (ref 11.6–15.9)
LYMPH%: 5.8 % — AB (ref 14.0–49.7)
MCH: 27.8 pg (ref 25.1–34.0)
MCHC: 31.9 g/dL (ref 31.5–36.0)
MCV: 87.1 fL (ref 79.5–101.0)
MONO#: 0.5 10*3/uL (ref 0.1–0.9)
MONO%: 8.5 % (ref 0.0–14.0)
NEUT%: 78.5 % — ABNORMAL HIGH (ref 38.4–76.8)
NEUTROS ABS: 5 10*3/uL (ref 1.5–6.5)
Platelets: 288 10*3/uL (ref 145–400)
RBC: 2.45 10*6/uL — AB (ref 3.70–5.45)
RDW: 21 % — AB (ref 11.2–14.5)
WBC: 6.4 10*3/uL (ref 3.9–10.3)
lymph#: 0.4 10*3/uL — ABNORMAL LOW (ref 0.9–3.3)

## 2016-04-09 LAB — RETICULOCYTES (CHCC)
Immature Retic Fract: 24.8 % — ABNORMAL HIGH (ref 1.60–10.00)
RBC: 2.44 10*6/uL — ABNORMAL LOW (ref 3.70–5.45)
RETIC %: 4.73 % — AB (ref 0.70–2.10)
Retic Ct Abs: 115.41 10*3/uL — ABNORMAL HIGH (ref 33.70–90.70)

## 2016-04-09 LAB — PREPARE RBC (CROSSMATCH)

## 2016-04-09 LAB — TECHNOLOGIST REVIEW

## 2016-04-09 NOTE — Telephone Encounter (Signed)
Attempted to call the pt but no answer and no VM.  Will try back later.  

## 2016-04-10 ENCOUNTER — Encounter: Payer: Self-pay | Admitting: Hematology

## 2016-04-10 LAB — HAPTOGLOBIN: HAPTOGLOBIN: 354 mg/dL — AB (ref 34–200)

## 2016-04-10 LAB — CANCER ANTIGEN 27.29: CA 27.29: 127.8 U/mL — ABNORMAL HIGH (ref 0.0–38.6)

## 2016-04-10 LAB — LACTATE DEHYDROGENASE: LDH: 303 U/L — ABNORMAL HIGH (ref 125–245)

## 2016-04-10 NOTE — Progress Notes (Signed)
Placed form from [facility] on desk for dr Burr Medico about ibrance.

## 2016-04-10 NOTE — Telephone Encounter (Signed)
Attempted to contact pt. No answer, no option to leave a message. Will try back.  

## 2016-04-11 ENCOUNTER — Ambulatory Visit (HOSPITAL_COMMUNITY)
Admission: RE | Admit: 2016-04-11 | Discharge: 2016-04-11 | Disposition: A | Discharge status: Home / Self Care | Payer: Commercial Managed Care - HMO | Source: Ambulatory Visit | Attending: Family Medicine | Admitting: Family Medicine

## 2016-04-11 DIAGNOSIS — D638 Anemia in other chronic diseases classified elsewhere: Secondary | ICD-10-CM

## 2016-04-11 DIAGNOSIS — D649 Anemia, unspecified: Secondary | ICD-10-CM | POA: Diagnosis not present

## 2016-04-11 MED ORDER — SODIUM CHLORIDE 0.9 % IV SOLN
250.0000 mL | Freq: Once | INTRAVENOUS | Status: AC
  Administered 2016-04-11: 250 mL via INTRAVENOUS

## 2016-04-11 MED ORDER — SODIUM CHLORIDE 0.9% FLUSH: 10.0000 mL | INTRAVENOUS | Status: DC | PRN

## 2016-04-11 MED ORDER — DIPHENHYDRAMINE HCL 25 MG PO CAPS
25.0000 mg | ORAL_CAPSULE | Freq: Once | ORAL | Status: AC
  Administered 2016-04-11: 25 mg via ORAL
  Filled 2016-04-11: qty 1

## 2016-04-11 MED ORDER — ACETAMINOPHEN 325 MG PO TABS
650.0000 mg | ORAL_TABLET | Freq: Once | ORAL | Status: AC
  Administered 2016-04-11: 650 mg via ORAL
  Filled 2016-04-11: qty 2

## 2016-04-11 MED ORDER — HEPARIN SOD (PORK) LOCK FLUSH 100 UNIT/ML IV SOLN: 250.0000 [IU] | INTRAVENOUS | Status: DC | PRN

## 2016-04-11 MED ORDER — SODIUM CHLORIDE 0.9% FLUSH: 3.0000 mL | INTRAVENOUS | Status: DC | PRN

## 2016-04-11 MED ORDER — HEPARIN SOD (PORK) LOCK FLUSH 100 UNIT/ML IV SOLN: 500.0000 [IU] | Freq: Every day | INTRAVENOUS | Status: DC | PRN

## 2016-04-11 NOTE — Progress Notes (Signed)
Provider: Truitt Merle MD  Associated Diagnosis: Symptomatic Anemia  Procedure: Transfusion of 2 units PRBC's via PIV as ordered.  Tolerated procedure well. No reaction. Continued home oxygen dose during transfusion. Patient said she took her home medications before coming in and that included furosemide. Went over discharge instructions and copy given to patient and family member present during most of the transfusion. Alert, oriented and ambulatory to wheelchair. Discharged to home.

## 2016-04-12 ENCOUNTER — Telehealth: Payer: Self-pay | Admitting: Hematology

## 2016-04-12 LAB — TYPE AND SCREEN
ABO/RH(D): A POS
Antibody Screen: NEGATIVE
UNIT DIVISION: 0
Unit division: 0

## 2016-04-12 NOTE — Telephone Encounter (Signed)
Patient called and moved 8/18 f/u to 8/28. Patient has new date/time.

## 2016-04-13 ENCOUNTER — Ambulatory Visit: Payer: Commercial Managed Care - HMO | Admitting: Hematology

## 2016-04-13 ENCOUNTER — Telehealth: Payer: Self-pay | Admitting: Internal Medicine

## 2016-04-13 NOTE — Telephone Encounter (Signed)
Pt wanted let us know she does not have acid reflux and will not be taking the medication for it. Please advise. Thanks! ep

## 2016-04-13 NOTE — Telephone Encounter (Signed)
Tried to contact pt to inform her of below and the phone only rang with no option to LVM. Please inform her of the below message. Katharina Caper, Montavis Schubring D, [place]

## 2016-04-13 NOTE — Telephone Encounter (Signed)
If she does not want to take the acid reflux medication that is fine.   Phill Myron, D.O. 04/13/2016, 5:19 PM PGY-2, [place]

## 2016-04-18 ENCOUNTER — Telehealth: Payer: Self-pay | Admitting: Internal Medicine

## 2016-04-18 ENCOUNTER — Telehealth: Payer: Self-pay | Admitting: *Deleted

## 2016-04-18 ENCOUNTER — Telehealth: Payer: Self-pay | Admitting: Pulmonary Disease

## 2016-04-18 NOTE — Telephone Encounter (Signed)
Called patient with these instructions.  Denies need for pain medicine refills or changes at this time.  Asked "why do I need radiation and do I still need to come in on Monday."  Advised radiation will help bone pain and to keep Monday's F/U.  No further questions.

## 2016-04-18 NOTE — Telephone Encounter (Signed)
Pt says she is not taking her acid reflux medicine.  She is also not taking metropolol because it runs her bp too low.

## 2016-04-18 NOTE — Telephone Encounter (Signed)
Please let her know that I will refer her back to rad/onc Dr. Sondra Come. Ask her if she needs pain medication.   Thanks,  Truitt Merle MD

## 2016-04-18 NOTE — Telephone Encounter (Signed)
"  I need to know what to do.  My left knee is flaring up.  I put lidocaine cream from Dr. Juleen China on it and took two hydrocodone.  do I need to come in or what?  Next F/U is 04-23-2016.  The knee feels numb and doesn't hurt right now.  Return number [phone]. "

## 2016-04-18 NOTE — Telephone Encounter (Signed)
    04/18/16 10:20 AM  Note    Pt says she is not taking her acid reflux medicine.  She is also not taking metropolol because it runs her bp too low.

## 2016-04-18 NOTE — Telephone Encounter (Signed)
New phone note has been created for this message. Closing this one. Katharina Caper, April D, [place]

## 2016-04-18 NOTE — Telephone Encounter (Signed)
I called and spoke with the pt  She states needing letter faxed to her land lord stating that it would be beneficial for them to fix her central air due to her breathing problems  She wants letter faxed to attn Linda Hunter-Hopkins at [phone]  Please advise, thanks!

## 2016-04-18 NOTE — Telephone Encounter (Signed)
Patient wanting to know the status of her swallowing test. States no has ever called her with an appointment.

## 2016-04-19 ENCOUNTER — Inpatient Hospital Stay (HOSPITAL_COMMUNITY)
Admission: EM | Admit: 2016-04-19 | Discharge: 2016-04-28 | DRG: 871 | Disposition: A | Discharge status: Home Health | Payer: Commercial Managed Care - HMO | Attending: Internal Medicine | Admitting: Internal Medicine

## 2016-04-19 ENCOUNTER — Emergency Department (HOSPITAL_COMMUNITY): Payer: Commercial Managed Care - HMO

## 2016-04-19 ENCOUNTER — Other Ambulatory Visit: Payer: Self-pay

## 2016-04-19 ENCOUNTER — Encounter (HOSPITAL_COMMUNITY): Payer: Self-pay

## 2016-04-19 DIAGNOSIS — Y95 Nosocomial condition: Secondary | ICD-10-CM | POA: Diagnosis present

## 2016-04-19 DIAGNOSIS — Z9981 Dependence on supplemental oxygen: Secondary | ICD-10-CM

## 2016-04-19 DIAGNOSIS — N184 Chronic kidney disease, stage 4 (severe): Secondary | ICD-10-CM | POA: Diagnosis present

## 2016-04-19 DIAGNOSIS — E1122 Type 2 diabetes mellitus with diabetic chronic kidney disease: Secondary | ICD-10-CM | POA: Diagnosis present

## 2016-04-19 DIAGNOSIS — D630 Anemia in neoplastic disease: Secondary | ICD-10-CM | POA: Diagnosis present

## 2016-04-19 DIAGNOSIS — N183 Chronic kidney disease, stage 3 unspecified: Secondary | ICD-10-CM | POA: Diagnosis present

## 2016-04-19 DIAGNOSIS — F0390 Unspecified dementia without behavioral disturbance: Secondary | ICD-10-CM | POA: Diagnosis present

## 2016-04-19 DIAGNOSIS — N179 Acute kidney failure, unspecified: Secondary | ICD-10-CM | POA: Diagnosis present

## 2016-04-19 DIAGNOSIS — R1313 Dysphagia, pharyngeal phase: Secondary | ICD-10-CM | POA: Diagnosis present

## 2016-04-19 DIAGNOSIS — C7951 Secondary malignant neoplasm of bone: Secondary | ICD-10-CM | POA: Diagnosis present

## 2016-04-19 DIAGNOSIS — D631 Anemia in chronic kidney disease: Secondary | ICD-10-CM | POA: Diagnosis present

## 2016-04-19 DIAGNOSIS — G4733 Obstructive sleep apnea (adult) (pediatric): Secondary | ICD-10-CM | POA: Diagnosis present

## 2016-04-19 DIAGNOSIS — J189 Pneumonia, unspecified organism: Secondary | ICD-10-CM | POA: Diagnosis present

## 2016-04-19 DIAGNOSIS — D638 Anemia in other chronic diseases classified elsewhere: Secondary | ICD-10-CM | POA: Diagnosis present

## 2016-04-19 DIAGNOSIS — R1310 Dysphagia, unspecified: Secondary | ICD-10-CM

## 2016-04-19 DIAGNOSIS — J441 Chronic obstructive pulmonary disease with (acute) exacerbation: Secondary | ICD-10-CM | POA: Diagnosis present

## 2016-04-19 DIAGNOSIS — F329 Major depressive disorder, single episode, unspecified: Secondary | ICD-10-CM | POA: Diagnosis present

## 2016-04-19 DIAGNOSIS — E876 Hypokalemia: Secondary | ICD-10-CM | POA: Diagnosis not present

## 2016-04-19 DIAGNOSIS — G9340 Encephalopathy, unspecified: Secondary | ICD-10-CM | POA: Diagnosis present

## 2016-04-19 DIAGNOSIS — E785 Hyperlipidemia, unspecified: Secondary | ICD-10-CM | POA: Diagnosis present

## 2016-04-19 DIAGNOSIS — Z515 Encounter for palliative care: Secondary | ICD-10-CM | POA: Diagnosis not present

## 2016-04-19 DIAGNOSIS — J9622 Acute and chronic respiratory failure with hypercapnia: Secondary | ICD-10-CM | POA: Diagnosis present

## 2016-04-19 DIAGNOSIS — C329 Malignant neoplasm of larynx, unspecified: Secondary | ICD-10-CM | POA: Diagnosis present

## 2016-04-19 DIAGNOSIS — E870 Hyperosmolality and hypernatremia: Secondary | ICD-10-CM | POA: Diagnosis present

## 2016-04-19 DIAGNOSIS — C50919 Malignant neoplasm of unspecified site of unspecified female breast: Secondary | ICD-10-CM | POA: Diagnosis not present

## 2016-04-19 DIAGNOSIS — T380X5A Adverse effect of glucocorticoids and synthetic analogues, initial encounter: Secondary | ICD-10-CM | POA: Diagnosis present

## 2016-04-19 DIAGNOSIS — C7800 Secondary malignant neoplasm of unspecified lung: Secondary | ICD-10-CM | POA: Diagnosis present

## 2016-04-19 DIAGNOSIS — K219 Gastro-esophageal reflux disease without esophagitis: Secondary | ICD-10-CM | POA: Diagnosis present

## 2016-04-19 DIAGNOSIS — I5021 Acute systolic (congestive) heart failure: Secondary | ICD-10-CM | POA: Diagnosis not present

## 2016-04-19 DIAGNOSIS — J449 Chronic obstructive pulmonary disease, unspecified: Secondary | ICD-10-CM | POA: Diagnosis not present

## 2016-04-19 DIAGNOSIS — I130 Hypertensive heart and chronic kidney disease with heart failure and stage 1 through stage 4 chronic kidney disease, or unspecified chronic kidney disease: Secondary | ICD-10-CM | POA: Diagnosis present

## 2016-04-19 DIAGNOSIS — J9621 Acute and chronic respiratory failure with hypoxia: Secondary | ICD-10-CM | POA: Diagnosis present

## 2016-04-19 DIAGNOSIS — R0600 Dyspnea, unspecified: Secondary | ICD-10-CM | POA: Diagnosis not present

## 2016-04-19 DIAGNOSIS — Z86001 Personal history of in-situ neoplasm of cervix uteri: Secondary | ICD-10-CM | POA: Diagnosis not present

## 2016-04-19 DIAGNOSIS — R0602 Shortness of breath: Secondary | ICD-10-CM

## 2016-04-19 DIAGNOSIS — Z7902 Long term (current) use of antithrombotics/antiplatelets: Secondary | ICD-10-CM

## 2016-04-19 DIAGNOSIS — I5042 Chronic combined systolic (congestive) and diastolic (congestive) heart failure: Secondary | ICD-10-CM | POA: Diagnosis present

## 2016-04-19 DIAGNOSIS — C50911 Malignant neoplasm of unspecified site of right female breast: Secondary | ICD-10-CM | POA: Diagnosis present

## 2016-04-19 DIAGNOSIS — Z6841 Body Mass Index (BMI) 40.0 and over, adult: Secondary | ICD-10-CM

## 2016-04-19 DIAGNOSIS — D7289 Other specified disorders of white blood cells: Secondary | ICD-10-CM

## 2016-04-19 DIAGNOSIS — Z7189 Other specified counseling: Secondary | ICD-10-CM

## 2016-04-19 DIAGNOSIS — Z66 Do not resuscitate: Secondary | ICD-10-CM | POA: Diagnosis not present

## 2016-04-19 DIAGNOSIS — G893 Neoplasm related pain (acute) (chronic): Secondary | ICD-10-CM | POA: Diagnosis present

## 2016-04-19 DIAGNOSIS — J440 Chronic obstructive pulmonary disease with acute lower respiratory infection: Secondary | ICD-10-CM | POA: Diagnosis present

## 2016-04-19 DIAGNOSIS — E1165 Type 2 diabetes mellitus with hyperglycemia: Secondary | ICD-10-CM | POA: Diagnosis present

## 2016-04-19 DIAGNOSIS — J9690 Respiratory failure, unspecified, unspecified whether with hypoxia or hypercapnia: Secondary | ICD-10-CM

## 2016-04-19 DIAGNOSIS — I252 Old myocardial infarction: Secondary | ICD-10-CM

## 2016-04-19 DIAGNOSIS — Z79899 Other long term (current) drug therapy: Secondary | ICD-10-CM

## 2016-04-19 DIAGNOSIS — R6520 Severe sepsis without septic shock: Secondary | ICD-10-CM | POA: Diagnosis present

## 2016-04-19 DIAGNOSIS — Z923 Personal history of irradiation: Secondary | ICD-10-CM

## 2016-04-19 DIAGNOSIS — I509 Heart failure, unspecified: Secondary | ICD-10-CM | POA: Diagnosis not present

## 2016-04-19 DIAGNOSIS — A419 Sepsis, unspecified organism: Principal | ICD-10-CM | POA: Diagnosis present

## 2016-04-19 DIAGNOSIS — I2510 Atherosclerotic heart disease of native coronary artery without angina pectoris: Secondary | ICD-10-CM | POA: Diagnosis present

## 2016-04-19 DIAGNOSIS — D72825 Bandemia: Secondary | ICD-10-CM | POA: Diagnosis not present

## 2016-04-19 DIAGNOSIS — Z87891 Personal history of nicotine dependence: Secondary | ICD-10-CM

## 2016-04-19 DIAGNOSIS — Z7982 Long term (current) use of aspirin: Secondary | ICD-10-CM

## 2016-04-19 LAB — CBC WITH DIFFERENTIAL/PLATELET
BASOS ABS: 0 10*3/uL (ref 0.0–0.1)
Basophils Relative: 0 %
EOS ABS: 0.5 10*3/uL (ref 0.0–0.7)
Eosinophils Relative: 6 %
HCT: 25.7 % — ABNORMAL LOW (ref 36.0–46.0)
Hemoglobin: 7.8 g/dL — ABNORMAL LOW (ref 12.0–15.0)
LYMPHS PCT: 6 %
Lymphs Abs: 0.5 10*3/uL — ABNORMAL LOW (ref 0.7–4.0)
MCH: 27.5 pg (ref 26.0–34.0)
MCHC: 30.4 g/dL (ref 30.0–36.0)
MCV: 90.5 fL (ref 78.0–100.0)
Monocytes Absolute: 0.7 10*3/uL (ref 0.1–1.0)
Monocytes Relative: 8 %
NEUTROS ABS: 7.3 10*3/uL (ref 1.7–7.7)
Neutrophils Relative %: 80 %
PLATELETS: 213 10*3/uL (ref 150–400)
RBC: 2.84 MIL/uL — ABNORMAL LOW (ref 3.87–5.11)
RDW: 20.1 % — ABNORMAL HIGH (ref 11.5–15.5)
WBC: 9 10*3/uL (ref 4.0–10.5)

## 2016-04-19 LAB — BLOOD GAS, ARTERIAL
ACID-BASE DEFICIT: 2.2 mmol/L — AB (ref 0.0–2.0)
Acid-Base Excess: 2.6 mmol/L — ABNORMAL HIGH (ref 0.0–2.0)
Acid-base deficit: 2.3 mmol/L — ABNORMAL HIGH (ref 0.0–2.0)
BICARBONATE: 29.6 meq/L — AB (ref 20.0–24.0)
Bicarbonate: 26.5 mEq/L — ABNORMAL HIGH (ref 20.0–24.0)
Bicarbonate: 25.1 mEq/L — ABNORMAL HIGH (ref 20.0–24.0)
DRAWN BY: 232811
DRAWN BY: 232811
Drawn by: 257701
FIO2: 100
FIO2: 60
MECHVT: 400 mL
MECHVT: 400 mL
O2 Content: 5 L/min
O2 SAT: 85.4 %
O2 SAT: 98.7 %
O2 SAT: 96.5 %
PATIENT TEMPERATURE: 98.6
PATIENT TEMPERATURE: 98.1
PATIENT TEMPERATURE: 99
PCO2 ART: 73.7 mmHg — AB (ref 35.0–45.0)
PCO2 ART: 58.1 mmHg — AB (ref 35.0–45.0)
PEEP/CPAP: 5 cmH2O
PEEP: 5 cmH2O
PH ART: 7.18 — AB (ref 7.350–7.450)
PH ART: 7.26 — AB (ref 7.350–7.450)
PO2 ART: 106 mmHg — AB (ref 80.0–100.0)
RATE: 14 resp/min
RATE: 20 resp/min
TCO2: 28.3 mmol/L (ref 0–100)
TCO2: 26.3 mmol/L (ref 0–100)
TCO2: 23.1 mmol/L (ref 0–100)
pCO2 arterial: 63.1 mmHg (ref 35.0–45.0)
pH, Arterial: 7.292 — ABNORMAL LOW (ref 7.350–7.450)
pO2, Arterial: 58.9 mmHg — ABNORMAL LOW (ref 80.0–100.0)
pO2, Arterial: 210 mmHg — ABNORMAL HIGH (ref 80.0–100.0)

## 2016-04-19 LAB — CREATININE, SERUM
Creatinine, Ser: 1.65 mg/dL — ABNORMAL HIGH (ref 0.44–1.00)
GFR calc Af Amer: 36 mL/min — ABNORMAL LOW (ref >60)
GFR, EST NON AFRICAN AMERICAN: 31 mL/min — AB (ref >60)

## 2016-04-19 LAB — CBC
HEMATOCRIT: 25.8 % — AB (ref 36.0–46.0)
HEMOGLOBIN: 7.9 g/dL — AB (ref 12.0–15.0)
MCH: 27.8 pg (ref 26.0–34.0)
MCHC: 30.6 g/dL (ref 30.0–36.0)
MCV: 90.8 fL (ref 78.0–100.0)
Platelets: 198 10*3/uL (ref 150–400)
RBC: 2.84 MIL/uL — ABNORMAL LOW (ref 3.87–5.11)
RDW: 19.9 % — ABNORMAL HIGH (ref 11.5–15.5)
WBC: 10.1 10*3/uL (ref 4.0–10.5)

## 2016-04-19 LAB — COMPREHENSIVE METABOLIC PANEL
ALT: 32 U/L (ref 14–54)
ANION GAP: 8 (ref 5–15)
AST: 42 U/L — ABNORMAL HIGH (ref 15–41)
Albumin: 2.8 g/dL — ABNORMAL LOW (ref 3.5–5.0)
Alkaline Phosphatase: 133 U/L — ABNORMAL HIGH (ref 38–126)
BUN: 40 mg/dL — ABNORMAL HIGH (ref 6–20)
CHLORIDE: 104 mmol/L (ref 101–111)
CO2: 29 mmol/L (ref 22–32)
Calcium: 9.2 mg/dL (ref 8.9–10.3)
Creatinine, Ser: 1.65 mg/dL — ABNORMAL HIGH (ref 0.44–1.00)
GFR calc non Af Amer: 31 mL/min — ABNORMAL LOW (ref >60)
GFR, EST AFRICAN AMERICAN: 36 mL/min — AB (ref >60)
Glucose, Bld: 176 mg/dL — ABNORMAL HIGH (ref 65–99)
Potassium: 3.8 mmol/L (ref 3.5–5.1)
SODIUM: 141 mmol/L (ref 135–145)
Total Bilirubin: 0.7 mg/dL (ref 0.3–1.2)
Total Protein: 5.8 g/dL — ABNORMAL LOW (ref 6.5–8.1)

## 2016-04-19 LAB — PROTIME-INR
INR: 1.15
Prothrombin Time: 14.7 seconds (ref 11.4–15.2)

## 2016-04-19 LAB — I-STAT CG4 LACTIC ACID, ED
LACTIC ACID, VENOUS: 1.51 mmol/L (ref 0.5–1.9)
Lactic Acid, Venous: 1.14 mmol/L (ref 0.5–1.9)

## 2016-04-19 LAB — TROPONIN I

## 2016-04-19 LAB — PHOSPHORUS: PHOSPHORUS: 6 mg/dL — AB (ref 2.5–4.6)

## 2016-04-19 LAB — APTT: APTT: 26 s (ref 24–36)

## 2016-04-19 LAB — PROCALCITONIN: Procalcitonin: 1.67 ng/mL

## 2016-04-19 LAB — MAGNESIUM: MAGNESIUM: 2.5 mg/dL — AB (ref 1.7–2.4)

## 2016-04-19 LAB — BRAIN NATRIURETIC PEPTIDE: B Natriuretic Peptide: 78.3 pg/mL (ref 0.0–100.0)

## 2016-04-19 MED ORDER — IPRATROPIUM BROMIDE 0.02 % IN SOLN
1.0000 mg | Freq: Once | RESPIRATORY_TRACT | Status: AC
  Administered 2016-04-19: 1 mg via RESPIRATORY_TRACT
  Filled 2016-04-19: qty 5

## 2016-04-19 MED ORDER — SODIUM CHLORIDE 0.9 % IV BOLUS (SEPSIS)
1000.0000 mL | Freq: Once | INTRAVENOUS | Status: AC
  Administered 2016-04-19: 1000 mL via INTRAVENOUS

## 2016-04-19 MED ORDER — ANTISEPTIC ORAL RINSE SOLUTION (CORINZ)
7.0000 mL | Freq: Four times a day (QID) | OROMUCOSAL | Status: DC
  Administered 2016-04-19: 7 mL via OROMUCOSAL

## 2016-04-19 MED ORDER — ALBUTEROL SULFATE (2.5 MG/3ML) 0.083% IN NEBU
5.0000 mg | INHALATION_SOLUTION | Freq: Once | RESPIRATORY_TRACT | Status: AC
Start: 2016-04-19 — End: 2016-04-19
  Administered 2016-04-19: 5 mg via RESPIRATORY_TRACT
  Filled 2016-04-19: qty 6

## 2016-04-19 MED ORDER — VANCOMYCIN HCL IN DEXTROSE 1-5 GM/200ML-% IV SOLN
1000.0000 mg | Freq: Once | INTRAVENOUS | Status: AC
  Administered 2016-04-19: 1000 mg via INTRAVENOUS
  Filled 2016-04-19: qty 200

## 2016-04-19 MED ORDER — CHLORHEXIDINE GLUCONATE 0.12% ORAL RINSE (MEDLINE KIT)
15.0000 mL | Freq: Two times a day (BID) | OROMUCOSAL | Status: DC
  Administered 2016-04-20 (×2): 15 mL via OROMUCOSAL

## 2016-04-19 MED ORDER — FENTANYL BOLUS VIA INFUSION
25.0000 ug | INTRAVENOUS | Status: DC | PRN
  Filled 2016-04-19: qty 25

## 2016-04-19 MED ORDER — METHYLPREDNISOLONE SODIUM SUCC 125 MG IJ SOLR
125.0000 mg | Freq: Once | INTRAMUSCULAR | Status: AC
  Administered 2016-04-19: 125 mg via INTRAVENOUS
  Filled 2016-04-19: qty 2

## 2016-04-19 MED ORDER — ASPIRIN EC 81 MG PO TBEC
81.0000 mg | DELAYED_RELEASE_TABLET | Freq: Every day | ORAL | Status: DC
  Filled 2016-04-19: qty 1

## 2016-04-19 MED ORDER — IPRATROPIUM-ALBUTEROL 0.5-2.5 (3) MG/3ML IN SOLN
3.0000 mL | RESPIRATORY_TRACT | Status: DC
  Administered 2016-04-19 – 2016-04-21 (×8): 3 mL via RESPIRATORY_TRACT
  Filled 2016-04-19 (×7): qty 3

## 2016-04-19 MED ORDER — INSULIN ASPART 100 UNIT/ML ~~LOC~~ SOLN
0.0000 [IU] | SUBCUTANEOUS | Status: DC
  Administered 2016-04-20: 5 [IU] via SUBCUTANEOUS
  Administered 2016-04-20: 3 [IU] via SUBCUTANEOUS
  Administered 2016-04-20 (×2): 5 [IU] via SUBCUTANEOUS
  Administered 2016-04-20: 3 [IU] via SUBCUTANEOUS
  Administered 2016-04-20: 5 [IU] via SUBCUTANEOUS
  Administered 2016-04-21 (×2): 3 [IU] via SUBCUTANEOUS
  Administered 2016-04-21 (×2): 2 [IU] via SUBCUTANEOUS
  Administered 2016-04-21: 1 [IU] via SUBCUTANEOUS
  Administered 2016-04-22 (×3): 2 [IU] via SUBCUTANEOUS
  Administered 2016-04-22: 3 [IU] via SUBCUTANEOUS
  Administered 2016-04-22: 2 [IU] via SUBCUTANEOUS
  Administered 2016-04-23: 1 [IU] via SUBCUTANEOUS
  Administered 2016-04-23: 2 [IU] via SUBCUTANEOUS
  Administered 2016-04-23: 3 [IU] via SUBCUTANEOUS

## 2016-04-19 MED ORDER — FENTANYL CITRATE (PF) 100 MCG/2ML IJ SOLN
50.0000 ug | Freq: Once | INTRAMUSCULAR | Status: AC
  Administered 2016-04-19: 50 ug via INTRAVENOUS
  Filled 2016-04-19: qty 2

## 2016-04-19 MED ORDER — ACETAMINOPHEN 500 MG PO TABS
1000.0000 mg | ORAL_TABLET | Freq: Once | ORAL | Status: DC
  Filled 2016-04-19 (×2): qty 2

## 2016-04-19 MED ORDER — SODIUM CHLORIDE 0.9 % IV SOLN
10.0000 mg | Freq: Two times a day (BID) | INTRAVENOUS | Status: DC
  Administered 2016-04-19 – 2016-04-22 (×7): 10 mg via INTRAVENOUS
  Filled 2016-04-19 (×10): qty 1

## 2016-04-19 MED ORDER — SODIUM CHLORIDE 0.9 % IV SOLN
1250.0000 mg | INTRAVENOUS | Status: DC
  Administered 2016-04-20: 1250 mg via INTRAVENOUS
  Filled 2016-04-19: qty 1250

## 2016-04-19 MED ORDER — MIDAZOLAM HCL 2 MG/2ML IJ SOLN: 1.0000 mg | INTRAMUSCULAR | Status: DC | PRN

## 2016-04-19 MED ORDER — METHYLPREDNISOLONE SODIUM SUCC 40 MG IJ SOLR
40.0000 mg | Freq: Four times a day (QID) | INTRAMUSCULAR | Status: DC
  Administered 2016-04-20 – 2016-04-21 (×6): 40 mg via INTRAVENOUS
  Filled 2016-04-19 (×6): qty 1

## 2016-04-19 MED ORDER — BUDESONIDE 0.5 MG/2ML IN SUSP
0.5000 mg | Freq: Two times a day (BID) | RESPIRATORY_TRACT | Status: DC
  Administered 2016-04-19 – 2016-04-28 (×18): 0.5 mg via RESPIRATORY_TRACT
  Filled 2016-04-19 (×17): qty 2

## 2016-04-19 MED ORDER — MIDAZOLAM HCL 2 MG/2ML IJ SOLN
1.0000 mg | INTRAMUSCULAR | Status: DC | PRN
  Administered 2016-04-19: 1 mg via INTRAVENOUS
  Filled 2016-04-19: qty 2

## 2016-04-19 MED ORDER — MAGNESIUM SULFATE 50 % IJ SOLN: 2.0000 g | Freq: Once | INTRAMUSCULAR | Status: DC

## 2016-04-19 MED ORDER — SUCCINYLCHOLINE CHLORIDE 20 MG/ML IJ SOLN
125.0000 mg | Freq: Once | INTRAMUSCULAR | Status: AC
  Administered 2016-04-19: 125 mg via INTRAVENOUS
  Filled 2016-04-19: qty 6.25

## 2016-04-19 MED ORDER — HEPARIN SODIUM (PORCINE) 5000 UNIT/ML IJ SOLN
5000.0000 [IU] | Freq: Three times a day (TID) | INTRAMUSCULAR | Status: DC
  Filled 2016-04-19 (×8): qty 1

## 2016-04-19 MED ORDER — SODIUM CHLORIDE 0.9 % IV SOLN: 250.0000 mL | INTRAVENOUS | Status: DC | PRN

## 2016-04-19 MED ORDER — MAGNESIUM SULFATE 2 GM/50ML IV SOLN
2.0000 g | Freq: Once | INTRAVENOUS | Status: AC
  Administered 2016-04-19: 2 g via INTRAVENOUS
  Filled 2016-04-19: qty 50

## 2016-04-19 MED ORDER — KETAMINE HCL-SODIUM CHLORIDE 100-0.9 MG/10ML-% IV SOSY
1.0000 mg/kg | PREFILLED_SYRINGE | Freq: Once | INTRAVENOUS | Status: AC
  Administered 2016-04-19: 100 mg via INTRAVENOUS

## 2016-04-19 MED ORDER — MIRTAZAPINE 15 MG PO TABS
15.0000 mg | ORAL_TABLET | Freq: Every day | ORAL | Status: DC
  Administered 2016-04-21 – 2016-04-27 (×7): 15 mg via ORAL
  Filled 2016-04-19 (×2): qty 1
  Filled 2016-04-19: qty 0.5
  Filled 2016-04-19 (×6): qty 1
  Filled 2016-04-19 (×2): qty 0.5

## 2016-04-19 MED ORDER — SODIUM CHLORIDE 0.9 % IV BOLUS (SEPSIS)
500.0000 mL | Freq: Once | INTRAVENOUS | Status: AC
Start: 2016-04-19 — End: 2016-04-20
  Administered 2016-04-19: 500 mL via INTRAVENOUS

## 2016-04-19 MED ORDER — SODIUM CHLORIDE 0.9 % IV SOLN
25.0000 ug/h | INTRAVENOUS | Status: DC
  Administered 2016-04-19: 50 ug/h via INTRAVENOUS
  Filled 2016-04-19: qty 50

## 2016-04-19 MED ORDER — VITAL HIGH PROTEIN PO LIQD
1000.0000 mL | ORAL | Status: DC
  Administered 2016-04-20: 08:00:00
  Administered 2016-04-20: 1000 mL
  Filled 2016-04-19: qty 1000

## 2016-04-19 MED ORDER — PIPERACILLIN-TAZOBACTAM 3.375 G IVPB
3.3750 g | Freq: Three times a day (TID) | INTRAVENOUS | Status: DC
  Administered 2016-04-20 – 2016-04-21 (×4): 3.375 g via INTRAVENOUS
  Filled 2016-04-19 (×4): qty 50

## 2016-04-19 MED ORDER — CLOPIDOGREL BISULFATE 75 MG PO TABS
75.0000 mg | ORAL_TABLET | Freq: Every day | ORAL | Status: DC
  Administered 2016-04-20 – 2016-04-28 (×9): 75 mg via ORAL
  Filled 2016-04-19 (×9): qty 1

## 2016-04-19 MED ORDER — PIPERACILLIN-TAZOBACTAM 3.375 G IVPB 30 MIN
3.3750 g | Freq: Once | INTRAVENOUS | Status: AC
Start: 2016-04-19 — End: 2016-04-19
  Administered 2016-04-19: 3.375 g via INTRAVENOUS
  Filled 2016-04-19: qty 50

## 2016-04-19 MED ORDER — DEXTROSE-NACL 5-0.45 % IV SOLN
INTRAVENOUS | Status: DC
  Administered 2016-04-19: 23:00:00 via INTRAVENOUS

## 2016-04-19 MED ORDER — PRO-STAT SUGAR FREE PO LIQD
30.0000 mL | Freq: Two times a day (BID) | ORAL | Status: DC
  Administered 2016-04-20: 30 mL
  Filled 2016-04-19: qty 30

## 2016-04-19 MED ORDER — ALBUTEROL (5 MG/ML) CONTINUOUS INHALATION SOLN
10.0000 mg/h | INHALATION_SOLUTION | Freq: Once | RESPIRATORY_TRACT | Status: AC
  Administered 2016-04-19: 10 mg/h via RESPIRATORY_TRACT
  Filled 2016-04-19: qty 20

## 2016-04-19 NOTE — ED Notes (Signed)
RT paged. ICU updated about delay in taking patient up.

## 2016-04-19 NOTE — ED Notes (Signed)
Attempted to obtain a rectal temp and had started pulling the patient's pants down. The patient grabbed her pants out of writer's hand and said "No oh no".

## 2016-04-19 NOTE — ED Triage Notes (Signed)
Per EMS- Patient was at the [facility] and was walking to the bathroom when she had increased SOB. Patient normally wears O2 4L/min via [place]. Sats are 81% with the O2 on. Rhonchi right side. Patient received 200 ml NS prior to arrival to the ED

## 2016-04-19 NOTE — ED Notes (Signed)
Patient refused Bipap until daughter Marzetta Board called.[name] Writer called, but no answer. Patient's husband called. No answer. Dr. Bland Span in the room afaterwards and patient agreed to put the Bipap on. Resp called.

## 2016-04-19 NOTE — ED Notes (Signed)
Resp called for ABG. Spoke with Tammy,

## 2016-04-19 NOTE — H&P (Signed)
PULMONARY / CRITICAL CARE MEDICINE   Name: Anita [name] MRN: [id number] DOB: 04/13/1946    ADMISSION DATE:  04/19/2016 CONSULTATION DATE:  04/19/16  REFERRING MD:  EDP / Dr. Fonnie Birkenhead  CHIEF COMPLAINT:  SOB  HISTORY OF PRESENT ILLNESS:   Patient is encephalopathic so I was not able to interview her. I discussed the case with the patient's husband and daughter. Chart review was done as well.  70 year old female, follows with Dr. Halford Chessman,  with past medical history of st IV breast cancer, chronic hypoxemic resp fx,  severe COPD, CHF systolic with EF 123XX123, hypertension who presents with shortness of breath. Patient states over last few weeks she's had progressively worsening shortness of breath and cough.  Subjective fevers. She has been trying to treat herself at home for this, but has had no improvement with her home inhalers over the last 24 hours. She's had worsening of her shortness of breath and has had generalized weakness.   Patient was at the [facility] and was walking to the bathroom when she had increased SOB, more than baseline. Patient normally wears O2 4L/min via [place]. Sats are 81% with the O2 on. She was brought to ED. At the emergency room, initial ABG showed pH of 7.29, PCO2 of 63, PO2 59 on 5 L nasal cannula. She was placed on BiPAP but her respiratory distress worsened. She was intubated. No issues post intubation. Currently sedated and fentanyl drip.  She is currently finishing her fluid resuscitation at 30 mils per kilogram.  Pt had a rpt ABG which showed pH of 7.18, PCO2 of 74, PO2 of 210. Ventilatory changes have been made.  PCCM was asked to admit.   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  PAST SURGICAL HISTORY: She  has a past surgical history that includes Colonoscopy, esophagogastroduodenoscopy (egd) and esophageal dilation (N/A, 10/13/2012) and left heart catheterization with coronary angiogram (N/A, 07/07/2013).  Allergies  Allergen Reactions  . Omnipaque [Iohexol] Hives and Other (See Comments)    Pt claims she developed hives after given contrast  . Benzene Rash    Current Facility-Administered Medications on File Prior to Encounter  Medication  . cetirizine (ZYRTEC) tablet 10 mg   Current Outpatient Prescriptions on File Prior to Encounter  Medication Sig  . albuterol (PROVENTIL HFA;VENTOLIN HFA) 108 (90 BASE) MCG/ACT inhaler Inhale 2 puffs into the lungs every 6 (six) hours as needed for wheezing or shortness of breath.  . Ascorbic Acid (VITAMIN C PO) Take 1 tablet by mouth daily.  [name] aspirin EC 81 MG EC tablet Take 1 tablet (81 mg total) by mouth daily.  [name] atorvastatin (LIPITOR) 40 MG tablet TAKE 1 TABLET EVERY DAY  . Cetirizine HCl 10 MG CAPS Take 1 capsule (10 mg total) by mouth daily.  . clopidogrel (PLAVIX) 75 MG tablet Take 1 tablet (75 mg total) by mouth daily.  [name] denosumab (XGEVA) 120 MG/1.7ML SOLN injection Inject 120 mg into the skin once. [facility] at [facility]  . fulvestrant (FASLODEX) 250 MG/5ML injection  Inject 250 mg into the muscle every 30 (thirty) days. Cancer center  . HYDROcodone-acetaminophen (NORCO/VICODIN) 5-325 MG tablet Take 1 tablet by mouth every 6 (six) hours as needed for moderate pain.  . hydrocortisone 2.5 % ointment Apply topically 2 (two) times daily.  . isosorbide mononitrate (IMDUR) 60 MG 24 hr tablet Take 1 tablet (60 mg total) by mouth daily.   . NON FORMULARY Place 3 L into the nose as needed (oxygen).   [name] omeprazole (PRILOSEC) 40 MG capsule Take 1 capsule (40 mg total) by mouth daily.  . potassium chloride SA (K-DUR,KLOR-CON) 20 MEQ tablet Take 2 tablets (40 mEq total) by mouth 2 (two) times daily.  [name] torsemide (DEMADEX) 20 MG tablet TAKE 2 TABLETS TWICE DAILY  . VITAMIN D, CHOLECALCIFEROL, PO Take 1,000 mg by mouth daily.   [name] acetaminophen (TYLENOL) 325 MG tablet Take 2 tablets (650 mg total) by mouth every 4 (four) hours as needed for headache or mild pain. (Patient not taking: Reported on 04/19/2016)  . albuterol (PROVENTIL) (2.5 MG/3ML) 0.083% nebulizer solution Inhale 3 mLs into the lungs every 6 (six) hours as needed for shortness of breath or wheezing.  . bag balm OINT ointment Apply 1 g topically as needed for dry skin or irritation.  . clotrimazole (LOTRIMIN) 1 % cream Apply 1 application topically daily. (Patient not taking: Reported on 04/19/2016)  . fluticasone (FLONASE) 50 MCG/ACT nasal spray Place 2 sprays into both nostrils daily. (Patient taking differently: Place 2 sprays into both nostrils daily as needed for allergies. )  . HYDROmorphone (DILAUDID) 2 MG tablet Take 1 tablet (2 mg total) by mouth every 4 (four) hours as needed for severe pain. (Patient not taking: Reported on 04/19/2016)  . metoprolol tartrate (LOPRESSOR) 25 MG tablet Take 1 tablet (25 mg total) by mouth 2 (two) times daily. (Patient not taking: Reported on 04/19/2016)  . mirtazapine (REMERON) 15 MG tablet Take 1 tablet (15 mg total) by mouth at bedtime. (Patient not taking: Reported on 04/19/2016)  . nitroGLYCERIN (NITROSTAT) 0.4 MG SL tablet Place 1 tablet (0.4 mg total) under the tongue every 5 (five) minutes as needed for chest pain.  . palbociclib (IBRANCE) 100 MG capsule Take 1 capsule (100 mg total) by mouth daily with breakfast. Take whole with food. (Patient not taking: Reported on 03/23/2016)    FAMILY HISTORY:  Her indicated that her mother is  deceased. She indicated that her father is deceased. She indicated that her maternal grandmother is deceased. She indicated that her maternal grandfather is deceased. She indicated that her paternal grandmother is deceased. She indicated that her paternal grandfather is deceased.    SOCIAL HISTORY: She  reports that she quit smoking about 3 years ago. Her smoking use included Cigarettes. She started smoking about 54 years ago. She has a 100.00 pack-year smoking history. She has never used smokeless tobacco. She reports that she does not drink alcohol or use drugs.  REVIEW OF SYSTEMS:   As mentioned. Progressive worsening dyspnea. Subjective fevers per daughter. Chronic cough, productive of white sputum. Denies chest pain. Chronic orthopnea. No abdominal issues per daughter. Chronic swelling of the extremities. 14 point review systems was done and everything else was done.  SUBJECTIVE:  Sedated. Intubated. Comfortable.  VITAL SIGNS: BP (!) 120/44   Pulse 107   Temp 98.9 F (37.2 C) (Oral)   Resp 14   Ht 5\' 2"  (1.575 m)   Wt 99.8 kg (220 lb)   SpO2 92%   BMI 40.24  kg/m   HEMODYNAMICS:    VENTILATOR SETTINGS: Vent Mode: PRVC FiO2 (%):  [60 %-100 %] 60 % Set Rate:  [14 bmp-20 bmp] 20 bmp Vt Set:  [400 mL] 400 mL PEEP:  [5 cmH20] 5 cmH20 Plateau Pressure:  [27 cmH20] 27 cmH20  INTAKE / OUTPUT: I/O last 3 completed shifts: In: 1050 [IV Piggyback:1050] Out: -   PHYSICAL EXAMINATION: General:  Sedated, intubated, comfortable. Not in distress. Neuro:  CN grossly intact. (-) lateralizing signs noted.  HEENT:  PERLA, (-) NVD, ETT in place. (-) obvious thrush seen.  Cardiovascular:  Good s1/s2. (-) s3/m/r/g Lungs:  Good ae. Rhonchi at the R base. (-) wheezing/crackles. (-) accesory muscle use.  Abdomen:  (+) BS, soft, NT, obese. (-) masses/tenderness Musculoskeletal:  Gr 2 edema. Her LE were covered with elastic bandages. Pulses were strong. Good capillary refill noted. Warm  extremities. (-) ashe.  Skin:  Warm and dry. (-) rash.   LABS:  BMET  Recent Labs Lab 04/19/16 1617  NA 141  K 3.8  CL 104  CO2 29  BUN 40*  CREATININE 1.65*  GLUCOSE 176*    Electrolytes  Recent Labs Lab 04/19/16 1617  CALCIUM 9.2    CBC  Recent Labs Lab 04/19/16 1617  WBC 9.0  HGB 7.8*  HCT 25.7*  PLT 213    Coag's No results for input(s): APTT, INR in the last 168 hours.  Sepsis Markers  Recent Labs Lab 04/19/16 1655 04/19/16 2014  LATICACIDVEN 1.51 1.14    ABG  Recent Labs Lab 04/19/16 1618 04/19/16 2000  PHART 7.292* 7.180*  PCO2ART 63.1* 73.7*  PO2ART 58.9* 210*    Liver Enzymes  Recent Labs Lab 04/19/16 1617  AST 42*  ALT 32  ALKPHOS 133*  BILITOT 0.7  ALBUMIN 2.8*    Cardiac Enzymes  Recent Labs Lab 04/19/16 1617  TROPONINI <0.03    Glucose No results for input(s): GLUCAP in the last 168 hours.  Imaging Dg Chest Portable 1 View  Result Date: 04/19/2016 CLINICAL DATA:  Status post intubation EXAM: PORTABLE CHEST 1 VIEW COMPARISON:  Chest radiograph dated 04/19/2016 at 1518 hour. CT chest dated age [age]. FINDINGS: Endotracheal tube terminates 3 cm above the carina. Increased interstitial markings, favoring mild interstitial edema. Mild patchy right lower lobe opacity, atelectasis versus pneumonia. Possible small right pleural effusion. No pneumothorax. Known pulmonary nodules/metastases are not radiographically evident. Cardiomegaly. Enteric tube courses into the stomach. IMPRESSION: Endotracheal tube terminates 3 cm above the carina. Suspected mild interstitial edema with possible small right pleural effusion. Mild patchy right lower lobe opacity, atelectasis versus pneumonia. Electronically Signed   By: Julian Hy M.D.   On: 04/19/2016 19:46   Dg Chest Port 1 View  Result Date: 04/19/2016 CLINICAL DATA:  Short of breath. History of metastatic breast cancer and COPD EXAM: PORTABLE CHEST 1 VIEW COMPARISON:   02/01/2016.  Chest CT 04/09/2016 FINDINGS: Cardiac enlargement. Interval development of diffuse bilateral airspace disease since the prior chest x-ray. Lungs were also clear on the recent chest CT. Findings suggest congestive heart failure with mild edema. Right lower lobe airspace disease likely represents atelectasis but could represent pneumonia. No pleural effusion. No known metastatic disease to bone. IMPRESSION: Interval development of bilateral airspace disease, most likely congestive heart failure with edema Right lower lobe atelectasis/ pneumonia Electronically Signed   By: Franchot Gallo M.D.   On: 04/19/2016 16:55     STUDIES:  CXR 8/24 inc int markings. RLL atelectasis/PNA  CULTURES: MRSA 8/24 >  Sputum 8/24 > Blood culture 8/24 >  ANTIBIOTICS: Zosyn 8/24 > Vanc 8/24 >  SIGNIFICANT EVENTS: 8/24 admit for AoC hypoxemic/hypercapneic resp fx. Intubated after failing bipap  LINES/TUBES: ETT 8/24 >   DISCUSSION: 70 year old female,with significant comorbidities including stage IV breast cancer, severe COPD, systolic CHF with EF 123XX123, chronic hypoxemic respiratory failure on 4 L O2, admitted for acute on chronic dyspnea, failed BiPAP, subsequently intubated.  ASSESSMENT / PLAN:  PULMONARY A: Acute on chronic hypoxemic respiratory failure secondary to the following:  1. Severe exacerbation of her severe COPD/chronic bronchitis. FEV1  0.57 or 27% (07/2015) 2. Possible healthcare associated pneumonia. No recent hospitalization but patient has been getting transfusions  frequently at the office and emergency room. Chest x-ray with possible right lower lobe infiltrate. 3. Acute CHF exacerbation. Last known EF 25-30%. Patient also with CAD. 4. Patient known to have a 5 mm right upper lung nodule, 5 mm left upper lung nodule, associated with her breast cancer.  These are stable based on chest CT scan in July/2017. 5. Most likely has obstructive sleep apnea. 6. OHS P:   Continue  ventilatory support. Repeat ABG in an hour. Last ABG showed hypercapnia but the rate has been adjusted. Continue Zosyn and vancomycin for now pending cultures. Start Solu-Medrol. Start nebulizer treatment with Pulmicort and Duoneb.  Check pro calcitonin. Finish off resuscitation with 30 mils per kilogram. Repeat sepsis assessment completed. Currently,she is adequately volume resuscitated once for 30 mils per kilogram is done. Heart rate is better. Blood pressure is better. MAPis acceptable. Pulses are strong.Good capillary refill. Depending on how her chest x-ray and physical exam are in the morning, she may end up needing diuresis. I suspect, she might have both COPD and CHF exacerbation with possible HCAP.   CARDIOVASCULAR A:  Chronic congestive heart failure, systolic, EF 123XX123. Concern for acute exacerbation. CAD HTN Dyslipidemia P:  Patient is adequately volume resuscitated. May end up needing diuresis in the morning. Trend troponin.  Holding off on Metoprolol and Imdur for now. Cont home meds - Lipitor   RENAL A:   AKI 2/2 hypovolemia CKD st 3B P:   Finish off with 30 mls/kg volume resuscitation  GASTROINTESTINAL A:   GERD P:   PPI Start TF in am  HEMATOLOGIC A:   Metastatic breast CA. Known to have 5 mm right upper lung nodule, 5 mm left upper lung nodule, multiple lytic lesions in spine/sternum/shoulder. Chest ct scan in 02/2016 > stable mets St III Laryngeal CA Anemia P:  Transfuse for Hb < 7 Holding off on chemo drugs (she gets 2 shots monthly)  INFECTIOUS A:   Possible HCAP in RLL P:   Panculture.  Check PCT > deescalate accordingly.  Cont vanc and zosyn for now.   ENDOCRINE A:   No issues P:   CBG q 4 while NPO Sliding scale  NEUROLOGIC A:   Sedation issues Depression Chronic pain P:   RASS goal: 0 - -1 Fentanyl drip Versed pushes Resume remeron   FAMILY  - Updates: I extensively discussed the case with the patient's husband and  patient's daughter. Patient remains a full code.  - Inter-disciplinary family meet or Palliative Care meeting due by:  August 31  I spent 45 minutes of critical care time with this patient today.  Monica Becton, MD Pulmonary and [place] Pager: [phone] After 3 pm or if no response, call [phone]  04/19/2016, 8:45 PM

## 2016-04-19 NOTE — Telephone Encounter (Signed)
Checked on this and we need to order a Barium Swallow test first to move forward.  Sending to PCP so she can order this so we can get this scheduled. Anita [name], [name], [place]

## 2016-04-19 NOTE — Progress Notes (Signed)
Pharmacy Antibiotic Note  Anita [name] is a 70 y.o. female admitted on 04/19/2016 with sepsis.  Pharmacy has been consulted for Vancomycin and Zosyn dosing.  Plan: Vancomycin 1gm IV x1 in ED then 1250mg  IV Q24h with Vanco trough goal level of 15-20 mcg/ml  Zosyn 3.375 Gm IV Q8h (4 hours infusion) Will f/u Scr and adjust dose as needed Will check Vanco trough level at steady state Will f/u Cx  Height: 5\' 2"  (157.5 cm) Weight: 220 lb (99.8 kg) IBW/kg (Calculated) : 50.1  Temp (24hrs), Avg:98.9 F (37.2 C), Min:98.9 F (37.2 C), Max:98.9 F (37.2 C)   Recent Labs Lab 04/19/16 1617 04/19/16 1655  WBC 9.0  --   CREATININE 1.65*  --   LATICACIDVEN  --  1.51    Estimated Creatinine Clearance: 36.1 mL/min (by C-G formula based on SCr of 1.65 mg/dL).    Allergies  Allergen Reactions  . Omnipaque [Iohexol] Hives and Other (See Comments)    Pt claims she developed hives after given contrast  . Benzene Rash    Antimicrobials this admission: 8/24  Vanco >>  8/24   ZEI >>   Dose adjustments this admission:   Microbiology results: 8/24 BCx: 8/24  UCx:   Thank you for allowing pharmacy to be a part of this patient's care.  Garnet Sierras 04/19/2016 5:35 PM

## 2016-04-19 NOTE — Progress Notes (Signed)
[facility] Progress Note Patient Name: Anita [name] DOB: 31-Dec-1945 MRN: [id number]   Date of Service  04/19/2016  HPI/Events of Note  Hypotension with MAP of 49.  Had received 2 liters of IVF in ED.  On no pressors but started on fentanyl for sedation of vent.    Also oliguric but has no foley on vent/sedated.  eICU Interventions  Plan: 500 cc bolus of NS for BP support Foley to monitor UOP in the setting of patient with hypotension/vented/sedated.     Intervention Category Intermediate Interventions: Oliguria - evaluation and management;Hypotension - evaluation and management  DETERDING,ELIZABETH 04/19/2016, 11:41 PM

## 2016-04-19 NOTE — ED Notes (Signed)
Bed: WA24 Expected date:  Expected time:  Means of arrival:  Comments: EMS-weakness 

## 2016-04-19 NOTE — Progress Notes (Signed)
[facility] Progress Note Patient Name: Anita [name] DOB: 1946-06-01 MRN: [id number]   Date of Service  04/19/2016  HPI/Events of Note  ABG on 60%/PRVC 20/TV 400/P 5 = 7.26/58/106/25.  eICU Interventions  Will order: 1. Increase PRVC rate to 24. 2. ABG at 12:30 AM.     Intervention Category Major Interventions: Acid-Base disturbance - evaluation and management;Respiratory failure - evaluation and management  Yesmin Mutch Cornelia Copa 04/19/2016, 10:59 PM

## 2016-04-19 NOTE — ED Provider Notes (Addendum)
[place] DEPT Provider Note   CSN: [id number] Arrival date & time: 04/19/16  1439     History   Chief Complaint Chief Complaint  Patient presents with  . Weakness  . Shortness of Breath    HPI Anita [name] is a 70 y.o. female.  HPI   70 year old female with past medical history of breast cancer, chronic respiratory failure, COPD, hypertension who presents with shortness of breath. Patient states over last few weeks. She's had progressively worsening shortness of breath and cough. She denies any fevers. She has been trying to treat herself at home for this, but has had no improvement with her home inhalers over the last 24 hours. She's had worsening of her shortness of breath and has had generalized weakness.. She has been unable to walk around her house to this weakness. Denies any chest pain or shortness of breath as constant but worse with exertion. Denies any alleviating or aggravating factors  Past Medical History:  Diagnosis Date  . Anemia    a. Adm 09/2012 with melena, Hgb 5.8 -> transfused. EGD/colonoscopy unrevealing.  . Anemia of chronic disease   . Breast cancer ([place])    a. Mets to bone. ER 100%/ PR 0%/Her2 neu negative.  [name] CAD (coronary artery disease)    a. 08/2012 NSTEMI: Med Rx; b. 10/2012 MV: EF 49%, old inf infarct;  c. 02/2013 NSTEMI-> Med Rx; d. 06/2013 Cath: LM nl, LAD 40p, 80-85p/m, D1 nl, LCX 20p, OM1 nl, RCA 100d w/ L->R collats, EF 30-35%, basal to mid inf AK, ant HK.  . Chronic diastolic CHF (congestive heart failure) ([place])    a. 01/2014 Echo: EF 55%, triv PR.  [name] Chronic respiratory failure ([place])    a. On O2 qhs and w/ exertion.  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 CKD (chronic kidney disease), stage IV ([place])   . COPD (chronic obstructive pulmonary disease) ([place])    a. Uses Home O2 w/ exertion.  . Dementia    very mild  . Depression   . Diabetes mellitus without complication ([place])   . Fibroid   . History  of tobacco abuse    a. Quit 2014.  [name] Hyperlipidemia 02/11/2013  . Lesion of vocal cord    a. CT 05/2013 concerning for tumor.  . Morbid obesity ([place])   . Radiation 02/02/14-02/24/14   left and right femur 30 gray  . Ulcers of both lower legs [facility])     Patient Active Problem List   Diagnosis Date Noted  . Hypotension 04/06/2016  . Cough 03/08/2016  . Cataracts, bilateral 03/08/2016  . 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 Chronic combined systolic and diastolic congestive heart failure ([place]) 02/09/2016  . Lytic lesion of bone on x-ray 02/09/2016  . Shortness of breath 02/09/2016  . NSTEMI (non-ST elevated myocardial infarction) ([place])   . Acute on chronic congestive heart failure ([place])   . COPD exacerbation ([place]) 12/01/2015  . Erythema of lower extremity 12/01/2015  . CAD in native artery   . Venous stasis ulcer ([place])   . COLD (chronic obstructive lung disease) ([place])   . Venous stasis of lower extremity 12/05/2014  . 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 Hyperlipidemia 02/11/2013  . Breast cancer metastasized to bone ([place]) 10/14/2012  . Chronic respiratory failure ([place]) 10/06/2012  . Chronic diastolic heart failure ([place]) 10/06/2012  . CAD (coronary artery disease) 10/06/2012  . CKD (chronic kidney disease), stage IV ([place]) 10/06/2012  .  Anemia of chronic disease 10/06/2012  . Breast mass, right 10/06/2012  . History of tobacco abuse 10/06/2012  . COPD (chronic obstructive pulmonary disease) ([place]) 09/27/2012  . DM2 (diabetes mellitus, type 2) ([place]) 09/26/2012    Past Surgical History:  Procedure Laterality Date  . COLONOSCOPY, ESOPHAGOGASTRODUODENOSCOPY (EGD) AND ESOPHAGEAL DILATION N/A 10/13/2012   Procedure: colonoscopy and egd;  Surgeon: Wonda Horner, MD;  Location: [name];  Service: Endoscopy;  Laterality: N/A;  . LEFT HEART CATHETERIZATION WITH CORONARY ANGIOGRAM N/A 07/07/2013   Procedure: LEFT HEART CATHETERIZATION WITH CORONARY ANGIOGRAM;  Surgeon: Peter M Martinique, MD;   Location: [facility] CATH LAB;  Service: Cardiovascular;  Laterality: N/A;    OB History    Gravida Para Term Preterm AB Living   _0 SAB TAB Ectopic Multiple Live Births                   Home Medications    Prior to Admission medications   Medication Sig Start Date End Date Taking? Authorizing Provider  albuterol (PROVENTIL HFA;VENTOLIN HFA) 108 (90 BASE) MCG/ACT inhaler Inhale 2 puffs into the lungs every 6 (six) hours as needed for wheezing or shortness of breath. 12/08/14  Yes Chesley Mires, MD  Ascorbic Acid (VITAMIN C PO) Take 1 tablet by mouth daily.   Yes Historical Provider, MD  aspirin EC 81 MG EC tablet Take 1 tablet (81 mg total) by mouth daily. 10/02/12  Yes Leone Haven, MD  atorvastatin (LIPITOR) 40 MG tablet TAKE 1 TABLET EVERY DAY 01/04/16  Yes Nicolette Bang, DO  Cetirizine HCl 10 MG CAPS Take 1 capsule (10 mg total) by mouth daily. 04/05/16  Yes Nicolette Bang, DO  clopidogrel (PLAVIX) 75 MG tablet Take 1 tablet (75 mg total) by mouth daily. 03/08/16  Yes Nicolette Bang, DO  denosumab (XGEVA) 120 MG/1.7ML SOLN injection Inject 120 mg into the skin once. Monthly at [facility]   Yes Historical Provider, MD  fulvestrant (FASLODEX) 250 MG/5ML injection Inject 250 mg into the muscle every 30 (thirty) days. Cancer center   Yes Historical Provider, MD  HYDROcodone-acetaminophen (NORCO/VICODIN) 5-325 MG tablet Take 1 tablet by mouth every 6 (six) hours as needed for moderate pain. 02/20/16  Yes Truitt Merle, MD  hydrocortisone 2.5 % ointment Apply topically 2 (two) times daily. 04/05/16  Yes Nicolette Bang, DO  isosorbide mononitrate (IMDUR) 60 MG 24 hr tablet Take 1 tablet (60 mg total) by mouth daily. 12/01/15  Yes Nicolette Bang, DO  NON FORMULARY Place 3 L into the nose as needed (oxygen).    Yes Historical Provider, MD  omeprazole (PRILOSEC) 40 MG capsule Take 1 capsule (40 mg total) by mouth daily. 04/05/16  Yes Nicolette Bang,  DO  potassium chloride SA (K-DUR,KLOR-CON) 20 MEQ tablet Take 2 tablets (40 mEq total) by mouth 2 (two) times daily. 12/01/15  Yes Nicolette Bang, DO  torsemide (DEMADEX) 20 MG tablet TAKE 2 TABLETS TWICE DAILY 04/05/16  Yes Nicolette Bang, DO  VITAMIN D, CHOLECALCIFEROL, PO Take 1,000 mg by mouth daily.    Yes Historical Provider, MD  acetaminophen (TYLENOL) 325 MG tablet Take 2 tablets (650 mg total) by mouth every 4 (four) hours as needed for headache or mild pain. Patient not taking: Reported on 04/19/2016 02/03/16   Rogue Bussing, MD  albuterol (PROVENTIL) (2.5 MG/3ML) 0.083% nebulizer solution Inhale 3 mLs into the lungs every 6 (six) hours as  needed for shortness of breath or wheezing. 02/27/16   Historical Provider, MD  bag balm OINT ointment Apply 1 g topically as needed for dry skin or irritation. 12/03/14   Coral Spikes, DO  clotrimazole (LOTRIMIN) 1 % cream Apply 1 application topically daily. Patient not taking: Reported on 04/19/2016 09/12/15   Minus Breeding, MD  fluticasone [facility]) 50 MCG/ACT nasal spray Place 2 sprays into both nostrils daily. Patient taking differently: Place 2 sprays into both nostrils daily as needed for allergies.  03/08/16   Nicolette Bang, DO  HYDROmorphone (DILAUDID) 2 MG tablet Take 1 tablet (2 mg total) by mouth every 4 (four) hours as needed for severe pain. Patient not taking: Reported on 04/19/2016 02/03/16   Rogue Bussing, MD  metoprolol tartrate (LOPRESSOR) 25 MG tablet Take 1 tablet (25 mg total) by mouth 2 (two) times daily. Patient not taking: Reported on 04/19/2016 03/08/16   Nicolette Bang, DO  mirtazapine (REMERON) 15 MG tablet Take 1 tablet (15 mg total) by mouth at bedtime. Patient not taking: Reported on 04/19/2016 12/01/15   Nicolette Bang, DO  nitroGLYCERIN (NITROSTAT) 0.4 MG SL tablet Place 1 tablet (0.4 mg total) under the tongue every 5 (five) minutes as needed for chest pain. 03/08/16    Nicolette Bang, DO  palbociclib [facility]) 100 MG capsule Take 1 capsule (100 mg total) by mouth daily with breakfast. Take whole with food. Patient not taking: Reported on 03/23/2016 10/21/15   Truitt Merle, MD    Family History Family History  Problem Relation Age of Onset  . Cancer Mother     leukemia  . Hypertension Mother   . Diabetes Father   . Heart disease Father     passed away due to heart attack    Social History Social History  Substance Use Topics  . Smoking status: Former Smoker    Packs/day: 2.00    Years: 50.00    Types: Cigarettes    Start date: 08/27/1961    Quit date: 09/24/2012  . Smokeless tobacco: Never Used     Comment: Quit when COPD dx  . Alcohol use No     Allergies   Omnipaque [iohexol] and Benzene   Review of Systems Review of Systems  Constitutional: Positive for fatigue. Negative for chills and fever.  HENT: Negative for congestion, rhinorrhea and sore throat.   Eyes: Negative for visual disturbance.  Respiratory: Positive for cough, shortness of breath and wheezing.   Cardiovascular: Negative for chest pain and leg swelling.  Gastrointestinal: Negative for abdominal pain, diarrhea, nausea and vomiting.  Genitourinary: Negative for dysuria, flank pain, vaginal bleeding and vaginal discharge.  Musculoskeletal: Negative for neck pain.  Skin: Negative for rash.  Allergic/Immunologic: Negative for immunocompromised state.  Neurological: Positive for weakness. Negative for syncope and headaches.  Hematological: Does not bruise/bleed easily.  All other systems reviewed and are negative.    Physical Exam Updated Vital Signs BP (!) 120/44   Pulse 107   Temp 98.9 F (37.2 C) (Oral)   Resp 14   Ht 5' 2" (1.575 m)   Wt 220 lb (99.8 kg)   SpO2 100%   BMI 40.24 kg/m   Physical Exam  Constitutional: She is oriented to person, place, and time. She appears well-developed.  HENT:  Head: Normocephalic and atraumatic.  Eyes:  Conjunctivae are normal.  Neck: Neck supple.  Cardiovascular: Regular rhythm and normal heart sounds.  Tachycardia present.  Exam reveals no friction rub.  No murmur heard. Pulmonary/Chest: Accessory muscle usage present. Tachypnea noted. She is in respiratory distress. She has wheezes in the right upper field, the right middle field, the right lower field, the left upper field, the left middle field and the left lower field. She has rhonchi in the right lower field and the left lower field. She has no rales.  Abdominal: She exhibits no distension.  Musculoskeletal: She exhibits no edema.  Neurological: She is alert and oriented to person, place, and time. She exhibits normal muscle tone.  Skin: Skin is warm. Capillary refill takes less than 2 seconds.  Psychiatric: She has a normal mood and affect.  Nursing note and vitals reviewed.    ED Treatments / Results  Labs (all labs ordered are listed, but only abnormal results are displayed) Labs Reviewed  CBC WITH DIFFERENTIAL/PLATELET - Abnormal; Notable for the following:       Result Value   RBC 2.84 (*)    Hemoglobin 7.8 (*)    HCT 25.7 (*)    RDW 20.1 (*)    Lymphs Abs 0.5 (*)    All other components within normal limits  COMPREHENSIVE METABOLIC PANEL - Abnormal; Notable for the following:    Glucose, Bld 176 (*)    BUN 40 (*)    Creatinine, Ser 1.65 (*)    Total Protein 5.8 (*)    Albumin 2.8 (*)    AST 42 (*)    Alkaline Phosphatase 133 (*)    GFR calc non Af Amer 31 (*)    GFR calc Af Amer 36 (*)    All other components within normal limits  BLOOD GAS, ARTERIAL - Abnormal; Notable for the following:    pH, Arterial 7.292 (*)    pCO2 arterial 63.1 (*)    pO2, Arterial 58.9 (*)    Bicarbonate 29.6 (*)    Acid-Base Excess 2.6 (*)    All other components within normal limits  CULTURE, BLOOD (ROUTINE X 2)  CULTURE, BLOOD (ROUTINE X 2)  URINE CULTURE  BRAIN NATRIURETIC PEPTIDE  TROPONIN I  URINALYSIS, ROUTINE W REFLEX  MICROSCOPIC (NOT AT [facility])  BLOOD GAS, ARTERIAL  I-STAT CG4 LACTIC ACID, ED  I-STAT CG4 LACTIC ACID, ED    EKG  EKG Interpretation  Date/Time:  Thursday April 19 2016 15:00:39 EDT Ventricular Rate:  108 PR Interval:    QRS Duration: 112 QT Interval:  339 QTC Calculation: 455 R Axis:   92 Text Interpretation:  Sinus tachycardia Borderline intraventricular conduction delay Since last EKG 02/09/16 SINCE LAST TRACING HEART RATE HAS INCREASED Confirmed by Ellender Hose MD, Lysbeth Galas [phone]) on 04/19/2016 6:00:25 PM       Radiology Dg Chest Portable 1 View  Result Date: 04/19/2016 CLINICAL DATA:  Status post intubation EXAM: PORTABLE CHEST 1 VIEW COMPARISON:  Chest radiograph dated 04/19/2016 at 1518 hour. CT chest dated age [age]. FINDINGS: Endotracheal tube terminates 3 cm above the carina. Increased interstitial markings, favoring mild interstitial edema. Mild patchy right lower lobe opacity, atelectasis versus pneumonia. Possible small right pleural effusion. No pneumothorax. Known pulmonary nodules/metastases are not radiographically evident. Cardiomegaly. Enteric tube courses into the stomach. IMPRESSION: Endotracheal tube terminates 3 cm above the carina. Suspected mild interstitial edema with possible small right pleural effusion. Mild patchy right lower lobe opacity, atelectasis versus pneumonia. Electronically Signed   By: Julian Hy M.D.   On: 04/19/2016 19:46   Dg Chest Port 1 View  Result Date: 04/19/2016 CLINICAL DATA:  Short of breath. History of metastatic  breast cancer and COPD EXAM: PORTABLE CHEST 1 VIEW COMPARISON:  02/01/2016.  Chest CT 04/09/2016 FINDINGS: Cardiac enlargement. Interval development of diffuse bilateral airspace disease since the prior chest x-ray. Lungs were also clear on the recent chest CT. Findings suggest congestive heart failure with mild edema. Right lower lobe airspace disease likely represents atelectasis but could represent pneumonia. No pleural  effusion. No known metastatic disease to bone. IMPRESSION: Interval development of bilateral airspace disease, most likely congestive heart failure with edema Right lower lobe atelectasis/ pneumonia Electronically Signed   By: Franchot Gallo M.D.   On: 04/19/2016 16:55    Procedures .Intubation Date/Time: 04/19/2016 7:16 PM Performed by: Duffy Bruce Authorized by: Duffy Bruce   Consent:    Consent obtained:  Verbal and emergent situation   Consent given by:  Patient   Risks discussed:  Aspiration, death, bleeding and hypoxia   Alternatives discussed:  Alternative treatment and delayed treatment Pre-procedure details:    Patient status:  Altered mental status   Mallampati score:  III   Pretreatment medications:  None   Paralytics:  Succinylcholine Procedure details:    Preoxygenation:  Nonrebreather mask   CPR in progress: no     Intubation method:  Oral   Oral intubation technique:  Video-assisted   Laryngoscope blade:  Mac 3   Tube size (mm):  7.5   Tube type:  Cuffed   Number of attempts:  1   Ventilation between attempts: no     Cricoid pressure: yes     Tube visualized through cords: yes   Placement assessment:    ETT to lip:  22   Tube secured with:  ETT holder   Breath sounds:  Equal   Placement verification: chest rise, condensation, CXR verification, direct visualization, equal breath sounds, esophageal detector, ETCO2 detector and tube exhalation     CXR findings:  ETT in proper place Post-procedure details:    Patient tolerance of procedure:  Tolerated well, no immediate complications .Critical Care Performed by: Duffy Bruce Authorized by: Duffy Bruce   Critical care provider statement:    Critical care time (minutes):  75   Critical care time was exclusive of:  Separately billable procedures and treating other patients   Critical care was necessary to treat or prevent imminent or life-threatening deterioration of the following conditions:   Cardiac failure, respiratory failure, sepsis and shock   Critical care was time spent personally by me on the following activities:  Development of treatment plan with patient or surrogate, discussions with consultants, discussions with primary provider, evaluation of patient's response to treatment, blood draw for specimens, examination of patient, ordering and performing treatments and interventions, ordering and review of laboratory studies, ordering and review of radiographic studies, pulse oximetry, review of old charts, re-evaluation of patient's condition, ventilator management and obtaining history from patient or surrogate   I assumed direction of critical care for this patient from another provider in my specialty: no     (including critical care time)  Angiocath insertion Performed by: Evonnie Pat Consent: Verbal consent obtained. Risks and benefits: risks, benefits and alternatives were discussed Time out: Immediately prior to procedure a "time out" was called to verify the correct patient, procedure, equipment, support staff and site/side marked as required. Preparation: Patient was prepped and draped in the usual sterile fashion. Vein Location: 20 Ultrasound Guided Gauge: 20 Normal blood return and flush without difficulty Patient tolerance: Patient tolerated the procedure well with no immediate complications.     Medications  Ordered in ED Medications  vancomycin (VANCOCIN) 1,250 mg in sodium chloride 0.9 % 250 mL IVPB (not administered)  piperacillin-tazobactam (ZOSYN) IVPB 3.375 g (not administered)  acetaminophen (TYLENOL) tablet 1,000 mg (not administered)  fentaNYL (SUBLIMAZE) 2,500 mcg in sodium chloride 0.9 % 250 mL (10 mcg/mL) infusion (50 mcg/hr Intravenous New Bag/Given 04/19/16 1948)  fentaNYL (SUBLIMAZE) bolus via infusion 25 mcg (not administered)  midazolam (VERSED) injection 1 mg (not administered)  midazolam (VERSED) injection 1 mg (not administered)    methylPREDNISolone sodium succinate (SOLU-MEDROL) 125 mg/2 mL injection 125 mg (125 mg Intravenous Given 04/19/16 1625)  albuterol (PROVENTIL,VENTOLIN) solution continuous neb (10 mg/hr Nebulization Given 04/19/16 1603)  ipratropium (ATROVENT) nebulizer solution 1 mg (1 mg Nebulization Given 04/19/16 1603)  sodium chloride 0.9 % bolus 1,000 mL (0 mLs Intravenous Stopped 04/19/16 1634)  piperacillin-tazobactam (ZOSYN) IVPB 3.375 g (0 g Intravenous Stopped 04/19/16 1835)  vancomycin (VANCOCIN) IVPB 1000 mg/200 mL premix (0 mg Intravenous Stopped 04/19/16 1912)  magnesium sulfate IVPB 2 g 50 mL (0 g Intravenous Stopped 04/19/16 1912)  sodium chloride 0.9 % bolus 1,000 mL (0 mLs Intravenous Stopped 04/19/16 1912)  ketamine 100 mg in normal saline 10 mL (30m/mL) syringe (100 mg Intravenous Given 04/19/16 1911)  succinylcholine (ANECTINE) injection 125 mg (125 mg Intravenous Given 04/19/16 1857)  fentaNYL (SUBLIMAZE) injection 50 mcg (50 mcg Intravenous Given 04/19/16 1924)     Initial Impression / Assessment and Plan / ED Course  I have reviewed the triage vital signs and the nursing notes.  Pertinent labs & imaging results that were available during my care of the patient were reviewed by me and considered in my medical decision making (see chart for details).  Clinical Course  70year old female with past medical history of breast cancer and severe COPD presents with acute on chronic respiratory failure on arrival, patient in marked respiratory distress with tachypnea and diffuse wheezing and rales, right greater than left. She is hypoxic to 81% on her baseline 4 L nasal cannula. She has baseline slurred speech, but is alert and able to converse at this time. Will trial continuous albuterol, steroids, magnesium, and obtain stat chest x-ray. Will give 1L NS for gentle fluids given borderline Hypotension, although she has EF 25-30%, some hesitant to fluid overload. I do feel that at this time, 30 mL/kg be  contraindicated. At this time, pt protecting her airway but has tenuous respiratory status. I discussed this with pt - she would desire intubation should it be required, but would prefer BIPAP trial if indicated. Pt FULL CODE per discussion with her. She demonstrates understanding of her illness, prognosis, as well as indications for intubation and side effects of holding intubation. She is AOx4.  X-ray shows interstitial edema as well as pneumonia. Labwork has white count of 9.0, but she does have left shift. Blood gas shows acute on chronic hypercapnic respiratory failure. BiPAP started. CMP had baseline. Will continue to try BiPAP with low threshold for intubation.  Patient has become increasingly agitated while on BiPAP. She is no longer tolerating this. I had a long discussion with the patient both prior to and after BiPAP. She states that she would desire intubation at this time. I have reviewed the patient's medical records extensively and there is no mention of palliative or DO NOT RESUSCITATE care on her PCP or oncologist notes. She was full code during her last medical admission. I discussed risks and benefits of continued BiPAP versus intubation with the patient. Will elect for intubation.  Given persistent drowsiness, respiratory failure, and intolerance of BiPAP. Otherwise, given left shift will continue fluids. Will give total of 30 mL/kg will give this slowly given patient's history of heart failure. She does have some mild hypotension, which may be cardiogenic versus secondary to shock. Lactate is normal, however.  Patient intubated as above. She tolerated the procedure well. Will admit to the ICU. I attempted to contact patient's husband and daughter with no success. At this time, an additional 2L NS have been given (1L not documented as it was hung during active resuscitation, RN aware) for a total of 3L, or 30 cc/kg.  ADDENDUM: Pt's daughter is at bedside. She states that pt may have wishes  do not intubate desire in past but states that pt would want "everything done." She admits this is in contrast to her DNI. She admits pt may ask for intubation in event of respiratory failure. I updated her on pt's wishes, progression of ED course, and current status. All questions answered. She is in agreement with treatment plan. Final Clinical Impressions(s) / ED Diagnoses   Final diagnoses:  SOB (shortness of breath)  Acute on chronic respiratory failure with hypercapnia (HCC)  Left shift  Severe sepsis (HCC)        Duffy Bruce, MD 04/20/16 [phone]

## 2016-04-19 NOTE — ED Notes (Signed)
Pt. Refuses to have temp taken rectal. Grabbed pants & stated NO.

## 2016-04-19 NOTE — ED Notes (Signed)
Patient pulled bipap mask off and turned iv fluids off. EDP aware. Patient stated she was trying to get someone's attention

## 2016-04-20 ENCOUNTER — Encounter: Payer: Self-pay | Admitting: Cardiology

## 2016-04-20 ENCOUNTER — Inpatient Hospital Stay (HOSPITAL_COMMUNITY): Payer: Commercial Managed Care - HMO

## 2016-04-20 ENCOUNTER — Telehealth: Payer: Self-pay | Admitting: *Deleted

## 2016-04-20 DIAGNOSIS — J441 Chronic obstructive pulmonary disease with (acute) exacerbation: Secondary | ICD-10-CM

## 2016-04-20 DIAGNOSIS — J449 Chronic obstructive pulmonary disease, unspecified: Secondary | ICD-10-CM

## 2016-04-20 DIAGNOSIS — E119 Type 2 diabetes mellitus without complications: Secondary | ICD-10-CM

## 2016-04-20 DIAGNOSIS — C7800 Secondary malignant neoplasm of unspecified lung: Secondary | ICD-10-CM

## 2016-04-20 DIAGNOSIS — N189 Chronic kidney disease, unspecified: Secondary | ICD-10-CM

## 2016-04-20 DIAGNOSIS — D631 Anemia in chronic kidney disease: Secondary | ICD-10-CM

## 2016-04-20 DIAGNOSIS — R0600 Dyspnea, unspecified: Secondary | ICD-10-CM

## 2016-04-20 DIAGNOSIS — C50919 Malignant neoplasm of unspecified site of unspecified female breast: Secondary | ICD-10-CM

## 2016-04-20 DIAGNOSIS — C7951 Secondary malignant neoplasm of bone: Secondary | ICD-10-CM

## 2016-04-20 LAB — URINALYSIS, ROUTINE W REFLEX MICROSCOPIC
Bilirubin Urine: NEGATIVE
Glucose, UA: NEGATIVE mg/dL
Ketones, ur: NEGATIVE mg/dL
Nitrite: NEGATIVE
Protein, ur: NEGATIVE mg/dL
Specific Gravity, Urine: 1.011 (ref 1.005–1.030)
pH: 5.5 (ref 5.0–8.0)

## 2016-04-20 LAB — GLUCOSE, CAPILLARY
GLUCOSE-CAPILLARY: 209 mg/dL — AB (ref 65–99)
GLUCOSE-CAPILLARY: 220 mg/dL — AB (ref 65–99)
GLUCOSE-CAPILLARY: 239 mg/dL — AB (ref 65–99)
Glucose-Capillary: 256 mg/dL — ABNORMAL HIGH (ref 65–99)
Glucose-Capillary: 275 mg/dL — ABNORMAL HIGH (ref 65–99)
Glucose-Capillary: 290 mg/dL — ABNORMAL HIGH (ref 65–99)
Glucose-Capillary: 299 mg/dL — ABNORMAL HIGH (ref 65–99)

## 2016-04-20 LAB — BLOOD GAS, ARTERIAL
ACID-BASE EXCESS: 1 mmol/L (ref 0.0–2.0)
Acid-base deficit: 0.2 mmol/L (ref 0.0–2.0)
BICARBONATE: 25.1 meq/L — AB (ref 20.0–24.0)
BICARBONATE: 24.3 meq/L — AB (ref 20.0–24.0)
Drawn by: 232811
Drawn by: 225631
FIO2: 50
FIO2: 50
LHR: 24 {breaths}/min
LHR: 24 {breaths}/min
MECHVT: 400 mL
O2 SAT: 91 %
O2 Saturation: 95.2 %
PATIENT TEMPERATURE: 36.8
PATIENT TEMPERATURE: 98.6
PCO2 ART: 39.7 mmHg (ref 35.0–45.0)
PEEP/CPAP: 5 cmH2O
PEEP/CPAP: 5 cmH2O
PH ART: 7.416 (ref 7.350–7.450)
PO2 ART: 60.7 mmHg — AB (ref 80.0–100.0)
PO2 ART: 77.4 mmHg — AB (ref 80.0–100.0)
TCO2: 24.1 mmol/L (ref 0–100)
TCO2: 23.4 mmol/L (ref 0–100)
VT: 400 mL
pCO2 arterial: 42.1 mmHg (ref 35.0–45.0)
pH, Arterial: 7.38 (ref 7.350–7.450)

## 2016-04-20 LAB — CBC
HEMATOCRIT: 23.6 % — AB (ref 36.0–46.0)
Hemoglobin: 7.4 g/dL — ABNORMAL LOW (ref 12.0–15.0)
MCH: 27.7 pg (ref 26.0–34.0)
MCHC: 31.4 g/dL (ref 30.0–36.0)
MCV: 88.4 fL (ref 78.0–100.0)
Platelets: 177 10*3/uL (ref 150–400)
RBC: 2.67 MIL/uL — ABNORMAL LOW (ref 3.87–5.11)
RDW: 19.6 % — AB (ref 11.5–15.5)
WBC: 9.2 10*3/uL (ref 4.0–10.5)

## 2016-04-20 LAB — BASIC METABOLIC PANEL
Anion gap: 8 (ref 5–15)
BUN: 36 mg/dL — ABNORMAL HIGH (ref 6–20)
CHLORIDE: 107 mmol/L (ref 101–111)
CO2: 25 mmol/L (ref 22–32)
Calcium: 8.3 mg/dL — ABNORMAL LOW (ref 8.9–10.3)
Creatinine, Ser: 1.46 mg/dL — ABNORMAL HIGH (ref 0.44–1.00)
GFR calc non Af Amer: 36 mL/min — ABNORMAL LOW (ref >60)
GFR, EST AFRICAN AMERICAN: 41 mL/min — AB (ref >60)
Glucose, Bld: 295 mg/dL — ABNORMAL HIGH (ref 65–99)
Potassium: 4 mmol/L (ref 3.5–5.1)
SODIUM: 140 mmol/L (ref 135–145)

## 2016-04-20 LAB — TROPONIN I: Troponin I: <0.03 ng/mL (ref <0.03)

## 2016-04-20 LAB — URINE MICROSCOPIC-ADD ON

## 2016-04-20 LAB — MRSA PCR SCREENING: MRSA BY PCR: POSITIVE — AB

## 2016-04-20 LAB — PROCALCITONIN: PROCALCITONIN: 1.82 ng/mL

## 2016-04-20 LAB — MAGNESIUM: Magnesium: 2.5 mg/dL — ABNORMAL HIGH (ref 1.7–2.4)

## 2016-04-20 LAB — PHOSPHORUS: Phosphorus: 4.7 mg/dL — ABNORMAL HIGH (ref 2.5–4.6)

## 2016-04-20 MED ORDER — ASPIRIN 81 MG PO CHEW
81.0000 mg | CHEWABLE_TABLET | Freq: Every day | ORAL | Status: AC
  Administered 2016-04-21: 81 mg
  Filled 2016-04-20: qty 1

## 2016-04-20 MED ORDER — SODIUM CHLORIDE 0.9 % IV BOLUS (SEPSIS)
500.0000 mL | Freq: Once | INTRAVENOUS | Status: AC
  Administered 2016-04-20: 500 mL via INTRAVENOUS

## 2016-04-20 MED ORDER — SODIUM CHLORIDE 0.9 % IV SOLN
INTRAVENOUS | Status: DC
  Administered 2016-04-20: 03:00:00 via INTRAVENOUS

## 2016-04-20 MED ORDER — ISOSORBIDE MONONITRATE ER 60 MG PO TB24
60.0000 mg | ORAL_TABLET | Freq: Every day | ORAL | Status: DC
  Administered 2016-04-20 – 2016-04-21 (×2): 60 mg via ORAL
  Filled 2016-04-20 (×2): qty 1

## 2016-04-20 MED ORDER — CHLORHEXIDINE GLUCONATE 0.12 % MT SOLN
15.0000 mL | Freq: Two times a day (BID) | OROMUCOSAL | Status: DC
  Administered 2016-04-21: 15 mL via OROMUCOSAL
  Filled 2016-04-20: qty 15

## 2016-04-20 MED ORDER — METOPROLOL TARTRATE 25 MG PO TABS
25.0000 mg | ORAL_TABLET | Freq: Two times a day (BID) | ORAL | Status: DC
  Administered 2016-04-20 – 2016-04-21 (×2): 25 mg via ORAL
  Filled 2016-04-20 (×3): qty 1

## 2016-04-20 MED ORDER — FENTANYL CITRATE (PF) 100 MCG/2ML IJ SOLN: 12.5000 ug | INTRAMUSCULAR | Status: DC | PRN

## 2016-04-20 MED ORDER — CETYLPYRIDINIUM CHLORIDE 0.05 % MT LIQD: 7.0000 mL | Freq: Two times a day (BID) | OROMUCOSAL | Status: DC

## 2016-04-20 MED ORDER — CHLORHEXIDINE GLUCONATE CLOTH 2 % EX PADS
6.0000 | MEDICATED_PAD | Freq: Every day | CUTANEOUS | Status: AC
  Administered 2016-04-21 – 2016-04-24 (×4): 6 via TOPICAL

## 2016-04-20 MED ORDER — MUPIROCIN 2 % EX OINT
1.0000 "application " | TOPICAL_OINTMENT | Freq: Two times a day (BID) | CUTANEOUS | Status: AC
  Administered 2016-04-20 – 2016-04-24 (×10): 1 via NASAL
  Filled 2016-04-20 (×3): qty 22

## 2016-04-20 MED ORDER — ASPIRIN 81 MG PO CHEW
CHEWABLE_TABLET | ORAL | Status: AC
  Administered 2016-04-20: 81 mg
  Filled 2016-04-20: qty 1

## 2016-04-20 MED ORDER — FUROSEMIDE 10 MG/ML IJ SOLN
40.0000 mg | Freq: Four times a day (QID) | INTRAMUSCULAR | Status: AC
  Administered 2016-04-20 (×2): 40 mg via INTRAVENOUS
  Filled 2016-04-20 (×2): qty 4

## 2016-04-20 MED ORDER — CHLORHEXIDINE GLUCONATE 0.12 % MT SOLN: 15.0000 mL | Freq: Two times a day (BID) | OROMUCOSAL | Status: DC

## 2016-04-20 MED ORDER — POTASSIUM CHLORIDE CRYS ER 20 MEQ PO TBCR
40.0000 meq | EXTENDED_RELEASE_TABLET | Freq: Once | ORAL | Status: AC
  Administered 2016-04-20: 40 meq via ORAL
  Filled 2016-04-20: qty 2

## 2016-04-20 NOTE — Telephone Encounter (Signed)
Patient is currently admitted to ICU at [facility] with respiratory failure.  Will need to address whether letter is still needed after she is discharged from hospital.

## 2016-04-20 NOTE — Telephone Encounter (Signed)
Received call from pt that was very hard to hear.  Pt's voice is a wisper & she wanted to let Dr Burr Medico know that she was in ICU at [facility].  Message given to RN & Dr Burr Medico.

## 2016-04-20 NOTE — Telephone Encounter (Signed)
VS please advise.  thanks 

## 2016-04-20 NOTE — Procedures (Signed)
Extubation Procedure Note  Patient Details:   Name: Anita [name] DOB: April 28, 1946 MRN: [id number]   Airway Documentation:  Airway 7.5 mm (Active)  Secured at (cm) 21 cm 04/20/2016  9:01 AM  Measured From Lips 04/20/2016  9:01 AM  [place] 04/20/2016  8:00 AM  Secured By [employer] 04/20/2016  9:01 AM  Tube Holder Repositioned Yes 04/20/2016  9:01 AM  Cuff Pressure (cm H2O) 24 cm H2O 04/20/2016 12:38 AM  Site Condition Dry 04/20/2016  8:00 AM    Evaluation  O2 sats: stable throughout Complications: No apparent complications Patient did tolerate procedure well. Bilateral Breath Sounds: Diminished   Yes  Johnette Abraham 04/20/2016, 10:22 AM

## 2016-04-20 NOTE — Progress Notes (Signed)
Inpatient Diabetes Program Recommendations  AACE/ADA: New Consensus Statement on Inpatient Glycemic Control (2015)  Target Ranges:  Prepandial:   less than 140 mg/dL      Peak postprandial:   less than 180 mg/dL (1-2 hours)      Critically ill patients:  140 - 180 mg/dL   Results for DOAN, DUNKER (MRN [id number]) as of 04/20/2016 09:29  Ref. Range 04/20/2016 00:05 04/20/2016 04:12 04/20/2016 07:27  Glucose-Capillary Latest Ref Range: 65 - 99 mg/dL 299 (H) 290 (H) 275 (H)    Admit with: SOB  History: DM, Breast Cancer, COPD, CHF, CKD  Home DM Meds: None- Diet Controlled  Current Insulin Orders: Novolog Sensitive Correction Scale/ SSI (0-9 units) Q4 hours      -Note patient received 125 mg Iv Solumedrol yesterday at 4:30pm.  Now getting Solumedrol 40 mg Q6 hours.  -Glucose levels >250 mg/dl since midnight.  -Currently Intubated and note that tube feeds may be initiated.      MD- Please consider starting ICU Glycemic Control Protocol for this patient.  Would start with Phase 2 IV Insulin since CBGs >250 mg/dl.      --Will follow patient during hospitalization--  Wyn Quaker RN, MSN, CDE Diabetes Coordinator Inpatient Glycemic Control Team Team Pager: [phone] (8a-5p)

## 2016-04-20 NOTE — Progress Notes (Signed)
Anita [name]   DOB:August 25, 1946   RU#:[id number]   [phone]  ONCOLOGY FOLLOW UP NOTE   Subjective: Pt is very well known to me, has been under my care for her metastatic breast cancer. She has been having productive cough for the past few months. She was admitted yesterday for quite sudden  onset dyspnea, and was intubated for respiratory failure. She was extubated today, feels better.   Objective:  Vitals:   04/20/16 2100 04/20/16 2124  BP:    Pulse: 85 95  Resp: (!) 31 (!) 21  Temp:  97.2 F (36.2 C)    Body mass index is 41.09 kg/m.  Intake/Output Summary (Last 24 hours) at 04/20/16 2243 Last data filed at 04/20/16 1700  Gross per 24 hour  Intake          2677.75 ml  Output             1550 ml  Net          1127.75 ml     Sclerae unicteric  Oropharynx clear  No peripheral adenopathy  Lungs clear -- no rales or rhonchi  Heart regular rate and rhythm  Abdomen benign  MSK no focal spinal tenderness, no peripheral edema  Neuro nonfocal    CBG (last 3)   Recent Labs  04/20/16 1140 04/20/16 1640 04/20/16 2033  GLUCAP 256* 239* 220*     Labs:  Lab Results  Component Value Date   WBC 9.2 04/20/2016   HGB 7.4 (L) 04/20/2016   HCT 23.6 (L) 04/20/2016   MCV 88.4 04/20/2016   PLT 177 04/20/2016   NEUTROABS 7.3 04/19/2016    '@LASTCHEMISTRY'$ @  Urine Studies No results for input(s): UHGB, CRYS in the last 72 hours.  Invalid input(s): UACOL, UAPR, USPG, UPH, UTP, UGL, UKET, UBIL, UNIT, UROB, ULEU, UEPI, UWBC, URBC, UBAC, CAST, [place], [place]  Basic Metabolic Panel:  Recent Labs Lab 04/19/16 1617 04/19/16 2252 04/20/16 0340  NA 141  --  140  K 3.8  --  4.0  CL 104  --  107  CO2 29  --  25  GLUCOSE 176*  --  295*  BUN 40*  --  36*  CREATININE 1.65* 1.65* 1.46*  CALCIUM 9.2  --  8.3*  MG  --  2.5* 2.5*  PHOS  --  6.0* 4.7*   GFR Estimated Creatinine Clearance: 41.2 mL/min (by C-G formula based on SCr of 1.46 mg/dL). Liver Function Tests:  Recent  Labs Lab 04/19/16 1617  AST 42*  ALT 32  ALKPHOS 133*  BILITOT 0.7  PROT 5.8*  ALBUMIN 2.8*   No results for input(s): LIPASE, AMYLASE in the last 168 hours. No results for input(s): AMMONIA in the last 168 hours. Coagulation profile  Recent Labs Lab 04/19/16 2252  INR 1.15    CBC:  Recent Labs Lab 04/19/16 1617 04/19/16 2252 04/20/16 0340  WBC 9.0 10.1 9.2  NEUTROABS 7.3  --   --   HGB 7.8* 7.9* 7.4*  HCT 25.7* 25.8* 23.6*  MCV 90.5 90.8 88.4  PLT 213 198 177   Cardiac Enzymes:  Recent Labs Lab 04/19/16 1617 04/19/16 2252 04/20/16 0340 04/20/16 0920  TROPONINI <0.03 <0.03 <0.03 <0.03   BNP: Invalid input(s): POCBNP CBG:  Recent Labs Lab 04/20/16 0412 04/20/16 0727 04/20/16 1140 04/20/16 1640 04/20/16 2033  GLUCAP 290* 275* 256* 239* 220*   D-Dimer No results for input(s): DDIMER in the last 72 hours. Hgb A1c No results for input(s): HGBA1C in  the last 72 hours. Lipid Profile No results for input(s): CHOL, HDL, LDLCALC, TRIG, CHOLHDL, LDLDIRECT in the last 72 hours. Thyroid function studies No results for input(s): TSH, T4TOTAL, T3FREE, THYROIDAB in the last 72 hours.  Invalid input(s): FREET3 Anemia work up No results for input(s): VITAMINB12, FOLATE, FERRITIN, TIBC, IRON, RETICCTPCT in the last 72 hours. Microbiology Recent Results (from the past 240 hour(s))  Blood Culture (routine x 2)     Status: None (Preliminary result)   Collection Time: 04/19/16  4:18 PM  Result Value Ref Range Status   Specimen Description BLOOD RIGHT ARM  Final   Special Requests BOTTLES DRAWN AEROBIC ONLY 5CC  Final   Culture   Final    NO GROWTH < 24 HOURS Performed at [facility]    Report Status PENDING  Incomplete  Blood Culture (routine x 2)     Status: None (Preliminary result)   Collection Time: 04/19/16  4:50 PM  Result Value Ref Range Status   Specimen Description BLOOD RIGHT ARM  Final   Special Requests IN PEDIATRIC BOTTLE [place]  Final    Culture   Final    NO GROWTH < 24 HOURS Performed at [facility]    Report Status PENDING  Incomplete  MRSA PCR Screening     Status: Abnormal   Collection Time: 04/19/16 10:41 PM  Result Value Ref Range Status   MRSA by PCR POSITIVE (A) NEGATIVE Final    Comment:        The GeneXpert MRSA Assay (FDA approved for NASAL specimens only), is one component of a comprehensive MRSA colonization surveillance program. It is not intended to diagnose MRSA infection nor to guide or monitor treatment for MRSA infections. RESULT CALLED TO, READ BACK BY AND VERIFIED WITH: C.DENNY,RN 0211 04/20/16 [facility]       Studies:  Dg Chest Port 1 View  Result Date: 04/20/2016 CLINICAL DATA:  Respiratory failure. EXAM: PORTABLE CHEST 1 VIEW COMPARISON:  04/19/2016.  Chest CT 04/09/2016.   PET-CT 02/27/2016. FINDINGS: Endotracheal tube and NG tube in stable position. Stable cardiomegaly. Right hilar fullness. Adenopathy cannot be excluded. Progressive right lower lobe infiltrate and/or atelectasis. Bilateral pulmonary nodules, these are best identified by prior CT. Small right pleural effusion. No pneumothorax. IMPRESSION: 1.  Endotracheal tube and NG tube in stable position. 2. Progressive right lower lobe atelectasis and or infiltrate. Small right pleural effusion. 3. Pulmonary nodules again noted. These are best identified by prior CT. Mild right hilar fullness. This may be vascular. Right hilar adenopathy cannot be excluded. 4. Stable cardiomegaly. Electronically Signed   By: Marcello Moores  Register   On: 04/20/2016 06:52   Dg Chest Portable 1 View  Result Date: 04/19/2016 CLINICAL DATA:  Status post intubation EXAM: PORTABLE CHEST 1 VIEW COMPARISON:  Chest radiograph dated 04/19/2016 at 1518 hour. CT chest dated age 34,017. FINDINGS: Endotracheal tube terminates 3 cm above the carina. Increased interstitial markings, favoring mild interstitial edema. Mild patchy right lower lobe opacity, atelectasis  versus pneumonia. Possible small right pleural effusion. No pneumothorax. Known pulmonary nodules/metastases are not radiographically evident. Cardiomegaly. Enteric tube courses into the stomach. IMPRESSION: Endotracheal tube terminates 3 cm above the carina. Suspected mild interstitial edema with possible small right pleural effusion. Mild patchy right lower lobe opacity, atelectasis versus pneumonia. Electronically Signed   By: Julian Hy M.D.   On: 04/19/2016 19:46   Dg Chest Port 1 View  Result Date: 04/19/2016 CLINICAL DATA:  Short of breath. History of metastatic  breast cancer and COPD EXAM: PORTABLE CHEST 1 VIEW COMPARISON:  02/01/2016.  Chest CT 04/09/2016 FINDINGS: Cardiac enlargement. Interval development of diffuse bilateral airspace disease since the prior chest x-ray. Lungs were also clear on the recent chest CT. Findings suggest congestive heart failure with mild edema. Right lower lobe airspace disease likely represents atelectasis but could represent pneumonia. No pleural effusion. No known metastatic disease to bone. IMPRESSION: Interval development of bilateral airspace disease, most likely congestive heart failure with edema Right lower lobe atelectasis/ pneumonia Electronically Signed   By: Franchot Gallo M.D.   On: 04/19/2016 16:55    Assessment: 70 y.o. female with metastatic breast cancer, severe COPD and systolic heart failure admitted on 8/24 with HCAP requriing intubation.   1. Acute respiratory failure with hypoxemia, pneumonia and COPD exacerbation 2. Metastatic breast cancer to lung and bone, ER+/PR-/HER2- 3. CHF, CAD, HTN 4. CKD  5. Anemia of chronic disease (CKD and malignancy) 6. Type 2 DM    Plan:  -Pt is on broad antibiotics, improved, extubated -she is currently on fulvestrant injection for metastatic breast cancer, will hold it for now. Leslee Home and Delton See have been held lately due to her productive cough -I have recently referred her to rad/onc for left  knee pain due to bone metastasis, probably will see Dr. Sondra Come after discharge -due to her multiple comorbidities, her treatment options for metastatic breast cancer is limited. She is a poor candidate for chemotherapy if she has disease progression. -I will follow up.    Truitt Merle, MD 04/20/2016  10:43 PM

## 2016-04-20 NOTE — Progress Notes (Signed)
PULMONARY / CRITICAL CARE MEDICINE   Name: Anita [name] MRN: [id number] DOB: [birth year]    ADMISSION DATE:  04/19/2016 CONSULTATION DATE:  04/19/16  REFERRING MD:  EDP / Dr. Fonnie Birkenhead  CHIEF COMPLAINT:  SOB  BRIEF: 70 y/o female with metastatic breast cancer, severe COPD and systolic heart failure admitted on 8/24 with HCAP requriing intubation.   SUBJECTIVE:  Passing pressure support trial.  VITAL SIGNS: BP (!) 79/40 (BP Location: Right Arm)   Pulse 85   Temp 98.2 F (36.8 C) (Core (Comment))   Resp (!) 23   Ht 5\' 2"  (1.575 m)   Wt 101.9 kg (224 lb 10.4 oz)   SpO2 95%   BMI 41.09 kg/m   HEMODYNAMICS:    VENTILATOR SETTINGS: Vent Mode: CPAP;PSV FiO2 (%):  [40 %-100 %] 40 % Set Rate:  [14 bmp-24 bmp] 24 bmp Vt Set:  [400 mL] 400 mL PEEP:  [5 cmH20] 5 cmH20 Pressure Support:  [5 cmH20] 5 cmH20 Plateau Pressure:  [22 cmH20-27 cmH20] 22 cmH20  INTAKE / OUTPUT: I/O last 3 completed shifts: In: 2679 [I.V.:1054; IV Piggyback:1625] Out: 850 [Urine:850]  PHYSICAL EXAMINATION: General:  Comfortable on vent HENT: NCAT, ETT In plac PULM: Rhonchi R lung, clear left, minimal wheezing, vent supported breaths CV: RRR, no mgr GI: BS+, soft, nontender MSK: normal bulk and tone Neuro: Awake and alert on vent, follows commands  LABS:  BMET  Recent Labs Lab 04/19/16 1617 04/19/16 2252 04/20/16 0340  NA 141  --  140  K 3.8  --  4.0  CL 104  --  107  CO2 29  --  25  BUN 40*  --  36*  CREATININE 1.65* 1.65* 1.46*  GLUCOSE 176*  --  295*    Electrolytes  Recent Labs Lab 04/19/16 1617 04/19/16 2252 04/20/16 0340  CALCIUM 9.2  --  8.3*  MG  --  2.5* 2.5*  PHOS  --  6.0* 4.7*    CBC  Recent Labs Lab 04/19/16 1617 04/19/16 2252 04/20/16 0340  WBC 9.0 10.1 9.2  HGB 7.8* 7.9* 7.4*  HCT 25.7* 25.8* 23.6*  PLT 213 198 177    Coag's  Recent Labs Lab 04/19/16 2252  APTT 26  INR 1.15    Sepsis Markers  Recent Labs Lab 04/19/16 1655  04/19/16 2014 04/19/16 2252 04/20/16 0340  LATICACIDVEN 1.51 1.14  --   --   PROCALCITON  --   --  1.67 1.82    ABG  Recent Labs Lab 04/19/16 2240 04/20/16 0002 04/20/16 0630  PHART 7.260* 7.380 7.416  PCO2ART 58.1* 42.1 39.7  PO2ART 106* 77.4* 60.7*    Liver Enzymes  Recent Labs Lab 04/19/16 1617  AST 42*  ALT 32  ALKPHOS 133*  BILITOT 0.7  ALBUMIN 2.8*    Cardiac Enzymes  Recent Labs Lab 04/19/16 1617 04/19/16 2252 04/20/16 0340  TROPONINI <0.03 <0.03 <0.03    Glucose  Recent Labs Lab 04/20/16 0005 04/20/16 0412 04/20/16 0727  GLUCAP 299* 290* 275*    Imaging Dg Chest Port 1 View  Result Date: 04/20/2016 CLINICAL DATA:  Respiratory failure. EXAM: PORTABLE CHEST 1 VIEW COMPARISON:  04/19/2016.  Chest CT 04/09/2016.   PET-CT 02/27/2016. FINDINGS: Endotracheal tube and NG tube in stable position. Stable cardiomegaly. Right hilar fullness. Adenopathy cannot be excluded. Progressive right lower lobe infiltrate and/or atelectasis. Bilateral pulmonary nodules, these are best identified by prior CT. Small right pleural effusion. No pneumothorax. IMPRESSION: 1.  Endotracheal tube and NG  tube in stable position. 2. Progressive right lower lobe atelectasis and or infiltrate. Small right pleural effusion. 3. Pulmonary nodules again noted. These are best identified by prior CT. Mild right hilar fullness. This may be vascular. Right hilar adenopathy cannot be excluded. 4. Stable cardiomegaly. Electronically Signed   By: Marcello Moores  Register   On: 04/20/2016 06:52   Dg Chest Portable 1 View  Result Date: 04/19/2016 CLINICAL DATA:  Status post intubation EXAM: PORTABLE CHEST 1 VIEW COMPARISON:  Chest radiograph dated 04/19/2016 at 1518 hour. CT chest dated age 70,017. FINDINGS: Endotracheal tube terminates 3 cm above the carina. Increased interstitial markings, favoring mild interstitial edema. Mild patchy right lower lobe opacity, atelectasis versus pneumonia. Possible small  right pleural effusion. No pneumothorax. Known pulmonary nodules/metastases are not radiographically evident. Cardiomegaly. Enteric tube courses into the stomach. IMPRESSION: Endotracheal tube terminates 3 cm above the carina. Suspected mild interstitial edema with possible small right pleural effusion. Mild patchy right lower lobe opacity, atelectasis versus pneumonia. Electronically Signed   By: Julian Hy M.D.   On: 04/19/2016 19:46   Dg Chest Port 1 View  Result Date: 04/19/2016 CLINICAL DATA:  Short of breath. History of metastatic breast cancer and COPD EXAM: PORTABLE CHEST 1 VIEW COMPARISON:  02/01/2016.  Chest CT 04/09/2016 FINDINGS: Cardiac enlargement. Interval development of diffuse bilateral airspace disease since the prior chest x-ray. Lungs were also clear on the recent chest CT. Findings suggest congestive heart failure with mild edema. Right lower lobe airspace disease likely represents atelectasis but could represent pneumonia. No pleural effusion. No known metastatic disease to bone. IMPRESSION: Interval development of bilateral airspace disease, most likely congestive heart failure with edema Right lower lobe atelectasis/ pneumonia Electronically Signed   By: Franchot Gallo M.D.   On: 04/19/2016 16:55     STUDIES:  CXR 8/24 inc int markings. RLL atelectasis/PNA  CULTURES: Sputum 8/24 > Blood culture 8/24 >  ANTIBIOTICS: Zosyn 8/24 > Vanc 8/24 >  SIGNIFICANT EVENTS: 8/24 admit for AoC hypoxemic/hypercapneic resp fx. Intubated after failing bipap  LINES/TUBES: ETT 8/24 > 8/25  DISCUSSION: 70 year old female,with significant comorbidities including stage IV breast cancer, severe COPD, systolic CHF with EF 123XX123, chronic hypoxemic respiratory failure on 4 L O2, admitted for acute on chronic dyspnea, failed BiPAP, subsequently intubated.  As of 04/20/2016 improved, passing SBT  ASSESSMENT / PLAN:  PULMONARY A: Acute respirator failure with hypoxemia COPD, severe,  in exacerbation, baseline 4L O2, doesn't take controller meds due to intolerance HCAP Metastatic breast cancer to the lung OSA P:   Extubate Supplemental O2, target O2 90-94% Continue albuterol See ID qHS CPAP  CARDIOVASCULAR A:  Chronic congestive heart failure, systolic, EF 123XX123. Currently not in exacerbation CAD HTN Dyslipidemia P:  Lasix today with KCL Tele Restart Metoprolol and Imdur now Cont Lipitor   RENAL A:   AKI 2/2 hypovolemia > improved CKD st 3B P:   KVO fluids Monitor BMET and UOP Replace electrolytes as needed   GASTROINTESTINAL A:   GERD P:   PPI Start TF in am  HEMATOLOGIC A:   Metastatic breast CA. Known to have 5 mm right upper lung nodule, 5 mm left upper lung nodule, multiple lytic lesions in spine/sternum/shoulder. Chest ct scan in 02/2016 > stable mets St III Laryngeal CA Anemia P:  Transfuse for Hb < 7 Holding off on chemo drugs (she gets 2 shots monthly) F/u with Oncology  INFECTIOUS A:   HCAP in RLL P:   F/U cultures  Cont vanc  and zosyn for now  ENDOCRINE A:   No issues P:   CBG q 4 while NPO Sliding scale  NEUROLOGIC A:   Sedation issues Depression Chronic pain P:   Stop sedation protocol Prn fentanyl for pain   FAMILY  - Updates: remains full code per discussion with Dr. Corrie Dandy, they were not present for my visit today  - Inter-disciplinary family meet or Palliative Care meeting due by:  August 31  I spent 38 minutes of critical care time with this patient today.  Roselie Awkward, MD [place] [facility] Pager: [phone] Cell: [phone] After 3pm or if no response, call [phone]   04/20/2016, 10:39 AM

## 2016-04-20 NOTE — Progress Notes (Signed)
PT Cancellation Note  Patient Details Name: Anita [name] MRN: [id number] DOB: [birth year]   Cancelled Treatment:    Reason Eval/Treat Not Completed: Patient not medically ready (per RN)   Claretha Cooper 04/20/2016, 1:16 PM Tresa Endo PT [phone]

## 2016-04-20 NOTE — Procedures (Signed)
Arterial Catheter Insertion Procedure Note Anita [name] [id number] 20-Dec-1945  Procedure: Insertion of Arterial Catheter  Indications: Blood pressure monitoring  Procedure Details Consent: Risks of procedure as well as the alternatives and risks of each were explained to the (patient/caregiver).  Consent for procedure obtained. Time Out: Verified patient identification, verified procedure, site/side was marked, verified correct patient position, special equipment/implants available, medications/allergies/relevent history reviewed, required imaging and test results available.  Performed  Maximum sterile technique was used including antiseptics, cap, gloves, gown, hand hygiene, mask and sheet. Skin prep: Chlorhexidine; local anesthetic administered 20 gauge catheter was inserted into right radial artery using the Seldinger technique.  Evaluation Blood flow good; BP tracing good. Complications: No apparent complications.   Winferd Humphrey 04/20/2016

## 2016-04-20 NOTE — Telephone Encounter (Signed)
Will close this message at this time due to the pt being in the ICU.

## 2016-04-21 DIAGNOSIS — D72825 Bandemia: Secondary | ICD-10-CM

## 2016-04-21 DIAGNOSIS — J189 Pneumonia, unspecified organism: Secondary | ICD-10-CM

## 2016-04-21 DIAGNOSIS — R6520 Severe sepsis without septic shock: Secondary | ICD-10-CM

## 2016-04-21 DIAGNOSIS — A419 Sepsis, unspecified organism: Principal | ICD-10-CM

## 2016-04-21 DIAGNOSIS — D7289 Other specified disorders of white blood cells: Secondary | ICD-10-CM

## 2016-04-21 DIAGNOSIS — R0602 Shortness of breath: Secondary | ICD-10-CM

## 2016-04-21 LAB — URINE CULTURE: CULTURE: NO GROWTH

## 2016-04-21 LAB — GLUCOSE, CAPILLARY
GLUCOSE-CAPILLARY: 179 mg/dL — AB (ref 65–99)
GLUCOSE-CAPILLARY: 156 mg/dL — AB (ref 65–99)
GLUCOSE-CAPILLARY: 116 mg/dL — AB (ref 65–99)
Glucose-Capillary: 190 mg/dL — ABNORMAL HIGH (ref 65–99)
Glucose-Capillary: 211 mg/dL — ABNORMAL HIGH (ref 65–99)

## 2016-04-21 LAB — CREATININE, SERUM
Creatinine, Ser: 1.61 mg/dL — ABNORMAL HIGH (ref 0.44–1.00)
GFR calc Af Amer: 37 mL/min — ABNORMAL LOW
GFR calc non Af Amer: 32 mL/min — ABNORMAL LOW

## 2016-04-21 MED ORDER — IPRATROPIUM-ALBUTEROL 0.5-2.5 (3) MG/3ML IN SOLN
3.0000 mL | Freq: Four times a day (QID) | RESPIRATORY_TRACT | Status: DC
  Administered 2016-04-21 – 2016-04-22 (×5): 3 mL via RESPIRATORY_TRACT
  Filled 2016-04-21 (×5): qty 3

## 2016-04-21 MED ORDER — SODIUM CHLORIDE 0.9 % IV SOLN
Freq: Once | INTRAVENOUS | Status: AC
  Administered 2016-04-21: 500 mL via INTRAVENOUS

## 2016-04-21 MED ORDER — CHLORHEXIDINE GLUCONATE 0.12 % MT SOLN
15.0000 mL | Freq: Two times a day (BID) | OROMUCOSAL | Status: DC
  Administered 2016-04-21 – 2016-04-28 (×12): 15 mL via OROMUCOSAL
  Filled 2016-04-21 (×12): qty 15

## 2016-04-21 MED ORDER — TORSEMIDE 20 MG PO TABS
40.0000 mg | ORAL_TABLET | Freq: Two times a day (BID) | ORAL | Status: DC
  Administered 2016-04-21: 40 mg via ORAL
  Filled 2016-04-21: qty 2

## 2016-04-21 MED ORDER — METHYLPREDNISOLONE SODIUM SUCC 40 MG IJ SOLR
40.0000 mg | Freq: Two times a day (BID) | INTRAMUSCULAR | Status: DC
  Administered 2016-04-21 – 2016-04-23 (×4): 40 mg via INTRAVENOUS
  Filled 2016-04-21 (×4): qty 1

## 2016-04-21 MED ORDER — ORAL CARE MOUTH RINSE
15.0000 mL | Freq: Two times a day (BID) | OROMUCOSAL | Status: DC
  Administered 2016-04-22 – 2016-04-28 (×10): 15 mL via OROMUCOSAL

## 2016-04-21 MED ORDER — FUROSEMIDE 10 MG/ML IJ SOLN: 40.0000 mg | Freq: Four times a day (QID) | INTRAMUSCULAR | Status: DC

## 2016-04-21 MED ORDER — DEXTROSE 5 % IV SOLN
2.0000 g | INTRAVENOUS | Status: AC
  Administered 2016-04-21 – 2016-04-27 (×7): 2 g via INTRAVENOUS
  Filled 2016-04-21 (×7): qty 2

## 2016-04-21 NOTE — Progress Notes (Signed)
PULMONARY / CRITICAL CARE MEDICINE   Name: Anita [name] MRN: [id number] DOB: Dec 04, 1945    ADMISSION DATE:  04/19/2016 CONSULTATION DATE:  04/19/16  REFERRING MD:  EDP / Dr. Fonnie Birkenhead  CHIEF COMPLAINT:  SOB  BRIEF: 70 y/o female with metastatic breast cancer, severe COPD and systolic heart failure admitted on 8/24 with HCAP requriing intubation.   SUBJECTIVE:  Off vent, no distress  VITAL SIGNS: BP (!) 107/50   Pulse 88   Temp 97.2 F (36.2 C)   Resp 20   Ht 5\' 2"  (1.575 m)   Wt 102.3 kg (225 lb 8.5 oz)   SpO2 96%   BMI 41.25 kg/m   HEMODYNAMICS:    VENTILATOR SETTINGS: Vent Mode: CPAP;PSV FiO2 (%):  [40 %] 40 % PEEP:  [5 cmH20] 5 cmH20 Pressure Support:  [5 cmH20] 5 cmH20  INTAKE / OUTPUT: I/O last 3 completed shifts: In: 3602.8 [I.V.:2571.8; NG/GT:6; IV Piggyback:1025] Out: 2350 [Urine:2350]  PHYSICAL EXAMINATION: General:  Comfortable in bed , NO vent HENT: jvd wnl PULM: coarse ant CV: RRR, no m GI: BS+, soft, nontender MSK: normal bulk and tone Neuro: Awake and alert, follows commands, nonfocal exam  LABS:  BMET  Recent Labs Lab 04/19/16 1617 04/19/16 2252 04/20/16 0340 04/21/16 0500  NA 141  --  140  --   K 3.8  --  4.0  --   CL 104  --  107  --   CO2 29  --  25  --   BUN 40*  --  36*  --   CREATININE 1.65* 1.65* 1.46* 1.61*  GLUCOSE 176*  --  295*  --     Electrolytes  Recent Labs Lab 04/19/16 1617 04/19/16 2252 04/20/16 0340  CALCIUM 9.2  --  8.3*  MG  --  2.5* 2.5*  PHOS  --  6.0* 4.7*    CBC  Recent Labs Lab 04/19/16 1617 04/19/16 2252 04/20/16 0340  WBC 9.0 10.1 9.2  HGB 7.8* 7.9* 7.4*  HCT 25.7* 25.8* 23.6*  PLT 213 198 177    Coag's  Recent Labs Lab 04/19/16 2252  APTT 26  INR 1.15    Sepsis Markers  Recent Labs Lab 04/19/16 1655 04/19/16 2014 04/19/16 2252 04/20/16 0340  LATICACIDVEN 1.51 1.14  --   --   PROCALCITON  --   --  1.67 1.82    ABG  Recent Labs Lab 04/19/16 2240  04/20/16 0002 04/20/16 0630  PHART 7.260* 7.380 7.416  PCO2ART 58.1* 42.1 39.7  PO2ART 106* 77.4* 60.7*    Liver Enzymes  Recent Labs Lab 04/19/16 1617  AST 42*  ALT 32  ALKPHOS 133*  BILITOT 0.7  ALBUMIN 2.8*    Cardiac Enzymes  Recent Labs Lab 04/19/16 2252 04/20/16 0340 04/20/16 0920  TROPONINI <0.03 <0.03 <0.03    Glucose  Recent Labs Lab 04/20/16 1140 04/20/16 1640 04/20/16 2033 04/20/16 2336 04/21/16 0402 04/21/16 0739  GLUCAP 256* 239* 220* 209* 190* 179*    Imaging No results found.   STUDIES:  CXR 8/24 inc int markings. RLL atelectasis/PNA  CULTURES: Sputum 8/24 > Blood culture 8/24 >  ANTIBIOTICS: Zosyn 8/24 > Vanc 8/24 >  SIGNIFICANT EVENTS: 8/24 admit for AoC hypoxemic/hypercapneic resp fx. Intubated after failing bipap 8/25- extubated  LINES/TUBES: ETT 8/24 > 8/25  DISCUSSION: 70 year old female,with significant comorbidities including stage IV breast cancer, severe COPD, systolic CHF with EF 123XX123, chronic hypoxemic respiratory failure on 4 L O2, admitted for acute on chronic dyspnea,  failed BiPAP, subsequently intubated.  As of 04/21/2016 improved, passing SBT  ASSESSMENT / PLAN:  PULMONARY A: Acute respirator failure with hypoxemia COPD, severe, in exacerbation, baseline 4L O2, doesn't take controller meds due to intolerance HCAP Metastatic breast cancer to the lung OSA P:   Supplemental O2, target O2 90-94%, attempt to get to 5 liters Continue albuterol See ID qHS CPAP IS as able Favor neg balance as tolerated with effusion / atx rt base  CARDIOVASCULAR A:  Chronic congestive heart failure, systolic, EF 123XX123. Currently not in exacerbation CAD HTN Dyslipidemia P:  Lasix as renal fxn tolerates Tele Metoprolol and Imdur now Cont Lipitor  RENAL A:   AKI 2/2 hypovolemia > improved CKD st 3B P:   Await full labs this am Was pos 443 cc with lasix x 1, would favor further neg balance if renal fxn can  tolerate Lasix to bid\ Chem in am   GASTROINTESTINAL A:   GERD R/o dysphagia P:   PPI SLP as she reports some dryness with pills If pass then add reg diet  HEMATOLOGIC A:   Metastatic breast CA. Known to have 5 mm right upper lung nodule, 5 mm left upper lung nodule, multiple lytic lesions in spine/sternum/shoulder. Chest ct scan in 02/2016 > stable mets St III Laryngeal CA Anemia P:  Transfuse for Hb < 7 Holding off on chemo drugs (she gets 2 shots monthly) F/u with Oncology pcxr in am  May need ct chest for nodules  INFECTIOUS A:   HCAP in RLL Culture neg P:   F/U cultures , remain neg Dc vanc and zosyn, add ceftriaxone  ENDOCRINE A:   On steroids P:   CBG q 4 while NPO Sliding scale Reduce steroids  NEUROLOGIC A:   Sedation issues Depression Chronic pain P:   Prn fentanyl for pain   FAMILY  - Updates: to pt  - Inter-disciplinary family meet or Palliative Care meeting due by:  August 31  Make her sdu status Get pt  Lavon Paganini. Titus Mould, MD, [place] Pgr: [facility]

## 2016-04-21 NOTE — Evaluation (Signed)
Clinical/Bedside Swallow Evaluation Patient Details  Name: Anita [name] MRN: [id number] Date of Birth: [birth year]  Today's Date: 04/21/2016 Time: SLP Start Time (ACUTE ONLY): 1330 SLP Stop Time (ACUTE ONLY): 1358 SLP Time Calculation (min) (ACUTE ONLY): 28 min  Past Medical History:  Past Medical History:  Diagnosis Date  . Anemia    a. Adm 09/2012 with melena, Hgb 5.8 -> transfused. EGD/colonoscopy unrevealing.  . Anemia of chronic disease   . Breast cancer ([place])    a. Mets to bone. ER 100%/ PR 0%/Her2 neu negative.  [name] CAD (coronary artery disease)    a. 08/2012 NSTEMI: Med Rx; b. 10/2012 MV: EF 49%, old inf infarct;  c. 02/2013 NSTEMI-> Med Rx; d. 06/2013 Cath: LM nl, LAD 40p, 80-85p/m, D1 nl, LCX 20p, OM1 nl, RCA 100d w/ L->R collats, EF 30-35%, basal to mid inf AK, ant HK.  . Chronic diastolic CHF (congestive heart failure) ([place])    a. 01/2014 Echo: EF 55%, triv PR.  [name] Chronic respiratory failure ([place])    a. On O2 qhs and w/ exertion.  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 CKD (chronic kidney disease), stage IV ([place])   . COPD (chronic obstructive pulmonary disease) ([place])    a. Uses Home O2 w/ exertion.  . Dementia    very mild  . Depression   . Diabetes mellitus without complication ([place])   . Fibroid   . History of tobacco abuse    a. Quit 2014.  [name] Hyperlipidemia 02/11/2013  . Lesion of vocal cord    a. CT 05/2013 concerning for tumor.  . Morbid obesity ([place])   . Radiation 02/02/14-02/24/14   left and right femur 30 gray  . Ulcers of both lower legs [facility])    Past Surgical History:  Past Surgical History:  Procedure Laterality Date  . COLONOSCOPY, ESOPHAGOGASTRODUODENOSCOPY (EGD) AND ESOPHAGEAL DILATION N/A 10/13/2012   Procedure: colonoscopy and egd;  Surgeon: Wonda Horner, MD;  Location: [facility];  Service: Endoscopy;  Laterality: N/A;  . LEFT HEART CATHETERIZATION WITH CORONARY ANGIOGRAM N/A 07/07/2013   Procedure: LEFT HEART  CATHETERIZATION WITH CORONARY ANGIOGRAM;  Surgeon: Peter M Martinique, MD;  Location: [facility] CATH LAB;  Service: Cardiovascular;  Laterality: N/A;   HPI:  70 yo female adm to [facility] with respiratory deficits - requiring intubation.  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    Pt extubated within one day of admit.       Assessment / Plan / Recommendation Clinical Impression  Pt presents with concerns for pharyngeal dysphagia/silent asp risk with possible vagus nerve involvement given potential lung mets and inability to phonate.  She also become dyspneic with attempts to phonate and blood pressure is soft at this time.  Pt with delayed cough x2 during minimal intake- concerning for airway compromise.   Pt denies dysphagia prior to admit but with right lobe atx/pna and medical dx, risk is present.  Vocal fold nodule and laryngeal asymmetry right more than left 12/2013.  Recommend MBS before po diet due to these risk factors - Per RN, pt's blood pressure too soft to allow MBS.   Educated pt to clinical reasoning for MBS and npo except medicine and ice chips.       Aspiration Risk  Moderate aspiration risk;Severe aspiration risk    Diet Recommendation   NPO  except meds and single ice chips       Other  Recommendations Oral Care Recommendations: Oral care QID (ice chips only and needed po medicine )   Follow up Recommendations       Frequency and Duration min 1 x/week  1 week       Prognosis Prognosis for Safe Diet Advancement: Fair      Swallow Study   General Date of Onset: 04/21/16 HPI: 70 yo female adm to [facility] with respiratory deficits - requiring intubation.  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    Pt extubated within one day of admit.     Type of Study: Bedside Swallow Evaluation Temperature Spikes  Noted: No Respiratory Status: Nasal cannula History of Recent Intubation: Yes Length of Intubations (days): 1 days Date extubated: 04/20/16 Behavior/Cognition: Alert;Cooperative;Pleasant mood Oral Cavity Assessment: Within Functional Limits Oral Care Completed by SLP: No Oral Cavity - Dentition: Edentulous;Other (Comment) Self-Feeding Abilities: Needs assist Patient Positioning: Upright in bed Baseline Vocal Quality: Aphonic;Hoarse;Low vocal intensity Volitional Cough: Weak Volitional Swallow: Able to elicit    Oral/Motor/Sensory Function Overall Oral Motor/Sensory Function: Mild impairment   Ice Chips Ice chips: Impaired Presentation: Spoon Oral Phase Impairments: Reduced lingual movement/coordination;Poor awareness of bolus Oral Phase Functional Implications: Prolonged oral transit Pharyngeal Phase Impairments: Suspected delayed Swallow   Thin Liquid Thin Liquid: Not tested    Nectar Thick Nectar Thick Liquid: Impaired Presentation: Spoon Oral Phase Impairments: Reduced lingual movement/coordination;Poor awareness of bolus Pharyngeal Phase Impairments: Decreased hyoid-laryngeal movement;Multiple swallows   Honey Thick Honey Thick Liquid: Not tested   Puree Puree: Impaired Presentation: Self Fed;Spoon Oral Phase Impairments: Reduced labial seal Oral Phase Functional Implications: Prolonged oral transit Pharyngeal Phase Impairments: Decreased hyoid-laryngeal movement;Cough - Delayed   Solid   GO   Solid: Not tested        Claudie Fisherman, [place] [facility] SLP [phone]

## 2016-04-21 NOTE — Evaluation (Signed)
Physical Therapy Evaluation Patient Details Name: Anita [name] MRN: [id number] DOB: [birth year] Today's Date: 04/21/2016   History of Present Illness  70 y/o female with metastatic breast cancer, severe COPD and systolic heart failure admitted on 8/24 with HCAP requriing intubation.   Clinical Impression  Assisted the patient back to the bed with 2 person assist and walker. She  Was noted to be SOB with exertion with RR into 30's. . Oxygent satutration noted to be >90%. The patient states she wants to return home. Pt admitted with above diagnosis. Pt currently with functional limitations due to the deficits listed below (see PT Problem List).  Pt will benefit from skilled PT to increase their independence and safety with mobility to allow discharge to the venue listed below.      Follow Up Recommendations SNF;Supervision/Assistance - 24 hour (patient states that she will go home, spouse not present to discuss)    Equipment Recommendations  None recommended by PT    Recommendations for Other Services       Precautions / Restrictions Precautions Precautions: Fall Precaution Comments: oxygen      Mobility  Bed Mobility Overal bed mobility: Needs Assistance;+ 2 for safety/equipment Bed Mobility: Sit to Supine       Sit to supine: Max assist   General bed mobility comments: assist  with legs and trunk  Transfers Overall transfer level: Needs assistance Equipment used: Standard walker Transfers: Sit to/from Stand;Stand Pivot Transfers Sit to Stand: Min assist;+2 safety/equipment Stand pivot transfers: +2 safety/equipment;Min assist       General transfer comment: moves quickly, noted RR 30's, sats on 5 liters > 90%.  able to bear weight and turn around to get to bed.  Ambulation/Gait                Stairs            Wheelchair Mobility    Modified Rankin (Stroke Patients Only)       Balance Overall balance assessment: Needs  assistance Sitting-balance support: Feet supported;Bilateral upper extremity supported Sitting balance-Leahy Scale: Fair     Standing balance support: Bilateral upper extremity supported;During functional activity Standing balance-Leahy Scale: Fair                               Pertinent Vitals/Pain Pain Assessment: No/denies pain    Home Living Family/patient expects to be discharged to:: Private residence Living Arrangements: Spouse/significant other Available Help at Discharge: Family;Available PRN/intermittently Type of Home: House Home Access: Ramped entrance     Home Layout: One level Home Equipment: Cane - quad;Bedside commode;Wheelchair - [employer] - 4 wheels;Shower seat      Prior Function Level of Independence: Needs assistance   Gait / Transfers Assistance Needed: States she uses cane or RW  ADL's / Homemaking Assistance Needed: Assist needed for bathing only per pt        Hand Dominance        Extremity/Trunk Assessment   Upper Extremity Assessment: Generalized weakness           Lower Extremity Assessment: Generalized weakness      Cervical / Trunk Assessment: Normal  Communication      Cognition Arousal/Alertness: Awake/alert Behavior During Therapy: WFL for tasks assessed/performed Overall Cognitive Status: No family/caregiver present to determine baseline cognitive functioning                      General Comments  Exercises        Assessment/Plan    PT Assessment Patient needs continued PT services  PT Diagnosis Difficulty walking;Generalized weakness   PT Problem List Decreased strength;Decreased activity tolerance;Decreased mobility;Cardiopulmonary status limiting activity  PT Treatment Interventions DME instruction;Gait training;Functional mobility training;Therapeutic activities;Therapeutic exercise;Patient/family education   PT Goals (Current goals can be found in the Care Plan section) Acute  Rehab PT Goals Patient Stated Goal: I am going home PT Goal Formulation: With patient Time For Goal Achievement: 05/05/16 Potential to Achieve Goals: Good    Frequency Min 3X/week   Barriers to discharge        Co-evaluation               End of Session   Activity Tolerance: Treatment limited secondary to medical complications (Comment) (SOB) Patient left: in bed;with call bell/phone within reach;with bed alarm set Nurse Communication: Mobility status         Time: [id number] PT Time Calculation (min) (ACUTE ONLY): 15 min   Charges:   PT Evaluation $PT Eval Moderate Complexity: 1 Procedure     PT G CodesClaretha Cooper 04/21/2016, 1:05 PM Tresa Endo PT [phone]

## 2016-04-21 NOTE — Progress Notes (Signed)
Pt. Refuses CPAP @ this time, RT to monitor.

## 2016-04-22 ENCOUNTER — Inpatient Hospital Stay (HOSPITAL_COMMUNITY): Payer: Commercial Managed Care - HMO

## 2016-04-22 ENCOUNTER — Encounter: Payer: Self-pay | Admitting: Hematology

## 2016-04-22 LAB — BASIC METABOLIC PANEL
ANION GAP: 8 (ref 5–15)
BUN: 46 mg/dL — ABNORMAL HIGH (ref 6–20)
CHLORIDE: 113 mmol/L — AB (ref 101–111)
CO2: 24 mmol/L (ref 22–32)
CREATININE: 1.5 mg/dL — AB (ref 0.44–1.00)
Calcium: 7.8 mg/dL — ABNORMAL LOW (ref 8.9–10.3)
GFR calc non Af Amer: 35 mL/min — ABNORMAL LOW (ref >60)
GFR, EST AFRICAN AMERICAN: 40 mL/min — AB (ref >60)
Glucose, Bld: 164 mg/dL — ABNORMAL HIGH (ref 65–99)
POTASSIUM: 4.2 mmol/L (ref 3.5–5.1)
Sodium: 145 mmol/L (ref 135–145)

## 2016-04-22 LAB — GLUCOSE, CAPILLARY
GLUCOSE-CAPILLARY: 163 mg/dL — AB (ref 65–99)
GLUCOSE-CAPILLARY: 163 mg/dL — AB (ref 65–99)
GLUCOSE-CAPILLARY: 196 mg/dL — AB (ref 65–99)
GLUCOSE-CAPILLARY: 248 mg/dL — AB (ref 65–99)

## 2016-04-22 LAB — MAGNESIUM: MAGNESIUM: 2.9 mg/dL — AB (ref 1.7–2.4)

## 2016-04-22 LAB — PHOSPHORUS: Phosphorus: 5.6 mg/dL — ABNORMAL HIGH (ref 2.5–4.6)

## 2016-04-22 MED ORDER — ACETYLCYSTEINE 20 % IN SOLN
4.0000 mL | RESPIRATORY_TRACT | Status: AC
  Administered 2016-04-22 (×3): 4 mL via RESPIRATORY_TRACT
  Filled 2016-04-22 (×3): qty 4

## 2016-04-22 MED ORDER — TORSEMIDE 20 MG PO TABS
40.0000 mg | ORAL_TABLET | Freq: Two times a day (BID) | ORAL | Status: DC
  Administered 2016-04-22 – 2016-04-28 (×13): 40 mg via ORAL
  Filled 2016-04-22 (×15): qty 2

## 2016-04-22 MED ORDER — IPRATROPIUM-ALBUTEROL 0.5-2.5 (3) MG/3ML IN SOLN
3.0000 mL | Freq: Four times a day (QID) | RESPIRATORY_TRACT | Status: DC
  Administered 2016-04-22 – 2016-04-23 (×6): 3 mL via RESPIRATORY_TRACT
  Filled 2016-04-22 (×7): qty 3

## 2016-04-22 MED ORDER — GUAIFENESIN ER 600 MG PO TB12
1200.0000 mg | ORAL_TABLET | Freq: Two times a day (BID) | ORAL | Status: DC
  Administered 2016-04-22: 1200 mg via ORAL
  Filled 2016-04-22 (×2): qty 2

## 2016-04-22 MED ORDER — RESOURCE THICKENUP CLEAR PO POWD
ORAL | Status: DC | PRN
  Filled 2016-04-22 (×4): qty 125

## 2016-04-22 MED ORDER — GUAIFENESIN 100 MG/5ML PO SOLN
5.0000 mL | Freq: Four times a day (QID) | ORAL | Status: DC
  Administered 2016-04-22 – 2016-04-25 (×13): 100 mg via ORAL
  Administered 2016-04-26: 5 mL via ORAL
  Administered 2016-04-26 – 2016-04-28 (×8): 100 mg via ORAL
  Filled 2016-04-22 (×22): qty 10

## 2016-04-22 MED ORDER — ALBUTEROL SULFATE (2.5 MG/3ML) 0.083% IN NEBU: 2.5000 mg | INHALATION_SOLUTION | RESPIRATORY_TRACT | Status: DC | PRN

## 2016-04-22 NOTE — Progress Notes (Signed)
Pt complaining of chest tightness. RN notified MD of symptoms. RN assessed lung sounds, unchanged. PRN nebulizer treatment ordered. Respiratory therapist notified. Will continue to monitor.

## 2016-04-22 NOTE — Progress Notes (Signed)
Pt complaining of dry mouth and thirst. RN provided pt with ice chips, pt began coughing with each administration of ice chip. Cough non-productive. Will continue to monitor.

## 2016-04-22 NOTE — Progress Notes (Signed)
[place], [place] [id number]  CHIEF COMPLAIN: follow up metastatic breast cancer     Breast cancer metastasized to bone ([place])   10/08/2012 Imaging    CT chest, abdomen and pelvis showed extensive sclerotic lesions throughout the bones, 4.9 x 3.4 cm soft tissue versus hematoma anterior to the spleen. 6 mm groundglass nodule in the right upper lobe of lung.      10/10/2012 Initial Biopsy    Right breast core needle biopsy showed invasive lobular carcinoma, grade 2. Left iliac crest bone biopsy showed metastatic carcinoma, morphology similar to breast biopsy.      10/14/2012 Initial Diagnosis    Breast cancer metastasized to bone [facility])      11/2012 - 07/17/2015 Anti-estrogen oral therapy    Letrozole '1mg'$  daily, add palbociclib on 01/01/2014, dose modified to 2 weeks on and 1 week off with cycle 3, stopped due to disease progression      02/02/2014 - 02/24/2014 Radiation Therapy    Palliative radiation to left and the right femur, 30 gray in 10 fractions, by Dr. Sondra Come       09/01/2014 Imaging    CT chest abdomen and pelvis showed several small pulmonary nodules are not changed. Stable widespread bone metastasis.      10/10/2014 Receptors her2    ER 100% positive, PR negative, HER-2 negative.      02/08/2015 Imaging    PET scan showed a stable 8 mm right level III lymph node with SUV 8.8, no new neck adenopathy, stable diffuse osseous metastasis, stable for many nodules.      07/04/2015 Progression     PET scan showed overal dasease progression in the bones , slightly increased hypermetabolic activ of the lung nodules , similar appearance of hypermetaabolic rght level III lymph node.      07/18/2015 -  Anti-estrogen oral therapy     fulvestrant 500 mg every 4 weeks ( loading dose every  weeksX2),  and Ibrance '100mg'$  daily,  3 week on, 1 week off      11/04/2015 Imaging    PET scan showed interval  response to therapy, especially bone metastasis. Right level III cervical node and right upper lobe pulmonary nodule also demonstrate decreased hypermetabolism.        CURRENT THERAPY:   1. She started letrozole in 11/2012.  Added Palbociclib 125 mg daily for 21 days on and 7 days off on 01/01/2014 with disease progression. Dose modified to 2 weeks on 1 week off with cycle 3.  Letrozole changed to fulvestrant on 07/18/2015 due to disease progress, and Ibrance changed to '100mg'$  daily, 3 weeks on and one week off.  2. zometa added on 01/01/2014 for metastatic bone disease. Zometa held due to renal insufficiency and changed to [facility] because of skeletal disease progression and safe renal profile.   INTERVAL HISTORY:  Anita [name] 70 y.o. female with a history of right breast cancer with metastases to the bone/lungs here for follow-up.  She had restaging CT chest done today and is here to discuss the scan findings. She still has persistent productive cough, slightly better overall. No worsening dyspnea, no fever or chills. She received a blood transfusion 2 weeks ago, and felt better after transfusion, still has moderate fatigue, no clinical signs of bleeding.    MEDICAL HISTORY: Past Medical History:  Diagnosis Date  . Anemia    a. Adm 09/2012 with melena,  Hgb 5.8 -> transfused. EGD/colonoscopy unrevealing.  . Anemia of chronic disease   . Breast cancer ([place])    a. Mets to bone. ER 100%/ PR 0%/Her2 neu negative.  [name] CAD (coronary artery disease)    a. 08/2012 NSTEMI: Med Rx; b. 10/2012 MV: EF 49%, old inf infarct;  c. 02/2013 NSTEMI-> Med Rx; d. 06/2013 Cath: LM nl, LAD 40p, 80-85p/m, D1 nl, LCX 20p, OM1 nl, RCA 100d w/ L->R collats, EF 30-35%, basal to mid inf AK, ant HK.  . Chronic diastolic CHF (congestive heart failure) ([place])    a. 01/2014 Echo: EF 55%, triv PR.  [name] Chronic respiratory failure ([place])    a. On O2 qhs and w/ exertion.  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 CKD (chronic kidney disease), stage IV ([place])   . COPD (chronic obstructive pulmonary disease) ([place])    a. Uses Home O2 w/ exertion.  . Dementia    very mild  . Depression   . Diabetes mellitus without complication ([place])   . Fibroid   . History of tobacco abuse    a. Quit 2014.  [name] Hyperlipidemia 02/11/2013  . Lesion of vocal cord    a. CT 05/2013 concerning for tumor.  . Morbid obesity ([place])   . Radiation 02/02/14-02/24/14   left and right femur 30 gray  . Ulcers of both lower legs ([facility])     ALLERGIES:  is allergic to omnipaque [iohexol] and benzene.  MEDICATIONS:    Medication List       Accurate as of 04/09/16 11:59 PM. Always use your most recent med list.          acetaminophen 325 MG tablet Commonly known as:  TYLENOL Take 2 tablets (650 mg total) by mouth every 4 (four) hours as needed for headache or mild pain.   albuterol 108 (90 Base) MCG/ACT inhaler Commonly known as:  PROVENTIL HFA;VENTOLIN HFA Inhale 2 puffs into the lungs every 6 (six) hours as needed for wheezing or shortness of breath.   albuterol (2.5 MG/3ML) 0.083% nebulizer solution Commonly known as:  PROVENTIL Inhale 3 mLs into the lungs every 6 (six) hours as needed for shortness of breath or wheezing.   aspirin 81 MG EC tablet Take 1 tablet (81 mg total) by mouth daily.   atorvastatin 40 MG tablet Commonly known as:  LIPITOR TAKE 1 TABLET EVERY DAY   bag balm Oint ointment Apply 1 g topically as needed for dry skin or irritation.   Cetirizine HCl 10 MG Caps Take 1 capsule (10 mg total) by mouth daily.   clopidogrel 75 MG tablet Commonly known as:  PLAVIX Take 1 tablet (75 mg total) by mouth daily.   clotrimazole 1 % cream Commonly known as:  LOTRIMIN Apply 1 application topically daily.   docusate sodium 100 MG capsule Commonly known as:  COLACE Take 100 mg by mouth daily.   FASLODEX 250 MG/5ML injection Generic drug:  fulvestrant Inject 250 mg into the muscle  every 30 (thirty) days. Cancer center   fluticasone 50 MCG/ACT nasal spray Commonly known as:  FLONASE Place 2 sprays into both nostrils daily.   HYDROcodone-acetaminophen 5-325 MG tablet Commonly known as:  NORCO/VICODIN Take 1 tablet by mouth every 6 (six) hours as needed for moderate pain.   hydrocortisone 2.5 % ointment Apply topically 2 (two) times daily.   HYDROmorphone 2 MG tablet Commonly known as:  DILAUDID Take 1 tablet (2  mg total) by mouth every 4 (four) hours as needed for severe pain.   isosorbide mononitrate 60 MG 24 hr tablet Commonly known as:  IMDUR Take 1 tablet (60 mg total) by mouth daily.   metoprolol tartrate 25 MG tablet Commonly known as:  LOPRESSOR Take 1 tablet (25 mg total) by mouth 2 (two) times daily.   mirtazapine 15 MG tablet Commonly known as:  REMERON Take 1 tablet (15 mg total) by mouth at bedtime.   nitroGLYCERIN 0.4 MG SL tablet Commonly known as:  NITROSTAT Place 1 tablet (0.4 mg total) under the tongue every 5 (five) minutes as needed for chest pain.   NON FORMULARY Place 3 L into the nose as needed (oxygen).   omeprazole 40 MG capsule Commonly known as:  PRILOSEC Take 1 capsule (40 mg total) by mouth daily.   palbociclib 100 MG capsule Commonly known as:  IBRANCE Take 1 capsule (100 mg total) by mouth daily with breakfast. Take whole with food.   potassium chloride SA 20 MEQ tablet Commonly known as:  K-DUR,KLOR-CON Take 2 tablets (40 mEq total) by mouth 2 (two) times daily.   torsemide 20 MG tablet Commonly known as:  DEMADEX TAKE 2 TABLETS TWICE DAILY   VITAMIN C PO Take 1 tablet by mouth daily.   VITAMIN D (CHOLECALCIFEROL) PO Take 1,000 mg by mouth daily.   XGEVA 120 MG/1.7ML Soln injection Generic drug:  denosumab Inject 120 mg into the skin once. [facility] at [facility]       SURGICAL HISTORY:  Past Surgical History:  Procedure Laterality Date  . COLONOSCOPY, ESOPHAGOGASTRODUODENOSCOPY (EGD) AND ESOPHAGEAL  DILATION N/A 10/13/2012   Procedure: colonoscopy and egd;  Surgeon: Graylin Shiver, MD;  Location: [facility] ENDOSCOPY;  Service: Endoscopy;  Laterality: N/A;  . LEFT HEART CATHETERIZATION WITH CORONARY ANGIOGRAM N/A 07/07/2013   Procedure: LEFT HEART CATHETERIZATION WITH CORONARY ANGIOGRAM;  Surgeon: Peter M Swaziland, MD;  Location: [facility] CATH LAB;  Service: Cardiovascular;  Laterality: N/A;   REVIEW OF SYSTEMS:   Constitutional: Denies fevers, chills or abnormal weight loss Eyes: Denies blurriness of vision Ears, nose, mouth, throat, and face: Denies mucositis or sore throat Respiratory: Denies cough, (+) stable dyspnea, no wheezes Cardiovascular: Denies palpitation, chest discomfort or lower extremity swelling Gastrointestinal:  Denies nausea, heartburn or change in bowel habits Skin: Denies abnormal skin rashes Lymphatics: Denies new lymphadenopathy or easy bruising Neurological:Denies numbness, tingling or new weaknesses Behavioral/Psych: Mood is stable, no new changes  All other systems were reviewed with the patient and are negative.  PHYSICAL EXAMINATION: ECOG PERFORMANCE STATUS: 3 Blood pressure (!) 96/42, pulse 97, temperature 97.7 F (36.5 C), temperature source Oral, resp. rate 18, height 5\' 2"  (1.575 m), SpO2 97 %. GENERAL:alert, no distress and comfortable morbidly obese woman in wheelchair, on [place] oxygen, chronically ill appearing SKIN: skin color, texture, turgor are normal; multiple skin scratches on her legs  EYES: normal, Conjunctiva are pink and non-injected, sclera clear OROPHARYNX:no exudate, no erythema and lips, buccal mucosa, and tongue normal  NECK: supple, thyroid normal size, non-tender, without nodularity LYMPH:  no palpable lymphadenopathy in the cervical, axillary LUNGS: coarse breathing with slightly increased breathing rate, (+) crackers on bilateral bases HEART: regular rate & rhythm and no murmurs and 2+ lower extremity edema BREAST: deferred ABDOMEN:abdomen soft,  non-tender and normal bowel sounds; obese Musculoskeletal:no cyanosis of digits and no clubbing; right lower extremities are wrapped with gauze up to knees. Left leg exam showed pitting edema to knee, mild diffuse skin  erythema above the ankle, scratch markers, no open wound. Skin temperature is normal. NEURO: alert & oriented x 3 with fluent speech, no focal motor/sensory deficits  Lab results: Results for LIBBI, TOWNER (MRN [id number]) as of 04/22/2016 14:55  Ref. Range 03/23/2016 14:47 04/09/2016 13:30  WBC Latest Ref Range: 3.9 - 10.3 10e3/uL 1.9 (L) 6.4  RBC Latest Ref Range: 3.70 - 5.45 10e6/uL 2.35 (L) 2.45 (L)  Hemoglobin Latest Ref Range: 11.6 - 15.9 g/dL 6.5 (LL) 6.8 (LL)  HCT Latest Ref Range: 34.8 - 46.6 % 20.5 (L) 21.3 (L)  MCV Latest Ref Range: 79.5 - 101.0 fL 87.6 87.1  MCH Latest Ref Range: 25.1 - 34.0 pg 27.7 27.8  MCHC Latest Ref Range: 31.5 - 36.0 g/dL 31.6 31.9  RDW Latest Ref Range: 11.2 - 14.5 % 21.1 (H) 21.0 (H)  Platelets Latest Ref Range: 145 - 400 10e3/uL 174 288   Results for Anita [name], Anita [name] (MRN [id number]) as of 04/22/2016 14:55  Ref. Range 11/03/2015 15:20 12/23/2015 13:50 12/30/2015 14:18 04/09/2016 13:30  CA 27.29 Latest Ref Range: 0.0 - 38.6 U/mL 71.9 (H) 92.2 (H) 98.5 (H) 127.8 (H)   PATHOLOGY REPORT  Diagnosis 10/10/2014 1. Bone, biopsy, left iliac crest - METASTATIC MAMMARY CARCINOMA, SEE COMMENT. 2. Breast, right, needle core biopsy - INVASIVE MAMMARY CARCINOMA, SEE COMMENT. Microscopic Comment 1. and 2. The breast biopsy demonstrates definitive morphologic features of invasive mammary carcinoma. Although the grade of tumor is best assessed at resection, with these biopsies, the carcinoma is grade II. An E-Cadherin stain is pending and will be reported as an addendum. The morphology of the metastatic carcinoma in part 2 is identical to the breast biopsy. Breast prognostic studies are pending (part 2) and will be reported as an addendum. The case was reviewed  with Dr. Avis Epley who concurs. The case was discussed with Dr. Lamonte Sakai on 10/13/2012.  RADIOGRAPHIC STUDIES:  CT chest wo contrast 04/09/2016 IMPRESSION: 1. Moderately motion degraded exam. 2. Grossly similar large volume osseous metastasis. 3. Grossly similar bilateral pulmonary nodules since 03/25/2016. Extremely poorly evaluated. 4. New subcarinal node is not pathologic by size criteria but warrants followup attention. 5. Cardiomegaly. Coronary artery atherosclerosis. Aortic atherosclerosis. 6. Pulmonary artery enlargement suggests pulmonary arterial hypertension. 7. Hepatic steatosis. A 1.5 cm relatively hyper attenuating left liver lobe "Lesion" is similar to on the prior exam and may represent an area of fat sparing (given lack of hypermetabolism). This also warrants followup attention.    ASSESSMENT: 70 year old Caucasian female  PLAN:  1.  Metastatic breast cancer (ER pos/PR neg/ HER2 neg) with metastasis to bones and lungs, possible right cervical nodes.  -I previously reviewed her restaging PET scan from 02/27/2016 with pt in detailss, which showed minimum hypermetabolism in her diffuse bone metastasis, although some of the lytic bone lesions have increased in size. I have spoken with the radiologist Dr. Weber Cooks who is concerned about multiple myeloma due to the diffuse non-hypermetabolic lytic bone lesions. However she had previous bone biopsy which confirmed metastatic breast cancer. -She has no significant bone pain, giving the PET scan results, I think she is still responding to antiestrogen therapy. I'll continue fulvestrant -dur to the persistent cough, with unknown etiology, possibly related to medication, I will stop her [facility] and Delton See  -I reviewed her restaging CT chest from earlier today, which showed grossly similar bilateral pulmonary nodules, overall stable disease. The scan was limited by motion activity. Scan also showed pulmonary hypertension and cardiomegaly. -  lab reviewed with her,  she has worsening anemia lately, will continue blood transfusion as needed   2. Productive cough and dyspnea  -She has been treated for 2 courses of oral antibiotics, no significant improvement, symptoms has been persistent for 2-3 months now. -I have communicated with her pulmonologist Dr. Halford Chessman who does not think it's COPD, possible postnasal drip versus medication related or cough. I also discussed with her cardiologist Dr. Percival Spanish who does not think it's related to CHF -She also has some choking and coughing with drinking water, she is scheduled to see ENT and will have a swallowing evaluation -will stop her [facility] and Xgeva to see if her cough improves   3. bone metastasis -she has been on Xgeva for one and half years, I will stop temporary due to her persistent cough. -Continue calcium and vitamin D  4. Anemia of chronic disease (CKD and malignancy), and anemia second to Ibrance -- Past work up for reversible causes of anemia were negative.  -Repeated ferritin 185, serum iron 44 (normal) and saturation 16% on 09/15/2015  -She is not taking iron pill, could not tolerate.  -I do not recommend ESA, due to her underline malignancy  -She has worsening anemia lately, has required frequent blood transfusion  -I will check hemolysis labs to ruled out other reason for anemia   5. CHF -she will follow up with Dr. Percival Spanish .  6. Diabetes mellitus, type II.  -not on medication, diet controlled most of time  7. COPD: On albuterol prn. She remains on home oxygen.  8. Chronic renal insufficiency, stage IV   -Stable. -she will follow-up with Dr. Florene Glen  9: Bilateral leg venous stasis ulcers -She was recently discharged from wound clinic  -follow up with PCP   10. Mild leukopenia and thrombocytopenia -Secondary to [facility] continue monitoring.  Plan -I discussed her CT chest scan findings  -continue holding brance and Xgeva, continue fulvestrant injection  for now. -I will see her back in 2 weeks for follow up.  -blood transfusion if Hb<8.0, will arrange 2u RBC in the next few days  I spent 20 minutes counseling the patient face to face. The total time spent in the appointment was 25 minutes.   Truitt Merle  04/22/2016

## 2016-04-22 NOTE — Progress Notes (Signed)
MBSS complete. Full report located under chart review in imaging section. Koleton Duchemin, MA CCC-SLP [phone]  

## 2016-04-22 NOTE — Clinical Social Work Note (Signed)
Clinical Social Work Assessment  Patient Details  Name: Anita [name] MRN: [id number] Date of Birth: Nov 22, 1945  Date of referral:  04/22/16               Reason for consult:  Facility Placement                Permission sought to share information with:  Family Supports Permission granted to share information::  Yes, Verbal Permission Granted  Name::     Tour manager::     Relationship::  daughter  Contact Information:     Housing/Transportation Living arrangements for the past 2 months:  Single Family Home Source of Information:  Patient, Adult Children Patient Interpreter Needed:  None Criminal Activity/Legal Involvement Pertinent to Current Situation/Hospitalization:  No - Comment as needed Significant Relationships:  Adult Children, Spouse Lives with:  Spouse Do you feel safe going back to the place where you live?  Yes Need for family participation in patient care:  No (Coment)  Care giving concerns:  Pt is not at baseline.    Social Worker assessment / plan:  CSW met with pt and pt's daughter, Anita [name] at bedside. Pt alert and oriented and reports she was admitted for pneumonia. She lives with her husband. Family appears to be involved and supportive. Pt shared that she is studying communications at [employer]. She is normally independent with ADLs, besides bathing. She has an aid through [facility] per daughter. CSW discussed PT recommendations for SNF. Pt admits that she is weaker than normal, but is adamantly refusing SNF. She states that she went to SNF in the past and this was not a good experience for her. She is very open to home health services and plans to return home with husband. She is aware to contact CSW if plans change as she will require authorization from insurance for SNF. CSW will sign off, but can be reconsulted if needed.    Employment status:  Retired Nurse, adult PT Recommendations:  [place] /  Referral to community resources:  [place]  Patient/Family's Response to care:  Pt aware of PT recommendations, but refuses SNF and states she will not change her mind.   Patient/Family's Understanding of and Emotional Response to Diagnosis, Current Treatment, and Prognosis:  Pt is aware of admission diagnosis and treatment plan. She is concerned about not being able to go to school this week and is requesting school note. Her daughter also requests a note for work. Notified weekday CSW for follow up with MD.   Emotional Assessment Appearance:  Appears stated age Attitude/Demeanor/Rapport:  Other (Cooperative) Affect (typically observed):  Appropriate Orientation:  Oriented to Place, Oriented to  Time, Oriented to Self, Oriented to Situation Alcohol / Substance use:  Not Applicable Psych involvement (Current and /or in the community):  No (Comment)  Discharge Needs  Concerns to be addressed:  Discharge Planning Concerns Readmission within the last 30 days:  No Current discharge risk:  Physical Impairment Barriers to Discharge:  Continued Medical Work up   Salome Arnt, [place] 04/22/2016, 3:10 PM [phone]

## 2016-04-22 NOTE — Progress Notes (Signed)
PULMONARY / CRITICAL CARE MEDICINE   Name: Anita [name] MRN: [id number] DOB: Jan 01, 1946    ADMISSION DATE:  04/19/2016 CONSULTATION DATE:  04/19/16  REFERRING MD:  EDP / Dr. Fonnie Birkenhead  CHIEF COMPLAINT:  SOB  BRIEF: 70 y/o female with metastatic breast cancer, severe COPD and systolic heart failure admitted on 8/24 with HCAP requriing intubation.   SUBJECTIVE:  Had low BP 98/26 after BB Fluids received, improved  VITAL SIGNS: BP 126/65   Pulse 96   Temp 98.3 F (36.8 C) (Oral)   Resp (!) 23   Ht 5\' 2"  (1.575 m)   Wt 102.1 kg (225 lb 1.4 oz)   SpO2 99%   BMI 41.17 kg/m   HEMODYNAMICS:    VENTILATOR SETTINGS:    INTAKE / OUTPUT: I/O last 3 completed shifts: In: 3716.3 [P.O.:410; I.V.:2131.3; Other:1000; IV Piggyback:175] Out: 2260 [Urine:2260]  PHYSICAL EXAMINATION: General:  Comfortable in bed HENT: jvd increased PULM: coarse mild diffuse, but good air entry CV: RRR, no m GI: BS+, soft, nontender MSK: normal bulk and tone Neuro: Awake and alert, follows commands, nonfocal exam  LABS:  BMET  Recent Labs Lab 04/19/16 1617  04/20/16 0340 04/21/16 0500 04/22/16 0329  NA 141  --  140  --  145  K 3.8  --  4.0  --  4.2  CL 104  --  107  --  113*  CO2 29  --  25  --  24  BUN 40*  --  36*  --  46*  CREATININE 1.65*  < > 1.46* 1.61* 1.50*  GLUCOSE 176*  --  295*  --  164*  < > = values in this interval not displayed.  Electrolytes  Recent Labs Lab 04/19/16 1617 04/19/16 2252 04/20/16 0340 04/22/16 0329  CALCIUM 9.2  --  8.3* 7.8*  MG  --  2.5* 2.5* 2.9*  PHOS  --  6.0* 4.7* 5.6*    CBC  Recent Labs Lab 04/19/16 1617 04/19/16 2252 04/20/16 0340  WBC 9.0 10.1 9.2  HGB 7.8* 7.9* 7.4*  HCT 25.7* 25.8* 23.6*  PLT 213 198 177    Coag's  Recent Labs Lab 04/19/16 2252  APTT 26  INR 1.15    Sepsis Markers  Recent Labs Lab 04/19/16 1655 04/19/16 2014 04/19/16 2252 04/20/16 0340  LATICACIDVEN 1.51 1.14  --   --    PROCALCITON  --   --  1.67 1.82    ABG  Recent Labs Lab 04/19/16 2240 04/20/16 0002 04/20/16 0630  PHART 7.260* 7.380 7.416  PCO2ART 58.1* 42.1 39.7  PO2ART 106* 77.4* 60.7*    Liver Enzymes  Recent Labs Lab 04/19/16 1617  AST 42*  ALT 32  ALKPHOS 133*  BILITOT 0.7  ALBUMIN 2.8*    Cardiac Enzymes  Recent Labs Lab 04/19/16 2252 04/20/16 0340 04/20/16 0920  TROPONINI <0.03 <0.03 <0.03    Glucose  Recent Labs Lab 04/21/16 0402 04/21/16 0739 04/21/16 1130 04/21/16 1534 04/21/16 1938 04/21/16 2350  GLUCAP 190* 179* 211* 156* 116* 163*    Imaging Dg Chest Port 1 View  Result Date: 04/22/2016 CLINICAL DATA:  Pneumonia.  History of breast cancer. EXAM: PORTABLE CHEST 1 VIEW COMPARISON:  04/20/2016 FINDINGS: Cardiomegaly with vascular congestion. Diffuse bilateral interstitial and alveolar opacities, right greater than left, not significantly changed. Suspect small effusions. Interval extubation and removal of NG tube. IMPRESSION: Diffuse bilateral interstitial and alveolar opacities, right greater than left, not significantly changed. This could represent asymmetric edema or  infection. Electronically Signed   By: Rolm Baptise M.D.   On: 04/22/2016 07:11     STUDIES:  CXR 8/24 inc int markings. RLL atelectasis/PNA  CULTURES: Sputum 8/24 > Blood culture 8/24 >  ANTIBIOTICS: Zosyn 8/24 >8/26 Vanc 8/24 >8.26 Ceftriaxone 8/26>>>  SIGNIFICANT EVENTS: 8/24 admit for AoC hypoxemic/hypercapneic resp fx. Intubated after failing bipap 8/25- extubated  LINES/TUBES: ETT 8/24 > 8/25  DISCUSSION: 70 year old female,with significant comorbidities including stage IV breast cancer, severe COPD, systolic CHF with EF 123XX123, chronic hypoxemic respiratory failure on 4 L O2, admitted for acute on chronic dyspnea, failed BiPAP, subsequently intubated.  As of 04/22/2016 improved, passing SBT  ASSESSMENT / PLAN:  PULMONARY A: Acute respirator failure with  hypoxemia COPD, severe, in exacerbation, baseline 4L O2, doesn't take controller meds due to intolerance HCAP Metastatic breast cancer to the lung OSA P:   Supplemental O2, target O2 90-94%, on 5 still liters Continue albuterol See ID qHS CPAP NO more fluid repeat pcxr in am, some increasing infiltrate and fluid fissure rt Would favor neg balance when able to use diuretic , see BP  CARDIOVASCULAR A:  Chronic congestive heart failure, systolic, EF 123XX123. Currently not in exacerbation CAD HTN Dyslipidemia P:  Lasix prefer today Tele Metoprolol and Imdur - dc as hypotension after yesterday and need BP room for diuresis Cont Lipitor  RENAL A:   AKI 2/2 hypovolemia > improved CKD st 3B P:   crt repeat in am  kvo Low threshold start dimadex diuretic once BP rises  GASTROINTESTINAL A:   GERD R/o dysphagia P:   PPI SLP , today Barrium  HEMATOLOGIC A:   Metastatic breast CA. Known to have 5 mm right upper lung nodule, 5 mm left upper lung nodule, multiple lytic lesions in spine/sternum/shoulder. Chest ct scan in 02/2016 > stable mets St III Laryngeal CA Anemia P:  Transfuse for Hb < 7, some dilution likely, repeat cbc in am , diuretic when able Holding off on chemo drugs (she gets 2 shots monthly) F/u with Oncology May need ct chest for nodules  INFECTIOUS A:   HCAP in RLL Culture neg P:   Consider ceftriaxone for total 8 days  ENDOCRINE A:   On steroids P:   CBG q 4 while NPO Sliding scale Reduce steroids in am to daily  NEUROLOGIC A:   Sedation issues Depression Chronic pain P:   Prn fentanyl for pain Add PT, seeing her now  FAMILY  - Updates: to pt  - Inter-disciplinary family meet or Palliative Care meeting due by:  August 31  May be able to send to floor  Lavon Paganini. Titus Mould, MD, [place] Pgr: [facility]

## 2016-04-23 ENCOUNTER — Other Ambulatory Visit: Payer: Commercial Managed Care - HMO

## 2016-04-23 ENCOUNTER — Ambulatory Visit: Payer: Commercial Managed Care - HMO | Admitting: Hematology

## 2016-04-23 ENCOUNTER — Inpatient Hospital Stay (HOSPITAL_COMMUNITY): Payer: Commercial Managed Care - HMO

## 2016-04-23 ENCOUNTER — Telehealth: Payer: Self-pay | Admitting: Hematology

## 2016-04-23 LAB — BASIC METABOLIC PANEL
ANION GAP: 7 (ref 5–15)
BUN: 45 mg/dL — ABNORMAL HIGH (ref 6–20)
CHLORIDE: 113 mmol/L — AB (ref 101–111)
CO2: 28 mmol/L (ref 22–32)
CREATININE: 1.57 mg/dL — AB (ref 0.44–1.00)
Calcium: 8.1 mg/dL — ABNORMAL LOW (ref 8.9–10.3)
GFR calc non Af Amer: 33 mL/min — ABNORMAL LOW (ref >60)
GFR, EST AFRICAN AMERICAN: 38 mL/min — AB (ref >60)
Glucose, Bld: 177 mg/dL — ABNORMAL HIGH (ref 65–99)
POTASSIUM: 4.1 mmol/L (ref 3.5–5.1)
Sodium: 148 mmol/L — ABNORMAL HIGH (ref 135–145)

## 2016-04-23 LAB — CBC
HEMATOCRIT: 27.4 % — AB (ref 36.0–46.0)
HEMOGLOBIN: 8.1 g/dL — AB (ref 12.0–15.0)
MCH: 27.5 pg (ref 26.0–34.0)
MCHC: 29.6 g/dL — AB (ref 30.0–36.0)
MCV: 92.9 fL (ref 78.0–100.0)
Platelets: 231 10*3/uL (ref 150–400)
RBC: 2.95 MIL/uL — ABNORMAL LOW (ref 3.87–5.11)
RDW: 20.8 % — ABNORMAL HIGH (ref 11.5–15.5)
WBC: 11.7 10*3/uL — ABNORMAL HIGH (ref 4.0–10.5)

## 2016-04-23 LAB — GLUCOSE, CAPILLARY
GLUCOSE-CAPILLARY: 173 mg/dL — AB (ref 65–99)
GLUCOSE-CAPILLARY: 174 mg/dL — AB (ref 65–99)
GLUCOSE-CAPILLARY: 239 mg/dL — AB (ref 65–99)
Glucose-Capillary: 159 mg/dL — ABNORMAL HIGH (ref 65–99)
Glucose-Capillary: 150 mg/dL — ABNORMAL HIGH (ref 65–99)
Glucose-Capillary: 208 mg/dL — ABNORMAL HIGH (ref 65–99)
Glucose-Capillary: 280 mg/dL — ABNORMAL HIGH (ref 65–99)
Glucose-Capillary: 206 mg/dL — ABNORMAL HIGH (ref 65–99)

## 2016-04-23 LAB — MAGNESIUM: MAGNESIUM: 2.8 mg/dL — AB (ref 1.7–2.4)

## 2016-04-23 LAB — PHOSPHORUS: Phosphorus: 5.5 mg/dL — ABNORMAL HIGH (ref 2.5–4.6)

## 2016-04-23 MED ORDER — PREDNISONE 20 MG PO TABS
40.0000 mg | ORAL_TABLET | Freq: Every day | ORAL | Status: DC
  Administered 2016-04-24 – 2016-04-26 (×3): 40 mg via ORAL
  Filled 2016-04-23 (×3): qty 2

## 2016-04-23 MED ORDER — HYDROCODONE-ACETAMINOPHEN 5-325 MG PO TABS: 1.0000 | ORAL_TABLET | Freq: Four times a day (QID) | ORAL | Status: DC | PRN

## 2016-04-23 MED ORDER — INSULIN ASPART 100 UNIT/ML ~~LOC~~ SOLN
0.0000 [IU] | Freq: Three times a day (TID) | SUBCUTANEOUS | Status: DC
  Administered 2016-04-23: 3 [IU] via SUBCUTANEOUS
  Administered 2016-04-23: 5 [IU] via SUBCUTANEOUS
  Administered 2016-04-23: 3 [IU] via SUBCUTANEOUS
  Administered 2016-04-24: 1 [IU] via SUBCUTANEOUS
  Administered 2016-04-24: 5 [IU] via SUBCUTANEOUS
  Administered 2016-04-24: 3 [IU] via SUBCUTANEOUS
  Administered 2016-04-24: 7 [IU] via SUBCUTANEOUS
  Administered 2016-04-25: 2 [IU] via SUBCUTANEOUS
  Administered 2016-04-25: 3 [IU] via SUBCUTANEOUS
  Administered 2016-04-25 (×2): 5 [IU] via SUBCUTANEOUS
  Administered 2016-04-26: 3 [IU] via SUBCUTANEOUS
  Administered 2016-04-26: 5 [IU] via SUBCUTANEOUS
  Administered 2016-04-26: 2 [IU] via SUBCUTANEOUS
  Administered 2016-04-26: 7 [IU] via SUBCUTANEOUS
  Administered 2016-04-27: 3 [IU] via SUBCUTANEOUS
  Administered 2016-04-27 (×2): 5 [IU] via SUBCUTANEOUS
  Administered 2016-04-28: 2 [IU] via SUBCUTANEOUS

## 2016-04-23 MED ORDER — PANTOPRAZOLE SODIUM 40 MG PO TBEC
40.0000 mg | DELAYED_RELEASE_TABLET | Freq: Two times a day (BID) | ORAL | Status: DC
  Administered 2016-04-23 – 2016-04-28 (×11): 40 mg via ORAL
  Filled 2016-04-23 (×11): qty 1

## 2016-04-23 NOTE — Progress Notes (Signed)
Pt refused CPAP qhs.  Education provided but adamantly refused stating she would rather wear her nasal cannula and sleep in recliner.  RN aware.  RT will continue to monitor as needed.

## 2016-04-23 NOTE — Progress Notes (Signed)
Speech Language Pathology Treatment: Dysphagia  Patient Details Name: Anita [name] MRN: [id number] DOB: May 16, 1946 Today's Date: 04/23/2016 Time: [id number] SLP Time Calculation (min) (ACUTE ONLY): 25 min  Assessment / Plan / Recommendation Clinical Impression  Pt seen following MBS to assess diet tolerance and understanding of safe swallow precautions. Pt seated in recliner upon SLP arrival. Pt exhibited epistaxis, with several bloody washcloths around her. SLP cleaned pt face with damp washcloth, and noted some blood at the right corner of her mouth. Pt lingual surface was observed, and had evidence of blood on it. RN notified. No further bleeding noted after this.    Pt was observed with honey thick liquids via cup. It was difficult to determine tolerance, as pt exhibits congested nonproductive cough with or without po trial. Pt reports no difficulty swallowing.  Pt was encouraged to follow safe swallow precautions determined at [facility]. These were provided in written form for RN to take to pt new room. ST will continue to follow per POC.    HPI HPI: 70 yo female adm to [facility] with respiratory deficits - requiring intubation.  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    Pt extubated within one day of admit.    MBS completed 04/22/16 with recommendation for dys 3/Honey thick liquid.       SLP Plan  Continue with current plan of care     Recommendations  Diet recommendations: Dysphagia 3 (mechanical soft);Honey-thick liquid Liquids provided via: Cup;No straw;Teaspoon Medication Administration: Whole meds with puree Supervision: Patient able to self feed;Intermittent supervision to cue for compensatory strategies Compensations: Small sips/bites;Slow rate;Minimize environmental distractions (take rest breaks if SOB increases) Postural Changes and/or Swallow Maneuvers: Seated upright 90 degrees      Oral  Care Recommendations: Oral care QID Follow up Recommendations: Skilled Nursing facility Plan: Continue with current plan of care     Shonna Chock 04/23/2016, 2:26 PM  Tiffaney Heimann B. Quentin O[facility], [place] [phone]

## 2016-04-23 NOTE — Progress Notes (Signed)
Goals of Care    Had an excellent d/w Anita [name] about her current condition. We discussed that the reason for her acute illness is very likely due to aspiration, and that this combined with her COPD, heart failure and underlying malignancy is a very poor prognosis. We discussed my concern that this will almost certainly happen again and the risk of this being a recurrent problem for the rest of her life is very high. I also note Dr Ernestina Penna note that states she is not likely a candidate for further Chemo. Anita [name] does not care for the ventilator. I do not believe she wants this again, however during our discussion she made several contradicting statements such as: "I don't want to suffer" but then after said "I want the machine to live". She then asked me my advise. I shared w/ her a reasonable approach would be should she be re-admitted: Oxygen, BIPAP, hydration and abx if needed, but NOT CPR or mechanical ventilation. She seems to support this, however because she seemed to waver I felt it best to d/w her daughter [name] about goals of care.   I called Anita [name]. I reviewed all of the above concerns. She stated "so you want me to make her a DNR". She further stated that "they tried to get [place] to do this 3 years ago. We said no,  and got 3 years out of her". She did acknowledge that things are "more critical now". She told me she needed to speak with the rest of her family. She said she would call the unit and ask for me. I have encouraged her to have more discussion and am certainly willing to discuss this further. At the time being she is Full Code. However I strongly recommend care limitations. I think that this will happen again, and that we will be having the same discussion in the near future. Will ask palliative to get involved as well. Suspect that they may not be ready to define this line; but we have at least started to conversation.  Erick Colace ACNP-BC [place] Pager  # [phone] OR # [phone] if no answer

## 2016-04-23 NOTE — Progress Notes (Signed)
PULMONARY / CRITICAL CARE MEDICINE   Name: Anita [name] MRN: [id number] DOB: May 02, 1946    ADMISSION DATE:  04/19/2016 CONSULTATION DATE:  04/19/16  REFERRING MD:  EDP / Dr. Fonnie Birkenhead  CHIEF COMPLAINT:  SOB  BRIEF: 70 y/o female with metastatic breast cancer, severe COPD and systolic heart failure admitted on 8/24 with HCAP requriing intubation.   SUBJECTIVE:  Says she is breathing a little better.  No distress. Still desaturates w/ activity   VITAL SIGNS: BP (!) 102/58   Pulse 87   Temp 97.7 F (36.5 C) (Oral)   Resp (!) 24   Ht '5\' 2"'$  (1.575 m)   Wt 227 lb 11.8 oz (103.3 kg)   SpO2 97%   BMI 41.65 kg/m  4 liters     INTAKE / OUTPUT:  Intake/Output Summary (Last 24 hours) at 04/23/16 0850 Last data filed at 04/23/16 0600  Gross per 24 hour  Intake           270.67 ml  Output             2550 ml  Net         -2279.33 ml    PHYSICAL EXAMINATION: General:  Comfortable up in chair HENT: no sig JVD, has very hoarse phonation, course raspy voice w/ loose rhonchus upper airway noises.  PULM: good air entry, some accessory muscle use AFTER mobilization, crackles both bases posteriorly.  CV: RRR, no murmur, LE edema, scattered areas of ecchymosis  GI: BS+, soft, nontender MSK: normal bulk and tone Neuro: Awake and alert, follows commands, nonfocal exam   LABS:  BMET  Recent Labs Lab 04/20/16 0340 04/21/16 0500 04/22/16 0329 04/23/16 0320  NA 140  --  145 148*  K 4.0  --  4.2 4.1  CL 107  --  113* 113*  CO2 25  --  24 28  BUN 36*  --  46* 45*  CREATININE 1.46* 1.61* 1.50* 1.57*  GLUCOSE 295*  --  164* 177*    Electrolytes  Recent Labs Lab 04/20/16 0340 04/22/16 0329 04/23/16 0320  CALCIUM 8.3* 7.8* 8.1*  MG 2.5* 2.9* 2.8*  PHOS 4.7* 5.6* 5.5*    CBC  Recent Labs Lab 04/19/16 2252 04/20/16 0340 04/23/16 0320  WBC 10.1 9.2 11.7*  HGB 7.9* 7.4* 8.1*  HCT 25.8* 23.6* 27.4*  PLT 198 177 231    Coag's  Recent Labs Lab  04/19/16 2252  APTT 26  INR 1.15    Sepsis Markers  Recent Labs Lab 04/19/16 1655 04/19/16 2014 04/19/16 2252 04/20/16 0340  LATICACIDVEN 1.51 1.14  --   --   PROCALCITON  --   --  1.67 1.82    ABG  Recent Labs Lab 04/19/16 2240 04/20/16 0002 04/20/16 0630  PHART 7.260* 7.380 7.416  PCO2ART 58.1* 42.1 39.7  PO2ART 106* 77.4* 60.7*    Liver Enzymes  Recent Labs Lab 04/19/16 1617  AST 42*  ALT 32  ALKPHOS 133*  BILITOT 0.7  ALBUMIN 2.8*    Cardiac Enzymes  Recent Labs Lab 04/19/16 2252 04/20/16 0340 04/20/16 0920  TROPONINI <0.03 <0.03 <0.03    Glucose  Recent Labs Lab 04/21/16 2350 04/22/16 1620 04/22/16 2033 04/22/16 2336 04/23/16 0526 04/23/16 0826  GLUCAP 163* 163* 248* 196* 150* 208*    Imaging Dg Chest Port 1 View  Result Date: 04/23/2016 CLINICAL DATA:  Pneumonia. EXAM: PORTABLE CHEST 1 VIEW COMPARISON:  04/22/2016. FINDINGS: Cardiomegaly with progressive bilateral pulmonary infiltrates and small pleural effusions.  Findings suggest congestive heart failure. Bilateral pneumonia cannot be excluded. No pneumothorax. Old left posterior seventh rib fracture. IMPRESSION: Cardiomegaly with progressive bilateral pulmonary infiltrates and small pleural effusions. Findings suggest congestive heart failure. Bilateral pneumonia cannot be excluded. Electronically Signed   By: Marcello Moores  Register   On: 04/23/2016 06:49   Dg Swallowing Func-speech Pathology  Result Date: 04/22/2016 Objective Swallowing Evaluation: Type of Study: MBS-Modified Barium Swallow Study [name] Details Name: Anita [name] MRN: [id number] Date of Birth: 05-29-1946 Today's Date: 04/22/2016 Time: SLP Start Time (ACUTE ONLY): 1340-SLP Stop Time (ACUTE ONLY): 1415 SLP Time Calculation (min) (ACUTE ONLY): 35 min Past Medical History: Past Medical History: Diagnosis Date . Anemia   a. Adm 09/2012 with melena, Hgb 5.8 -> transfused. EGD/colonoscopy unrevealing. . Anemia of chronic disease  .  Breast cancer ([place])   a. Mets to bone. ER 100%/ PR 0%/Her2 neu negative. [name] CAD (coronary artery disease)   a. 08/2012 NSTEMI: Med Rx; b. 10/2012 MV: EF 49%, old inf infarct;  c. 02/2013 NSTEMI-> Med Rx; d. 06/2013 Cath: LM nl, LAD 40p, 80-85p/m, D1 nl, LCX 20p, OM1 nl, RCA 100d w/ L->R collats, EF 30-35%, basal to mid inf AK, ant HK. . Chronic diastolic CHF (congestive heart failure) ([place])   a. 01/2014 Echo: EF 55%, triv PR. [name] Chronic respiratory failure ([place])   a. On O2 qhs and w/ exertion. 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 CKD (chronic kidney disease), stage IV ([place])  . COPD (chronic obstructive pulmonary disease) ([place])   a. Uses Home O2 w/ exertion. . Dementia   very mild . Depression  . Diabetes mellitus without complication ([facility])  . Fibroid  . History of tobacco abuse   a. Quit 2014. [name] Hyperlipidemia 02/11/2013 . Lesion of vocal cord   a. CT 05/2013 concerning for tumor. . Morbid obesity ([place])  . Radiation 02/02/14-02/24/14  left and right femur 30 gray . Ulcers of both lower legs [facility])  Past Surgical History: Past Surgical History: Procedure Laterality Date . COLONOSCOPY, ESOPHAGOGASTRODUODENOSCOPY (EGD) AND ESOPHAGEAL DILATION N/A 10/13/2012  Procedure: colonoscopy and egd;  Surgeon: Wonda Horner, MD;  Location: [facility];  Service: Endoscopy;  Laterality: N/A; . LEFT HEART CATHETERIZATION WITH CORONARY ANGIOGRAM N/A 07/07/2013  Procedure: LEFT HEART CATHETERIZATION WITH CORONARY ANGIOGRAM;  Surgeon: Hannie Shoe M [place], MD;  Location: [facility] C[facility] LAB;  Service: Cardiovascular;  Laterality: N/A; HPI: 70 yo female adm to [facility] with respiratory deficits - requiring intubation.  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    Pt extubated within one day of admit.     Subjective: pt awake Assessment / Plan / Recommendation CHL IP CLINICAL IMPRESSIONS 04/22/2016 Therapy Diagnosis Severe pharyngeal  phase dysphagia;Moderate pharyngeal phase dysphagia Clinical Impression Pt demonstrates a moderate impairment due to delay in swallow trigger. Pt consistently aspirated thin liquids before the swallow regardless of bolus size or chin tuck.  Sensation was slightly delayed, as liquids hit the mid-upper trachea. Penetration events were not sensed. Suspect sensory impairment in the base of tongue and laryngeal vestibule and glottis impacting function, as well as compromised respiration; pt was quite fatigued after efforts to transfer to the chair during the test. Recommend dys 3 (mechanical soft) solids with honey thick liquids, though expect pt could tolerate nectar thick liquids with small sips when respiratory function improves. Will follow for tolerance and  progress.  Impact on safety and function Moderate aspiration risk   CHL IP TREATMENT RECOMMENDATION 04/22/2016 Treatment Recommendations Therapy as outlined in treatment plan below   Prognosis 04/22/2016 Prognosis for Safe Diet Advancement Good Barriers to Reach Goals -- Barriers/Prognosis Comment -- CHL IP DIET RECOMMENDATION 04/22/2016 SLP Diet Recommendations Dysphagia 3 (Mech soft) solids;Honey thick liquids Liquid Administration via Cup;Spoon Medication Administration Whole meds with puree Compensations Small sips/bites;Slow rate Postural Changes Seated upright at 90 degrees   CHL IP OTHER RECOMMENDATIONS 04/22/2016 Recommended Consults -- Oral Care Recommendations Oral care BID Other Recommendations Order thickener from pharmacy   CHL IP FOLLOW UP RECOMMENDATIONS 04/22/2016 Follow up Recommendations Skilled Nursing facility   [facility] IP FREQUENCY AND DURATION 04/22/2016 Speech Therapy Frequency (ACUTE ONLY) min 2x/week Treatment Duration 2 weeks      CHL IP ORAL PHASE 04/22/2016 Oral Phase Impaired Oral - Pudding Teaspoon -- Oral - Pudding Cup -- Oral - Honey Teaspoon -- Oral - Honey Cup -- Oral - Nectar Teaspoon -- Oral - Nectar Cup -- Oral - Nectar Straw -- Oral -  Thin Teaspoon -- Oral - Thin Cup -- Oral - Thin Straw -- Oral - Puree -- Oral - Mech Soft -- Oral - Regular -- Oral - Multi-Consistency -- Oral - Pill -- Oral Phase - Comment mild impairment due to missing dentition, thrusting movement with solids  CHL IP PHARYNGEAL PHASE 04/22/2016 Pharyngeal Phase Impaired Pharyngeal- Pudding Teaspoon -- Pharyngeal -- Pharyngeal- Pudding Cup -- Pharyngeal -- Pharyngeal- Honey Teaspoon -- Pharyngeal -- Pharyngeal- Honey Cup Delayed swallow initiation-vallecula Pharyngeal -- Pharyngeal- Nectar Teaspoon -- Pharyngeal -- Pharyngeal- Nectar Cup Delayed swallow initiation-vallecula;Delayed swallow initiation-pyriform sinuses;Penetration/Aspiration before swallow;Trace aspiration Pharyngeal Material enters airway, passes BELOW cords without attempt by [name] to eject out (silent aspiration);Material does not enter airway Pharyngeal- Nectar Straw -- Pharyngeal -- Pharyngeal- Thin Teaspoon Delayed swallow initiation-pyriform sinuses;Penetration/Aspiration before swallow Pharyngeal Material enters airway, passes BELOW cords and not ejected out despite cough attempt by [name] Pharyngeal- Thin Cup Delayed swallow initiation-pyriform sinuses;Penetration/Aspiration before swallow;Moderate aspiration Pharyngeal Material enters airway, passes BELOW cords and not ejected out despite cough attempt by [name] Pharyngeal- Thin Straw Delayed swallow initiation-pyriform sinuses;Significant aspiration (Amount);Penetration/Aspiration before swallow;Compensatory strategies attempted (with notebox) Pharyngeal Material enters airway, passes BELOW cords and not ejected out despite cough attempt by [name] Pharyngeal- Puree Delayed swallow initiation-vallecula Pharyngeal -- Pharyngeal- Mechanical Soft Delayed swallow initiation-vallecula Pharyngeal -- Pharyngeal- Regular -- Pharyngeal -- Pharyngeal- Multi-consistency -- Pharyngeal -- Pharyngeal- Pill Delayed swallow initiation-vallecula Pharyngeal --  Pharyngeal Comment --  No flowsheet data found. No flowsheet data found. Herbie [place], [place] [phone] Othelia Pulling Katherene Ponto 04/22/2016, 2:37 PM              8/28 CXR aeration a little worse w/ bilateral airspace disease and effusion   STUDIES:  CXR 8/24 inc int markings. RLL atelectasis/PNA  CULTURES: Sputum 8/24 > Blood culture 8/24 >  ANTIBIOTICS: Zosyn 8/24 >8/26 Vanc 8/24 >8.26 Ceftriaxone 8/26>>>  SIGNIFICANT EVENTS: 8/24 admit for AoC hypoxemic/hypercapneic resp fx. Intubated after failing bipap 8/25- extubated  LINES/TUBES: ETT 8/24 > 8/25   ASSESSMENT / PLAN:   Acute on Chronic respiratory failure with hypoxemia in setting of what is likely recurrent aspiration complicated by AECOPD and element of pulmonary edema  baseline 4L O2, doesn't take controller meds due to intolerance Metastatic breast cancer to the lung OSA Plan:   Supplemental O2, target O2 90-94%,  Aspiration precautions Continue albuterol Taper steroids Cont home demadex qHS CPAP Day 4/7 abx (now  rocephin)  Chronic congestive heart failure, systolic, EF 32-44%. Currently not in exacerbation CAD HTN Dyslipidemia Plan:  Cont Lipitor Daily assessment of volume status to help determine diuretic regimen for home  AKI 2/2 hypovolemia (had improved. Creatinine bumped again after lasix) CKD st 3B Hypernatremia  Plan:   Allow Na levels to correct w/ water intake Will cont home demadex, although may need to be held if creatinine continues to rise.   Severe dysphagia  GERD Plan:   PPI dysph 3 diet w/ honey thick liquids    Metastatic breast CA. Known to have 5 mm right upper lung nodule, 5 mm left upper lung nodule, multiple lytic lesions in spine/sternum/shoulder. Chest ct scan in 02/2016 > stable mets St III Laryngeal CA Anemia Plan:  Transfuse for Hb < 7, some dilution likely, repeat cbc in am  Holding off on chemo drugs (she gets 2 shots monthly) F/u with Oncology-->review of  Dr Ernestina Penna notes note she is poor candidate for further chemo    Steroid induced hyperglycemia  Plan   Sliding scale Reduce steroids w/ goal to taper off over 7d  Depression Chronic pain Plan Cont PT  On dilaudid at home-->we can resume this   DISCUSSION: 70 year old female,with significant comorbidities including stage IV breast cancer, severe COPD, systolic CHF with EF 01-02%, chronic hypoxemic respiratory failure on 4 L O2, admitted for acute on chronic dyspnea, failed BiPAP, subsequently intubated. She remains extubated since 8/25. Her respiratory status remains marginal. Given her swallowing test and limited radiographic change following diuretic challenge I think a significant component of her decompensation is recurrent aspiration. Will cont to push rehab efforts, for today goal is even f (to slightly negative) fluid balance, will need to continue aspiration and dysphagia precautions. She can move out of the ICU. Will ask triad to assume care. She really needs goals of care discussion as she has stage 4 cancer and severe dysphagia. I suspect that it will not be long before she has a recurrent episode of respiratory failure.    Erick Colace ACNP-BC [place] Pager # [phone] OR # [phone] if no answer

## 2016-04-23 NOTE — Progress Notes (Signed)
Received patient from [place] obtained, has many open scratch areas that patient is scratching open, Allevyn and band aids intact, oriented to unit, call light placed in reach

## 2016-04-23 NOTE — Telephone Encounter (Signed)
PATIENT CALLED TO [facility] APPT TO LATER DATE. 04/23/16

## 2016-04-23 NOTE — Progress Notes (Addendum)
Physical Therapy Treatment Patient Details Name: Anita [name] MRN: [id number] DOB: 1946-06-15 Today's Date: 04/23/2016    History of Present Illness 70 y/o female with metastatic breast cancer, severe COPD and systolic heart failure admitted on 8/24 with HCAP requriing intubation.     PT Comments    Patient states" I am going to school tomorrow. My husband will take me in a WC." .  The patient is moving well, limited by cardio/pulmonary status. Patient declines SNF. Continue PT while in acute care. RECOMMEND OT.CONSULT.  Follow Up Recommendations  SNF;Supervision/Assistance - 24 hour (per notes, patient declining SNF.)     Equipment Recommendations  None recommended by PT    Recommendations for Other Services  PLEASE ORDER OT     Precautions / Restrictions Precautions Precautions: Fall Precaution Comments: oxygen, monitor VS    Mobility  Bed Mobility               General bed mobility comments: in recliner  Transfers Overall transfer level: Needs assistance Equipment used: Rolling walker (2 wheeled) Transfers: Sit to/from Stand Sit to Stand: Min assist         General transfer comment: moves quickly, cues for safety. HR 121 prior to mobility. Encouraged patient to slow down,.   Ambulation/Gait Ambulation/Gait assistance: Min assist;+2 safety/equipment Ambulation Distance (Feet): 50 Feet (10' to [facility].) Assistive device: Rolling walker (2 wheeled) Gait Pattern/deviations: Step-through pattern     General Gait Details: patient moves quickly, multimaodal cues to slow down, practice pursed lip breathing. HR 121-147, sats 95%- 83% with activity. RR 33. patient on 4 liters [place]. Stopped x 3 while ambu;ating to encourage patient to PLB and slow down. sats returned to 93% with HR a20 after resting in recliner.   Stairs            Wheelchair Mobility    Modified Rankin (Stroke Patients Only)       Balance   Sitting-balance support: Feet  supported;Bilateral upper extremity supported Sitting balance-Leahy Scale: Good     Standing balance support: During functional activity;Bilateral upper extremity supported Standing balance-Leahy Scale: Fair                      Cognition Arousal/Alertness: Awake/alert Behavior During Therapy: Impulsive Overall Cognitive Status: No family/caregiver present to determine baseline cognitive functioning                      Exercises      General Comments        Pertinent Vitals/Pain Pain Assessment: No/denies pain    Home Living                      Prior Function            PT Goals (current goals can now be found in the care plan section) Progress towards PT goals: Progressing toward goals    Frequency  Min 3X/week    PT Plan Current plan remains appropriate    Co-evaluation             End of Session Equipment Utilized During Treatment: Gait belt Activity Tolerance: Treatment limited secondary to medical complications (Comment) Patient left: in chair;with call bell/phone within reach     Time: 0837-0909 PT Time Calculation (min) (ACUTE ONLY): 32 min  Charges:  $Gait Training: 23-37 mins                    G  Codes:      Claretha Cooper 04/23/2016, 9:49 AM Tresa Endo PT [phone]

## 2016-04-24 DIAGNOSIS — N183 Chronic kidney disease, stage 3 unspecified: Secondary | ICD-10-CM | POA: Diagnosis present

## 2016-04-24 DIAGNOSIS — E870 Hyperosmolality and hypernatremia: Secondary | ICD-10-CM | POA: Diagnosis present

## 2016-04-24 DIAGNOSIS — N179 Acute kidney failure, unspecified: Secondary | ICD-10-CM | POA: Diagnosis present

## 2016-04-24 DIAGNOSIS — J9622 Acute and chronic respiratory failure with hypercapnia: Secondary | ICD-10-CM

## 2016-04-24 DIAGNOSIS — C799 Secondary malignant neoplasm of unspecified site: Secondary | ICD-10-CM

## 2016-04-24 DIAGNOSIS — Z515 Encounter for palliative care: Secondary | ICD-10-CM

## 2016-04-24 DIAGNOSIS — R1310 Dysphagia, unspecified: Secondary | ICD-10-CM

## 2016-04-24 DIAGNOSIS — Z7189 Other specified counseling: Secondary | ICD-10-CM

## 2016-04-24 DIAGNOSIS — E876 Hypokalemia: Secondary | ICD-10-CM | POA: Diagnosis not present

## 2016-04-24 DIAGNOSIS — J9621 Acute and chronic respiratory failure with hypoxia: Secondary | ICD-10-CM | POA: Diagnosis present

## 2016-04-24 LAB — BASIC METABOLIC PANEL
Anion gap: 10 (ref 5–15)
BUN: 41 mg/dL — AB (ref 6–20)
CHLORIDE: 110 mmol/L (ref 101–111)
CO2: 29 mmol/L (ref 22–32)
CREATININE: 1.35 mg/dL — AB (ref 0.44–1.00)
Calcium: 8.4 mg/dL — ABNORMAL LOW (ref 8.9–10.3)
GFR calc Af Amer: 46 mL/min — ABNORMAL LOW (ref >60)
GFR calc non Af Amer: 39 mL/min — ABNORMAL LOW (ref >60)
Glucose, Bld: 134 mg/dL — ABNORMAL HIGH (ref 65–99)
POTASSIUM: 3.2 mmol/L — AB (ref 3.5–5.1)
Sodium: 149 mmol/L — ABNORMAL HIGH (ref 135–145)

## 2016-04-24 LAB — CULTURE, BLOOD (ROUTINE X 2)
Culture: NO GROWTH
Culture: NO GROWTH

## 2016-04-24 LAB — GLUCOSE, CAPILLARY
GLUCOSE-CAPILLARY: 298 mg/dL — AB (ref 65–99)
Glucose-Capillary: 133 mg/dL — ABNORMAL HIGH (ref 65–99)
Glucose-Capillary: 310 mg/dL — ABNORMAL HIGH (ref 65–99)
Glucose-Capillary: 219 mg/dL — ABNORMAL HIGH (ref 65–99)

## 2016-04-24 MED ORDER — IPRATROPIUM-ALBUTEROL 0.5-2.5 (3) MG/3ML IN SOLN
3.0000 mL | Freq: Three times a day (TID) | RESPIRATORY_TRACT | Status: DC
  Administered 2016-04-24 – 2016-04-25 (×4): 3 mL via RESPIRATORY_TRACT
  Filled 2016-04-24 (×3): qty 3

## 2016-04-24 MED ORDER — POTASSIUM CHLORIDE CRYS ER 20 MEQ PO TBCR
40.0000 meq | EXTENDED_RELEASE_TABLET | Freq: Once | ORAL | Status: AC
  Administered 2016-04-24: 40 meq via ORAL
  Filled 2016-04-24: qty 2

## 2016-04-24 NOTE — Consult Note (Signed)
Consultation Note Date: 04/24/2016   Patient Name: Anita [name]  DOB: 03-07-1946  MRN: [id number]  Age / Sex: 70 y.o., female  PCP: Nicolette Bang, DO Referring Physician: Louellen Molder, MD  Reason for Consultation: Establishing goals of care  HPI/Patient Profile: 70 y.o. female  with past medical history of stage IV breast cancer (mets lung, bone, cervical nodes), chronic hypoxemic resp failure, severe COPD, systolic CHF EF 62-03%, hypertension who presents 04/19/2016 with shortness of breath. Patient states over last few weeks she's had progressively worsening shortness of breath, cough, weakness. She was at school [facility] when EMS was called d/t SOB. Failed trial of BiPAP and required intubation. Found to have HCAP likely r/t aspiration (could be causing cough) and placed on dysphagia III, honey thick diet.   Clinical Assessment and Goals of Care: I met today with Anita [name]. She has fairly good insight and tells me that her daughter, Anita [name], is her HCPOA. "She will never agree to sign a DNR." Anita [name] says that she desires aggressive care "but when there is not hope [and shakes her head no]." She does say that does not want to be a vegetable. She says "[name] does not need me yet" and does not feel like her time is very limited as of now. 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   I explain the natural trajectory of dysphagia, especially on top of heart failure, COPD, and cancer that has spread to her lungs. She understands that these are progressive diseases that will only get worse and complicate one another. I explain that the concern if with her new aspiration risk and that this risk is not zero with the suggested diet and that this could lead to decline quickly as it likely has this hospitalization.   She is  considering her options with CPR and intubation. She does not seem inclined towards these interventions - explained the meaning of DNR. She is unsure what she desires. However, when we discuss dying she says she is scared - seems to be scared of suffering and does not wish to suffer. She describes that she would hope for a quick death in her sleep. "If no hope" she would want to be kept comfortable. Would not want tracheostomy. Wishes to discuss more with myself and daughter - will try and arrange meeting time.   HCPOA daughter Anita [name]    SUMMARY OF RECOMMENDATIONS   - No tracheostomy. Does not want to be a "vegetable." Does not want to suffer at EOL.  - Unsure about her desire for code status and re-intubation.   Code Status/Advance Care Planning:  Full code until she decides otherwise   Symptom Management:   Mucous/congestion: Continue guaifenesin.   Palliative Prophylaxis:   Aspiration, Delirium Protocol and Frequent Pain Assessment  Additional Recommendations (Limitations, Scope, Preferences):  No Tracheostomy  Psycho-social/Spiritual:   Desire for further Chaplaincy support:no  Additional Recommendations: Caregiving  Support/Resources  Prognosis:  Difficult to say but with multiple comorbitities prognosis very poor and likely < 6 months.   Discharge Planning: Home with Home Health      Primary Diagnoses: Present on Admission: . Acute on chronic respiratory failure with hypercapnia ([place])   I have reviewed the medical record, interviewed the patient and family, and examined the patient. The following aspects are pertinent.  Past Medical History:  Diagnosis Date  . Anemia    a. Adm 09/2012 with melena, Hgb 5.8 -> transfused. EGD/colonoscopy unrevealing.  . Anemia of chronic disease   . Breast cancer ([place])    a. Mets to bone. ER 100%/ PR 0%/Her2 neu negative.  [name] CAD (coronary artery disease)    a. 08/2012 NSTEMI: Med Rx; b. 10/2012 MV: EF 49%, old inf  infarct;  c. 02/2013 NSTEMI-> Med Rx; d. 06/2013 Cath: LM nl, LAD 40p, 80-85p/m, D1 nl, LCX 20p, OM1 nl, RCA 100d w/ L->R collats, EF 30-35%, basal to mid inf AK, ant HK.  . Chronic diastolic CHF (congestive heart failure) ([place])    a. 01/2014 Echo: EF 55%, triv PR.  [name] Chronic respiratory failure ([place])    a. On O2 qhs and w/ exertion.  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 CKD (chronic kidney disease), stage IV ([place])   . COPD (chronic obstructive pulmonary disease) ([place])    a. Uses Home O2 w/ exertion.  . Dementia    very mild  . Depression   . Diabetes mellitus without complication ([place])   . Fibroid   . History of tobacco abuse    a. Quit 2014.  [name] Hyperlipidemia 02/11/2013  . Lesion of vocal cord    a. CT 05/2013 concerning for tumor.  . Morbid obesity ([place])   . Radiation 02/02/14-02/24/14   left and right femur 30 gray  . Ulcers of both lower legs [facility])    Social History   Social History  . Marital status: Widowed    Spouse name: N/A  . Number of children: 2  . Years of education: N/A   Occupational History  . retired Energy manager    Social History Main Topics  . Smoking status: Former Smoker    Packs/day: 2.00    Years: 50.00    Types: Cigarettes    Start date: 08/27/1961    Quit date: 09/24/2012  . Smokeless tobacco: Never Used     Comment: Quit when COPD dx  . Alcohol use No  . Drug use: No  . Sexual activity: No   Other Topics Concern  . None   Social History Narrative   Lives in [place] with husband Anita [name] [phone]; Daughter who is Anita [name] is [employer] [phone]. Patient in school at [employer] for counseling degree.       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 States she wants everything done to keep her alive, including ventilator and feeding tube. Would want to remain on  these long term even if she was unable to live coming off these type of support.       Made it very clear that she would not want to go to any type of ALF or SNF. States that her HCPOA is aware of these wishes.    Family  History  Problem Relation Age of Onset  . Cancer Mother     leukemia  . Hypertension Mother   . Diabetes Father   . Heart disease Father     passed away due to heart attack   Scheduled Meds: . acetaminophen  1,000 mg Oral Once  . budesonide (PULMICORT) nebulizer solution  0.5 mg Nebulization BID  . cefTRIAXone (ROCEPHIN)  IV  2 g Intravenous Q24H  . chlorhexidine  15 mL Mouth Rinse BID  . Chlorhexidine Gluconate Cloth  6 each Topical Q0600  . clopidogrel  75 mg Oral Daily  . guaiFENesin  5 mL Oral QID  . heparin  5,000 Units Subcutaneous Q8H  . insulin aspart  0-9 Units Subcutaneous TID AC & HS  . ipratropium-albuterol  3 mL Nebulization TID  . mouth rinse  15 mL Mouth Rinse q12n4p  . mirtazapine  15 mg Oral QHS  . mupirocin ointment  1 application Nasal BID  . pantoprazole  40 mg Oral BID  . predniSONE  40 mg Oral Q breakfast  . torsemide  40 mg Oral BID   Continuous Infusions:  PRN Meds:.albuterol, HYDROcodone-acetaminophen, RESOURCE THICKENUP CLEAR Medications Prior to Admission:  Prior to Admission medications   Medication Sig Start Date End Date Taking? Authorizing Provider  albuterol (PROVENTIL HFA;VENTOLIN HFA) 108 (90 BASE) MCG/ACT inhaler Inhale 2 puffs into the lungs every 6 (six) hours as needed for wheezing or shortness of breath. 12/08/14  Yes Chesley Mires, MD  Ascorbic Acid (VITAMIN C PO) Take 1 tablet by mouth daily.   Yes Historical Provider, MD  aspirin EC 81 MG EC tablet Take 1 tablet (81 mg total) by mouth daily. 10/02/12  Yes Leone Haven, MD  atorvastatin (LIPITOR) 40 MG tablet TAKE 1 TABLET EVERY DAY 01/04/16  Yes Nicolette Bang, DO  Cetirizine HCl 10 MG CAPS Take 1 capsule (10 mg total) by mouth daily. 04/05/16  Yes Nicolette Bang, DO  clopidogrel (PLAVIX) 75 MG tablet Take 1 tablet (75 mg total) by mouth daily. 03/08/16  Yes Nicolette Bang, DO  denosumab (XGEVA) 120 MG/1.7ML SOLN injection Inject 120 mg into the skin once. Monthly at [facility]   Yes Historical Provider, MD  fulvestrant (FASLODEX) 250 MG/5ML injection Inject 250 mg into the muscle every 30 (thirty) days. Cancer center   Yes Historical Provider, MD  HYDROcodone-acetaminophen (NORCO/VICODIN) 5-325 MG tablet Take 1 tablet by mouth every 6 (six) hours as needed for moderate pain. 02/20/16  Yes Truitt Merle, MD  hydrocortisone 2.5 % ointment Apply topically 2 (two) times daily. 04/05/16  Yes Nicolette Bang, DO  isosorbide mononitrate (IMDUR) 60 MG 24 hr tablet Take 1 tablet (60 mg total) by mouth daily. 12/01/15  Yes Nicolette Bang, DO  NON FORMULARY Place 3 L into the nose as needed (oxygen).    Yes Historical Provider, MD  omeprazole (PRILOSEC) 40 MG capsule Take 1 capsule (40 mg total) by mouth daily. 04/05/16  Yes Nicolette Bang, DO  potassium chloride SA (K-DUR,KLOR-CON) 20 MEQ tablet Take 2 tablets (40 mEq total) by mouth 2 (two) times daily. 12/01/15  Yes Nicolette Bang, DO  torsemide (DEMADEX) 20 MG tablet TAKE 2 TABLETS TWICE DAILY 04/05/16  Yes Nicolette Bang, DO  VITAMIN D, CHOLECALCIFEROL, PO Take 1,000 mg by mouth daily.    Yes Historical Provider, MD  acetaminophen (TYLENOL) 325 MG tablet Take 2 tablets (650 mg total) by mouth every 4 (four) hours as needed for headache  or mild pain. Patient not taking: Reported on 04/19/2016 02/03/16   Rogue Bussing, MD  albuterol (PROVENTIL) (2.5 MG/3ML) 0.083% nebulizer solution Inhale 3 mLs into the lungs every 6 (six) hours as needed for shortness of breath or wheezing. 02/27/16   Historical Provider, MD  bag balm OINT ointment Apply 1 g topically as needed for dry skin or irritation. 12/03/14   Coral Spikes, DO  clotrimazole (LOTRIMIN) 1 % cream Apply 1  application topically daily. Patient not taking: Reported on 04/19/2016 09/12/15   Minus Breeding, MD  fluticasone [facility]) 50 MCG/ACT nasal spray Place 2 sprays into both nostrils daily. Patient taking differently: Place 2 sprays into both nostrils daily as needed for allergies.  03/08/16   Nicolette Bang, DO  HYDROmorphone (DILAUDID) 2 MG tablet Take 1 tablet (2 mg total) by mouth every 4 (four) hours as needed for severe pain. Patient not taking: Reported on 04/19/2016 02/03/16   Rogue Bussing, MD  metoprolol tartrate (LOPRESSOR) 25 MG tablet Take 1 tablet (25 mg total) by mouth 2 (two) times daily. Patient not taking: Reported on 04/19/2016 03/08/16   Nicolette Bang, DO  mirtazapine (REMERON) 15 MG tablet Take 1 tablet (15 mg total) by mouth at bedtime. Patient not taking: Reported on 04/19/2016 12/01/15   Nicolette Bang, DO  nitroGLYCERIN (NITROSTAT) 0.4 MG SL tablet Place 1 tablet (0.4 mg total) under the tongue every 5 (five) minutes as needed for chest pain. 03/08/16   Nicolette Bang, DO  palbociclib [facility]) 100 MG capsule Take 1 capsule (100 mg total) by mouth daily with breakfast. Take whole with food. Patient not taking: Reported on 03/23/2016 10/21/15   Truitt Merle, MD   Allergies  Allergen Reactions  . Omnipaque [Iohexol] Hives and Other (See Comments)    Pt claims she developed hives after given contrast  . Benzene Rash   Review of Systems  Constitutional: Positive for activity change. Negative for appetite change.  HENT: Positive for voice change.   Respiratory: Positive for shortness of breath.   Neurological: Positive for weakness.    Physical Exam  Constitutional: She is oriented to person, place, and time. She appears well-developed and well-nourished.  HENT:  Head: Normocephalic and atraumatic.  Cardiovascular: Normal rate.   Pulmonary/Chest: No accessory muscle usage. No tachypnea. She is in respiratory distress.  Mod distress  with conversation  Neurological: She is alert and oriented to person, place, and time.  Nursing note and vitals reviewed.   Vital Signs: BP (!) 125/59 (BP Location: Left Wrist)   Pulse 95   Temp 98.2 F (36.8 C) (Oral)   Resp 20   Ht '5\' 2"'$  (1.575 m)   Wt 101.4 kg (223 lb 8.7 oz)   SpO2 96%   BMI 40.89 kg/m  Pain Assessment: No/denies pain POSS *See Group Information*: 1-Acceptable,Awake and alert     SpO2: SpO2: 96 % O2 Device:SpO2: 96 % O2 Flow Rate: .O2 Flow Rate (L/min): 4 L/min  IO: Intake/output summary:  Intake/Output Summary (Last 24 hours) at 04/24/16 1003 Last data filed at 04/24/16 0830  Gross per 24 hour  Intake              530 ml  Output                0 ml  Net              530 ml    LBM: Last BM Date: 04/23/16 Baseline Weight: Weight:  99.8 kg (220 lb) Most recent weight: Weight: 101.4 kg (223 lb 8.7 oz)     Palliative Assessment/Data:   Flowsheet Rows   Flowsheet Row Most Recent Value  Intake Tab  Referral Department  Critical care  Unit at Time of Referral  ICU  Palliative Care Primary Diagnosis  Pulmonary  Date Notified  04/23/16  Palliative Care Type  Return patient Palliative Care  Reason for referral  Clarify Goals of Care  Date of Admission  04/19/16  Date first seen by Palliative Care  04/23/16  # of days Palliative referral response time  0 Day(s)  # of days IP prior to Palliative referral  4  Clinical Assessment  Psychosocial & Spiritual Assessment  Palliative Care Outcomes      Time In: 0915 Time Out: 1030 Time Total: 58mn Greater than 50%  of this time was spent counseling and coordinating care related to the above assessment and plan.  Signed by: PPershing Proud NP   Please contact Palliative Medicine Team phone at [phone] questions and concerns.  For individual provider: See AShea Evans

## 2016-04-24 NOTE — Telephone Encounter (Signed)
Study done on 04/22/16. Fleeger, Salome Spotted, CMA

## 2016-04-24 NOTE — Progress Notes (Signed)
Physical Therapy Treatment Patient Details Name: Anita [name] MRN: [id number] DOB: [birth year] Today's Date: 04/24/2016    History of Present Illness 70 y/o female with metastatic breast cancer, severe COPD and systolic heart failure admitted on 8/24 with HCAP requriing intubation.     PT Comments    Pt OOB in recliner on 4 lts nasal.  Pt stated she has been on oxygen for years due to COPD.  Assisted pt out of recliner to amb in hallway.  Pt moves too fast causing destats, increased RR and increased HR.  Instructed to "slow down" and instructed on purse lip breathing.    Follow Up Recommendations  SNF (pt declines SNF "I had a bad experience before")     Equipment Recommendations  None recommended by PT    Recommendations for Other Services       Precautions / Restrictions Precautions Precautions: Fall Precaution Comments: oxygen, monitor VS,  home 02  Restrictions Weight Bearing Restrictions: No    Mobility  Bed Mobility               General bed mobility comments: pt OOB in recliner  Transfers Overall transfer level: Needs assistance Equipment used: Rolling walker (2 wheeled) Transfers: Sit to/from Stand Sit to Stand: Min guard;Supervision         General transfer comment: 25% VC's for safety.  Pt impulsive.  VC's for pt to "slow down".  Ambulation/Gait Ambulation/Gait assistance: Min guard;Min assist Ambulation Distance (Feet): 45 Feet Assistive device: Rolling walker (2 wheeled) Gait Pattern/deviations: Step-through pattern Gait velocity: too quick   General Gait Details: pt tends to move too quickly causing increased RR and dyspnea.  Pt on 4 lts nasal with sats decrease to low 80's with activity and HR increased to 125.  Pt instructed to "slow down" and let her "lungs catch up to her legs".  Pt stated she "moves fast".  Instructed on purse lip breathing and to slow down her RR.  Instructed on energy conservation tech.  for most part, pt non compliant.      Stairs            Wheelchair Mobility    Modified Rankin (Stroke Patients Only)       Balance                                    Cognition Arousal/Alertness: Awake/alert Behavior During Therapy: Impulsive Overall Cognitive Status: No family/caregiver present to determine baseline cognitive functioning                      Exercises      General Comments        Pertinent Vitals/Pain Pain Assessment: No/denies pain    Home Living                      Prior Function            PT Goals (current goals can now be found in the care plan section) Progress towards PT goals: Progressing toward goals    Frequency  Min 3X/week    PT Plan Current plan remains appropriate    Co-evaluation             End of Session Equipment Utilized During Treatment: Gait belt;Oxygen Activity Tolerance: Treatment limited secondary to medical complications (Comment) (COPD) Patient left: in chair;with call bell/phone within reach  Time: [id number] PT Time Calculation (min) (ACUTE ONLY): 24 min  Charges:  $Gait Training: 8-22 mins $Therapeutic Activity: 8-22 mins                    G Codes:      Rica Koyanagi  PTA [facility]  Acute  Rehab Pager      [phone]

## 2016-04-24 NOTE — Progress Notes (Addendum)
PROGRESS NOTE                                                                                                                                                                                                             Patient Demographics:    Anita [name], is a 70 y.o. female, DOB - 02/07/1946, RDE:[id number]  Admit date - 04/19/2016   Admitting Physician Rush Landmark, MD  Outpatient Primary MD for the patient is [name], DO  LOS - 5  Outpatient Specialists: Dr Burr Medico  Chief Complaint  Patient presents with  . Weakness  . Shortness of Breath       Brief Narrative   70 year old female with metastatic breast cancer stage IV, severe COPD with chronic hypoxemic respiratory failure (on 4 L/min O2 at home), systolic CHF with EF of 25 and 30%, hypertension presented with progressive shortness of breath, cough and subjective fever. Patient refused home inhalers without much relief. While she was at the [employer] she became increasingly short of breath and was found to have O2 sat of 81% on oxygen. She is brought to the ED where ABG showed pH of 7.29, PCO2 63, PO2 59 on 5 L nasal cannula. She was placed on BiPAP but went into respiratory distress and was intubated. Symptoms were likely triggered by healthcare associated pneumonia/recurrent aspiration. Patient transferred to hospitalist service on 8/29. Palliative care consulted for goals of care discussion.       Subjective:    Patient informs her breathing to be better this morning and near her baseline..   Assessment  & Plan :    Active Problems:   Acute on chronic respiratory failure with Hypoxia and dyspnea and hypercapnia (HCC) Secondary to healthcare associated pneumonia/aspiration pneumonia and acute exacerbation of COPD. Also underlying metastatic breast cancer to the lung contributing further to her worsening respiratory function. Continue  Rocephin (day 5/7) cultures negative. Supportive care with Tylenol and antitussives. Continue O2 ( 4L/ min ) and nebs. Continue nebs, tapering steroid. Bedtime CPAP. Seen by swallowing eval and recommend dysphagia level III diet.  Chronic systolic CHF Euvolemic. EF of 25 and 30%. Continue Demadex and Lipitor.  Acute on chronic kidney disease stage III Likely secondary to hypovolemia. Improved. Continue home dose Demadex.  Hypernatremia Possibly due to hypovolemia. Monitor for now.  Hypokalemia Replenished  Dysphagia Height is for aspiration. On dysphagia level III diet with honey thick liquid after SLP evaluation. 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 Seen by Dr. Mosetta Putt. Recommends poor candidate for further chemotherapy. Holding chemotherapy for now.   Type 2 diabetes mellitus Stable on sliding scale coverage.  Anemia of chronic kidney disease  Goals of care Oral prognosis is guarded. Palliative care spoke with patient and her daughter today and will be meeting with her daughter this afternoon.     Code Status : Full code  Family Communication  : None at bedside  Disposition Plan  : PT recommend SNF.  Barriers For Discharge : Improving symptoms, palliative care goals of care discussed  Consults  :   PC CM Dr Mosetta Putt Palliative care  Procedures  :  intubation  DVT Prophylaxis  :  Lovenox -   Lab Results  Component Value Date   PLT 231 04/23/2016    Antibiotics  :   Anti-infectives    Start     Dose/Rate Route Frequency Ordered Stop   04/21/16 0900  cefTRIAXone (ROCEPHIN) 2 g in dextrose 5 % 50 mL IVPB     2 g 100 mL/hr over 30 Minutes Intravenous Every 24 hours 04/21/16 0818 04/28/16 0859   04/20/16 1600  vancomycin (VANCOCIN) 1,250 mg in sodium chloride 0.9 % 250 mL IVPB  Status:  Discontinued     1,250 mg 166.7 mL/hr over 90 Minutes Intravenous Every 24 hours 04/19/16 1738 04/21/16 0818    04/20/16 0200  piperacillin-tazobactam (ZOSYN) IVPB 3.375 g  Status:  Discontinued     3.375 g 12.5 mL/hr over 240 Minutes Intravenous Every 8 hours 04/19/16 1739 04/21/16 0818   04/19/16 1645  piperacillin-tazobactam (ZOSYN) IVPB 3.375 g     3.375 g 100 mL/hr over 30 Minutes Intravenous  Once 04/19/16 1634 04/19/16 1835   04/19/16 1645  vancomycin (VANCOCIN) IVPB 1000 mg/200 mL premix     1,000 mg 200 mL/hr over 60 Minutes Intravenous  Once 04/19/16 1634 04/19/16 1912        Objective:   Vitals:   04/23/16 2313 04/24/16 0400 04/24/16 0520 04/24/16 0857  BP: 127/87 (!) 125/59    Pulse: (!) 106 95    Resp: 18 20    Temp: 97.8 F (36.6 C) 98.2 F (36.8 C)    TempSrc: Oral Oral    SpO2: 95% 96%  96%  Weight:   101.4 kg (223 lb 8.7 oz)   Height:        Wt Readings from Last 3 Encounters:  04/24/16 101.4 kg (223 lb 8.7 oz)  04/05/16 99.8 kg (220 lb)  03/22/16 95.3 kg (210 lb)     Intake/Output Summary (Last 24 hours) at 04/24/16 1303 Last data filed at 04/24/16 1027  Gross per 24 hour  Intake              770 ml  Output                0 ml  Net              770 ml     Physical Exam  Gen: Elderly obese female not in distress HEENT: no pallor, moist mucosa, supple neck Chest: Fine basilar crackles CVS: N S1&S2, no murmurs, rubs or gallop GI: soft, NT, ND, BS+ Musculoskeletal: warm, no edema CNS: Alert and oriented    Data Review:  CBC  Recent Labs Lab 04/19/16 1617 04/19/16 2252 04/20/16 0340 04/23/16 0320  WBC 9.0 10.1 9.2 11.7*  HGB 7.8* 7.9* 7.4* 8.1*  HCT 25.7* 25.8* 23.6* 27.4*  PLT 213 198 177 231  MCV 90.5 90.8 88.4 92.9  MCH 27.5 27.8 27.7 27.5  MCHC 30.4 30.6 31.4 29.6*  RDW 20.1* 19.9* 19.6* 20.8*  LYMPHSABS 0.5*  --   --   --   MONOABS 0.7  --   --   --   EOSABS 0.5  --   --   --   BASOSABS 0.0  --   --   --     Chemistries   Recent Labs Lab 04/19/16 1617 04/19/16 2252 04/20/16 0340 04/21/16 0500 04/22/16 0329  04/23/16 0320 04/24/16 0540  NA 141  --  140  --  145 148* 149*  K 3.8  --  4.0  --  4.2 4.1 3.2*  CL 104  --  107  --  113* 113* 110  CO2 29  --  25  --  '24 28 29  '$ GLUCOSE 176*  --  295*  --  164* 177* 134*  BUN 40*  --  36*  --  46* 45* 41*  CREATININE 1.65* 1.65* 1.46* 1.61* 1.50* 1.57* 1.35*  CALCIUM 9.2  --  8.3*  --  7.8* 8.1* 8.4*  MG  --  2.5* 2.5*  --  2.9* 2.8*  --   AST 42*  --   --   --   --   --   --   ALT 32  --   --   --   --   --   --   ALKPHOS 133*  --   --   --   --   --   --   BILITOT 0.7  --   --   --   --   --   --    ------------------------------------------------------------------------------------------------------------------ No results for input(s): CHOL, HDL, LDLCALC, TRIG, CHOLHDL, LDLDIRECT in the last 72 hours.  Lab Results  Component Value Date   HGBA1C 6.7 12/01/2015   ------------------------------------------------------------------------------------------------------------------ No results for input(s): TSH, T4TOTAL, T3FREE, THYROIDAB in the last 72 hours.  Invalid input(s): FREET3 ------------------------------------------------------------------------------------------------------------------ No results for input(s): VITAMINB12, FOLATE, FERRITIN, TIBC, IRON, RETICCTPCT in the last 72 hours.  Coagulation profile  Recent Labs Lab 04/19/16 2252  INR 1.15    No results for input(s): DDIMER in the last 72 hours.  Cardiac Enzymes  Recent Labs Lab 04/19/16 2252 04/20/16 0340 04/20/16 0920  TROPONINI <0.03 <0.03 <0.03   ------------------------------------------------------------------------------------------------------------------    Component Value Date/Time   BNP 78.3 04/19/2016 1617   BNP 157.4 (H) 02/09/2016 1516    Inpatient Medications  Scheduled Meds: . acetaminophen  1,000 mg Oral Once  . budesonide (PULMICORT) nebulizer solution  0.5 mg Nebulization BID  . cefTRIAXone (ROCEPHIN)  IV  2 g Intravenous Q24H  .  chlorhexidine  15 mL Mouth Rinse BID  . Chlorhexidine Gluconate Cloth  6 each Topical Q0600  . clopidogrel  75 mg Oral Daily  . guaiFENesin  5 mL Oral QID  . heparin  5,000 Units Subcutaneous Q8H  . insulin aspart  0-9 Units Subcutaneous TID AC & HS  . ipratropium-albuterol  3 mL Nebulization TID  . mouth rinse  15 mL Mouth Rinse q12n4p  . mirtazapine  15 mg Oral QHS  . mupirocin ointment  1 application Nasal BID  . pantoprazole  40 mg Oral BID  .  predniSONE  40 mg Oral Q breakfast  . torsemide  40 mg Oral BID   Continuous Infusions:  PRN Meds:.albuterol, HYDROcodone-acetaminophen, RESOURCE THICKENUP CLEAR  Micro Results Recent Results (from the past 240 hour(s))  Blood Culture (routine x 2)     Status: None   Collection Time: 04/19/16  4:18 PM  Result Value Ref Range Status   Specimen Description BLOOD RIGHT ARM  Final   Special Requests BOTTLES DRAWN AEROBIC ONLY 5CC  Final   Culture   Final    NO GROWTH 5 DAYS Performed at [facility]    Report Status 04/24/2016 FINAL  Final  Blood Culture (routine x 2)     Status: None   Collection Time: 04/19/16  4:50 PM  Result Value Ref Range Status   Specimen Description BLOOD RIGHT ARM  Final   Special Requests IN PEDIATRIC BOTTLE [place]  Final   Culture   Final    NO GROWTH 5 DAYS Performed at [facility]    Report Status 04/24/2016 FINAL  Final  MRSA PCR Screening     Status: Abnormal   Collection Time: 04/19/16 10:41 PM  Result Value Ref Range Status   MRSA by PCR POSITIVE (A) NEGATIVE Final    Comment:        The GeneXpert MRSA Assay (FDA approved for NASAL specimens only), is one component of a comprehensive MRSA colonization surveillance program. It is not intended to diagnose MRSA infection nor to guide or monitor treatment for MRSA infections. RESULT CALLED TO, READ BACK BY AND VERIFIED WITH: C.DENNY,RN [id number] 04/20/16 [facility]   Urine culture     Status: None   Collection Time: 04/20/16 12:32 AM   Result Value Ref Range Status   Specimen Description URINE, RANDOM  Final   Special Requests NONE  Final   Culture NO GROWTH Performed at [facility]   Final   Report Status 04/21/2016 FINAL  Final    Radiology Reports Ct Chest Wo Contrast  Result Date: 04/09/2016 CLINICAL DATA:  Right breast cancer with bone and lung metastasis. Ongoing oral chemotherapy. COPD. Shortness of breath. EXAM: CT CHEST WITHOUT CONTRAST TECHNIQUE: Multidetector CT imaging of the chest was performed following the standard protocol without IV contrast. COMPARISON:  PET of 02/27/2016 FINDINGS: Cardiovascular: Aortic and branch vessel atherosclerosis. Tortuous thoracic aorta. Moderate cardiomegaly with coronary artery atherosclerosis. Pulmonary artery enlargement, including 3.2 cm outflow tract. Mediastinum/Nodes: 8 mm subcarinal node on image 59/ series 2. This is not pathologic by size criteria, but enlarged since the prior. Hilar regions poorly evaluated secondary to lack of contrast and motion. Lungs/Pleura: No pleural fluid. Mild to moderate motion degradation throughout. Scattered sub cm pulmonary nodules are suboptimally evaluated secondary to motion, grossly similar to on the prior. Index right upper lobe pulmonary nodule measures 8 x 10 mm on image 58/series 7 versus 8 x 6 mm on the prior exam (when remeasured). Right base airspace disease is likely atelectasis. Upper Abdomen: Moderate hepatic steatosis. Probable hepatomegaly, incompletely imaged. An area of relative hyper attenuation in the lateral segment left liver lobe measures 1.5 cm on image 137/series 2. Similar to on the prior PET, and not hypermetabolic. Normal imaged portions of the spleen, stomach, pancreas, adrenal glands. Musculoskeletal: Widespread osseous metastasis. Example lesion within the left lamina of T10 measures 11 mm on image 100/series 2 and is not significantly changed. Right-sided sternal lesion measures on the order of 3.3 cm versus  similar on the prior exam (when remeasured). Pathologic  fractures involving bilateral ribs. No vertebral body height loss. IMPRESSION: 1. Moderately motion degraded exam. 2. Grossly similar large volume osseous metastasis. 3. Grossly similar bilateral pulmonary nodules since 03/25/2016. Extremely poorly evaluated. 4. New subcarinal node is not pathologic by size criteria but warrants followup attention. 5. Cardiomegaly. Coronary artery atherosclerosis. Aortic atherosclerosis. 6. Pulmonary artery enlargement suggests pulmonary arterial hypertension. 7. Hepatic steatosis. A 1.5 cm relatively hyper attenuating left liver lobe "Lesion" is similar to on the prior exam and may represent an area of fat sparing (given lack of hypermetabolism). This also warrants followup attention. Electronically Signed   By: Abigail Miyamoto M.D.   On: 04/09/2016 15:41   Dg Chest Port 1 View  Result Date: 04/23/2016 CLINICAL DATA:  Pneumonia. EXAM: PORTABLE CHEST 1 VIEW COMPARISON:  04/22/2016. FINDINGS: Cardiomegaly with progressive bilateral pulmonary infiltrates and small pleural effusions. Findings suggest congestive heart failure. Bilateral pneumonia cannot be excluded. No pneumothorax. Old left posterior seventh rib fracture. IMPRESSION: Cardiomegaly with progressive bilateral pulmonary infiltrates and small pleural effusions. Findings suggest congestive heart failure. Bilateral pneumonia cannot be excluded. Electronically Signed   By: Marcello Moores  Register   On: 04/23/2016 06:49   Dg Chest Port 1 View  Result Date: 04/22/2016 CLINICAL DATA:  Pneumonia.  History of breast cancer. EXAM: PORTABLE CHEST 1 VIEW COMPARISON:  04/20/2016 FINDINGS: Cardiomegaly with vascular congestion. Diffuse bilateral interstitial and alveolar opacities, right greater than left, not significantly changed. Suspect small effusions. Interval extubation and removal of NG tube. IMPRESSION: Diffuse bilateral interstitial and alveolar opacities, right greater  than left, not significantly changed. This could represent asymmetric edema or infection. Electronically Signed   By: Rolm Baptise M.D.   On: 04/22/2016 07:11   Dg Chest Port 1 View  Result Date: 04/20/2016 CLINICAL DATA:  Respiratory failure. EXAM: PORTABLE CHEST 1 VIEW COMPARISON:  04/19/2016.  Chest CT 04/09/2016.   PET-CT 02/27/2016. FINDINGS: Endotracheal tube and NG tube in stable position. Stable cardiomegaly. Right hilar fullness. Adenopathy cannot be excluded. Progressive right lower lobe infiltrate and/or atelectasis. Bilateral pulmonary nodules, these are best identified by prior CT. Small right pleural effusion. No pneumothorax. IMPRESSION: 1.  Endotracheal tube and NG tube in stable position. 2. Progressive right lower lobe atelectasis and or infiltrate. Small right pleural effusion. 3. Pulmonary nodules again noted. These are best identified by prior CT. Mild right hilar fullness. This may be vascular. Right hilar adenopathy cannot be excluded. 4. Stable cardiomegaly. Electronically Signed   By: Marcello Moores  Register   On: 04/20/2016 06:52   Dg Chest Portable 1 View  Result Date: 04/19/2016 CLINICAL DATA:  Status post intubation EXAM: PORTABLE CHEST 1 VIEW COMPARISON:  Chest radiograph dated 04/19/2016 at 1518 hour. CT chest dated age [age]. FINDINGS: Endotracheal tube terminates 3 cm above the carina. Increased interstitial markings, favoring mild interstitial edema. Mild patchy right lower lobe opacity, atelectasis versus pneumonia. Possible small right pleural effusion. No pneumothorax. Known pulmonary nodules/metastases are not radiographically evident. Cardiomegaly. Enteric tube courses into the stomach. IMPRESSION: Endotracheal tube terminates 3 cm above the carina. Suspected mild interstitial edema with possible small right pleural effusion. Mild patchy right lower lobe opacity, atelectasis versus pneumonia. Electronically Signed   By: Julian Hy M.D.   On: 04/19/2016 19:46   Dg  Chest Port 1 View  Result Date: 04/19/2016 CLINICAL DATA:  Short of breath. History of metastatic breast cancer and COPD EXAM: PORTABLE CHEST 1 VIEW COMPARISON:  02/01/2016.  Chest CT 04/09/2016 FINDINGS: Cardiac enlargement. Interval development of diffuse bilateral airspace  disease since the prior chest x-ray. Lungs were also clear on the recent chest CT. Findings suggest congestive heart failure with mild edema. Right lower lobe airspace disease likely represents atelectasis but could represent pneumonia. No pleural effusion. No known metastatic disease to bone. IMPRESSION: Interval development of bilateral airspace disease, most likely congestive heart failure with edema Right lower lobe atelectasis/ pneumonia Electronically Signed   By: Franchot Gallo M.D.   On: 04/19/2016 16:55   Dg Swallowing Func-speech Pathology  Result Date: 04/22/2016 Objective Swallowing Evaluation: Type of Study: MBS-Modified Barium Swallow Study Patient Details Name: CLEMMA JOHNSEN MRN: [id number] Date of Birth: Sep 10, 1945 Today's Date: 04/22/2016 Time: SLP Start Time (ACUTE ONLY): 1340-SLP Stop Time (ACUTE ONLY): 1415 SLP Time Calculation (min) (ACUTE ONLY): 35 min Past Medical History: Past Medical History: Diagnosis Date . Anemia   a. Adm 09/2012 with melena, Hgb 5.8 -> transfused. EGD/colonoscopy unrevealing. . Anemia of chronic disease  . Breast cancer ([place])   a. Mets to bone. ER 100%/ PR 0%/Her2 neu negative. [name] CAD (coronary artery disease)   a. 08/2012 NSTEMI: Med Rx; b. 10/2012 MV: EF 49%, old inf infarct;  c. 02/2013 NSTEMI-> Med Rx; d. 06/2013 Cath: LM nl, LAD 40p, 80-85p/m, D1 nl, LCX 20p, OM1 nl, RCA 100d w/ L->R collats, EF 30-35%, basal to mid inf AK, ant HK. . Chronic diastolic CHF (congestive heart failure) ([place])   a. 01/2014 Echo: EF 55%, triv PR. [name] Chronic respiratory failure ([place])   a. On O2 qhs and w/ exertion. 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 CKD (chronic kidney disease),  stage IV ([place])  . COPD (chronic obstructive pulmonary disease) ([place])   a. Uses Home O2 w/ exertion. . Dementia   very mild . Depression  . Diabetes mellitus without complication ([place])  . Fibroid  . History of tobacco abuse   a. Quit 2014. [name] Hyperlipidemia 02/11/2013 . Lesion of vocal cord   a. CT 05/2013 concerning for tumor. . Morbid obesity ([place])  . Radiation 02/02/14-02/24/14  left and right femur 30 gray . Ulcers of both lower legs [facility])  Past Surgical History: Past Surgical History: Procedure Laterality Date . COLONOSCOPY, ESOPHAGOGASTRODUODENOSCOPY (EGD) AND ESOPHAGEAL DILATION N/A 10/13/2012  Procedure: colonoscopy and egd;  Surgeon: Wonda Horner, MD;  Location: [place];  Service: Endoscopy;  Laterality: N/A; . LEFT HEART CATHETERIZATION WITH CORONARY ANGIOGRAM N/A 07/07/2013  Procedure: LEFT HEART CATHETERIZATION WITH CORONARY ANGIOGRAM;  Surgeon: Peter M Martinique, MD;  Location[facility] CATH LAB;  Service: Cardiovascular;  Laterality: N/A; HPI: 70 yo female adm to [facility] with respiratory deficits - requiring intubation.  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    Pt extubated within one day of admit.     Subjective: pt awake Assessment / Plan / Recommendation CHL IP CLINICAL IMPRESSIONS 04/22/2016 Therapy Diagnosis Severe pharyngeal phase dysphagia;Moderate pharyngeal phase dysphagia Clinical Impression Pt demonstrates a moderate impairment due to delay in swallow trigger. Pt consistently aspirated thin liquids before the swallow regardless of bolus size or chin tuck.  Sensation was slightly delayed, as liquids hit the mid-upper trachea. Penetration events were not sensed. Suspect sensory impairment in the base of tongue and laryngeal vestibule and glottis impacting function, as well as compromised respiration; pt was quite fatigued after efforts to transfer to the chair during the test. Recommend dys 3 (mechanical soft)  solids with honey thick liquids, though expect pt could tolerate nectar thick liquids with small sips when respiratory function improves. Will follow for tolerance and progress.  Impact on safety and function Moderate aspiration risk   CHL IP TREATMENT RECOMMENDATION 04/22/2016 Treatment Recommendations Therapy as outlined in treatment plan below   Prognosis 04/22/2016 Prognosis for Safe Diet Advancement Good Barriers to Reach Goals -- Barriers/Prognosis Comment -- CHL IP DIET RECOMMENDATION 04/22/2016 SLP Diet Recommendations Dysphagia 3 (Mech soft) solids;Honey thick liquids Liquid Administration via Cup;Spoon Medication Administration Whole meds with puree Compensations Small sips/bites;Slow rate Postural Changes Seated upright at 90 degrees   CHL IP OTHER RECOMMENDATIONS 04/22/2016 Recommended Consults -- Oral Care Recommendations Oral care BID Other Recommendations Order thickener from pharmacy   CHL IP FOLLOW UP RECOMMENDATIONS 04/22/2016 Follow up Recommendations Skilled Nursing facility   [facility] IP FREQUENCY AND DURATION 04/22/2016 Speech Therapy Frequency (ACUTE ONLY) min 2x/week Treatment Duration 2 weeks      CHL IP ORAL PHASE 04/22/2016 Oral Phase Impaired Oral - Pudding Teaspoon -- Oral - Pudding Cup -- Oral - Honey Teaspoon -- Oral - Honey Cup -- Oral - Nectar Teaspoon -- Oral - Nectar Cup -- Oral - Nectar Straw -- Oral - Thin Teaspoon -- Oral - Thin Cup -- Oral - Thin Straw -- Oral - Puree -- Oral - Mech Soft -- Oral - Regular -- Oral - Multi-Consistency -- Oral - Pill -- Oral Phase - Comment mild impairment due to missing dentition, thrusting movement with solids  CHL IP PHARYNGEAL PHASE 04/22/2016 Pharyngeal Phase Impaired Pharyngeal- Pudding Teaspoon -- Pharyngeal -- Pharyngeal- Pudding Cup -- Pharyngeal -- Pharyngeal- Honey Teaspoon -- Pharyngeal -- Pharyngeal- Honey Cup Delayed swallow initiation-vallecula Pharyngeal -- Pharyngeal- Nectar Teaspoon -- Pharyngeal -- Pharyngeal- Nectar Cup Delayed swallow  initiation-vallecula;Delayed swallow initiation-pyriform sinuses;Penetration/Aspiration before swallow;Trace aspiration Pharyngeal Material enters airway, passes BELOW cords without attempt by patient to eject out (silent aspiration);Material does not enter airway Pharyngeal- Nectar Straw -- Pharyngeal -- Pharyngeal- Thin Teaspoon Delayed swallow initiation-pyriform sinuses;Penetration/Aspiration before swallow Pharyngeal Material enters airway, passes BELOW cords and not ejected out despite cough attempt by patient Pharyngeal- Thin Cup Delayed swallow initiation-pyriform sinuses;Penetration/Aspiration before swallow;Moderate aspiration Pharyngeal Material enters airway, passes BELOW cords and not ejected out despite cough attempt by patient Pharyngeal- Thin Straw Delayed swallow initiation-pyriform sinuses;Significant aspiration (Amount);Penetration/Aspiration before swallow;Compensatory strategies attempted (with notebox) Pharyngeal Material enters airway, passes BELOW cords and not ejected out despite cough attempt by patient Pharyngeal- Puree Delayed swallow initiation-vallecula Pharyngeal -- Pharyngeal- Mechanical Soft Delayed swallow initiation-vallecula Pharyngeal -- Pharyngeal- Regular -- Pharyngeal -- Pharyngeal- Multi-consistency -- Pharyngeal -- Pharyngeal- Pill Delayed swallow initiation-vallecula Pharyngeal -- Pharyngeal Comment --  No flowsheet data found. No flowsheet data found. Herbie [place], [place] CCC-SLP [phone] Lynann Beaver 04/22/2016, 2:37 PM               Time Spent in minutes  35   Louellen Molder M.D on 04/24/2016 at 1:03 PM  Between 7am to 7pm - Pager - [phone]  After 7pm go to [email or web address] - password [email or web address]  [facility] -  Office  [phone]

## 2016-04-24 NOTE — Care Management Important Message (Signed)
Important Message  Patient Details  Name: Anita [name] MRN: [id number] Date of Birth: Jan 08, 1946   Medicare Important Message Given:  Yes    Camillo Flaming 04/24/2016, 10:03 AMImportant Message  Patient Details  Name: Anita [name] MRN: [id number] Date of Birth: Mar 15, 1946   Medicare Important Message Given:  Yes    Camillo Flaming 04/24/2016, 9:59 AMImportant Message  Patient Details  Name: Anita [name] MRN: [id number] Date of Birth: 09-04-1945   Medicare Important Message Given:  Yes    Camillo Flaming 04/24/2016, 9:59 AM

## 2016-04-24 NOTE — Progress Notes (Signed)
Pt refused CPAP qhs.  Education provided.  RT will continue to monitor as needed. 

## 2016-04-24 NOTE — Consult Note (Signed)
   [facility] CM Inpatient Consult   04/24/2016  PETREA CHANDA 22-Feb-1946 [id number]   Patient screened for [facility] services. Chart reviewed. She was active with [facility] in the past. She was discharged due to becoming enrolled with [facility] program at home administered by [facility] agency. Spoke with inpatient RNCM to make aware that writer will contact [facility] to confirm.   Telephone call made to [facility]. Spoke with [employer]. Ms. Catanzaro has been enrolled with [facility] program since 03/20/16. States they will follow up with patient when she discharges home and that they are aware of admission. Sent notification to inpatient RNCM and made inpatient Licensed CSW aware as well. Will not engage for [facility] at this time.    Marthenia Rolling, MSN-Ed, RN,BSN [facility] Liaison [phone]

## 2016-04-25 ENCOUNTER — Telehealth: Payer: Self-pay | Admitting: Internal Medicine

## 2016-04-25 DIAGNOSIS — D638 Anemia in other chronic diseases classified elsewhere: Secondary | ICD-10-CM

## 2016-04-25 DIAGNOSIS — I509 Heart failure, unspecified: Secondary | ICD-10-CM

## 2016-04-25 DIAGNOSIS — J9621 Acute and chronic respiratory failure with hypoxia: Secondary | ICD-10-CM

## 2016-04-25 DIAGNOSIS — N183 Chronic kidney disease, stage 3 (moderate): Secondary | ICD-10-CM

## 2016-04-25 LAB — GLUCOSE, CAPILLARY
GLUCOSE-CAPILLARY: 256 mg/dL — AB (ref 65–99)
GLUCOSE-CAPILLARY: 268 mg/dL — AB (ref 65–99)
GLUCOSE-CAPILLARY: 207 mg/dL — AB (ref 65–99)
Glucose-Capillary: 152 mg/dL — ABNORMAL HIGH (ref 65–99)

## 2016-04-25 LAB — TYPE AND SCREEN
ABO/RH(D): A POS
Antibody Screen: NEGATIVE

## 2016-04-25 MED ORDER — IPRATROPIUM-ALBUTEROL 0.5-2.5 (3) MG/3ML IN SOLN
3.0000 mL | Freq: Two times a day (BID) | RESPIRATORY_TRACT | Status: DC
  Administered 2016-04-25 – 2016-04-28 (×6): 3 mL via RESPIRATORY_TRACT
  Filled 2016-04-25 (×6): qty 3

## 2016-04-25 MED ORDER — DEXTROSE 5 % IV SOLN
INTRAVENOUS | Status: AC
  Administered 2016-04-25 – 2016-04-26 (×2): via INTRAVENOUS

## 2016-04-25 MED ORDER — POTASSIUM CHLORIDE CRYS ER 10 MEQ PO TBCR
30.0000 meq | EXTENDED_RELEASE_TABLET | Freq: Four times a day (QID) | ORAL | Status: AC
Start: 2016-04-25 — End: 2016-04-25
  Administered 2016-04-25 (×2): 30 meq via ORAL
  Filled 2016-04-25 (×2): qty 1

## 2016-04-25 MED ORDER — INSULIN GLARGINE 100 UNIT/ML ~~LOC~~ SOLN
5.0000 [IU] | Freq: Every day | SUBCUTANEOUS | Status: DC
  Administered 2016-04-25: 5 [IU] via SUBCUTANEOUS
  Filled 2016-04-25 (×2): qty 0.05

## 2016-04-25 NOTE — Telephone Encounter (Signed)
Left VM for patient letting her know that I am aware that she is hospitalized at [facility] and that it is unlikely that I will be able to visit in the next couple of days due to my work schedule. Encouraged her to call the clinic so that we can talk by phone. Let her know that I am following along with her hospitalization through her records and that I am thinking of her.   Phill Myron, D.O. 04/25/2016, 5:11 PM PGY-2, Interior

## 2016-04-25 NOTE — Progress Notes (Signed)
Palliative:  I came to see Anita [name] and to continue our conversation regarding [name]. She is very sleepy and arouses slightly to answer basic yes/no questions. Spoke briefly with daughter, [name], and we will try and meet to discuss [place] further as requested by Ms. Edison Pace tomorrow 8/31 during Stacie's lunch break.   No Charge  Vinie Sill, NP Palliative Medicine Team Pager # [phone] (M-F 8a-5p) Team Phone # [phone] (Nights/Weekends)

## 2016-04-25 NOTE — Telephone Encounter (Signed)
Pt is at [facility]. She was anticipating Dr Juleen China to come to see her yesterday but she didn't.  Pt would like for Dr to come to see her

## 2016-04-25 NOTE — Progress Notes (Signed)
PT Cancellation Note  Patient Details Name: Anita [name] MRN: [id number] DOB: 1945-12-23   Cancelled Treatment:     pt declined any activity, no pacific reason.  Requested I return "tomorrow afternoon".    Nathanial Rancher 04/25/2016, 3:11 PM

## 2016-04-25 NOTE — Progress Notes (Signed)
Anita [name]   DOB:[birth year]   EV#:[id number]   WEX#:[id number]  ONCOLOGY FOLLOW UP NOTE   Subjective: Pt feels better overall, on parenteral for now. Her cough and dyspnea has significantly improved, left knee pain spontaneously resolved. She has not ambulated much, but was sitting in chair when I saw her.  Objective:  Vitals:   04/25/16 1253 04/25/16 2143  BP: (!) 127/47 (!) 119/55  Pulse: 89 77  Resp: (!) 21 20  Temp: 97.9 F (36.6 C) 97.6 F (36.4 C)    Body mass index is 41.54 kg/m.  Intake/Output Summary (Last 24 hours) at 04/25/16 2218 Last data filed at 04/25/16 1500  Gross per 24 hour  Intake             1040 ml  Output                0 ml  Net             1040 ml     Sclerae unicteric  Oropharynx clear  No peripheral adenopathy  Lungs clear -- no rales or rhonchi  Heart regular rate and rhythm  Abdomen benign  MSK no focal spinal tenderness, no peripheral edema  Neuro nonfocal    CBG (last 3)   Recent Labs  04/25/16 1157 04/25/16 1635 04/25/16 2146  GLUCAP 256* 268* 207*     Labs:  Lab Results  Component Value Date   WBC 11.7 (H) 04/23/2016   HGB 8.1 (L) 04/23/2016   HCT 27.4 (L) 04/23/2016   MCV 92.9 04/23/2016   PLT 231 04/23/2016   NEUTROABS 7.3 04/19/2016    Urine Studies No results for input(s): UHGB, CRYS in the last 72 hours.  Invalid input(s): UACOL, UAPR, USPG, UPH, UTP, UGL, UKET, UBIL, UNIT, UROB, [place], UEPI, UWBC, [place], [place], [place], [place], [place]  Basic Metabolic Panel:  Recent Labs Lab 04/19/16 1617 04/19/16 2252 04/20/16 0340 04/21/16 0500 04/22/16 0329 04/23/16 0320 04/24/16 0540  NA 141  --  140  --  145 148* 149*  K 3.8  --  4.0  --  4.2 4.1 3.2*  CL 104  --  107  --  113* 113* 110  CO2 29  --  25  --  '24 28 29  '$ GLUCOSE 176*  --  295*  --  164* 177* 134*  BUN 40*  --  36*  --  46* 45* 41*  CREATININE 1.65* 1.65* 1.46* 1.61* 1.50* 1.57* 1.35*  CALCIUM 9.2  --  8.3*  --  7.8* 8.1* 8.4*  MG  --  2.5* 2.5*  --  2.9*  2.8*  --   PHOS  --  6.0* 4.7*  --  5.6* 5.5*  --    GFR Estimated Creatinine Clearance: 44.9 mL/min (by C-G formula based on SCr of 1.35 mg/dL). Liver Function Tests:  Recent Labs Lab 04/19/16 1617  AST 42*  ALT 32  ALKPHOS 133*  BILITOT 0.7  PROT 5.8*  ALBUMIN 2.8*   No results for input(s): LIPASE, AMYLASE in the last 168 hours. No results for input(s): AMMONIA in the last 168 hours. Coagulation profile  Recent Labs Lab 04/19/16 2252  INR 1.15    CBC:  Recent Labs Lab 04/19/16 1617 04/19/16 2252 04/20/16 0340 04/23/16 0320  WBC 9.0 10.1 9.2 11.7*  NEUTROABS 7.3  --   --   --   HGB 7.8* 7.9* 7.4* 8.1*  HCT 25.7* 25.8* 23.6* 27.4*  MCV 90.5 90.8 88.4 92.9  PLT  213 198 177 231   Cardiac Enzymes:  Recent Labs Lab 04/19/16 1617 04/19/16 2252 04/20/16 0340 04/20/16 0920  TROPONINI <0.03 <0.03 <0.03 <0.03   BNP: Invalid input(s): POCBNP CBG:  Recent Labs Lab 04/24/16 2209 04/25/16 0756 04/25/16 1157 04/25/16 1635 04/25/16 2146  GLUCAP 219* 152* 256* 268* 207*   D-Dimer No results for input(s): DDIMER in the last 72 hours. Hgb A1c No results for input(s): HGBA1C in the last 72 hours. Lipid Profile No results for input(s): CHOL, HDL, LDLCALC, TRIG, CHOLHDL, LDLDIRECT in the last 72 hours. Thyroid function studies No results for input(s): TSH, T4TOTAL, T3FREE, THYROIDAB in the last 72 hours.  Invalid input(s): FREET3 Anemia work up No results for input(s): VITAMINB12, FOLATE, FERRITIN, TIBC, IRON, RETICCTPCT in the last 72 hours. Microbiology Recent Results (from the past 240 hour(s))  Blood Culture (routine x 2)     Status: None   Collection Time: 04/19/16  4:18 PM  Result Value Ref Range Status   Specimen Description BLOOD RIGHT ARM  Final   Special Requests BOTTLES DRAWN AEROBIC ONLY 5CC  Final   Culture   Final    NO GROWTH 5 DAYS Performed at [facility]    Report Status 04/24/2016 FINAL  Final  Blood Culture (routine x 2)      Status: None   Collection Time: 04/19/16  4:50 PM  Result Value Ref Range Status   Specimen Description BLOOD RIGHT ARM  Final   Special Requests IN PEDIATRIC BOTTLE [place]  Final   Culture   Final    NO GROWTH 5 DAYS Performed at [facility]    Report Status 04/24/2016 FINAL  Final  MRSA PCR Screening     Status: Abnormal   Collection Time: 04/19/16 10:41 PM  Result Value Ref Range Status   MRSA by PCR POSITIVE (A) NEGATIVE Final    Comment:        The GeneXpert MRSA Assay (FDA approved for NASAL specimens only), is one component of a comprehensive MRSA colonization surveillance program. It is not intended to diagnose MRSA infection nor to guide or monitor treatment for MRSA infections. RESULT CALLED TO, READ BACK BY AND VERIFIED WITH: C.DENNY,RN 0211 04/20/16 [facility]   Urine culture     Status: None   Collection Time: 04/20/16 12:32 AM  Result Value Ref Range Status   Specimen Description URINE, RANDOM  Final   Special Requests NONE  Final   Culture NO GROWTH Performed at [facility]   Final   Report Status 04/21/2016 FINAL  Final      Studies:  No results found.  Assessment: 70 y.o. female with metastatic breast cancer, severe COPD and systolic heart failure admitted on 8/24 with HCAP requriing intubation.   1. Acute respiratory failure with hypoxemia, pneumonia and COPD exacerbation, improving  2. Metastatic breast cancer to lung and bone, ER+/PR-/HER2- 3. CHF, CAD, HTN 4. CKD  5. Anemia of chronic disease (CKD and malignancy) 6. Type 2 DM  7. Dysphagia, on dysphagia level III diet with honey thick liquid   Plan:  -Pt is clinically improving, may be discharged in a few days  -palliative on board to discuss [place], appreciate it  -she declined SNF placement, wants to go home and go back to school -please consider 2u RBC if her Hb<8. She has been requiring frequent blood transfusion lately  -I offered appointment to see her in 1-2 weeks after  hospital discharge and resume fulvestrant injection, she declined and  wants to keep her appointment with me on 9/25   Truitt Merle, MD 04/25/2016  10:18 PM

## 2016-04-25 NOTE — Progress Notes (Signed)
Speech Language Pathology Treatment: Dysphagia  Patient Details Name: Anita [name] MRN: [id number] DOB: 12/04/1945 Today's Date: 04/25/2016 Time: 1210-1230 SLP Time Calculation (min) (ACUTE ONLY): 20 min  Assessment / Plan / Recommendation Clinical Impression  Pt seen for assessment of diet tolerance. She reports DC pending Friday, and admits she will likely not thicken her liquids to honey consistency. Pt was observed during Dys 3 lunch and honey thick liquids with trial of nectar thick liquids. Improvement noted in voice quality today, and less coughing was noted as compared to yesterday. Pt is impulsive with self-feeding, and exhibits audible swallow regardless of bolus size. Pt required ongoing cues to limit bolus size and rate of solids and liquids. No change in tolerance noted with trials of nectar thick liquid. Will recommend advancing to nectar thick liquid while still in acute care for observation, asssesment of tolerance and education, given pt admission that she would likely not adhere to honey thick restriction once she is home. Pt was encouraged to take rest breaks if fatigued or if SOB increases. Safe swallow precautions posted at [facility].   HPI HPI: 70 yo female adm to [facility] with respiratory deficits - requiring intubation.  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    Pt extubated within one day of admit.    MBS completed 04/22/16 with recommendation for dys 3/Honey thick liquid.       SLP Plan  Continue with current plan of care     Recommendations  Diet recommendations: Dysphagia 3 (mechanical soft);Nectar-thick liquid Liquids provided via: Cup;No straw Medication Administration: Whole meds with puree Supervision: Patient able to self feed;Intermittent supervision to cue for compensatory strategies Compensations: Small sips/bites;Slow rate;Minimize environmental distractions (take rest  breaks if SOB increases) Postural Changes and/or Swallow Maneuvers: Seated upright 90 degrees;Out of bed for meals;Upright 30-60 min after meal   Oral Care Recommendations: Oral care QID Follow up Recommendations: Skilled Nursing facility Plan: Continue with current plan of care     Shonna Chock 04/25/2016, 12:30 PM  Gustaf Mccarter B. Quentin Ore [facility], [place] [phone]

## 2016-04-25 NOTE — Progress Notes (Signed)
Inpatient Diabetes Program Recommendations  AACE/ADA: New Consensus Statement on Inpatient Glycemic Control (2015)  Target Ranges:  Prepandial:   less than 140 mg/dL      Peak postprandial:   less than 180 mg/dL (1-2 hours)      Critically ill patients:  140 - 180 mg/dL   Results for Anita [name], Anita [name] (MRN [id number]) as of 04/25/2016 07:54  Ref. Range 04/24/2016 07:45 04/24/2016 11:49 04/24/2016 16:24 04/24/2016 22:09  Glucose-Capillary Latest Ref Range: 65 - 99 mg/dL 133 (H) 298 (H) 310 (H) 219 (H)    Admit SOB.   History: DM, Breast Cancer, COPD, CHF, CKD  Home: None- Diet Controlled  Current Orders: Novolog Sensitive Correction Scale/ SSI (0-9 units) TID AC/HS      -Note patient started on Prednisone 40 mg daily yesterday.  -Note that Lantus 5 units daily started today.  -Patient also having issues with elevated postprandial glucose levels likely due to Prednisone.     MD- Please consider starting Novolog Meal Coverage for this patient as well while she is receiving Prednisone-  Novolog 4 units tid with meals to start (hold if pt eats <50% of meal)      --Will follow patient during hospitalization--  Wyn Quaker RN, MSN, CDE Diabetes Coordinator Inpatient Glycemic Control Team Team Pager: [phone] (8a-5p)

## 2016-04-25 NOTE — Care Management Note (Signed)
Case Management Note  Patient Details  Name: AMULYA MALWITZ MRN: [id number] Date of Birth: 09-11-1945  Subjective/Objective:  Provided patient w/HHC agency list to offer choice-she used [facility] in past,wants to urse them again.recc-HHRN-wound care,HHPT,HH social worker-CAPS wait list, frozen dinners, HH ST.Has own home 02,& portable. Has own transp home.[facility] rep Cory aware of referral-await HHC orders, & [id number].                Action/Plan:d/c plan home w/HHC.   Expected Discharge Date:   (unknown)               Expected Discharge Plan:  [place]  In-House Referral:     Discharge planning Services     Post Acute Care Choice:  Durable Medical Equipment, Resumption of Svcs/PTA Provider ([employer]-home 02,has portable, CNA-3x/wk [facility].) Choice offered to:  Patient  DME Arranged:    DME Agency:     HH Arranged:    [place] Agency:     Status of Service:  In process, will continue to follow  If discussed at Long Length of Stay Meetings, dates discussed:    Additional Comments:  Dessa Phi, RN 04/25/2016, 11:23 AM

## 2016-04-25 NOTE — Progress Notes (Signed)
PROGRESS NOTE                                                                                                                                                                                                             Patient Demographics:    Anita [name], is a 70 y.o. female, DOB - Jan 03, 1946, [id number]  Admit date - 04/19/2016   Admitting Physician Rush Landmark, MD  Outpatient Primary MD for the patient is [name], DO  LOS - 6  Outpatient Specialists: Dr Burr Medico  Chief Complaint  Patient presents with  . Weakness  . Shortness of Breath       Brief Narrative   70 year old female with metastatic breast cancer stage IV, severe COPD with chronic hypoxemic respiratory failure (on 4 L/min O2 at home), systolic CHF with EF of 25 and 30%, hypertension presented with progressive shortness of breath, cough and subjective fever. Patient refused home inhalers without much relief. While she was at the [facility] she became increasingly short of breath and was found to have O2 sat of 81% on oxygen. She is brought to the ED where ABG showed pH of 7.29, PCO2 63, PO2 59 on 5 L nasal cannula. She was placed on BiPAP but went into respiratory distress and was intubated. Symptoms were likely triggered by healthcare associated pneumonia/recurrent aspiration. Patient transferred to hospitalist service on 8/29. Palliative care consulted for goals of care discussion.       Subjective:   Reports mild cough, dyspnea at baseline   Assessment  & Plan :    Active Problems:   Acute on chronic respiratory failure with Hypoxia and dyspnea and hypercapnia (HCC) Secondary to healthcare associated pneumonia/aspiration pneumonia and acute exacerbation of COPD. Also underlying metastatic breast cancer to the lung contributing further to her worsening respiratory function. Continue Rocephin (day 5/7) cultures negative.  Supportive care with Tylenol and antitussives. Continue O2 ( 4L/ min ) and nebs. Continue nebs, tapering steroid. Bedtime CPAP. Seen by swallowing eval and recommend dysphagia level III diet with nectar thick  Chronic systolic CHF Euvolemic. EF of 25 and 30%. Continue Demadex and Lipitor.  Acute on chronic kidney disease stage III Likely secondary to hypovolemia. Improved. Continue home dose Demadex.  Hypernatremia Possibly due to hypovolemia. Continue to increase, will start on D5W   Hypokalemia  Replenished  Dysphagia Height is for aspiration. On dysphagia level III diet with honey thick liquid after SLP evaluation. 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 Seen by Dr. Burr Medico. Recommends poor candidate for further chemotherapy. Holding chemotherapy for now.   Type 2 diabetes mellitus CBGs uncontrolled while on insulin sliding scale, this is most likely due to steroids, will start on low dose Lantus.  Anemia of chronic kidney disease Recommendation to transfuse if hemoglobin less than 8 as discussed with Dr. Burr Medico, recheck CBC in a.m.  Goals of care Oral prognosis is guarded. Palliative care spoke with patient and her daughter .    Code Status : Full code  Family Communication  : None at bedside  Disposition Plan  : PT recommend SNF.  Barriers For Discharge : Improving symptoms, palliative care goals of care discussed  Consults  :   PC CM Dr Burr Medico Palliative care  Procedures  :  intubation  DVT Prophylaxis  :  Lovenox -   Lab Results  Component Value Date   PLT 231 04/23/2016    Antibiotics  :   Anti-infectives    Start     Dose/Rate Route Frequency Ordered Stop   04/21/16 0900  cefTRIAXone (ROCEPHIN) 2 g in dextrose 5 % 50 mL IVPB     2 g 100 mL/hr over 30 Minutes Intravenous Every 24 hours 04/21/16 0818 04/28/16 0859   04/20/16 1600  vancomycin (VANCOCIN) 1,250 mg in sodium chloride 0.9 % 250 mL  IVPB  Status:  Discontinued     1,250 mg 166.7 mL/hr over 90 Minutes Intravenous Every 24 hours 04/19/16 1738 04/21/16 0818   04/20/16 0200  piperacillin-tazobactam (ZOSYN) IVPB 3.375 g  Status:  Discontinued     3.375 g 12.5 mL/hr over 240 Minutes Intravenous Every 8 hours 04/19/16 1739 04/21/16 0818   04/19/16 1645  piperacillin-tazobactam (ZOSYN) IVPB 3.375 g     3.375 g 100 mL/hr over 30 Minutes Intravenous  Once 04/19/16 1634 04/19/16 1835   04/19/16 1645  vancomycin (VANCOCIN) IVPB 1000 mg/200 mL premix     1,000 mg 200 mL/hr over 60 Minutes Intravenous  Once 04/19/16 1634 04/19/16 1912        Objective:   Vitals:   04/25/16 0446 04/25/16 0831 04/25/16 0835 04/25/16 1253  BP: (!) 117/54   (!) 127/47  Pulse: 93   89  Resp: 20   (!) 21  Temp: 97.9 F (36.6 C)   97.9 F (36.6 C)  TempSrc: Oral   Oral  SpO2: 99% 98% 98% 97%  Weight: 103 kg (227 lb 1.6 oz)     Height:        Wt Readings from Last 3 Encounters:  04/25/16 103 kg (227 lb 1.6 oz)  04/05/16 99.8 kg (220 lb)  03/22/16 95.3 kg (210 lb)     Intake/Output Summary (Last 24 hours) at 04/25/16 1415 Last data filed at 04/25/16 1230  Gross per 24 hour  Intake              720 ml  Output                0 ml  Net              720 ml     Physical Exam  Gen: Elderly obese female not in distress HEENT: no pallor, moist mucosa, supple neck Chest: Fine basilar crackles CVS: N S1&S2, no  murmurs, rubs or gallop GI: soft, NT, ND, BS+ Musculoskeletal: warm, no edema CNS: Alert and oriented    Data Review:    CBC  Recent Labs Lab 04/19/16 1617 04/19/16 2252 04/20/16 0340 04/23/16 0320  WBC 9.0 10.1 9.2 11.7*  HGB 7.8* 7.9* 7.4* 8.1*  HCT 25.7* 25.8* 23.6* 27.4*  PLT 213 198 177 231  MCV 90.5 90.8 88.4 92.9  MCH 27.5 27.8 27.7 27.5  MCHC 30.4 30.6 31.4 29.6*  RDW 20.1* 19.9* 19.6* 20.8*  LYMPHSABS 0.5*  --   --   --   MONOABS 0.7  --   --   --   EOSABS 0.5  --   --   --   BASOSABS 0.0  --   --    --     Chemistries   Recent Labs Lab 04/19/16 1617 04/19/16 2252 04/20/16 0340 04/21/16 0500 04/22/16 0329 04/23/16 0320 04/24/16 0540  NA 141  --  140  --  145 148* 149*  K 3.8  --  4.0  --  4.2 4.1 3.2*  CL 104  --  107  --  113* 113* 110  CO2 29  --  25  --  '24 28 29  '$ GLUCOSE 176*  --  295*  --  164* 177* 134*  BUN 40*  --  36*  --  46* 45* 41*  CREATININE 1.65* 1.65* 1.46* 1.61* 1.50* 1.57* 1.35*  CALCIUM 9.2  --  8.3*  --  7.8* 8.1* 8.4*  MG  --  2.5* 2.5*  --  2.9* 2.8*  --   AST 42*  --   --   --   --   --   --   ALT 32  --   --   --   --   --   --   ALKPHOS 133*  --   --   --   --   --   --   BILITOT 0.7  --   --   --   --   --   --    ------------------------------------------------------------------------------------------------------------------ No results for input(s): CHOL, HDL, LDLCALC, TRIG, CHOLHDL, LDLDIRECT in the last 72 hours.  Lab Results  Component Value Date   HGBA1C 6.7 12/01/2015   ------------------------------------------------------------------------------------------------------------------ No results for input(s): TSH, T4TOTAL, T3FREE, THYROIDAB in the last 72 hours.  Invalid input(s): FREET3 ------------------------------------------------------------------------------------------------------------------ No results for input(s): VITAMINB12, FOLATE, FERRITIN, TIBC, IRON, RETICCTPCT in the last 72 hours.  Coagulation profile  Recent Labs Lab 04/19/16 2252  INR 1.15    No results for input(s): DDIMER in the last 72 hours.  Cardiac Enzymes  Recent Labs Lab 04/19/16 2252 04/20/16 0340 04/20/16 0920  TROPONINI <0.03 <0.03 <0.03   ------------------------------------------------------------------------------------------------------------------    Component Value Date/Time   BNP 78.3 04/19/2016 1617   BNP 157.4 (H) 02/09/2016 1516    Inpatient Medications  Scheduled Meds: . acetaminophen  1,000 mg Oral Once  . budesonide  (PULMICORT) nebulizer solution  0.5 mg Nebulization BID  . cefTRIAXone (ROCEPHIN)  IV  2 g Intravenous Q24H  . chlorhexidine  15 mL Mouth Rinse BID  . Chlorhexidine Gluconate Cloth  6 each Topical Q0600  . clopidogrel  75 mg Oral Daily  . guaiFENesin  5 mL Oral QID  . heparin  5,000 Units Subcutaneous Q8H  . insulin aspart  0-9 Units Subcutaneous TID AC & HS  . insulin glargine  5 Units Subcutaneous Daily  . ipratropium-albuterol  3 mL Nebulization BID  .  mouth rinse  15 mL Mouth Rinse q12n4p  . mirtazapine  15 mg Oral QHS  . pantoprazole  40 mg Oral BID  . potassium chloride  30 mEq Oral Q6H  . predniSONE  40 mg Oral Q breakfast  . torsemide  40 mg Oral BID   Continuous Infusions: . dextrose 50 mL/hr at 04/25/16 1036   PRN Meds:.albuterol, HYDROcodone-acetaminophen, RESOURCE THICKENUP CLEAR  Micro Results Recent Results (from the past 240 hour(s))  Blood Culture (routine x 2)     Status: None   Collection Time: 04/19/16  4:18 PM  Result Value Ref Range Status   Specimen Description BLOOD RIGHT ARM  Final   Special Requests BOTTLES DRAWN AEROBIC ONLY 5CC  Final   Culture   Final    NO GROWTH 5 DAYS Performed at [facility]    Report Status 04/24/2016 FINAL  Final  Blood Culture (routine x 2)     Status: None   Collection Time: 04/19/16  4:50 PM  Result Value Ref Range Status   Specimen Description BLOOD RIGHT ARM  Final   Special Requests IN PEDIATRIC BOTTLE [place]  Final   Culture   Final    NO GROWTH 5 DAYS Performed at [facility]    Report Status 04/24/2016 FINAL  Final  MRSA PCR Screening     Status: Abnormal   Collection Time: 04/19/16 10:41 PM  Result Value Ref Range Status   MRSA by PCR POSITIVE (A) NEGATIVE Final    Comment:        The GeneXpert MRSA Assay (FDA approved for NASAL specimens only), is one component of a comprehensive MRSA colonization surveillance program. It is not intended to diagnose MRSA infection nor to guide or monitor  treatment for MRSA infections. RESULT CALLED TO, READ BACK BY AND VERIFIED WITH: C.DENNY,RN [id number] 04/20/16 [facility]   Urine culture     Status: None   Collection Time: 04/20/16 12:32 AM  Result Value Ref Range Status   Specimen Description URINE, RANDOM  Final   Special Requests NONE  Final   Culture NO GROWTH Performed at [facility]   Final   Report Status 04/21/2016 FINAL  Final    Radiology Reports Ct Chest Wo Contrast  Result Date: 04/09/2016 CLINICAL DATA:  Right breast cancer with bone and lung metastasis. Ongoing oral chemotherapy. COPD. Shortness of breath. EXAM: CT CHEST WITHOUT CONTRAST TECHNIQUE: Multidetector CT imaging of the chest was performed following the standard protocol without IV contrast. COMPARISON:  PET of 02/27/2016 FINDINGS: Cardiovascular: Aortic and branch vessel atherosclerosis. Tortuous thoracic aorta. Moderate cardiomegaly with coronary artery atherosclerosis. Pulmonary artery enlargement, including 3.2 cm outflow tract. Mediastinum/Nodes: 8 mm subcarinal node on image 59/ series 2. This is not pathologic by size criteria, but enlarged since the prior. Hilar regions poorly evaluated secondary to lack of contrast and motion. Lungs/Pleura: No pleural fluid. Mild to moderate motion degradation throughout. Scattered sub cm pulmonary nodules are suboptimally evaluated secondary to motion, grossly similar to on the prior. Index right upper lobe pulmonary nodule measures 8 x 10 mm on image 58/series 7 versus 8 x 6 mm on the prior exam (when remeasured). Right base airspace disease is likely atelectasis. Upper Abdomen: Moderate hepatic steatosis. Probable hepatomegaly, incompletely imaged. An area of relative hyper attenuation in the lateral segment left liver lobe measures 1.5 cm on image 137/series 2. Similar to on the prior PET, and not hypermetabolic. Normal imaged portions of the spleen, stomach, pancreas, adrenal glands. Musculoskeletal:  Widespread osseous  metastasis. Example lesion within the left lamina of T10 measures 11 mm on image 100/series 2 and is not significantly changed. Right-sided sternal lesion measures on the order of 3.3 cm versus similar on the prior exam (when remeasured). Pathologic fractures involving bilateral ribs. No vertebral body height loss. IMPRESSION: 1. Moderately motion degraded exam. 2. Grossly similar large volume osseous metastasis. 3. Grossly similar bilateral pulmonary nodules since 03/25/2016. Extremely poorly evaluated. 4. New subcarinal node is not pathologic by size criteria but warrants followup attention. 5. Cardiomegaly. Coronary artery atherosclerosis. Aortic atherosclerosis. 6. Pulmonary artery enlargement suggests pulmonary arterial hypertension. 7. Hepatic steatosis. A 1.5 cm relatively hyper attenuating left liver lobe "Lesion" is similar to on the prior exam and may represent an area of fat sparing (given lack of hypermetabolism). This also warrants followup attention. Electronically Signed   By: Abigail Miyamoto M.D.   On: 04/09/2016 15:41   Dg Chest Port 1 View  Result Date: 04/23/2016 CLINICAL DATA:  Pneumonia. EXAM: PORTABLE CHEST 1 VIEW COMPARISON:  04/22/2016. FINDINGS: Cardiomegaly with progressive bilateral pulmonary infiltrates and small pleural effusions. Findings suggest congestive heart failure. Bilateral pneumonia cannot be excluded. No pneumothorax. Old left posterior seventh rib fracture. IMPRESSION: Cardiomegaly with progressive bilateral pulmonary infiltrates and small pleural effusions. Findings suggest congestive heart failure. Bilateral pneumonia cannot be excluded. Electronically Signed   By: Marcello Moores  Register   On: 04/23/2016 06:49   Dg Chest Port 1 View  Result Date: 04/22/2016 CLINICAL DATA:  Pneumonia.  History of breast cancer. EXAM: PORTABLE CHEST 1 VIEW COMPARISON:  04/20/2016 FINDINGS: Cardiomegaly with vascular congestion. Diffuse bilateral interstitial and alveolar opacities, right  greater than left, not significantly changed. Suspect small effusions. Interval extubation and removal of NG tube. IMPRESSION: Diffuse bilateral interstitial and alveolar opacities, right greater than left, not significantly changed. This could represent asymmetric edema or infection. Electronically Signed   By: Rolm Baptise M.D.   On: 04/22/2016 07:11   Dg Chest Port 1 View  Result Date: 04/20/2016 CLINICAL DATA:  Respiratory failure. EXAM: PORTABLE CHEST 1 VIEW COMPARISON:  04/19/2016.  Chest CT 04/09/2016.   PET-CT 02/27/2016. FINDINGS: Endotracheal tube and NG tube in stable position. Stable cardiomegaly. Right hilar fullness. Adenopathy cannot be excluded. Progressive right lower lobe infiltrate and/or atelectasis. Bilateral pulmonary nodules, these are best identified by prior CT. Small right pleural effusion. No pneumothorax. IMPRESSION: 1.  Endotracheal tube and NG tube in stable position. 2. Progressive right lower lobe atelectasis and or infiltrate. Small right pleural effusion. 3. Pulmonary nodules again noted. These are best identified by prior CT. Mild right hilar fullness. This may be vascular. Right hilar adenopathy cannot be excluded. 4. Stable cardiomegaly. Electronically Signed   By: Marcello Moores  Register   On: 04/20/2016 06:52   Dg Chest Portable 1 View  Result Date: 04/19/2016 CLINICAL DATA:  Status post intubation EXAM: PORTABLE CHEST 1 VIEW COMPARISON:  Chest radiograph dated 04/19/2016 at 1518 hour. CT chest dated age [age]. FINDINGS: Endotracheal tube terminates 3 cm above the carina. Increased interstitial markings, favoring mild interstitial edema. Mild patchy right lower lobe opacity, atelectasis versus pneumonia. Possible small right pleural effusion. No pneumothorax. Known pulmonary nodules/metastases are not radiographically evident. Cardiomegaly. Enteric tube courses into the stomach. IMPRESSION: Endotracheal tube terminates 3 cm above the carina. Suspected mild interstitial edema  with possible small right pleural effusion. Mild patchy right lower lobe opacity, atelectasis versus pneumonia. Electronically Signed   By: Julian Hy M.D.   On: 04/19/2016 19:46  Dg Chest Port 1 View  Result Date: 04/19/2016 CLINICAL DATA:  Short of breath. History of metastatic breast cancer and COPD EXAM: PORTABLE CHEST 1 VIEW COMPARISON:  02/01/2016.  Chest CT 04/09/2016 FINDINGS: Cardiac enlargement. Interval development of diffuse bilateral airspace disease since the prior chest x-ray. Lungs were also clear on the recent chest CT. Findings suggest congestive heart failure with mild edema. Right lower lobe airspace disease likely represents atelectasis but could represent pneumonia. No pleural effusion. No known metastatic disease to bone. IMPRESSION: Interval development of bilateral airspace disease, most likely congestive heart failure with edema Right lower lobe atelectasis/ pneumonia Electronically Signed   By: Franchot Gallo M.D.   On: 04/19/2016 16:55   Dg Swallowing Func-speech Pathology  Result Date: 04/22/2016 Objective Swallowing Evaluation: Type of Study: MBS-Modified Barium Swallow Study Patient Details Name: VALA RAFFO MRN: [id number] Date of Birth: [birth year] Today's Date: 04/22/2016 Time: SLP Start Time (ACUTE ONLY): 1340-SLP Stop Time (ACUTE ONLY): 1415 SLP Time Calculation (min) (ACUTE ONLY): 35 min Past Medical History: Past Medical History: Diagnosis Date . Anemia   a. Adm 09/2012 with melena, Hgb 5.8 -> transfused. EGD/colonoscopy unrevealing. . Anemia of chronic disease  . Breast cancer ([place])   a. Mets to bone. ER 100%/ PR 0%/Her2 neu negative. [name] CAD (coronary artery disease)   a. 08/2012 NSTEMI: Med Rx; b. 10/2012 MV: EF 49%, old inf infarct;  c. 02/2013 NSTEMI-> Med Rx; d. 06/2013 Cath: LM nl, LAD 40p, 80-85p/m, D1 nl, LCX 20p, OM1 nl, RCA 100d w/ L->R collats, EF 30-35%, basal to mid inf AK, ant HK. . Chronic diastolic CHF (congestive heart failure) ([place])   a. 01/2014 Echo:  EF 55%, triv PR. [name] Chronic respiratory failure ([place])   a. On O2 qhs and w/ exertion. 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 CKD (chronic kidney disease), stage IV ([place])  . COPD (chronic obstructive pulmonary disease) ([place])   a. Uses Home O2 w/ exertion. . Dementia   very mild . Depression  . Diabetes mellitus without complication ([place])  . Fibroid  . History of tobacco abuse   a. Quit 2014. [name] Hyperlipidemia 02/11/2013 . Lesion of vocal cord   a. CT 05/2013 concerning for tumor. . Morbid obesity ([place])  . Radiation 02/02/14-02/24/14  left and right femur 30 gray . Ulcers of both lower legs [facility])  Past Surgical History: Past Surgical History: Procedure Laterality Date . COLONOSCOPY, ESOPHAGOGASTRODUODENOSCOPY (EGD) AND ESOPHAGEAL DILATION N/A 10/13/2012  Procedure: colonoscopy and egd;  Surgeon: Wonda Horner, MD;  Location: [place];  Service: Endoscopy;  Laterality: N/A; . LEFT HEART CATHETERIZATION WITH CORONARY ANGIOGRAM N/A 07/07/2013  Procedure: LEFT HEART CATHETERIZATION WITH CORONARY ANGIOGRAM;  Surgeon: Peter M Martinique, MD;  Location: [facility] CATH LAB;  Service: Cardiovascular;  Laterality: N/A; HPI: 70 yo female adm to [facility] with respiratory deficits - requiring intubation.  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    Pt extubated within one day of admit.     Subjective: pt awake Assessment / Plan / Recommendation CHL IP CLINICAL IMPRESSIONS 04/22/2016 Therapy Diagnosis Severe pharyngeal phase dysphagia;Moderate pharyngeal phase dysphagia Clinical Impression Pt demonstrates a moderate impairment due to delay in swallow trigger. Pt consistently aspirated thin liquids before the swallow regardless of bolus size or chin tuck.  Sensation was slightly delayed, as liquids hit the mid-upper trachea. Penetration events  were not sensed. Suspect sensory impairment in the base of tongue and laryngeal  vestibule and glottis impacting function, as well as compromised respiration; pt was quite fatigued after efforts to transfer to the chair during the test. Recommend dys 3 (mechanical soft) solids with honey thick liquids, though expect pt could tolerate nectar thick liquids with small sips when respiratory function improves. Will follow for tolerance and progress.  Impact on safety and function Moderate aspiration risk   CHL IP TREATMENT RECOMMENDATION 04/22/2016 Treatment Recommendations Therapy as outlined in treatment plan below   Prognosis 04/22/2016 Prognosis for Safe Diet Advancement Good Barriers to Reach Goals -- Barriers/Prognosis Comment -- CHL IP DIET RECOMMENDATION 04/22/2016 SLP Diet Recommendations Dysphagia 3 (Mech soft) solids;Honey thick liquids Liquid Administration via Cup;Spoon Medication Administration Whole meds with puree Compensations Small sips/bites;Slow rate Postural Changes Seated upright at 90 degrees   CHL IP OTHER RECOMMENDATIONS 04/22/2016 Recommended Consults -- Oral Care Recommendations Oral care BID Other Recommendations Order thickener from pharmacy   CHL IP FOLLOW UP RECOMMENDATIONS 04/22/2016 Follow up Recommendations Skilled Nursing facility   [facility] IP FREQUENCY AND DURATION 04/22/2016 Speech Therapy Frequency (ACUTE ONLY) min 2x/week Treatment Duration 2 weeks      CHL IP ORAL PHASE 04/22/2016 Oral Phase Impaired Oral - Pudding Teaspoon -- Oral - Pudding Cup -- Oral - Honey Teaspoon -- Oral - Honey Cup -- Oral - Nectar Teaspoon -- Oral - Nectar Cup -- Oral - Nectar Straw -- Oral - Thin Teaspoon -- Oral - Thin Cup -- Oral - Thin Straw -- Oral - Puree -- Oral - Mech Soft -- Oral - Regular -- Oral - Multi-Consistency -- Oral - Pill -- Oral Phase - Comment mild impairment due to missing dentition, thrusting movement with solids  CHL IP PHARYNGEAL PHASE 04/22/2016 Pharyngeal Phase Impaired Pharyngeal- Pudding Teaspoon -- Pharyngeal -- Pharyngeal- Pudding Cup -- Pharyngeal -- Pharyngeal-  Honey Teaspoon -- Pharyngeal -- Pharyngeal- Honey Cup Delayed swallow initiation-vallecula Pharyngeal -- Pharyngeal- Nectar Teaspoon -- Pharyngeal -- Pharyngeal- Nectar Cup Delayed swallow initiation-vallecula;Delayed swallow initiation-pyriform sinuses;Penetration/Aspiration before swallow;Trace aspiration Pharyngeal Material enters airway, passes BELOW cords without attempt by patient to eject out (silent aspiration);Material does not enter airway Pharyngeal- Nectar Straw -- Pharyngeal -- Pharyngeal- Thin Teaspoon Delayed swallow initiation-pyriform sinuses;Penetration/Aspiration before swallow Pharyngeal Material enters airway, passes BELOW cords and not ejected out despite cough attempt by patient Pharyngeal- Thin Cup Delayed swallow initiation-pyriform sinuses;Penetration/Aspiration before swallow;Moderate aspiration Pharyngeal Material enters airway, passes BELOW cords and not ejected out despite cough attempt by patient Pharyngeal- Thin Straw Delayed swallow initiation-pyriform sinuses;Significant aspiration (Amount);Penetration/Aspiration before swallow;Compensatory strategies attempted (with notebox) Pharyngeal Material enters airway, passes BELOW cords and not ejected out despite cough attempt by patient Pharyngeal- Puree Delayed swallow initiation-vallecula Pharyngeal -- Pharyngeal- Mechanical Soft Delayed swallow initiation-vallecula Pharyngeal -- Pharyngeal- Regular -- Pharyngeal -- Pharyngeal- Multi-consistency -- Pharyngeal -- Pharyngeal- Pill Delayed swallow initiation-vallecula Pharyngeal -- Pharyngeal Comment --  No flowsheet data found. No flowsheet data found. Herbie [place], [place] [phone] Lynann Beaver 04/22/2016, 2:37 PM                [facility], Mario Voong M.D on 04/25/2016 at 2:15 PM  Between 7am to 7pm - Pager [phone]  After 7pm go to [email or web address] - password [facility]  [facility] -  Office  [phone]

## 2016-04-26 ENCOUNTER — Telehealth: Payer: Self-pay | Admitting: Oncology

## 2016-04-26 ENCOUNTER — Inpatient Hospital Stay (HOSPITAL_COMMUNITY): Payer: Commercial Managed Care - HMO

## 2016-04-26 ENCOUNTER — Ambulatory Visit
Admit: 2016-04-26 | Discharge: 2016-04-26 | Disposition: A | Discharge status: Home / Self Care | Payer: Commercial Managed Care - HMO | Attending: Radiation Oncology | Admitting: Radiation Oncology

## 2016-04-26 ENCOUNTER — Telehealth: Payer: Self-pay | Admitting: *Deleted

## 2016-04-26 ENCOUNTER — Telehealth: Payer: Self-pay | Admitting: Cardiology

## 2016-04-26 DIAGNOSIS — R1310 Dysphagia, unspecified: Secondary | ICD-10-CM

## 2016-04-26 DIAGNOSIS — E876 Hypokalemia: Secondary | ICD-10-CM

## 2016-04-26 DIAGNOSIS — C7951 Secondary malignant neoplasm of bone: Secondary | ICD-10-CM | POA: Insufficient documentation

## 2016-04-26 DIAGNOSIS — E870 Hyperosmolality and hypernatremia: Secondary | ICD-10-CM

## 2016-04-26 DIAGNOSIS — C50919 Malignant neoplasm of unspecified site of unspecified female breast: Secondary | ICD-10-CM

## 2016-04-26 LAB — GLUCOSE, CAPILLARY
GLUCOSE-CAPILLARY: 163 mg/dL — AB (ref 65–99)
GLUCOSE-CAPILLARY: 285 mg/dL — AB (ref 65–99)
Glucose-Capillary: 303 mg/dL — ABNORMAL HIGH (ref 65–99)
Glucose-Capillary: 238 mg/dL — ABNORMAL HIGH (ref 65–99)

## 2016-04-26 LAB — CBC
HCT: 26.1 % — ABNORMAL LOW (ref 36.0–46.0)
HEMOGLOBIN: 8.1 g/dL — AB (ref 12.0–15.0)
MCH: 28 pg (ref 26.0–34.0)
MCHC: 31 g/dL (ref 30.0–36.0)
MCV: 90.3 fL (ref 78.0–100.0)
PLATELETS: 178 10*3/uL (ref 150–400)
RBC: 2.89 MIL/uL — AB (ref 3.87–5.11)
RDW: 19.6 % — ABNORMAL HIGH (ref 11.5–15.5)
WBC: 9.7 10*3/uL (ref 4.0–10.5)

## 2016-04-26 LAB — BASIC METABOLIC PANEL
Anion gap: 8 (ref 5–15)
BUN: 33 mg/dL — ABNORMAL HIGH (ref 6–20)
CHLORIDE: 104 mmol/L (ref 101–111)
CO2: 31 mmol/L (ref 22–32)
CREATININE: 1.3 mg/dL — AB (ref 0.44–1.00)
Calcium: 8.4 mg/dL — ABNORMAL LOW (ref 8.9–10.3)
GFR, EST AFRICAN AMERICAN: 48 mL/min — AB (ref >60)
GFR, EST NON AFRICAN AMERICAN: 41 mL/min — AB (ref >60)
Glucose, Bld: 192 mg/dL — ABNORMAL HIGH (ref 65–99)
POTASSIUM: 2.9 mmol/L — AB (ref 3.5–5.1)
SODIUM: 143 mmol/L (ref 135–145)

## 2016-04-26 LAB — HEMOGLOBIN A1C
HEMOGLOBIN A1C: 6.6 % — AB (ref 4.8–5.6)
MEAN PLASMA GLUCOSE: 143 mg/dL

## 2016-04-26 MED ORDER — INSULIN ASPART 100 UNIT/ML ~~LOC~~ SOLN
4.0000 [IU] | Freq: Three times a day (TID) | SUBCUTANEOUS | Status: DC
  Administered 2016-04-26 – 2016-04-28 (×4): 4 [IU] via SUBCUTANEOUS

## 2016-04-26 MED ORDER — PREDNISONE 5 MG PO TABS
30.0000 mg | ORAL_TABLET | Freq: Every day | ORAL | Status: DC
  Administered 2016-04-27 – 2016-04-28 (×2): 30 mg via ORAL
  Filled 2016-04-26 (×2): qty 1

## 2016-04-26 MED ORDER — POTASSIUM CHLORIDE CRYS ER 20 MEQ PO TBCR
40.0000 meq | EXTENDED_RELEASE_TABLET | ORAL | Status: AC
  Administered 2016-04-26 (×3): 40 meq via ORAL
  Filled 2016-04-26 (×3): qty 2

## 2016-04-26 MED ORDER — INSULIN GLARGINE 100 UNIT/ML ~~LOC~~ SOLN
8.0000 [IU] | Freq: Every day | SUBCUTANEOUS | Status: DC
  Administered 2016-04-26 – 2016-04-28 (×3): 8 [IU] via SUBCUTANEOUS
  Filled 2016-04-26 (×3): qty 0.08

## 2016-04-26 MED ORDER — METOLAZONE 5 MG PO TABS
5.0000 mg | ORAL_TABLET | Freq: Once | ORAL | Status: AC
  Administered 2016-04-26: 5 mg via ORAL
  Filled 2016-04-26: qty 1

## 2016-04-26 MED ORDER — INSULIN GLARGINE 100 UNIT/ML ~~LOC~~ SOLN
10.0000 [IU] | Freq: Every day | SUBCUTANEOUS | Status: DC
  Filled 2016-04-26: qty 0.1

## 2016-04-26 NOTE — Progress Notes (Signed)
Pt refused cpap.  Pt was advised that RT is available all night and encouraged to call, should she change her mind.

## 2016-04-26 NOTE — Progress Notes (Signed)
Radiation Oncology         [phone] ________________________________  Name: Anita [name] MRN: [id number]  Date: 04/26/2016  DOB: [birth year]  Re-Evaluation Visit Note: INPATIENT 70  CC: [name], DO  Caryl Never*    ICD-9-CM ICD-10-CM   1. Breast cancer metastasized to bone, unspecified laterality (HCC) 174.9 C50.919    198.5 C79.51     Diagnosis: Metastatic breast cancer with lytic osseous metastasis involving the left proximal tibia  Interval Since Last Radiation: 2 years and 2 months  June 9 through February 24, 2014: Left and right femur 69 gray in 10 fractions  Narrative: She was admitted to the hospital for marked respiratory distress with tachypnea, hypoxia, interstitial edema, and pneumonia. She has been having a lot of pain below the left knee. Recent scan showed a large lytic area in the proximal left tibia. The patient was originally to be seen as an outpatient, but then was admitted with other medical issues.  ALLERGIES:  is allergic to omnipaque [iohexol] and benzene.  Meds: No current facility-administered medications for this encounter.    No current outpatient prescriptions on file.   Facility-Administered Medications Ordered in Other Encounters  Medication Dose Route Frequency Provider Last Rate Last Dose  . acetaminophen (TYLENOL) tablet 1,000 mg  1,000 mg Oral Once Duffy Bruce, MD      . albuterol (PROVENTIL) (2.5 MG/3ML) 0.083% nebulizer solution 2.5 mg  2.5 mg Nebulization Q4H PRN Rigoberto Noel, MD      . budesonide (PULMICORT) nebulizer solution 0.5 mg  0.5 mg Nebulization BID Jose Shirl Harris, MD   0.5 mg at 04/26/16 0851  . cefTRIAXone (ROCEPHIN) 2 g in dextrose 5 % 50 mL IVPB  2 g Intravenous Q24H Erick Colace, NP   2 g at 04/26/16 [phone]  . chlorhexidine (PERIDEX) 0.12 % solution 15 mL  15 mL Mouth Rinse BID Jose Shirl Harris, MD   15 mL at 04/26/16 1128  . clopidogrel (PLAVIX) tablet 75 mg  75 mg Oral Daily [place], MD   75 mg at 04/26/16 1093  . guaiFENesin (ROBITUSSIN) 100 MG/5ML solution 100 mg  5 mL Oral QID Rhetta Mura Schorr, NP   100 mg at 04/26/16 1424  . heparin injection 5,000 Units  5,000 Units Subcutaneous Q8H Jose Shirl Harris, MD   Stopped at 04/20/16 2200  . HYDROcodone-acetaminophen (NORCO/VICODIN) 5-325 MG per tablet 1 tablet  1 tablet Oral Q6H PRN Erick Colace, NP      . insulin aspart (novoLOG) injection 0-9 Units  0-9 Units Subcutaneous TID AC & HS Erick Colace, NP   5 Units at 04/26/16 1246  . insulin aspart (novoLOG) injection 4 Units  4 Units Subcutaneous TID WC Dawood S Elgergawy, MD      . insulin glargine (LANTUS) injection 8 Units  8 Units Subcutaneous Daily Albertine Patricia, MD   8 Units at 04/26/16 1121  . ipratropium-albuterol (DUONEB) 0.5-2.5 (3) MG/3ML nebulizer solution 3 mL  3 mL Nebulization BID Albertine Patricia, MD   3 mL at 04/26/16 0851  . MEDLINE mouth rinse  15 mL Mouth Rinse q12n4p Jose Angelo Radford Pax, MD   15 mL at 04/26/16 1311  . mirtazapine (REMERON) tablet 15 mg  15 mg Oral QHS Jose Shirl Harris, MD   15 mg at 04/25/16 2248  . pantoprazole (PROTONIX) EC tablet 40 mg  40 mg Oral  BID Erick Colace, NP   40 mg at 04/26/16 1127  . [START ON 04/27/2016] predniSONE (DELTASONE) tablet 30 mg  30 mg Oral Q breakfast Albertine Patricia, MD      . [place]   Oral PRN Rush Landmark, MD      . torsemide [facility]) tablet 40 mg  40 mg Oral BID Raylene Miyamoto, MD   40 mg at 04/26/16 7858    Physical Findings:  Vitals with BMI 04/26/2016  Height   Weight 223 lbs 2 oz  BMI   Systolic 850  Diastolic 46  Pulse 85  Respirations 18  She was sitting in her hospital chair. Nasal cannula in place with oxygen.  Lab Findings: Lab Results  Component Value Date   WBC 9.7 04/26/2016   HGB 8.1 (L) 04/26/2016   HCT 26.1 (L) 04/26/2016   MCV 90.3 04/26/2016   PLT 178 04/26/2016    Radiographic Findings: Dg Chest 2  View  Result Date: 04/26/2016 CLINICAL DATA:  Respiratory failure.  Shortness of breath . EXAM: CHEST  2 VIEW COMPARISON:  04/23/2016. FINDINGS: Cardiomegaly with pulmonary vascular prominence and bilateral interstitial prominence and bilateral pleural effusions consistent with congestive heart failure is again noted. Findings have improved from prior exam. No pneumothorax. Old left rib fractures. IMPRESSION: Congestive heart failure bilateral from interstitial edema and bilateral pleural effusions, findings have improved from prior exam . Electronically Signed   By: Marcello Moores  Register   On: 04/26/2016 09:31   Ct Chest Wo Contrast  Result Date: 04/09/2016 CLINICAL DATA:  Right breast cancer with bone and lung metastasis. Ongoing oral chemotherapy. COPD. Shortness of breath. EXAM: CT CHEST WITHOUT CONTRAST TECHNIQUE: Multidetector CT imaging of the chest was performed following the standard protocol without IV contrast. COMPARISON:  PET of 02/27/2016 FINDINGS: Cardiovascular: Aortic and branch vessel atherosclerosis. Tortuous thoracic aorta. Moderate cardiomegaly with coronary artery atherosclerosis. Pulmonary artery enlargement, including 3.2 cm outflow tract. Mediastinum/Nodes: 8 mm subcarinal node on image 59/ series 2. This is not pathologic by size criteria, but enlarged since the prior. Hilar regions poorly evaluated secondary to lack of contrast and motion. Lungs/Pleura: No pleural fluid. Mild to moderate motion degradation throughout. Scattered sub cm pulmonary nodules are suboptimally evaluated secondary to motion, grossly similar to on the prior. Index right upper lobe pulmonary nodule measures 8 x 10 mm on image 58/series 7 versus 8 x 6 mm on the prior exam (when remeasured). Right base airspace disease is likely atelectasis. Upper Abdomen: Moderate hepatic steatosis. Probable hepatomegaly, incompletely imaged. An area of relative hyper attenuation in the lateral segment left liver lobe measures 1.5 cm  on image 137/series 2. Similar to on the prior PET, and not hypermetabolic. Normal imaged portions of the spleen, stomach, pancreas, adrenal glands. Musculoskeletal: Widespread osseous metastasis. Example lesion within the left lamina of T10 measures 11 mm on image 100/series 2 and is not significantly changed. Right-sided sternal lesion measures on the order of 3.3 cm versus similar on the prior exam (when remeasured). Pathologic fractures involving bilateral ribs. No vertebral body height loss. IMPRESSION: 1. Moderately motion degraded exam. 2. Grossly similar large volume osseous metastasis. 3. Grossly similar bilateral pulmonary nodules since 03/25/2016. Extremely poorly evaluated. 4. New subcarinal node is not pathologic by size criteria but warrants followup attention. 5. Cardiomegaly. Coronary artery atherosclerosis. Aortic atherosclerosis. 6. Pulmonary artery enlargement suggests pulmonary arterial hypertension. 7. Hepatic steatosis. A 1.5 cm relatively hyper attenuating left liver lobe "Lesion" is similar  to on the prior exam and may represent an area of fat sparing (given lack of hypermetabolism). This also warrants followup attention. Electronically Signed   By: Abigail Miyamoto M.D.   On: 04/09/2016 15:41   Dg Chest Port 1 View  Result Date: 04/23/2016 CLINICAL DATA:  Pneumonia. EXAM: PORTABLE CHEST 1 VIEW COMPARISON:  04/22/2016. FINDINGS: Cardiomegaly with progressive bilateral pulmonary infiltrates and small pleural effusions. Findings suggest congestive heart failure. Bilateral pneumonia cannot be excluded. No pneumothorax. Old left posterior seventh rib fracture. IMPRESSION: Cardiomegaly with progressive bilateral pulmonary infiltrates and small pleural effusions. Findings suggest congestive heart failure. Bilateral pneumonia cannot be excluded. Electronically Signed   By: Marcello Moores  Register   On: 04/23/2016 06:49   Dg Chest Port 1 View  Result Date: 04/22/2016 CLINICAL DATA:  Pneumonia.  History  of breast cancer. EXAM: PORTABLE CHEST 1 VIEW COMPARISON:  04/20/2016 FINDINGS: Cardiomegaly with vascular congestion. Diffuse bilateral interstitial and alveolar opacities, right greater than left, not significantly changed. Suspect small effusions. Interval extubation and removal of NG tube. IMPRESSION: Diffuse bilateral interstitial and alveolar opacities, right greater than left, not significantly changed. This could represent asymmetric edema or infection. Electronically Signed   By: Rolm Baptise M.D.   On: 04/22/2016 07:11   Dg Chest Port 1 View  Result Date: 04/20/2016 CLINICAL DATA:  Respiratory failure. EXAM: PORTABLE CHEST 1 VIEW COMPARISON:  04/19/2016.  Chest CT 04/09/2016.   PET-CT 02/27/2016. FINDINGS: Endotracheal tube and NG tube in stable position. Stable cardiomegaly. Right hilar fullness. Adenopathy cannot be excluded. Progressive right lower lobe infiltrate and/or atelectasis. Bilateral pulmonary nodules, these are best identified by prior CT. Small right pleural effusion. No pneumothorax. IMPRESSION: 1.  Endotracheal tube and NG tube in stable position. 2. Progressive right lower lobe atelectasis and or infiltrate. Small right pleural effusion. 3. Pulmonary nodules again noted. These are best identified by prior CT. Mild right hilar fullness. This may be vascular. Right hilar adenopathy cannot be excluded. 4. Stable cardiomegaly. Electronically Signed   By: Marcello Moores  Register   On: 04/20/2016 06:52   Dg Chest Portable 1 View  Result Date: 04/19/2016 CLINICAL DATA:  Status post intubation EXAM: PORTABLE CHEST 1 VIEW COMPARISON:  Chest radiograph dated 04/19/2016 at 1518 hour. CT chest dated age [age]. FINDINGS: Endotracheal tube terminates 3 cm above the carina. Increased interstitial markings, favoring mild interstitial edema. Mild patchy right lower lobe opacity, atelectasis versus pneumonia. Possible small right pleural effusion. No pneumothorax. Known pulmonary nodules/metastases are  not radiographically evident. Cardiomegaly. Enteric tube courses into the stomach. IMPRESSION: Endotracheal tube terminates 3 cm above the carina. Suspected mild interstitial edema with possible small right pleural effusion. Mild patchy right lower lobe opacity, atelectasis versus pneumonia. Electronically Signed   By: Julian Hy M.D.   On: 04/19/2016 19:46   Dg Chest Port 1 View  Result Date: 04/19/2016 CLINICAL DATA:  Short of breath. History of metastatic breast cancer and COPD EXAM: PORTABLE CHEST 1 VIEW COMPARISON:  02/01/2016.  Chest CT 04/09/2016 FINDINGS: Cardiac enlargement. Interval development of diffuse bilateral airspace disease since the prior chest x-ray. Lungs were also clear on the recent chest CT. Findings suggest congestive heart failure with mild edema. Right lower lobe airspace disease likely represents atelectasis but could represent pneumonia. No pleural effusion. No known metastatic disease to bone. IMPRESSION: Interval development of bilateral airspace disease, most likely congestive heart failure with edema Right lower lobe atelectasis/ pneumonia Electronically Signed   By: Franchot Gallo M.D.   On: 04/19/2016 16:55  Dg Swallowing Func-speech Pathology  Result Date: 04/22/2016 Objective Swallowing Evaluation: Type of Study: MBS-Modified Barium Swallow Study Patient Details Name: SHYLIN KEIZER MRN: [id number] Date of Birth: [birth year] Today's Date: 04/22/2016 Time: SLP Start Time (ACUTE ONLY): 1340-SLP Stop Time (ACUTE ONLY): 1415 SLP Time Calculation (min) (ACUTE ONLY): 35 min Past Medical History: Past Medical History: Diagnosis Date . Anemia   a. Adm 09/2012 with melena, Hgb 5.8 -> transfused. EGD/colonoscopy unrevealing. . Anemia of chronic disease  . Breast cancer ([place])   a. Mets to bone. ER 100%/ PR 0%/Her2 neu negative. [name] CAD (coronary artery disease)   a. 08/2012 NSTEMI: Med Rx; b. 10/2012 MV: EF 49%, old inf infarct;  c. 02/2013 NSTEMI-> Med Rx; d. 06/2013 Cath: LM nl, LAD  40p, 80-85p/m, D1 nl, LCX 20p, OM1 nl, RCA 100d w/ L->R collats, EF 30-35%, basal to mid inf AK, ant HK. . Chronic diastolic CHF (congestive heart failure) ([place])   a. 01/2014 Echo: EF 55%, triv PR. [name] Chronic respiratory failure ([place])   a. On O2 qhs and w/ exertion. 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 CKD (chronic kidney disease), stage IV ([place])  . COPD (chronic obstructive pulmonary disease) ([place])   a. Uses Home O2 w/ exertion. . Dementia   very mild . Depression  . Diabetes mellitus without complication ([place])  . Fibroid  . History of tobacco abuse   a. Quit 2014. [name] Hyperlipidemia 02/11/2013 . Lesion of vocal cord   a. CT 05/2013 concerning for tumor. . Morbid obesity ([place])  . Radiation 02/02/14-02/24/14  left and right femur 30 gray . Ulcers of both lower legs [facility])  Past Surgical History: Past Surgical History: Procedure Laterality Date . COLONOSCOPY, ESOPHAGOGASTRODUODENOSCOPY (EGD) AND ESOPHAGEAL DILATION N/A 10/13/2012  Procedure: colonoscopy and egd;  Surgeon: Wonda Horner, MD;  Location: [place];  Service: Endoscopy;  Laterality: N/A; . LEFT HEART CATHETERIZATION WITH CORONARY ANGIOGRAM N/A 07/07/2013  Procedure: LEFT HEART CATHETERIZATION WITH CORONARY ANGIOGRAM;  Surgeon: Peter M Martinique, MD;  Location: [facility] CATH LAB;  Service: Cardiovascular;  Laterality: N/A; HPI: 70 yo female adm [facility] with respiratory deficits - requiring intubation.  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    Pt extubated within one day of admit.     Subjective: pt awake Assessment / Plan / Recommendation CHL IP CLINICAL IMPRESSIONS 04/22/2016 Therapy Diagnosis Severe pharyngeal phase dysphagia;Moderate pharyngeal phase dysphagia Clinical Impression Pt demonstrates a moderate impairment due to delay in swallow trigger. Pt consistently aspirated thin liquids before the swallow regardless of bolus size or  chin tuck.  Sensation was slightly delayed, as liquids hit the mid-upper trachea. Penetration events were not sensed. Suspect sensory impairment in the base of tongue and laryngeal vestibule and glottis impacting function, as well as compromised respiration; pt was quite fatigued after efforts to transfer to the chair during the test. Recommend dys 3 (mechanical soft) solids with honey thick liquids, though expect pt could tolerate nectar thick liquids with small sips when respiratory function improves. Will follow for tolerance and progress.  Impact on safety and function Moderate aspiration risk   CHL IP TREATMENT RECOMMENDATION 04/22/2016 Treatment Recommendations Therapy as outlined in treatment plan below   Prognosis 04/22/2016 Prognosis for Safe Diet Advancement Good Barriers to Reach Goals -- Barriers/Prognosis Comment -- CHL IP DIET RECOMMENDATION 04/22/2016 SLP Diet Recommendations Dysphagia 3 (  Mech soft) solids;Honey thick liquids Liquid Administration via Cup;Spoon Medication Administration Whole meds with puree Compensations Small sips/bites;Slow rate Postural Changes Seated upright at 90 degrees   CHL IP OTHER RECOMMENDATIONS 04/22/2016 Recommended Consults -- Oral Care Recommendations Oral care BID Other Recommendations Order thickener from pharmacy   CHL IP FOLLOW UP RECOMMENDATIONS 04/22/2016 Follow up Recommendations Skilled Nursing facility   [facility] IP FREQUENCY AND DURATION 04/22/2016 Speech Therapy Frequency (ACUTE ONLY) min 2x/week Treatment Duration 2 weeks      CHL IP ORAL PHASE 04/22/2016 Oral Phase Impaired Oral - Pudding Teaspoon -- Oral - Pudding Cup -- Oral - Honey Teaspoon -- Oral - Honey Cup -- Oral - Nectar Teaspoon -- Oral - Nectar Cup -- Oral - Nectar Straw -- Oral - Thin Teaspoon -- Oral - Thin Cup -- Oral - Thin Straw -- Oral - Puree -- Oral - Mech Soft -- Oral - Regular -- Oral - Multi-Consistency -- Oral - Pill -- Oral Phase - Comment mild impairment due to missing dentition, thrusting  movement with solids  CHL IP PHARYNGEAL PHASE 04/22/2016 Pharyngeal Phase Impaired Pharyngeal- Pudding Teaspoon -- Pharyngeal -- Pharyngeal- Pudding Cup -- Pharyngeal -- Pharyngeal- Honey Teaspoon -- Pharyngeal -- Pharyngeal- Honey Cup Delayed swallow initiation-vallecula Pharyngeal -- Pharyngeal- Nectar Teaspoon -- Pharyngeal -- Pharyngeal- Nectar Cup Delayed swallow initiation-vallecula;Delayed swallow initiation-pyriform sinuses;Penetration/Aspiration before swallow;Trace aspiration Pharyngeal Material enters airway, passes BELOW cords without attempt by patient to eject out (silent aspiration);Material does not enter airway Pharyngeal- Nectar Straw -- Pharyngeal -- Pharyngeal- Thin Teaspoon Delayed swallow initiation-pyriform sinuses;Penetration/Aspiration before swallow Pharyngeal Material enters airway, passes BELOW cords and not ejected out despite cough attempt by patient Pharyngeal- Thin Cup Delayed swallow initiation-pyriform sinuses;Penetration/Aspiration before swallow;Moderate aspiration Pharyngeal Material enters airway, passes BELOW cords and not ejected out despite cough attempt by patient Pharyngeal- Thin Straw Delayed swallow initiation-pyriform sinuses;Significant aspiration (Amount);Penetration/Aspiration before swallow;Compensatory strategies attempted (with notebox) Pharyngeal Material enters airway, passes BELOW cords and not ejected out despite cough attempt by patient Pharyngeal- Puree Delayed swallow initiation-vallecula Pharyngeal -- Pharyngeal- Mechanical Soft Delayed swallow initiation-vallecula Pharyngeal -- Pharyngeal- Regular -- Pharyngeal -- Pharyngeal- Multi-consistency -- Pharyngeal -- Pharyngeal- Pill Delayed swallow initiation-vallecula Pharyngeal -- Pharyngeal Comment --  No flowsheet data found. No flowsheet data found. Herbie [place], [place] CCC-SLP [phone] Othelia Pulling Katherene Ponto 04/22/2016, 2:37 PM               Impression: Metastatic breast cancer with lytic osseous  metastasis involving the left proximal tibia  The patient is symptomatic from her lytic metastasis involving the left proximal tibia. I would recommend a short course of palliative radiotherapy.  Plan: The patient will undergo CT simulation today and begin treatment September 1. I anticipate 10 treatments.  ____________________________________ -----------------------------------  Blair Promise, PhD, MD  This document serves as a record of services personally performed by Gery Pray, MD. It was created on his behalf by Darcus Austin, a trained medical scribe. The creation of this record is based on the scribe's personal observations and the provider's statements to them. This document has been checked and approved by the attending provider.

## 2016-04-26 NOTE — Progress Notes (Signed)
PROGRESS NOTE                                                                                                                                                                                                             Patient Demographics:    Anita [name], is a 69 y.o. female, DOB - 1946-05-24, WIO:[id number]  Admit date - 04/19/2016   Admitting Physician Rush Landmark, MD  Outpatient Primary MD for the patient is [name], DO  LOS - 7  Outpatient Specialists: Dr Burr Medico  Chief Complaint  Patient presents with  . Weakness  . Shortness of Breath       Brief Narrative   70 year old female with metastatic breast cancer stage IV, severe COPD with chronic hypoxemic respiratory failure (on 4 L/min O2 at home), systolic CHF with EF of 25 and 30%, hypertension presented with progressive shortness of breath, cough and subjective fever. Patient refused home inhalers without much relief. While she was at the [facility] she became increasingly short of breath and was found to have O2 sat of 81% on oxygen. She is brought to the ED where ABG showed pH of 7.29, PCO2 63, PO2 59 on 5 L nasal cannula. She was placed on BiPAP but went into respiratory distress and was intubated. Symptoms were likely triggered by healthcare associated pneumonia/recurrent aspiration. Patient transferred to hospitalist service on 8/29. Palliative care consulted for goals of care discussion.    Subjective:   Reports mild cough, dyspnea at baseline, Reports she is feeling better today.   Assessment  & Plan :    Acute on chronic respiratory failure with Hypoxia and dyspnea and hypercapnia (HCC) - Secondary to healthcare associated pneumonia/aspiration pneumonia and acute exacerbation of COPD. - Also underlying metastatic breast cancer to the lung contributing further to her worsening respiratory function. - Continue Rocephin (day 7/7)  cultures negative.  - Continue O2 ( 4L/ min ) and nebs. - Continue nebs, tapering steroid. - Bedtime CPAP. - Seen by swallowing eval and recommend dysphagia level III diet with nectar thick  Chronic systolic CHF - EF of 25 and 30%. Continue Demadex and Lipitor.Still with evidence of volume overload, will give one extra dose of Zaroxolyn today to help with diuresis.  Acute on chronic kidney disease stage III - Improved. Continue monitor as on  home dose Demadex.  Hypernatremia - Possibly due to hypovolemia.We'll continue on D5W low dose as continuing with torsemide and giving Zaroxolyn today.  Hypokalemia - Repleted , recheck in a.m.   Dysphagia - High risk for aspiration  aspiration. On dysphagia level III diet with nectar  thick liquid after SLP evaluation. 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 - Seen by Dr. Burr Medico. Recommends poor candidate for further chemotherapy. Holding chemotherapy for now.   Type 2 diabetes mellitus - CBGs uncontrolled while on insulin sliding scale, this is most likely due to steroids,better controlled after starting low-dose Lantus .  Anemia of chronic kidney disease - Recommendation to transfuse if hemoglobin less than 8 as discussed with Dr. Burr Medico, recheck CBC in a.m.  Goals of care Oral prognosis is guarded. Palliative care spoke with patient and her daughter .    Code Status : Full code  Family Communication  : None at bedside  Disposition Plan  : PT recommend SNF.But patient wants to go home, spread discharge in 2-3 days after patient was appropriately diuresed  Barriers For Discharge : Improving symptoms, palliative care goals of care discussed  Consults  :   PCCM Oncology Dr Burr Medico Palliative care  Procedures  :  intubation  DVT Prophylaxis  :  Lovenox -   Lab Results  Component Value Date   PLT 178 04/26/2016    Antibiotics  :   Anti-infectives    Start      Dose/Rate Route Frequency Ordered Stop   04/21/16 0900  cefTRIAXone (ROCEPHIN) 2 g in dextrose 5 % 50 mL IVPB     2 g 100 mL/hr over 30 Minutes Intravenous Every 24 hours 04/21/16 0818 04/28/16 0859   04/20/16 1600  vancomycin (VANCOCIN) 1,250 mg in sodium chloride 0.9 % 250 mL IVPB  Status:  Discontinued     1,250 mg 166.7 mL/hr over 90 Minutes Intravenous Every 24 hours 04/19/16 1738 04/21/16 0818   04/20/16 0200  piperacillin-tazobactam (ZOSYN) IVPB 3.375 g  Status:  Discontinued     3.375 g 12.5 mL/hr over 240 Minutes Intravenous Every 8 hours 04/19/16 1739 04/21/16 0818   04/19/16 1645  piperacillin-tazobactam (ZOSYN) IVPB 3.375 g     3.375 g 100 mL/hr over 30 Minutes Intravenous  Once 04/19/16 1634 04/19/16 1835   04/19/16 1645  vancomycin (VANCOCIN) IVPB 1000 mg/200 mL premix     1,000 mg 200 mL/hr over 60 Minutes Intravenous  Once 04/19/16 1634 04/19/16 1912        Objective:   Vitals:   04/25/16 1253 04/25/16 2143 04/26/16 0456 04/26/16 0851  BP: (!) 127/47 (!) 119/55 (!) 106/46   Pulse: 89 77 85 96  Resp: (!) '21 20 18 18  '$ Temp: 97.9 F (36.6 C) 97.6 F (36.4 C) 97.9 F (36.6 C)   TempSrc: Oral Axillary Oral   SpO2: 97% 100% 100% 100%  Weight:   101.2 kg (223 lb 1.7 oz)   Height:        Wt Readings from Last 3 Encounters:  04/26/16 101.2 kg (223 lb 1.7 oz)  04/05/16 99.8 kg (220 lb)  03/22/16 95.3 kg (210 lb)     Intake/Output Summary (Last 24 hours) at 04/26/16 1108 Last data filed at 04/26/16 0847  Gross per 24 hour  Intake          1574.17 ml  Output  0 ml  Net          1574.17 ml     Physical Exam  Gen: Elderly obese female not in distress HEENT: no pallor, moist mucosa, supple neck Chest: Fine basilar crackles CVS: N S1&S2, no murmurs, rubs or gallop GI: soft, NT, ND, BS+ Musculoskeletal: warm, no edema CNS: Alert and oriented    Data Review:    CBC  Recent Labs Lab 04/19/16 1617 04/19/16 2252 04/20/16 0340  04/23/16 0320 04/26/16 0534  WBC 9.0 10.1 9.2 11.7* 9.7  HGB 7.8* 7.9* 7.4* 8.1* 8.1*  HCT 25.7* 25.8* 23.6* 27.4* 26.1*  PLT 213 198 177 231 178  MCV 90.5 90.8 88.4 92.9 90.3  MCH 27.5 27.8 27.7 27.5 28.0  MCHC 30.4 30.6 31.4 29.6* 31.0  RDW 20.1* 19.9* 19.6* 20.8* 19.6*  LYMPHSABS 0.5*  --   --   --   --   MONOABS 0.7  --   --   --   --   EOSABS 0.5  --   --   --   --   BASOSABS 0.0  --   --   --   --     Chemistries   Recent Labs Lab 04/19/16 1617 04/19/16 2252 04/20/16 0340 04/21/16 0500 04/22/16 0329 04/23/16 0320 04/24/16 0540 04/26/16 0534  NA 141  --  140  --  145 148* 149* 143  K 3.8  --  4.0  --  4.2 4.1 3.2* 2.9*  CL 104  --  107  --  113* 113* 110 104  CO2 29  --  25  --  '24 28 29 31  '$ GLUCOSE 176*  --  295*  --  164* 177* 134* 192*  BUN 40*  --  36*  --  46* 45* 41* 33*  CREATININE 1.65* 1.65* 1.46* 1.61* 1.50* 1.57* 1.35* 1.30*  CALCIUM 9.2  --  8.3*  --  7.8* 8.1* 8.4* 8.4*  MG  --  2.5* 2.5*  --  2.9* 2.8*  --   --   AST 42*  --   --   --   --   --   --   --   ALT 32  --   --   --   --   --   --   --   ALKPHOS 133*  --   --   --   --   --   --   --   BILITOT 0.7  --   --   --   --   --   --   --    ------------------------------------------------------------------------------------------------------------------ No results for input(s): CHOL, HDL, LDLCALC, TRIG, CHOLHDL, LDLDIRECT in the last 72 hours.  Lab Results  Component Value Date   HGBA1C 6.6 (H) 04/24/2016   ------------------------------------------------------------------------------------------------------------------ No results for input(s): TSH, T4TOTAL, T3FREE, THYROIDAB in the last 72 hours.  Invalid input(s): FREET3 ------------------------------------------------------------------------------------------------------------------ No results for input(s): VITAMINB12, FOLATE, FERRITIN, TIBC, IRON, RETICCTPCT in the last 72 hours.  Coagulation profile  Recent Labs Lab  04/19/16 2252  INR 1.15    No results for input(s): DDIMER in the last 72 hours.  Cardiac Enzymes  Recent Labs Lab 04/19/16 2252 04/20/16 0340 04/20/16 0920  TROPONINI <0.03 <0.03 <0.03   ------------------------------------------------------------------------------------------------------------------    Component Value Date/Time   BNP 78.3 04/19/2016 1617   BNP 157.4 (H) 02/09/2016 1516    Inpatient Medications  Scheduled Meds: . acetaminophen  1,000 mg Oral Once  . budesonide (PULMICORT)  nebulizer solution  0.5 mg Nebulization BID  . cefTRIAXone (ROCEPHIN)  IV  2 g Intravenous Q24H  . chlorhexidine  15 mL Mouth Rinse BID  . clopidogrel  75 mg Oral Daily  . guaiFENesin  5 mL Oral QID  . heparin  5,000 Units Subcutaneous Q8H  . insulin aspart  0-9 Units Subcutaneous TID AC & HS  . insulin glargine  8 Units Subcutaneous Daily  . ipratropium-albuterol  3 mL Nebulization BID  . mouth rinse  15 mL Mouth Rinse q12n4p  . mirtazapine  15 mg Oral QHS  . pantoprazole  40 mg Oral BID  . potassium chloride  40 mEq Oral Q4H  . [START ON 04/27/2016] predniSONE  30 mg Oral Q breakfast  . torsemide  40 mg Oral BID   Continuous Infusions: . dextrose 50 mL/hr at 04/26/16 0622   PRN Meds:.albuterol, HYDROcodone-acetaminophen, RESOURCE THICKENUP CLEAR  Micro Results Recent Results (from the past 240 hour(s))  Blood Culture (routine x 2)     Status: None   Collection Time: 04/19/16  4:18 PM  Result Value Ref Range Status   Specimen Description BLOOD RIGHT ARM  Final   Special Requests BOTTLES DRAWN AEROBIC ONLY 5CC  Final   Culture   Final    NO GROWTH 5 DAYS Performed at [facility]    Report Status 04/24/2016 FINAL  Final  Blood Culture (routine x 2)     Status: None   Collection Time: 04/19/16  4:50 PM  Result Value Ref Range Status   Specimen Description BLOOD RIGHT ARM  Final   Special Requests IN PEDIATRIC BOTTLE [place]  Final   Culture   Final    NO GROWTH 5  DAYS Performed at [facility]    Report Status 04/24/2016 FINAL  Final  MRSA PCR Screening     Status: Abnormal   Collection Time: 04/19/16 10:41 PM  Result Value Ref Range Status   MRSA by PCR POSITIVE (A) NEGATIVE Final    Comment:        The GeneXpert MRSA Assay (FDA approved for NASAL specimens only), is one component of a comprehensive MRSA colonization surveillance program. It is not intended to diagnose MRSA infection nor to guide or monitor treatment for MRSA infections. RESULT CALLED TO, READ BACK BY AND VERIFIED WITH: C.DENNY,RN [id number] 04/20/16 [facility]   Urine culture     Status: None   Collection Time: 04/20/16 12:32 AM  Result Value Ref Range Status   Specimen Description URINE, RANDOM  Final   Special Requests NONE  Final   Culture NO GROWTH Performed at [facility]   Final   Report Status 04/21/2016 FINAL  Final    Radiology Reports Dg Chest 2 View  Result Date: 04/26/2016 CLINICAL DATA:  Respiratory failure.  Shortness of breath . EXAM: CHEST  2 VIEW COMPARISON:  04/23/2016. FINDINGS: Cardiomegaly with pulmonary vascular prominence and bilateral interstitial prominence and bilateral pleural effusions consistent with congestive heart failure is again noted. Findings have improved from prior exam. No pneumothorax. Old left rib fractures. IMPRESSION: Congestive heart failure bilateral from interstitial edema and bilateral pleural effusions, findings have improved from prior exam . Electronically Signed   By: Marcello Moores  Register   On: 04/26/2016 09:31   Ct Chest Wo Contrast  Result Date: 04/09/2016 CLINICAL DATA:  Right breast cancer with bone and lung metastasis. Ongoing oral chemotherapy. COPD. Shortness of breath. EXAM: CT CHEST WITHOUT CONTRAST TECHNIQUE: Multidetector CT imaging of the chest was  performed following the standard protocol without IV contrast. COMPARISON:  PET of 02/27/2016 FINDINGS: Cardiovascular: Aortic and branch vessel atherosclerosis.  Tortuous thoracic aorta. Moderate cardiomegaly with coronary artery atherosclerosis. Pulmonary artery enlargement, including 3.2 cm outflow tract. Mediastinum/Nodes: 8 mm subcarinal node on image 59/ series 2. This is not pathologic by size criteria, but enlarged since the prior. Hilar regions poorly evaluated secondary to lack of contrast and motion. Lungs/Pleura: No pleural fluid. Mild to moderate motion degradation throughout. Scattered sub cm pulmonary nodules are suboptimally evaluated secondary to motion, grossly similar to on the prior. Index right upper lobe pulmonary nodule measures 8 x 10 mm on image 58/series 7 versus 8 x 6 mm on the prior exam (when remeasured). Right base airspace disease is likely atelectasis. Upper Abdomen: Moderate hepatic steatosis. Probable hepatomegaly, incompletely imaged. An area of relative hyper attenuation in the lateral segment left liver lobe measures 1.5 cm on image 137/series 2. Similar to on the prior PET, and not hypermetabolic. Normal imaged portions of the spleen, stomach, pancreas, adrenal glands. Musculoskeletal: Widespread osseous metastasis. Example lesion within the left lamina of T10 measures 11 mm on image 100/series 2 and is not significantly changed. Right-sided sternal lesion measures on the order of 3.3 cm versus similar on the prior exam (when remeasured). Pathologic fractures involving bilateral ribs. No vertebral body height loss. IMPRESSION: 1. Moderately motion degraded exam. 2. Grossly similar large volume osseous metastasis. 3. Grossly similar bilateral pulmonary nodules since 03/25/2016. Extremely poorly evaluated. 4. New subcarinal node is not pathologic by size criteria but warrants followup attention. 5. Cardiomegaly. Coronary artery atherosclerosis. Aortic atherosclerosis. 6. Pulmonary artery enlargement suggests pulmonary arterial hypertension. 7. Hepatic steatosis. A 1.5 cm relatively hyper attenuating left liver lobe "Lesion" is similar to on  the prior exam and may represent an area of fat sparing (given lack of hypermetabolism). This also warrants followup attention. Electronically Signed   By: Abigail Miyamoto M.D.   On: 04/09/2016 15:41   Dg Chest Port 1 View  Result Date: 04/23/2016 CLINICAL DATA:  Pneumonia. EXAM: PORTABLE CHEST 1 VIEW COMPARISON:  04/22/2016. FINDINGS: Cardiomegaly with progressive bilateral pulmonary infiltrates and small pleural effusions. Findings suggest congestive heart failure. Bilateral pneumonia cannot be excluded. No pneumothorax. Old left posterior seventh rib fracture. IMPRESSION: Cardiomegaly with progressive bilateral pulmonary infiltrates and small pleural effusions. Findings suggest congestive heart failure. Bilateral pneumonia cannot be excluded. Electronically Signed   By: Marcello Moores  Register   On: 04/23/2016 06:49   Dg Chest Port 1 View  Result Date: 04/22/2016 CLINICAL DATA:  Pneumonia.  History of breast cancer. EXAM: PORTABLE CHEST 1 VIEW COMPARISON:  04/20/2016 FINDINGS: Cardiomegaly with vascular congestion. Diffuse bilateral interstitial and alveolar opacities, right greater than left, not significantly changed. Suspect small effusions. Interval extubation and removal of NG tube. IMPRESSION: Diffuse bilateral interstitial and alveolar opacities, right greater than left, not significantly changed. This could represent asymmetric edema or infection. Electronically Signed   By: Rolm Baptise M.D.   On: 04/22/2016 07:11   Dg Chest Port 1 View  Result Date: 04/20/2016 CLINICAL DATA:  Respiratory failure. EXAM: PORTABLE CHEST 1 VIEW COMPARISON:  04/19/2016.  Chest CT 04/09/2016.   PET-CT 02/27/2016. FINDINGS: Endotracheal tube and NG tube in stable position. Stable cardiomegaly. Right hilar fullness. Adenopathy cannot be excluded. Progressive right lower lobe infiltrate and/or atelectasis. Bilateral pulmonary nodules, these are best identified by prior CT. Small right pleural effusion. No pneumothorax.  IMPRESSION: 1.  Endotracheal tube and NG tube in stable position. 2. Progressive  right lower lobe atelectasis and or infiltrate. Small right pleural effusion. 3. Pulmonary nodules again noted. These are best identified by prior CT. Mild right hilar fullness. This may be vascular. Right hilar adenopathy cannot be excluded. 4. Stable cardiomegaly. Electronically Signed   By: Marcello Moores  Register   On: 04/20/2016 06:52   Dg Chest Portable 1 View  Result Date: 04/19/2016 CLINICAL DATA:  Status post intubation EXAM: PORTABLE CHEST 1 VIEW COMPARISON:  Chest radiograph dated 04/19/2016 at 1518 hour. CT chest dated age [age]. FINDINGS: Endotracheal tube terminates 3 cm above the carina. Increased interstitial markings, favoring mild interstitial edema. Mild patchy right lower lobe opacity, atelectasis versus pneumonia. Possible small right pleural effusion. No pneumothorax. Known pulmonary nodules/metastases are not radiographically evident. Cardiomegaly. Enteric tube courses into the stomach. IMPRESSION: Endotracheal tube terminates 3 cm above the carina. Suspected mild interstitial edema with possible small right pleural effusion. Mild patchy right lower lobe opacity, atelectasis versus pneumonia. Electronically Signed   By: Julian Hy M.D.   On: 04/19/2016 19:46   Dg Chest Port 1 View  Result Date: 04/19/2016 CLINICAL DATA:  Short of breath. History of metastatic breast cancer and COPD EXAM: PORTABLE CHEST 1 VIEW COMPARISON:  02/01/2016.  Chest CT 04/09/2016 FINDINGS: Cardiac enlargement. Interval development of diffuse bilateral airspace disease since the prior chest x-ray. Lungs were also clear on the recent chest CT. Findings suggest congestive heart failure with mild edema. Right lower lobe airspace disease likely represents atelectasis but could represent pneumonia. No pleural effusion. No known metastatic disease to bone. IMPRESSION: Interval development of bilateral airspace disease, most likely  congestive heart failure with edema Right lower lobe atelectasis/ pneumonia Electronically Signed   By: Franchot Gallo M.D.   On: 04/19/2016 16:55   Dg Swallowing Func-speech Pathology  Result Date: 04/22/2016 Objective Swallowing Evaluation: Type of Study: MBS-Modified Barium Swallow Study Patient Details Name: TRINETTA ALEMU MRN: [id number] Date of Birth: 1946-04-03 Today's Date: 04/22/2016 Time: SLP Start Time (ACUTE ONLY): 1340-SLP Stop Time (ACUTE ONLY): 1415 SLP Time Calculation (min) (ACUTE ONLY): 35 min Past Medical History: Past Medical History: Diagnosis Date . Anemia   a. Adm 09/2012 with melena, Hgb 5.8 -> transfused. EGD/colonoscopy unrevealing. . Anemia of chronic disease  . Breast cancer ([place])   a. Mets to bone. ER 100%/ PR 0%/Her2 neu negative. [name] CAD (coronary artery disease)   a. 08/2012 NSTEMI: Med Rx; b. 10/2012 MV: EF 49%, old inf infarct;  c. 02/2013 NSTEMI-> Med Rx; d. 06/2013 Cath: LM nl, LAD 40p, 80-85p/m, D1 nl, LCX 20p, OM1 nl, RCA 100d w/ L->R collats, EF 30-35%, basal to mid inf AK, ant HK. . Chronic diastolic CHF (congestive heart failure) ([place])   a. 01/2014 Echo: EF 55%, triv PR. [name] Chronic respiratory failure ([place])   a. On O2 qhs and w/ exertion. 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 CKD (chronic kidney disease), stage IV ([place])  . COPD (chronic obstructive pulmonary disease) ([place])   a. Uses Home O2 w/ exertion. . Dementia   very mild . Depression  . Diabetes mellitus without complication ([place])  . Fibroid  . History of tobacco abuse   a. Quit 2014. [name] Hyperlipidemia 02/11/2013 . Lesion of vocal cord   a. CT 05/2013 concerning for tumor. . Morbid obesity ([place])  . Radiation 02/02/14-02/24/14  left and right femur 30 gray . Ulcers of both lower legs [facility])  Past Surgical History: Past Surgical History: Procedure Laterality Date .  COLONOSCOPY, ESOPHAGOGASTRODUODENOSCOPY (EGD) AND ESOPHAGEAL DILATION N/A 10/13/2012  Procedure: colonoscopy and egd;  Surgeon: Wonda Horner, MD;  Location: [place];  Service: Endoscopy;  Laterality: N/A; . LEFT HEART CATHETERIZATION WITH CORONARY ANGIOGRAM N/A 07/07/2013  Procedure: LEFT HEART CATHETERIZATION WITH CORONARY ANGIOGRAM;  Surgeon: Peter M Martinique, MD;  Location: [facility] CATH LAB;  Service: Cardiovascular;  Laterality: N/A; HPI: 70 yo female adm to [facility] with respiratory deficits - requiring intubation.  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    Pt extubated within one day of admit.     Subjective: pt awake Assessment / Plan / Recommendation CHL IP CLINICAL IMPRESSIONS 04/22/2016 Therapy Diagnosis Severe pharyngeal phase dysphagia;Moderate pharyngeal phase dysphagia Clinical Impression Pt demonstrates a moderate impairment due to delay in swallow trigger. Pt consistently aspirated thin liquids before the swallow regardless of bolus size or chin tuck.  Sensation was slightly delayed, as liquids hit the mid-upper trachea. Penetration events were not sensed. Suspect sensory impairment in the base of tongue and laryngeal vestibule and glottis impacting function, as well as compromised respiration; pt was quite fatigued after efforts to transfer to the chair during the test. Recommend dys 3 (mechanical soft) solids with honey thick liquids, though expect pt could tolerate nectar thick liquids with small sips when respiratory function improves. Will follow for tolerance and progress.  Impact on safety and function Moderate aspiration risk   CHL IP TREATMENT RECOMMENDATION 04/22/2016 Treatment Recommendations Therapy as outlined in treatment plan below   Prognosis 04/22/2016 Prognosis for Safe Diet Advancement Good Barriers to Reach Goals -- Barriers/Prognosis Comment -- CHL IP DIET RECOMMENDATION 04/22/2016 SLP Diet Recommendations Dysphagia 3 (Mech soft) solids;Honey thick liquids Liquid Administration via Cup;Spoon Medication Administration Whole  meds with puree Compensations Small sips/bites;Slow rate Postural Changes Seated upright at 90 degrees   CHL IP OTHER RECOMMENDATIONS 04/22/2016 Recommended Consults -- Oral Care Recommendations Oral care BID Other Recommendations Order thickener from pharmacy   CHL IP FOLLOW UP RECOMMENDATIONS 04/22/2016 Follow up Recommendations Skilled Nursing facility   [facility] IP FREQUENCY AND DURATION 04/22/2016 Speech Therapy Frequency (ACUTE ONLY) min 2x/week Treatment Duration 2 weeks      CHL IP ORAL PHASE 04/22/2016 Oral Phase Impaired Oral - Pudding Teaspoon -- Oral - Pudding Cup -- Oral - Honey Teaspoon -- Oral - Honey Cup -- Oral - Nectar Teaspoon -- Oral - Nectar Cup -- Oral - Nectar Straw -- Oral - Thin Teaspoon -- Oral - Thin Cup -- Oral - Thin Straw -- Oral - Puree -- Oral - Mech Soft -- Oral - Regular -- Oral - Multi-Consistency -- Oral - Pill -- Oral Phase - Comment mild impairment due to missing dentition, thrusting movement with solids  CHL IP PHARYNGEAL PHASE 04/22/2016 Pharyngeal Phase Impaired Pharyngeal- Pudding Teaspoon -- Pharyngeal -- Pharyngeal- Pudding Cup -- Pharyngeal -- Pharyngeal- Honey Teaspoon -- Pharyngeal -- Pharyngeal- Honey Cup Delayed swallow initiation-vallecula Pharyngeal -- Pharyngeal- Nectar Teaspoon -- Pharyngeal -- Pharyngeal- Nectar Cup Delayed swallow initiation-vallecula;Delayed swallow initiation-pyriform sinuses;Penetration/Aspiration before swallow;Trace aspiration Pharyngeal Material enters airway, passes BELOW cords without attempt by patient to eject out (silent aspiration);Material does not enter airway Pharyngeal- Nectar Straw -- Pharyngeal -- Pharyngeal- Thin Teaspoon Delayed swallow initiation-pyriform sinuses;Penetration/Aspiration before swallow Pharyngeal Material enters airway, passes BELOW cords and not ejected out despite cough attempt by patient Pharyngeal- Thin Cup Delayed swallow initiation-pyriform sinuses;Penetration/Aspiration before swallow;Moderate aspiration  Pharyngeal Material enters airway, passes  BELOW cords and not ejected out despite cough attempt by patient Pharyngeal- Thin Straw Delayed swallow initiation-pyriform sinuses;Significant aspiration (Amount);Penetration/Aspiration before swallow;Compensatory strategies attempted (with notebox) Pharyngeal Material enters airway, passes BELOW cords and not ejected out despite cough attempt by patient Pharyngeal- Puree Delayed swallow initiation-vallecula Pharyngeal -- Pharyngeal- Mechanical Soft Delayed swallow initiation-vallecula Pharyngeal -- Pharyngeal- Regular -- Pharyngeal -- Pharyngeal- Multi-consistency -- Pharyngeal -- Pharyngeal- Pill Delayed swallow initiation-vallecula Pharyngeal -- Pharyngeal Comment --  No flowsheet data found. No flowsheet data found. Herbie Baltimore, [place] [phone] Lynann Beaver 04/22/2016, 2:37 PM                [facility], Joram Venson M.D on 04/26/2016 at 11:08 AM  Between 7am to 7pm - Pager - [phone]  After 7pm go to [email or web address] - password [facility]  [facility] -  Office  [phone]

## 2016-04-26 NOTE — Telephone Encounter (Signed)
[name], RN on [address] [place] to see if she is OK to come for CT SIM at 3 pm.  [name] said that was fine.  She travels by wheelchair and is on contact precautions for MRSA.  Notified [name] RT in CT SIM.

## 2016-04-26 NOTE — Telephone Encounter (Signed)
"  I'm in [place].  Would you tell Dr. Burr Medico my HGB = 8.1 today.  My breathing is better today than it was when admitted on Tuesday."

## 2016-04-26 NOTE — Care Management Note (Signed)
Case Management Note  Patient Details  Name: Anita [name] MRN: [id number] Date of Birth: 1946/04/29  Subjective/Objective:  HHRN/PT/social worker, ST ordered-[facility] rep [facility] aware. Home 02- [facility].Ambulance transp @ d/c.                  Action/Plan:d/c home w/HHC.   Expected Discharge Date:   (unknown)               Expected Discharge Plan:  [place]  In-House Referral:     Discharge planning Services     Post Acute Care Choice:  Durable Medical Equipment, Resumption of Svcs/PTA Provider ([facility]-home 02,has portable, CNA-3x/wk [facility].) Choice offered to:  Patient  DME Arranged:    DME Agency:     HH Arranged:  RN, PT, Speech Therapy, Social Work [place] Agency:  [facility]  Status of Service:  In process, will continue to follow  If discussed at Long Length of Stay Meetings, dates discussed:    Additional Comments:  Dessa Phi, RN 04/26/2016, 4:06 PM

## 2016-04-26 NOTE — Telephone Encounter (Signed)
New Message  Pt voiced she is in hospital and she's in room 1406 of [place].  Pt voiced no need to follow up just to let MD and nurse know.

## 2016-04-26 NOTE — Progress Notes (Signed)
Physical Therapy Treatment Patient Details Name: Anita [name] MRN: [id number] DOB: 1945/11/30 Today's Date: 04/26/2016    History of Present Illness 70 y/o female with metastatic breast cancer, severe COPD and systolic heart failure admitted on 8/24 with HCAP requriing intubation.     PT Comments    Pt eager to "get out of here".  Assisted with toileting then amb a greater distance in hallway.  Pt remained on 4 lts nasal with avg sats 95% and HR 124.    Follow Up Recommendations  SNF (pt declines SNF rec by evaluating LPT)     Equipment Recommendations  None recommended by PT    Recommendations for Other Services       Precautions / Restrictions Precautions Precautions: Fall Precaution Comments: oxygen, monitor VS,  home 02  Restrictions Weight Bearing Restrictions: No    Mobility  Bed Mobility               General bed mobility comments: pt OOB in recliner  Transfers Overall transfer level: Needs assistance Equipment used: Rolling walker (2 wheeled) Transfers: Sit to/from Stand Sit to Stand: Supervision Stand pivot transfers: Supervision       General transfer comment: 25% VC's for safety.  Pt impulsive.  VC's for pt to "slow down".  Ambulation/Gait Ambulation/Gait assistance: Min guard;Supervision Ambulation Distance (Feet): 54 Feet Assistive device: Rolling walker (2 wheeled) Gait Pattern/deviations: Step-through pattern;Decreased stride length;Shuffle     General Gait Details: tolerated increased distance with avg sats at 95% on 4 lts (much improved from last session) and avg HR 124.  25% VC's to "slow down" and perfoprm purse lip breathing.     Stairs            Wheelchair Mobility    Modified Rankin (Stroke Patients Only)       Balance                                    Cognition Arousal/Alertness: Awake/alert Behavior During Therapy: Impulsive Overall Cognitive Status: Within Functional Limits for tasks  assessed                      Exercises      General Comments        Pertinent Vitals/Pain Pain Assessment: No/denies pain    Home Living                      Prior Function            PT Goals (current goals can now be found in the care plan section) Progress towards PT goals: Progressing toward goals    Frequency  Min 3X/week    PT Plan Current plan remains appropriate    Co-evaluation             End of Session Equipment Utilized During Treatment: Gait belt;Oxygen Activity Tolerance: Treatment limited secondary to medical complications (Comment) Patient left: in chair;with call bell/phone within reach     Time: 1354-1418 PT Time Calculation (min) (ACUTE ONLY): 24 min  Charges:  $Gait Training: 8-22 mins $Therapeutic Activity: 8-22 mins                    G Codes:      Rica Koyanagi  PTA [facility]  Acute  Rehab Pager      [phone]

## 2016-04-26 NOTE — Progress Notes (Signed)
Daily Progress Note   Patient Name: Anita [name]       Date: 04/26/2016 DOB: 1945-09-26  Age: 70 y.o. MRN#: [id number] Attending Physician: Albertine Patricia, MD Primary Care Physician: [name], DO Admit Date: 04/19/2016  Reason for Consultation/Follow-up: Establishing goals of care  Subjective: I met again with Anita [name]. Unable to reach daughter, Anita [name], today. I left voicemails. Offering to meet tomorrow Friday ~1000 am to discuss options and GOC. Anita [name] has previously told me she is off work Friday so hopeful this can happen.   I did meet again with Anita [name]. We discuss most of the same with focused discussion on her disease processes and trajectories as well as her wishes for CPR/intubation given poor expected outcome. She asks many of the same questions. Unable to make decisions without daughter Anita [name]. Strongly leaning towards desire for no CPR but wavers with decision for intubation. She remains hopeful to return home and states that she doesn't expect "any of this to happen anytime soon." Emotional support provided. Rad onc planning to address radiation to left knee this admission.   Length of Stay: 7  Current Medications: Scheduled Meds:  . acetaminophen  1,000 mg Oral Once  . budesonide (PULMICORT) nebulizer solution  0.5 mg Nebulization BID  . cefTRIAXone (ROCEPHIN)  IV  2 g Intravenous Q24H  . chlorhexidine  15 mL Mouth Rinse BID  . clopidogrel  75 mg Oral Daily  . guaiFENesin  5 mL Oral QID  . heparin  5,000 Units Subcutaneous Q8H  . insulin aspart  0-9 Units Subcutaneous TID AC & HS  . insulin glargine  8 Units Subcutaneous Daily  . ipratropium-albuterol  3 mL Nebulization BID  . mouth rinse  15 mL Mouth Rinse q12n4p  . mirtazapine  15 mg Oral QHS  .  pantoprazole  40 mg Oral BID  . potassium chloride  40 mEq Oral Q4H  . [START ON 04/27/2016] predniSONE  30 mg Oral Q breakfast  . torsemide  40 mg Oral BID    Continuous Infusions: . dextrose 50 mL/hr at 04/26/16 0622    PRN Meds: albuterol, HYDROcodone-acetaminophen, RESOURCE THICKENUP CLEAR  Physical Exam  Constitutional: She is oriented to person, place, and time. She appears well-developed and well-nourished.  HENT:  Head: Normocephalic and atraumatic.  Cardiovascular: Normal rate.  Pulmonary/Chest: No accessory muscle usage. No tachypnea. No respiratory distress.  Secretions/congestion improved today.   Abdominal: Normal appearance.  Neurological: She is alert and oriented to person, place, and time.            Vital Signs: BP (!) 106/46 (BP Location: Left Arm)   Pulse 96   Temp 97.9 F (36.6 C) (Oral)   Resp 18   Ht _0  (1.575 m)   Wt 101.2 kg (223 lb 1.7 oz)   SpO2 100%   BMI 40.81 kg/m  SpO2: SpO2: 100 % O2 Device: O2 Device: Nasal Cannula O2 Flow Rate: O2 Flow Rate (L/min): 3.5 L/min  Intake/output summary:  Intake/Output Summary (Last 24 hours) at 04/26/16 1303 Last data filed at 04/26/16 1207  Gross per 24 hour  Intake          1556.17 ml  Output                0 ml  Net          1556.17 ml   LBM: Last BM Date: 04/26/16 Baseline Weight: Weight: 99.8 kg (220 lb) Most recent weight: Weight: 101.2 kg (223 lb 1.7 oz)       Palliative Assessment/Data:    Flowsheet Rows   Flowsheet Row Most Recent Value  Intake Tab  Referral Department  Critical care  Unit at Time of Referral  ICU  Palliative Care Primary Diagnosis  Pulmonary  Date Notified  04/23/16  Palliative Care Type  Return patient Palliative Care  Reason for referral  Clarify Goals of Care  Date of Admission  04/19/16  Date first seen by Palliative Care  04/23/16  # of days Palliative referral response time  0 Day(s)  # of days IP prior to Palliative referral  4  Clinical Assessment    Psychosocial & Spiritual Assessment  Palliative Care Outcomes      Patient Active Problem List   Diagnosis Date Noted  . Acute renal failure superimposed on stage 3 chronic kidney disease ([place]) 04/24/2016  . Acute on chronic respiratory failure with hypoxia and hypercapnia ([place]) 04/24/2016  . Hypernatremia 04/24/2016  . Hypokalemia 04/24/2016  . Dysphagia   . DNR (do not resuscitate) discussion   . Left shift   . Bilateral pneumonia   . Acute on chronic respiratory failure with hypercapnia ([place]) 04/19/2016  . HCAP (healthcare-associated pneumonia)   . Acute systolic congestive heart failure ([place])   . Hypotension 04/06/2016  . Cough 03/08/2016  . Cataracts, bilateral 03/08/2016  . 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 Chronic combined systolic and diastolic congestive heart failure ([place]) 02/09/2016  . Lytic lesion of bone on x-ray 02/09/2016  . SOB (shortness of breath) 02/09/2016  . NSTEMI (non-ST elevated myocardial infarction) ([place])   . Acute on chronic congestive heart failure ([place])   . COPD exacerbation ([place]) 12/01/2015  . Erythema of lower extremity 12/01/2015  . Palliative care encounter   . Severe sepsis ([place]) 05/12/2015  . AKI (acute kidney injury) ([place]) 05/12/2015  . CAD in native artery   . Venous stasis ulcer ([place])   . COLD (chronic obstructive lung disease) ([place])   . Venous stasis of lower extremity 12/05/2014  . 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 Hyperlipidemia 02/11/2013  . Breast cancer metastasized to bone ([place]) 10/14/2012  . Chronic respiratory failure ([place]) 10/06/2012  . Chronic diastolic heart failure ([place]) 10/06/2012  . CAD (coronary artery disease) 10/06/2012  . CKD (chronic kidney disease), stage IV ([place]) 10/06/2012  .  Anemia of chronic disease 10/06/2012  . Breast mass, right 10/06/2012  . History of tobacco abuse 10/06/2012  . COPD (chronic obstructive pulmonary disease) ([place]) 09/27/2012  . DM2 (diabetes mellitus, type 2) ([place]) 09/26/2012     Palliative Care Assessment & Plan   Patient Profile: 70 y.o. female  with past medical history of stage IV breast cancer (mets lung, bone, cervical nodes), chronic hypoxemic resp failure,severe COPD, systolic CHF EF 58-52%, hypertension who presents 04/19/2016 with shortness of breath. Patient states over last few weeks she's had progressively worsening shortness of breath, cough, weakness. She was at school [facility] when EMS was called d/t SOB. Failed trial of BiPAP and required intubation. Found to have HCAP likely r/t aspiration (could be causing cough) and placed on dysphagia III, honey thick diet.    Assessment: Sitting up in chair. Fluctuating periods of lethargy (did not recall me coming to see her yesterday).   Recommendations/Plan:  Mucous/congestion: Continue guaifenesin.   Goals of Care and Additional Recommendations:  Limitations on Scope of Treatment: No Tracheostomy  Code Status:    Code Status Orders        Start     Ordered   04/19/16 2118  Full code  Continuous     04/19/16 2121    Code Status History    Date Active Date Inactive Code Status Order ID Comments User Context   02/01/2016 12:47 PM 02/03/2016  7:38 PM Full Code [id number]  Olin Hauser, DO Inpatient   05/12/2015  8:28 PM 05/15/2015 10:15 PM Full Code [id number]  Sela Hua, MD Inpatient   04/04/2015  9:58 PM 04/05/2015  9:44 PM Full Code [id number]  Veatrice Bourbon, MD Inpatient   04/01/2014  9:33 PM 04/10/2014  9:52 PM Full Code [id number]  Consuelo Pandy, PA-C Inpatient   02/15/2014  5:35 PM 02/18/2014 10:54 PM Full Code [id number]  Coral Spikes, DO Inpatient   02/04/2014 12:24 AM 02/07/2014 10:06 PM Full Code [id number]  Cordelia Poche, MD Inpatient   01/10/2014  6:46 PM 01/13/2014  9:12 PM Full Code [id number]  Nolon Rod, DO Inpatient   07/06/2013  8:01 PM 07/09/2013 10:45 PM Full Code [id number]  Cordelia Poche, MD Inpatient   03/14/2013 10:09 AM 03/17/2013  7:50 PM Partial Code [id number]  Kandis Nab,  MD Inpatient   09/24/2012  7:56 PM 10/02/2012  7:48 PM Full Code [id number]  Hardie Shackleton, RN Inpatient    Advance Directive Documentation   Flowsheet Row Most Recent Value  Type of Advance Directive  Living will  Pre-existing out of facility DNR order (yellow form or pink MOST form)  No data  "MOST" Form in Place?  No data       Prognosis:  Difficult to say but multiple comorbitities and new aspiration with likely < 6 months.   Discharge Planning:  Home with Home Health   Thank you for allowing the Palliative Medicine Team to assist in the care of this patient.   Time In: 1215 Time Out: 1300 Total Time 51mn Prolonged Time Billed  no       Greater than 50%  of this time was spent counseling and coordinating care related to the above assessment and plan.  PPershing Proud NP  Please contact Palliative Medicine Team phone at [phone] questions and concerns.

## 2016-04-26 NOTE — Progress Notes (Signed)
  Radiation Oncology         [phone] ________________________________  Name: Anita [name] MRN: [id number]  Date: 04/26/2016  DOB: 12/13/1945  INPATIENT  SIMULATION AND TREATMENT PLANNING NOTE    ICD-9-CM ICD-10-CM   1. Breast cancer metastasized to bone, unspecified laterality (HCC) 174.9 C50.919    198.5 C79.51     DIAGNOSIS:  Metastatic breast cancer with lytic osseous metastasis involving the left proximal tibia  NARRATIVE:  The patient was brought to the [facility].  Identity was confirmed.  All relevant records and images related to the planned course of therapy were reviewed.  The patient freely provided informed written consent to proceed with treatment after reviewing the details related to the planned course of therapy. The consent form was witnessed and verified by the simulation staff.  Then, the patient was set-up in a stable reproducible  supine position for radiation therapy.  CT images were obtained.  Surface markings were placed.  The CT images were loaded into the planning software.  Then the target and avoidance structures were contoured.  Treatment planning then occurred.  The radiation prescription was entered and confirmed.  Then, I designed and supervised the construction of a total of 3 medically necessary complex treatment devices.  I have requested : Isodose Plan.  I have ordered: dose calc.  PLAN:  The patient will receive 30 Gy in 10 fractions.  -----------------------------------  Blair Promise, PhD, MD  This document serves as a record of services personally performed by Gery Pray, MD. It was created on his behalf by Darcus Austin, a trained medical scribe. The creation of this record is based on the scribe's personal observations and the provider's statements to them. This document has been checked and approved by the attending provider.

## 2016-04-26 NOTE — Telephone Encounter (Signed)
Will route to MD to make aware. 

## 2016-04-27 ENCOUNTER — Ambulatory Visit
Admit: 2016-04-27 | Discharge: 2016-04-27 | Disposition: A | Discharge status: Home / Self Care | Payer: Commercial Managed Care - HMO | Attending: Radiation Oncology | Admitting: Radiation Oncology

## 2016-04-27 ENCOUNTER — Telehealth: Payer: Self-pay

## 2016-04-27 ENCOUNTER — Ambulatory Visit: Payer: Commercial Managed Care - HMO

## 2016-04-27 LAB — CBC
HEMATOCRIT: 31 % — AB (ref 36.0–46.0)
Hemoglobin: 9.5 g/dL — ABNORMAL LOW (ref 12.0–15.0)
MCH: 27.8 pg (ref 26.0–34.0)
MCHC: 30.6 g/dL (ref 30.0–36.0)
MCV: 90.6 fL (ref 78.0–100.0)
Platelets: 246 10*3/uL (ref 150–400)
RBC: 3.42 MIL/uL — ABNORMAL LOW (ref 3.87–5.11)
RDW: 19.6 % — AB (ref 11.5–15.5)
WBC: 11.6 10*3/uL — ABNORMAL HIGH (ref 4.0–10.5)

## 2016-04-27 LAB — BASIC METABOLIC PANEL
ANION GAP: 12 (ref 5–15)
BUN: 36 mg/dL — ABNORMAL HIGH (ref 6–20)
CALCIUM: 9.7 mg/dL (ref 8.9–10.3)
CO2: 36 mmol/L — AB (ref 22–32)
CREATININE: 1.53 mg/dL — AB (ref 0.44–1.00)
Chloride: 95 mmol/L — ABNORMAL LOW (ref 101–111)
GFR calc Af Amer: 39 mL/min — ABNORMAL LOW (ref >60)
GFR calc non Af Amer: 34 mL/min — ABNORMAL LOW (ref >60)
GLUCOSE: 110 mg/dL — AB (ref 65–99)
Potassium: 2.9 mmol/L — ABNORMAL LOW (ref 3.5–5.1)
Sodium: 143 mmol/L (ref 135–145)

## 2016-04-27 LAB — GLUCOSE, CAPILLARY
GLUCOSE-CAPILLARY: 102 mg/dL — AB (ref 65–99)
GLUCOSE-CAPILLARY: 238 mg/dL — AB (ref 65–99)
Glucose-Capillary: 262 mg/dL — ABNORMAL HIGH (ref 65–99)
Glucose-Capillary: 295 mg/dL — ABNORMAL HIGH (ref 65–99)

## 2016-04-27 LAB — MAGNESIUM: Magnesium: 1.8 mg/dL (ref 1.7–2.4)

## 2016-04-27 MED ORDER — POTASSIUM CHLORIDE CRYS ER 20 MEQ PO TBCR
40.0000 meq | EXTENDED_RELEASE_TABLET | Freq: Two times a day (BID) | ORAL | Status: DC
  Administered 2016-04-28: 40 meq via ORAL
  Filled 2016-04-27: qty 2

## 2016-04-27 MED ORDER — POTASSIUM CHLORIDE CRYS ER 20 MEQ PO TBCR
40.0000 meq | EXTENDED_RELEASE_TABLET | ORAL | Status: AC
  Administered 2016-04-27 (×3): 40 meq via ORAL
  Filled 2016-04-27 (×3): qty 2

## 2016-04-27 NOTE — Progress Notes (Signed)
No charge note.  Patient's daughter was no show to scheduled 10am meeting. Patient is likely being discharged tomorrow. Patient is already enrolled with [facility] through [place] in the community. Will defer further [place] conversations to them and sign off from this case.  Thank you for allowing Palliative Medicine to participate in the care of this patient.   Mariana Kaufman, AGNP-C Palliative Medicine  Please call Palliative Medicine team phone with any questions [phone]. For individual providers please see AMION.

## 2016-04-27 NOTE — Progress Notes (Signed)
Pt's son, Georgina Snell, called this RN in to the room asking about pink form that he found in the room. AC, Lynetta Mare, called and came to speak with patient and son regarding form. Will continue to monitor pt closely. Carnella [place] I

## 2016-04-27 NOTE — Progress Notes (Signed)
[facility] Radiation Oncology Dept Therapy Treatment Record Phone [phone]   Radiation Therapy was administered to Ralene Cork on: 04/27/2016  10:57 AM and was treatment # 1 out of a planned course of 10 treatments.  Radiation Treatment  1). Beam photons with 6-10 energy and Photons 6-10 MeV  2). Brachytherapy None  3). Stereotactic Radiosurgery None  4). Other Radiation None     Belissa Kooy L, RT (T)

## 2016-04-27 NOTE — Telephone Encounter (Signed)
Pt called to let Dr Burr Medico know her Hgb today was 9.5.

## 2016-04-27 NOTE — Progress Notes (Signed)
Physical Therapy Treatment Patient Details Name: Anita [name] MRN: [id number] DOB: 30-Aug-1945 Today's Date: 04/27/2016    History of Present Illness 70 y/o female with metastatic breast cancer, severe COPD and systolic heart failure admitted on 8/24 with HCAP requriing intubation.     PT Comments    Assisted out of recliner to amb a greater distance in hallway.  No new c/o's  Follow Up Recommendations  SNF (pt declines SNF)     Equipment Recommendations  None recommended by PT    Recommendations for Other Services       Precautions / Restrictions Precautions Precautions: Fall Precaution Comments: oxygen, monitor VS,  home 02  Restrictions Weight Bearing Restrictions: No    Mobility  Bed Mobility               General bed mobility comments: pt OOB in recliner  Transfers Overall transfer level: Needs assistance Equipment used: None Transfers: Sit to/from Stand Sit to Stand: Supervision         General transfer comment: 25% VC's for safety.  Pt impulsive.  VC's for pt to "slow down".  Ambulation/Gait Ambulation/Gait assistance: Supervision Ambulation Distance (Feet): 58 Feet Assistive device: Rolling walker (2 wheeled)   Gait velocity: too quick   General Gait Details: 4 lts nasal avg 96% but HR increased from 89 to 134 with amb.  VC's to decrease gait speed "slow down".     Stairs            Wheelchair Mobility    Modified Rankin (Stroke Patients Only)       Balance                                    Cognition Arousal/Alertness: Awake/alert Behavior During Therapy: Impulsive Overall Cognitive Status: Within Functional Limits for tasks assessed                      Exercises      General Comments        Pertinent Vitals/Pain Pain Assessment: No/denies pain    Home Living                      Prior Function            PT Goals (current goals can now be found in the care plan section)  Progress towards PT goals: Progressing toward goals    Frequency  Min 3X/week    PT Plan Current plan remains appropriate    Co-evaluation             End of Session Equipment Utilized During Treatment: Gait belt;Oxygen Activity Tolerance: Treatment limited secondary to medical complications (Comment) Patient left: in chair;with call bell/phone within reach     Time: 1450-1515 PT Time Calculation (min) (ACUTE ONLY): 25 min  Charges:  $Gait Training: 8-22 mins $Therapeutic Activity: 8-22 mins                    G Codes:      Rica Koyanagi  PTA [facility]  Acute  Rehab Pager      [phone]

## 2016-04-27 NOTE — Progress Notes (Signed)
SLP Cancellation Note  Patient Details Name: Anita [name] MRN: [id number] DOB: 01/03/1946   Cancelled treatment:       Reason Eval/Treat Not Completed:  (pt walking with PT at this time, will reattempt at later time)   Luanna Salk, [facility] SLP [phone]

## 2016-04-27 NOTE — Progress Notes (Signed)
Anita [name]   DOB:June 09, 1946   ZO#:[id number]   JWJ#:[id number]  ONCOLOGY FOLLOW UP NOTE   Subjective: Anita [name] is doing moderately well overall. Dr. Sondra Come saw her yesterday, and did simulation to her left tibia bone lesion for palliative radiation, possible starting today. She has been diuresed, hemoglobin increased from 8.1 to 9.5 today without blood transfusion. Dyspnea is stable, no other new complaints.  Objective:  Vitals:   04/27/16 0503 04/27/16 0941  BP: (!) 131/54 (!) 120/53  Pulse: 87 (!) 113  Resp: 18 20  Temp: 98.2 F (36.8 C) 98.3 F (36.8 C)    Body mass index is 38.56 kg/m.  Intake/Output Summary (Last 24 hours) at 04/27/16 1139 Last data filed at 04/27/16 0900  Gross per 24 hour  Intake          1277.83 ml  Output               12 ml  Net          1265.83 ml     Sclerae unicteric  Oropharynx clear  No peripheral adenopathy  Lungs clear -- no rales or rhonchi  Heart regular rate and rhythm  Abdomen benign  MSK no focal spinal tenderness, no peripheral edema  Neuro nonfocal    CBG (last 3)   Recent Labs  04/26/16 1629 04/26/16 2222 04/27/16 0736  GLUCAP 303* 238* 102*     Labs:  Lab Results  Component Value Date   WBC 11.6 (H) 04/27/2016   HGB 9.5 (L) 04/27/2016   HCT 31.0 (L) 04/27/2016   MCV 90.6 04/27/2016   PLT 246 04/27/2016   NEUTROABS 7.3 04/19/2016    Urine Studies No results for input(s): UHGB, CRYS in the last 72 hours.  Invalid input(s): UACOL, UAPR, USPG, UPH, UTP, UGL, UKET, UBIL, UNIT, UROB, [place], UEPI, UWBC, [place], [place], [place], [place], [place]  Basic Metabolic Panel:  Recent Labs Lab 04/22/16 0329 04/23/16 0320 04/24/16 0540 04/26/16 0534 04/27/16 0521  NA 145 148* 149* 143 143  K 4.2 4.1 3.2* 2.9* 2.9*  CL 113* 113* 110 104 95*  CO2 '24 28 29 31 '$ 36*  GLUCOSE 164* 177* 134* 192* 110*  BUN 46* 45* 41* 33* 36*  CREATININE 1.50* 1.57* 1.35* 1.30* 1.53*  CALCIUM 7.8* 8.1* 8.4* 8.4* 9.7  MG 2.9* 2.8*  --   --  1.8  PHOS  5.6* 5.5*  --   --   --    GFR Estimated Creatinine Clearance: 37.9 mL/min (by C-G formula based on SCr of 1.53 mg/dL). Liver Function Tests: No results for input(s): AST, ALT, ALKPHOS, BILITOT, PROT, ALBUMIN in the last 168 hours. No results for input(s): LIPASE, AMYLASE in the last 168 hours. No results for input(s): AMMONIA in the last 168 hours. Coagulation profile No results for input(s): INR, PROTIME in the last 168 hours.  CBC:  Recent Labs Lab 04/23/16 0320 04/26/16 0534 04/27/16 0521  WBC 11.7* 9.7 11.6*  HGB 8.1* 8.1* 9.5*  HCT 27.4* 26.1* 31.0*  MCV 92.9 90.3 90.6  PLT 231 178 246   Cardiac Enzymes: No results for input(s): CKTOTAL, CKMB, CKMBINDEX, TROPONINI in the last 168 hours. BNP: Invalid input(s): POCBNP CBG:  Recent Labs Lab 04/26/16 0734 04/26/16 1151 04/26/16 1629 04/26/16 2222 04/27/16 0736  GLUCAP 163* 285* 303* 238* 102*   D-Dimer No results for input(s): DDIMER in the last 72 hours. Hgb A1c No results for input(s): HGBA1C in the last 72 hours. Lipid Profile No results for input(s):  CHOL, HDL, LDLCALC, TRIG, CHOLHDL, LDLDIRECT in the last 72 hours. Thyroid function studies No results for input(s): TSH, T4TOTAL, T3FREE, THYROIDAB in the last 72 hours.  Invalid input(s): FREET3 Anemia work up No results for input(s): VITAMINB12, FOLATE, FERRITIN, TIBC, IRON, RETICCTPCT in the last 72 hours. Microbiology Recent Results (from the past 240 hour(s))  Blood Culture (routine x 2)     Status: None   Collection Time: 04/19/16  4:18 PM  Result Value Ref Range Status   Specimen Description BLOOD RIGHT ARM  Final   Special Requests BOTTLES DRAWN AEROBIC ONLY 5CC  Final   Culture   Final    NO GROWTH 5 DAYS Performed at [facility]    Report Status 04/24/2016 FINAL  Final  Blood Culture (routine x 2)     Status: None   Collection Time: 04/19/16  4:50 PM  Result Value Ref Range Status   Specimen Description BLOOD RIGHT ARM  Final    Special Requests IN PEDIATRIC BOTTLE [place]  Final   Culture   Final    NO GROWTH 5 DAYS Performed at [facility]    Report Status 04/24/2016 FINAL  Final  MRSA PCR Screening     Status: Abnormal   Collection Time: 04/19/16 10:41 PM  Result Value Ref Range Status   MRSA by PCR POSITIVE (A) NEGATIVE Final    Comment:        The GeneXpert MRSA Assay (FDA approved for NASAL specimens only), is one component of a comprehensive MRSA colonization surveillance program. It is not intended to diagnose MRSA infection nor to guide or monitor treatment for MRSA infections. RESULT CALLED TO, READ BACK BY AND VERIFIED WITH: C.DENNY,RN 0211 04/20/16 [facility]   Urine culture     Status: None   Collection Time: 04/20/16 12:32 AM  Result Value Ref Range Status   Specimen Description URINE, RANDOM  Final   Special Requests NONE  Final   Culture NO GROWTH Performed at [facility]   Final   Report Status 04/21/2016 FINAL  Final      Studies:  Dg Chest 2 View  Result Date: 04/26/2016 CLINICAL DATA:  Respiratory failure.  Shortness of breath . EXAM: CHEST  2 VIEW COMPARISON:  04/23/2016. FINDINGS: Cardiomegaly with pulmonary vascular prominence and bilateral interstitial prominence and bilateral pleural effusions consistent with congestive heart failure is again noted. Findings have improved from prior exam. No pneumothorax. Old left rib fractures. IMPRESSION: Congestive heart failure bilateral from interstitial edema and bilateral pleural effusions, findings have improved from prior exam . Electronically Signed   By: [place]   On: 04/26/2016 09:31    Assessment: 69 y.o. female with metastatic breast cancer, severe COPD and systolic heart failure admitted on 8/24 with HCAP requriing intubation.   1. Acute respiratory failure with hypoxemia, pneumonia and COPD exacerbation, improving  2. Metastatic breast cancer to lung and bone, ER+/PR-/HER2- 3. CHF, CAD, HTN 4. CKD  5.  Anemia of chronic disease (CKD and malignancy) 6. Type 2 DM  7. Dysphagia, on dysphagia level III diet with honey thick liquid 9. Hypokalemia    Plan:  -Pt is clinically improving, possible going home in a few days  -I spoke with Dr. Sondra Come yesterday, agree with palliative RT to her left tibia lesion  -I will see her for follow up after her discharge, it's scheduled for 9/25, she does not return early   Truitt Merle, MD 04/27/2016  11:39 AM

## 2016-04-27 NOTE — Progress Notes (Cosign Needed)
Patient is ordered to be on strict I&O.  Patient had 2 occurrences of urine output mixed with stool.  These occurrences could not be properly measure but appeared to be approximately 300-400 mL each time.  Both were charted as occurrences with a note attached.

## 2016-04-27 NOTE — Progress Notes (Signed)
Pt refused cpap.  Pt was advised that RT is available all night and encouraged to call, should she change her mind.  RN notified.

## 2016-04-27 NOTE — Progress Notes (Signed)
PROGRESS NOTE                                                                                                                                                                                                             Patient Demographics:    Anita [name], is a 70 y.o. female, DOB - [birth year], LTR:[id number]  Admit date - 04/19/2016   Admitting Physician Rush Landmark, MD  Outpatient Primary MD for the patient is [name], DO  LOS - 8  Outpatient Specialists: Dr Burr Medico  Chief Complaint  Patient presents with  . Weakness  . Shortness of Breath       Brief Narrative   70 year old female with metastatic breast cancer stage IV, severe COPD with chronic hypoxemic respiratory failure (on 4 L/min O2 at home), systolic CHF with EF of 25 and 30%, hypertension presented with progressive shortness of breath, cough and subjective fever. Patient refused home inhalers without much relief. While she was at the [facility] she became increasingly short of breath and was found to have O2 sat of 81% on oxygen. She is brought to the ED where ABG showed pH of 7.29, PCO2 63, PO2 59 on 5 L nasal cannula. She was placed on BiPAP but went into respiratory distress and was intubated. Symptoms were likely triggered by healthcare associated pneumonia/recurrent aspiration. Patient transferred to hospitalist service on 8/29. Palliative care consulted for goals of care discussion.    Subjective:   Reports mild cough, dyspnea at baseline, Reports she is feeling better today.   Assessment  & Plan :    Acute on chronic respiratory failure with Hypoxia and dyspnea and hypercapnia (HCC) - Secondary to healthcare associated pneumonia/aspiration pneumonia and acute exacerbation of COPD. - Also underlying metastatic breast cancer to the lung contributing further to her worsening respiratory function. - Continue Rocephin (day 7/7)  cultures negative.  - Continue O2 ( 4L/ min ) and nebs. - Continue nebs, tapering steroid, will need prolonged taper. - Bedtime CPAP. - Seen by swallowing eval and recommend dysphagia level III diet with nectar thick  Chronic systolic CHF - EF of 25 and 30%. Continue Demadex .we will decrease his after receiving Cytoxan yesterday , weight down  223>210, but unfortunately no ins and outs recorded accurately .  Acute on chronic kidney disease stage III -  Improved. Continue monitor as on  home dose Demadex.  Hypernatremia - Possibly due to hypovolemia.monitor closely as on diuresis  Hypokalemia - Repleted , recheck in a.m.   Dysphagia - High risk for aspiration  aspiration. On dysphagia level III diet with nectar  thick liquid after SLP evaluation. 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 - Seen by Dr. Burr Medico. Recommends poor candidate for further chemotherapy. Holding chemotherapy for now. - Seen by radiation oncology for palliative RT to her left tibia lesion   Type 2 diabetes mellitus - CBGs uncontrolled while on insulin sliding scale, this is most likely due to steroids,better controlled after starting low-dose Lantus .  Anemia of chronic kidney disease - Recommendation to transfuse if hemoglobin less than 8 as discussed with Dr. Burr Medico, Hemoglobin  is 9.5 today.  Goals of care Oral prognosis is guarded. Palliative care spoke with patient and her daughter .    Code Status : Full code  Family Communication  : None at bedside  Disposition Plan  : PT recommend SNF.But patient wants to go home,anticipate discharge in 1-2 days after patient was appropriately diuresed  Barriers For Discharge : Improving symptoms, palliative care goals of care discussed  Consults  :   PCCM Oncology Dr Burr Medico Palliative care  Procedures  :  intubation  DVT Prophylaxis  :  Lovenox -   Lab Results  Component Value Date   PLT 246  04/27/2016    Antibiotics  :   Anti-infectives    Start     Dose/Rate Route Frequency Ordered Stop   04/21/16 0900  cefTRIAXone (ROCEPHIN) 2 g in dextrose 5 % 50 mL IVPB     2 g 100 mL/hr over 30 Minutes Intravenous Every 24 hours 04/21/16 0818 04/27/16 0919   04/20/16 1600  vancomycin (VANCOCIN) 1,250 mg in sodium chloride 0.9 % 250 mL IVPB  Status:  Discontinued     1,250 mg 166.7 mL/hr over 90 Minutes Intravenous Every 24 hours 04/19/16 1738 04/21/16 0818   04/20/16 0200  piperacillin-tazobactam (ZOSYN) IVPB 3.375 g  Status:  Discontinued     3.375 g 12.5 mL/hr over 240 Minutes Intravenous Every 8 hours 04/19/16 1739 04/21/16 0818   04/19/16 1645  piperacillin-tazobactam (ZOSYN) IVPB 3.375 g     3.375 g 100 mL/hr over 30 Minutes Intravenous  Once 04/19/16 1634 04/19/16 1835   04/19/16 1645  vancomycin (VANCOCIN) IVPB 1000 mg/200 mL premix     1,000 mg 200 mL/hr over 60 Minutes Intravenous  Once 04/19/16 1634 04/19/16 1912        Objective:   Vitals:   04/27/16 0503 04/27/16 0721 04/27/16 0722 04/27/16 0941  BP: (!) 131/54   (!) 120/53  Pulse: 87   (!) 113  Resp: 18   20  Temp: 98.2 F (36.8 C)   98.3 F (36.8 C)  TempSrc: Oral   Oral  SpO2: 100% 100% 100% 99%  Weight: 95.6 kg (210 lb 12.8 oz)     Height:        Wt Readings from Last 3 Encounters:  04/27/16 95.6 kg (210 lb 12.8 oz)  04/05/16 99.8 kg (220 lb)  03/22/16 95.3 kg (210 lb)     Intake/Output Summary (Last 24 hours) at 04/27/16 1211 Last data filed at 04/27/16 0900  Gross per 24 hour  Intake          1055.83 ml  Output  12 ml  Net          1043.83 ml     Physical Exam  Gen: Elderly obese female not in distress HEENT: no pallor, moist mucosa, supple neck Chest: Fine basilar crackles CVS: N S1&S2, no murmurs, rubs or gallop GI: soft, NT, ND, BS+ Musculoskeletal: warm, no edema CNS: Alert and oriented    Data Review:    CBC  Recent Labs Lab 04/23/16 0320 04/26/16 0534  04/27/16 0521  WBC 11.7* 9.7 11.6*  HGB 8.1* 8.1* 9.5*  HCT 27.4* 26.1* 31.0*  PLT 231 178 246  MCV 92.9 90.3 90.6  MCH 27.5 28.0 27.8  MCHC 29.6* 31.0 30.6  RDW 20.8* 19.6* 19.6*    Chemistries   Recent Labs Lab 04/22/16 0329 04/23/16 0320 04/24/16 0540 04/26/16 0534 04/27/16 0521  NA 145 148* 149* 143 143  K 4.2 4.1 3.2* 2.9* 2.9*  CL 113* 113* 110 104 95*  CO2 '24 28 29 31 '$ 36*  GLUCOSE 164* 177* 134* 192* 110*  BUN 46* 45* 41* 33* 36*  CREATININE 1.50* 1.57* 1.35* 1.30* 1.53*  CALCIUM 7.8* 8.1* 8.4* 8.4* 9.7  MG 2.9* 2.8*  --   --  1.8   ------------------------------------------------------------------------------------------------------------------ No results for input(s): CHOL, HDL, LDLCALC, TRIG, CHOLHDL, LDLDIRECT in the last 72 hours.  Lab Results  Component Value Date   HGBA1C 6.6 (H) 04/24/2016   ------------------------------------------------------------------------------------------------------------------ No results for input(s): TSH, T4TOTAL, T3FREE, THYROIDAB in the last 72 hours.  Invalid input(s): FREET3 ------------------------------------------------------------------------------------------------------------------ No results for input(s): VITAMINB12, FOLATE, FERRITIN, TIBC, IRON, RETICCTPCT in the last 72 hours.  Coagulation profile No results for input(s): INR, PROTIME in the last 168 hours.  No results for input(s): DDIMER in the last 72 hours.  Cardiac Enzymes No results for input(s): CKMB, TROPONINI, MYOGLOBIN in the last 168 hours.  Invalid input(s): CK ------------------------------------------------------------------------------------------------------------------    Component Value Date/Time   BNP 78.3 04/19/2016 1617   BNP 157.4 (H) 02/09/2016 1516    Inpatient Medications  Scheduled Meds: . acetaminophen  1,000 mg Oral Once  . budesonide (PULMICORT) nebulizer solution  0.5 mg Nebulization BID  . chlorhexidine  15 mL  Mouth Rinse BID  . clopidogrel  75 mg Oral Daily  . guaiFENesin  5 mL Oral QID  . heparin  5,000 Units Subcutaneous Q8H  . insulin aspart  0-9 Units Subcutaneous TID AC & HS  . insulin aspart  4 Units Subcutaneous TID WC  . insulin glargine  8 Units Subcutaneous Daily  . ipratropium-albuterol  3 mL Nebulization BID  . mouth rinse  15 mL Mouth Rinse q12n4p  . mirtazapine  15 mg Oral QHS  . pantoprazole  40 mg Oral BID  . potassium chloride  40 mEq Oral Q4H  . [START ON 04/28/2016] potassium chloride  40 mEq Oral BID  . predniSONE  30 mg Oral Q breakfast  . torsemide  40 mg Oral BID   Continuous Infusions:   PRN Meds:.albuterol, HYDROcodone-acetaminophen, RESOURCE THICKENUP CLEAR  Micro Results Recent Results (from the past 240 hour(s))  Blood Culture (routine x 2)     Status: None   Collection Time: 04/19/16  4:18 PM  Result Value Ref Range Status   Specimen Description BLOOD RIGHT ARM  Final   Special Requests BOTTLES DRAWN AEROBIC ONLY 5CC  Final   Culture   Final    NO GROWTH 5 DAYS Performed at [facility]    Report Status 04/24/2016 FINAL  Final  Blood Culture (routine  x 2)     Status: None   Collection Time: 04/19/16  4:50 PM  Result Value Ref Range Status   Specimen Description BLOOD RIGHT ARM  Final   Special Requests IN PEDIATRIC BOTTLE [place]  Final   Culture   Final    NO GROWTH 5 DAYS Performed at [facility]    Report Status 04/24/2016 FINAL  Final  MRSA PCR Screening     Status: Abnormal   Collection Time: 04/19/16 10:41 PM  Result Value Ref Range Status   MRSA by PCR POSITIVE (A) NEGATIVE Final    Comment:        The GeneXpert MRSA Assay (FDA approved for NASAL specimens only), is one component of a comprehensive MRSA colonization surveillance program. It is not intended to diagnose MRSA infection nor to guide or monitor treatment for MRSA infections. RESULT CALLED TO, READ BACK BY AND VERIFIED WITH: C.DENNY,RN 0211 04/20/16 [facility]     Urine culture     Status: None   Collection Time: 04/20/16 12:32 AM  Result Value Ref Range Status   Specimen Description URINE, RANDOM  Final   Special Requests NONE  Final   Culture NO GROWTH Performed at [facility]   Final   Report Status 04/21/2016 FINAL  Final    Radiology Reports Dg Chest 2 View  Result Date: 04/26/2016 CLINICAL DATA:  Respiratory failure.  Shortness of breath . EXAM: CHEST  2 VIEW COMPARISON:  04/23/2016. FINDINGS: Cardiomegaly with pulmonary vascular prominence and bilateral interstitial prominence and bilateral pleural effusions consistent with congestive heart failure is again noted. Findings have improved from prior exam. No pneumothorax. Old left rib fractures. IMPRESSION: Congestive heart failure bilateral from interstitial edema and bilateral pleural effusions, findings have improved from prior exam . Electronically Signed   By: Marcello Moores  Register   On: 04/26/2016 09:31   Ct Chest Wo Contrast  Result Date: 04/09/2016 CLINICAL DATA:  Right breast cancer with bone and lung metastasis. Ongoing oral chemotherapy. COPD. Shortness of breath. EXAM: CT CHEST WITHOUT CONTRAST TECHNIQUE: Multidetector CT imaging of the chest was performed following the standard protocol without IV contrast. COMPARISON:  PET of 02/27/2016 FINDINGS: Cardiovascular: Aortic and branch vessel atherosclerosis. Tortuous thoracic aorta. Moderate cardiomegaly with coronary artery atherosclerosis. Pulmonary artery enlargement, including 3.2 cm outflow tract. Mediastinum/Nodes: 8 mm subcarinal node on image 59/ series 2. This is not pathologic by size criteria, but enlarged since the prior. Hilar regions poorly evaluated secondary to lack of contrast and motion. Lungs/Pleura: No pleural fluid. Mild to moderate motion degradation throughout. Scattered sub cm pulmonary nodules are suboptimally evaluated secondary to motion, grossly similar to on the prior. Index right upper lobe pulmonary nodule  measures 8 x 10 mm on image 58/series 7 versus 8 x 6 mm on the prior exam (when remeasured). Right base airspace disease is likely atelectasis. Upper Abdomen: Moderate hepatic steatosis. Probable hepatomegaly, incompletely imaged. An area of relative hyper attenuation in the lateral segment left liver lobe measures 1.5 cm on image 137/series 2. Similar to on the prior PET, and not hypermetabolic. Normal imaged portions of the spleen, stomach, pancreas, adrenal glands. Musculoskeletal: Widespread osseous metastasis. Example lesion within the left lamina of T10 measures 11 mm on image 100/series 2 and is not significantly changed. Right-sided sternal lesion measures on the order of 3.3 cm versus similar on the prior exam (when remeasured). Pathologic fractures involving bilateral ribs. No vertebral body height loss. IMPRESSION: 1. Moderately motion degraded exam. 2. Grossly similar  large volume osseous metastasis. 3. Grossly similar bilateral pulmonary nodules since 03/25/2016. Extremely poorly evaluated. 4. New subcarinal node is not pathologic by size criteria but warrants followup attention. 5. Cardiomegaly. Coronary artery atherosclerosis. Aortic atherosclerosis. 6. Pulmonary artery enlargement suggests pulmonary arterial hypertension. 7. Hepatic steatosis. A 1.5 cm relatively hyper attenuating left liver lobe "Lesion" is similar to on the prior exam and may represent an area of fat sparing (given lack of hypermetabolism). This also warrants followup attention. Electronically Signed   By: Abigail Miyamoto M.D.   On: 04/09/2016 15:41   Dg Chest Port 1 View  Result Date: 04/23/2016 CLINICAL DATA:  Pneumonia. EXAM: PORTABLE CHEST 1 VIEW COMPARISON:  04/22/2016. FINDINGS: Cardiomegaly with progressive bilateral pulmonary infiltrates and small pleural effusions. Findings suggest congestive heart failure. Bilateral pneumonia cannot be excluded. No pneumothorax. Old left posterior seventh rib fracture. IMPRESSION:  Cardiomegaly with progressive bilateral pulmonary infiltrates and small pleural effusions. Findings suggest congestive heart failure. Bilateral pneumonia cannot be excluded. Electronically Signed   By: Marcello Moores  Register   On: 04/23/2016 06:49   Dg Chest Port 1 View  Result Date: 04/22/2016 CLINICAL DATA:  Pneumonia.  History of breast cancer. EXAM: PORTABLE CHEST 1 VIEW COMPARISON:  04/20/2016 FINDINGS: Cardiomegaly with vascular congestion. Diffuse bilateral interstitial and alveolar opacities, right greater than left, not significantly changed. Suspect small effusions. Interval extubation and removal of NG tube. IMPRESSION: Diffuse bilateral interstitial and alveolar opacities, right greater than left, not significantly changed. This could represent asymmetric edema or infection. Electronically Signed   By: Rolm Baptise M.D.   On: 04/22/2016 07:11   Dg Chest Port 1 View  Result Date: 04/20/2016 CLINICAL DATA:  Respiratory failure. EXAM: PORTABLE CHEST 1 VIEW COMPARISON:  04/19/2016.  Chest CT 04/09/2016.   PET-CT 02/27/2016. FINDINGS: Endotracheal tube and NG tube in stable position. Stable cardiomegaly. Right hilar fullness. Adenopathy cannot be excluded. Progressive right lower lobe infiltrate and/or atelectasis. Bilateral pulmonary nodules, these are best identified by prior CT. Small right pleural effusion. No pneumothorax. IMPRESSION: 1.  Endotracheal tube and NG tube in stable position. 2. Progressive right lower lobe atelectasis and or infiltrate. Small right pleural effusion. 3. Pulmonary nodules again noted. These are best identified by prior CT. Mild right hilar fullness. This may be vascular. Right hilar adenopathy cannot be excluded. 4. Stable cardiomegaly. Electronically Signed   By: Marcello Moores  Register   On: 04/20/2016 06:52   Dg Chest Portable 1 View  Result Date: 04/19/2016 CLINICAL DATA:  Status post intubation EXAM: PORTABLE CHEST 1 VIEW COMPARISON:  Chest radiograph dated 04/19/2016 at  1518 hour. CT chest dated age [age]. FINDINGS: Endotracheal tube terminates 3 cm above the carina. Increased interstitial markings, favoring mild interstitial edema. Mild patchy right lower lobe opacity, atelectasis versus pneumonia. Possible small right pleural effusion. No pneumothorax. Known pulmonary nodules/metastases are not radiographically evident. Cardiomegaly. Enteric tube courses into the stomach. IMPRESSION: Endotracheal tube terminates 3 cm above the carina. Suspected mild interstitial edema with possible small right pleural effusion. Mild patchy right lower lobe opacity, atelectasis versus pneumonia. Electronically Signed   By: Julian Hy M.D.   On: 04/19/2016 19:46   Dg Chest Port 1 View  Result Date: 04/19/2016 CLINICAL DATA:  Short of breath. History of metastatic breast cancer and COPD EXAM: PORTABLE CHEST 1 VIEW COMPARISON:  02/01/2016.  Chest CT 04/09/2016 FINDINGS: Cardiac enlargement. Interval development of diffuse bilateral airspace disease since the prior chest x-ray. Lungs were also clear on the recent chest CT. Findings suggest congestive  heart failure with mild edema. Right lower lobe airspace disease likely represents atelectasis but could represent pneumonia. No pleural effusion. No known metastatic disease to bone. IMPRESSION: Interval development of bilateral airspace disease, most likely congestive heart failure with edema Right lower lobe atelectasis/ pneumonia Electronically Signed   By: Franchot Gallo M.D.   On: 04/19/2016 16:55   Dg Swallowing Func-speech Pathology  Result Date: 04/22/2016 Objective Swallowing Evaluation: Type of Study: MBS-Modified Barium Swallow Study Patient Details Name: SHABANA ARMENTROUT MRN: [id number] Date of Birth: 01-Feb-1946 Today's Date: 04/22/2016 Time: SLP Start Time (ACUTE ONLY): 1340-SLP Stop Time (ACUTE ONLY): 1415 SLP Time Calculation (min) (ACUTE ONLY): 35 min Past Medical History: Past Medical History: Diagnosis Date . Anemia   a. Adm  09/2012 with melena, Hgb 5.8 -> transfused. EGD/colonoscopy unrevealing. . Anemia of chronic disease  . Breast cancer ([place])   a. Mets to bone. ER 100%/ PR 0%/Her2 neu negative. [name] CAD (coronary artery disease)   a. 08/2012 NSTEMI: Med Rx; b. 10/2012 MV: EF 49%, old inf infarct;  c. 02/2013 NSTEMI-> Med Rx; d. 06/2013 Cath: LM nl, LAD 40p, 80-85p/m, D1 nl, LCX 20p, OM1 nl, RCA 100d w/ L->R collats, EF 30-35%, basal to mid inf AK, ant HK. . Chronic diastolic CHF (congestive heart failure) ([place])   a. 01/2014 Echo: EF 55%, triv PR. [name] Chronic respiratory failure ([place])   a. On O2 qhs and w/ exertion. 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 CKD (chronic kidney disease), stage IV ([place])  . COPD (chronic obstructive pulmonary disease) ([place])   a. Uses Home O2 w/ exertion. . Dementia   very mild . Depression  . Diabetes mellitus without complication ([place])  . Fibroid  . History of tobacco abuse   a. Quit 2014. [name] Hyperlipidemia 02/11/2013 . Lesion of vocal cord   a. CT 05/2013 concerning for tumor. . Morbid obesity ([place])  . Radiation 02/02/14-02/24/14  left and right femur 30 gray . Ulcers of both lower legs [facility])  Past Surgical History: Past Surgical History: Procedure Laterality Date . COLONOSCOPY, ESOPHAGOGASTRODUODENOSCOPY (EGD) AND ESOPHAGEAL DILATION N/A 10/13/2012  Procedure: colonoscopy and egd;  Surgeon: Wonda Horner, MD;  Location: [place];  Service: Endoscopy;  Laterality: N/A; . LEFT HEART CATHETERIZATION WITH CORONARY ANGIOGRAM N/A 07/07/2013  Procedure: LEFT HEART CATHETERIZATION WITH CORONARY ANGIOGRAM;  Surgeon: Peter M Martinique, MD;  Location: [facility] CATH LAB;  Service: Cardiovascular;  Laterality: N/A; HPI: 70 yo female adm to [facility] with respiratory deficits - requiring intubation.  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    Pt extubated within one day of admit.     Subjective: pt awake  Assessment / Plan / Recommendation CHL IP CLINICAL IMPRESSIONS 04/22/2016 Therapy Diagnosis Severe pharyngeal phase dysphagia;Moderate pharyngeal phase dysphagia Clinical Impression Pt demonstrates a moderate impairment due to delay in swallow trigger. Pt consistently aspirated thin liquids before the swallow regardless of bolus size or chin tuck.  Sensation was slightly delayed, as liquids hit the mid-upper trachea. Penetration events were not sensed. Suspect sensory impairment in the base of tongue and laryngeal vestibule and glottis impacting function, as well as compromised respiration; pt was quite fatigued after efforts to transfer to the chair during the test. Recommend dys 3 (mechanical soft) solids with honey thick liquids, though expect pt could tolerate nectar thick liquids with small sips when respiratory  function improves. Will follow for tolerance and progress.  Impact on safety and function Moderate aspiration risk   CHL IP TREATMENT RECOMMENDATION 04/22/2016 Treatment Recommendations Therapy as outlined in treatment plan below   Prognosis 04/22/2016 Prognosis for Safe Diet Advancement Good Barriers to Reach Goals -- Barriers/Prognosis Comment -- CHL IP DIET RECOMMENDATION 04/22/2016 SLP Diet Recommendations Dysphagia 3 (Mech soft) solids;Honey thick liquids Liquid Administration via Cup;Spoon Medication Administration Whole meds with puree Compensations Small sips/bites;Slow rate Postural Changes Seated upright at 90 degrees   CHL IP OTHER RECOMMENDATIONS 04/22/2016 Recommended Consults -- Oral Care Recommendations Oral care BID Other Recommendations Order thickener from pharmacy   CHL IP FOLLOW UP RECOMMENDATIONS 04/22/2016 Follow up Recommendations Skilled Nursing facility   [facility] IP FREQUENCY AND DURATION 04/22/2016 Speech Therapy Frequency (ACUTE ONLY) min 2x/week Treatment Duration 2 weeks      CHL IP ORAL PHASE 04/22/2016 Oral Phase Impaired Oral - Pudding Teaspoon -- Oral - Pudding Cup -- Oral - Honey  Teaspoon -- Oral - Honey Cup -- Oral - Nectar Teaspoon -- Oral - Nectar Cup -- Oral - Nectar Straw -- Oral - Thin Teaspoon -- Oral - Thin Cup -- Oral - Thin Straw -- Oral - Puree -- Oral - Mech Soft -- Oral - Regular -- Oral - Multi-Consistency -- Oral - Pill -- Oral Phase - Comment mild impairment due to missing dentition, thrusting movement with solids  CHL IP PHARYNGEAL PHASE 04/22/2016 Pharyngeal Phase Impaired Pharyngeal- Pudding Teaspoon -- Pharyngeal -- Pharyngeal- Pudding Cup -- Pharyngeal -- Pharyngeal- Honey Teaspoon -- Pharyngeal -- Pharyngeal- Honey Cup Delayed swallow initiation-vallecula Pharyngeal -- Pharyngeal- Nectar Teaspoon -- Pharyngeal -- Pharyngeal- Nectar Cup Delayed swallow initiation-vallecula;Delayed swallow initiation-pyriform sinuses;Penetration/Aspiration before swallow;Trace aspiration Pharyngeal Material enters airway, passes BELOW cords without attempt by patient to eject out (silent aspiration);Material does not enter airway Pharyngeal- Nectar Straw -- Pharyngeal -- Pharyngeal- Thin Teaspoon Delayed swallow initiation-pyriform sinuses;Penetration/Aspiration before swallow Pharyngeal Material enters airway, passes BELOW cords and not ejected out despite cough attempt by patient Pharyngeal- Thin Cup Delayed swallow initiation-pyriform sinuses;Penetration/Aspiration before swallow;Moderate aspiration Pharyngeal Material enters airway, passes BELOW cords and not ejected out despite cough attempt by patient Pharyngeal- Thin Straw Delayed swallow initiation-pyriform sinuses;Significant aspiration (Amount);Penetration/Aspiration before swallow;Compensatory strategies attempted (with notebox) Pharyngeal Material enters airway, passes BELOW cords and not ejected out despite cough attempt by patient Pharyngeal- Puree Delayed swallow initiation-vallecula Pharyngeal -- Pharyngeal- Mechanical Soft Delayed swallow initiation-vallecula Pharyngeal -- Pharyngeal- Regular -- Pharyngeal -- Pharyngeal-  Multi-consistency -- Pharyngeal -- Pharyngeal- Pill Delayed swallow initiation-vallecula Pharyngeal -- Pharyngeal Comment --  No flowsheet data found. No flowsheet data found. Herbie Baltimore, [place] [phone] Lynann Beaver 04/22/2016, 2:37 PM                [facility], Arvetta Araque M.D on 04/27/2016 at 12:11 PM  Between 7am to 7pm - Pager - [phone]  After 7pm go to [email or web address] - password [facility]  [facility] -  Office  [phone]

## 2016-04-28 LAB — BASIC METABOLIC PANEL
Anion gap: 14 (ref 5–15)
BUN: 47 mg/dL — AB (ref 6–20)
CHLORIDE: 94 mmol/L — AB (ref 101–111)
CO2: 34 mmol/L — ABNORMAL HIGH (ref 22–32)
Calcium: 9.9 mg/dL (ref 8.9–10.3)
Creatinine, Ser: 1.78 mg/dL — ABNORMAL HIGH (ref 0.44–1.00)
GFR calc Af Amer: 33 mL/min — ABNORMAL LOW (ref >60)
GFR, EST NON AFRICAN AMERICAN: 28 mL/min — AB (ref >60)
GLUCOSE: 175 mg/dL — AB (ref 65–99)
POTASSIUM: 3.4 mmol/L — AB (ref 3.5–5.1)
Sodium: 142 mmol/L (ref 135–145)

## 2016-04-28 LAB — CBC
HCT: 31.4 % — ABNORMAL LOW (ref 36.0–46.0)
Hemoglobin: 9.5 g/dL — ABNORMAL LOW (ref 12.0–15.0)
MCH: 27.5 pg (ref 26.0–34.0)
MCHC: 30.3 g/dL (ref 30.0–36.0)
MCV: 90.8 fL (ref 78.0–100.0)
PLATELETS: 255 10*3/uL (ref 150–400)
RBC: 3.46 MIL/uL — AB (ref 3.87–5.11)
RDW: 19.7 % — ABNORMAL HIGH (ref 11.5–15.5)
WBC: 11.4 10*3/uL — ABNORMAL HIGH (ref 4.0–10.5)

## 2016-04-28 MED ORDER — RESOURCE THICKENUP CLEAR PO POWD: ORAL | 2 refills | Status: AC

## 2016-04-28 MED ORDER — PREDNISONE 10 MG PO TABS: ORAL_TABLET | ORAL | 0 refills | Status: AC

## 2016-04-28 MED ORDER — INSULIN PEN NEEDLE 29G X 10MM MISC: 0 refills | Status: AC

## 2016-04-28 MED ORDER — INSULIN GLARGINE 100 UNIT/ML SOLOSTAR PEN: 8.0000 [IU] | PEN_INJECTOR | Freq: Every day | SUBCUTANEOUS | 1 refills | Status: AC

## 2016-04-28 MED ORDER — TORSEMIDE 20 MG PO TABS: 60.0000 mg | ORAL_TABLET | Freq: Two times a day (BID) | ORAL | 0 refills | Status: AC

## 2016-04-28 MED ORDER — METOLAZONE 2.5 MG PO TABS: ORAL_TABLET | ORAL | 0 refills | Status: AC

## 2016-04-28 NOTE — Discharge Summary (Signed)
Anita [name], is a 70 y.o. female  DOB 11-18-1945  MRN [id number].  Admission date:  04/19/2016  Admitting Physician  Rush Landmark, MD  Discharge Date:  04/28/2016   Primary MD  [name], DO  Recommendations for primary care physician for things to follow:  - Please check CBC, BMP, 2 view chest x-ray during next visit. - Monitorher volume status and adjust diuresis as needed, torsemide has been increased to 60 mg twice a day, started on Zaroxolyn 3 days a week. 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 - Patient started on Lantus, titrate dose as needed given she is on steroid taper. - Patient to continue following SLP as an outpatient, currently on dysphagia 3 with nectar thick   Admission Diagnosis  SOB (shortness of breath) [R06.02] Left shift [D72.825] Severe sepsis (HCC) [A41.9, R65.20] Acute on chronic respiratory failure with hypercapnia (HCC) [J96.22]   Discharge Diagnosis  SOB (shortness of breath) [R06.02] Left shift [D72.825] Severe sepsis (HCC) [A41.9, R65.20] Acute on chronic respiratory failure with hypercapnia (HCC) [J96.22]    Principal Problem:   Acute on chronic respiratory failure with hypoxia and hypercapnia (HCC) Active Problems:   Anemia of chronic disease   Acute on chronic respiratory failure with hypercapnia (HCC)   HCAP (healthcare-associated pneumonia)   Left shift   Bilateral pneumonia   Acute renal failure superimposed on stage 3 chronic kidney disease (HCC)   Hypernatremia   Hypokalemia   Dysphagia   DNR (do not resuscitate) discussion      Past Medical History:  Diagnosis Date  . Anemia    a. Adm 09/2012 with melena, Hgb 5.8 -> transfused. EGD/colonoscopy unrevealing.  . Anemia of chronic disease   . Breast cancer ([place])    a. Mets to bone. ER 100%/ PR 0%/Her2 neu negative.  [name] CAD (coronary artery  disease)    a. 08/2012 NSTEMI: Med Rx; b. 10/2012 MV: EF 49%, old inf infarct;  c. 02/2013 NSTEMI-> Med Rx; d. 06/2013 Cath: LM nl, LAD 40p, 80-85p/m, D1 nl, LCX 20p, OM1 nl, RCA 100d w/ L->R collats, EF 30-35%, basal to mid inf AK, ant HK.  . Chronic diastolic CHF (congestive heart failure) ([place])    a. 01/2014 Echo: EF 55%, triv PR.  [name] Chronic respiratory failure ([place])    a. On O2 qhs and w/ exertion.  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 CKD (chronic kidney disease), stage IV ([place])   . COPD (chronic obstructive pulmonary disease) ([place])    a. Uses Home O2 w/ exertion.  . Dementia    very mild  . Depression   . Diabetes mellitus without complication ([place])   . Fibroid   . History of tobacco abuse    a. Quit 2014.  [name] Hyperlipidemia 02/11/2013  . Lesion of vocal cord    a. CT 05/2013 concerning for tumor.  . Morbid obesity ([place])   . Radiation  02/02/14-02/24/14   left and right femur 30 gray  . Ulcers of both lower legs [facility])     Past Surgical History:  Procedure Laterality Date  . COLONOSCOPY, ESOPHAGOGASTRODUODENOSCOPY (EGD) AND ESOPHAGEAL DILATION N/A 10/13/2012   Procedure: colonoscopy and egd;  Surgeon: Wonda Horner, MD;  Location: [facility];  Service: Endoscopy;  Laterality: N/A;  . LEFT HEART CATHETERIZATION WITH CORONARY ANGIOGRAM N/A 07/07/2013   Procedure: LEFT HEART CATHETERIZATION WITH CORONARY ANGIOGRAM;  Surgeon: Peter M Martinique, MD;  Location: [facility] CATH LAB;  Service: Cardiovascular;  Laterality: N/A;       History of present illness and  Hospital Course:     Kindly see H&P for history of present illness and admission details, please review complete Labs, Consult reports and Test reports for all details in brief  HPI  from the history and physical done on the day of admission 04/19/2016  HISTORY OF PRESENT ILLNESS:   Patient is encephalopathic so I was not able to interview her. I discussed the case with the patient's husband and  daughter. Chart review was done as well.  70 year old female, follows with Dr. Halford Chessman,  with past medical history of st IV breast cancer, chronic hypoxemic resp fx,  severe COPD, CHF systolic with EF 16-10%, hypertension who presents with shortness of breath. Patient states over last few weeks she's had progressively worsening shortness of breath and cough.  Subjective fevers. She has been trying to treat herself at home for this, but has had no improvement with her home inhalers over the last 24 hours. She's had worsening of her shortness of breath and has had generalized weakness.   Patient was at the [facility] and was walking to the bathroom when she had increased SOB, more than baseline. Patient normally wears O2 4L/min via [place]. Sats are 81% with the O2 on. She was brought to ED. At the emergency room, initial ABG showed pH of 7.29, PCO2 of 63, PO2 59 on 5 L nasal cannula. She was placed on BiPAP but her respiratory distress worsened. She was intubated. No issues post intubation. Currently sedated and fentanyl drip.  She is currently finishing her fluid resuscitation at 30 mils per kilogram.  Pt had a rpt ABG which showed pH of 7.18, PCO2 of 74, PO2 of 210. Ventilatory changes have been made.  PCCM was asked to admit.   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  PAST SURGICAL HISTORY: She  has a past surgical history that  includes Colonoscopy, esophagogastroduodenoscopy (egd) and esophageal dilation (N/A, 10/13/2012) and left heart catheterization with coronary angiogram (N/A, 07/07/2013).       Allergies  Allergen Reactions  . Omnipaque [Iohexol] Hives and Other (See Comments)    Pt claims she developed hives after      Hospital Course  70 year old female with metastatic breast cancer stage IV, severe COPD with chronic hypoxemic respiratory failure (on 4 L/min O2 at home), systolic CHF with EF of 25 and 30%, hypertension presented with progressive shortness of breath, cough and subjective  fever. Patient refused home inhalers without much relief. While she was at the [facility] she became increasingly short of breath and was found to have O2 sat of 81% on oxygen. She is brought to the ED where ABG showed pH of 7.29, PCO2 63, PO2 59 on 5 L nasal cannula. She was placed on BiPAP but went into respiratory distress and was intubated. Symptoms were likely triggered by healthcare associated pneumonia/recurrent aspiration. Patient transferred to hospitalist service on 8/29. Palliative care consulted for goals of care discussion.  Acute on chronic respiratory failure with Hypoxia and dyspnea and hypercapnia (HCC) - Secondary to healthcare associated pneumonia/aspiration pneumonia and acute exacerbation of COPD. - Also underlying metastatic breast cancer to the lung contributing further to her worsening respiratory function. - Treated with Rocephin Rocephin (day 7/7) cultures negative.  - Continue O2 ( 4L/ min ) and nebs. - Continue nebs, tapering steroid, will need prolonged taper over next 12 days. - Bedtime CPAP. - Seen by swallowing eval and recommend dysphagia level III diet with nectar thick - Respiratory back at baseline 4 L nasal cannula.  Chronic systolic CHF - EF of 25 and 30%. Continue with metoprolol, methadone ACEI or ARB given her renal failure - Patient appropriate diuresis during hospital stay,  torsemide was increased from 40 twice a day to 60 twice a day as an outpatient, and was started on Zaroxolyn 3 times weekly, monitor renal function and volume status as an outpatient.  Acute on chronic kidney disease stage III - Baseline creatinine is 1.5-2, but  when appropriately diuresed and euvolemic, her baseline creatinine would be  1.7-2  Hypernatremia - Resolved with D5W  Hypokalemia - Repleted , resume supplements on discharge  Dysphagia - High risk for aspiration  aspiration. On dysphagia level III diet with nectar  thick liquid after SLP evaluation. 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 - Seen by Dr. Mosetta Putt. Recommends poor candidate for further chemotherapy. Holding chemotherapy for now. - Seen by radiation oncology for palliative RT to her left tibia lesion , continue to follow as an outpatient,  Type 2 diabetes mellitus - CBGs uncontrolled while on insulin sliding scale, this is most likely due to steroids,better controlled after starting low-dose Lantus . We'll discharge on Lantus will start pen 8 units at bedtime.  Anemia of chronic kidney disease - Recommendation to transfuse if hemoglobin less than 8 as discussed with Dr. Mosetta Putt, Hemoglobin  is 9.5 today.  Goals of care - Overall prognosis is guarded. Palliative care spoke with patient and her daughter .     Discharge Condition:  Stable for discharge, PT recommendation if placement, patient is adamant about going home, so patient is high risk for readmission. Discussed with husband   Follow UP  Follow-up Information    [facility] .   Specialty:  Home Health Services Why:  HH nursing, physical therapy, social worker, speech therapy. Contact information: [address] [place] [address] [phone]        De Hollingshead, DO .   Specialty:  Family Medicine Contact information: [address]  [place] [address] [phone]        Malachy Mood, MD .   Specialties:  Hematology, Oncology Contact information: [address] [place] [address] [phone]        Antony Blackbird, MD .   Specialty:  Radiation Oncology Contact information: [address]  [place] [address] [phone]             Discharge Instructions  and  Discharge Medications     Discharge Instructions    Discharge instructions    Complete by:  As directed   Follow with Primary MD [name], DO in 7 days   Get CBC, CMP, 2 view Chest X ray checked  by Primary MD next visit.    Activity: As tolerated with Full fall precautions use walker/cane & assistance as needed   Disposition Home    Diet: Heart Healthy , carbohydrate modified, dysphagia 3 with nectar thick, with fluid restriction 1500 mL per day , with feeding assistance and aspiration precautions.  For Heart failure patients - Check your Weight same time everyday, if you gain over 2 pounds, or you develop in leg swelling, experience more shortness of breath or chest pain, call your Primary MD immediately. Follow Cardiac Low Salt Diet and 1.5 lit/day fluid restriction.   On your next visit with your primary care physician please Get Medicines reviewed and adjusted.   Please request your Prim.MD to go over all Hospital Tests and Procedure/Radiological results at the follow up, please get all Hospital records sent to your Prim MD by signing hospital release before you go home.   If you experience worsening of your admission symptoms, develop shortness of breath, life threatening emergency, suicidal or homicidal thoughts you must seek medical attention immediately by calling 911 or calling your MD immediately  if symptoms less severe.  You Must read complete instructions/literature along with all the possible adverse reactions/side effects for all the Medicines you take and that have been prescribed to you.  Take any new Medicines after you have completely understood and accpet all the possible adverse reactions/side effects.   Do not drive, operating heavy machinery, perform activities at heights, swimming or participation in water activities or provide baby sitting services if your were admitted for syncope or siezures until you have seen by Primary MD or a Neurologist and advised to do so again.  Do not drive when taking Pain medications.    Do not take more than prescribed Pain, Sleep and Anxiety Medications  Special Instructions: If you have smoked or chewed Tobacco  in the last 2 yrs please stop smoking, stop any regular Alcohol  and or any Recreational drug use.  Wear Seat belts while driving.   Please note  You were cared for by a hospitalist during your hospital stay. If you have any questions about your discharge medications or the care you received while you were in the hospital after you are discharged, you can call the unit and asked to speak with the hospitalist on call if the hospitalist that took care of you is not available. Once you are discharged, your primary care physician will handle any further medical issues. Please note that NO REFILLS for any discharge medications will be authorized once you are discharged, as it is imperative that you return to your primary care physician (or establish a relationship with a primary care physician if you do not have one) for your aftercare needs so that they can reassess your need for medications and monitor your lab values.   Increase activity slowly    Complete by:  As directed       Medication List    STOP taking these medications   clotrimazole 1 % cream Commonly known as:  LOTRIMIN   palbociclib 100 MG capsule Commonly known  as:  IBRANCE     TAKE these medications   acetaminophen 325 MG tablet Commonly known as:  TYLENOL Take 2 tablets (650 mg total) by mouth every 4 (four) hours as needed for headache or mild pain.    albuterol 108 (90 Base) MCG/ACT inhaler Commonly known as:  PROVENTIL HFA;VENTOLIN HFA Inhale 2 puffs into the lungs every 6 (six) hours as needed for wheezing or shortness of breath.   albuterol (2.5 MG/3ML) 0.083% nebulizer solution Commonly known as:  PROVENTIL Inhale 3 mLs into the lungs every 6 (six) hours as needed for shortness of breath or wheezing.   aspirin 81 MG EC tablet Take 1 tablet (81 mg total) by mouth daily.   atorvastatin 40 MG tablet Commonly known as:  LIPITOR TAKE 1 TABLET EVERY DAY   bag balm Oint ointment Apply 1 g topically as needed for dry skin or irritation.   Cetirizine HCl 10 MG Caps Take 1 capsule (10 mg total) by mouth daily.   clopidogrel 75 MG tablet Commonly known as:  PLAVIX Take 1 tablet (75 mg total) by mouth daily.   FASLODEX 250 MG/5ML injection Generic drug:  fulvestrant Inject 250 mg into the muscle every 30 (thirty) days. Cancer center   fluticasone 50 MCG/ACT nasal spray Commonly known as:  FLONASE Place 2 sprays into both nostrils daily. What changed:  when to take this  reasons to take this   HYDROcodone-acetaminophen 5-325 MG tablet Commonly known as:  NORCO/VICODIN Take 1 tablet by mouth every 6 (six) hours as needed for moderate pain.   hydrocortisone 2.5 % ointment Apply topically 2 (two) times daily.   HYDROmorphone 2 MG tablet Commonly known as:  DILAUDID Take 1 tablet (2 mg total) by mouth every 4 (four) hours as needed for severe pain.   Insulin Glargine 100 UNIT/ML Solostar Pen Commonly known as:  LANTUS Inject 8 Units into the skin daily at 10 pm.   Insulin Pen Needle 29G X 10MM Misc Use with insulin pen   isosorbide mononitrate 60 MG 24 hr tablet Commonly known as:  IMDUR Take 1 tablet (60 mg total) by mouth daily.   metolazone 2.5 MG tablet Commonly known as:  ZAROXOLYN Please take 2.5 mg oral every Monday, Wednesday and Friday.   metoprolol tartrate 25 MG tablet Commonly known as:   LOPRESSOR Take 1 tablet (25 mg total) by mouth 2 (two) times daily.   mirtazapine 15 MG tablet Commonly known as:  REMERON Take 1 tablet (15 mg total) by mouth at bedtime.   nitroGLYCERIN 0.4 MG SL tablet Commonly known as:  NITROSTAT Place 1 tablet (0.4 mg total) under the tongue every 5 (five) minutes as needed for chest pain.   NON FORMULARY Place 3 L into the nose as needed (oxygen).   omeprazole 40 MG capsule Commonly known as:  PRILOSEC Take 1 capsule (40 mg total) by mouth daily.   potassium chloride SA 20 MEQ tablet Commonly known as:  K-DUR,KLOR-CON Take 2 tablets (40 mEq total) by mouth 2 (two) times daily.   predniSONE 10 MG tablet Commonly known as:  DELTASONE Please take 40 mg oral daily for 3 days, then 30 mg oral daily for 3 days, then 20 mg oral daily for 3 days, then 10 mg oral daily for 3 days, then stop.   RESOURCE THICKENUP CLEAR Powd Use with liquids   torsemide 20 MG tablet Commonly known as:  DEMADEX Take 3 tablets (60 mg total) by mouth 2 (two) times daily.  What changed:  See the new instructions.   VITAMIN C PO Take 1 tablet by mouth daily.   VITAMIN D (CHOLECALCIFEROL) PO Take 1,000 mg by mouth daily.   XGEVA 120 MG/1.7ML Soln injection Generic drug:  denosumab Inject 120 mg into the skin once. Monthly at [place] and Activity recommendation: See Discharge Instructions above   Consults obtained -  PCCM Oncology Dr Burr Medico Palliative care Radiation oncology   Major procedures and Radiology Reports - PLEASE review detailed and final reports for all details, in brief -   Intubation   Dg Chest 2 View  Result Date: 04/26/2016 CLINICAL DATA:  Respiratory failure.  Shortness of breath . EXAM: CHEST  2 VIEW COMPARISON:  04/23/2016. FINDINGS: Cardiomegaly with pulmonary vascular prominence and bilateral interstitial prominence and bilateral pleural effusions consistent with congestive heart failure is again noted. Findings have  improved from prior exam. No pneumothorax. Old left rib fractures. IMPRESSION: Congestive heart failure bilateral from interstitial edema and bilateral pleural effusions, findings have improved from prior exam . Electronically Signed   By: Marcello Moores  Register   On: 04/26/2016 09:31   Ct Chest Wo Contrast  Result Date: 04/09/2016 CLINICAL DATA:  Right breast cancer with bone and lung metastasis. Ongoing oral chemotherapy. COPD. Shortness of breath. EXAM: CT CHEST WITHOUT CONTRAST TECHNIQUE: Multidetector CT imaging of the chest was performed following the standard protocol without IV contrast. COMPARISON:  PET of 02/27/2016 FINDINGS: Cardiovascular: Aortic and branch vessel atherosclerosis. Tortuous thoracic aorta. Moderate cardiomegaly with coronary artery atherosclerosis. Pulmonary artery enlargement, including 3.2 cm outflow tract. Mediastinum/Nodes: 8 mm subcarinal node on image 59/ series 2. This is not pathologic by size criteria, but enlarged since the prior. Hilar regions poorly evaluated secondary to lack of contrast and motion. Lungs/Pleura: No pleural fluid. Mild to moderate motion degradation throughout. Scattered sub cm pulmonary nodules are suboptimally evaluated secondary to motion, grossly similar to on the prior. Index right upper lobe pulmonary nodule measures 8 x 10 mm on image 58/series 7 versus 8 x 6 mm on the prior exam (when remeasured). Right base airspace disease is likely atelectasis. Upper Abdomen: Moderate hepatic steatosis. Probable hepatomegaly, incompletely imaged. An area of relative hyper attenuation in the lateral segment left liver lobe measures 1.5 cm on image 137/series 2. Similar to on the prior PET, and not hypermetabolic. Normal imaged portions of the spleen, stomach, pancreas, adrenal glands. Musculoskeletal: Widespread osseous metastasis. Example lesion within the left lamina of T10 measures 11 mm on image 100/series 2 and is not significantly changed. Right-sided sternal  lesion measures on the order of 3.3 cm versus similar on the prior exam (when remeasured). Pathologic fractures involving bilateral ribs. No vertebral body height loss. IMPRESSION: 1. Moderately motion degraded exam. 2. Grossly similar large volume osseous metastasis. 3. Grossly similar bilateral pulmonary nodules since 03/25/2016. Extremely poorly evaluated. 4. New subcarinal node is not pathologic by size criteria but warrants followup attention. 5. Cardiomegaly. Coronary artery atherosclerosis. Aortic atherosclerosis. 6. Pulmonary artery enlargement suggests pulmonary arterial hypertension. 7. Hepatic steatosis. A 1.5 cm relatively hyper attenuating left liver lobe "Lesion" is similar to on the prior exam and may represent an area of fat sparing (given lack of hypermetabolism). This also warrants followup attention. Electronically Signed   By: Abigail Miyamoto M.D.   On: 04/09/2016 15:41   Dg Chest Port 1 View  Result Date: 04/23/2016 CLINICAL DATA:  Pneumonia. EXAM: PORTABLE CHEST 1 VIEW COMPARISON:  04/22/2016. FINDINGS: Cardiomegaly  with progressive bilateral pulmonary infiltrates and small pleural effusions. Findings suggest congestive heart failure. Bilateral pneumonia cannot be excluded. No pneumothorax. Old left posterior seventh rib fracture. IMPRESSION: Cardiomegaly with progressive bilateral pulmonary infiltrates and small pleural effusions. Findings suggest congestive heart failure. Bilateral pneumonia cannot be excluded. Electronically Signed   By: Marcello Moores  Register   On: 04/23/2016 06:49   Dg Chest Port 1 View  Result Date: 04/22/2016 CLINICAL DATA:  Pneumonia.  History of breast cancer. EXAM: PORTABLE CHEST 1 VIEW COMPARISON:  04/20/2016 FINDINGS: Cardiomegaly with vascular congestion. Diffuse bilateral interstitial and alveolar opacities, right greater than left, not significantly changed. Suspect small effusions. Interval extubation and removal of NG tube. IMPRESSION: Diffuse bilateral  interstitial and alveolar opacities, right greater than left, not significantly changed. This could represent asymmetric edema or infection. Electronically Signed   By: Rolm Baptise M.D.   On: 04/22/2016 07:11   Dg Chest Port 1 View  Result Date: 04/20/2016 CLINICAL DATA:  Respiratory failure. EXAM: PORTABLE CHEST 1 VIEW COMPARISON:  04/19/2016.  Chest CT 04/09/2016.   PET-CT 02/27/2016. FINDINGS: Endotracheal tube and NG tube in stable position. Stable cardiomegaly. Right hilar fullness. Adenopathy cannot be excluded. Progressive right lower lobe infiltrate and/or atelectasis. Bilateral pulmonary nodules, these are best identified by prior CT. Small right pleural effusion. No pneumothorax. IMPRESSION: 1.  Endotracheal tube and NG tube in stable position. 2. Progressive right lower lobe atelectasis and or infiltrate. Small right pleural effusion. 3. Pulmonary nodules again noted. These are best identified by prior CT. Mild right hilar fullness. This may be vascular. Right hilar adenopathy cannot be excluded. 4. Stable cardiomegaly. Electronically Signed   By: Marcello Moores  Register   On: 04/20/2016 06:52   Dg Chest Portable 1 View  Result Date: 04/19/2016 CLINICAL DATA:  Status post intubation EXAM: PORTABLE CHEST 1 VIEW COMPARISON:  Chest radiograph dated 04/19/2016 at 1518 hour. CT chest dated age [age]. FINDINGS: Endotracheal tube terminates 3 cm above the carina. Increased interstitial markings, favoring mild interstitial edema. Mild patchy right lower lobe opacity, atelectasis versus pneumonia. Possible small right pleural effusion. No pneumothorax. Known pulmonary nodules/metastases are not radiographically evident. Cardiomegaly. Enteric tube courses into the stomach. IMPRESSION: Endotracheal tube terminates 3 cm above the carina. Suspected mild interstitial edema with possible small right pleural effusion. Mild patchy right lower lobe opacity, atelectasis versus pneumonia. Electronically Signed   By:  Julian Hy M.D.   On: 04/19/2016 19:46   Dg Chest Port 1 View  Result Date: 04/19/2016 CLINICAL DATA:  Short of breath. History of metastatic breast cancer and COPD EXAM: PORTABLE CHEST 1 VIEW COMPARISON:  02/01/2016.  Chest CT 04/09/2016 FINDINGS: Cardiac enlargement. Interval development of diffuse bilateral airspace disease since the prior chest x-ray. Lungs were also clear on the recent chest CT. Findings suggest congestive heart failure with mild edema. Right lower lobe airspace disease likely represents atelectasis but could represent pneumonia. No pleural effusion. No known metastatic disease to bone. IMPRESSION: Interval development of bilateral airspace disease, most likely congestive heart failure with edema Right lower lobe atelectasis/ pneumonia Electronically Signed   By: Franchot Gallo M.D.   On: 04/19/2016 16:55   Dg Swallowing Func-speech Pathology  Result Date: 04/22/2016 Objective Swallowing Evaluation: Type of Study: MBS-Modified Barium Swallow Study Patient Details Name: MAEBY VANKLEECK MRN: [id number] Date of Birth: 05-08-1946 Today's Date: 04/22/2016 Time: SLP Start Time (ACUTE ONLY): 1340-SLP Stop Time (ACUTE ONLY): 1415 SLP Time Calculation (min) (ACUTE ONLY): 35 min Past Medical History: Past Medical History:  Diagnosis Date . Anemia   a. Adm 09/2012 with melena, Hgb 5.8 -> transfused. EGD/colonoscopy unrevealing. . Anemia of chronic disease  . Breast cancer ([place])   a. Mets to bone. ER 100%/ PR 0%/Her2 neu negative. [name] CAD (coronary artery disease)   a. 08/2012 NSTEMI: Med Rx; b. 10/2012 MV: EF 49%, old inf infarct;  c. 02/2013 NSTEMI-> Med Rx; d. 06/2013 Cath: LM nl, LAD 40p, 80-85p/m, D1 nl, LCX 20p, OM1 nl, RCA 100d w/ L->R collats, EF 30-35%, basal to mid inf AK, ant HK. . Chronic diastolic CHF (congestive heart failure) ([place])   a. 01/2014 Echo: EF 55%, triv PR. [name] Chronic respiratory failure ([place])   a. On O2 qhs and w/ exertion. 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 CKD (chronic kidney disease), stage IV ([place])  . COPD (chronic obstructive pulmonary disease) ([place])   a. Uses Home O2 w/ exertion. . Dementia   very mild . Depression  . Diabetes mellitus without complication ([place])  . Fibroid  . History of tobacco abuse   a. Quit 2014. [name] Hyperlipidemia 02/11/2013 . Lesion of vocal cord   a. CT 05/2013 concerning for tumor. . Morbid obesity ([place])  . Radiation 02/02/14-02/24/14  left and right femur 30 gray . Ulcers of both lower legs [facility])  Past Surgical History: Past Surgical History: Procedure Laterality Date . COLONOSCOPY, ESOPHAGOGASTRODUODENOSCOPY (EGD) AND ESOPHAGEAL DILATION N/A 10/13/2012  Procedure: colonoscopy and egd;  Surgeon: Wonda Horner, MD;  Location: [place];  Service: Endoscopy;  Laterality: N/A; . LEFT HEART CATHETERIZATION WITH CORONARY ANGIOGRAM N/A 07/07/2013  Procedure: LEFT HEART CATHETERIZATION WITH CORONARY ANGIOGRAM;  Surgeon: Peter M Martinique, MD;  Location: [facility] CATH LAB;  Service: Cardiovascular;  Laterality: N/A; HPI: 70 yo female adm to [facility] with respiratory deficits - requiring intubation.  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    Pt extubated within one day of admit.     Subjective: pt awake Assessment / Plan / Recommendation CHL IP CLINICAL IMPRESSIONS 04/22/2016 Therapy Diagnosis Severe pharyngeal phase dysphagia;Moderate pharyngeal phase dysphagia Clinical Impression Pt demonstrates a moderate impairment due to delay in swallow trigger. Pt consistently aspirated thin liquids before the swallow regardless of bolus size or chin tuck.  Sensation was slightly delayed, as liquids hit the mid-upper trachea. Penetration events were not sensed. Suspect sensory impairment in the base of tongue and laryngeal vestibule and glottis impacting function, as well as compromised respiration; pt was quite fatigued after efforts to transfer to the chair  during the test. Recommend dys 3 (mechanical soft) solids with honey thick liquids, though expect pt could tolerate nectar thick liquids with small sips when respiratory function improves. Will follow for tolerance and progress.  Impact on safety and function Moderate aspiration risk   CHL IP TREATMENT RECOMMENDATION 04/22/2016 Treatment Recommendations Therapy as outlined in treatment plan below   Prognosis 04/22/2016 Prognosis for Safe Diet Advancement Good Barriers to Reach Goals -- Barriers/Prognosis Comment -- CHL IP DIET RECOMMENDATION 04/22/2016 SLP Diet Recommendations Dysphagia 3 (Mech soft) solids;Honey thick liquids Liquid Administration via Cup;Spoon Medication Administration Whole meds with puree Compensations Small sips/bites;Slow rate Postural Changes Seated upright at 90 degrees   CHL IP OTHER RECOMMENDATIONS 04/22/2016 Recommended Consults -- Oral Care Recommendations Oral care BID Other Recommendations Order thickener from pharmacy   CHL IP FOLLOW UP RECOMMENDATIONS 04/22/2016 Follow up Recommendations  Skilled Nursing facility   CHL IP FREQUENCY AND DURATION 04/22/2016 Speech Therapy Frequency (ACUTE ONLY) min 2x/week Treatment Duration 2 weeks      CHL IP ORAL PHASE 04/22/2016 Oral Phase Impaired Oral - Pudding Teaspoon -- Oral - Pudding Cup -- Oral - Honey Teaspoon -- Oral - Honey Cup -- Oral - Nectar Teaspoon -- Oral - Nectar Cup -- Oral - Nectar Straw -- Oral - Thin Teaspoon -- Oral - Thin Cup -- Oral - Thin Straw -- Oral - Puree -- Oral - Mech Soft -- Oral - Regular -- Oral - Multi-Consistency -- Oral - Pill -- Oral Phase - Comment mild impairment due to missing dentition, thrusting movement with solids  CHL IP PHARYNGEAL PHASE 04/22/2016 Pharyngeal Phase Impaired Pharyngeal- Pudding Teaspoon -- Pharyngeal -- Pharyngeal- Pudding Cup -- Pharyngeal -- Pharyngeal- Honey Teaspoon -- Pharyngeal -- Pharyngeal- Honey Cup Delayed swallow initiation-vallecula Pharyngeal -- Pharyngeal- Nectar Teaspoon --  Pharyngeal -- Pharyngeal- Nectar Cup Delayed swallow initiation-vallecula;Delayed swallow initiation-pyriform sinuses;Penetration/Aspiration before swallow;Trace aspiration Pharyngeal Material enters airway, passes BELOW cords without attempt by patient to eject out (silent aspiration);Material does not enter airway Pharyngeal- Nectar Straw -- Pharyngeal -- Pharyngeal- Thin Teaspoon Delayed swallow initiation-pyriform sinuses;Penetration/Aspiration before swallow Pharyngeal Material enters airway, passes BELOW cords and not ejected out despite cough attempt by patient Pharyngeal- Thin Cup Delayed swallow initiation-pyriform sinuses;Penetration/Aspiration before swallow;Moderate aspiration Pharyngeal Material enters airway, passes BELOW cords and not ejected out despite cough attempt by patient Pharyngeal- Thin Straw Delayed swallow initiation-pyriform sinuses;Significant aspiration (Amount);Penetration/Aspiration before swallow;Compensatory strategies attempted (with notebox) Pharyngeal Material enters airway, passes BELOW cords and not ejected out despite cough attempt by patient Pharyngeal- Puree Delayed swallow initiation-vallecula Pharyngeal -- Pharyngeal- Mechanical Soft Delayed swallow initiation-vallecula Pharyngeal -- Pharyngeal- Regular -- Pharyngeal -- Pharyngeal- Multi-consistency -- Pharyngeal -- Pharyngeal- Pill Delayed swallow initiation-vallecula Pharyngeal -- Pharyngeal Comment --  No flowsheet data found. No flowsheet data found. Herbie [place], [place] [id number] [place], Katherene Ponto 04/22/2016, 2:37 PM               Micro Results    Recent Results (from the past 240 hour(s))  Blood Culture (routine x 2)     Status: None   Collection Time: 04/19/16  4:18 PM  Result Value Ref Range Status   Specimen Description BLOOD RIGHT ARM  Final   Special Requests BOTTLES DRAWN AEROBIC ONLY 5CC  Final   Culture   Final    NO GROWTH 5 DAYS Performed at [facility]    Report Status  04/24/2016 FINAL  Final  Blood Culture (routine x 2)     Status: None   Collection Time: 04/19/16  4:50 PM  Result Value Ref Range Status   Specimen Description BLOOD RIGHT ARM  Final   Special Requests IN PEDIATRIC BOTTLE [place]  Final   Culture   Final    NO GROWTH 5 DAYS Performed at [facility]    Report Status 04/24/2016 FINAL  Final  MRSA PCR Screening     Status: Abnormal   Collection Time: 04/19/16 10:41 PM  Result Value Ref Range Status   MRSA by PCR POSITIVE (A) NEGATIVE Final    Comment:        The GeneXpert MRSA Assay (FDA approved for NASAL specimens only), is one component of a comprehensive MRSA colonization surveillance program. It is not intended to diagnose MRSA infection nor to guide or monitor treatment for MRSA infections. RESULT CALLED TO, READ BACK BY AND VERIFIED WITH: C.DENNY,RN [id number]  04/20/16 [place]   Urine culture     Status: None   Collection Time: 04/20/16 12:32 AM  Result Value Ref Range Status   Specimen Description URINE, RANDOM  Final   Special Requests NONE  Final   Culture NO GROWTH Performed at [facility]   Final   Report Status 04/21/2016 FINAL  Final       Today   Subjective:   Khole Arterburn today has no headache,no chest or abdominal pain, feels much better wants to go home today. Dyspnea baseline, no cough.  Objective:   Blood pressure (!) 123/52, pulse 94, temperature 98.3 F (36.8 C), temperature source Oral, resp. rate 20, height '5\' 2"'$  (1.575 m), weight 94.8 kg (208 lb 15.9 oz), SpO2 97 %.   Intake/Output Summary (Last 24 hours) at 04/28/16 1135 Last data filed at 04/28/16 1107  Gross per 24 hour  Intake             1920 ml  Output             2300 ml  Net             -380 ml    Exam Gen: Elderly obese female Sitting in a chair ,not in distress HEENT: no pallor, moist mucosa, supple neck Chest: Fine basilar crackles, but good air entry bilaterally CVS: N S1&S2, no murmurs, rubs or gallop GI: soft,  NT, ND, BS+ Musculoskeletal: warm, mild pitting edema CNS: Alert and oriented Data Review   CBC w Diff: Lab Results  Component Value Date   WBC 11.4 (H) 04/28/2016   HGB 9.5 (L) 04/28/2016   HGB 6.8 (LL) 04/09/2016   HCT 31.4 (L) 04/28/2016   HCT 21.3 (L) 04/09/2016   PLT 255 04/28/2016   PLT 288 04/09/2016   LYMPHOPCT 6 04/19/2016   LYMPHOPCT 5.8 (L) 04/09/2016   MONOPCT 8 04/19/2016   MONOPCT 8.5 04/09/2016   EOSPCT 6 04/19/2016   EOSPCT 5.9 04/09/2016   BASOPCT 0 04/19/2016   BASOPCT 1.3 04/09/2016    CMP: Lab Results  Component Value Date   NA 142 04/28/2016   NA 141 04/09/2016   K 3.4 (L) 04/28/2016   K 3.7 04/09/2016   CL 94 (L) 04/28/2016   CL 101 02/11/2013   CO2 34 (H) 04/28/2016   CO2 22 04/09/2016   BUN 47 (H) 04/28/2016   BUN 24.9 04/09/2016   CREATININE 1.78 (H) 04/28/2016   CREATININE 1.5 (H) 04/09/2016   PROT 5.8 (L) 04/19/2016   PROT 5.8 (L) 04/09/2016   ALBUMIN 2.8 (L) 04/19/2016   ALBUMIN 2.4 (L) 04/09/2016   BILITOT 0.7 04/19/2016   BILITOT 0.44 04/09/2016   ALKPHOS 133 (H) 04/19/2016   ALKPHOS 161 (H) 04/09/2016   AST 42 (H) 04/19/2016   AST 47 (H) 04/09/2016   ALT 32 04/19/2016   ALT 22 04/09/2016  .   Total Time in preparing paper work, data evaluation and todays exam - 35 minutes  Tanicia Wolaver M.D on 04/28/2016 at 11:35 AM  [facility]   Office  [phone]

## 2016-04-28 NOTE — Discharge Instructions (Signed)
Follow with Primary MD [name], DO in 7 days   Get CBC, CMP, 2 view Chest X ray checked  by Primary MD next visit.    Activity: As tolerated with Full fall precautions use walker/cane & assistance as needed   Disposition Home    Diet: Heart Healthy , carbohydrate modified, dysphagia 3 with nectar thick, with fluid restriction 1500 mL per day , with feeding assistance and aspiration precautions.  For Heart failure patients - Check your Weight same time everyday, if you gain over 2 pounds, or you develop in leg swelling, experience more shortness of breath or chest pain, call your Primary MD immediately. Follow Cardiac Low Salt Diet and 1.5 lit/day fluid restriction.   On your next visit with your primary care physician please Get Medicines reviewed and adjusted.   Please request your Prim.MD to go over all Hospital Tests and Procedure/Radiological results at the follow up, please get all Hospital records sent to your Prim MD by signing hospital release before you go home.   If you experience worsening of your admission symptoms, develop shortness of breath, life threatening emergency, suicidal or homicidal thoughts you must seek medical attention immediately by calling 911 or calling your MD immediately  if symptoms less severe.  You Must read complete instructions/literature along with all the possible adverse reactions/side effects for all the Medicines you take and that have been prescribed to you. Take any new Medicines after you have completely understood and accpet all the possible adverse reactions/side effects.   Do not drive, operating heavy machinery, perform activities at heights, swimming or participation in water activities or provide baby sitting services if your were admitted for syncope or siezures until you have seen by Primary MD or a Neurologist and advised to do so again.  Do not drive when taking Pain medications.    Do not take more than prescribed  Pain, Sleep and Anxiety Medications  Special Instructions: If you have smoked or chewed Tobacco  in the last 2 yrs please stop smoking, stop any regular Alcohol  and or any Recreational drug use.  Wear Seat belts while driving.   Please note  You were cared for by a hospitalist during your hospital stay. If you have any questions about your discharge medications or the care you received while you were in the hospital after you are discharged, you can call the unit and asked to speak with the hospitalist on call if the hospitalist that took care of you is not available. Once you are discharged, your primary care physician will handle any further medical issues. Please note that NO REFILLS for any discharge medications will be authorized once you are discharged, as it is imperative that you return to your primary care physician (or establish a relationship with a primary care physician if you do not have one) for your aftercare needs so that they can reassess your need for medications and monitor your lab values.

## 2016-04-29 ENCOUNTER — Telehealth: Payer: Self-pay | Admitting: Student

## 2016-04-29 NOTE — Telephone Encounter (Signed)
Patient called to verify her torsemide dose. She is under the impression that she was discharged on 20 mg of torsemide. She says she used to take 4 tablets in the morning and two in the evening. Her discharge summary shows 3 tablets of 20 mg twice a day. I explained to her this, and she voiced understanding.

## 2016-04-30 LAB — GLUCOSE, CAPILLARY
GLUCOSE-CAPILLARY: 170 mg/dL — AB (ref 65–99)
Glucose-Capillary: 207 mg/dL — ABNORMAL HIGH (ref 65–99)

## 2016-05-01 ENCOUNTER — Encounter: Payer: Self-pay | Admitting: Radiation Oncology

## 2016-05-01 ENCOUNTER — Ambulatory Visit
Admission: RE | Admit: 2016-05-01 | Discharge: 2016-05-01 | Disposition: A | Discharge status: Home / Self Care | Payer: Commercial Managed Care - HMO | Source: Ambulatory Visit | Attending: Radiation Oncology | Admitting: Radiation Oncology

## 2016-05-01 ENCOUNTER — Ambulatory Visit: Payer: Commercial Managed Care - HMO

## 2016-05-01 VITALS — BP 115/71 | HR 87 | Temp 98.0°F | Ht 62.0 in | Wt 210.7 lb

## 2016-05-01 DIAGNOSIS — C50919 Malignant neoplasm of unspecified site of unspecified female breast: Secondary | ICD-10-CM

## 2016-05-01 DIAGNOSIS — C7951 Secondary malignant neoplasm of bone: Secondary | ICD-10-CM | POA: Diagnosis present

## 2016-05-01 NOTE — Progress Notes (Signed)
  Radiation Oncology         [phone] ________________________________  Name: Anita [name] MRN: [id number]  Date: 05/01/2016  DOB: September 05, 1945  Weekly Radiation Therapy Management    ICD-9-CM ICD-10-CM   1. Breast cancer metastasized to bone, unspecified laterality (HCC) 174.9 C50.919    198.5 C79.51     Current Dose: 6 Gy     Planned Dose:  30 Gy  Narrative . . . . . . . . The patient presents for routine under treatment assessment.                          Anita [name] has completed 2 fractions to her left proximal tibia.  She denies having pain today, but did have pain in her left knee over the weekend.  She reports she is using a walker at home.  She denies having any fatigue.  She is receiving physical therapy at home.                                 Set-up films were reviewed.                                 The chart was checked. Physical Findings. . .  height is 5\' 2"  (1.575 m) and weight is 210 lb 11.2 oz (95.6 kg). Her oral temperature is 98 F (36.7 C). Her blood pressure is 115/71 and her pulse is 87. Her oxygen saturation is 99%.   The patient presents in a wheelchair. Nasal cannula in place with oxygen. No radiation reaction in the treatment area at this time. Lungs are clear to auscultation bilaterally. Heart has regular rate and rhythm. Impression . . . . . . . The patient is tolerating radiation. Plan . . . . . . . . . . . . Continue treatment as planned. ________________________________   Blair Promise, PhD, MD

## 2016-05-01 NOTE — Progress Notes (Signed)
Ruthene Woodhull has completed 2 fractions to her left proximal tibia.  She denies having pain today but did have pain in her left knee over the weekend.  She reports she is using a walker at home.  She denies having any fatigue.  She is receiving physical therapy at home.  Reviewed side effects including skin irritation and fatigue.  BP 115/71 (BP Location: Left Wrist, Patient Position: Sitting)   Pulse 87   Temp 98 F (36.7 C) (Oral)   Ht 5\' 2"  (1.575 m)   Wt 210 lb 11.2 oz (95.6 kg)   SpO2 99% Comment: 4 L  BMI 38.54 kg/m   Wt Readings from Last 3 Encounters:  05/01/16 210 lb 11.2 oz (95.6 kg)  04/28/16 208 lb 15.9 oz (94.8 kg)  04/05/16 220 lb (99.8 kg)

## 2016-05-02 ENCOUNTER — Ambulatory Visit: Payer: Commercial Managed Care - HMO

## 2016-05-02 ENCOUNTER — Telehealth: Payer: Self-pay | Admitting: Internal Medicine

## 2016-05-02 ENCOUNTER — Ambulatory Visit
Admission: RE | Admit: 2016-05-02 | Discharge: 2016-05-02 | Disposition: A | Discharge status: Home / Self Care | Payer: Commercial Managed Care - HMO | Source: Ambulatory Visit | Attending: Radiation Oncology | Admitting: Radiation Oncology

## 2016-05-02 ENCOUNTER — Telehealth: Payer: Self-pay | Admitting: *Deleted

## 2016-05-02 DIAGNOSIS — C50919 Malignant neoplasm of unspecified site of unspecified female breast: Secondary | ICD-10-CM | POA: Diagnosis not present

## 2016-05-02 LAB — GLUCOSE, CAPILLARY: Glucose-Capillary: 321 mg/dL — ABNORMAL HIGH (ref 65–99)

## 2016-05-02 MED ORDER — ALCOHOL SWABS PADS: 1.0000 "application " | MEDICATED_PAD | 11 refills | Status: AC | PRN

## 2016-05-02 NOTE — Telephone Encounter (Signed)
Please send Rx for alcohol swabs to [facility].  Derl Barrow, RN

## 2016-05-02 NOTE — Telephone Encounter (Signed)
Patient called stating her blood sugar around 1 PM was 325.  She was eating tuna and crackers.  Patient reported she only takes Lantus Solostar at night.  Yesterday morning blood sugars was 122, lunch time 296 and evening 309.  Patient denies headache, n/v, dizziness.  Patient stated she is very thirsty, but trying to keep herself hydrated.  Advised patient that she should be seen for her increase blood glucose levels.  Offered an appointment for tomorrow, but declined stating she has an appointment on 05/31/16.  Please give patient a call.  Derl Barrow, RN

## 2016-05-02 NOTE — Telephone Encounter (Signed)
Called patient to discuss her CBGs. Patient reports that she tried to take her fasting CBG this morning but was unable to and that her CBG after eating a sausage biscuit this morning was 309. Fasting yesterday was 122 and postprandial values were 296 and 309.  Patient very concerned about her CBGs. Discussed with her that she is on prednisone and that is likely the reason her CBGs are elevated.   Discussed with patient that I would strongly encourage her to come in for OV prior to scheduled 9/20 especially given there were multiple follow up recommendations from discharging provider at [facility]. Patient stated she would be unable to come in earlier given daily radiation therapy. She is seeing cardiology on 9/8.   Patient reports that she has been compliant with Lantus. Given persistently elevated postprandial CBGs, told patient she could increase Lantus to from 8 units to 10 units. Instructed patient to take a fasting CBG the morning after this increase. Reviewed hypoglycemia symptoms and management with patient. She voiced understanding.   Phill Myron, D.O. 05/02/2016, 4:54 PM PGY-2, [place]

## 2016-05-02 NOTE — Telephone Encounter (Signed)
Sent Rx alcohol swabs per patient's request to [facility].   Phill Myron, D.O. 05/02/2016, 1:58 PM PGY-2, [place]

## 2016-05-02 NOTE — Telephone Encounter (Signed)
Pt would like a refill on alcohol swabs called into her mail order pharmacy. Please advise. Thanks! ep

## 2016-05-03 ENCOUNTER — Telehealth: Payer: Self-pay | Admitting: Internal Medicine

## 2016-05-03 ENCOUNTER — Ambulatory Visit: Payer: Commercial Managed Care - HMO

## 2016-05-03 ENCOUNTER — Ambulatory Visit
Admission: RE | Admit: 2016-05-03 | Discharge: 2016-05-03 | Disposition: A | Discharge status: Home / Self Care | Payer: Commercial Managed Care - HMO | Source: Ambulatory Visit | Attending: Radiation Oncology | Admitting: Radiation Oncology

## 2016-05-03 DIAGNOSIS — C50919 Malignant neoplasm of unspecified site of unspecified female breast: Secondary | ICD-10-CM | POA: Diagnosis not present

## 2016-05-03 LAB — GLUCOSE, CAPILLARY: Glucose-Capillary: 264 mg/dL — ABNORMAL HIGH (ref 65–99)

## 2016-05-03 NOTE — Telephone Encounter (Signed)
OT with [facility]:  Pt declined OT evaluation for today. He is requesting verbal orders to do OT eval next week per pt request.

## 2016-05-03 NOTE — Progress Notes (Signed)
HPI  The patient presents for follow up after a hospitalization for treatment of dyspnea.    She was discharged on 9/2.  I reviewed the hospital records.  She was admitted with respiratory failure with pneumonia and acute on chronic COPD.    She was also discharged with increased diuresis.  She was discharged on Torsemide 60 mg bid and Zaroxolyn 3 x per week.   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  She was seen by palliative care.   Since going home she says she is breathing much better although she does continue chronically ill. She is continuing the radiation. She's not having any new PND or orthopnea. The swelling in her legs is there but she says it's down from previous. She does have a cough but she is not bringing anything up with this. She denies any chest pressure, neck or arm discomfort..   Allergies  Allergen Reactions  . Omnipaque [Iohexol] Hives and Other (See Comments)    Pt claims she developed hives after given contrast  . Benzene Rash    Current Outpatient Prescriptions  Medication Sig Dispense Refill  . acetaminophen (TYLENOL) 325 MG tablet Take 2 tablets (650 mg total) by mouth every 4 (four) hours as needed for headache or mild pain. 30 tablet 0  . albuterol (PROVENTIL HFA;VENTOLIN HFA) 108 (90 BASE) MCG/ACT inhaler Inhale 2 puffs into the lungs every 6 (six) hours as needed for wheezing or shortness of breath. 3 Inhaler 3  . albuterol (PROVENTIL) (2.5 MG/3ML) 0.083% nebulizer solution Inhale 3 mLs into the lungs every 6 (six) hours as needed for shortness of breath or wheezing.    . Alcohol Swabs PADS 1 application by Does not apply route as needed. 100 each 11  . Ascorbic Acid (VITAMIN C PO) Take 1 tablet by mouth daily.    [name] aspirin EC 81 MG EC tablet Take 1 tablet (81 mg total) by mouth daily. 30 tablet 3  . atorvastatin (LIPITOR) 40 MG tablet TAKE 1 TABLET EVERY DAY 90 tablet 3  . bag balm OINT ointment Apply 1 g topically as needed for dry  skin or irritation. 300 g 11  . Cetirizine HCl 10 MG CAPS Take 1 capsule (10 mg total) by mouth daily. 30 capsule 5  . clopidogrel (PLAVIX) 75 MG tablet Take 1 tablet (75 mg total) by mouth daily. 30 tablet 3  . denosumab (XGEVA) 120 MG/1.7ML SOLN injection Inject 120 mg into the skin once. Monthly at [facility]    . fluticasone (FLONASE) 50 MCG/ACT nasal spray Place 2 sprays into both nostrils daily. 16 g 6  . fulvestrant (FASLODEX) 250 MG/5ML injection Inject 250 mg into the muscle every 30 (thirty) days. Cancer center    . HYDROcodone-acetaminophen (NORCO/VICODIN) 5-325 MG tablet Take 1 tablet by mouth every 6 (six) hours as needed for moderate pain. 20 tablet 0  . hydrocortisone 2.5 % ointment Apply topically 2 (two) times daily. 90 g 3  . HYDROmorphone (DILAUDID) 2 MG tablet Take 1 tablet (2 mg total) by mouth every 4 (four) hours as needed for severe pain. 60 tablet 0  . Insulin Glargine (LANTUS) 100 UNIT/ML Solostar Pen Inject 8 Units into the skin daily at 10 pm. 15 mL 1  . Insulin Pen Needle 29G X 10MM MISC Use with insulin pen 100 each 0  . isosorbide mononitrate (IMDUR) 60 MG 24 hr tablet Take 1 tablet (60 mg total) by mouth daily. 90 tablet 3  .  Maltodextrin-Xanthan Gum (RESOURCE THICKENUP CLEAR) POWD Use with liquids 1 Can 2  . metolazone (ZAROXOLYN) 2.5 MG tablet Please take 2.5 mg oral every Monday, Wednesday and Friday. 30 tablet 0  . metoprolol tartrate (LOPRESSOR) 25 MG tablet Take 1 tablet (25 mg total) by mouth 2 (two) times daily. 60 tablet 3  . mirtazapine (REMERON) 15 MG tablet Take 1 tablet (15 mg total) by mouth at bedtime. 30 tablet 5  . nitroGLYCERIN (NITROSTAT) 0.4 MG SL tablet Place 1 tablet (0.4 mg total) under the tongue every 5 (five) minutes as needed for chest pain. 30 tablet 0  . NON FORMULARY Place 3 L into the nose as needed (oxygen).     [name] omeprazole (PRILOSEC) 40 MG capsule Take 1 capsule (40 mg total) by mouth daily. 30 capsule 3  . potassium chloride SA  (K-DUR,KLOR-CON) 20 MEQ tablet Take 2 tablets (40 mEq total) by mouth 2 (two) times daily. 360 tablet 6  . predniSONE (DELTASONE) 10 MG tablet Please take 40 mg oral daily for 3 days, then 30 mg oral daily for 3 days, then 20 mg oral daily for 3 days, then 10 mg oral daily for 3 days, then stop. 30 tablet 0  . torsemide (DEMADEX) 20 MG tablet Take 3 tablets (60 mg total) by mouth 2 (two) times daily. 360 tablet 0  . VITAMIN D, CHOLECALCIFEROL, PO Take 1,000 mg by mouth daily.      No current facility-administered medications for this visit.     Past Medical History:  Diagnosis Date  . Anemia    a. Adm 09/2012 with melena, Hgb 5.8 -> transfused. EGD/colonoscopy unrevealing.  . Anemia of chronic disease   . Breast cancer ([place])    a. Mets to bone. ER 100%/ PR 0%/Her2 neu negative.  [name] CAD (coronary artery disease)    a. 08/2012 NSTEMI: Med Rx; b. 10/2012 MV: EF 49%, old inf infarct;  c. 02/2013 NSTEMI-> Med Rx; d. 06/2013 Cath: LM nl, LAD 40p, 80-85p/m, D1 nl, LCX 20p, OM1 nl, RCA 100d w/ L->R collats, EF 30-35%, basal to mid inf AK, ant HK.  . Chronic diastolic CHF (congestive heart failure) ([place])    a. 01/2014 Echo: EF 55%, triv PR.  [name] Chronic respiratory failure ([place])    a. On O2 qhs and w/ exertion.  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 CKD (chronic kidney disease), stage IV ([place])   . COPD (chronic obstructive pulmonary disease) ([place])    a. Uses Home O2 w/ exertion.  . Dementia    very mild  . Depression   . Diabetes mellitus without complication ([place])   . Fibroid   . History of tobacco abuse    a. Quit 2014.  [name] Hyperlipidemia 02/11/2013  . Lesion of vocal cord    a. CT 05/2013 concerning for tumor.  . Morbid obesity ([place])   . Radiation 02/02/14-02/24/14   left and right femur 30 gray  . Ulcers of both lower legs [facility])     Past Surgical History:  Procedure Laterality Date  . COLONOSCOPY, ESOPHAGOGASTRODUODENOSCOPY (EGD) AND ESOPHAGEAL DILATION  N/A 10/13/2012   Procedure: colonoscopy and egd;  Surgeon: Wonda Horner, MD;  Location: [place];  Service: Endoscopy;  Laterality: N/A;  . LEFT HEART CATHETERIZATION WITH CORONARY ANGIOGRAM N/A 07/07/2013   Procedure: LEFT HEART CATHETERIZATION WITH CORONARY ANGIOGRAM;  Surgeon: Peter M Martinique, MD;  Location: [facility] CATH LAB;  Service: Cardiovascular;  Laterality: N/A;    ROS:    As stated in the HPI and negative for all other systems.  PHYSICAL EXAM BP (!) 128/56   Pulse 86   Ht '5\' 2"'$  (1.575 m)   Wt 208 lb 6.4 oz (94.5 kg)   SpO2 98%   BMI 38.12 kg/m  GEN:  No distress but pale and chronically ill appearing. NECK:  No jugular venous distention at 90 degrees, waveform within normal limits, carotid upstroke brisk and symmetric, no bruits, no thyromegaly LUNGS:  Few basilar crackles and BACK:  No CVA tenderness CHEST:  Unremarkable HEART:  S1 and S2 within normal limits, no S3, no S4, no clicks, no rubs, distant heart sounds murmurs ABD:  Positive bowel sounds normal in frequency in pitch, no bruits, no rebound, no guarding, unable to assess midline mass or bruit with the patient seated. EXT:  2 plus pulses throughout, mild bilateral lower extremity edema, no cyanosis no clubbing   ASSESSMENT AND PLAN  ACUTE SYSTOLIC AND DIASTOLIC HF:  She seems to be about is euvolemic and she can be. I won't be surprised if her potassium is low and creatinine elevated I'll check this today. For now she'll continue the meds as listed but I suspect  CAD:  The patient has no new sypmtoms.  No further cardiovascular testing is indicated.  We will continue with aggressive risk reduction and meds as listed.   CKD:  As above she needs to have this followed closely and I will order labs.  Hospital records reviewed.

## 2016-05-04 ENCOUNTER — Ambulatory Visit (INDEPENDENT_AMBULATORY_CARE_PROVIDER_SITE_OTHER): Payer: Commercial Managed Care - HMO | Admitting: Cardiology

## 2016-05-04 ENCOUNTER — Ambulatory Visit: Payer: Commercial Managed Care - HMO

## 2016-05-04 ENCOUNTER — Ambulatory Visit
Admission: RE | Admit: 2016-05-04 | Discharge: 2016-05-04 | Disposition: A | Discharge status: Home / Self Care | Payer: Commercial Managed Care - HMO | Source: Ambulatory Visit | Attending: Radiation Oncology | Admitting: Radiation Oncology

## 2016-05-04 ENCOUNTER — Encounter: Payer: Self-pay | Admitting: Cardiology

## 2016-05-04 VITALS — BP 128/56 | HR 86 | Ht 62.0 in | Wt 208.4 lb

## 2016-05-04 DIAGNOSIS — Z79899 Other long term (current) drug therapy: Secondary | ICD-10-CM

## 2016-05-04 DIAGNOSIS — C50919 Malignant neoplasm of unspecified site of unspecified female breast: Secondary | ICD-10-CM | POA: Diagnosis not present

## 2016-05-04 LAB — CBC
HEMATOCRIT: 29.2 % — AB (ref 35.0–45.0)
HEMOGLOBIN: 9.4 g/dL — AB (ref 11.7–15.5)
MCH: 27.7 pg (ref 27.0–33.0)
MCHC: 32.2 g/dL (ref 32.0–36.0)
MCV: 86.1 fL (ref 80.0–100.0)
MPV: 8.4 fL (ref 7.5–12.5)
Platelets: 189 10*3/uL (ref 140–400)
RBC: 3.39 MIL/uL — ABNORMAL LOW (ref 3.80–5.10)
RDW: 19.1 % — AB (ref 11.0–15.0)
WBC: 8.5 10*3/uL (ref 3.8–10.8)

## 2016-05-04 LAB — BASIC METABOLIC PANEL
BUN: 48 mg/dL — AB (ref 7–25)
CO2: 30 mmol/L (ref 20–31)
Calcium: 10.9 mg/dL — ABNORMAL HIGH (ref 8.6–10.4)
Chloride: 103 mmol/L (ref 98–110)
Creat: 1.96 mg/dL — ABNORMAL HIGH (ref 0.50–0.99)
Glucose, Bld: 184 mg/dL — ABNORMAL HIGH (ref 65–99)
POTASSIUM: 3.3 mmol/L — AB (ref 3.5–5.3)
SODIUM: 143 mmol/L (ref 135–146)

## 2016-05-04 NOTE — Patient Instructions (Signed)
Medication Instructions:  Continue current medications  Labwork: BMP and CBC  Testing/Procedures: None Ordered  Follow-Up: Your physician recommends that you schedule a follow-up appointment in: 1 Month with APP   Any Other Special Instructions Will Be Listed Below (If Applicable).     If you need a refill on your cardiac medications before your next appointment, please call your pharmacy.

## 2016-05-04 NOTE — Telephone Encounter (Signed)
Verbal order given for OT eval   Phill Myron, D.O. 05/04/2016, 3:03 PM PGY-2, [place]

## 2016-05-05 ENCOUNTER — Telehealth: Payer: Self-pay | Admitting: Obstetrics and Gynecology

## 2016-05-05 NOTE — Telephone Encounter (Signed)
Family Medicine Emergency Line Telephone Note   Patient called line stating that her blood sugar is 538. She was recently put on steroids. Patient is concerned about her CBGs and is wondering what she can eat not increase values further. Discussed with her that elevated CBGs is likely due to steroids. Patient kept asking if she should just not eat to bring sugars down. Discussed this was not a treatment option for patient and that I think she should come into the ED to be evaluated and treated properly. Strongly encouraged patient to come in but she was fixated on what to eat to manage sugars. Patient ultimately ended up hanging up the phone during conversation. Unsure if patient will follow through and come to the hospital.   Of note unsure of patient's baseline but she seemed a little altered on phone conversation. From chart reviewing it appears her CBGs have been increasing steadily over the last couple of days. She is only on Lantus at home and was instructed on 9/6 to increase to 10U.   Luiz Blare, DO 05/05/2016, 3:41 PM PGY-3, [place]

## 2016-05-07 ENCOUNTER — Telehealth: Payer: Self-pay | Admitting: Internal Medicine

## 2016-05-07 ENCOUNTER — Ambulatory Visit: Payer: Commercial Managed Care - HMO

## 2016-05-07 ENCOUNTER — Telehealth: Payer: Self-pay | Admitting: Oncology

## 2016-05-07 ENCOUNTER — Ambulatory Visit
Admission: RE | Admit: 2016-05-07 | Discharge: 2016-05-07 | Disposition: A | Discharge status: Home / Self Care | Payer: Commercial Managed Care - HMO | Source: Ambulatory Visit | Attending: Radiation Oncology | Admitting: Radiation Oncology

## 2016-05-07 DIAGNOSIS — C50919 Malignant neoplasm of unspecified site of unspecified female breast: Secondary | ICD-10-CM | POA: Diagnosis not present

## 2016-05-07 LAB — GLUCOSE, CAPILLARY: GLUCOSE-CAPILLARY: 360 mg/dL — AB (ref 65–99)

## 2016-05-07 NOTE — Telephone Encounter (Signed)
Attempted to return patient's call regarding her CBGs.   I need to know what her fasting CBG is first thing in the morning prior to eating anything before I can determine if it is safe for her to increase Lantus.   If patient calls back please ask for further verification regarding her CBGs, what time of day those results are from, and how close to meals those results are from.   Phill Myron, D.O. 05/07/2016, 5:31 PM PGY-2, [place]

## 2016-05-07 NOTE — Progress Notes (Signed)
Patient reports having aching pain in her arms, legs and behind her knees.  She also requested that her blood sugar be taken.  It was 360 today.  She is taking steroids and lantus insulin.

## 2016-05-07 NOTE — Telephone Encounter (Signed)
Patient returned call on emergency line to discuss CBGs in setting of steroid use. Has three more days of Prednisone 20 mg and then three more days of Prednisone 10 mg. She is currently taking Lantus 10 units nightly.   Patient read me a long list of CBGs both fasting and post-prandial which were all >200 except for one fasting CBG of 92 on 9/8. Other fasting CBGs have been on average around 280-310. Denies symptoms of hypoglycemia.   Instructed patient that she could increase Lantus to 12 units daily with close monitoring of her CBGs especially as her Prednisone dose tapers. Reviewed hypoglycemia symptoms and management protocol.   Phill Myron, D.O. 05/07/2016, 10:10 PM PGY-2, [place]

## 2016-05-07 NOTE — Telephone Encounter (Signed)
Anita [name] called and said she is having increased pain in her legs, especially behind her knees that started over the weekend.  She has been taking Dilaudid 2 mg every few hours without releif.  Advised her to come in for treatment today and to see a doctor after treatment.  Anita [name] verbalized agreement.

## 2016-05-07 NOTE — Telephone Encounter (Signed)
Pt is calling and would like to increase her insulin to 12 units. She said that her blood sugars are over 500 and the amount she is currently taking is not helping. Please call her so that she knows if she can up the dosage. jw

## 2016-05-07 NOTE — Telephone Encounter (Signed)
Received another call from patient regarding the pain she is having in her legs.  Pt has already spoken to Cassandria Anger in [place] this am. Pt is coming in for XRt and to see MD after.  Reinforced with patient the need for her to come in today as scheduled. She voiced understanding.

## 2016-05-08 ENCOUNTER — Ambulatory Visit
Admission: RE | Admit: 2016-05-08 | Discharge: 2016-05-08 | Disposition: A | Discharge status: Home / Self Care | Payer: Commercial Managed Care - HMO | Source: Ambulatory Visit | Attending: Radiation Oncology | Admitting: Radiation Oncology

## 2016-05-08 ENCOUNTER — Encounter: Payer: Self-pay | Admitting: Radiation Oncology

## 2016-05-08 ENCOUNTER — Ambulatory Visit: Payer: Commercial Managed Care - HMO

## 2016-05-08 VITALS — BP 108/64 | HR 89 | Temp 97.8°F | Ht 62.0 in | Wt 202.5 lb

## 2016-05-08 DIAGNOSIS — C50919 Malignant neoplasm of unspecified site of unspecified female breast: Secondary | ICD-10-CM | POA: Diagnosis not present

## 2016-05-08 DIAGNOSIS — C7951 Secondary malignant neoplasm of bone: Principal | ICD-10-CM

## 2016-05-08 NOTE — Progress Notes (Signed)
Anita [name] has completed 7 fractions to her left tibia.  She reports having more sharp pain behind both knees that started over the weekend.  She has been taking 2 tablets of dilaudid per day.  She has ace bandages wrapped around her ankles for swelling. She continues to walk with a walker at home.  Her blood sugars have been elevated since she has been taking prednisone.  It was 226 today.  She reports having fatigue.  BP 108/64 (BP Location: Left Wrist, Patient Position: Sitting)   Pulse 89   Temp 97.8 F (36.6 C) (Oral)   Ht 5\' 2"  (1.575 m)   Wt 202 lb 8 oz (91.9 kg)   SpO2 97%   BMI 37.04 kg/m    Wt Readings from Last 3 Encounters:  05/08/16 202 lb 8 oz (91.9 kg)  05/04/16 208 lb 6.4 oz (94.5 kg)  05/01/16 210 lb 11.2 oz (95.6 kg)

## 2016-05-08 NOTE — Progress Notes (Signed)
  Radiation Oncology         [phone] ________________________________  Name: Anita [name] MRN: [id number]  Date: 05/08/2016  DOB: September 09, 1945  Weekly Radiation Therapy Management  Metastatic breast cancer with lytic osseous metastasis involving the left proximal tibia     ICD-9-CM ICD-10-CM   1. Breast cancer metastasized to bone, unspecified laterality (HCC) 174.9 C50.919    198.5 C79.51     Current Dose: 21 Gy     Planned Dose:  30 Gy  Narrative . . . . . . . . The patient presents for routine under treatment assessment.                         Anita [name] has completed 7 fractions to her left tibia.  She reports having more sharp pain behind both knees that started over the weekend.  She has been taking 2 tablets of dilaudid per day.  She has ace bandages wrapped around her ankles for swelling, that was done by her daughter who is a CNA. She continues to walk with a walker at home.  Her blood sugars have been elevated since she has been taking prednisone.  It was 226 today.  She reports having fatigue. The patient states that she is in extreme pain from both of her knees.                                   Set-up films were reviewed.                                 The chart was checked. Physical Findings. . .  height is 5\' 2"  (1.575 m) and weight is 202 lb 8 oz (91.9 kg). Her oral temperature is 97.8 F (36.6 C). Her blood pressure is 108/64 and her pulse is 89. Her oxygen saturation is 97%.   The patient presents in a wheelchair. No radiation reaction in the treatment area at this time. Lungs are clear to auscultation bilaterally. Heart has regular rate and rhythm. Fluid is present in the popliteal region, no signs of infection. No palpable cords in the thigh area.  Impression . . . . . . . The patient is tolerating radiation. Plan . . . . . . . . . . . . Continue treatment as planned. I told the patient to elevate her legs when she gets home, and to take an extra fluid pill.    ________________________________   Blair Promise, PhD, MD  This document serves as a record of services personally performed by Gery Pray, MD. It was created on his behalf by Truddie Hidden, a trained medical scribe. The creation of this record is based on the scribe's personal observations and the provider's statements to them. This document has been checked and approved by the attending provider.

## 2016-05-09 ENCOUNTER — Telehealth: Payer: Self-pay | Admitting: Oncology

## 2016-05-09 ENCOUNTER — Ambulatory Visit: Payer: Commercial Managed Care - HMO

## 2016-05-09 ENCOUNTER — Telehealth: Payer: Self-pay | Admitting: Cardiology

## 2016-05-09 ENCOUNTER — Ambulatory Visit
Admission: RE | Admit: 2016-05-09 | Discharge: 2016-05-09 | Disposition: A | Discharge status: Home / Self Care | Payer: Commercial Managed Care - HMO | Source: Ambulatory Visit | Attending: Radiation Oncology | Admitting: Radiation Oncology

## 2016-05-09 DIAGNOSIS — C50919 Malignant neoplasm of unspecified site of unspecified female breast: Secondary | ICD-10-CM | POA: Diagnosis not present

## 2016-05-09 NOTE — Telephone Encounter (Signed)
Patient call to report blood sugar was 375 this AM when she woke up and at 3 PM it was in the 500s. At 3:45 it went down to 324.  Patient did eat pasta salad, sausage and tomato today.  Pt usually  Take 12 Units of Lantus at night.  Patient also reported being very thirsty.  Patient is drinking plenty of water. Denies n/v, abd pain, headache, dizziness.  Precept with Dr. Andria Frames and Dr. Ardelia Mems; patient to continue drinking plenty of fluids and check fasting blood sugars. Pt to follow up with PCP.  Will forward to PCP.  Derl Barrow, RN

## 2016-05-09 NOTE — Telephone Encounter (Signed)
Called and spoke w/ Tammy with [facility]. She is RN who provides home care services for patient in context of palliative care. Notes she wanted to make Dr. Percival Spanish aware of some changes at home.  Pt was seen on 9/8, noted euvolemic at 208 lbs, no med changes made at appt.  Tammy notes pt weight trends have been going down for past week; -- this AM she was at 197 self-weighed-- Tammy came to house and weighed to ensure accuracy - noted pt 199 lbs w/ clothes.  She obtained BP reading of 158/82 today, pt HR of 120s on ambulation and 116 at rest. Notes pt has not been taking metoprolol consistently, due to concern that it would dip her BP too low. She was also not taking the metolazone last week, but reports compliance this week.  Tammy notes palliative care physician concerned pt may be too dry. Physician recommended, if not contraindicated by [place], to have pt hold afternoon dose of torsemide today and metolazone today (she takes metolazone Mon, Wed, and Fri only). Advised OK. Also recommended pt resume metoprolol as prescribed so we can get control of HR and consistent BP readings. Aware I will route to [facility] for any further advisements, o/w defer to Dr. Percival Spanish.

## 2016-05-09 NOTE — Telephone Encounter (Signed)
Spoke w Tammy as follow up w/ Dr. Victorino December advice, as discussed. She voiced understanding of instructions, will relay recommendation to do full metoprolol dose as prescribed and have patient call should she have issues or concerns, so that we may give her best recommendations/med management advice.

## 2016-05-09 NOTE — Telephone Encounter (Signed)
New message   Calling to report that the pt saw the doctor recently and has continued to loss weight from 209 on last Friday down to 197 this morning. Pulse rate resting 116 and suppose to be on metoprolol but she stopped taking it b/c she thinks it makes her bp low. Please call.

## 2016-05-09 NOTE — Telephone Encounter (Signed)
[facility] and spoke to Dr. Alcario Drought nurse, April.  Advised her that Anita [name]'s blood sugar was 554 today on her meter.  Advised her that Anita [name] had eaten pasta salad before she came for radiation and that she took 12 units of Lantus last night.  Also that she is currently taking a prednisone taper.  April asked that patient take a few fasting blood sugars and let them know the results.  They are not going to change her insulin dose at this time.  Tried to call Anita [name] and was not able to leave a message.

## 2016-05-09 NOTE — Telephone Encounter (Signed)
Spoke with Santiago Glad from Dr. Lois Huxley office and she wanted to report that pt blood sugar today was 554 and that she has been checking it daily for pt with pt meter between 2-3 in the afternoon.   She stated that it has been running in the 300's and that the doctor wanted to make PCP aware just in case anything needed to be done immediately.  Asked preceptors Dr. Ardelia Mems and Dr. Andria Frames and they stated that at this moment nothing urgent needed to be done. Stated that pt should try and get some fasting blood sugars and drink lots of liquids, and to let us know the results.  Pt called tamika and spoke with her about this as well and she informed pt when she called to report her numbers herself.  Forwarding to PCP. Katharina Caper, April D, [place]

## 2016-05-09 NOTE — Telephone Encounter (Addendum)
Ms. Haar called back.  Advised her that her Lantus does will stay the same for now and to take a few fasting blood sugars and notify Dr. Alcario Drought office in the morning.  Anita [name] verbalized understanding and agreement.  She said her husband checked her blood sugar when they got home and it was 324.

## 2016-05-09 NOTE — Progress Notes (Signed)
Patient's blood sugar was 554 on her meter today.  Dr. Sondra Come notified.

## 2016-05-09 NOTE — Telephone Encounter (Signed)
Please ask her to take her metoprolol as prescribed. We will never stop it altogether but may consider cutting it back if her blood pressure becomes too low. Agree with recommendation to hold both diuretics this afternoon.

## 2016-05-10 ENCOUNTER — Telehealth: Payer: Self-pay | Admitting: *Deleted

## 2016-05-10 ENCOUNTER — Ambulatory Visit
Admission: RE | Admit: 2016-05-10 | Discharge: 2016-05-10 | Disposition: A | Discharge status: Home / Self Care | Payer: Commercial Managed Care - HMO | Source: Ambulatory Visit | Attending: Radiation Oncology | Admitting: Radiation Oncology

## 2016-05-10 ENCOUNTER — Ambulatory Visit: Payer: Commercial Managed Care - HMO

## 2016-05-10 DIAGNOSIS — C50919 Malignant neoplasm of unspecified site of unspecified female breast: Secondary | ICD-10-CM | POA: Diagnosis not present

## 2016-05-10 NOTE — Progress Notes (Signed)
Patient reports she did not eat before coming to radiation.  Checked patient's blood sugar on her meter and it was 167 today.

## 2016-05-10 NOTE — Telephone Encounter (Signed)
Patient evaluated on 05-09-16, would like verbal ok for 1x a week for 1 week, zero times a week for 1 week and then 1x a week for 1 week for ADL, exercise, infection control, safety, fall prevention and health promotion.  Also need verbal ok to discharge when goals are met or at maximum potential. Jahden Schara,CMA

## 2016-05-11 ENCOUNTER — Ambulatory Visit
Admission: RE | Admit: 2016-05-11 | Discharge: 2016-05-11 | Disposition: A | Discharge status: Home / Self Care | Payer: Commercial Managed Care - HMO | Source: Ambulatory Visit | Attending: Radiation Oncology | Admitting: Radiation Oncology

## 2016-05-11 ENCOUNTER — Other Ambulatory Visit: Payer: Self-pay | Admitting: Internal Medicine

## 2016-05-11 ENCOUNTER — Encounter: Payer: Self-pay | Admitting: Radiation Oncology

## 2016-05-11 ENCOUNTER — Telehealth: Payer: Self-pay | Admitting: Cardiology

## 2016-05-11 ENCOUNTER — Ambulatory Visit: Payer: Commercial Managed Care - HMO

## 2016-05-11 ENCOUNTER — Telehealth: Payer: Self-pay | Admitting: *Deleted

## 2016-05-11 DIAGNOSIS — Z79899 Other long term (current) drug therapy: Secondary | ICD-10-CM

## 2016-05-11 DIAGNOSIS — C50919 Malignant neoplasm of unspecified site of unspecified female breast: Secondary | ICD-10-CM | POA: Diagnosis not present

## 2016-05-11 NOTE — Telephone Encounter (Signed)
Pt aware of her blood work, BMP ordered and lab slipi mail to pt

## 2016-05-11 NOTE — Telephone Encounter (Signed)
New Message  Pt returning RN call. Pt wanted to let RN know her pulse is 107 while sitting. Please call back to discuss

## 2016-05-11 NOTE — Telephone Encounter (Signed)
Discussed with patient. She is 1 lb up from yesterday (202) which is still below her dry weight goal. She notes no increased swelling, SOB, or other symptoms.  Note encounter on Wed 9/13 - I spoke to [place], her palliative care nurse, at that time to discuss considerations for care and med compliance.  Pt encouraged to keep on same dose of diuretic for now, to call or have her [facility] inform us of any concerning fluctuations in weight, and continue to monitor. Strongly encouraged patient to take her other meds as indicated (she has had some compliance issues w metoprolol over concern of low BPs, but she has caregivers including family who check her BP at home. I discussed calling us before initiating any changes to current regimen) Pt voiced understanding and thanks.

## 2016-05-11 NOTE — Telephone Encounter (Signed)
[facility]: pt heart rate is 107 today.  She is requesting verbal orders for swallowing therapy.  1 time per week for 4 weeks.

## 2016-05-11 NOTE — Telephone Encounter (Signed)
-----   Message from Minus Breeding, MD sent at 05/05/2016  7:20 PM EDT ----- Please have her take an extra 40 meq of KCL x 1 and repeat BMET in two weeks..  Please send result to the patient.  A copy of this result note will be sent to [name], DO

## 2016-05-11 NOTE — Telephone Encounter (Signed)
New message    Pt wants to know if the doctor wants to make any changes to her meds since she gained a pound.

## 2016-05-11 NOTE — Telephone Encounter (Signed)
Line busy when dialed. 

## 2016-05-12 ENCOUNTER — Telehealth: Payer: Self-pay | Admitting: Pulmonary Disease

## 2016-05-12 ENCOUNTER — Other Ambulatory Visit: Payer: Self-pay | Admitting: Internal Medicine

## 2016-05-12 NOTE — Telephone Encounter (Signed)
  Reason for call:   I received a call from [name] at 1:13 AM indicating she has diarrhea.   Pertinent Data:   Started today  She reports it is soft and loose. She reports that as soon as she stands up she has a BM.   Denies blood, melena  Denies nausea or vomiting. She has not been eating because she doesn't want to increase her diarrhea. She is able to drink and has been drinking water.  Has never had this happen before.   No sick contacts  Had some pasta salad and salmon recently - she thinks this is the cause.  Denies fevers or chills.  She reports she had antibiotics recently during her hospitalization at the end of September.   She denies lightheadedness with standing.    Assessment / Plan / Recommendations:   History consistent with acute GI infection resulting in diarrhea. Typically this is self limited and does not require antibiotics at this point. Instructed patient that if her symptoms fail to improve or worsen, or if she develops fevers, vomiting, abdominal pain that she needs to be seen sooner - consider checking for C diff given her recent hospitalization and antibiotic exposure.  Recommended OTC pepto bismol or similar and to try to stay hydrated.  As always, pt is advised that if symptoms worsen or new symptoms arise, they should go to an urgent care facility or to ER for further evaluation.   Anita Loll, MD   05/12/2016, 1:13 AM

## 2016-05-14 ENCOUNTER — Telehealth: Payer: Self-pay | Admitting: *Deleted

## 2016-05-14 NOTE — Telephone Encounter (Signed)
Called patient. No answer and no answer machine 

## 2016-05-14 NOTE — Telephone Encounter (Signed)
I will discuss her pain issue when I see her next time. Please let her know the lab result from 05-04-16, but Dr. Percival Spanish will interpret her lab and guilds her what to do. She does not need blood transfusion from her CBC 05-04-2016.   Thanks,  Truitt Merle MD

## 2016-05-14 NOTE — Telephone Encounter (Signed)
2nd attempt to call pt, no answer

## 2016-05-14 NOTE — Telephone Encounter (Signed)
"  Would you let Dr. Burr Medico know my knee is worse now after radiation.  I walk with a walker and use dilaudid for pain.  What were my lab results from Dr. Percival Spanish on 05-04-2016.  I also have a nurse from [place] who will be calling Dr. Percival Spanish about my loosing too much weight too quickly.  My husband and I weigh daily.  There's only a pound difference between home weight and office weights ad this morning I weighed 196.6. lbs"  Admits "One day of diarrhea on Friday because of something I ate.  No further loose stools since Friday.  I take a diuretic."  Encouraged to drink Gatorade amd fluids for hydration.

## 2016-05-15 NOTE — Telephone Encounter (Signed)
Left voicemail with verbal order.   Phill Myron, D.O. 05/15/2016, 3:13 PM PGY-2, [facility]

## 2016-05-16 ENCOUNTER — Ambulatory Visit: Payer: Commercial Managed Care - HMO | Admitting: Internal Medicine

## 2016-05-16 ENCOUNTER — Telehealth: Payer: Self-pay | Admitting: Internal Medicine

## 2016-05-21 ENCOUNTER — Other Ambulatory Visit: Payer: Commercial Managed Care - HMO

## 2016-05-21 ENCOUNTER — Encounter: Payer: Commercial Managed Care - HMO | Admitting: Hematology

## 2016-05-21 NOTE — Progress Notes (Signed)
Pt passed away last week, I spoke with her daughter and expressed my condolences.  This encounter was created in error - please disregard.

## 2016-05-21 NOTE — Telephone Encounter (Signed)
Learned today patient expired in her home 06/15/16.

## 2016-05-25 ENCOUNTER — Ambulatory Visit: Payer: Commercial Managed Care - HMO | Admitting: Pulmonary Disease

## 2016-05-27 NOTE — Telephone Encounter (Signed)
Care connection: tammy said she called pts house this morning and told pt passed away this morning.  tammy was just letting us know.

## 2016-05-27 NOTE — Telephone Encounter (Signed)
Checking to see if you had seen this request. Didn't know if you had given the verbal on this or not. Katharina Caper, April D, [place]

## 2016-05-27 DEATH — deceased

## 2016-05-29 NOTE — Progress Notes (Signed)
  Radiation Oncology         [phone] ________________________________  Name: Anita [name] MRN: [id number]  Date: 05/11/2016  DOB: 08-16-1946  End of Treatment Note    ICD-9-CM ICD-10-CM   1. Breast cancer metastasized to bone, unspecified laterality (HCC) 174.9 C50.919    198.5 C79.51     DIAGNOSIS:  Metastatic breast cancer with lytic osseous metastasis involving the left proximal tibia  Indication for treatment:  Painful osseous metastasis       Radiation treatment dates:   04/27/2016-05/11/2016  Site/dose:   Left proximal tibia: 30 Gy in 10 fractions  Beams/energy:   Isodose Plan // 6X Photon  Narrative: The patient tolerated radiation treatment relatively well. Main complaint was sharp pain behind both knees. She took 2 tablets of dilaudid per day. She had ace bandages wrapped around her ankles for swelling, that was done by her daughter who is a CNA. She continued to walk with a walker at home. Her blood sugar has been elevated since she has been taking prednisone. It was 226 on 05/08/16. She also reported fatigue.  Plan: The patient has completed radiation treatment. The patient will return to radiation oncology clinic for routine followup in one month. I advised them to call or return sooner if they have any questions or concerns related to their recovery or treatment.  -----------------------------------  Blair Promise, PhD, MD  This document serves as a record of services personally performed by Gery Pray, MD. It was created on his behalf by Darcus [place], a trained medical scribe. The creation of this record is based on the scribe's personal observations and the provider's statements to them. This document has been checked and approved by the attending provider.

## 2016-06-15 ENCOUNTER — Ambulatory Visit: Payer: Commercial Managed Care - HMO | Admitting: Physician Assistant

## 2016-09-01 IMAGING — DX DG CHEST 2V
2 series · 2 of 2 positions shown · non-contrast
Comparison: 01/20/2016

CLINICAL DATA: Midline chest pain and productive cough which
shortness-of-breath 2-3 weeks. COPD.

EXAM:
CHEST  2 VIEW

[chest lat]
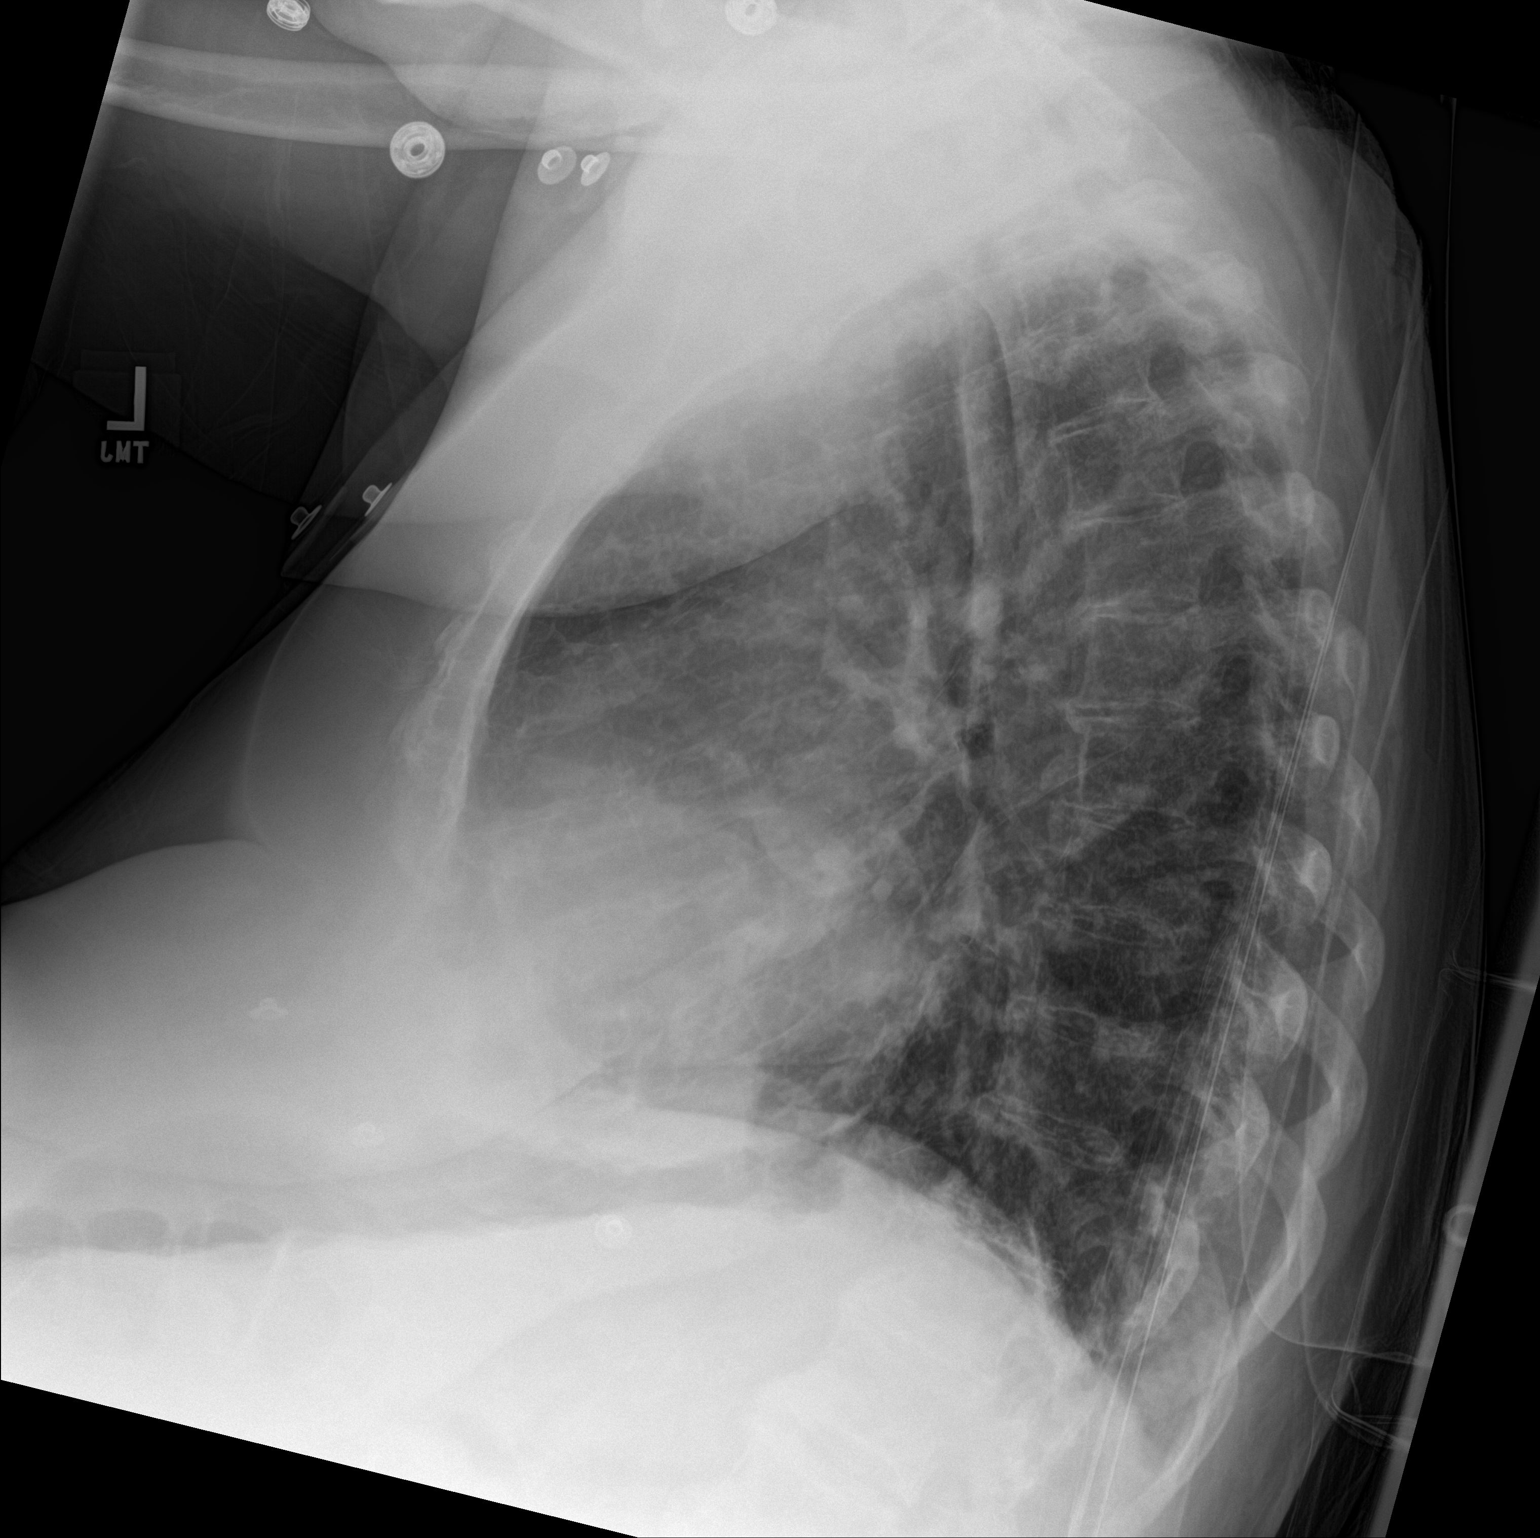

[chest ap]
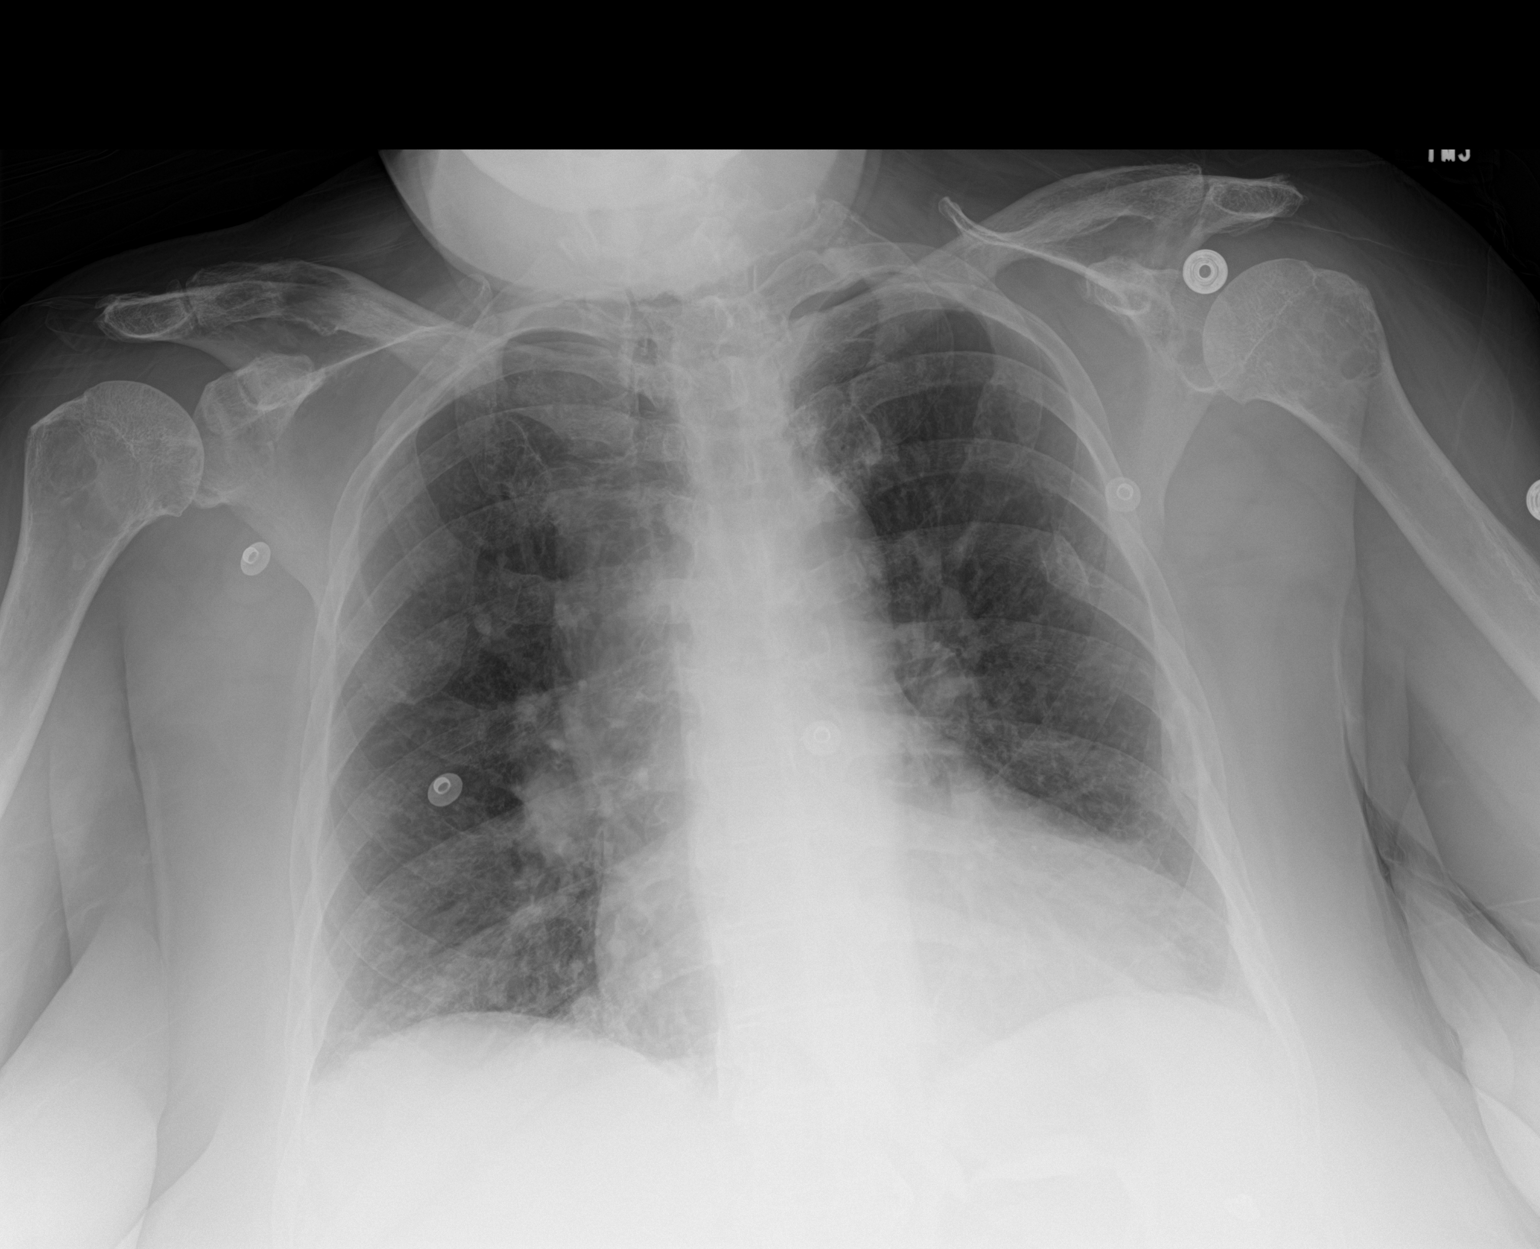

[2 of 2 positions shown; findings below may reference images not displayed]

FINDINGS: Lungs are adequately inflated without focal consolidation or
effusion. Minimal prominence of the right infrahilar bronchovascular
markings. Mild stable cardiomegaly. Minimal calcified plaque over
the aortic arch. There are mild degenerative changes of the spine.
IMPRESSION: Minimal prominence of the right infrahilar bronchovascular markings
which may be due to an acute bronchitic process.
